# Patient Record
Sex: Female | Born: 1955 | Race: White | Hispanic: No | Smoking: Former smoker
Health system: Southern US, Community
[De-identification: ages and names within clinical notes are randomized; demographics above are authoritative.]

## PROBLEM LIST (undated history)

## (undated) DIAGNOSIS — E785 Hyperlipidemia, unspecified: Secondary | ICD-10-CM

## (undated) DIAGNOSIS — Z8049 Family history of malignant neoplasm of other genital organs: Secondary | ICD-10-CM

## (undated) DIAGNOSIS — J449 Chronic obstructive pulmonary disease, unspecified: Secondary | ICD-10-CM

## (undated) DIAGNOSIS — E669 Obesity, unspecified: Secondary | ICD-10-CM

## (undated) DIAGNOSIS — C50919 Malignant neoplasm of unspecified site of unspecified female breast: Secondary | ICD-10-CM

## (undated) DIAGNOSIS — R51 Headache: Secondary | ICD-10-CM

## (undated) DIAGNOSIS — Z923 Personal history of irradiation: Secondary | ICD-10-CM

## (undated) DIAGNOSIS — E032 Hypothyroidism due to medicaments and other exogenous substances: Secondary | ICD-10-CM

## (undated) DIAGNOSIS — I5022 Chronic systolic (congestive) heart failure: Secondary | ICD-10-CM

## (undated) DIAGNOSIS — F329 Major depressive disorder, single episode, unspecified: Secondary | ICD-10-CM

## (undated) DIAGNOSIS — Z95810 Presence of automatic (implantable) cardiac defibrillator: Secondary | ICD-10-CM

## (undated) DIAGNOSIS — Z800 Family history of malignant neoplasm of digestive organs: Secondary | ICD-10-CM

## (undated) DIAGNOSIS — C50312 Malignant neoplasm of lower-inner quadrant of left female breast: Secondary | ICD-10-CM

## (undated) DIAGNOSIS — E119 Type 2 diabetes mellitus without complications: Secondary | ICD-10-CM

## (undated) DIAGNOSIS — K3184 Gastroparesis: Secondary | ICD-10-CM

## (undated) DIAGNOSIS — K219 Gastro-esophageal reflux disease without esophagitis: Secondary | ICD-10-CM

## (undated) DIAGNOSIS — I499 Cardiac arrhythmia, unspecified: Secondary | ICD-10-CM

## (undated) DIAGNOSIS — K589 Irritable bowel syndrome without diarrhea: Secondary | ICD-10-CM

## (undated) DIAGNOSIS — I219 Acute myocardial infarction, unspecified: Secondary | ICD-10-CM

## (undated) DIAGNOSIS — F32A Depression, unspecified: Secondary | ICD-10-CM

## (undated) DIAGNOSIS — I4819 Other persistent atrial fibrillation: Secondary | ICD-10-CM

## (undated) DIAGNOSIS — Z853 Personal history of malignant neoplasm of breast: Secondary | ICD-10-CM

## (undated) DIAGNOSIS — M1990 Unspecified osteoarthritis, unspecified site: Secondary | ICD-10-CM

## (undated) DIAGNOSIS — R519 Headache, unspecified: Secondary | ICD-10-CM

## (undated) DIAGNOSIS — L0292 Furuncle, unspecified: Secondary | ICD-10-CM

## (undated) DIAGNOSIS — I10 Essential (primary) hypertension: Secondary | ICD-10-CM

## (undated) DIAGNOSIS — R0600 Dyspnea, unspecified: Secondary | ICD-10-CM

## (undated) DIAGNOSIS — G2581 Restless legs syndrome: Secondary | ICD-10-CM

## (undated) DIAGNOSIS — Z950 Presence of cardiac pacemaker: Secondary | ICD-10-CM

## (undated) DIAGNOSIS — F419 Anxiety disorder, unspecified: Secondary | ICD-10-CM

## (undated) DIAGNOSIS — I509 Heart failure, unspecified: Secondary | ICD-10-CM

## (undated) DIAGNOSIS — G4733 Obstructive sleep apnea (adult) (pediatric): Secondary | ICD-10-CM

## (undated) DIAGNOSIS — R2232 Localized swelling, mass and lump, left upper limb: Principal | ICD-10-CM

## (undated) DIAGNOSIS — Z803 Family history of malignant neoplasm of breast: Secondary | ICD-10-CM

## (undated) HISTORY — DX: Hypothyroidism due to medicaments and other exogenous substances: E03.2

## (undated) HISTORY — PX: WRIST SURGERY: SHX841

## (undated) HISTORY — DX: Major depressive disorder, single episode, unspecified: F32.9

## (undated) HISTORY — DX: Furuncle, unspecified: L02.92

## (undated) HISTORY — DX: Anxiety disorder, unspecified: F41.9

## (undated) HISTORY — DX: Unspecified osteoarthritis, unspecified site: M19.90

## (undated) HISTORY — DX: Chronic systolic (congestive) heart failure: I50.22

## (undated) HISTORY — PX: CHOLECYSTECTOMY: SHX55

## (undated) HISTORY — DX: Hyperlipidemia, unspecified: E78.5

## (undated) HISTORY — DX: Depression, unspecified: F32.A

## (undated) HISTORY — DX: Obesity, unspecified: E66.9

## (undated) HISTORY — DX: Irritable bowel syndrome, unspecified: K58.9

## (undated) HISTORY — DX: Family history of malignant neoplasm of breast: Z80.3

## (undated) HISTORY — PX: EYE SURGERY: SHX253

## (undated) HISTORY — PX: MASTECTOMY: SHX3

## (undated) HISTORY — DX: Personal history of irradiation: Z92.3

## (undated) HISTORY — DX: Gastroparesis: K31.84

## (undated) HISTORY — DX: Essential (primary) hypertension: I10

## (undated) HISTORY — DX: Personal history of malignant neoplasm of breast: Z85.3

## (undated) HISTORY — DX: Malignant neoplasm of lower-inner quadrant of left female breast: C50.312

## (undated) HISTORY — DX: Family history of malignant neoplasm of other genital organs: Z80.49

## (undated) HISTORY — DX: Localized swelling, mass and lump, left upper limb: R22.32

## (undated) HISTORY — DX: Heart failure, unspecified: I50.9

## (undated) HISTORY — DX: Family history of malignant neoplasm of digestive organs: Z80.0

## (undated) HISTORY — DX: Type 2 diabetes mellitus without complications: E11.9

## (undated) HISTORY — DX: Obstructive sleep apnea (adult) (pediatric): G47.33

## (undated) HISTORY — PX: KNEE CARTILAGE SURGERY: SHX688

## (undated) HISTORY — DX: Restless legs syndrome: G25.81

## (undated) HISTORY — DX: Other persistent atrial fibrillation: I48.19

## (undated) HISTORY — DX: Gastro-esophageal reflux disease without esophagitis: K21.9

---

## 1989-08-05 HISTORY — PX: ABDOMINAL HYSTERECTOMY: SHX81

## 1996-08-05 LAB — HM COLONOSCOPY: HM Colonoscopy: NORMAL

## 1999-11-20 ENCOUNTER — Emergency Department (HOSPITAL_COMMUNITY): Admission: EM | Admit: 1999-11-20 | Discharge: 1999-11-20 | Payer: Self-pay | Admitting: Emergency Medicine

## 1999-11-20 ENCOUNTER — Encounter: Payer: Self-pay | Admitting: Emergency Medicine

## 2001-04-26 ENCOUNTER — Emergency Department (HOSPITAL_COMMUNITY): Admission: EM | Admit: 2001-04-26 | Discharge: 2001-04-26 | Payer: Self-pay | Admitting: Emergency Medicine

## 2001-05-23 ENCOUNTER — Emergency Department (HOSPITAL_COMMUNITY): Admission: EM | Admit: 2001-05-23 | Discharge: 2001-05-23 | Payer: Self-pay | Admitting: Emergency Medicine

## 2001-05-29 ENCOUNTER — Ambulatory Visit (HOSPITAL_COMMUNITY): Admission: RE | Admit: 2001-05-29 | Discharge: 2001-05-30 | Payer: Self-pay | Admitting: *Deleted

## 2001-05-29 ENCOUNTER — Encounter (INDEPENDENT_AMBULATORY_CARE_PROVIDER_SITE_OTHER): Payer: Self-pay | Admitting: *Deleted

## 2002-07-11 ENCOUNTER — Emergency Department (HOSPITAL_COMMUNITY): Admission: EM | Admit: 2002-07-11 | Discharge: 2002-07-11 | Payer: Self-pay | Admitting: Emergency Medicine

## 2002-07-11 ENCOUNTER — Encounter: Payer: Self-pay | Admitting: Emergency Medicine

## 2002-08-11 ENCOUNTER — Other Ambulatory Visit: Admission: RE | Admit: 2002-08-11 | Discharge: 2002-08-11 | Payer: Self-pay | Admitting: Obstetrics and Gynecology

## 2004-05-28 ENCOUNTER — Other Ambulatory Visit: Admission: RE | Admit: 2004-05-28 | Discharge: 2004-05-28 | Payer: Self-pay

## 2004-06-13 ENCOUNTER — Ambulatory Visit (HOSPITAL_COMMUNITY): Admission: RE | Admit: 2004-06-13 | Discharge: 2004-06-13 | Payer: Self-pay

## 2004-06-13 ENCOUNTER — Encounter (INDEPENDENT_AMBULATORY_CARE_PROVIDER_SITE_OTHER): Payer: Self-pay | Admitting: *Deleted

## 2004-06-13 ENCOUNTER — Encounter (INDEPENDENT_AMBULATORY_CARE_PROVIDER_SITE_OTHER): Payer: Self-pay

## 2004-06-15 ENCOUNTER — Ambulatory Visit: Payer: Self-pay | Admitting: Oncology

## 2004-07-02 ENCOUNTER — Encounter (HOSPITAL_COMMUNITY): Admission: RE | Admit: 2004-07-02 | Discharge: 2004-09-30 | Payer: Self-pay | Admitting: Oncology

## 2004-07-04 ENCOUNTER — Ambulatory Visit (HOSPITAL_COMMUNITY): Admission: RE | Admit: 2004-07-04 | Discharge: 2004-07-04 | Payer: Self-pay | Admitting: Oncology

## 2004-07-04 ENCOUNTER — Ambulatory Visit: Payer: Self-pay

## 2004-07-05 ENCOUNTER — Encounter: Admission: RE | Admit: 2004-07-05 | Discharge: 2004-07-05 | Payer: Self-pay | Admitting: Oncology

## 2004-07-10 ENCOUNTER — Ambulatory Visit: Admission: RE | Admit: 2004-07-10 | Discharge: 2004-08-10 | Payer: Self-pay | Admitting: Radiation Oncology

## 2004-07-20 ENCOUNTER — Ambulatory Visit (HOSPITAL_COMMUNITY): Admission: RE | Admit: 2004-07-20 | Discharge: 2004-07-20 | Payer: Self-pay

## 2004-08-02 ENCOUNTER — Ambulatory Visit: Payer: Self-pay | Admitting: Oncology

## 2004-08-05 DIAGNOSIS — Z853 Personal history of malignant neoplasm of breast: Secondary | ICD-10-CM

## 2004-08-05 HISTORY — DX: Personal history of malignant neoplasm of breast: Z85.3

## 2004-09-19 ENCOUNTER — Ambulatory Visit: Payer: Self-pay | Admitting: Oncology

## 2004-11-07 ENCOUNTER — Ambulatory Visit: Payer: Self-pay | Admitting: Oncology

## 2004-11-27 ENCOUNTER — Ambulatory Visit: Admission: RE | Admit: 2004-11-27 | Discharge: 2005-02-21 | Payer: Self-pay | Admitting: Radiation Oncology

## 2004-12-27 ENCOUNTER — Ambulatory Visit: Payer: Self-pay | Admitting: Oncology

## 2005-02-12 ENCOUNTER — Ambulatory Visit: Payer: Self-pay | Admitting: Oncology

## 2005-02-17 ENCOUNTER — Ambulatory Visit: Payer: Self-pay | Admitting: Cardiology

## 2005-02-17 ENCOUNTER — Inpatient Hospital Stay (HOSPITAL_COMMUNITY): Admission: EM | Admit: 2005-02-17 | Discharge: 2005-02-21 | Payer: Self-pay | Admitting: Emergency Medicine

## 2005-02-18 ENCOUNTER — Ambulatory Visit: Payer: Self-pay | Admitting: Cardiology

## 2005-02-18 ENCOUNTER — Encounter: Payer: Self-pay | Admitting: Cardiology

## 2005-02-28 ENCOUNTER — Ambulatory Visit: Payer: Self-pay | Admitting: Internal Medicine

## 2005-03-07 ENCOUNTER — Ambulatory Visit: Payer: Self-pay | Admitting: Internal Medicine

## 2005-03-14 ENCOUNTER — Ambulatory Visit: Payer: Self-pay | Admitting: Internal Medicine

## 2005-03-20 ENCOUNTER — Ambulatory Visit: Payer: Self-pay

## 2005-03-26 ENCOUNTER — Ambulatory Visit: Payer: Self-pay | Admitting: Internal Medicine

## 2005-04-16 ENCOUNTER — Ambulatory Visit: Payer: Self-pay | Admitting: Cardiology

## 2005-05-02 ENCOUNTER — Ambulatory Visit: Payer: Self-pay | Admitting: Cardiology

## 2005-05-15 ENCOUNTER — Ambulatory Visit: Payer: Self-pay | Admitting: Internal Medicine

## 2005-05-22 ENCOUNTER — Ambulatory Visit: Payer: Self-pay | Admitting: Cardiology

## 2005-06-17 ENCOUNTER — Ambulatory Visit: Payer: Self-pay | Admitting: Pulmonary Disease

## 2005-07-01 ENCOUNTER — Ambulatory Visit: Payer: Self-pay | Admitting: Internal Medicine

## 2005-07-10 ENCOUNTER — Ambulatory Visit (HOSPITAL_BASED_OUTPATIENT_CLINIC_OR_DEPARTMENT_OTHER): Admission: RE | Admit: 2005-07-10 | Discharge: 2005-07-10 | Payer: Self-pay | Admitting: Pulmonary Disease

## 2005-07-12 ENCOUNTER — Ambulatory Visit: Payer: Self-pay | Admitting: Oncology

## 2005-07-22 ENCOUNTER — Ambulatory Visit: Payer: Self-pay | Admitting: Pulmonary Disease

## 2005-07-26 ENCOUNTER — Ambulatory Visit: Payer: Self-pay | Admitting: Internal Medicine

## 2005-08-07 ENCOUNTER — Ambulatory Visit: Payer: Self-pay | Admitting: Internal Medicine

## 2005-08-30 ENCOUNTER — Ambulatory Visit: Payer: Self-pay | Admitting: Pulmonary Disease

## 2005-09-03 ENCOUNTER — Ambulatory Visit: Payer: Self-pay | Admitting: Cardiology

## 2005-09-17 ENCOUNTER — Ambulatory Visit: Payer: Self-pay | Admitting: Cardiology

## 2005-09-26 ENCOUNTER — Inpatient Hospital Stay (HOSPITAL_COMMUNITY): Admission: EM | Admit: 2005-09-26 | Discharge: 2005-09-27 | Payer: Self-pay | Admitting: Emergency Medicine

## 2005-10-11 ENCOUNTER — Ambulatory Visit: Payer: Self-pay | Admitting: Cardiology

## 2005-10-14 ENCOUNTER — Ambulatory Visit: Payer: Self-pay | Admitting: Pulmonary Disease

## 2005-11-01 ENCOUNTER — Ambulatory Visit: Payer: Self-pay | Admitting: Oncology

## 2005-11-04 ENCOUNTER — Ambulatory Visit: Payer: Self-pay | Admitting: Cardiology

## 2005-11-04 ENCOUNTER — Ambulatory Visit: Payer: Self-pay

## 2005-11-20 LAB — COMPREHENSIVE METABOLIC PANEL
ALT: 21 U/L (ref 0–40)
AST: 23 U/L (ref 0–37)
Albumin: 4.2 g/dL (ref 3.5–5.2)
Alkaline Phosphatase: 72 U/L (ref 39–117)
Potassium: 4.1 mEq/L (ref 3.5–5.3)
Sodium: 139 mEq/L (ref 135–145)
Total Protein: 7.1 g/dL (ref 6.0–8.3)

## 2005-11-20 LAB — CBC WITH DIFFERENTIAL/PLATELET
BASO%: 0.5 % (ref 0.0–2.0)
EOS%: 2.1 % (ref 0.0–7.0)
Eosinophils Absolute: 0.2 10*3/uL (ref 0.0–0.5)
MCH: 29.1 pg (ref 26.0–34.0)
MCHC: 33.1 g/dL (ref 32.0–36.0)
MCV: 87.8 fL (ref 81.0–101.0)
MONO%: 5.2 % (ref 0.0–13.0)
NEUT#: 5.2 10*3/uL (ref 1.5–6.5)
RBC: 4.43 10*6/uL (ref 3.70–5.32)
RDW: 15.5 % — ABNORMAL HIGH (ref 11.3–14.5)

## 2005-11-29 ENCOUNTER — Ambulatory Visit: Payer: Self-pay

## 2005-12-09 ENCOUNTER — Ambulatory Visit: Payer: Self-pay | Admitting: Internal Medicine

## 2005-12-16 ENCOUNTER — Ambulatory Visit: Payer: Self-pay | Admitting: Internal Medicine

## 2006-01-03 ENCOUNTER — Ambulatory Visit: Payer: Self-pay | Admitting: Cardiology

## 2006-03-10 ENCOUNTER — Ambulatory Visit: Payer: Self-pay | Admitting: Oncology

## 2006-03-12 LAB — CBC WITH DIFFERENTIAL/PLATELET
BASO%: 0.5 % (ref 0.0–2.0)
Basophils Absolute: 0 10*3/uL (ref 0.0–0.1)
EOS%: 2.1 % (ref 0.0–7.0)
MCH: 29.5 pg (ref 26.0–34.0)
MCHC: 34.4 g/dL (ref 32.0–36.0)
MCV: 85.7 fL (ref 81.0–101.0)
MONO%: 5.9 % (ref 0.0–13.0)
RBC: 4.44 10*6/uL (ref 3.70–5.32)
RDW: 15.6 % — ABNORMAL HIGH (ref 11.3–14.5)
lymph#: 1.8 10*3/uL (ref 0.9–3.3)

## 2006-03-12 LAB — COMPREHENSIVE METABOLIC PANEL
ALT: 20 U/L (ref 0–40)
AST: 21 U/L (ref 0–37)
Albumin: 3.8 g/dL (ref 3.5–5.2)
Alkaline Phosphatase: 76 U/L (ref 39–117)
BUN: 17 mg/dL (ref 6–23)
Chloride: 99 mEq/L (ref 96–112)
Potassium: 4.1 mEq/L (ref 3.5–5.3)
Sodium: 138 mEq/L (ref 135–145)

## 2006-06-03 ENCOUNTER — Ambulatory Visit: Payer: Self-pay | Admitting: Pulmonary Disease

## 2006-07-02 ENCOUNTER — Ambulatory Visit: Payer: Self-pay | Admitting: Oncology

## 2006-07-06 ENCOUNTER — Observation Stay (HOSPITAL_COMMUNITY): Admission: EM | Admit: 2006-07-06 | Discharge: 2006-07-06 | Payer: Self-pay | Admitting: Emergency Medicine

## 2006-07-06 ENCOUNTER — Ambulatory Visit: Payer: Self-pay | Admitting: *Deleted

## 2006-07-07 LAB — COMPREHENSIVE METABOLIC PANEL
ALT: 28 U/L (ref 0–35)
AST: 27 U/L (ref 0–37)
Albumin: 4 g/dL (ref 3.5–5.2)
CO2: 23 mEq/L (ref 19–32)
Calcium: 9 mg/dL (ref 8.4–10.5)
Chloride: 104 mEq/L (ref 96–112)
Creatinine, Ser: 0.69 mg/dL (ref 0.40–1.20)
Potassium: 3.8 mEq/L (ref 3.5–5.3)
Total Protein: 6.8 g/dL (ref 6.0–8.3)

## 2006-07-07 LAB — CBC WITH DIFFERENTIAL/PLATELET
BASO%: 0.8 % (ref 0.0–2.0)
Basophils Absolute: 0.1 10*3/uL (ref 0.0–0.1)
EOS%: 2.3 % (ref 0.0–7.0)
HGB: 13.2 g/dL (ref 11.6–15.9)
MCH: 29.6 pg (ref 26.0–34.0)
MCHC: 34.4 g/dL (ref 32.0–36.0)
MCV: 86.2 fL (ref 81.0–101.0)
MONO%: 6.3 % (ref 0.0–13.0)
NEUT%: 58.5 % (ref 39.6–76.8)
RDW: 13.1 % (ref 11.3–14.5)
lymph#: 2.2 10*3/uL (ref 0.9–3.3)

## 2006-07-07 LAB — LACTATE DEHYDROGENASE: LDH: 138 U/L (ref 94–250)

## 2006-07-10 ENCOUNTER — Ambulatory Visit: Payer: Self-pay | Admitting: Cardiology

## 2006-07-15 ENCOUNTER — Encounter: Admission: RE | Admit: 2006-07-15 | Discharge: 2006-07-15 | Payer: Self-pay | Admitting: Oncology

## 2006-07-15 ENCOUNTER — Ambulatory Visit (HOSPITAL_COMMUNITY): Admission: RE | Admit: 2006-07-15 | Discharge: 2006-07-15 | Payer: Self-pay | Admitting: Oncology

## 2006-07-23 ENCOUNTER — Ambulatory Visit: Payer: Self-pay

## 2006-07-31 ENCOUNTER — Encounter: Admission: RE | Admit: 2006-07-31 | Discharge: 2006-07-31 | Payer: Self-pay | Admitting: Oncology

## 2006-08-11 ENCOUNTER — Encounter (INDEPENDENT_AMBULATORY_CARE_PROVIDER_SITE_OTHER): Payer: Self-pay | Admitting: Diagnostic Radiology

## 2006-08-11 ENCOUNTER — Encounter: Admission: RE | Admit: 2006-08-11 | Discharge: 2006-08-11 | Payer: Self-pay | Admitting: Oncology

## 2006-08-11 ENCOUNTER — Encounter (INDEPENDENT_AMBULATORY_CARE_PROVIDER_SITE_OTHER): Payer: Self-pay | Admitting: Specialist

## 2006-08-27 ENCOUNTER — Ambulatory Visit (HOSPITAL_COMMUNITY): Admission: RE | Admit: 2006-08-27 | Discharge: 2006-08-28 | Payer: Self-pay | Admitting: General Surgery

## 2006-08-27 ENCOUNTER — Encounter (INDEPENDENT_AMBULATORY_CARE_PROVIDER_SITE_OTHER): Payer: Self-pay | Admitting: Specialist

## 2006-09-08 ENCOUNTER — Ambulatory Visit: Payer: Self-pay | Admitting: Cardiology

## 2006-09-15 ENCOUNTER — Ambulatory Visit: Payer: Self-pay | Admitting: Oncology

## 2006-09-17 LAB — CBC WITH DIFFERENTIAL/PLATELET
BASO%: 0.8 % (ref 0.0–2.0)
Basophils Absolute: 0.1 10*3/uL (ref 0.0–0.1)
EOS%: 4.7 % (ref 0.0–7.0)
HCT: 34 % — ABNORMAL LOW (ref 34.8–46.6)
HGB: 11.9 g/dL (ref 11.6–15.9)
LYMPH%: 26.9 % (ref 14.0–48.0)
MCH: 29.8 pg (ref 26.0–34.0)
MCHC: 35 g/dL (ref 32.0–36.0)
NEUT%: 61.1 % (ref 39.6–76.8)
Platelets: 326 10*3/uL (ref 145–400)

## 2006-09-17 LAB — COMPREHENSIVE METABOLIC PANEL
AST: 14 U/L (ref 0–37)
Albumin: 3.8 g/dL (ref 3.5–5.2)
Alkaline Phosphatase: 85 U/L (ref 39–117)
BUN: 18 mg/dL (ref 6–23)
Calcium: 9.2 mg/dL (ref 8.4–10.5)
Chloride: 101 mEq/L (ref 96–112)
Glucose, Bld: 154 mg/dL — ABNORMAL HIGH (ref 70–99)
Potassium: 4 mEq/L (ref 3.5–5.3)
Sodium: 138 mEq/L (ref 135–145)
Total Protein: 6.8 g/dL (ref 6.0–8.3)

## 2006-09-17 LAB — LACTATE DEHYDROGENASE: LDH: 106 U/L (ref 94–250)

## 2006-09-29 ENCOUNTER — Ambulatory Visit (HOSPITAL_COMMUNITY): Admission: RE | Admit: 2006-09-29 | Discharge: 2006-09-29 | Payer: Self-pay | Admitting: Oncology

## 2006-09-30 ENCOUNTER — Ambulatory Visit (HOSPITAL_COMMUNITY): Admission: RE | Admit: 2006-09-30 | Discharge: 2006-09-30 | Payer: Self-pay | Admitting: Oncology

## 2006-10-02 LAB — CBC WITH DIFFERENTIAL/PLATELET
BASO%: 0.7 % (ref 0.0–2.0)
EOS%: 4.3 % (ref 0.0–7.0)
HCT: 35.1 % (ref 34.8–46.6)
LYMPH%: 33.9 % (ref 14.0–48.0)
MCH: 29.2 pg (ref 26.0–34.0)
MCHC: 34.5 g/dL (ref 32.0–36.0)
MONO#: 0.5 10*3/uL (ref 0.1–0.9)
NEUT%: 53.6 % (ref 39.6–76.8)
Platelets: 301 10*3/uL (ref 145–400)
RBC: 4.14 10*6/uL (ref 3.70–5.32)
WBC: 6.8 10*3/uL (ref 3.9–10.0)

## 2006-10-15 LAB — CBC WITH DIFFERENTIAL/PLATELET
BASO%: 0.5 % (ref 0.0–2.0)
EOS%: 2.6 % (ref 0.0–7.0)
MCH: 28.7 pg (ref 26.0–34.0)
MCHC: 34.1 g/dL (ref 32.0–36.0)
MONO#: 0.5 10*3/uL (ref 0.1–0.9)
NEUT%: 63.5 % (ref 39.6–76.8)
RBC: 4.07 10*6/uL (ref 3.70–5.32)
RDW: 14.9 % — ABNORMAL HIGH (ref 11.3–14.5)
WBC: 7.8 10*3/uL (ref 3.9–10.0)
lymph#: 2.1 10*3/uL (ref 0.9–3.3)

## 2006-10-23 ENCOUNTER — Ambulatory Visit: Payer: Self-pay | Admitting: Cardiology

## 2006-10-23 LAB — CONVERTED CEMR LAB
BUN: 11 mg/dL (ref 6–23)
CO2: 31 meq/L (ref 19–32)
Chloride: 99 meq/L (ref 96–112)
Creatinine, Ser: 0.6 mg/dL (ref 0.4–1.2)
GFR calc Af Amer: 136 mL/min
GFR calc non Af Amer: 112 mL/min
Glucose, Bld: 114 mg/dL — ABNORMAL HIGH (ref 70–99)
Potassium: 3.7 meq/L (ref 3.5–5.1)
Pro B Natriuretic peptide (BNP): 78 pg/mL (ref 0.0–100.0)
Sodium: 136 meq/L (ref 135–145)

## 2006-10-24 LAB — WOUND CULTURE

## 2006-10-29 ENCOUNTER — Ambulatory Visit: Payer: Self-pay | Admitting: Oncology

## 2006-11-04 LAB — CBC WITH DIFFERENTIAL/PLATELET
Basophils Absolute: 0 10*3/uL (ref 0.0–0.1)
Eosinophils Absolute: 0.2 10*3/uL (ref 0.0–0.5)
HCT: 35.1 % (ref 34.8–46.6)
HGB: 12.1 g/dL (ref 11.6–15.9)
LYMPH%: 23.1 % (ref 14.0–48.0)
MCV: 83.5 fL (ref 81.0–101.0)
MONO#: 0.5 10*3/uL (ref 0.1–0.9)
MONO%: 6.6 % (ref 0.0–13.0)
NEUT#: 5 10*3/uL (ref 1.5–6.5)
Platelets: 248 10*3/uL (ref 145–400)
RBC: 4.2 10*6/uL (ref 3.70–5.32)
WBC: 7.3 10*3/uL (ref 3.9–10.0)

## 2006-12-03 LAB — COMPREHENSIVE METABOLIC PANEL
Alkaline Phosphatase: 86 U/L (ref 39–117)
CO2: 24 mEq/L (ref 19–32)
Creatinine, Ser: 0.55 mg/dL (ref 0.40–1.20)
Glucose, Bld: 128 mg/dL — ABNORMAL HIGH (ref 70–99)
Total Bilirubin: 0.4 mg/dL (ref 0.3–1.2)

## 2006-12-03 LAB — CBC WITH DIFFERENTIAL/PLATELET
BASO%: 0.7 % (ref 0.0–2.0)
Eosinophils Absolute: 0.2 10*3/uL (ref 0.0–0.5)
HCT: 34.8 % (ref 34.8–46.6)
LYMPH%: 31 % (ref 14.0–48.0)
MCHC: 34.4 g/dL (ref 32.0–36.0)
MCV: 82.8 fL (ref 81.0–101.0)
MONO#: 0.7 10*3/uL (ref 0.1–0.9)
MONO%: 8.4 % (ref 0.0–13.0)
NEUT%: 57.7 % (ref 39.6–76.8)
Platelets: 270 10*3/uL (ref 145–400)
WBC: 8.1 10*3/uL (ref 3.9–10.0)

## 2006-12-03 LAB — CANCER ANTIGEN 27.29: CA 27.29: 5 U/mL (ref 0–39)

## 2006-12-03 LAB — LACTATE DEHYDROGENASE: LDH: 108 U/L (ref 94–250)

## 2006-12-22 ENCOUNTER — Ambulatory Visit: Payer: Self-pay | Admitting: Internal Medicine

## 2006-12-23 LAB — CONVERTED CEMR LAB
BUN: 14 mg/dL (ref 6–23)
CO2: 31 meq/L (ref 19–32)
Calcium: 9.4 mg/dL (ref 8.4–10.5)
Eosinophils Absolute: 0.2 10*3/uL (ref 0.0–0.6)
Free T4: 0.6 ng/dL (ref 0.6–1.6)
GFR calc Af Amer: 113 mL/min
GFR calc non Af Amer: 94 mL/min
HCT: 37.7 % (ref 36.0–46.0)
Hemoglobin: 12.7 g/dL (ref 12.0–15.0)
Lymphocytes Relative: 29.6 % (ref 12.0–46.0)
MCHC: 33.7 g/dL (ref 30.0–36.0)
Monocytes Relative: 3.5 % (ref 3.0–11.0)
Neutro Abs: 5.2 10*3/uL (ref 1.4–7.7)
Neutrophils Relative %: 63.7 % (ref 43.0–77.0)
Pro B Natriuretic peptide (BNP): 40 pg/mL (ref 0.0–100.0)
RBC: 4.45 M/uL (ref 3.87–5.11)
RDW: 16.4 % — ABNORMAL HIGH (ref 11.5–14.6)
Sodium: 139 meq/L (ref 135–145)
WBC: 8.1 10*3/uL (ref 4.5–10.5)

## 2007-01-09 ENCOUNTER — Encounter: Payer: Self-pay | Admitting: Internal Medicine

## 2007-01-09 ENCOUNTER — Ambulatory Visit: Payer: Self-pay

## 2007-01-23 ENCOUNTER — Ambulatory Visit: Payer: Self-pay | Admitting: Internal Medicine

## 2007-02-26 ENCOUNTER — Ambulatory Visit: Payer: Self-pay | Admitting: Oncology

## 2007-03-02 LAB — COMPREHENSIVE METABOLIC PANEL
Albumin: 3.8 g/dL (ref 3.5–5.2)
BUN: 13 mg/dL (ref 6–23)
CO2: 23 mEq/L (ref 19–32)
Calcium: 9.1 mg/dL (ref 8.4–10.5)
Chloride: 99 mEq/L (ref 96–112)
Glucose, Bld: 330 mg/dL — ABNORMAL HIGH (ref 70–99)
Potassium: 4.1 mEq/L (ref 3.5–5.3)
Sodium: 134 mEq/L — ABNORMAL LOW (ref 135–145)
Total Protein: 6.8 g/dL (ref 6.0–8.3)

## 2007-03-02 LAB — CBC WITH DIFFERENTIAL/PLATELET
Basophils Absolute: 0 10*3/uL (ref 0.0–0.1)
Eosinophils Absolute: 0.1 10*3/uL (ref 0.0–0.5)
HGB: 11.7 g/dL (ref 11.6–15.9)
MCV: 83.9 fL (ref 81.0–101.0)
MONO#: 0.3 10*3/uL (ref 0.1–0.9)
MONO%: 5.4 % (ref 0.0–13.0)
NEUT#: 3.5 10*3/uL (ref 1.5–6.5)
RBC: 4.09 10*6/uL (ref 3.70–5.32)
RDW: 16.3 % — ABNORMAL HIGH (ref 11.3–14.5)
WBC: 5.4 10*3/uL (ref 3.9–10.0)
lymph#: 1.5 10*3/uL (ref 0.9–3.3)

## 2007-03-02 LAB — CANCER ANTIGEN 27.29: CA 27.29: 6 U/mL (ref 0–39)

## 2007-03-02 LAB — LACTATE DEHYDROGENASE: LDH: 116 U/L (ref 94–250)

## 2007-03-05 ENCOUNTER — Ambulatory Visit: Payer: Self-pay | Admitting: Internal Medicine

## 2007-03-05 LAB — CONVERTED CEMR LAB
GFR calc Af Amer: 136 mL/min
GFR calc non Af Amer: 112 mL/min
Sodium: 135 meq/L (ref 135–145)

## 2007-03-11 ENCOUNTER — Ambulatory Visit: Payer: Self-pay

## 2007-03-11 ENCOUNTER — Ambulatory Visit: Payer: Self-pay | Admitting: Internal Medicine

## 2007-03-11 LAB — CONVERTED CEMR LAB
BUN: 10 mg/dL (ref 6–23)
BUN: 11 mg/dL (ref 6–23)
CO2: 26 meq/L (ref 19–32)
CO2: 27 meq/L (ref 19–32)
Calcium: 9.2 mg/dL (ref 8.4–10.5)
Calcium: 10 mg/dL (ref 8.4–10.5)
Creatinine, Ser: 0.6 mg/dL (ref 0.4–1.2)
Creatinine, Ser: 0.6 mg/dL (ref 0.4–1.2)
GFR calc Af Amer: 136 mL/min
Glucose, Bld: 248 mg/dL — ABNORMAL HIGH (ref 70–99)
Pro B Natriuretic peptide (BNP): 104 pg/mL — ABNORMAL HIGH (ref 0.0–100.0)
Sodium: 135 meq/L (ref 135–145)
Sodium: 143 meq/L (ref 135–145)
TSH: 4.08 microintl units/mL (ref 0.35–5.50)

## 2007-03-17 ENCOUNTER — Ambulatory Visit: Payer: Self-pay | Admitting: Oncology

## 2007-04-21 ENCOUNTER — Ambulatory Visit: Payer: Self-pay | Admitting: Internal Medicine

## 2007-04-29 LAB — COMPREHENSIVE METABOLIC PANEL
AST: 39 U/L — ABNORMAL HIGH (ref 0–37)
BUN: 13 mg/dL (ref 6–23)
Calcium: 9.3 mg/dL (ref 8.4–10.5)
Chloride: 101 mEq/L (ref 96–112)
Creatinine, Ser: 0.75 mg/dL (ref 0.40–1.20)
Total Bilirubin: 0.4 mg/dL (ref 0.3–1.2)

## 2007-04-29 LAB — CBC WITH DIFFERENTIAL/PLATELET
BASO%: 0.4 % (ref 0.0–2.0)
EOS%: 2.5 % (ref 0.0–7.0)
HCT: 36.1 % (ref 34.8–46.6)
MCH: 28.9 pg (ref 26.0–34.0)
MCHC: 34.5 g/dL (ref 32.0–36.0)
MONO#: 0.4 10*3/uL (ref 0.1–0.9)
NEUT%: 63.8 % (ref 39.6–76.8)
RBC: 4.3 10*6/uL (ref 3.70–5.32)
WBC: 6.9 10*3/uL (ref 3.9–10.0)
lymph#: 1.9 10*3/uL (ref 0.9–3.3)

## 2007-06-22 ENCOUNTER — Ambulatory Visit: Payer: Self-pay | Admitting: Cardiology

## 2007-06-25 ENCOUNTER — Ambulatory Visit (HOSPITAL_COMMUNITY): Admission: RE | Admit: 2007-06-25 | Discharge: 2007-06-25 | Payer: Self-pay | Admitting: Oncology

## 2007-06-25 ENCOUNTER — Ambulatory Visit: Payer: Self-pay | Admitting: Oncology

## 2007-06-29 LAB — CBC WITH DIFFERENTIAL/PLATELET
Eosinophils Absolute: 0.2 10*3/uL (ref 0.0–0.5)
HGB: 12.5 g/dL (ref 11.6–15.9)
LYMPH%: 24.6 % (ref 14.0–48.0)
MONO#: 0.4 10*3/uL (ref 0.1–0.9)
NEUT#: 5.2 10*3/uL (ref 1.5–6.5)
Platelets: 290 10*3/uL (ref 145–400)
RBC: 4.37 10*6/uL (ref 3.70–5.32)
RDW: 15.9 % — ABNORMAL HIGH (ref 11.3–14.5)
WBC: 7.7 10*3/uL (ref 3.9–10.0)

## 2007-06-29 LAB — COMPREHENSIVE METABOLIC PANEL
Albumin: 4.1 g/dL (ref 3.5–5.2)
CO2: 24 mEq/L (ref 19–32)
Glucose, Bld: 204 mg/dL — ABNORMAL HIGH (ref 70–99)
Potassium: 4 mEq/L (ref 3.5–5.3)
Sodium: 136 mEq/L (ref 135–145)
Total Protein: 7.2 g/dL (ref 6.0–8.3)

## 2007-06-29 LAB — LACTATE DEHYDROGENASE: LDH: 125 U/L (ref 94–250)

## 2007-07-24 ENCOUNTER — Encounter: Admission: RE | Admit: 2007-07-24 | Discharge: 2007-07-24 | Payer: Self-pay | Admitting: Oncology

## 2007-07-24 DIAGNOSIS — E669 Obesity, unspecified: Secondary | ICD-10-CM

## 2007-07-24 DIAGNOSIS — G4733 Obstructive sleep apnea (adult) (pediatric): Secondary | ICD-10-CM

## 2007-07-24 DIAGNOSIS — G2581 Restless legs syndrome: Secondary | ICD-10-CM | POA: Insufficient documentation

## 2007-07-24 HISTORY — DX: Obstructive sleep apnea (adult) (pediatric): G47.33

## 2007-07-24 HISTORY — DX: Obesity, unspecified: E66.9

## 2007-07-24 HISTORY — DX: Restless legs syndrome: G25.81

## 2007-08-10 ENCOUNTER — Ambulatory Visit: Payer: Self-pay | Admitting: Cardiology

## 2007-08-10 LAB — CONVERTED CEMR LAB
BUN: 13 mg/dL (ref 6–23)
CO2: 30 meq/L (ref 19–32)
Calcium: 9.2 mg/dL (ref 8.4–10.5)
Chloride: 101 meq/L (ref 96–112)
Creatinine, Ser: 0.5 mg/dL (ref 0.4–1.2)
GFR calc Af Amer: 167 mL/min
GFR calc non Af Amer: 138 mL/min
Glucose, Bld: 221 mg/dL — ABNORMAL HIGH (ref 70–99)
Pro B Natriuretic peptide (BNP): 134 pg/mL — ABNORMAL HIGH (ref 0.0–100.0)

## 2007-08-13 ENCOUNTER — Ambulatory Visit: Payer: Self-pay | Admitting: Endocrinology

## 2007-08-13 DIAGNOSIS — E119 Type 2 diabetes mellitus without complications: Secondary | ICD-10-CM

## 2007-08-13 HISTORY — DX: Type 2 diabetes mellitus without complications: E11.9

## 2007-09-07 ENCOUNTER — Ambulatory Visit: Payer: Self-pay | Admitting: Internal Medicine

## 2007-09-16 ENCOUNTER — Ambulatory Visit: Payer: Self-pay | Admitting: Endocrinology

## 2007-09-18 ENCOUNTER — Ambulatory Visit: Payer: Self-pay | Admitting: Pulmonary Disease

## 2007-10-08 ENCOUNTER — Ambulatory Visit: Payer: Self-pay | Admitting: Internal Medicine

## 2007-10-08 LAB — CONVERTED CEMR LAB
Basophils Relative: 0.7 % (ref 0.0–1.0)
CO2: 32 meq/L (ref 19–32)
Calcium: 9.6 mg/dL (ref 8.4–10.5)
Creatinine, Ser: 0.7 mg/dL (ref 0.4–1.2)
Eosinophils Absolute: 0.2 10*3/uL (ref 0.0–0.6)
GFR calc non Af Amer: 93 mL/min
Glucose, Bld: 172 mg/dL — ABNORMAL HIGH (ref 70–99)
Hemoglobin: 12.2 g/dL (ref 12.0–15.0)
MCHC: 33.1 g/dL (ref 30.0–36.0)
Neutrophils Relative %: 65.6 % (ref 43.0–77.0)
Potassium: 3.7 meq/L (ref 3.5–5.1)
Prothrombin Time: 11.8 s (ref 10.9–13.3)
RBC: 4.41 M/uL (ref 3.87–5.11)
RDW: 15.6 % — ABNORMAL HIGH (ref 11.5–14.6)
Sodium: 138 meq/L (ref 135–145)

## 2007-10-09 ENCOUNTER — Inpatient Hospital Stay (HOSPITAL_BASED_OUTPATIENT_CLINIC_OR_DEPARTMENT_OTHER): Admission: RE | Admit: 2007-10-09 | Discharge: 2007-10-09 | Payer: Self-pay | Admitting: Internal Medicine

## 2007-10-09 ENCOUNTER — Ambulatory Visit: Payer: Self-pay | Admitting: Internal Medicine

## 2007-10-13 ENCOUNTER — Ambulatory Visit: Payer: Self-pay | Admitting: Oncology

## 2007-10-13 ENCOUNTER — Ambulatory Visit: Payer: Self-pay | Admitting: Internal Medicine

## 2007-10-13 LAB — CONVERTED CEMR LAB
BUN: 9 mg/dL (ref 6–23)
CO2: 31 meq/L (ref 19–32)
GFR calc Af Amer: 113 mL/min
Glucose, Bld: 154 mg/dL — ABNORMAL HIGH (ref 70–99)

## 2007-10-16 ENCOUNTER — Ambulatory Visit: Payer: Self-pay | Admitting: Endocrinology

## 2007-10-20 ENCOUNTER — Ambulatory Visit: Payer: Self-pay | Admitting: Cardiology

## 2007-10-27 ENCOUNTER — Encounter: Payer: Self-pay | Admitting: Pulmonary Disease

## 2007-11-03 LAB — CBC WITH DIFFERENTIAL/PLATELET
Basophils Absolute: 0 10*3/uL (ref 0.0–0.1)
EOS%: 2.6 % (ref 0.0–7.0)
Eosinophils Absolute: 0.2 10*3/uL (ref 0.0–0.5)
HCT: 35.3 % (ref 34.8–46.6)
HGB: 12.1 g/dL (ref 11.6–15.9)
MCH: 28.3 pg (ref 26.0–34.0)
MONO#: 0.5 10*3/uL (ref 0.1–0.9)
NEUT#: 5.5 10*3/uL (ref 1.5–6.5)
NEUT%: 66.5 % (ref 39.6–76.8)
RDW: 16.6 % — ABNORMAL HIGH (ref 11.3–14.5)
WBC: 8.3 10*3/uL (ref 3.9–10.0)
lymph#: 2 10*3/uL (ref 0.9–3.3)

## 2007-11-04 LAB — COMPREHENSIVE METABOLIC PANEL
AST: 21 U/L (ref 0–37)
Albumin: 4.2 g/dL (ref 3.5–5.2)
BUN: 16 mg/dL (ref 6–23)
CO2: 23 mEq/L (ref 19–32)
Calcium: 9.7 mg/dL (ref 8.4–10.5)
Chloride: 100 mEq/L (ref 96–112)
Creatinine, Ser: 0.87 mg/dL (ref 0.40–1.20)
Glucose, Bld: 306 mg/dL — ABNORMAL HIGH (ref 70–99)
Potassium: 4.3 mEq/L (ref 3.5–5.3)

## 2007-11-04 LAB — LACTATE DEHYDROGENASE: LDH: 132 U/L (ref 94–250)

## 2007-11-04 LAB — VITAMIN D 25 HYDROXY (VIT D DEFICIENCY, FRACTURES): Vit D, 25-Hydroxy: 20 ng/mL — ABNORMAL LOW (ref 30–89)

## 2007-11-09 LAB — VITAMIN D 1,25 DIHYDROXY: Vit D, 1,25-Dihydroxy: 47 pg/mL (ref 15–75)

## 2007-11-25 ENCOUNTER — Ambulatory Visit: Payer: Self-pay | Admitting: Endocrinology

## 2007-11-25 LAB — CONVERTED CEMR LAB: Hgb A1c MFr Bld: 7.8 % — ABNORMAL HIGH (ref 4.6–6.0)

## 2007-11-26 ENCOUNTER — Ambulatory Visit: Payer: Self-pay | Admitting: Internal Medicine

## 2008-01-11 ENCOUNTER — Ambulatory Visit: Payer: Self-pay | Admitting: Cardiology

## 2008-01-13 ENCOUNTER — Encounter: Payer: Self-pay | Admitting: Pulmonary Disease

## 2008-01-28 ENCOUNTER — Ambulatory Visit: Payer: Self-pay | Admitting: Endocrinology

## 2008-02-11 ENCOUNTER — Telehealth (INDEPENDENT_AMBULATORY_CARE_PROVIDER_SITE_OTHER): Payer: Self-pay | Admitting: *Deleted

## 2008-02-11 ENCOUNTER — Ambulatory Visit: Payer: Self-pay | Admitting: Endocrinology

## 2008-02-23 ENCOUNTER — Encounter: Payer: Self-pay | Admitting: Endocrinology

## 2008-02-25 ENCOUNTER — Telehealth (INDEPENDENT_AMBULATORY_CARE_PROVIDER_SITE_OTHER): Payer: Self-pay | Admitting: *Deleted

## 2008-03-28 ENCOUNTER — Ambulatory Visit: Payer: Self-pay | Admitting: Internal Medicine

## 2008-03-28 ENCOUNTER — Ambulatory Visit: Payer: Self-pay | Admitting: Endocrinology

## 2008-04-01 ENCOUNTER — Telehealth (INDEPENDENT_AMBULATORY_CARE_PROVIDER_SITE_OTHER): Payer: Self-pay | Admitting: *Deleted

## 2008-04-04 ENCOUNTER — Telehealth: Payer: Self-pay | Admitting: Endocrinology

## 2008-04-06 ENCOUNTER — Telehealth (INDEPENDENT_AMBULATORY_CARE_PROVIDER_SITE_OTHER): Payer: Self-pay | Admitting: *Deleted

## 2008-04-22 ENCOUNTER — Ambulatory Visit: Payer: Self-pay | Admitting: Oncology

## 2008-04-26 LAB — CBC WITH DIFFERENTIAL/PLATELET
Basophils Absolute: 0 10*3/uL (ref 0.0–0.1)
EOS%: 2.7 % (ref 0.0–7.0)
LYMPH%: 27.5 % (ref 14.0–48.0)
MCH: 26.3 pg (ref 26.0–34.0)
MCV: 79.4 fL — ABNORMAL LOW (ref 81.0–101.0)
MONO%: 6.6 % (ref 0.0–13.0)
Platelets: 319 10*3/uL (ref 145–400)
RBC: 4.62 10*6/uL (ref 3.70–5.32)
RDW: 18.3 % — ABNORMAL HIGH (ref 11.3–14.5)

## 2008-04-27 LAB — VITAMIN D 25 HYDROXY (VIT D DEFICIENCY, FRACTURES): Vit D, 25-Hydroxy: 28 ng/mL — ABNORMAL LOW (ref 30–89)

## 2008-04-27 LAB — COMPREHENSIVE METABOLIC PANEL
AST: 18 U/L (ref 0–37)
Albumin: 4.1 g/dL (ref 3.5–5.2)
Alkaline Phosphatase: 77 U/L (ref 39–117)
BUN: 13 mg/dL (ref 6–23)
Glucose, Bld: 134 mg/dL — ABNORMAL HIGH (ref 70–99)
Potassium: 4.1 mEq/L (ref 3.5–5.3)
Sodium: 140 mEq/L (ref 135–145)
Total Bilirubin: 0.4 mg/dL (ref 0.3–1.2)

## 2008-04-27 LAB — CANCER ANTIGEN 27.29: CA 27.29: 13 U/mL (ref 0–39)

## 2008-05-02 ENCOUNTER — Ambulatory Visit: Payer: Self-pay | Admitting: Endocrinology

## 2008-05-03 ENCOUNTER — Encounter: Payer: Self-pay | Admitting: Endocrinology

## 2008-05-10 ENCOUNTER — Encounter (HOSPITAL_COMMUNITY): Admission: RE | Admit: 2008-05-10 | Discharge: 2008-08-04 | Payer: Self-pay | Admitting: Oncology

## 2008-07-26 ENCOUNTER — Ambulatory Visit: Payer: Self-pay | Admitting: Internal Medicine

## 2008-07-26 LAB — CONVERTED CEMR LAB
BUN: 13 mg/dL (ref 6–23)
CO2: 32 meq/L (ref 19–32)
Creatinine, Ser: 0.7 mg/dL (ref 0.4–1.2)
GFR calc Af Amer: 113 mL/min
GFR calc non Af Amer: 93 mL/min
Glucose, Bld: 152 mg/dL — ABNORMAL HIGH (ref 70–99)
Potassium: 3.6 meq/L (ref 3.5–5.1)
Pro B Natriuretic peptide (BNP): 91 pg/mL (ref 0.0–100.0)
Sodium: 137 meq/L (ref 135–145)

## 2008-08-08 DIAGNOSIS — E032 Hypothyroidism due to medicaments and other exogenous substances: Secondary | ICD-10-CM

## 2008-08-08 HISTORY — DX: Hypothyroidism due to medicaments and other exogenous substances: E03.2

## 2008-08-30 ENCOUNTER — Ambulatory Visit: Payer: Self-pay | Admitting: Endocrinology

## 2008-08-31 ENCOUNTER — Telehealth (INDEPENDENT_AMBULATORY_CARE_PROVIDER_SITE_OTHER): Payer: Self-pay | Admitting: *Deleted

## 2008-09-22 ENCOUNTER — Ambulatory Visit: Payer: Self-pay | Admitting: Internal Medicine

## 2008-09-22 ENCOUNTER — Encounter (INDEPENDENT_AMBULATORY_CARE_PROVIDER_SITE_OTHER): Payer: Self-pay | Admitting: Nurse Practitioner

## 2008-10-25 ENCOUNTER — Encounter: Payer: Self-pay | Admitting: Internal Medicine

## 2008-10-25 ENCOUNTER — Ambulatory Visit: Payer: Self-pay | Admitting: Internal Medicine

## 2008-10-25 DIAGNOSIS — R000 Tachycardia, unspecified: Secondary | ICD-10-CM

## 2008-10-28 LAB — CONVERTED CEMR LAB
BUN: 14 mg/dL (ref 6–23)
Calcium: 9.4 mg/dL (ref 8.4–10.5)
Eosinophils Absolute: 0.2 10*3/uL (ref 0.0–0.7)
Glucose, Bld: 158 mg/dL — ABNORMAL HIGH (ref 70–99)
Hemoglobin: 13.4 g/dL (ref 12.0–15.0)
Monocytes Relative: 8.2 % (ref 3.0–12.0)
Neutrophils Relative %: 64.6 % (ref 43.0–77.0)
Potassium: 3.5 meq/L (ref 3.5–5.1)
Sodium: 140 meq/L (ref 135–145)
WBC: 8.3 10*3/uL (ref 4.5–10.5)

## 2008-11-09 ENCOUNTER — Ambulatory Visit: Payer: Self-pay | Admitting: Oncology

## 2008-11-14 LAB — CBC WITH DIFFERENTIAL/PLATELET
Eosinophils Absolute: 0.1 10*3/uL (ref 0.0–0.5)
MONO#: 0.4 10*3/uL (ref 0.1–0.9)
NEUT#: 4.7 10*3/uL (ref 1.5–6.5)
Platelets: 272 10*3/uL (ref 145–400)
RBC: 4.72 10*6/uL (ref 3.70–5.45)
RDW: 15.7 % — ABNORMAL HIGH (ref 11.2–14.5)
WBC: 7.3 10*3/uL (ref 3.9–10.3)
lymph#: 2 10*3/uL (ref 0.9–3.3)

## 2008-11-15 LAB — COMPREHENSIVE METABOLIC PANEL
ALT: 14 U/L (ref 0–35)
Albumin: 4.3 g/dL (ref 3.5–5.2)
CO2: 27 mEq/L (ref 19–32)
Chloride: 98 mEq/L (ref 96–112)
Glucose, Bld: 210 mg/dL — ABNORMAL HIGH (ref 70–99)
Potassium: 3.8 mEq/L (ref 3.5–5.3)
Sodium: 137 mEq/L (ref 135–145)
Total Protein: 7.4 g/dL (ref 6.0–8.3)

## 2008-11-15 LAB — LACTATE DEHYDROGENASE: LDH: 119 U/L (ref 94–250)

## 2008-11-15 LAB — CANCER ANTIGEN 27.29: CA 27.29: 9 U/mL (ref 0–39)

## 2008-11-28 ENCOUNTER — Encounter: Payer: Self-pay | Admitting: Endocrinology

## 2008-11-28 ENCOUNTER — Encounter: Payer: Self-pay | Admitting: Internal Medicine

## 2008-11-29 ENCOUNTER — Ambulatory Visit: Payer: Self-pay | Admitting: Endocrinology

## 2008-11-29 DIAGNOSIS — M79609 Pain in unspecified limb: Secondary | ICD-10-CM

## 2008-12-07 ENCOUNTER — Ambulatory Visit: Payer: Self-pay | Admitting: Cardiovascular Disease

## 2008-12-13 ENCOUNTER — Telehealth: Payer: Self-pay | Admitting: Internal Medicine

## 2008-12-16 ENCOUNTER — Encounter: Payer: Self-pay | Admitting: Endocrinology

## 2008-12-16 ENCOUNTER — Ambulatory Visit: Payer: Self-pay

## 2008-12-26 ENCOUNTER — Encounter: Payer: Self-pay | Admitting: Internal Medicine

## 2008-12-26 ENCOUNTER — Ambulatory Visit: Payer: Self-pay

## 2009-01-05 ENCOUNTER — Ambulatory Visit: Payer: Self-pay | Admitting: Internal Medicine

## 2009-01-05 DIAGNOSIS — I5022 Chronic systolic (congestive) heart failure: Secondary | ICD-10-CM

## 2009-01-05 DIAGNOSIS — I10 Essential (primary) hypertension: Secondary | ICD-10-CM | POA: Insufficient documentation

## 2009-01-05 HISTORY — DX: Chronic systolic (congestive) heart failure: I50.22

## 2009-01-06 ENCOUNTER — Encounter: Payer: Self-pay | Admitting: Internal Medicine

## 2009-01-15 ENCOUNTER — Encounter: Payer: Self-pay | Admitting: Internal Medicine

## 2009-01-19 ENCOUNTER — Encounter: Payer: Self-pay | Admitting: Internal Medicine

## 2009-01-23 ENCOUNTER — Encounter: Payer: Self-pay | Admitting: Internal Medicine

## 2009-01-25 ENCOUNTER — Encounter: Payer: Self-pay | Admitting: Internal Medicine

## 2009-01-27 ENCOUNTER — Encounter: Payer: Self-pay | Admitting: Internal Medicine

## 2009-02-01 ENCOUNTER — Ambulatory Visit: Payer: Self-pay | Admitting: Internal Medicine

## 2009-02-01 ENCOUNTER — Encounter: Payer: Self-pay | Admitting: Internal Medicine

## 2009-02-01 ENCOUNTER — Encounter (INDEPENDENT_AMBULATORY_CARE_PROVIDER_SITE_OTHER): Payer: Self-pay | Admitting: *Deleted

## 2009-02-08 ENCOUNTER — Ambulatory Visit: Payer: Self-pay | Admitting: Cardiology

## 2009-02-08 ENCOUNTER — Ambulatory Visit (HOSPITAL_COMMUNITY): Admission: RE | Admit: 2009-02-08 | Discharge: 2009-02-08 | Payer: Self-pay | Admitting: Internal Medicine

## 2009-02-09 ENCOUNTER — Encounter: Payer: Self-pay | Admitting: Internal Medicine

## 2009-02-12 ENCOUNTER — Encounter: Payer: Self-pay | Admitting: Internal Medicine

## 2009-02-17 ENCOUNTER — Encounter: Payer: Self-pay | Admitting: Internal Medicine

## 2009-03-04 ENCOUNTER — Encounter: Payer: Self-pay | Admitting: Internal Medicine

## 2009-03-07 ENCOUNTER — Ambulatory Visit: Payer: Self-pay | Admitting: Endocrinology

## 2009-03-07 LAB — CONVERTED CEMR LAB: Hgb A1c MFr Bld: 8.4 % — ABNORMAL HIGH (ref 4.6–6.5)

## 2009-04-05 ENCOUNTER — Encounter: Payer: Self-pay | Admitting: Endocrinology

## 2009-04-11 ENCOUNTER — Encounter: Payer: Self-pay | Admitting: Endocrinology

## 2009-04-12 ENCOUNTER — Telehealth: Payer: Self-pay | Admitting: Internal Medicine

## 2009-05-01 ENCOUNTER — Ambulatory Visit: Payer: Self-pay | Admitting: Endocrinology

## 2009-05-01 DIAGNOSIS — E782 Mixed hyperlipidemia: Secondary | ICD-10-CM | POA: Insufficient documentation

## 2009-05-01 DIAGNOSIS — E785 Hyperlipidemia, unspecified: Secondary | ICD-10-CM

## 2009-05-01 HISTORY — DX: Hyperlipidemia, unspecified: E78.5

## 2009-05-15 ENCOUNTER — Telehealth: Payer: Self-pay | Admitting: Internal Medicine

## 2009-05-16 ENCOUNTER — Telehealth: Payer: Self-pay | Admitting: Endocrinology

## 2009-05-22 ENCOUNTER — Ambulatory Visit: Payer: Self-pay | Admitting: Oncology

## 2009-05-24 LAB — CBC WITH DIFFERENTIAL/PLATELET
Basophils Absolute: 0 10*3/uL (ref 0.0–0.1)
EOS%: 2.1 % (ref 0.0–7.0)
HCT: 38 % (ref 34.8–46.6)
HGB: 13.3 g/dL (ref 11.6–15.9)
MCH: 30 pg (ref 25.1–34.0)
MCV: 86.1 fL (ref 79.5–101.0)
NEUT%: 59.6 % (ref 38.4–76.8)
lymph#: 2.4 10*3/uL (ref 0.9–3.3)

## 2009-05-24 LAB — COMPREHENSIVE METABOLIC PANEL
AST: 19 U/L (ref 0–37)
BUN: 16 mg/dL (ref 6–23)
Calcium: 9.1 mg/dL (ref 8.4–10.5)
Chloride: 97 mEq/L (ref 96–112)
Creatinine, Ser: 0.75 mg/dL (ref 0.40–1.20)

## 2009-05-24 LAB — CANCER ANTIGEN 27.29: CA 27.29: <4 U/mL (ref 0–39)

## 2009-05-24 LAB — LACTATE DEHYDROGENASE: LDH: 133 U/L (ref 94–250)

## 2009-06-05 ENCOUNTER — Ambulatory Visit: Payer: Self-pay | Admitting: Endocrinology

## 2009-06-05 LAB — CONVERTED CEMR LAB
Hgb A1c MFr Bld: 8 % — ABNORMAL HIGH (ref 4.6–6.5)
Microalb Creat Ratio: 4 mg/g (ref 0.0–30.0)
Microalb, Ur: 0.1 mg/dL (ref 0.0–1.9)

## 2009-06-06 ENCOUNTER — Telehealth: Payer: Self-pay | Admitting: Internal Medicine

## 2009-06-16 ENCOUNTER — Telehealth: Payer: Self-pay | Admitting: Internal Medicine

## 2009-07-05 ENCOUNTER — Telehealth: Payer: Self-pay | Admitting: Internal Medicine

## 2009-07-07 ENCOUNTER — Telehealth: Payer: Self-pay | Admitting: Endocrinology

## 2009-07-11 ENCOUNTER — Encounter (INDEPENDENT_AMBULATORY_CARE_PROVIDER_SITE_OTHER): Payer: Self-pay | Admitting: *Deleted

## 2009-07-17 ENCOUNTER — Ambulatory Visit: Payer: Self-pay | Admitting: Endocrinology

## 2009-08-08 ENCOUNTER — Telehealth: Payer: Self-pay | Admitting: Endocrinology

## 2009-09-01 ENCOUNTER — Telehealth: Payer: Self-pay | Admitting: Internal Medicine

## 2009-09-01 ENCOUNTER — Telehealth: Payer: Self-pay | Admitting: Endocrinology

## 2009-09-05 ENCOUNTER — Encounter: Payer: Self-pay | Admitting: Internal Medicine

## 2009-09-12 ENCOUNTER — Telehealth: Payer: Self-pay | Admitting: Internal Medicine

## 2009-09-18 ENCOUNTER — Ambulatory Visit: Payer: Self-pay | Admitting: Internal Medicine

## 2009-10-06 ENCOUNTER — Encounter: Payer: Self-pay | Admitting: Internal Medicine

## 2009-10-19 ENCOUNTER — Telehealth: Payer: Self-pay | Admitting: Internal Medicine

## 2009-10-20 ENCOUNTER — Ambulatory Visit: Payer: Self-pay | Admitting: Internal Medicine

## 2009-10-23 ENCOUNTER — Ambulatory Visit: Payer: Self-pay | Admitting: Endocrinology

## 2009-10-24 LAB — CONVERTED CEMR LAB
CO2: 30 meq/L (ref 19–32)
Calcium: 9.6 mg/dL (ref 8.4–10.5)
Free T4: 0.8 ng/dL (ref 0.6–1.6)
Potassium: 4.1 meq/L (ref 3.5–5.1)
Pro B Natriuretic peptide (BNP): 39 pg/mL (ref 0.0–100.0)
Sodium: 136 meq/L (ref 135–145)

## 2009-11-07 ENCOUNTER — Ambulatory Visit: Payer: Self-pay | Admitting: Internal Medicine

## 2009-11-14 ENCOUNTER — Telehealth (INDEPENDENT_AMBULATORY_CARE_PROVIDER_SITE_OTHER): Payer: Self-pay | Admitting: *Deleted

## 2009-11-20 ENCOUNTER — Ambulatory Visit: Payer: Self-pay | Admitting: Internal Medicine

## 2009-11-22 LAB — CONVERTED CEMR LAB
BUN: 24 mg/dL — ABNORMAL HIGH (ref 6–23)
Calcium: 9.7 mg/dL (ref 8.4–10.5)
Creatinine, Ser: 0.9 mg/dL (ref 0.4–1.2)
Glucose, Bld: 377 mg/dL — ABNORMAL HIGH (ref 70–99)
Potassium: 4.1 meq/L (ref 3.5–5.1)
Sodium: 136 meq/L (ref 135–145)

## 2009-11-27 ENCOUNTER — Telehealth: Payer: Self-pay | Admitting: Endocrinology

## 2009-11-27 ENCOUNTER — Ambulatory Visit: Payer: Self-pay | Admitting: Endocrinology

## 2009-11-28 ENCOUNTER — Ambulatory Visit: Payer: Self-pay | Admitting: Oncology

## 2009-11-29 LAB — CBC WITH DIFFERENTIAL/PLATELET
BASO%: 0.2 % (ref 0.0–2.0)
Basophils Absolute: 0 10*3/uL (ref 0.0–0.1)
HCT: 40.6 % (ref 34.8–46.6)
HGB: 13.8 g/dL (ref 11.6–15.9)
MCH: 29.4 pg (ref 25.1–34.0)
MCHC: 34.1 g/dL (ref 31.5–36.0)
NEUT#: 4.1 10*3/uL (ref 1.5–6.5)
NEUT%: 62.7 % (ref 38.4–76.8)
WBC: 6.6 10*3/uL (ref 3.9–10.3)
lymph#: 1.9 10*3/uL (ref 0.9–3.3)

## 2009-11-29 LAB — VITAMIN D 25 HYDROXY (VIT D DEFICIENCY, FRACTURES): Vit D, 25-Hydroxy: 25 ng/mL — ABNORMAL LOW (ref 30–89)

## 2009-11-29 LAB — LACTATE DEHYDROGENASE: LDH: 114 U/L (ref 94–250)

## 2009-11-29 LAB — COMPREHENSIVE METABOLIC PANEL
ALT: 25 U/L (ref 0–35)
BUN: 24 mg/dL — ABNORMAL HIGH (ref 6–23)
Creatinine, Ser: 0.72 mg/dL (ref 0.40–1.20)
Glucose, Bld: 266 mg/dL — ABNORMAL HIGH (ref 70–99)

## 2009-11-29 LAB — CANCER ANTIGEN 27.29: CA 27.29: 6 U/mL (ref 0–39)

## 2009-11-30 ENCOUNTER — Telehealth: Payer: Self-pay | Admitting: Endocrinology

## 2009-12-06 ENCOUNTER — Encounter: Payer: Self-pay | Admitting: Endocrinology

## 2009-12-21 ENCOUNTER — Ambulatory Visit: Payer: Self-pay | Admitting: Endocrinology

## 2010-01-07 ENCOUNTER — Encounter: Payer: Self-pay | Admitting: Internal Medicine

## 2010-01-22 ENCOUNTER — Ambulatory Visit: Payer: Self-pay | Admitting: Endocrinology

## 2010-01-22 LAB — CONVERTED CEMR LAB: Hgb A1c MFr Bld: 8.7 % — ABNORMAL HIGH (ref 4.6–6.5)

## 2010-01-26 ENCOUNTER — Telehealth (INDEPENDENT_AMBULATORY_CARE_PROVIDER_SITE_OTHER): Payer: Self-pay | Admitting: *Deleted

## 2010-02-07 ENCOUNTER — Encounter: Payer: Self-pay | Admitting: Internal Medicine

## 2010-02-19 ENCOUNTER — Encounter: Payer: Self-pay | Admitting: Internal Medicine

## 2010-03-05 ENCOUNTER — Ambulatory Visit: Payer: Self-pay | Admitting: Endocrinology

## 2010-03-22 ENCOUNTER — Telehealth: Payer: Self-pay | Admitting: Endocrinology

## 2010-04-28 ENCOUNTER — Encounter: Payer: Self-pay | Admitting: Endocrinology

## 2010-05-04 ENCOUNTER — Observation Stay (HOSPITAL_COMMUNITY): Admission: EM | Admit: 2010-05-04 | Discharge: 2010-05-06 | Payer: Self-pay | Admitting: Emergency Medicine

## 2010-05-04 ENCOUNTER — Telehealth: Payer: Self-pay | Admitting: Endocrinology

## 2010-05-04 ENCOUNTER — Ambulatory Visit: Payer: Self-pay | Admitting: Cardiovascular Disease

## 2010-05-04 ENCOUNTER — Telehealth: Payer: Self-pay | Admitting: Internal Medicine

## 2010-05-04 ENCOUNTER — Encounter: Payer: Self-pay | Admitting: Internal Medicine

## 2010-05-14 ENCOUNTER — Telehealth (INDEPENDENT_AMBULATORY_CARE_PROVIDER_SITE_OTHER): Payer: Self-pay | Admitting: Radiology

## 2010-05-15 ENCOUNTER — Encounter (HOSPITAL_COMMUNITY)
Admission: RE | Admit: 2010-05-15 | Discharge: 2010-06-01 | Payer: Self-pay | Source: Home / Self Care | Admitting: Cardiovascular Disease

## 2010-05-15 ENCOUNTER — Ambulatory Visit: Payer: Self-pay | Admitting: Internal Medicine

## 2010-05-15 ENCOUNTER — Encounter: Payer: Self-pay | Admitting: Internal Medicine

## 2010-05-15 ENCOUNTER — Ambulatory Visit: Payer: Self-pay

## 2010-05-21 ENCOUNTER — Encounter: Payer: Self-pay | Admitting: Endocrinology

## 2010-05-30 ENCOUNTER — Ambulatory Visit: Payer: Self-pay | Admitting: Internal Medicine

## 2010-05-31 ENCOUNTER — Encounter: Payer: Self-pay | Admitting: Endocrinology

## 2010-05-31 ENCOUNTER — Ambulatory Visit: Payer: Self-pay | Admitting: Endocrinology

## 2010-05-31 LAB — CONVERTED CEMR LAB: Hgb A1c MFr Bld: 8.9 % — ABNORMAL HIGH (ref 4.6–6.5)

## 2010-07-12 ENCOUNTER — Emergency Department (HOSPITAL_COMMUNITY): Admission: EM | Admit: 2010-07-12 | Discharge: 2009-09-11 | Payer: Self-pay | Admitting: Emergency Medicine

## 2010-07-12 ENCOUNTER — Ambulatory Visit: Payer: Self-pay | Admitting: Endocrinology

## 2010-07-16 ENCOUNTER — Telehealth: Payer: Self-pay | Admitting: Endocrinology

## 2010-07-26 ENCOUNTER — Telehealth: Payer: Self-pay | Admitting: Endocrinology

## 2010-08-14 ENCOUNTER — Telehealth: Payer: Self-pay | Admitting: Internal Medicine

## 2010-08-23 ENCOUNTER — Other Ambulatory Visit: Payer: Self-pay | Admitting: Endocrinology

## 2010-08-23 ENCOUNTER — Telehealth: Payer: Self-pay | Admitting: Endocrinology

## 2010-08-23 ENCOUNTER — Ambulatory Visit
Admission: RE | Admit: 2010-08-23 | Discharge: 2010-08-23 | Payer: Self-pay | Source: Home / Self Care | Attending: Endocrinology | Admitting: Endocrinology

## 2010-08-23 LAB — HEMOGLOBIN A1C: Hgb A1c MFr Bld: 8.5 % — ABNORMAL HIGH (ref 4.6–6.5)

## 2010-08-26 ENCOUNTER — Encounter: Payer: Self-pay | Admitting: Oncology

## 2010-09-04 NOTE — Assessment & Plan Note (Signed)
Summary: 3 WK FU  STC   Vital Signs:  Patient profile:   55 year old female Height:      65 inches (165.10 cm) Weight:      237.38 pounds (107.90 kg) O2 Sat:      95 % on Room air Temp:     98.1 degrees F (36.72 degrees C) oral Pulse rate:   117 / minute BP sitting:   110 / 72  (left arm) Cuff size:   large  Vitals Entered By: Josph Macho RMA (Dec 21, 2009 2:14 PM)  O2 Flow:  Room air CC: 3 week follow up/ CF Is Patient Diabetic? Yes   Referring Provider:  c. smith Primary Provider:  Caffie Damme  CC:  3 week follow up/ CF.  History of Present Illness: pt says she is feeling better since a recent gi illness.  she brings a record of her cbg's which i have reviewed today.  it varies from 180-300.  it is lowest in the afternoon, and higher at all other times of day.    Current Medications (verified): 1)  Digoxin 0.125 Mg Tabs (Digoxin) .Marland Kitchen.. 1 Tab Once Daily 2)  Requip 1 Mg Tabs (Ropinirole Hcl) .... Take 1 By Mouth At Bedtime 3)  Altace 5 Mg Cap (Ramipril) .... Take 1 By Mouth Two Times A Day 4)  Lipitor 40 Mg Tabs (Atorvastatin Calcium) .... One By Mouth Daily 5)  Celexa 20 Mg  Tabs (Citalopram Hydrobromide) .... 3 By Mouth Once Daily 6)  Buspirone Hcl 30 Mg  Tabs (Buspirone Hcl) .... Take 1 Tablet By Mouth Two Times A Day 7)  Imodium A-D 2 Mg  Tabs (Loperamide Hcl) .... As Needed 8)  Flexeril 10 Mg  Tabs (Cyclobenzaprine Hcl) .... One By Mouth Two Times A Day 9)  Nitrostat 0.4 Mg  Subl (Nitroglycerin) .... As Needed 10)  Levothyroxine Sodium 75 Mcg  Tabs (Levothyroxine Sodium) .... Take 1 Tablet By Mouth Once A Day 11)  Klor-Con M20 20 Meq  Tbcr (Potassium Chloride Crys Cr) .... 3 Tabs Two Times A Day 12)  Furosemide 20 Mg  Tabs (Furosemide) .Marland Kitchen.. 1 1/2 Two Times A Day, May Take Extra Tab If Needed For Edema 13)  Humalog Kwikpen 100 Unit/ml  Soln (Insulin Lispro (Human)) .... Three Times A Day (Just Before Each Meal) 50-40-140 Units 14)  Levemir Flexpen 100 Unit/ml  Soln  (Insulin Detemir) .Marland Kitchen.. 115 Units At Night 15)  Pen Needles 31g X 8 Mm  Misc (Insulin Pen Needle) .... 9/day 16)  Coreg 25 Mg Tabs (Carvedilol) .... One By Mouth Bid 17)  Proventil Hfa 108 (90 Base) Mcg/act Aers (Albuterol Sulfate) .... 2 Puffs 4 Times Daily 18)  Nasonex 50 Mcg/act Susp (Mometasone Furoate) .... 2 Sprays Once Daily 19)  Trazodone Hcl 50 Mg Tabs (Trazodone Hcl) .... 2-3  Tablets By Mouth Daily 20)  Lovaza 1 Gm Caps (Omega-3-Acid Ethyl Esters) .... 4 Tabs Qd 21)  Protonix 40 Mg Tbec (Pantoprazole Sodium) .... Two Times A Day 22)  Spironolactone 25 Mg Tabs (Spironolactone) .Marland Kitchen.. 1 Tablet Daily 23)  Tylenol Pm .Marland KitchenMarland Kitchen. 1 At Bedtime 24)  Tylenol Arthritis .... 2 Q 6 Hrs  Allergies (verified): 1)  ! Asa 2)  ! Cipro 3)  ! Sulfa 4)  ! Codeine 5)  ! Naprosyn 6)  ! * Tape 7)  ! * Metals  Past History:  Past Medical History: Last updated: 09/18/2009  1. 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 cath 3/09. EF 45% minimal nonobstructive CAD      b. Previous EF 25%      c. ECHO 6/08 EF 55%      d. ECHO 5/10 EF 30-35%      e. MRI 7/10 EF 45%  2. History of breast cancer 2006 with return in 2008, s/p      right mastectomy.   3. Obesity  4. Hypertension.   5. History of tobacco use.   6. Osteoarthritis.   7. DM2   8. Anxiety and depression followed by a psychiatrist.   9. Hypothyroidism.   10.Asthma.   11.Obstructive sleep apnea with CPAP compliance.   12.GERD.   13.Gastroparesis with irritable bowel syndrome.   14.Restless leg syndrome  Physical Exam  General:  morbidly obese.  no distress  Extremities:  no edema   Impression & Recommendations:  Problem # 1:  AODM (ICD-250.00) she needs some adjustment in her therapy  Medications Added to Medication List This Visit: 1)  Humalog Kwikpen 100 Unit/ml Soln (Insulin lispro (human)) .... Three times a day (just before each meal) 60-40-150 units 2)  Levemir Flexpen 100 Unit/ml Soln (Insulin detemir) .Marland Kitchen.. 125  units at night  Other Orders: Est. Patient Level III (11914)  Patient Instructions: 1)  increase levemir to 125 units at night 2)  reduce humalog to (just before each meal) 60-40-150 units 3)  Please schedule a follow-up appointment in 1 month. 4)  check your blood sugar 3 times a day.  vary the time of day when you check, between before the 3 meals, and at bedtime.  also check if you have symptoms of your blood sugar being too high or too low.  please keep a record of the readings and bring it to your next appointment here.  please call us sooner if you are having low blood sugar episodes.   Prescriptions: LEVEMIR FLEXPEN 100 UNIT/ML  SOLN (INSULIN DETEMIR) 125 units at night  #3 boxes x 11   Entered and Authorized by:   Minus Breeding MD   Signed by:   Minus Breeding MD on 12/21/2009   Method used:   Electronically to        [facility] (retail)       [name]       [place], [place]  [address]       Ph: [phone]       Fax: [phone]   RxID:   [id number] HUMALOG KWIKPEN 100 UNIT/ML  SOLN (INSULIN LISPRO (HUMAN)) three times a day (just before each meal) 60-40-150 units  #6 boxes x 11   Entered and Authorized by:   Minus Breeding MD   Signed by:   Minus Breeding MD on 12/21/2009   Method used:   Electronically to        [facility] (retail)       [address]       [place], [place]  [address]       Ph: [phone]       Fax: [phone]   RxID:   [id number]

## 2010-09-04 NOTE — Assessment & Plan Note (Signed)
Summary: 1 mo rov /nws   Vital Signs:  Patient profile:   55 year old female Weight:      237 pounds BMI:     39.58 Temp:     97.2 degrees F Pulse rate:   100 / minute BP sitting:   90 / 58  (left arm) Cuff size:   large  Vitals Entered By: Lamar Sprinkles, CMA (January 22, 2010 11:45 AM) CC: F/u   Referring Provider:  c. smith Primary Provider:  Caffie Damme  CC:  F/u.  History of Present Illness: she brings a record of her cbg's which i have reviewed today.  it varies from 160-280, with no trend throughout the day.  pt states she feels well in general, except for an ingrown left great toenail.    Current Medications (verified): 1)  Digoxin 0.125 Mg Tabs (Digoxin) .Marland Kitchen.. 1 Tab Once Daily 2)  Requip 1 Mg Tabs (Ropinirole Hcl) .... Take 1 By Mouth At Bedtime 3)  Altace 5 Mg Cap (Ramipril) .... Take 1 By Mouth Two Times A Day 4)  Lipitor 40 Mg Tabs (Atorvastatin Calcium) .... One By Mouth Daily 5)  Celexa 20 Mg  Tabs (Citalopram Hydrobromide) .... 3 By Mouth Once Daily 6)  Buspirone Hcl 30 Mg  Tabs (Buspirone Hcl) .... Take 1 Tablet By Mouth Two Times A Day 7)  Imodium A-D 2 Mg  Tabs (Loperamide Hcl) .... As Needed 8)  Flexeril 10 Mg  Tabs (Cyclobenzaprine Hcl) .... One By Mouth Two Times A Day 9)  Nitrostat 0.4 Mg  Subl (Nitroglycerin) .... As Needed 10)  Levothyroxine Sodium 75 Mcg  Tabs (Levothyroxine Sodium) .... Take 1 Tablet By Mouth Once A Day 11)  Klor-Con M20 20 Meq  Tbcr (Potassium Chloride Crys Cr) .... 3 Tabs Two Times A Day 12)  Furosemide 20 Mg  Tabs (Furosemide) .Marland Kitchen.. 1 1/2 Two Times A Day, May Take Extra Tab If Needed For Edema 13)  Humalog Kwikpen 100 Unit/ml  Soln (Insulin Lispro (Human)) .... Three Times A Day (Just Before Each Meal) 60-40-150 Units 14)  Levemir Flexpen 100 Unit/ml  Soln (Insulin Detemir) .Marland Kitchen.. 125 Units At Night 15)  Pen Needles 31g X 8 Mm  Misc (Insulin Pen Needle) .... 9/day 16)  Coreg 25 Mg Tabs (Carvedilol) .... One By Mouth Bid 17)  Proventil  Hfa 108 (90 Base) Mcg/act Aers (Albuterol Sulfate) .... 2 Puffs 4 Times Daily 18)  Nasonex 50 Mcg/act Susp (Mometasone Furoate) .... 2 Sprays Once Daily 19)  Trazodone Hcl 50 Mg Tabs (Trazodone Hcl) .... 2-3  Tablets By Mouth Daily 20)  Lovaza 1 Gm Caps (Omega-3-Acid Ethyl Esters) .... 4 Tabs Qd 21)  Protonix 40 Mg Tbec (Pantoprazole Sodium) .... Two Times A Day 22)  Spironolactone 25 Mg Tabs (Spironolactone) .Marland Kitchen.. 1 Tablet Daily 23)  Tylenol Pm .Marland KitchenMarland Kitchen. 1 At Bedtime 24)  Tylenol Arthritis .... 2 Q 6 Hrs  Allergies (verified): 1)  ! Asa 2)  ! Cipro 3)  ! Sulfa 4)  ! Codeine 5)  ! Naprosyn 6)  ! * Tape 7)  ! * Metals  Past History:  Past Medical History: Last updated: 09/18/2009  1. 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 cath 3/09. EF 45% minimal nonobstructive CAD      b. Previous EF 25%      c. ECHO 6/08 EF 55%      d. ECHO 5/10 EF 30-35%      e.  MRI 7/10 EF 45%  2. History of breast cancer 2006 with return in 2008, s/p      right mastectomy.   3. Obesity  4. Hypertension.   5. History of tobacco use.   6. Osteoarthritis.   7. DM2   8. Anxiety and depression followed by a psychiatrist.   9. Hypothyroidism.   10.Asthma.   11.Obstructive sleep apnea with CPAP compliance.   12.GERD.   13.Gastroparesis with irritable bowel syndrome.   14.Restless leg syndrome  Review of Systems  The patient denies hypoglycemia.    Physical Exam  General:  normal appearance.   Skin:  insulin injection sites at anterior abdomen are normal  Additional Exam:  Hemoglobin A1C       [H]  8.7 %    Impression & Recommendations:  Problem # 1:  AODM (ICD-250.00) needs increased rx  Medications Added to Medication List This Visit: 1)  Humalog Kwikpen 100 Unit/ml Soln (Insulin lispro (human)) .... Three times a day (just before each meal) 70-50-160 units 2)  Levemir Flexpen 100 Unit/ml Soln (Insulin detemir) .Marland KitchenMarland KitchenMarland Kitchen 140 units at night 3)  Pen Needles 31g X 8 Mm Misc (Insulin pen  needle) .Marland Kitchen.. 14/day  Other Orders: TLB-A1C / Hgb A1C (Glycohemoglobin) (83036-A1C) Est. Patient Level III (88416) dm  Patient Instructions: 1)  blood tests are being ordered for you today.  please call [phone] to hear your test results. 2)  pending the test results, please increase levemir to 140 units at night, and increase humalog to (just before each meal) 70-50-160 units. 3)  Please schedule a follow-up appointment in 6 weeks. 4)  check your blood sugar 3 times a day.  vary the time of day when you check, between before the 3 meals, and at bedtime.  also check if you have symptoms of your blood sugar being too high or too low.  please keep a record of the readings and bring it to your next appointment here.  please call us sooner if you are having low blood sugar episodes.  5)  (update: i left message on phone-tree:  rx as we discussed) Prescriptions: PEN NEEDLES 31G X 8 MM  MISC (INSULIN PEN NEEDLE) 14/day  #420 x 11   Entered and Authorized by:   Minus Breeding MD   Signed by:   Minus Breeding MD on 01/22/2010   Method used:   Electronically to        [facility] (retail)       [address]       [place], [place]  [address]       Ph: [phone]       Fax: [phone]   RxID:   [id number]

## 2010-09-04 NOTE — Progress Notes (Signed)
Summary: Nuxc Pre-Procedure  Phone Note Outgoing Call Call back at Home Phone [phone]   Call placed by: Cleon Gustin Call placed to: Patient Reason for Call: Confirm/change Appt Summary of Call: Reviewed information on My[employer] Information Sheet (see scanned document for further details).  Spoke with patient.     Nuclear Med Background Indications for Stress Test: Evaluation for Ischemia, Post Hospital  Indications Comments: D/C'd from [facility] 05/06/10- Chest pain;negative enzymes  History: Asthma, COPD, Echo, Heart Catheterization, Myocardial Perfusion Study  History Comments: 2008- MPS- No ischemia. EF= 46% 2009- Cath- N/O CAD. EF= 45% 2010- Echo- EF= 30-35% Nonischemic cardiomyopathy  Symptoms: Chest Pain, DOE, Fatigue, Nausea, SOB  Symptoms Comments: Chest pain with left arm radiation   Nuclear Pre-Procedure Cardiac Risk Factors: Family History - CAD, History of Smoking, Hypertension, IDDM Type 2, Lipids, Obesity Height (in): 65

## 2010-09-04 NOTE — Cardiovascular Report (Signed)
Summary: Heart Failure Program Summary Report  Heart Failure Program Summary Report   Imported By: Roderic Ovens 11/02/2009 12:38:37  _____________________________________________________________________  External Attachment:    Type:   Image     Comment:   External Document

## 2010-09-04 NOTE — Assessment & Plan Note (Signed)
Summary: rov   Referring Provider:  c. smith Primary Provider:  Caffie Damme  CC:  SOB.  History of Present Illness: [name] is 55 y/o women with history of recurrent breast cancer, obesity, HTN, diabetes and OSA. 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 Her EF was initially in the 25% range but came up to abut 45-55%. On most recent echo EF was 30-35%.  Referred to EP for consideration of ICD. Cardiac MRI in July. EF 45% so not candidate.  Last weekend seen in ER for volume overload. Weight was up about 10 pounds. BNP only 118. Got IV lasix and got 2L out. During this past week started to re-accumulate and had to take extra lasix 2x. Feels better. Continues to be SOB with mild exertion. Edema resolved. No orthopnea or PND. Has chronic CP but none since last week.    Current Medications (verified): 1)  Digoxin 0.125 Mg Tabs (Digoxin) .Marland Kitchen.. 1 Tab Once Daily 2)  Requip 1 Mg Tabs (Ropinirole Hcl) .... Take 1 By Mouth At Bedtime 3)  Altace 5 Mg Cap (Ramipril) .... Take 1 By Mouth Two Times A Day 4)  Lipitor 40 Mg Tabs (Atorvastatin Calcium) .... One By Mouth Daily 5)  Celexa 20 Mg  Tabs (Citalopram Hydrobromide) .... 3 By Mouth Once Daily 6)  Buspirone Hcl 30 Mg  Tabs (Buspirone Hcl) .... Take 1 Tablet By Mouth Two Times A Day 7)  Imodium A-D 2 Mg  Tabs (Loperamide Hcl) .... As Needed 8)  Flexeril 10 Mg  Tabs (Cyclobenzaprine Hcl) .... One By Mouth Two Times A Day 9)  Nitrostat 0.4 Mg  Subl (Nitroglycerin) .... As Needed 10)  Levothyroxine Sodium 75 Mcg  Tabs (Levothyroxine Sodium) .... Take 1 Tablet By Mouth Once A Day 11)  Klor-Con M20 20 Meq  Tbcr (Potassium Chloride Crys Cr) .... Two Tablets By Mouth Three Times A Day 12)  Furosemide 20 Mg  Tabs (Furosemide) .Marland Kitchen.. 1 1/2 Two Times A Day, May Take Extra Tab If Needed For Edema 13)  Humalog Kwikpen 100 Unit/ml  Soln (Insulin Lispro (Human)) .... Tid (Qac) 50-10-130 Units 14)   Levemir Flexpen 100 Unit/ml  Soln (Insulin Detemir) .... 80 Units At Night 15)  Pen Needles 31g X 8 Mm  Misc (Insulin Pen Needle) .... 9/day 16)  Coreg 25 Mg Tabs (Carvedilol) .... Take 1 1/2 By Mouth Two Times A Day 17)  Proventil Hfa 108 (90 Base) Mcg/act Aers (Albuterol Sulfate) .... 2 Puffs 4 Times Daily 18)  Nasonex 50 Mcg/act Susp (Mometasone Furoate) .... 2 Sprays Once Daily 19)  Trazodone Hcl 50 Mg Tabs (Trazodone Hcl) .... 2 Tablets By Mouth Daily 20)  Lovaza 1 Gm Caps (Omega-3-Acid Ethyl Esters) .... 4 Tabs Qd 21)  Protonix 40 Mg Tbec (Pantoprazole Sodium) .... Two Times A Day  Allergies: 1)  ! Asa 2)  ! Cipro 3)  ! Sulfa 4)  ! Codeine 5)  ! Naprosyn 6)  ! * Tape 7)  ! * Metals  Past History:  Past Medical History:  1. 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 cath 3/09. EF 45% minimal nonobstructive CAD      b. Previous EF 25%      c. ECHO 6/08 EF 55%      d. ECHO 5/10 EF 30-35%      e. MRI 7/10 EF 45%  2. History of breast cancer 2006  with return in 2008, s/p      right mastectomy.   3. Obesity  4. Hypertension.   5. History of tobacco use.   6. Osteoarthritis.   7. DM2   8. Anxiety and depression followed by a psychiatrist.   9. Hypothyroidism.   10.Asthma.   11.Obstructive sleep apnea with CPAP compliance.   12.GERD.   13.Gastroparesis with irritable bowel syndrome.   14.Restless leg syndrome  Review of Systems       As per HPI and past medical history; otherwise all systems negative.   Vital Signs:  Patient profile:   55 year old female Height:      65 inches Weight:      229 pounds BMI:     38.25 Pulse rate:   101 / minute BP sitting:   100 / 58  (right arm) Cuff size:   regular  Vitals Entered By: Hardin Negus, RMA (September 18, 2009 8:47 AM)  Physical Exam  General:  Gen: well appearing. no resp difficulty HEENT: normal Neck: supple. no obvious JVD. Carotids 2+ bilat; no bruits. No lymphadenopathy or thryomegaly  appreciated. Cor: PMI nondisplaced. Regular rate & rhythm. No rubs, gallops, murmur. Lungs: clear Abdomen: obese. soft, nontender, nondistended. Good bowel sounds. Extremities: no cyanosis, clubbing, rash, edema Neuro: alert & orientedx3, cranial nerves grossly intact. moves all 4 extremities w/o difficulty. affect pleasant     Impression & Recommendations:  Problem # 1:  SYSTOLIC HEART FAILURE, CHRONIC (ICD-428.22) Had recent exacerbation now close to being back to baseline although she did have to take extra lasix 2x this week. Remains NYHA class II-III. Start spironolactone 12.5 once daily. Recheck BMET next week. Will see her back in 3 weeks for further eval.  Problem # 2:  TACHYCARDIA (ICD-785) This has been fairly persistent. has been 1 year since thyroid panel checked here. Will recheck.   Other Orders: EKG w/ Interpretation (93000)  Patient Instructions: 1)  Your physician recommends that you schedule a follow-up appointment in: 3 weeks with Dr Gala Romney 2)  Your physician recommends that you return for lab work in:  today (BNP and BMP) 428.22  TSH and T4 785.0 and in 3 weeks  BMP 428.22 3)  Your physician has recommended you make the following change in your medication:  4)  Start Spironlatone 12.5 mg once a day Prescriptions: SPIRONOLACTONE 25 MG TABS (SPIRONOLACTONE) one by mouth daily  #30 x 11   Entered by:   Charolotte Capuchin, RN   Authorized by:   Dolores Patty, MD, [facility]   Signed by:   Charolotte Capuchin, RN on 09/18/2009   Method used:   Electronically to        [facility] (retail)       [address]       [place], [place]  [address]       Ph: [phone]       Fax: [phone]   RxID:   [id number]

## 2010-09-04 NOTE — Progress Notes (Signed)
Summary: flexeril  Phone Note Refill Request Message from:  Fax from Pharmacy on May 04, 2010 9:10 AM  Refills Requested: Medication #1:  FLEXERIL 10 MG  TABS one by mouth two times a day   Dosage confirmed as above?Dosage Confirmed   Last Refilled: 04/06/2010  Method Requested: Electronic Initial call taken by: Brenton Grills MA,  May 04, 2010 9:10 AM    Prescriptions: FLEXERIL 10 MG  TABS (CYCLOBENZAPRINE HCL) one by mouth two times a day  #60 x 3   Entered by:   Brenton Grills MA   Authorized by:   Minus Breeding MD   Signed by:   Brenton Grills MA on 05/04/2010   Method used:   Electronically to        [facility] (retail)       [address]       [place], [place]  [address]       Ph: [phone]       Fax: [phone]   RxID:   [id number]

## 2010-09-04 NOTE — Assessment & Plan Note (Signed)
Summary: Cardiology Nuclear Testing  Nuclear Med Background Indications for Stress Test: Evaluation for Ischemia, Post Hospital  Indications Comments: 05/06/10 Chest pain;negative enzymes  History: Asthma, COPD, Echo, Heart Catheterization, Myocardial Perfusion Study  History Comments: '08 YNW:[id number], EF=46%; '09 Cath:n/o CAD  Symptoms: Chest Pain, Dizziness, DOE, Fatigue, Light-Headedness, Nausea, Palpitations, Rapid HR, SOB, Vomiting  Symptoms Comments: Chest pain>(L)arm. Last episode of HY:QMVH night   Nuclear Pre-Procedure Cardiac Risk Factors: Family History - CAD, History of Smoking, Hypertension, IDDM Type 2, Lipids, Obesity Caffeine/Decaff Intake: NONE NPO After: 8:00 PM Lungs: Clear.  O2 Sat 93% on  RA. IV 0.9% NS with Angio Cath: 22g     IV Site: L Hand IV Started by: Doyne Keel, CNMT Chest Size (in) 44     Cup Size D     Height (in): 65 Weight (lb): 237 BMI: 39.58 Tech Comments: HELD COREG THIS AM  Nuclear Med Study 1 or 2 day study:  1 day     Stress Test Type:  Eugenie Birks Reading MD:  Arvilla Meres, MD     Referring MD:  Verne Carrow, MD Resting Radionuclide:  Technetium 24m Tetrofosmin     Resting Radionuclide Dose:  11.0 mCi  Stress Radionuclide:  Technetium 18m Tetrofosmin     Stress Radionuclide Dose:  32.0 mCi   Stress Protocol   Lexiscan: 0.4 mg   Stress Test Technologist:  Cathlyn Parsons, RN     Nuclear Technologist:  Doyne Keel, CNMT  Rest Procedure  Myocardial perfusion imaging was performed at rest 45 minutes following the intravenous administration of Technetium 20m Tetrofosmin.  Stress Procedure  The patient received IV Lexiscan 0.4 mg over 15-seconds.  Technetium 31m Tetrofosmin injected at 30-seconds.  There were no significant ST-T wave changes with infusion.  She did have a hypotensive response and did c/o chest pressure with infusion.  Quantitative spect images were obtained after a 45 minute delay.  QPS Raw Data Images:   Normal; no motion artifact; normal heart/lung ratio. Stress Images:  Minimally decreased uptake in the distal anterior wall Rest Images:  Normal homogeneous uptake in all areas of the myocardium. Subtraction (SDS):  Minimal reversible defect in the distal anterior wall. Likely due to shifting breast attenuation. Transient Ischemic Dilatation:  .95  (Normal <1.22)  Lung/Heart Ratio:  .34  (Normal <0.45)  Quantitative Gated Spect Images QGS EDV:  10443 ml QGS ESV:  59 ml QGS cine images:  Normal  Findings Normal nuclear study      Overall Impression  Exercise Capacity: Lexiscan with no exercise. ECG Impression: No significant ST segment change suggestive of ischemia. Overall Impression: Normal stress nuclear study. Overall Impression Comments: Minimal reversible defect in the distal anterior wall. Likely due to shifting breast attenuation.

## 2010-09-04 NOTE — Assessment & Plan Note (Signed)
Summary: F/U on DM   Vital Signs:  Patient profile:   55 year old female Height:      65 inches (165.10 cm) Weight:      235.50 pounds (107.05 kg) BMI:     39.33 O2 Sat:      94 % on Room air Temp:     98.0 degrees F (36.67 degrees C) oral Pulse rate:   109 / minute BP sitting:   128 / 72  (left arm) Cuff size:   large  Vitals Entered By: Brenton Grills MA (May 31, 2010 8:15 AM)  O2 Flow:  Room air CC: Follow-up visit/aj Is Patient Diabetic? Yes   Referring Provider:  c. smith Primary Provider:  Caffie Damme  CC:  Follow-up visit/aj.  History of Present Illness: no cbg record, but states cbg's are still over 200 at hs, and in am.  it is lower at other times of day.  pt states she feels well in general.   Current Medications (verified): 1)  Digoxin 0.125 Mg Tabs (Digoxin) .Marland Kitchen.. 1 Tab Once Daily 2)  Requip 1 Mg Tabs (Ropinirole Hcl) .... Take 1 By Mouth At Bedtime 3)  Altace 5 Mg Cap (Ramipril) .... Take 1 By Mouth Two Times A Day 4)  Lipitor 40 Mg Tabs (Atorvastatin Calcium) .... One By Mouth Daily 5)  Celexa 20 Mg  Tabs (Citalopram Hydrobromide) .... 2 By Mouth Once Daily 6)  Buspirone Hcl 30 Mg  Tabs (Buspirone Hcl) .... Take 1 Tablet By Mouth Two Times A Day 7)  Imodium A-D 2 Mg  Tabs (Loperamide Hcl) .... As Needed 8)  Flexeril 10 Mg  Tabs (Cyclobenzaprine Hcl) .... One By Mouth Two Times A Day 9)  Nitrostat 0.4 Mg  Subl (Nitroglycerin) .... As Needed 10)  Levothyroxine Sodium 75 Mcg  Tabs (Levothyroxine Sodium) .... Take 1 Tablet By Mouth Once A Day 11)  Klor-Con M20 20 Meq  Tbcr (Potassium Chloride Crys Cr) .... 3 Tabs Two Times A Day 12)  Furosemide 20 Mg  Tabs (Furosemide) .... 2 Tabs Two Times A Day, May Take Extra Tab If Needed For Edema 13)  Humalog Kwikpen 100 Unit/ml  Soln (Insulin Lispro (Human)) .... Three Times A Day (Just Before Each Meal) 70-40-200 Units 14)  Levemir Flexpen 100 Unit/ml  Soln (Insulin Detemir) .Marland KitchenMarland KitchenMarland Kitchen 140 Units At Night 15)  Pen Needles  31g X 8 Mm  Misc (Insulin Pen Needle) .Marland Kitchen.. 14/day 16)  Coreg 25 Mg Tabs (Carvedilol) .... One By Mouth Bid 17)  Proventil Hfa 108 (90 Base) Mcg/act Aers (Albuterol Sulfate) .... 2 Puffs 4 Times Daily 18)  Nasonex 50 Mcg/act Susp (Mometasone Furoate) .... 2 Sprays Once Daily 19)  Trazodone Hcl 50 Mg Tabs (Trazodone Hcl) .... 2-3  Tablets By Mouth Daily 20)  Lovaza 1 Gm Caps (Omega-3-Acid Ethyl Esters) .... 4 Tabs Qd 21)  Protonix 40 Mg Tbec (Pantoprazole Sodium) .... Two Times A Day 22)  Spironolactone 25 Mg Tabs (Spironolactone) .Marland Kitchen.. 1 Tablet Daily 23)  Tylenol Pm .Marland KitchenMarland Kitchen. 1 At Bedtime 24)  Tylenol Arthritis .... 2 Q 6 Hrs  Allergies (verified): 1)  ! Asa 2)  ! Cipro 3)  ! Sulfa 4)  ! Codeine 5)  ! Naprosyn 6)  ! * Tape 7)  ! * Metals  Past History:  Past Medical History: Last updated: 05/30/2010  1. 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 cath 3/09. EF 45% minimal nonobstructive CAD  b. Previous EF 25%      c. ECHO 6/08 EF 55%      d. ECHO 5/10 EF 30-35%      e. MRI 7/10 EF 45%      f. Myoview 10/11: 59% no ischemia  2. History of breast cancer 2006 with return in 2008, s/p      right mastectomy.   3. Obesity  4. Hypertension.   5. History of tobacco use.   6. Osteoarthritis.   7. DM2   8. Anxiety and depression followed by a psychiatrist.   9. Hypothyroidism.   10.Asthma.   11.Obstructive sleep apnea with CPAP compliance.   12.GERD.   13.Gastroparesis with irritable bowel syndrome.   14.Restless leg syndrome  Review of Systems  The patient denies hypoglycemia.    Physical Exam  General:  morbidly obese.  no distress  Pulses:  dorsalis pedis intact bilat.   Extremities:  no deformity.  no ulcer on the feet.  feet are of normal color and temp.   trace right pedal edema, trace left pedal edema, and mycotic toenails.   Neurologic:  sensation is intact to touch on the feet  Additional Exam:  Hemoglobin A1C       [H]  8.9 %    Impression &  Recommendations:  Problem # 1:  AODM (ICD-250.00)  Medications Added to Medication List This Visit: 1)  Humalog Kwikpen 100 Unit/ml Soln (Insulin lispro (human)) .... Three times a day (just before each meal) 70-40-225 units  Other Orders: TLB-A1C / Hgb A1C (Glycohemoglobin) (83036-A1C) Est. Patient Level III (29562)  Patient Instructions: 1)  blood tests are being ordered for you today.  please call [phone] to hear your test results. 2)  pending the test results, please continue levemir 140 units at night, and increase humalog to (just before each meal) 70-40-225 units. 3)  Please schedule a follow-up appointment in 6 weeks. 4)  check your blood sugar 3 times a day.  vary the time of day when you check, between before the 3 meals, and at bedtime.  also check if you have symptoms of your blood sugar being too high or too low.  please keep a record of the readings and bring it to your next appointment here.  please call us sooner if you are having low blood sugar episodes.    5)  (update: i left message on phone-tree:  rx as we discussed.  call sooner if cbg's are still high, and tell us what time of day it is high). Prescriptions: HUMALOG KWIKPEN 100 UNIT/ML  SOLN (INSULIN LISPRO (HUMAN)) three times a day (just before each meal) 70-40-225 units  #8 boxes x 11   Entered and Authorized by:   Minus Breeding MD   Signed by:   Minus Breeding MD on 05/31/2010   Method used:   Electronically to        [facility]* (retail)       [address]       [place], [place]  [address]       Ph: [phone]       Fax: [phone]   RxID:   [id number]    Orders Added: 1)  TLB-A1C / Hgb A1C (Glycohemoglobin) [83036-A1C] 2)  Est. Patient Level III [56433]

## 2010-09-04 NOTE — Progress Notes (Signed)
  Phone Note Refill Request Message from:  Fax from Pharmacy on November 27, 2009 10:16 AM  Refills Requested: Medication #1:  FLEXERIL 10 MG  TABS one by mouth two times a day   Dosage confirmed as above?Dosage Confirmed Initial call taken by: Josph Macho RMA,  November 27, 2009 10:16 AM    Prescriptions: FLEXERIL 10 MG  TABS (CYCLOBENZAPRINE HCL) one by mouth two times a day  #60 x 4   Entered by:   Josph Macho RMA   Authorized by:   Minus Breeding MD   Signed by:   Josph Macho RMA on 11/27/2009   Method used:   Electronically to        [facility] (retail)       [address]       [place], [place]  [address]       Ph: [phone]       Fax: [phone]   RxID:   [id number]

## 2010-09-04 NOTE — Letter (Signed)
Summary: [facility]   Imported By: Sherian Rein 05/31/2010 12:01:47  _____________________________________________________________________  External Attachment:    Type:   Image     Comment:   External Document

## 2010-09-04 NOTE — Assessment & Plan Note (Signed)
Summary: 6 WK FU  STC   Vital Signs:  Patient profile:   55 year old female Height:      65 inches (165.10 cm) Weight:      236.13 pounds (107.33 kg) BMI:     39.44 O2 Sat:      95 % on Room air Temp:     97.2 degrees F (36.22 degrees C) oral Pulse rate:   100 / minute Pulse rhythm:   regular BP sitting:   98 / 62  (left arm) Cuff size:   large  Vitals Entered By: Brenton Grills MA (March 05, 2010 2:00 PM)  O2 Flow:  Room air CC: 6 wk f/u/discuss pain management/aj Is Patient Diabetic? Yes Comments pt is due for pap/pt had colonscopy in 2009 (polyps)   Referring Provider:  c. Katrinka Blazing Primary Provider:  Caffie Damme  CC:  6 wk f/u/discuss pain management/aj.  History of Present Illness: no cbg record, but states cbg's are "high due to pain."  it varies from 120-400.  it is lowest in the afternoon, and highest at hs.  main symptom is low-back pain.    Current Medications (verified): 1)  Digoxin 0.125 Mg Tabs (Digoxin) .Marland Kitchen.. 1 Tab Once Daily 2)  Requip 1 Mg Tabs (Ropinirole Hcl) .... Take 1 By Mouth At Bedtime 3)  Altace 5 Mg Cap (Ramipril) .... Take 1 By Mouth Two Times A Day 4)  Lipitor 40 Mg Tabs (Atorvastatin Calcium) .... One By Mouth Daily 5)  Celexa 20 Mg  Tabs (Citalopram Hydrobromide) .... 3 By Mouth Once Daily 6)  Buspirone Hcl 30 Mg  Tabs (Buspirone Hcl) .... Take 1 Tablet By Mouth Two Times A Day 7)  Imodium A-D 2 Mg  Tabs (Loperamide Hcl) .... As Needed 8)  Flexeril 10 Mg  Tabs (Cyclobenzaprine Hcl) .... One By Mouth Two Times A Day 9)  Nitrostat 0.4 Mg  Subl (Nitroglycerin) .... As Needed 10)  Levothyroxine Sodium 75 Mcg  Tabs (Levothyroxine Sodium) .... Take 1 Tablet By Mouth Once A Day 11)  Klor-Con M20 20 Meq  Tbcr (Potassium Chloride Crys Cr) .... 3 Tabs Two Times A Day 12)  Furosemide 20 Mg  Tabs (Furosemide) .Marland Kitchen.. 1 1/2 Two Times A Day, May Take Extra Tab If Needed For Edema 13)  Humalog Kwikpen 100 Unit/ml  Soln (Insulin Lispro (Human)) .... Three Times A  Day (Just Before Each Meal) 70-50-160 Units 14)  Levemir Flexpen 100 Unit/ml  Soln (Insulin Detemir) .Marland KitchenMarland KitchenMarland Kitchen 140 Units At Night 15)  Pen Needles 31g X 8 Mm  Misc (Insulin Pen Needle) .Marland Kitchen.. 14/day 16)  Coreg 25 Mg Tabs (Carvedilol) .... One By Mouth Bid 17)  Proventil Hfa 108 (90 Base) Mcg/act Aers (Albuterol Sulfate) .... 2 Puffs 4 Times Daily 18)  Nasonex 50 Mcg/act Susp (Mometasone Furoate) .... 2 Sprays Once Daily 19)  Trazodone Hcl 50 Mg Tabs (Trazodone Hcl) .... 2-3  Tablets By Mouth Daily 20)  Lovaza 1 Gm Caps (Omega-3-Acid Ethyl Esters) .... 4 Tabs Qd 21)  Protonix 40 Mg Tbec (Pantoprazole Sodium) .... Two Times A Day 22)  Spironolactone 25 Mg Tabs (Spironolactone) .Marland Kitchen.. 1 Tablet Daily 23)  Tylenol Pm .Marland KitchenMarland Kitchen. 1 At Bedtime 24)  Tylenol Arthritis .... 2 Q 6 Hrs  Allergies (verified): 1)  ! Asa 2)  ! Cipro 3)  ! Sulfa 4)  ! Codeine 5)  ! Naprosyn 6)  ! * Tape 7)  ! * Metals  Past History:  Past Medical History: Last updated:  09/18/2009  1. 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 cath 3/09. EF 45% minimal nonobstructive CAD      b. Previous EF 25%      c. ECHO 6/08 EF 55%      d. ECHO 5/10 EF 30-35%      e. MRI 7/10 EF 45%  2. History of breast cancer 2006 with return in 2008, s/p      right mastectomy.   3. Obesity  4. Hypertension.   5. History of tobacco use.   6. Osteoarthritis.   7. DM2   8. Anxiety and depression followed by a psychiatrist.   9. Hypothyroidism.   10.Asthma.   11.Obstructive sleep apnea with CPAP compliance.   12.GERD.   13.Gastroparesis with irritable bowel syndrome.   14.Restless leg syndrome  Review of Systems  The patient denies hypoglycemia.    Physical Exam  General:  morbidly obese.  no distress  Neck:  no thyroid enlargement    Impression & Recommendations:  Problem # 1:  AODM (ICD-250.00) needs increased rx  Medications Added to Medication List This Visit: 1)  Humalog Kwikpen 100 Unit/ml Soln (Insulin lispro  (human)) .... Three times a day (just before each meal) 70-40-200 units  Other Orders: Est. Patient Level III (40981)  Patient Instructions: 1)  pending the test results, please continue levemir 140 units at night, and increase humalog to (just before each meal) 70-40-200 units. 2)  Please schedule a follow-up appointment in 6 weeks. 3)  check your blood sugar 3 times a day.  vary the time of day when you check, between before the 3 meals, and at bedtime.  also check if you have symptoms of your blood sugar being too high or too low.  please keep a record of the readings and bring it to your next appointment here.  please call us sooner if you are having low blood sugar episodes.      Preventive Care Screening  Mammogram:    Date:  06/05/2009    Results:  normal

## 2010-09-04 NOTE — Progress Notes (Signed)
Summary: Rx refill req  Phone Note Refill Request Message from:  Patient on March 22, 2010 9:32 AM  Refills Requested: Medication #1:  LEVEMIR FLEXPEN 100 UNIT/ML  SOLN 140 units at night   Dosage confirmed as above?Dosage Confirmed   Supply Requested: 6 months  Medication #2:  HUMALOG KWIKPEN 100 UNIT/ML  SOLN three times a day (just before each meal) 70-40-200 units   Dosage confirmed as above?Dosage Confirmed   Supply Requested: 6 months  Method Requested: Electronic Initial call taken by: Margaret Pyle, CMA,  March 22, 2010 9:36 AM    Prescriptions: LEVEMIR FLEXPEN 100 UNIT/ML  SOLN (INSULIN DETEMIR) 140 units at night  #67mth x 5   Entered by:   Margaret Pyle, CMA   Authorized by:   Minus Breeding MD   Signed by:   Margaret Pyle, CMA on 03/22/2010   Method used:   Electronically to        [facility] (retail)       [address]       [place], [place]  [address]       Ph: [phone]       Fax: [phone]   RxID:   [id number] HUMALOG KWIKPEN 100 UNIT/ML  SOLN (INSULIN LISPRO (HUMAN)) three times a day (just before each meal) 70-40-200 units  #69mth x 5   Entered by:   Margaret Pyle, CMA   Authorized by:   Minus Breeding MD   Signed by:   Margaret Pyle, CMA on 03/22/2010   Method used:   Electronically to        [facility] (retail)       [address]       [place], [place]  [address]       Ph: [phone]       Fax: [phone]   RxID:   [id number]

## 2010-09-04 NOTE — Progress Notes (Signed)
  Phone Note Refill Request Message from:  Fax from Pharmacy on November 30, 2009 12:45 PM  Refills Requested: Medication #1:  PEN NEEDLES 31G X 8 MM  MISC 9/day   Dosage confirmed as above?Dosage Confirmed Initial call taken by: Josph Macho RMA,  November 30, 2009 12:46 PM    Prescriptions: PEN NEEDLES 31G X 8 MM  MISC (INSULIN PEN NEEDLE) 9/day  #270 x 5   Entered by:   Josph Macho RMA   Authorized by:   Minus Breeding MD   Signed by:   Josph Macho RMA on 11/30/2009   Method used:   Electronically to        [facility] (retail)       [address]       [place], [place]  [address]       Ph: [phone]       Fax: [phone]   RxID:   [id number]

## 2010-09-04 NOTE — Progress Notes (Signed)
Summary: HAVING LEGS CRAMPS  Phone Note Call from Patient Call back at [phone]   Caller: Patient Summary of Call: PT HAVING BAD LEG CRAMPS. Initial call taken by: Judie Grieve,  October 19, 2009 1:41 PM  Follow-up for Phone Call        pt states she often wakes up w/leg cramps, she is past due for labwork, sch pt for bmet and bnp tom, will call pt w/results Meredith Staggers, RN  October 19, 2009 2:00 PM

## 2010-09-04 NOTE — Progress Notes (Signed)
Summary: refill-lost last one  Phone Note Refill Request   Refills Requested: Medication #1:  ALTACE 5 MG CAP Take 1 by mouth two times a day qd   Supply Requested: 1 month [facility], lost last refill, has no meds   Method Requested: Fax to Local Pharmacy Initial call taken by: Migdalia Dk,  September 01, 2009 8:06 AM  Follow-up for Phone Call        left message that her message was taken care of Follow-up by: Hardin Negus, RMA,  September 01, 2009 10:15 AM    Prescriptions: ALTACE 5 MG CAP (RAMIPRIL) Take 1 by mouth two times a day qd  #60 x 5   Entered by:   Hardin Negus, RMA   Authorized by:   Dolores Patty, MD, [facility]   Signed by:   Hardin Negus, RMA on 09/01/2009   Method used:   Electronically to        [facility]* (retail)       [address]       [place], [place]  [address]       Ph: [phone]       Fax: [phone]   RxID:   [id number]

## 2010-09-04 NOTE — Progress Notes (Signed)
  Phone Note Refill Request Message from:  Fax from Pharmacy on August 08, 2009 8:42 AM  Refills Requested: Medication #1:  LEVEMIR FLEXPEN 100 UNIT/ML  SOLN 80 units at night   Dosage confirmed as above?Dosage Confirmed Initial call taken by: Josph Macho CMA,  August 08, 2009 8:43 AM    Prescriptions: LEVEMIR FLEXPEN 100 UNIT/ML  SOLN (INSULIN DETEMIR) 80 units at night  #1 month x 5   Entered by:   Josph Macho CMA   Authorized by:   Minus Breeding MD   Signed by:   Josph Macho CMA on 08/08/2009   Method used:   Electronically to        [facility] (retail)       [address]       [place], [place]  [address]       Ph: [phone]       Fax: [phone]   RxID:   [id number]

## 2010-09-04 NOTE — Letter (Signed)
[facility]   Imported By: Lennie Odor 01/09/2010 14:30:57  _____________________________________________________________________  External Attachment:    Type:   Image     Comment:   External Document

## 2010-09-04 NOTE — Progress Notes (Signed)
Summary: carvedilol refill  Phone Note Outgoing Call   Summary of Call: called pt about a request I rcvd for her carvedilol, question on dose the fax says 1 1/2 tabs two times a day and our system says 1 tab two times a day. Need to know what the patient is actually taking. Hardin Negus, RMA  November 14, 2009 2:02 PM   Follow-up for Phone Call        pt calling back, pt is taking carvedilol 25mg  1 tab two times a day, pt is out of meds Follow-up by: Migdalia Dk,  November 15, 2009 9:55 AM    Prescriptions: COREG 25 MG TABS (CARVEDILOL) one by mouth bid  #60 x 6   Entered by:   Hardin Negus, RMA   Authorized by:   Dolores Patty, MD, [facility]   Signed by:   Hardin Negus, RMA on 11/15/2009   Method used:   Electronically to        [facility]* (retail)       [address]       [place], [place]  [address]       Ph: [phone]       Fax: [phone]   RxID:   [id number]

## 2010-09-04 NOTE — Cardiovascular Report (Signed)
Summary: Heart Failure Program Summary Report   Heart Failure Program Summary Report   Imported By: Roderic Ovens 03/26/2010 10:04:39  _____________________________________________________________________  External Attachment:    Type:   Image     Comment:   External Document

## 2010-09-04 NOTE — Cardiovascular Report (Signed)
Summary: Heart Failure Program Patient Disenrollment Notice   Heart Failure Program Patient Disenrollment Notice   Imported By: Roderic Ovens 03/02/2010 10:58:51  _____________________________________________________________________  External Attachment:    Type:   Image     Comment:   External Document

## 2010-09-04 NOTE — Medication Information (Signed)
[facility]: [facility]   Imported By: Lester  05/01/2010 13:32:50  _____________________________________________________________________  External Attachment:    Type:   Image     Comment:   External Document

## 2010-09-04 NOTE — Assessment & Plan Note (Signed)
Summary: 6 WK FU  STC   Vital Signs:  Patient profile:   55 year old female Height:      65 inches (165.10 cm) Weight:      236 pounds (107.27 kg) BMI:     39.41 O2 Sat:      95 % on Room air Temp:     97.6 degrees F (36.44 degrees C) oral Pulse rate:   106 / minute Pulse rhythm:   regular BP sitting:   112 / 72  (left arm) Cuff size:   large  Vitals Entered By: Brenton Grills CMA Duncan Dull) (July 12, 2010 7:59 AM)  O2 Flow:  Room air CC: 6 week F/U/aj Is Patient Diabetic? Yes   Referring Provider:  c. smith Primary Provider:  Caffie Damme  CC:  6 week F/U/aj.  History of Present Illness: she brings a record of her cbg's which i have reviewed today.  it is still over 200 at hs, and in am.  it is lower at other times of day, but never lower than 160.  pt states she feels well in general.   Current Medications (verified): 1)  Digoxin 0.125 Mg Tabs (Digoxin) .Marland Kitchen.. 1 Tab Once Daily 2)  Requip 1 Mg Tabs (Ropinirole Hcl) .... Take 1 By Mouth At Bedtime 3)  Altace 5 Mg Cap (Ramipril) .... Take 1 By Mouth Two Times A Day 4)  Lipitor 40 Mg Tabs (Atorvastatin Calcium) .... One By Mouth Daily 5)  Celexa 20 Mg  Tabs (Citalopram Hydrobromide) .... 2 By Mouth Once Daily 6)  Buspirone Hcl 30 Mg  Tabs (Buspirone Hcl) .... Take 1 Tablet By Mouth Two Times A Day 7)  Imodium A-D 2 Mg  Tabs (Loperamide Hcl) .... As Needed 8)  Flexeril 10 Mg  Tabs (Cyclobenzaprine Hcl) .... One By Mouth Two Times A Day 9)  Nitrostat 0.4 Mg  Subl (Nitroglycerin) .... As Needed 10)  Levothyroxine Sodium 75 Mcg  Tabs (Levothyroxine Sodium) .... Take 1 Tablet By Mouth Once A Day 11)  Klor-Con M20 20 Meq  Tbcr (Potassium Chloride Crys Cr) .... 3 Tabs Two Times A Day 12)  Furosemide 20 Mg  Tabs (Furosemide) .... 2 Tabs Two Times A Day, May Take Extra Tab If Needed For Edema 13)  Humalog Kwikpen 100 Unit/ml  Soln (Insulin Lispro (Human)) .... Three Times A Day (Just Before Each Meal) 70-40-225 Units 14)  Levemir  Flexpen 100 Unit/ml  Soln (Insulin Detemir) .Marland KitchenMarland KitchenMarland Kitchen 140 Units At Night 15)  Pen Needles 31g X 8 Mm  Misc (Insulin Pen Needle) .Marland Kitchen.. 14/day 16)  Coreg 25 Mg Tabs (Carvedilol) .... One By Mouth Bid 17)  Proventil Hfa 108 (90 Base) Mcg/act Aers (Albuterol Sulfate) .... 2 Puffs 4 Times Daily 18)  Nasonex 50 Mcg/act Susp (Mometasone Furoate) .... 2 Sprays Once Daily 19)  Trazodone Hcl 50 Mg Tabs (Trazodone Hcl) .... 2-3  Tablets By Mouth Daily 20)  Lovaza 1 Gm Caps (Omega-3-Acid Ethyl Esters) .... 4 Tabs Qd 21)  Protonix 40 Mg Tbec (Pantoprazole Sodium) .... Two Times A Day 22)  Spironolactone 25 Mg Tabs (Spironolactone) .Marland Kitchen.. 1 Tablet Daily 23)  Tylenol Pm .Marland KitchenMarland Kitchen. 1 At Bedtime 24)  Tylenol Arthritis .... 2 Q 6 Hrs 25)  Glucosamine 500 Mg Caps (Glucosamine Sulfate) .Marland Kitchen.. 1 Capsule By Mouth Once Daily  Allergies (verified): 1)  ! Asa 2)  ! Cipro 3)  ! Sulfa 4)  ! Codeine 5)  ! Naprosyn 6)  ! * Tape 7)  ! *  Metals  Past History:  Past Medical History: Last updated: 05/30/2010  1. 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 cath 3/09. EF 45% minimal nonobstructive CAD      b. Previous EF 25%      c. ECHO 6/08 EF 55%      d. ECHO 5/10 EF 30-35%      e. MRI 7/10 EF 45%      f. Myoview 10/11: 59% no ischemia  2. History of breast cancer 2006 with return in 2008, s/p      right mastectomy.   3. Obesity  4. Hypertension.   5. History of tobacco use.   6. Osteoarthritis.   7. DM2   8. Anxiety and depression followed by a psychiatrist.   9. Hypothyroidism.   10.Asthma.   11.Obstructive sleep apnea with CPAP compliance.   12.GERD.   13.Gastroparesis with irritable bowel syndrome.   14.Restless leg syndrome  Review of Systems  The patient denies hypoglycemia.    Physical Exam  General:  morbidly obese.  no distress  Psych:  Alert and cooperative; normal mood and affect; normal attention span and concentration.     Impression & Recommendations:  Problem # 1:  AODM  (ICD-250.00) cbg pattern indicates she needs more humalog at evening meal  Medications Added to Medication List This Visit: 1)  Humalog Kwikpen 100 Unit/ml Soln (Insulin lispro (human)) .... Three times a day (just before each meal) 70-40-265 units 2)  Glucosamine 500 Mg Caps (Glucosamine sulfate) .Marland Kitchen.. 1 capsule by mouth once daily 3)  Bd Pen Needle Short U/f 31g X 8 Mm Misc (Insulin pen needle) .Marland Kitchen.. 18/day  Other Orders: Est. Patient Level III (45409)  Patient Instructions: 1)  blood tests are being ordered for you today.  please call [phone] to hear your test results. 2)  pending the test results, please continue levemir 140 units at night, and increase humalog to (just before each meal) 70-40-265 units. 3)  Please schedule a follow-up appointment in 6 weeks. 4)  check your blood sugar 3 times a day.  vary the time of day when you check, between before the 3 meals, and at bedtime.  also check if you have symptoms of your blood sugar being too high or too low.  please keep a record of the readings and bring it to your next appointment here.  please call us sooner if you are having low blood sugar episodes.    Prescriptions: BD PEN NEEDLE SHORT U/F 31G X 8 MM MISC (INSULIN PEN NEEDLE) 18/day  #540 x 11   Entered and Authorized by:   Minus Breeding MD   Signed by:   Minus Breeding MD on 07/12/2010   Method used:   Electronically to        [facility]* (retail)       [address]       [place], [place]  [address]       Ph: [phone]       Fax: [phone]   RxID:   [id number] HUMALOG KWIKPEN 100 UNIT/ML  SOLN (INSULIN LISPRO (HUMAN)) three times a day (just before each meal) 70-40-265 units  #9 boxes x 11   Entered and Authorized by:   Minus Breeding MD   Signed by:   Minus Breeding MD on 07/12/2010   Method used:   Electronically to  [facility]* (retail)       [address]       [place], [place]  [address]        Ph: [phone]       Fax: [phone]   RxID:   [id number]    Orders Added: 1)  Est. Patient Level III [00938]   Immunization History:  Influenza Immunization History:    Influenza:  historical (04/05/2010)   Immunization History:  Influenza Immunization History:    Influenza:  Historical (04/05/2010)   Preventive Care Screening  Last Flu Shot:    Date:  04/05/2010    Results:  Historical   Colonoscopy:    Date:  08/05/1996    Results:  normal     Preventive Care Screening  Last Flu Shot:    Date:  04/05/2010    Results:  Historical   Colonoscopy:    Date:  08/05/1996    Results:  normal

## 2010-09-04 NOTE — Progress Notes (Signed)
Summary: Humalog Kwikpen  Phone Note Refill Request Message from:  Fax from Pharmacy on September 01, 2009 1:30 PM  Refills Requested: Medication #1:  HUMALOG KWIKPEN 100 UNIT/ML  SOLN tid (qac) 50-10-130 units Written on fax as three times a day ---40-20-100. Please advise.  Initial call taken by: Lucious Groves,  September 01, 2009 1:30 PM  Follow-up for Phone Call        i resent with most current dosage. Follow-up by: Minus Breeding MD,  September 01, 2009 1:54 PM  Additional Follow-up for Phone Call Additional follow up Details #1::        thanks. Additional Follow-up by: Lucious Groves,  September 01, 2009 2:15 PM    Prescriptions: HUMALOG KWIKPEN 100 UNIT/ML  SOLN (INSULIN LISPRO (HUMAN)) tid (qac) 50-10-130 units  #4 boxes x 11   Entered and Authorized by:   Minus Breeding MD   Signed by:   Minus Breeding MD on 09/01/2009   Method used:   Electronically to        [facility] (retail)       [address]       [place], [place]  [address]       Ph: [phone]       Fax: [phone]   RxID:   [id number]

## 2010-09-04 NOTE — Progress Notes (Signed)
Summary: took two nitro's feels better but fatigued  Phone Note Call from Patient   Caller: Patient  [phone] Reason for Call: Talk to Nurse Summary of Call: pt took two nitro's, one at 9a and one at 12p feels better but feels very fatigued -pls advise Initial call taken by: Glynda Jaeger,  May 04, 2010 1:41 PM  Follow-up for Phone Call        Pt. reports she  had chest and left arm pain at 9 AM today relieved with one NTG. Nauseated also at that time.  Pain happened again at noon. Relieved also with one NTG. Instructed pt she should go to ED at [facility] to be evaluated. She will have someone take her. No pain at present time but feeling very tired and has SOB on exertion.  Will notify ED. Follow-up by: Dossie Arbour, RN, BSN,  May 04, 2010 2:00 PM

## 2010-09-04 NOTE — Progress Notes (Signed)
Summary: REQUIP RX  Phone Note Refill Request Message from:  Pharmacy on [facility] fax [phone]  Refills Requested: Medication #1:  REQUIP 1 MG TABS take 1 by mouth at bedtime last seen 2009, #15 x o given 08/18/09 with note to schedule office visit --NO APPT SCHEDULED NEED OFFICE VISIT  Initial call taken by: Kandice Hams CMA,  January 26, 2010 3:21 PM

## 2010-09-04 NOTE — Assessment & Plan Note (Signed)
Summary: 3wk f/u need bmp sameday   Visit Type:  Follow-up Referring Provider:  c. Katrinka Blazing Primary Provider:  Caffie Damme  CC:  SOB.  History of Present Illness: [name] is 55 y/o women with history of recurrent breast cancer, obesity, HTN, diabetes and OSA. 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 Her EF was initially in the 25% range but came up to abut 45-55%. On most recent echo EF was 30-35%.  Referred to EP for consideration of ICD. Cardiac MRI in July 2010. EF 45% so not candidate.  We saw her last in February 2011. At that time had mild fluid overload. Took some lasix and started spiro 12.5. Feels she is doing much better from a fluid perspective. Less swelling, no orthopnea or PND. Occasional DOE. No CP. Recent K+ 4.1. Gaining weigh which she attributes to nighttime eating.   Current Medications (verified): 1)  Digoxin 0.125 Mg Tabs (Digoxin) .Marland Kitchen.. 1 Tab Once Daily 2)  Requip 1 Mg Tabs (Ropinirole Hcl) .... Take 1 By Mouth At Bedtime 3)  Altace 5 Mg Cap (Ramipril) .... Take 1 By Mouth Two Times A Day 4)  Lipitor 40 Mg Tabs (Atorvastatin Calcium) .... One By Mouth Daily 5)  Celexa 20 Mg  Tabs (Citalopram Hydrobromide) .... 3 By Mouth Once Daily 6)  Buspirone Hcl 30 Mg  Tabs (Buspirone Hcl) .... Take 1 Tablet By Mouth Two Times A Day 7)  Imodium A-D 2 Mg  Tabs (Loperamide Hcl) .... As Needed 8)  Flexeril 10 Mg  Tabs (Cyclobenzaprine Hcl) .... One By Mouth Two Times A Day 9)  Nitrostat 0.4 Mg  Subl (Nitroglycerin) .... As Needed 10)  Levothyroxine Sodium 75 Mcg  Tabs (Levothyroxine Sodium) .... Take 1 Tablet By Mouth Once A Day 11)  Klor-Con M20 20 Meq  Tbcr (Potassium Chloride Crys Cr) .... 3 Tabs Two Times A Day 12)  Furosemide 20 Mg  Tabs (Furosemide) .Marland Kitchen.. 1 1/2 Two Times A Day, May Take Extra Tab If Needed For Edema 13)  Humalog Kwikpen 100 Unit/ml  Soln (Insulin Lispro (Human)) .... Tid As Directed 14)  Levemir  Flexpen 100 Unit/ml  Soln (Insulin Detemir) .Marland Kitchen.. 100 Units At Night 15)  Pen Needles 31g X 8 Mm  Misc (Insulin Pen Needle) .... 9/day 16)  Coreg 25 Mg Tabs (Carvedilol) .... One By Mouth Bid 17)  Proventil Hfa 108 (90 Base) Mcg/act Aers (Albuterol Sulfate) .... 2 Puffs 4 Times Daily 18)  Nasonex 50 Mcg/act Susp (Mometasone Furoate) .... 2 Sprays Once Daily 19)  Trazodone Hcl 50 Mg Tabs (Trazodone Hcl) .... 2-3  Tablets By Mouth Daily 20)  Lovaza 1 Gm Caps (Omega-3-Acid Ethyl Esters) .... 4 Tabs Qd 21)  Protonix 40 Mg Tbec (Pantoprazole Sodium) .... Two Times A Day 22)  Spironolactone 25 Mg Tabs (Spironolactone) .... 1/2 Tablet Daily  Allergies (verified): 1)  ! Asa 2)  ! Cipro 3)  ! Sulfa 4)  ! Codeine 5)  ! Naprosyn 6)  ! * Tape 7)  ! * Metals  Past History:  Past Medical History: Last updated: 09/18/2009  1. 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 cath 3/09. EF 45% minimal nonobstructive CAD      b. Previous EF 25%      c. ECHO 6/08 EF 55%      d. ECHO 5/10 EF 30-35%      e. MRI 7/10 EF 45%  2. History of breast cancer 2006 with return in 2008, s/p      right mastectomy.   3. Obesity  4. Hypertension.   5. History of tobacco use.   6. Osteoarthritis.   7. DM2   8. Anxiety and depression followed by a psychiatrist.   9. Hypothyroidism.   10.Asthma.   11.Obstructive sleep apnea with CPAP compliance.   12.GERD.   13.Gastroparesis with irritable bowel syndrome.   14.Restless leg syndrome  Review of Systems       As per HPI and past medical history; otherwise all systems negative.   Vital Signs:  Patient profile:   55 year old female Height:      65 inches Weight:      235 pounds BMI:     39.25 Pulse rate:   85 / minute BP sitting:   106 / 58  (left arm) Cuff size:   large  Vitals Entered By: Hardin Negus, RMA (November 07, 2009 12:39 PM)  O2 Sat at Rest %:  94 O2 Sat on room air ambulating %:  92 CC: SOB   Physical Exam  General:  Gen:  well appearing. no resp difficulty HEENT: normal Neck: supple. JVP appears 6-7. Carotids 2+ bilat; no bruits. No lymphadenopathy or thryomegaly appreciated. Cor: PMI nondisplaced. Regular rate & rhythm. No rubs, gallops, murmur. Lungs: clear Abdomen: obese. soft, nontender, nondistended. Good bowel sounds. Extremities: no cyanosis, clubbing, rash, tr edema Neuro: alert & orientedx3, cranial nerves grossly intact. moves all 4 extremities w/o difficulty. affect pleasant      Impression & Recommendations:  Problem # 1:  SYSTOLIC HEART FAILURE, CHRONIC (ICD-428.22) Doing fairly well. Still with mild volume overload. Will increase spiro to 25 once daily. Recheck K in 2 weeks. If becomes dizzy can cut back to 12.5 once daily.   Problem # 2:  OBESITY (ICD-278.00) Suggested she do he best to limit nighttime snacking.   Patient Instructions: 1)  Increase Spironolactone to 25mg  daily 2)  Labs in 2 weeks--BMET 428.22 3)  Follow up in 4 months Prescriptions: SPIRONOLACTONE 25 MG TABS (SPIRONOLACTONE) 1 tablet daily  #30 x 6   Entered by:   Meredith Staggers, RN   Authorized by:   Dolores Patty, MD, [facility]   Signed by:   Meredith Staggers, RN on 11/07/2009   Method used:   Electronically to        [facility]* (retail)       [address]       [place], [place]  [address]       Ph: [phone]       Fax: [phone]   RxID:   [id number]

## 2010-09-04 NOTE — Letter (Signed)
Summary: ER Notification  [facility], Main Office  [address]   [place], [place] [address]   Phone: [phone]  Fax: [phone]    May 04, 2010 2:04 PM  [facility]  The above referenced patient has been advised to report directly to the Emergency Room. Please see below for more information:  Dx:  chest pain    Private Vehicle  _________X______ or EMS:  ________________   Orders:  Yes ______ or No  ___x____   Notify upon arrival:     Trish [phone]) [phone]       Or _________________   Thank you,    [facility]

## 2010-09-04 NOTE — Assessment & Plan Note (Signed)
Summary: 3 MTH FU---STC   Vital Signs:  Patient profile:   55 year old female Height:      65 inches (165.10 cm) Weight:      232.13 pounds (105.51 kg) O2 Sat:      88 % on Room air Temp:     97.8 degrees F (36.56 degrees C) oral Pulse rate:   112 / minute BP sitting:   110 / 72  (left arm) Cuff size:   large  Vitals Entered By: Josph Macho RMA (October 23, 2009 10:07 AM)  O2 Flow:  Room air CC: 3 month follow up/ CF Is Patient Diabetic? Yes   Referring Provider:  c. smith Primary Provider:  Caffie Damme  CC:  3 month follow up/ CF.  History of Present Illness: no cbg record, but states cbg's are elevated (200 in the afternoon, and in am).  pt states she feels well in general.  Current Medications (verified): 1)  Digoxin 0.125 Mg Tabs (Digoxin) .Marland Kitchen.. 1 Tab Once Daily 2)  Requip 1 Mg Tabs (Ropinirole Hcl) .... Take 1 By Mouth At Bedtime 3)  Altace 5 Mg Cap (Ramipril) .... Take 1 By Mouth Two Times A Day 4)  Lipitor 40 Mg Tabs (Atorvastatin Calcium) .... One By Mouth Daily 5)  Celexa 20 Mg  Tabs (Citalopram Hydrobromide) .... 3 By Mouth Once Daily 6)  Buspirone Hcl 30 Mg  Tabs (Buspirone Hcl) .... Take 1 Tablet By Mouth Two Times A Day 7)  Imodium A-D 2 Mg  Tabs (Loperamide Hcl) .... As Needed 8)  Flexeril 10 Mg  Tabs (Cyclobenzaprine Hcl) .... One By Mouth Two Times A Day 9)  Nitrostat 0.4 Mg  Subl (Nitroglycerin) .... As Needed 10)  Levothyroxine Sodium 75 Mcg  Tabs (Levothyroxine Sodium) .... Take 1 Tablet By Mouth Once A Day 11)  Klor-Con M20 20 Meq  Tbcr (Potassium Chloride Crys Cr) .... Two Tablets By Mouth Three Times A Day 12)  Furosemide 20 Mg  Tabs (Furosemide) .Marland Kitchen.. 1 1/2 Two Times A Day, May Take Extra Tab If Needed For Edema 13)  Humalog Kwikpen 100 Unit/ml  Soln (Insulin Lispro (Human)) .... Tid (Qac) 50-10-130 Units 14)  Levemir Flexpen 100 Unit/ml  Soln (Insulin Detemir) .... 80 Units At Night 15)  Pen Needles 31g X 8 Mm  Misc (Insulin Pen Needle) ....  9/day 16)  Coreg 25 Mg Tabs (Carvedilol) .... One By Mouth Bid 17)  Proventil Hfa 108 (90 Base) Mcg/act Aers (Albuterol Sulfate) .... 2 Puffs 4 Times Daily 18)  Nasonex 50 Mcg/act Susp (Mometasone Furoate) .... 2 Sprays Once Daily 19)  Trazodone Hcl 50 Mg Tabs (Trazodone Hcl) .... 2 Tablets By Mouth Daily 20)  Lovaza 1 Gm Caps (Omega-3-Acid Ethyl Esters) .... 4 Tabs Qd 21)  Protonix 40 Mg Tbec (Pantoprazole Sodium) .... Two Times A Day 22)  Spironolactone 25 Mg Tabs (Spironolactone) .... 1/2 Tablet Daily  Allergies (verified): 1)  ! Asa 2)  ! Cipro 3)  ! Sulfa 4)  ! Codeine 5)  ! Naprosyn 6)  ! * Tape 7)  ! * Metals  Past History:  Past Medical History: Last updated: 09/18/2009  1. 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 cath 3/09. EF 45% minimal nonobstructive CAD      b. Previous EF 25%      c. ECHO 6/08 EF 55%      d. ECHO 5/10 EF 30-35%      e.  MRI 7/10 EF 45%  2. History of breast cancer 2006 with return in 2008, s/p      right mastectomy.   3. Obesity  4. Hypertension.   5. History of tobacco use.   6. Osteoarthritis.   7. DM2   8. Anxiety and depression followed by a psychiatrist.   9. Hypothyroidism.   10.Asthma.   11.Obstructive sleep apnea with CPAP compliance.   12.GERD.   13.Gastroparesis with irritable bowel syndrome.   14.Restless leg syndrome  Review of Systems  The patient denies hypoglycemia.    Physical Exam  General:  morbidly obese.  no distress  Neck:  Supple without thyroid enlargement or tenderness.  Additional Exam:   Hemoglobin A1C       [H]  8.9 %     Impression & Recommendations:  Problem # 1:  AODM (ICD-250.00) needs increased rx  Medications Added to Medication List This Visit: 1)  Humalog Kwikpen 100 Unit/ml Soln (Insulin lispro (human)) .... Tid (qac) 50-30-130 units 2)  Levemir Flexpen 100 Unit/ml Soln (Insulin detemir) .Marland Kitchen.. 100 units at night  Other Orders: TLB-A1C / Hgb A1C (Glycohemoglobin)  (83036-A1C)  Patient Instructions: 1)  increase levemir to 100 units at night 2)  reduce humalog to (just before each meal) 50-30-130 units 3)  Please schedule a follow-up appointment in 1 month. 4)  check your blood sugar 3 times a day.  vary the time of day when you check, between before the 3 meals, and at bedtime.  also check if you have symptoms of your blood sugar being too high or too low.  please keep a record of the readings and bring it to your next appointment here.  please call us sooner if you are having low blood sugar episodes.

## 2010-09-04 NOTE — Cardiovascular Report (Signed)
Summary: Heart Failure Program Summary Report  Heart Failure Program Summary Report   Imported By: Roderic Ovens 11/02/2009 12:39:40  _____________________________________________________________________  External Attachment:    Type:   Image     Comment:   External Document

## 2010-09-04 NOTE — Assessment & Plan Note (Signed)
Summary: 4 month rov/sl   Visit Type:  Follow-up Referring Provider:  c. Katrinka Blazing Primary Provider:  Caffie Damme  CC:  SOB (a little bit).  History of Present Illness: [name] is 55 y/o women with history of recurrent breast cancer, obesity, HTN, diabetes and OSA. 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 Her EF was initially in the 25% range but came up to abut 45-55%. On most recent echo EF was 30-35%.  Referred to EP for consideration of ICD. Cardiac MRI in July 2010 EF 45% so not candidate  Admitted 10/11to [facility] with atypical CP. Ruled out for MI with serial enzymes. Outpatient Myoview without any ischemia. EF 59%   While iin hospital magnesium noted to be 1.4. HgBa1c 9.6 TGs > 600  Here for f/u. Just got back from beach.  Feeling much better. No signifcant CP. Dyspnea with good and bad days - probably Class III. Occasional edeam and uses lasix sliding scal.  Blood sugars averaging 200-250 with values up to 500. Going to see Dr. Everardo All tomorrow. Says she just started back exercising at [employer]. Doing weights and exercise bike about 10 mins/day.   Current Medications (verified): 1)  Digoxin 0.125 Mg Tabs (Digoxin) .Marland Kitchen.. 1 Tab Once Daily 2)  Requip 1 Mg Tabs (Ropinirole Hcl) .... Take 1 By Mouth At Bedtime 3)  Altace 5 Mg Cap (Ramipril) .... Take 1 By Mouth Two Times A Day 4)  Lipitor 40 Mg Tabs (Atorvastatin Calcium) .... One By Mouth Daily 5)  Celexa 20 Mg  Tabs (Citalopram Hydrobromide) .... 2 By Mouth Once Daily 6)  Buspirone Hcl 30 Mg  Tabs (Buspirone Hcl) .... Take 1 Tablet By Mouth Two Times A Day 7)  Imodium A-D 2 Mg  Tabs (Loperamide Hcl) .... As Needed 8)  Flexeril 10 Mg  Tabs (Cyclobenzaprine Hcl) .... One By Mouth Two Times A Day 9)  Nitrostat 0.4 Mg  Subl (Nitroglycerin) .... As Needed 10)  Levothyroxine Sodium 75 Mcg  Tabs (Levothyroxine Sodium) .... Take 1 Tablet By Mouth Once A Day 11)  Klor-Con M20 20 Meq   Tbcr (Potassium Chloride Crys Cr) .... 3 Tabs Two Times A Day 12)  Furosemide 20 Mg  Tabs (Furosemide) .... 2 Tabs Two Times A Day, May Take Extra Tab If Needed For Edema 13)  Humalog Kwikpen 100 Unit/ml  Soln (Insulin Lispro (Human)) .... Three Times A Day (Just Before Each Meal) 70-40-200 Units 14)  Levemir Flexpen 100 Unit/ml  Soln (Insulin Detemir) .Marland KitchenMarland KitchenMarland Kitchen 140 Units At Night 15)  Pen Needles 31g X 8 Mm  Misc (Insulin Pen Needle) .Marland Kitchen.. 14/day 16)  Coreg 25 Mg Tabs (Carvedilol) .... One By Mouth Bid 17)  Proventil Hfa 108 (90 Base) Mcg/act Aers (Albuterol Sulfate) .... 2 Puffs 4 Times Daily 18)  Nasonex 50 Mcg/act Susp (Mometasone Furoate) .... 2 Sprays Once Daily 19)  Trazodone Hcl 50 Mg Tabs (Trazodone Hcl) .... 2-3  Tablets By Mouth Daily 20)  Lovaza 1 Gm Caps (Omega-3-Acid Ethyl Esters) .... 4 Tabs Qd 21)  Protonix 40 Mg Tbec (Pantoprazole Sodium) .... Two Times A Day 22)  Spironolactone 25 Mg Tabs (Spironolactone) .Marland Kitchen.. 1 Tablet Daily 23)  Tylenol Pm .Marland KitchenMarland Kitchen. 1 At Bedtime 24)  Tylenol Arthritis .... 2 Q 6 Hrs  Allergies (verified): 1)  ! Asa 2)  ! Cipro 3)  ! Sulfa 4)  ! Codeine 5)  ! Naprosyn 6)  ! * Tape 7)  ! *  Metals  Past History:  Past Medical History:  1. 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 cath 3/09. EF 45% minimal nonobstructive CAD      b. Previous EF 25%      c. ECHO 6/08 EF 55%      d. ECHO 5/10 EF 30-35%      e. MRI 7/10 EF 45%      f. Myoview 10/11: 59% no ischemia  2. History of breast cancer 2006 with return in 2008, s/p      right mastectomy.   3. Obesity  4. Hypertension.   5. History of tobacco use.   6. Osteoarthritis.   7. DM2   8. Anxiety and depression followed by a psychiatrist.   9. Hypothyroidism.   10.Asthma.   11.Obstructive sleep apnea with CPAP compliance.   12.GERD.   13.Gastroparesis with irritable bowel syndrome.   14.Restless leg syndrome  Review of Systems       As per HPI and past medical history; otherwise all  systems negative.   Vital Signs:  Patient profile:   55 year old female Height:      65 inches Weight:      238 pounds BMI:     39.75 Pulse rate:   95 / minute BP sitting:   106 / 68  (left arm) Cuff size:   large  Vitals Entered By: Hardin Negus, RMA (May 30, 2010 10:26 AM)   Impression & Recommendations:  Problem # 1:  CARDIOMYOPATHY, SECONDARY CHEMOTHERAPY (ICD-425.9) EF normalized. Volume status looks good. on good meds. suspect majority of limitation is due to her obesity. Spent a long time counseling her on need for weight loss and routine exercise with goal of 40 mins aerobic exercise 4-5 days per week and loss of 1/2 per week. She will f/u with Dr. Everardo All tomorrow.  Problem # 2:  Chest pain Reviewed her recent stress test with her. Myoview normal. No further work-up at this time.  Problem # 3:  OBESITY (ICD-278.00) As above. Focus on exercise and weight loss.  Patient Instructions: 1)  Your physician wants you to follow-up in:  6 months.  You will receive a reminder letter in the mail two months in advance. If you don't receive a letter, please call our office to schedule the follow-up appointment.

## 2010-09-04 NOTE — Assessment & Plan Note (Signed)
Summary: 1 MTH FU  STC   Vital Signs:  Patient profile:   55 year old female Height:      65 inches (165.10 cm) Weight:      232.38 pounds (105.63 kg) O2 Sat:      97 % on Room air Temp:     97.1 degrees F (36.17 degrees C) oral Pulse rate:   111 / minute BP sitting:   118 / 72  (left arm) Cuff size:   large  Vitals Entered By: Josph Macho RMA (November 27, 2009 11:15 AM)  O2 Flow:  Room air CC: 1 month follow up/ CF   Referring Provider:  c. Katrinka Blazing Primary Provider:  Caffie Damme  CC:  1 month follow up/ CF.  History of Present Illness: pt states she feels well in general, except for chronic low-back pain.  she feels the pain is increasing her cbg's.  she brings a record of her cbg's which i have reviewed today.  it is 200-300, at all times of day, except for mid-100's before lunch.    Current Medications (verified): 1)  Digoxin 0.125 Mg Tabs (Digoxin) .Marland Kitchen.. 1 Tab Once Daily 2)  Requip 1 Mg Tabs (Ropinirole Hcl) .... Take 1 By Mouth At Bedtime 3)  Altace 5 Mg Cap (Ramipril) .... Take 1 By Mouth Two Times A Day 4)  Lipitor 40 Mg Tabs (Atorvastatin Calcium) .... One By Mouth Daily 5)  Celexa 20 Mg  Tabs (Citalopram Hydrobromide) .... 3 By Mouth Once Daily 6)  Buspirone Hcl 30 Mg  Tabs (Buspirone Hcl) .... Take 1 Tablet By Mouth Two Times A Day 7)  Imodium A-D 2 Mg  Tabs (Loperamide Hcl) .... As Needed 8)  Flexeril 10 Mg  Tabs (Cyclobenzaprine Hcl) .... One By Mouth Two Times A Day 9)  Nitrostat 0.4 Mg  Subl (Nitroglycerin) .... As Needed 10)  Levothyroxine Sodium 75 Mcg  Tabs (Levothyroxine Sodium) .... Take 1 Tablet By Mouth Once A Day 11)  Klor-Con M20 20 Meq  Tbcr (Potassium Chloride Crys Cr) .... 3 Tabs Two Times A Day 12)  Furosemide 20 Mg  Tabs (Furosemide) .Marland Kitchen.. 1 1/2 Two Times A Day, May Take Extra Tab If Needed For Edema 13)  Humalog Kwikpen 100 Unit/ml  Soln (Insulin Lispro (Human)) .... Tid As Directed 14)  Levemir Flexpen 100 Unit/ml  Soln (Insulin Detemir) .Marland Kitchen.. 100  Units At Night 15)  Pen Needles 31g X 8 Mm  Misc (Insulin Pen Needle) .... 9/day 16)  Coreg 25 Mg Tabs (Carvedilol) .... One By Mouth Bid 17)  Proventil Hfa 108 (90 Base) Mcg/act Aers (Albuterol Sulfate) .... 2 Puffs 4 Times Daily 18)  Nasonex 50 Mcg/act Susp (Mometasone Furoate) .... 2 Sprays Once Daily 19)  Trazodone Hcl 50 Mg Tabs (Trazodone Hcl) .... 2-3  Tablets By Mouth Daily 20)  Lovaza 1 Gm Caps (Omega-3-Acid Ethyl Esters) .... 4 Tabs Qd 21)  Protonix 40 Mg Tbec (Pantoprazole Sodium) .... Two Times A Day 22)  Spironolactone 25 Mg Tabs (Spironolactone) .Marland Kitchen.. 1 Tablet Daily 23)  Tylenol Pm .Marland KitchenMarland Kitchen. 1 At Bedtime 24)  Tylenol Arthritis .... 2 Q 6 Hrs  Allergies (verified): 1)  ! Asa 2)  ! Cipro 3)  ! Sulfa 4)  ! Codeine 5)  ! Naprosyn 6)  ! * Tape 7)  ! * Metals  Past History:  Past Medical History: Last updated: 09/18/2009  1. 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 cath 3/09.  EF 45% minimal nonobstructive CAD      b. Previous EF 25%      c. ECHO 6/08 EF 55%      d. ECHO 5/10 EF 30-35%      e. MRI 7/10 EF 45%  2. History of breast cancer 2006 with return in 2008, s/p      right mastectomy.   3. Obesity  4. Hypertension.   5. History of tobacco use.   6. Osteoarthritis.   7. DM2   8. Anxiety and depression followed by a psychiatrist.   9. Hypothyroidism.   10.Asthma.   11.Obstructive sleep apnea with CPAP compliance.   12.GERD.   13.Gastroparesis with irritable bowel syndrome.   14.Restless leg syndrome  Review of Systems  The patient denies hypoglycemia.    Physical Exam  General:  normal appearance.   Pulses:  dorsalis pedis intact bilat.   Extremities:  trace right pedal edema and trace left pedal edema.  no deformity.  no ulcer on the feet.  feet are of normal color and temp.   mycotic toenails.   Neurologic:  sensation is intact to touch on the feet.     Impression & Recommendations:  Problem # 1:  AODM (ICD-250.00) needs increased  rx  Medications Added to Medication List This Visit: 1)  Humalog Kwikpen 100 Unit/ml Soln (Insulin lispro (human)) .... Three times a day (just before each meal) 50-40-140 units 2)  Levemir Flexpen 100 Unit/ml Soln (Insulin detemir) .Marland Kitchen.. 115 units at night 3)  Tylenol Pm  .Marland Kitchen.. 1 at bedtime 4)  Tylenol Arthritis  .... 2 q 6 hrs  Other Orders: Est. Patient Level III (48185)  Patient Instructions: 1)  increase levemir to 115 units at night 2)  reduce humalog to (just before each meal) 50-40-140 units 3)  Please schedule a follow-up appointment in 3 weeks. 4)  check your blood sugar 3 times a day.  vary the time of day when you check, between before the 3 meals, and at bedtime.  also check if you have symptoms of your blood sugar being too high or too low.  please keep a record of the readings and bring it to your next appointment here.  please call us sooner if you are having low blood sugar episodes. Prescriptions: LEVEMIR FLEXPEN 100 UNIT/ML  SOLN (INSULIN DETEMIR) 115 units at night  #3 boxes x 11   Entered and Authorized by:   Minus Breeding MD   Signed by:   Minus Breeding MD on 11/27/2009   Method used:   Electronically to        [facility] (retail)       [address]       [place], [place]  [address]       Ph: [phone]       Fax: [phone]   RxID:   [id number]

## 2010-09-04 NOTE — Cardiovascular Report (Signed)
Summary: Heart Failure Program Summary Report   Heart Failure Program Summary Report   Imported By: Roderic Ovens 03/01/2010 09:04:20  _____________________________________________________________________  External Attachment:    Type:   Image     Comment:   External Document

## 2010-09-04 NOTE — Progress Notes (Signed)
Summary: pt had 2lb wt gain over night/nurse from [facility] called   Phone Note Call from Patient Call back at [facility] Phone [phone]   Caller: Patient Reason for Call: Talk to Nurse, Talk to Doctor, Insurance Question Summary of Call: pt was just discharged from ED yesterday morning and over the night pt has gained 2lbs and they had taken off 2liters at hospital dhe needs to know what to do if not at home please leave message pt dose not answer cell phone when driving. pt is at home right now. Initial call taken by: Omer Jack,  September 12, 2009 12:16 PM  Follow-up for Phone Call        pts labs from 2/7 were normal, bnp was only 118, called pt and left mess that labs and fluid level were ok to call back tom w/update on weight, if symptomatic call back today and ask for mess nurse Meredith Staggers, RN  September 12, 2009 2:28 PM   Hinton Lovely RN with [facility] and she spoke with the patient this morning and she has some weight gain and b/p 148/119 blood sugar is 207 nurse will be faxing over information you can reach the nurse at [phone]  Additional Follow-up for Phone Call Additional follow up Details #1::        spoke w/pt she feels bad, she states she has been very carefully watching her salt and fluid but is cont. to gain wt over past week, this AM before meds BP was 148/119, after meds it came down to 138/80, usually her BP is ok, sch appt w/[facility] for [facility] 2/14 Meredith Staggers, RN  September 14, 2009 5:13 PM

## 2010-09-06 NOTE — Progress Notes (Signed)
Summary: refill request  Phone Note Refill Request Message from:  Patient on August 14, 2010 12:00 PM  Refills Requested: Medication #1:  FUROSEMIDE 20 MG  TABS 2 tabs two times a day [facility]   Method Requested: Telephone to Pharmacy Initial call taken by: Glynda Jaeger,  August 14, 2010 12:00 PM  Follow-up for Phone Call        done as they called Follow-up by: Hardin Negus, RMA,  August 14, 2010 12:01 PM

## 2010-09-06 NOTE — Progress Notes (Signed)
Summary: Verify insulin dosage  Phone Note From Pharmacy   Caller: [facility] Request: Speak with Provider Summary of Call: Recieved call from pharmacy just recieved e-script rx on humalog. Want to verify dosage 80-40-240. Per Dr. Everardo All amount is correct. Initial call taken by: Orlan Leavens RMA,  August 23, 2010 9:32 AM

## 2010-09-06 NOTE — Progress Notes (Signed)
Summary: Labs?  Phone Note Call from Patient Call back at Home Phone [phone]   Caller: Mom Summary of Call: Pt called stating that Number 1 on her Patient Instruction sheets states that MD wanted her to have labs but she was not given any orders and I do not see any specific rest in OV. Please advise, does pt needs labs? Initial call taken by: Margaret Pyle, CMA,  July 16, 2010 10:43 AM  Follow-up for Phone Call        sorry, she does not need labs now Follow-up by: Minus Breeding MD,  July 16, 2010 12:37 PM  Additional Follow-up for Phone Call Additional follow up Details #1::        Pt advised Additional Follow-up by: Margaret Pyle, CMA,  July 16, 2010 1:20 PM

## 2010-09-06 NOTE — Progress Notes (Signed)
Summary: elevated CBG/SAE pt  Phone Note Call from Patient   Caller: Patient [phone] Summary of Call: Pt called stating she is still experiencing high CBGs in the am - 240. Pt was advised by SAE to call for med adjustment if continued, please advise. Initial call taken by: Margaret Pyle, CMA,  July 26, 2010 1:53 PM  Follow-up for Phone Call        please verify that the pt PM humalog is 265 units (and not an error) Follow-up by: Corwin Levins MD,  July 26, 2010 2:24 PM  Additional Follow-up for Phone Call Additional follow up Details #1::        Pt did verify 265u of Humolog with evening meal and 140u of Levemir at Bedtime. Additional Follow-up by: Margaret Pyle, CMA,  July 26, 2010 2:43 PM    Additional Follow-up for Phone Call Additional follow up Details #2::    ok to increase the humalog to 280 units in the PM, cont all other dosing the same (humalog and levemir),  cont to monitor daily sugars, and call jan 3 with further resullts  ( or call sooner if low sugars) Follow-up by: Corwin Levins MD,  July 26, 2010 2:45 PM  Additional Follow-up for Phone Call Additional follow up Details #3:: Details for Additional Follow-up Action Taken: Pt advised and will call with CBG reading 08/07/2009 Additional Follow-up by: Margaret Pyle, CMA,  July 26, 2010 2:51 PM  New/Updated Medications: HUMALOG KWIKPEN 100 UNIT/ML  SOLN (INSULIN LISPRO (HUMAN)) three times a day (just before each meal) 70-40-280 units

## 2010-09-06 NOTE — Assessment & Plan Note (Signed)
Summary: 6 WK FU---STC   Vital Signs:  Patient profile:   55 year old female Height:      65 inches (165.10 cm) Weight:      236.25 pounds (107.39 kg) BMI:     39.46 O2 Sat:      95 % on Room air Temp:     97.6 degrees F (36.44 degrees C) oral Pulse rate:   95 / minute Pulse rhythm:   regular BP sitting:   98 / 52  (left arm) Cuff size:   large  Vitals Entered By: Brenton Grills CMA Duncan Dull) (August 23, 2010 8:03 AM)  O2 Flow:  Room air CC: 6 week F/U/aj Is Patient Diabetic? Yes   Referring Provider:  c. smith Primary Provider:  Caffie Damme  CC:  6 week F/U/aj.  History of Present Illness: pt states she feels well in general.  she brings a record of her cbg's which i have reviewed today.  it varies from 160-250.  it is in general llowest in the afternoon, and higher at other times of day.    Current Medications (verified): 1)  Digoxin 0.125 Mg Tabs (Digoxin) .Marland Kitchen.. 1 Tab Once Daily 2)  Requip 1 Mg Tabs (Ropinirole Hcl) .... Take 1 By Mouth At Bedtime 3)  Altace 5 Mg Cap (Ramipril) .... Take 1 By Mouth Two Times A Day 4)  Lipitor 40 Mg Tabs (Atorvastatin Calcium) .... One By Mouth Daily 5)  Celexa 20 Mg  Tabs (Citalopram Hydrobromide) .... 2 By Mouth Once Daily 6)  Buspirone Hcl 30 Mg  Tabs (Buspirone Hcl) .... Take 1 Tablet By Mouth Two Times A Day 7)  Imodium A-D 2 Mg  Tabs (Loperamide Hcl) .... As Needed 8)  Flexeril 10 Mg  Tabs (Cyclobenzaprine Hcl) .... One By Mouth Two Times A Day 9)  Nitrostat 0.4 Mg  Subl (Nitroglycerin) .... As Needed 10)  Levothyroxine Sodium 75 Mcg  Tabs (Levothyroxine Sodium) .... Take 1 Tablet By Mouth Once A Day 11)  Klor-Con M20 20 Meq  Tbcr (Potassium Chloride Crys Cr) .... 3 Tabs Two Times A Day 12)  Furosemide 20 Mg  Tabs (Furosemide) .... 2 Tabs Two Times A Day, May Take Extra Tab If Needed For Edema 13)  Humalog Kwikpen 100 Unit/ml  Soln (Insulin Lispro (Human)) .... Three Times A Day (Just Before Each Meal) 70-40-280 Units 14)  Levemir  Flexpen 100 Unit/ml  Soln (Insulin Detemir) .Marland KitchenMarland KitchenMarland Kitchen 140 Units At Night 15)  Coreg 25 Mg Tabs (Carvedilol) .... One By Mouth Bid 16)  Proventil Hfa 108 (90 Base) Mcg/act Aers (Albuterol Sulfate) .... 2 Puffs 4 Times Daily 17)  Nasonex 50 Mcg/act Susp (Mometasone Furoate) .... 2 Sprays Once Daily 18)  Trazodone Hcl 50 Mg Tabs (Trazodone Hcl) .... 2-3  Tablets By Mouth Daily 19)  Lovaza 1 Gm Caps (Omega-3-Acid Ethyl Esters) .... 4 Tabs Qd 20)  Protonix 40 Mg Tbec (Pantoprazole Sodium) .... Two Times A Day 21)  Spironolactone 25 Mg Tabs (Spironolactone) .Marland Kitchen.. 1 Tablet Daily 22)  Tylenol Pm .Marland KitchenMarland Kitchen. 1 At Bedtime 23)  Tylenol Arthritis .... 2 Q 6 Hrs 24)  Glucosamine 500 Mg Caps (Glucosamine Sulfate) .Marland Kitchen.. 1 Capsule By Mouth Once Daily 25)  Bd Pen Needle Short U/f 31g X 8 Mm Misc (Insulin Pen Needle) .Marland Kitchen.. 18/day  Allergies (verified): 1)  ! Asa 2)  ! Cipro 3)  ! Sulfa 4)  ! Codeine 5)  ! Naprosyn 6)  ! * Tape 7)  ! * Metals  Past History:  Past Medical History: Last updated: 05/30/2010  1. 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 cath 3/09. EF 45% minimal nonobstructive CAD      b. Previous EF 25%      c. ECHO 6/08 EF 55%      d. ECHO 5/10 EF 30-35%      e. MRI 7/10 EF 45%      f. Myoview 10/11: 59% no ischemia  2. History of breast cancer 2006 with return in 2008, s/p      right mastectomy.   3. Obesity  4. Hypertension.   5. History of tobacco use.   6. Osteoarthritis.   7. DM2   8. Anxiety and depression followed by a psychiatrist.   9. Hypothyroidism.   10.Asthma.   11.Obstructive sleep apnea with CPAP compliance.   12.GERD.   13.Gastroparesis with irritable bowel syndrome.   14.Restless leg syndrome  Review of Systems  The patient denies hypoglycemia.    Physical Exam  General:  morbidly obese.  no distress  Skin:  insulin injection sites at anterior abdomen are normal  Additional Exam:   Hemoglobin A1C       [H]  8.5 %     Impression &  Recommendations:  Problem # 1:  AODM (ICD-250.00)  Medications Added to Medication List This Visit: 1)  Humalog Kwikpen 100 Unit/ml Soln (Insulin lispro (human)) .... Three times a day (just before each meal) 80-40-290 units 2)  Levemir Flexpen 100 Unit/ml Soln (Insulin detemir) .Marland KitchenMarland KitchenMarland Kitchen 150 units at night  Other Orders: TLB-A1C / Hgb A1C (Glycohemoglobin) (83036-A1C) Est. Patient Level III (04540)  Patient Instructions: 1)  blood tests are being ordered for you today.  please call [phone] to hear your test results. 2)  pending the test results, please increase levemir to 150 units at night, and increase humalog to (just before each meal) 80-40-290 units. 3)  Please schedule a follow-up appointment in 6 weeks. 4)  check your blood sugar 3 times a day.  vary the time of day when you check, between before the 3 meals, and at bedtime.  also check if you have symptoms of your blood sugar being too high or too low.  please keep a record of the readings and bring it to your next appointment here.  please call us sooner if you are having low blood sugar episodes.    5)  (update: i left message on phone-tree:  rx as we discussed) Prescriptions: LEVEMIR FLEXPEN 100 UNIT/ML  SOLN (INSULIN DETEMIR) 150 units at night  #4 boxes x 11   Entered and Authorized by:   Minus Breeding MD   Signed by:   Minus Breeding MD on 08/23/2010   Method used:   Electronically to        [facility] (retail)       [address]       [place], [place]  [address]       Ph: [phone]       Fax: [phone]   RxID:   [id number] HUMALOG KWIKPEN 100 UNIT/ML  SOLN (INSULIN LISPRO (HUMAN)) three times a day (just before each meal) 80-40-290 units  #10 boxes x 11   Entered and Authorized by:   Minus Breeding MD   Signed by:   Minus Breeding MD on 08/23/2010   Method used:   Electronically to  [facility] (retail)       [address]       [place],  [place]  [address]       Ph: [phone]       Fax: [phone]   RxID:   [id number]    Orders Added: 1)  TLB-A1C / Hgb A1C (Glycohemoglobin) [83036-A1C] 2)  Est. Patient Level III [51884]

## 2010-09-20 ENCOUNTER — Telehealth: Payer: Self-pay | Admitting: Internal Medicine

## 2010-09-20 DIAGNOSIS — R002 Palpitations: Secondary | ICD-10-CM | POA: Insufficient documentation

## 2010-09-26 NOTE — Progress Notes (Signed)
Summary: dizzness  Phone Note Call from Patient Call back at [facility] Phone [phone]   Caller: Patient Reason for Call: Talk to Nurse Summary of Call: pt heart fluttering and dizziness. pt would like to talk to a nurse.  Initial call taken by: Roe Coombs,  September 20, 2010 3:23 PM  Follow-up for Phone Call        yest pt was driving home and heart started fluttering, became very dizzy had to pull over and let someone else drive, happened twice today, only last a few min. and she just feels tired afterwards, no CP, states her blood sugar has been high doesn't feel like it was r/ to that, doesn't know what BP is, will discuss w/Dr Bensimhon and call pt back Meredith Staggers, RN  September 20, 2010 4:05 PM   Additional Follow-up for Phone Call Additional follow up Details #1::        per Dr Gala Romney place 2 week event monitor, pt is aware, order placed Meredith Staggers, RN  September 20, 2010 4:20 PM   New Problems: PALPITATIONS (ICD-785.1)   New Problems: PALPITATIONS (ICD-785.1)

## 2010-09-28 ENCOUNTER — Encounter: Payer: Self-pay | Admitting: Internal Medicine

## 2010-10-02 ENCOUNTER — Encounter: Payer: Self-pay | Admitting: Internal Medicine

## 2010-10-03 ENCOUNTER — Encounter: Payer: Self-pay | Admitting: Internal Medicine

## 2010-10-04 ENCOUNTER — Encounter: Payer: Self-pay | Admitting: Endocrinology

## 2010-10-04 ENCOUNTER — Ambulatory Visit (INDEPENDENT_AMBULATORY_CARE_PROVIDER_SITE_OTHER): Payer: Medicare Other | Admitting: Endocrinology

## 2010-10-04 DIAGNOSIS — E119 Type 2 diabetes mellitus without complications: Secondary | ICD-10-CM

## 2010-10-08 ENCOUNTER — Telehealth: Payer: Self-pay | Admitting: Endocrinology

## 2010-10-11 NOTE — Assessment & Plan Note (Signed)
Summary: 6 wk f/u cd   Vital Signs:  Patient profile:   55 year old female Height:      65 inches (165.10 cm) Weight:      231.50 pounds (105.23 kg) BMI:     38.66 O2 Sat:      91 % on Room air Temp:     97.3 degrees F (36.28 degrees C) oral Pulse rate:   106 / minute BP sitting:   108 / 70  (left arm) Cuff size:   large  Vitals Entered By: Brenton Grills CMA (AAMA) (October 04, 2010 8:05 AM)  O2 Flow:  Room air CC: 6 week F/U/aj Is Patient Diabetic? Yes   Referring Provider:  c. smith Primary Provider:  Caffie Damme  CC:  6 week F/U/aj.  History of Present Illness: pt states she feels well in general, except for low-back pain.  she brings a record of her cbg's which i have reviewed today.  it varies from 100-low-200's.  it is highest at hs and in am, and lowest in the afternoon.    Current Medications (verified): 1)  Digoxin 0.125 Mg Tabs (Digoxin) .Marland Kitchen.. 1 Tab Once Daily 2)  Requip 1 Mg Tabs (Ropinirole Hcl) .... Take 1 By Mouth At Bedtime 3)  Altace 5 Mg Cap (Ramipril) .... Take 1 By Mouth Two Times A Day 4)  Lipitor 40 Mg Tabs (Atorvastatin Calcium) .... One By Mouth Daily 5)  Celexa 20 Mg  Tabs (Citalopram Hydrobromide) .... 2 By Mouth Once Daily 6)  Buspirone Hcl 30 Mg  Tabs (Buspirone Hcl) .... Take 1 Tablet By Mouth Two Times A Day 7)  Imodium A-D 2 Mg  Tabs (Loperamide Hcl) .... As Needed 8)  Flexeril 10 Mg  Tabs (Cyclobenzaprine Hcl) .... One By Mouth Two Times A Day 9)  Nitrostat 0.4 Mg  Subl (Nitroglycerin) .... As Needed 10)  Levothyroxine Sodium 75 Mcg  Tabs (Levothyroxine Sodium) .... Take 1 Tablet By Mouth Once A Day 11)  Klor-Con M20 20 Meq  Tbcr (Potassium Chloride Crys Cr) .... 3 Tabs Two Times A Day 12)  Furosemide 20 Mg  Tabs (Furosemide) .... 2 Tabs Two Times A Day, May Take Extra Tab If Needed For Edema 13)  Humalog Kwikpen 100 Unit/ml  Soln (Insulin Lispro (Human)) .... Three Times A Day (Just Before Each Meal) 80-40-290 Units 14)  Levemir Flexpen 100  Unit/ml  Soln (Insulin Detemir) .Marland KitchenMarland KitchenMarland Kitchen 150 Units At Night 15)  Coreg 25 Mg Tabs (Carvedilol) .... One By Mouth Bid 16)  Proventil Hfa 108 (90 Base) Mcg/act Aers (Albuterol Sulfate) .... 2 Puffs 4 Times Daily 17)  Nasonex 50 Mcg/act Susp (Mometasone Furoate) .... 2 Sprays Once Daily 18)  Trazodone Hcl 50 Mg Tabs (Trazodone Hcl) .... 2-3  Tablets By Mouth Daily 19)  Lovaza 1 Gm Caps (Omega-3-Acid Ethyl Esters) .... 4 Tabs Qd 20)  Protonix 40 Mg Tbec (Pantoprazole Sodium) .... Two Times A Day 21)  Spironolactone 25 Mg Tabs (Spironolactone) .Marland Kitchen.. 1 Tablet Daily 22)  Tylenol Pm .Marland KitchenMarland Kitchen. 1 At Bedtime 23)  Tylenol Arthritis .... 2 Q 6 Hrs 24)  Glucosamine 500 Mg Caps (Glucosamine Sulfate) .Marland Kitchen.. 1 Capsule By Mouth Once Daily 25)  Bd Pen Needle Short U/f 31g X 8 Mm Misc (Insulin Pen Needle) .Marland Kitchen.. 18/day  Allergies (verified): 1)  ! Asa 2)  ! Cipro 3)  ! Sulfa 4)  ! Codeine 5)  ! Naprosyn 6)  ! * Tape 7)  ! * Metals  Past  History:  Past Medical History: Last updated: 05/30/2010  1. 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 cath 3/09. EF 45% minimal nonobstructive CAD      b. Previous EF 25%      c. ECHO 6/08 EF 55%      d. ECHO 5/10 EF 30-35%      e. MRI 7/10 EF 45%      f. Myoview 10/11: 59% no ischemia  2. History of breast cancer 2006 with return in 2008, s/p      right mastectomy.   3. Obesity  4. Hypertension.   5. History of tobacco use.   6. Osteoarthritis.   7. DM2   8. Anxiety and depression followed by a psychiatrist.   9. Hypothyroidism.   10.Asthma.   11.Obstructive sleep apnea with CPAP compliance.   12.GERD.   13.Gastroparesis with irritable bowel syndrome.   14.Restless leg syndrome  Review of Systems  The patient denies hypoglycemia.         she has had several episodes of palpitations in the afternoon.  she has an event monitor  Physical Exam  General:  morbidly obese.  no distress.  Pulses:  dorsalis pedis intact bilat.   Extremities:  no  deformity.  no ulcer on the feet.  feet are of normal color and temp.   trace right pedal edema, trace left pedal edema, and mycotic toenails.   Neurologic:  sensation is intact to touch on the feet, but decreased from normal    Impression & Recommendations:  Problem # 1:  AODM (ICD-250.00) characterized by severe insulin resistance she needs some adjustment in her therapy although she hasn't found any hypoglycemia in the afternoon, we need to minimize the risk of that.    Medications Added to Medication List This Visit: 1)  Humalog Kwikpen 100 Unit/ml Soln (Insulin lispro (human)) .... Three times a day (just before each meal) 80-30-310 units  Other Orders: Est. Patient Level III (40981)  Patient Instructions: 1)  blood tests are being ordered for you today.  please call [phone] to hear your test results. 2)  pending the test results, please continue levemir 150 units at night, and change humalog to (just before each meal) 80-30-310 units. 3)  Please schedule a follow-up appointment in 6 weeks. 4)  check your blood sugar 3 times a day.  vary the time of day when you check, between before the 3 meals, and at bedtime.  also check if you have symptoms of your blood sugar being too high or too low.  please keep a record of the readings and bring it to your next appointment here.  please call us sooner if you are having low blood sugar episodes.      Orders Added: 1)  Est. Patient Level III [95621]

## 2010-10-11 NOTE — Letter (Signed)
Summary: CHF Program  CHF Program   Imported By: Harlon Flor 10/04/2010 16:19:33  _____________________________________________________________________  External Attachment:    Type:   Image     Comment:   External Document

## 2010-10-16 NOTE — Progress Notes (Signed)
Summary: rx refill req  Phone Note Refill Request Message from:  Pharmacy on October 08, 2010 12:01 PM  Refills Requested: Medication #1:  FLEXERIL 10 MG  TABS one by mouth two times a day   Dosage confirmed as above?Dosage Confirmed   Last Refilled: 07/26/2010  Method Requested: Electronic Next Appointment Scheduled: 11/22/2010 Initial call taken by: Brenton Grills CMA (AAMA),  October 08, 2010 12:01 PM    Prescriptions: FLEXERIL 10 MG  TABS (CYCLOBENZAPRINE HCL) one by mouth two times a day  #60 x 5   Entered by:   Brenton Grills CMA (AAMA)   Authorized by:   Minus Breeding MD   Signed by:   Brenton Grills CMA (AAMA) on 10/08/2010   Method used:   Electronically to        [facility] (retail)       [address]       [place], [place]  [address]       Ph: [phone]       Fax: [phone]   RxID:   [id number]

## 2010-10-17 LAB — LIPID PANEL
HDL: 38 mg/dL — ABNORMAL LOW (ref >39)
LDL Cholesterol: UNDETERMINED mg/dL (ref 0–99)
Total CHOL/HDL Ratio: 3.8 RATIO
Triglycerides: 685 mg/dL — ABNORMAL HIGH (ref <150)
VLDL: UNDETERMINED mg/dL (ref 0–40)

## 2010-10-17 LAB — BASIC METABOLIC PANEL
BUN: 19 mg/dL (ref 6–23)
CO2: 22 mEq/L (ref 19–32)
Calcium: 9.2 mg/dL (ref 8.4–10.5)
Creatinine, Ser: 1.08 mg/dL (ref 0.4–1.2)
GFR calc non Af Amer: 53 mL/min — ABNORMAL LOW (ref >60)
Glucose, Bld: 313 mg/dL — ABNORMAL HIGH (ref 70–99)

## 2010-10-17 LAB — GLUCOSE, CAPILLARY
Glucose-Capillary: 218 mg/dL — ABNORMAL HIGH (ref 70–99)
Glucose-Capillary: 277 mg/dL — ABNORMAL HIGH (ref 70–99)

## 2010-10-18 LAB — T4, FREE: Free T4: 1.17 ng/dL (ref 0.80–1.80)

## 2010-10-18 LAB — CARDIAC PANEL(CRET KIN+CKTOT+MB+TROPI)
Relative Index: INVALID (ref 0.0–2.5)
Total CK: 52 U/L (ref 7–177)

## 2010-10-18 LAB — BRAIN NATRIURETIC PEPTIDE: Pro B Natriuretic peptide (BNP): 39 pg/mL (ref 0.0–100.0)

## 2010-10-18 LAB — HEMOGLOBIN A1C: Hgb A1c MFr Bld: 9.6 % — ABNORMAL HIGH (ref <5.7)

## 2010-10-18 LAB — COMPREHENSIVE METABOLIC PANEL
ALT: 26 U/L (ref 0–35)
Alkaline Phosphatase: 52 U/L (ref 39–117)
Chloride: 97 mEq/L (ref 96–112)
Glucose, Bld: 195 mg/dL — ABNORMAL HIGH (ref 70–99)
Potassium: 4.2 mEq/L (ref 3.5–5.1)
Sodium: 135 mEq/L (ref 135–145)
Total Bilirubin: 0.8 mg/dL (ref 0.3–1.2)
Total Protein: 7.1 g/dL (ref 6.0–8.3)

## 2010-10-18 LAB — CBC
HCT: 36.3 % (ref 36.0–46.0)
Hemoglobin: 11.8 g/dL — ABNORMAL LOW (ref 12.0–15.0)
MCH: 28.4 pg (ref 26.0–34.0)
MCHC: 32.5 g/dL (ref 30.0–36.0)
MCV: 87.3 fL (ref 78.0–100.0)

## 2010-10-18 LAB — CK TOTAL AND CKMB (NOT AT ARMC): Total CK: 54 U/L (ref 7–177)

## 2010-10-18 LAB — DIGOXIN LEVEL: Digoxin Level: 0.5 ng/mL — ABNORMAL LOW (ref 0.8–2.0)

## 2010-10-18 LAB — PROTIME-INR: Prothrombin Time: 14.1 seconds (ref 11.6–15.2)

## 2010-10-23 NOTE — Letter (Signed)
Summary: CHF Program  CHF Program   Imported By: Harlon Flor 10/11/2010 08:24:36  _____________________________________________________________________  External Attachment:    Type:   Image     Comment:   External Document

## 2010-10-23 NOTE — Letter (Signed)
Summary: CHF Program  CHF Program   Imported By: Harlon Flor 10/11/2010 08:20:54  _____________________________________________________________________  External Attachment:    Type:   Image     Comment:   External Document

## 2010-10-24 LAB — BASIC METABOLIC PANEL
CO2: 25 mEq/L (ref 19–32)
Chloride: 100 mEq/L (ref 96–112)
Creatinine, Ser: 0.71 mg/dL (ref 0.4–1.2)
GFR calc Af Amer: >60 mL/min (ref >60)
Potassium: 4 mEq/L (ref 3.5–5.1)
Sodium: 135 mEq/L (ref 135–145)

## 2010-10-24 LAB — DIFFERENTIAL
Basophils Relative: 1 % (ref 0–1)
Eosinophils Absolute: 0.2 10*3/uL (ref 0.0–0.7)
Lymphs Abs: 2.9 10*3/uL (ref 0.7–4.0)
Monocytes Absolute: 0.6 10*3/uL (ref 0.1–1.0)
Monocytes Relative: 6 % (ref 3–12)
Neutrophils Relative %: 63 % (ref 43–77)

## 2010-10-24 LAB — BLOOD GAS, VENOUS
FIO2: 0.21 %
O2 Saturation: 96.3 %
Patient temperature: 98.6

## 2010-10-24 LAB — URINALYSIS, ROUTINE W REFLEX MICROSCOPIC
Bilirubin Urine: NEGATIVE
Glucose, UA: 500 mg/dL — AB
Hgb urine dipstick: NEGATIVE
Specific Gravity, Urine: 1.015 (ref 1.005–1.030)

## 2010-10-24 LAB — GLUCOSE, CAPILLARY: Glucose-Capillary: 184 mg/dL — ABNORMAL HIGH (ref 70–99)

## 2010-10-24 LAB — CBC
Hemoglobin: 12.6 g/dL (ref 12.0–15.0)
MCHC: 33.4 g/dL (ref 30.0–36.0)
MCV: 86.4 fL (ref 78.0–100.0)
RBC: 4.38 MIL/uL (ref 3.87–5.11)
WBC: 10.2 10*3/uL (ref 4.0–10.5)

## 2010-11-05 ENCOUNTER — Telehealth: Payer: Self-pay | Admitting: Internal Medicine

## 2010-11-05 NOTE — Telephone Encounter (Signed)
Pt given Holter monitor results: NSR/ST no arrhythmias, faxed to Gaye Alken, NP at [facility] per pt request

## 2010-11-07 ENCOUNTER — Other Ambulatory Visit: Payer: Self-pay | Admitting: *Deleted

## 2010-11-07 MED ORDER — POTASSIUM CHLORIDE CRYS ER 20 MEQ PO TBCR: 60.0000 meq | EXTENDED_RELEASE_TABLET | Freq: Two times a day (BID) | ORAL | Status: DC

## 2010-11-11 LAB — BASIC METABOLIC PANEL
Calcium: 9.7 mg/dL (ref 8.4–10.5)
GFR calc Af Amer: >60 mL/min (ref >60)
GFR calc non Af Amer: >60 mL/min (ref >60)
Potassium: 3.9 mEq/L (ref 3.5–5.1)
Sodium: 136 mEq/L (ref 135–145)

## 2010-11-22 ENCOUNTER — Ambulatory Visit: Payer: Medicare Other | Admitting: Endocrinology

## 2010-11-29 ENCOUNTER — Other Ambulatory Visit: Payer: Self-pay | Admitting: *Deleted

## 2010-11-29 MED ORDER — SPIRONOLACTONE 25 MG PO TABS: 25.0000 mg | ORAL_TABLET | Freq: Every day | ORAL | Status: DC

## 2010-12-18 NOTE — Assessment & Plan Note (Signed)
[facility]                          CHRONIC HEART FAILURE NOTE   [name], [name]                    MRN:          [id number]  DATE:03/05/2007                            DOB:          [birth year]    [name] returns today for further followup of her congestive heart  failure which is secondary to nonischemic cardiomyopathy associated with  chemotherapeutic agents. [name] states that she has been doing  okay. She is experiencing some discomfort in her right breast area. She  describes it as not a pain, but a tingling sensation. She has a followup  appointment with Dr.  Donnie Coffin next week. She also complains of some  discomfort in her left arm. She describes it as a numbness and a  tingling down her left arm and to her hand. It is not associated with  any shortness of breath or chest discomfort. She also describes  increased neuropathy in her lower extremities up to her knees. She  states that her CBGs have been running between 250 and 275. She denies  any orthopnea or PND. However, on further discussion, she generally  sleeps on two pillows and states that occasionally over the last few  months or so she has been sleeping on three pillows at times, but not  consistently. She states that compliance with her medications.   PAST MEDICAL HISTORY:  1. Congestive heart failure secondary to nonischemic cardiomyopathy      with an ejection fraction of 20% by cardiac catheterization in      2006.      a.     Improvement in ejection fraction by tissue Doppler in 2006       as part of the Compare study, ejection fraction was calculated at       45-50%.      b.     Status post 2D echocardiogram in June 2008, shows an       ejection fraction of 55%.  2. Non-obstructive coronary artery disease by catheterization in 2006.      a.     Status post stress Myoview in December 2007 showed an       ejection fraction of 40% with no evidence of ischemia.  3.  Previous diagnosis of breast cancer in 2006, with return of breast      cancer in March of 2008, status post right mastectomy complicated      by postoperative surgical infection.  4. Hypertension.  5. History of tobacco use. The patient started smoking at the age of      61. She smoked up to 2-1/2 packs per day until she quit in 2003.  6. Osteoarthritis.  7. History of chemotherapy treatment in 2006 (chemotherapy agent FEC).      The patient initially had a lumpectomy followed by chemotherapy      consisting of seven treatments, the last one being in April 2006,      followed by radiation for which the patient underwent 35 treatments      with the last treatment being February 21, 2005.  8. Irritable bowel syndrome.  9.  Arthritis.  10.History of back pains/L4 herniated disc.  11.Depression.  12.Hypothyroidism.  13.History of sleep apnea followed by Dr.  Shelle Iron.  14.History of osteoarthritis.  15.MULTIPLE ALLERGIES INCLUDING ASPIRIN, CIPRO, SULFA, NSAIDS AND      DECADRON.  16.Chronic obstructive pulmonary disease/asthma.   REVIEW OF SYSTEMS:  As stated above.   CURRENT MEDICATIONS:  1. Levothyroxine 50 mcg daily.  2. Januvia 100 mg daily.  3. Altace 7.5 mg daily. The patient also takes 5 mg in the evening.  4. Levemir 18 cc subcutaneous daily.  5. Proventil inhaler two puffs.  6. Darvocet 650 mg b.i.d. p.r.n.  7. Lipitor 20 mg daily.  8. Requip 1 mg at bedtime.  9. Celexa 60 mg daily.  10.Trazodone 50 mg q nightly.  11.Coreg 12.5 mg b.i.d.  12.Buspirone 15 mg b.i.d.  13.P.r.n. medications include Proventil inhaler, Atrovent inhaler,      Darvocet, Imodium and Flexeril.   PHYSICAL EXAMINATION:  Weight 224 pounds, blood pressure 93/63 with a  pulse of 98. [name] is in no acute distress.  She has jugular venous distention 8-10 cm at a 45 degree angle.  LUNGS:  Clear to auscultation.  CARDIOVASCULAR: Reveals an S1, S2, regular rate and rhythm.  ABDOMEN: Soft and nontender.  Mild distention.  LOWER EXTREMITIES: Without clubbing, cyanosis. She has a trace of  nonpitting edema bilaterally.  SKIN: Warm and dry.   IMPRESSION:  Congestive heart failure secondary to nonischemic  cardiomyopathy with most recent echocardiogram showing an ejection  fraction within normal limits. Will continue current medications. [name]  has some mild fluid retention at this time. Will increase furosemide to  60 mg in the morning and 40 in the evening. Check BMET and a BNP today.  See her back in one week. Also, concerned about the chest discomfort  [name] is having. Last stress test was in December of 2007. I am going  to reschedule her for a repeat stress Myoview and have her followup with  Dr.  Gala Romney, her primary cardiologist.   ADDENDUM:  A 12-lead EKG done here in the office today shows normal  sinus rhythm with a mildly prolonged Q-T of 398 and a heart rate of 98.  No acute ST-T wave changes.      [name], [name]  Electronically Signed      Joanne Buckles. Bensimhon, MD  Electronically Signed   MB/MedQ  DD: 03/05/2007  DT: 03/05/2007  Job #: [id number]   cc:   Pierce Crane, M.D.  Vira Browns, MD

## 2010-12-18 NOTE — Assessment & Plan Note (Signed)
[facility]                            CARDIOLOGY OFFICE NOTE   Joanne, [name]                    MRN:          [id number]  DATE:03/28/2008                            DOB:          1956-01-29    PRIMARY CARE PHYSICIAN:  Dr. Caffie Damme.   ONCOLOGIST:  Pierce Crane, MD.   INTERVAL HISTORY:  [name] is a 55 year old woman with a history of  congestive heart failure secondary to nonischemic cardiomyopathy.  EF  previously 25%, now 45-50%.  She has nonobstructive coronary artery  disease by catheterization.  She just had one recently as well.  She  also has a history of breast cancer, hypertension, obesity, diabetes,  and obstructive sleep apnea.  She returns today for routine followup.   Overall, she says, she is doing very well.  She says it is much less  stress after getting on disability.  Her breathing is much better.  At  her last visit, though we tried to increase her Coreg, but she could not  tolerate this due to extreme fatigue.  We now bump that back down.  She  denies any orthopnea and no PND.  No significant lower extremity edema.  She is down 5 pounds.   CURRENT MEDICATIONS:  1. Ramipril 5 b.i.d.  2. Lantus insulin.  3. Synthroid 75 a day.  4. Albuterol.  5. Rhinocort.  6. Imodium.  7. Prevacid.  8. Reglan.  9. Buspirone.  10.Citalopram 60 a day.  11.Trazodone.  12.Lipitor 20 a day.  13.Coreg 25 b.i.d.  14.Lasix 60 b.i.d.  15.ReQuip.  16.Digoxin 0.125 a day.  17.CPAP.   PHYSICAL EXAMINATION:  GENERAL:  She had no acute distress, ambulates in  the clinic without respiratory difficulty.  Affect is pleasant.  VITAL SIGNS:  Blood pressure is 88/64, heart rate is 91, weight is 217;  she is down 5 pounds.  HEENT:  Normal.  NECK:  Supple.  No obvious JVD.  Carotids are 2+ bilaterally.  No  bruits.  There is no lymphadenopathy or thyromegaly.  CARDIAC:  Regular rate and rhythm.  No murmurs, rubs, or gallops.  LUNGS:   Clear.  ABDOMEN:  Obese, nontender, nondistended.  No hepatosplenomegaly, no  bruits, and no masses.  Good bowel sounds.  EXTREMITIES:  Warm with no cyanosis, clubbing, or edema.  No rash.  NEURO:  Alert and oriented x3.  Cranial nerves II through XII are  intact.  Moves all 4 extremities without difficulty.   EKG shows sinus rhythm, rate at 91, and no ST-T wave abnormalities.   ASSESSMENT AND PLAN:  1. Congestive heart failure secondary to nonischemic cardiomyopathy.      Ejection fraction is much improved.  Volume status looks good.  She      is [place] Class II.  Continue current therapy.  She will follow up in      the Heart Failure Clinic in 3 months and see me in 6 months.  2. Blood pressure.  Blood pressure is a little bit on the low side but      she is asymptomatic with this.  We will  keep her right where she is      at.  3. History of chest pain.  Coronaries looked quite good on recent      catheterization.   DISPOSITION:  I will see her back in several months for routine followup  as above.     Joanne Buckles. Bensimhon, MD  Electronically Signed    DRB/MedQ  DD: 03/28/2008  DT: 03/29/2008  Job #: [id number]   cc:   Caffie Damme, MD  Pierce Crane, MD

## 2010-12-18 NOTE — Assessment & Plan Note (Signed)
[facility]                          CHRONIC HEART FAILURE NOTE   [name], [name]                    MRN:          [id number]  DATE:01/23/2007                            DOB:          [birth year]    CHRONIC HEART FAILURE CLINIC NOTE   [name] returns today for followup of her congestive heart failure,  which is secondary to nonischemic cardiomyopathy associated with  chemotherapeutic agents, which were used to treat her breast cancer back  in 2006.  [name] states it has been a rather stressful week.  Her  mother is in the hospital status post surgical repair and intervention  of her back.  [name] states she has been doing well, but it has been  rather stressful going back and forth from the hospital for her.  She  complains of ongoing fatigue, some mild peripheral edema.  No episodes  of chest discomfort.  She has experienced some increased GERD type  symptoms, but states her diet has not been very consistent with her  mother in the hospital again, and ongoing shortness of breath without  any worsening.  She states she underwent an upper GI workup last week by  Dr. Charm Barges and her Prilosec was evidently discontinued.  She was started  on Prevacid for her GERD, per the patient's report.  Otherwise, she has  been doing okay.  In talking with [name], apparently, she had some  episodes of volume overload approximately 2 weeks ago.  She states she  did not take her Lasix at all for 2 days, for whatever reason, and  experienced some peripheral edema and orthopnea.  Slept on 2 pillows for  several nights.  Resumed her Lasix and symptoms resolved, and she feels  back to baseline at this time.  She does think her weight is up today  because her appetite has been higher when she is stressed out, and she  has a tendency to eat things that are not appropriate.   PAST MEDICAL HISTORY:  1. Includes congestive heart failure secondary to  nonischemic      cardiomyopathy with an EF of 20% by cardiac catheterization in 2006      with an improvement in EF to 45-50% by tissue Doppler in 2006 as a      compare study.  2. Nonobstructive coronary artery disease by catheterization in July      of 2006.      a.     Status post stress Myoview in December of 2007 showing an EF       improved to 40% with no evidence of ischemia.  3. Previous diagnosis of breast cancer in 2006 with return of breast      cancer in March of 2008 status post right mastectomy, complicated      by postoperative surgical infection.  4. Hypertension.  5. History of tobacco use.  6. Osteoarthritis.  7. History of chemotherapy treatment in 2006 for initial breast      cancer.  8. Anxiety and depression.  9. Hypothyroidism.  10.Asthma.  11.Obstructive sleep apnea with CPAP use.  12.Diabetes.  13.GERD status post recent GI workup.   ALLERGIES:  ASPIRIN, CIPRO, SULFA, CODEINE, NAPROSYN.   REVIEW OF SYSTEMS:  As stated above.   CURRENT MEDICATIONS:  1. Levothyroxine 50 mcg daily.  2. Januvia 100.  3. KCl the patient should be taking 40 mEq t.i.d.  She states she is      taking 40 in the morning 40 at noon and 20 in the evening.  4. Prevacid 30 mg daily.  5. Altace 7.5 mg daily.  The patient takes the 7.5 mg q. a.m. and 5 mg      q. p.m. daily.  6. Levemir 18 cc subcutaneous daily.  7. Proventil inhaler 2 puffs daily.  8. Perphenazine 650 mg b.i.d.  9. Lipitor 20 mg daily.  10.Requip 1 mg nightly.  11.Celexa 6 mg daily.  12.Trazodone 50 mg nightly  13.Coreg 12.5 mg b.i.d.  14.Buspirone 15 mg b.i.d.   P.r.n. medications include Proventil inhaler.  Darvocet.  Atrovent.  Imodium.  Flexeril.   PHYSICAL EXAM:  Weight 223 pounds, blood pressure 113/78, heart rate  initially recorded at 98, rechecked apical pulse 82.  [name] is in no acute distress.  She looks tired today.  She  states she did not get much sleep last night.  She ambulates with  the  assistance of her cane still.  No jugular venous distension noted at 45 degree angle.  CARDIOVASCULAR:  S1, S2 regular rate and rhythm.  Slightly tachycardic  with exertion.  No murmurs, rubs, or gallops noted.  LUNGS:  Clear.  ABDOMEN:  Obese, soft, and nontender.  Positive bowel sounds.  LOWER EXTREMITIES:  Without cyanosis, clubbing, or edema.  NEURO:  She is alert and oriented x3.  Ambulating with the assistance of  a cane.   IMPRESSION:  Congestive heart failure secondary to nonischemic  cardiomyopathy.  The patient saw Dr. Gala Romney recently for routine  cardiology visit.  Noted the patient mildly tachycardic at that time.  Lab work was obtained, H&H 12.7 and 37.7, potassium 3.8.  BUN and  creatinine 14 and 0.7.  BNP was 40.  TSH 2.86, free T4 0.6.  Also, I  have just been given a copy of a 2D echocardiogram that we did June 8  showing ejection fraction to 55% and otherwise unremarkable.  Will  continue to monitor [name].  I will consider adding digoxin to her  medical regimen.  I am going to have her check her pulse at home and  record it when she is able to see exactly what her heart rate is running  outside of here.  She does state she notices her heart beats faster when  she is stressed, has not had enough sleep, or does not eat  appropriately.  I am going to have her continue current medications and  see her back in a few weeks.      Dorian Pod, ACNP  Electronically Signed      Bevelyn Buckles. Bensimhon, MD  Electronically Signed   MB/MedQ  DD: 01/23/2007  DT: 01/23/2007  Job #: [id number]   cc:   Pierce Crane, M.D.  Clinton D. Maple Hudson, MD, FCCP, FACP

## 2010-12-18 NOTE — Assessment & Plan Note (Signed)
[facility]                          CHRONIC HEART FAILURE NOTE   NAME:[name], Name                    MRN:          [id number]  DATE:03/05/2007                            DOB:          May 08, 1956    ADDENDUM:  A 12-lead EKG done here in the office today shows normal  sinus rhythm with a mildly prolonged Q-T of 398 and a heart rate of 98.  No acute ST-T wave changes.      Dorian Pod, ACNP  Electronically Signed      Bevelyn Buckles. Bensimhon, MD  Electronically Signed   MB/MedQ  DD: 03/05/2007  DT: 03/05/2007  Job #: [id number]   cc:   Pierce Crane, M.D.  Dr. Vira Browns

## 2010-12-18 NOTE — Assessment & Plan Note (Signed)
[facility]                          CHRONIC HEART FAILURE NOTE   [name], [name]                    MRN:          [id number]  DATE:08/10/2007                            DOB:          [birth year]    PRIMARY CARE:  Dr. Vira Browns.   PULMONOLOGY:  Barbaraann Share, MD.   PRIMARY CARDIOLOGIST:  Bevelyn Buckles. Bensimhon, MD.   ONCOLOGY:  Pierce Crane, MD.   Lashayla returns today for follow-up of her congestive heart failure,  which is secondary to nonischemic cardiomyopathy with an EF currently 45-  50%, improved from previous 25%, confirmed by echocardiogram.  [name]  states she has been doing okay over the last few months.  She complains  of ongoing fatigue and diabetes which has been difficult to manage.  She  states she has a follow-up appointment with Dr. Everardo All on January 8 for  reevaluation of her diabetes.  She had good results, recently underwent  a PET scan and mammogram that she states was okay, previously had been  concerned about a possible mass in her left lung.  She states her CBGs  have been running between 200 and 320.  She has been compliant with her  diabetic medications pending further evaluation by Dr. Everardo All.  She  does complain of some mild abdominal bloating.  Otherwise denies any  orthopnea, PND, peripheral edema, presyncope or syncopal episodes or  chest discomfort.   PAST MEDICAL HISTORY:  1. Congestive heart failure secondary to nonischemic cardiomyopathy      with EF currently 50% by echocardiogram.  Previous participant in      the COMPARE study.  2. Nonobstructive CAD, status post catheterization in 2006.      a.     Status post stress Myoview in December 2007 showing EF 40%       with no evidence of ischemia.  3. Previous diagnosis of breast cancer in 2006 with return of breast      cancer in March 2008.      a.     This status post right mastectomy complicated by       postoperative surgical infection at that  time.  4. Hypertension.  5. History of tobacco use.  6. Osteoarthritis.  7. History of chemotherapy treatment in 2006 at initial diagnosis of      breast cancer.  8. Anxiety and depression.  9. Hypothyroidism.  10.Asthma.  11.Obstructive sleep apnea with CPAP compliance.  12.Diabetes, managed by Dr. Everardo All.  13.GERD, gastroparesis, irritable bowel syndrome.  14.Ongoing atypical chest discomfort.  15.Recent workup for questionable left lung nodule.  The patient      states results were okay..   ALLERGIES:  ASPIRIN, CIPRO, SULFA, CODEINE, NAPROSYN.   CURRENT MEDICATIONS:  1. Levothyroxine 75 mcg daily.  2. Januvia 100 mg daily off.  3. Propoxyphene 650 mg b.i.d.  4. Lipitor 20 mg.  5. Requip 1 mg q.h.s.  6. Celexa 60 mg daily.  7. Coreg 18.375 mg b.i.d.  8. Altace 7.5 mg in the a.m. and 5 mg in the p.m.  9. Buspirone 30 mg b.i.d.  PHYSICAL EXAM:  Weight 225 pounds, weight is up 5 pounds, blood pressure  99/63 and 90/58 manual with a heart rate of 104.  [name] is in no acute distress.  She has jugular vein distention 8 cm at a 45-degrees angle.  LUNGS:  Clear to auscultation with poor inspiratory effort.  CARDIOVASCULAR:  S1-S2 tachycardic.  ABDOMEN:  Soft, nontender, positive bowel sounds, obese.  LOWER EXTREMITIES:  Without clubbing or cyanosis.  She has a trace of  edema bilaterally.  NEUROLOGIC:  Alert and oriented x3, ambulating with assistance of a  cane.  SKIN:  Warm and dry.   A 12-lead EKG showing sinus tachycardia with a leftward axis deviation.   IMPRESSION:  Congestive heart failure secondary to nonischemic  cardiomyopathy with signs of mild volume overload.  The patient is still  mildly tachycardic.  I initiated digoxin at her last office visit.  We  will check a TSH today and a BNP and BMET.  Have her take an additional  20  mg of Lasix when she returns home today, increase her Coreg to 25 mg  b.i.d. and decrease her Altace back to 5 mg b.i.d. as her  blood pressure  is mildly hypotensive.      Dorian Pod, ACNP  Electronically Signed      Rollene Rotunda, MD, [facility]  Electronically Signed   MB/MedQ  DD: 08/10/2007  DT: 08/10/2007  Job #: [id number]   cc:   Vira Browns, MD  Barbaraann Share, MD,FCCP  Bevelyn Buckles. Bensimhon, MD  Pierce Crane, MD

## 2010-12-18 NOTE — Assessment & Plan Note (Signed)
[facility]                          CHRONIC HEART FAILURE NOTE   AIDEL, DAVISSON                    MRN:          [id number]  DATE:11/26/2007                            DOB:          18-Mar-1956    PRIMARY CARE PHYSICIAN:  Dr. Vira Browns at the [facility].   ONCOLOGIST:  Dr. Pierce Crane.   PRIMARY CARDIOLOGIST:  Dr. Nicholes Mango.   HISTORY OF PRESENT ILLNESS:  [name] returns today for follow-up of her  congestive heart failure secondary to nonischemic cardiomyopathy with EF  currently 45% status post cardiac catheterization.  EF previously as low  as 25%.  [name] has been doing quite well.  She denies any new  complaints today.  She is very relieved that she was recently accepted  for disability in regards to her medical expenses.  She states this has  been a big weight taken off her shoulders.  She denies any symptoms  suggestive of volume overload.  She complains of some seasonal  allergies, otherwise, no new problems.   PAST MEDICAL HISTORY:  1. Congestive heart failure secondary to nonischemic cardiomyopathy      with EF currently 45%  2. Nonobstructive CAD by cath.  3. Breast cancer in 2006 with return of breast cancer March 2008      status post right mastectomy.  4. Hypertension.  5. History of tobacco use.  6. Osteoarthritis.  7. History of chemotherapy in 2006.  8. Anxiety depression.  9. Hypothyroidism.  10.Asthma.  11.Obstructive sleep apnea with CPAP compliance.  12.Diabetes.  13.GERD.  14.Gastroparesis irritable bowel syndrome.   REVIEW OF SYSTEMS:  As stated above, otherwise negative.   CURRENT MEDICATIONS:  1. Coreg 25 mg b.i.d.  2. Ramipril 5 mg b.i.d.  3. Lantus insulin as directed.  4. Levothyroxine 75 mcg daily.  5. Albuterol 2 puffs q.i.d.  6. Rhinocort nasal spray.  7. Imodium.  8. Prevacid 30 mg b.i.d.  9. Reglan 10 mg t.i.d.  10.BuSpar 30 mg b.i.d.  11.Celexa 20 mg 3 tablets daily.  12.Trazodone 50 mg 2 tablets q.h.s.  13.Lipitor 20 mg daily.   PHYSICAL EXAMINATION:  VITAL SIGNS:  Weight 222 pounds, blood pressure  127/76, heart rate usually recorded at 114, apical pulse 102.  GENERAL:  [name] is in no acute distress.  HEENT:  Normal.  NECK:  Supple without JVD.  LUNGS:  Clear to auscultation bilaterally.  CARDIOVASCULAR:  Reveals S1-S2 tachycardic.  ABDOMEN:  Soft, nontender, obese, positive bowel sounds.  LOWER EXTREMITIES:  Without clubbing, cyanosis or edema.  NEUROLOGICAL:  Alert and oriented x3.   IMPRESSION:  Congestive heart failure without signs of volume overload.  The patient is tachycardia.  Today, she routinely remains tachycardiac.  Heart rate is slightly higher today and regular.  She is already on 25  of Coreg b.i.d. and digoxin 0.25 mg.  Would consider increasing her  digoxin to 0.375 mg.  I will touch base with Dr. Antoine Poche before I make  this change.  She has had a TSH within normal limits back in February.  Anjali does have some problems with anxiety.  I suspect this is more  related to that than her cardiac status.      Dorian Pod, ACNP  Electronically Signed      Rollene Rotunda, MD, [facility]  Electronically Signed   MB/MedQ  DD: 11/26/2007  DT: 11/26/2007  Job #: [id number]   cc:   Dr. Vira Browns

## 2010-12-18 NOTE — Assessment & Plan Note (Signed)
[facility]                          CHRONIC HEART FAILURE NOTE   [name], [name]                    MRN:          [id number]  DATE:04/21/2007                            DOB:          1955/12/22    PRIMARY CARE PHYSICIAN:  Dr. Vira Browns.   PULMONOLOGIST:  Barbaraann Share, M.D., [facility].   PRIMARY CARDIOLOGIST:  Bevelyn Buckles. Bensimhon, M.D.   ONCOLOGY:  Pierce Crane, M.D.   [name] returns today for followup of her congestive heart failure,  which is secondary to nonischemic cardiomyopathy with an EF of  previously 20% and most recently improved to 45-50% by echocardiogram.  [name] is a very unfortunate young lady in that she has multiple  medical problems.  Previous diagnosis of breast cancer in 2006 with  return of breast cancer in March of 2008.  Sidney states that she has  been having a difficult time managing her diabetes.  Was just recently  started on a new medication for this because her sugars have been  extremely elevated.  She also complains of ongoing GERD and  gastroparesis discomfort.  Chronic back pain secondary to her arthritis.  She also has irritable bowel syndrome.  She states she has had 3 days of  intense diarrhea that is not responding to her Imodium that she usually  takes.  Needless to say, she has felt very washed out, weak, and  fatigued.  She has continued to take her usual medications.  She states  that she had been put on Reglan in the past for her gastroparesis, but  had not taken that in quite some time.  Just recently started taking  that again within the last couple of weeks.  Question whether or not  this might be contributing to her diarrhea.  She denies any orthopnea or  PND.  She has chronic dyspnea on exertion.  Continues to ambulate with  the assistance of the cane.  She also continues to complain of chest  discomfort with regard to her GERD.   PAST MEDICAL HISTORY:  1. Congestive heart failure  secondary to nonischemic cardiomyopathy,      EF currently of 50% by echocardiogram.  Previous participant in a      COMPARE study.  2. Nonobstructive CAD status post cardiac catheterization in July of      2006.      a.     Status post stress Myoview December of 2007 showing an EF of       40% with no evidence of ischemia.  3. Previous diagnosis of breast cancer in 2006 with return of breast      cancer in March of 2008, status post right mastectomy complicated      by postoperative surgical infection.  4. Hypertension.  5. Tobacco use history.  6. Osteoarthritis.  7. History of chemotherapy in 2006.  8. Anxiety and depression.  9. Hypothyroidism.  10.Asthma.  11.Obstructive sleep apnea with CPAP use nightly.  12.Diabetes.  13.GERD/gastroparesis.  14.Irritable bowel syndrome (diarrhea).  15.Ongoing atypical chest discomfort, most likely secondary to GI      issues.  ALLERGIES:  ASPIRIN, CIPRO, SULFA, CODEINE, NAPROSYN.   REVIEW OF SYSTEMS:  As stated above.   CURRENT MEDICATIONS:  1. Levothyroxine 50 mcg daily.  2. Januvia 100 mg daily.  3. Altace 7.5 mg in the a.m. and 5 mg in the p.m.  4. Darvocet b.i.d.  5. Lipitor 20 mg daily.  6. Requip 1 mg daily.  7. Celexa 60 mg daily.  8. Coreg 12.5 mg b.i.d.  9. Buspirone 15 mg b.i.d.  10.Furosemide 60 mg in the a.m. and 40 in the evening.  11.Prevacid 40 mg b.i.d.  12.Lantus insulin daily.  13.KCl 40 mEq b.i.d.  14.Trazodone 50 mg 2 tablets nightly.  15.Proventil inhaler 2 puffs q. a.m.  16.Reglan 10 mg t.i.d.   P.R.N MEDICATIONS:  ProAir inhaler.  Darvocet.  Imodium.  Flexeril.   PHYSICAL EXAM:  Weight 227 pounds, weight is up 5 pounds from August.  Blood pressure initially recorded at 78/50.  Manual blood pressure 84/62  right arm, heart rate 96.  [name] is in no acute distress.  She is ambulating in the clinic  with a cane for assistance.  No signs of jugular venous distension at 45 degree angle.  LUNGS:   Clear to auscultation.  CARDIOVASCULAR:  Reveals an S1, S2.  Regular rate and rhythm.  ABDOMEN:  Soft and nontender.  Positive bowel sounds.  LOWER EXTREMITIES:  Without cyanosis, clubbing, or edema.  NEUROLOGIC:  Alert and oriented x3.  SKIN:  Warm and dry.   IMPRESSION:  Congestive heart failure secondary to nonischemic  cardiomyopathy.  Currently, the patient is hypotensive, but denies any  light-headedness or dizziness in the setting of multiple loose stools x3  days.  I have instructed her to hold her p.m. dose of Lasix today and  her  Altace.  To drink some extra fluids, and on the days that she has more  than 3 bowel movements, she is to hold her p.m. dose of Lasix and  followup with Dr. Katrinka Blazing as needed.      Dorian Pod, ACNP  Electronically Signed      Bevelyn Buckles. Bensimhon, MD  Electronically Signed   MB/MedQ  DD: 04/21/2007  DT: 04/21/2007  Job #: [id number]   cc:   Dr. Vira Browns

## 2010-12-18 NOTE — Assessment & Plan Note (Signed)
[facility]                          CHRONIC HEART FAILURE NOTE   [name], [name]                    MRN:          [id number]  DATE:10/20/2007                            DOB:          01/19/1956    PRIMARY CARE PHYSICIAN:  Vira Browns, M.D.   PULMONOLOGIST:  Barbaraann Share, MD,FCCP   ONCOLOGIST:  Pierce Crane, M.D.   ENDOCRINOLOGIST:  Cleophas Dunker. Everardo All, M.D.   PRIMARY CARDIOLOGIST:  Bevelyn Buckles. Bensimhon, M.D.   [name] returns today for follow up of her congestive heart failure which  is secondary to nonischemic cardiomyopathy with the EF currently 45%  status post recent cardiac catheterization, previously as low as 25%.  [name] was just in the hospital for an outpatient cardiac  catheterization, and she complained of persistent chest pain, as well as  dyspnea.  She was admitted through short stay on October 09, 2007.  Underwent cardiac catheterization by Dr. Gala Romney.  Found to have  minimal nonobstructive CAD, mild LV dysfunction with elevated filling  pressures.  Dr. Gala Romney increased her Lasix to 60 mg b.i.d. and had  her return today for follow up.  [name] states she has been feeling good  since that time until last week when she had an episode of bronchitis,  did a lot of coughing and was very sore in her chest for several days.  She states this has resolved now.  She feels back to baseline.   PAST MEDICAL HISTORY:  1. Congestive heart failure secondary to nonischemic cardiomyopathy      with EF currently 45% by cath.  Previous participant in the COMPARE      study.  2. Nonobstructive coronary artery disease status post catheterization      this month.  3. Previous diagnosis of breast cancer in 2006 with return of breast      cancer in March of 2008, status post right mastectomy complicated      by postoperative surgical infection.  4. Hypertension.  5. History of tobacco use.  6. Osteoarthritis.  7. History of chemotherapy  treatment in 2006.  8. Anxiety depression.  9. Hypothyroidism.  10.Asthma.  11.Obstructive sleep apnea with CPAP compliance.  12.Diabetes.  13.GERD.  14.Gastroparesis/irritable bowel syndrome.   REVIEW OF SYSTEMS:  As stated above; otherwise negative.   CURRENT MEDICATIONS:  1. Coreg 25 mg b.i.d.  2. Ramipril 5 mg b.i.d.  3. KCl 40 mEq b.i.d.  4. Lantus insulin as directed.  5. Levothyroxine 75 mcg daily.  6. Albuterol inhaler q.i.d.  7. Rhinocort nasal spray.  8. Imodium 4 tablets six times a day.  9. Prevacid 30 mg b.i.d.  10.Reglan 10 mg t.i.d.  11.BuSpar 30 mg b.i.d.  12.Celexa 20 mg 3 tablets daily.  13.Trazodone 50 mg 2 tablets q.h.s.  14.Lipitor 20 mg daily.  15.Darvocet b.i.d.  16.Flexeril 10 mg b.i.d.  17.CPAP q.h.s.  18.Digoxin 0.25 mg daily.  19.Requip 1 mg daily.  20.Furosemide 60 mg b.i.d.  21.Humalog insulin as directed.   CLINICAL DATA:  Most recent lab work on October 13, 2007 revealed sodium  139, potassium 3.7, glucose 154, BUN  9, creatinine 0.7.  Hemoglobin and  hematocrit of 12.2 and 36.9.   PHYSICAL EXAMINATION:  VITAL SIGNS:  Weight 219 (weight is up 1 pound).  Blood pressure 121/65 with a pulse of 94.  GENERAL:  [name] is in no acute distress.  No JVD noted.  CARDIOVASCULAR:  S1 and S2.  Regular rate and rhythm.  LUNGS:  Clear to auscultation bilaterally.  ABDOMEN:  Soft, nontender.  Positive bowel sounds.  Obese.  LOWER EXTREMITIES:  Without clubbing or cyanosis.  She has a trace of  nonpitting edema.  NEUROLOGIC:  Alert and oriented x3.   IMPRESSION:  Congestive heart failure secondary to nonischemic  cardiomyopathy with a history of chemotherapy agents in 2006.  Ejection  fraction  maintaining around 45-50%.  Status  post recent cardiac catheterization for persistent chest pain showing  nonobstructive disease.  Continue Lasix at 60 mg b.i.d.  See the patient  back in 6 weeks.      Dorian Pod, ACNP  Electronically Signed       Rollene Rotunda, MD, [facility]  Electronically Signed   MB/MedQ  DD: 10/20/2007  DT: 10/20/2007  Job #: [id number]   cc:   Vira Browns, M.D.  Pierce Crane, MD  Sean A. Everardo All, MD

## 2010-12-18 NOTE — Assessment & Plan Note (Signed)
[facility]                            CARDIOLOGY OFFICE NOTE   [name], [name]                    MRN:          [id number]  DATE:09/07/2007                            DOB:          01-Aug-1956    PRIMARY CARE PHYSICIAN:  Dr. Vira Browns.   PULMONOLOGIST:  Dr. Marcelyn Bruins.   ONCOLOGIST:  Dr. Donnie Coffin.   ENDOCRINOLOGIST:  Dr. Everardo All.   INTERVAL HISTORY:  [name] is a very pleasant 55 year old woman  with history of recurrent breast cancer now 1 year out from her full  mastectomy, obesity, hypertension, sleep apnea and congestive heart  failure secondary to nonischemic cardiomyopathy diagnosed in 2006.  Previous EF of 25% most recently has been running 45-50% region.   She is been following closely with Dorian Pod ACNP in the Heart  Failure Clinic.  She is doing relatively well although she has had  problems a bit by tachycardia.  Marcelino Duster adjusted her medications at her  last visit, increasing her beta-blocker and cutting back on her ACE  inhibitor.  She returns today for routine follow-up.  She says she doing  pretty well.  Volume status has been well controlled.  No orthopnea or  PND.  She does have chronic dyspnea and fatigue.  This is unchanged.  She notes that over the past couple months she has had two episodes of  chest pressure while at rest.  She takes a nitroglycerin and gets  better.  There is no exertional component.  This has not been getting  worse. She feels is related to stress.   CURRENT MEDICATIONS:  Coreg 25 b.i.d., ramipril 5 b.i.d., Lasix 60 in  the morning and 40 at night, potassium 40 mEq b.i.d., insulin, Synthroid  75 mcg a day, albuterol, buspirone 30 mg b.i.d., citalopram 60 a day,  Lipitor 20 a day, trazodone 100 a day, and ropinirole 1 mg a day.   PHYSICAL EXAM:  She is no acute distress.  Ambulates around clinic  without respiratory difficulty.  Blood pressure is 84/50, heart rate is 90, weight 218  which is down 7  pounds.  HEENT is normal.  Neck is supple.  There is no JVD.  Carotid 2+ bilateral bruits.  There  is no lymphadenopathy or thyromegaly.  CARDIAC:  PMI is not palpable.  She is regular rate and rhythm.  No  murmurs, rubs or gallops.  LUNGS:  Clear.  ABDOMEN:  Obese, nontender, nondistended.  There is no  hepatosplenomegaly no bruits, no masses.  EXTREMITIES:  Warm with no cyanosis, clubbing or edema.  No rash.  NEURO:  Alert and oriented x3.  Cranial nerves II-XII are intact.  Moves  all fours extremities without difficulty.   Blood work shows creatinine 0.5.  BMP of 134.  TSH is normal.   ASSESSMENT/PLAN:  1. Congestive heart failure secondary to nonischemic cardiomyopathy.      Her EF is improved markedly.  She is euvolemic.  Blood pressure is      a bit low but she is asymptomatic with this and kidney function is      stable.  Thus, I would continue.  If he does start developing      symptoms of hypotension would considered cutting back on her      nighttime Lasix first.  She will follow up in the heart failure      clinic on a routine basis.  2. Chest pain.  This has both typical and atypical features.  I went      back in with her catheterization from July 2006 and there was      minimal luminal irregularities.  She had a Myoview test in August      of last year which did not show any ischemia.  Had a discussion      about possibility of cardiac catheterization but I do not feel      strongly about this.  She currently thinks her chest pain is mostly      due to stress.  I told her she had her chest pain get more frequent      or worse in severity, then we would need to revisit the issue.  She      will keep in close touch with me.  3. Hyperlipidemia.  As per her primary care physician.   DISPOSITION:  Follow-up in the heart failure clinic.  She will come back  and see me in 6 months.     Bevelyn Buckles. Bensimhon, MD  Electronically Signed    DRB/MedQ  DD:  09/07/2007  DT: 09/07/2007  Job #: [id number]   cc:   Dr. York Ram, MD,FCCP  Pierce Crane, MD  Cleophas Dunker. Everardo All, MD

## 2010-12-18 NOTE — Assessment & Plan Note (Signed)
[facility]                          CHRONIC HEART FAILURE NOTE   Joanne, [name]                    MRN:          [id number]  DATE:07/26/2008                            DOB:          [birth year]    PRIMARY CARE PHYSICIAN:  Caffie Damme, MD   ONCOLOGIST:  Pierce Crane, MD   CARDIOLOGIST:  Bevelyn Buckles. Bensimhon, MD   [name] returns today for further followup of her congestive heart  failure, which is secondary to nonischemic cardiomyopathy with EF  currently 45% previously as low as 25%.  I last saw [name] back in June  2009.  She has followed up with Dr. Gala Romney since that time.  Continues to do quite well, maintaining [place] Heart Association class  II symptoms.  States she has a scheduled mammogram later today, but has  not had any problems in regards to her history of breast cancer.  Her  primary complaint is of ongoing elevated glucose levels.  She states she  has an appointment with Dr. Everardo All August 30, 2008, for reevaluation.  She is complaining of symptoms suggestive of neuropathy in her hands and  feet.   PAST MEDICAL HISTORY:  1. Congestive heart failure secondary to nonischemic cardiomyopathy      with EF currently 45%.  2. Nonobstructive CAD confirmed by cath.  3. History of breast cancer in 2006 with return in 2008, status post      right mastectomy.  4. Hypertension.  5. History of tobacco use.  6. Osteoarthritis.  7. Chemotherapy treatment in 2006.  8. Anxiety and depression followed by a psychiatrist.  9. Hypothyroidism.  10.Asthma.  11.Obstructive sleep apnea with CPAP compliance.  12.Diabetes followed by Dr. Romero Belling.  13.GERD.  14.Gastroparesis with irritable bowel syndrome.   REVIEW OF SYSTEMS:  As stated above.   CURRENT MEDICATIONS:  1. Flexeril 10 mg b.i.d.  2. CPAP nightly.  3. Digoxin 0.125 daily.  4. ReQuip 1 mg daily.  5. KCl 20 mEq 2 tablets b.i.d.  6. Carvedilol 37.5 mg b.i.d.  7.  Furosemide 60 mg b.i.d.  8. Lantus insulin 16 mg subcu b.i.d.  9. Humalog 30 units in the a.m. and 90 units in the p.m. per patient.  10.Ramipril 5 mg b.i.d.  11.Levothyroxine 75 mcg daily.  12.Albuterol 2 puffs q.i.d.  13.Rhinocort nasal spray 2 sprays daily.  14.Imodium A-D 2 mg 4 tablets 6 times a day.  15.Prevacid 30 mg b.i.d.  16.Reglan 10 mg t.i.d.  17.BuSpar 30 mg b.i.d.  18.Citalopram 20 mg 3 tablets daily.  19.Trazodone 50 mg 2 tablets daily.  20.Lipitor 20 mg daily.   P.r.n. includes nitroglycerin and Darvocet.   PHYSICAL EXAMINATION:  VITAL SIGNS:  Weight 224 pounds, weight is up 6  pounds; blood pressure 101/62 with a heart rate of 92.  GENERAL:  [name] is in no acute distress.  NECK:  No signs of jugular vein distention at 45 degrees angle.  LUNGS:  Clear to auscultation bilaterally.  CARDIOVASCULAR:  S1 and S2.  Regular rate and rhythm.  ABDOMEN:  Obese, soft, nontender, positive bowel sounds.  EXTREMITIES:  Lower  extremities without clubbing, cyanosis, or edema.  NEUROLOGIC:  Alert and oriented x3.   EKG showing sinus rhythm with a incomplete right bundle-branch block.  No change from previous EKGs.  Her heart rate 93.   IMPRESSION:  Congestive heart failure secondary to nonischemic  cardiomyopathy without signs of volume overload at this time, [place]  Heart Association class II.  Continue current therapy and check lab work  today.      Dorian Pod, ACNP  Electronically Signed      Bevelyn Buckles. Bensimhon, MD  Electronically Signed   MB/MedQ  DD: 07/26/2008  DT: 07/26/2008  Job #: [id number]   cc:   Caffie Damme, MD

## 2010-12-18 NOTE — Assessment & Plan Note (Signed)
[facility]                          CHRONIC HEART FAILURE NOTE   [name], [name]                    MRN:          [id number]  DATE:01/11/2008                            DOB:          [birth year]    PRIMARY CARE PHYSICIAN:  Bobby Rumpf, MD.   ONCOLOGIST:  Pierce Crane, MD.   PRIMARY CARDIOLOGIST:  Bevelyn Buckles. Bensimhon, MD.   HISTORY:  [name] returns today for followup of her congestive heart  failure which is secondary to nonischemic cardiomyopathy with an EF  currently 45%, EF previously as low as 25%.  [name] has been doing quite  well since I last saw her.  She denies any symptoms suggestive of volume  overload as long as she is compliant with her diuretic regimen.  She is  concerned today her CBGs are running higher than normal for her with a  CBG of 279 this morning.  She is going to call Dr. Everardo All and schedule  a followup appointment.  She has also had some migraine headaches  recently relieved with her prescription medication.  She complains of  decreased energy level at times, but no more so than usual for her.   PAST MEDICAL HISTORY:  1. Congestive heart failure secondary to nonischemic cardiomyopathy      with an EF currently 45%, previously 25%.  2. Nonobstructive CAD by cath.  3. Breast cancer in 2006, with return of breast cancer in 2008, status      post right mastectomy.  4. Hypertension.  5. History of tobacco use.  6. Osteoarthritis.  7. History of chemotherapy in 2006.  8. Anxiety/depression.  9. Hypothyroidism.  10.Asthma.  11.Obstructive sleep apnea with CPAP compliance.  12.Diabetes.  13.GERD.  14.Gastroparesis with irritable bowel syndrome.   REVIEW OF SYSTEMS:  As stated above, otherwise negative.   CURRENT MEDICATIONS:  1. Coreg 25 mg b.i.d.  2. Ramipril 5 mg b.i.d.  3. Lantus 120 units nightly.  4. Levothyroxine 75 mcg daily.  5. Albuterol daily.  6. Rhinocort daily.  7. Imodium p.r.n.  8. Prevacid 30  mg b.i.d.  9. Reglan 10 mg t.i.d.  10.BuSpar 30 mg b.i.d.  11.Celexa 20 mg 3 tablets daily.  12.Trazodone 50 mg 2 tablets daily.  13.Lipitor 20 mg daily.  14.Darvocet p.r.n.  15.Flexeril p.r.n.  16.CPAP nightly.  17.Digoxin 0.25 mg daily.  18.ReQuip 1 mg daily.  19.Furosemide 60 mg b.i.d.  20.Humalog 50 units with evening meal.  21.KCl 20 mEq 2 tablets t.i.d.   PHYSICAL EXAMINATION:  Weight 221 pounds; blood pressure 132/63 with a  heart rate of 121, repeat heart rate 112 at rest; 98% on room air.  Scherrie is in no acute distress.  No signs of jugular vein distention at  45-degree angle.  LUNGS:  Clear to auscultation bilaterally.  CARDIOVASCULAR:  Reveals S1 and S2, tachycardic.  ABDOMEN:  Soft and nontender.  Positive bowel sounds.  LOWER EXTREMITIES:  Without clubbing, cyanosis, or edema.  NEUROLOGICAL:  Alert and oriented x3.   IMPRESSION:  Congestive heart failure without signs of volume overload.  Darian continues to remain tachycardic.  I am  going to titrate her Coreg  up to 37.5 mg at bedtime and have her follow up with Dr. Gala Romney for  routine cardiology visit.  [name] will make an appointment with Dr.  Everardo All regarding her elevated CBGs.  She is also due for blood work  today, but will have blood work drawn at Dr. George Hugh office per her  report, so we will obtain a copy of the blood work done there.     Dorian Pod, ACNP  Electronically Signed      Bevelyn Buckles. Bensimhon, MD  Electronically Signed   MB/MedQ  DD: 01/11/2008  DT: 01/12/2008  Job #: [id number]   cc:   Pierce Crane, MD  Cleophas Dunker. Everardo All, MD  Bobby Rumpf, MD

## 2010-12-18 NOTE — Assessment & Plan Note (Signed)
[facility]                            CARDIOLOGY OFFICE NOTE   JUSTIS, DUPAS                    MRN:          [id number]  DATE:12/22/2006                            DOB:          08/16/1955    INTERVAL HISTORY:  [name] is a delightful 55 year old woman with  multiple medical problems including recurrent breast cancer,  hypertension, obesity, diabetes and sleep apnea.  She also has a history  of congestive heart failure with nonischemic cardiomyopathy with an  ejection fraction of 20% in 2006 but most recently an EF of 47% by  Myoview in December 2007.   Since I last saw her she was found to have recurrent breast cancer in  her right breast and underwent total mastectomy with lymph node  dissection.  This was complicated by a slow-healing wound site which  took over 3 months to heal.  She thus was not treated with any further  chemotherapy.  She says for several months she was not taking her  medications and she developed worsening heart failure with a significant  volume overload with dyspnea.  Over the past week or two she has  restarted her medications and now feels much better with resolution of  her symptoms.   CURRENT MEDICATIONS:  1. Levothyroxine 50 mcg a day.  2. Januvia 100 mg a day.  3. Prilosec 40 mg a day.  4. Potassium 40 mEq t.i.d.  5. BuSpar 15 mg b.i.d.  6. Lasix 40 mg b.i.d.  7. Altace 5 mg a day.  8. Proventil inhaler.  9. Lipitor 20 mg a day.  10.Requip.  11.Celexa 60 mg a day.  12.Trazodone 50 mg q.h.s.  13.Coreg 12.5 mg b.i.d.   PHYSICAL EXAMINATION:  GENERAL:  She is mildly flushed-appearing but in  no acute distress.  Ambulates around the clinic without any respiratory  difficulty.  VITAL SIGNS:  Blood pressure is 110/70, heart rate is 111, weight is  220.  HEENT:  Normal except for facial flushing.  NECK:  Supple.  No evidence of JVD.  Carotids are 2+ bilaterally without  bruits.  There is no  lymphadenopathy or thyromegaly.  CARDIAC:  She is tachycardic and regular.  PMI is nondisplaced.  There  is no murmur, rub, or gallop.  LUNGS:  Clear.  ABDOMEN:  Obese, nontender.  No evidence of hepatosplenomegaly.  No  bruits, no masses appreciated.  Good bowel sounds.  She is nontender.  EXTREMITIES:  Warm with no cyanosis, clubbing or edema, no rash.  Distal  pulses are good.  NEUROLOGIC:  Alert and oriented x3.  Cranial nerves II-XII are intact.  Moves all four extremities without difficulty.  Affect is pleasant.   EKG shows sinus tachycardia at a rate of 111 with no significant ST-T  wave abnormality.  There is mild QT prolongation.   ASSESSMENT AND PLAN:  Congestive heart failure secondary to nonischemic  cardiomyopathy.  Currently I would put her NYHA Class II-III.  She does  not have any evidence of volume overload at this point; however, her  tachycardia concerns me for a possible worsening of her  stroke volume  and left ventricular function, especially in light of her being  noncompliant with some of her heart failure medications.  At this point  we will get a set of baseline labs, check a CBC, a BMP as well as a TSH,  and we will order a repeat echocardiogram.  I told her that it is  critical that she be compliant with all her heart failure medications.  We will see her back in the near future for close follow-up.     Bevelyn Buckles. Bensimhon, MD  Electronically Signed    DRB/MedQ  DD: 12/22/2006  DT: 12/23/2006  Job #: [id number]   cc:   Joni Fears D. Maple Hudson, MD, FCCP, FACP  Pierce Crane, M.D.

## 2010-12-18 NOTE — Assessment & Plan Note (Signed)
[facility]                          CHRONIC HEART FAILURE NOTE   Joanne, [name]                    MRN:          [id number]  DATE:03/11/2007                            DOB:          [birth year]    [name] returns today for follow-up.  I saw [name] on July 31 for  her routine heart failure follow-up, at which time she was complaining  of some chest discomfort.  Also found her to be mildly volume overloaded  and increased her Lasix to 60 in the morning and 40 in the evening.  Blood work was obtained.  Blood work results from that visit:  3.8  potassium, BUN and creatinine 10 and 0.6, BNP 104.  I scheduled [name]  for the stress Cardiolite which she underwent today.  [name] continues  to complain of some chest discomfort, states she has been having more  GERD-type symptoms.  Chest discomfort seems to be relieved with  belching.  Otherwise she states she feels good.  She states compliance  with her medications, has not missed any doses of her Lasix.   PAST MEDICAL HISTORY:  1. Congestive heart failure secondary to nonischemic cardiomyopathy      with EF previously 20% and currently 45-50% by echocardiogram, 2006      as part of the compare study.  2. Nonobstructive CAD, status post cardiac catheterization in July      2006.      a.     Status post stress Myoview, December 2007, showed an EF of       40% with no evidence of ischemia.  3. Previous diagnosis of breast cancer in 2006 with return of breast      cancer in March 2008, status post right mastectomy complicated by      postoperative surgical infection.  4. Hypertension.  5. History of tobacco use.  6. Osteoarthritis.  7. History of chemotherapy treatment in 2006 for breast cancer.  8. History of anxiety and depression.  9. Hypothyroidism which is managed by Dr. Vira Browns.  10.History of asthma.  11.Obstructive sleep apnea with CPAP use q.h.s.  12.Diabetes.  13.GERD, status  post recent GI workup.  14.Ongoing symptoms of chest discomfort.   ALLERGIES:  1. ASPIRIN.  2. CIPRO.  3. SULFA.  4. CODEINE.  5. NAPROSYN.   REVIEW OF SYSTEMS:  As stated above.   CURRENT MEDICATIONS:  1. Levothyroxine 50 mcg daily.  2. Januvia 100 mg daily.  3. Altace 7.5 mg daily.  4. Levemir 18 ml subcu daily.  5. Lipitor 20 mg daily.  6. Requip 1 mg q.h.s.  7. Celexa 60 mg daily.  8. Trazodone 50 mg q.h.s.  9. Coreg 12.5 mg b.i.d.  10.Altace 5 mg in the evening in addition to 7.5 mg in the morning.  11.BuSpar 15 mg b.i.d.  12.Furosemide 60 mg in the a.m., 40 in the evening.  13.Prevacid 30 mg daily.  14.KCl 40 mEq in the a.m., 20 in the evening.   PRN MEDICATIONS:  Proventil, Darvocet, Atrovent, Imodium, and Flexeril.   PHYSICAL EXAMINATION:  Weight 222.  Weight is down  two pounds.  Blood  pressure 115/73 with a heart rate of 117.  Heart rate confirmed at 117  with a pulse ox of 96% on room air.  [name] is in no acute distress.  NECK:  She has no jugular vein distention at 45 degree angle.  LUNGS:  Clear to auscultation.  CARDIOVASCULAR:  Exam reveals S1/S2, tachycardic.  ABDOMEN:  Soft, nontender.  Obese.  Positive bowel sounds.  EXTREMITIES:  Lower extremities with clubbing, cyanosis, or edema at  this time.  SKIN:  Warm and dry.  NEUROLOGIC:  Alert and oriented.   IMPRESSION:  1. Congestive heart failure secondary to nonischemic cardiomyopathy,      status post stress Myoview today, showing an EF of 46-50%.  Patient      with atypical chest discomfort, status post stress Myoview showing      no ischemia per preliminary report interpreted by Dr. Nicholes Mango.  Will continue diuretic at current dose.  2. Tachycardia.  Not sure of etiology, possibly secondary to extreme      deconditioning.  I am going to increase [name]'s Coreg to 18.375 mg      b.i.d. and check lab work on her today, including a TSH.  Will fax      copy of blood work to Dr. Vira Browns.     Dorian Pod, ACNP  Electronically Signed      Bevelyn Buckles. Bensimhon, MD  Electronically Signed   MB/MedQ  DD: 03/11/2007  DT: 03/11/2007  Job #: [id number]

## 2010-12-18 NOTE — Cardiovascular Report (Signed)
NAMEARACELLY, [name]             ACCOUNT NO.:  [id number]   MEDICAL RECORD NO.:  [id number]          PATIENT TYPE:  OIB   LOCATION:  1962                         FACILITY:  [facility]   PHYSICIAN:  Bevelyn Buckles. Bensimhon, MDDATE OF BIRTH:  1956-08-04   DATE OF PROCEDURE:  10/09/2007  DATE OF DISCHARGE:  10/09/2007                            CARDIAC CATHETERIZATION   PRIMARY CARE PHYSICIAN:  Dr. Crista Luria.   PULMONOLOGIST:  Dr. Marcelyn Bruins.   ONCOLOGIST:  Dr. Pierce Crane.   ENDOCRINOLOGIST:  Dr. Romero Belling   IDENTIFICATION:  [name] is a very pleasant 55 year old woman with  a history of recurrent breast cancer as well as obesity, hypertension  and sleep apnea.  She has been followed in the heart failure clinic for  congestive heart failure secondary nonischemic cardiomyopathy.  Previous  EF of 25%.  More recently, it has been running in the 45-50% range.  She  has been complaining of persistent chest pain as well as dyspnea.  She  is brought for right and left heart catheterization.   PROCEDURES PERFORMED:  1. Right heart cath.  2. Left heart cath.  3. Selective coronary angiography.  4. Left ventriculogram.   DESCRIPTION OF PROCEDURE:  The risks and indication of catheterization  were explained.  Consent was signed and placed on the chart.  A 4-French  arterial sheath was placed in the right femoral artery.  Standard  catheters for the JL-4, 3-DRC and angled pigtail were used for the  catheterization.  All catheter exchanges made over a wire.  No apparent  complications.  The 7-French venous sheath was placed in the right  femoral vein.  A standard right heart catheterization was done with Theone Murdoch catheter.  No apparent complications.  Right atrial pressure mean  of 16, RV pressure 51/12 with RVEDP of 19.  PA pressure 49/26 with a  mean of 39.  Pulmonary capillary wedge pressure was a mean of 28 with V-  waves to 36.  Central aortic pressure was 107/66 with mean of  83.  LV  pressure was 115/15 with an EDP of 31.  Pulmonary vascular resistance  was 2.1 Woods units.  Fick cardiac output was 5.3 liters per minute.  Cardiac index was 2.6 liters per minute per meter squared.   Left main was normal.   LAD was a long vessel coursing to the apex, gave off two diagonals.  There was a mild irregularity in the proximal portion of the LAD  otherwise angiographically normal.   Left circumflex was a moderate-sized system.  It gave off a tiny OM-1, a  moderate-sized OM-2, small OM-III and moderate-sized posterolateral.  A  30% lesion in the proximal left circumflex and mild luminal  irregularities distally.  No high-grade stenosis.   Right coronary artery is a moderate-sized dominant vessel gave off an RV  branch and a PDA.  There was just diffuse mild luminal irregularities.  This was minimal.  No high-grade lesions.   Left ventriculogram done in the RAO position showed an EF of 45% with  global hypokinesis.   ASSESSMENT:  1. Minimal nonobstructive coronary  artery disease.  2. Mild LV dysfunction.  3. Elevated filling pressures.   PLAN/DISCUSSION:  Arteries look quite good.  I suspect her chest  pressure may be due to elevated EDP or perhaps noncardiac.  Will go  ahead and increase her Lasix to 60 mg b.i.d. and we will check B-met  next week.  She will follow back up at the heart failure clinic within 2  weeks.      Bevelyn Buckles. Bensimhon, MD  Electronically Signed     DRB/MEDQ  D:  10/09/2007  T:  10/10/2007  Job:  [id number]   cc:   Dr. Crista Luria  Barbaraann Share, MD,FCCP  Pierce Crane, MD  Cleophas Dunker. Everardo All, MD

## 2010-12-18 NOTE — Assessment & Plan Note (Signed)
[facility]                          CHRONIC HEART FAILURE NOTE   [name], [name]                    MRN:          [id number]  DATE:06/22/2007                            DOB:          1956/07/30    PRIMARY CARE PHYSICIAN:  Dr. Vira Browns.   PHYSICIANS:  Pulmonology, Dr. Marcelyn Bruins; primary cardiologist Dr. Nicholes Mango; oncology Dr. Pierce Crane.   [name] returns today for followup of her congestive heart failure which  is secondary to nonischemic cardiomyopathy with EF previously 20% most  recently improved to 45 to 50% by echocardiogram.  [name] has had a  difficult time.  I last saw her in September at which time she had been  stable.  Her weight was up 5 pounds in the setting of diarrhea, and I  had not adjusted her diuretic as she did not show signs of volume  overload at that time.  However, she states a few days after that she  presented to [facility] for increased fatigue and shortness of  breath.  She states she was told at [facility] that she had some  fluid around her heart.  This is per patient's report.  I do not have  any records from [facility] to confirm this.  She states she was given  some extra Lasix and was discharged home a couple of days later.  In  further discussion with her, however, she apparently had been missing  some doses of her Lasix though she states when she knows she has an  appointment she does not take her Lasix as prescribed, but states she  has learned her lesson since then.  She also states she had an MRA done  at [facility] that showed a questionable spot on her left lung.  She is pending a PET scan for further evaluation.  She also complains of  a cold last week.  Had some low grade temperature and some chills.  Her  blood sugar she reports has been running 220's her average CBG's.   PAST MEDICAL HISTORY:  1. Congestive heart failure secondary to nonischemic cardiomyopathy  with EF currently 50% by echocardiogram.  Previous participant in      the COMPARE study.  2. Nonobstructive CAD status post catheterization in July 2006.      a.     Status post stress Myoview December 2007 showing EF 40% with       no evidence of ischemia.  3. Previous diagnosis of breast cancer in 2006 with return of breast      cancer in March 2008.      a.     Status post right mastectomy complicated by postoperative       surgical infection at that time.  4. Hypertension.  5. Tobacco use history.  6. Osteoarthritis.  7. History of chemotherapy treatment in 2006.  8. Anxiety/depression.  9. Hypothyroidism.  10.Asthma.  11.Obstructive sleep apnea with CPAP compliance.  12.Diabetes.  13.GERD, gastroparesis, irritable bowel syndrome.  14.Ongoing atypical chest discomfort.  15.Recent MRI of questionable left lung nodule pending further  evaluation.   ALLERGIES:  ASPIRIN, CIPRO, SULFA, CODEINE, NAPROSYN.   CURRENT MEDICATIONS:  1. Levothyroxine 75 mcg daily.  2. Januvia 100.  3. Altace 7.5.  4. Darvocet b.i.d. p.r.n.  5. Lipitor 20.  6. Requip 1 mg q.h.s.  7. Celexa 60 mg daily.  8. Coreg 18.375 mg b.i.d.  9. Altace 2.5 mg in the morning, 5 mg in the evening.  10.Buspirone 30 mg b.i.d.   PHYSICAL EXAMINATION:  VITAL SIGNS:  Weight 220 pounds, weight is down 7  pounds from September, blood pressure 112/71 with pulse 102.  GENERAL:  In no acute distress.  NECK:  No jugular vein distention at 45 degree angles.  LUNGS:  Some scattered rhonchi in bilateral upper lobes; otherwise,  clear to auscultation.  CARDIOVASCULAR:  Reveals S1, S2, tachycardic.  ABDOMEN:  Soft, nontender, obese, positive bowel sounds.  EXTREMITIES:  Lower extremities without clubbing, cyanosis or edema.  NEUROLOGICAL:  Alert and oriented x3, ambulating with assistance of a  cane.  SKIN:  Warm and dry.   IMPRESSION:  Congestive heart failure secondary to nonischemic  cardiomyopathy without signs  of volume overload.  The patient is mildly  tachycardic today.  In reviewing past note she has been on several  occasions.  I am going to start her on digoxin 0.125 mg daily.  See her  back at beginning of the year.  In the meantime she will have further  workup of questionable mass in the left lung.      Dorian Pod, ACNP  Electronically Signed      Rollene Rotunda, MD, [facility]  Electronically Signed   MB/MedQ  DD: 06/22/2007  DT: 06/23/2007  Job #: [id number]   cc:   Vira Browns, M.D.  Barbaraann Share, MD,FCCP  Bevelyn Buckles. Bensimhon, MD  Pierce Crane, MD

## 2010-12-21 NOTE — Procedures (Signed)
[name], [name] NO.:  [id number]   MEDICAL RECORD NO.:  [id number]          PATIENT TYPE:  OUT   LOCATION:  SLEEP CENTER                 FACILITY[facility]  [facility]   PHYSICIAN:  Marcelyn Bruins, M.D. [facility] DATE OF BIRTH:  1956/03/13   DATE OF STUDY:  07/10/2005                              NOCTURNAL POLYSOMNOGRAM   REFERRING PHYSICIAN:  Marcelyn Bruins, M.D. [facility].   DATE OF STUDY:  July 10, 2005.   INDICATION FOR STUDY:  Hypersomnia with sleep apnea.   EPWORTH SLEEPINESS SCORE:  13.   SLEEP ARCHITECTURE:  The patient had a total sleep time of 320 minutes with  very little slow wave sleep and no REM. Sleep onset latency was normal.  Sleep efficiency was decreased at 84%.   RESPIRATORY DATA:  The patient was found to have 220 hypopneas and 124  apneas for a respiratory disturbance index of 65 events per hour. The events  were not positional, but loud snoring was noted.   OXYGEN DATA:  The patient had O2 desaturation as low as 78% with her  obstructive events.   CARDIAC DATA:  No clinically significant cardiac arrhythmias.   MOVEMENT/PARASOMNIAS:  The patient was found to have 152 leg jerks with  almost 3 per hour resulting in arousal or awakening.   IMPRESSION/RECOMMENDATIONS:  1.  Severe obstructive sleep apnea with a respiratory disturbance index of      65 events per hour and O2 desaturation as low as 78%. Treatment for this      degree of sleep apnea should focus primarily on weight loss if      applicable as well as C-PAP. Consideration can be given to other      therapies if the patient fails this.  2.  Large numbers of leg jerks with significant sleep disruption. It is      unclear how much of this is due to the patient's sleep apnea and how      much may be due to a movement disorder. Clinical correlation is      suggested.           ______________________________  Marcelyn Bruins, M.D. [facility]  Diplomate, American Board of Sleep  Medicine    KC/MEDQ  D:   07/24/2005 11:09:46  T:  07/24/2005 22:24:46  Job:  [id number]

## 2010-12-21 NOTE — Letter (Signed)
July 30, 2006    [employer]  [address]  [place], [place] [address]   RE:  [name], [name]  MRN:  [id number]  /  DOB:  04/21/1956   To whom it may concern:   I have recently been informed that [name] is in jeopardy of losing  her essential service line coverage.  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  If  you should have any questions regarding this, please do not hesitate to  contact me.    Sincerely,      Bevelyn Buckles. Bensimhon, MD  Electronically Signed    DRB/MedQ  DD: 07/30/2006  DT: 07/30/2006  Job #: [id number]

## 2010-12-21 NOTE — H&P (Signed)
[name], [name] NO.:  [id number]   MEDICAL RECORD NO.:  [id number]          PATIENT TYPE:  EMS   LOCATION:  MAJO                         FACILITY:  [facility]   PHYSICIAN:  Michaelyn Barter, M.D. DATE OF BIRTH:  October 17, 1955   DATE OF ADMISSION:  09/25/2005  DATE OF DISCHARGE:                                HISTORY & PHYSICAL   PRIMARY CARE PHYSICIAN:  Vira Browns, M.D., [facility] in [place].   CARDIOLOGIST:  Dr. Gala Romney of [facility].   PULMONOLOGIST:  Dr. Shelle Iron of [facility].   ONCOLOGIST:  Dr. Donnie Coffin.   RADIATION ONCOLOGIST:  Dr. Darleene Cleaver.   CHIEF COMPLAINT:  Shortness of breath and chest pain.   HISTORY OF PRESENT ILLNESS:  [name] is a 55 year old female with a  past medical history of breast cancer now in remission, sleep apnea,  congestive heart failure, who states that she developed shortness of breath  approximately two weeks ago.  She initially attributed the shortness of  breath to her CHF.  She states that over the past two weeks, her shortness  of breath has progressed to the point that talking, walking, and  occasionally whenever she lies down her shortness of breath becomes worse.  She states that occasionally she experiences centrally located chest pain.  It is not constant.  She complains of some nausea but no emesis, no fevers  or chills.  Positive headache.  No recent travels, no long flights or car  rides.   PAST MEDICAL HISTORY:  1.  Right-sided  breast cancer currently in remission, initially diagnosed      September 2005.  The patient had a lumpectomy followed by chemotherapy      consisting of seven treatments, the last one being in April 2006.  This      was followed by radiation of which the patient underwent 35 treatments,      the last treatment given February 21, 2005.  2.  Irritable bowel syndrome.  3.  Arthritis.  4.  L4 herniated disk.  5.  Depression.  6.  Hypothyroidism.  7.  Bulging disk in the  upper back.  8.  Sleep apnea.  The patient follows with Dr. Shelle Iron.  9.  Hypertension.  10. CHF/dilated cardiomyopathy.   PAST SURGICAL HISTORY:  1.  Laparoscopic cholecystectomy which was done May 29, 2001 by Dr.      Lovie Chol.  2.  Bilateral needle localization breast biopsy completed June 01, 2001      by Dr. Lovie Chol.  3.  Lumpectomy of the right breast.   ALLERGIES:  1.  Aspirin.  2.  Ciprofloxacin.  3.  Sulfa.  4.  NSAIDS.  5.  Decadron causes skin to turn red.   HOME MEDICATIONS:  1.  Nexium 40 mg p.o. daily.  2.  Celexa 40 mg daily.  3.  Digoxin 250 mcg daily.  4.  Furosemide 80 mg b.i.d.  5.  Potassium chloride 40 mEq b.i.d.  6.  Altace 2.5 mg b.i.d.  7.  Tylenol PM one tablet daily.  8.  Flexeril 10 mg p.r.n.  9.  Zofran 8 mg p.r.n.  10. Prochlorperazine 10 mg p.r.n.  11. Imodium p.r.n.  12. Levothyroxine 50 mcg daily.  13. Propoxyphene-APAP 65/650 p.r.n.   SOCIAL HISTORY:  1.  Cigarettes:  The patient stopped smoking cigarettes four years ago.  She      started smoking at the age of [age].  She smoked up to 2.5 packs per day.  2.  Alcohol:  The patient denies.   FAMILY HISTORY:  Father had a history of heart problems and diabetes  mellitus.  Mother had a history of lung cancer, blood clots, diabetes mellitus which  was diet controlled.   REVIEW OF SYSTEMS:  As per HPI.  Otherwise, all other systems are negative.   PHYSICAL EXAMINATION:  The patient is awake, she is cooperative.  She  appears to be slightly short of breath especially when talking.  However,  she does talk in full sentences.  There is no obvious use of accessory  muscles to aid her breathing.  VITALS:  Temperature 97.0, blood pressure 130/69, heart rate 113,  respirations 24, O2 97% on room air.  HEENT:  Normocephalic, atraumatic.  Anicteric.  Pupils are equally reactive  to light.  Extraocular movements are intact.  Oral mucosa is pink.  No  thrush or  exudates.  NECK:  No JVD.  No lymphadenopathy.  No thyromegaly appreciated.  CARDIAC:  S1 and S2 is present.  Regular rate and rhythm.  No S3 or S4.  No  murmurs, no gallops, no rubs.  RESPIRATORY:  Clear.  ABDOMEN:  Soft.  Some slight left upper quadrant tenderness.  No rebound.  No guarding.  Positive bowel sounds.  EXTREMITIES:  No distal leg edema.  NEUROLOGIC:  The patient is alert and oriented x3.  MUSCULOSKELETAL:  5/5 upper and lower extremity strength.   White blood cell count 7.9, hemoglobin 12.5, hematocrit 36.6, platelets 284.  Sodium 135, potassium 3.8, chloride 104, CO2 25, BUN 10, creatinine 0.8,  glucose 190.  Bilirubin total 0.5, alkaline phosphatase 62.  Myoglobin, POC  56.6.  CK MB POC 1.5. Troponin I POC less than 0.05.  Calcium 9.0.  Total  protein 6.3, albumin 3.2, SGOT 31, SGPT 29.  Chest CT:  Evidence of  pulmonary emboli is present.  Cardiomegaly, probable mild interstitial  edema. Mild COPD.  Stable 9 mm left lower lobe nodule.   ASSESSMENT/PLAN:  [name] is a 55 year old female here for evaluation  of progressive shortness of breath over the past two weeks, diagnosed with  pulmonary emboli by CT scan.   Problem 1. Pulmonary embolism.  Will start heparin IV followed by Coumadin.  Will provide oxygen and p.r.n. medication for pain.  Will also check the  patient's coags in the morning.  Problem 2. History of hypertension.  The patient's blood pressure currently  appears to be stable.  Will resume her previously prescribed medications.  Problem 3. History of depression.  This currently appears to be stable. We  will resume the patient's previously prescribed home medications.  Problem 4. History of hypothyroidism.  This also currently appears to be  stable.  Will resume the patient's previously prescribed levothyroxine.  Problem 5. History of CHF.  Will monitor the patient's Is and Os very closely.  Will perform daily weights and will resume home dose of  Lasix.  Problem 6. History of breast cancer, now in remission.  Will monitor.  Problem 7.  History of sleep apnea.  Will provide CPAP.  Problem 8. GI prophylaxis.  Will provide Protonix.      Michaelyn Barter, M.D.  Electronically Signed     OR/MEDQ  D:  09/26/2005  T:  09/26/2005  Job:  [id number]   cc:   Vira Browns, M.D.  [facility]  [place], [facility]   Arvilla Meres, M.D. [facility]  [facility]  [address]  [place]  [place] [address]   Marcelyn Bruins, M.D. [facility]  [address]  [place]  [place] [address]   Pierce Crane, M.D.  Fax: [phone]

## 2010-12-21 NOTE — H&P (Signed)
[name], [place] NO.:  [id number]   MEDICAL RECORD NO.:  [id number]          PATIENT TYPE:  EMS   LOCATION:  MAJO                         FACILITY:  [facility]   PHYSICIAN:  Audery Amel, MD    DATE OF BIRTH:  October 29, 1955   DATE OF ADMISSION:  07/05/2006  DATE OF DISCHARGE:                              HISTORY & PHYSICAL   PRIMARY CARDIOLOGIST:  Bevelyn Buckles. Bensimhon, M.D.   CHIEF COMPLAINT:  Chest pain.   HISTORY OF PRESENT ILLNESS:  The patient is a 55 year old white female  with a past medical history notable to non-ischemic cardiomyopathy,  presumed secondary to chemotherapy, who presents for further evaluation  of chest pain.  The patient states that she was having chest pain  earlier in the week on Tuesday, which was paroxysmal in nature.  This  has persisted throughout the week, and today, at approximately 5 p.m.,  the pain became significantly more severe, rated at 7/10.  She  characterizes it as sharp chest pain which radiates to the base of her  left neck.  There was no associated nausea, vomiting, diaphoresis,  shortness of breath or palpitations.  With regards to her heart failure,  she denies orthopnea or PND.  She has severe obstructive sleep apnea,  for which she uses CPAP therapy, but is unclear of her settings.  She  states that this episode is very similar to that which she experienced  earlier this year, at which time she was determined to have non-cardiac  chest pain.  In the emergency department, her cardiac markers were  negative x1.  Her EKG revealed normal sinus rhythm with light transition  in the precordial leads.  Otherwise, there is no evidence of acute  injury or ischemia.  Since arriving, her chest pain has now subsided,  and she is currently asymptomatic and otherwise without complaints.   PAST MEDICAL HISTORY:  1. Non-ischemic cardiomyopathy, status post heart catheterization in      2006 which revealed non-obstructive coronary  disease and an EF of      20%.  2. Hypertension.  3. Obstructive sleep apnea requiring CPAP therapy.  4. Breast CA, status post mastectomy, chemotherapy and XRT.  5. Diabetes.  6. GERD.  7. Hypothyroidism.  8. Anxiety disorder.   ALLERGIES:  1. ASPIRIN.  2. CIPRO.  3. NSAIDS.  4. SULFA.   MEDICATIONS:  1. Altace 10 mg q.a.m. and 5 mg q.p.m.  2. Coreg 12.5 mg b.i.d.  3. Celexa 60 mg q.a.m.  4. Lasix 80 mg q.a.m., 40 mg q.p.m.  5. Flexeril 10 mg q.h.s.  6. Potassium 40 mEq q.a.m., 20 mEq q.p.m.  7. Januvia 100 mg q.a.m.  8. Albuterol M.D.I. two puffs q.4h. as needed.  9. Lipitor 20 mg daily.  10.Nexium 40 mg daily.  11.Trazodone 50 mg daily.  12.Synthroid, dose unknown.   SOCIAL HISTORY:  The patient lives in [place] with her mother.  She was in school to become an R.N., however, was diagnosed with breast  cancer.  She did complete C.N.A. school.  She has a remote history of  heavy smoking,  2-1/2 packs per day for approximately 30 years.  She quit  approximately 6 years ago.  She denies any alcohol or illicit  substances.   FAMILY HISTORY:  Mother is alive and well with hypertension and  diabetes.  Her father died from a myocardial infarction at the age of  66.  She has 1 brother who was diagnosed with hypertension and status  post percutaneous coronary intervention.  One brother with hypertension.   REVIEW OF SYSTEMS:  CONSTITUTIONAL:  No fever, chills, sweats,  adenopathy.  HEENT:  No headache, sore throat, nasal discharge, change  in visual or auditory acuity.  SKIN:  No rashes or lesions.  CARDIOPULMONARY:  As per HPI.  GU:  No frequency, urgency or dysuria.  NEUROPSYCHIATRIC:  No weakness, numbness or mood disturbance.  MUSCULOSKELETAL:  No myalgias, arthralgias, joint swelling or deformity.  GI:  No nausea, vomiting, diarrhea, bright red blood per rectum, melena  or dysphagia.  The patient does have occasional symptoms consistent with  GERD.  ENDOCRINE:  No  polyuria, no polydipsia, no head or cold  intolerance.  All other review of systems are negative.   PHYSICAL EXAMINATION:  VITAL SIGNS:  Blood pressure is 122/76, heart  rate is 99.  O2 saturations are 95% on room air.  Temperature is 97.7.  GENERAL:  The patient is overweight, alert and oriented x3, in no acute  distress, pleasantly conversant.  HEENT:  Normocephalic and atraumatic.  Extraocular movements intact.  Pupils equal, round and reactive to light.  Nares are patent.  Oropharynx is clear without erythema or exudate.  NECK:  Supple.  Full range of motion.  No palpable thyromegaly.  Carotid  upstrokes are equal and symmetric bilaterally without audible bruit.  There is minimal JVD.  Lymphadenopathy - none.  CARDIOVASCULAR:  Normal S1 and S2, with a 2/6 systolic murmur at the  left sternal border that does not radiate.  LUNGS:  Bibasilar crackles, but was otherwise clear to auscultation  bilaterally.  SKIN:  No rashes or lesions.  ABDOMEN:  Obese, soft, nontender, nondistended.  Positive bowel sounds.  No hepatosplenomegaly.  GU:  Normal female genitalia.  EXTREMITIES:  Trace bilateral lower extremity edema with no rashes or  lesions.  MUSCULOSKELETAL:  No joint deformity or effusions noted.  NEUROLOGIC:  Cranial nerves II-XII grossly intact.  Strength and  sensation are grossly intact throughout.   CHEST X-RAY:  Mild pulmonary edema.  Questionable right lower lobe  infiltrate.   ELECTROCARDIOGRAM:  Normal sinus rhythm with late transition in the  precordial leads.  Otherwise, there is no evidence for acute injury or  ischemia.   DATA:  Sodium 136, potassium 3.5, chloride 103, BUN 14, glucose 114,  creatinine 0.8, hematocrit 0.3, troponin less than 0.05.  CK-MB 1.3.   IMPRESSIONS:  1. Atypical chest pain.  2. Nonischemic cardiomyopathy.  3. Hypertension.  4. Diabetes.  5. History of breast cancer status post mastectomy, chemotherapy and      XRT. 6. Obstructive sleep  apnea.  7. Gastroesophageal reflux disease.  8. Hypothyroidism.  9. Anxiety disorder.   PLAN:  From a cardiovascular standpoint, Ms. Chalk's symptoms would  seem to be atypical in nature.  She had a cardiac catheterization one  year prior to this presentation which revealed nonobstructive coronary  disease with an EF of 20%.  Her cardiomyopathy has been attributed to  chemotherapy that she received for her breast cancer diagnosis.  By my  examination, the patient is euvolemic and is currently  in no acute  distress.  He O2 saturations on room air are 95%.  Her chest x-ray does  reveal mild edema and questionable right lower lobe infiltrate, but she  displays no signs of systemic toxicity and is afebrile.  We will plan to  admit the patient to a telemetry bed to rule out myocardial infarction.  We will cycle cardiac enzymes x2 with serial EKGs.  We will continue  with her home medical regimen as mentioned previously.  Of note, the  patient does have hypothyroidism.  It is unclear of her Synthroid dose,  and this will need to be clarified.  Will consider a transthoracic  echocardiogram to reevaluate her left ventricular function.      Audery Amel, MD  Electronically Signed     SHG/MEDQ  D:  07/05/2006  T:  07/06/2006  Job:  [id number]

## 2010-12-21 NOTE — H&P (Signed)
[place]  Patient:    [name], [name] Visit Number: [id number] MRN: [id number]          Service Type: EMS Location: [place] Attending Physician:  Doug Sou Dictated by:   Sandria Bales. Ezzard Standing, M.D. Admit Date:  04/26/2001 Discharge Date: 04/26/2001   CC:         Vikki Ports, M.D.  Dr. Alric Seton, [facility].   History and Physical  DATE OF BIRTH: 1956-05-25  HISTORY OF PRESENT ILLNESS: The patient is a 55 year old, white female, who apparently presented to the [facility] Emergency Room approximately 3 weeks ago, and was seen by Dr. Carolynne Edouard. I do not have those records at the time of this dictation. Apparently she was found to have gallstones. Because her daughter had been taken care of by Dr. Luan Pulling, she saw Dr. Luan Pulling on the 17th of October, which was this past Thursday. Apparently she was scheduled for elective laparoscopic cholecystectomy this coming Thursday, which will be the 24th of October.  Also she has bilateral breast cysts that they were considering needle lobe excision.  The patient has had a prior history of peptic ulcer disease diagnosed many years ago, takes Pepcid AC, but has had no recent examinations of her upper GI tract, like within the last 5 years. She has had some vague nausea, vomiting, and diarrhea on and off since April of this year, which may be attributed to her gallbladder.  She denies any jaundice. No liver disease, pancreatitis, or colon disease.  ALLERGIES: She has allergies to CIPRO which causes her to have reddish skin. She has allergies to NSAIDS, which give her reddish skin.  MEDICATIONS: Her only medicines are a blood pressure medicine, which she was recently placed on, she cannot remember the name of and Pepcid AC.  REVIEW OF SYSTEMS: PULMONARY: She used to smoke cigarettes but quit. Apparently when she was seen by Dr. Carolynne Edouard 3 weeks ago, a chest x-ray revealed some  scarring in her lungs. Followup x-rays of this revealed this ought to be scarring with nothing more significant. CARDIAC: She was apparently seen at [facility] about 2 years ago, had what just sounds like a Cardiolite scan, which sounds like was negative. She has had no further evaluation from then. She does have hypertension she has had for a number of years. GASTROINTESTINAL: See history of present illness.  UROLOGIC: She has had recurrent bladder infections, but not seeing a urologist. This was treated by Dr. Loreta Ave. She works at [employer].  PHYSICAL EXAMINATION:  VITAL SIGNS: On physical examination, her temperature is 97.0, blood pressure 188/73, pulse 101, respirations 22.  GENERAL APPEARANCE: She is a well-nourished, comfortable-looking white female.  HEENT: Unremarkable.  NECK: Supple without mass or thyromegaly.  LUNGS: Clear to auscultation.  HEART: Regular rate and rhythm without murmur or rub.  BREASTS: I did not examine her breast.  ABDOMEN: She has some vague tenderness but certainly does not localize. She thinks her bladder is full. I really cannot say she is having an acute peritoneal sign. She is moderately obese, so I cannot feel her liver nor does she has gallbladder tenderness that is discrete.  RECTAL: I did not do a rectal examination on her.  EXTREMITIES: She good strength of four extremities.  LABORATORY DATA: Her labs show a sodium of 138, potassium 3.5, chloride of 99, CO2 of 27, glucose of 128, creatinine 0.7. Her calcium is 10.0, alk. phos. 48, bilirubin 0.6.  Her white blood count is 11,900 with high normal being 10,500, hemoglobin is 14, hematocrit 40, and her urinalysis was negative. Her amylase was 74, her lipase is 28.  IMPRESSION: 1. Questionable biliary colic: It looks like her symptoms are resolved at this    time. Dr. Luan Pulling is on call today. We will discuss with her about    whether she needs to proceed  with gallbladder surgery today or just be kept    on her regularly scheduled appointment in about 5 days. 2. Hypertension. 3. Recurrent urinary tract infections. Dictated by:   Sandria Bales. Ezzard Standing, M.D. Attending Physician:  Doug Sou DD:  05/23/01 TD:  05/23/01 Job: [id number]

## 2010-12-21 NOTE — Assessment & Plan Note (Signed)
[facility]                          CHRONIC HEART FAILURE NOTE   Joanne, [name]                    MRN:          [id number]  DATE:09/08/2006                            DOB:          1956/07/06    [name] returns today for followup concerning her congestive heart  failure in the setting of nonischemic cardiomyopathy associated with  chemotherapeutic agents secondary to breast cancer in 2006.  Ms.  [name] is an unfortunate young lady in that her breast cancer has  returned.  She is 2 weeks status post right mastectomy.  She states that  she did well with the surgery and had no complications.  Other than her  emotional stress and surgical procedures, she states she has been doing  well.  She continues to reside with her mom, who also has some health  problems.  Her diabetes is managed by Dr. Vira Browns and Dr. Shelle Iron  continues to monitor her obstructive sleep apnea with previous  intolerance to CPAP and her asthma.  [name] states she has had to  use her inhalers more frequently recently, but denies any orthopnea or  PND, peripheral edema, pre-syncope, or syncopal episodes.  She complains  of some incisional pain around her right breast surgical site.   PAST MEDICAL HISTORY:  1. Includes congestive heart failure secondary to nonischemic      cardiomyopathy with an EF of 20% per cardiac catheterization in      2006.  2. Improvement of ejection fraction to 45-50% by tissue Doppler      electrocardiogram in 2006 as a participant in the COMPARE study.      Status post recent stress Myoview done December 19 showing an EF of      47%.  No definite ischemia.  3. Nonobstructive coronary artery disease by cardiac catheterization      in July of 2006.  4. History of tobacco use.  5. Hypertension.  6. History of breast cancer status post chemotherapy.  7. Osteoarthritis.  8. Anxiety and depression.  9. Hypothyroidism.  10.GERD.  11.Diabetes.  12.Asthma.  13.Obstructive sleep apnea with CPAP nightly.   ALLERGIES:  Include ASPIRIN, CIPRO, SULFA, CODEINE, NAPROSYN.   REVIEW OF SYSTEMS:  As stated above.   CURRENT MEDICATIONS:  1. KCl 40 mEq in the a.m. and 20 in the evening.  2. Altace 10 in the morning and 5 in the evening.  3. Levothyroxine 50 mcg daily.  4. Januvia 100 mg daily.  5. Proventil 2 puffs daily.  6. Buspirone 15 mg daily.  7. Prilosec 40 mg daily.  8. Levemir insulin as directed by Dr. Katrinka Blazing.   PHYSICAL EXAM:  Weight 211, blood pressure 117/68 with a pulse of 100.  A 12-lead EKG done shows normal sinus rhythm with incomplete right  bundle branch block read by Dr. Antoine Poche at a rate of 95.  [name] is in no acute distress.  No jugular venous distension at a 45 degree angle.  LUNGS:  She has fine expiratory wheeze in the right upper lobe.  Otherwise, clear to auscultation.  CARDIOVASCULAR:  Reveals an S1,  S2 with regular rate and rhythm.  ABDOMEN:  Soft and nontender.  Positive bowel sounds.  LOWER EXTREMITIES:  Without cyanosis, clubbing, or edema.  NEUROLOGIC:  The patient is alert and oriented x3.  Cranial nerves 2-12  grossly intact.   IMPRESSION:  Stable heart failure at this time.  Ejection fraction  currently 47% by stress Myoview.  The patient recently diagnosed with  breast cancer again status post mastectomy.  Possibly will need to be  treated with chemotherapy per the patient's report.  However,  chemotherapy has not been initiated at this time.  Will continue current  medications.  Last lab work, the patient's potassium was 3.4 on January  22.  I am going to have her increase her KCl to 40 b.i.d. and have her  follow up with Dr. Gala Romney, her primary cardiologist at the next  visit.      Dorian Pod, ACNP  Electronically Signed      Rollene Rotunda, MD, [facility]  Electronically Signed   MB/MedQ  DD: 09/08/2006  DT: 09/08/2006  Job #: [id number]   cc:   Dr. Vira Browns  Omer Jack., M.D.

## 2010-12-21 NOTE — Assessment & Plan Note (Signed)
[facility]                          CHRONIC HEART FAILURE NOTE   Joanne, [name]                    MRN:          [id number]  DATE:07/10/2006                            DOB:          06/11/1956    [name] is here for further evaluation and medication titration of  her congestive heart failure secondary to nonischemic cardiomyopathy  with an ejection fraction improved to 50%. [name] has not been  seen here in the Heart Failure Clinic since June. Since that time, she  has apparently had some changes in her life. She has had to move in with  her mother who had some health problems. She has been diagnosed with  diabetes and followed up with Dr. Shelle [name] for obstructive sleep apnea  with previous intolerance to CPAP. [name] is very concerned about  her new diagnosis of diabetes and her elevated glucose levels. She is  being followed by Dr. Vira [name] for her Diabetes. She also recently  was admitted to [facility] with an episode of chest discomfort  on December 1. She was observed overnight and discharged home the  following day with negative cardiac markers. [name] continues to  complain of some chest discomfort and light-headedness and some  dizziness and shortness of breath. She is contributing the increased  shortness of breath to her asthma. She states she has been around a lot  of second-hand smoke recently since she moved back in with her mom and  this has been aggravating her asthma and she has had to use her inhaler  more. [name] also has a history of breast cancer status post  mastectomy. She has a routine followup for a MRI and mammogram next week  with her oncologist. [name] states she just saw Dr. Katrinka [name] recently  who increased her Altace to 10 mg in the morning and 5 mg in the  evening. Since this time, the patient states she has become more dizzy  and light-headed. She denies any syncopal  episodes of orthopnea, PND, or  peripheral edema.   PAST MEDICAL HISTORY:  1. Congestive heart failure secondary to nonischemic cardiomyopathy      with an EF of 20% per cardiac catheterization July 2006.  2. Hypertension.  3. Asthma.  4. History of breast cancer status post mastectomy.  5. History of diabetes.  6. Gastroesophageal reflux disease.  7. Hypothyroidism.  8. Anxiety/depression.  9. Hypercholesterolemia.  10.New diagnosis of diabetes.  11.Improvement of ejection fraction to 45-50% by tissue Doppler      electrocardiogram  in October 2006 as a participant in the COMPARE      study.  12.History of tobacco use.  13.Osteoarthritis.  14.Obstructive sleep apnea with CPAP q.h.s.   REVIEW OF SYSTEMS:  As stated above otherwise negative.   ALLERGIES:  ASPIRIN, CIPRO, SULFA, CODEINE, and NAPROSYN.   MEDICATIONS:  1. Lipitor 20 mg daily.  2. Requip 1 tablet daily.  3. Celexa 60 mg daily.  4. Trazodone 25-50 q.h.s.  5. Coreg 12.5 mg b.i.d.  6. Prilosec 20 mg b.i.d.  7. Lasix 80 in the  morning, 40 in the evening.  8. Kay Ciel 40 in the a.m., 20 in the evening.  9. Altace 10 in the morning, 5 in the evening.  10.Levothyroxine 50 mcg daily.  11.Januvia 100 mg daily.  12.Proventil inhaler 2 puffs daily.  13.BuSpar 15 mg daily.   DIAGNOSTICS:  A 12-lead EKG today showed a normal sinus rhythm at a rate  of 90.   Recent lab work during hospitalization for chest pain December 2, H&H  12.6 and 30.7. Sodium 135, potassium 3.0 which was supplemented the p.o.  potassium prior to discharge. Glucose 190, BUN and creatinine 13 and  0.7, magnesium 1.7. Cardiac markers were negative x3 sets. Fasting  lipids showing a total cholesterol of 131, triglycerides 248, HDL 33,  LDL 48.   PHYSICAL EXAMINATION:  VITAL SIGNS:  Weight today is 211 pounds. Blood  pressure left arm 96/63, in the right arm she was 82/58.  GENERAL:  The patient is in no acute distress.  NECK:  No jugular vein  distention at 45 degree angle.  LUNGS:  Clear to auscultation without wheezing.  CARDIOVASCULAR:  Reveals an S1 and S2, regular rate and rhythm.  ABDOMEN:  Soft, nontender, positive bowel sounds.  LOWER EXTREMITIES:  Without clubbing or cyanosis. The patient has a  trace of edema in the bilateral lower ankle area.   IMPRESSION:  The patient's heart failure appears to be stable at this  time. However, she is:  1. Hypotensive and symptomatic. I am going to have her decrease her      Altace back to 5 mg b.i.d. She has a followup appointment with Dr.      Katrinka [name] tomorrow. I have recommended maybe increasing it to 7.5 in      the morning and 5 in the evening to see if she tolerates this dose.      Will not be adjusting her Coreg dose as she has had improvement in      her ejection fraction at the current dose and blood pressure will      not for increased titration.  2. The patient has had some chest discomfort recent admission to the      hospital, ruled out for myocardial infarction at that time;      however, I feel that [name] probably needs to be evaluated in      the form of a stress test. She did have a cardiac catheterization      back in 2006 that showed severe global left ventricular dysfunction      in the setting of nonobstructive coronary artery disease. Will      discuss with Dr. Gala [name] but plan on going ahead and      repeating the stress test and will check a BMET today to reevaluate      the patient's hypokalemia from recent hospitalization.      Dorian Pod, ACNP  Electronically Signed      Joanne Buckles. Bensimhon, MD  Electronically Signed   MB/MedQ  DD: 07/10/2006  DT: 07/10/2006  Job #: [id number]   cc:   Joanne Browns, MD

## 2010-12-21 NOTE — Cardiovascular Report (Signed)
[name], [name]             ACCOUNT NO.:  [id number]   MEDICAL RECORD NO.:  [id number]          PATIENT TYPE:  INP   LOCATION:  4743                         FACILITY:  [facility]   PHYSICIAN:  Charlies Constable, M.D. [facility] DATE OF BIRTH:  09-24-1955   DATE OF PROCEDURE:  02/18/2005  DATE OF DISCHARGE:                              CARDIAC CATHETERIZATION   PROCEDURE:  Right and left heart catheterization and coronary angiography.   CLINICAL HISTORY:  [name] is 55 years old and was evaluated about two  weeks ago at [facility] for shortness of breath where she had an  echocardiogram where she says her ejection fraction was about 20%.  She  developed increased symptoms of shortness of breath and was admitted through  our emergency room here.  She was thought to be in congestive heart failure  and was treated with IV diuretics.  She had previously had breast cancer and  had been treated with chemotherapy by Dr. Pierce Crane over the last few  months and has an indwelling Port-A-Cath.   DESCRIPTION OF PROCEDURE:  Right heart catheterization was performed  percutaneously via the right femoral vein using the venous sheath and Swan-  Ganz ____________ catheter.  Left heart catheterization was performed  percutaneously via the right femoral artery using an arterial sheath and 6  French preformed coronary catheters.  When we advanced the left coronary  catheter into the ostium, there was damping until we removed it.  We then  took some ____________ shots, and the left main appeared to be patent  although small.  She then developed hypotension, with systolic blood  pressures down in the 60s, and we started her on dopamine.  She also had  some mild chest discomfort.  After her blood pressure stabilized on  dopamine, we went in with 4 French catheters and performed selective  coronary angiography of the left coronary and the right coronary, both with  4 French coronary catheters.  We  performed a distal aortogram at the very  beginning, thinking we might need to put a balloon pump in, but this was not  necessary.  Near the end of the procedure, she developed marked tachycardia  up to 160, even after we turned off the dopamine, and her blood pressures  were in the high/normal range.  Her wedge pressure went from an initial  value of about 20 up to about 35, and we gave her 40 mg of IV Lasix.  She  finally stabilized, and her heart rate came down to 105 which was baseline,  and her pulmonary artery pressure came down to about 30 which was below  baseline.  Her blood pressure was about 105.  She left the laboratory in  serious but stable condition.   RESULTS:  Left main coronary artery:  The left main coronary was free of  significant disease.   Left anterior descending artery:  The left anterior descending artery gave  rise to three diagonal branches and two separate perforators.  The LAD was  irregular, but there was no significant obstruction.   Circumflex artery:  The circumflex artery gave  rise to a marginal branch and  two posterolateral branches.  These vessels were free of significant  disease, although there were irregularities.   Right coronary artery:  The right coronary artery gave rise to a conus  branch, a right ventricular branch, a posterior descending branch, and a  posterolateral branch.  The right coronary artery was irregular with no  major obstruction.   LEFT VENTRICULOGRAM:  The left ventriculogram was performed in the RAO  projection and showed global hypokinesis with an estimated ejection fraction  of 20-25%.  There appeared to be significant narrowing in the right distal  iliac of greater than 70%.  The renal arteries were patent and free of major  obstruction.   HEMODYNAMIC DATA:  The right atrial pressure was 15 mean.  The pulmonary  artery pressure was 42/25, with a mean of 34.  The pulmonary wedge pressure  was 24.  Left ventricular  pressure was 91/27, and the artery pressure was  91/65, with a mean of 77.  Cardiac output/cardiac index by Fick was 6.3/3.5  L/minute/sq m.  This cardiac cath was performed when the patient was  tachycardic, with a heart rate of about 130, although she was off of the  dopamine at the time.   CONCLUSION:  1.  Nonobstructive coronary artery disease with luminal irregularities in      the left anterior descending artery, circumflex, and right coronary      arteries.  2.  Severe global left ventricular dysfunction, with an ejection fraction of      20%, consistent with a nonischemic cardiomyopathy.   RECOMMENDATIONS:  The patient's hemodynamics were quite labile during the  procedure, although she appears to be more stable now.  She has severe left  ventricular dysfunction and has a nonischemic cardiomyopathy which may be  related to her recent chemotherapy.  I discussed the situation with Dr.  Gala Romney.  Her cardiac output now is quite high which means her systemic  resistance is fairly low.  We will plan to add digoxin to her regimen, and  she was given 40 mg of Lasix during the procedure.       BB/MEDQ  D:  02/18/2005  T:  02/19/2005  Job:  [id number]   cc:   Katrinka Blazing, M.D.  [address] [place], [place], [place]   Arvilla Meres, M.D. [facility]

## 2010-12-21 NOTE — Consult Note (Signed)
[place]  Patient:    [name], [name] Visit Number: [id number] MRN: [id number]          Service Type: EMS Location: [place] Attending Physician:  Doug Sou Dictated by:   Ollen Gross. Vernell Morgans, M.D. Proc. Date: 04/26/01 Admit Date:  04/26/2001                            Consultation Report  HISTORY OF PRESENT ILLNESS:  [name] is a 55 year old white female who states that she has had some chronic back pain since May of this year.  She yesterday had some potato chips and some fatty foods and last night developed pain in her epigastric region.  The pain worsened during the night and radiated to her back and she sought medical attention in the emergency department this morning.  Shortly after arriving in the emergency department, her pain resolved and she is fairly comfortable now.  She has not had much nausea or vomiting.  She denies any fevers, chills.  She has in the last year had chest pain, chest tightness and shortness of breath that was worked up by the [facility] cardiac group with a stress test that by her report is normal.  She denies any fevers, chills, diarrhea, dysuria.  She does have chronic back pain.  The rest of her review of systems is unremarkable.  PAST MEDICAL HISTORY:  Significant for a recently found lung mass that has not been diagnosed yet.  She is set for a chest CT in the next 1-1/2 weeks to two weeks.  She has peptic ulcer disease, hypertension, chronic back pain.  PAST SURGICAL HISTORY:  Bilateral wrist surgeries, two sinus surgeries and a left knee surgery, transabdominal hysterectomy for fibroids and a tubal ligation.  MEDICATIONS:  Hydrochlorothiazide, Pepcid AC, Tylenol, and nicotine gum.  ALLERGIES:  NSAIDS, NAPROSYN, CIPRO AND SULFA DRUGS.  SOCIAL HISTORY:  She quit smoking about two weeks ago and quit drinking about 10 years ago.  She works at [employer].  FAMILY HISTORY:  Significant for  gallstones in her children.  PHYSICAL EXAMINATION:  GENERAL:  She is a well-developed, well-nourished white female in no acute distress.  SKIN:  Warm and dry, no jaundice.  HEENT:  Extraocular muscles intact.  Pupils are equal, round and reactive to light.  NECK:  No bruits.  LUNGS:  Clear to auscultation bilaterally.  HEART:  Regular rate and rhythm.  ABDOMEN:  Soft with very mild tenderness in her right upper quadrant with no guarding or peritoneal signs.  EXTREMITIES:  No cyanosis, clubbing or edema.  NEUROLOGIC:  Alert and oriented x 4.  HEMATOLOGIC:  Could palpate no lymphadenopathy.  LABORATORY DATA:  Her urine is negative.  White is 10,500 with 54 segs, 37 lymphs, 6 monos.  Hemoglobin 14.3, hematocrit 41.6, platelet count 365,000. Sodium 138, potassium 3.3, chloride 100, CO2 30, BUN 10, creatinine 0.7, glucose 119, total bilirubin 0.6, SGOT 23, SGPT 23 and alk phos 48.  Amylase 61.  ASSESSMENT AND PLAN:  This is a 55 year old white female with some gallstones and some early cholecystitis who is reasonably pain-free now.  She also has a recently found undiagnosed lung mass.  In the face of her current medical problems, I think it would be wise at this point to treat her with antibiotic therapy to temporize the early cholecystitis and give [place] time to work up her lung mass appropriately prior to embarking on  surgery.  Certainly we will have her follow up with [place] in the clinic in the next couple weeks.  Certainly if she worsens, she has out office number and can call us at any time day or night. Otherwise, we will put her on antibiotics and give her some pain medicine and let her be discharged from the emergency department with follow-up to the clinic in the near future. Dictated by:   Ollen Gross. Vernell Morgans, M.D. Attending Physician:  Doug Sou DD:  04/26/01 TD:  04/26/01 Job: [id number]

## 2010-12-21 NOTE — Op Note (Signed)
[name], SENEGAL             ACCOUNT NO.:  [id number]   MEDICAL RECORD NO.:  [id number]          PATIENT TYPE:  AMB   LOCATION:  SDS                          FACILITY:  [facility]   PHYSICIAN:  Rose Phi. Maple Hudson, M.D.   DATE OF BIRTH:  08-May-1956   DATE OF PROCEDURE:  08/27/2006  DATE OF DISCHARGE:                               OPERATIVE REPORT   PREOPERATIVE DIAGNOSIS:  Recurrent carcinoma of the right breast.   POSTOPERATIVE DIAGNOSIS:  Recurrent carcinoma of the right breast.   OPERATION:  Right total mastectomy.   SURGEON:  Rose Phi. Maple Hudson, M.D.   ANESTHESIA:  General.   OPERATIVE PROCEDURE:  After suitable general anesthesia was induced, the  patient was placed in the supine position with arms extended on the arm  board and the right breast prepped and draped in usual fashion.  Transverse elliptical incision incorporating the nipple-areolar complex  and the lumpectomy site was then outlined with a marking pencil.  Incisions were made and the flaps dissected in the standard fashion  going superiorly to near the clavicle using the cautery and medially to  the medial border of the sternum and inferiorly to the inframammary fold  at the rectus fascia and laterally to latissimus dorsi muscle.   We then removed the breast by dissecting from medial to lateral  incorporating the pectoralis fascia.  At the lateral margin of the  breast we could carefully palpated into the axilla.  She had had a  previous sentinel node biopsy so it is fairly scarred.  There were no  palpable abnormalities.   We terminated the mastectomy at this point.  Hemostasis obtained with  cautery.  We thoroughly irrigated the field with saline.   A 19-French Blake drain was inserted and brought out through separate  stab wound.   The skin was then stapled and dressings applied and the patient  transferred to the recovery room in satisfactory condition having  tolerated procedure well.      Rose Phi.  Maple Hudson, M.D.  Electronically Signed     PRY/MEDQ  D:  08/27/2006  T:  08/27/2006  Job:  [id number]

## 2010-12-21 NOTE — Discharge Summary (Signed)
[name], [name]             ACCOUNT NO.:  [id number]   MEDICAL RECORD NO.:  [id number]          PATIENT TYPE:  INP   LOCATION:  4706                         FACILITY:  [facility]   PHYSICIAN:  Learta Codding, M.D. LHCDATE OF BIRTH:  1956/04/15   DATE OF ADMISSION:  02/17/2005  DATE OF DISCHARGE:  02/21/2005                           DISCHARGE SUMMARY - REFERRING   SUMMARY OF HISTORY:  [name] is a 55 year old white female who presents  to [facility] Emergency Room complaining of shortness of breath associated  with chest discomfort.  She stated that this started approximately nine  weeks prior to admission and progressively worsened.  Prior to her shortness  of breath, she could walk around a football field without difficulty and at  the time of admission, she could not walk 20 yards.  She feels that her  symptoms have started after her chemotherapy for her breast cancer.  However, she could not name the specific regimen at the time of admission.  She described two pillow orthopnea, PND without lower extremity edema or  weight fluctuations.  She recently had a stress test and echo at [facility] and we are told that were abnormal and referred to [facility] for an outpatient appointment.  However, she came to the  emergency room because symptoms worsened.  Her history is notable for breast  cancer.  She underwent a lumpectomy after discovering a lump on a self-  breast exam.  Chemotherapy was finished in April and radiation in the week  proceeding her admission.  Her chemotherapy agents include Fluorouracil,  Epirubicin and cyclophosphamide.  Her history is also noted for gallstones,  cholecystectomy, hypertension, depression and asthma for which she takes  inhaler.  She has also had a recent abnormal Pap smear and is due for  another Pap smear in approximately four months.   LABORATORY DATA:  Admission chest x-ray showed interstitial and asymmetry  air space  disease, right greater than left, with asymmetry pulmonary edema.  Infection could not be totally excluded.   Admission weight was 188.7.  Discharge weight was 186.3.  Admission H&H was  12.4 and 36.6, normal indices, platelets 308, wbc 6.9.  These remained  unchanged throughout her admission.  Prior to discharge on February 20, 2005,  H&H was 13.1 and 38.0, normal indices, platelets 154, wbc 6.4.  Admission PT  was 13.6, INR 1.0, PTT 24.  D-dimer 0.57.  Subsequent PT/INRs have not been  performed.  Admission sodium was 137, potassium 3.4, BUN 10, creatinine 0.7.  Normal LFTs.  Albumin was slightly low at 3.3.  Magnesium 2.0.  Phosphorus  5.5.  Subsequent chemistry on February 18, 2005, showed a potassium of 3.3,  however, it was 3.5 on February 19, 2005.  Chemistry prior to discharge showed a  sodium of 134, potassium 3.6, BUN 13, creatinine 0.8.  CK-MBs x3 were  negative for myocardial infarction.  Admission BNP was 571.9.  BNP on February 19, 2005, was 342.  Fasting lipids showed a total cholesterol of 195,  triglycerides 209, HDL 39, LDL 114. TSH was 2.289.   EKGs  showed normal sinus rhythm, sinus tachycardia, left axis deviation,  left anterior hemiblock, delayed R-wave, nonspecific STT wave changes.   HOSPITAL COURSE:  [name] was admitted to the coronary care unit by Dr.  Horton Marshall for diuresis.  Echocardiogram  from [facility] showed an  EF of 40% per report.  It was noted that this had been performed  approximately two weeks ago.  Dr. Gala Romney elaborated that she had  completed six rounds of chemotherapy on April 2006, with the previously  mentioned regimen.  Post diuresis, her breathing had improved.  It was felt  that her chest discomfort was atypical but given her decreased LV function  and CHF, cardiac catheterization was arranged.  This was performed on February 18, 2005, by Dr. Juanda Chance.  EF was 20 to 25% with global hypokinesis.  She had  three vessel irregularities but no  significant obstruction.  Dr. Juanda Chance also  agreed that her nonischemic cardiomyopathy was probably secondary to  chemotherapy agents. She was placed on Dopamine and over the next several  days, she continued to diurese and medications were adjusted depending on  her blood pressures.  Case management also became involved to assist with  discharge needs.  By February 21, 2005, she was ambulating on the floor without  difficulty.  Her CHF had resolved and she was now euvolemic.  However, at  the time of discharge, she did remain slightly tachycardic with a heart rate  in the 90s to 100s.  Her hypotension had improved and blood pressure was  being maintained in the 110s systolically.  Echocardiogram  had been  performed on February 18, 2005, that showed mild mitral annular calcification  and it was felt that it was very limited due to poor sound wave  transmission.  Felt that the LV function was significantly reduced but  difficult to quantitate.   DISCHARGE DIAGNOSES:  1.  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  2.  Hypertension.  3.  Hypokalemia.  4.  Patient does not have a diagnosis of asthma.  5.  Anticoagulation with dilated cardiomyopathy.  6.  History of breast cancer, status post mastectomy and chemotherapy      treatment as well as radiation.  7.  History as previously.   Patient is discharged home.  She was advised to maintain a low sodium, less  than 2 g per day, and a low fat and cholesterol diet.  Her activity is not  necessarily restricted.  She was asked to observe her catheterization site  for any difficulties per our discharge catheterization instructions.  She  was asked to obtain daily weights as well as create a blood pressure diary.  She was asked to bring all weights, blood pressure and medications to all appointments.  She will follow up with Dr. Gala Romney in the CHF clinic on  February 28, 2005.  She will have a PT/INR  on Monday with Dr. Donnie Coffin.  She will  call to arrange this.  Her target INR is between 2 and 3.   MEDICATIONS:  1.  Nexium 40 mg daily.  2.  Celexa 20 mg daily.  3.  Warfarin 1 mg daily.  4.  She was given permission to continue her Tylenol, Flexeril, Tramadol,      diazepam, Imodium, Zofran and Ketac as previously.  5.  Her new medications include:      1.  Digoxin 0.25 mg daily.  2.  Altace 1.25 mg daily.  6.  His furosemide was increased to 40 mg b.i.d.  7.  Her Toprol was increased to 25 mg daily.  8.  She was advised not to take her Spiriva, Celebrex, Accupril or      albuterol.   At the time of follow-up with Dr. Gala Romney, a BMET and BNP will also be  performed.  Prior to her discharge, she did receive education in regard to  her Coumadin therapy as well as congestive heart failure.       EW/MEDQ  D:  02/21/2005  T:  02/21/2005  Job:  [id number]   cc:   Arvilla Meres, M.D. [facility]   Vira Browns, M.D.  [facility] [place], [place]   Pierce Crane, M.D.  [address] [facility]  [place]  [place] [address]  Fax: [phone]

## 2010-12-21 NOTE — Op Note (Signed)
[name], [name]             ACCOUNT NO.:  [id number]   MEDICAL RECORD NO.:  [id number]          PATIENT TYPE:  AMB   LOCATION:  DAY                          FACILITY:  [facility]   PHYSICIAN:  Lorre Munroe., M.D.DATE OF BIRTH:  06-24-1956   DATE OF PROCEDURE:  07/20/2004  DATE OF DISCHARGE:                                 OPERATIVE REPORT   PREOPERATIVE DIAGNOSIS:  Breast cancer with anticipated chemotherapy.   OPERATION:  Placement of central venous access port.   SURGEON:  Zigmund Daniel, M.D.   ANESTHESIA:  Local with monitored anesthesia care and sedation.   DESCRIPTION OF PROCEDURE:  After the patient was monitored and sedated and  had routine preparation and draping of the anterior neck and chest, I first  infiltrated local anesthetic in the area of the left deltopectoral groove.  Using a long needle, I was able to access the subclavian vein on the first  attempt, but blood flow was not very good and despite attempts at  repositioning to get better access, I could not get the J-wire to go down.  When I again tried to access the vein, I was unable to achieve secure  access.  I decided at that point that I would place the port on the right  side.  I then infiltrated local anesthetic in the right anterior-superior  chest wall and then the right lower part of the neck and then using a 22  gauge needle, I accessed the internal jugular vein.  I then followed that  same path and accessed the internal jugular vein with the large needle and  passed the J-wire down, and it went down nicely into the superior vena cava.  I then created the pocket for the port on the upper anterior chest wall,  removing a small button of subcutaneous fat to make the port lie comfortably  in the subcutaneous tissues.  I then passed the venous tubing through the  venous access incision, and I secured the reservoir with two sutures of 2-0  Prolene placed into the deep subcutaneous tissues.  The  port lay in a very  comfortable position.  I then estimated the necessary length of tubing to  reach to the caval atrial junction and cut the tubing there.  I then put the  patient back in Trendelenburg position, accessed the subclavian vein with a  dilator and introducer, removed the dilator, and passed the venous tubing  through, and removed the dilator sheath.  Fluoroscopy confirmed good  position.  I dissected the area until I was sure there were no kinks or  twists.  When accessed with a Huber needle, the device allowed good  withdrawal of blood and good infusion of fluid.  After flushing it  thoroughly with dilute heparin, I flushed it with heparin concentrate and  removed the Huber needle.  I closed the incisions with intracuticular 4-0  Vicryl and Steri-Strips.  She tolerated the operation well.     Will  WB/MEDQ  D:  07/20/2004  T:  07/20/2004  Job:  [id number]   cc:   Pierce Crane, M.D.  [address] - [facility]  [place]  [place] [address]  Fax: [phone]

## 2010-12-21 NOTE — Discharge Summary (Signed)
[name], [name]             ACCOUNT NO.:  [id number]   MEDICAL RECORD NO.:  [id number]          PATIENT TYPE:  INP   LOCATION:  4705                         FACILITY:  [facility]   PHYSICIAN:  Mobolaji B. Bakare, M.D.DATE OF BIRTH:  18-Aug-1955   DATE OF ADMISSION:  09/25/2005  DATE OF DISCHARGE:  09/27/2005                                 DISCHARGE SUMMARY   FINAL DIAGNOSES:  1.  Anxiety.  2.  Depression.  3.  Noncardiac chest pain.   SECONDARY DIAGNOSES:  1.  Nonischemic cardiomyopathy.  2.  Severe sleep apnea.  3.  Hyperlipidemia.   PROCEDURES:  1.  Chest x-ray showed cardiomegaly, probably early interstitial edema.  2.  CT angiogram of the chest negative for pulmonary emboli, although as      recorded in the E-chart report, it stated evidence of pulmonary emboli,      in the body of the report, there are no filling defects in the pulmonary      arteries to suggest pulmonary emboli.  I did discuss this discrepancy      with the radiologist and he will be making amendment to this error of      transcription.   BRIEF HISTORY:  [name] is a 55 year old Caucasian female with multiple  medical problems including ischemic cardiomyopathy, congestive heart  failure, and history of breast cancer in remission.  She stated that she has  been having increased shortness of breath in the last two weeks prior to  presentation.  This became worse on the day of admission when she was with  her therapist.  She has had some anxiety issues and she is seeing a  therapist.  On this particular day in the therapist office, the patient's  shortness of breath became acutely worse and she had chest pain.  She denied  palpitations and diaphoresis.  She was advised to come to the emergency room  by her therapist.  The initial evaluation reveals a normal EKG and cardiac  enzymes at the point of care was normal.  Of note, is that the patient has  had coronary angiogram in July 2006 which showed  nonobstructive coronary  artery disease with luminal irregularities in the left anterior descending,  circumflex, and right coronary arteries, there is severe global left  ventricular dysfunction with ejection fraction of 20%, consistent with  nonischemic cardiomyopathy.   Chest x-ray on arrival did not show CHF.  BNP was normal at 67.  Ms.  [name] was admitted to rule out MI.   Shortness of breath/chest pain.  The initial report of CT angiogram of the  chest was interpreted to be positive for pulmonary embolus but within the  body of the report, it stated no filling defects.  I discussed with the  radiologist and review of CT again was accomplished and is that the patient  does not have pulmonary embolism.  She did rule out for myocardial  infarction with three sets of cardiac enzymes.  She denied heartburn or  symptoms of reflux.  Chest pain and shortness of breath resolved completely  within 24 hours of hospitalization.  This was felt to be non-cardiac chest  pain probably secondary to anxiety disorder.  The patient was given Xanax  p.r.n.   All other medical problems were stable during this hospitalization.  The  patient was continued on all her home dose medications.   Hyperlipidemia.  The patient's fasting lipid profile showed elevated LDL.  She did indicate that she has been struggling with control of her  cholesterol by diet.  I discussed with the patient and she is in agreement  with starting Statin.  She was given a prescription for Lipitor.   CONDITION ON DISCHARGE:  Stable.   PERTINENT LABORATORY DATA:  Total cholesterol 203, triglycerides 254, HDL  41, LDL 111.  Hemoglobin A1C 6.3.  White cells 8.4, hemoglobin 12.2,  hematocrit 35.6, MCV 89.9, platelets 280.  Sodium 126, potassium 3.8,  chloride 100, CO2 27, glucose 112, BUN 15, creatinine 0.7, calcium 9.   DISCHARGE MEDICATIONS:  Lipitor 20 mg once daily, trazodone 25 mg 1-2  tablets p.r.n. q.h.s., Xanax 0.5 mg  q.4-6h. p.r.n., Nexium 40 mg daily,  Celexa 40 mg daily, digoxin 250 mcg daily, furosemide 80 mg b.i.d.,  potassium chloride 40 mEq b.i.d., Altace 2.5 mg b.i.d., Tylenol PM, Flexeril  10 mg p.r.n., Zofran 8 mg p.r.n., prochlorperazine 10 mg p.r.n., Imodium  p.r.n., levofloxacin 50 mcg daily, propoxyphene 65 mg p.r.n.   FOLLOW UP:  The patient will be following up with psychologist in one week  and also psychiatrist within the next 2-3 weeks.      Mobolaji B. Corky Downs, M.D.  Electronically Signed     MBB/MEDQ  D:  10/09/2005  T:  10/09/2005  Job:  [id number]   cc:   Vira Browns, M.D.   Marcelyn Bruins, M.D. [facility]  [address]  [place]  [place] [address]   Arvilla Meres, M.D. [facility]  [facility]  [address]  [place]  [place] [address]

## 2010-12-21 NOTE — Discharge Summary (Signed)
NAMEIZABELA, OW             ACCOUNT NO.:  [id number]   MEDICAL RECORD NO.:  [id number]          PATIENT TYPE:  INP   LOCATION:  3733                         FACILITY:  [facility]   PHYSICIAN:  Doylene Canning. Ladona Ridgel, MD    DATE OF BIRTH:  [birth year]   DATE OF ADMISSION:  07/05/2006  DATE OF DISCHARGE:  07/06/2006                               DISCHARGE SUMMARY   PRIMARY CARDIOLOGIST:  Dr. Arvilla Meres.   PRIMARY DIAGNOSIS:  Noncardiac chest pain.   SECONDARY DIAGNOSES:  1. Nonischemic, nonobstructive coronary artery disease per cardiac      catheterization in 2006.  2. Hypertension.  3. Asthma.  4. History of breast cancer status post mastectomy.  5. History of diabetes.  6. Gastroesophageal reflux disease.  7. Hypothyroidism.  8. Anxiety disorder.  9. Depression.  10.Hypercholesterolemia.  11.Severe global left ventricular dysfunction with an ejection      fraction of 20% consistent with nonischemic cardiomyopathy per      cardiac catheterization February 18, 2005.   PROCEDURES PERFORMED DURING THIS HOSPITALIZATION:  None.   HISTORY OF PRESENT ILLNESS:  A 55 year old Caucasian female with history  as stated above presented to the emergency room with chest pain and  anxiety.  The patient has been having this pain paroxysmally x5 days.  The patient states that on day of admission prior to ER the patient had  7/10 sharp chest pain radiating to the left neck.  The patient had  experienced similar episodes in the past and it has been attributed to  anxiety.  The patient also complained of some right arm numbness during  the episode.  She denied any shortness of breath or orthopnea.   HOSPITAL COURSE:  On arrival to the emergency room the patient's blood  pressure was 132/74, heart rate 99, respirations 24, temperature 97.7,  with an O2 sat of 95% on room air.  The patient was treated with  observation only and placed on prior home medications.  Cardiac enzymes  were followed  along with EKGs throughout hospitalization.  Troponins  were found to be negative along with other cardiac biomarkers.  The  patient was found to be chest pain-free the following morning, without  any recurrence of chest discomfort.  The patient's heart rate and blood  pressure were normalized and the patient was stable for discharge.   LABS ON DISCHARGE:  Sodium 135, potassium 3, chloride 98, CO2 27,  glucose 190, BUN 13, creatinine 0.5, calcium 8.8, magnesium 1.7.  Troponins were negative x2.  EKG was found to be normal sinus rhythm  without evidence of ischemia.   FOLLOWUP LABS AND APPOINTMENTS:  The patient is scheduled to see Dr.  Gala Romney next week.  She is to keep that appointment.  The patient  should be planned for Myoview stress test after seeing Dr. Gala Romney at  his discretion at the request of Dr. Ladona Ridgel.  The patient has been told  to continue her current medication regimen.   DISCHARGE MEDICATIONS:  1. Altace 10 mg q.a.m., 5 mg q.p.m.  2. Celexa 60 mg q.a.m.  3. Coreg 12.5 mg b.i.d.  4. Flexeril 10 mg at bedtime.  5. Lasix 80 mg q.a.m., 40 mg every p.m.  6. Potassium 40 mEq q.a.m., 20 mEq q.p.m.  7. Proventil nebulizer treatments p.r.n.  8. Januvia 1000 mg q.a.m.  9. Lipitor 20 mg at bedtime.  10.Nexium 40 mg daily.  11.Trazodone 50 mg at bedtime.  12.Synthroid per home dosing.   ALLERGIES:  ASPIRIN, CIPRO, NSAIDS, AND SULFA.   The patient will be given dose of 40 mEq of potassium prior to discharge  secondary to hypokalemia noticed on this a.m. lab.  The patient will be  followed as stated above.   Duration of discharge including M.D. time with patient greater than 35  minutes.      Bettey Mare. Lyman Bishop, NP      Doylene Canning. Ladona Ridgel, MD  Electronically Signed    KML/MEDQ  D:  07/06/2006  T:  07/07/2006  Job:  [id number]

## 2010-12-21 NOTE — Consult Note (Signed)
[name], [name]             ACCOUNT NO.:  [id number]   MEDICAL RECORD NO.:  [id number]          PATIENT TYPE:  INP   LOCATION:  1824                         FACILITY:  [facility]   PHYSICIAN:  Greggory Stallion L. Pernell Dupre, M.D.  DATE OF BIRTH:  [birth year]   DATE OF CONSULTATION:  02/17/2005  DATE OF DISCHARGE:                                   CONSULTATION   PRIMARY CARE PHYSICIAN:  Dr. Vira Browns at [facility] [place], [place].   CHIEF COMPLAINT:  Shortness of breath and chest pain.   HISTORY OF PRESENT ILLNESS:  This is a 55 year old Caucasian female with a  history of breast cancer, hypertension, and asthma who presents with  worsening shortness of breath and chest pain. The patient states the  shortness of breath started nine weeks ago and has progressively worse. He  states before nine weeks ago she could walk around the football and now she  can walk hardly 20 yards. The patient states there has been a progressive  decline and states that symptoms started after chemotherapy. She cannot name  a regimen and also this was before her radiation. Associated symptoms were  chest pain which started approximately two weeks ago preceded by the  shortness of breath. The chest pain was described as needles over her left  breast relieved with breathing treatments, associated with nausea or  vomiting. No diaphoresis. No syncope. Two-pillow orthopnea. Positive PND. No  lower extremity edema. No weight fluctuations. No fever, chills, or night  sweats. She has been having cough productive of yellow sputum. No lumps,  bumps, or rashes. The patient was recently evaluated at [facility] and had a  stress test and transthoracic echo performed and she was told that these  were abnormal. She was referred to [facility] where she had an appointment with  Dr. Corinda Gubler on August 2nd. The patient came to the emergency room tonight  because of worsening shortness of breath and received Lasix in the emergency  room.  After 1800 cc out she feels significantly better.   PAST MEDICAL HISTORY:  1.  Breast cancer; had a lumpectomy in her right breast. She finished her      chemotherapy in April and radiation on Thursday. She states she is in      remission. Her oncologist is Dr. Pierce Crane, [facility].  2.  Gallstones, cholecystectomy.  3.  Hypertension.  4.  Stress test two years ago which was normal by report, and then had      another stress test that is described as a nuclear stress test,      pharmacologic, which she was states was abnormal as well as an      echocardiogram was abnormal.  5.  Depression.  6.  Asthma in which she takes inhalers.   ALLERGIES:  CIPROFLOXACIN causes erythema; NSAIDs cause erythema; ASPIRIN  causes hives; SULFA; CODEINE causes her to be nauseous; NAPROSYN.   MEDICATIONS:  1.  Tylenol.  2.  Accupril 20 daily.  3.  Flexeril 10 p.r.n.  4.  Nexium 40 daily.  5.  Celebrex 200 p.r.n.  6.  Tramadol 50 p.r.n.  7.  Celexa 20 daily.  8.  Warfarin 0.5 daily in which she states she takes it because of her blood      being too thick and having multiple blood draws.  9.  Toprol-XL 12.5 mg daily.  10. Lasix 20 mg daily.  11. Albuterol inhaler p.r.n.  12. Spiriva 18 mcg daily.  13. Ketek (telithromycin) 800 mg daily in which she is in a two of five day      course.   SOCIAL HISTORY:  Lives in [place] alone. She is disabled. She has one  child and recently separated. An 80-pack per year history of tobacco, quit  two years ago. Occasional alcohol. No drugs.   FAMILY HISTORY:  Mother is alive, age 43, with diabetes, renal artery  stenosis, hypercholesterolemia, hypertension,  peptic ulcer disease. Father  passed away at 25 from CAD,  peptic ulcer disease, hypertension,  hypercholesterolemia, and diabetes. Siblings--two brothers, one with  hypertension.   REVIEW OF SYSTEMS:  Remarkable for chest pain, shortness of breath, dyspnea  on exertion, orthopnea, or  cough.   PHYSICAL EXAMINATION:  VITAL SIGNS: She has a pulse of 109, saturation 94%  on room air.  GENERAL: Alert and oriented times three with oxygen on and in no apparent  distress.  HEENT: Pupils equal, round, and reactive to light. Extraocular movements are  intact. No lymphadenopathy.  NECK: JVP approximately 3/4's way up the jaw. No carotid bruits. No  thyromegaly.  CARDIOVASCULAR: Tachycardic. Normal S1 and S2 with a 1/6 systolic ejection  murmur in the left lower sternal border.  LUNGS: Crackles at the bilateral lower lung bases.  SKIN: No rashes or lesions.  ABDOMEN: Soft, obese, nontender, nondistended, positive bowel sounds.  Positive hepatojugular reflex. No hepatosplenomegaly.  EXTREMITIES: No clubbing, cyanosis, or edema. Strong radial and pedal  pulses.   Chest x-ray shows cardiomegaly with bilateral pulmonary vascular congestion  with Port-A-Cath in place. EKG reveals rhythm of 118, sinus tachycardia,  axis 32, PR with an QRS of 84, QTC of 476, Q's in V1 and V2, flat T's in V1  and V2, and I in aVL.   LABORATORY DATA:  H&H 13.2/39. Sodium 137, potassium 3.4, BUN 10, creatinine  0.7, glucose 106. Total bilirubin 0.9, AST 22, ALT 20, alkaline phosphatase  78.  Two sets of cardiac enzymes which are negative.  A total protein of 6.8  and albumin of 3.3. D-dimer of 0.57, calcium 9.1. PTT 24, PTT 13.6, and INR  of 1.0.  ABG on 7.5, 6, 27, and 24.   ASSESSMENT:  This is a 55 year old Caucasian female with a history of breast  cancer recently treated with chemotherapy who presents with a heart failure  exacerbation.   PLAN:  1.  Follow up shortness of breath most likely secondary to heart failure      exacerbation considering subjective/objective history as well as 1800 cc      response to Lasix. The patient cannot tell the chemotherapy regimen she     received which could be the culprit considering the timing. Will check a      TSH, rule out three sets of cardiac  enzymes, and will order a      transesophageal echocardiogram. Will continue Toprol XL, Accupril, and      IV Lasix. The patient is allergic to ASPIRIN, and will hold antiplatelet      treatment for now. Check a BMP, strict I&Os, and daily weights with the  Lasix as well as replace her electrolytes, specifically potassium and      magnesium if needed.  2.  Chest pain, atypical in nature. Will have her obtain an EKG considering      a prior septal infarct. Rule out three sets of cardiac enzymes. If EF is      depressed and no other culprit is found we will consider heart      catheterization. Will also check a lipid panel and a hemoglobin A1C.  3.  Asthma. Continue Spiriva daily.  4.  Infectious disease. The patient is currently being treated for an upper      respiratory tract infection. Will continue treatment times three days to      finish the course.  5.  Depression. Continue Celexa.  6.  GI. Continue Nexium 40 daily.  7.  Hypokalemia. Replace with 40 mEq of potassium chloride and per day as      needed.       GLA/MEDQ  D:  02/17/2005  T:  02/17/2005  Job:  [id number]   cc:   Dr. Vira Browns  [facility], [place], [place]

## 2010-12-21 NOTE — Op Note (Signed)
[place]  Patient:    [name], [name] Visit Number: [id number] MRN: [id number]          Service Type: DSU Location: [id number] Attending Physician:  Vikki Ports. Dictated by:   Earna Coder, M.D. Proc. Date: 06/01/01 Admit Date:  05/29/2001 Discharge Date: 05/30/2001                             Operative Report  PREOPERATIVE DIAGNOSIS: 1. Abnormal bilateral mammogram. 2. Symptomatic cholelithiasis.  POSTOPERATIVE DIAGNOSIS: 1. Abnormal bilateral mammogram. 2. Symptomatic cholelithiasis.  OPERATION PERFORMED: 1. Bilateral needle localization breast biopsies. 2. Laparoscopic cholecystectomy.  SURGEON:  Stephenie Acres, M.D.  ASSISTANT:  Donnie Coffin. Samuella Cota, M.D.  ANESTHESIA:  General.  DESCRIPTION OF PROCEDURE:  The patient was taken to the operating room after having bilateral ultrasound guided placement of wires.  After general anesthesia was induced, the abdomen and bilateral breasts were prepped and draped in normal sterile fashion.  Beginning on the left breast, a curvilinear incision was made over the point marked by the radiologist, dissecting down to an area 1 cm below and in all directions surrounding the reinforced segment of the wire.  The specimen could be palpated and was sent for pathologic evaluation.  Adequate hemostasis was ensured and the skin was closed with subcuticular 4-0 Monocryl.  I then turned my attention to the right breast.  The area previously marked by the radiologist was incised, dissecting down to an area in all directions around the wire.  This came out in two separate segments.  Ultrasound verified the presence of the specimen.  Adequate hemostasis was ensured and the skin was closed with subcuticular 4-0 Monocryl.  Steri-Strips and sterile dressing was applied.  I then turned my attention to the abdomen.  A transverse incision was made in the infraumbilical area,  dissecting down to the fascia which was opened vertically.  A 0 Vicryl pursestring suture was placed around the fascial defect.  The abdomen was then insufflated to 15 mmHg with continuous flow carbon dioxide.  Under direct visualization, a 10 mm port was placed in the subxiphoid region and two 5 mm ports were placed in the right abdomen.  The gallbladder was identified and retracted cephalad.  The dissection began in the infundibulum of the gallbladder identifying the cystic duct which measured about 1 to 2 mm.  It was dissected free of surrounding structures.  Its junction with the common duct and the gallbladder were identified.  The cystic duct was then triply clipped and divided.  The cystic artery was dissected free, isolated, triply clipped and divided as well.  The gallbladder was then placed in an endocatch bag and removed through the umbilical port.  Adequate hemostasis was ensured.  All incisions were injected using Marcaine and the abdomen was allowed to deflate.  Skin incisions were closed with staples.  The patient tolerated the procedure well and went to PACU in good condition.  The patient tolerated the procedure well and went to PACU in good condition. Dictated by:   Earna Coder, M.D. Attending Physician:  Danna Hefty R. DD:  06/01/01 TD:  06/02/01 Job: [id number]

## 2010-12-21 NOTE — Op Note (Signed)
[name], [name]             ACCOUNT NO.:  [id number]   MEDICAL RECORD NO.:  [id number]          PATIENT TYPE:  OIB   LOCATION:  2899                         FACILITY:  [facility]   PHYSICIAN:  Lorre Munroe., M.D.DATE OF BIRTH:  13-Feb-1956   DATE OF PROCEDURE:  06/13/2004  DATE OF DISCHARGE:                                 OPERATIVE REPORT   PREOPERATIVE DIAGNOSIS:  Carcinoma of the right breast.   PROCEDURE:  1.  Sentinel lymph node biopsy, right axilla, following injection of blue      dye.  2.  Right partial mastectomy.   SURGEON:  Lebron Conners, M.D.   ANESTHESIA:  General.   DESCRIPTION OF PROCEDURE:  After the patient was monitored and anesthetized,  I injected about 4 mL of Lymphazurin blue dye into the area just over the  right breast tumor and an area under the right axilla.  We massaged that for  about five minutes.  After routine preparation and draping of the right  breast and axilla, I used the Neoprobe to locate a very radioactive area in  the lower part of the right axilla.  I dissected down toward that area  through a small incision and identified a very large lymph node which I  excised and found it to have a great deal of radioactivity associated with  it and that it also contained the blue dye.  I found another smaller lymph  node adjacent to it which also was radioactive and blue.  I sent them both  for touch prep examination and Berneta Levins, M.D. returned the diagnosis that  no malignant cells were identified.  Utilizing the Neoprobe, I found one  other slightly radioactive node more cephalad to that and I reserved that  for permanent section examination.  I closed the wound of the axilla with a  deep suture layer of 3-0 Vicryl and skin layer of intracuticular 4-0 Vicryl  and Dermabond adhesive.   I made a circumareolar incision just at the lateral aspect of the areola and  deepened that through the skin to the subcutaneous tissues of the breast.  The tumor was quite superficial and as I approached, I noted that I was  probably getting a little too close to it and so I went back and took more  superficial tissue just under the incision on each side, both medial and  lateral, and then developed skin and subcutaneous flaps medially, laterally,  cephalad, and caudad for several cm in each direction.  I then deepened the  dissection at all of those points and excised the tumor entirely along with  a large margin of normal feeling mammary tissue in all directions.  I marked  that for margins, orienting it for the pathologist and then sent it for  examination.  I noted that the skin edges looked somewhat beat up and I felt  that with that being the case and the fact that the tumor had been quite  superficial, I excised about 1 cm rim of skin and subcutaneous tissue on  each side of the central part of the incision tapering  it toward the ends.  Skin edges had  good bleeding and I felt then that I had good margins superficially.  I then  got good hemostasis in the depths of the wound.  I closed the skin with  intracuticular 4-0 Vicryl and Dermabond.  Needle, sponge, and instrument  counts correct.  We applied light bandages.  She tolerated the procedure  well.       WB/MEDQ  D:  06/13/2004  T:  06/13/2004  Job:  [id number]   cc:   Avelino Leeds  [address]  [place]  [place] [address]  Fax: [phone]

## 2010-12-21 NOTE — Assessment & Plan Note (Signed)
[name]                          CHRONIC HEART FAILURE NOTE   [name], [name]                    MRN:          [id number]  DATE:10/23/2006                            DOB:          07-01-1956    [name] returns today for followup of her congestive heart failure  which is secondary to nonischemic cardiomyopathy associated with  chemotherapeutic agents with which she was treated for breast cancer in  2006.  Unfortunately, [name]' breast cancer returned.  She is  status post recent right mastectomy.  She initially did well with her  surgery and had no complications.  However, over the last two weeks has  experienced increased redness and tenderness around the right mastectomy  site, apparently was diagnosed with an infection by Dr. Maple Hudson and is  being treated with antibiotics.  Otherwise she states she is doing okay,  fatigues very easily.  Denies any shortness of breath or dyspnea on  exertion.  She is trying to prepare herself for restarting chemotherapy  for the returning breast cancer.  She also states she has been having to  use the inhaler more for asthma, has experienced some increased  wheezing.  However, denies any orthopnea or PND, no complaints to  suggest any volume overload.  [name] also states her CBGs have been  running higher than normal for her with average greater than 200 in the  mornings.  Note: This is in the setting of increased inhaler use and  increased infection.   PAST MEDICAL HISTORY:  1. Congestive heart failure secondary to nonischemic cardiomyopathy      with EF of 20% by cardiac catheterization in 2006 with improvement      of ejection fraction to 45-50% by tissue Doppler in 2006 as a      participant in the COMPARE study, status post stress Myoview in      December 2007 showing EF of 40% with no evidence of ischemia.  2. Nonobstructive coronary artery disease by catheterization in July      2006.  3. Recent diagnosis of breast cancer status post right mastectomy, now      with surgical infection.  4. Hypertension.  5. History of tobacco use.  6. History of breast cancer status post chemotherapy in 2006.  7. Osteoarthritis.  8. Anxiety and depression.  9. Hypothyroidism.  10.Asthma with increased use of inhaler recently.  11.Obstructive sleep apnea with CPAP use.  12.Diabetes.  13.GERD.   ALLERGIES:  ASPIRIN, CIPRO, SULFA, CODEINE, and NAPROSYN.   REVIEW OF SYSTEMS:  As stated above.   CURRENT MEDICATIONS:  1. Levothyroxine 50 mcg daily.  2. Januvia 100 mg daily.  3. Proventil inhaler p.r.n.  4. Prilosec 40 mg daily.  5. Darvocet p.r.n.  6. Insulin as directed.  7. KCl 40 mEq 3 times a day.  8. BuSpar 30 mg b.i.d.  9. Furosemide 40 mg b.i.d.  10.Altace 7.5 mg daily.  11.Antibiotic of which the patient is not sure of the name and which      she just started yesterday.   PHYSICAL EXAMINATION:  VITAL SIGNS: Weight 215  pounds.  Weight is up 4  pounds.  Blood pressure 115/70 with a pulse of 91.  GENERAL:  [name] is in no acute distress.  NECK:  No jugular venous distention noted at 45 degree angle.  LUNGS:  Clear to auscultation.  I do not hear any wheezing at this time.  CARDIOVASCULAR:  Exam reveals an S1 and S2, regular rate and rhythm.  ABDOMEN: Soft, nontender.  Positive bowel sounds.  CHEST:  The patient has a ABD pad to the right mastectomy site.  EXTREMITIES:  Lower extremities without clubbing, cyanosis, or edema.   IMPRESSION:  Stable Class II heart failure at this time with ejection  fraction currently 47% via Stress Myoview.  Patient pending further  chemotherapy treatment for returning breast cancer.  I am going to  continue [name]'s medications today as she is taking them.  We will  check a BMET and BNP level, the results of which I have requested be  faxed to Dr. Maple Hudson and also to Dr. Erasmo Downer.  Will monitor patient  closely throughout her  chemotherapy in regards to her cardiomyopathy.      Dorian Pod, ACNP  Electronically Signed      Rollene Rotunda, MD, [name]  Electronically Signed   MB/MedQ  DD: 10/23/2006  DT: 10/23/2006  Job #: [id number]   cc:   Dr. Manley Mason, M.D.

## 2011-01-15 ENCOUNTER — Other Ambulatory Visit: Payer: Self-pay | Admitting: Oncology

## 2011-01-15 ENCOUNTER — Encounter (HOSPITAL_BASED_OUTPATIENT_CLINIC_OR_DEPARTMENT_OTHER): Payer: Medicare Other | Admitting: Oncology

## 2011-01-15 DIAGNOSIS — Z171 Estrogen receptor negative status [ER-]: Secondary | ICD-10-CM

## 2011-01-15 DIAGNOSIS — C50419 Malignant neoplasm of upper-outer quadrant of unspecified female breast: Secondary | ICD-10-CM

## 2011-01-15 LAB — COMPREHENSIVE METABOLIC PANEL
AST: 16 U/L (ref 0–37)
Albumin: 4.2 g/dL (ref 3.5–5.2)
BUN: 16 mg/dL (ref 6–23)
CO2: 28 mEq/L (ref 19–32)
Calcium: 9.9 mg/dL (ref 8.4–10.5)
Chloride: 95 mEq/L — ABNORMAL LOW (ref 96–112)
Glucose, Bld: 197 mg/dL — ABNORMAL HIGH (ref 70–99)
Potassium: 4.2 mEq/L (ref 3.5–5.3)

## 2011-01-15 LAB — VITAMIN D 25 HYDROXY (VIT D DEFICIENCY, FRACTURES): Vit D, 25-Hydroxy: 26 ng/mL — ABNORMAL LOW (ref 30–89)

## 2011-01-15 LAB — CBC WITH DIFFERENTIAL/PLATELET
Basophils Absolute: 0.1 10*3/uL (ref 0.0–0.1)
EOS%: 2.2 % (ref 0.0–7.0)
Eosinophils Absolute: 0.2 10*3/uL (ref 0.0–0.5)
HCT: 36.7 % (ref 34.8–46.6)
HGB: 12.4 g/dL (ref 11.6–15.9)
MONO#: 0.7 10*3/uL (ref 0.1–0.9)
NEUT#: 6 10*3/uL (ref 1.5–6.5)
RDW: 16.1 % — ABNORMAL HIGH (ref 11.2–14.5)
WBC: 9.2 10*3/uL (ref 3.9–10.3)
lymph#: 2.3 10*3/uL (ref 0.9–3.3)

## 2011-01-15 LAB — LACTATE DEHYDROGENASE: LDH: 129 U/L (ref 94–250)

## 2011-01-22 ENCOUNTER — Encounter (HOSPITAL_BASED_OUTPATIENT_CLINIC_OR_DEPARTMENT_OTHER): Payer: Medicare Other | Admitting: Oncology

## 2011-01-22 DIAGNOSIS — Z853 Personal history of malignant neoplasm of breast: Secondary | ICD-10-CM

## 2011-03-04 ENCOUNTER — Telehealth: Payer: Self-pay | Admitting: *Deleted

## 2011-03-04 NOTE — Telephone Encounter (Signed)
Per MD, pt is due for OV. Appointment scheduled 03/26/2011 10:45am.

## 2011-03-25 ENCOUNTER — Emergency Department (HOSPITAL_COMMUNITY)
Admission: EM | Admit: 2011-03-25 | Discharge: 2011-03-25 | Disposition: A | Discharge status: Home / Self Care | Payer: Medicare Other | Attending: Emergency Medicine | Admitting: Emergency Medicine

## 2011-03-25 DIAGNOSIS — I10 Essential (primary) hypertension: Secondary | ICD-10-CM | POA: Insufficient documentation

## 2011-03-25 DIAGNOSIS — I509 Heart failure, unspecified: Secondary | ICD-10-CM | POA: Insufficient documentation

## 2011-03-25 DIAGNOSIS — E039 Hypothyroidism, unspecified: Secondary | ICD-10-CM | POA: Insufficient documentation

## 2011-03-25 DIAGNOSIS — E119 Type 2 diabetes mellitus without complications: Secondary | ICD-10-CM | POA: Insufficient documentation

## 2011-03-25 DIAGNOSIS — Z853 Personal history of malignant neoplasm of breast: Secondary | ICD-10-CM | POA: Insufficient documentation

## 2011-03-25 DIAGNOSIS — E860 Dehydration: Secondary | ICD-10-CM | POA: Insufficient documentation

## 2011-03-25 DIAGNOSIS — Z794 Long term (current) use of insulin: Secondary | ICD-10-CM | POA: Insufficient documentation

## 2011-03-25 DIAGNOSIS — Z79899 Other long term (current) drug therapy: Secondary | ICD-10-CM | POA: Insufficient documentation

## 2011-03-25 DIAGNOSIS — I959 Hypotension, unspecified: Secondary | ICD-10-CM | POA: Insufficient documentation

## 2011-03-25 DIAGNOSIS — R197 Diarrhea, unspecified: Secondary | ICD-10-CM | POA: Insufficient documentation

## 2011-03-25 LAB — CBC
MCHC: 33.8 g/dL (ref 30.0–36.0)
RDW: 16.1 % — ABNORMAL HIGH (ref 11.5–15.5)

## 2011-03-25 LAB — COMPREHENSIVE METABOLIC PANEL
ALT: 30 U/L (ref 0–35)
AST: 44 U/L — ABNORMAL HIGH (ref 0–37)
Albumin: 3.7 g/dL (ref 3.5–5.2)
Calcium: 10.4 mg/dL (ref 8.4–10.5)
Sodium: 129 mEq/L — ABNORMAL LOW (ref 135–145)
Total Protein: 7.9 g/dL (ref 6.0–8.3)

## 2011-03-25 LAB — DIFFERENTIAL
Eosinophils Absolute: 0.2 10*3/uL (ref 0.0–0.7)
Eosinophils Relative: 3 % (ref 0–5)
Lymphocytes Relative: 21 % (ref 12–46)
Lymphs Abs: 1.9 10*3/uL (ref 0.7–4.0)
Monocytes Absolute: 1.2 10*3/uL — ABNORMAL HIGH (ref 0.1–1.0)
Monocytes Relative: 13 % — ABNORMAL HIGH (ref 3–12)

## 2011-03-25 LAB — DIGOXIN LEVEL: Digoxin Level: 0.8 ng/mL (ref 0.8–2.0)

## 2011-03-26 ENCOUNTER — Ambulatory Visit: Payer: Medicare Other | Admitting: Endocrinology

## 2011-04-03 ENCOUNTER — Other Ambulatory Visit: Payer: Self-pay | Admitting: *Deleted

## 2011-04-03 MED ORDER — POTASSIUM CHLORIDE CRYS ER 20 MEQ PO TBCR: 60.0000 meq | EXTENDED_RELEASE_TABLET | Freq: Two times a day (BID) | ORAL | Status: DC

## 2011-04-11 ENCOUNTER — Ambulatory Visit (INDEPENDENT_AMBULATORY_CARE_PROVIDER_SITE_OTHER): Payer: Medicare Other | Admitting: Endocrinology

## 2011-04-11 ENCOUNTER — Encounter: Payer: Self-pay | Admitting: Endocrinology

## 2011-04-11 ENCOUNTER — Other Ambulatory Visit (INDEPENDENT_AMBULATORY_CARE_PROVIDER_SITE_OTHER): Payer: Medicare Other

## 2011-04-11 VITALS — BP 82/60 | HR 94 | Temp 98.7°F | Ht 65.0 in | Wt 224.0 lb

## 2011-04-11 DIAGNOSIS — E119 Type 2 diabetes mellitus without complications: Secondary | ICD-10-CM

## 2011-04-11 MED ORDER — INSULIN LISPRO PROT & LISPRO (75-25 MIX) 100 UNIT/ML ~~LOC~~ SUSP: 250.0000 [IU] | Freq: Two times a day (BID) | SUBCUTANEOUS | Status: DC

## 2011-04-11 NOTE — Progress Notes (Signed)
Subjective:    Patient ID: [name], female    DOB: [birth year], 56 y.o.   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  pt states she feels well in general, except for a recent gi illness.  no cbg record, but states cbg's are "high," sometimes due to forgetting insulin injection.  She says "i cringe due to pain of the injections."  She has 1 week of slight crampy pain in the abdomen, and assoc n/v/d.  She feels better now.   Past Medical History  Diagnosis Date  . 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 DYSLIPIDEMIA 05/01/2009  . OBESITY 07/24/2007  . OBSTRUCTIVE SLEEP APNEA 07/24/2007    with CPAP compliance  . RESTLESS LEG SYNDROME 07/24/2007  . SYSTOLIC HEART FAILURE, CHRONIC 01/05/2009  . TACHYCARDIA 10/25/2008  . Palpitations 09/20/2010  . 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 Hypertension   . 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 Previous EF 25%. ECHO 6/08 EF 55% ECHO 5/10 EF 30-35% MRI 7/10 EF 45% Myoview 10/11 59% no ischemia  . History of breast cancer 2006    with return in 2008  . Osteoarthritis   . Anxiety   . Depression     followed by a psychiatrist  . Asthma   . GERD (gastroesophageal reflux disease)   . Gastroparesis   . IBS (irritable bowel syndrome)     Past Surgical History  Procedure Date  . Mastectomy     right    History   Social History  . Marital Status: Divorced    Spouse Name: N/A    Number of Children: N/A  . Years of Education: N/A   Occupational History  . Disabled    Social History Main Topics  . Smoking status: Former Games developer  . Smokeless tobacco: Former Neurosurgeon    Quit date: 03/05/2001  . Alcohol Use: No  . Drug Use: No  . Sexually Active: Not on file   Other Topics Concern  . Not on file   Social History Narrative   Lives with motherRegular exercise-no    Current Outpatient Prescriptions on File Prior to  Visit  Medication Sig Dispense Refill  . acetaminophen (TYLENOL ARTHRITIS PAIN) 650 MG CR tablet 2 tablets every 6 hours as needed       . albuterol (PROVENTIL HFA) 108 (90 BASE) MCG/ACT inhaler Inhale 2 puffs into the lungs 4 (four) times daily.        Marland Kitchen atorvastatin (LIPITOR) 40 MG tablet Take 40 mg by mouth daily.        . busPIRone (BUSPAR) 30 MG tablet Take 30 mg by mouth 2 (two) times daily.        . carvedilol (COREG) 25 MG tablet Take 25 mg by mouth 2 (two) times daily with a meal.        . citalopram (CELEXA) 20 MG tablet 2 tablets by mouth once daily       . cyclobenzaprine (FLEXERIL) 10 MG tablet Take 10 mg by mouth 2 (two) times daily as needed.        . digoxin (LANOXIN) 0.125 MG tablet Take 125 mcg by mouth daily.        . diphenhydramine-acetaminophen (TYLENOL PM) 25-500 MG TABS Take 1 tablet by mouth at bedtime as needed.        . furosemide (LASIX) 20 MG tablet 2 tablets by  mouth two times a day, make take extra tab if needed for edema       . Glucosamine 500 MG CAPS Take 1 capsule by mouth daily.        . Insulin Pen Needle (B-D ULTRAFINE III SHORT PEN) 31G X 8 MM MISC 18/day       . levothyroxine (SYNTHROID, LEVOTHROID) 75 MCG tablet Take 75 mcg by mouth daily.        Marland Kitchen loperamide (IMODIUM A-D) 2 MG tablet Take 2 mg by mouth as needed.        . mometasone (NASONEX) 50 MCG/ACT nasal spray Place 2 sprays into the nose daily.        . nitroGLYCERIN (NITROSTAT) 0.4 MG SL tablet Place 0.4 mg under the tongue as needed.        Marland Kitchen omega-3 acid ethyl esters (LOVAZA) 1 G capsule Take 4 capsules by mouth daily.        . pantoprazole (PROTONIX) 40 MG tablet Take 40 mg by mouth 2 (two) times daily.        . potassium chloride SA (K-DUR,KLOR-CON) 20 MEQ tablet Take 3 tablets (60 mEq total) by mouth 2 (two) times daily.  180 tablet  2  . ramipril (ALTACE) 5 MG tablet Take 5 mg by mouth 2 (two) times daily.        Marland Kitchen rOPINIRole (REQUIP) 1 MG tablet Take 1 mg by mouth at bedtime.        Marland Kitchen  spironolactone (ALDACTONE) 25 MG tablet Take 1 tablet (25 mg total) by mouth daily.  30 tablet  11  . traZODone (DESYREL) 50 MG tablet 2-3 tablets by mouth daily         Allergies  Allergen Reactions  . Aspirin   . Ciprofloxacin   . Codeine   . Naproxen   . Sulfonamide Derivatives     Family History  Problem Relation Age of Onset  . Diabetes Mother   . Hypertension Other   . Coronary artery disease Other     BP 82/60  Pulse 94  Temp(Src) 98.7 F (37.1 C) (Oral)  Ht 5\' 5"  (1.651 m)  Wt 224 lb (101.606 kg)  BMI 37.28 kg/m2  SpO2 94%    Review of Systems She has hypoglycemia in the afternoon, approx 1/ month.  No dizziness    Objective:   Physical Exam VITAL SIGNS:  See vs page GENERAL: no distress.  obese Pulses: dorsalis pedis intact bilat.   Feet: no deformity.  no ulcer on the feet.  feet are of normal color and temp.  no edema Neuro: sensation is intact to touch on the feet.       Assessment & Plan:  Dm.  She may do better with a simpler regimen viral ge, better htn is overcontrolled due to the illness.

## 2011-04-11 NOTE — Patient Instructions (Addendum)
For now, stop the furosemide, ramipril, and spironolactone. Please see amy moon, np next week to recheck your blood prssure. Change both insulins to: humalog mix 75/25, 250 units 2x a day (with first and last meals of the day). Take it easy for the net few days, and drink plenty of fluids for now.   Please make a follow-up appointment in 1 month. blood tests are being requested for you today.  please call [phone] to hear your test results.  You will be prompted to enter the 9-digit "MRN" number that appears at the top left of this page, followed by #.  Then you will hear the message. check your blood sugar 2 times a day.  vary the time of day when you check, between before the 3 meals, and at bedtime.  also check if you have symptoms of your blood sugar being too high or too low.  please keep a record of the readings and bring it to your next appointment here.  please call us sooner if you are having low blood sugar episodes, or if it stays over 200.

## 2011-04-29 LAB — POCT I-STAT 3, VENOUS BLOOD GAS (G3P V)
Bicarbonate: 23.7
Bicarbonate: 27.4 — ABNORMAL HIGH
O2 Saturation: 66
TCO2: 25
TCO2: 29
pCO2, Ven: 41 — ABNORMAL LOW
pCO2, Ven: 46
pH, Ven: 7.37 — ABNORMAL HIGH
pH, Ven: 7.383 — ABNORMAL HIGH
pO2, Ven: 35
pO2, Ven: 38

## 2011-04-29 LAB — POCT I-STAT 3, ART BLOOD GAS (G3+)
Acid-Base Excess: 2
Bicarbonate: 26.9 — ABNORMAL HIGH
TCO2: 28

## 2011-04-30 ENCOUNTER — Other Ambulatory Visit: Payer: Self-pay | Admitting: *Deleted

## 2011-04-30 MED ORDER — CYCLOBENZAPRINE HCL 10 MG PO TABS: 10.0000 mg | ORAL_TABLET | Freq: Two times a day (BID) | ORAL | Status: DC | PRN

## 2011-04-30 NOTE — Telephone Encounter (Signed)
R'cd fax from [facility] for refill of Flexeril  Last OV-04/11/2011  Last filled-04/03/2011

## 2011-05-10 ENCOUNTER — Ambulatory Visit (HOSPITAL_COMMUNITY)
Admission: RE | Admit: 2011-05-10 | Discharge: 2011-05-10 | Disposition: A | Discharge status: Home / Self Care | Payer: Medicare Other | Source: Ambulatory Visit | Attending: Internal Medicine | Admitting: Internal Medicine

## 2011-05-10 ENCOUNTER — Encounter (HOSPITAL_COMMUNITY): Payer: Self-pay

## 2011-05-10 DIAGNOSIS — I5022 Chronic systolic (congestive) heart failure: Secondary | ICD-10-CM | POA: Insufficient documentation

## 2011-05-10 MED ORDER — RAMIPRIL 1.25 MG PO CAPS: 2.5000 mg | ORAL_CAPSULE | Freq: Two times a day (BID) | ORAL | Status: DC

## 2011-05-10 NOTE — Patient Instructions (Signed)
Please take Ramipril 2.5 mg po twice daily  Please follow up in two weeks at [facility] or your primary care doctor for a BMET  Stop taking Ramipril if you are dizzy and call the heart failure clinic [phone]  Please follow up in two months at the Heart Failure Clinic

## 2011-05-10 NOTE — Assessment & Plan Note (Addendum)
NYHA II.  EF back to normal on previous imaging. Volume status stable. She has not been taking ACE inhibitor for last couple of months. Will start low dose Ramipril 2.5 mg BID. She is instructed to stop taking Ramipril she is becomes dizzy. She will need BMET in two weeks. She plans to ask her PCP to complete BMET.  She encouraged to start walking or try water aerobics daily. Due for f/u echo.   Patient seen and examined with Tonye Becket, NP. We discussed all aspects of the encounter. I agree with the assessment and plan as stated above.

## 2011-05-10 NOTE — Progress Notes (Signed)
HPI: 55 year old female with history of breast cancer, obesity, HTN, diabetes, IBS, and OSA. She had chemo induced cardiomyopathy. Cardiac MRI in July 2010 EF 45%. Myoview in 10/11 EF 59%. No ischemia.   She stopped ace inhibitor while hospitalized at [facility]  In August 2012. She was admitted for dehydration. She followed up with her  primary care physician and ACE remained stopped.  Weight at home 233-234. She weighs twice a week. BP at home 100-110. SOB on exertion. She does complain of occasional dizziness She uses a cane to ambulate. She has taken one  extra lasix  August.    ROS: All systems negative except as listed in HPI, PMH and Problem List.  Past Medical History  Diagnosis Date  . 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 DYSLIPIDEMIA 05/01/2009  . OBESITY 07/24/2007  . OBSTRUCTIVE SLEEP APNEA 07/24/2007    with CPAP compliance  . RESTLESS LEG SYNDROME 07/24/2007  . SYSTOLIC HEART FAILURE, CHRONIC 01/05/2009  . TACHYCARDIA 10/25/2008  . Palpitations 09/20/2010  . 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 Hypertension   . 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 Previous EF 25%. ECHO 6/08 EF 55% ECHO 5/10 EF 30-35% MRI 7/10 EF 45% Myoview 10/11 59% no ischemia  . History of breast cancer 2006    with return in 2008  . Osteoarthritis   . Anxiety   . Depression     followed by a psychiatrist  . Asthma   . GERD (gastroesophageal reflux disease)   . Gastroparesis   . IBS (irritable bowel syndrome)     Current Outpatient Prescriptions  Medication Sig Dispense Refill  . acetaminophen (TYLENOL ARTHRITIS PAIN) 650 MG CR tablet 2 tablets every 6 hours as needed       . albuterol (PROVENTIL HFA) 108 (90 BASE) MCG/ACT inhaler Inhale 2 puffs into the lungs 4 (four) times daily.        Marland Kitchen atorvastatin (LIPITOR) 40 MG tablet Take 40 mg by mouth daily.        . busPIRone (BUSPAR) 30 MG tablet Take 30 mg by mouth 2 (two) times daily.          . carvedilol (COREG) 25 MG tablet Take 25 mg by mouth 2 (two) times daily with a meal.        . citalopram (CELEXA) 20 MG tablet 2 tablets by mouth once daily       . cyclobenzaprine (FLEXERIL) 10 MG tablet Take 1 tablet (10 mg total) by mouth 2 (two) times daily as needed.  60 tablet  5  . digoxin (LANOXIN) 0.125 MG tablet Take 125 mcg by mouth daily.        . diphenhydramine-acetaminophen (TYLENOL PM) 25-500 MG TABS Take 1 tablet by mouth at bedtime as needed.        . furosemide (LASIX) 20 MG tablet 2 tablets by mouth two times a day, make take extra tab if needed for edema       . Glucosamine 500 MG CAPS Take 1 capsule by mouth daily.        . insulin lispro protamine-insulin lispro (HUMALOG MIX 75/25 KWIKPEN) (75-25) 100 UNIT/ML SUSP Inject 250 Units into the skin 2 (two) times daily with a meal. (with first and last meals of the day).  180 mL  12  . Insulin Pen Needle (B-D ULTRAFINE III SHORT PEN) 31G X 8 MM MISC 18/day       .  levothyroxine (SYNTHROID, LEVOTHROID) 75 MCG tablet Take 75 mcg by mouth daily.        Marland Kitchen loperamide (IMODIUM A-D) 2 MG tablet Take 2 mg by mouth as needed.        . mometasone (NASONEX) 50 MCG/ACT nasal spray Place 2 sprays into the nose daily.        . nitroGLYCERIN (NITROSTAT) 0.4 MG SL tablet Place 0.4 mg under the tongue as needed.        Marland Kitchen omega-3 acid ethyl esters (LOVAZA) 1 G capsule Take 4 capsules by mouth daily.        . pantoprazole (PROTONIX) 40 MG tablet Take 40 mg by mouth 2 (two) times daily.        . potassium chloride SA (K-DUR,KLOR-CON) 20 MEQ tablet Take 3 tablets (60 mEq total) by mouth 2 (two) times daily.  180 tablet  2  . ramipril (ALTACE) 5 MG tablet Take 5 mg by mouth 2 (two) times daily.        Marland Kitchen rOPINIRole (REQUIP) 1 MG tablet Take 1 mg by mouth at bedtime.        Marland Kitchen spironolactone (ALDACTONE) 25 MG tablet Take 1 tablet (25 mg total) by mouth daily.  30 tablet  11  . traZODone (DESYREL) 50 MG tablet 2-3 tablets by mouth daily           PHYSICAL EXAM: Filed Vitals:   05/10/11 1213  BP: 111/68  Pulse: 84   General:  Well appearing. No resp difficulty HEENT: normal Neck: supple. JVP flat. Carotids 2+ bilaterally; no bruits. No lymphadenopathy or thryomegaly appreciated. Cor: PMI normal. Regular rate & rhythm. No rubs, gallops or murmurs. Lungs: clear Abdomen: soft, obese, nontender,distended No hepatosplenomegaly. No bruits or masses. Good bowel sounds. Extremities: no cyanosis, clubbing, rash, edema Neuro: alert & orientedx3, cranial nerves grossly intact. Moves all 4 extremities w/o difficulty. Affect pleasant.    ECG:   ASSESSMENT & PLAN:

## 2011-05-15 ENCOUNTER — Other Ambulatory Visit: Payer: Self-pay | Admitting: *Deleted

## 2011-05-15 MED ORDER — DIGOXIN 125 MCG PO TABS: 125.0000 ug | ORAL_TABLET | Freq: Every day | ORAL | Status: DC

## 2011-05-16 ENCOUNTER — Ambulatory Visit (INDEPENDENT_AMBULATORY_CARE_PROVIDER_SITE_OTHER): Payer: Medicare Other | Admitting: Endocrinology

## 2011-05-16 ENCOUNTER — Encounter: Payer: Self-pay | Admitting: Endocrinology

## 2011-05-16 DIAGNOSIS — E119 Type 2 diabetes mellitus without complications: Secondary | ICD-10-CM

## 2011-05-16 NOTE — Progress Notes (Signed)
Subjective:    Patient ID: [name], female    DOB: 1955-08-23, 55 y.o.   MRN: [id number]  HPI Pt says she forgot 3 insulin doses since last ov.  no cbg record, but states cbg's are sometimes low after breakfast insulin.  pt states she feels well in general. Past Medical History  Diagnosis Date  . 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 DYSLIPIDEMIA 05/01/2009  . OBESITY 07/24/2007  . OBSTRUCTIVE SLEEP APNEA 07/24/2007    with CPAP compliance  . RESTLESS LEG SYNDROME 07/24/2007  . SYSTOLIC HEART FAILURE, CHRONIC 01/05/2009  . TACHYCARDIA 10/25/2008  . Palpitations 09/20/2010  . 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 Hypertension   . 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 Previous EF 25%. ECHO 6/08 EF 55% ECHO 5/10 EF 30-35% MRI 7/10 EF 45% Myoview 10/11 59% no ischemia  . History of breast cancer 2006    with return in 2008  . Osteoarthritis   . Anxiety   . Depression     followed by a psychiatrist  . Asthma   . GERD (gastroesophageal reflux disease)   . Gastroparesis   . IBS (irritable bowel syndrome)     Past Surgical History  Procedure Date  . Mastectomy     right  . Abdominal hysterectomy 1991    History   Social History  . Marital Status: Divorced    Spouse Name: N/A    Number of Children: N/A  . Years of Education: N/A   Occupational History  . Disabled    Social History Main Topics  . Smoking status: Former Games developer  . Smokeless tobacco: Former Neurosurgeon    Quit date: 03/05/2001  . Alcohol Use: No  . Drug Use: No  . Sexually Active: Not on file   Other Topics Concern  . Not on file   Social History Narrative   Lives with motherRegular exercise-no    Current Outpatient Prescriptions on File Prior to Visit  Medication Sig Dispense Refill  . acetaminophen (TYLENOL ARTHRITIS PAIN) 650 MG CR tablet 2 tablets every 6 hours as needed       . albuterol (PROVENTIL HFA) 108 (90 BASE) MCG/ACT  inhaler Inhale 2 puffs into the lungs 4 (four) times daily.        Marland Kitchen atorvastatin (LIPITOR) 40 MG tablet Take 40 mg by mouth daily.        . busPIRone (BUSPAR) 30 MG tablet Take 30 mg by mouth 2 (two) times daily.        . carvedilol (COREG) 25 MG tablet Take 25 mg by mouth 2 (two) times daily with a meal.        . citalopram (CELEXA) 20 MG tablet 2 tablets by mouth once daily       . cyclobenzaprine (FLEXERIL) 10 MG tablet Take 1 tablet (10 mg total) by mouth 2 (two) times daily as needed.  60 tablet  5  . digoxin (LANOXIN) 0.125 MG tablet Take 1 tablet (125 mcg total) by mouth daily.  30 tablet  2  . diphenhydramine-acetaminophen (TYLENOL PM) 25-500 MG TABS Take 1 tablet by mouth at bedtime as needed.        . furosemide (LASIX) 20 MG tablet 2 tablets by mouth two times a day, make take extra tab if needed for edema       . Glucosamine 500 MG CAPS Take 1 capsule by mouth daily.        Marland Kitchen  Insulin Pen Needle (B-D ULTRAFINE III SHORT PEN) 31G X 8 MM MISC 18/day       . levothyroxine (SYNTHROID, LEVOTHROID) 75 MCG tablet Take 75 mcg by mouth daily.        Marland Kitchen loperamide (IMODIUM A-D) 2 MG tablet Take 2 mg by mouth as needed.        . mometasone (NASONEX) 50 MCG/ACT nasal spray Place 2 sprays into the nose daily.        . nitroGLYCERIN (NITROSTAT) 0.4 MG SL tablet Place 0.4 mg under the tongue as needed.        Marland Kitchen omega-3 acid ethyl esters (LOVAZA) 1 G capsule Take 4 capsules by mouth daily.        . pantoprazole (PROTONIX) 40 MG tablet Take 40 mg by mouth 2 (two) times daily.        . potassium chloride SA (K-DUR,KLOR-CON) 20 MEQ tablet Take 3 tablets (60 mEq total) by mouth 2 (two) times daily.  180 tablet  2  . rOPINIRole (REQUIP) 1 MG tablet Take 1 mg by mouth at bedtime.        Marland Kitchen spironolactone (ALDACTONE) 25 MG tablet Take 1 tablet (25 mg total) by mouth daily.  30 tablet  11  . traMADol (ULTRAM) 50 MG tablet Take 50 mg by mouth every 6 (six) hours as needed.        . traZODone (DESYREL) 50 MG  tablet 2-3 tablets by mouth daily        Allergies  Allergen Reactions  . Aspirin   . Ciprofloxacin   . Codeine   . Naproxen   . Sulfonamide Derivatives    Family History  Problem Relation Age of Onset  . Diabetes Mother   . Hypertension Other   . Coronary artery disease Other     BP 112/62  Pulse 110  Temp(Src) 97.3 F (36.3 C) (Oral)  Ht 5\' 5"  (1.651 m)  Wt 223 lb 9 oz (101.407 kg)  BMI 37.20 kg/m2  SpO2 98%  Review of Systems Denies loc    Objective:   Physical Exam VITAL SIGNS:  See vs page GENERAL: no distress.  Obese SKIN:  Insulin injection sites at the anterior abdomen are normal      Assessment & Plan:  Dm, apparently overcontrolled

## 2011-05-16 NOTE — Patient Instructions (Addendum)
Decrease humalog mix to 75/25, 225 units 2x a day (with first and last meals of the day). Please make a follow-up appointment in 1 month. check your blood sugar 2 times a day.  vary the time of day when you check, between before the 3 meals, and at bedtime.  also check if you have symptoms of your blood sugar being too high or too low.  please keep a record of the readings and bring it to your next appointment here.  please call us sooner if you are having low blood sugar episodes, or if it stays over 200. It is critically important to remember your insulin shots.

## 2011-05-21 ENCOUNTER — Emergency Department (HOSPITAL_COMMUNITY)
Admission: EM | Admit: 2011-05-21 | Discharge: 2011-05-21 | Disposition: A | Discharge status: Home / Self Care | Payer: Medicare Other | Attending: Emergency Medicine | Admitting: Emergency Medicine

## 2011-05-21 ENCOUNTER — Emergency Department (HOSPITAL_COMMUNITY): Payer: Medicare Other

## 2011-05-21 DIAGNOSIS — I10 Essential (primary) hypertension: Secondary | ICD-10-CM | POA: Insufficient documentation

## 2011-05-21 DIAGNOSIS — E119 Type 2 diabetes mellitus without complications: Secondary | ICD-10-CM | POA: Insufficient documentation

## 2011-05-21 DIAGNOSIS — K589 Irritable bowel syndrome without diarrhea: Secondary | ICD-10-CM | POA: Insufficient documentation

## 2011-05-21 DIAGNOSIS — Z794 Long term (current) use of insulin: Secondary | ICD-10-CM | POA: Insufficient documentation

## 2011-05-21 DIAGNOSIS — R112 Nausea with vomiting, unspecified: Secondary | ICD-10-CM | POA: Insufficient documentation

## 2011-05-21 DIAGNOSIS — R42 Dizziness and giddiness: Secondary | ICD-10-CM | POA: Insufficient documentation

## 2011-05-21 DIAGNOSIS — E039 Hypothyroidism, unspecified: Secondary | ICD-10-CM | POA: Insufficient documentation

## 2011-05-21 DIAGNOSIS — Z79899 Other long term (current) drug therapy: Secondary | ICD-10-CM | POA: Insufficient documentation

## 2011-05-21 DIAGNOSIS — Z853 Personal history of malignant neoplasm of breast: Secondary | ICD-10-CM | POA: Insufficient documentation

## 2011-05-21 DIAGNOSIS — M129 Arthropathy, unspecified: Secondary | ICD-10-CM | POA: Insufficient documentation

## 2011-05-21 DIAGNOSIS — K219 Gastro-esophageal reflux disease without esophagitis: Secondary | ICD-10-CM | POA: Insufficient documentation

## 2011-05-21 DIAGNOSIS — F341 Dysthymic disorder: Secondary | ICD-10-CM | POA: Insufficient documentation

## 2011-05-21 DIAGNOSIS — I509 Heart failure, unspecified: Secondary | ICD-10-CM | POA: Insufficient documentation

## 2011-05-21 LAB — COMPREHENSIVE METABOLIC PANEL
Albumin: 3.8 g/dL (ref 3.5–5.2)
Alkaline Phosphatase: 77 U/L (ref 39–117)
BUN: 13 mg/dL (ref 6–23)
CO2: 29 mEq/L (ref 19–32)
Chloride: 96 mEq/L (ref 96–112)
Creatinine, Ser: 0.64 mg/dL (ref 0.50–1.10)
GFR calc non Af Amer: >90 mL/min (ref >90)
Potassium: 3.9 mEq/L (ref 3.5–5.1)
Total Bilirubin: 0.5 mg/dL (ref 0.3–1.2)

## 2011-05-21 LAB — CBC
HCT: 42.3 % (ref 36.0–46.0)
Hemoglobin: 14.1 g/dL (ref 12.0–15.0)
MCH: 28.6 pg (ref 26.0–34.0)
MCHC: 33.3 g/dL (ref 30.0–36.0)
MCV: 85.8 fL (ref 78.0–100.0)
RBC: 4.93 MIL/uL (ref 3.87–5.11)

## 2011-05-21 LAB — LIPASE, BLOOD: Lipase: 22 U/L (ref 11–59)

## 2011-05-23 ENCOUNTER — Other Ambulatory Visit: Payer: Self-pay | Admitting: Internal Medicine

## 2011-05-23 MED ORDER — CARVEDILOL 25 MG PO TABS: 25.0000 mg | ORAL_TABLET | Freq: Two times a day (BID) | ORAL | Status: DC

## 2011-06-17 ENCOUNTER — Other Ambulatory Visit: Payer: Self-pay | Admitting: Internal Medicine

## 2011-06-17 ENCOUNTER — Ambulatory Visit (INDEPENDENT_AMBULATORY_CARE_PROVIDER_SITE_OTHER): Payer: Medicare Other | Admitting: Endocrinology

## 2011-06-17 ENCOUNTER — Encounter: Payer: Self-pay | Admitting: Endocrinology

## 2011-06-17 DIAGNOSIS — E119 Type 2 diabetes mellitus without complications: Secondary | ICD-10-CM

## 2011-06-17 MED ORDER — NITROGLYCERIN 0.4 MG SL SUBL: 0.4000 mg | SUBLINGUAL_TABLET | SUBLINGUAL | Status: DC | PRN

## 2011-06-17 NOTE — Progress Notes (Signed)
Subjective:    Patient ID: [name], female    DOB: 1956/07/29, 55 y.o.   MRN: [id number]  HPI she brings a record of her cbg's which i have reviewed today.  It varies from 86-300.  There is no trend throughout the day, except that it is lower with exertion.   Past Medical History  Diagnosis Date  . 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 DYSLIPIDEMIA 05/01/2009  . OBESITY 07/24/2007  . OBSTRUCTIVE SLEEP APNEA 07/24/2007    with CPAP compliance  . RESTLESS LEG SYNDROME 07/24/2007  . SYSTOLIC HEART FAILURE, CHRONIC 01/05/2009  . TACHYCARDIA 10/25/2008  . Palpitations 09/20/2010  . 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 Hypertension   . 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 Previous EF 25%. ECHO 6/08 EF 55% ECHO 5/10 EF 30-35% MRI 7/10 EF 45% Myoview 10/11 59% no ischemia  . History of breast cancer 2006    with return in 2008  . Osteoarthritis   . Anxiety   . Depression     followed by a psychiatrist  . Asthma   . GERD (gastroesophageal reflux disease)   . Gastroparesis   . IBS (irritable bowel syndrome)     Past Surgical History  Procedure Date  . Mastectomy     right  . Abdominal hysterectomy 1991    History   Social History  . Marital Status: Divorced    Spouse Name: N/A    Number of Children: N/A  . Years of Education: N/A   Occupational History  . Disabled    Social History Main Topics  . Smoking status: Former Games developer  . Smokeless tobacco: Former Neurosurgeon    Quit date: 03/05/2001  . Alcohol Use: No  . Drug Use: No  . Sexually Active: Not on file   Other Topics Concern  . Not on file   Social History Narrative   Lives with motherRegular exercise-no    Current Outpatient Prescriptions on File Prior to Visit  Medication Sig Dispense Refill  . acetaminophen (TYLENOL ARTHRITIS PAIN) 650 MG CR tablet 2 tablets every 6 hours as needed       . albuterol (PROVENTIL HFA) 108 (90 BASE) MCG/ACT  inhaler Inhale 2 puffs into the lungs 4 (four) times daily.        Marland Kitchen atorvastatin (LIPITOR) 40 MG tablet Take 40 mg by mouth daily.        . busPIRone (BUSPAR) 30 MG tablet Take 30 mg by mouth 2 (two) times daily.        . carvedilol (COREG) 25 MG tablet Take 1 tablet (25 mg total) by mouth 2 (two) times daily with a meal.  60 tablet  2  . citalopram (CELEXA) 20 MG tablet 2 tablets by mouth once daily       . cyclobenzaprine (FLEXERIL) 10 MG tablet Take 1 tablet (10 mg total) by mouth 2 (two) times daily as needed.  60 tablet  5  . digoxin (LANOXIN) 0.125 MG tablet Take 1 tablet (125 mcg total) by mouth daily.  30 tablet  2  . diphenhydramine-acetaminophen (TYLENOL PM) 25-500 MG TABS Take 1 tablet by mouth at bedtime as needed.        . furosemide (LASIX) 20 MG tablet 2 tablets by mouth two times a day, make take extra tab if needed for edema       . Glucosamine 500 MG CAPS Take 1 capsule by mouth daily.        Marland Kitchen  insulin lispro protamine-insulin lispro (HUMALOG 75/25) (75-25) 100 UNIT/ML SUSP 225 units with breakfast, and 250 units with the evening meal.      . Insulin Pen Needle (B-D ULTRAFINE III SHORT PEN) 31G X 8 MM MISC 18/day       . levothyroxine (SYNTHROID, LEVOTHROID) 75 MCG tablet Take 75 mcg by mouth daily.        Marland Kitchen loperamide (IMODIUM A-D) 2 MG tablet Take 2 mg by mouth as needed.        . mometasone (NASONEX) 50 MCG/ACT nasal spray Place 2 sprays into the nose daily.        . nitroGLYCERIN (NITROSTAT) 0.4 MG SL tablet Place 1 tablet (0.4 mg total) under the tongue as needed.  25 tablet  6  . omega-3 acid ethyl esters (LOVAZA) 1 G capsule Take 4 capsules by mouth daily.        . pantoprazole (PROTONIX) 40 MG tablet Take 40 mg by mouth 2 (two) times daily.        . potassium chloride SA (K-DUR,KLOR-CON) 20 MEQ tablet Take 3 tablets (60 mEq total) by mouth 2 (two) times daily.  180 tablet  2  . ramipril (ALTACE) 2.5 MG tablet Take 2.5 mg by mouth 2 (two) times daily.        Marland Kitchen rOPINIRole  (REQUIP) 1 MG tablet Take 1 mg by mouth at bedtime.        Marland Kitchen spironolactone (ALDACTONE) 25 MG tablet Take 1 tablet (25 mg total) by mouth daily.  30 tablet  11  . traMADol (ULTRAM) 50 MG tablet Take 50 mg by mouth every 6 (six) hours as needed.        . traZODone (DESYREL) 50 MG tablet 2-3 tablets by mouth daily         Allergies  Allergen Reactions  . Aspirin   . Ciprofloxacin   . Codeine   . Naproxen   . Sulfonamide Derivatives     Family History  Problem Relation Age of Onset  . Diabetes Mother   . Hypertension Other   . Coronary artery disease Other     BP 126/70  Pulse 101  Temp(Src) 98.1 F (36.7 C) (Oral)  Ht 5\' 5"  (1.651 m)  Wt 232 lb (105.235 kg)  BMI 38.61 kg/m2  SpO2 96%   Review of Systems denies hypoglycemia    Objective:   Physical Exam VITAL SIGNS:  See vs page GENERAL: no distress.  Obese Pulses: dorsalis pedis intact bilat.   Feet: no deformity.  no ulcer on the feet.  feet are of normal color and temp.  no edema Neuro: sensation is intact to touch on the feet .    Assessment & Plan:  Dm, needs increased rx

## 2011-06-17 NOTE — Patient Instructions (Addendum)
change humalog 75/25, to 225 units with breakfast (just take 200 if exertion is anticipated), and 250 unit with the evening meal   Please make a follow-up appointment in 2 months. check your blood sugar 2 times a day.  vary the time of day when you check, between before the 3 meals, and at bedtime.  also check if you have symptoms of your blood sugar being too high or too low.  please keep a record of the readings and bring it to your next appointment here.  please call us sooner if you are having low blood sugar episodes, or if it stays over 200.

## 2011-06-20 ENCOUNTER — Encounter: Payer: Self-pay | Admitting: Internal Medicine

## 2011-07-09 ENCOUNTER — Ambulatory Visit (HOSPITAL_COMMUNITY)
Admission: RE | Admit: 2011-07-09 | Discharge: 2011-07-09 | Disposition: A | Discharge status: Home / Self Care | Payer: Medicare Other | Source: Ambulatory Visit | Attending: Internal Medicine | Admitting: Internal Medicine

## 2011-07-09 VITALS — BP 84/52 | HR 110 | Wt 229.8 lb

## 2011-07-09 DIAGNOSIS — I5022 Chronic systolic (congestive) heart failure: Secondary | ICD-10-CM | POA: Insufficient documentation

## 2011-07-09 NOTE — Progress Notes (Cosign Needed)
Patient ID: [name], female   DOB: 1956/07/10, 55 y.o.   MRN: [id number]  HPI: 55 year old female with history of breast cancer, obesity, HTN, diabetes, IBS, and OSA. She had chemo induced cardiomyopathy. Cardiac MRI in July 2010 EF 45%. Myoview in 10/11 EF 59%. No ischemia.   She is here for follow up. Complains of anxiety due to personal issues with her family. Last visit Ramipril restarted and she has tolerated this well. Denies dizziness/SOB/PND/Orthpnea. SOB on exertion.  Continues to use CPAP at night. She has not weighing or taking BP at home . She has not had any extra Lasix.    ROS: All systems negative except as listed in HPI, PMH and Problem List.  Past Medical History  Diagnosis Date  . 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 DYSLIPIDEMIA 05/01/2009  . OBESITY 07/24/2007  . OBSTRUCTIVE SLEEP APNEA 07/24/2007    with CPAP compliance  . RESTLESS LEG SYNDROME 07/24/2007  . SYSTOLIC HEART FAILURE, CHRONIC 01/05/2009  . TACHYCARDIA 10/25/2008  . Palpitations 09/20/2010  . 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 Hypertension   . 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 Previous EF 25%. ECHO 6/08 EF 55% ECHO 5/10 EF 30-35% MRI 7/10 EF 45% Myoview 10/11 59% no ischemia  . History of breast cancer 2006    with return in 2008  . Osteoarthritis   . Anxiety   . Depression     followed by a psychiatrist  . Asthma   . GERD (gastroesophageal reflux disease)   . Gastroparesis   . IBS (irritable bowel syndrome)     Current Outpatient Prescriptions  Medication Sig Dispense Refill  . acetaminophen (TYLENOL ARTHRITIS PAIN) 650 MG CR tablet 2 tablets every 6 hours as needed       . albuterol (PROVENTIL HFA) 108 (90 BASE) MCG/ACT inhaler Inhale 2 puffs into the lungs 4 (four) times daily.        Marland Kitchen atorvastatin (LIPITOR) 40 MG tablet Take 40 mg by mouth daily.        . busPIRone (BUSPAR) 30 MG tablet Take 30 mg by mouth 2 (two)  times daily.        . carvedilol (COREG) 25 MG tablet Take 1 tablet (25 mg total) by mouth 2 (two) times daily with a meal.  60 tablet  2  . citalopram (CELEXA) 20 MG tablet 2 tablets by mouth once daily       . cyclobenzaprine (FLEXERIL) 10 MG tablet Take 1 tablet (10 mg total) by mouth 2 (two) times daily as needed.  60 tablet  5  . digoxin (LANOXIN) 0.125 MG tablet Take 1 tablet (125 mcg total) by mouth daily.  30 tablet  2  . diphenhydramine-acetaminophen (TYLENOL PM) 25-500 MG TABS Take 1 tablet by mouth at bedtime as needed.        . furosemide (LASIX) 20 MG tablet 2 tablets by mouth two times a day, make take extra tab if needed for edema       . Glucosamine 500 MG CAPS Take 1 capsule by mouth daily.        . insulin lispro protamine-insulin lispro (HUMALOG 75/25) (75-25) 100 UNIT/ML SUSP 225 units with breakfast, and 250 units with the evening meal.      . Insulin Pen Needle (B-D ULTRAFINE III SHORT PEN) 31G X 8 MM MISC 18/day       . levothyroxine (SYNTHROID, LEVOTHROID) 75  MCG tablet Take 75 mcg by mouth daily.        Marland Kitchen loperamide (IMODIUM A-D) 2 MG tablet Take 2 mg by mouth as needed.        . mometasone (NASONEX) 50 MCG/ACT nasal spray Place 2 sprays into the nose daily.        . nitroGLYCERIN (NITROSTAT) 0.4 MG SL tablet Place 1 tablet (0.4 mg total) under the tongue as needed.  25 tablet  6  . omega-3 acid ethyl esters (LOVAZA) 1 G capsule Take 4 capsules by mouth daily.        . pantoprazole (PROTONIX) 40 MG tablet Take 40 mg by mouth 2 (two) times daily.        . potassium chloride SA (K-DUR,KLOR-CON) 20 MEQ tablet Take 3 tablets (60 mEq total) by mouth 2 (two) times daily.  180 tablet  2  . ramipril (ALTACE) 2.5 MG tablet Take 2.5 mg by mouth 2 (two) times daily.        Marland Kitchen rOPINIRole (REQUIP) 1 MG tablet Take 1 mg by mouth at bedtime.        Marland Kitchen spironolactone (ALDACTONE) 25 MG tablet Take 1 tablet (25 mg total) by mouth daily.  30 tablet  11  . traMADol (ULTRAM) 50 MG tablet Take  50 mg by mouth every 6 (six) hours as needed.        . traZODone (DESYREL) 50 MG tablet 2-3 tablets by mouth daily          PHYSICAL EXAM: Filed Vitals:   07/09/11 1130  BP: 84/52  Pulse: 110  Weight 229 (231) General:  Well appearing. No resp difficulty HEENT: normal Neck: supple. JVP 7-8. Carotids 2+ bilaterally; no bruits. No lymphadenopathy or thryomegaly appreciated. Cor: PMI normal. Regular rate & rhythm. No rubs, gallops or murmurs. Lungs: clear Abdomen: soft, obese, nontender,distended No hepatosplenomegaly. No bruits or masses. Good bowel sounds. Extremities: no cyanosis, clubbing, rash, 1+ edema Neuro: alert & orientedx3, cranial nerves grossly intact. Moves all 4 extremities w/o difficulty. Affect pleasant.       ASSESSMENT & PLAN:

## 2011-07-09 NOTE — Assessment & Plan Note (Addendum)
NYHA II-III. Volume status stable. Blood pressure soft will not adjust medications. Re-educated to weigh daily and take an extra Lasix if her weight increases 3 pounds in 24 hours.She will follow up in 3 months.   Patient seen and examined with Tonye Becket, NP. We discussed all aspects of the encounter. I agree with the assessment and plan as stated above. Volume status looks good. I suspect er weight and deconditioning also adding to her symptoms. Counseled on need to continue weight loss efforts. No change in meds for now.

## 2011-07-09 NOTE — Patient Instructions (Signed)
Do the following things EVERYDAY: 1) Weigh yourself in the morning before breakfast. Write it down and keep it in a log. 2) Take your medicines as prescribed 3) Eat low salt foods-Limit salt (sodium) to 2000mg per day.  4) Stay as active as you can everyday  Follow up in 3 months 

## 2011-07-11 ENCOUNTER — Encounter (HOSPITAL_COMMUNITY): Payer: Medicare Other

## 2011-08-01 ENCOUNTER — Other Ambulatory Visit: Payer: Self-pay | Admitting: *Deleted

## 2011-08-01 MED ORDER — INSULIN PEN NEEDLE 31G X 8 MM MISC: Status: DC

## 2011-08-01 NOTE — Telephone Encounter (Signed)
R'cd fax from [facility] for refill of pt's Pen needles.   Last OV-06/17/2011  Last filled-04/23/2011

## 2011-08-09 ENCOUNTER — Other Ambulatory Visit: Payer: Self-pay

## 2011-08-09 MED ORDER — FUROSEMIDE 20 MG PO TABS: 20.0000 mg | ORAL_TABLET | Freq: Every day | ORAL | Status: DC

## 2011-08-11 ENCOUNTER — Encounter (HOSPITAL_COMMUNITY): Payer: Self-pay | Admitting: *Deleted

## 2011-08-11 ENCOUNTER — Other Ambulatory Visit: Payer: Self-pay

## 2011-08-11 ENCOUNTER — Emergency Department (HOSPITAL_COMMUNITY): Payer: Medicare Other

## 2011-08-11 ENCOUNTER — Inpatient Hospital Stay (HOSPITAL_COMMUNITY)
Admission: EM | Admit: 2011-08-11 | Discharge: 2011-08-14 | DRG: 292 | Disposition: A | Discharge status: Home / Self Care | Payer: Medicare Other | Attending: Internal Medicine | Admitting: Internal Medicine

## 2011-08-11 DIAGNOSIS — F3289 Other specified depressive episodes: Secondary | ICD-10-CM | POA: Diagnosis present

## 2011-08-11 DIAGNOSIS — Z79899 Other long term (current) drug therapy: Secondary | ICD-10-CM

## 2011-08-11 DIAGNOSIS — Z794 Long term (current) use of insulin: Secondary | ICD-10-CM

## 2011-08-11 DIAGNOSIS — E119 Type 2 diabetes mellitus without complications: Secondary | ICD-10-CM | POA: Diagnosis present

## 2011-08-11 DIAGNOSIS — R0609 Other forms of dyspnea: Secondary | ICD-10-CM | POA: Diagnosis present

## 2011-08-11 DIAGNOSIS — E669 Obesity, unspecified: Secondary | ICD-10-CM | POA: Diagnosis present

## 2011-08-11 DIAGNOSIS — I10 Essential (primary) hypertension: Secondary | ICD-10-CM | POA: Diagnosis present

## 2011-08-11 DIAGNOSIS — K589 Irritable bowel syndrome without diarrhea: Secondary | ICD-10-CM | POA: Diagnosis present

## 2011-08-11 DIAGNOSIS — E039 Hypothyroidism, unspecified: Secondary | ICD-10-CM | POA: Diagnosis present

## 2011-08-11 DIAGNOSIS — F329 Major depressive disorder, single episode, unspecified: Secondary | ICD-10-CM | POA: Diagnosis present

## 2011-08-11 DIAGNOSIS — I5023 Acute on chronic systolic (congestive) heart failure: Principal | ICD-10-CM | POA: Diagnosis present

## 2011-08-11 DIAGNOSIS — R0989 Other specified symptoms and signs involving the circulatory and respiratory systems: Secondary | ICD-10-CM | POA: Diagnosis present

## 2011-08-11 DIAGNOSIS — K3184 Gastroparesis: Secondary | ICD-10-CM | POA: Diagnosis present

## 2011-08-11 DIAGNOSIS — M1990 Unspecified osteoarthritis, unspecified site: Secondary | ICD-10-CM | POA: Diagnosis present

## 2011-08-11 DIAGNOSIS — T451X5A Adverse effect of antineoplastic and immunosuppressive drugs, initial encounter: Secondary | ICD-10-CM | POA: Diagnosis present

## 2011-08-11 DIAGNOSIS — Z853 Personal history of malignant neoplasm of breast: Secondary | ICD-10-CM

## 2011-08-11 DIAGNOSIS — Z882 Allergy status to sulfonamides status: Secondary | ICD-10-CM

## 2011-08-11 DIAGNOSIS — F411 Generalized anxiety disorder: Secondary | ICD-10-CM | POA: Diagnosis present

## 2011-08-11 DIAGNOSIS — Z87891 Personal history of nicotine dependence: Secondary | ICD-10-CM

## 2011-08-11 DIAGNOSIS — Z9119 Patient's noncompliance with other medical treatment and regimen: Secondary | ICD-10-CM

## 2011-08-11 DIAGNOSIS — I509 Heart failure, unspecified: Secondary | ICD-10-CM | POA: Diagnosis present

## 2011-08-11 DIAGNOSIS — I5022 Chronic systolic (congestive) heart failure: Secondary | ICD-10-CM

## 2011-08-11 DIAGNOSIS — E876 Hypokalemia: Secondary | ICD-10-CM | POA: Diagnosis present

## 2011-08-11 DIAGNOSIS — Z91199 Patient's noncompliance with other medical treatment and regimen due to unspecified reason: Secondary | ICD-10-CM

## 2011-08-11 DIAGNOSIS — K219 Gastro-esophageal reflux disease without esophagitis: Secondary | ICD-10-CM | POA: Diagnosis present

## 2011-08-11 DIAGNOSIS — G4733 Obstructive sleep apnea (adult) (pediatric): Secondary | ICD-10-CM | POA: Diagnosis present

## 2011-08-11 DIAGNOSIS — I959 Hypotension, unspecified: Secondary | ICD-10-CM | POA: Diagnosis present

## 2011-08-11 DIAGNOSIS — G2581 Restless legs syndrome: Secondary | ICD-10-CM | POA: Diagnosis present

## 2011-08-11 DIAGNOSIS — Z888 Allergy status to other drugs, medicaments and biological substances status: Secondary | ICD-10-CM

## 2011-08-11 DIAGNOSIS — I428 Other cardiomyopathies: Secondary | ICD-10-CM | POA: Diagnosis present

## 2011-08-11 DIAGNOSIS — Z886 Allergy status to analgesic agent status: Secondary | ICD-10-CM

## 2011-08-11 LAB — PRO B NATRIURETIC PEPTIDE: Pro B Natriuretic peptide (BNP): 868.7 pg/mL — ABNORMAL HIGH (ref 0–125)

## 2011-08-11 LAB — CBC
HCT: 38.5 % (ref 36.0–46.0)
Hemoglobin: 12.8 g/dL (ref 12.0–15.0)
MCH: 28.5 pg (ref 26.0–34.0)
MCHC: 33.2 g/dL (ref 30.0–36.0)
RDW: 15.9 % — ABNORMAL HIGH (ref 11.5–15.5)

## 2011-08-11 LAB — DIFFERENTIAL
Basophils Absolute: 0.1 10*3/uL (ref 0.0–0.1)
Basophils Relative: 0 % (ref 0–1)
Eosinophils Absolute: 0.2 10*3/uL (ref 0.0–0.7)
Monocytes Absolute: 0.6 10*3/uL (ref 0.1–1.0)
Monocytes Relative: 6 % (ref 3–12)
Neutro Abs: 8.3 10*3/uL — ABNORMAL HIGH (ref 1.7–7.7)
Neutrophils Relative %: 74 % (ref 43–77)

## 2011-08-11 LAB — GLUCOSE, CAPILLARY: Glucose-Capillary: 183 mg/dL — ABNORMAL HIGH (ref 70–99)

## 2011-08-11 LAB — CREATININE, SERUM
GFR calc Af Amer: >90 mL/min (ref >90)
GFR calc non Af Amer: >90 mL/min (ref >90)

## 2011-08-11 LAB — COMPREHENSIVE METABOLIC PANEL
AST: 24 U/L (ref 0–37)
Albumin: 3.2 g/dL — ABNORMAL LOW (ref 3.5–5.2)
BUN: 12 mg/dL (ref 6–23)
Creatinine, Ser: 0.57 mg/dL (ref 0.50–1.10)
Total Protein: 7.3 g/dL (ref 6.0–8.3)

## 2011-08-11 MED ORDER — RAMIPRIL 2.5 MG PO TABS: 2.5000 mg | ORAL_TABLET | Freq: Two times a day (BID) | ORAL | Status: DC

## 2011-08-11 MED ORDER — TRAZODONE HCL 100 MG PO TABS
100.0000 mg | ORAL_TABLET | Freq: Two times a day (BID) | ORAL | Status: DC
  Administered 2011-08-11 – 2011-08-14 (×6): 100 mg via ORAL
  Filled 2011-08-11 (×8): qty 1

## 2011-08-11 MED ORDER — FUROSEMIDE 10 MG/ML IJ SOLN
80.0000 mg | Freq: Once | INTRAMUSCULAR | Status: AC
  Administered 2011-08-11: 80 mg via INTRAVENOUS
  Filled 2011-08-11: qty 8

## 2011-08-11 MED ORDER — SODIUM CHLORIDE 0.9 % IV SOLN: 250.0000 mL | INTRAVENOUS | Status: DC | PRN

## 2011-08-11 MED ORDER — LEVOTHYROXINE SODIUM 75 MCG PO TABS
75.0000 ug | ORAL_TABLET | Freq: Every day | ORAL | Status: DC
  Administered 2011-08-12 – 2011-08-14 (×3): 75 ug via ORAL
  Filled 2011-08-11 (×4): qty 1

## 2011-08-11 MED ORDER — HEPARIN SODIUM (PORCINE) 5000 UNIT/ML IJ SOLN
5000.0000 [IU] | Freq: Three times a day (TID) | INTRAMUSCULAR | Status: DC
  Administered 2011-08-11 – 2011-08-14 (×8): 5000 [IU] via SUBCUTANEOUS
  Filled 2011-08-11 (×11): qty 1

## 2011-08-11 MED ORDER — BUSPIRONE HCL 15 MG PO TABS
30.0000 mg | ORAL_TABLET | Freq: Two times a day (BID) | ORAL | Status: DC
  Administered 2011-08-11 – 2011-08-13 (×4): 30 mg via ORAL
  Filled 2011-08-11 (×6): qty 2

## 2011-08-11 MED ORDER — CYCLOBENZAPRINE HCL 10 MG PO TABS: 10.0000 mg | ORAL_TABLET | Freq: Two times a day (BID) | ORAL | Status: DC | PRN

## 2011-08-11 MED ORDER — ONDANSETRON HCL 4 MG/2ML IJ SOLN: 4.0000 mg | Freq: Four times a day (QID) | INTRAMUSCULAR | Status: DC | PRN

## 2011-08-11 MED ORDER — DIGOXIN 125 MCG PO TABS
125.0000 ug | ORAL_TABLET | Freq: Every day | ORAL | Status: DC
  Administered 2011-08-11 – 2011-08-14 (×4): 125 ug via ORAL
  Filled 2011-08-11 (×4): qty 1

## 2011-08-11 MED ORDER — SPIRONOLACTONE 25 MG PO TABS
25.0000 mg | ORAL_TABLET | Freq: Every day | ORAL | Status: DC
  Administered 2011-08-12 – 2011-08-14 (×3): 25 mg via ORAL
  Filled 2011-08-11 (×3): qty 1

## 2011-08-11 MED ORDER — TIOTROPIUM BROMIDE MONOHYDRATE 18 MCG IN CAPS
18.0000 ug | ORAL_CAPSULE | Freq: Every day | RESPIRATORY_TRACT | Status: DC
  Administered 2011-08-12 – 2011-08-14 (×3): 18 ug via RESPIRATORY_TRACT
  Filled 2011-08-11: qty 5

## 2011-08-11 MED ORDER — ALBUTEROL SULFATE HFA 108 (90 BASE) MCG/ACT IN AERS
2.0000 | INHALATION_SPRAY | RESPIRATORY_TRACT | Status: DC
  Filled 2011-08-11: qty 6.7

## 2011-08-11 MED ORDER — RAMIPRIL 2.5 MG PO CAPS
2.5000 mg | ORAL_CAPSULE | Freq: Two times a day (BID) | ORAL | Status: DC
  Administered 2011-08-11 – 2011-08-14 (×6): 2.5 mg via ORAL
  Filled 2011-08-11 (×7): qty 1

## 2011-08-11 MED ORDER — OMEGA-3-ACID ETHYL ESTERS 1 G PO CAPS
4.0000 | ORAL_CAPSULE | Freq: Every day | ORAL | Status: DC
  Administered 2011-08-11 – 2011-08-14 (×4): 4 g via ORAL
  Filled 2011-08-11 (×4): qty 4

## 2011-08-11 MED ORDER — POTASSIUM CHLORIDE CRYS ER 20 MEQ PO TBCR: 60.0000 meq | EXTENDED_RELEASE_TABLET | Freq: Two times a day (BID) | ORAL | Status: DC

## 2011-08-11 MED ORDER — SODIUM CHLORIDE 0.9 % IV SOLN
Freq: Once | INTRAVENOUS | Status: AC
  Administered 2011-08-11: 1000 mL via INTRAVENOUS

## 2011-08-11 MED ORDER — ACETAMINOPHEN 325 MG PO TABS: 650.0000 mg | ORAL_TABLET | ORAL | Status: DC | PRN

## 2011-08-11 MED ORDER — PANTOPRAZOLE SODIUM 40 MG PO TBEC
40.0000 mg | DELAYED_RELEASE_TABLET | Freq: Two times a day (BID) | ORAL | Status: DC
  Administered 2011-08-11 – 2011-08-14 (×6): 40 mg via ORAL
  Filled 2011-08-11 (×6): qty 1

## 2011-08-11 MED ORDER — CARVEDILOL 25 MG PO TABS
25.0000 mg | ORAL_TABLET | Freq: Two times a day (BID) | ORAL | Status: DC
  Administered 2011-08-12 (×2): 25 mg via ORAL
  Filled 2011-08-11 (×3): qty 1

## 2011-08-11 MED ORDER — NITROGLYCERIN 0.4 MG SL SUBL: 0.4000 mg | SUBLINGUAL_TABLET | SUBLINGUAL | Status: DC | PRN

## 2011-08-11 MED ORDER — ACETAMINOPHEN ER 650 MG PO TBCR: 1300.0000 mg | EXTENDED_RELEASE_TABLET | Freq: Three times a day (TID) | ORAL | Status: DC | PRN

## 2011-08-11 MED ORDER — ROSUVASTATIN CALCIUM 20 MG PO TABS
20.0000 mg | ORAL_TABLET | Freq: Every day | ORAL | Status: DC
  Administered 2011-08-11 – 2011-08-14 (×4): 20 mg via ORAL
  Filled 2011-08-11 (×4): qty 1

## 2011-08-11 MED ORDER — ROPINIROLE HCL 1 MG PO TABS
1.0000 mg | ORAL_TABLET | Freq: Every day | ORAL | Status: DC
  Administered 2011-08-11 – 2011-08-13 (×3): 1 mg via ORAL
  Filled 2011-08-11 (×4): qty 1

## 2011-08-11 MED ORDER — SODIUM CHLORIDE 0.9 % IJ SOLN: 3.0000 mL | INTRAMUSCULAR | Status: DC | PRN

## 2011-08-11 MED ORDER — CITALOPRAM HYDROBROMIDE 20 MG PO TABS
20.0000 mg | ORAL_TABLET | Freq: Every day | ORAL | Status: DC
  Administered 2011-08-11 – 2011-08-14 (×4): 20 mg via ORAL
  Filled 2011-08-11 (×4): qty 1

## 2011-08-11 MED ORDER — NITROGLYCERIN 2 % TD OINT
1.0000 [in_us] | TOPICAL_OINTMENT | Freq: Once | TRANSDERMAL | Status: AC
  Administered 2011-08-11: 1 [in_us] via TOPICAL
  Filled 2011-08-11: qty 1

## 2011-08-11 MED ORDER — TRAMADOL HCL 50 MG PO TABS
50.0000 mg | ORAL_TABLET | Freq: Four times a day (QID) | ORAL | Status: DC | PRN
  Administered 2011-08-11: 50 mg via ORAL
  Filled 2011-08-11: qty 1

## 2011-08-11 MED ORDER — SODIUM CHLORIDE 0.9 % IJ SOLN
3.0000 mL | Freq: Two times a day (BID) | INTRAMUSCULAR | Status: DC
  Administered 2011-08-11 – 2011-08-13 (×5): 3 mL via INTRAVENOUS

## 2011-08-11 MED ORDER — POTASSIUM CHLORIDE CRYS ER 20 MEQ PO TBCR
60.0000 meq | EXTENDED_RELEASE_TABLET | Freq: Two times a day (BID) | ORAL | Status: DC
  Administered 2011-08-11 – 2011-08-14 (×6): 60 meq via ORAL
  Filled 2011-08-11 (×7): qty 3

## 2011-08-11 MED ORDER — ACETAMINOPHEN 325 MG PO TABS
650.0000 mg | ORAL_TABLET | Freq: Four times a day (QID) | ORAL | Status: DC | PRN
  Administered 2011-08-12: 650 mg via ORAL
  Filled 2011-08-11: qty 2

## 2011-08-11 MED ORDER — FUROSEMIDE 10 MG/ML IJ SOLN
60.0000 mg | Freq: Two times a day (BID) | INTRAMUSCULAR | Status: DC
  Administered 2011-08-12 (×2): 60 mg via INTRAVENOUS
  Filled 2011-08-11 (×5): qty 6

## 2011-08-11 NOTE — ED Notes (Signed)
Pt reports sob x 1 week, became more severe today and left chest tightness that started last night. Appears moderate sob at triage, spo2 96% on room air, ekg done. Reports being out of digoxin, coreg, and ramipril since 12/18.

## 2011-08-11 NOTE — ED Notes (Signed)
I gave the patient a warm blanket. 

## 2011-08-11 NOTE — ED Notes (Signed)
Received report assumed patient care. 

## 2011-08-11 NOTE — ED Notes (Signed)
I placed a 16 foley catheter in the patient. Nurse Kriste Basque helped.

## 2011-08-11 NOTE — ED Notes (Signed)
Report called to [id number]  Nurse Wyman Songster

## 2011-08-11 NOTE — ED Notes (Signed)
Patient to floor at this time with tele tech and monitor  vss.

## 2011-08-11 NOTE — ED Notes (Signed)
MD at bedside  Cardiologist in to see patient .

## 2011-08-11 NOTE — ED Provider Notes (Signed)
5:21 PM Patient care resumed from Dr. Fonnie Jarvis.  Patient likely having an exacerbation of her chronic heart failure and is fluid overloaded.   [facility] has been consulted and will come see patient in the emergency department.  Patient has been started on Lasix and the catheter has been placed.  Patient reevaluated and is currently stable other than tachycardia.  Patient lying resting comfortably and in no acute distress.  Heart rate is currently 122.  Patient denies chest pain, mild shortness of breath persists, however it does not appear the patient is currently in respiratory distress. Lungs: Bilateral basilar rales heard on auscultation.  Patient able to speak full sentences.  Heart: Tachycardic regular rhythm.  Peripheral: Distal pulses palpable, pitting edema noted bilaterally.  7:16 PM  > 1 L removed from pt,  I will not re dose lasix at this point. Patient reevaluated and states she is feeling much better. No current complaints.    8:30 PM  Dr. Kirtland Bouchard, [facility] to admit patient to hospital.  [place], [place] 08/11/11 2044

## 2011-08-11 NOTE — ED Notes (Signed)
Pt states had right mastectomy, however, advises she can have sticks/BP checks in right arm and states prefers to have IV in right arm.

## 2011-08-11 NOTE — ED Provider Notes (Signed)
History     CSN: [id number]  Arrival date & time 08/11/11  1440   First MD Initiated Contact with Patient 08/11/11 1514      Chief Complaint  Patient presents with  . Shortness of Breath  . Chest Pain    (Consider location/radiation/quality/duration/timing/severity/associated sxs/prior treatment) HPI Per [facility] clinic [id number] note: 56 year old female with history of breast cancer, obesity, HTN, diabetes, IBS, and OSA. She had chemo induced cardiomyopathy. Cardiac MRI in July 2010 EF 45%. Myoview in 10/11 EF 59%. No ischemia.  This 56 year old female with known chronic heart failure now presents with one to 2 weeks with gradual onset shortness of breath worse with activity and also now at rest for 2 days, she has constant chest tightness feeling in 24 hours a day for the last 2 days, her regular pulse oximetry is normal at 96% and she has no fever cough, she ran out of her digoxin, Coreg, and ramipril on December 18,  She also has at least 6 pounds weight gain and some increased edema and orthopnea over the last one to 2 weeks. She has no abdominal pain vomiting or localized weakness or numbness. Her treatment prior to arrival is taking her daily Lasix 80 mg however for the last few days she has taken 100 mg Lasix daily.  Past Medical History  Diagnosis Date  . 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 DYSLIPIDEMIA 05/01/2009  . OBESITY 07/24/2007  . OBSTRUCTIVE SLEEP APNEA 07/24/2007    with CPAP compliance  . RESTLESS LEG SYNDROME 07/24/2007  . SYSTOLIC HEART FAILURE, CHRONIC 01/05/2009  . TACHYCARDIA 10/25/2008  . Palpitations 09/20/2010  . 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 Hypertension   . 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 Previous EF 25%. ECHO 6/08 EF 55% ECHO 5/10 EF 30-35% MRI 7/10 EF 45% Myoview 10/11 59% no ischemia  . History of breast cancer 2006    with return in 2008  . Osteoarthritis   . Anxiety   .  Depression     followed by a psychiatrist  . Asthma   . GERD (gastroesophageal reflux disease)   . Gastroparesis   . IBS (irritable bowel syndrome)     Past Surgical History  Procedure Date  . Mastectomy     right  . Abdominal hysterectomy 1991    Family History  Problem Relation Age of Onset  . Diabetes Mother   . Hypertension Other   . Coronary artery disease Father 87    History  Substance Use Topics  . Smoking status: Former Games developer  . Smokeless tobacco: Former Neurosurgeon    Quit date: 03/05/2001  . Alcohol Use: No    OB History    Grav Para Term Preterm Abortions TAB SAB Ect Mult Living                  Review of Systems  Constitutional: Negative for fever.       10 Systems reviewed and are negative for acute change except as noted in the HPI.  HENT: Negative for congestion.   Eyes: Negative for discharge and redness.  Respiratory: Positive for chest tightness and shortness of breath. Negative for cough.   Cardiovascular: Positive for leg swelling. Negative for chest pain and palpitations.  Gastrointestinal: Negative for vomiting and abdominal pain.  Musculoskeletal: Negative for back pain.  Skin: Negative for rash.  Neurological: Negative for syncope, numbness and headaches.  Psychiatric/Behavioral:  No behavior change.    Allergies  Aspirin; Ciprofloxacin; Codeine; Naproxen; Sulfonamide derivatives; and Tape  Home Medications   No current outpatient prescriptions on file.  BP 106/56  Pulse 120  Temp(Src) 98 F (36.7 C) (Oral)  Resp 24  Ht 5\' 5"  (1.651 m)  Wt 232 lb (105.235 kg)  BMI 38.61 kg/m2  SpO2 100%  Physical Exam  Nursing note and vitals reviewed. Constitutional:       Awake, alert, nontoxic appearance.  HENT:  Head: Atraumatic.  Eyes: Right eye exhibits no discharge. Left eye exhibits no discharge.  Neck: Neck supple.  Cardiovascular: Regular rhythm.   No murmur heard.      Tachycardic  Pulmonary/Chest: She is in respiratory  distress. She has no wheezes. She has rales. She exhibits no tenderness.       Bi basilar crackles; mild posterior distress at rest is able to speak sentences and room air pulse oximetry normal at 96%  Abdominal: Soft. There is no tenderness. There is no rebound.  Musculoskeletal: She exhibits edema. She exhibits no tenderness.       Baseline ROM, no obvious new focal weakness.  Neurological: She is alert.       Mental status and motor strength appears baseline for patient and situation.  Skin: No rash noted.  Psychiatric: She has a normal mood and affect.    ED Course  Procedures (including critical care time)  ECG: Sinus tachycardia, ventricular rate 127, left axis deviation, incomplete right bundle branch block, septal Q waves, no significant change compared with October 2012  Medical screening examination/treatment/procedure(s) were conducted as a shared visit with non-physician practitioner(s) and myself.  I personally evaluated the patient during the encounter Labs Reviewed  PRO B NATRIURETIC PEPTIDE - Abnormal; Notable for the following:    Pro B Natriuretic peptide (BNP) 868.7 (*)    All other components within normal limits  CBC - Abnormal; Notable for the following:    WBC 11.2 (*)    RDW 15.9 (*)    All other components within normal limits  DIFFERENTIAL - Abnormal; Notable for the following:    Neutro Abs 8.3 (*)    All other components within normal limits  COMPREHENSIVE METABOLIC PANEL - Abnormal; Notable for the following:    Glucose, Bld 199 (*)    Albumin 3.2 (*)    All other components within normal limits  GLUCOSE, CAPILLARY - Abnormal; Notable for the following:    Glucose-Capillary 183 (*)    All other components within normal limits  POCT I-STAT TROPONIN I  I-STAT TROPONIN I  BASIC METABOLIC PANEL  CBC  CREATININE, SERUM   Dg Chest 2 View  08/11/2011  *RADIOLOGY REPORT*  Clinical Data: Shortness of breath.  Chest pain.  Hypertension. Diabetes.  Breast  cancer.  Asthma.  COPD.  Ex-smoker.  CHEST - 2 VIEW  Comparison: 05/21/2011.  Findings: Poor inspiration.  Enlarged cardiac silhouette without gross change.  Stable prominence of the pulmonary vasculature and interstitial markings.  Cholecystectomy clips.  Unremarkable bones.  IMPRESSION: No gross change in cardiomegaly, pulmonary vascular congestion and chronic interstitial lung disease.  No acute abnormality.  Original Report Authenticated By: Darrol Angel, M.D.     1. Heart failure   2. Chronic systolic heart failure       MDM          Hurman Horn, MD 08/11/11 2202

## 2011-08-11 NOTE — ED Provider Notes (Signed)
Medical screening examination/treatment/procedure(s) were conducted as a shared visit with non-physician practitioner(s) and myself.  I personally evaluated the patient during the encounter  Hurman Horn, MD 08/11/11 2122

## 2011-08-11 NOTE — H&P (Signed)
CARDIOLOGY ADMISSION NOTE  Patient ID: [name] MRN: [id number] DOB/AGE: 12/03/1955 56 y.o.  Admit date: 08/11/2011 Primary Physician   Amy Waynetta Sandy NP Primary Cardiologist   Dr. Gala Romney Chief Complaint    SOB  HPI:      The patient has a history of a nonischemic cardiomyopathy. She says she ran out of her medications on the 18th and there was some confusion at the pharmacy. She couldn't get them filled. She also says she's probably had more salt in her diet over the holidays. She's gained about 8 pounds since Christmas. She's had increasing dyspnea. She typically sleeps on one pillow but is now sleeping on 3 pillows. She did take an extra 20 mg of Lasix over the last 3 days but still has had increasing dyspnea. She has baseline Class II - III but is SOB at rest now.  She does feel better following IV Lasix in the emergency room. She's had some chest fullness which she sometimes gets with either anxiety or when she is in heart failure. She's not had any neck or arm discomfort. She's had no fevers chills or cough. She's had no nausea vomiting diarrhea or constipation.  Past Medical History  Diagnosis Date  . 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 DYSLIPIDEMIA 05/01/2009  . OBESITY 07/24/2007  . OBSTRUCTIVE SLEEP APNEA 07/24/2007    with CPAP compliance  . RESTLESS LEG SYNDROME 07/24/2007  . SYSTOLIC HEART FAILURE, CHRONIC 01/05/2009  . TACHYCARDIA 10/25/2008  . Palpitations 09/20/2010  . 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 Hypertension   . 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 Previous EF 25%. ECHO 6/08 EF 55% ECHO 5/10 EF 30-35% MRI 7/10 EF 45% Myoview 10/11 59% no ischemia  . History of breast cancer 2006    with return in 2008  . Osteoarthritis   . Anxiety   . Depression     followed by a psychiatrist  . Asthma   . GERD (gastroesophageal reflux disease)   . Gastroparesis   . IBS (irritable bowel syndrome)       Past Surgical History  Procedure Date  . Mastectomy     right  . Abdominal hysterectomy 1991    Allergies  Allergen Reactions  . Aspirin   . Ciprofloxacin   . Codeine   . Naproxen   . Sulfonamide Derivatives   . Tape Rash    Also allergic to metal   No current facility-administered medications on file prior to encounter.   Current Outpatient Prescriptions on File Prior to Encounter  Medication Sig Dispense Refill  . acetaminophen (TYLENOL ARTHRITIS PAIN) 650 MG CR tablet Take 1,300 mg by mouth every 8 (eight) hours as needed. pain      . albuterol (PROVENTIL HFA) 108 (90 BASE) MCG/ACT inhaler Inhale 2 puffs into the lungs 2 (two) times a week.       Marland Kitchen atorvastatin (LIPITOR) 40 MG tablet Take 40 mg by mouth daily.        . busPIRone (BUSPAR) 30 MG tablet Take 30 mg by mouth 2 (two) times daily.        . carvedilol (COREG) 25 MG tablet Take 1 tablet (25 mg total) by mouth 2 (two) times daily with a meal.  60 tablet  2  . citalopram (CELEXA) 20 MG tablet 2 tablets by mouth once daily       . cyclobenzaprine (FLEXERIL) 10 MG tablet Take 1 tablet (  10 mg total) by mouth 2 (two) times daily as needed.  60 tablet  5  . digoxin (LANOXIN) 0.125 MG tablet Take 1 tablet (125 mcg total) by mouth daily.  30 tablet  2  . diphenhydramine-acetaminophen (TYLENOL PM) 25-500 MG TABS Take 1 tablet by mouth at bedtime as needed.        . Glucosamine 500 MG CAPS Take 1 capsule by mouth daily.        . insulin lispro protamine-insulin lispro (HUMALOG 75/25) (75-25) 100 UNIT/ML SUSP 225 units with breakfast, and 250 units with the evening meal.      . levothyroxine (SYNTHROID, LEVOTHROID) 75 MCG tablet Take 75 mcg by mouth daily.        Marland Kitchen loperamide (IMODIUM A-D) 2 MG tablet Take 2 mg by mouth as needed. diarrhea      . mometasone (NASONEX) 50 MCG/ACT nasal spray Place 2 sprays into the nose daily as needed. allergies      . nitroGLYCERIN (NITROSTAT) 0.4 MG SL tablet Place 1 tablet (0.4 mg total) under  the tongue as needed.  25 tablet  6  . omega-3 acid ethyl esters (LOVAZA) 1 G capsule Take 4 capsules by mouth daily.        . pantoprazole (PROTONIX) 40 MG tablet Take 40 mg by mouth 2 (two) times daily.        . ramipril (ALTACE) 2.5 MG tablet Take 2.5 mg by mouth 2 (two) times daily.        Marland Kitchen rOPINIRole (REQUIP) 1 MG tablet Take 1 mg by mouth at bedtime.        Marland Kitchen spironolactone (ALDACTONE) 25 MG tablet Take 1 tablet (25 mg total) by mouth daily.  30 tablet  11  . tiotropium (SPIRIVA) 18 MCG inhalation capsule Place 18 mcg into inhaler and inhale daily.        . traMADol (ULTRAM) 50 MG tablet Take 50 mg by mouth every 6 (six) hours as needed. pain      . traZODone (DESYREL) 50 MG tablet Take 100 mg by mouth 2 (two) times daily. Can take extra tablet if needed.      . Insulin Pen Needle (B-D ULTRAFINE III SHORT PEN) 31G X 8 MM MISC 18/day as directed dx 250.00  500 each  11   History   Social History  . Marital Status: Divorced    Spouse Name: N/A    Number of Children: N/A  . Years of Education: N/A   Occupational History  . Disabled    Social History Main Topics  . Smoking status: Former Games developer  . Smokeless tobacco: Former Neurosurgeon    Quit date: 03/05/2001  . Alcohol Use: No  . Drug Use: No  . Sexually Active: Not on file   Other Topics Concern  . Not on file   Social History Narrative   Lives with motherRegular exercise-no    Family History  Problem Relation Age of Onset  . Diabetes Mother   . Hypertension Other   . Coronary artery disease Other     ROS:  As stated in the HPI and negative for all other systems.  Physical Exam: Blood pressure 111/72, pulse 121, temperature 97.8 F (36.6 C), temperature source Oral, resp. rate 24, height 5\' 5"  (1.651 m), weight 105.235 kg (232 lb), SpO2 97.00%.  GENERAL:  No acute distressappearing HEENT:  Pupils equal round and reactive, fundi not visualized, oral mucosa unremarkable, upper denture NECK:  No jugular venous distention,  waveform  within normal limits, carotid upstroke brisk and symmetric, no bruits, no thyromegaly LYMPHATICS:  No cervical, inguinal adenopathy LUNGS:  Clear to auscultation bilaterally BACK:  No CVA tenderness CHEST:  Unremarkable HEART:  PMI not displaced or sustained,S1 and S2 within normal limits, positive S3, no S4, no clicks, no rubs, no murmurs ABD:  Obese, positive bowel sounds normal in frequency in pitch, no bruits, no rebound, no guarding, no midline pulsatile mass, no hepatomegaly, no splenomegaly EXT:  2 plus pulses throughout, trace edema, no cyanosis no clubbing, unable to appreciate femorals with large pannus.   SKIN:  No rashes no nodules NEURO:  Cranial nerves II through XII grossly intact, motor grossly intact throughout PSYCH:  Cognitively intact, oriented to person place and time  Labs: Lab Results  Component Value Date   BUN 12 08/11/2011   Lab Results  Component Value Date   CREATININE 0.57 08/11/2011   Lab Results  Component Value Date   NA 136 08/11/2011   K 4.2 08/11/2011   CL 98 08/11/2011   CO2 25 08/11/2011   Lab Results  Component Value Date   CKTOTAL 70 05/05/2010   CKMB 1.4 05/05/2010   TROPONINI  Value: 0.02        NO INDICATION OF MYOCARDIAL INJURY. 05/05/2010   Lab Results  Component Value Date   WBC 11.2* 08/11/2011   HGB 12.8 08/11/2011   HCT 38.5 08/11/2011   MCV 85.7 08/11/2011   PLT 311 08/11/2011    Lab Results  Component Value Date   ALT 15 08/11/2011   AST 24 08/11/2011   ALKPHOS 76 08/11/2011   BILITOT 0.5 08/11/2011      Radiology:  CXR:  No gross change in cardiomegaly, pulmonary vascular congestion and chronic interstitial lung disease. No acute abnormality.  EKG:    ASSESSMENT AND PLAN:   1)  CHF:  This is related to running out of her medications and probable noncompliance with salt and fluid restriction over the holidays. We will continue to educate. I will reinitiate her previous medications with IV Lasix for now. She is somewhat hypotensive so  we will have to watch her blood pressure carefully.  2) Diabetes:  She takes extraordinary doses of insulin but is followed closely by Dr. Everardo All.  I will continue her home regimine  3)  Obesity:  Educate  4)  Sleep apnea:  CPAP with home settings  5)  Hyopothyroid:  Continue previous meds and check TSH  5)  Depression:  Continue previous medications.     SignedRollene Rotunda 08/11/2011, 6:26 PM

## 2011-08-11 NOTE — ED Notes (Signed)
Pt requesting foley catheter d/t Lasix to be given.  Per Evelena Peat, PA, order received for f/c.

## 2011-08-12 DIAGNOSIS — I5023 Acute on chronic systolic (congestive) heart failure: Secondary | ICD-10-CM

## 2011-08-12 DIAGNOSIS — I519 Heart disease, unspecified: Secondary | ICD-10-CM

## 2011-08-12 LAB — BASIC METABOLIC PANEL
CO2: 29 mEq/L (ref 19–32)
Chloride: 99 mEq/L (ref 96–112)
GFR calc Af Amer: >90 mL/min (ref >90)
Potassium: 3.3 mEq/L — ABNORMAL LOW (ref 3.5–5.1)

## 2011-08-12 LAB — CBC
HCT: 40.2 % (ref 36.0–46.0)
Hemoglobin: 13.3 g/dL (ref 12.0–15.0)
RBC: 4.7 MIL/uL (ref 3.87–5.11)
RDW: 15.8 % — ABNORMAL HIGH (ref 11.5–15.5)
WBC: 9.6 10*3/uL (ref 4.0–10.5)

## 2011-08-12 LAB — T4, FREE: Free T4: 1.17 ng/dL (ref 0.80–1.80)

## 2011-08-12 LAB — IRON AND TIBC: Saturation Ratios: 16 % — ABNORMAL LOW (ref 20–55)

## 2011-08-12 LAB — FOLATE: Folate: 14.8 ng/mL

## 2011-08-12 LAB — RETICULOCYTES
RBC.: 4.46 MIL/uL (ref 3.87–5.11)
Retic Count, Absolute: 120.4 10*3/uL (ref 19.0–186.0)

## 2011-08-12 LAB — GLUCOSE, CAPILLARY
Glucose-Capillary: 112 mg/dL — ABNORMAL HIGH (ref 70–99)
Glucose-Capillary: 220 mg/dL — ABNORMAL HIGH (ref 70–99)

## 2011-08-12 LAB — VITAMIN B12: Vitamin B-12: 413 pg/mL (ref 211–911)

## 2011-08-12 MED ORDER — INSULIN ASPART PROT & ASPART (70-30 MIX) 100 UNIT/ML ~~LOC~~ SUSP
250.0000 [IU] | Freq: Every day | SUBCUTANEOUS | Status: DC
  Filled 2011-08-12: qty 3

## 2011-08-12 MED ORDER — INSULIN ASPART PROT & ASPART (70-30 MIX) 100 UNIT/ML ~~LOC~~ SUSP
125.0000 [IU] | Freq: Every day | SUBCUTANEOUS | Status: DC
  Administered 2011-08-12 – 2011-08-13 (×2): 125 [IU] via SUBCUTANEOUS
  Filled 2011-08-12: qty 3

## 2011-08-12 MED ORDER — INSULIN ASPART PROT & ASPART (70-30 MIX) 100 UNIT/ML ~~LOC~~ SUSP: 100.0000 [IU] | Freq: Every day | SUBCUTANEOUS | Status: DC

## 2011-08-12 MED ORDER — CARVEDILOL 12.5 MG PO TABS
12.5000 mg | ORAL_TABLET | Freq: Two times a day (BID) | ORAL | Status: DC
  Administered 2011-08-13 – 2011-08-14 (×3): 12.5 mg via ORAL
  Filled 2011-08-12 (×5): qty 1

## 2011-08-12 MED ORDER — INSULIN ASPART PROT & ASPART (70-30 MIX) 100 UNIT/ML ~~LOC~~ SUSP
100.0000 [IU] | Freq: Every day | SUBCUTANEOUS | Status: DC
  Administered 2011-08-12 – 2011-08-14 (×3): 100 [IU] via SUBCUTANEOUS

## 2011-08-12 MED ORDER — ALBUTEROL SULFATE HFA 108 (90 BASE) MCG/ACT IN AERS
2.0000 | INHALATION_SPRAY | RESPIRATORY_TRACT | Status: DC | PRN
  Filled 2011-08-12: qty 6.7

## 2011-08-12 MED ORDER — INSULIN ASPART PROT & ASPART (70-30 MIX) 100 UNIT/ML ~~LOC~~ SUSP
225.0000 [IU] | Freq: Every day | SUBCUTANEOUS | Status: DC
  Filled 2011-08-12: qty 3

## 2011-08-12 NOTE — Progress Notes (Signed)
Inpatient Diabetes Program Recommendations  AACE/ADA: New Consensus Statement on Inpatient Glycemic Control (2009)  Target Ranges:  Prepandial:   less than 140 mg/dL      Peak postprandial:   less than 180 mg/dL (1-2 hours)      Critically ill patients:  140 - 180 mg/dL    Inpatient Diabetes Program Recommendations HgbA1C: Request updated Hbg A1c.  Last available value dated 04/11/2011. Diet: If patient agreeable, recommend CHO modified medium restriction be added to current diet order.  Note: Needs order for regularly scheduled CBG's per recommendations of the American Diabetes Association for hospitalized patients with diabetes.  Request order ac & hs.  Thank you.

## 2011-08-12 NOTE — Progress Notes (Signed)
Clinical Social worker completed the psychosocial assessment which can be found in the shadow chart. CSW will continue to follow as needed.

## 2011-08-12 NOTE — Progress Notes (Signed)
Advanced Heart Failure Rounding Note   Subjective:    [name] is a 56 year old female with history of breast cancer, obesity, HTN, diabetes, IBS, and OSA. She had chemo induced cardiomyopathy. Echo 2010 EF 30-35%. Cardiac MRI in July 2010 EF 45%. However she recovered her LV function with therapy.Myoview in 10/11 EF 59%. No ischemia.   She was admitted last night for progressive dyspnea and volume overload.  She has been out of her digoxin, carvedilol, and ramipril for several weeks.  She has taken multiple extra doses of lasix as her weight progressively increased over the last couple of weeks.    Dyspnea improved but not at baseline.  Continues with dyspnea with minimal exertion.  Diuresed ~2L.  BP remains soft. No CP.   Echo today EF 20-25% with global LF dysfunction and restrictive physiology. RV normal.   Objective:    Vital Signs:   Temp:  [97.2 F (36.2 C)-98 F (36.7 C)] 97.2 F (36.2 C) (01/07 0600) Pulse Rate:  [91-126] 91  (01/07 0825) Resp:  [16-24] 18  (01/07 0600) BP: (94-123)/(56-81) 100/67 mmHg (01/07 0825) SpO2:  [93 %-100 %] 93 % (01/07 0600) Weight:  [100.6 kg (221 lb 12.5 oz)-105.235 kg (232 lb)] 227 lb 11.8 oz (103.3 kg) (01/07 0600) Last BM Date: 08/08/11  24-hour weight change: Weight change:   Intake/Output:   Intake/Output Summary (Last 24 hours) at 08/12/11 0902 Last data filed at 08/12/11 0850  Gross per 24 hour  Intake    923 ml  Output   2450 ml  Net  -1527 ml     Physical Exam: General:  Obese, NAD, O2 per N/C with no resp difficulty HEENT: normal Neck: supple. JVP 8. Carotids 2+ bilat; no bruits. No lymphadenopathy or thryomegaly appreciated. Cor: PMI nondisplaced. Tachy regular rate & rhythm. No rubs, gallops or murmurs. Lungs: clear Abdomen: soft, nontender, nondistended. No hepatosplenomegaly. No bruits or masses. Good bowel sounds. Extremities: no cyanosis, clubbing, rash, trace edema Neuro: alert & orientedx3, cranial nerves grossly  intact. moves all 4 extremities w/o difficulty. Affect pleasant  Telemetry: Sinus tach 11-120s  Labs: Basic Metabolic Panel:  Lab 08/12/11 0454 08/11/11 2156 08/11/11 1542  NA 140 -- 136  K 3.3* -- 4.2  CL 99 -- 98  CO2 29 -- 25  GLUCOSE 101* -- 199*  BUN 12 -- 12  CREATININE 0.66 0.69 0.57  CALCIUM 9.8 -- 9.9  MG -- -- --  PHOS -- -- --    Liver Function Tests:  Lab 08/11/11 1542  AST 24  ALT 15  ALKPHOS 76  BILITOT 0.5  PROT 7.3  ALBUMIN 3.2*   No results found for this basename: LIPASE:5,AMYLASE:5 in the last 168 hours No results found for this basename: AMMONIA:3 in the last 168 hours  CBC:  Lab 08/11/11 2156 08/11/11 1542  WBC 9.6 11.2*  NEUTROABS -- 8.3*  HGB 13.3 12.8  HCT 40.2 38.5  MCV 85.5 85.7  PLT 350 311    Cardiac Enzymes: No results found for this basename: CKTOTAL:5,CKMB:5,CKMBINDEX:5,TROPONINI:5 in the last 168 hours  BNP: No components found with this basename: POCBNP:5  CBG:  Lab 08/12/11 0632 08/11/11 2131  GLUCAP 112* 183*    Coagulation Studies: No results found for this basename: LABPROT:5,INR:5 in the last 72 hours  Other results:   Imaging: Dg Chest 2 View  08/11/2011  *RADIOLOGY REPORT*  Clinical Data: Shortness of breath.  Chest pain.  Hypertension. Diabetes.  Breast cancer.  Asthma.  COPD.  Ex-smoker.  CHEST - 2 VIEW  Comparison: 05/21/2011.  Findings: Poor inspiration.  Enlarged cardiac silhouette without gross change.  Stable prominence of the pulmonary vasculature and interstitial markings.  Cholecystectomy clips.  Unremarkable bones.  IMPRESSION: No gross change in cardiomegaly, pulmonary vascular congestion and chronic interstitial lung disease.  No acute abnormality.  Original Report Authenticated By: Darrol Angel, M.D.      Medications:     Scheduled Medications:    . sodium chloride   Intravenous Once  . busPIRone  30 mg Oral BID  . carvedilol  25 mg Oral BID WC  . citalopram  20 mg Oral Daily  .  digoxin  125 mcg Oral Daily  . furosemide  60 mg Intravenous BID  . furosemide  80 mg Intravenous Once  . heparin  5,000 Units Subcutaneous Q8H  . insulin aspart protamine-insulin aspart  225 Units Subcutaneous Q breakfast  . insulin aspart protamine-insulin aspart  250 Units Subcutaneous Q supper  . levothyroxine  75 mcg Oral Q0600  . nitroGLYCERIN  1 inch Topical Once  . omega-3 acid ethyl esters  4 capsule Oral Daily  . pantoprazole  40 mg Oral BID  . potassium chloride SA  60 mEq Oral BID  . ramipril  2.5 mg Oral BID  . rOPINIRole  1 mg Oral QHS  . rosuvastatin  20 mg Oral Daily  . sodium chloride  3 mL Intravenous Q12H  . spironolactone  25 mg Oral Daily  . tiotropium  18 mcg Inhalation Daily  . traZODone  100 mg Oral BID  . DISCONTD: albuterol  2 puff Inhalation 2 times weekly  . DISCONTD: potassium chloride SA  60 mEq Oral BID  . DISCONTD: ramipril  2.5 mg Oral BID     Infusions:     PRN Medications:  sodium chloride, acetaminophen, albuterol, cyclobenzaprine, nitroGLYCERIN, ondansetron (ZOFRAN) IV, sodium chloride, traMADol, DISCONTD: acetaminophen, DISCONTD: acetaminophen   Assessment:   1) Acute on Chronic Systolic Heart Failure 2) Recurrent LV dysfunction      --NICM, EF 20-25%     - chemotherapy induced  3) DM 4) OSA     - on CPAP 5) Hypotension 6) Hypokalemia 7) Depression 8) Medical noncompliance   Plan/Discussion:    Symptomatically much improved but not at baseline.  Will continue with IV lasix today with hopes of transitioning to oral lasix tomorrow.  Replete K.  BP soft will not titrate HF meds at this time.  Have discussed the importance of not running out of medications and to call the clinic if problems arise, she voices understanding.  Will cut insulin in half as her diet is much different here in the hospital and glucose running 100's.   Will discuss with SW.     Length of Stay: 1   Ulyess Blossom, [place]  08/12/2011, 9:02 AM   Patient seen  and examined with Ulyess Blossom PA-C. We discussed all aspects of the encounter. I agree with the assessment and plan as stated above.   I discussed [name] echo results with her and reinforced need to remain complaint with her meds. Hopefully LV will recover again. Still appears to have mild volume overload but diuresis is slowing down. Continue IV diuresis. Add digoxin. Agree with restarting b-block and ACE-I.   Reuel Boom [place] Jag[place],MD 9:21 PM

## 2011-08-12 NOTE — Progress Notes (Signed)
CSW met with patient to provide emotional support and complete a comprehensive psychosocial. CSW also addressed medication concerns, home health needs, and any other barriers to care. Pt currently lives with her mother and brother and has a daughterAlly [name] [phone])  who helps with her care. Pt. Reported to CSW that she ran out of her medication last week because the pharmacy, [facility], told her that she had already filled her medications and could not get her meds refilled until 08/14/11.  Pt was not aware that she could call the clinic. CSW provided patient with contact info for the clinic and for the CSW and encouraged pt to call if her medications ever became a problem again. Pt does report having some depressive symptoms; However pt. Is followed by, Leodis Liverpool, MD, at [facility] in [place].  Pt, at this time, does not want HH because of her 4 dogs who are "not friendly to people". CSW tried to explain the benefits of HH but pt is not agreeable to these services. Pt is also not a medlink referral candidate.  Clinical Social Work will continue to follow as needed

## 2011-08-12 NOTE — Plan of Care (Signed)
Problem: Phase I Progression Outcomes Goal: EF % per last Echo/documented,Core Reminder form on chart Outcome: Completed/Met Date Met:  08/12/11 20-25%

## 2011-08-12 NOTE — Progress Notes (Signed)
UR Completed.  [name] 08/12/2011 [phone]  

## 2011-08-12 NOTE — Progress Notes (Signed)
  Echocardiogram 2D Echocardiogram has been performed.  Juanita Laster Kolby Schara 08/12/2011, 2:18 PM

## 2011-08-12 NOTE — Progress Notes (Signed)
PT did not want to go on cpap at this time, says she took a nap earlier and she will let RT know when shes ready to go on it later. RT will continue to monitor.

## 2011-08-13 DIAGNOSIS — I5023 Acute on chronic systolic (congestive) heart failure: Principal | ICD-10-CM

## 2011-08-13 LAB — BASIC METABOLIC PANEL
BUN: 24 mg/dL — ABNORMAL HIGH (ref 6–23)
Creatinine, Ser: 1.12 mg/dL — ABNORMAL HIGH (ref 0.50–1.10)
GFR calc Af Amer: 62 mL/min — ABNORMAL LOW (ref >90)
GFR calc non Af Amer: 54 mL/min — ABNORMAL LOW (ref >90)

## 2011-08-13 LAB — GLUCOSE, CAPILLARY: Glucose-Capillary: 150 mg/dL — ABNORMAL HIGH (ref 70–99)

## 2011-08-13 MED ORDER — BUSPIRONE HCL 10 MG PO TABS
30.0000 mg | ORAL_TABLET | Freq: Two times a day (BID) | ORAL | Status: DC
  Administered 2011-08-13 – 2011-08-14 (×2): 30 mg via ORAL
  Filled 2011-08-13 (×3): qty 3

## 2011-08-13 MED ORDER — FUROSEMIDE 40 MG PO TABS
40.0000 mg | ORAL_TABLET | Freq: Two times a day (BID) | ORAL | Status: DC
  Administered 2011-08-13 – 2011-08-14 (×3): 40 mg via ORAL
  Filled 2011-08-13 (×6): qty 1

## 2011-08-13 MED ORDER — GI COCKTAIL ~~LOC~~
30.0000 mL | Freq: Two times a day (BID) | ORAL | Status: DC | PRN
  Filled 2011-08-13: qty 30

## 2011-08-13 NOTE — Progress Notes (Signed)
Inpatient Diabetes Program Recommendations  AACE/ADA: New Consensus Statement on Inpatient Glycemic Control (2009)  Target Ranges:  Prepandial:   less than 140 mg/dL      Peak postprandial:   less than 180 mg/dL (1-2 hours)      Critically ill patients:  140 - 180 mg/dL   Reason for Visit: Hyperglycemia  Inpatient Diabetes Program Recommendations Correction (SSI): Request MD consider adding Moderate scale Novolog correction HgbA1C: Request updated Hbg A1c.  Last available value dated 04/11/2011. Diet: If patient agreeable, recommend CHO modified medium restriction be added to current diet order.  Note: CBG's yesterday 112, 220, 191, 205.  Today 161, 205 so far.

## 2011-08-13 NOTE — Progress Notes (Addendum)
Advanced Heart Failure Rounding Note   Subjective:    [name] is a 56 year old female with history of breast cancer, obesity, HTN, diabetes, IBS, and OSA. She had chemo induced cardiomyopathy. Echo 2010 EF 30-35%. Cardiac MRI in July 2010 EF 45%. However she recovered her LV function with therapy.Myoview in 10/11 EF 59%. No ischemia.   She was admitted 1/6 for progressive dyspnea and volume overload.  She has been out of her digoxin, carvedilol, and ramipril for several weeks.  She has taken multiple extra doses of lasix as her weight progressively increased over the last couple of weeks.    Echo today EF 20-25% with global LF dysfunction and restrictive physiology. RV normal.  Sleeping with CPAP.  Resting SOB resolved. Has not ambulated this morning. No CP.  Mildly anxious this morning.  All medications have been restarted and tolerating well.  Cr/BUN elevated.   Down 1 lb.  Dry weight around 228.   Objective:    Vital Signs:   Temp:  [97.1 F (36.2 C)-97.6 F (36.4 C)] 97.1 F (36.2 C) (01/08 0514) Pulse Rate:  [74-118] 100  (01/08 0514) Resp:  [18] 18  (01/08 0514) BP: (89-110)/(54-69) 96/59 mmHg (01/08 0514) SpO2:  [94 %-99 %] 98 % (01/08 0514) Weight:  [102.7 kg (226 lb 6.6 oz)] 226 lb 6.6 oz (102.7 kg) (01/08 0514) Last BM Date: 08/08/11  24-hour weight change: Weight change: -2.534 kg (-5 lb 9.4 oz)  Intake/Output:   Intake/Output Summary (Last 24 hours) at 08/13/11 0742 Last data filed at 08/13/11 0517  Gross per 24 hour  Intake   1203 ml  Output   1552 ml  Net   -349 ml     Physical Exam: General:  Obese, NAD, O2 per N/C with no resp difficulty HEENT: normal Neck: supple. JVP 7-8. Carotids 2+ bilat; no bruits. No lymphadenopathy or thryomegaly appreciated. Cor: PMI nondisplaced. Tachy regular rate & rhythm. No rubs, gallops or murmurs. Lungs: clear Abdomen: soft, nontender, nondistended. No hepatosplenomegaly. No bruits or masses. Good bowel  sounds. Extremities: no cyanosis, clubbing, rash, edema Neuro: alert & orientedx3, cranial nerves grossly intact. moves all 4 extremities w/o difficulty. Affect pleasant  Telemetry: Sinus tach 110-120s  Labs: Basic Metabolic Panel:  Lab 08/13/11 1610 08/12/11 0550 08/11/11 2156 08/11/11 1542  NA 135 140 -- 136  K 4.8 3.3* -- 4.2  CL 97 99 -- 98  CO2 30 29 -- 25  GLUCOSE 165* 101* -- 199*  BUN 24* 12 -- 12  CREATININE 1.12* 0.66 0.69 0.57  CALCIUM 9.9 9.8 -- 9.9  MG -- -- -- --  PHOS -- -- -- --    Liver Function Tests:  Lab 08/11/11 1542  AST 24  ALT 15  ALKPHOS 76  BILITOT 0.5  PROT 7.3  ALBUMIN 3.2*   No results found for this basename: LIPASE:5,AMYLASE:5 in the last 168 hours No results found for this basename: AMMONIA:3 in the last 168 hours  CBC:  Lab 08/11/11 2156 08/11/11 1542  WBC 9.6 11.2*  NEUTROABS -- 8.3*  HGB 13.3 12.8  HCT 40.2 38.5  MCV 85.5 85.7  PLT 350 311    Cardiac Enzymes: No results found for this basename: CKTOTAL:5,CKMB:5,CKMBINDEX:5,TROPONINI:5 in the last 168 hours  BNP: No components found with this basename: POCBNP:5  CBG:  Lab 08/13/11 0559 08/12/11 2200 08/12/11 1625 08/12/11 1120 08/12/11 0632  GLUCAP 161* 205* 191* 220* 112*    Coagulation Studies: No results found for this basename: LABPROT:5,INR:5  in the last 72 hours  Other results:   Imaging: Dg Chest 2 View  08/11/2011  *RADIOLOGY REPORT*  Clinical Data: Shortness of breath.  Chest pain.  Hypertension. Diabetes.  Breast cancer.  Asthma.  COPD.  Ex-smoker.  CHEST - 2 VIEW  Comparison: 05/21/2011.  Findings: Poor inspiration.  Enlarged cardiac silhouette without gross change.  Stable prominence of the pulmonary vasculature and interstitial markings.  Cholecystectomy clips.  Unremarkable bones.  IMPRESSION: No gross change in cardiomegaly, pulmonary vascular congestion and chronic interstitial lung disease.  No acute abnormality.  Original Report Authenticated By:  Darrol Angel, M.D.     Medications:     Scheduled Medications:    . busPIRone  30 mg Oral BID  . carvedilol  12.5 mg Oral BID WC  . citalopram  20 mg Oral Daily  . digoxin  125 mcg Oral Daily  . furosemide  60 mg Intravenous BID  . heparin  5,000 Units Subcutaneous Q8H  . insulin aspart protamine-insulin aspart  100 Units Subcutaneous Q breakfast  . insulin aspart protamine-insulin aspart  125 Units Subcutaneous Q supper  . levothyroxine  75 mcg Oral Q0600  . omega-3 acid ethyl esters  4 capsule Oral Daily  . pantoprazole  40 mg Oral BID  . potassium chloride SA  60 mEq Oral BID  . ramipril  2.5 mg Oral BID  . rOPINIRole  1 mg Oral QHS  . rosuvastatin  20 mg Oral Daily  . sodium chloride  3 mL Intravenous Q12H  . spironolactone  25 mg Oral Daily  . tiotropium  18 mcg Inhalation Daily  . traZODone  100 mg Oral BID  . DISCONTD: albuterol  2 puff Inhalation 2 times weekly  . DISCONTD: carvedilol  25 mg Oral BID WC  . DISCONTD: insulin aspart protamine-insulin aspart  100 Units Subcutaneous Q breakfast  . DISCONTD: insulin aspart protamine-insulin aspart  225 Units Subcutaneous Q breakfast  . DISCONTD: insulin aspart protamine-insulin aspart  250 Units Subcutaneous Q supper    Infusions:    PRN Medications: sodium chloride, acetaminophen, albuterol, cyclobenzaprine, nitroGLYCERIN, ondansetron (ZOFRAN) IV, sodium chloride, traMADol   Assessment:   1) Acute on Chronic Systolic Heart Failure 2) Recurrent LV dysfunction      --NICM, EF 20-25%     - chemotherapy induced  3) DM 4) OSA     - on CPAP 5) Hypotension 6) Hypokalemia 7) Depression 8) Medical noncompliance   Plan/Discussion:    Diuresis slowed yesterday and Cr/BUN mildly elevated therefore will change IV lasix to po.  Symptomatically she has improved but feels she is still dyspneic with ambulation.  Has not walked today but will ambulate after breakfast and will reassess if she will be discharged today.    CSW discussed possible home health RN with patient and she declines at this time.  Again discussed importance of medication compliance.   Length of Stay: 2   Ulyess Blossom, PA-C  08/13/2011, 7:42 AM   Patient seen and examined. Reviewed with Ulyess Blossom PA-C. We discussed all aspects of the encounter. I agree with the assessment and plan as stated above. Much improved. Looks euvolemic and creatinine climbing a bit. Agree with switch to po lasix. Hopefully d/c in am.   Notes significant dyspnea with ambulating room unless she has O2 on. Will have PT assess need for home O2 in am prior to d/c  Truman Hayward 8:18 PM

## 2011-08-13 NOTE — Progress Notes (Signed)
PT says she will call RT later on when she is ready to go on her cpap machine like last night. RT will continue to monitor PT.

## 2011-08-14 LAB — BASIC METABOLIC PANEL
Calcium: 10.4 mg/dL (ref 8.4–10.5)
Creatinine, Ser: 0.82 mg/dL (ref 0.50–1.10)
GFR calc Af Amer: >90 mL/min (ref >90)

## 2011-08-14 NOTE — Discharge Summary (Signed)
Advanced Heart Failure Team Discharge Summary  Patient ID: [name] MRN: [id number] DOB/AGE: [birth year] 56 y.o.  Admit date: 08/11/2011 Discharge date: 08/14/2011  Primary Discharge Diagnosis 1) Acute on Chronic Systolic Heart Failure  2) Recurrent LV dysfunction      - NICM, EF 20-25%      - chemotherapy induced  3) DM  4) OSA       - on CPAP  5) Hypotension  6) Hypokalemia  7) Depression  8) Medical noncompliance   Secondary Discharge Diagnosis 1) Obesity 2) GERD 3) Asthma 4) OA 5) Anxiety 6) IBS  Significant Diagnostic Studies: (please see full reports) 1) 2D echo 08/12/11: Left ventricle: mildly dilated, EF 20% to 25%. Diffuse hypokinesis.  Doppler parameters are consistent with restrictive physiology, indicative of decreased left ventricular diastolic compliance and/or increased left atrial pressure. Left atrium was moderately dilated.  Right atrium mildly dilated. 2) CXR 08/11/11: No gross change in cardiomegaly, pulmonary vascular congestion and chronic interstitial lung disease. No acute abnormality.  Hospital Course:   Ms. Keng is a 56 y.o. female with history of breast cancer, obesity, HTN, diabetes, IBS, and OSA. She had chemo induced cardiomyopathy. Cardiac MRI in July 2010 EF 45%. Myoview in 10/11 EF 59%. No ischemia.  EF 20-25% per echo this admission.    She was admitted for progressive dyspnea and volume overload.  Upon further discussion she had a mishap at the pharmacy and has been out of her digoxin, carvedilol, and ramipril since December 18th.  She has been compliant with her diet.  She had taken multiple extra doses of lasix over this time period without much relief.  She was c/o dyspnea at rest and with minimal exertion and orthopnea.  She presented to the [facility] ER for further evaluation and was admitted for diuresis.   On admission weight was 232 lbs and she has diuresed 6 lbs, discharge weight 226.  IV lasix was needed for 24 hours and then  transitioned back to po lasix.  She has had improvement in dyspnea at rest and orthopnea.  She is still with complaints of dyspnea with exertion.  Patient walked 300 feet and O2 sat stayed above 91%.  Will not require home O2 at this time.   Social work was asked to evaluate the patient for possible home health and the patient declined at this time due to 4 dogs who are not friendly to people.     Discharge Info: Blood pressure 101/65, pulse 94, temperature 97.1 F (36.2 C), temperature source Oral, resp. rate 18, height 5\' 5"  (1.651 m), weight 102.9 kg (226 lb 13.7 oz), SpO2 98.00%.   Weight change: 0.2 kg (7.1 oz) (down 6 lbs since admit)  Physical Exam:  General: Obese, NAD, O2 per N/C with no resp difficulty  HEENT: normal  Neck: supple. JVP 7-8. Carotids 2+ bilat; no bruits. No lymphadenopathy or thryomegaly appreciated.  Cor: PMI nondisplaced. Tachy regular rate & rhythm. No rubs, gallops or murmurs.  Lungs: clear  Abdomen: soft, nontender, nondistended. No hepatosplenomegaly. No bruits or masses. Good bowel sounds.  Extremities: no cyanosis, clubbing, rash, edema  Neuro: alert & orientedx3, cranial nerves grossly intact. moves all 4 extremities w/o difficulty. Affect pleasant  Tele: Sinus tach 100s, occasional PVCs  Results for orders placed during the hospital encounter of 08/11/11 (from the past 24 hour(s))  GLUCOSE, CAPILLARY     Status: Abnormal   Collection Time   08/13/11  4:14 PM      Component  Value Range   Glucose-Capillary 174 (*) 70 - 99 (mg/dL)   Comment 1 Documented in Chart     Comment 2 Notify RN    GLUCOSE, CAPILLARY     Status: Abnormal   Collection Time   08/13/11  9:33 PM      Component Value Range   Glucose-Capillary 150 (*) 70 - 99 (mg/dL)   Comment 1 Notify RN    BASIC METABOLIC PANEL     Status: Abnormal   Collection Time   08/14/11  5:37 AM      Component Value Range   Sodium 137  135 - 145 (mEq/L)   Potassium 4.4  3.5 - 5.1 (mEq/L)   Chloride 97   96 - 112 (mEq/L)   CO2 31  19 - 32 (mEq/L)   Glucose, Bld 132 (*) 70 - 99 (mg/dL)   BUN 20  6 - 23 (mg/dL)   Creatinine, Ser 1.61  0.50 - 1.10 (mg/dL)   Calcium 09.6  8.4 - 10.5 (mg/dL)   GFR calc non Af Amer 79 (*) >90 (mL/min)   GFR calc Af Amer >90  >90 (mL/min)  GLUCOSE, CAPILLARY     Status: Abnormal   Collection Time   08/14/11  7:15 AM      Component Value Range   Glucose-Capillary 150 (*) 70 - 99 (mg/dL)  GLUCOSE, CAPILLARY     Status: Abnormal   Collection Time   08/14/11 11:53 AM      Component Value Range   Glucose-Capillary 168 (*) 70 - 99 (mg/dL)     Discharge Medications: Current Discharge Medication List    CONTINUE these medications which have NOT CHANGED   Details  acetaminophen (TYLENOL ARTHRITIS PAIN) 650 MG CR tablet Take 1,300 mg by mouth every 8 (eight) hours as needed. pain    albuterol (PROVENTIL HFA) 108 (90 BASE) MCG/ACT inhaler Inhale 2 puffs into the lungs 2 (two) times a week.     atorvastatin (LIPITOR) 40 MG tablet Take 40 mg by mouth daily.      busPIRone (BUSPAR) 30 MG tablet Take 30 mg by mouth 2 (two) times daily.      carvedilol (COREG) 25 MG tablet Take 1 tablet (25 mg total) by mouth 2 (two) times daily with a meal. Qty: 60 tablet, Refills: 2    citalopram (CELEXA) 20 MG tablet 2 tablets by mouth once daily     cyclobenzaprine (FLEXERIL) 10 MG tablet Take 1 tablet (10 mg total) by mouth 2 (two) times daily as needed. Qty: 60 tablet, Refills: 5    digoxin (LANOXIN) 0.125 MG tablet Take 1 tablet (125 mcg total) by mouth daily. Qty: 30 tablet, Refills: 2    diphenhydramine-acetaminophen (TYLENOL PM) 25-500 MG TABS Take 1 tablet by mouth at bedtime as needed.      furosemide (LASIX) 20 MG tablet Take 40 mg by mouth 2 (two) times daily. Can take up to 60 mg if needed.     Glucosamine 500 MG CAPS Take 1 capsule by mouth daily.      insulin lispro protamine-insulin lispro (HUMALOG 75/25) (75-25) 100 UNIT/ML SUSP 225 units with breakfast,  and 250 units with the evening meal.    levothyroxine (SYNTHROID, LEVOTHROID) 75 MCG tablet Take 75 mcg by mouth daily.      loperamide (IMODIUM A-D) 2 MG tablet Take 2 mg by mouth as needed. diarrhea    mometasone (NASONEX) 50 MCG/ACT nasal spray Place 2 sprays into the nose daily as needed. allergies  nitroGLYCERIN (NITROSTAT) 0.4 MG SL tablet Place 1 tablet (0.4 mg total) under the tongue as needed. Qty: 25 tablet, Refills: 6    omega-3 acid ethyl esters (LOVAZA) 1 G capsule Take 4 capsules by mouth daily.      pantoprazole (PROTONIX) 40 MG tablet Take 40 mg by mouth 2 (two) times daily.      potassium chloride SA (K-DUR,KLOR-CON) 20 MEQ tablet Take 60 mEq by mouth 2 (two) times daily.     ramipril (ALTACE) 2.5 MG tablet Take 2.5 mg by mouth 2 (two) times daily.      rOPINIRole (REQUIP) 1 MG tablet Take 1 mg by mouth at bedtime.      spironolactone (ALDACTONE) 25 MG tablet Take 1 tablet (25 mg total) by mouth daily. Qty: 30 tablet, Refills: 11    tiotropium (SPIRIVA) 18 MCG inhalation capsule Place 18 mcg into inhaler and inhale daily.      traMADol (ULTRAM) 50 MG tablet Take 50 mg by mouth every 6 (six) hours as needed. pain    traZODone (DESYREL) 50 MG tablet Take 100 mg by mouth 2 (two) times daily. Can take extra tablet if needed.    Insulin Pen Needle (B-D ULTRAFINE III SHORT PEN) 31G X 8 MM MISC 18/day as directed dx 250.00 Qty: 500 each, Refills: 11        Follow-up Plans & Instructions: Discharge Orders    Future Appointments: Provider: Department: Dept Phone: Center:   08/19/2011 7:45 AM Romero Belling, MD [facility] [phone] [facility]   08/22/2011 10:45 AM [facility] [phone] None     Future Orders Please Complete By Expires   Diet - low sodium heart healthy      Increase activity slowly      Heart Failure patients record your daily weight using the same scale at the same time of day      (HEART FAILURE PATIENTS) Call MD:  Anytime  you have any of the following symptoms: 1) 3 pound weight gain in 24 hours or 5 pounds in 1 week 2) shortness of breath, with or without a dry hacking cough 3) swelling in the hands, feet or stomach 4) if you have to sleep on extra pillows at night in order to breathe.      Beta Blocker already ordered      ACE Inhibitor / ARB already ordered        Follow-up Information    Follow up with Arvilla Meres, MD on 08/22/2011. (at 10:45 a    [employer] code [phone]))    Contact information:   [facility] [phone]           BRING ALL MEDICATIONS WITH YOU TO FOLLOW UP APPOINTMENTS  Time spent with patient to include physician time: Greater than 40 minutes  Signed:  Robbi Garter, PA 08/14/2011, 1:08 PM  Patient seen and examined with Ulyess Blossom PA-C. We discussed all aspects of the encounter. I agree with the assessment and plan as stated above. We walked her down the hall. Mild to moderate dyspnea. No lightheadedness. O2 sat 97% -> 91% on RA. Agree with d/c today on current meds. F/u in HF clinic next week.   Jettie Lazare,MD 1:13 PM

## 2011-08-14 NOTE — Plan of Care (Signed)
Problem: Food- and Nutrition-Related Knowledge Deficit (NB-1.1) Goal: Nutrition education Formal process to instruct or train a patient/client in a skill or to impart knowledge to help patients/clients voluntarily manage or modify food choices and eating behavior to maintain or improve health.  Outcome: Completed/Met Date Met:  08/14/11 RD pulled to patient from HF rounds. Patient follows a low sodium diet at home. Patient is aware of most high sodium foods to avoid and controls carbohydrate intake. RD answered questions about specified foods. Patient states that this episode was related to non-compliance with medications and not from eating high sodium foods. Patient had no more questions at this time. RD left patient with [employer] hand out of low/high sodium foods. Chart reviewed, no other nutrition interventions at this time.   Clarene Duke MARIE [phone]

## 2011-08-14 NOTE — Progress Notes (Signed)
Evaluation canceled today due to MD/PA have ambulated pt with O2 and have the answers they need prior to D/C.  No f/u needs.  08/14/2011  Melvin Bing, PT [phone] (pager)

## 2011-08-16 ENCOUNTER — Telehealth (HOSPITAL_COMMUNITY): Payer: Self-pay | Admitting: *Deleted

## 2011-08-16 NOTE — Telephone Encounter (Signed)
error 

## 2011-08-19 ENCOUNTER — Telehealth (HOSPITAL_COMMUNITY): Payer: Self-pay | Admitting: *Deleted

## 2011-08-19 ENCOUNTER — Ambulatory Visit: Payer: Medicare Other | Admitting: Endocrinology

## 2011-08-19 ENCOUNTER — Telehealth: Payer: Self-pay

## 2011-08-19 NOTE — Telephone Encounter (Signed)
Pt states that in the hospital she was advised to take 100 units in the morning and 125 units in the evening. She has since been taking 65 units bid and sugar has been running in the low 100's since d/c from hospital.

## 2011-08-19 NOTE — Telephone Encounter (Signed)
Please verify current insulin dosage is 225 am and 250 pm If so, reduce to 200-bid

## 2011-08-19 NOTE — Telephone Encounter (Signed)
Pt called stating she has experiencing a few episodes of low blood sugars since D/C from hospital. Most recent to ;CBG was 55, pt was sweating excessively, weak and shaking. Pt is requesting to adjust her insulin based on her CBG reading, please advise.  Last 167 at 2 pm with no insulin since episode this morning.

## 2011-08-19 NOTE — Telephone Encounter (Signed)
Reduce to 50-bid

## 2011-08-19 NOTE — Telephone Encounter (Signed)
Received alert via fax from [employer] that pt's wt was up 4 lbs on 1/11, called to check on pt she states that Fri wt was up 4 lbs and Sat it was the same so she did take extra lasix as directed when she left the hospital, Sun her wt was down 1 lb, today she states she has not gotten up yet because she awoke feeling bad and her blood sugar was 50, her brother has brought her something to raise it and she is just still resting, she states that she is planning to take extra lasix today and will call me tomorrow if wt not down, she has an appt with [place] on Thur.

## 2011-08-20 NOTE — Telephone Encounter (Signed)
Pt informed of MD's advisement. 

## 2011-08-22 ENCOUNTER — Ambulatory Visit (HOSPITAL_COMMUNITY)
Admission: RE | Admit: 2011-08-22 | Discharge: 2011-08-22 | Disposition: A | Discharge status: Home / Self Care | Payer: Medicare Other | Source: Ambulatory Visit | Attending: Internal Medicine | Admitting: Internal Medicine

## 2011-08-22 VITALS — BP 100/60 | HR 102 | Wt 226.8 lb

## 2011-08-22 DIAGNOSIS — I5022 Chronic systolic (congestive) heart failure: Secondary | ICD-10-CM | POA: Insufficient documentation

## 2011-08-22 LAB — BASIC METABOLIC PANEL
BUN: 15 mg/dL (ref 6–23)
CO2: 32 mEq/L (ref 19–32)
Chloride: 100 mEq/L (ref 96–112)
GFR calc non Af Amer: >90 mL/min (ref >90)
Glucose, Bld: 152 mg/dL — ABNORMAL HIGH (ref 70–99)
Potassium: 4.7 mEq/L (ref 3.5–5.1)
Sodium: 140 mEq/L (ref 135–145)

## 2011-08-22 NOTE — Patient Instructions (Addendum)
Take Lasix 60 mg in am 40 mg in pm.   Do the following things EVERYDAY: 1) Weigh yourself in the morning before breakfast. Write it down and keep it in a log. 2) Take your medicines as prescribed 3) Eat low salt foods-Limit salt (sodium) to 2000mg  per day.  4) Stay as active as you can everyday   Please obtain BMET in 2 weeks and fax results [phone]  Follow in 4 weeks.

## 2011-08-22 NOTE — Progress Notes (Signed)
Patient ID: [name], female   DOB: 1956/01/20, 56 y.o.   MRN: [id number]  HPI: 56 year old female with history of breast cancer, obesity, HTN, diabetes, IBS, and OSA. She had chemo induced cardiomyopathy. Cardiac MRI in July 2010 EF 45%. Myoview in 10/11 EF 59%. No ischemia.   Admitted earlier this month with ADHF after she had run out medications for 2  Weeks. She responded will to IV diuretics. Discharge weiight 226 . Discharged on Lasix 40 mg bid.    2D echo 08/12/11: Left ventricle: mildly dilated, EF 20% to 25%. Diffuse hypokinesis. Doppler parameters are consistent with restrictive physiology, indicative ofdecreased left ventricular diastolic compliance and/or increased left atrial pressure. Left atrium was moderately dilated. Right atrium mildly dilated.   She is here for follow up post hopsitalization. Weight at home 222 -226. She has been taking Lasix 60 mg in am and 40 mg in pm. She increased am Lasix because she felt like she had fluid. Occasional dizziness. Denies SOB/PND/Orthopnea. Continues to uses CPAP. Started walking daily unless it is raining.  Taking all medications. Following low salt diet. Chronic lower extremity edema.    ROS: All systems negative except as listed in HPI, PMH and Problem List.  Past Medical History  Diagnosis Date  . 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 DYSLIPIDEMIA 05/01/2009  . OBESITY 07/24/2007  . OBSTRUCTIVE SLEEP APNEA 07/24/2007    with CPAP compliance  . RESTLESS LEG SYNDROME 07/24/2007  . SYSTOLIC HEART FAILURE, CHRONIC 01/05/2009  . TACHYCARDIA 10/25/2008  . Palpitations 09/20/2010  . 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 Hypertension   . 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 Previous EF 25%. ECHO 6/08 EF 55% ECHO 5/10 EF 30-35% MRI 7/10 EF 45% Myoview 10/11 59% no ischemia  . History of breast cancer 2006    with return in 2008  . Osteoarthritis   . Anxiety   .  Depression     followed by a psychiatrist  . Asthma   . GERD (gastroesophageal reflux disease)   . Gastroparesis   . IBS (irritable bowel syndrome)     Current Outpatient Prescriptions  Medication Sig Dispense Refill  . acetaminophen (TYLENOL ARTHRITIS PAIN) 650 MG CR tablet Take 1,300 mg by mouth every 8 (eight) hours as needed. pain      . albuterol (PROVENTIL HFA) 108 (90 BASE) MCG/ACT inhaler Inhale 2 puffs into the lungs 2 (two) times a week.       Marland Kitchen atorvastatin (LIPITOR) 40 MG tablet Take 40 mg by mouth daily.        . busPIRone (BUSPAR) 30 MG tablet Take 30 mg by mouth 2 (two) times daily.        . carvedilol (COREG) 25 MG tablet Take 1 tablet (25 mg total) by mouth 2 (two) times daily with a meal.  60 tablet  2  . citalopram (CELEXA) 20 MG tablet 2 tablets by mouth once daily       . cyclobenzaprine (FLEXERIL) 10 MG tablet Take 1 tablet (10 mg total) by mouth 2 (two) times daily as needed.  60 tablet  5  . digoxin (LANOXIN) 0.125 MG tablet Take 1 tablet (125 mcg total) by mouth daily.  30 tablet  2  . diphenhydramine-acetaminophen (TYLENOL PM) 25-500 MG TABS Take 1 tablet by mouth at bedtime as needed.        . furosemide (LASIX) 20 MG tablet Take 40  mg by mouth 2 (two) times daily. Can take up to 60 mg if needed.       . Glucosamine 500 MG CAPS Take 1 capsule by mouth daily.        . insulin lispro protamine-insulin lispro (HUMALOG 75/25) (75-25) 100 UNIT/ML SUSP 225 units with breakfast, and 250 units with the evening meal.      . Insulin Pen Needle (B-D ULTRAFINE III SHORT PEN) 31G X 8 MM MISC 18/day as directed dx 250.00  500 each  11  . levothyroxine (SYNTHROID, LEVOTHROID) 75 MCG tablet Take 75 mcg by mouth daily.        Marland Kitchen loperamide (IMODIUM A-D) 2 MG tablet Take 2 mg by mouth as needed. diarrhea      . mometasone (NASONEX) 50 MCG/ACT nasal spray Place 2 sprays into the nose daily as needed. allergies      . nitroGLYCERIN (NITROSTAT) 0.4 MG SL tablet Place 1 tablet (0.4 mg  total) under the tongue as needed.  25 tablet  6  . omega-3 acid ethyl esters (LOVAZA) 1 G capsule Take 4 capsules by mouth daily.        . pantoprazole (PROTONIX) 40 MG tablet Take 40 mg by mouth 2 (two) times daily.        . potassium chloride SA (K-DUR,KLOR-CON) 20 MEQ tablet Take 60 mEq by mouth 2 (two) times daily.       . ramipril (ALTACE) 2.5 MG tablet Take 2.5 mg by mouth 2 (two) times daily.        Marland Kitchen rOPINIRole (REQUIP) 1 MG tablet Take 1 mg by mouth at bedtime.        Marland Kitchen spironolactone (ALDACTONE) 25 MG tablet Take 1 tablet (25 mg total) by mouth daily.  30 tablet  11  . tiotropium (SPIRIVA) 18 MCG inhalation capsule Place 18 mcg into inhaler and inhale daily.        . traMADol (ULTRAM) 50 MG tablet Take 50 mg by mouth every 6 (six) hours as needed. pain      . traZODone (DESYREL) 50 MG tablet Take 100 mg by mouth 2 (two) times daily. Can take extra tablet if needed.         PHYSICAL EXAM: Filed Vitals:   08/22/11 1135  BP: 100/60  Pulse: 102  Weight  226 (229) Mom present  General:  Well appearing. No resp difficulty HEENT: normal Neck: supple. JVP 6 Carotids 2+ bilaterally; no bruits. No lymphadenopathy or thryomegaly appreciated. Cor: PMI normal. Regular rate & rhythm. No rubs, soft  S3  or murmurs. Lungs: clear Abdomen: soft, obese, nontender,distended No hepatosplenomegaly. No bruits or masses. Good bowel sounds. Extremities: no cyanosis, clubbing, rash, tr edema Neuro: alert & orientedx3, cranial nerves grossly intact. Moves all 4 extremities w/o difficulty. Affect pleasant.    ASSESSMENT & PLAN:

## 2011-08-22 NOTE — Assessment & Plan Note (Addendum)
Doing pretty well. NYHA III. Volume status minimally elevated. Tolerating medications however I am reluctant to increase Ace Inhibitior. Continue Lasix 60 mg in am and 40 mg in pm. Check BMET today. Re-educated to follow low salt diet, take all medications as ordered, and to call [facility] if she has any difficulty obtaining medications. Follow up in 3 weeks  Patient seen and examined with Tonye Becket, NP. We discussed all aspects of the encounter. I agree with the assessment and plan as stated above. Stressed need for dietary compliance.

## 2011-09-04 ENCOUNTER — Encounter: Payer: Self-pay | Admitting: Endocrinology

## 2011-09-04 ENCOUNTER — Ambulatory Visit (INDEPENDENT_AMBULATORY_CARE_PROVIDER_SITE_OTHER): Payer: Medicare Other | Admitting: Endocrinology

## 2011-09-04 VITALS — BP 100/64 | HR 106 | Temp 97.7°F | Ht 68.0 in | Wt 228.4 lb

## 2011-09-04 DIAGNOSIS — E119 Type 2 diabetes mellitus without complications: Secondary | ICD-10-CM

## 2011-09-04 NOTE — Progress Notes (Signed)
Subjective:    Patient ID: [name], female    DOB: Jan 25, 1956, 56 y.o.   MRN: [id number]  HPI Pt returns for f/u of insulin-requiring DM (2007), characterized by severe insulin resistance.  pt states she feels much better in general.  she brings a record of her cbg's which i have reviewed today.  She had mild hypoglycemia only for a few days after her recent hospitalization for chf.  She called here, and insulin was decreased to 50-bid.  Since then, cbg's have increased to approx 200 .  There is no trend throughout the day.  Past Medical History  Diagnosis Date  . 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 DYSLIPIDEMIA 05/01/2009  . OBESITY 07/24/2007  . OBSTRUCTIVE SLEEP APNEA 07/24/2007    with CPAP compliance  . RESTLESS LEG SYNDROME 07/24/2007  . SYSTOLIC HEART FAILURE, CHRONIC 01/05/2009  . TACHYCARDIA 10/25/2008  . Palpitations 09/20/2010  . 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 Hypertension   . 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 Previous EF 25%. ECHO 6/08 EF 55% ECHO 5/10 EF 30-35% MRI 7/10 EF 45% Myoview 10/11 59% no ischemia  . History of breast cancer 2006    with return in 2008  . Osteoarthritis   . Anxiety   . Depression     followed by a psychiatrist  . Asthma   . GERD (gastroesophageal reflux disease)   . Gastroparesis   . IBS (irritable bowel syndrome)     Past Surgical History  Procedure Date  . Mastectomy     right  . Abdominal hysterectomy 1991    History   Social History  . Marital Status: Divorced    Spouse Name: N/A    Number of Children: N/A  . Years of Education: N/A   Occupational History  . Disabled    Social History Main Topics  . Smoking status: Former Games developer  . Smokeless tobacco: Former Neurosurgeon    Quit date: 03/05/2001  . Alcohol Use: No  . Drug Use: No  . Sexually Active: Not on file   Other Topics Concern  . Not on file   Social History Narrative   Lives with  mother and brotherRegular exercise-no    Current Outpatient Prescriptions on File Prior to Visit  Medication Sig Dispense Refill  . acetaminophen (TYLENOL ARTHRITIS PAIN) 650 MG CR tablet Take 1,300 mg by mouth every 8 (eight) hours as needed. pain      . albuterol (PROVENTIL HFA) 108 (90 BASE) MCG/ACT inhaler Inhale 2 puffs into the lungs 2 (two) times a week.       Marland Kitchen atorvastatin (LIPITOR) 40 MG tablet Take 40 mg by mouth daily.        . busPIRone (BUSPAR) 30 MG tablet Take 30 mg by mouth 2 (two) times daily.        . carvedilol (COREG) 25 MG tablet Take 1 tablet (25 mg total) by mouth 2 (two) times daily with a meal.  60 tablet  2  . citalopram (CELEXA) 20 MG tablet 2 tablets by mouth once daily       . cyclobenzaprine (FLEXERIL) 10 MG tablet Take 1 tablet (10 mg total) by mouth 2 (two) times daily as needed.  60 tablet  5  . digoxin (LANOXIN) 0.125 MG tablet Take 1 tablet (125 mcg total) by mouth daily.  30 tablet  2  . diphenhydramine-acetaminophen (TYLENOL PM) 25-500 MG TABS Take 1 tablet  by mouth at bedtime as needed.        . furosemide (LASIX) 20 MG tablet Take 40 mg by mouth 2 (two) times daily. Can take up to 60 mg if needed.       . Glucosamine 500 MG CAPS Take 1 capsule by mouth daily.        . insulin lispro protamine-insulin lispro (HUMALOG 75/25) (75-25) 100 UNIT/ML SUSP 80 units with breakfast, and 80 units with the evening meal.      . Insulin Pen Needle (B-D ULTRAFINE III SHORT PEN) 31G X 8 MM MISC 18/day as directed dx 250.00  500 each  11  . levothyroxine (SYNTHROID, LEVOTHROID) 75 MCG tablet Take 75 mcg by mouth daily.        Marland Kitchen loperamide (IMODIUM A-D) 2 MG tablet Take 2 mg by mouth as needed. diarrhea      . mometasone (NASONEX) 50 MCG/ACT nasal spray Place 2 sprays into the nose daily as needed. allergies      . nitroGLYCERIN (NITROSTAT) 0.4 MG SL tablet Place 1 tablet (0.4 mg total) under the tongue as needed.  25 tablet  6  . omega-3 acid ethyl esters (LOVAZA) 1 G  capsule Take 4 capsules by mouth daily.        . pantoprazole (PROTONIX) 40 MG tablet Take 40 mg by mouth 2 (two) times daily.        . potassium chloride SA (K-DUR,KLOR-CON) 20 MEQ tablet Take 60 mEq by mouth 2 (two) times daily.       . ramipril (ALTACE) 2.5 MG tablet Take 2.5 mg by mouth 2 (two) times daily.        Marland Kitchen rOPINIRole (REQUIP) 1 MG tablet Take 1 mg by mouth at bedtime.        Marland Kitchen spironolactone (ALDACTONE) 25 MG tablet Take 1 tablet (25 mg total) by mouth daily.  30 tablet  11  . SUMAtriptan (IMITREX) 25 MG tablet Take 25 mg by mouth every 2 (two) hours as needed.      . tiotropium (SPIRIVA) 18 MCG inhalation capsule Place 18 mcg into inhaler and inhale daily.        . traMADol (ULTRAM) 50 MG tablet Take 50 mg by mouth every 6 (six) hours as needed. pain      . traZODone (DESYREL) 50 MG tablet Take 100 mg by mouth 2 (two) times daily. Can take extra tablet if needed.        Allergies  Allergen Reactions  . Aspirin Hives  . Ciprofloxacin Hives  . Codeine Nausea Only  . Naproxen Hives  . Sulfonamide Derivatives Hives  . Tape Rash    Also allergic to metal    Family History  Problem Relation Age of Onset  . Diabetes Mother   . Hypertension Other   . 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  she needs increased rx, now that she is recovering from her recent illness.

## 2011-09-04 NOTE — Patient Instructions (Addendum)
increase humalog 75/25, back up to 90 units 2x a day (with 1st and last meals of the day).   Please make a follow-up appointment in 1 month. check your blood sugar 2 times a day.  vary the time of day when you check, between before the 3 meals, and at bedtime.  also check if you have symptoms of your blood sugar being too high or too low.  please keep a record of the readings and bring it to your next appointment here.  please call us sooner if you are having low blood sugar episodes, or if it stays over 200.

## 2011-09-19 ENCOUNTER — Ambulatory Visit (HOSPITAL_COMMUNITY): Payer: Medicare Other

## 2011-10-04 ENCOUNTER — Ambulatory Visit: Payer: Medicare Other | Admitting: Endocrinology

## 2011-10-11 ENCOUNTER — Other Ambulatory Visit: Payer: Self-pay

## 2011-10-11 MED ORDER — DIGOXIN 125 MCG PO TABS: 125.0000 ug | ORAL_TABLET | Freq: Every day | ORAL | Status: DC

## 2011-10-11 MED ORDER — CARVEDILOL 25 MG PO TABS: 25.0000 mg | ORAL_TABLET | Freq: Two times a day (BID) | ORAL | Status: DC

## 2011-10-16 ENCOUNTER — Encounter: Payer: Self-pay | Admitting: Endocrinology

## 2011-10-16 ENCOUNTER — Ambulatory Visit (INDEPENDENT_AMBULATORY_CARE_PROVIDER_SITE_OTHER): Payer: Medicare Other | Admitting: Endocrinology

## 2011-10-16 VITALS — BP 82/56 | HR 99 | Temp 97.3°F | Ht 68.0 in | Wt 229.0 lb

## 2011-10-16 DIAGNOSIS — E119 Type 2 diabetes mellitus without complications: Secondary | ICD-10-CM

## 2011-10-16 MED ORDER — CARVEDILOL 12.5 MG PO TABS: 12.5000 mg | ORAL_TABLET | Freq: Two times a day (BID) | ORAL | Status: DC

## 2011-10-16 NOTE — Patient Instructions (Addendum)
continue humalog 75/25 at 90 units 2x a day (with 1st and last meals of the day).   Please make a follow-up appointment in 3 months.   check your blood sugar 2 times a day.  vary the time of day when you check, between before the 3 meals, and at bedtime.  also check if you have symptoms of your blood sugar being too high or too low.  please keep a record of the readings and bring it to your next appointment here.  please call us sooner if you are having low blood sugar episodes, or if it stays over 200.   Skip 1 dose of carvedilol, then resume at 12.5 mg 2x a day.

## 2011-10-16 NOTE — Progress Notes (Signed)
Subjective:    Patient ID: [name], female    DOB: 01-20-1956, 56 y.o.   MRN: [id number]  HPI Pt returns for f/u of insulin-requiring DM (2007).  no cbg record, but states cbg was mildly low in the afternoon, once (78).   Pt states few days of slight dizziness, but no assoc loc.   Past Medical History  Diagnosis Date  . 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 DYSLIPIDEMIA 05/01/2009  . OBESITY 07/24/2007  . OBSTRUCTIVE SLEEP APNEA 07/24/2007    with CPAP compliance  . RESTLESS LEG SYNDROME 07/24/2007  . SYSTOLIC HEART FAILURE, CHRONIC 01/05/2009  . TACHYCARDIA 10/25/2008  . Palpitations 09/20/2010  . 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 Hypertension   . 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 Previous EF 25%. ECHO 6/08 EF 55% ECHO 5/10 EF 30-35% MRI 7/10 EF 45% Myoview 10/11 59% no ischemia  . History of breast cancer 2006    with return in 2008  . Osteoarthritis   . Anxiety   . Depression     followed by a psychiatrist  . Asthma   . GERD (gastroesophageal reflux disease)   . Gastroparesis   . IBS (irritable bowel syndrome)     Past Surgical History  Procedure Date  . Mastectomy     right  . Abdominal hysterectomy 1991    History   Social History  . Marital Status: Divorced    Spouse Name: N/A    Number of Children: N/A  . Years of Education: N/A   Occupational History  . Disabled    Social History Main Topics  . Smoking status: Former Games developer  . Smokeless tobacco: Former Neurosurgeon    Quit date: 03/05/2001  . Alcohol Use: No  . Drug Use: No  . Sexually Active: Not on file   Other Topics Concern  . Not on file   Social History Narrative   Lives with mother and brotherRegular exercise-no    Current Outpatient Prescriptions on File Prior to Visit  Medication Sig Dispense Refill  . acetaminophen (TYLENOL ARTHRITIS PAIN) 650 MG CR tablet Take 1,300 mg by mouth every 8 (eight) hours as needed.  pain      . albuterol (PROVENTIL HFA) 108 (90 BASE) MCG/ACT inhaler Inhale 2 puffs into the lungs 2 (two) times a week.       Marland Kitchen atorvastatin (LIPITOR) 40 MG tablet Take 40 mg by mouth daily.        . busPIRone (BUSPAR) 30 MG tablet Take 30 mg by mouth 2 (two) times daily.        . citalopram (CELEXA) 20 MG tablet 2 tablets by mouth once daily       . cyclobenzaprine (FLEXERIL) 10 MG tablet Take 1 tablet (10 mg total) by mouth 2 (two) times daily as needed.  60 tablet  5  . digoxin (LANOXIN) 0.125 MG tablet Take 1 tablet (125 mcg total) by mouth daily.  30 tablet  1  . diphenhydramine-acetaminophen (TYLENOL PM) 25-500 MG TABS Take 1 tablet by mouth at bedtime as needed.        . furosemide (LASIX) 20 MG tablet Take 40 mg by mouth 2 (two) times daily. Can take up to 60 mg if needed.       . Glucosamine 500 MG CAPS Take 1 capsule by mouth daily.        . insulin lispro protamine-insulin lispro (HUMALOG 75/25) (75-25) 100  UNIT/ML SUSP 80 units with breakfast, and 80 units with the evening meal.      . Insulin Pen Needle (B-D ULTRAFINE III SHORT PEN) 31G X 8 MM MISC 18/day as directed dx 250.00  500 each  11  . levothyroxine (SYNTHROID, LEVOTHROID) 75 MCG tablet Take 75 mcg by mouth daily.        Marland Kitchen loperamide (IMODIUM A-D) 2 MG tablet Take 2 mg by mouth as needed. diarrhea      . mometasone (NASONEX) 50 MCG/ACT nasal spray Place 2 sprays into the nose daily as needed. allergies      . nitroGLYCERIN (NITROSTAT) 0.4 MG SL tablet Place 1 tablet (0.4 mg total) under the tongue as needed.  25 tablet  6  . omega-3 acid ethyl esters (LOVAZA) 1 G capsule Take 4 capsules by mouth daily.        . pantoprazole (PROTONIX) 40 MG tablet Take 40 mg by mouth 2 (two) times daily.        . potassium chloride SA (K-DUR,KLOR-CON) 20 MEQ tablet Take 60 mEq by mouth 2 (two) times daily.       . ramipril (ALTACE) 2.5 MG tablet Take 2.5 mg by mouth 2 (two) times daily.        Marland Kitchen rOPINIRole (REQUIP) 1 MG tablet Take 1 mg by  mouth at bedtime.        Marland Kitchen spironolactone (ALDACTONE) 25 MG tablet Take 1 tablet (25 mg total) by mouth daily.  30 tablet  11  . SUMAtriptan (IMITREX) 25 MG tablet Take 25 mg by mouth every 2 (two) hours as needed.      . tiotropium (SPIRIVA) 18 MCG inhalation capsule Place 18 mcg into inhaler and inhale daily.        . traMADol (ULTRAM) 50 MG tablet Take 50 mg by mouth every 6 (six) hours as needed. pain      . traZODone (DESYREL) 50 MG tablet Take 100 mg by mouth 2 (two) times daily. Can take extra tablet if needed.        Allergies  Allergen Reactions  . Aspirin Hives  . Ciprofloxacin Hives  . Codeine Nausea Only  . Naproxen Hives  . Sulfonamide Derivatives Hives  . Tape Rash    Also allergic to metal    Family History  Problem Relation Age of Onset  . Diabetes Mother   . Hypertension Other   . Coronary artery disease Father 61    BP 82/56  Pulse 99  Temp(Src) 97.3 F (36.3 C) (Oral)  Ht 5\' 8"  (1.727 m)  Wt 229 lb (103.874 kg)  BMI 34.82 kg/m2  SpO2 95%    Review of Systems Denies loc and weight change.    Objective:   Physical Exam VITAL SIGNS:  See vs page GENERAL: no distress Pulses: dorsalis pedis intact bilat.   Feet: no deformity.  no ulcer on the feet.  feet are of normal color and temp.  no edema.  There is bilateral onychomycosis Neuro: sensation is intact to touch on the feet.     outside test results are reviewed: A1c=8.7, January, 2013.        Assessment & Plan:  DM, improved.  This a1c dos not reflect recent improvement of cbg's.   HTN, overcontrolled

## 2011-10-28 ENCOUNTER — Other Ambulatory Visit: Payer: Self-pay | Admitting: *Deleted

## 2011-10-28 MED ORDER — CYCLOBENZAPRINE HCL 10 MG PO TABS: 10.0000 mg | ORAL_TABLET | Freq: Two times a day (BID) | ORAL | Status: DC | PRN

## 2011-10-28 NOTE — Telephone Encounter (Signed)
R'cd fax from [facility] for refill of Flexeril

## 2011-11-04 ENCOUNTER — Ambulatory Visit (HOSPITAL_COMMUNITY)
Admission: RE | Admit: 2011-11-04 | Discharge: 2011-11-04 | Disposition: A | Discharge status: Home / Self Care | Payer: Medicare Other | Source: Ambulatory Visit | Attending: Internal Medicine | Admitting: Internal Medicine

## 2011-11-04 VITALS — BP 72/40 | HR 96 | Wt 234.0 lb

## 2011-11-04 DIAGNOSIS — I959 Hypotension, unspecified: Secondary | ICD-10-CM | POA: Insufficient documentation

## 2011-11-04 DIAGNOSIS — I5022 Chronic systolic (congestive) heart failure: Secondary | ICD-10-CM | POA: Insufficient documentation

## 2011-11-04 LAB — BASIC METABOLIC PANEL
BUN: 11 mg/dL (ref 6–23)
CO2: 25 mEq/L (ref 19–32)
Chloride: 98 mEq/L (ref 96–112)
Creatinine, Ser: 0.63 mg/dL (ref 0.50–1.10)
GFR calc Af Amer: >90 mL/min (ref >90)
Potassium: 4.2 mEq/L (ref 3.5–5.1)

## 2011-11-04 MED ORDER — CARVEDILOL 12.5 MG PO TABS: 6.2500 mg | ORAL_TABLET | Freq: Two times a day (BID) | ORAL | Status: DC

## 2011-11-04 MED ORDER — RAMIPRIL 2.5 MG PO TABS: 2.5000 mg | ORAL_TABLET | Freq: Every evening | ORAL | Status: DC

## 2011-11-04 NOTE — Assessment & Plan Note (Addendum)
Volume status looks ok today but hypotensive.  NYHA III.  Will cut back on altace to 2.5 mg daily and carvedilol 6.25 mg BID.  Will continue diuretics.  Explained importance of daily weights, she has been given daily weight charts.  Will check BMET today.    Patient seen and examined with Ulyess Blossom PA-C. We discussed all aspects of the encounter. I agree with the assessment and plan as stated above.  Remains a bit tenuous. Volume status looks ok but she has symptomatic hypotension. Will cut back on HF meds a bit. Check labs. Reinforced need for daily weights and reviewed use of sliding scale diuretics.

## 2011-11-04 NOTE — Progress Notes (Signed)
HPI:  56 year old female with history of breast cancer, obesity, HTN, diabetes, IBS, and OSA. She had chemo induced cardiomyopathy. Cardiac MRI in July 2010 EF 45%. Myoview in 10/11 EF 59%. No ischemia.   Admitted 08/2011 for ADHF after she had run out medications for 2 weeks. She responded well to IV diuretics. Discharge weight 226 . Discharged on Lasix 40 mg bid.   Echo 08/12/11: Left ventricle: mildly dilated, EF 20% to 25%. Diffuse hypokinesis. Doppler parameters are consistent with restrictive physiology, indicative ofdecreased left ventricular diastolic compliance and/or increased left atrial pressure. Left atrium was moderately dilated. Right atrium mildly dilated.  She returns for follow up today, with her mother Reita Cliche.  She feels wiped out right now.  Sugar low this am, was 68.  SBP108 last night, here it 76.  When she sits up the "top of her head is going to come off".  Took extra 2 lasix 2 days ago due to increased dyspnea.  Chest pain on right side over the last 4 days, worse when she lays down.  Wears CPAP at night.  Can walk 50 feet prior to getting dyspniec.  Feels she has edema.  Compliant with medications.  No weighing.       ROS: All systems negative except as listed in HPI, PMH and Problem List.  Past Medical History  Diagnosis Date  . 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 DYSLIPIDEMIA 05/01/2009  . OBESITY 07/24/2007  . OBSTRUCTIVE SLEEP APNEA 07/24/2007    with CPAP compliance  . RESTLESS LEG SYNDROME 07/24/2007  . SYSTOLIC HEART FAILURE, CHRONIC 01/05/2009  . TACHYCARDIA 10/25/2008  . Palpitations 09/20/2010  . 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 Hypertension   . 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 Previous EF 25%. ECHO 6/08 EF 55% ECHO 5/10 EF 30-35% MRI 7/10 EF 45% Myoview 10/11 59% no ischemia  . History of breast cancer 2006    with return in 2008  . Osteoarthritis   . Anxiety   . Depression    followed by a psychiatrist  . Asthma   . GERD (gastroesophageal reflux disease)   . Gastroparesis   . IBS (irritable bowel syndrome)     Current Outpatient Prescriptions  Medication Sig Dispense Refill  . acetaminophen (TYLENOL ARTHRITIS PAIN) 650 MG CR tablet Take 1,300 mg by mouth every 8 (eight) hours as needed. pain      . albuterol (PROVENTIL HFA) 108 (90 BASE) MCG/ACT inhaler Inhale 2 puffs into the lungs 2 (two) times a week.       Marland Kitchen atorvastatin (LIPITOR) 40 MG tablet Take 40 mg by mouth daily.        . busPIRone (BUSPAR) 30 MG tablet Take 30 mg by mouth 2 (two) times daily.        . carvedilol (COREG) 12.5 MG tablet Take 1 tablet (12.5 mg total) by mouth 2 (two) times daily with a meal.  60 tablet  11  . citalopram (CELEXA) 20 MG tablet 2 tablets by mouth once daily       . cyclobenzaprine (FLEXERIL) 10 MG tablet Take 1 tablet (10 mg total) by mouth 2 (two) times daily as needed.  60 tablet  5  . digoxin (LANOXIN) 0.125 MG tablet Take 1 tablet (125 mcg total) by mouth daily.  30 tablet  1  . diphenhydramine-acetaminophen (TYLENOL PM) 25-500 MG TABS Take 1 tablet by mouth at bedtime as needed.        Marland Kitchen  furosemide (LASIX) 20 MG tablet Take 3 tabs in AM (60 mg) and 2 tabs in PM (40 mg)      . Glucosamine 500 MG CAPS Take 1 capsule by mouth daily.        . insulin lispro protamine-insulin lispro (HUMALOG 75/25) (75-25) 100 UNIT/ML SUSP 80 units with breakfast, and 80 units with the evening meal.      . Insulin Pen Needle (B-D ULTRAFINE III SHORT PEN) 31G X 8 MM MISC 18/day as directed dx 250.00  500 each  11  . levothyroxine (SYNTHROID, LEVOTHROID) 75 MCG tablet Take 75 mcg by mouth daily.        Marland Kitchen loperamide (IMODIUM A-D) 2 MG tablet Take 2 mg by mouth as needed. diarrhea      . mometasone (NASONEX) 50 MCG/ACT nasal spray Place 2 sprays into the nose daily as needed. allergies      . nitroGLYCERIN (NITROSTAT) 0.4 MG SL tablet Place 1 tablet (0.4 mg total) under the tongue as needed.   25 tablet  6  . omega-3 acid ethyl esters (LOVAZA) 1 G capsule Take 4 capsules by mouth daily.        . pantoprazole (PROTONIX) 40 MG tablet Take 40 mg by mouth 2 (two) times daily.        . potassium chloride SA (K-DUR,KLOR-CON) 20 MEQ tablet Take 60 mEq by mouth 2 (two) times daily.       . ramipril (ALTACE) 2.5 MG tablet Take 2.5 mg by mouth 2 (two) times daily.        Marland Kitchen rOPINIRole (REQUIP) 1 MG tablet Take 1 mg by mouth at bedtime.        Marland Kitchen spironolactone (ALDACTONE) 25 MG tablet Take 1 tablet (25 mg total) by mouth daily.  30 tablet  11  . SUMAtriptan (IMITREX) 25 MG tablet Take 25 mg by mouth every 2 (two) hours as needed.      . tiotropium (SPIRIVA) 18 MCG inhalation capsule Place 18 mcg into inhaler and inhale daily.        . traMADol (ULTRAM) 50 MG tablet Take 50 mg by mouth every 6 (six) hours as needed. pain      . traZODone (DESYREL) 50 MG tablet Take 100 mg by mouth 2 (two) times daily. Can take extra tablet if needed.      Marland Kitchen DISCONTD: insulin lispro protamine-insulin lispro (HUMALOG MIX 75/25 KWIKPEN) (75-25) 100 UNIT/ML SUSP Inject 250 Units into the skin 2 (two) times daily with a meal. (with first and last meals of the day).  180 mL  12     PHYSICAL EXAM: Filed Vitals:   11/04/11 0940  BP: 72/40  Pulse: 96  Weight: 234 lb (106.142 kg)  SpO2: 94%  Doppler: 88  General:  Obese, Well appearing. No resp difficulty HEENT: normal Neck: supple. JVP 7-8. Carotids 2+ bilaterally; no bruits. No lymphadenopathy or thryomegaly appreciated. Cor: PMI normal. Regular rate & rhythm. No rubs, soft  S3  or murmurs. Lungs: clear Abdomen: soft, obese, nontender,distended No hepatosplenomegaly. No bruits or masses. Good bowel sounds. Extremities: no cyanosis, clubbing, rash, tr edema Neuro: alert & orientedx3, cranial nerves grossly intact. Moves all 4 extremities w/o difficulty. Affect pleasant.    ASSESSMENT & PLAN:

## 2011-11-04 NOTE — Patient Instructions (Addendum)
Cut back on carvedilol and altace.    - take carvedilol 6.25 mg (0.5 tabs) twice daily    - take altace 2.5 mg at night only  Follow up 2 weeks.  Labs today.  Do the following things EVERYDAY: 1) Weigh yourself in the morning before breakfast. Write it down and keep it in a log. 2) Take your medicines as prescribed 3) Eat low salt foods--Limit salt (sodium) to 2000mg  per day.  4) Stay as active as you can everyday

## 2011-11-04 NOTE — Assessment & Plan Note (Signed)
Appears mildly symptomatic.  As above, will cut back on altace and carvedilol.  Follow up 2 weeks.

## 2011-11-14 ENCOUNTER — Encounter (HOSPITAL_COMMUNITY): Payer: Self-pay

## 2011-11-18 ENCOUNTER — Ambulatory Visit (HOSPITAL_COMMUNITY)
Admission: RE | Admit: 2011-11-18 | Discharge: 2011-11-18 | Disposition: A | Discharge status: Home / Self Care | Payer: Medicare Other | Source: Ambulatory Visit | Attending: Internal Medicine | Admitting: Internal Medicine

## 2011-11-18 VITALS — BP 104/64 | HR 115 | Wt 223.5 lb

## 2011-11-18 DIAGNOSIS — R002 Palpitations: Secondary | ICD-10-CM | POA: Insufficient documentation

## 2011-11-18 DIAGNOSIS — I5022 Chronic systolic (congestive) heart failure: Secondary | ICD-10-CM | POA: Insufficient documentation

## 2011-11-18 LAB — BASIC METABOLIC PANEL
CO2: 28 mEq/L (ref 19–32)
Chloride: 98 mEq/L (ref 96–112)
Creatinine, Ser: 0.71 mg/dL (ref 0.50–1.10)
Glucose, Bld: 93 mg/dL (ref 70–99)

## 2011-11-18 NOTE — Progress Notes (Signed)
HPI:  56 year old female with history of breast cancer, obesity, HTN, diabetes, IBS, and OSA. She had chemo induced cardiomyopathy. Cardiac MRI in July 2010 EF 45%. Myoview in 10/11 EF 59%. No ischemia.   Admitted 08/2011 for ADHF after she had run out medications for 2 weeks. She responded well to IV diuretics. Discharge weight 226 . Discharged on Lasix 40 mg bid.   Echo 08/12/11: Left ventricle: mildly dilated, EF 20% to 25%. Diffuse hypokinesis. Doppler parameters are consistent with restrictive physiology, indicative ofdecreased left ventricular diastolic compliance and/or increased left atrial pressure. Left atrium was moderately dilated. Right atrium mildly dilated.  She returns for follow up today, with her mother Reita Cliche.  HF meds decreased at last visit due to symptomatic hypotension. She feels very well and reports having increased energy. Sugars stable now and reports no low sugars. BP at home 120s/60s. No extra doses of lasix needed and reports taking medications as prescribed along with weighing daily.  Denies any SOB, CP, or edema.    ROS: All systems negative except as listed in HPI, PMH and Problem List.  Past Medical History  Diagnosis Date  . 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 DYSLIPIDEMIA 05/01/2009  . OBESITY 07/24/2007  . OBSTRUCTIVE SLEEP APNEA 07/24/2007    with CPAP compliance  . RESTLESS LEG SYNDROME 07/24/2007  . SYSTOLIC HEART FAILURE, CHRONIC 01/05/2009  . TACHYCARDIA 10/25/2008  . Palpitations 09/20/2010  . 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 Hypertension   . 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 Previous EF 25%. ECHO 6/08 EF 55% ECHO 5/10 EF 30-35% MRI 7/10 EF 45% Myoview 10/11 59% no ischemia  . History of breast cancer 2006    with return in 2008  . Osteoarthritis   . Anxiety   . Depression     followed by a psychiatrist  . Asthma   . GERD (gastroesophageal reflux disease)   . Gastroparesis     . IBS (irritable bowel syndrome)     Current Outpatient Prescriptions  Medication Sig Dispense Refill  . acetaminophen (TYLENOL ARTHRITIS PAIN) 650 MG CR tablet Take 1,300 mg by mouth every 8 (eight) hours as needed. pain      . albuterol (PROVENTIL HFA) 108 (90 BASE) MCG/ACT inhaler Inhale 2 puffs into the lungs 2 (two) times a week.       Marland Kitchen atorvastatin (LIPITOR) 40 MG tablet Take 40 mg by mouth daily.        . busPIRone (BUSPAR) 30 MG tablet Take 30 mg by mouth 2 (two) times daily.       . carvedilol (COREG) 12.5 MG tablet Take 0.5 tablets (6.25 mg total) by mouth 2 (two) times daily with a meal.  60 tablet  11  . citalopram (CELEXA) 20 MG tablet 2 tablets by mouth once daily       . cyclobenzaprine (FLEXERIL) 10 MG tablet Take 1 tablet (10 mg total) by mouth 2 (two) times daily as needed.  60 tablet  5  . digoxin (LANOXIN) 0.125 MG tablet Take 1 tablet (125 mcg total) by mouth daily.  30 tablet  1  . diphenhydramine-acetaminophen (TYLENOL PM) 25-500 MG TABS Take 1 tablet by mouth at bedtime as needed.        . furosemide (LASIX) 20 MG tablet Take 3 tabs in AM (60 mg) and 2 tabs in PM (40 mg)      . Glucosamine 500 MG CAPS  Take 1 capsule by mouth daily.        . insulin lispro protamine-insulin lispro (HUMALOG 75/25) (75-25) 100 UNIT/ML SUSP 95 units with breakfast, and 95 units with the evening meal.      . Insulin Pen Needle (B-D ULTRAFINE III SHORT PEN) 31G X 8 MM MISC 18/day as directed dx 250.00  500 each  11  . levothyroxine (SYNTHROID, LEVOTHROID) 75 MCG tablet Take 75 mcg by mouth daily.        Marland Kitchen loperamide (IMODIUM A-D) 2 MG tablet Take 2 mg by mouth as needed. diarrhea      . mometasone (NASONEX) 50 MCG/ACT nasal spray Place 2 sprays into the nose daily as needed. allergies      . nitroGLYCERIN (NITROSTAT) 0.4 MG SL tablet Place 1 tablet (0.4 mg total) under the tongue as needed.  25 tablet  6  . omega-3 acid ethyl esters (LOVAZA) 1 G capsule Take 4 capsules by mouth daily.         . pantoprazole (PROTONIX) 40 MG tablet Take 40 mg by mouth 2 (two) times daily.        . potassium chloride SA (K-DUR,KLOR-CON) 20 MEQ tablet Take 60 mEq by mouth 2 (two) times daily.       . ramipril (ALTACE) 2.5 MG tablet Take 1 tablet (2.5 mg total) by mouth Nightly.      Marland Kitchen rOPINIRole (REQUIP) 1 MG tablet Take 1 mg by mouth at bedtime.        Marland Kitchen spironolactone (ALDACTONE) 25 MG tablet Take 1 tablet (25 mg total) by mouth daily.  30 tablet  11  . SUMAtriptan (IMITREX) 25 MG tablet Take 25 mg by mouth every 2 (two) hours as needed.      . tiotropium (SPIRIVA) 18 MCG inhalation capsule Place 18 mcg into inhaler and inhale daily.        . traMADol (ULTRAM) 50 MG tablet Take 50 mg by mouth every 6 (six) hours as needed. pain      . traZODone (DESYREL) 50 MG tablet Take 100 mg by mouth 2 (two) times daily. Can take extra tablet if needed.      Marland Kitchen DISCONTD: insulin lispro protamine-insulin lispro (HUMALOG MIX 75/25 KWIKPEN) (75-25) 100 UNIT/ML SUSP Inject 250 Units into the skin 2 (two) times daily with a meal. (with first and last meals of the day).  180 mL  12     PHYSICAL EXAM: Filed Vitals:   11/18/11 1218  BP: 104/64  Pulse: 115  Weight: 223 lb 8 oz (101.379 kg)  SpO2: 96%  Weight down 11 lbs from 11/04/11  General:  Obese, Well appearing. No resp difficulty HEENT: normal Neck: supple. no JVP. Carotids 2+ bilaterally; no bruits. No lymphadenopathy or thryomegaly appreciated. Cor: PMI normal. Regular rate & rhythm. No rubs, soft  S3  or murmurs. Lungs: clear Abdomen: soft, obese, nontender,distended No hepatosplenomegaly. No bruits or masses. Good bowel sounds. Extremities: no cyanosis, clubbing, rash, tr edema Neuro: alert & orientedx3, cranial nerves grossly intact. Moves all 4 extremities w/o difficulty. Affect pleasant.  EKG: Sinus tachycardia     ASSESSMENT & PLAN:

## 2011-11-18 NOTE — Assessment & Plan Note (Addendum)
NYHA II and volume status good. Taking medications as prescribed and weighing daily. BP stable, will not make any changes today. Will try to increase pm coreg next time. HR increased so will obtain EKG and labs today.  Patient seen and examined with Ulla Potash, NP. We discussed all aspects of the encounter. I agree with the assessment and plan as stated above. She is feeling much better after cutting back her HF meds. BP improved. No volume overload on exam. Remains tachycardic however. Will continue current regimen for now. Reinforced need for daily weights and reviewed use of sliding scale diuretics. ECG shows sinus tach. Follow closely.

## 2011-11-18 NOTE — Patient Instructions (Signed)
No changes to medications. Will follow up in 3-4 weeks.   Do the following things EVERYDAY: 1) Weigh yourself in the morning before breakfast. Write it down and keep it in a log. 2) Take your medicines as prescribed 3) Eat low salt foods--Limit salt (sodium) to 2000mg  per day.  4) Stay as active as you can everyday

## 2011-11-26 ENCOUNTER — Telehealth (HOSPITAL_COMMUNITY): Payer: Self-pay | Admitting: *Deleted

## 2011-11-26 MED ORDER — FUROSEMIDE 20 MG PO TABS: ORAL_TABLET | ORAL | Status: DC

## 2011-11-26 NOTE — Telephone Encounter (Signed)
[name] needs lasix to be called into her pharmacy.  She is taking 3 in the am and 2 in the afternoon.  Please call her back.

## 2011-11-26 NOTE — Telephone Encounter (Signed)
Pt aware rx sent in. 

## 2011-12-03 ENCOUNTER — Other Ambulatory Visit: Payer: Self-pay | Admitting: Internal Medicine

## 2011-12-03 MED ORDER — SPIRONOLACTONE 25 MG PO TABS: 25.0000 mg | ORAL_TABLET | Freq: Every day | ORAL | Status: DC

## 2011-12-09 ENCOUNTER — Encounter (HOSPITAL_COMMUNITY): Payer: Self-pay

## 2011-12-09 ENCOUNTER — Ambulatory Visit (HOSPITAL_COMMUNITY)
Admission: RE | Admit: 2011-12-09 | Discharge: 2011-12-09 | Disposition: A | Discharge status: Home / Self Care | Payer: Medicare Other | Source: Ambulatory Visit | Attending: Internal Medicine | Admitting: Internal Medicine

## 2011-12-09 VITALS — BP 110/60 | HR 109 | Ht 65.0 in | Wt 232.0 lb

## 2011-12-09 DIAGNOSIS — F329 Major depressive disorder, single episode, unspecified: Secondary | ICD-10-CM | POA: Insufficient documentation

## 2011-12-09 DIAGNOSIS — M1990 Unspecified osteoarthritis, unspecified site: Secondary | ICD-10-CM | POA: Insufficient documentation

## 2011-12-09 DIAGNOSIS — Z794 Long term (current) use of insulin: Secondary | ICD-10-CM | POA: Insufficient documentation

## 2011-12-09 DIAGNOSIS — E119 Type 2 diabetes mellitus without complications: Secondary | ICD-10-CM | POA: Insufficient documentation

## 2011-12-09 DIAGNOSIS — F3289 Other specified depressive episodes: Secondary | ICD-10-CM | POA: Insufficient documentation

## 2011-12-09 DIAGNOSIS — G4733 Obstructive sleep apnea (adult) (pediatric): Secondary | ICD-10-CM | POA: Insufficient documentation

## 2011-12-09 DIAGNOSIS — F411 Generalized anxiety disorder: Secondary | ICD-10-CM | POA: Insufficient documentation

## 2011-12-09 DIAGNOSIS — Z888 Allergy status to other drugs, medicaments and biological substances status: Secondary | ICD-10-CM | POA: Insufficient documentation

## 2011-12-09 DIAGNOSIS — E669 Obesity, unspecified: Secondary | ICD-10-CM | POA: Insufficient documentation

## 2011-12-09 DIAGNOSIS — K589 Irritable bowel syndrome without diarrhea: Secondary | ICD-10-CM | POA: Insufficient documentation

## 2011-12-09 DIAGNOSIS — Z853 Personal history of malignant neoplasm of breast: Secondary | ICD-10-CM | POA: Insufficient documentation

## 2011-12-09 DIAGNOSIS — I5022 Chronic systolic (congestive) heart failure: Secondary | ICD-10-CM | POA: Insufficient documentation

## 2011-12-09 DIAGNOSIS — K219 Gastro-esophageal reflux disease without esophagitis: Secondary | ICD-10-CM | POA: Insufficient documentation

## 2011-12-09 DIAGNOSIS — I509 Heart failure, unspecified: Secondary | ICD-10-CM | POA: Insufficient documentation

## 2011-12-09 DIAGNOSIS — I10 Essential (primary) hypertension: Secondary | ICD-10-CM | POA: Insufficient documentation

## 2011-12-09 NOTE — Patient Instructions (Signed)
Take extra lasix 40 mg (2 tabs) today.  If weight hits 226 pounds or greater then take extra lasix 20 mg.  CUT BACK on fluids, try hard candies for thirst  Follow up 1 month.  Do the following things EVERYDAY: 1) Weigh yourself in the morning before breakfast. Write it down and keep it in a log. 2) Take your medicines as prescribed 3) Eat low salt foods--Limit salt (sodium) to 2000mg  per day.  4) Stay as active as you can everyday 5) Keep fluids less than 2 liters per day

## 2011-12-09 NOTE — Progress Notes (Signed)
HPI:  56 year old female with history of breast cancer, obesity, HTN, diabetes, IBS, and OSA. She had chemo induced cardiomyopathy. Cardiac MRI in July 2010 EF 45%. Myoview in 10/11 EF 59%. No ischemia.   Admitted 08/2011 for ADHF after she had run out medications for 2 weeks. She responded well to IV diuretics. Discharge weight 226 . Discharged on Lasix 40 mg bid.   Echo 08/12/11: Left ventricle: mildly dilated, EF 20% to 25%. Diffuse hypokinesis. Doppler parameters are consistent with restrictive physiology, indicative ofdecreased left ventricular diastolic compliance and/or increased left atrial pressure. Left atrium was moderately dilated. Right atrium mildly dilated.  She returns for 2 week follow up today.  Her altace and coreg were cut back in April due to symptomatic hypotension.  She feels ok today.  She is a little more short of breath today.  Mild abdominal distention.  Weight has gone up 7 pounds.  Weight ranging from 223-231 pounds.  Trying to follow low sodium diet but did have chili beans for dinner last night.  She is also drinking a lot of water throughout the day.  SBPs improving at home.  Has not taken extra lasix because she had just enough pills to last her until her refills last week.     ROS: All systems negative except as listed in HPI, PMH and Problem List.  Past Medical History  Diagnosis Date  . 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 DYSLIPIDEMIA 05/01/2009  . OBESITY 07/24/2007  . OBSTRUCTIVE SLEEP APNEA 07/24/2007    with CPAP compliance  . RESTLESS LEG SYNDROME 07/24/2007  . SYSTOLIC HEART FAILURE, CHRONIC 01/05/2009  . TACHYCARDIA 10/25/2008  . Palpitations 09/20/2010  . 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 Hypertension   . 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 Previous EF 25%. ECHO 6/08 EF 55% ECHO 5/10 EF 30-35% MRI 7/10 EF 45% Myoview 10/11 59% no ischemia  . History of breast cancer 2006    with  return in 2008  . Osteoarthritis   . Anxiety   . Depression     followed by a psychiatrist  . Asthma   . GERD (gastroesophageal reflux disease)   . Gastroparesis   . IBS (irritable bowel syndrome)     Current Outpatient Prescriptions  Medication Sig Dispense Refill  . acetaminophen (TYLENOL ARTHRITIS PAIN) 650 MG CR tablet Take 1,300 mg by mouth every 8 (eight) hours as needed. pain      . albuterol (PROVENTIL HFA) 108 (90 BASE) MCG/ACT inhaler Inhale 2 puffs into the lungs 2 (two) times a week.       Marland Kitchen atorvastatin (LIPITOR) 40 MG tablet Take 40 mg by mouth daily.        . busPIRone (BUSPAR) 30 MG tablet Take 30 mg by mouth 2 (two) times daily.       . carvedilol (COREG) 12.5 MG tablet Take 0.5 tablets (6.25 mg total) by mouth 2 (two) times daily with a meal.  60 tablet  11  . citalopram (CELEXA) 20 MG tablet 2 tablets by mouth once daily       . cyclobenzaprine (FLEXERIL) 10 MG tablet Take 1 tablet (10 mg total) by mouth 2 (two) times daily as needed.  60 tablet  5  . digoxin (LANOXIN) 0.125 MG tablet Take 1 tablet (125 mcg total) by mouth daily.  30 tablet  1  . diphenhydramine-acetaminophen (TYLENOL PM) 25-500 MG TABS Take 1 tablet by  mouth at bedtime as needed.        . furosemide (LASIX) 20 MG tablet Take 3 tabs in AM (60 mg) and 2 tabs in PM (40 mg)  150 tablet  6  . Glucosamine 500 MG CAPS Take 1 capsule by mouth daily.        . insulin lispro protamine-insulin lispro (HUMALOG 75/25) (75-25) 100 UNIT/ML SUSP 95 units with breakfast, and 95 units with the evening meal.      . Insulin Pen Needle (B-D ULTRAFINE III SHORT PEN) 31G X 8 MM MISC 18/day as directed dx 250.00  500 each  11  . levothyroxine (SYNTHROID, LEVOTHROID) 75 MCG tablet Take 75 mcg by mouth daily.        Marland Kitchen loperamide (IMODIUM A-D) 2 MG tablet Take 2 mg by mouth as needed. diarrhea      . mometasone (NASONEX) 50 MCG/ACT nasal spray Place 2 sprays into the nose daily as needed. allergies      . nitroGLYCERIN  (NITROSTAT) 0.4 MG SL tablet Place 1 tablet (0.4 mg total) under the tongue as needed.  25 tablet  6  . omega-3 acid ethyl esters (LOVAZA) 1 G capsule Take 4 capsules by mouth daily.        . pantoprazole (PROTONIX) 40 MG tablet Take 40 mg by mouth 2 (two) times daily.        . potassium chloride SA (K-DUR,KLOR-CON) 20 MEQ tablet Take 60 mEq by mouth 2 (two) times daily.       . ramipril (ALTACE) 2.5 MG tablet Take 1 tablet (2.5 mg total) by mouth Nightly.      Marland Kitchen rOPINIRole (REQUIP) 1 MG tablet Take 1 mg by mouth at bedtime.        . SUMAtriptan (IMITREX) 25 MG tablet Take 25 mg by mouth every 2 (two) hours as needed.      . tiotropium (SPIRIVA) 18 MCG inhalation capsule Place 18 mcg into inhaler and inhale daily.        . traMADol (ULTRAM) 50 MG tablet Take 50 mg by mouth every 6 (six) hours as needed. pain      . traZODone (DESYREL) 50 MG tablet Take 100 mg by mouth 2 (two) times daily. Can take extra tablet if needed.      Marland Kitchen spironolactone (ALDACTONE) 25 MG tablet Take 1 tablet (25 mg total) by mouth daily.  30 tablet  3  . DISCONTD: insulin lispro protamine-insulin lispro (HUMALOG MIX 75/25 KWIKPEN) (75-25) 100 UNIT/ML SUSP Inject 250 Units into the skin 2 (two) times daily with a meal. (with first and last meals of the day).  180 mL  12     PHYSICAL EXAM: Filed Vitals:   12/09/11 1341  BP: 110/60  Pulse: 109  Height: 5\' 5"  (1.651 m)  Weight: 232 lb (105.235 kg)  SpO2: 96%   General:  Obese, Well appearing. No resp difficulty HEENT: normal Neck: supple. JVP 8-9. Carotids 2+ bilaterally; no bruits. No lymphadenopathy or thryomegaly appreciated. Cor: PMI normal. Regular rate & rhythm. soft S3. Lungs: clear Abdomen: soft, obese, nontender,mildly distended. No hepatosplenomegaly. No bruits or masses. Good bowel sounds. Extremities: no cyanosis, clubbing, rash, tr edema Neuro: alert & orientedx3, cranial nerves grossly intact. Moves all 4 extremities w/o difficulty. Affect  pleasant.    ASSESSMENT & PLAN:

## 2011-12-09 NOTE — Assessment & Plan Note (Addendum)
Volume status elevated today due to increased fluid intake and dietary indiscetions.  Will have her take an extra lasix 40 mg today to keep weight 222-225 pounds.  If her weight is 226 or above she is to take an extra lasix 20 mg that day.  Have instructed her to cut back on fluid intake.  Again reinforced low sodium diet.  She had labs drawn at her [facility] today, will have them fax over.  Will have her follow up in 1 month and hopefully begin to titrate coreg at that time.     Patient seen and examined with Ulyess Blossom, PA-C. We discussed all aspects of the encounter. I agree with the assessment and plan as stated above. She is non-compliant with fluid restriction. Long talk about need to be more compliant. Reinforced need for daily weights and reviewed use of sliding scale diuretics. Unable to titrate meds further at this time due to severe fatigue at higher doses recently.

## 2011-12-11 ENCOUNTER — Other Ambulatory Visit: Payer: Self-pay | Admitting: Internal Medicine

## 2011-12-11 MED ORDER — DIGOXIN 125 MCG PO TABS: 125.0000 ug | ORAL_TABLET | Freq: Every day | ORAL | Status: DC

## 2011-12-24 ENCOUNTER — Encounter (HOSPITAL_COMMUNITY): Payer: Self-pay

## 2012-01-02 ENCOUNTER — Telehealth: Payer: Self-pay | Admitting: Oncology

## 2012-01-02 NOTE — Telephone Encounter (Signed)
S/w pt re appts for July. Pt happened to be @ [facility] today w/her mom when I called and was given schedule for July and appt for 6/14 mammo @ 3 pm @ [facility]. S/w delores @ [facility] to schedule annual mammo.

## 2012-01-13 ENCOUNTER — Encounter (HOSPITAL_COMMUNITY): Payer: Medicare Other

## 2012-01-16 ENCOUNTER — Ambulatory Visit: Payer: Medicare Other | Admitting: Endocrinology

## 2012-01-17 ENCOUNTER — Ambulatory Visit (HOSPITAL_COMMUNITY)
Admission: RE | Admit: 2012-01-17 | Discharge: 2012-01-17 | Disposition: A | Discharge status: Home / Self Care | Payer: Medicare Other | Source: Ambulatory Visit | Attending: Internal Medicine | Admitting: Internal Medicine

## 2012-01-17 ENCOUNTER — Encounter (HOSPITAL_COMMUNITY): Payer: Self-pay

## 2012-01-17 VITALS — BP 100/69 | HR 116 | Wt 228.1 lb

## 2012-01-17 DIAGNOSIS — I5022 Chronic systolic (congestive) heart failure: Secondary | ICD-10-CM | POA: Insufficient documentation

## 2012-01-17 DIAGNOSIS — R000 Tachycardia, unspecified: Secondary | ICD-10-CM

## 2012-01-17 MED ORDER — CARVEDILOL 12.5 MG PO TABS: ORAL_TABLET | ORAL | Status: DC

## 2012-01-17 NOTE — Assessment & Plan Note (Signed)
Sinus tachycardia on EKG 114 BPM. Increase carvedilol 12.5 mg at night.

## 2012-01-17 NOTE — Assessment & Plan Note (Addendum)
Volume status stable. Weight down 3 pounds. Increase night time Carvedilol to 12.5 mg. Reinforced daily weights and limiting fluid intake to less than 2 liters per day. Will need repeat ECHO if EF remains less than 35% will need defibrillator. Follow in 2 weeks for medication titration.

## 2012-01-17 NOTE — Patient Instructions (Addendum)
Take Carvedilol 6.25 mg in am 1/2 tab and 12.5 mg in pm 1 tab  Follow up in 2 weeks  Do the following things EVERYDAY: 1) Weigh yourself in the morning before breakfast. Write it down and keep it in a log. 2) Take your medicines as prescribed 3) Eat low salt foods--Limit salt (sodium) to 2000 mg per day.  4) Stay as active as you can everyday 5) Limit all fluids for the day to less than 2 liters

## 2012-01-17 NOTE — Progress Notes (Signed)
Patient ID: [name], female   DOB: [birth year], 56 y.o.   MRN: [id number] HPI:  56 year old female with history of breast cancer, obesity, HTN, diabetes, IBS, and OSA. She had chemo induced cardiomyopathy. Cardiac MRI in July 2010 EF 45%. Myoview in 10/11 EF 59%. No ischemia.   Admitted 08/2011 for ADHF after she had run out medications for 2 weeks. She responded well to IV diuretics. Discharge weight 226 . Discharged on Lasix 40 mg bid.   Echo 08/12/11: Left ventricle: mildly dilated, EF 20% to 25%. Diffuse hypokinesis. Doppler parameters are consistent with restrictive physiology, indicative ofdecreased left ventricular diastolic compliance and/or increased left atrial pressure. Left atrium was moderately dilated. Right atrium mildly dilated.  She returns for follow up. Complains of dyspnea with exertion and heart rate racing. Denies PND/Orthopnea/CP. Weight at home 223-229 pounds. She has not required extra lasix. No lower extremity edema. Uses CPAP.   ROS: All systems negative except as listed in HPI, PMH and Problem List.  Past Medical History  Diagnosis Date  . 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 DYSLIPIDEMIA 05/01/2009  . OBESITY 07/24/2007  . OBSTRUCTIVE SLEEP APNEA 07/24/2007    with CPAP compliance  . RESTLESS LEG SYNDROME 07/24/2007  . SYSTOLIC HEART FAILURE, CHRONIC 01/05/2009  . TACHYCARDIA 10/25/2008  . Palpitations 09/20/2010  . 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 Hypertension   . 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 Previous EF 25%. ECHO 6/08 EF 55% ECHO 5/10 EF 30-35% MRI 7/10 EF 45% Myoview 10/11 59% no ischemia  . History of breast cancer 2006    with return in 2008  . Osteoarthritis   . Anxiety   . Depression     followed by a psychiatrist  . Asthma   . GERD (gastroesophageal reflux disease)   . Gastroparesis   . IBS (irritable bowel syndrome)     Current Outpatient Prescriptions  Medication  Sig Dispense Refill  . acetaminophen (TYLENOL ARTHRITIS PAIN) 650 MG CR tablet Take 1,300 mg by mouth every 8 (eight) hours as needed. pain      . albuterol (PROVENTIL HFA) 108 (90 BASE) MCG/ACT inhaler Inhale 2 puffs into the lungs 2 (two) times a week.       Marland Kitchen atorvastatin (LIPITOR) 40 MG tablet Take 40 mg by mouth daily.        . busPIRone (BUSPAR) 30 MG tablet Take 30 mg by mouth 2 (two) times daily.       . carvedilol (COREG) 12.5 MG tablet Take 0.5 tablets (6.25 mg total) by mouth 2 (two) times daily with a meal.  60 tablet  11  . citalopram (CELEXA) 20 MG tablet 2 tablets by mouth once daily       . cyclobenzaprine (FLEXERIL) 10 MG tablet Take 1 tablet (10 mg total) by mouth 2 (two) times daily as needed.  60 tablet  5  . digoxin (LANOXIN) 0.125 MG tablet Take 1 tablet (125 mcg total) by mouth daily.  30 tablet  5  . diphenhydramine-acetaminophen (TYLENOL PM) 25-500 MG TABS Take 1 tablet by mouth at bedtime as needed.        . furosemide (LASIX) 20 MG tablet Take 3 tabs in AM (60 mg) and 2 tabs in PM (40 mg)  150 tablet  6  . Glucosamine 500 MG CAPS Take 1 capsule by mouth daily.        . insulin lispro protamine-insulin  lispro (HUMALOG 75/25) (75-25) 100 UNIT/ML SUSP 95 units with breakfast, and 95 units with the evening meal.      . Insulin Pen Needle (B-D ULTRAFINE III SHORT PEN) 31G X 8 MM MISC 18/day as directed dx 250.00  500 each  11  . levothyroxine (SYNTHROID, LEVOTHROID) 75 MCG tablet Take 75 mcg by mouth daily.        Marland Kitchen loperamide (IMODIUM A-D) 2 MG tablet Take 2 mg by mouth as needed. diarrhea      . mometasone (NASONEX) 50 MCG/ACT nasal spray Place 2 sprays into the nose daily as needed. allergies      . nitroGLYCERIN (NITROSTAT) 0.4 MG SL tablet Place 1 tablet (0.4 mg total) under the tongue as needed.  25 tablet  6  . omega-3 acid ethyl esters (LOVAZA) 1 G capsule Take 4 capsules by mouth daily.        . pantoprazole (PROTONIX) 40 MG tablet Take 40 mg by mouth 2 (two) times  daily.        . potassium chloride SA (K-DUR,KLOR-CON) 20 MEQ tablet Take 60 mEq by mouth 2 (two) times daily.       . ramipril (ALTACE) 2.5 MG tablet Take 1 tablet (2.5 mg total) by mouth Nightly.      Marland Kitchen rOPINIRole (REQUIP) 1 MG tablet Take 1 mg by mouth at bedtime.        Marland Kitchen spironolactone (ALDACTONE) 25 MG tablet Take 1 tablet (25 mg total) by mouth daily.  30 tablet  3  . SUMAtriptan (IMITREX) 25 MG tablet Take 25 mg by mouth every 2 (two) hours as needed.      . tiotropium (SPIRIVA) 18 MCG inhalation capsule Place 18 mcg into inhaler and inhale daily.        . traMADol (ULTRAM) 50 MG tablet Take 50 mg by mouth every 6 (six) hours as needed. pain      . traZODone (DESYREL) 50 MG tablet Take 100 mg by mouth 2 (two) times daily. Can take extra tablet if needed.      Marland Kitchen DISCONTD: insulin lispro protamine-insulin lispro (HUMALOG MIX 75/25 KWIKPEN) (75-25) 100 UNIT/ML SUSP Inject 250 Units into the skin 2 (two) times daily with a meal. (with first and last meals of the day).  180 mL  12     PHYSICAL EXAM: Filed Vitals:   01/17/12 1014  BP: 100/69  Pulse: 116  Weight: 228 lb 1.6 oz (103.465 kg)  SpO2: 96%   General:  Obese, Well appearing. No resp difficulty HEENT: normal Neck: supple. JVP 5-6. Carotids 2+ bilaterally; no bruits. No lymphadenopathy or thryomegaly appreciated. Cor: PMI normal. Regular rate & rhythm. soft S3. Lungs: clear Abdomen: soft, obese, nontender,mildly distended. No hepatosplenomegaly. No bruits or masses. Good bowel sounds. Extremities: no cyanosis, clubbing, rash, tr edema Neuro: alert & orientedx3, cranial nerves grossly intact. Moves all 4 extremities w/o difficulty. Affect pleasant.  EKG : Sinus Tachycardia  ASSESSMENT & PLAN:

## 2012-01-21 ENCOUNTER — Telehealth (INDEPENDENT_AMBULATORY_CARE_PROVIDER_SITE_OTHER): Payer: Self-pay | Admitting: General Surgery

## 2012-01-21 NOTE — Telephone Encounter (Signed)
Called patient to let her know that her mammogram has not changed at all since the last one so it looks good.  She was pleased with this and I told her if she has any questions than she should free to call.

## 2012-01-23 ENCOUNTER — Telehealth (HOSPITAL_COMMUNITY): Payer: Self-pay | Admitting: Internal Medicine

## 2012-01-23 MED ORDER — METOLAZONE 2.5 MG PO TABS: 2.5000 mg | ORAL_TABLET | ORAL | Status: DC

## 2012-01-23 NOTE — Telephone Encounter (Signed)
Please call [name], she is having SOB/Chest Heaviness and also left arm pain//pt stated she took Nitro glycerin and blood pressure was 112/64.  Pt wants call ASAP. Thanks.

## 2012-01-23 NOTE — Addendum Note (Signed)
Addended by: Noralee Space on: 01/23/2012 01:47 PM   Modules accepted: Orders

## 2012-01-23 NOTE — Telephone Encounter (Signed)
Discussed w/Dr Bensimhon will give pt metolazone 2.5 mg for 2 days, pt is aware and rx sent to pharmacy

## 2012-01-23 NOTE — Telephone Encounter (Signed)
Spoke w/pt she states she was at the grocery store earlier and started having "chest pressure" her wt was up 3 lbs today from yesterday, more edema than usual, can't lay flat due to breathing, increased SOB

## 2012-01-29 ENCOUNTER — Encounter (HOSPITAL_COMMUNITY): Payer: Self-pay | Admitting: *Deleted

## 2012-01-29 ENCOUNTER — Other Ambulatory Visit (HOSPITAL_COMMUNITY): Payer: Self-pay | Admitting: Adult Health

## 2012-01-29 ENCOUNTER — Ambulatory Visit (HOSPITAL_BASED_OUTPATIENT_CLINIC_OR_DEPARTMENT_OTHER)
Admission: RE | Admit: 2012-01-29 | Discharge: 2012-01-29 | Disposition: A | Discharge status: Home / Self Care | Payer: Medicare Other | Source: Ambulatory Visit | Attending: Internal Medicine | Admitting: Internal Medicine

## 2012-01-29 VITALS — BP 94/58 | HR 110 | Wt 229.5 lb

## 2012-01-29 DIAGNOSIS — I10 Essential (primary) hypertension: Secondary | ICD-10-CM | POA: Insufficient documentation

## 2012-01-29 DIAGNOSIS — I5022 Chronic systolic (congestive) heart failure: Secondary | ICD-10-CM

## 2012-01-29 DIAGNOSIS — E119 Type 2 diabetes mellitus without complications: Secondary | ICD-10-CM | POA: Insufficient documentation

## 2012-01-29 DIAGNOSIS — J45909 Unspecified asthma, uncomplicated: Secondary | ICD-10-CM | POA: Insufficient documentation

## 2012-01-29 DIAGNOSIS — F3289 Other specified depressive episodes: Secondary | ICD-10-CM | POA: Insufficient documentation

## 2012-01-29 DIAGNOSIS — G4733 Obstructive sleep apnea (adult) (pediatric): Secondary | ICD-10-CM | POA: Insufficient documentation

## 2012-01-29 DIAGNOSIS — G2581 Restless legs syndrome: Secondary | ICD-10-CM | POA: Insufficient documentation

## 2012-01-29 DIAGNOSIS — F329 Major depressive disorder, single episode, unspecified: Secondary | ICD-10-CM | POA: Insufficient documentation

## 2012-01-29 DIAGNOSIS — I5023 Acute on chronic systolic (congestive) heart failure: Secondary | ICD-10-CM

## 2012-01-29 DIAGNOSIS — K589 Irritable bowel syndrome without diarrhea: Secondary | ICD-10-CM | POA: Insufficient documentation

## 2012-01-29 DIAGNOSIS — Z794 Long term (current) use of insulin: Secondary | ICD-10-CM | POA: Insufficient documentation

## 2012-01-29 DIAGNOSIS — Z853 Personal history of malignant neoplasm of breast: Secondary | ICD-10-CM | POA: Insufficient documentation

## 2012-01-29 DIAGNOSIS — R000 Tachycardia, unspecified: Secondary | ICD-10-CM | POA: Insufficient documentation

## 2012-01-29 DIAGNOSIS — F411 Generalized anxiety disorder: Secondary | ICD-10-CM | POA: Insufficient documentation

## 2012-01-29 DIAGNOSIS — R002 Palpitations: Secondary | ICD-10-CM | POA: Insufficient documentation

## 2012-01-29 DIAGNOSIS — I509 Heart failure, unspecified: Secondary | ICD-10-CM | POA: Insufficient documentation

## 2012-01-29 DIAGNOSIS — Z888 Allergy status to other drugs, medicaments and biological substances status: Secondary | ICD-10-CM | POA: Insufficient documentation

## 2012-01-29 DIAGNOSIS — E669 Obesity, unspecified: Secondary | ICD-10-CM | POA: Insufficient documentation

## 2012-01-29 DIAGNOSIS — K219 Gastro-esophageal reflux disease without esophagitis: Secondary | ICD-10-CM | POA: Insufficient documentation

## 2012-01-29 LAB — BASIC METABOLIC PANEL
BUN: 19 mg/dL (ref 6–23)
CO2: 31 mEq/L (ref 19–32)
Calcium: 9.9 mg/dL (ref 8.4–10.5)
Chloride: 93 mEq/L — ABNORMAL LOW (ref 96–112)
Creatinine, Ser: 0.83 mg/dL (ref 0.50–1.10)

## 2012-01-29 LAB — CBC
HCT: 40.5 % (ref 36.0–46.0)
MCH: 28.4 pg (ref 26.0–34.0)
MCV: 87.1 fL (ref 78.0–100.0)
RBC: 4.65 MIL/uL (ref 3.87–5.11)
WBC: 9.9 10*3/uL (ref 4.0–10.5)

## 2012-01-29 NOTE — Patient Instructions (Addendum)
Labs today  Cath tomorrow

## 2012-01-29 NOTE — Progress Notes (Signed)
Patient ID: [name], female   DOB: 08/14/1955, 55 y.o.   MRN: [id number] HPI: 55 year old female with history of breast cancer, obesity, HTN, diabetes, IBS, and OSA. She had chemo induced cardiomyopathy. Cardiac MRI in July 2010 EF 45%. Myoview in 10/11 EF 59%. No ischemia.   Admitted 08/2011 for ADHF after she had run out medications for 2 weeks. She responded well to IV diuretics. Discharge weight 226 . Discharged on Lasix 40 mg bid.   Echo 08/12/11: Left ventricle: mildly dilated, EF 20% to 25%. Diffuse hypokinesis. Doppler parameters are consistent with restrictive physiology, indicative ofdecreased left ventricular diastolic compliance and/or increased left atrial pressure. Left atrium was moderately dilated. Right atrium mildly dilated.  She returns for follow up. Last visit Carvedilol increased at night 12.5 mg. She required Metolazone 2.5 mg for two days. SOB with exertion. + Orthopnea. Denies PND/CP. Denies dizziness. SOB when outside in the heat. Weight at home 225-230 pounds. Taking lasix 60 mg in am 40 mg in pm.  Wears CPAP at night. Compliant with medication. She did not take medications this am.   ROS: All systems negative except as listed in HPI, PMH and Problem List.  Past Medical History  Diagnosis Date  . 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 DYSLIPIDEMIA 05/01/2009  . OBESITY 07/24/2007  . OBSTRUCTIVE SLEEP APNEA 07/24/2007    with CPAP compliance  . RESTLESS LEG SYNDROME 07/24/2007  . SYSTOLIC HEART FAILURE, CHRONIC 01/05/2009  . TACHYCARDIA 10/25/2008  . Palpitations 09/20/2010  . 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 Hypertension   . 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 Previous EF 25%. ECHO 6/08 EF 55% ECHO 5/10 EF 30-35% MRI 7/10 EF 45% Myoview 10/11 59% no ischemia  . History of breast cancer 2006    with return in 2008  . Osteoarthritis   . Anxiety   . Depression     followed by a psychiatrist  .  Asthma   . GERD (gastroesophageal reflux disease)   . Gastroparesis   . IBS (irritable bowel syndrome)     Current Outpatient Prescriptions  Medication Sig Dispense Refill  . acetaminophen (TYLENOL ARTHRITIS PAIN) 650 MG CR tablet Take 1,300 mg by mouth every 8 (eight) hours as needed. pain      . albuterol (PROVENTIL HFA) 108 (90 BASE) MCG/ACT inhaler Inhale 2 puffs into the lungs 2 (two) times a week.       . atorvastatin (LIPITOR) 40 MG tablet Take 40 mg by mouth daily.        . busPIRone (BUSPAR) 30 MG tablet Take 30 mg by mouth 2 (two) times daily.       . carvedilol (COREG) 12.5 MG tablet Take 1/2 tab in am and 1 tab in pm  60 tablet  11  . citalopram (CELEXA) 20 MG tablet 2 tablets by mouth once daily       . cyclobenzaprine (FLEXERIL) 10 MG tablet Take 1 tablet (10 mg total) by mouth 2 (two) times daily as needed.  60 tablet  5  . digoxin (LANOXIN) 0.125 MG tablet Take 1 tablet (125 mcg total) by mouth daily.  30 tablet  5  . diphenhydramine-acetaminophen (TYLENOL PM) 25-500 MG TABS Take 1 tablet by mouth at bedtime as needed.        . furosemide (LASIX) 20 MG tablet Take 3 tabs in AM (60 mg) and 2 tabs in PM (40 mg)    150 tablet  6  . Glucosamine 500 MG CAPS Take 1 capsule by mouth daily.        . insulin lispro protamine-insulin lispro (HUMALOG 75/25) (75-25) 100 UNIT/ML SUSP 95 units with breakfast, and 95 units with the evening meal.      . Insulin Pen Needle (B-D ULTRAFINE III SHORT PEN) 31G X 8 MM MISC 18/day as directed dx 250.00  500 each  11  . levothyroxine (SYNTHROID, LEVOTHROID) 75 MCG tablet Take 75 mcg by mouth daily.        . loperamide (IMODIUM A-D) 2 MG tablet Take 2 mg by mouth as needed. diarrhea      . metolazone (ZAROXOLYN) 2.5 MG tablet Take 1 tablet (2.5 mg total) by mouth as directed.  10 tablet  1  . mometasone (NASONEX) 50 MCG/ACT nasal spray Place 2 sprays into the nose daily as needed. allergies      . nitroGLYCERIN (NITROSTAT) 0.4 MG SL tablet Place 1  tablet (0.4 mg total) under the tongue as needed.  25 tablet  6  . omega-3 acid ethyl esters (LOVAZA) 1 G capsule Take 4 capsules by mouth daily.        . pantoprazole (PROTONIX) 40 MG tablet Take 40 mg by mouth 2 (two) times daily.        . potassium chloride SA (K-DUR,KLOR-CON) 20 MEQ tablet Take 60 mEq by mouth 2 (two) times daily.       . ramipril (ALTACE) 2.5 MG tablet Take 1 tablet (2.5 mg total) by mouth Nightly.      . rOPINIRole (REQUIP) 1 MG tablet Take 1 mg by mouth at bedtime.        . spironolactone (ALDACTONE) 25 MG tablet Take 1 tablet (25 mg total) by mouth daily.  30 tablet  3  . SUMAtriptan (IMITREX) 25 MG tablet Take 25 mg by mouth every 2 (two) hours as needed.      . tiotropium (SPIRIVA) 18 MCG inhalation capsule Place 18 mcg into inhaler and inhale daily.        . traMADol (ULTRAM) 50 MG tablet Take 50 mg by mouth every 6 (six) hours as needed. pain      . traZODone (DESYREL) 50 MG tablet Take 100 mg by mouth 2 (two) times daily. Can take extra tablet if needed.      . DISCONTD: insulin lispro protamine-insulin lispro (HUMALOG MIX 75/25 KWIKPEN) (75-25) 100 UNIT/ML SUSP Inject 250 Units into the skin 2 (two) times daily with a meal. (with first and last meals of the day).  180 mL  12     PHYSICAL EXAM: Filed Vitals:   01/29/12 1123  BP: 94/58  Pulse: 110  Weight: 229 lb 8 oz (104.101 kg)  SpO2: 95%   General:  Obese, Well appearing. No resp difficulty HEENT: normal Neck: supple. JVP difficult to assess. Carotids 2+ bilaterally; no bruits. No lymphadenopathy or thryomegaly appreciated. Cor: PMI normal. Tachycardic Regular rate & rhythm. soft S3. Lungs: clear Abdomen: soft, obese, nontender,mildly distended. No hepatosplenomegaly. No bruits or masses. Good bowel sounds. Extremities: no cyanosis, clubbing, rash, tr edema Neuro: alert & orientedx3, cranial nerves grossly intact. Moves all 4 extremities w/o difficulty. Affect pleasant.    ASSESSMENT & PLAN:  

## 2012-01-29 NOTE — Assessment & Plan Note (Addendum)
Remains dyspneic on exertion. Volume status does not appear elevated. Schedule RHC to further assess hemodynamics. RHC 01/30/12.   Attending: Patient seen and examined with Tonye Becket, NP. We discussed all aspects of the encounter. I agree with the assessment and plan as stated above. She has significant exertional dyspnea but volume status looks ok on exam. Remains quite tenuous. I am concerned about low output HF. We have discussed RHC. She is agreeable to proceed. Will schedule for tomorrow.

## 2012-01-30 ENCOUNTER — Inpatient Hospital Stay (HOSPITAL_BASED_OUTPATIENT_CLINIC_OR_DEPARTMENT_OTHER)
Admission: AD | Admit: 2012-01-30 | Discharge: 2012-01-30 | Disposition: A | Discharge status: Hospice | Payer: Medicare Other | Source: Ambulatory Visit | Attending: Internal Medicine | Admitting: Internal Medicine

## 2012-01-30 ENCOUNTER — Encounter (HOSPITAL_BASED_OUTPATIENT_CLINIC_OR_DEPARTMENT_OTHER): Payer: Self-pay | Admitting: Internal Medicine

## 2012-01-30 ENCOUNTER — Encounter (HOSPITAL_BASED_OUTPATIENT_CLINIC_OR_DEPARTMENT_OTHER)
Admission: AD | Disposition: A | Discharge status: Hospice | Payer: Self-pay | Source: Ambulatory Visit | Attending: Internal Medicine

## 2012-01-30 ENCOUNTER — Inpatient Hospital Stay (HOSPITAL_COMMUNITY)
Admission: AD | Admit: 2012-01-30 | Discharge: 2012-02-07 | DRG: 286 | Disposition: A | Discharge status: Home Health | Payer: Medicare Other | Source: Ambulatory Visit | Attending: Internal Medicine | Admitting: Internal Medicine

## 2012-01-30 DIAGNOSIS — Z87891 Personal history of nicotine dependence: Secondary | ICD-10-CM

## 2012-01-30 DIAGNOSIS — Z8249 Family history of ischemic heart disease and other diseases of the circulatory system: Secondary | ICD-10-CM

## 2012-01-30 DIAGNOSIS — Z794 Long term (current) use of insulin: Secondary | ICD-10-CM

## 2012-01-30 DIAGNOSIS — E876 Hypokalemia: Secondary | ICD-10-CM | POA: Diagnosis present

## 2012-01-30 DIAGNOSIS — T451X5A Adverse effect of antineoplastic and immunosuppressive drugs, initial encounter: Secondary | ICD-10-CM | POA: Insufficient documentation

## 2012-01-30 DIAGNOSIS — J9620 Acute and chronic respiratory failure, unspecified whether with hypoxia or hypercapnia: Secondary | ICD-10-CM | POA: Diagnosis present

## 2012-01-30 DIAGNOSIS — I428 Other cardiomyopathies: Secondary | ICD-10-CM | POA: Insufficient documentation

## 2012-01-30 DIAGNOSIS — F3289 Other specified depressive episodes: Secondary | ICD-10-CM | POA: Diagnosis present

## 2012-01-30 DIAGNOSIS — R570 Cardiogenic shock: Secondary | ICD-10-CM

## 2012-01-30 DIAGNOSIS — G2581 Restless legs syndrome: Secondary | ICD-10-CM | POA: Diagnosis present

## 2012-01-30 DIAGNOSIS — E785 Hyperlipidemia, unspecified: Secondary | ICD-10-CM | POA: Diagnosis present

## 2012-01-30 DIAGNOSIS — T50995A Adverse effect of other drugs, medicaments and biological substances, initial encounter: Secondary | ICD-10-CM | POA: Diagnosis present

## 2012-01-30 DIAGNOSIS — K219 Gastro-esophageal reflux disease without esophagitis: Secondary | ICD-10-CM | POA: Diagnosis present

## 2012-01-30 DIAGNOSIS — G4733 Obstructive sleep apnea (adult) (pediatric): Secondary | ICD-10-CM | POA: Insufficient documentation

## 2012-01-30 DIAGNOSIS — Z886 Allergy status to analgesic agent status: Secondary | ICD-10-CM

## 2012-01-30 DIAGNOSIS — J9601 Acute respiratory failure with hypoxia: Secondary | ICD-10-CM

## 2012-01-30 DIAGNOSIS — Z9221 Personal history of antineoplastic chemotherapy: Secondary | ICD-10-CM

## 2012-01-30 DIAGNOSIS — Z882 Allergy status to sulfonamides status: Secondary | ICD-10-CM

## 2012-01-30 DIAGNOSIS — R5381 Other malaise: Secondary | ICD-10-CM | POA: Diagnosis present

## 2012-01-30 DIAGNOSIS — K589 Irritable bowel syndrome without diarrhea: Secondary | ICD-10-CM | POA: Insufficient documentation

## 2012-01-30 DIAGNOSIS — Z853 Personal history of malignant neoplasm of breast: Secondary | ICD-10-CM

## 2012-01-30 DIAGNOSIS — E669 Obesity, unspecified: Secondary | ICD-10-CM | POA: Insufficient documentation

## 2012-01-30 DIAGNOSIS — Z888 Allergy status to other drugs, medicaments and biological substances status: Secondary | ICD-10-CM

## 2012-01-30 DIAGNOSIS — Z6835 Body mass index (BMI) 35.0-35.9, adult: Secondary | ICD-10-CM

## 2012-01-30 DIAGNOSIS — E119 Type 2 diabetes mellitus without complications: Secondary | ICD-10-CM | POA: Diagnosis present

## 2012-01-30 DIAGNOSIS — F411 Generalized anxiety disorder: Secondary | ICD-10-CM | POA: Diagnosis present

## 2012-01-30 DIAGNOSIS — I509 Heart failure, unspecified: Secondary | ICD-10-CM | POA: Insufficient documentation

## 2012-01-30 DIAGNOSIS — F329 Major depressive disorder, single episode, unspecified: Secondary | ICD-10-CM | POA: Diagnosis present

## 2012-01-30 DIAGNOSIS — R000 Tachycardia, unspecified: Secondary | ICD-10-CM | POA: Diagnosis present

## 2012-01-30 DIAGNOSIS — I5023 Acute on chronic systolic (congestive) heart failure: Principal | ICD-10-CM

## 2012-01-30 DIAGNOSIS — I5022 Chronic systolic (congestive) heart failure: Secondary | ICD-10-CM

## 2012-01-30 DIAGNOSIS — Z833 Family history of diabetes mellitus: Secondary | ICD-10-CM

## 2012-01-30 DIAGNOSIS — Z9010 Acquired absence of unspecified breast and nipple: Secondary | ICD-10-CM

## 2012-01-30 DIAGNOSIS — Z79899 Other long term (current) drug therapy: Secondary | ICD-10-CM

## 2012-01-30 DIAGNOSIS — J45909 Unspecified asthma, uncomplicated: Secondary | ICD-10-CM | POA: Diagnosis present

## 2012-01-30 DIAGNOSIS — I10 Essential (primary) hypertension: Secondary | ICD-10-CM | POA: Insufficient documentation

## 2012-01-30 DIAGNOSIS — Z881 Allergy status to other antibiotic agents status: Secondary | ICD-10-CM

## 2012-01-30 DIAGNOSIS — E032 Hypothyroidism due to medicaments and other exogenous substances: Secondary | ICD-10-CM | POA: Diagnosis present

## 2012-01-30 LAB — POCT I-STAT 3, ART BLOOD GAS (G3+)
Acid-Base Excess: 4 mmol/L — ABNORMAL HIGH (ref 0.0–2.0)
Bicarbonate: 28.6 mEq/L — ABNORMAL HIGH (ref 20.0–24.0)
pH, Arterial: 7.46 — ABNORMAL HIGH (ref 7.350–7.400)

## 2012-01-30 LAB — POCT I-STAT GLUCOSE
Glucose, Bld: 163 mg/dL — ABNORMAL HIGH (ref 70–99)
Operator id: 141321

## 2012-01-30 LAB — CBC
HCT: 39.5 % (ref 36.0–46.0)
MCH: 27.8 pg (ref 26.0–34.0)
MCHC: 31.6 g/dL (ref 30.0–36.0)
MCV: 87.8 fL (ref 78.0–100.0)
RDW: 16.8 % — ABNORMAL HIGH (ref 11.5–15.5)
WBC: 8.8 10*3/uL (ref 4.0–10.5)

## 2012-01-30 LAB — POCT I-STAT 3, VENOUS BLOOD GAS (G3P V)
Acid-Base Excess: 1 mmol/L (ref 0.0–2.0)
Acid-Base Excess: 3 mmol/L — ABNORMAL HIGH (ref 0.0–2.0)
Bicarbonate: 27 mEq/L — ABNORMAL HIGH (ref 20.0–24.0)
Bicarbonate: 29.1 mEq/L — ABNORMAL HIGH (ref 20.0–24.0)
O2 Saturation: 42 %
O2 Saturation: 48 %
pCO2, Ven: 45.3 mmHg (ref 45.0–50.0)
pCO2, Ven: 47.7 mmHg (ref 45.0–50.0)
pH, Ven: 7.4 — ABNORMAL HIGH (ref 7.250–7.300)
pO2, Ven: 26 mmHg — CL (ref 30.0–45.0)

## 2012-01-30 LAB — CREATININE, SERUM: GFR calc Af Amer: >90 mL/min (ref >90)

## 2012-01-30 LAB — MRSA PCR SCREENING: MRSA by PCR: POSITIVE — AB

## 2012-01-30 SURGERY — JV RIGHT HEART CATHETERIZATION
Anesthesia: Moderate Sedation

## 2012-01-30 MED ORDER — LEVOTHYROXINE SODIUM 25 MCG PO TABS
75.0000 ug | ORAL_TABLET | Freq: Every day | ORAL | Status: DC
  Filled 2012-01-30: qty 3

## 2012-01-30 MED ORDER — ENOXAPARIN SODIUM 40 MG/0.4ML ~~LOC~~ SOLN: 40.0000 mg | SUBCUTANEOUS | Status: DC

## 2012-01-30 MED ORDER — SODIUM CHLORIDE 0.9 % IV SOLN
INTRAVENOUS | Status: DC
  Administered 2012-01-30: 12:00:00 via INTRAVENOUS

## 2012-01-30 MED ORDER — ENOXAPARIN SODIUM 30 MG/0.3ML ~~LOC~~ SOLN
40.0000 mg | SUBCUTANEOUS | Status: DC
  Administered 2012-01-30 – 2012-02-06 (×8): 40 mg via SUBCUTANEOUS
  Filled 2012-01-30 (×10): qty 0.4

## 2012-01-30 MED ORDER — ACETAMINOPHEN 325 MG PO TABS
650.0000 mg | ORAL_TABLET | ORAL | Status: DC | PRN
  Administered 2012-01-31 – 2012-02-06 (×2): 650 mg via ORAL
  Filled 2012-01-30 (×2): qty 2
  Filled 2012-01-30 (×2): qty 1

## 2012-01-30 MED ORDER — SODIUM CHLORIDE 0.9 % IJ SOLN: 3.0000 mL | INTRAMUSCULAR | Status: DC | PRN

## 2012-01-30 MED ORDER — MUPIROCIN 2 % EX OINT
1.0000 "application " | TOPICAL_OINTMENT | Freq: Two times a day (BID) | CUTANEOUS | Status: AC
  Administered 2012-01-30 – 2012-02-04 (×10): 1 via NASAL
  Filled 2012-01-30: qty 22

## 2012-01-30 MED ORDER — TIOTROPIUM BROMIDE MONOHYDRATE 18 MCG IN CAPS
18.0000 ug | ORAL_CAPSULE | Freq: Every day | RESPIRATORY_TRACT | Status: DC
  Administered 2012-01-31 – 2012-02-07 (×8): 18 ug via RESPIRATORY_TRACT
  Filled 2012-01-30 (×2): qty 5

## 2012-01-30 MED ORDER — SODIUM CHLORIDE 0.9 % IV SOLN
250.0000 mL | INTRAVENOUS | Status: DC | PRN
  Administered 2012-02-06: 07:00:00 via INTRAVENOUS

## 2012-01-30 MED ORDER — LEVOTHYROXINE SODIUM 75 MCG PO TABS
75.0000 ug | ORAL_TABLET | Freq: Every day | ORAL | Status: DC
  Administered 2012-01-31 – 2012-02-07 (×8): 75 ug via ORAL
  Filled 2012-01-30 (×9): qty 1

## 2012-01-30 MED ORDER — SODIUM CHLORIDE 0.9 % IJ SOLN
3.0000 mL | Freq: Two times a day (BID) | INTRAMUSCULAR | Status: DC
  Administered 2012-01-31 – 2012-02-01 (×2): 3 mL via INTRAVENOUS
  Administered 2012-02-01: 13:00:00 via INTRAVENOUS
  Administered 2012-02-03 – 2012-02-06 (×5): 3 mL via INTRAVENOUS

## 2012-01-30 MED ORDER — INSULIN ASPART 100 UNIT/ML ~~LOC~~ SOLN
0.0000 [IU] | Freq: Three times a day (TID) | SUBCUTANEOUS | Status: DC
  Administered 2012-01-31: 3 [IU] via SUBCUTANEOUS
  Administered 2012-01-31 (×2): 11 [IU] via SUBCUTANEOUS
  Administered 2012-02-01: 8 [IU] via SUBCUTANEOUS
  Administered 2012-02-01: 15 [IU] via SUBCUTANEOUS
  Administered 2012-02-01 – 2012-02-02 (×2): 8 [IU] via SUBCUTANEOUS
  Administered 2012-02-02: 11 [IU] via SUBCUTANEOUS
  Administered 2012-02-02 – 2012-02-03 (×2): 5 [IU] via SUBCUTANEOUS
  Administered 2012-02-03: 8 [IU] via SUBCUTANEOUS
  Administered 2012-02-03 – 2012-02-04 (×2): 11 [IU] via SUBCUTANEOUS
  Administered 2012-02-04 (×2): 8 [IU] via SUBCUTANEOUS
  Administered 2012-02-05: 3 [IU] via SUBCUTANEOUS
  Administered 2012-02-05: 5 [IU] via SUBCUTANEOUS
  Administered 2012-02-05: 11 [IU] via SUBCUTANEOUS
  Administered 2012-02-06 – 2012-02-07 (×4): 3 [IU] via SUBCUTANEOUS

## 2012-01-30 MED ORDER — BUSPIRONE HCL 5 MG PO TABS
30.0000 mg | ORAL_TABLET | Freq: Two times a day (BID) | ORAL | Status: DC
  Administered 2012-01-30 – 2012-02-06 (×15): 30 mg via ORAL
  Filled 2012-01-30 (×17): qty 6

## 2012-01-30 MED ORDER — POTASSIUM CHLORIDE CRYS ER 10 MEQ PO TBCR
60.0000 meq | EXTENDED_RELEASE_TABLET | Freq: Two times a day (BID) | ORAL | Status: DC
  Filled 2012-01-30: qty 6

## 2012-01-30 MED ORDER — RAMIPRIL 1.25 MG PO CAPS
2.5000 mg | ORAL_CAPSULE | Freq: Every day | ORAL | Status: DC
  Administered 2012-01-31: 2.5 mg via ORAL
  Filled 2012-01-30: qty 2

## 2012-01-30 MED ORDER — SPIRONOLACTONE 12.5 MG HALF TABLET
25.0000 mg | ORAL_TABLET | Freq: Every day | ORAL | Status: DC
  Administered 2012-01-31 – 2012-02-06 (×7): 25 mg via ORAL
  Filled 2012-01-30 (×8): qty 2

## 2012-01-30 MED ORDER — POTASSIUM CHLORIDE CRYS ER 20 MEQ PO TBCR
60.0000 meq | EXTENDED_RELEASE_TABLET | Freq: Two times a day (BID) | ORAL | Status: DC
  Administered 2012-01-30 – 2012-02-06 (×15): 60 meq via ORAL
  Filled 2012-01-30 (×18): qty 3

## 2012-01-30 MED ORDER — DIAZEPAM 5 MG PO TABS
5.0000 mg | ORAL_TABLET | ORAL | Status: AC
  Administered 2012-01-30: 5 mg via ORAL

## 2012-01-30 MED ORDER — PANTOPRAZOLE SODIUM 20 MG PO TBEC
40.0000 mg | DELAYED_RELEASE_TABLET | Freq: Two times a day (BID) | ORAL | Status: DC
  Filled 2012-01-30 (×2): qty 2

## 2012-01-30 MED ORDER — OMEGA-3-ACID ETHYL ESTERS 1 G PO CAPS
2.0000 g | ORAL_CAPSULE | Freq: Two times a day (BID) | ORAL | Status: DC
  Administered 2012-01-31 – 2012-02-06 (×14): 2 g via ORAL
  Filled 2012-01-30 (×18): qty 2

## 2012-01-30 MED ORDER — PANTOPRAZOLE SODIUM 40 MG PO TBEC
40.0000 mg | DELAYED_RELEASE_TABLET | Freq: Two times a day (BID) | ORAL | Status: DC
  Administered 2012-01-31 – 2012-02-07 (×15): 40 mg via ORAL
  Filled 2012-01-30 (×14): qty 1

## 2012-01-30 MED ORDER — ONDANSETRON HCL 4 MG/2ML IJ SOLN: 4.0000 mg | Freq: Four times a day (QID) | INTRAMUSCULAR | Status: DC | PRN

## 2012-01-30 MED ORDER — ROPINIROLE HCL 1 MG PO TABS
1.0000 mg | ORAL_TABLET | ORAL | Status: AC
  Administered 2012-01-30: 1 mg via ORAL
  Filled 2012-01-30: qty 1

## 2012-01-30 MED ORDER — SODIUM CHLORIDE 0.9 % IJ SOLN: 3.0000 mL | Freq: Two times a day (BID) | INTRAMUSCULAR | Status: DC

## 2012-01-30 MED ORDER — CITALOPRAM HYDROBROMIDE 10 MG PO TABS
40.0000 mg | ORAL_TABLET | Freq: Every day | ORAL | Status: DC
  Administered 2012-01-31 – 2012-02-06 (×7): 40 mg via ORAL
  Filled 2012-01-30 (×9): qty 4

## 2012-01-30 MED ORDER — DIGOXIN 0.0625 MG HALF TABLET
0.1250 mg | ORAL_TABLET | Freq: Every day | ORAL | Status: DC
  Administered 2012-01-31 – 2012-02-06 (×7): 0.125 mg via ORAL
  Filled 2012-01-30 (×12): qty 2

## 2012-01-30 MED ORDER — ACETAMINOPHEN 325 MG PO TABS: 650.0000 mg | ORAL_TABLET | ORAL | Status: DC | PRN

## 2012-01-30 MED ORDER — FUROSEMIDE 10 MG/ML IJ SOLN
80.0000 mg | Freq: Two times a day (BID) | INTRAMUSCULAR | Status: DC
  Administered 2012-01-30 – 2012-02-03 (×9): 80 mg via INTRAVENOUS
  Filled 2012-01-30 (×13): qty 8

## 2012-01-30 MED ORDER — MILRINONE IN DEXTROSE 200-5 MCG/ML-% IV SOLN
0.2500 ug/kg/min | INTRAVENOUS | Status: DC
  Administered 2012-01-30: 0.25 ug/kg/min via INTRAVENOUS

## 2012-01-30 MED ORDER — ATORVASTATIN CALCIUM 10 MG PO TABS
40.0000 mg | ORAL_TABLET | Freq: Every day | ORAL | Status: DC
  Administered 2012-01-30 – 2012-02-06 (×8): 40 mg via ORAL
  Filled 2012-01-30 (×9): qty 4

## 2012-01-30 MED ORDER — ROPINIROLE HCL 0.25 MG PO TABS
1.0000 mg | ORAL_TABLET | Freq: Every day | ORAL | Status: DC
  Filled 2012-01-30: qty 4

## 2012-01-30 MED ORDER — SODIUM CHLORIDE 0.9 % IV SOLN: 250.0000 mL | INTRAVENOUS | Status: DC | PRN

## 2012-01-30 MED ORDER — CHLORHEXIDINE GLUCONATE CLOTH 2 % EX PADS
6.0000 | MEDICATED_PAD | Freq: Every day | CUTANEOUS | Status: AC
  Administered 2012-01-30 – 2012-02-02 (×3): 6 via TOPICAL

## 2012-01-30 MED ORDER — SODIUM CHLORIDE 0.9 % IV SOLN
4.0000 mg | Freq: Four times a day (QID) | INTRAVENOUS | Status: DC | PRN
  Filled 2012-01-30: qty 2

## 2012-01-30 MED ORDER — ROPINIROLE HCL 1 MG PO TABS
1.0000 mg | ORAL_TABLET | Freq: Every day | ORAL | Status: DC
  Administered 2012-01-31 – 2012-02-06 (×7): 1 mg via ORAL
  Filled 2012-01-30 (×8): qty 1

## 2012-01-30 NOTE — H&P (View-Only) (Signed)
Patient ID: [name], female   DOB: [birth year], 56 y.o.   MRN: [id number] HPI: 56 year old female with history of breast cancer, obesity, HTN, diabetes, IBS, and OSA. She had chemo induced cardiomyopathy. Cardiac MRI in July 2010 EF 45%. Myoview in 10/11 EF 59%. No ischemia.   Admitted 08/2011 for ADHF after she had run out medications for 2 weeks. She responded well to IV diuretics. Discharge weight 226 . Discharged on Lasix 40 mg bid.   Echo 08/12/11: Left ventricle: mildly dilated, EF 20% to 25%. Diffuse hypokinesis. Doppler parameters are consistent with restrictive physiology, indicative ofdecreased left ventricular diastolic compliance and/or increased left atrial pressure. Left atrium was moderately dilated. Right atrium mildly dilated.  She returns for follow up. Last visit Carvedilol increased at night 12.5 mg. She required Metolazone 2.5 mg for two days. SOB with exertion. + Orthopnea. Denies PND/CP. Denies dizziness. SOB when outside in the heat. Weight at home 225-230 pounds. Taking lasix 60 mg in am 40 mg in pm.  Wears CPAP at night. Compliant with medication. She did not take medications this am.   ROS: All systems negative except as listed in HPI, PMH and Problem List.  Past Medical History  Diagnosis Date  . 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 DYSLIPIDEMIA 05/01/2009  . OBESITY 07/24/2007  . OBSTRUCTIVE SLEEP APNEA 07/24/2007    with CPAP compliance  . RESTLESS LEG SYNDROME 07/24/2007  . SYSTOLIC HEART FAILURE, CHRONIC 01/05/2009  . TACHYCARDIA 10/25/2008  . Palpitations 09/20/2010  . 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 Hypertension   . 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 Previous EF 25%. ECHO 6/08 EF 55% ECHO 5/10 EF 30-35% MRI 7/10 EF 45% Myoview 10/11 59% no ischemia  . History of breast cancer 2006    with return in 2008  . Osteoarthritis   . Anxiety   . Depression     followed by a psychiatrist  .  Asthma   . GERD (gastroesophageal reflux disease)   . Gastroparesis   . IBS (irritable bowel syndrome)     Current Outpatient Prescriptions  Medication Sig Dispense Refill  . acetaminophen (TYLENOL ARTHRITIS PAIN) 650 MG CR tablet Take 1,300 mg by mouth every 8 (eight) hours as needed. pain      . albuterol (PROVENTIL HFA) 108 (90 BASE) MCG/ACT inhaler Inhale 2 puffs into the lungs 2 (two) times a week.       Marland Kitchen atorvastatin (LIPITOR) 40 MG tablet Take 40 mg by mouth daily.        . busPIRone (BUSPAR) 30 MG tablet Take 30 mg by mouth 2 (two) times daily.       . carvedilol (COREG) 12.5 MG tablet Take 1/2 tab in am and 1 tab in pm  60 tablet  11  . citalopram (CELEXA) 20 MG tablet 2 tablets by mouth once daily       . cyclobenzaprine (FLEXERIL) 10 MG tablet Take 1 tablet (10 mg total) by mouth 2 (two) times daily as needed.  60 tablet  5  . digoxin (LANOXIN) 0.125 MG tablet Take 1 tablet (125 mcg total) by mouth daily.  30 tablet  5  . diphenhydramine-acetaminophen (TYLENOL PM) 25-500 MG TABS Take 1 tablet by mouth at bedtime as needed.        . furosemide (LASIX) 20 MG tablet Take 3 tabs in AM (60 mg) and 2 tabs in PM (40 mg)  150 tablet  6  . Glucosamine 500 MG CAPS Take 1 capsule by mouth daily.        . insulin lispro protamine-insulin lispro (HUMALOG 75/25) (75-25) 100 UNIT/ML SUSP 95 units with breakfast, and 95 units with the evening meal.      . Insulin Pen Needle (B-D ULTRAFINE III SHORT PEN) 31G X 8 MM MISC 18/day as directed dx 250.00  500 each  11  . levothyroxine (SYNTHROID, LEVOTHROID) 75 MCG tablet Take 75 mcg by mouth daily.        Marland Kitchen loperamide (IMODIUM A-D) 2 MG tablet Take 2 mg by mouth as needed. diarrhea      . metolazone (ZAROXOLYN) 2.5 MG tablet Take 1 tablet (2.5 mg total) by mouth as directed.  10 tablet  1  . mometasone (NASONEX) 50 MCG/ACT nasal spray Place 2 sprays into the nose daily as needed. allergies      . nitroGLYCERIN (NITROSTAT) 0.4 MG SL tablet Place 1  tablet (0.4 mg total) under the tongue as needed.  25 tablet  6  . omega-3 acid ethyl esters (LOVAZA) 1 G capsule Take 4 capsules by mouth daily.        . pantoprazole (PROTONIX) 40 MG tablet Take 40 mg by mouth 2 (two) times daily.        . potassium chloride SA (K-DUR,KLOR-CON) 20 MEQ tablet Take 60 mEq by mouth 2 (two) times daily.       . ramipril (ALTACE) 2.5 MG tablet Take 1 tablet (2.5 mg total) by mouth Nightly.      Marland Kitchen rOPINIRole (REQUIP) 1 MG tablet Take 1 mg by mouth at bedtime.        Marland Kitchen spironolactone (ALDACTONE) 25 MG tablet Take 1 tablet (25 mg total) by mouth daily.  30 tablet  3  . SUMAtriptan (IMITREX) 25 MG tablet Take 25 mg by mouth every 2 (two) hours as needed.      . tiotropium (SPIRIVA) 18 MCG inhalation capsule Place 18 mcg into inhaler and inhale daily.        . traMADol (ULTRAM) 50 MG tablet Take 50 mg by mouth every 6 (six) hours as needed. pain      . traZODone (DESYREL) 50 MG tablet Take 100 mg by mouth 2 (two) times daily. Can take extra tablet if needed.      Marland Kitchen DISCONTD: insulin lispro protamine-insulin lispro (HUMALOG MIX 75/25 KWIKPEN) (75-25) 100 UNIT/ML SUSP Inject 250 Units into the skin 2 (two) times daily with a meal. (with first and last meals of the day).  180 mL  12     PHYSICAL EXAM: Filed Vitals:   01/29/12 1123  BP: 94/58  Pulse: 110  Weight: 229 lb 8 oz (104.101 kg)  SpO2: 95%   General:  Obese, Well appearing. No resp difficulty HEENT: normal Neck: supple. JVP difficult to assess. Carotids 2+ bilaterally; no bruits. No lymphadenopathy or thryomegaly appreciated. Cor: PMI normal. Tachycardic Regular rate & rhythm. soft S3. Lungs: clear Abdomen: soft, obese, nontender,mildly distended. No hepatosplenomegaly. No bruits or masses. Good bowel sounds. Extremities: no cyanosis, clubbing, rash, tr edema Neuro: alert & orientedx3, cranial nerves grossly intact. Moves all 4 extremities w/o difficulty. Affect pleasant.    ASSESSMENT & PLAN:

## 2012-01-30 NOTE — CV Procedure (Signed)
Cardiac Cath Procedure Note:  Indication:  Heart failure  Procedures performed:  1) Right heart catheterization  Description of procedure:   The risks and indication of the procedure were explained. Consent was signed and placed on the chart. An appropriate timeout was taken prior to the procedure. The right groin was prepped and draped in the routine sterile fashion and anesthetized with 1% local lidocaine.   A 7 FR venous sheath was placed in the right femoral vein using a modified Seldinger technique. A standard Swan-Ganz catheter was used for the procedure.   Complications: None apparent.  Findings:  RA = 20 RV = 55/13/22 PA =56/34 (44) PCW = 28 Fick cardiac output/index = 3.1/1.5 PVR = 5.3 Woods FA sat = 91%  PA sat = 42%, 48% SVC sat = 46%  Assessment:  1. Decompensated HF with low cardiac output c/w cardiogenic shock  Plan/Discussion:  Admit for IV diuresis and inotropic support.  Willem Klingensmith 1:15 PM

## 2012-01-30 NOTE — Progress Notes (Signed)
Transported to 2923, report called to [place].

## 2012-01-30 NOTE — Progress Notes (Signed)
Bedrest begins @ 1320.  Tegaderm dressing applied post cath.

## 2012-01-30 NOTE — H&P (Signed)
Advanced Heart Failure Team History and Physical Note   Primary Physician:  Primary Cardiologist: Dr. Gala Romney  Reason for Admission: Cardiogenic shock and volume overload.    HPI:    [name] is a 56 year old female with history of breast cancer, obesity, HTN, diabetes, IBS, and OSA with CPAP use. She had chemo induced cardiomyopathy.  Previous evaluation included cardiac MRI in July 2010 with EF 45% with myoview in 2011 showing EF 59% but no ischemia.   Most recent echo in Jan 2013 where EF has fallen to 20-25% with diffuse hypokinesis.  Doppler parameters were also consistent with restrictive physiology.    She has been followed closely in the Heart Failure clinic.  She was recently evaluated for orthopnea, weight gain and progressive dyspnea on exertion.  She denies chest pain, PND or syncope.  Her dyspnea was felt out of proportion to her physical exam and there were questions of low output HF therefore RHC was scheduled.  She was brought to the outpatient cath lab today for RHC.  Cath demonstrated decompensated HF with low cardiac output consistent with cardiogenic shock.  She will be admitted for IV diuresis with inotrope support.    RHC 6/27 RA = 20  RV = 55/13/22  PA =56/34 (44)  PCW = 28  Fick cardiac output/index = 3.1/1.5  PVR = 5.3 Woods  FA sat = 91%  PA sat = 42%, 48%  SVC sat = 46%   Review of Systems: [y] = yes, [ ]  = no   General: Weight gain Cove.Etienne ]; Weight loss [ ] ; Anorexia [ ] ; Fatigue Cove.Etienne ]; Fever [ ] ; Chills [ ] ; Weakness Cove.Etienne ]  Cardiac: Chest pain/pressure [ ] ; Resting SOB [ ] ; Exertional SOB Cove.Etienne ]; Orthopnea Cove.Etienne ]; Pedal Edema [ ] ; Palpitations [ ] ; Syncope [ ] ; Presyncope [ ] ; Paroxysmal nocturnal dyspnea[ ]   Pulmonary: Cough [ ] ; Wheezing[ ] ; Hemoptysis[ ] ; Sputum [ ] ; Snoring [ ]   GI: Vomiting[ ] ; Dysphagia[ ] ; Melena[ ] ; Hematochezia [ ] ; Heartburn[ ] ; Abdominal pain [ ] ; Constipation [ ] ; Diarrhea [ ] ; BRBPR [ ]   GU: Hematuria[ ] ; Dysuria [ ] ; Nocturia[ ]     Vascular: Pain in legs with walking [ ] ; Pain in feet with lying flat [ ] ; Non-healing sores [ ] ; Stroke [ ] ; TIA [ ] ; Slurred speech [ ] ;  Neuro: Headaches[ ] ; Vertigo[ ] ; Seizures[ ] ; Paresthesias[ ] ;Blurred vision [ ] ; Diplopia [ ] ; Vision changes [ ]   Ortho/Skin: Arthritis [ ] ; Joint pain [ ] ; Muscle pain [ ] ; Joint swelling [ ] ; Back Pain [ ] ; Rash [ ]   Psych: Depression[ ] ; Anxiety[ ]   Heme: Bleeding problems [ ] ; Clotting disorders [ ] ; Anemia [ ]   Endocrine: Diabetes [ ] ; Thyroid dysfunction[ ]   Home Medications Prior to Admission medications   Medication Sig Start Date End Date Taking? Authorizing Provider  acetaminophen (TYLENOL ARTHRITIS PAIN) 650 MG CR tablet Take 1,300 mg by mouth every 8 (eight) hours as needed. pain    Historical Provider, MD  albuterol (PROVENTIL HFA) 108 (90 BASE) MCG/ACT inhaler Inhale 2 puffs into the lungs 2 (two) times a week.     Historical Provider, MD  atorvastatin (LIPITOR) 40 MG tablet Take 40 mg by mouth daily.      Historical Provider, MD  busPIRone (BUSPAR) 30 MG tablet Take 30 mg by mouth 2 (two) times daily.     Historical Provider, MD  carvedilol (COREG) 12.5 MG tablet Take 1/2 tab in  am and 1 tab in pm 01/17/12   Amy D Clegg, NP  citalopram (CELEXA) 20 MG tablet 2 tablets by mouth once daily     Historical Provider, MD  cyclobenzaprine (FLEXERIL) 10 MG tablet Take 1 tablet (10 mg total) by mouth 2 (two) times daily as needed. 10/28/11   Romero Belling, MD  digoxin (LANOXIN) 0.125 MG tablet Take 1 tablet (125 mcg total) by mouth daily. 12/11/11   Bevelyn Buckles Tahmir Kleckner, MD  diphenhydramine-acetaminophen (TYLENOL PM) 25-500 MG TABS Take 1 tablet by mouth at bedtime as needed.      Historical Provider, MD  furosemide (LASIX) 20 MG tablet Take 3 tabs in AM (60 mg) and 2 tabs in PM (40 mg) 11/26/11   Dolores Patty, MD  Glucosamine 500 MG CAPS Take 1 capsule by mouth daily.      Historical Provider, MD  insulin lispro protamine-insulin lispro (HUMALOG  75/25) (75-25) 100 UNIT/ML SUSP 95 units with breakfast, and 95 units with the evening meal. 04/11/11 04/10/12  Romero Belling, MD  Insulin Pen Needle (B-D ULTRAFINE III SHORT PEN) 31G X 8 MM MISC 18/day as directed dx 250.00 08/01/11   Romero Belling, MD  levothyroxine (SYNTHROID, LEVOTHROID) 75 MCG tablet Take 75 mcg by mouth daily.      Historical Provider, MD  loperamide (IMODIUM A-D) 2 MG tablet Take 2 mg by mouth as needed. diarrhea    Historical Provider, MD  metolazone (ZAROXOLYN) 2.5 MG tablet Take 1 tablet (2.5 mg total) by mouth as directed. 01/23/12 01/22/13  Bevelyn Buckles Yancy Knoble, MD  mometasone (NASONEX) 50 MCG/ACT nasal spray Place 2 sprays into the nose daily as needed. allergies    Historical Provider, MD  nitroGLYCERIN (NITROSTAT) 0.4 MG SL tablet Place 1 tablet (0.4 mg total) under the tongue as needed. 06/17/11   Dolores Patty, MD  omega-3 acid ethyl esters (LOVAZA) 1 G capsule Take 4 capsules by mouth daily.      Historical Provider, MD  pantoprazole (PROTONIX) 40 MG tablet Take 40 mg by mouth 2 (two) times daily.      Historical Provider, MD  potassium chloride SA (K-DUR,KLOR-CON) 20 MEQ tablet Take 60 mEq by mouth 2 (two) times daily.  04/03/11   Dolores Patty, MD  ramipril (ALTACE) 2.5 MG tablet Take 1 tablet (2.5 mg total) by mouth Nightly. 11/04/11   Hadassah Pais, PA  rOPINIRole (REQUIP) 1 MG tablet Take 1 mg by mouth at bedtime.      Historical Provider, MD  spironolactone (ALDACTONE) 25 MG tablet Take 1 tablet (25 mg total) by mouth daily. 12/03/11   Dolores Patty, MD  SUMAtriptan (IMITREX) 25 MG tablet Take 25 mg by mouth every 2 (two) hours as needed.    Historical Provider, MD  tiotropium (SPIRIVA) 18 MCG inhalation capsule Place 18 mcg into inhaler and inhale daily.      Historical Provider, MD  traMADol (ULTRAM) 50 MG tablet Take 50 mg by mouth every 6 (six) hours as needed. pain    Historical Provider, MD  traZODone (DESYREL) 50 MG tablet Take 100 mg by mouth 2  (two) times daily. Can take extra tablet if needed.    Historical Provider, MD    Past Medical History: Past Medical History  Diagnosis Date  . 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 DYSLIPIDEMIA 05/01/2009  . OBESITY 07/24/2007  . OBSTRUCTIVE SLEEP APNEA 07/24/2007    with CPAP compliance  . RESTLESS LEG SYNDROME 07/24/2007  . SYSTOLIC HEART FAILURE, CHRONIC 01/05/2009  .  TACHYCARDIA 10/25/2008  . Palpitations 09/20/2010  . 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 Hypertension   . 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 Previous EF 25%. ECHO 6/08 EF 55% ECHO 5/10 EF 30-35% MRI 7/10 EF 45% Myoview 10/11 59% no ischemia  . History of breast cancer 2006    with return in 2008  . Osteoarthritis   . Anxiety   . Depression     followed by a psychiatrist  . Asthma   . GERD (gastroesophageal reflux disease)   . Gastroparesis   . IBS (irritable bowel syndrome)     Past Surgical History: Past Surgical History  Procedure Date  . Mastectomy     right  . Abdominal hysterectomy 1991    Family History: Family History  Problem Relation Age of Onset  . Diabetes Mother   . Hypertension Other   . Coronary artery disease Father 42    Social History: History   Social History  . Marital Status: Divorced    Spouse Name: N/A    Number of Children: N/A  . Years of Education: N/A   Occupational History  . Disabled    Social History Main Topics  . Smoking status: Former Games developer  . Smokeless tobacco: Former Neurosurgeon    Quit date: 03/05/2001  . Alcohol Use: No  . Drug Use: No  . Sexually Active: None   Other Topics Concern  . None   Social History Narrative   Lives with mother and brotherRegular exercise-no    Allergies:  Allergies  Allergen Reactions  . Aspirin Hives  . Ciprofloxacin Hives  . Codeine Nausea Only  . Naproxen Hives  . Sulfonamide Derivatives Hives  . Tape Rash    Also allergic to metal    Objective:     Vital Signs:   Pulse Rate:  [110-112] 110  (06/27 1350) Resp:  [16-20] 20  (06/27 1350) BP: (115-124)/(48-79) 116/79 mmHg (06/27 1350) SpO2:  [90 %-93 %] 90 % (06/27 1350) Weight:  [103.874 kg (229 lb)] 103.874 kg (229 lb) (06/27 1243)   Filed Weights   01/30/12 1243  Weight: 103.874 kg (229 lb)    Physical Exam: General: Obese, chronically ill.  No resp difficulty HEENT: normal Neck: supple. JVP difficult to assess. Carotids 2+ bilat; no bruits. No lymphadenopathy or thryomegaly appreciated. Cor: PMI nondisplaced. Tachycardic regular rhythm. Soft S3 Lungs: clear Abdomen: soft, nontender, nondistended. No hepatosplenomegaly. No bruits or masses. Good bowel sounds. Extremities: no cyanosis, clubbing, rash, tr edema Neuro: alert & orientedx3, cranial nerves grossly intact. moves all 4 extremities w/o difficulty. Affect pleasant  Labs: Basic Metabolic Panel:  Lab 01/30/12 7829 01/29/12 1230  NA -- 134*  K -- 4.1  CL -- 93*  CO2 -- 31  GLUCOSE 163* 269*  BUN -- 19  CREATININE -- 0.83  CALCIUM -- 9.9  MG -- --  PHOS -- --    Liver Function Tests: No results found for this basename: AST:5,ALT:5,ALKPHOS:5,BILITOT:5,PROT:5,ALBUMIN:5 in the last 168 hours No results found for this basename: LIPASE:5,AMYLASE:5 in the last 168 hours No results found for this basename: AMMONIA:3 in the last 168 hours  CBC:  Lab 01/29/12 1230  WBC 9.9  NEUTROABS --  HGB 13.2  HCT 40.5  MCV 87.1  PLT 281    Cardiac Enzymes: No results found for this basename: CKTOTAL:5,CKMB:5,CKMBINDEX:5,TROPONINI:5 in the last 168 hours  BNP: BNP (last 3 results)  Basename 08/13/11  0509 08/11/11 1541  PROBNP 1025.0* 868.7*    CBG: No results found for this basename: GLUCAP:5 in the last 168 hours  Coagulation Studies:  Basename 01/29/12 1230  LABPROT 14.5  INR 1.11    Imaging: No results found.      Assessment:   1) Acute on chronic systolic heart failure/low output 2) NICM,  EF 20-25%      - chemo induced cardiomyopathy 3) Cardiogenic shock  4) Diabetes mellitus  5) OSA with CPAP at night 6) Obesity 7) h/o breast cancer   Plan/Discussion:    Ms. Digman will be admitted to the stepdown unit due to decompensated heart failure with low output consistent with cardiogenic shock.  Place PICC line to follow CVPs and co-ox.  Will start milrinone 0.25 mcg/kg/min and IV diuretics.  Prognosis is guarded.  Will continue CPAP use at night for OSA.  Will continue insulin for DM and use sliding scale.     Arvilla Meres, MD 01/30/2012, 2:40 PM

## 2012-01-30 NOTE — Interval H&P Note (Signed)
History and Physical Interval Note:  01/30/2012 12:38 PM  [name]  has presented today for surgery, with the diagnosis of Heart Failure  The various methods of treatment have been discussed with the patient and family. After consideration of risks, benefits and other options for treatment, the patient has consented to  Procedure(s) (LRB): JV RIGHT HEART CATHETERIZATION (N/A) as a surgical intervention .  The patient's history has been reviewed, patient examined, no change in status, stable for surgery.  I have reviewed the patients' chart and labs.  Questions were answered to the patient's satisfaction.     [name]

## 2012-01-31 ENCOUNTER — Encounter (HOSPITAL_COMMUNITY): Payer: Self-pay | Admitting: Family

## 2012-01-31 DIAGNOSIS — R570 Cardiogenic shock: Secondary | ICD-10-CM

## 2012-01-31 DIAGNOSIS — J9601 Acute respiratory failure with hypoxia: Secondary | ICD-10-CM

## 2012-01-31 DIAGNOSIS — J9620 Acute and chronic respiratory failure, unspecified whether with hypoxia or hypercapnia: Secondary | ICD-10-CM

## 2012-01-31 LAB — BASIC METABOLIC PANEL
CO2: 28 mEq/L (ref 19–32)
Chloride: 99 mEq/L (ref 96–112)
Creatinine, Ser: 0.6 mg/dL (ref 0.50–1.10)
Glucose, Bld: 180 mg/dL — ABNORMAL HIGH (ref 70–99)

## 2012-01-31 LAB — GLUCOSE, CAPILLARY: Glucose-Capillary: 311 mg/dL — ABNORMAL HIGH (ref 70–99)

## 2012-01-31 LAB — CARBOXYHEMOGLOBIN: Methemoglobin: 1 % (ref 0.0–1.5)

## 2012-01-31 MED ORDER — RAMIPRIL 5 MG PO CAPS
5.0000 mg | ORAL_CAPSULE | Freq: Every day | ORAL | Status: DC
  Administered 2012-02-01 – 2012-02-02 (×2): 5 mg via ORAL
  Filled 2012-01-31 (×2): qty 1

## 2012-01-31 MED ORDER — SODIUM CHLORIDE 0.9 % IJ SOLN
10.0000 mL | Freq: Two times a day (BID) | INTRAMUSCULAR | Status: DC
  Administered 2012-01-31: 10 mL
  Administered 2012-02-01: 20 mL
  Administered 2012-02-01 – 2012-02-06 (×6): 10 mL

## 2012-01-31 MED ORDER — SODIUM CHLORIDE 0.9 % IJ SOLN
10.0000 mL | INTRAMUSCULAR | Status: DC | PRN
  Administered 2012-02-07 (×2): 10 mL

## 2012-01-31 MED ORDER — MILRINONE IN DEXTROSE 200-5 MCG/ML-% IV SOLN
0.1250 ug/kg/min | INTRAVENOUS | Status: DC
  Administered 2012-01-31 – 2012-02-05 (×10): 0.25 ug/kg/min via INTRAVENOUS
  Administered 2012-02-06: 0.125 ug/kg/min via INTRAVENOUS
  Filled 2012-01-31 (×12): qty 100

## 2012-01-31 NOTE — Progress Notes (Signed)
Peripherally Inserted Central Catheter/Midline Placement  The IV Nurse has discussed with the patient and/or persons authorized to consent for the patient, the purpose of this procedure and the potential benefits and risks involved with this procedure.  The benefits include less needle sticks, lab draws from the catheter and patient may be discharged home with the catheter.  Risks include, but not limited to, infection, bleeding, blood clot (thrombus formation), and puncture of an artery; nerve damage and irregular heat beat.  Alternatives to this procedure were also discussed.  PICC/Midline Placement Documentation        [name] 01/31/2012, 10:21 AM

## 2012-01-31 NOTE — Care Management Note (Addendum)
    Page 1 of 1   02/07/2012     9:41:37 AM   CARE MANAGEMENT NOTE 02/07/2012  Patient:  [name], [name]   Account Number:  [id number]  Date Initiated:  01/31/2012  Documentation initiated by:  Junius Creamer  Subjective/Objective Assessment:   adm w heart failure     Action/Plan:   lives mother and brother, pcp dr Foye Deer   Anticipated DC Date:  02/07/2012   Anticipated DC Plan:  HOME W HOME HEALTH SERVICES  In-house referral  Hospice / Palliative Care      DC Planning Services  CM consult      [facility] Choice  HOME HEALTH   Choice offered to / List presented to:  C-1 Patient        HH arranged  HH-1 RN      [facility] agency  [facility]   Status of service:   Medicare Important Message given?   (If response is "NO", the following Medicare IM given date fields will be blank) Date Medicare IM given:   Date Additional Medicare IM given:    Discharge Disposition:  HOME W HOME HEALTH SERVICES  Per UR Regulation:  Reviewed for med. necessity/level of care/duration of stay  If discussed at Long Length of Stay Meetings, dates discussed:   02/05/2012    Comments:  7/5 9:40a debbie Vanessia Bokhari rn, bsn pt dc home w picc line. have aloerted marie w [facility] dc w picc line for picc line care.going to da's home.  7/2 12:15p debbie Sammye Staff rn,bsn pt to get paliative care meeting and this is sched for thurs 7-4. pt lives in [place]. she gets cpap from [facility] and needs new mask and tubing. have alerted marie w [facility] of this need. md note also say if unable to wean milrinone may need at home. spoke w pt and went over hhc list. she uses [facility] for eq and will use them for hhc. have marie w [facility] on standby if milrinone needed at home.  7/1 13:47p debbie Even Budlong rn,bsn on iv milronone gtt.  01-31-12 10:41a debbie Nixxon Faria rn,bsn [phone] she still drives. inc shortness of breath and harder getting around.

## 2012-01-31 NOTE — Progress Notes (Signed)
Advanced Heart Failure Rounding Note   Subjective:    [name] is a 56 year old female with history of breast cancer, obesity, HTN, diabetes, IBS, and OSA with CPAP use. She had chemo induced cardiomyopathy. Previous evaluation included cardiac MRI in July 2010 with EF 45% with myoview in 2011 showing EF 59% but no ischemia. Most recent echo in Jan 2013 where EF has fallen back to 20-25% with diffuse hypokinesis. Doppler parameters were also consistent with restrictive physiology.   She has been followed closely in the Heart Failure clinic. She was recently evaluated for orthopnea, weight gain and progressive dyspnea on exertion. She was brought to the outpatient cath lab 6/27 for RHC. Cath demonstrated decompensated HF with low cardiac output consistent with cardiogenic shock. She was admitted 01/30/12  for IV diuresis with inotrope support. Admit weight 229 pounds.   RHC 6/27  RA = 20  RV = 55/13/22  PA =56/34 (44)  PCW = 28  Fick cardiac output/index = 3.1/1.5  PVR = 5.3 Woods  FA sat = 91%  PA sat = 42%, 48%  SVC sat = 46%  Weight down 6 pounds . So far she has diuresed 2.1 liters with IV lasix. Milrinone continues 0.25 mcg/kg/min via peripheral IV. Using CPAP at night.   Breathing improved. Denies SOB/PND/CP. + orthopnea.    Objective:   Weight Range:  Vital Signs:   Temp:  [97.2 F (36.2 C)-98.1 F (36.7 C)] 97.2 F (36.2 C) (06/28 0435) Pulse Rate:  [105-117] 109  (06/28 0430) Resp:  [15-28] 22  (06/28 0430) BP: (100-130)/(48-81) 106/63 mmHg (06/28 0430) SpO2:  [90 %-95 %] 94 % (06/28 0430) Weight:  [101.3 kg (223 lb 5.2 oz)-103.9 kg (229 lb 0.9 oz)] 101.3 kg (223 lb 5.2 oz) (06/28 0435) Last BM Date: 01/29/12  Weight change: Filed Weights   01/30/12 1342 01/30/12 1700 01/31/12 0435  Weight: 103.9 kg (229 lb 0.9 oz) 103.9 kg (229 lb 0.9 oz) 101.3 kg (223 lb 5.2 oz)    Intake/Output:   Intake/Output Summary (Last 24 hours) at 01/31/12 0707 Last data filed at  01/31/12 0600  Gross per 24 hour  Intake  361.4 ml  Output   2500 ml  Net -2138.6 ml     Physical Exam: General: Chronically ill appearing. No resp difficulty HEENT: normal Neck: supple. JVP difficult to assess Carotids 2+ bilat; no bruits. No lymphadenopathy or thryomegaly appreciated. Cor: PMI nondisplaced. Regular rate & rhythm. No rubs,  or murmurs. + S3 Lungs: clear Abdomen: soft, nontender, distended. No hepatosplenomegaly. No bruits or masses. Good bowel sounds. Extremities: no cyanosis, clubbing, rash, edema Neuro: alert & orientedx3, cranial nerves grossly intact. moves all 4 extremities w/o difficulty. Affect pleasant  Telemetry: Sinus Tach 112  Labs: Basic Metabolic Panel:  Lab 01/31/12 9811 01/30/12 1816 01/30/12 1313 01/29/12 1230  NA 137 -- -- 134*  K 3.7 -- -- 4.1  CL 99 -- -- 93*  CO2 28 -- -- 31  GLUCOSE 180* -- 163* 269*  BUN 15 -- -- 19  CREATININE 0.60 0.68 -- 0.83  CALCIUM 9.2 -- -- 9.9  MG -- -- -- --  PHOS -- -- -- --    Liver Function Tests: No results found for this basename: AST:5,ALT:5,ALKPHOS:5,BILITOT:5,PROT:5,ALBUMIN:5 in the last 168 hours No results found for this basename: LIPASE:5,AMYLASE:5 in the last 168 hours No results found for this basename: AMMONIA:3 in the last 168 hours  CBC:  Lab 01/30/12 1816 01/29/12 1230  WBC 8.8  9.9  NEUTROABS -- --  HGB 12.5 13.2  HCT 39.5 40.5  MCV 87.8 87.1  PLT 263 281    Cardiac Enzymes: No results found for this basename: CKTOTAL:5,CKMB:5,CKMBINDEX:5,TROPONINI:5 in the last 168 hours  BNP: BNP (last 3 results)  Basename 08/13/11 0509 08/11/11 1541  PROBNP 1025.0* 868.7*     Other results:  EKG:   Imaging:  No results found.   Medications:     Scheduled Medications:    . atorvastatin  40 mg Oral q1800  . busPIRone  30 mg Oral BID  . Chlorhexidine Gluconate Cloth  6 each Topical Q0600  . citalopram  40 mg Oral Daily  . digoxin  0.125 mg Oral Daily  . enoxaparin  40 mg  Subcutaneous Q24H  . furosemide  80 mg Intravenous BID  . insulin aspart  0-15 Units Subcutaneous TID WC  . levothyroxine  75 mcg Oral QAC breakfast  . mupirocin ointment  1 application Nasal BID  . omega-3 acid ethyl esters  2 g Oral BID  . pantoprazole  40 mg Oral BID AC  . potassium chloride  60 mEq Oral BID  . ramipril  2.5 mg Oral Daily  . rOPINIRole  1 mg Oral QHS  . rOPINIRole  1 mg Oral NOW  . sodium chloride  3 mL Intravenous Q12H  . spironolactone  25 mg Oral Daily  . tiotropium  18 mcg Inhalation Daily  . DISCONTD: levothyroxine  75 mcg Oral QAC breakfast  . DISCONTD: pantoprazole  40 mg Oral BID AC  . DISCONTD: potassium chloride  60 mEq Oral BID  . DISCONTD: rOPINIRole  1 mg Oral QHS     Infusions:    . milrinone 0.25 mcg/kg/min (01/31/12 1610)  . DISCONTD: milrinone 0.25 mcg/kg/min (01/30/12 1843)     PRN Medications:  sodium chloride, acetaminophen, ondansetron (ZOFRAN) IV, sodium chloride   Assessment:  1) Acute on chronic systolic heart failure/low output  2) Acute on chronic respiratory failure with sats in 70-80s during cath - improved 3) NICM, EF 20-25%  - chemo induced cardiomyopathy  4) Cardiogenic shock  5) Diabetes mellitus  6) OSA with CPAP at night  7) Obesity  8) h/o breast cancer  9) Deconditioning 10) hypokalemia  Plan/Discussion:    Continue Milrinone 0.25 mcg /kg/min  for low output. Continue to diurese. Hold beta blocker due acute decompensation. PICC to be placed today. Will obtain CO-OX and CVP after PICC placed. Renal function stable.  Continue CPAP  PT consult for deconditioning.  Length of Stay: 1 [name],AMY NP-C 01/31/2012, 7:07 AM   Patient seen and examined with Tonye Becket, NP. We discussed all aspects of the encounter. I agree with the assessment and plan as stated above.   Feels better on IV lasix and milrinone but remains tachycardic and tenuous. Will continue diuresis. Keep milrinone on over weekend (keep in  stepdown). Will focus on aggressive titration of ramipril over the weekend 2.5 -> 5 -> 7.5 -> 10mg  daily. Would hold off on b-blocker for now. PICC being placed this am to follow CVPs and co-ox.   I remained very concerned about her overall prognosis. She is not candidate for advanced therapies due to lack of social support. Check thyroid panel.  Joanne Fountaine,MD 9:16 AM

## 2012-01-31 NOTE — Progress Notes (Signed)
CARDIAC REHAB PHASE I   PRE:  Rate/Rhythm: 117 ST  BP:  Supine:   Sitting: 103/71  Standing:    SaO2: 91 RA  MODE:  Ambulation: 390 ft   POST:  Rate/Rhythem: 127 ST  BP:     Sitting: 116/53   SaO2: 96 RA  1318-1425 Assisted X 1 to ambulate. Gait steady. VS stable. Pt able to walk 390 feet without c/o. States her SOB is much better than when she came in. She is dyspneic on exertion but RA sat after walk 96%. Pt to recliner after walk with call light in reach.

## 2012-01-31 NOTE — Progress Notes (Signed)
Inpatient Diabetes Program Recommendations  AACE/ADA: New Consensus Statement on Inpatient Glycemic Control (2009)  Target Ranges:  Prepandial:   less than 140 mg/dL      Peak postprandial:   less than 180 mg/dL (1-2 hours)      Critically ill patients:  140 - 180 mg/dL   Reason for Visit: CBGs 01/31/12  171-310 mg/dl  Inpatient Diabetes Program Recommendations Insulin - Basal: Start Lantus 20 units daily and titrate if CBGs continue greater than 180 mg/dl.  Note: Patient takes 75/25 insulin 95 units twice a day at home.  If patient not eating well, Lantus would be the choice of insulin at this time.  Start with Lantus 20 units and titrate as needed.  Could restart premixed insulin before discharge.

## 2012-01-31 NOTE — Progress Notes (Signed)
Pt placed on CPAP with a 4L bleed in at 2110. Tolerating well. Instructed to let nurse know to contact me if she needs anything adjusted.

## 2012-02-01 DIAGNOSIS — R570 Cardiogenic shock: Secondary | ICD-10-CM

## 2012-02-01 DIAGNOSIS — I5022 Chronic systolic (congestive) heart failure: Secondary | ICD-10-CM

## 2012-02-01 LAB — BASIC METABOLIC PANEL
Chloride: 97 mEq/L (ref 96–112)
Creatinine, Ser: 0.56 mg/dL (ref 0.50–1.10)
GFR calc Af Amer: >90 mL/min (ref >90)
Potassium: 4.5 mEq/L (ref 3.5–5.1)
Sodium: 137 mEq/L (ref 135–145)

## 2012-02-01 LAB — CARBOXYHEMOGLOBIN
Carboxyhemoglobin: 1.6 % — ABNORMAL HIGH (ref 0.5–1.5)
Methemoglobin: 1.2 % (ref 0.0–1.5)
O2 Saturation: 56.6 %
Total hemoglobin: 13.9 g/dL (ref 12.5–16.0)

## 2012-02-01 MED ORDER — INSULIN GLARGINE 100 UNIT/ML ~~LOC~~ SOLN
20.0000 [IU] | Freq: Every day | SUBCUTANEOUS | Status: DC
  Administered 2012-02-01 – 2012-02-02 (×2): 20 [IU] via SUBCUTANEOUS

## 2012-02-01 NOTE — Progress Notes (Signed)
CARDIAC REHAB PHASE I   PRE:  Rate/Rhythm: 114 ST  BP:  Supine: 87/36  Sitting:   Standing:    SaO2: 97 RA  MODE:  Ambulation: 740 ft   POST:  Rate/Rhythem: 130 ST  BP:  Supine:   Sitting: 118/61  Standing:    SaO2: 92 RA 1350-1430 Assisted X 1 to ambulate. Gait steady. VS stable Pt able to walk 740 feet, states that it has months since she has been able to do that.Pt to recliner after walk with call light in reach. Discussed CHF with pt, she has good understanding of it. Pt keeps weight, BP and blood sugar charts.Staes that she has CHF education materials at home.   Beatrix Fetters

## 2012-02-01 NOTE — Progress Notes (Signed)
Advanced Heart Failure Rounding Note   Subjective:    [name] is a 56 year old female with history of breast cancer, obesity, HTN, diabetes, IBS, and OSA with CPAP use. She had chemo induced cardiomyopathy. Previous evaluation included cardiac MRI in July 2010 with EF 45% with myoview in 2011 showing EF 59% but no ischemia. Most recent echo in Jan 2013 where EF has fallen back to 20-25% with diffuse hypokinesis. Doppler parameters were also consistent with restrictive physiology.   She has been followed closely in the Heart Failure clinic. She was recently evaluated for orthopnea, weight gain and progressive dyspnea on exertion. She was brought to the outpatient cath lab 6/27 for RHC. Cath demonstrated decompensated HF with low cardiac output consistent with cardiogenic shock. She was admitted 01/30/12  for IV diuresis with inotrope support. Admit weight 229 pounds.   RHC 6/27  RA = 20  RV = 55/13/22  PA =56/34 (44)  PCW = 28  Fick cardiac output/index = 3.1/1.5  PVR = 5.3 Woods  FA sat = 91%  PA sat = 42%, 48%  SVC sat = 46%  Weight down 6 pounds . So far she has diuresed 2.1 liters with IV lasix. Milrinone continues 0.25 mcg/kg/min via peripheral IV. Using CPAP at night.   Breathing improved. Denies SOB/PND/CP. + orthopnea.   She has diuresed briskly over the past 2 days. Co-ox  =56.   Objective:   Weight Range:  Vital Signs:   Temp:  [97.4 F (36.3 C)-98.6 F (37 C)] 98.4 F (36.9 C) (06/29 1129) Pulse Rate:  [119] 119  (06/28 1645) Resp:  [14-24] 14  (06/29 0903) BP: (101-120)/(60-69) 111/69 mmHg (06/29 0315) SpO2:  [92 %-95 %] 95 % (06/29 0903) Weight:  [220 lb 14.4 oz (100.2 kg)] 220 lb 14.4 oz (100.2 kg) (06/29 0500) Last BM Date: 01/30/12  Weight change: Filed Weights   01/30/12 1700 01/31/12 0435 02/01/12 0500  Weight: 229 lb 0.9 oz (103.9 kg) 223 lb 5.2 oz (101.3 kg) 220 lb 14.4 oz (100.2 kg)    Intake/Output:   Intake/Output Summary (Last 24 hours) at  02/01/12 1142 Last data filed at 02/01/12 0900  Gross per 24 hour  Intake 1567.2 ml  Output   3650 ml  Net -2082.8 ml     Physical Exam: General: Chronically ill appearing. No resp difficulty HEENT: normal Neck: supple. JVP difficult to assess Carotids 2+ bilat; no bruits. No lymphadenopathy or thryomegaly appreciated. Cor: PMI nondisplaced. Regular rate & rhythm. No rubs,  or murmurs. + S3 Lungs: clear Abdomen: soft, nontender, distended. No hepatosplenomegaly. No bruits or masses. Good bowel sounds. Extremities: no cyanosis, clubbing, rash, edema Neuro: alert & orientedx3, cranial nerves grossly intact. moves all 4 extremities w/o difficulty. Affect pleasant  Telemetry: Sinus Tach 112  Labs: Basic Metabolic Panel:  Lab 02/01/12 6962 01/31/12 0550 01/30/12 1816 01/30/12 1313 01/29/12 1230  NA 137 137 -- -- 134*  K 4.5 3.7 -- -- 4.1  CL 97 99 -- -- 93*  CO2 29 28 -- -- 31  GLUCOSE 261* 180* -- 163* 269*  BUN 15 15 -- -- 19  CREATININE 0.56 0.60 0.68 -- 0.83  CALCIUM 9.1 9.2 -- -- 9.9  MG -- -- -- -- --  PHOS -- -- -- -- --    Liver Function Tests: No results found for this basename: AST:5,ALT:5,ALKPHOS:5,BILITOT:5,PROT:5,ALBUMIN:5 in the last 168 hours No results found for this basename: LIPASE:5,AMYLASE:5 in the last 168 hours No results found for  this basename: AMMONIA:3 in the last 168 hours  CBC:  Lab 01/30/12 1816 01/29/12 1230  WBC 8.8 9.9  NEUTROABS -- --  HGB 12.5 13.2  HCT 39.5 40.5  MCV 87.8 87.1  PLT 263 281    Cardiac Enzymes: No results found for this basename: CKTOTAL:5,CKMB:5,CKMBINDEX:5,TROPONINI:5 in the last 168 hours  BNP: BNP (last 3 results)  Basename 08/13/11 0509 08/11/11 1541  PROBNP 1025.0* 868.7*     Other results:  EKG:   Imaging: No results found.   Medications:     Scheduled Medications:    . atorvastatin  40 mg Oral q1800  . busPIRone  30 mg Oral BID  . Chlorhexidine Gluconate Cloth  6 each Topical Q0600  .  citalopram  40 mg Oral Daily  . digoxin  0.125 mg Oral Daily  . enoxaparin  40 mg Subcutaneous Q24H  . furosemide  80 mg Intravenous BID  . insulin aspart  0-15 Units Subcutaneous TID WC  . levothyroxine  75 mcg Oral QAC breakfast  . mupirocin ointment  1 application Nasal BID  . omega-3 acid ethyl esters  2 g Oral BID  . pantoprazole  40 mg Oral BID AC  . potassium chloride  60 mEq Oral BID  . ramipril  5 mg Oral Daily  . rOPINIRole  1 mg Oral QHS  . sodium chloride  10-40 mL Intracatheter Q12H  . sodium chloride  3 mL Intravenous Q12H  . spironolactone  25 mg Oral Daily  . tiotropium  18 mcg Inhalation Daily  . DISCONTD: ramipril  2.5 mg Oral Daily    Infusions:    . 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 She is diuresing well.. Hold beta blocker due acute decompensation. PICC line placed yesterday.  Will obtain CO-OX and CVP after PICC placed. Renal function stable.  Continue CPAP  PT consult for deconditioning.  Vesta Mixer, Montez Hageman., MD, [facility] 02/01/2012, 11:50 AM Office - [phone] Pager [phone]

## 2012-02-02 DIAGNOSIS — I5023 Acute on chronic systolic (congestive) heart failure: Principal | ICD-10-CM

## 2012-02-02 DIAGNOSIS — I10 Essential (primary) hypertension: Secondary | ICD-10-CM

## 2012-02-02 LAB — BASIC METABOLIC PANEL
CO2: 29 mEq/L (ref 19–32)
Calcium: 9.5 mg/dL (ref 8.4–10.5)
Glucose, Bld: 263 mg/dL — ABNORMAL HIGH (ref 70–99)
Sodium: 135 mEq/L (ref 135–145)

## 2012-02-02 LAB — GLUCOSE, CAPILLARY
Glucose-Capillary: 265 mg/dL — ABNORMAL HIGH (ref 70–99)
Glucose-Capillary: 286 mg/dL — ABNORMAL HIGH (ref 70–99)

## 2012-02-02 LAB — CARBOXYHEMOGLOBIN: O2 Saturation: 73.9 %

## 2012-02-02 MED ORDER — RAMIPRIL 5 MG PO CAPS
7.5000 mg | ORAL_CAPSULE | Freq: Every day | ORAL | Status: DC
  Administered 2012-02-03 – 2012-02-05 (×3): 7.5 mg via ORAL
  Filled 2012-02-02 (×3): qty 1

## 2012-02-02 MED ORDER — TRAZODONE HCL 100 MG PO TABS
100.0000 mg | ORAL_TABLET | Freq: Two times a day (BID) | ORAL | Status: DC | PRN
  Filled 2012-02-02: qty 1

## 2012-02-02 MED ORDER — CARVEDILOL 3.125 MG PO TABS
3.1250 mg | ORAL_TABLET | Freq: Two times a day (BID) | ORAL | Status: DC
  Administered 2012-02-02: 3.125 mg via ORAL
  Filled 2012-02-02 (×4): qty 1

## 2012-02-02 NOTE — Progress Notes (Signed)
Advanced Heart Failure Rounding Note   Subjective:    [name] is a 56 year old female with history of breast cancer, obesity, HTN, diabetes, IBS, and OSA with CPAP use. She had chemo induced cardiomyopathy. Previous evaluation included cardiac MRI in July 2010 with EF 45% with myoview in 2011 showing EF 59% but no ischemia. Most recent echo in Jan 2013 where EF has fallen back to 20-25% with diffuse hypokinesis. Doppler parameters were also consistent with restrictive physiology.   She has been followed closely in the Heart Failure clinic. She was recently evaluated for orthopnea, weight gain and progressive dyspnea on exertion. She was brought to the outpatient cath lab 6/27 for RHC. Cath demonstrated decompensated HF with low cardiac output consistent with cardiogenic shock. She was admitted 01/30/12  for IV diuresis with inotrope support. Admit weight 229 pounds. Wt. As of 6/30 = 220 lbs.  RHC 6/27  RA = 20  RV = 55/13/22  PA =56/34 (44)  PCW = 28  Fick cardiac output/index = 3.1/1.5  PVR = 5.3 Woods  FA sat = 91%  PA sat = 42%, 48%  SVC sat = 46%  Weight down 6 pounds . So far she has diuresed 2.1 liters with IV lasix. Milrinone continues 0.25 mcg/kg/min via peripheral IV. Using CPAP at night.   Breathing improved. Denies SOB/PND/CP. + orthopnea.   She has diuresed briskly over the past 2 days.  Co-ox  =56 on 6/29 Co-ox = 74 6/30    Objective:   Weight Range:  Vital Signs:   Temp:  [97.7 F (36.5 C)-98.7 F (37.1 C)] 98 F (36.7 C) (06/30 0721) Pulse Rate:  [116-119] 119  (06/30 0436) Resp:  [18] 18  (06/30 0436) BP: (119-123)/(67-83) 119/68 mmHg (06/30 0721) SpO2:  [93 %-98 %] 94 % (06/30 0900) Weight:  [220 lb 10.9 oz (100.1 kg)] 220 lb 10.9 oz (100.1 kg) (06/30 0600) Last BM Date: 02/01/12  Weight change: Filed Weights   01/31/12 0435 02/01/12 0500 02/02/12 0600  Weight: 223 lb 5.2 oz (101.3 kg) 220 lb 14.4 oz (100.2 kg) 220 lb 10.9 oz (100.1 kg)     Intake/Output:   Intake/Output Summary (Last 24 hours) at 02/02/12 1004 Last data filed at 02/02/12 1000  Gross per 24 hour  Intake 1382.4 ml  Output   2750 ml  Net -1367.6 ml     Physical Exam: General: Chronically ill appearing. No resp difficulty HEENT: normal Neck: supple. JVP difficult to assess Carotids 2+ bilat; no bruits. No lymphadenopathy or thryomegaly appreciated. Cor: PMI nondisplaced. Regular rate & rhythm. No rubs,  or murmurs. + S3, tachy  Lungs: clear Abdomen: soft, nontender, distended. No hepatosplenomegaly. No bruits or masses. Good bowel sounds. Extremities: no cyanosis, clubbing, rash, edema Neuro: alert & orientedx3, cranial nerves grossly intact. moves all 4 extremities w/o difficulty. Affect pleasant  Telemetry: 02/02/2012  Sinus Tach 112  Labs: Basic Metabolic Panel:  Lab 02/02/12 1610 02/01/12 0500 01/31/12 0550 01/30/12 1816 01/30/12 1313 01/29/12 1230  NA 135 137 137 -- -- 134*  K 4.3 4.5 3.7 -- -- 4.1  CL 97 97 99 -- -- 93*  CO2 29 29 28  -- -- 31  GLUCOSE 263* 261* 180* -- 163* 269*  BUN 14 15 15  -- -- 19  CREATININE 0.58 0.56 0.60 0.68 -- 0.83  CALCIUM 9.5 9.1 9.2 -- -- --  MG -- -- -- -- -- --  PHOS -- -- -- -- -- --    Liver  Function Tests: No results found for this basename: AST:5,ALT:5,ALKPHOS:5,BILITOT:5,PROT:5,ALBUMIN:5 in the last 168 hours No results found for this basename: LIPASE:5,AMYLASE:5 in the last 168 hours No results found for this basename: AMMONIA:3 in the last 168 hours  CBC:  Lab 01/30/12 1816 01/29/12 1230  WBC 8.8 9.9  NEUTROABS -- --  HGB 12.5 13.2  HCT 39.5 40.5  MCV 87.8 87.1  PLT 263 281    Cardiac Enzymes: No results found for this basename: CKTOTAL:5,CKMB:5,CKMBINDEX:5,TROPONINI:5 in the last 168 hours  BNP: BNP (last 3 results)  Basename 08/13/11 0509 08/11/11 1541  PROBNP 1025.0* 868.7*     Other results:  EKG:   Imaging: No results found.   Medications:     Scheduled  Medications:    . atorvastatin  40 mg Oral q1800  . busPIRone  30 mg Oral BID  . Chlorhexidine Gluconate Cloth  6 each Topical Q0600  . citalopram  40 mg Oral Daily  . digoxin  0.125 mg Oral Daily  . enoxaparin  40 mg Subcutaneous Q24H  . furosemide  80 mg Intravenous BID  . insulin aspart  0-15 Units Subcutaneous TID WC  . insulin glargine  20 Units Subcutaneous QHS  . levothyroxine  75 mcg Oral QAC breakfast  . mupirocin ointment  1 application Nasal BID  . omega-3 acid ethyl esters  2 g Oral BID  . pantoprazole  40 mg Oral BID AC  . potassium chloride  60 mEq Oral BID  . ramipril  5 mg Oral Daily  . rOPINIRole  1 mg Oral QHS  . sodium chloride  10-40 mL Intracatheter Q12H  . sodium chloride  3 mL Intravenous Q12H  . spironolactone  25 mg Oral Daily  . tiotropium  18 mcg Inhalation Daily    Infusions:    . milrinone 0.25 mcg/kg/min (02/01/12 2145)    PRN Medications: sodium chloride, acetaminophen, ondansetron (ZOFRAN) IV, sodium chloride, sodium chloride   Assessment:  1) Acute on chronic systolic heart failure/low output  2) Acute on chronic respiratory failure with sats in 70-80s during cath - improved 3) NICM, EF 20-25%  - chemo induced cardiomyopathy  4) Cardiogenic shock  5) Diabetes mellitus  6) OSA with CPAP at night  7) Obesity  8) h/o breast cancer  9) Deconditioning 10) hypokalemia  Plan/Discussion:    Continue Milrinone 0.25 mcg /kg/min  for low output. She is diuresing well.. Hold beta blocker due acute decompensation. PICC line placed.  Her CO-OX seems to be improving.  She still has an S3.  She is tachycardic - I think we can start low dose Coreg today.  Continue CPAP  Her glucose levels are elevated.  Will change to carb modified diet.  Restarted Trazodone 100 BID ( home medication)   PT consult for deconditioning.  Vesta Mixer, Montez Hageman., MD, [facility] 02/02/2012, 10:04 AM Office - [phone] Pager [phone]

## 2012-02-03 LAB — GLUCOSE, CAPILLARY: Glucose-Capillary: 260 mg/dL — ABNORMAL HIGH (ref 70–99)

## 2012-02-03 LAB — CARBOXYHEMOGLOBIN
Carboxyhemoglobin: 2.2 % — ABNORMAL HIGH (ref 0.5–1.5)
O2 Saturation: 83.3 %

## 2012-02-03 LAB — BASIC METABOLIC PANEL
Chloride: 93 mEq/L — ABNORMAL LOW (ref 96–112)
GFR calc Af Amer: >90 mL/min (ref >90)
GFR calc non Af Amer: >90 mL/min (ref >90)
Potassium: 4.3 mEq/L (ref 3.5–5.1)
Sodium: 133 mEq/L — ABNORMAL LOW (ref 135–145)

## 2012-02-03 MED ORDER — INSULIN GLARGINE 100 UNIT/ML ~~LOC~~ SOLN
23.0000 [IU] | Freq: Every day | SUBCUTANEOUS | Status: DC
  Administered 2012-02-03: 23 [IU] via SUBCUTANEOUS

## 2012-02-03 NOTE — Progress Notes (Signed)
Inpatient Diabetes Program Recommendations  AACE/ADA: New Consensus Statement on Inpatient Glycemic Control (2009)  Target Ranges:  Prepandial:   less than 140 mg/dL      Peak postprandial:   less than 180 mg/dL (1-2 hours)      Critically ill patients:  140 - 180 mg/dL   Results for DIVINA, NEALE (MRN [id number]) as of 02/03/2012 13:34  Ref. Range 02/02/2012 11:40 02/02/2012 17:02 02/02/2012 21:28 02/03/2012 08:17 02/03/2012 12:28  Glucose-Capillary Latest Range: 70-99 mg/dL 409 (H) 811 (H) 914 (H) 260 (H) 309 (H)    Inpatient Diabetes Program Recommendations Insulin - Basal: Increase lantus to 30 units  Patient takes significantly more insulin at home.  Will follow.  Thank you  Piedad Climes [facility] Inpatient Diabetes Coordinator [phone]

## 2012-02-03 NOTE — Progress Notes (Signed)
CARDIAC REHAB PHASE I   PRE:  Rate/Rhythm: 121 ST  BP:  Supine:   Sitting: 113/68  Standing:    SaO2: 93 RA  MODE:  Ambulation: 1050 ft   POST:  Rate/Rhythem: 134 ST  BP:  Supine:   Sitting: 122/69  Standing:    SaO2: 97 RA 1110-1145 Tolerated ambulation well without c/o of cp or SOB. VS stable other than HR 134 after walk. Pt to side of bed after walk with call light in reach.  Beatrix Fetters

## 2012-02-03 NOTE — Progress Notes (Signed)
Advanced Heart Failure Rounding Note   Subjective:    [name] is a 56 year old female with history of breast cancer, obesity, HTN, diabetes, IBS, and OSA with CPAP use. She had chemo induced cardiomyopathy. Previous evaluation included cardiac MRI in July 2010 with EF 45% with myoview in 2011 showing EF 59% but no ischemia. Most recent echo in Jan 2013 where EF has fallen back to 20-25% with diffuse hypokinesis. Doppler parameters were also consistent with restrictive physiology.   She has been followed closely in the Heart Failure clinic. She was recently evaluated for orthopnea, weight gain and progressive dyspnea on exertion. She was brought to the outpatient cath lab 6/27 for RHC. Cath demonstrated decompensated HF with low cardiac output consistent with cardiogenic shock. She was admitted 01/30/12  for IV diuresis with inotrope support. Admit weight 229 pounds. Wt.  RHC 6/27  RA = 20  RV = 55/13/22  PA =56/34 (44)  PCW = 28  Fick cardiac output/index = 3.1/1.5  PVR = 5.3 Woods  FA sat = 91%  PA sat = 42%, 48%  SVC sat = 46%  Last night while sleeping she was hypoxic with sats noted in the 30s. Respiratory adjusted C-PAP and saturations increased >92%. Ambulated with cardiac rehab.  Weight down 12 pound .  Milrinone continues 0.25 mcg/kg/min via PICC. Yesterday ace titrated up and beta blocker added.  SBP soft. HR> 120s  Feels better. Remains SOB with exertion. Denies orthopnea/PND/CP.    Co-ox  56>74>83. Calculated SVR using co-ox is about 600.      Objective:   Weight Range:  Vital Signs:   Temp:  [97.7 F (36.5 C)-98.1 F (36.7 C)] 98.1 F (36.7 C) (07/01 0421) Pulse Rate:  [115-119] 119  (07/01 0700) Resp:  [18-19] 19  (07/01 0421) BP: (86-115)/(53-78) 111/66 mmHg (07/01 0700) SpO2:  [92 %-96 %] 92 % (07/01 0700) Weight:  [216 lb 7.9 oz (98.2 kg)] 216 lb 7.9 oz (98.2 kg) (07/01 0700) Last BM Date: 02/01/12  Weight change: Filed Weights   01/31/12 0435  02/01/12 0500 02/02/12 0600  Weight: 223 lb 5.2 oz (101.3 kg) 220 lb 14.4 oz (100.2 kg) 220 lb 10.9 oz (100.1 kg)    Intake/Output:   Intake/Output Summary (Last 24 hours) at 02/03/12 0727 Last data filed at 02/03/12 0500  Gross per 24 hour  Intake 1331.2 ml  Output   3500 ml  Net -2168.8 ml    CVP 14 Physical Exam: General: Chronically ill appearing. No resp difficulty HEENT: normal Neck: supple. JVP difficult to assess Carotids 2+ bilat; no bruits. No lymphadenopathy or thryomegaly appreciated. Cor: PMI nondisplaced. Regular rate & rhythm. No rubs,  or murmurs. + S3, tachy  Lungs: clear Abdomen: soft, nontender, distended. No hepatosplenomegaly. No bruits or masses. Good bowel sounds. Extremities: no cyanosis, clubbing, rash, edema Neuro: alert & orientedx3, cranial nerves grossly intact. moves all 4 extremities w/o difficulty. Affect pleasant  Telemetry: 02/03/2012  Sinus Tach 120s  Labs: Basic Metabolic Panel:  Lab 02/03/12 1610 02/02/12 0350 02/01/12 0500 01/31/12 0550 01/30/12 1816 01/30/12 1313 01/29/12 1230  NA 133* 135 137 137 -- -- 134*  K 4.3 4.3 4.5 3.7 -- -- 4.1  CL 93* 97 97 99 -- -- 93*  CO2 28 29 29 28  -- -- 31  GLUCOSE 238* 263* 261* 180* -- 163* --  BUN 16 14 15 15  -- -- 19  CREATININE 0.62 0.58 0.56 0.60 0.68 -- --  CALCIUM 9.8 9.5 9.1 -- -- -- --  MG -- -- -- -- -- -- --  PHOS -- -- -- -- -- -- --    Liver Function Tests: No results found for this basename: AST:5,ALT:5,ALKPHOS:5,BILITOT:5,PROT:5,ALBUMIN:5 in the last 168 hours No results found for this basename: LIPASE:5,AMYLASE:5 in the last 168 hours No results found for this basename: AMMONIA:3 in the last 168 hours  CBC:  Lab 01/30/12 1816 01/29/12 1230  WBC 8.8 9.9  NEUTROABS -- --  HGB 12.5 13.2  HCT 39.5 40.5  MCV 87.8 87.1  PLT 263 281    Cardiac Enzymes: No results found for this basename: CKTOTAL:5,CKMB:5,CKMBINDEX:5,TROPONINI:5 in the last 168 hours  BNP: BNP (last 3  results)  Basename 08/13/11 0509 08/11/11 1541  PROBNP 1025.0* 868.7*     Other results:   Imaging: No results found.   Medications:     Scheduled Medications:    . atorvastatin  40 mg Oral q1800  . busPIRone  30 mg Oral BID  . carvedilol  3.125 mg Oral BID WC  . Chlorhexidine Gluconate Cloth  6 each Topical Q0600  . citalopram  40 mg Oral Daily  . digoxin  0.125 mg Oral Daily  . enoxaparin  40 mg Subcutaneous Q24H  . furosemide  80 mg Intravenous BID  . insulin aspart  0-15 Units Subcutaneous TID WC  . insulin glargine  20 Units Subcutaneous QHS  . levothyroxine  75 mcg Oral QAC breakfast  . mupirocin ointment  1 application Nasal BID  . omega-3 acid ethyl esters  2 g Oral BID  . pantoprazole  40 mg Oral BID AC  . potassium chloride  60 mEq Oral BID  . ramipril  7.5 mg Oral Daily  . rOPINIRole  1 mg Oral QHS  . sodium chloride  10-40 mL Intracatheter Q12H  . sodium chloride  3 mL Intravenous Q12H  . spironolactone  25 mg Oral Daily  . tiotropium  18 mcg Inhalation Daily  . DISCONTD: ramipril  5 mg Oral Daily    Infusions:    . milrinone 0.25 mcg/kg/min (02/03/12 0500)    PRN Medications: sodium chloride, acetaminophen, ondansetron (ZOFRAN) IV, sodium chloride, sodium chloride, traZODone   Assessment:  1) Acute on chronic systolic heart failure/low output  2) Acute on chronic respiratory failure with sats in 70-80s during cath - improved 3) NICM, EF 20-25%  - chemo induced cardiomyopathy  4) Cardiogenic shock  5) Diabetes mellitus  6) OSA with CPAP at night  7) Obesity  8) h/o breast cancer  9) Deconditioning 10) hypokalemia  Plan/Discussion:    Continues to diurese but CVP remains 14. Continue IV lasix. Continue Milrinone 0.25 mcg /kg/min  for low output. Remains tachycardic.  Will not titrate ace inhibitor due soft blood pressure. SBP <90. Stop  beta blocker due acute decompensation.    Continue CPAP. Watch saturations.   Her glucose levels  are elevated.  Adjust Lantus. Will change to carb modified diet.  PT consult for deconditioning.  CLEGG,AMY NP-C  Patient seen and examined with Tonye Becket, NP. We discussed all aspects of the encounter. I agree with the assessment and plan as stated above. She is markedly improved symptomatically but overall I feel she remains very tenuous. BP was soft this am but now better. SVR low. Will continue current regimen and continue to push diuresis. If co-ox remains up will try to add low-dose b-blocker in am. Will attempt to start milrinone wean midweek..  I had long talk with her that I am concerned that she  may be inotrope dependent but we will have to wait and see how she does with wean. We also discussed code status and she is leaning toward DNR/DNI but wants to discuss with her mother first. Remains FULL CODE for now.   Joanne Goetzinger,MD 5:09 PM

## 2012-02-03 NOTE — Progress Notes (Signed)
Patients 02 saturation <40 while asleep on C- Pap. RT notified, 02 and C-Pap adjusted saturation >94. We will continue to monitor.

## 2012-02-04 DIAGNOSIS — E119 Type 2 diabetes mellitus without complications: Secondary | ICD-10-CM

## 2012-02-04 LAB — GLUCOSE, CAPILLARY
Glucose-Capillary: 327 mg/dL — ABNORMAL HIGH (ref 70–99)
Glucose-Capillary: 368 mg/dL — ABNORMAL HIGH (ref 70–99)

## 2012-02-04 LAB — BASIC METABOLIC PANEL
BUN: 19 mg/dL (ref 6–23)
CO2: 27 mEq/L (ref 19–32)
Chloride: 96 mEq/L (ref 96–112)
GFR calc Af Amer: >90 mL/min (ref >90)
Glucose, Bld: 288 mg/dL — ABNORMAL HIGH (ref 70–99)
Potassium: 4 mEq/L (ref 3.5–5.1)

## 2012-02-04 LAB — CARBOXYHEMOGLOBIN
Carboxyhemoglobin: 1.9 % — ABNORMAL HIGH (ref 0.5–1.5)
O2 Saturation: 59.8 %

## 2012-02-04 MED ORDER — CARVEDILOL 3.125 MG PO TABS
3.1250 mg | ORAL_TABLET | Freq: Two times a day (BID) | ORAL | Status: DC
  Administered 2012-02-04 – 2012-02-05 (×3): 3.125 mg via ORAL
  Filled 2012-02-04 (×5): qty 1

## 2012-02-04 MED ORDER — FUROSEMIDE 40 MG PO TABS
40.0000 mg | ORAL_TABLET | Freq: Two times a day (BID) | ORAL | Status: DC
  Administered 2012-02-04 – 2012-02-06 (×5): 40 mg via ORAL
  Filled 2012-02-04 (×8): qty 1

## 2012-02-04 MED ORDER — INSULIN GLARGINE 100 UNIT/ML ~~LOC~~ SOLN
30.0000 [IU] | Freq: Every day | SUBCUTANEOUS | Status: DC
  Administered 2012-02-04: 30 [IU] via SUBCUTANEOUS

## 2012-02-04 NOTE — Progress Notes (Signed)
Palliative Medicine Team received consult for goals of care requested by Geraldo Pitter PA/Dr Bensimhon.  Chart reviewed, pt admitted 6/27 with cardiogenic shock, volume overload; PMH: breast cancer, obesity, HTN, diabetes, IBS, and OSA with CPAP use; chemo induced cardiomyopathy followed by HF team-  EF=20-25%;  tachycardiac; currently on Milrinone.   Spoke with patient at bedside who requests her daughter and mother be involved in discussion; contacted daughter, [name] at wk# [phone] -due to work schedule [name] requests meeting for Thursday 02/06/12 @ 1:00 pm she will inform and bring her grandmother-[name], RN  01/23/2012, 5:28 PM  Palliative Medicine Team RN Liaison  [phone]

## 2012-02-04 NOTE — Progress Notes (Signed)
Advanced Heart Failure Rounding Note   Subjective:    [name] is a 56 year old female with history of breast cancer, obesity, HTN, diabetes, IBS, and OSA with CPAP use. She had chemo induced cardiomyopathy. Previous evaluation included cardiac MRI in July 2010 with EF 45% with myoview in 2011 showing EF 59% but no ischemia. Most recent echo in Jan 2013 where EF has fallen back to 20-25% with diffuse hypokinesis. Doppler parameters were also consistent with restrictive physiology.   She has been followed closely in the Heart Failure clinic. She was recently evaluated for orthopnea, weight gain and progressive dyspnea on exertion. She was brought to the outpatient cath lab 6/27 for RHC. Cath demonstrated decompensated HF with low cardiac output consistent with cardiogenic shock. She was admitted 01/30/12  for IV diuresis with inotrope support. Admit weight 229 pounds.   RHC 6/27  RA = 20  RV = 55/13/22  PA =56/34 (44)  PCW = 28  Fick cardiac output/index = 3.1/1.5  PVR = 5.3 Woods  FA sat = 91%  PA sat = 42%, 48%  SVC sat = 46%   Weight down 12 pounds since admit.  Overall, -9.8 liters since admit.  Milrinone continues 0.25 mcg/kg/min via PICC.  SBP improved  > 100.  HR remains > 110-120s.  Feels much better.  Remains SOB with exertion. Denies  orthopnea/PND/CP.    Co-ox  [id number].       Objective:   Weight Range:  Vital Signs:   Temp:  [97.2 F (36.2 C)-97.8 F (36.6 C)] 97.5 F (36.4 C) (07/02 0350) Pulse Rate:  [115-130] 125  (07/02 0555) BP: (115-137)/(61-73) 137/73 mmHg (07/02 0555) SpO2:  [91 %-96 %] 95 % (07/02 0555) Weight:  [98 kg (216 lb 0.8 oz)] 98 kg (216 lb 0.8 oz) (07/02 0400) Last BM Date: 02/01/12  Weight change: Filed Weights   02/02/12 0600 02/03/12 0700 02/04/12 0400  Weight: 100.1 kg (220 lb 10.9 oz) 98.2 kg (216 lb 7.9 oz) 98 kg (216 lb 0.8 oz)    Intake/Output:   Intake/Output Summary (Last 24 hours) at 02/04/12 0711 Last data filed at  02/04/12 0700  Gross per 24 hour  Intake 2047.4 ml  Output   3900 ml  Net -1852.6 ml    CVP  Physical Exam: General: Chronically ill appearing. No resp difficulty HEENT: normal Neck: supple. JVP difficult to assess Carotids 2+ bilat; no bruits. No lymphadenopathy or thryomegaly appreciated. Cor: PMI nondisplaced. Regular rate & rhythm. No rubs,  or murmurs. + S3, tachy  Lungs: clear Abdomen: soft, nontender, distended. No hepatosplenomegaly. No bruits or masses. Good bowel sounds. Extremities: no cyanosis, clubbing, rash, edema Neuro: alert & orientedx3, cranial nerves grossly intact. moves all 4 extremities w/o difficulty. Affect pleasant  Telemetry: 02/04/2012  Sinus Tach 120s  Labs: Basic Metabolic Panel:  Lab 02/03/12 4540 02/02/12 0350 02/01/12 0500 01/31/12 0550 01/30/12 1816 01/30/12 1313 01/29/12 1230  NA 133* 135 137 137 -- -- 134*  K 4.3 4.3 4.5 3.7 -- -- 4.1  CL 93* 97 97 99 -- -- 93*  CO2 28 29 29 28  -- -- 31  GLUCOSE 238* 263* 261* 180* -- 163* --  BUN 16 14 15 15  -- -- 19  CREATININE 0.62 0.58 0.56 0.60 0.68 -- --  CALCIUM 9.8 9.5 9.1 -- -- -- --  MG -- -- -- -- -- -- --  PHOS -- -- -- -- -- -- --    Liver Function Tests: No  results found for this basename: AST:5,ALT:5,ALKPHOS:5,BILITOT:5,PROT:5,ALBUMIN:5 in the last 168 hours No results found for this basename: LIPASE:5,AMYLASE:5 in the last 168 hours No results found for this basename: AMMONIA:3 in the last 168 hours  CBC:  Lab 01/30/12 1816 01/29/12 1230  WBC 8.8 9.9  NEUTROABS -- --  HGB 12.5 13.2  HCT 39.5 40.5  MCV 87.8 87.1  PLT 263 281    Cardiac Enzymes: No results found for this basename: CKTOTAL:5,CKMB:5,CKMBINDEX:5,TROPONINI:5 in the last 168 hours  BNP: BNP (last 3 results)  Basename 08/13/11 0509 08/11/11 1541  PROBNP 1025.0* 868.7*     Other results:   Imaging: No results found.   Medications:     Scheduled Medications:    . atorvastatin  40 mg Oral q1800  .  busPIRone  30 mg Oral BID  . Chlorhexidine Gluconate Cloth  6 each Topical Q0600  . citalopram  40 mg Oral Daily  . digoxin  0.125 mg Oral Daily  . enoxaparin  40 mg Subcutaneous Q24H  . furosemide  80 mg Intravenous BID  . insulin aspart  0-15 Units Subcutaneous TID WC  . insulin glargine  23 Units Subcutaneous QHS  . levothyroxine  75 mcg Oral QAC breakfast  . mupirocin ointment  1 application Nasal BID  . omega-3 acid ethyl esters  2 g Oral BID  . pantoprazole  40 mg Oral BID AC  . potassium chloride  60 mEq Oral BID  . ramipril  7.5 mg Oral Daily  . rOPINIRole  1 mg Oral QHS  . sodium chloride  10-40 mL Intracatheter Q12H  . sodium chloride  3 mL Intravenous Q12H  . spironolactone  25 mg Oral Daily  . tiotropium  18 mcg Inhalation Daily  . DISCONTD: carvedilol  3.125 mg Oral BID WC  . DISCONTD: insulin glargine  20 Units Subcutaneous QHS    Infusions:    . milrinone 0.25 mcg/kg/min (02/04/12 0354)    PRN Medications: sodium chloride, acetaminophen, ondansetron (ZOFRAN) IV, sodium chloride, sodium chloride, traZODone   Assessment:  1) Acute on chronic systolic heart failure/low output  2) Acute on chronic respiratory failure with sats in 70-80s during cath - improved 3) NICM, EF 20-25%  - chemo induced cardiomyopathy  4) Cardiogenic shock  5) Diabetes mellitus  6) OSA with CPAP at night  7) Obesity  8) h/o breast cancer  9) Deconditioning 10) hypokalemia  Plan/Discussion:    Overall she is functionally improving but she remains tachycardic. Weight down 9 pounds. CVP now 4. Stop IV lasix and start Lasix 40 mg po BID.  Add Carvedilol 3.125 mg twice a day. Repeat CO-OX.  Continue Milrinone 0.25 mcg /kg/min  for low output. Attempt to wean Milrinone tomorrow if CO-OX stable. May required Milrinone at home if she does not tolerate wean.  She discussed code status with her family and she would like to  remain a full code. She has requested pastoral care consult. Will  also ask palliative care to consult for goals of care.   Continue CPAP. Saturation stable.    Glucose remains >200.  Adjust Lantus increase to 30 units per Diabetes Coordinator recommendations.   PT consult for deconditioning.  [name],[name]  7:11 AM  Patient seen and examined with Joanne Becket, [name]. We discussed all aspects of the encounter. I agree with the assessment and plan as stated above.  Continues to feel better. CVP checked personally and i got . Agree with switching lasix to po. OK to try low  dose carvedilol and follow co-ox. She remain tachycardic however. Will start milrinone wean either later today or tomorrow if co-ox ok. Discussed goals of care again and she wants to be full code however would like to talk to palliative care team to understand options.   Joanne Manansala,MD 9:05 AM

## 2012-02-04 NOTE — Progress Notes (Signed)
CARDIAC REHAB PHASE I   PRE:  Rate/Rhythm: 118 ST    BP: sitting 102/65    SaO2: 97 RA  MODE:  Ambulation: 1440 ft   POST:  Rate/Rhythm: 130 ST    BP: sitting 109/57     SaO2: 96 RA  Tolerated well. No c/o. To recliner.  1220-1300  Elissa Lovett Paul CES, ACSM

## 2012-02-05 LAB — CARBOXYHEMOGLOBIN
Methemoglobin: 1 % (ref 0.0–1.5)
O2 Saturation: 63.8 %
Total hemoglobin: 14.8 g/dL (ref 12.5–16.0)

## 2012-02-05 LAB — BASIC METABOLIC PANEL
CO2: 27 mEq/L (ref 19–32)
Chloride: 95 mEq/L — ABNORMAL LOW (ref 96–112)
Potassium: 4.4 mEq/L (ref 3.5–5.1)
Sodium: 134 mEq/L — ABNORMAL LOW (ref 135–145)

## 2012-02-05 LAB — GLUCOSE, CAPILLARY: Glucose-Capillary: 186 mg/dL — ABNORMAL HIGH (ref 70–99)

## 2012-02-05 MED ORDER — CARVEDILOL 3.125 MG PO TABS
3.1250 mg | ORAL_TABLET | Freq: Once | ORAL | Status: AC
  Administered 2012-02-05: 3.125 mg via ORAL
  Filled 2012-02-05: qty 1

## 2012-02-05 MED ORDER — CARVEDILOL 3.125 MG PO TABS
3.1250 mg | ORAL_TABLET | Freq: Two times a day (BID) | ORAL | Status: DC
  Administered 2012-02-05: 3.125 mg via ORAL
  Filled 2012-02-05 (×4): qty 1

## 2012-02-05 MED ORDER — INSULIN ASPART PROT & ASPART (70-30 MIX) 100 UNIT/ML ~~LOC~~ SUSP
50.0000 [IU] | Freq: Two times a day (BID) | SUBCUTANEOUS | Status: DC
  Administered 2012-02-05 – 2012-02-07 (×5): 50 [IU] via SUBCUTANEOUS
  Filled 2012-02-05: qty 3

## 2012-02-05 MED ORDER — RAMIPRIL 10 MG PO CAPS
10.0000 mg | ORAL_CAPSULE | Freq: Every day | ORAL | Status: DC
  Administered 2012-02-06: 10 mg via ORAL
  Filled 2012-02-05 (×2): qty 1

## 2012-02-05 MED ORDER — CARVEDILOL 6.25 MG PO TABS
6.2500 mg | ORAL_TABLET | Freq: Two times a day (BID) | ORAL | Status: DC
  Filled 2012-02-05 (×2): qty 1

## 2012-02-05 NOTE — Progress Notes (Signed)
Pt sleeping soundly. Woke her but she declined walking at this time. Encouraged to walk on her own later and tomorrow as it is the holiday. Will f/u fri with ex gl.  Ethelda Chick CES, ACSM

## 2012-02-05 NOTE — Progress Notes (Signed)
Advanced Heart Failure Rounding Note   Subjective:    [name] is a 56 year old female with history of breast cancer, obesity, HTN, diabetes, IBS, and OSA with CPAP use. She had chemo induced cardiomyopathy. Previous evaluation included cardiac MRI in July 2010 with EF 45% with myoview in 2011 showing EF 59% but no ischemia. Most recent echo in Jan 2013 where EF has fallen back to 20-25% with diffuse hypokinesis. Doppler parameters were also consistent with restrictive physiology.   She has been followed closely in the Heart Failure clinic. She was recently evaluated for orthopnea, weight gain and progressive dyspnea on exertion. She was brought to the outpatient cath lab 6/27 for RHC. Cath demonstrated decompensated HF with low cardiac output consistent with cardiogenic shock. She was admitted 01/30/12  for IV diuresis with inotrope support. Admit weight 229 pounds.   RHC 6/27  RA = 20  RV = 55/13/22  PA =56/34 (44)  PCW = 28  Fick cardiac output/index = 3.1/1.5  PVR = 5.3 Woods  FA sat = 91%  PA sat = 42%, 48%  SVC sat = 46%   Weight down 12 pounds since admit. Weight unchanged.  Overall, -10.8 liters since admit.  Yesterday carvedilol started and switched to po lasix. Milrinone continues 0.25 mcg/kg/min via PICC.  SBP improved  > 100.  HR remains > 110-120s. Able to ambulate 4 times around nursing unit without dyspnea.   Feels great.   Denies  SOB/Orthopnea/PND/CP.     Co-ox  56>74>83>62>59.8>85.9       Objective:   Weight Range:  Vital Signs:   Temp:  [97.4 F (36.3 C)-98 F (36.7 C)] 97.4 F (36.3 C) (07/03 0427) Pulse Rate:  [117-126] 118  (07/03 0326) Resp:  [16-19] 19  (07/03 0326) BP: (102-120)/(58-67) 120/67 mmHg (07/03 0400) SpO2:  [93 %-98 %] 95 % (07/03 0400) Weight:  [98.3 kg (216 lb 11.4 oz)] 98.3 kg (216 lb 11.4 oz) (07/03 0500) Last BM Date: 02/03/12  Weight change: Filed Weights   02/03/12 0700 02/04/12 0400 02/05/12 0500  Weight: 98.2 kg (216 lb  7.9 oz) 98 kg (216 lb 0.8 oz) 98.3 kg (216 lb 11.4 oz)    Intake/Output:   Intake/Output Summary (Last 24 hours) at 02/05/12 0802 Last data filed at 02/05/12 0600  Gross per 24 hour  Intake 1424.4 ml  Output   2450 ml  Net -1025.6 ml    CVP  4-5  Physical Exam: General: Chronically ill appearing. No resp difficulty HEENT: normal Neck: supple. JVP difficult to assess Carotids 2+ bilat; no bruits. No lymphadenopathy or thryomegaly appreciated. Cor: PMI nondisplaced. Regular rate & rhythm. No rubs,  or murmurs. + S3, tachy  Lungs: clear Abdomen: soft, nontender, distended. No hepatosplenomegaly. No bruits or masses. Good bowel sounds. Extremities: no cyanosis, clubbing, rash, edema Neuro: alert & orientedx3, cranial nerves grossly intact. moves all 4 extremities w/o difficulty. Affect pleasant  Telemetry: 02/05/2012  Sinus Tach 120s  Labs: Basic Metabolic Panel:  Lab 02/05/12 9604 02/04/12 0736 02/03/12 0427 02/02/12 0350 02/01/12 0500  NA 134* 131* 133* 135 137  K 4.4 4.0 4.3 4.3 4.5  CL 95* 96 93* 97 97  CO2 27 27 28 29 29   GLUCOSE 233* 288* 238* 263* 261*  BUN 17 19 16 14 15   CREATININE 0.55 0.58 0.62 0.58 0.56  CALCIUM 10.1 9.6 9.8 -- --  MG -- -- -- -- --  PHOS -- -- -- -- --    Liver  Function Tests: No results found for this basename: AST:5,ALT:5,ALKPHOS:5,BILITOT:5,PROT:5,ALBUMIN:5 in the last 168 hours No results found for this basename: LIPASE:5,AMYLASE:5 in the last 168 hours No results found for this basename: AMMONIA:3 in the last 168 hours  CBC:  Lab 01/30/12 1816 01/29/12 1230  WBC 8.8 9.9  NEUTROABS -- --  HGB 12.5 13.2  HCT 39.5 40.5  MCV 87.8 87.1  PLT 263 281    Cardiac Enzymes: No results found for this basename: CKTOTAL:5,CKMB:5,CKMBINDEX:5,TROPONINI:5 in the last 168 hours  BNP: BNP (last 3 results)  Basename 08/13/11 0509 08/11/11 1541  PROBNP 1025.0* 868.7*     Other results:   Imaging: No results found.   Medications:      Scheduled Medications:    . atorvastatin  40 mg Oral q1800  . busPIRone  30 mg Oral BID  . carvedilol  3.125 mg Oral BID WC  . Chlorhexidine Gluconate Cloth  6 each Topical Q0600  . citalopram  40 mg Oral Daily  . digoxin  0.125 mg Oral Daily  . enoxaparin  40 mg Subcutaneous Q24H  . furosemide  40 mg Oral BID  . insulin aspart  0-15 Units Subcutaneous TID WC  . insulin glargine  30 Units Subcutaneous QHS  . levothyroxine  75 mcg Oral QAC breakfast  . mupirocin ointment  1 application Nasal BID  . omega-3 acid ethyl esters  2 g Oral BID  . pantoprazole  40 mg Oral BID AC  . potassium chloride  60 mEq Oral BID  . ramipril  7.5 mg Oral Daily  . rOPINIRole  1 mg Oral QHS  . sodium chloride  10-40 mL Intracatheter Q12H  . sodium chloride  3 mL Intravenous Q12H  . spironolactone  25 mg Oral Daily  . tiotropium  18 mcg Inhalation Daily    Infusions:    . milrinone 0.25 mcg/kg/min (02/05/12 0645)    PRN Medications: sodium chloride, acetaminophen, ondansetron (ZOFRAN) IV, sodium chloride, sodium chloride, traZODone   Assessment:  1) Acute on chronic systolic heart failure/low output  2) Acute on chronic respiratory failure with sats in 70-80s during cath - improved 3) NICM, EF 20-25%  - chemo induced cardiomyopathy  4) Cardiogenic shock  5) Diabetes mellitus  6) OSA with CPAP at night  7) Obesity  8) h/o breast cancer  9) Deconditioning 10) hypokalemia  Plan/Discussion:   Symptomatically she continues to improve. Increased mobility noted. Remains tachycardic. CVP remains 4 on po lasix. CO-OX stable with addition of low dose beta blocker.Will start wean Milrinone 0.125 mcg. Repeat CO-OX at 1200. Anticipated stopping Milrionone tomorrow am if CO-OX is stable.   She discussed code status with her family and she would like to  remain a full code. Palliative Care input appreciated. Family meeting scheduled for tomorrow at 1:00.   Continue CPAP. Saturation stable.     Glucose remains >200 despite increased lantus.  Add Novolog 70/30 50 units BID. Stop lantus.   Will need HHRN upon discharge.    CLEGG,AMY NP-C  8:02 AM   Patient seen and examined with Tonye Becket, NP. We discussed all aspects of the encounter. I agree with the assessment and plan as stated above. Continues to improve though remains tachycardic. Co-ox seems a bit high. Will repeat. Begin milrinone wean today. Titrate ramipril to 10. Adjust Lantus.  Possibly home Friday with close f/u.  Daniel Bensimhon,MD 9:57 AM

## 2012-02-05 NOTE — Progress Notes (Signed)
Pt was very welcoming of visit.  [name] is a very spiritual person who feels at peace with what is going on.  She explained that she is trying to get her affairs in order for her family's sake, but she is not ready to give up fighting yet.  She says that God is holding the timetable for her life but she is going to do everything possible to be there for her family.  Pt shared her life story as she processed grief over the her current situation.  We ended the visit with prayer.  I will continue to follow. Please page me if I am needed or requested. Dellie Catholic  [phone] personal pager   [phone] oncall pager  02/05/12 1108  Clinical Encounter Type  Visited With Patient  Visit Type Spiritual support  Referral From Palliative care team  Spiritual Encounters  Spiritual Needs Emotional;Prayer  Stress Factors  Patient Stress Factors Major life changes;Health changes  Family Stress Factors Loss of control;Lack of knowledge;Family relationships

## 2012-02-06 LAB — BASIC METABOLIC PANEL
CO2: 28 mEq/L (ref 19–32)
Calcium: 10.2 mg/dL (ref 8.4–10.5)
Sodium: 135 mEq/L (ref 135–145)

## 2012-02-06 LAB — CARBOXYHEMOGLOBIN
Methemoglobin: 0.9 % (ref 0.0–1.5)
Total hemoglobin: 14.7 g/dL (ref 12.0–16.0)

## 2012-02-06 LAB — GLUCOSE, CAPILLARY: Glucose-Capillary: 176 mg/dL — ABNORMAL HIGH (ref 70–99)

## 2012-02-06 MED ORDER — CARVEDILOL 6.25 MG PO TABS
6.2500 mg | ORAL_TABLET | Freq: Two times a day (BID) | ORAL | Status: DC
  Administered 2012-02-06 – 2012-02-07 (×3): 6.25 mg via ORAL
  Filled 2012-02-06 (×5): qty 1

## 2012-02-06 MED ORDER — FUROSEMIDE 10 MG/ML IJ SOLN: 80.0000 mg | Freq: Two times a day (BID) | INTRAMUSCULAR | Status: DC

## 2012-02-06 MED ORDER — FUROSEMIDE 40 MG PO TABS
60.0000 mg | ORAL_TABLET | Freq: Two times a day (BID) | ORAL | Status: DC
  Administered 2012-02-06: 60 mg via ORAL
  Filled 2012-02-06 (×4): qty 1

## 2012-02-06 MED ORDER — METOLAZONE 2.5 MG PO TABS
2.5000 mg | ORAL_TABLET | Freq: Once | ORAL | Status: AC
  Administered 2012-02-06: 2.5 mg via ORAL
  Filled 2012-02-06: qty 1

## 2012-02-06 MED ORDER — POTASSIUM CHLORIDE CRYS ER 20 MEQ PO TBCR: 40.0000 meq | EXTENDED_RELEASE_TABLET | Freq: Once | ORAL | Status: DC

## 2012-02-06 NOTE — Progress Notes (Signed)
Pt. Stated that she would put herself on CPAP when ready for bed. Pt. Was made aware to call RT if she needed assistance.

## 2012-02-06 NOTE — Progress Notes (Signed)
Pt. Stated that she would put CPAP on herself when ready for bed. Pt. Was made aware to call RT if she needed assistance.

## 2012-02-06 NOTE — Consult Note (Signed)
Consult Note from the Palliative Medicine Team at [facility] Patient ZO:XWRUEA D [name]      DOB: [birth year]      VWU:[id number]   Consult Requested by: Dr Gala Romney     PCP: [name], MD Reason for Consultation: Goals of care     Phone Number:[phone]  Assessment and Plan: CHF Dyspnea  Discussed in detail with patient regarding current medical condition and high  probability of similar episodes in future, patient understands the situation and wants to work on taking care of finances and funeral arrangements before working on her advance directives. Patient was given hard copy information  advance directives. She will follow with Dr Gala Romney as outpatient.  1. Code Status: Full Code 2. Symptom Control: Dyspnea- controlled at this time with lasix, milrinone 3. Psycho/Social: patient's daughter and mother could not come for the meeting, patient lives with her mother and brother and is supposed to be discharged home with home health. 4. Disposition: Home with home health  Patient Documents Completed or Given: Document Given Completed  Advanced Directives Pkt           X   MOST    DNR    Gone from My Sight    Hard Choices      Brief HPI: 56 year old female with history of breast cancer, obesity, HTN, diabetes, IBS, and OSA with CPAP use, chemo induced cardiomyopathy who came with progressive dyspnea on exertion, orthopnea and weight gain. Patient underwent cardiac catheterization on 6/27 which showed decompensated heart failure with low cardiac output consistent with cardiogenic shock.Patient improved with iv diuresis and inotrope support. At this time patient says she is at her baseline, discussed with patient regarding code status.   ROS: Anxiety- denies Dyspnea- controlled Constipation- denies Pain- denies Insomnia- takes tylenol pm Nausea- denies Depression- denies     PMH:  Past Medical History  Diagnosis Date  . 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 DYSLIPIDEMIA 05/01/2009    . OBESITY 07/24/2007  . OBSTRUCTIVE SLEEP APNEA 07/24/2007    with CPAP compliance  . RESTLESS LEG SYNDROME 07/24/2007  . SYSTOLIC HEART FAILURE, CHRONIC 01/05/2009  . TACHYCARDIA 10/25/2008  . Palpitations 09/20/2010  . 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 Hypertension   . 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 Previous EF 25%. ECHO 6/08 EF 55% ECHO 5/10 EF 30-35% MRI 7/10 EF 45% Myoview 10/11 59% no ischemia  . History of breast cancer 2006    with return in 2008  . Osteoarthritis   . Anxiety   . Depression     followed by a psychiatrist  . Asthma   . GERD (gastroesophageal reflux disease)   . Gastroparesis   . IBS (irritable bowel syndrome)      PSH: Past Surgical History  Procedure Date  . Mastectomy     right  . Abdominal hysterectomy 1991   I have reviewed the FH and SH and  If appropriate update it with new information. Allergies  Allergen Reactions  . Aspirin Hives  . Ciprofloxacin Hives  . Codeine Nausea Only  . Naproxen Hives  . Sulfonamide Derivatives Hives  . Tape Rash    Also allergic to metal   Scheduled Meds:   . atorvastatin  40 mg Oral q1800  . busPIRone  30 mg Oral BID  . carvedilol  6.25 mg Oral BID WC  . citalopram  40 mg Oral Daily  . digoxin  0.125 mg Oral Daily  . enoxaparin  40 mg Subcutaneous Q24H  . furosemide  60 mg Oral BID  . insulin aspart  0-15 Units Subcutaneous TID WC  . insulin aspart protamine-insulin aspart  50 Units Subcutaneous BID WC  . levothyroxine  75 mcg Oral QAC breakfast  . metolazone  2.5 mg Oral Once  . omega-3 acid ethyl esters  2 g Oral BID  . pantoprazole  40 mg Oral BID AC  . potassium chloride  60 mEq Oral BID  . ramipril  10 mg Oral Daily  . rOPINIRole  1 mg Oral QHS  . sodium chloride  10-40 mL Intracatheter Q12H  . sodium chloride  3 mL Intravenous Q12H  . spironolactone  25 mg Oral Daily  . tiotropium  18 mcg Inhalation  Daily  . DISCONTD: carvedilol  3.125 mg Oral BID WC  . DISCONTD: furosemide  80 mg Intravenous BID  . DISCONTD: furosemide  40 mg Oral BID  . DISCONTD: potassium chloride  40 mEq Oral Once   Continuous Infusions:   . milrinone 0.125 mcg/kg/min (02/06/12 1200)   PRN Meds:.sodium chloride, acetaminophen, ondansetron (ZOFRAN) IV, sodium chloride, sodium chloride, traZODone    BP 87/54  Pulse 102  Temp 97.3 F (36.3 C) (Oral)  Resp 21  Ht 5\' 5"  (1.651 m)  Wt 97.3 kg (214 lb 8.1 oz)  BMI 35.70 kg/m2  SpO2 95%   PPS: 70%   Intake/Output Summary (Last 24 hours) at 02/06/12 1344 Last data filed at 02/06/12 1200  Gross per 24 hour  Intake  742.8 ml  Output   2100 ml  Net -1357.2 ml     Physical Exam:  General: appear in no acute distress HEENT:  Oral mucosa is pink and moist Chest: Clear bilaterally CVS: S1S2 RRR Abdomen: Soft, nontender Ext: No edema Neuro: AO x 3, has decision making capacity  Labs: CBC    Component Value Date/Time   WBC 8.8 01/30/2012 1816   WBC 9.2 01/15/2011 1342   RBC 4.50 01/30/2012 1816   RBC 4.25 01/15/2011 1342   HGB 12.5 01/30/2012 1816   HGB 12.4 01/15/2011 1342   HCT 39.5 01/30/2012 1816   HCT 36.7 01/15/2011 1342   PLT 263 01/30/2012 1816   PLT 289 01/15/2011 1342   MCV 87.8 01/30/2012 1816   MCV 86.4 01/15/2011 1342   MCH 27.8 01/30/2012 1816   MCH 29.1 01/15/2011 1342   MCHC 31.6 01/30/2012 1816   MCHC 33.8 01/15/2011 1342   RDW 16.8* 01/30/2012 1816   RDW 16.1* 01/15/2011 1342   LYMPHSABS 2.0 08/11/2011 1542   LYMPHSABS 2.3 01/15/2011 1342   MONOABS 0.6 08/11/2011 1542   MONOABS 0.7 01/15/2011 1342   EOSABS 0.2 08/11/2011 1542   EOSABS 0.2 01/15/2011 1342   BASOSABS 0.1 08/11/2011 1542   BASOSABS 0.1 01/15/2011 1342    BMET    Component Value Date/Time   NA 135 02/06/2012 0600   K 4.1 02/06/2012 0600   CL 95* 02/06/2012 0600   CO2 28 02/06/2012 0600   GLUCOSE 187* 02/06/2012 0600   BUN 17 02/06/2012 0600   CREATININE 0.59 02/06/2012 0600   CALCIUM  10.2 02/06/2012 0600   GFRNONAA >90 02/06/2012 0600   GFRAA >90 02/06/2012 0600    CMP     Component Value Date/Time   NA 135 02/06/2012 0600   K 4.1 02/06/2012 0600   CL 95* 02/06/2012 0600   CO2 28 02/06/2012 0600   GLUCOSE 187* 02/06/2012 0600  BUN 17 02/06/2012 0600   CREATININE 0.59 02/06/2012 0600   CALCIUM 10.2 02/06/2012 0600   PROT 7.3 08/11/2011 1542   ALBUMIN 3.2* 08/11/2011 1542   AST 24 08/11/2011 1542   ALT 15 08/11/2011 1542   ALKPHOS 76 08/11/2011 1542   BILITOT 0.5 08/11/2011 1542   GFRNONAA >90 02/06/2012 0600   GFRAA >90 02/06/2012 0600       Time In Time Out Total Time Spent with Patient Total Overall Time  1 pm 2 pm 45 min 60 min    Greater than 50%  of this time was spent counseling and coordinating care related to the above assessment and plan.

## 2012-02-06 NOTE — Progress Notes (Addendum)
Advanced Heart Failure Rounding Note   Subjective:    [name] is a 56 year old female with history of breast cancer, obesity, HTN, diabetes, IBS, and OSA with CPAP use. She had chemo induced cardiomyopathy. Previous evaluation included cardiac MRI in July 2010 with EF 45% with myoview in 2011 showing EF 59% but no ischemia. Most recent echo in Jan 2013 where EF has fallen back to 20-25% with diffuse hypokinesis. Doppler parameters were also consistent with restrictive physiology.   She has been followed closely in the Heart Failure clinic. She was recently evaluated for orthopnea, weight gain and progressive dyspnea on exertion. She was brought to the outpatient cath lab 6/27 for RHC. Cath demonstrated decompensated HF with low cardiac output consistent with cardiogenic shock. She was admitted 01/30/12  for IV diuresis with inotrope support. Admit weight 229 pounds.   RHC 6/27  RA = 20  RV = 55/13/22  PA =56/34 (44)  PCW = 28  Fick cardiac output/index = 3.1/1.5  PVR = 5.3 Woods  FA sat = 91%  PA sat = 42%, 48%  SVC sat = 46%   Weight down another 2 pounds. Now 15 pounds since admit. Milrinone stopped yesterday. Feels great. Walked 3 laps around entire unit.  Denies  SOB/Orthopnea/PND/CP. Remains tachycardic.  Co-ox at 56%   Objective:   Weight Range:  Vital Signs:   Temp:  [97.2 F (36.2 C)-98.4 F (36.9 C)] 97.2 F (36.2 C) (07/04 0745) Pulse Rate:  [115-120] 118  (07/04 0745) Resp:  [16-20] 17  (07/04 0745) BP: (88-113)/(47-69) 113/54 mmHg (07/04 0745) SpO2:  [93 %-97 %] 96 % (07/04 0745) Weight:  [97.3 kg (214 lb 8.1 oz)] 97.3 kg (214 lb 8.1 oz) (07/04 0454) Last BM Date: 02/04/12  Weight change: Filed Weights   02/04/12 0400 02/05/12 0500 02/06/12 0454  Weight: 98 kg (216 lb 0.8 oz) 98.3 kg (216 lb 11.4 oz) 97.3 kg (214 lb 8.1 oz)    Intake/Output:   Intake/Output Summary (Last 24 hours) at 02/06/12 0855 Last data filed at 02/06/12 0400  Gross per 24 hour    Intake 770.28 ml  Output   2625 ml  Net -1854.72 ml    CVP  (checked personally) 11-12  Physical Exam: General: No distress. Looks much better.  HEENT: normal Neck: supple. JVP difficult to assess Carotids 2+ bilat; no bruits. No lymphadenopathy or thryomegaly appreciated. Cor: PMI nondisplaced. Regular rate & rhythm. No rubs,  or murmurs. + S3, tachy  Lungs: clear Abdomen: soft, nontender, distended. No hepatosplenomegaly. No bruits or masses. Good bowel sounds. Extremities: no cyanosis, clubbing, rash, edema Neuro: alert & orientedx3, cranial nerves grossly intact. moves all 4 extremities w/o difficulty. Affect pleasant  Telemetry: 02/06/2012  Sinus Tach 115-120s  Labs: Basic Metabolic Panel:  Lab 02/06/12 1610 02/06/12 0443 02/05/12 0700 02/04/12 0736 02/03/12 0427  NA 135 146* 134* 131* 133*  K 4.1 1.6* 4.4 4.0 4.3  CL 95* 124* 95* 96 93*  CO2 28 13* 27 27 28   GLUCOSE 187* 100* 233* 288* 238*  BUN 17 9 17 19 16   CREATININE 0.59 0.25* 0.55 0.58 0.62  CALCIUM 10.2 3.8* 10.1 -- --  MG -- -- -- -- --  PHOS -- -- -- -- --    Liver Function Tests: No results found for this basename: AST:5,ALT:5,ALKPHOS:5,BILITOT:5,PROT:5,ALBUMIN:5 in the last 168 hours No results found for this basename: LIPASE:5,AMYLASE:5 in the last 168 hours No results found for this basename: AMMONIA:3 in the last  168 hours  CBC:  Lab 01/30/12 1816  WBC 8.8  NEUTROABS --  HGB 12.5  HCT 39.5  MCV 87.8  PLT 263    Cardiac Enzymes: No results found for this basename: CKTOTAL:5,CKMB:5,CKMBINDEX:5,TROPONINI:5 in the last 168 hours  BNP: BNP (last 3 results)  Basename 08/13/11 0509 08/11/11 1541  PROBNP 1025.0* 868.7*     Other results:   Imaging: No results found.   Medications:     Scheduled Medications:    . atorvastatin  40 mg Oral q1800  . busPIRone  30 mg Oral BID  . carvedilol  3.125 mg Oral Once  . carvedilol  3.125 mg Oral BID WC  . citalopram  40 mg Oral Daily  .  digoxin  0.125 mg Oral Daily  . enoxaparin  40 mg Subcutaneous Q24H  . furosemide  40 mg Oral BID  . insulin aspart  0-15 Units Subcutaneous TID WC  . insulin aspart protamine-insulin aspart  50 Units Subcutaneous BID WC  . levothyroxine  75 mcg Oral QAC breakfast  . omega-3 acid ethyl esters  2 g Oral BID  . pantoprazole  40 mg Oral BID AC  . potassium chloride  60 mEq Oral BID  . ramipril  10 mg Oral Daily  . rOPINIRole  1 mg Oral QHS  . sodium chloride  10-40 mL Intracatheter Q12H  . sodium chloride  3 mL Intravenous Q12H  . spironolactone  25 mg Oral Daily  . tiotropium  18 mcg Inhalation Daily  . DISCONTD: carvedilol  6.25 mg Oral BID WC  . DISCONTD: ramipril  7.5 mg Oral Daily    Infusions:    . milrinone 0.125 mcg/kg/min (02/06/12 0645)    PRN Medications: sodium chloride, acetaminophen, ondansetron (ZOFRAN) IV, sodium chloride, sodium chloride, traZODone   Assessment:  1) Acute on chronic systolic heart failure/low output  2) Acute on chronic respiratory failure with sats in 70-80s during cath - improved 3) NICM, EF 20-25%  - chemo induced cardiomyopathy  4) Cardiogenic shock  5) Diabetes mellitus  6) OSA with CPAP at night  7) Obesity  8) h/o breast cancer  9) Deconditioning 10) Hypokalemia  Plan/Discussion:   Symptomatically much better. Ambulating without difficulty. Co-ox stable off milrinone yet she remains tachycardic. Will try to push carvedilol gently. CVP remains high. Will push diuresis.  CBGs much improved.   Will watch overnight and probable d/c in am. (would use lasix 60 bid on d/c). Will need HHRN upon discharge.  I would favor leaving PICC in for 1 week post d/c to allow [place] to check a co-ox and also I worry there is a chance she may need home inotropes.   Daniel Bensimhon,MD 8:55 AM

## 2012-02-06 NOTE — Progress Notes (Signed)
Received a critical lab. Result of K=1.6 & calcium=3.8. In doubt of the result I did not call attending physician right away instead redraw BMET & ordered stat. Satisfied with 2nd result.

## 2012-02-07 ENCOUNTER — Ambulatory Visit: Payer: Medicare Other | Admitting: Endocrinology

## 2012-02-07 DIAGNOSIS — Z0289 Encounter for other administrative examinations: Secondary | ICD-10-CM

## 2012-02-07 LAB — BASIC METABOLIC PANEL
BUN: 20 mg/dL (ref 6–23)
GFR calc Af Amer: >90 mL/min (ref >90)
GFR calc non Af Amer: >90 mL/min (ref >90)
Potassium: 3.8 mEq/L (ref 3.5–5.1)
Sodium: 135 mEq/L (ref 135–145)

## 2012-02-07 LAB — CARBOXYHEMOGLOBIN
Carboxyhemoglobin: 1.5 % (ref 0.5–1.5)
O2 Saturation: 62.2 %

## 2012-02-07 MED ORDER — FUROSEMIDE 20 MG PO TABS: 60.0000 mg | ORAL_TABLET | Freq: Two times a day (BID) | ORAL | Status: DC

## 2012-02-07 MED ORDER — CARVEDILOL 12.5 MG PO TABS: 6.2500 mg | ORAL_TABLET | Freq: Two times a day (BID) | ORAL | Status: DC

## 2012-02-07 MED ORDER — HEPARIN SOD (PORK) LOCK FLUSH 100 UNIT/ML IV SOLN: 250.0000 [IU] | INTRAVENOUS | Status: DC | PRN

## 2012-02-07 MED ORDER — HEPARIN SOD (PORK) LOCK FLUSH 100 UNIT/ML IV SOLN
500.0000 [IU] | Freq: Once | INTRAVENOUS | Status: AC
  Administered 2012-02-07: 500 [IU] via INTRAVENOUS
  Filled 2012-02-07: qty 5

## 2012-02-07 MED ORDER — FUROSEMIDE 40 MG PO TABS
60.0000 mg | ORAL_TABLET | Freq: Two times a day (BID) | ORAL | Status: DC
  Filled 2012-02-07 (×2): qty 1

## 2012-02-07 MED ORDER — RAMIPRIL 2.5 MG PO TABS: 10.0000 mg | ORAL_TABLET | Freq: Every evening | ORAL | Status: DC

## 2012-02-07 NOTE — Progress Notes (Signed)
Advanced Heart Failure Rounding Note   Subjective:    [name] is a 56 year old female with history of breast cancer, obesity, HTN, diabetes, IBS, and OSA with CPAP use. She had chemo induced cardiomyopathy. Previous evaluation included cardiac MRI in July 2010 with EF 45% with myoview in 2011 showing EF 59% but no ischemia. Most recent echo in Jan 2013 where EF has fallen back to 20-25% with diffuse hypokinesis. Doppler parameters were also consistent with restrictive physiology.   She has been followed closely in the Heart Failure clinic. She was recently evaluated for orthopnea, weight gain and progressive dyspnea on exertion. She was brought to the outpatient cath lab 6/27 for RHC. Cath demonstrated decompensated HF with low cardiac output consistent with cardiogenic shock. She was admitted 01/30/12  for IV diuresis with inotrope support. Admit weight 229 pounds.   RHC 6/27  RA = 20  RV = 55/13/22  PA =56/34 (44)  PCW = 28  Fick cardiac output/index = 3.1/1.5  PVR = 5.3 Woods  FA sat = 91%  PA sat = 42%, 48%  SVC sat = 46%  Milrinone stopped yesterday. Diuresed well again last night. Feels minimally dizzy. Breathing great! Still tachy but improved HR 105-115.   Co-ox at 61%   Objective:   Weight Range:  Vital Signs:   Temp:  [97.2 F (36.2 C)-98.5 F (36.9 C)] 98.5 F (36.9 C) (07/05 0400) Pulse Rate:  [102-118] 118  (07/04 1800) Resp:  [17-21] 18  (07/05 0339) BP: (77-121)/(43-69) 110/68 mmHg (07/05 0339) SpO2:  [92 %-98 %] 93 % (07/05 0400) Weight:  [96.8 kg (213 lb 6.5 oz)] 96.8 kg (213 lb 6.5 oz) (07/05 0500) Last BM Date: 02/06/12  Weight change: Filed Weights   02/05/12 0500 02/06/12 0454 02/07/12 0500  Weight: 98.3 kg (216 lb 11.4 oz) 97.3 kg (214 lb 8.1 oz) 96.8 kg (213 lb 6.5 oz)    Intake/Output:   Intake/Output Summary (Last 24 hours) at 02/07/12 0711 Last data filed at 02/07/12 0500  Gross per 24 hour  Intake  536.2 ml  Output   2350 ml  Net  -1813.8 ml    CVP  (checked personally) 11-12  Physical Exam: General: No distress. Looks much better.  HEENT: normal Neck: supple. JVP difficult to assess Carotids 2+ bilat; no bruits. No lymphadenopathy or thryomegaly appreciated. Cor: PMI nondisplaced. Regular rate & rhythm. No rubs,  or murmurs. + S3, tachy  Lungs: clear Abdomen: soft, nontender, distended.  No bruits or masses. Good bowel sounds. Extremities: no cyanosis, clubbing, rash, edema Neuro: alert & orientedx3, cranial nerves grossly intact. moves all 4 extremities w/o difficulty. Affect pleasant  Telemetry: 02/07/2012  Sinus Tach 105-115s  Labs: Basic Metabolic Panel:  Lab 02/07/12 4782 02/06/12 0600 02/06/12 0443 02/05/12 0700 02/04/12 0736  NA 135 135 146* 134* 131*  K 3.8 4.1 1.6* 4.4 4.0  CL 92* 95* 124* 95* 96  CO2 29 28 13* 27 27  GLUCOSE 171* 187* 100* 233* 288*  BUN 20 17 9 17 19   CREATININE 0.70 0.59 0.25* 0.55 0.58  CALCIUM 10.5 10.2 3.8* -- --  MG -- -- -- -- --  PHOS -- -- -- -- --    Liver Function Tests: No results found for this basename: AST:5,ALT:5,ALKPHOS:5,BILITOT:5,PROT:5,ALBUMIN:5 in the last 168 hours No results found for this basename: LIPASE:5,AMYLASE:5 in the last 168 hours No results found for this basename: AMMONIA:3 in the last 168 hours  CBC: No results found for this  basename: WBC:5,NEUTROABS:5,HGB:5,HCT:5,MCV:5,PLT:5 in the last 168 hours  Cardiac Enzymes: No results found for this basename: CKTOTAL:5,CKMB:5,CKMBINDEX:5,TROPONINI:5 in the last 168 hours  BNP: BNP (last 3 results)  Basename 08/13/11 0509 08/11/11 1541  PROBNP 1025.0* 868.7*     Other results:   Imaging: No results found.   Medications:     Scheduled Medications:    . atorvastatin  40 mg Oral q1800  . busPIRone  30 mg Oral BID  . carvedilol  6.25 mg Oral BID WC  . citalopram  40 mg Oral Daily  . digoxin  0.125 mg Oral Daily  . enoxaparin  40 mg Subcutaneous Q24H  . furosemide  60 mg Oral  BID  . insulin aspart  0-15 Units Subcutaneous TID WC  . insulin aspart protamine-insulin aspart  50 Units Subcutaneous BID WC  . levothyroxine  75 mcg Oral QAC breakfast  . metolazone  2.5 mg Oral Once  . omega-3 acid ethyl esters  2 g Oral BID  . pantoprazole  40 mg Oral BID AC  . potassium chloride  60 mEq Oral BID  . ramipril  10 mg Oral Daily  . rOPINIRole  1 mg Oral QHS  . sodium chloride  10-40 mL Intracatheter Q12H  . sodium chloride  3 mL Intravenous Q12H  . spironolactone  25 mg Oral Daily  . tiotropium  18 mcg Inhalation Daily  . DISCONTD: carvedilol  3.125 mg Oral BID WC  . DISCONTD: furosemide  80 mg Intravenous BID  . DISCONTD: furosemide  40 mg Oral BID  . DISCONTD: potassium chloride  40 mEq Oral Once    Infusions:    . DISCONTD: milrinone 0.125 mcg/kg/min (02/06/12 1200)    PRN Medications: sodium chloride, acetaminophen, ondansetron (ZOFRAN) IV, sodium chloride, sodium chloride, traZODone   Assessment:  1) Acute on chronic systolic heart failure/low output  2) Acute on chronic respiratory failure with sats in 70-80s during cath - improved 3) NICM, EF 20-25%  - chemo induced cardiomyopathy  4) Cardiogenic shock  5) Diabetes mellitus  6) OSA with CPAP at night  7) Obesity  8) h/o breast cancer  9) Deconditioning 10) Hypokalemia  Plan/Discussion:   Symptomatically much better. Weight down 16 pounds. Co-ox stable off milrinone yet she remains tachycardic.   Will d/c today. (would use lasix 60 bid on d/c). Will need HHRN upon discharge.  I would favor leaving PICC in for 1 week post d/c to allow [place] to check a co-ox and also I worry there is a chance she may need home inotropes.   Daniel Bensimhon,MD 7:11 AM

## 2012-02-07 NOTE — Progress Notes (Signed)
Ex gl. Given and discussed [facility]. Not sure if insurance will cover but pt requests her name be sent to [facility] to give it a try. Pt knows re: diet and daily wts. [phone] Ethelda Chick CES, ACSM

## 2012-02-07 NOTE — Discharge Summary (Signed)
Advanced Heart Failure Team  Discharge Summary   Patient ID: [name] MRN: [id number], DOB/AGE: 10/30/1955 56 y.o. 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 female with history of breast cancer s/p chemotherapy, obesity, HTN, diabetes, IBS, and OSA with CPAP use. She had chemo induced cardiomyopathy with previous EF of 45% that has most recently fallen to 20-25% in Jan of 2013.  Echo at that time also showed parameters consistent with restrictive physiology.    She was evaluated in the HF clinic for progressive dyspnea, orthopnea, and weight gain.  Her dyspnea was felt to be out of proportion to her physical exam and therefore they proceeded with RHC to rule out low output symptoms.  RHC on 6/27 showed decompensated HF with low cardiac output consistent with cardiogenic shock.    RHC 6/27  RA = 20  RV = 55/13/22  PA =56/34 (44)  PCW = 28  Fick cardiac output/index = 3.1/1.5  PVR = 5.3 Woods  FA sat = 91%  PA sat = 42%, 48%  SVC sat = 46%  She was admitted to the stepdown unit for inotrope support and IV diuresis.  Milrinone was started at 0.25 mcg/kg/min with IV lasix, her carvedilol was held due to acute decompensation.  She received a PICC line to follow CVPs and Co-ox's on 6/28.   Initial co-ox on milrinone was noted to be 56%.  ACE-I titration limited by hypotension.  She remained tachycardic throughout admission, carvedilol added back on 7/2 and she tolerated  this well.  After diuresis of 16 pounds (admit 229 pounds).  Milrinone wean began on 7/3 with discontinuation on 7/4.  Co-ox on 7/5 remained 61% off of milrinone.  She remained symptomatically better after diuresis.    Ambulated throughout the unit with Cardiac Rehab without difficulty.     Dr. Gala Romney was concerned about her overall prognosis. She is not candidate for advanced therapies due to lack of social support.  She may require inotrope support at home therefore will leave her PICC in for the next week in case they need to be restarted at follow up visit.     Palliative Care consult was obtained this stay.  After discussion with the patient and her daughter she will remain Full Code and be discharged with home health.  We appreciated the consult.     Discharge Weight Range: 213-215 pounds.  Discharge Vitals: Blood pressure 111/69, pulse 118, temperature 98.1 F (36.7 C), temperature source Oral, resp. rate 19, height 5\' 5"  (1.651 m), weight 96.8 kg (213 lb 6.5 oz), SpO2 94.00%.  Labs: Lab Results  Component Value Date   WBC 8.8 01/30/2012   HGB 12.5 01/30/2012   HCT 39.5 01/30/2012   MCV 87.8 01/30/2012   PLT 263 01/30/2012     Lab 02/07/12 0500  NA 135  K 3.8  CL 92*  CO2 29  BUN 20  CREATININE 0.70  CALCIUM 10.5  PROT --  BILITOT --  ALKPHOS --  ALT --  AST --  GLUCOSE 171*   Lab Results  Component Value Date   CHOL  Value: 146        ATP III CLASSIFICATION:  <200     mg/dL   Desirable  409-811  mg/dL   Borderline High  >=914    mg/dL   High        78/09/9560   HDL 38* 05/05/2010   LDLCALC  Value: UNABLE TO CALCULATE IF TRIGLYCERIDE OVER 400 mg/dL        Total Cholesterol/HDL:CHD Risk Coronary Heart Disease Risk Table                     Men   Women  1/2 Average Risk   3.4   3.3  Average Risk       5.0   4.4  2 X Average Risk   9.6   7.1  3 X Average Risk  23.4   11.0        Use the calculated Patient Ratio above and the CHD Risk Table to determine the patient's CHD Risk.         ATP III CLASSIFICATION (LDL):  <100     mg/dL   Optimal  130-865  mg/dL   Near or Above                    Optimal  130-159  mg/dL   Borderline  784-696  mg/dL   High  >295     mg/dL   Very High 28/11/1322   TRIG 685* 05/05/2010   BNP (last 3 results)  Basename 08/13/11 0509 08/11/11 1541  PROBNP 1025.0* 868.7*    Diagnostic Studies/Procedures   No results found.  Discharge Medications   Medication List  As of 02/07/2012  7:48 AM   STOP taking these medications         traZODone 50 MG tablet         TAKE these medications         atorvastatin 40 MG tablet   Commonly known as: LIPITOR   Take 40 mg by mouth daily.      busPIRone 30 MG tablet   Commonly known as: BUSPAR   Take 30 mg by mouth 2 (two) times daily.      carvedilol 12.5 MG tablet   Commonly known as: COREG   Take 0.5 tablets (6.25 mg total) by mouth 2 (two) times daily with a meal.      citalopram 20 MG tablet   Commonly known as: CELEXA   2 tablets by mouth once daily      cyclobenzaprine 10 MG tablet   Commonly known as: FLEXERIL   Take 10 mg by mouth 3 (three) times daily as needed. For pain      digoxin 0.125 MG tablet   Commonly known as: LANOXIN   Take 1 tablet (125 mcg total) by mouth daily.      diphenhydramine-acetaminophen 25-500 MG Tabs   Commonly known as: TYLENOL PM   Take 1 tablet by mouth at bedtime as needed. For insomnia      furosemide 20 MG tablet   Commonly known as: LASIX   Take 3 tablets (60 mg total) by mouth 2 (two) times daily.      Glucosamine 500 MG Caps   Take 1 capsule by mouth daily.      insulin lispro  protamine-insulin lispro (75-25) 100 UNIT/ML Susp   Commonly known as: HUMALOG 75/25   95 units with breakfast, and 95 units with the evening meal.      Insulin Pen Needle 31G X 8 MM Misc   18/day as directed dx 250.00      levothyroxine 75 MCG tablet   Commonly known as: SYNTHROID, LEVOTHROID   Take 75 mcg by mouth daily.      loperamide 2 MG tablet    Commonly known as: IMODIUM A-D   Take 2 mg by mouth as needed. diarrhea      metolazone 2.5 MG tablet   Commonly known as: ZAROXOLYN   Take 2.5 mg by mouth daily as needed. For fluid retention      mometasone 50 MCG/ACT nasal spray   Commonly known as: NASONEX   Place 2 sprays into the nose daily as needed. allergies      nitroGLYCERIN 0.4 MG SL tablet   Commonly known as: NITROSTAT   Place 0.4 mg under the tongue every 5 (five) minutes as needed. For chest pain      omega-3 acid ethyl esters 1 G capsule   Commonly known as: LOVAZA   Take 4 capsules by mouth daily.      pantoprazole 40 MG tablet   Commonly known as: PROTONIX   Take 40 mg by mouth 2 (two) times daily.      potassium chloride SA 20 MEQ tablet   Commonly known as: K-DUR,KLOR-CON   Take 60 mEq by mouth 2 (two) times daily.      PROVENTIL HFA 108 (90 BASE) MCG/ACT inhaler   Generic drug: albuterol   Inhale 2 puffs into the lungs every 6 (six) hours as needed. For shortness of breath      ramipril 2.5 MG tablet   Commonly known as: ALTACE   Take 4 tablets (10 mg total) by mouth Nightly.      rOPINIRole 1 MG tablet   Commonly known as: REQUIP   Take 1 mg by mouth at bedtime.      spironolactone 25 MG tablet   Commonly known as: ALDACTONE   Take 1 tablet (25 mg total) by mouth daily.      SUMAtriptan 25 MG tablet   Commonly known as: IMITREX   Take 25 mg by mouth every 2 (two) hours as needed. For migraine      tiotropium 18 MCG inhalation capsule   Commonly known as: SPIRIVA   Place 18 mcg into inhaler and inhale daily.      traMADol 50 MG tablet   Commonly known as: ULTRAM   Take 50 mg by mouth every 6 (six) hours as needed. pain      TYLENOL ARTHRITIS PAIN 650 MG CR tablet   Generic drug: acetaminophen   Take 1,300 mg by mouth every 8 (eight) hours as needed. pain            Disposition   The patient will be discharged in stable condition to home. Discharge Orders    Future Appointments:  Provider: Department: Dept Phone: Center:   02/07/2012 1:45 PM Romero Belling, MD [facility] [phone] [facility]   02/18/2012 4:15 PM Sherrie Mustache [facility] [phone] None   02/25/2012 3:30 PM Pierce Crane, MD [facility] [phone] None     Future Orders Please Complete By Expires   Diet - low sodium heart healthy      Increase activity slowly      Heart Failure patients record  your daily weight using the same scale at the same time of day      Beta Blocker already ordered      ACE Inhibitor / ARB already ordered        Follow-up Information    Follow up with Arvilla Meres, MD on 02/12/2012. (at 2p   [employer] Code 7000))    Contact information:   [address] [place] [address] [phone]            Duration of Discharge Encounter: Greater than 35 minutes   Signed, Robbi Garter  02/07/2012, 7:48 AM   Patient seen and examined with Ulyess Blossom, PA-C. We discussed all aspects of the encounter. I agree with the assessment and plan as stated above. She is much improved after large diuresis but still a bit tenuous. Extensive HF education provided. Meds titrated. We will follow her closely in clinic. Has been seen by Palliative Care team and goals of care discussed. She remains Full Code.   Daniel Bensimhon,MD 11:34 PM

## 2012-02-08 DIAGNOSIS — I509 Heart failure, unspecified: Secondary | ICD-10-CM

## 2012-02-10 LAB — BASIC METABOLIC PANEL
CO2: 13 mEq/L — ABNORMAL LOW (ref 19–32)
Calcium: <4 mg/dL — CL (ref 8.4–10.5)
Chloride: 124 mEq/L — ABNORMAL HIGH (ref 96–112)
Sodium: 146 mEq/L — ABNORMAL HIGH (ref 135–145)

## 2012-02-12 ENCOUNTER — Ambulatory Visit (HOSPITAL_COMMUNITY)
Admission: RE | Admit: 2012-02-12 | Discharge: 2012-02-12 | Disposition: A | Discharge status: Home / Self Care | Payer: Medicare Other | Source: Ambulatory Visit | Attending: Internal Medicine | Admitting: Internal Medicine

## 2012-02-12 ENCOUNTER — Telehealth (HOSPITAL_COMMUNITY): Payer: Self-pay | Admitting: Adult Health

## 2012-02-12 VITALS — BP 92/60 | HR 105 | Wt 212.5 lb

## 2012-02-12 DIAGNOSIS — F3289 Other specified depressive episodes: Secondary | ICD-10-CM | POA: Insufficient documentation

## 2012-02-12 DIAGNOSIS — R000 Tachycardia, unspecified: Secondary | ICD-10-CM | POA: Insufficient documentation

## 2012-02-12 DIAGNOSIS — F411 Generalized anxiety disorder: Secondary | ICD-10-CM | POA: Insufficient documentation

## 2012-02-12 DIAGNOSIS — K219 Gastro-esophageal reflux disease without esophagitis: Secondary | ICD-10-CM | POA: Insufficient documentation

## 2012-02-12 DIAGNOSIS — E039 Hypothyroidism, unspecified: Secondary | ICD-10-CM | POA: Insufficient documentation

## 2012-02-12 DIAGNOSIS — Z794 Long term (current) use of insulin: Secondary | ICD-10-CM | POA: Insufficient documentation

## 2012-02-12 DIAGNOSIS — I509 Heart failure, unspecified: Secondary | ICD-10-CM | POA: Insufficient documentation

## 2012-02-12 DIAGNOSIS — K3184 Gastroparesis: Secondary | ICD-10-CM | POA: Insufficient documentation

## 2012-02-12 DIAGNOSIS — R002 Palpitations: Secondary | ICD-10-CM | POA: Insufficient documentation

## 2012-02-12 DIAGNOSIS — E119 Type 2 diabetes mellitus without complications: Secondary | ICD-10-CM | POA: Insufficient documentation

## 2012-02-12 DIAGNOSIS — J45909 Unspecified asthma, uncomplicated: Secondary | ICD-10-CM | POA: Insufficient documentation

## 2012-02-12 DIAGNOSIS — I5022 Chronic systolic (congestive) heart failure: Secondary | ICD-10-CM | POA: Insufficient documentation

## 2012-02-12 DIAGNOSIS — E785 Hyperlipidemia, unspecified: Secondary | ICD-10-CM | POA: Insufficient documentation

## 2012-02-12 DIAGNOSIS — I10 Essential (primary) hypertension: Secondary | ICD-10-CM | POA: Insufficient documentation

## 2012-02-12 DIAGNOSIS — Z853 Personal history of malignant neoplasm of breast: Secondary | ICD-10-CM | POA: Insufficient documentation

## 2012-02-12 DIAGNOSIS — F329 Major depressive disorder, single episode, unspecified: Secondary | ICD-10-CM | POA: Insufficient documentation

## 2012-02-12 DIAGNOSIS — M1990 Unspecified osteoarthritis, unspecified site: Secondary | ICD-10-CM | POA: Insufficient documentation

## 2012-02-12 DIAGNOSIS — E669 Obesity, unspecified: Secondary | ICD-10-CM | POA: Insufficient documentation

## 2012-02-12 DIAGNOSIS — G2581 Restless legs syndrome: Secondary | ICD-10-CM | POA: Insufficient documentation

## 2012-02-12 DIAGNOSIS — K589 Irritable bowel syndrome without diarrhea: Secondary | ICD-10-CM | POA: Insufficient documentation

## 2012-02-12 DIAGNOSIS — G4733 Obstructive sleep apnea (adult) (pediatric): Secondary | ICD-10-CM | POA: Insufficient documentation

## 2012-02-12 LAB — BASIC METABOLIC PANEL
Calcium: 11.3 mg/dL — ABNORMAL HIGH (ref 8.4–10.5)
Creatinine, Ser: 1.07 mg/dL (ref 0.50–1.10)
GFR calc non Af Amer: 57 mL/min — ABNORMAL LOW (ref >90)
Glucose, Bld: 109 mg/dL — ABNORMAL HIGH (ref 70–99)
Sodium: 134 mEq/L — ABNORMAL LOW (ref 135–145)

## 2012-02-12 NOTE — Telephone Encounter (Signed)
Provided lab results. Instructed to take one additional KDUR today. She verbalized understanding.

## 2012-02-12 NOTE — Progress Notes (Signed)
Patient ID: [name], female   DOB: 07/22/1956, 56 y.o.   MRN: [id number] 56 year old female with history of breast cancer, obesity, HTN, diabetes, IBS, and OSA. She had chemo induced cardiomyopathy. Cardiac MRI in July 2010 EF 45%. Myoview in 10/11 EF 59%. No ischemia.   Admitted 08/2011 for ADHF after she had run out medications for 2 weeks. She responded well to IV diuretics. Discharge weight 226 . Discharged on Lasix 40 mg bid.   Echo 08/12/11: Left ventricle: mildly dilated, EF 20% to 25%. Diffuse hypokinesis. Doppler parameters are consistent with restrictive physiology, indicative ofdecreased left ventricular diastolic compliance and/or increased left atrial pressure. Left atrium was moderately dilated. Right atrium mildly dilated.  D/C from [facility] 7/5 due  RHC on 6/27 showed decompensated HF with low cardiac output consistent with cardiogenic shock.  RHC 6/27  RA = 20  RV = 55/13/22  PA =56/34 (44)  PCW = 28  Fick cardiac output/index = 3.1/1.5  PVR = 5.3 Woods  FA sat = 91%  PA sat = 42%, 48%  SVC sat = 46%  She returns for post hospitalization follow up. Feels great! Denies SOB/PND/Orthopnea/CP.Occasionally dizzy.  She is able to walk around [facility] multiple times without difficulty. Weight at home 209-210. She has take Metolazone 2.5 mg for the last 2 nights. Compliant with medication. Following fluid restriction and low salt.   ROS: All systems negative except as listed in HPI, PMH and Problem List.  Past Medical History  Diagnosis Date  . 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 DYSLIPIDEMIA 05/01/2009  . OBESITY 07/24/2007  . OBSTRUCTIVE SLEEP APNEA 07/24/2007    with CPAP compliance  . RESTLESS LEG SYNDROME 07/24/2007  . SYSTOLIC HEART FAILURE, CHRONIC 01/05/2009  . TACHYCARDIA 10/25/2008  . Palpitations 09/20/2010  . 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 Hypertension   . 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  Previous EF 25%. ECHO 6/08 EF 55% ECHO 5/10 EF 30-35% MRI 7/10 EF 45% Myoview 10/11 59% no ischemia  . History of breast cancer 2006    with return in 2008  . Osteoarthritis   . Anxiety   . Depression     followed by a psychiatrist  . Asthma   . GERD (gastroesophageal reflux disease)   . Gastroparesis   . IBS (irritable bowel syndrome)     Current Outpatient Prescriptions  Medication Sig Dispense Refill  . acetaminophen (TYLENOL ARTHRITIS PAIN) 650 MG CR tablet Take 1,300 mg by mouth every 8 (eight) hours as needed. pain      . albuterol (PROVENTIL HFA) 108 (90 BASE) MCG/ACT inhaler Inhale 2 puffs into the lungs every 6 (six) hours as needed. For shortness of breath      . atorvastatin (LIPITOR) 40 MG tablet Take 40 mg by mouth daily.        . busPIRone (BUSPAR) 30 MG tablet Take 30 mg by mouth 2 (two) times daily.       . carvedilol (COREG) 12.5 MG tablet Take 0.5 tablets (6.25 mg total) by mouth 2 (two) times daily with a meal.  60 tablet  11  . citalopram (CELEXA) 20 MG tablet 2 tablets by mouth once daily      . cyclobenzaprine (FLEXERIL) 10 MG tablet Take 10 mg by mouth 3 (three) times daily as needed. For pain      . digoxin (LANOXIN) 0.125 MG tablet Take 1 tablet (125 mcg  total) by mouth daily.  30 tablet  5  . diphenhydramine-acetaminophen (TYLENOL PM) 25-500 MG TABS Take 1 tablet by mouth at bedtime as needed. For insomnia      . furosemide (LASIX) 20 MG tablet Take 3 tablets (60 mg total) by mouth 2 (two) times daily.  150 tablet  6  . Glucosamine 500 MG CAPS Take 1 capsule by mouth daily.        . insulin lispro protamine-insulin lispro (HUMALOG 75/25) (75-25) 100 UNIT/ML SUSP 95 units with breakfast, and 95 units with the evening meal.      . Insulin Pen Needle (B-D ULTRAFINE III SHORT PEN) 31G X 8 MM MISC 18/day as directed dx 250.00  500 each  11  . levothyroxine (SYNTHROID, LEVOTHROID) 75 MCG tablet Take 75 mcg by mouth daily.        Marland Kitchen loperamide (IMODIUM A-D) 2 MG tablet  Take 2 mg by mouth as needed. diarrhea      . metolazone (ZAROXOLYN) 2.5 MG tablet Take 2.5 mg by mouth daily as needed. For fluid retention      . mometasone (NASONEX) 50 MCG/ACT nasal spray Place 2 sprays into the nose daily as needed. allergies      . nitroGLYCERIN (NITROSTAT) 0.4 MG SL tablet Place 0.4 mg under the tongue every 5 (five) minutes as needed. For chest pain      . omega-3 acid ethyl esters (LOVAZA) 1 G capsule Take 4 capsules by mouth daily.        . pantoprazole (PROTONIX) 40 MG tablet Take 40 mg by mouth 2 (two) times daily.        . potassium chloride SA (K-DUR,KLOR-CON) 20 MEQ tablet Take 60 mEq by mouth 2 (two) times daily.       . ramipril (ALTACE) 2.5 MG tablet Take 4 tablets (10 mg total) by mouth Nightly.      Marland Kitchen rOPINIRole (REQUIP) 1 MG tablet Take 1 mg by mouth at bedtime.        Marland Kitchen spironolactone (ALDACTONE) 25 MG tablet Take 1 tablet (25 mg total) by mouth daily.  30 tablet  3  . SUMAtriptan (IMITREX) 25 MG tablet Take 25 mg by mouth every 2 (two) hours as needed. For migraine      . tiotropium (SPIRIVA) 18 MCG inhalation capsule Place 18 mcg into inhaler and inhale daily.        . traMADol (ULTRAM) 50 MG tablet Take 50 mg by mouth every 6 (six) hours as needed. pain      . DISCONTD: insulin lispro protamine-insulin lispro (HUMALOG MIX 75/25 KWIKPEN) (75-25) 100 UNIT/ML SUSP Inject 250 Units into the skin 2 (two) times daily with a meal. (with first and last meals of the day).  180 mL  12     PHYSICAL EXAM: Filed Vitals:   02/12/12 1405  BP: 92/60  Pulse: 105  Weight: 212 lb 8 oz (96.389 kg)  SpO2: 94%   General:  Obese, Well appearing. No resp difficulty Mom present HEENT: normal Neck: supple. JVP difficult to assess. Carotids 2+ bilaterally; no bruits. No lymphadenopathy or thryomegaly appreciated. Cor: PMI normal. Tachycardic Regular rate & rhythm. soft S3. Lungs: clear Abdomen: soft, obese, nontender, non distended. No hepatosplenomegaly. No bruits or  masses. Good bowel sounds. Extremities: no cyanosis, clubbing, rash, no edema. LUE PICC  Neuro: alert & orientedx3, cranial nerves grossly intact. Moves all 4 extremities w/o difficulty. Affect pleasant.    ASSESSMENT & PLAN:

## 2012-02-12 NOTE — Patient Instructions (Addendum)
Take extra Lasix 20 mg if your weight is 215 pounds or greater.   Do not take Metolazone  Follow up in 2 weeks.    Do the following things EVERYDAY: 1) Weigh yourself in the morning before breakfast. Write it down and keep it in a log. 2) Take your medicines as prescribed 3) Eat low salt foods-Limit salt (sodium) to 2000 mg per day.  4) Stay as active as you can everyday 5) Limit all fluids for the day to less than 2 liters

## 2012-02-12 NOTE — Assessment & Plan Note (Addendum)
She returns for follow and she is doing much better. Functionally she has improved and she is able to ambulate without dyspnea. Volume status stable. Weight stable. SBP soft. I have instructed her to stop taking Metolazone. She is instructed to take an additional Lasix if her weight increases to 215 pounds. Will instruct home health to remove PICC as she does not need home inotropes.  Repeat BMET. Follow up in 2 weeks.

## 2012-02-14 ENCOUNTER — Telehealth: Payer: Self-pay | Admitting: Internal Medicine

## 2012-02-14 ENCOUNTER — Telehealth (HOSPITAL_COMMUNITY): Payer: Self-pay | Admitting: *Deleted

## 2012-02-14 MED ORDER — RAMIPRIL 10 MG PO TABS: 10.0000 mg | ORAL_TABLET | Freq: Every day | ORAL | Status: DC

## 2012-02-14 NOTE — Telephone Encounter (Signed)
Veda Canning verbal order to d/c PICC line

## 2012-02-14 NOTE — Telephone Encounter (Signed)
Kathie Rhodes called in regards to Ms Eilers. She would like an order to d/c Ms Proctor's Picc line. Please call her back asap.  Thanks.

## 2012-02-14 NOTE — Telephone Encounter (Signed)
Left mess for pt ramipril 10 mg 1 tab nightly sent to pharmacy

## 2012-02-14 NOTE — Telephone Encounter (Signed)
[name],  Ms. Kue would like to know when you all are going to call in the script for her Ramipril to her drug store. Please call Ms. Neita Garnet after it has been called in. Thanks

## 2012-02-17 ENCOUNTER — Telehealth (HOSPITAL_COMMUNITY): Payer: Self-pay | Admitting: *Deleted

## 2012-02-17 NOTE — Telephone Encounter (Signed)
Pt's home health nurse Kathie Rhodes called to report pt's BP was 78/50, pt has been getting low readings at home for several days as well, pt is asymptomatic and her wt is down to 207, discussed w/Nicki Elige Radon, [place] pt will hold Carvedilol until SBP 90 or higher and Lasix until wt is up to 209 lb, she verbalizes understanding, also called and left Kathie Rhodes a VM about changes

## 2012-02-18 ENCOUNTER — Other Ambulatory Visit (HOSPITAL_BASED_OUTPATIENT_CLINIC_OR_DEPARTMENT_OTHER): Payer: Medicare Other

## 2012-02-18 DIAGNOSIS — Z171 Estrogen receptor negative status [ER-]: Secondary | ICD-10-CM

## 2012-02-18 DIAGNOSIS — C50919 Malignant neoplasm of unspecified site of unspecified female breast: Secondary | ICD-10-CM

## 2012-02-18 LAB — COMPREHENSIVE METABOLIC PANEL
ALT: 29 U/L (ref 0–35)
BUN: 19 mg/dL (ref 6–23)
Calcium: 10 mg/dL (ref 8.4–10.5)
Creatinine, Ser: 0.78 mg/dL (ref 0.50–1.10)
Glucose, Bld: 162 mg/dL — ABNORMAL HIGH (ref 70–99)
Potassium: 4.4 mEq/L (ref 3.5–5.3)
Sodium: 133 mEq/L — ABNORMAL LOW (ref 135–145)
Total Bilirubin: 0.9 mg/dL (ref 0.3–1.2)

## 2012-02-18 LAB — CBC WITH DIFFERENTIAL/PLATELET
BASO%: 0.6 % (ref 0.0–2.0)
EOS%: 1.8 % (ref 0.0–7.0)
Eosinophils Absolute: 0.2 10*3/uL (ref 0.0–0.5)
MCH: 28.1 pg (ref 25.1–34.0)
MCHC: 33 g/dL (ref 31.5–36.0)
MCV: 85.3 fL (ref 79.5–101.0)
MONO%: 7.1 % (ref 0.0–14.0)
NEUT#: 7.1 10*3/uL — ABNORMAL HIGH (ref 1.5–6.5)
RBC: 5.16 10*6/uL (ref 3.70–5.45)
RDW: 16.9 % — ABNORMAL HIGH (ref 11.2–14.5)

## 2012-02-19 LAB — VITAMIN D 25 HYDROXY (VIT D DEFICIENCY, FRACTURES): Vit D, 25-Hydroxy: 42 ng/mL (ref 30–89)

## 2012-02-19 LAB — CANCER ANTIGEN 27.29: CA 27.29: 12 U/mL (ref 0–39)

## 2012-02-25 ENCOUNTER — Ambulatory Visit (HOSPITAL_BASED_OUTPATIENT_CLINIC_OR_DEPARTMENT_OTHER): Payer: Medicare Other | Admitting: Oncology

## 2012-02-25 ENCOUNTER — Ambulatory Visit (HOSPITAL_COMMUNITY)
Admission: RE | Admit: 2012-02-25 | Discharge: 2012-02-25 | Disposition: A | Discharge status: Home / Self Care | Payer: Medicare Other | Source: Ambulatory Visit | Attending: Internal Medicine | Admitting: Internal Medicine

## 2012-02-25 VITALS — BP 90/40 | HR 120 | Wt 214.8 lb

## 2012-02-25 VITALS — BP 101/67 | HR 125 | Temp 98.1°F | Ht 65.0 in | Wt 214.5 lb

## 2012-02-25 DIAGNOSIS — Z853 Personal history of malignant neoplasm of breast: Secondary | ICD-10-CM

## 2012-02-25 DIAGNOSIS — I5022 Chronic systolic (congestive) heart failure: Secondary | ICD-10-CM | POA: Insufficient documentation

## 2012-02-25 DIAGNOSIS — C50919 Malignant neoplasm of unspecified site of unspecified female breast: Secondary | ICD-10-CM

## 2012-02-25 NOTE — Progress Notes (Signed)
Hematology and Oncology Follow Up Visit  [name] [id number] 1956-07-26 56 y.o. 02/25/2012 4:32 PM   DIAGNOSIS:   No diagnosis found.   PAST THERAPY:  PROBLEM:   1. 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   2. History of congestive heart failure.  3. History of diabetes.   4. History of hypertension. 5.   Interim History:  She was hospitalized a few weeks ago with heart failure, she was aggressively diuresed and is somewhat better but still has a fairly precarious cardiac situation. She is a large number medications. Her blood sugar control has not been especially good. Her last mammogram was in June of this year when was within normal limits.  Medications: I have reviewed the patient's current medications.  Allergies:  Allergies  Allergen Reactions  . Aspirin Hives  . Ciprofloxacin Hives  . Codeine Nausea Only  . Naproxen Hives  . Sulfonamide Derivatives Hives  . Tape Rash    Also allergic to metal    Past Medical History, Surgical history, Social history, and Family History were reviewed and updated.  Review of Systems: Constitutional:  Negative for fever, chills, night sweats, anorexia, weight loss, pain. Cardiovascular: no chest pain or dyspnea on exertion Respiratory: negative Neurological: negative Dermatological: negative ENT: negative Skin Gastrointestinal: negative Genito-Urinary: negative Hematological and Lymphatic: negative Breast: negative Musculoskeletal: negative Remaining ROS negative.  Physical Exam:  Blood pressure 101/67, pulse 125, temperature 98.1 F (36.7 C), height 5\' 5"  (1.651 m), weight 214 lb 8 oz (97.297 kg).  ECOG: 1-2  HEENT:  Sclerae anicteric, conjunctivae pink.  Oropharynx clear.  No mucositis or candidiasis.  Nodes:  No cervical, supraclavicular, or axillary lymphadenopathy  palpated.  Breast Exam:  Right breast is surgically absent  Left breast is benign.  No masses, discharge, skin change, or nipple inversion..  Lungs:  Clear to auscultation bilaterally.  No crackles, rhonchi, or wheezes.  Heart: Gallop rhythm area Abdomen:  Soft, nontender.  Positive bowel sounds.  No organomegaly or masses palpated.  Musculoskeletal:  No focal spinal tenderness to palpation.  Extremities:  Benign.  No peripheral edema or cyanosis.  Skin:  Benign.  Neuro:  Nonfocal.     Lab Results: Lab Results  Component Value Date   WBC 10.0 02/18/2012   HGB 14.5 02/18/2012   HCT 44.0 02/18/2012   MCV 85.3 02/18/2012   PLT 272 02/18/2012     Chemistry      Component Value Date/Time   NA 133* 02/18/2012 1558   K 4.4 02/18/2012 1558   CL 96 02/18/2012 1558   CO2 27 02/18/2012 1558   BUN 19 02/18/2012 1558   CREATININE 0.78 02/18/2012 1558      Component Value Date/Time   CALCIUM 10.0 02/18/2012 1558   ALKPHOS 83 02/18/2012 1558   AST 26 02/18/2012 1558   ALT 29 02/18/2012 1558   BILITOT 0.9 02/18/2012 1558       Radiological Studies:  No results found.   IMPRESSIONS AND PLAN: A 56 y.o. female with   History of triple-negative breast cancer status post mastectomy right breast status post TC chemotherapy last completed in 2008. History of significant congestive heart failure and cardiomyopathy with history of type 2 diabetes. From a breast cancer point of view I think she is doing well. We'll repeat a mammogram in the yearSpent more than half the time coordinating care, as  well as discussion of BMI and its implications.      [name] 7/23/20134:32 PM Cell [phone]

## 2012-02-25 NOTE — Assessment & Plan Note (Addendum)
Volume status stable. Continue current diuretic regimen. Reviewed home blood pressures. SBP has been >90. Restart carvedilol 6.25 mg at night. She is  instructeed to hold carvedilol if SBP<85. I have asked her to call HF clinic next week to report SBP pressure if SBP > 90 will add day time carvedilol 6.25 mg. Reinforced daily weights and low salt food choices. Follow up in 3 weeks.

## 2012-02-25 NOTE — Patient Instructions (Addendum)
Take Carvedilol 6. 25 mg at night. Hold if blood pressure is less than 85.   Follow up in 3 weeks  Do the following things EVERYDAY: 1) Weigh yourself in the morning before breakfast. Write it down and keep it in a log. 2) Take your medicines as prescribed 3) Eat low salt foods-Limit salt (sodium) to 2000 mg per day.  4) Stay as active as you can everyday 5) Limit all fluids for the day to less than 2 liters

## 2012-02-25 NOTE — Progress Notes (Signed)
Patient ID: [name], female   DOB: December 03, 1955, 56 y.o.   MRN: [id number] 56 year old female with history of breast cancer, obesity, HTN, diabetes, IBS, and OSA. She had chemo induced cardiomyopathy. Cardiac MRI in July 2010 EF 45%. Myoview in 10/11 EF 59%. No ischemia.   Admitted 08/2011 for ADHF after she had run out medications for 2 weeks. She responded well to IV diuretics. Discharge weight 226 . Discharged on Lasix 40 mg bid.   Echo 08/12/11: Left ventricle: mildly dilated, EF 20% to 25%. Diffuse hypokinesis. Doppler parameters are consistent with restrictive physiology, indicative ofdecreased left ventricular diastolic compliance and/or increased left atrial pressure. Left atrium was moderately dilated. Right atrium mildly dilated.  D/C from [facility] 7/5 Weight 213  RHC on 6/27 showed decompensated HF with low cardiac output consistent with cardiogenic shock.  RHC 6/27  RA = 20  RV = 55/13/22  PA =56/34 (44)  PCW = 28  Fick cardiac output/index = 3.1/1.5  PVR = 5.3 Woods  FA sat = 91%  PA sat = 42%, 48%  SVC sat = 46%  02/18/12 Potassium 4.4 Creatinine 0.78  She returns for follow up. Feels good. She has held carvedilol over the past 2 weeks due to SBP < 80. BP >90 over the last 2 weeks.  She also held lasix for 2 days.  Denies SOB/PND/Orthopnea/dizziness.  Weight at home 207-211 pounds.  Walking 40 minutes per day. Complaint with medications. Limiting fluid intake.     ROS: All systems negative except as listed in HPI, PMH and Problem List.  Past Medical History  Diagnosis Date  . 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 DYSLIPIDEMIA 05/01/2009  . OBESITY 07/24/2007  . OBSTRUCTIVE SLEEP APNEA 07/24/2007    with CPAP compliance  . RESTLESS LEG SYNDROME 07/24/2007  . SYSTOLIC HEART FAILURE, CHRONIC 01/05/2009  . TACHYCARDIA 10/25/2008  . Palpitations 09/20/2010  . 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 Hypertension   . CHF (congestive heart failure)     due to NICM (though secondary to chemo) cath  3/09 EF 45% minimal nonobstructive CAD. Previous EF 25%. ECHO 6/08 EF 55% ECHO 5/10 EF 30-35% MRI 7/10 EF 45% Myoview 10/11 59% no ischemia  . History of breast cancer 2006    with return in 2008  . Osteoarthritis   . Anxiety   . Depression     followed by a psychiatrist  . Asthma   . GERD (gastroesophageal reflux disease)   . Gastroparesis   . IBS (irritable bowel syndrome)     Current Outpatient Prescriptions  Medication Sig Dispense Refill  . acetaminophen (TYLENOL ARTHRITIS PAIN) 650 MG CR tablet Take 1,300 mg by mouth every 8 (eight) hours as needed. pain      . albuterol (PROVENTIL HFA) 108 (90 BASE) MCG/ACT inhaler Inhale 2 puffs into the lungs every 6 (six) hours as needed. For shortness of breath      . atorvastatin (LIPITOR) 40 MG tablet Take 40 mg by mouth daily.        . busPIRone (BUSPAR) 30 MG tablet Take 30 mg by mouth 2 (two) times daily.       . carvedilol (COREG) 12.5 MG tablet Take 0.5 tablets (6.25 mg total) by mouth 2 (two) times daily with a meal.  60 tablet  11  . citalopram (CELEXA) 20 MG tablet 2 tablets by mouth once daily      . cyclobenzaprine (FLEXERIL) 10 MG tablet Take 10 mg by mouth 3 (three) times daily as needed.  For pain      . digoxin (LANOXIN) 0.125 MG tablet Take 1 tablet (125 mcg total) by mouth daily.  30 tablet  5  . diphenhydramine-acetaminophen (TYLENOL PM) 25-500 MG TABS Take 1 tablet by mouth at bedtime as needed. For insomnia      . furosemide (LASIX) 20 MG tablet Take 3 tablets (60 mg total) by mouth 2 (two) times daily.  150 tablet  6  . Glucosamine 500 MG CAPS Take 1 capsule by mouth daily.        . insulin lispro protamine-insulin lispro (HUMALOG 75/25) (75-25) 100 UNIT/ML SUSP 95 units with breakfast, and 95 units with the evening meal.      . Insulin Pen Needle (B-D ULTRAFINE III SHORT PEN) 31G X 8 MM MISC 18/day as directed dx 250.00  500 each  11  . levothyroxine (SYNTHROID, LEVOTHROID) 75 MCG tablet Take 75 mcg by mouth daily.          Marland Kitchen loperamide (IMODIUM A-D) 2 MG tablet Take 2 mg by mouth as needed. diarrhea      . metolazone (ZAROXOLYN) 2.5 MG tablet Take 2.5 mg by mouth daily as needed. For fluid retention      . mometasone (NASONEX) 50 MCG/ACT nasal spray Place 2 sprays into the nose daily as needed. allergies      . nitroGLYCERIN (NITROSTAT) 0.4 MG SL tablet Place 0.4 mg under the tongue every 5 (five) minutes as needed. For chest pain      . omega-3 acid ethyl esters (LOVAZA) 1 G capsule Take 4 capsules by mouth daily.        . pantoprazole (PROTONIX) 40 MG tablet Take 40 mg by mouth 2 (two) times daily.        . potassium chloride SA (K-DUR,KLOR-CON) 20 MEQ tablet Take 60 mEq by mouth 2 (two) times daily.       . ramipril (ALTACE) 10 MG tablet Take 1 tablet (10 mg total) by mouth at bedtime.  30 tablet  6  . rOPINIRole (REQUIP) 1 MG tablet Take 1 mg by mouth at bedtime.        Marland Kitchen spironolactone (ALDACTONE) 25 MG tablet Take 1 tablet (25 mg total) by mouth daily.  30 tablet  3  . SUMAtriptan (IMITREX) 25 MG tablet Take 25 mg by mouth every 2 (two) hours as needed. For migraine      . tiotropium (SPIRIVA) 18 MCG inhalation capsule Place 18 mcg into inhaler and inhale daily.        . traMADol (ULTRAM) 50 MG tablet Take 50 mg by mouth every 6 (six) hours as needed. pain      . DISCONTD: insulin lispro protamine-insulin lispro (HUMALOG MIX 75/25 KWIKPEN) (75-25) 100 UNIT/ML SUSP Inject 250 Units into the skin 2 (two) times daily with a meal. (with first and last meals of the day).  180 mL  12     PHYSICAL EXAM: Filed Vitals:   02/25/12 1152  BP: 90/40  Pulse: 120  Weight: 214 lb 12 oz (97.41 kg)  SpO2: 94%   214 (212 pounds) General:  Obese, Well appearing. No resp difficulty HEENT: normal Neck: supple. JVP difficult to assess. Carotids 2+ bilaterally; no bruits. No lymphadenopathy or thryomegaly appreciated. Cor: PMI normal. Tachycardic Regular rate & rhythm. soft S3. Lungs: clear Abdomen: soft, obese,  nontender, non distended. No hepatosplenomegaly. No bruits or masses. Good bowel sounds. Extremities: no cyanosis, clubbing, rash, no edema.  Neuro: alert & orientedx3, cranial nerves grossly  intact. Moves all 4 extremities w/o difficulty. Affect pleasant.    ASSESSMENT & PLAN:

## 2012-02-28 ENCOUNTER — Telehealth (HOSPITAL_COMMUNITY): Payer: Self-pay | Admitting: *Deleted

## 2012-02-28 NOTE — Telephone Encounter (Signed)
Patient called, Blood Pressure pretty good ranges from  97/64 to 104/64, will start taking Coreg tonight and in the morning and will call on Monday to give update.

## 2012-03-10 ENCOUNTER — Ambulatory Visit (HOSPITAL_COMMUNITY)
Admission: RE | Admit: 2012-03-10 | Discharge: 2012-03-10 | Disposition: A | Discharge status: Home / Self Care | Payer: Medicare Other | Source: Ambulatory Visit | Attending: Internal Medicine | Admitting: Internal Medicine

## 2012-03-10 VITALS — BP 80/48 | HR 106 | Wt 213.2 lb

## 2012-03-10 DIAGNOSIS — I5022 Chronic systolic (congestive) heart failure: Secondary | ICD-10-CM | POA: Insufficient documentation

## 2012-03-10 NOTE — Progress Notes (Signed)
Patient ID: [name], female   DOB: Jan 08, 1956, 56 y.o.   MRN: [id number] HPI 56 year old female with history of breast cancer, obesity, HTN, diabetes, IBS, and OSA. She had chemo induced cardiomyopathy. Cardiac MRI in July 2010 EF 45%. Myoview in 10/11 EF 59%. No ischemia.   Admitted 08/2011 for ADHF after she had run out medications for 2 weeks. She responded well to IV diuretics. Discharge weight 226 . Discharged on Lasix 40 mg bid.   Echo 08/12/11: Left ventricle: mildly dilated, EF 20% to 25%. Diffuse hypokinesis. Doppler parameters are consistent with restrictive physiology, indicative ofdecreased left ventricular diastolic compliance and/or increased left atrial pressure. Left atrium was moderately dilated. Right atrium mildly dilated.  D/C from [facility] 7/5 Weight 213  RHC on 6/27 showed decompensated HF with low cardiac output consistent with cardiogenic shock.  RHC 6/27  RA = 20  RV = 55/13/22  PA =56/34 (44)  PCW = 28  Fick cardiac output/index = 3.1/1.5  PVR = 5.3 Woods  FA sat = 91%  PA sat = 42%, 48%  SVC sat = 46%  02/18/12 Potassium 4.4 Creatinine 0.78  She returns for follow up. Last visit carvedilol increased 6.25 mg twice a day. Feels great. Denies SOB/PND/Orthopnea/dizziness. Weight at home 209-212 pounds. SBP at home 90-110. Limiting fluid intake. Complaint with medications. Able to walk around [facility] when shopping. Able to 1/8 of a mile. Plans to follow up with Dr Everardo All for elevated glucose.      ROS: All systems negative except as listed in HPI, PMH and Problem List.  Past Medical History  Diagnosis Date  . 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 DYSLIPIDEMIA 05/01/2009  . OBESITY 07/24/2007  . OBSTRUCTIVE SLEEP APNEA 07/24/2007    with CPAP compliance  . RESTLESS LEG SYNDROME 07/24/2007  . SYSTOLIC HEART FAILURE, CHRONIC 01/05/2009  . TACHYCARDIA 10/25/2008  . Palpitations 09/20/2010  . 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 Hypertension   . CHF (congestive heart failure)    due to NICM (though secondary to chemo) cath 3/09 EF 45% minimal nonobstructive CAD. Previous EF 25%. ECHO 6/08 EF 55% ECHO 5/10 EF 30-35% MRI 7/10 EF 45% Myoview 10/11 59% no ischemia  . History of breast cancer 2006    with return in 2008  . Osteoarthritis   . Anxiety   . Depression     followed by a psychiatrist  . Asthma   . GERD (gastroesophageal reflux disease)   . Gastroparesis   . IBS (irritable bowel syndrome)     Current Outpatient Prescriptions  Medication Sig Dispense Refill  . acetaminophen (TYLENOL ARTHRITIS PAIN) 650 MG CR tablet Take 1,300 mg by mouth every 8 (eight) hours as needed. pain      . albuterol (PROVENTIL HFA) 108 (90 BASE) MCG/ACT inhaler Inhale 2 puffs into the lungs every 6 (six) hours as needed. For shortness of breath      . atorvastatin (LIPITOR) 40 MG tablet Take 40 mg by mouth daily.        . busPIRone (BUSPAR) 30 MG tablet Take 30 mg by mouth 2 (two) times daily.       . carvedilol (COREG) 12.5 MG tablet Take 0.5 tablets (6.25 mg total) by mouth 2 (two) times daily with a meal.  60 tablet  11  . citalopram (CELEXA) 20 MG tablet 2 tablets by mouth once daily      . cyclobenzaprine (FLEXERIL) 10 MG tablet Take 10 mg by mouth 3 (three) times daily as needed. For  pain      . digoxin (LANOXIN) 0.125 MG tablet Take 1 tablet (125 mcg total) by mouth daily.  30 tablet  5  . diphenhydramine-acetaminophen (TYLENOL PM) 25-500 MG TABS Take 1 tablet by mouth at bedtime as needed. For insomnia      . furosemide (LASIX) 20 MG tablet Take 3 tablets (60 mg total) by mouth 2 (two) times daily.  150 tablet  6  . Glucosamine 500 MG CAPS Take 1 capsule by mouth daily.        . insulin lispro protamine-insulin lispro (HUMALOG 75/25) (75-25) 100 UNIT/ML SUSP 95 units with breakfast, and 95 units with the evening meal.      . Insulin Pen Needle (B-D ULTRAFINE III SHORT PEN) 31G X 8 MM MISC 18/day as directed dx 250.00  500 each  11  . levothyroxine (SYNTHROID, LEVOTHROID) 75  MCG tablet Take 75 mcg by mouth daily.        Marland Kitchen loperamide (IMODIUM A-D) 2 MG tablet Take 2 mg by mouth as needed. diarrhea      . metolazone (ZAROXOLYN) 2.5 MG tablet Take 2.5 mg by mouth daily as needed. For fluid retention      . mometasone (NASONEX) 50 MCG/ACT nasal spray Place 2 sprays into the nose daily as needed. allergies      . nitroGLYCERIN (NITROSTAT) 0.4 MG SL tablet Place 0.4 mg under the tongue every 5 (five) minutes as needed. For chest pain      . omega-3 acid ethyl esters (LOVAZA) 1 G capsule Take 4 capsules by mouth daily.        . pantoprazole (PROTONIX) 40 MG tablet Take 40 mg by mouth 2 (two) times daily.        . potassium chloride SA (K-DUR,KLOR-CON) 20 MEQ tablet Take 60 mEq by mouth 2 (two) times daily.       . ramipril (ALTACE) 10 MG tablet Take 1 tablet (10 mg total) by mouth at bedtime.  30 tablet  6  . rOPINIRole (REQUIP) 1 MG tablet Take 1 mg by mouth at bedtime.        Marland Kitchen spironolactone (ALDACTONE) 25 MG tablet Take 1 tablet (25 mg total) by mouth daily.  30 tablet  3  . SUMAtriptan (IMITREX) 25 MG tablet Take 25 mg by mouth every 2 (two) hours as needed. For migraine      . tiotropium (SPIRIVA) 18 MCG inhalation capsule Place 18 mcg into inhaler and inhale daily.        . traMADol (ULTRAM) 50 MG tablet Take 50 mg by mouth every 6 (six) hours as needed. pain      . DISCONTD: insulin lispro protamine-insulin lispro (HUMALOG MIX 75/25 KWIKPEN) (75-25) 100 UNIT/ML SUSP Inject 250 Units into the skin 2 (two) times daily with a meal. (with first and last meals of the day).  180 mL  12     PHYSICAL EXAM: Filed Vitals:   03/10/12 1135  BP: 80/48  Pulse: 106  Weight: 213 lb 4 oz (96.73 kg)  SpO2: 97%   213 (214 pounds) General:  Obese, Well appearing. No resp difficulty HEENT: normal Neck: supple. JVP 6-7. Carotids 2+ bilaterally; no bruits. No lymphadenopathy or thryomegaly appreciated. Cor: PMI normal. Tachycardic Regular rate & rhythm. soft S3. Lungs:  clear Abdomen: soft, obese, nontender, non distended. No hepatosplenomegaly. No bruits or masses. Good bowel sounds. Extremities: no cyanosis, clubbing, rash, no edema.  Neuro: alert & orientedx3, cranial nerves grossly intact. Moves all 4  extremities w/o difficulty. Affect pleasant.    ASSESSMENT & PLAN:

## 2012-03-10 NOTE — Patient Instructions (Addendum)
Follow up in 1 month with an ECHO  Do the following things EVERYDAY: 1) Weigh yourself in the morning before breakfast. Write it down and keep it in a log. 2) Take your medicines as prescribed 3) Eat low salt foods-Limit salt (sodium) to 2000 mg per day.  4) Stay as active as you can everyday 5) Limit all fluids for the day to less than 2 liters 

## 2012-03-10 NOTE — Assessment & Plan Note (Signed)
Functionally she is improving and she is able to ambulate 1/8 of mile without stopping. Volume status stable. Weight stable. Unable to titrate medications due to soft SBP. Follow up in 1 month with an ECHO.

## 2012-03-12 ENCOUNTER — Telehealth: Payer: Self-pay | Admitting: Physician Assistant

## 2012-03-12 NOTE — Telephone Encounter (Signed)
Pt called this evening reporting cold symptoms and sore throat which she feels is related to allergies/sinus drainage, wanted to know what she could take for this that wouldn't hurt her heart. I advised she visit the pharmacy and try the Coricidin brand of medications (may use the formulation with chlorpheniramine for her allergies, dextromethorphan for her cough). I also advised lozenges for her throat as well as PRN tylenol as directed. I told her to avoid Sudafed (pseudoephedrine and phenylephrine) products. I also told her that the pharmacist at her drug store may be able to additionally help select some OTC meds. She verbalized understanding and gratitude.

## 2012-03-13 ENCOUNTER — Ambulatory Visit (INDEPENDENT_AMBULATORY_CARE_PROVIDER_SITE_OTHER): Payer: Medicare Other | Admitting: Endocrinology

## 2012-03-13 ENCOUNTER — Encounter: Payer: Self-pay | Admitting: Endocrinology

## 2012-03-13 VITALS — BP 92/58 | HR 125 | Temp 97.1°F | Ht 65.0 in | Wt 212.0 lb

## 2012-03-13 DIAGNOSIS — E119 Type 2 diabetes mellitus without complications: Secondary | ICD-10-CM

## 2012-03-13 NOTE — Progress Notes (Signed)
Subjective:    Patient ID: Joanne RODELL, female    DOB: 09/01/1955, 56 y.o.   MRN: [id number]  HPI Pt returns for f/u of insulin-requiring DM (dx'ed 2007; complicated by peripheral sensory neuropathy).  no cbg record, but states cbg's vary from 165-240.  There is no trend throughout the day.  She has lost weight, due to a recent hospitalization for CHF.  She has occasional mild hypoglycemia after the evening meal.   Past Medical History  Diagnosis Date  . 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 DYSLIPIDEMIA 05/01/2009  . OBESITY 07/24/2007  . OBSTRUCTIVE SLEEP APNEA 07/24/2007    with CPAP compliance  . RESTLESS LEG SYNDROME 07/24/2007  . SYSTOLIC HEART FAILURE, CHRONIC 01/05/2009  . TACHYCARDIA 10/25/2008  . Palpitations 09/20/2010  . 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 Hypertension   . 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 Previous EF 25%. ECHO 6/08 EF 55% ECHO 5/10 EF 30-35% MRI 7/10 EF 45% Myoview 10/11 59% no ischemia  . History of breast cancer 2006    with return in 2008  . Osteoarthritis   . Anxiety   . Depression     followed by a psychiatrist  . Asthma   . GERD (gastroesophageal reflux disease)   . Gastroparesis   . IBS (irritable bowel syndrome)     Past Surgical History  Procedure Date  . Mastectomy     right  . Abdominal hysterectomy 1991    History   Social History  . Marital Status: Divorced    Spouse Name: N/A    Number of Children: N/A  . Years of Education: N/A   Occupational History  . Disabled    Social History Main Topics  . Smoking status: Former Games developer  . Smokeless tobacco: Former Neurosurgeon    Quit date: 03/05/2001  . Alcohol Use: No  . Drug Use: No  . Sexually Active: Not on file   Other Topics Concern  . Not on file   Social History Narrative   Lives with mother and brotherRegular exercise-no    Current Outpatient Prescriptions on File Prior to Visit  Medication Sig  Dispense Refill  . acetaminophen (TYLENOL ARTHRITIS PAIN) 650 MG CR tablet Take 1,300 mg by mouth every 8 (eight) hours as needed. pain      . albuterol (PROVENTIL HFA) 108 (90 BASE) MCG/ACT inhaler Inhale 2 puffs into the lungs every 6 (six) hours as needed. For shortness of breath      . atorvastatin (LIPITOR) 40 MG tablet Take 40 mg by mouth daily.        . busPIRone (BUSPAR) 30 MG tablet Take 30 mg by mouth 2 (two) times daily.       . carvedilol (COREG) 12.5 MG tablet Take 0.5 tablets (6.25 mg total) by mouth 2 (two) times daily with a meal.  60 tablet  11  . citalopram (CELEXA) 20 MG tablet 2 tablets by mouth once daily      . cyclobenzaprine (FLEXERIL) 10 MG tablet Take 10 mg by mouth 3 (three) times daily as needed. For pain      . digoxin (LANOXIN) 0.125 MG tablet Take 1 tablet (125 mcg total) by mouth daily.  30 tablet  5  . diphenhydramine-acetaminophen (TYLENOL PM) 25-500 MG TABS Take 1 tablet by mouth at bedtime as needed. For insomnia      . furosemide (LASIX) 20 MG tablet Take 3 tablets (60 mg  total) by mouth 2 (two) times daily.  150 tablet  6  . Glucosamine 500 MG CAPS Take 1 capsule by mouth daily.        . insulin lispro protamine-insulin lispro (HUMALOG 75/25) (75-25) 100 UNIT/ML SUSP 95 units with breakfast, and 95 units with the evening meal.      . Insulin Pen Needle (B-D ULTRAFINE III SHORT PEN) 31G X 8 MM MISC 18/day as directed dx 250.00  500 each  11  . levothyroxine (SYNTHROID, LEVOTHROID) 75 MCG tablet Take 75 mcg by mouth daily.        Marland Kitchen loperamide (IMODIUM A-D) 2 MG tablet Take 2 mg by mouth as needed. diarrhea      . metolazone (ZAROXOLYN) 2.5 MG tablet Take 2.5 mg by mouth daily as needed. For fluid retention      . mometasone (NASONEX) 50 MCG/ACT nasal spray Place 2 sprays into the nose daily as needed. allergies      . nitroGLYCERIN (NITROSTAT) 0.4 MG SL tablet Place 0.4 mg under the tongue every 5 (five) minutes as needed. For chest pain      . omega-3 acid  ethyl esters (LOVAZA) 1 G capsule Take 4 capsules by mouth daily.        . pantoprazole (PROTONIX) 40 MG tablet Take 40 mg by mouth 2 (two) times daily.        . potassium chloride SA (K-DUR,KLOR-CON) 20 MEQ tablet Take 60 mEq by mouth 2 (two) times daily.       . ramipril (ALTACE) 10 MG tablet Take 1 tablet (10 mg total) by mouth at bedtime.  30 tablet  6  . rOPINIRole (REQUIP) 1 MG tablet Take 1 mg by mouth at bedtime.        Marland Kitchen spironolactone (ALDACTONE) 25 MG tablet Take 1 tablet (25 mg total) by mouth daily.  30 tablet  3  . SUMAtriptan (IMITREX) 25 MG tablet Take 25 mg by mouth every 2 (two) hours as needed. For migraine      . tiotropium (SPIRIVA) 18 MCG inhalation capsule Place 18 mcg into inhaler and inhale daily.        . traMADol (ULTRAM) 50 MG tablet Take 50 mg by mouth every 6 (six) hours as needed. pain      . DISCONTD: insulin lispro protamine-insulin lispro (HUMALOG MIX 75/25 KWIKPEN) (75-25) 100 UNIT/ML SUSP Inject 250 Units into the skin 2 (two) times daily with a meal. (with first and last meals of the day).  180 mL  12    Allergies  Allergen Reactions  . Aspirin Hives  . Ciprofloxacin Hives  . Codeine Nausea Only  . Naproxen Hives  . Sulfonamide Derivatives Hives  . Tape Rash    Also allergic to metal    Family History  Problem Relation Age of Onset  . Diabetes Mother   . Hypertension Other   . Coronary artery disease Father 61    BP 92/58  Pulse 125  Temp 97.1 F (36.2 C) (Oral)  Ht 5\' 5"  (1.651 m)  Wt 212 lb (96.163 kg)  BMI 35.28 kg/m2  SpO2 93%  Review of Systems Denies LOC    Objective:   Physical Exam VITAL SIGNS:  See vs page GENERAL: no distress Pulses: dorsalis pedis intact bilat.   Feet: no deformity.  no ulcer on the feet.  feet are of normal color and temp.  no edema.  There is bilateral onychomycosis.   Neuro: sensation is intact to touch on  the feet, but decreased from normal.     Assessment & Plan:  DM.  She apparently needs  increased rx

## 2012-03-13 NOTE — Patient Instructions (Addendum)
continue humalog 75/25 at 90 units 2x a day (with 1st and last meals of the day).   Please make a follow-up appointment in 3 months.   check your blood sugar 2 times a day.  vary the time of day when you check, between before the 3 meals, and at bedtime.  also check if you have symptoms of your blood sugar being too high or too low.  please keep a record of the readings and bring it to your next appointment here.  please call us sooner if you are having low blood sugar episodes, or if it stays over 200.   i'll let you know when i receive the a1c result from dr Tomasa Blase.

## 2012-03-24 ENCOUNTER — Telehealth: Payer: Self-pay | Admitting: *Deleted

## 2012-03-24 MED ORDER — INSULIN LISPRO PROT & LISPRO (75-25 MIX) 100 UNIT/ML ~~LOC~~ SUSP: SUBCUTANEOUS | Status: DC

## 2012-03-24 NOTE — Telephone Encounter (Signed)
R'cd results of Hemoglobin A1c from PCP's office (8.9)-per SAE, pt is to increase insulin to 100 units BID. Pt informed of MD's advisement.

## 2012-03-25 ENCOUNTER — Telehealth (HOSPITAL_COMMUNITY): Payer: Self-pay | Admitting: Cardiology

## 2012-03-25 NOTE — Telephone Encounter (Signed)
Left message to call back  

## 2012-03-25 NOTE — Telephone Encounter (Signed)
Till unable to reach pt Left message to call back

## 2012-03-25 NOTE — Telephone Encounter (Signed)
pts weight has increased x 3lbs overnight. Pt would like to know if she should take an extra dose? She has only taken her routine doses of medication at this time Please advise.  Thank

## 2012-03-25 NOTE — Telephone Encounter (Signed)
Left message to take extra Lasix tonight and call us back tomorrow with update

## 2012-03-26 NOTE — Telephone Encounter (Signed)
Left message to call back  

## 2012-03-27 DIAGNOSIS — I509 Heart failure, unspecified: Secondary | ICD-10-CM

## 2012-03-30 ENCOUNTER — Telehealth (HOSPITAL_COMMUNITY): Payer: Self-pay | Admitting: Cardiology

## 2012-03-30 NOTE — Telephone Encounter (Signed)
Opened in error

## 2012-03-31 NOTE — Telephone Encounter (Signed)
Still have been unable to reach pt, left mess to call back if wt not down or if not feeling well

## 2012-04-09 ENCOUNTER — Ambulatory Visit (HOSPITAL_BASED_OUTPATIENT_CLINIC_OR_DEPARTMENT_OTHER)
Admission: RE | Admit: 2012-04-09 | Discharge: 2012-04-09 | Disposition: A | Discharge status: Home / Self Care | Payer: Medicare Other | Source: Ambulatory Visit | Attending: Internal Medicine | Admitting: Internal Medicine

## 2012-04-09 ENCOUNTER — Ambulatory Visit (HOSPITAL_COMMUNITY)
Admission: RE | Admit: 2012-04-09 | Discharge: 2012-04-09 | Disposition: A | Discharge status: Home / Self Care | Payer: Medicare Other | Source: Ambulatory Visit | Attending: Internal Medicine | Admitting: Internal Medicine

## 2012-04-09 ENCOUNTER — Encounter (HOSPITAL_COMMUNITY): Payer: Self-pay

## 2012-04-09 VITALS — BP 110/58 | HR 103 | Resp 20 | Ht 65.0 in | Wt 215.8 lb

## 2012-04-09 DIAGNOSIS — I509 Heart failure, unspecified: Secondary | ICD-10-CM | POA: Insufficient documentation

## 2012-04-09 DIAGNOSIS — I5022 Chronic systolic (congestive) heart failure: Secondary | ICD-10-CM

## 2012-04-09 DIAGNOSIS — I059 Rheumatic mitral valve disease, unspecified: Secondary | ICD-10-CM | POA: Insufficient documentation

## 2012-04-09 DIAGNOSIS — I369 Nonrheumatic tricuspid valve disorder, unspecified: Secondary | ICD-10-CM | POA: Insufficient documentation

## 2012-04-09 NOTE — Progress Notes (Signed)
  Echocardiogram 2D Echocardiogram has been performed.  [name] 04/09/2012, 12:08 PM

## 2012-04-09 NOTE — Assessment & Plan Note (Addendum)
NYHA II. Functionally she has improved as she is able ambulate without dyspnea. Volume status stable. Continue current diuretic regimen. Ace inhibitor at goal. Unable to titrate beta blocker due to reduced heart at home (50s). ECHO results reviewed. Mild improvement noted. EF now 30-35% from previous 20-25%. Will try to titrate beta blocker at next visit. Follow up in 2 months

## 2012-04-09 NOTE — Progress Notes (Signed)
Patient ID: [name], female   DOB: Sep 15, 1955, 57 y.o.   MRN: [id number] PCP: Dr Everardo All  HPI 57 year old female with history of breast cancer, obesity, HTN, diabetes, IBS, and OSA. She had chemo induced cardiomyopathy. Cardiac MRI in July 2010 EF 45%. Myoview in 10/11 EF 59%. No ischemia.   Admitted 08/2011 for ADHF after she had run out medications for 2 weeks. She responded well to IV diuretics. Discharge weight 226 . Discharged on Lasix 40 mg bid.   Echo 08/12/11: Left ventricle: mildly dilated, EF 20% to 25%. Diffuse hypokinesis. Doppler parameters are consistent with restrictive physiology, indicative of decreased left ventricular diastolic compliance and/or increased left atrial pressure. Left atrium was moderately dilated. Right atrium mildly dilated.  D/C from [facility] 02/07/12 D/C Weight 213 pounds RHC on 6/27 showed decompensated HF with low cardiac output consistent with cardiogenic shock.  RHC 6/27  RA = 20  RV = 55/13/22  PA =56/34 (44)  PCW = 28  Fick cardiac output/index = 3.1/1.5  PVR = 5.3 Woods  FA sat = 91%  PA sat = 42%, 48%  SVC sat = 46%   08/2011 ECHO EF 20-25% LV moderately dilated 04/09/12 ECHO EF 30-35% LV mildly dilated  Hyopkinesis  She returns for follow up with her Mom.  Feels great. Denies SOB/PND/Orthopnea. Able to walk without dyspnea. Weight at home 210-213 pounds. SBP at home 90-110. HR 50-60s. She has required one extra lasix.  Compliant with medications. She is limiting her fluid intake to < 2 liters per day. Denies lower extremity edema.    ROS: All systems negative except as listed in HPI, PMH and Problem List.  Past Medical History  Diagnosis Date  . 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 DYSLIPIDEMIA 05/01/2009  . OBESITY 07/24/2007  . OBSTRUCTIVE SLEEP APNEA 07/24/2007    with CPAP compliance  . RESTLESS LEG SYNDROME 07/24/2007  . SYSTOLIC HEART FAILURE, CHRONIC 01/05/2009  . TACHYCARDIA 10/25/2008  . Palpitations 09/20/2010  . HYPOTHYROIDISM-IATROGENIC 08/08/2008    . Hypertension   . 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 Previous EF 25%. ECHO 6/08 EF 55% ECHO 5/10 EF 30-35% MRI 7/10 EF 45% Myoview 10/11 59% no ischemia  . History of breast cancer 2006    with return in 2008  . Osteoarthritis   . Anxiety   . Depression     followed by a psychiatrist  . Asthma   . GERD (gastroesophageal reflux disease)   . Gastroparesis   . IBS (irritable bowel syndrome)     Current Outpatient Prescriptions  Medication Sig Dispense Refill  . acetaminophen (TYLENOL ARTHRITIS PAIN) 650 MG CR tablet Take 1,300 mg by mouth every 8 (eight) hours as needed. pain      . albuterol (PROVENTIL HFA) 108 (90 BASE) MCG/ACT inhaler Inhale 2 puffs into the lungs every 6 (six) hours as needed. For shortness of breath      . atorvastatin (LIPITOR) 40 MG tablet Take 40 mg by mouth daily.        . busPIRone (BUSPAR) 30 MG tablet Take 30 mg by mouth 2 (two) times daily.       . carvedilol (COREG) 12.5 MG tablet Take 0.5 tablets (6.25 mg total) by mouth 2 (two) times daily with a meal.  60 tablet  11  . cefUROXime (CEFTIN) 500 MG tablet Take 500 mg by mouth 2 (two) times daily.      Marland Kitchen  Chlorpheniramine-DM (CORICIDIN HBP COUGH/COLD PO) 2 capsules by mouth every 4 hours as needed      . citalopram (CELEXA) 20 MG tablet 2 tablets by mouth once daily      . cyclobenzaprine (FLEXERIL) 10 MG tablet Take 10 mg by mouth 3 (three) times daily as needed. For pain      . digoxin (LANOXIN) 0.125 MG tablet Take 1 tablet (125 mcg total) by mouth daily.  30 tablet  5  . diphenhydramine-acetaminophen (TYLENOL PM) 25-500 MG TABS Take 1 tablet by mouth at bedtime as needed. For insomnia      . furosemide (LASIX) 20 MG tablet Take 3 tablets (60 mg total) by mouth 2 (two) times daily.  150 tablet  6  . Glucosamine 500 MG CAPS Take 1 capsule by mouth daily.        . insulin lispro protamine-insulin lispro (HUMALOG 75/25)  (75-25) 100 UNIT/ML SUSP 100 units with breakfast, and 100 units with the evening meal.  10 mL  0  . Insulin Pen Needle (B-D ULTRAFINE III SHORT PEN) 31G X 8 MM MISC 18/day as directed dx 250.00  500 each  11  . levothyroxine (SYNTHROID, LEVOTHROID) 75 MCG tablet Take 75 mcg by mouth daily.        Marland Kitchen loperamide (IMODIUM A-D) 2 MG tablet Take 2 mg by mouth as needed. diarrhea      . metolazone (ZAROXOLYN) 2.5 MG tablet Take 2.5 mg by mouth daily as needed. For fluid retention      . mometasone (NASONEX) 50 MCG/ACT nasal spray Place 2 sprays into the nose daily as needed. allergies      . nitroGLYCERIN (NITROSTAT) 0.4 MG SL tablet Place 0.4 mg under the tongue every 5 (five) minutes as needed. For chest pain      . omega-3 acid ethyl esters (LOVAZA) 1 G capsule Take 4 capsules by mouth daily.        . pantoprazole (PROTONIX) 40 MG tablet Take 40 mg by mouth 2 (two) times daily.        . potassium chloride SA (K-DUR,KLOR-CON) 20 MEQ tablet Take 60 mEq by mouth 2 (two) times daily.       . ramipril (ALTACE) 10 MG tablet Take 1 tablet (10 mg total) by mouth at bedtime.  30 tablet  6  . rOPINIRole (REQUIP) 1 MG tablet Take 1 mg by mouth at bedtime.        Marland Kitchen spironolactone (ALDACTONE) 25 MG tablet Take 1 tablet (25 mg total) by mouth daily.  30 tablet  3  . SUMAtriptan (IMITREX) 25 MG tablet Take 25 mg by mouth every 2 (two) hours as needed. For migraine      . tiotropium (SPIRIVA) 18 MCG inhalation capsule Place 18 mcg into inhaler and inhale daily.        . traMADol (ULTRAM) 50 MG tablet Take 50 mg by mouth every 6 (six) hours as needed. pain      . DISCONTD: insulin lispro protamine-insulin lispro (HUMALOG MIX 75/25 KWIKPEN) (75-25) 100 UNIT/ML SUSP Inject 250 Units into the skin 2 (two) times daily with a meal. (with first and last meals of the day).  180 mL  12     PHYSICAL EXAM: Filed Vitals:   04/09/12 1218  BP: 110/58  Pulse: 103  Resp: 20  Height: 5\' 5"  (1.651 m)  Weight: 215 lb 12.8 oz  (97.886 kg)  SpO2: 95%   215 (213) General:  Obese, Well appearing. No resp  difficulty Mom present HEENT: normal Neck: supple. JVP 6-7. Carotids 2+ bilaterally; no bruits. No lymphadenopathy or thryomegaly appreciated. Cor: PMI normal. Tachycardic Regular rate & rhythm. soft S3. Lungs: clear Abdomen: soft, obese, nontender, non distended. No hepatosplenomegaly. No bruits or masses. Good bowel sounds. Extremities: no cyanosis, clubbing, rash, no edema.  Neuro: alert & orientedx3, cranial nerves grossly intact. Moves all 4 extremities w/o difficulty. Affect pleasant.    ASSESSMENT & PLAN:

## 2012-04-09 NOTE — Patient Instructions (Addendum)
Follow up in 2 months  Do the following things EVERYDAY: 1) Weigh yourself in the morning before breakfast. Write it down and keep it in a log. 2) Take your medicines as prescribed 3) Eat low salt foods-Limit salt (sodium) to 2000 mg per day.  4) Stay as active as you can everyday 5) Limit all fluids for the day to less than 2 liters 

## 2012-04-14 ENCOUNTER — Other Ambulatory Visit: Payer: Self-pay | Admitting: Cardiology

## 2012-04-14 MED ORDER — SPIRONOLACTONE 25 MG PO TABS: 25.0000 mg | ORAL_TABLET | Freq: Every day | ORAL | Status: DC

## 2012-04-21 ENCOUNTER — Telehealth (HOSPITAL_COMMUNITY): Payer: Self-pay | Admitting: Cardiology

## 2012-04-21 MED ORDER — FUROSEMIDE 20 MG PO TABS: 60.0000 mg | ORAL_TABLET | Freq: Three times a day (TID) | ORAL | Status: DC

## 2012-04-21 NOTE — Telephone Encounter (Signed)
Pt called to request a new Rx of Lasix to reflect increase, pt is now 3 tabs TID.  [facility] [phone]

## 2012-04-22 ENCOUNTER — Other Ambulatory Visit (HOSPITAL_COMMUNITY): Payer: Self-pay | Admitting: *Deleted

## 2012-04-22 MED ORDER — FUROSEMIDE 20 MG PO TABS: 60.0000 mg | ORAL_TABLET | Freq: Three times a day (TID) | ORAL | Status: DC

## 2012-05-18 ENCOUNTER — Other Ambulatory Visit: Payer: Self-pay | Admitting: General Practice

## 2012-05-18 MED ORDER — INSULIN LISPRO PROT & LISPRO (75-25 MIX) 100 UNIT/ML ~~LOC~~ SUSP: SUBCUTANEOUS | Status: DC

## 2012-05-22 ENCOUNTER — Other Ambulatory Visit: Payer: Self-pay

## 2012-05-22 MED ORDER — SPIRONOLACTONE 25 MG PO TABS: 25.0000 mg | ORAL_TABLET | Freq: Every day | ORAL | Status: DC

## 2012-06-15 ENCOUNTER — Ambulatory Visit: Payer: Medicare Other | Admitting: Endocrinology

## 2012-06-15 ENCOUNTER — Ambulatory Visit (HOSPITAL_COMMUNITY)
Admission: RE | Admit: 2012-06-15 | Discharge: 2012-06-15 | Disposition: A | Discharge status: Home / Self Care | Payer: Medicare Other | Source: Ambulatory Visit | Attending: Internal Medicine | Admitting: Internal Medicine

## 2012-06-15 VITALS — BP 92/52 | HR 115 | Wt 217.5 lb

## 2012-06-15 DIAGNOSIS — I509 Heart failure, unspecified: Secondary | ICD-10-CM | POA: Insufficient documentation

## 2012-06-15 DIAGNOSIS — I10 Essential (primary) hypertension: Secondary | ICD-10-CM | POA: Insufficient documentation

## 2012-06-15 DIAGNOSIS — E119 Type 2 diabetes mellitus without complications: Secondary | ICD-10-CM | POA: Insufficient documentation

## 2012-06-15 DIAGNOSIS — E039 Hypothyroidism, unspecified: Secondary | ICD-10-CM | POA: Insufficient documentation

## 2012-06-15 DIAGNOSIS — E669 Obesity, unspecified: Secondary | ICD-10-CM | POA: Insufficient documentation

## 2012-06-15 DIAGNOSIS — G4733 Obstructive sleep apnea (adult) (pediatric): Secondary | ICD-10-CM | POA: Insufficient documentation

## 2012-06-15 DIAGNOSIS — I5022 Chronic systolic (congestive) heart failure: Secondary | ICD-10-CM

## 2012-06-15 DIAGNOSIS — Z853 Personal history of malignant neoplasm of breast: Secondary | ICD-10-CM | POA: Insufficient documentation

## 2012-06-15 DIAGNOSIS — I5030 Unspecified diastolic (congestive) heart failure: Secondary | ICD-10-CM | POA: Insufficient documentation

## 2012-06-15 DIAGNOSIS — R002 Palpitations: Secondary | ICD-10-CM

## 2012-06-15 DIAGNOSIS — K589 Irritable bowel syndrome without diarrhea: Secondary | ICD-10-CM | POA: Insufficient documentation

## 2012-06-15 LAB — BASIC METABOLIC PANEL
BUN: 16 mg/dL (ref 6–23)
CO2: 27 mEq/L (ref 19–32)
Calcium: 9.4 mg/dL (ref 8.4–10.5)
Creatinine, Ser: 0.71 mg/dL (ref 0.50–1.10)
Glucose, Bld: 294 mg/dL — ABNORMAL HIGH (ref 70–99)

## 2012-06-15 LAB — CBC
MCH: 29.5 pg (ref 26.0–34.0)
MCHC: 33.5 g/dL (ref 30.0–36.0)
MCV: 88.1 fL (ref 78.0–100.0)
Platelets: 281 10*3/uL (ref 150–400)
RDW: 15.6 % — ABNORMAL HIGH (ref 11.5–15.5)
WBC: 10.1 10*3/uL (ref 4.0–10.5)

## 2012-06-15 MED ORDER — CARVEDILOL 12.5 MG PO TABS: ORAL_TABLET | ORAL | Status: DC

## 2012-06-15 NOTE — Assessment & Plan Note (Signed)
From functional standpoint doing much better but remains quite tachycardic. Volume status seems ok. She is on good meds including full-dose ramipril. Will attempt to titrate carvedilol carefully to 6.25am/12.5pm. Check labs. See back 1 month.

## 2012-06-15 NOTE — Progress Notes (Signed)
Patient ID: [name], female   DOB: May 06, 1956, 56 y.o.   MRN: [id number] PCP: Dr Everardo All  HPI 56 year old female with history of breast cancer, obesity, HTN, diabetes, IBS, and OSA. She had chemo induced cardiomyopathy. Cardiac MRI in July 2010 EF 45%. Myoview in 10/11 EF 59%. No ischemia.   Admitted 08/2011 for ADHF after she had run out medications for 2 weeks. She responded well to IV diuretics. Discharge weight 226 . Discharged on Lasix 40 mg bid.   Echo 08/12/11: Left ventricle: mildly dilated, EF 20% to 25%. Diffuse hypokinesis. Doppler parameters are consistent with restrictive physiology, indicative of decreased left ventricular diastolic compliance and/or increased left atrial pressure. Left atrium was moderately dilated. Right atrium mildly dilated.  Admitted in June 2013 with cardiogenic shock. Required inotropes. Weight 213 pounds RHC on 6/27 showed decompensated HF with low cardiac output consistent with cardiogenic shock.  RHC 6/27  RA = 20  RV = 55/13/22  PA =56/34 (44)  PCW = 28  Fick cardiac output/index = 3.1/1.5  PVR = 5.3 Woods  FA sat = 91%  PA sat = 42%, 48%  SVC sat = 46%  04/09/12 ECHO EF 30-35% LV mildly dilated  Hyopkinesis  She returns for follow up. Watching her sodium and fluid intake very closely. Weight stable. For the most part feels she is doing very well. Denies SOB/PND/Orthopnea/edema. Able to walk without dyspnea. Weight at home ~210-215 pounds. Two times per week she parks at the end of the parking lot at [employer]. Walks into store and does 4-5 laps around entire store without stopping before she does her shopping. Compliant with medications. BP coming up 120/60s. HR usually 115.  ROS: All systems negative except as listed in HPI, PMH and Problem List.  Past Medical History  Diagnosis Date  . 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 DYSLIPIDEMIA 05/01/2009  . OBESITY 07/24/2007  . OBSTRUCTIVE SLEEP APNEA 07/24/2007    with CPAP compliance  . RESTLESS LEG SYNDROME  07/24/2007  . SYSTOLIC HEART FAILURE, CHRONIC 01/05/2009  . TACHYCARDIA 10/25/2008  . Palpitations 09/20/2010  . 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 Hypertension   . 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 Previous EF 25%. ECHO 6/08 EF 55% ECHO 5/10 EF 30-35% MRI 7/10 EF 45% Myoview 10/11 59% no ischemia  . History of breast cancer 2006    with return in 2008  . Osteoarthritis   . Anxiety   . Depression     followed by a psychiatrist  . Asthma   . GERD (gastroesophageal reflux disease)   . Gastroparesis   . IBS (irritable bowel syndrome)     Current Outpatient Prescriptions  Medication Sig Dispense Refill  . acetaminophen (TYLENOL ARTHRITIS PAIN) 650 MG CR tablet Take 1,300 mg by mouth every 8 (eight) hours as needed. pain      . albuterol (PROVENTIL HFA) 108 (90 BASE) MCG/ACT inhaler Inhale 2 puffs into the lungs every 6 (six) hours as needed. For shortness of breath      . atorvastatin (LIPITOR) 40 MG tablet Take 40 mg by mouth daily.        . busPIRone (BUSPAR) 30 MG tablet Take 30 mg by mouth 2 (two) times daily.       . carvedilol (COREG) 12.5 MG tablet Take 0.5 tablets (6.25 mg total) by mouth 2 (two) times daily with a meal.  60 tablet  11  .  Chlorpheniramine-DM (CORICIDIN HBP COUGH/COLD PO) 2 capsules by mouth every 4 hours as needed      . citalopram (CELEXA) 20 MG tablet Take 40 mg by mouth daily.       . cyclobenzaprine (FLEXERIL) 10 MG tablet Take 10 mg by mouth 3 (three) times daily as needed. For pain      . digoxin (LANOXIN) 0.125 MG tablet Take 1 tablet (125 mcg total) by mouth daily.  30 tablet  5  . diphenhydramine-acetaminophen (TYLENOL PM) 25-500 MG TABS Take 1 tablet by mouth at bedtime as needed. For insomnia      . furosemide (LASIX) 20 MG tablet Take 3 tablets (60 mg total) by mouth 3 (three) times daily.  180 tablet  6  . Glucosamine 500 MG CAPS Take 1 capsule by mouth daily.          . insulin lispro protamine-insulin lispro (HUMALOG 75/25) (75-25) 100 UNIT/ML SUSP 95 units with breakfast, and 95 units with the evening meal.      . levothyroxine (SYNTHROID, LEVOTHROID) 75 MCG tablet Take 75 mcg by mouth daily.        Marland Kitchen loperamide (IMODIUM A-D) 2 MG tablet Take 2 mg by mouth as needed. diarrhea      . mometasone (NASONEX) 50 MCG/ACT nasal spray Place 2 sprays into the nose daily as needed. allergies      . nitroGLYCERIN (NITROSTAT) 0.4 MG SL tablet Place 0.4 mg under the tongue every 5 (five) minutes as needed. For chest pain      . omega-3 acid ethyl esters (LOVAZA) 1 G capsule Take 4 capsules by mouth daily.        . pantoprazole (PROTONIX) 40 MG tablet Take 40 mg by mouth 2 (two) times daily.        . potassium chloride SA (K-DUR,KLOR-CON) 20 MEQ tablet Take 60 mEq by mouth 2 (two) times daily.       . ramipril (ALTACE) 10 MG tablet Take 1 tablet (10 mg total) by mouth at bedtime.  30 tablet  6  . rOPINIRole (REQUIP) 1 MG tablet Take 1 mg by mouth at bedtime.        Marland Kitchen spironolactone (ALDACTONE) 25 MG tablet Take 1 tablet (25 mg total) by mouth daily.  30 tablet  2  . SUMAtriptan (IMITREX) 25 MG tablet Take 25 mg by mouth every 2 (two) hours as needed. For migraine      . tiotropium (SPIRIVA) 18 MCG inhalation capsule Place 18 mcg into inhaler and inhale daily.        . traMADol (ULTRAM) 50 MG tablet Take 50 mg by mouth every 6 (six) hours as needed. pain      . [DISCONTINUED] insulin lispro protamine-insulin lispro (HUMALOG 75/25) (75-25) 100 UNIT/ML SUSP 100 units with breakfast, and 100 units with the evening meal.  10 mL  1  . Insulin Pen Needle (B-D ULTRAFINE III SHORT PEN) 31G X 8 MM MISC 18/day as directed dx 250.00  500 each  11  . metolazone (ZAROXOLYN) 2.5 MG tablet Take 2.5 mg by mouth daily as needed. For fluid retention         PHYSICAL EXAM: Filed Vitals:   06/15/12 1041  BP: 92/52  Pulse: 115  Weight: 217 lb 8 oz (98.657 kg)  SpO2: 94%   217  (215) General:  Obese, Well appearing. No resp difficulty HEENT: normal Neck: supple. JVP hard to see. Does not look overly elevated Carotids 2+ bilaterally; no bruits. No  lymphadenopathy or thryomegaly appreciated. Cor: PMI normal. Tachycardic Regular rate & rhythm. ? soft S3. Lungs: clear Abdomen: soft, obese, nontender, non distended. No hepatosplenomegaly. No bruits or masses. Good bowel sounds. Extremities: no cyanosis, clubbing, rash, tr edema.  Neuro: alert & orientedx3, cranial nerves grossly intact. Moves all 4 extremities w/o difficulty. Affect pleasant.  ECG: Sinus tach 107 iRBBB. Antero-septal Qs  ASSESSMENT & PLAN:

## 2012-06-15 NOTE — Assessment & Plan Note (Signed)
See below under CHF section.

## 2012-06-15 NOTE — Patient Instructions (Addendum)
Your physician recommends that you schedule a follow-up appointment in: 6 weeks  

## 2012-06-22 ENCOUNTER — Other Ambulatory Visit (HOSPITAL_COMMUNITY): Payer: Self-pay | Admitting: *Deleted

## 2012-06-22 MED ORDER — DIGOXIN 125 MCG PO TABS: 125.0000 ug | ORAL_TABLET | Freq: Every day | ORAL | Status: DC

## 2012-07-27 ENCOUNTER — Encounter (HOSPITAL_COMMUNITY): Payer: Self-pay

## 2012-07-27 ENCOUNTER — Ambulatory Visit (HOSPITAL_COMMUNITY)
Admission: RE | Admit: 2012-07-27 | Discharge: 2012-07-27 | Disposition: A | Discharge status: Home / Self Care | Payer: Medicare Other | Source: Ambulatory Visit | Attending: Internal Medicine | Admitting: Internal Medicine

## 2012-07-27 VITALS — BP 148/78 | HR 119 | Wt 218.8 lb

## 2012-07-27 DIAGNOSIS — I5022 Chronic systolic (congestive) heart failure: Secondary | ICD-10-CM | POA: Insufficient documentation

## 2012-07-27 LAB — BASIC METABOLIC PANEL
BUN: 16 mg/dL (ref 6–23)
CO2: 28 mEq/L (ref 19–32)
Calcium: 10.3 mg/dL (ref 8.4–10.5)
Chloride: 96 mEq/L (ref 96–112)
Creatinine, Ser: 0.69 mg/dL (ref 0.50–1.10)
Glucose, Bld: 340 mg/dL — ABNORMAL HIGH (ref 70–99)

## 2012-07-27 MED ORDER — CARVEDILOL 12.5 MG PO TABS: 12.5000 mg | ORAL_TABLET | Freq: Two times a day (BID) | ORAL | Status: DC

## 2012-07-27 NOTE — Addendum Note (Signed)
Encounter addended by: Theresia Bough, CMA on: 07/27/2012  9:53 AM<BR>     Documentation filed: Visit Diagnoses, Orders

## 2012-07-27 NOTE — Assessment & Plan Note (Addendum)
Doing fairly well. NYHA II-III. Volume status looks pretty good - is doing well with daily weights and sliding scale. However remains tachycardic for unclear reasons. TSH normal at last visit. Will increase carvedilol to 12.5 bid. F/u 4 weeks. Will check labs today to make sure renal function and potassium are stable on high-dose lasix. If tachycardia persists may need to repeat RHC to reassess for low output.

## 2012-07-27 NOTE — Patient Instructions (Addendum)
Labs today.  Return to clinic in 4 weeks.  Increase Coreg to 12.5 mg twice daily

## 2012-07-27 NOTE — Progress Notes (Signed)
PCP: Dr Everardo All   HPI 56 year old female with history of breast cancer, obesity, HTN, diabetes, IBS, and OSA. She had chemo induced cardiomyopathy. Cardiac MRI in July 2010 EF 45%. Myoview in 10/11 EF 59%. No ischemia.   Admitted 08/2011 for ADHF after she had run out medications for 2 weeks. She responded well to IV diuretics. Discharge weight 226 . Discharged on Lasix 40 mg bid.   Echo 08/12/11: Left ventricle: mildly dilated, EF 20% to 25%. Diffuse hypokinesis. Doppler parameters are consistent with restrictive physiology, indicative of decreased left ventricular diastolic compliance and/or increased left atrial pressure. Left atrium was moderately dilated. Right atrium mildly dilated.  Admitted in June 2013 with cardiogenic shock. Required inotropes. Weight 213 pounds RHC on 6/27 showed decompensated HF with low cardiac output consistent with cardiogenic shock.  RHC 6/27  RA = 20  RV = 55/13/22  PA =56/34 (44)  PCW = 28  Fick cardiac output/index = 3.1/1.5  PVR = 5.3 Woods  FA sat = 91%  PA sat = 42%, 48%  SVC sat = 46%  04/09/12 ECHO EF 30-35% LV mildly dilated  Hyopkinesis  Last month carvedilol titrated to 6.25/12.5. Tolerated well. Is persistently tachycardiac with HRs > 110. Weighing every day. Typically 210-214. Takes extra lasix when it goes up. Breathing "ok". Under a lot of stress. Mother has recurrent cancer and is struggling with chemo. Denies edema, orthopnea, PND.   ROS: All systems negative except as listed in HPI, PMH and Problem List.  Past Medical History  Diagnosis Date  . 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 DYSLIPIDEMIA 05/01/2009  . OBESITY 07/24/2007  . OBSTRUCTIVE SLEEP APNEA 07/24/2007    with CPAP compliance  . RESTLESS LEG SYNDROME 07/24/2007  . SYSTOLIC HEART FAILURE, CHRONIC 01/05/2009  . TACHYCARDIA 10/25/2008  . Palpitations 09/20/2010  . 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 Hypertension   . CHF (congestive heart failure)     due to NICM (though secondary to chemo) cath  3/09 EF 45% minimal nonobstructive CAD. Previous EF 25%. ECHO 6/08 EF 55% ECHO 5/10 EF 30-35% MRI 7/10 EF 45% Myoview 10/11 59% no ischemia  . History of breast cancer 2006    with return in 2008  . Osteoarthritis   . Anxiety   . Depression     followed by a psychiatrist  . Asthma   . GERD (gastroesophageal reflux disease)   . Gastroparesis   . IBS (irritable bowel syndrome)     Current Outpatient Prescriptions  Medication Sig Dispense Refill  . acetaminophen (TYLENOL ARTHRITIS PAIN) 650 MG CR tablet Take 1,300 mg by mouth every 8 (eight) hours as needed. pain      . atorvastatin (LIPITOR) 40 MG tablet Take 40 mg by mouth daily.        . busPIRone (BUSPAR) 30 MG tablet Take 30 mg by mouth 2 (two) times daily.       . carvedilol (COREG) 12.5 MG tablet Take 1/2 tab in the am. Take 1 tab in the pm  60 tablet  11  . citalopram (CELEXA) 20 MG tablet Take 40 mg by mouth daily.       . cyclobenzaprine (FLEXERIL) 10 MG tablet Take 10 mg by mouth 3 (three) times daily as needed. For pain      . digoxin (LANOXIN) 0.125 MG tablet Take 1 tablet (125 mcg total) by mouth daily.  30 tablet  6  . diphenhydramine-acetaminophen (TYLENOL PM) 25-500 MG TABS Take 1 tablet by mouth at bedtime as needed.  For insomnia      . furosemide (LASIX) 20 MG tablet Take 3 tablets (60 mg total) by mouth 3 (three) times daily.  180 tablet  6  . Glucosamine 500 MG CAPS Take 1 capsule by mouth daily.        . insulin lispro protamine-insulin lispro (HUMALOG 75/25) (75-25) 100 UNIT/ML SUSP 95 units with breakfast, and 95 units with the evening meal.      . Insulin Pen Needle (B-D ULTRAFINE III SHORT PEN) 31G X 8 MM MISC 18/day as directed dx 250.00  500 each  11  . levothyroxine (SYNTHROID, LEVOTHROID) 75 MCG tablet Take 75 mcg by mouth daily.        Marland Kitchen loperamide (IMODIUM A-D) 2 MG tablet Take 2 mg by mouth as needed. diarrhea      . metolazone (ZAROXOLYN) 2.5 MG tablet Take 2.5 mg by mouth daily as needed. For fluid  retention      . mometasone (NASONEX) 50 MCG/ACT nasal spray Place 2 sprays into the nose daily as needed. allergies      . nitroGLYCERIN (NITROSTAT) 0.4 MG SL tablet Place 0.4 mg under the tongue every 5 (five) minutes as needed. For chest pain      . omega-3 acid ethyl esters (LOVAZA) 1 G capsule Take 4 capsules by mouth daily.        . pantoprazole (PROTONIX) 40 MG tablet Take 40 mg by mouth 2 (two) times daily.        . potassium chloride SA (K-DUR,KLOR-CON) 20 MEQ tablet Take 60 mEq by mouth 2 (two) times daily.       . ramipril (ALTACE) 10 MG tablet Take 1 tablet (10 mg total) by mouth at bedtime.  30 tablet  6  . rOPINIRole (REQUIP) 1 MG tablet Take 1 mg by mouth at bedtime.        Marland Kitchen spironolactone (ALDACTONE) 25 MG tablet Take 1 tablet (25 mg total) by mouth daily.  30 tablet  2  . SUMAtriptan (IMITREX) 25 MG tablet Take 25 mg by mouth every 2 (two) hours as needed. For migraine      . tiotropium (SPIRIVA) 18 MCG inhalation capsule Place 18 mcg into inhaler and inhale daily.        . traMADol (ULTRAM) 50 MG tablet Take 50 mg by mouth every 6 (six) hours as needed. pain      . albuterol (PROVENTIL HFA) 108 (90 BASE) MCG/ACT inhaler Inhale 2 puffs into the lungs every 6 (six) hours as needed. For shortness of breath      . Chlorpheniramine-DM (CORICIDIN HBP COUGH/COLD PO) 2 capsules by mouth every 4 hours as needed         PHYSICAL EXAM: Filed Vitals:   07/27/12 0921  BP: 148/78  Pulse: 119  Weight: 218 lb 12.8 oz (99.247 kg)  SpO2: 92%   General:  Obese, Well appearing. No resp difficulty HEENT: normal Neck: supple. JVP hard to see. Does not look overly elevated Carotids 2+ bilaterally; no bruits. No lymphadenopathy or thryomegaly appreciated. Cor: PMI normal. Tachycardic Regular rate & rhythm. ? soft S3. Lungs: clear Abdomen: soft, obese, nontender, non distended. No hepatosplenomegaly. No bruits or masses. Good bowel sounds. Extremities: no cyanosis, clubbing, rash, tr edema.   Neuro: alert & orientedx3, cranial nerves grossly intact. Moves all 4 extremities w/o difficulty. Affect pleasant.    ASSESSMENT & PLAN:

## 2012-07-27 NOTE — Addendum Note (Signed)
Encounter addended by: Theresia Bough, CMA on: 07/27/2012 10:12 AM<BR>     Documentation filed: Orders, Patient Instructions Section

## 2012-07-28 NOTE — Addendum Note (Signed)
Encounter addended by: Cresenciano Genre on: 07/28/2012  7:41 AM<BR>     Documentation filed: Charges VN

## 2012-08-26 ENCOUNTER — Encounter (HOSPITAL_COMMUNITY): Payer: Medicare Other

## 2012-08-31 ENCOUNTER — Other Ambulatory Visit (HOSPITAL_COMMUNITY): Payer: Self-pay | Admitting: *Deleted

## 2012-08-31 MED ORDER — SPIRONOLACTONE 25 MG PO TABS: 25.0000 mg | ORAL_TABLET | Freq: Every day | ORAL | Status: DC

## 2012-09-10 ENCOUNTER — Ambulatory Visit (HOSPITAL_COMMUNITY)
Admission: RE | Admit: 2012-09-10 | Discharge: 2012-09-10 | Disposition: A | Discharge status: Home / Self Care | Payer: Medicare Other | Source: Ambulatory Visit | Attending: Internal Medicine | Admitting: Internal Medicine

## 2012-09-10 VITALS — BP 84/52 | HR 118 | Wt 221.0 lb

## 2012-09-10 DIAGNOSIS — I5022 Chronic systolic (congestive) heart failure: Secondary | ICD-10-CM

## 2012-09-10 NOTE — Patient Instructions (Addendum)
Follow up in 1 month   Do the following things EVERYDAY: 1) Weigh yourself in the morning before breakfast. Write it down and keep it in a log. 2) Take your medicines as prescribed 3) Eat low salt foods-Limit salt (sodium) to 2000 mg per day.  4) Stay as active as you can everyday 5) Limit all fluids for the day to less than 2 liters 

## 2012-09-10 NOTE — Progress Notes (Signed)
Patient ID: [name], female   DOB: [birth year], 57 y.o.   MRN: [id number] PCP: Dr Everardo All   HPI 57 year old female with history of breast cancer, obesity, HTN, diabetes, IBS, and OSA. She had chemo induced cardiomyopathy. Cardiac MRI in July 2010 EF 45%. Myoview in 10/11 EF 59%. No ischemia.   Admitted 08/2011 for ADHF after she had run out medications for 2 weeks. She responded well to IV diuretics. Discharge weight 226 . Discharged on Lasix 40 mg bid.   Echo 08/12/11: Left ventricle: mildly dilated, EF 20% to 25%. Diffuse hypokinesis. Doppler parameters are consistent with restrictive physiology, indicative of decreased left ventricular diastolic compliance and/or increased left atrial pressure. Left atrium was moderately dilated. Right atrium mildly dilated.  Admitted in June 2013 with cardiogenic shock. Required inotropes. Discharge weight 213 pounds  RHC on 6/27 showed decompensated HF with low cardiac output consistent with cardiogenic shock.  RHC 6/27  RA = 20  RV = 55/13/22  PA =56/34 (44)  PCW = 28  Fick cardiac output/index = 3.1/1.5  PVR = 5.3 Woods  FA sat = 91%  PA sat = 42%, 48%  SVC sat = 46%  04/09/12 ECHO EF 30-35% LV mildly dilated  Hyopkinesis  She returns for follow up. Last visit carvedilol increased to 12.5 mg twice a day. Occasional dyspnea when she walks quickly. Denies Orthopnea/PND/CP. Sleeps with CPAP every night.  Weight at home 210-218 pounds. SBP 99-137 DBP 66-87. HR 106-120s. She has required an extra lasix every other week. Currently not exercising. Compliant with medications. Wants clearance for teeth extraction.   ROS: All systems negative except as listed in HPI, PMH and Problem List.  Past Medical History  Diagnosis Date  . 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 DYSLIPIDEMIA 05/01/2009  . OBESITY 07/24/2007  . OBSTRUCTIVE SLEEP APNEA 07/24/2007    with CPAP compliance  . RESTLESS LEG SYNDROME 07/24/2007  . SYSTOLIC HEART FAILURE, CHRONIC 01/05/2009  . TACHYCARDIA  10/25/2008  . Palpitations 09/20/2010  . 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 Hypertension   . 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 Previous EF 25%. ECHO 6/08 EF 55% ECHO 5/10 EF 30-35% MRI 7/10 EF 45% Myoview 10/11 59% no ischemia  . History of breast cancer 2006    with return in 2008  . Osteoarthritis   . Anxiety   . Depression     followed by a psychiatrist  . Asthma   . GERD (gastroesophageal reflux disease)   . Gastroparesis   . IBS (irritable bowel syndrome)     Current Outpatient Prescriptions  Medication Sig Dispense Refill  . acetaminophen (TYLENOL ARTHRITIS PAIN) 650 MG CR tablet Take 1,300 mg by mouth every 8 (eight) hours as needed. pain      . albuterol (PROVENTIL HFA) 108 (90 BASE) MCG/ACT inhaler Inhale 2 puffs into the lungs every 6 (six) hours as needed. For shortness of breath      . atorvastatin (LIPITOR) 40 MG tablet Take 40 mg by mouth daily.        . busPIRone (BUSPAR) 30 MG tablet Take 30 mg by mouth 2 (two) times daily.       . carvedilol (COREG) 12.5 MG tablet Take 1 tablet (12.5 mg total) by mouth 2 (two) times daily with a meal.  60 tablet  11  . Chlorpheniramine-DM (CORICIDIN HBP COUGH/COLD PO) 2 capsules by mouth every 4 hours as needed      .  citalopram (CELEXA) 20 MG tablet Take 40 mg by mouth daily.       . cyclobenzaprine (FLEXERIL) 10 MG tablet Take 10 mg by mouth 3 (three) times daily as needed. For pain      . digoxin (LANOXIN) 0.125 MG tablet Take 1 tablet (125 mcg total) by mouth daily.  30 tablet  6  . diphenhydramine-acetaminophen (TYLENOL PM) 25-500 MG TABS Take 1 tablet by mouth at bedtime as needed. For insomnia      . furosemide (LASIX) 20 MG tablet Take 60 mg by mouth 2 (two) times daily.      . Glucosamine 500 MG CAPS Take 1 capsule by mouth daily.        . insulin lispro protamine-insulin lispro (HUMALOG 75/25) (75-25) 100 UNIT/ML SUSP 95 units with  breakfast, and 95 units with the evening meal.      . Insulin Pen Needle (B-D ULTRAFINE III SHORT PEN) 31G X 8 MM MISC 18/day as directed dx 250.00  500 each  11  . levothyroxine (SYNTHROID, LEVOTHROID) 75 MCG tablet Take 75 mcg by mouth daily.        Marland Kitchen loperamide (IMODIUM A-D) 2 MG tablet Take 2 mg by mouth as needed. diarrhea      . metolazone (ZAROXOLYN) 2.5 MG tablet Take 2.5 mg by mouth daily as needed. For fluid retention      . mometasone (NASONEX) 50 MCG/ACT nasal spray Place 2 sprays into the nose daily as needed. allergies      . omega-3 acid ethyl esters (LOVAZA) 1 G capsule Take 4 capsules by mouth daily.        . pantoprazole (PROTONIX) 40 MG tablet Take 40 mg by mouth 2 (two) times daily.        . potassium chloride SA (K-DUR,KLOR-CON) 20 MEQ tablet Take 60 mEq by mouth 2 (two) times daily.       . ramipril (ALTACE) 10 MG tablet Take 1 tablet (10 mg total) by mouth at bedtime.  30 tablet  6  . rOPINIRole (REQUIP) 1 MG tablet Take 1 mg by mouth at bedtime.        Marland Kitchen spironolactone (ALDACTONE) 25 MG tablet Take 1 tablet (25 mg total) by mouth daily.  30 tablet  6  . SUMAtriptan (IMITREX) 25 MG tablet Take 25 mg by mouth every 2 (two) hours as needed. For migraine      . tiotropium (SPIRIVA) 18 MCG inhalation capsule Place 18 mcg into inhaler and inhale daily.        . traMADol (ULTRAM) 50 MG tablet Take 50 mg by mouth every 6 (six) hours as needed. pain      . nitroGLYCERIN (NITROSTAT) 0.4 MG SL tablet Place 0.4 mg under the tongue every 5 (five) minutes as needed. For chest pain         PHYSICAL EXAM: Filed Vitals:   09/10/12 1431  BP: 84/52  Pulse: 118  Weight: 221 lb (100.245 kg)  SpO2: 96%   General:  Obese, Well appearing. No resp difficulty HEENT: normal Neck: supple. JVP hard to see but does not appear elevated.  Does not look overly elevated Carotids 2+ bilaterally; no bruits. No lymphadenopathy or thryomegaly appreciated. Cor: PMI normal. Tachycardic Regular rate &  rhythm. ? soft S3. Lungs: clear Abdomen: soft, obese, nontender, non distended. No hepatosplenomegaly. No bruits or masses. Good bowel sounds. Extremities: no cyanosis, clubbing, rash, tr edema.  Neuro: alert & orientedx3, cranial nerves grossly intact. Moves all 4 extremities  w/o difficulty. Affect pleasant.    ASSESSMENT & PLAN:

## 2012-09-10 NOTE — Assessment & Plan Note (Addendum)
NYHA II. Volume status stable. Continue current diuretic regimen. BP soft. Unable to titrate HF medications. Remains tachycardic despite increase beta blocker dose. Follow up in 1 month with an ECHO.   Patient seen and examined with Tonye Becket, NP. We discussed all aspects of the encounter. I agree with the assessment and plan as stated above.  Clinically improved, NYHA II-III, but remains quite tachycardic with low BP. Will repeat ECHO and see back soon. If EF down will likely need repeat RHC to further evaluate. I have cleared her from a cardiac standpoint to have her dental procedure.

## 2012-10-01 ENCOUNTER — Telehealth (HOSPITAL_COMMUNITY): Payer: Self-pay | Admitting: Cardiology

## 2012-10-01 MED ORDER — RAMIPRIL 10 MG PO TABS: 10.0000 mg | ORAL_TABLET | Freq: Every day | ORAL | Status: DC

## 2012-10-01 NOTE — Telephone Encounter (Signed)
Pt called to request refill of meds

## 2012-10-08 ENCOUNTER — Ambulatory Visit (HOSPITAL_COMMUNITY)
Admission: RE | Admit: 2012-10-08 | Discharge: 2012-10-08 | Disposition: A | Discharge status: Home / Self Care | Payer: Medicare Other | Source: Ambulatory Visit | Attending: Internal Medicine | Admitting: Internal Medicine

## 2012-10-08 ENCOUNTER — Ambulatory Visit (HOSPITAL_COMMUNITY)
Admission: RE | Admit: 2012-10-08 | Discharge: 2012-10-08 | Disposition: A | Discharge status: Home / Self Care | Payer: Medicare Other | Source: Ambulatory Visit | Attending: Pediatrics | Admitting: Pediatrics

## 2012-10-08 VITALS — BP 98/1 | HR 109 | Wt 218.5 lb

## 2012-10-08 DIAGNOSIS — R002 Palpitations: Secondary | ICD-10-CM | POA: Insufficient documentation

## 2012-10-08 DIAGNOSIS — I509 Heart failure, unspecified: Secondary | ICD-10-CM | POA: Insufficient documentation

## 2012-10-08 DIAGNOSIS — I5022 Chronic systolic (congestive) heart failure: Secondary | ICD-10-CM | POA: Insufficient documentation

## 2012-10-08 MED ORDER — NITROGLYCERIN 0.4 MG SL SUBL: 0.4000 mg | SUBLINGUAL_TABLET | SUBLINGUAL | Status: DC | PRN

## 2012-10-08 NOTE — Progress Notes (Signed)
Patient ID: [name], female   DOB: 1956/02/18, 57 y.o.   MRN: [id number] PCP: Dr Everardo All   HPI 57 year old female with history of breast cancer, obesity, HTN, diabetes, IBS, and OSA. She had chemo induced cardiomyopathy. Cardiac MRI in July 2010 EF 45%. Myoview in 10/11 EF 59%. No ischemia.   Admitted 08/2011 for ADHF after she had run out medications for 2 weeks. She responded well to IV diuretics. Discharge weight 226 . Discharged on Lasix 40 mg bid.   Echo 08/12/11: Left ventricle: mildly dilated, EF 20% to 25%. Diffuse hypokinesis. Doppler parameters are consistent with restrictive physiology, indicative of decreased left ventricular diastolic compliance and/or increased left atrial pressure. Left atrium was moderately dilated. Right atrium mildly dilated.  Admitted in June 2013 with cardiogenic shock. Required inotropes. Discharge weight 213 pounds  RHC on 6/27 showed decompensated HF with low cardiac output consistent with cardiogenic shock.  RHC 6/27  RA = 20  RV = 55/13/22  PA =56/34 (44)  PCW = 28  Fick cardiac output/index = 3.1/1.5  PVR = 5.3 [place]  FA sat = 91%  PA sat = 42%, 48%  SVC sat = 46%  04/09/12 ECHO EF 30-35% LV mildly dilated  Hyopkinesis 10/07/12 ECHO EF 20-25% (viewed personally in clinic) RV ok.  She returns for follow up. Complains of fatigue.  Says she is doing OK. Does get dyspneic with moderate exertion. Denies PND/Orthopnea. Weight at home 214- 219 pounds. She has taken one extra lasix due to elevated weight. Complaint with medications. SBP 99-130 DBP 60-90. HR 103-125.  Complains of abdominal bloating. Mother just finished chemo and she has been taking care of her and she feels she has been tired due to this. Gets dizzy if she gets up too fast.     ROS: All systems negative except as listed in HPI, PMH and Problem List.  Past Medical History  Diagnosis Date  . 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 DYSLIPIDEMIA 05/01/2009  . OBESITY 07/24/2007  . OBSTRUCTIVE SLEEP  APNEA 07/24/2007    with CPAP compliance  . RESTLESS LEG SYNDROME 07/24/2007  . SYSTOLIC HEART FAILURE, CHRONIC 01/05/2009  . TACHYCARDIA 10/25/2008  . Palpitations 09/20/2010  . 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 Hypertension   . 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 Previous EF 25%. ECHO 6/08 EF 55% ECHO 5/10 EF 30-35% MRI 7/10 EF 45% Myoview 10/11 59% no ischemia  . History of breast cancer 2006    with return in 2008  . Osteoarthritis   . Anxiety   . Depression     followed by a psychiatrist  . Asthma   . GERD (gastroesophageal reflux disease)   . Gastroparesis   . IBS (irritable bowel syndrome)     Current Outpatient Prescriptions  Medication Sig Dispense Refill  . acetaminophen (TYLENOL ARTHRITIS PAIN) 650 MG CR tablet Take 1,300 mg by mouth every 8 (eight) hours as needed. pain      . albuterol (PROVENTIL HFA) 108 (90 BASE) MCG/ACT inhaler Inhale 2 puffs into the lungs every 6 (six) hours as needed. For shortness of breath      . atorvastatin (LIPITOR) 40 MG tablet Take 40 mg by mouth daily.        . busPIRone (BUSPAR) 30 MG tablet Take 30 mg by mouth 2 (two) times daily.       . carvedilol (COREG) 12.5 MG tablet Take 1  tablet (12.5 mg total) by mouth 2 (two) times daily with a meal.  60 tablet  11  . Chlorpheniramine-DM (CORICIDIN HBP COUGH/COLD PO) 2 capsules by mouth every 4 hours as needed      . citalopram (CELEXA) 20 MG tablet Take 40 mg by mouth daily.       . cyclobenzaprine (FLEXERIL) 10 MG tablet Take 10 mg by mouth 3 (three) times daily as needed. For pain      . digoxin (LANOXIN) 0.125 MG tablet Take 1 tablet (125 mcg total) by mouth daily.  30 tablet  6  . diphenhydramine-acetaminophen (TYLENOL PM) 25-500 MG TABS Take 1 tablet by mouth at bedtime as needed. For insomnia      . furosemide (LASIX) 20 MG tablet Take 60 mg by mouth 2 (two) times daily.      . Glucosamine 500 MG CAPS  Take 1 capsule by mouth daily.        . insulin lispro protamine-insulin lispro (HUMALOG 75/25) (75-25) 100 UNIT/ML SUSP 95 units with breakfast, and 95 units with the evening meal.      . Insulin Pen Needle (B-D ULTRAFINE III SHORT PEN) 31G X 8 MM MISC 18/day as directed dx 250.00  500 each  11  . levothyroxine (SYNTHROID, LEVOTHROID) 75 MCG tablet Take 75 mcg by mouth daily.        Marland Kitchen loperamide (IMODIUM A-D) 2 MG tablet Take 2 mg by mouth as needed. diarrhea      . metolazone (ZAROXOLYN) 2.5 MG tablet Take 2.5 mg by mouth daily as needed. For fluid retention      . mometasone (NASONEX) 50 MCG/ACT nasal spray Place 2 sprays into the nose daily as needed. allergies      . nitroGLYCERIN (NITROSTAT) 0.4 MG SL tablet Place 0.4 mg under the tongue every 5 (five) minutes as needed. For chest pain      . omega-3 acid ethyl esters (LOVAZA) 1 G capsule Take 4 capsules by mouth daily.        . pantoprazole (PROTONIX) 40 MG tablet Take 40 mg by mouth 2 (two) times daily.        . potassium chloride SA (K-DUR,KLOR-CON) 20 MEQ tablet Take 60 mEq by mouth 2 (two) times daily.       . ramipril (ALTACE) 10 MG tablet Take 1 tablet (10 mg total) by mouth at bedtime.  30 tablet  6  . rOPINIRole (REQUIP) 1 MG tablet Take 1 mg by mouth at bedtime.        Marland Kitchen spironolactone (ALDACTONE) 25 MG tablet Take 1 tablet (25 mg total) by mouth daily.  30 tablet  6  . SUMAtriptan (IMITREX) 25 MG tablet Take 25 mg by mouth every 2 (two) hours as needed. For migraine      . tiotropium (SPIRIVA) 18 MCG inhalation capsule Place 18 mcg into inhaler and inhale daily.        . traMADol (ULTRAM) 50 MG tablet Take 50 mg by mouth every 6 (six) hours as needed. pain       No current facility-administered medications for this encounter.     PHYSICAL EXAM: Filed Vitals:   10/08/12 1403  BP: 98/1  Pulse: 109  Weight: 218 lb 8 oz (99.111 kg)  SpO2: 97%   General:  Obese, Well appearing. No resp difficulty HEENT: normal x for poor  dentition Neck: supple. JVP hard to see but appears ok Carotids 2+ bilaterally; no bruits. No lymphadenopathy or thryomegaly appreciated. Cor:  PMI normal. Tachycardic Regular rate & rhythm. no S3. Lungs: clear Abdomen: soft, obese, nontender, non distended. No hepatosplenomegaly. No bruits or masses. Good bowel sounds. Extremities: no cyanosis, clubbing, rash, tr edema.  Neuro: alert & orientedx3, cranial nerves grossly intact. Moves all 4 extremities w/o difficulty. Affect pleasant.    ASSESSMENT & PLAN:

## 2012-10-08 NOTE — Assessment & Plan Note (Addendum)
Patient seen and examined with Tonye Becket, NP. We discussed all aspects of the encounter. I agree with the assessment as stated above. My thoughts below.   Volume status looks good. LV function is still quite poor. NYHA III. BP too low to titrate meds. Will refer for ICD. (QRS 95 ms so not candidate CRT)  I suspect she will need advanced therapies in the near future but I am not convinced she will be able to manage more than home inotropes.

## 2012-10-08 NOTE — Patient Instructions (Addendum)
Follow up in 2 months.  Follow up at [facility] for ICD consultation with   Dr.Allred 11/04/12 @ 1:45 pm [address]     [phone]   Do the following things EVERYDAY: 1) Weigh yourself in the morning before breakfast. Write it down and keep it in a log. 2) Take your medicines as prescribed 3) Eat low salt foods-Limit salt (sodium) to 2000 mg per day.  4) Stay as active as you can everyday 5) Limit all fluids for the day to less than 2 liters

## 2012-11-04 ENCOUNTER — Ambulatory Visit (INDEPENDENT_AMBULATORY_CARE_PROVIDER_SITE_OTHER): Payer: Medicare Other | Admitting: Endocrinology

## 2012-11-04 ENCOUNTER — Encounter: Payer: Self-pay | Admitting: *Deleted

## 2012-11-04 ENCOUNTER — Encounter: Payer: Self-pay | Admitting: Endocrinology

## 2012-11-04 ENCOUNTER — Encounter: Payer: Self-pay | Admitting: Internal Medicine

## 2012-11-04 ENCOUNTER — Ambulatory Visit (INDEPENDENT_AMBULATORY_CARE_PROVIDER_SITE_OTHER): Payer: Medicare Other | Admitting: Internal Medicine

## 2012-11-04 VITALS — BP 92/50 | HR 107 | Ht 65.0 in | Wt 221.2 lb

## 2012-11-04 VITALS — BP 110/70 | HR 108 | Wt 220.0 lb

## 2012-11-04 DIAGNOSIS — E119 Type 2 diabetes mellitus without complications: Secondary | ICD-10-CM

## 2012-11-04 DIAGNOSIS — I5023 Acute on chronic systolic (congestive) heart failure: Secondary | ICD-10-CM

## 2012-11-04 DIAGNOSIS — I5022 Chronic systolic (congestive) heart failure: Secondary | ICD-10-CM

## 2012-11-04 DIAGNOSIS — I10 Essential (primary) hypertension: Secondary | ICD-10-CM

## 2012-11-04 LAB — BASIC METABOLIC PANEL
BUN: 15 mg/dL (ref 6–23)
CO2: 31 mEq/L (ref 19–32)
Chloride: 96 mEq/L (ref 96–112)
Creatinine, Ser: 0.8 mg/dL (ref 0.4–1.2)
Potassium: 3.6 mEq/L (ref 3.5–5.1)

## 2012-11-04 MED ORDER — INSULIN LISPRO PROT & LISPRO (75-25 MIX) 100 UNIT/ML ~~LOC~~ SUSP: SUBCUTANEOUS | Status: DC

## 2012-11-04 NOTE — Patient Instructions (Addendum)
Please increase humalog 75/25 to 105 units 2x a day (with 1st and last meals of the day).   Please make a follow-up appointment in 2 months.   check your blood sugar 2 times a day.  vary the time of day when you check, between before the 3 meals, and at bedtime.  also check if you have symptoms of your blood sugar being too high or too low.  please keep a record of the readings and bring it to your next appointment here.  please call us sooner if you are having low blood sugar episodes, or if it stays over 200.   good diet and exercise habits significanly improve the control of your diabetes.  please let me know if you wish to be referred to a dietician.  high blood sugar is very risky to your health.  you should see an eye doctor every year.  You are at higher than average risk for pneumonia and hepatitis-B.  You should be vaccinated against both.   controlling your blood pressure and cholesterol drastically reduces the damage diabetes does to your body.  please discuss these with your doctor.

## 2012-11-04 NOTE — Progress Notes (Signed)
Primary Care Physician: [name], MD Referring Physician:  Dr Karoline Caldwell is a 57 y.o. female with a h/o nonischemic cardiomyopathy, NYHA Class III CHF, and prior breast cancer who presents for EP consultation regarding risks of sudden death.  She reports that she that she was diagnosed with breast cancer in 2006.  She underwent lumpectomy, XRT, and radiation at that time.  She reports that she began having CHF at that time.  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   He has been managed medically since that time.  Cardiac MRI in July 2010 EF 45%. Myoview in 10/11 EF 59%. No ischemia.  She was admitted 08/2011 for ADHF after she had run out medications for 2 weeks. She responded well to IV diuretics.  Echo 08/12/11: Left ventricle: mildly dilated, EF 20% to 25%. Diffuse hypokinesis. Doppler parameters are consistent with restrictive physiology, indicative of decreased left ventricular diastolic compliance and/or increased left atrial pressure. Left atrium was moderately dilated. Right atrium mildly dilated. Echo 10/08/12 revealed that her EF remains 20-25% despite treatment with an optimal medical regimen.   Presently, she reports SOB with moderate activity.  She reports DOE with greater than 100 ft.   Today, she denies symptoms of palpitations, shortness of breath, orthopnea, PND, lower extremity edema, dizziness, presyncope, syncope, or neurologic sequela. The patient is tolerating medications without difficulties and is otherwise without complaint today.   Past Medical History  Diagnosis Date  . 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 DYSLIPIDEMIA 05/01/2009  . OBESITY 07/24/2007  . OBSTRUCTIVE SLEEP APNEA 07/24/2007    with CPAP compliance  . RESTLESS LEG SYNDROME 07/24/2007  . SYSTOLIC HEART FAILURE, CHRONIC 01/05/2009  . TACHYCARDIA 10/25/2008  . Palpitations 09/20/2010  . 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 Hypertension   . CHF (congestive heart failure)      due to NICM (though secondary to chemo) cath 3/09 EF 45% minimal nonobstructive CAD. Previous EF 25%. ECHO 6/08 EF 55% ECHO 5/10 EF 30-35% MRI 7/10 EF 45% Myoview 10/11 59% no ischemia  . History of breast cancer 2006    with return in 2008, s/p mastectomy, XRT and chemotherapy  . Osteoarthritis   . Anxiety   . Depression     followed by a psychiatrist  . Asthma   . GERD (gastroesophageal reflux disease)   . Gastroparesis   . IBS (irritable bowel syndrome)    Past Surgical History  Procedure Laterality Date  . Mastectomy      right  . Abdominal hysterectomy  1991    Current Outpatient Prescriptions  Medication Sig Dispense Refill  . acetaminophen (TYLENOL ARTHRITIS PAIN) 650 MG CR tablet Take 1,300 mg by mouth every 8 (eight) hours as needed. pain      . albuterol (PROVENTIL HFA) 108 (90 BASE) MCG/ACT inhaler Inhale 2 puffs into the lungs every 6 (six) hours as needed. For shortness of breath      . atorvastatin (LIPITOR) 40 MG tablet Take 40 mg by mouth daily.        . busPIRone (BUSPAR) 30 MG tablet Take 30 mg by mouth 2 (two) times daily.       . carvedilol (COREG) 12.5 MG tablet Take 1 tablet (12.5 mg total) by mouth 2 (two) times daily with a meal.  60 tablet  11  . citalopram (CELEXA) 20 MG tablet Take 40 mg by mouth daily.       . cyclobenzaprine (FLEXERIL) 10 MG tablet Take 10 mg  by mouth 2 (two) times daily as needed for muscle spasms. For pain      . digoxin (LANOXIN) 0.125 MG tablet Take 1 tablet (125 mcg total) by mouth daily.  30 tablet  6  . diphenhydramine-acetaminophen (TYLENOL PM) 25-500 MG TABS Take 1 tablet by mouth at bedtime as needed. For insomnia      . furosemide (LASIX) 20 MG tablet Take 60 mg by mouth 2 (two) times daily.      . Glucosamine 500 MG CAPS Take 1 capsule by mouth daily.        . insulin lispro protamine-insulin lispro (HUMALOG 75/25) (75-25) 100 UNIT/ML SUSP 95 units with breakfast, and 95 units with the evening meal.      . Insulin Pen  Needle (B-D ULTRAFINE III SHORT PEN) 31G X 8 MM MISC 18/day as directed dx 250.00  500 each  11  . levothyroxine (SYNTHROID, LEVOTHROID) 75 MCG tablet Take 75 mcg by mouth daily.        Marland Kitchen loperamide (IMODIUM A-D) 2 MG tablet Take 2 mg by mouth as needed. diarrhea      . mometasone (NASONEX) 50 MCG/ACT nasal spray Place 2 sprays into the nose daily as needed. allergies      . nitroGLYCERIN (NITROSTAT) 0.4 MG SL tablet Place 1 tablet (0.4 mg total) under the tongue every 5 (five) minutes as needed. For chest pain  25 tablet  3  . omega-3 acid ethyl esters (LOVAZA) 1 G capsule Take 4 capsules by mouth daily.        . pantoprazole (PROTONIX) 40 MG tablet Take 40 mg by mouth 2 (two) times daily.        . potassium chloride SA (K-DUR,KLOR-CON) 20 MEQ tablet Take 60 mEq by mouth 2 (two) times daily.       . ramipril (ALTACE) 10 MG tablet Take 1 tablet (10 mg total) by mouth at bedtime.  30 tablet  6  . rOPINIRole (REQUIP) 1 MG tablet Take 1 mg by mouth at bedtime.        Marland Kitchen spironolactone (ALDACTONE) 25 MG tablet Take 1 tablet (25 mg total) by mouth daily.  30 tablet  6  . SUMAtriptan (IMITREX) 25 MG tablet Take 25 mg by mouth every 2 (two) hours as needed. For migraine      . tiotropium (SPIRIVA) 18 MCG inhalation capsule Place 18 mcg into inhaler and inhale daily.        . traMADol (ULTRAM) 50 MG tablet Take 50 mg by mouth every 6 (six) hours as needed. pain       No current facility-administered medications for this visit.    Allergies  Allergen Reactions  . Aspirin Hives  . Ciprofloxacin Hives  . Codeine Nausea Only  . Naproxen Hives  . Sulfonamide Derivatives Hives  . Tape Rash    Also allergic to metal    History   Social History  . Marital Status: Divorced    Spouse Name: N/A    Number of Children: N/A  . Years of Education: N/A   Occupational History  . Disabled    Social History Main Topics  . Smoking status: Former Games developer  . Smokeless tobacco: Former Neurosurgeon    Quit date:  03/05/2001  . Alcohol Use: No  . Drug Use: No  . Sexually Active: Not on file   Other Topics Concern  . Not on file   Social History Narrative   Lives with mother and brother Lalla Brothers.  Regular exercise-no    Family History  Problem Relation Age of Onset  . Diabetes Mother   . Hypertension Other   . Coronary artery disease Father 95    ROS- All systems are reviewed and negative except as per the HPI above  Physical Exam: Filed Vitals:   11/04/12 1356  BP: 92/50  Pulse: 107  Height: 5\' 5"  (1.651 m)  Weight: 221 lb 4 oz (100.358 kg)    GEN- The patient is well appearing, alert and oriented x 3 today.   Head- normocephalic, atraumatic Eyes-  Sclera clear, conjunctiva pink Ears- hearing intact Oropharynx- clear Neck- supple, no JVP Lymph- no cervical lymphadenopathy Lungs- Clear to ausculation bilaterally, normal work of breathing Heart- Regular rate and rhythm, no murmurs, rubs or gallops, PMI not laterally displaced GI- soft, NT, ND, + BS Extremities- no clubbing, cyanosis, or edema MS- no significant deformity or atrophy Skin- no rash or lesion Psych- euthymic mood, full affect Neuro- strength and sensation are intact  EKG today reveals sinus tachycardia 107 bpm, QRS 100 msec, LAD, poor R wave progression Echo 10/08/12- EF 20-25%  Assessment and Plan:

## 2012-11-04 NOTE — Progress Notes (Signed)
Subjective:    Patient ID: [name], female    DOB: 1955-10-26, 57 y.o.   MRN: [id number]  HPI Pt returns for f/u of insulin-requiring DM (dx'ed 2007; complicated by peripheral sensory neuropathy; she has never had severe hypoglycemia or DKA).  she brings a record of her cbg's which i have reviewed today.  It varies from 100-200's, but most are in the high-100's.  There is no trend throughout the day.   Past Medical History  Diagnosis Date  . 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 DYSLIPIDEMIA 05/01/2009  . OBESITY 07/24/2007  . OBSTRUCTIVE SLEEP APNEA 07/24/2007    with CPAP compliance  . RESTLESS LEG SYNDROME 07/24/2007  . SYSTOLIC HEART FAILURE, CHRONIC 01/05/2009  . TACHYCARDIA 10/25/2008  . Palpitations 09/20/2010  . 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 Hypertension   . 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 Previous EF 25%. ECHO 6/08 EF 55% ECHO 5/10 EF 30-35% MRI 7/10 EF 45% Myoview 10/11 59% no ischemia  . History of breast cancer 2006    with return in 2008, s/p mastectomy, XRT and chemotherapy  . Osteoarthritis   . Anxiety   . Depression     followed by a psychiatrist  . Asthma   . GERD (gastroesophageal reflux disease)   . Gastroparesis   . IBS (irritable bowel syndrome)     Past Surgical History  Procedure Laterality Date  . Mastectomy      right  . Abdominal hysterectomy  1991    History   Social History  . Marital Status: Divorced    Spouse Name: N/A    Number of Children: N/A  . Years of Education: N/A   Occupational History  . Disabled    Social History Main Topics  . Smoking status: Former Games developer  . Smokeless tobacco: Former Neurosurgeon    Quit date: 03/05/2001  . Alcohol Use: No  . Drug Use: No  . Sexually Active: Not on file   Other Topics Concern  . Not on file   Social History Narrative   Lives with mother and brother Lalla Brothers.   Regular exercise-no    Current  Outpatient Prescriptions on File Prior to Visit  Medication Sig Dispense Refill  . acetaminophen (TYLENOL ARTHRITIS PAIN) 650 MG CR tablet Take 1,300 mg by mouth every 8 (eight) hours as needed. pain      . albuterol (PROVENTIL HFA) 108 (90 BASE) MCG/ACT inhaler Inhale 2 puffs into the lungs every 6 (six) hours as needed. For shortness of breath      . atorvastatin (LIPITOR) 40 MG tablet Take 40 mg by mouth daily.        . busPIRone (BUSPAR) 30 MG tablet Take 30 mg by mouth 2 (two) times daily.       . carvedilol (COREG) 12.5 MG tablet Take 1 tablet (12.5 mg total) by mouth 2 (two) times daily with a meal.  60 tablet  11  . citalopram (CELEXA) 20 MG tablet Take 40 mg by mouth daily.       . cyclobenzaprine (FLEXERIL) 10 MG tablet Take 10 mg by mouth 2 (two) times daily as needed for muscle spasms. For pain      . digoxin (LANOXIN) 0.125 MG tablet Take 1 tablet (125 mcg total) by mouth daily.  30 tablet  6  . diphenhydramine-acetaminophen (TYLENOL PM) 25-500 MG TABS Take 1 tablet by mouth at bedtime as needed. For insomnia      .  furosemide (LASIX) 20 MG tablet Take 60 mg by mouth 2 (two) times daily.      . Glucosamine 500 MG CAPS Take 1 capsule by mouth daily.        . Insulin Pen Needle (B-D ULTRAFINE III SHORT PEN) 31G X 8 MM MISC 18/day as directed dx 250.00  500 each  11  . levothyroxine (SYNTHROID, LEVOTHROID) 75 MCG tablet Take 75 mcg by mouth daily.        Marland Kitchen loperamide (IMODIUM A-D) 2 MG tablet Take 2 mg by mouth as needed. diarrhea      . mometasone (NASONEX) 50 MCG/ACT nasal spray Place 2 sprays into the nose daily as needed. allergies      . nitroGLYCERIN (NITROSTAT) 0.4 MG SL tablet Place 1 tablet (0.4 mg total) under the tongue every 5 (five) minutes as needed. For chest pain  25 tablet  3  . omega-3 acid ethyl esters (LOVAZA) 1 G capsule Take 4 capsules by mouth daily.        . pantoprazole (PROTONIX) 40 MG tablet Take 40 mg by mouth 2 (two) times daily.        . potassium chloride  SA (K-DUR,KLOR-CON) 20 MEQ tablet Take 60 mEq by mouth 2 (two) times daily.       . ramipril (ALTACE) 10 MG tablet Take 1 tablet (10 mg total) by mouth at bedtime.  30 tablet  6  . rOPINIRole (REQUIP) 1 MG tablet Take 1 mg by mouth at bedtime.        Marland Kitchen spironolactone (ALDACTONE) 25 MG tablet Take 1 tablet (25 mg total) by mouth daily.  30 tablet  6  . SUMAtriptan (IMITREX) 25 MG tablet Take 25 mg by mouth every 2 (two) hours as needed. For migraine      . tiotropium (SPIRIVA) 18 MCG inhalation capsule Place 18 mcg into inhaler and inhale daily.        . traMADol (ULTRAM) 50 MG tablet Take 50 mg by mouth every 6 (six) hours as needed. pain       No current facility-administered medications on file prior to visit.    Allergies  Allergen Reactions  . Aspirin Hives  . Ciprofloxacin Hives  . Codeine Nausea Only  . Naproxen Hives  . Sulfonamide Derivatives Hives  . Tape Rash    Also allergic to metal    Family History  Problem Relation Age of Onset  . Diabetes Mother   . Hypertension Other   . Coronary artery disease Father 61    BP 110/70  Pulse 108  Wt 220 lb (99.791 kg)  BMI 36.61 kg/m2  SpO2 95%  Review of Systems denies hypoglycemia.      Objective:   Physical Exam VITAL SIGNS:  See vs page GENERAL: no distress Pulses: dorsalis pedis intact bilat.   Feet: no deformity.  no ulcer on the feet.  feet are of normal color and temp.  no edema.  There is bilateral onychomycosis.   Neuro: sensation is intact to touch on the feet, but decreased from normal.       Assessment & Plan:  DM: needs increased rx

## 2012-11-04 NOTE — Patient Instructions (Addendum)
Your physician has recommended that you have a defibrillator inserted. An implantable cardioverter defibrillator (ICD) is a small device that is placed in your chest or, in rare cases, your abdomen. This device uses electrical pulses or shocks to help control life-threatening, irregular heartbeats that could lead the heart to suddenly stop beating (sudden cardiac arrest). Leads are attached to the ICD that goes into your heart. This is done in the hospital and usually requires an overnight stay. Please see the instruction sheet given to you today for more information.   

## 2012-11-05 ENCOUNTER — Encounter (HOSPITAL_COMMUNITY): Payer: Self-pay | Admitting: Pharmacy Technician

## 2012-11-05 ENCOUNTER — Other Ambulatory Visit: Payer: Self-pay | Admitting: *Deleted

## 2012-11-05 DIAGNOSIS — I519 Heart disease, unspecified: Secondary | ICD-10-CM

## 2012-11-05 DIAGNOSIS — I509 Heart failure, unspecified: Secondary | ICD-10-CM

## 2012-11-06 ENCOUNTER — Other Ambulatory Visit (HOSPITAL_COMMUNITY): Payer: Self-pay | Admitting: Cardiology

## 2012-11-06 DIAGNOSIS — I5022 Chronic systolic (congestive) heart failure: Secondary | ICD-10-CM

## 2012-11-06 MED ORDER — FUROSEMIDE 20 MG PO TABS: 60.0000 mg | ORAL_TABLET | Freq: Two times a day (BID) | ORAL | Status: DC

## 2012-11-06 NOTE — Assessment & Plan Note (Signed)
The patient has a nonischemic CM (EF 20%)and NYHA Class III CHF. At this time, she meets SCD-HeFT criteria for ICD implantation for primary prevention of sudden death.  Risks, benefits, alternatives to ICD implantation were discussed in detail with the patient today. The patient  understands that the risks include but are not limited to bleeding, infection, pneumothorax, perforation, tamponade, vascular damage, renal failure, MI, stroke, death, inappropriate shocks, and lead dislodgement and wishes to proceed.  We will therefore schedule device implantation at the next available time.

## 2012-11-06 NOTE — Assessment & Plan Note (Signed)
Stable No change required today  

## 2012-11-06 NOTE — Telephone Encounter (Signed)
Pt called to request a refill on lasix

## 2012-11-09 MED ORDER — SODIUM CHLORIDE 0.9 % IR SOLN
80.0000 mg | Status: DC
  Filled 2012-11-09: qty 2

## 2012-11-09 MED ORDER — CEFAZOLIN SODIUM-DEXTROSE 2-3 GM-% IV SOLR
2.0000 g | INTRAVENOUS | Status: DC
  Filled 2012-11-09 (×2): qty 50

## 2012-11-10 ENCOUNTER — Ambulatory Visit (HOSPITAL_COMMUNITY)
Admission: RE | Admit: 2012-11-10 | Discharge: 2012-11-11 | Disposition: A | Discharge status: Home / Self Care | Payer: Medicare Other | Source: Ambulatory Visit | Attending: Internal Medicine | Admitting: Internal Medicine

## 2012-11-10 ENCOUNTER — Encounter (HOSPITAL_COMMUNITY): Payer: Self-pay | Admitting: General Practice

## 2012-11-10 ENCOUNTER — Encounter (HOSPITAL_COMMUNITY)
Admission: RE | Disposition: A | Discharge status: Home / Self Care | Payer: Self-pay | Source: Ambulatory Visit | Attending: Internal Medicine

## 2012-11-10 DIAGNOSIS — E669 Obesity, unspecified: Secondary | ICD-10-CM

## 2012-11-10 DIAGNOSIS — M79609 Pain in unspecified limb: Secondary | ICD-10-CM

## 2012-11-10 DIAGNOSIS — Z95810 Presence of automatic (implantable) cardiac defibrillator: Secondary | ICD-10-CM

## 2012-11-10 DIAGNOSIS — I5023 Acute on chronic systolic (congestive) heart failure: Secondary | ICD-10-CM

## 2012-11-10 DIAGNOSIS — E032 Hypothyroidism due to medicaments and other exogenous substances: Secondary | ICD-10-CM

## 2012-11-10 DIAGNOSIS — Z853 Personal history of malignant neoplasm of breast: Secondary | ICD-10-CM | POA: Insufficient documentation

## 2012-11-10 DIAGNOSIS — I509 Heart failure, unspecified: Secondary | ICD-10-CM

## 2012-11-10 DIAGNOSIS — I10 Essential (primary) hypertension: Secondary | ICD-10-CM

## 2012-11-10 DIAGNOSIS — G2581 Restless legs syndrome: Secondary | ICD-10-CM

## 2012-11-10 DIAGNOSIS — G4733 Obstructive sleep apnea (adult) (pediatric): Secondary | ICD-10-CM

## 2012-11-10 DIAGNOSIS — J9620 Acute and chronic respiratory failure, unspecified whether with hypoxia or hypercapnia: Secondary | ICD-10-CM

## 2012-11-10 DIAGNOSIS — R000 Tachycardia, unspecified: Secondary | ICD-10-CM

## 2012-11-10 DIAGNOSIS — I428 Other cardiomyopathies: Secondary | ICD-10-CM

## 2012-11-10 DIAGNOSIS — R002 Palpitations: Secondary | ICD-10-CM

## 2012-11-10 DIAGNOSIS — I5022 Chronic systolic (congestive) heart failure: Secondary | ICD-10-CM

## 2012-11-10 DIAGNOSIS — I519 Heart disease, unspecified: Secondary | ICD-10-CM

## 2012-11-10 DIAGNOSIS — E119 Type 2 diabetes mellitus without complications: Secondary | ICD-10-CM

## 2012-11-10 DIAGNOSIS — E785 Hyperlipidemia, unspecified: Secondary | ICD-10-CM

## 2012-11-10 DIAGNOSIS — R570 Cardiogenic shock: Secondary | ICD-10-CM

## 2012-11-10 HISTORY — DX: Presence of automatic (implantable) cardiac defibrillator: Z95.810

## 2012-11-10 HISTORY — PX: CARDIAC DEFIBRILLATOR PLACEMENT: SHX171

## 2012-11-10 HISTORY — DX: Cardiac arrhythmia, unspecified: I49.9

## 2012-11-10 HISTORY — PX: BI-VENTRICULAR IMPLANTABLE CARDIOVERTER DEFIBRILLATOR: SHX5459

## 2012-11-10 HISTORY — DX: Acute myocardial infarction, unspecified: I21.9

## 2012-11-10 HISTORY — DX: Chronic obstructive pulmonary disease, unspecified: J44.9

## 2012-11-10 LAB — CBC
MCH: 28.9 pg (ref 26.0–34.0)
MCHC: 34.4 g/dL (ref 30.0–36.0)
MCV: 84 fL (ref 78.0–100.0)
Platelets: 298 10*3/uL (ref 150–400)
RDW: 16.2 % — ABNORMAL HIGH (ref 11.5–15.5)

## 2012-11-10 LAB — GLUCOSE, CAPILLARY: Glucose-Capillary: 154 mg/dL — ABNORMAL HIGH (ref 70–99)

## 2012-11-10 LAB — SURGICAL PCR SCREEN: MRSA, PCR: POSITIVE — AB

## 2012-11-10 SURGERY — BI-VENTRICULAR IMPLANTABLE CARDIOVERTER DEFIBRILLATOR  (CRT-D)
Anesthesia: LOCAL

## 2012-11-10 MED ORDER — BUSPIRONE HCL 15 MG PO TABS: 30.0000 mg | ORAL_TABLET | Freq: Two times a day (BID) | ORAL | Status: DC

## 2012-11-10 MED ORDER — SODIUM CHLORIDE 0.9 % IV SOLN
INTRAVENOUS | Status: DC
  Administered 2012-11-10: 07:00:00 via INTRAVENOUS

## 2012-11-10 MED ORDER — ACETAMINOPHEN 325 MG PO TABS
325.0000 mg | ORAL_TABLET | ORAL | Status: DC | PRN
  Administered 2012-11-10: 650 mg via ORAL
  Filled 2012-11-10: qty 2

## 2012-11-10 MED ORDER — MIDAZOLAM HCL 5 MG/5ML IJ SOLN
INTRAMUSCULAR | Status: AC
  Filled 2012-11-10: qty 5

## 2012-11-10 MED ORDER — SODIUM CHLORIDE 0.9 % IV SOLN: 250.0000 mL | INTRAVENOUS | Status: DC | PRN

## 2012-11-10 MED ORDER — INSULIN ASPART 100 UNIT/ML ~~LOC~~ SOLN
0.0000 [IU] | Freq: Three times a day (TID) | SUBCUTANEOUS | Status: DC
  Administered 2012-11-10 (×2): 3 [IU] via SUBCUTANEOUS

## 2012-11-10 MED ORDER — CARVEDILOL 12.5 MG PO TABS: 12.5000 mg | ORAL_TABLET | Freq: Two times a day (BID) | ORAL | Status: DC

## 2012-11-10 MED ORDER — RAMIPRIL 10 MG PO TABS
10.0000 mg | ORAL_TABLET | Freq: Every day | ORAL | Status: DC
Start: 2012-11-10 — End: 2012-11-10

## 2012-11-10 MED ORDER — SODIUM CHLORIDE 0.9 % IJ SOLN: 3.0000 mL | Freq: Two times a day (BID) | INTRAMUSCULAR | Status: DC

## 2012-11-10 MED ORDER — ATORVASTATIN CALCIUM 40 MG PO TABS
40.0000 mg | ORAL_TABLET | Freq: Every day | ORAL | Status: DC
  Filled 2012-11-10: qty 1

## 2012-11-10 MED ORDER — PANTOPRAZOLE SODIUM 40 MG PO TBEC
40.0000 mg | DELAYED_RELEASE_TABLET | Freq: Two times a day (BID) | ORAL | Status: DC
  Administered 2012-11-10 – 2012-11-11 (×3): 40 mg via ORAL
  Filled 2012-11-10 (×3): qty 1

## 2012-11-10 MED ORDER — CEFAZOLIN SODIUM 1-5 GM-% IV SOLN
INTRAVENOUS | Status: AC
  Filled 2012-11-10: qty 100

## 2012-11-10 MED ORDER — DIPHENHYDRAMINE HCL 25 MG PO CAPS
25.0000 mg | ORAL_CAPSULE | Freq: Every day | ORAL | Status: DC
  Administered 2012-11-10: 25 mg via ORAL
  Filled 2012-11-10 (×2): qty 1

## 2012-11-10 MED ORDER — SPIRONOLACTONE 25 MG PO TABS
25.0000 mg | ORAL_TABLET | Freq: Every day | ORAL | Status: DC
  Administered 2012-11-10 – 2012-11-11 (×2): 25 mg via ORAL
  Filled 2012-11-10 (×2): qty 1

## 2012-11-10 MED ORDER — TIOTROPIUM BROMIDE MONOHYDRATE 18 MCG IN CAPS
18.0000 ug | ORAL_CAPSULE | Freq: Every day | RESPIRATORY_TRACT | Status: DC
  Administered 2012-11-11: 18 ug via RESPIRATORY_TRACT
  Filled 2012-11-10: qty 5

## 2012-11-10 MED ORDER — BUSPIRONE HCL 15 MG PO TABS
30.0000 mg | ORAL_TABLET | Freq: Two times a day (BID) | ORAL | Status: DC
  Administered 2012-11-10 – 2012-11-11 (×3): 30 mg via ORAL
  Filled 2012-11-10 (×4): qty 2

## 2012-11-10 MED ORDER — TRAMADOL HCL 50 MG PO TABS
50.0000 mg | ORAL_TABLET | Freq: Four times a day (QID) | ORAL | Status: DC | PRN
  Administered 2012-11-11: 50 mg via ORAL
  Filled 2012-11-10: qty 1

## 2012-11-10 MED ORDER — CYCLOBENZAPRINE HCL 10 MG PO TABS
10.0000 mg | ORAL_TABLET | Freq: Every day | ORAL | Status: DC
  Administered 2012-11-10: 10 mg via ORAL
  Filled 2012-11-10 (×2): qty 1

## 2012-11-10 MED ORDER — FUROSEMIDE 40 MG PO TABS
60.0000 mg | ORAL_TABLET | Freq: Two times a day (BID) | ORAL | Status: DC
  Administered 2012-11-10 – 2012-11-11 (×3): 60 mg via ORAL
  Filled 2012-11-10 (×5): qty 1

## 2012-11-10 MED ORDER — INSULIN ASPART 100 UNIT/ML ~~LOC~~ SOLN
0.0000 [IU] | Freq: Three times a day (TID) | SUBCUTANEOUS | Status: DC
Start: 2012-11-10 — End: 2012-11-11
  Administered 2012-11-10: 5 [IU] via SUBCUTANEOUS
  Administered 2012-11-11: 08:00:00 3 [IU] via SUBCUTANEOUS

## 2012-11-10 MED ORDER — ROPINIROLE HCL 1 MG PO TABS
1.0000 mg | ORAL_TABLET | Freq: Every day | ORAL | Status: DC
  Administered 2012-11-10: 21:00:00 1 mg via ORAL
  Filled 2012-11-10 (×2): qty 1

## 2012-11-10 MED ORDER — LEVOTHYROXINE SODIUM 75 MCG PO TABS
75.0000 ug | ORAL_TABLET | Freq: Every day | ORAL | Status: DC
  Administered 2012-11-10 – 2012-11-11 (×2): 75 ug via ORAL
  Filled 2012-11-10 (×3): qty 1

## 2012-11-10 MED ORDER — CYCLOBENZAPRINE HCL 10 MG PO TABS: 10.0000 mg | ORAL_TABLET | Freq: Two times a day (BID) | ORAL | Status: DC

## 2012-11-10 MED ORDER — ONDANSETRON HCL 4 MG/2ML IJ SOLN: 4.0000 mg | Freq: Four times a day (QID) | INTRAMUSCULAR | Status: DC | PRN

## 2012-11-10 MED ORDER — RAMIPRIL 10 MG PO CAPS
10.0000 mg | ORAL_CAPSULE | Freq: Every day | ORAL | Status: DC
  Administered 2012-11-10: 10 mg via ORAL
  Filled 2012-11-10 (×2): qty 1

## 2012-11-10 MED ORDER — CEFAZOLIN SODIUM 1-5 GM-% IV SOLN
1.0000 g | Freq: Four times a day (QID) | INTRAVENOUS | Status: AC
  Administered 2012-11-10 – 2012-11-11 (×3): 1 g via INTRAVENOUS
  Filled 2012-11-10 (×3): qty 50

## 2012-11-10 MED ORDER — LIDOCAINE HCL (PF) 1 % IJ SOLN
INTRAMUSCULAR | Status: AC
  Filled 2012-11-10: qty 30

## 2012-11-10 MED ORDER — MUPIROCIN 2 % EX OINT
TOPICAL_OINTMENT | Freq: Two times a day (BID) | CUTANEOUS | Status: DC
  Administered 2012-11-10: 1 via NASAL
  Filled 2012-11-10: qty 22

## 2012-11-10 MED ORDER — MUPIROCIN 2 % EX OINT
TOPICAL_OINTMENT | CUTANEOUS | Status: AC
  Filled 2012-11-10: qty 22

## 2012-11-10 MED ORDER — CHLORHEXIDINE GLUCONATE 4 % EX LIQD: 60.0000 mL | Freq: Once | CUTANEOUS | Status: DC

## 2012-11-10 MED ORDER — ALBUTEROL SULFATE HFA 108 (90 BASE) MCG/ACT IN AERS: 2.0000 | INHALATION_SPRAY | Freq: Four times a day (QID) | RESPIRATORY_TRACT | Status: DC | PRN

## 2012-11-10 MED ORDER — SODIUM CHLORIDE 0.9 % IJ SOLN: 3.0000 mL | INTRAMUSCULAR | Status: DC | PRN

## 2012-11-10 MED ORDER — FENTANYL CITRATE 0.05 MG/ML IJ SOLN
INTRAMUSCULAR | Status: AC
  Filled 2012-11-10: qty 2

## 2012-11-10 MED ORDER — ACETAMINOPHEN 500 MG PO TABS
500.0000 mg | ORAL_TABLET | Freq: Every day | ORAL | Status: DC
  Administered 2012-11-10: 500 mg via ORAL
  Filled 2012-11-10 (×2): qty 1

## 2012-11-10 MED ORDER — CITALOPRAM HYDROBROMIDE 40 MG PO TABS
40.0000 mg | ORAL_TABLET | Freq: Every day | ORAL | Status: DC
  Administered 2012-11-10: 21:00:00 40 mg via ORAL
  Filled 2012-11-10 (×2): qty 1

## 2012-11-10 MED ORDER — CITALOPRAM HYDROBROMIDE 40 MG PO TABS
40.0000 mg | ORAL_TABLET | Freq: Every day | ORAL | Status: DC
  Filled 2012-11-10: qty 1

## 2012-11-10 MED ORDER — POTASSIUM CHLORIDE CRYS ER 20 MEQ PO TBCR
60.0000 meq | EXTENDED_RELEASE_TABLET | Freq: Two times a day (BID) | ORAL | Status: DC
  Administered 2012-11-10 – 2012-11-11 (×3): 60 meq via ORAL
  Filled 2012-11-10 (×4): qty 3

## 2012-11-10 MED ORDER — DIPHENHYDRAMINE-APAP (SLEEP) 25-500 MG PO TABS: 1.0000 | ORAL_TABLET | Freq: Every day | ORAL | Status: DC

## 2012-11-10 MED ORDER — ATORVASTATIN CALCIUM 40 MG PO TABS
40.0000 mg | ORAL_TABLET | Freq: Every day | ORAL | Status: DC
  Administered 2012-11-10: 21:00:00 40 mg via ORAL
  Filled 2012-11-10 (×2): qty 1

## 2012-11-10 MED ORDER — DIGOXIN 125 MCG PO TABS
125.0000 ug | ORAL_TABLET | Freq: Every day | ORAL | Status: DC
  Administered 2012-11-10 – 2012-11-11 (×2): 125 ug via ORAL
  Filled 2012-11-10 (×2): qty 1

## 2012-11-10 MED ORDER — CARVEDILOL 25 MG PO TABS
25.0000 mg | ORAL_TABLET | Freq: Two times a day (BID) | ORAL | Status: DC
  Administered 2012-11-10 – 2012-11-11 (×2): 25 mg via ORAL
  Filled 2012-11-10 (×4): qty 1

## 2012-11-10 NOTE — Progress Notes (Signed)
RT set patient up on CPAP machine.  Patient stated that she uses a full face mask at home and her CPAP pressure is 10.  RT put machine on these settings and used a medium full face mask for patient.  Patient tolerating well at this time.  RT will continue to monitor.

## 2012-11-10 NOTE — Op Note (Signed)
SURGEON:  Hillis Range, MD      PREPROCEDURE DIAGNOSES:   1. Nonschemic cardiomyopathy.   2. New York Heart Association class III, heart failure chronically.      POSTPROCEDURE DIAGNOSES:   1. nonschemic cardiomyopathy.   2. New York Heart Association class III heart failure chronically.      PROCEDURES:    1. ICD implantation.  2. Left upper extremity venography  3. Defibrillation threshold testing     INTRODUCTION: [name] is a 57 y.o. female with a nonischemic CM (EF 20%) and NYHA Class III CHF. At this time, she meets SCD-HeFT criteria for ICD implantation for primary prevention of sudden death.  The patient has a narrow QRS and does not meet criteria for revascularization.  The patient has been treated with an optimal medical regimen but continues to have a depressed ejection fraction and NYHA Class III CHF symptoms.  The patient therefore  presents today for ICD implantation.      DESCRIPTION OF PROCEDURE:  Informed written consent was obtained and the patient was brought to the electrophysiology lab in the fasting state. The patient was adequately sedated with intravenous Versed, and fentanyl as outlined in the nursing report.  The patient's left chest was prepped and draped in the usual sterile fashion by the EP lab staff.  The skin overlying the left deltopectoral region was infiltrated with lidocaine for local analgesia.  A 5-cm incision was made over the left deltopectoral region.  A left subcutaneous defibrillator pocket was fashioned using a combination of sharp and blunt dissection.  Electrocautery was used to assure hemostasis.   Left Upper extremity Venography:  A venogram of the left upper extremity was performed which revealed a large sized left axillary vein which emptied into a moderate sized left subclavian vein.    RV Lead Placement: The left axillary vein was cannulated with fluoroscopic visualization.   Through the left axillary vein, a [employer]  [place], model 9147W-29 (serial number [id number]) right ventricular defibrillator lead was advanced with fluoroscopic visualization into the right ventricular apex position.  Initial right ventricular lead R-wave measured 10 mV with impedance of 590 ohms and a threshold of 0.7 volts at 0.5 milliseconds.   The leads were secured to the pectoralis  fascia using #2 silk suture over the suture sleeves.  The pocket then  irrigated with copious gentamicin solution.  The leads were then  connected to a [facility] Assura model 319 [id number] 3424 (serial  Number [id number]) ICD.  The defibrillator was placed into the  pocket.  The pocket was then closed in 2 layers with 2.0 Vicryl suture  for the subcutaneous and subcuticular layers.  Steri-Strips and a  sterile dressing were then applied.   DFT Testing: Defibrillation Threshold testing was then performed. Ventricular fibrillation was induced with 50Hz  fibber.  Adequate sensing of ventricular  fibrillation was observed with minimal dropout with a programmed sensitivity of 1.60mV.  The patient was successfully defibrillated to sinus rhythm with a single 15 joules shock delivered from the device with an impedance of 72 ohms in a duration of 4.0 seconds.  The patient remained in sinus rhythm thereafter.  There were no early apparent complications.     CONCLUSIONS:   1. Nonischemic cardiomyopathy with chronic New York Heart Association class III heart failure.   2. Successful ICD implantation.   3. DFT less than or equal to 15 joules.   4. No early apparent complications.

## 2012-11-10 NOTE — Discharge Instructions (Signed)
° °  Supplemental Discharge Instructions for  Pacemaker/Defibrillator Patients  Activity No heavy lifting or vigorous activity with your left/right arm for 6 to 8 weeks.  Do not raise your left/right arm above your head for one week.  Gradually raise your affected arm as drawn below.           04/10                       04/11                       04/12                      04/13       NO DRIVING for 1 week; you may begin driving on 16/05/9603. WOUND CARE   Keep the wound area clean and dry.  Do not get this area wet for one week. No showers for one week; you may shower on 11/19/2012.   The tape/steri-strips on your wound will fall off; do not pull them off.  No bandage is needed on the site.  DO  NOT apply any creams, oils, or ointments to the wound area.   If you notice any drainage or discharge from the wound, any swelling or bruising at the site, or you develop a fever > 101? F after you are discharged home, call the office at once.  Special Instructions   You are still able to use cellular telephones; use the ear opposite the side where you have your pacemaker/defibrillator.  Avoid carrying your cellular phone near your device.   When traveling through airports, show security personnel your identification card to avoid being screened in the metal detectors.  Ask the security personnel to use the hand wand.   Avoid arc welding equipment, MRI testing (magnetic resonance imaging), TENS units (transcutaneous nerve stimulators).  Call the office for questions about other devices.   Avoid electrical appliances that are in poor condition or are not properly grounded.   Microwave ovens are safe to be near or to operate.  Additional information for defibrillator patients should your device go off:   If your device goes off ONCE and you feel fine afterward, notify the device clinic nurses.   If your device goes off ONCE and you do not feel well afterward, call 911.   If your device goes off  TWICE, call 911.   If your device goes off THREE times in one day, call 911.  DO NOT DRIVE YOURSELF OR A FAMILY MEMBER WITH A DEFIBRILLATOR TO THE HOSPITAL--CALL 911.

## 2012-11-10 NOTE — H&P (View-Only) (Signed)
 Primary Care Physician: SCHULTZ,DOUGLAS E, MD Referring Physician:  Dr Bensimhon   [name] is a 56 y.o. female with a h/o nonischemic cardiomyopathy, NYHA Class III CHF, and prior breast cancer who presents for EP consultation regarding risks of sudden death.  She reports that she that she was diagnosed with breast cancer in 2006.  She underwent lumpectomy, XRT, and radiation at that time.  She reports that she began having CHF at that time.  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   He has been managed medically since that time.  Cardiac MRI in July 2010 EF 45%. Myoview in 10/11 EF 59%. No ischemia.  She was admitted 08/2011 for ADHF after she had run out medications for 2 weeks. She responded well to IV diuretics.  Echo 08/12/11: Left ventricle: mildly dilated, EF 20% to 25%. Diffuse hypokinesis. Doppler parameters are consistent with restrictive physiology, indicative of decreased left ventricular diastolic compliance and/or increased left atrial pressure. Left atrium was moderately dilated. Right atrium mildly dilated. Echo 10/08/12 revealed that her EF remains 20-25% despite treatment with an optimal medical regimen.   Presently, she reports SOB with moderate activity.  She reports DOE with greater than 100 ft.   Today, she denies symptoms of palpitations, shortness of breath, orthopnea, PND, lower extremity edema, dizziness, presyncope, syncope, or neurologic sequela. The patient is tolerating medications without difficulties and is otherwise without complaint today.   Past Medical History  Diagnosis Date  . 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 DYSLIPIDEMIA 05/01/2009  . OBESITY 07/24/2007  . OBSTRUCTIVE SLEEP APNEA 07/24/2007    with CPAP compliance  . RESTLESS LEG SYNDROME 07/24/2007  . SYSTOLIC HEART FAILURE, CHRONIC 01/05/2009  . TACHYCARDIA 10/25/2008  . Palpitations 09/20/2010  . 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 Hypertension   . CHF (congestive heart failure)      due to NICM (though secondary to chemo) cath 3/09 EF 45% minimal nonobstructive CAD. Previous EF 25%. ECHO 6/08 EF 55% ECHO 5/10 EF 30-35% MRI 7/10 EF 45% Myoview 10/11 59% no ischemia  . History of breast cancer 2006    with return in 2008, s/p mastectomy, XRT and chemotherapy  . Osteoarthritis   . Anxiety   . Depression     followed by a psychiatrist  . Asthma   . GERD (gastroesophageal reflux disease)   . Gastroparesis   . IBS (irritable bowel syndrome)    Past Surgical History  Procedure Laterality Date  . Mastectomy      right  . Abdominal hysterectomy  1991    Current Outpatient Prescriptions  Medication Sig Dispense Refill  . acetaminophen (TYLENOL ARTHRITIS PAIN) 650 MG CR tablet Take 1,300 mg by mouth every 8 (eight) hours as needed. pain      . albuterol (PROVENTIL HFA) 108 (90 BASE) MCG/ACT inhaler Inhale 2 puffs into the lungs every 6 (six) hours as needed. For shortness of breath      . atorvastatin (LIPITOR) 40 MG tablet Take 40 mg by mouth daily.        . busPIRone (BUSPAR) 30 MG tablet Take 30 mg by mouth 2 (two) times daily.       . carvedilol (COREG) 12.5 MG tablet Take 1 tablet (12.5 mg total) by mouth 2 (two) times daily with a meal.  60 tablet  11  . citalopram (CELEXA) 20 MG tablet Take 40 mg by mouth daily.       . cyclobenzaprine (FLEXERIL) 10 MG tablet Take 10 mg   by mouth 2 (two) times daily as needed for muscle spasms. For pain      . digoxin (LANOXIN) 0.125 MG tablet Take 1 tablet (125 mcg total) by mouth daily.  30 tablet  6  . diphenhydramine-acetaminophen (TYLENOL PM) 25-500 MG TABS Take 1 tablet by mouth at bedtime as needed. For insomnia      . furosemide (LASIX) 20 MG tablet Take 60 mg by mouth 2 (two) times daily.      . Glucosamine 500 MG CAPS Take 1 capsule by mouth daily.        . insulin lispro protamine-insulin lispro (HUMALOG 75/25) (75-25) 100 UNIT/ML SUSP 95 units with breakfast, and 95 units with the evening meal.      . Insulin Pen  Needle (B-D ULTRAFINE III SHORT PEN) 31G X 8 MM MISC 18/day as directed dx 250.00  500 each  11  . levothyroxine (SYNTHROID, LEVOTHROID) 75 MCG tablet Take 75 mcg by mouth daily.        . loperamide (IMODIUM A-D) 2 MG tablet Take 2 mg by mouth as needed. diarrhea      . mometasone (NASONEX) 50 MCG/ACT nasal spray Place 2 sprays into the nose daily as needed. allergies      . nitroGLYCERIN (NITROSTAT) 0.4 MG SL tablet Place 1 tablet (0.4 mg total) under the tongue every 5 (five) minutes as needed. For chest pain  25 tablet  3  . omega-3 acid ethyl esters (LOVAZA) 1 G capsule Take 4 capsules by mouth daily.        . pantoprazole (PROTONIX) 40 MG tablet Take 40 mg by mouth 2 (two) times daily.        . potassium chloride SA (K-DUR,KLOR-CON) 20 MEQ tablet Take 60 mEq by mouth 2 (two) times daily.       . ramipril (ALTACE) 10 MG tablet Take 1 tablet (10 mg total) by mouth at bedtime.  30 tablet  6  . rOPINIRole (REQUIP) 1 MG tablet Take 1 mg by mouth at bedtime.        . spironolactone (ALDACTONE) 25 MG tablet Take 1 tablet (25 mg total) by mouth daily.  30 tablet  6  . SUMAtriptan (IMITREX) 25 MG tablet Take 25 mg by mouth every 2 (two) hours as needed. For migraine      . tiotropium (SPIRIVA) 18 MCG inhalation capsule Place 18 mcg into inhaler and inhale daily.        . traMADol (ULTRAM) 50 MG tablet Take 50 mg by mouth every 6 (six) hours as needed. pain       No current facility-administered medications for this visit.    Allergies  Allergen Reactions  . Aspirin Hives  . Ciprofloxacin Hives  . Codeine Nausea Only  . Naproxen Hives  . Sulfonamide Derivatives Hives  . Tape Rash    Also allergic to metal    History   Social History  . Marital Status: Divorced    Spouse Name: N/A    Number of Children: N/A  . Years of Education: N/A   Occupational History  . Disabled    Social History Main Topics  . Smoking status: Former Smoker  . Smokeless tobacco: Former User    Quit date:  03/05/2001  . Alcohol Use: No  . Drug Use: No  . Sexually Active: Not on file   Other Topics Concern  . Not on file   Social History Narrative   Lives with mother and brother [place].     Regular exercise-no    Family History  Problem Relation Age of Onset  . Diabetes Mother   . Hypertension Other   . Coronary artery disease Father 61    ROS- All systems are reviewed and negative except as per the HPI above  Physical Exam: Filed Vitals:   11/04/12 1356  BP: 92/50  Pulse: 107  Height: 5' 5" (1.651 m)  Weight: 221 lb 4 oz (100.358 kg)    GEN- The patient is well appearing, alert and oriented x 3 today.   Head- normocephalic, atraumatic Eyes-  Sclera clear, conjunctiva pink Ears- hearing intact Oropharynx- clear Neck- supple, no JVP Lymph- no cervical lymphadenopathy Lungs- Clear to ausculation bilaterally, normal work of breathing Heart- Regular rate and rhythm, no murmurs, rubs or gallops, PMI not laterally displaced GI- soft, NT, ND, + BS Extremities- no clubbing, cyanosis, or edema MS- no significant deformity or atrophy Skin- no rash or lesion Psych- euthymic mood, full affect Neuro- strength and sensation are intact  EKG today reveals sinus tachycardia 107 bpm, QRS 100 msec, LAD, poor R wave progression Echo 10/08/12- EF 20-25%  Assessment and Plan:  

## 2012-11-10 NOTE — Progress Notes (Signed)
ICD Criteria  Current LVEF (within 6 months):20%  NYHA Functional Classification: Class III  Heart Failure History:  Yes, Duration of heart failure since onset is > 9 months  Non-Ischemic Dilated Cardiomyopathy History:  Yes, timeframe is > 9 months  Atrial Fibrillation/Atrial Flutter:  No.  Ventricular Tachycardia History:  No.  Cardiac Arrest History:  No  History of Syndromes with Risk of Sudden Death:  No.  Previous ICD:  No.  Electrophysiology Study: No.  Anticoagulation Therapy:  Patient is NOT on anticoagulation therapy.   Beta Blocker Therapy:  Yes.   Ace Inhibitor/ARB Therapy:  Yes. 

## 2012-11-10 NOTE — Interval H&P Note (Signed)
History and Physical Interval Note:  11/10/2012 7:46 AM  [name]  has presented today for surgery, with the diagnosis of CHF  The various methods of treatment have been discussed with the patient and family. After consideration of risks, benefits and other options for treatment, the patient has consented to  ICD implantation as a surgical intervention .  The patient's history has been reviewed, patient examined, no change in status, stable for surgery.  I have reviewed the patient's chart and labs.  Questions were answered to the patient's satisfaction.     Hillis Range

## 2012-11-11 ENCOUNTER — Ambulatory Visit (HOSPITAL_COMMUNITY): Payer: Medicare Other

## 2012-11-11 DIAGNOSIS — I5022 Chronic systolic (congestive) heart failure: Secondary | ICD-10-CM

## 2012-11-11 LAB — BASIC METABOLIC PANEL
BUN: 18 mg/dL (ref 6–23)
CO2: 29 mEq/L (ref 19–32)
Calcium: 9.9 mg/dL (ref 8.4–10.5)
Creatinine, Ser: 0.69 mg/dL (ref 0.50–1.10)
Glucose, Bld: 305 mg/dL — ABNORMAL HIGH (ref 70–99)

## 2012-11-11 LAB — GLUCOSE, CAPILLARY: Glucose-Capillary: 227 mg/dL — ABNORMAL HIGH (ref 70–99)

## 2012-11-11 MED ORDER — YOU HAVE A PACEMAKER BOOK
Freq: Once | Status: AC
  Administered 2012-11-11: 04:00:00
  Filled 2012-11-11: qty 1

## 2012-11-11 MED ORDER — CARVEDILOL 12.5 MG PO TABS: 25.0000 mg | ORAL_TABLET | Freq: Two times a day (BID) | ORAL | Status: DC

## 2012-11-11 NOTE — Discharge Summary (Signed)
ELECTROPHYSIOLOGY DISCHARGE SUMMARY    Patient ID: [name],  MRN: [id number], DOB/AGE: [birth year] 57 y.o.  Admit date: 11/10/2012 Discharge date: 11/11/2012  Primary Care Physician: Foye Deer, MD Primary Cardiologist: Gala Romney, MD  Primary Discharge Diagnosis:  1. NICM, EF 20%, s/p ICD implantation for primary prevention SCD  Secondary Discharge Diagnoses:  1. Chronic systolic HF  Procedures This Admission:  1. Single chamber ICD implantation 11/10/2012  RV lead - [employer] [place], model 4098J-19 (serial number [id number]) right ventricular defibrillator lead  Device - [employer] Fort[employer]ura model ZH0865-78I (serial Number [id number]) ICD  History and Hospital Course:  [name] is a 57 year old woman with a nonischemic CM (EF 20%) and NYHA Class III CHF. At this time, she meets SCD-HeFT criteria for ICD implantation for primary prevention of sudden death. The patient has a narrow QRS and does not meet criteria for revascularization. The patient has been treated with an optimal medical regimen but continues to have a depressed ejection fraction and NYHA Class III CHF symptoms. She therefore presented yesterday for ICD implantation. [name] tolerated this procedure well without any immediate complication. She remains hemodynamically stable and afebrile. Her chest xray shows stable lead placement without pneumothorax. Her device interrogation shows normal ICD function with stable lead parameters/measurements. Her implant site is intact without significant bleeding or hematoma. She has been given discharge instructions including wound care and activity restrictions. She will follow-up in 10 days for wound check. There were no changes made to her medications. She has been seen, examined and deemed stable for discharge today by Dr. Hillis Range.  Physical Exam:  Vitals: Blood pressure 103/49, pulse 101, temperature 97.7 F (36.5 C), temperature source  Oral, resp. rate 20, height 5\' 5"  (1.651 m), weight 220 lb 7.4 oz (100 kg), SpO2 93.00%.  General: WD, well appearing 57 year old female in no acute distress. Heart: RRR. S1, S2 without murmur, rub or S3.  Lungs: CTA bilaterally. No wheezes, rales or rhonchi. Extremities: No cyanosis, clubbing or edema.  Skin: Left upper chest/implant site intact without bleeding or hematoma.  Labs: Lab Results  Component Value Date   WBC 9.8 11/10/2012   HGB 12.5 11/10/2012   HCT 36.3 11/10/2012   MCV 84.0 11/10/2012   PLT 298 11/10/2012     Recent Labs Lab 11/04/12 1453  NA 137  K 3.6  CL 96  CO2 31  BUN 15  CREATININE 0.8  CALCIUM 9.4  GLUCOSE 132*    Disposition:  The patient is being discharged in stable condition.  Follow-up:     Follow-up Information   Follow up with [facility] On 11/19/2012. (At 9:30 AM for wound check)    Contact information:   [address] [place] [address] [phone]      Follow up with [facility] On 12/09/2012. (At 10:15 AM for HF follow-up)    Contact information:   [address] [place] [address] [phone]      Follow up with Hillis Range, MD In 3 months. (Our office will schedule)    Contact information:   [address] [place] [address] [phone]      Discharge Medications:    Medication List    TAKE these medications       atorvastatin 40 MG tablet  Commonly known as:  LIPITOR  Take 40 mg by mouth daily.  busPIRone 30 MG tablet  Commonly known as:  BUSPAR  Take 30 mg by mouth 2 (two) times daily.     carvedilol 12.5 MG tablet  Commonly known as:  COREG  Take 2 tablets (25 mg total) by mouth 2 (two) times daily with a meal.     citalopram 40 MG tablet  Commonly known as:  CELEXA  Take 40 mg by mouth daily.     cyclobenzaprine 10 MG tablet  Commonly known as:  FLEXERIL  Take 10 mg by mouth 2 (two) times daily. For pain      digoxin 0.125 MG tablet  Commonly known as:  LANOXIN  Take 1 tablet (125 mcg total) by mouth daily.     diphenhydramine-acetaminophen 25-500 MG Tabs  Commonly known as:  TYLENOL PM  Take 1 tablet by mouth at bedtime. For insomnia     furosemide 20 MG tablet  Commonly known as:  LASIX  Take 3 tablets (60 mg total) by mouth 2 (two) times daily.     Glucosamine 500 MG Caps  Take 1 capsule by mouth daily.     insulin lispro protamine-lispro (75-25) 100 UNIT/ML Susp  Commonly known as:  HUMALOG MIX 75/25 KWIKPEN  105 units with breakfast, and 105 units with the evening meal.     Insulin Pen Needle 31G X 8 MM Misc  Commonly known as:  B-D ULTRAFINE III SHORT PEN  18/day as directed dx 250.00     levothyroxine 75 MCG tablet  Commonly known as:  SYNTHROID, LEVOTHROID  Take 75 mcg by mouth daily.     loperamide 2 MG tablet  Commonly known as:  IMODIUM A-D  Take 4 mg by mouth as needed for diarrhea or loose stools. diarrhea     mometasone 50 MCG/ACT nasal spray  Commonly known as:  NASONEX  Place 2 sprays into the nose daily. allergies     nitroGLYCERIN 0.4 MG SL tablet  Commonly known as:  NITROSTAT  Place 1 tablet (0.4 mg total) under the tongue every 5 (five) minutes as needed. For chest pain     omega-3 acid ethyl esters 1 G capsule  Commonly known as:  LOVAZA  Take 2 g by mouth 2 (two) times daily.     pantoprazole 40 MG tablet  Commonly known as:  PROTONIX  Take 40 mg by mouth 2 (two) times daily.     potassium chloride SA 20 MEQ tablet  Commonly known as:  K-DUR,KLOR-CON  Take 60 mEq by mouth 2 (two) times daily.     PROVENTIL HFA 108 (90 BASE) MCG/ACT inhaler  Generic drug:  albuterol  Inhale 2 puffs into the lungs every 6 (six) hours as needed. For shortness of breath     ramipril 10 MG tablet  Commonly known as:  ALTACE  Take 1 tablet (10 mg total) by mouth at bedtime.     rOPINIRole 1 MG tablet  Commonly known as:  REQUIP  Take 1 mg by mouth at  bedtime.     spironolactone 25 MG tablet  Commonly known as:  ALDACTONE  Take 1 tablet (25 mg total) by mouth daily.     SUMAtriptan 25 MG tablet  Commonly known as:  IMITREX  Take 25 mg by mouth every 2 (two) hours as needed. For migraine     tiotropium 18 MCG inhalation capsule  Commonly known as:  SPIRIVA  Place 18 mcg into inhaler and inhale daily.     traMADol 50 MG tablet  Commonly known as:  ULTRAM  Take 50 mg by mouth every 6 (six) hours as needed. pain     TYLENOL ARTHRITIS PAIN 650 MG CR tablet  Generic drug:  acetaminophen  Take 1,300 mg by mouth every 8 (eight) hours as needed. pain       Duration of Discharge Encounter: Greater than 30 minutes including physician time.  Limmie Patricia, PA-C 11/11/2012, 9:15 AM    Hillis Range, MD

## 2012-11-11 NOTE — Progress Notes (Signed)
Doing well s/p ICD Short afib yesterday afternoon postoperatively without recurrence cxr reveals stable lead, no ptx Interrogation reviewed and normal (see paper chart)   Increase coreg to 25mg  BID Routine wound care and follow-up Discharge to home

## 2012-11-12 ENCOUNTER — Telehealth: Payer: Self-pay | Admitting: Internal Medicine

## 2012-11-12 NOTE — Telephone Encounter (Signed)
**  TCM** spoke with patient who states she is doing well except that she did not sleep well last night due to placement of ICD.  Patient states the way she had to sleep caused her to have knee pain.  Patient states she has a history of arthritis so she took Tramadol one last pm and one this am for her pain since this is what she normally takes.  Patient denies questions or concerns; states she is taking all medications as prescribed and is aware of upcoming appointment on 4/17.

## 2012-11-19 ENCOUNTER — Ambulatory Visit (INDEPENDENT_AMBULATORY_CARE_PROVIDER_SITE_OTHER): Payer: Medicare Other | Admitting: *Deleted

## 2012-11-19 ENCOUNTER — Other Ambulatory Visit: Payer: Self-pay | Admitting: Internal Medicine

## 2012-11-19 DIAGNOSIS — R000 Tachycardia, unspecified: Secondary | ICD-10-CM

## 2012-11-19 DIAGNOSIS — I5023 Acute on chronic systolic (congestive) heart failure: Secondary | ICD-10-CM

## 2012-11-19 DIAGNOSIS — R570 Cardiogenic shock: Secondary | ICD-10-CM

## 2012-11-19 DIAGNOSIS — I498 Other specified cardiac arrhythmias: Secondary | ICD-10-CM

## 2012-11-19 DIAGNOSIS — I5022 Chronic systolic (congestive) heart failure: Secondary | ICD-10-CM

## 2012-11-19 LAB — ICD DEVICE OBSERVATION
BRDY-0002RV: 40 {beats}/min
DEV-0020ICD: NEGATIVE
DEVICE MODEL ICD: 7066421
TOT-0007: 2
TOT-0008: 0
TOT-0009: 1
TOT-0010: 2
TZON-0003SLOWVT: 330 ms
TZON-0004SLOWVT: 16
VENTRICULAR PACING ICD: 0 pct
VF: 0

## 2012-11-19 NOTE — Progress Notes (Signed)
Wound check-ICD 

## 2012-12-09 ENCOUNTER — Ambulatory Visit (HOSPITAL_COMMUNITY)
Admission: RE | Admit: 2012-12-09 | Discharge: 2012-12-09 | Disposition: A | Discharge status: Home / Self Care | Payer: Medicare Other | Source: Ambulatory Visit | Attending: Internal Medicine | Admitting: Internal Medicine

## 2012-12-09 VITALS — BP 112/56 | HR 105 | Wt 226.5 lb

## 2012-12-09 DIAGNOSIS — I5022 Chronic systolic (congestive) heart failure: Secondary | ICD-10-CM | POA: Insufficient documentation

## 2012-12-09 DIAGNOSIS — E669 Obesity, unspecified: Secondary | ICD-10-CM | POA: Insufficient documentation

## 2012-12-09 MED ORDER — METOLAZONE 2.5 MG PO TABS: ORAL_TABLET | ORAL | Status: DC

## 2012-12-09 NOTE — Assessment & Plan Note (Signed)
Have discussed better eating habits and portion control.  She says she is going to start working on this.

## 2012-12-09 NOTE — Progress Notes (Signed)
PCP: Dr Everardo All   HPI [name] is a 57 year old female with history of breast cancer, obesity, HTN, diabetes, IBS, and OSA. She had chemo induced cardiomyopathy.  Her EF has fallen from 45% per Cardiac MRI in July 2010 to 20-25% in 10/2012.  Myoview in 05/2010 EF 59%. No ischemia.   Echo 08/12/11: Left ventricle: mildly dilated, EF 20% to 25%. Diffuse hypokinesis. Doppler parameters are consistent with restrictive physiology, indicative of decreased left ventricular diastolic compliance and/or increased left atrial pressure. Left atrium was moderately dilated. Right atrium mildly dilated.  Admitted in June 2013 with cardiogenic shock. Required inotropes. Discharge weight 213 pounds  RHC on 01/30/12 showed decompensated HF with low cardiac output consistent with cardiogenic shock.  RHC 6/27  RA = 20  RV = 55/13/22  PA =56/34 (44)  PCW = 28  Fick cardiac output/index = 3.1/1.5  PVR = 5.3 Woods  FA sat = 91%  PA sat = 42%, 48%  SVC sat = 46%  04/09/12 ECHO EF 30-35% LV mildly dilated  Hyopkinesis 10/07/12 ECHO EF 20-25% (viewed personally in clinic) RV ok.  She returns for follow up.  She feels well today.  Has a sore throat but feels it is from nasal drainage.  Has good and bad days.  She has been eating out a lot and when sugars spike she is drinking a lot of fluid.  Taking furosemide as prescribed but has not taken extra.  Weight at home is 217-224.  Occ dizziness with standing.  Says she almost fell out of tub this morning due to dizziness.  S/p single chamber ICD implantation on 11/10/12 by Dr. Johney Frame.       ROS: All systems negative except as listed in HPI, PMH and Problem List.  Past Medical History  Diagnosis Date  . 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 DYSLIPIDEMIA 05/01/2009  . OBESITY 07/24/2007  . OBSTRUCTIVE SLEEP APNEA 07/24/2007    with CPAP compliance  . RESTLESS LEG SYNDROME 07/24/2007  . SYSTOLIC HEART FAILURE, CHRONIC 01/05/2009  . TACHYCARDIA 10/25/2008  . Palpitations 09/20/2010  .  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 Hypertension   . 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 Previous EF 25%. ECHO 6/08 EF 55% ECHO 5/10 EF 30-35% MRI 7/10 EF 45% Myoview 10/11 59% no ischemia  . History of breast cancer 2006    with return in 2008, s/p mastectomy, XRT and chemotherapy  . Osteoarthritis   . Anxiety   . Depression     followed by a psychiatrist  . Asthma   . GERD (gastroesophageal reflux disease)   . Gastroparesis   . IBS (irritable bowel syndrome)   . Myocardial infarction   . Dysrhythmia     tachycardia  . ICD (implantable cardiac defibrillator) in place 11/10/2012    Dr Johney Frame  . COPD (chronic obstructive pulmonary disease)   . Headache   . Cancer     hx of in right breast    Current Outpatient Prescriptions  Medication Sig Dispense Refill  . acetaminophen (TYLENOL ARTHRITIS PAIN) 650 MG CR tablet Take 1,300 mg by mouth every 8 (eight) hours as needed. pain      . albuterol (PROVENTIL HFA) 108 (90 BASE) MCG/ACT inhaler Inhale 2 puffs into the lungs every 6 (six) hours as needed. For shortness of breath      . atorvastatin (LIPITOR) 40 MG tablet Take 40 mg by mouth daily.        Marland Kitchen  busPIRone (BUSPAR) 30 MG tablet Take 30 mg by mouth 2 (two) times daily.       . carvedilol (COREG) 12.5 MG tablet Take 2 tablets (25 mg total) by mouth 2 (two) times daily with a meal.  120 tablet  3  . citalopram (CELEXA) 40 MG tablet Take 40 mg by mouth daily.      . cyclobenzaprine (FLEXERIL) 10 MG tablet Take 10 mg by mouth 2 (two) times daily. For pain      . digoxin (LANOXIN) 0.125 MG tablet Take 1 tablet (125 mcg total) by mouth daily.  30 tablet  6  . diphenhydramine-acetaminophen (TYLENOL PM) 25-500 MG TABS Take 1 tablet by mouth at bedtime. For insomnia      . furosemide (LASIX) 20 MG tablet Take 3 tablets (60 mg total) by mouth 2 (two) times daily.  180 tablet  6  . Glucosamine 500 MG CAPS Take  1 capsule by mouth daily.        . insulin lispro protamine-lispro (HUMALOG MIX 75/25 KWIKPEN) (75-25) 100 UNIT/ML SUSP 105 units with breakfast, and 105 units with the evening meal.  60 mL  12  . Insulin Pen Needle (B-D ULTRAFINE III SHORT PEN) 31G X 8 MM MISC 18/day as directed dx 250.00  500 each  11  . levothyroxine (SYNTHROID, LEVOTHROID) 75 MCG tablet Take 75 mcg by mouth daily.        Marland Kitchen loperamide (IMODIUM A-D) 2 MG tablet Take 4 mg by mouth as needed for diarrhea or loose stools. diarrhea      . mometasone (NASONEX) 50 MCG/ACT nasal spray Place 2 sprays into the nose daily. allergies      . nitroGLYCERIN (NITROSTAT) 0.4 MG SL tablet Place 1 tablet (0.4 mg total) under the tongue every 5 (five) minutes as needed. For chest pain  25 tablet  3  . omega-3 acid ethyl esters (LOVAZA) 1 G capsule Take 2 g by mouth 2 (two) times daily.       . pantoprazole (PROTONIX) 40 MG tablet Take 40 mg by mouth 2 (two) times daily.        . potassium chloride SA (K-DUR,KLOR-CON) 20 MEQ tablet Take 60 mEq by mouth 2 (two) times daily.       . ramipril (ALTACE) 10 MG tablet Take 1 tablet (10 mg total) by mouth at bedtime.  30 tablet  6  . rOPINIRole (REQUIP) 1 MG tablet Take 1 mg by mouth at bedtime.        Marland Kitchen spironolactone (ALDACTONE) 25 MG tablet Take 1 tablet (25 mg total) by mouth daily.  30 tablet  6  . SUMAtriptan (IMITREX) 25 MG tablet Take 25 mg by mouth every 2 (two) hours as needed. For migraine      . tiotropium (SPIRIVA) 18 MCG inhalation capsule Place 18 mcg into inhaler and inhale daily.        . traMADol (ULTRAM) 50 MG tablet Take 50 mg by mouth every 6 (six) hours as needed. pain       No current facility-administered medications for this encounter.     PHYSICAL EXAM: Filed Vitals:   12/09/12 1028  BP: 112/56  Pulse: 105  Weight: 226 lb 8 oz (102.74 kg)  SpO2: 96%   General:  Obese, Well appearing. No resp difficulty HEENT: normal x for poor dentition Neck: supple. JVP hard to see  but appears elevated Carotids 2+ bilaterally; no bruits. No lymphadenopathy or thryomegaly appreciated. Cor: PMI normal. Tachycardic Regular  rate & rhythm. no S3. Lungs: clear Abdomen: soft, obese, nontender, mild distention. No hepatosplenomegaly. No bruits or masses. Good bowel sounds. Extremities: no cyanosis, clubbing, rash, tr edema.  Neuro: alert & orientedx3, cranial nerves grossly intact. Moves all 4 extremities w/o difficulty. Affect pleasant.    ASSESSMENT & PLAN:

## 2012-12-09 NOTE — Patient Instructions (Addendum)
Take metolazone 2.5 mg today and tomorrow.  Call if weight is not returning to baseline.   Follow up 1 month.  Do the following things EVERYDAY: 1) Weigh yourself in the morning before breakfast. Write it down and keep it in a log. 2) Take your medicines as prescribed 3) Eat low salt foods-Limit salt (sodium) to 2000 mg per day.  4) Stay as active as you can everyday 5) Limit all fluids for the day to less than 2 liters

## 2012-12-09 NOTE — Assessment & Plan Note (Addendum)
Volume status elevated due to noncompliance with fluid restrictions.  Will give 2 days of metolazone 2.5 mg and continue lasix 60 mg BID.  Have also discussed options for dry mouth that do not include drinking a lot of water, she states she will try the hard candies.  She is at good doses for HF medications.  No further titration due to dizziness.  Follow up in 3 weeks with BMET.

## 2012-12-11 ENCOUNTER — Other Ambulatory Visit: Payer: Self-pay | Admitting: Endocrinology

## 2012-12-21 ENCOUNTER — Other Ambulatory Visit: Payer: Self-pay

## 2012-12-21 MED ORDER — INSULIN LISPRO PROT & LISPRO (75-25 MIX) 100 UNIT/ML ~~LOC~~ SUSP: SUBCUTANEOUS | Status: DC

## 2013-01-06 ENCOUNTER — Ambulatory Visit (INDEPENDENT_AMBULATORY_CARE_PROVIDER_SITE_OTHER): Payer: Medicare Other | Admitting: Endocrinology

## 2013-01-06 ENCOUNTER — Encounter: Payer: Self-pay | Admitting: Internal Medicine

## 2013-01-06 ENCOUNTER — Encounter: Payer: Self-pay | Admitting: Endocrinology

## 2013-01-06 VITALS — BP 118/70 | HR 100 | Ht 66.0 in | Wt 229.0 lb

## 2013-01-06 DIAGNOSIS — E1049 Type 1 diabetes mellitus with other diabetic neurological complication: Secondary | ICD-10-CM

## 2013-01-06 DIAGNOSIS — E119 Type 2 diabetes mellitus without complications: Secondary | ICD-10-CM

## 2013-01-06 LAB — MICROALBUMIN / CREATININE URINE RATIO: Creatinine,U: 46.7 mg/dL

## 2013-01-06 MED ORDER — INSULIN LISPRO PROT & LISPRO (75-25 MIX) 100 UNIT/ML KWIKPEN: 125.0000 [IU] | PEN_INJECTOR | Freq: Two times a day (BID) | SUBCUTANEOUS | Status: DC

## 2013-01-06 NOTE — Patient Instructions (Addendum)
blood tests are being requested for you today.  We'll contact you with results. Please increase humalog 75/25 to 125 units 2x a day (with 1st and last meals of the day).  i have sent a prescription to your pharmacy.   Please make a follow-up appointment in 6 weeks.   check your blood sugar 2 times a day.  vary the time of day when you check, between before the 3 meals, and at bedtime.  also check if you have symptoms of your blood sugar being too high or too low.  please keep a record of the readings and bring it to your next appointment here.  please call us sooner if you are having low blood sugar episodes, or if it stays over 200.

## 2013-01-06 NOTE — Progress Notes (Signed)
Subjective:    Patient ID: [name], female    DOB: [birth year], 57 y.o.   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  she brings a record of her cbg's which i have reviewed today.  It varies from 200-400.  There is no trend throughout the day.   Past Medical History  Diagnosis Date  . 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 DYSLIPIDEMIA 05/01/2009  . OBESITY 07/24/2007  . OBSTRUCTIVE SLEEP APNEA 07/24/2007    with CPAP compliance  . RESTLESS LEG SYNDROME 07/24/2007  . SYSTOLIC HEART FAILURE, CHRONIC 01/05/2009  . TACHYCARDIA 10/25/2008  . Palpitations 09/20/2010  . 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 Hypertension   . 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 Previous EF 25%. ECHO 6/08 EF 55% ECHO 5/10 EF 30-35% MRI 7/10 EF 45% Myoview 10/11 59% no ischemia  . History of breast cancer 2006    with return in 2008, s/p mastectomy, XRT and chemotherapy  . Osteoarthritis   . Anxiety   . Depression     followed by a psychiatrist  . Asthma   . GERD (gastroesophageal reflux disease)   . Gastroparesis   . IBS (irritable bowel syndrome)   . Myocardial infarction   . Dysrhythmia     tachycardia  . ICD (implantable cardiac defibrillator) in place 11/10/2012    Dr Johney Frame  . COPD (chronic obstructive pulmonary disease)   . Headache(784.0)   . Cancer     hx of in right breast    Past Surgical History  Procedure Laterality Date  . Mastectomy      right  . Abdominal hysterectomy  1991  . Defibrilator  11/10/2012    Dr Johney Frame  . Cholecystectomy    . Wrist surgery Bilateral   . Knee cartilage surgery      History   Social History  . Marital Status: Divorced    Spouse Name: N/A    Number of Children: N/A  . Years of Education: N/A   Occupational  History  . Disabled    Social History Main Topics  . Smoking status: Former Smoker    Quit date: 04/12/2001  . Smokeless tobacco: Never Used  . Alcohol Use: No  . Drug Use: No  . Sexually Active: Not on file   Other Topics Concern  . Not on file   Social History Narrative   Lives with mother and brother Lalla Brothers.   Regular exercise-no    Current Outpatient Prescriptions on File Prior to Visit  Medication Sig Dispense Refill  . acetaminophen (TYLENOL ARTHRITIS PAIN) 650 MG CR tablet Take 1,300 mg by mouth every 8 (eight) hours as needed. pain      . albuterol (PROVENTIL HFA) 108 (90 BASE) MCG/ACT inhaler Inhale 2 puffs into the lungs every 6 (six) hours as needed. For shortness of breath      . atorvastatin (LIPITOR) 40 MG tablet Take 40 mg by mouth daily.        . busPIRone (BUSPAR) 30 MG tablet Take 30 mg by mouth 2 (two) times daily.       . carvedilol (COREG) 12.5 MG tablet Take 2 tablets (25 mg total) by mouth 2 (two) times daily with a meal.  120 tablet  3  . citalopram (CELEXA) 40  MG tablet Take 40 mg by mouth daily.      . cyclobenzaprine (FLEXERIL) 10 MG tablet Take 10 mg by mouth 2 (two) times daily. For pain      . digoxin (LANOXIN) 0.125 MG tablet Take 1 tablet (125 mcg total) by mouth daily.  30 tablet  6  . diphenhydramine-acetaminophen (TYLENOL PM) 25-500 MG TABS Take 1 tablet by mouth at bedtime. For insomnia      . furosemide (LASIX) 20 MG tablet Take 3 tablets (60 mg total) by mouth 2 (two) times daily.  180 tablet  6  . Glucosamine 500 MG CAPS Take 1 capsule by mouth daily.        . Insulin Pen Needle (B-D ULTRAFINE III SHORT PEN) 31G X 8 MM MISC 18/day as directed dx 250.00  500 each  11  . levothyroxine (SYNTHROID, LEVOTHROID) 75 MCG tablet Take 75 mcg by mouth daily.        Marland Kitchen loperamide (IMODIUM A-D) 2 MG tablet Take 4 mg by mouth as needed for diarrhea or loose stools. diarrhea      . metolazone (ZAROXOLYN) 2.5 MG tablet Take 2.5 mg as needed for weight  gain.  10 tablet  1  . mometasone (NASONEX) 50 MCG/ACT nasal spray Place 2 sprays into the nose daily. allergies      . nitroGLYCERIN (NITROSTAT) 0.4 MG SL tablet Place 1 tablet (0.4 mg total) under the tongue every 5 (five) minutes as needed. For chest pain  25 tablet  3  . NOVOFINE 30G X 8 MM MISC USE 18 TIMES DAILY AS DIRECTED.  500 each  6  . omega-3 acid ethyl esters (LOVAZA) 1 G capsule Take 2 g by mouth 2 (two) times daily.       . pantoprazole (PROTONIX) 40 MG tablet Take 40 mg by mouth 2 (two) times daily.        . potassium chloride SA (K-DUR,KLOR-CON) 20 MEQ tablet Take 60 mEq by mouth 2 (two) times daily.       . ramipril (ALTACE) 10 MG tablet Take 1 tablet (10 mg total) by mouth at bedtime.  30 tablet  6  . rOPINIRole (REQUIP) 1 MG tablet Take 1 mg by mouth at bedtime.        Marland Kitchen spironolactone (ALDACTONE) 25 MG tablet Take 1 tablet (25 mg total) by mouth daily.  30 tablet  6  . SUMAtriptan (IMITREX) 25 MG tablet Take 25 mg by mouth every 2 (two) hours as needed. For migraine      . tiotropium (SPIRIVA) 18 MCG inhalation capsule Place 18 mcg into inhaler and inhale daily.        . traMADol (ULTRAM) 50 MG tablet Take 50 mg by mouth every 6 (six) hours as needed. pain       No current facility-administered medications on file prior to visit.    Allergies  Allergen Reactions  . Aspirin Hives  . Ciprofloxacin Hives  . Codeine Nausea Only    "can take ONLY if she eats with med"  . Naproxen Hives  . Sulfonamide Derivatives Hives  . Tape Rash    Also allergic to metal    Family History  Problem Relation Age of Onset  . Diabetes Mother   . Hypertension Other   . Coronary artery disease Father 61   BP 118/70  Pulse 100  Ht 5\' 6"  (1.676 m)  Wt 229 lb (103.874 kg)  BMI 36.98 kg/m2  SpO2 95%  Review of Systems  denies hypoglycemia, but she has weight gain.    Objective:   Physical Exam VITAL SIGNS:  See vs page GENERAL: no distress  Lab Results  Component Value Date    HGBA1C 9.7* 01/06/2013      Assessment & Plan:  DM: she needs increased rx.  This insulin regimen was chosen from multiple options, for its simplicity.  The benefits of glycemic control must be weighed against the risks of hypoglycemia.

## 2013-01-18 ENCOUNTER — Ambulatory Visit (HOSPITAL_COMMUNITY)
Admission: RE | Admit: 2013-01-18 | Discharge: 2013-01-18 | Disposition: A | Discharge status: Home / Self Care | Payer: Medicare Other | Source: Ambulatory Visit | Attending: Internal Medicine | Admitting: Internal Medicine

## 2013-01-18 ENCOUNTER — Encounter (HOSPITAL_COMMUNITY): Payer: Self-pay

## 2013-01-18 ENCOUNTER — Telehealth: Payer: Self-pay | Admitting: Oncology

## 2013-01-18 VITALS — BP 89/47 | HR 99 | Wt 227.8 lb

## 2013-01-18 DIAGNOSIS — F329 Major depressive disorder, single episode, unspecified: Secondary | ICD-10-CM | POA: Insufficient documentation

## 2013-01-18 DIAGNOSIS — M1990 Unspecified osteoarthritis, unspecified site: Secondary | ICD-10-CM | POA: Insufficient documentation

## 2013-01-18 DIAGNOSIS — I10 Essential (primary) hypertension: Secondary | ICD-10-CM | POA: Insufficient documentation

## 2013-01-18 DIAGNOSIS — T451X5A Adverse effect of antineoplastic and immunosuppressive drugs, initial encounter: Secondary | ICD-10-CM | POA: Insufficient documentation

## 2013-01-18 DIAGNOSIS — E032 Hypothyroidism due to medicaments and other exogenous substances: Secondary | ICD-10-CM | POA: Insufficient documentation

## 2013-01-18 DIAGNOSIS — J45909 Unspecified asthma, uncomplicated: Secondary | ICD-10-CM | POA: Insufficient documentation

## 2013-01-18 DIAGNOSIS — E119 Type 2 diabetes mellitus without complications: Secondary | ICD-10-CM | POA: Insufficient documentation

## 2013-01-18 DIAGNOSIS — I5022 Chronic systolic (congestive) heart failure: Secondary | ICD-10-CM | POA: Insufficient documentation

## 2013-01-18 DIAGNOSIS — I428 Other cardiomyopathies: Secondary | ICD-10-CM | POA: Insufficient documentation

## 2013-01-18 DIAGNOSIS — J4489 Other specified chronic obstructive pulmonary disease: Secondary | ICD-10-CM | POA: Insufficient documentation

## 2013-01-18 DIAGNOSIS — K589 Irritable bowel syndrome without diarrhea: Secondary | ICD-10-CM | POA: Insufficient documentation

## 2013-01-18 DIAGNOSIS — E669 Obesity, unspecified: Secondary | ICD-10-CM | POA: Insufficient documentation

## 2013-01-18 DIAGNOSIS — F3289 Other specified depressive episodes: Secondary | ICD-10-CM | POA: Insufficient documentation

## 2013-01-18 DIAGNOSIS — F411 Generalized anxiety disorder: Secondary | ICD-10-CM | POA: Insufficient documentation

## 2013-01-18 DIAGNOSIS — G2581 Restless legs syndrome: Secondary | ICD-10-CM | POA: Insufficient documentation

## 2013-01-18 DIAGNOSIS — I509 Heart failure, unspecified: Secondary | ICD-10-CM | POA: Insufficient documentation

## 2013-01-18 DIAGNOSIS — Z95810 Presence of automatic (implantable) cardiac defibrillator: Secondary | ICD-10-CM | POA: Insufficient documentation

## 2013-01-18 DIAGNOSIS — Z79899 Other long term (current) drug therapy: Secondary | ICD-10-CM | POA: Insufficient documentation

## 2013-01-18 DIAGNOSIS — I252 Old myocardial infarction: Secondary | ICD-10-CM | POA: Insufficient documentation

## 2013-01-18 DIAGNOSIS — E785 Hyperlipidemia, unspecified: Secondary | ICD-10-CM | POA: Insufficient documentation

## 2013-01-18 DIAGNOSIS — G4733 Obstructive sleep apnea (adult) (pediatric): Secondary | ICD-10-CM | POA: Insufficient documentation

## 2013-01-18 DIAGNOSIS — J449 Chronic obstructive pulmonary disease, unspecified: Secondary | ICD-10-CM | POA: Insufficient documentation

## 2013-01-18 NOTE — Patient Instructions (Addendum)
Follow up 3 months.  Do the following things EVERYDAY: 1) Weigh yourself in the morning before breakfast. Write it down and keep it in a log. 2) Take your medicines as prescribed 3) Eat low salt foods-Limit salt (sodium) to 2000 mg per day.  4) Stay as active as you can everyday 5) Limit all fluids for the day to less than 2 liters  

## 2013-01-18 NOTE — Assessment & Plan Note (Addendum)
NYHA II. Volume status stable. Continue current diuretic regimen. Reinforced daily weights, low salt food choices, and limiting fluid intake to < 2 liters per day. Follow up in 3 months.

## 2013-01-18 NOTE — Progress Notes (Signed)
Patient ID: [name], female   DOB: 11/03/1955, 57 y.o.   MRN: [id number] PCP: Dr Everardo All   HPI [name] is a 57 year old female with history of breast cancer, obesity, HTN, diabetes, IBS, and OSA. She had chemo induced cardiomyopathy.  Her EF has fallen from 45% per Cardiac MRI in July 2010 to 20-25% in 10/2012. S/P single chamber ICD ([employer]) implantation on 11/10/12 by Dr. Johney Frame.     Myoview in 05/2010 EF 59%. No ischemia.   Echo 08/12/11: Left ventricle: mildly dilated, EF 20% to 25%. Diffuse hypokinesis. Doppler parameters are consistent with restrictive physiology, indicative of decreased left ventricular diastolic compliance and/or increased left atrial pressure. Left atrium was moderately dilated. Right atrium mildly dilated.  Admitted in June 2013 with cardiogenic shock. Required inotropes. Discharge weight 213 pounds  RHC on 01/30/12 showed decompensated HF with low cardiac output consistent with cardiogenic shock.  RHC 6/27  RA = 20  RV = 55/13/22  PA =56/34 (44)  PCW = 28  Fick cardiac output/index = 3.1/1.5  PVR = 5.3 Woods  FA sat = 91%  PA sat = 42%, 48%  SVC sat = 46%  04/09/12 ECHO EF 30-35% LV mildly dilated  Hyopkinesis 10/07/12 ECHO EF 20-25% (viewed personally in clinic) RV ok.  She returns for follow up.  Overall she feels ok. Denies SOB/PND/Orthopnea/Dizziness. Weight at home 218-219 pounds. She has had one Metolazone last week.  Compliant with medications. Says she has a big appetite. Last night she had a frozen dinner. Eating high salt foods. Drinking > 2 liters.     ROS: All systems negative except as listed in HPI, PMH and Problem List.  Past Medical History  Diagnosis Date  . 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 DYSLIPIDEMIA 05/01/2009  . OBESITY 07/24/2007  . OBSTRUCTIVE SLEEP APNEA 07/24/2007    with CPAP compliance  . RESTLESS LEG SYNDROME 07/24/2007  . SYSTOLIC HEART FAILURE, CHRONIC 01/05/2009  . TACHYCARDIA 10/25/2008  . Palpitations 09/20/2010  .  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 Hypertension   . 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 Previous EF 25%. ECHO 6/08 EF 55% ECHO 5/10 EF 30-35% MRI 7/10 EF 45% Myoview 10/11 59% no ischemia  . History of breast cancer 2006    with return in 2008, s/p mastectomy, XRT and chemotherapy  . Osteoarthritis   . Anxiety   . Depression     followed by a psychiatrist  . Asthma   . GERD (gastroesophageal reflux disease)   . Gastroparesis   . IBS (irritable bowel syndrome)   . Myocardial infarction   . Dysrhythmia     tachycardia  . ICD (implantable cardiac defibrillator) in place 11/10/2012    Dr Johney Frame  . COPD (chronic obstructive pulmonary disease)   . Headache(784.0)   . Cancer     hx of in right breast    Current Outpatient Prescriptions  Medication Sig Dispense Refill  . acetaminophen (TYLENOL ARTHRITIS PAIN) 650 MG CR tablet Take 1,300 mg by mouth every 8 (eight) hours as needed. pain      . albuterol (PROVENTIL HFA) 108 (90 BASE) MCG/ACT inhaler Inhale 2 puffs into the lungs every 6 (six) hours as needed. For shortness of breath      . atorvastatin (LIPITOR) 40 MG tablet Take 40 mg by mouth daily.        . busPIRone (BUSPAR) 30 MG tablet Take  30 mg by mouth 2 (two) times daily.       . carvedilol (COREG) 12.5 MG tablet Take 2 tablets (25 mg total) by mouth 2 (two) times daily with a meal.  120 tablet  3  . citalopram (CELEXA) 40 MG tablet Take 40 mg by mouth daily.      . cyclobenzaprine (FLEXERIL) 10 MG tablet Take 10 mg by mouth 2 (two) times daily. For pain      . digoxin (LANOXIN) 0.125 MG tablet Take 1 tablet (125 mcg total) by mouth daily.  30 tablet  6  . diphenhydramine-acetaminophen (TYLENOL PM) 25-500 MG TABS Take 1 tablet by mouth at bedtime. For insomnia      . furosemide (LASIX) 20 MG tablet Take 3 tablets (60 mg total) by mouth 2 (two) times daily.  180 tablet  6  . Glucosamine 500 MG  CAPS Take 1 capsule by mouth daily.        . Insulin Lispro Prot & Lispro (HUMALOG MIX 75/25 KWIKPEN) (75-25) 100 UNIT/ML SUPN Inject 125 Units into the skin 2 (two) times daily with a meal.  30 pen  11  . Insulin Pen Needle (B-D ULTRAFINE III SHORT PEN) 31G X 8 MM MISC 18/day as directed dx 250.00  500 each  11  . levothyroxine (SYNTHROID, LEVOTHROID) 75 MCG tablet Take 75 mcg by mouth daily.        Marland Kitchen loperamide (IMODIUM A-D) 2 MG tablet Take 4 mg by mouth as needed for diarrhea or loose stools. diarrhea      . mometasone (NASONEX) 50 MCG/ACT nasal spray Place 2 sprays into the nose daily. allergies      . nitroGLYCERIN (NITROSTAT) 0.4 MG SL tablet Place 1 tablet (0.4 mg total) under the tongue every 5 (five) minutes as needed. For chest pain  25 tablet  3  . NOVOFINE 30G X 8 MM MISC USE 18 TIMES DAILY AS DIRECTED.  500 each  6  . omega-3 acid ethyl esters (LOVAZA) 1 G capsule Take 2 g by mouth 2 (two) times daily.       . pantoprazole (PROTONIX) 40 MG tablet Take 40 mg by mouth 2 (two) times daily.        . potassium chloride SA (K-DUR,KLOR-CON) 20 MEQ tablet Take 60 mEq by mouth 2 (two) times daily.       . ramipril (ALTACE) 10 MG tablet Take 1 tablet (10 mg total) by mouth at bedtime.  30 tablet  6  . rOPINIRole (REQUIP) 1 MG tablet Take 1 mg by mouth at bedtime.        Marland Kitchen spironolactone (ALDACTONE) 25 MG tablet Take 1 tablet (25 mg total) by mouth daily.  30 tablet  6  . SUMAtriptan (IMITREX) 25 MG tablet Take 25 mg by mouth every 2 (two) hours as needed. For migraine      . tiotropium (SPIRIVA) 18 MCG inhalation capsule Place 18 mcg into inhaler and inhale daily.        . traMADol (ULTRAM) 50 MG tablet Take 50 mg by mouth every 6 (six) hours as needed. pain      . metolazone (ZAROXOLYN) 2.5 MG tablet Take 2.5 mg as needed for weight gain.  10 tablet  1   No current facility-administered medications for this encounter.     PHYSICAL EXAM: Filed Vitals:   01/18/13 0911  BP: 89/47  Pulse:  99  Weight: 227 lb 12.8 oz (103.329 kg)  SpO2: 96%   General:  Obese, Well appearing. No resp difficulty HEENT: normal except for poor dentition Neck: supple. JVP hard to see but does not appear elevated. Carotids 2+ bilaterally; no bruits. No lymphadenopathy or thryomegaly appreciated. Cor: PMI normal. Regular rate & rhythm. no S3. Lungs: clear Abdomen: soft, obese, nontender, mild distention. No hepatosplenomegaly. No bruits or masses. Good bowel sounds. Extremities: no cyanosis, clubbing, rash, tr edema.  Neuro: alert & orientedx3, cranial nerves grossly intact. Moves all 4 extremities w/o difficulty. Affect pleasant.    ASSESSMENT & PLAN:

## 2013-01-18 NOTE — Telephone Encounter (Signed)
pt camed by to r/s 1 year f/u with new Dr...Marland KitchenMarland KitchenDone

## 2013-02-02 ENCOUNTER — Other Ambulatory Visit: Payer: Self-pay

## 2013-02-02 MED ORDER — DIGOXIN 125 MCG PO TABS: 125.0000 ug | ORAL_TABLET | Freq: Every day | ORAL | Status: DC

## 2013-02-04 ENCOUNTER — Other Ambulatory Visit: Payer: Self-pay | Admitting: Endocrinology

## 2013-02-10 ENCOUNTER — Ambulatory Visit (INDEPENDENT_AMBULATORY_CARE_PROVIDER_SITE_OTHER): Payer: Medicare Other | Admitting: Internal Medicine

## 2013-02-10 ENCOUNTER — Encounter: Payer: Self-pay | Admitting: Internal Medicine

## 2013-02-10 VITALS — BP 110/68 | HR 100 | Ht 65.0 in | Wt 221.8 lb

## 2013-02-10 DIAGNOSIS — I498 Other specified cardiac arrhythmias: Secondary | ICD-10-CM

## 2013-02-10 DIAGNOSIS — R000 Tachycardia, unspecified: Secondary | ICD-10-CM

## 2013-02-10 DIAGNOSIS — I5022 Chronic systolic (congestive) heart failure: Secondary | ICD-10-CM

## 2013-02-10 LAB — ICD DEVICE OBSERVATION
BRDY-0002RV: 40 {beats}/min
DEVICE MODEL ICD: 7066421
HV IMPEDENCE: 87 Ohm
PACEART VT: 0
TOT-0007: 2
TOT-0010: 2
TZON-0003SLOWVT: 330 ms
VENTRICULAR PACING ICD: 0.1 pct
VF: 0

## 2013-02-10 NOTE — Patient Instructions (Signed)
Your physician wants you to follow-up in:  9 MONTHS WITH DR [facility]  AND [facility]  IN October   You will receive a reminder letter in the mail two months in advance. If you don't receive a letter, please call our office to schedule the follow-up appointment. Your physician recommends that you continue on your current medications as directed. Please refer to the Current Medication list given to you today.

## 2013-02-10 NOTE — Progress Notes (Signed)
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 female who presents today for routine electrophysiology followup.  Since her ICD implantation, the patient reports doing very well.  Today, she denies symptoms of palpitations, chest pain, shortness of breath,  lower extremity edema, dizziness, presyncope, syncope, or ICD shocks.  The patient is otherwise without complaint today.   Past Medical History  Diagnosis Date  . 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 DYSLIPIDEMIA 05/01/2009  . OBESITY 07/24/2007  . OBSTRUCTIVE SLEEP APNEA 07/24/2007    with CPAP compliance  . RESTLESS LEG SYNDROME 07/24/2007  . SYSTOLIC HEART FAILURE, CHRONIC 01/05/2009  . TACHYCARDIA 10/25/2008  . Palpitations 09/20/2010  . 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 Hypertension   . 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 Previous EF 25%. ECHO 6/08 EF 55% ECHO 5/10 EF 30-35% MRI 7/10 EF 45% Myoview 10/11 59% no ischemia  . History of breast cancer 2006    with return in 2008, s/p mastectomy, XRT and chemotherapy  . Osteoarthritis   . Anxiety   . Depression     followed by a psychiatrist  . Asthma   . GERD (gastroesophageal reflux disease)   . Gastroparesis   . IBS (irritable bowel syndrome)   . Myocardial infarction   . Dysrhythmia     tachycardia  . ICD (implantable cardiac defibrillator) in place 11/10/2012    Dr Johney Frame  . COPD (chronic obstructive pulmonary disease)   . Headache(784.0)   . Cancer     hx of in right breast   Past Surgical History  Procedure Laterality Date  . Mastectomy      right  . Abdominal hysterectomy  1991  . Cardiac defibrillator placement  11/10/2012    SJM Fortify Assura VR implanted by Dr Johney Frame for primary prevention  . Cholecystectomy    . Wrist surgery Bilateral   . Knee cartilage surgery      Current Outpatient Prescriptions  Medication Sig Dispense Refill  .  ACCU-CHEK FASTCLIX LANCETS MISC       . ACCU-CHEK SMARTVIEW test strip       . acetaminophen (TYLENOL ARTHRITIS PAIN) 650 MG CR tablet Take 1,300 mg by mouth every 8 (eight) hours as needed. pain      . albuterol (PROVENTIL HFA) 108 (90 BASE) MCG/ACT inhaler Inhale 2 puffs into the lungs every 6 (six) hours as needed. For shortness of breath      . atorvastatin (LIPITOR) 40 MG tablet Take 40 mg by mouth daily.        . busPIRone (BUSPAR) 30 MG tablet Take 30 mg by mouth 2 (two) times daily.       . carvedilol (COREG) 12.5 MG tablet Take 2 tablets (25 mg total) by mouth 2 (two) times daily with a meal.  120 tablet  3  . citalopram (CELEXA) 40 MG tablet Take 40 mg by mouth daily.      . cyclobenzaprine (FLEXERIL) 10 MG tablet Take 10 mg by mouth 2 (two) times daily. For pain      . digoxin (LANOXIN) 0.125 MG tablet Take 1 tablet (125 mcg total) by mouth daily.  30 tablet  6  . diphenhydramine-acetaminophen (TYLENOL PM) 25-500 MG TABS Take 1 tablet by mouth at bedtime. For insomnia      . furosemide (LASIX) 20 MG tablet Take 3 tablets (60 mg total) by mouth 2 (  two) times daily.  180 tablet  6  . Glucosamine 500 MG CAPS Take 1 capsule by mouth daily.        . Insulin Lispro Prot & Lispro (HUMALOG MIX 75/25 KWIKPEN) (75-25) 100 UNIT/ML SUPN Inject 125 Units into the skin 2 (two) times daily with a meal.  30 pen  11  . Insulin Pen Needle (B-D ULTRAFINE III SHORT PEN) 31G X 8 MM MISC 18/day as directed dx 250.00  500 each  11  . levothyroxine (SYNTHROID, LEVOTHROID) 75 MCG tablet Take 75 mcg by mouth daily.        Marland Kitchen loperamide (IMODIUM A-D) 2 MG tablet Take 4 mg by mouth as needed for diarrhea or loose stools. diarrhea      . metolazone (ZAROXOLYN) 2.5 MG tablet Take 2.5 mg as needed for weight gain.  10 tablet  1  . mometasone (NASONEX) 50 MCG/ACT nasal spray Place 2 sprays into the nose daily. allergies      . nitroGLYCERIN (NITROSTAT) 0.4 MG SL tablet Place 1 tablet (0.4 mg total) under the tongue  every 5 (five) minutes as needed. For chest pain  25 tablet  3  . NOVOFINE 30G X 8 MM MISC USE 18 TIMES DAILY AS DIRECTED.  200 each  2  . omega-3 acid ethyl esters (LOVAZA) 1 G capsule Take 2 g by mouth 2 (two) times daily.       . pantoprazole (PROTONIX) 40 MG tablet Take 40 mg by mouth 2 (two) times daily.        . potassium chloride SA (K-DUR,KLOR-CON) 20 MEQ tablet Take 60 mEq by mouth 2 (two) times daily.       . ramipril (ALTACE) 10 MG tablet Take 1 tablet (10 mg total) by mouth at bedtime.  30 tablet  6  . rOPINIRole (REQUIP) 1 MG tablet Take 1 mg by mouth at bedtime.        Marland Kitchen spironolactone (ALDACTONE) 25 MG tablet Take 1 tablet (25 mg total) by mouth daily.  30 tablet  6  . SUMAtriptan (IMITREX) 25 MG tablet Take 25 mg by mouth every 2 (two) hours as needed. For migraine      . tiotropium (SPIRIVA) 18 MCG inhalation capsule Place 18 mcg into inhaler and inhale daily.        . traMADol (ULTRAM) 50 MG tablet Take 50 mg by mouth every 6 (six) hours as needed. pain       No current facility-administered medications for this visit.    Physical Exam: Filed Vitals:   02/10/13 0926  BP: 110/68  Pulse: 100  Height: 5\' 5"  (1.651 m)  Weight: 221 lb 12.8 oz (100.608 kg)    GEN- The patient is well appearing, alert and oriented x 3 today.   Head- normocephalic, atraumatic Eyes-  Sclera clear, conjunctiva pink Ears- hearing intact Oropharynx- clear Lungs- Clear to ausculation bilaterally, normal work of breathing Chest- ICD pocket is well healed Heart- Regular rate and rhythm, no murmurs, rubs or gallops, PMI not laterally displaced GI- soft, NT, ND, + BS Extremities- no clubbing, cyanosis, or edema  ICD interrogation- reviewed in detail today,  See PACEART report  Assessment and Plan:  1.  Chronic systolic dysfunction euvolemic today Stable on an appropriate medical regimen Normal ICD function See Pace Art report No changes today  Merlin Return in 9 months

## 2013-02-17 ENCOUNTER — Ambulatory Visit: Payer: Medicare Other | Admitting: Endocrinology

## 2013-02-23 ENCOUNTER — Ambulatory Visit (INDEPENDENT_AMBULATORY_CARE_PROVIDER_SITE_OTHER): Payer: Medicare Other | Admitting: Endocrinology

## 2013-02-23 ENCOUNTER — Encounter: Payer: Self-pay | Admitting: Endocrinology

## 2013-02-23 VITALS — BP 100/64 | HR 71 | Temp 97.9°F | Ht 66.0 in | Wt 220.8 lb

## 2013-02-23 DIAGNOSIS — E1049 Type 1 diabetes mellitus with other diabetic neurological complication: Secondary | ICD-10-CM

## 2013-02-23 MED ORDER — INSULIN LISPRO PROT & LISPRO (75-25 MIX) 100 UNIT/ML KWIKPEN: 150.0000 [IU] | PEN_INJECTOR | Freq: Two times a day (BID) | SUBCUTANEOUS | Status: DC

## 2013-02-23 NOTE — Patient Instructions (Addendum)
blood tests are being requested for you today.  We'll contact you with results. Please increase humalog 75/25 to 150 units 2x a day (with 1st and last meals of the day).  i have sent a prescription to your pharmacy.   Please make a follow-up appointment in 1 month.   check your blood sugar 2 times a day.  vary the time of day when you check, between before the 3 meals, and at bedtime.  also check if you have symptoms of your blood sugar being too high or too low.  please keep a record of the readings and bring it to your next appointment here.  please call us sooner if you are having low blood sugar episodes, or if it stays over 200.

## 2013-02-23 NOTE — Progress Notes (Signed)
Subjective:    Patient ID: [name], female    DOB: 02-15-1956, 57 y.o.   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  she brings a record of her cbg's which i have reviewed today.  It varies from 180-300's.  There is no trend throughout the day.  pt states she feels well in general.   Past Medical History  Diagnosis Date  . 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 DYSLIPIDEMIA 05/01/2009  . OBESITY 07/24/2007  . OBSTRUCTIVE SLEEP APNEA 07/24/2007    with CPAP compliance  . RESTLESS LEG SYNDROME 07/24/2007  . SYSTOLIC HEART FAILURE, CHRONIC 01/05/2009  . TACHYCARDIA 10/25/2008  . Palpitations 09/20/2010  . 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 Hypertension   . 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 Previous EF 25%. ECHO 6/08 EF 55% ECHO 5/10 EF 30-35% MRI 7/10 EF 45% Myoview 10/11 59% no ischemia  . History of breast cancer 2006    with return in 2008, s/p mastectomy, XRT and chemotherapy  . Osteoarthritis   . Anxiety   . Depression     followed by a psychiatrist  . Asthma   . GERD (gastroesophageal reflux disease)   . Gastroparesis   . IBS (irritable bowel syndrome)   . Myocardial infarction   . Dysrhythmia     tachycardia  . ICD (implantable cardiac defibrillator) in place 11/10/2012    Dr Johney Frame  . COPD (chronic obstructive pulmonary disease)   . Headache(784.0)   . Cancer     hx of in right breast    Past Surgical History  Procedure Laterality Date  . Mastectomy      right  . Abdominal hysterectomy  1991  . Cardiac defibrillator placement  11/10/2012    SJM Fortify Assura VR implanted by Dr Johney Frame for primary prevention  . Cholecystectomy    . Wrist surgery Bilateral   . Knee cartilage surgery      History   Social History  .  Marital Status: Divorced    Spouse Name: N/A    Number of Children: N/A  . Years of Education: N/A   Occupational History  . Disabled    Social History Main Topics  . Smoking status: Former Smoker    Quit date: 04/12/2001  . Smokeless tobacco: Never Used  . Alcohol Use: No  . Drug Use: No  . Sexually Active: Not on file   Other Topics Concern  . Not on file   Social History Narrative   Lives with mother and brother Lalla Brothers.   Regular exercise-no    Current Outpatient Prescriptions on File Prior to Visit  Medication Sig Dispense Refill  . ACCU-CHEK FASTCLIX LANCETS MISC       . ACCU-CHEK SMARTVIEW test strip       . acetaminophen (TYLENOL ARTHRITIS PAIN) 650 MG CR tablet Take 1,300 mg by mouth every 8 (eight) hours as needed. pain      . albuterol (PROVENTIL HFA) 108 (90 BASE) MCG/ACT inhaler Inhale 2 puffs into the lungs every 6 (six) hours as needed. For shortness of breath      . atorvastatin (LIPITOR) 40 MG tablet Take 40 mg by mouth daily.        . busPIRone (BUSPAR) 30 MG tablet Take 30 mg by mouth  2 (two) times daily.       . carvedilol (COREG) 12.5 MG tablet Take 2 tablets (25 mg total) by mouth 2 (two) times daily with a meal.  120 tablet  3  . citalopram (CELEXA) 40 MG tablet Take 40 mg by mouth daily.      . digoxin (LANOXIN) 0.125 MG tablet Take 1 tablet (125 mcg total) by mouth daily.  30 tablet  6  . diphenhydramine-acetaminophen (TYLENOL PM) 25-500 MG TABS Take 1 tablet by mouth at bedtime. For insomnia      . furosemide (LASIX) 20 MG tablet Take 3 tablets (60 mg total) by mouth 2 (two) times daily.  180 tablet  6  . Glucosamine 500 MG CAPS Take 1 capsule by mouth daily.        . Insulin Pen Needle (B-D ULTRAFINE III SHORT PEN) 31G X 8 MM MISC 18/day as directed dx 250.00  500 each  11  . levothyroxine (SYNTHROID, LEVOTHROID) 75 MCG tablet Take 75 mcg by mouth daily.        Marland Kitchen loperamide (IMODIUM A-D) 2 MG tablet Take 4 mg by mouth as needed for diarrhea or  loose stools. diarrhea      . metolazone (ZAROXOLYN) 2.5 MG tablet Take 2.5 mg as needed for weight gain.  10 tablet  1  . mometasone (NASONEX) 50 MCG/ACT nasal spray Place 2 sprays into the nose daily. allergies      . nitroGLYCERIN (NITROSTAT) 0.4 MG SL tablet Place 1 tablet (0.4 mg total) under the tongue every 5 (five) minutes as needed. For chest pain  25 tablet  3  . NOVOFINE 30G X 8 MM MISC USE 18 TIMES DAILY AS DIRECTED.  200 each  2  . omega-3 acid ethyl esters (LOVAZA) 1 G capsule Take 2 g by mouth 2 (two) times daily.       . pantoprazole (PROTONIX) 40 MG tablet Take 40 mg by mouth 2 (two) times daily.        . potassium chloride SA (K-DUR,KLOR-CON) 20 MEQ tablet Take 60 mEq by mouth 2 (two) times daily.       . ramipril (ALTACE) 10 MG tablet Take 1 tablet (10 mg total) by mouth at bedtime.  30 tablet  6  . rOPINIRole (REQUIP) 1 MG tablet Take 1 mg by mouth at bedtime.        Marland Kitchen spironolactone (ALDACTONE) 25 MG tablet Take 1 tablet (25 mg total) by mouth daily.  30 tablet  6  . SUMAtriptan (IMITREX) 25 MG tablet Take 25 mg by mouth every 2 (two) hours as needed. For migraine      . tiotropium (SPIRIVA) 18 MCG inhalation capsule Place 18 mcg into inhaler and inhale daily.        . traMADol (ULTRAM) 50 MG tablet Take 50 mg by mouth every 6 (six) hours as needed. pain      . cyclobenzaprine (FLEXERIL) 10 MG tablet Take 10 mg by mouth 2 (two) times daily. For pain       No current facility-administered medications on file prior to visit.    Allergies  Allergen Reactions  . Aspirin Hives  . Ciprofloxacin Hives  . Codeine Nausea Only    "can take ONLY if she eats with med"  . Naproxen Hives  . Sulfonamide Derivatives Hives  . Tape Rash    Also allergic to metal   Family History  Problem Relation Age of Onset  . Diabetes Mother   .  Hypertension Other   . Coronary artery disease Father 61   BP 100/64  Pulse 71  Temp(Src) 97.9 F (36.6 C) (Oral)  Ht 5\' 6"  (1.676 m)  Wt 220  lb 12.8 oz (100.154 kg)  BMI 35.65 kg/m2  SpO2 93%  Review of Systems denies hypoglycemia and weight change    Objective:   Physical Exam VITAL SIGNS:  See vs page GENERAL: no distress SKIN:  Insulin injection sites at the anterior abdomen are normal.       Assessment & Plan:  DM: she needs increased rx.  This insulin regimen was chosen from multiple options, for is relative simplicity.  The benefits of glycemic control must be weighed against the risks of hypoglycemia.

## 2013-02-26 ENCOUNTER — Ambulatory Visit (HOSPITAL_BASED_OUTPATIENT_CLINIC_OR_DEPARTMENT_OTHER): Payer: Medicare Other | Admitting: Oncology

## 2013-02-26 ENCOUNTER — Telehealth: Payer: Self-pay | Admitting: *Deleted

## 2013-02-26 ENCOUNTER — Encounter: Payer: Self-pay | Admitting: Oncology

## 2013-02-26 VITALS — BP 97/59 | HR 102 | Temp 97.9°F | Resp 20 | Ht 66.0 in | Wt 224.2 lb

## 2013-02-26 DIAGNOSIS — C50912 Malignant neoplasm of unspecified site of left female breast: Secondary | ICD-10-CM

## 2013-02-26 DIAGNOSIS — C50919 Malignant neoplasm of unspecified site of unspecified female breast: Secondary | ICD-10-CM

## 2013-02-26 NOTE — Telephone Encounter (Signed)
appts made and printed...td 

## 2013-03-01 ENCOUNTER — Other Ambulatory Visit: Payer: Self-pay

## 2013-03-01 MED ORDER — INSULIN LISPRO PROT & LISPRO (75-25 MIX) 100 UNIT/ML KWIKPEN: 150.0000 [IU] | PEN_INJECTOR | Freq: Two times a day (BID) | SUBCUTANEOUS | Status: DC

## 2013-03-12 ENCOUNTER — Other Ambulatory Visit (HOSPITAL_COMMUNITY): Payer: Self-pay | Admitting: *Deleted

## 2013-03-12 MED ORDER — METOLAZONE 2.5 MG PO TABS: ORAL_TABLET | ORAL | Status: DC

## 2013-03-19 ENCOUNTER — Other Ambulatory Visit: Payer: Self-pay

## 2013-03-19 DIAGNOSIS — I5022 Chronic systolic (congestive) heart failure: Secondary | ICD-10-CM

## 2013-03-19 MED ORDER — CARVEDILOL 12.5 MG PO TABS: 25.0000 mg | ORAL_TABLET | Freq: Two times a day (BID) | ORAL | Status: DC

## 2013-03-26 ENCOUNTER — Encounter: Payer: Self-pay | Admitting: Endocrinology

## 2013-03-26 ENCOUNTER — Ambulatory Visit (INDEPENDENT_AMBULATORY_CARE_PROVIDER_SITE_OTHER): Payer: Medicare Other | Admitting: Endocrinology

## 2013-03-26 VITALS — BP 134/78 | HR 76 | Ht 65.0 in | Wt 224.0 lb

## 2013-03-26 DIAGNOSIS — E119 Type 2 diabetes mellitus without complications: Secondary | ICD-10-CM

## 2013-03-26 MED ORDER — INSULIN LISPRO PROT & LISPRO (75-25 MIX) 100 UNIT/ML KWIKPEN: 160.0000 [IU] | PEN_INJECTOR | Freq: Two times a day (BID) | SUBCUTANEOUS | Status: DC

## 2013-03-26 NOTE — Patient Instructions (Addendum)
Please increase humalog 75/25 to 160 units 2x a day (with 1st and last meals of the day).  i have sent a prescription to your pharmacy.   Please make a follow-up appointment in 1 month.   check your blood sugar 2 times a day.  vary the time of day when you check, between before the 3 meals, and at bedtime.  also check if you have symptoms of your blood sugar being too high or too low.  please keep a record of the readings and bring it to your next appointment here.  please call us sooner if you are having low blood sugar episodes, or if it stays over 200.

## 2013-03-26 NOTE — Progress Notes (Signed)
Subjective:    Patient ID: [name], female    DOB: [birth year], 56 y.o.   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  she brings a record of her cbg's which i have reviewed today.  It varies from 111-300's, but most are in the high-100's.  There is no trend throughout the day.  pt states she feels well in general, except for fatigue. Past Medical History  Diagnosis Date  . 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 DYSLIPIDEMIA 05/01/2009  . OBESITY 07/24/2007  . OBSTRUCTIVE SLEEP APNEA 07/24/2007    with CPAP compliance  . RESTLESS LEG SYNDROME 07/24/2007  . SYSTOLIC HEART FAILURE, CHRONIC 01/05/2009  . TACHYCARDIA 10/25/2008  . Palpitations 09/20/2010  . 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 Hypertension   . 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 Previous EF 25%. ECHO 6/08 EF 55% ECHO 5/10 EF 30-35% MRI 7/10 EF 45% Myoview 10/11 59% no ischemia  . History of breast cancer 2006    with return in 2008, s/p mastectomy, XRT and chemotherapy  . Osteoarthritis   . Anxiety   . Depression     followed by a psychiatrist  . Asthma   . GERD (gastroesophageal reflux disease)   . Gastroparesis   . IBS (irritable bowel syndrome)   . Myocardial infarction   . Dysrhythmia     tachycardia  . ICD (implantable cardiac defibrillator) in place 11/10/2012    Dr Johney Frame  . COPD (chronic obstructive pulmonary disease)   . Headache(784.0)   . Cancer     hx of in right breast    Past Surgical History  Procedure Laterality Date  . Mastectomy      right  . Abdominal hysterectomy  1991  . Cardiac defibrillator placement  11/10/2012    SJM Fortify Assura VR implanted by Dr Johney Frame for primary prevention  . Cholecystectomy    . Wrist surgery Bilateral   . Knee cartilage  surgery      History   Social History  . Marital Status: Divorced    Spouse Name: N/A    Number of Children: N/A  . Years of Education: N/A   Occupational History  . Disabled    Social History Main Topics  . Smoking status: Former Smoker    Quit date: 04/12/2001  . Smokeless tobacco: Never Used  . Alcohol Use: No  . Drug Use: No  . Sexual Activity: Not Currently   Other Topics Concern  . Not on file   Social History Narrative   Lives with mother and brother Lalla Brothers.   Regular exercise-no    Current Outpatient Prescriptions on File Prior to Visit  Medication Sig Dispense Refill  . ACCU-CHEK FASTCLIX LANCETS MISC       . ACCU-CHEK SMARTVIEW test strip       . acetaminophen (TYLENOL ARTHRITIS PAIN) 650 MG CR tablet Take 1,300 mg by mouth every 8 (eight) hours as needed. pain      . albuterol (PROVENTIL HFA) 108 (90 BASE) MCG/ACT inhaler Inhale 2 puffs into the lungs every 6 (six) hours as needed. For shortness of breath      . atorvastatin (LIPITOR) 40 MG tablet Take 40 mg by mouth daily.        . baclofen (LIORESAL) 10 MG  tablet Take 10 mg by mouth 2 (two) times daily as needed.      . busPIRone (BUSPAR) 30 MG tablet Take 30 mg by mouth 2 (two) times daily.       . carvedilol (COREG) 12.5 MG tablet Take 2 tablets (25 mg total) by mouth 2 (two) times daily with a meal.  120 tablet  5  . citalopram (CELEXA) 40 MG tablet Take 40 mg by mouth daily.      . digoxin (LANOXIN) 0.125 MG tablet Take 1 tablet (125 mcg total) by mouth daily.  30 tablet  6  . diphenhydramine-acetaminophen (TYLENOL PM) 25-500 MG TABS Take 1 tablet by mouth at bedtime. For insomnia      . furosemide (LASIX) 20 MG tablet Take 3 tablets (60 mg total) by mouth 2 (two) times daily.  180 tablet  6  . Glucosamine 500 MG CAPS Take 1 capsule by mouth daily.        . Insulin Pen Needle (B-D ULTRAFINE III SHORT PEN) 31G X 8 MM MISC 18/day as directed dx 250.00  500 each  11  . levothyroxine (SYNTHROID,  LEVOTHROID) 75 MCG tablet Take 75 mcg by mouth daily.        Marland Kitchen loperamide (IMODIUM A-D) 2 MG tablet Take 4 mg by mouth as needed for diarrhea or loose stools. diarrhea      . metolazone (ZAROXOLYN) 2.5 MG tablet Take 2.5 mg as needed for weight gain.  10 tablet  1  . mometasone (NASONEX) 50 MCG/ACT nasal spray Place 2 sprays into the nose daily. allergies      . nitroGLYCERIN (NITROSTAT) 0.4 MG SL tablet Place 1 tablet (0.4 mg total) under the tongue every 5 (five) minutes as needed. For chest pain  25 tablet  3  . NOVOFINE 30G X 8 MM MISC USE 18 TIMES DAILY AS DIRECTED.  200 each  2  . omega-3 acid ethyl esters (LOVAZA) 1 G capsule Take 2 g by mouth 2 (two) times daily.       . pantoprazole (PROTONIX) 40 MG tablet Take 40 mg by mouth 2 (two) times daily.        . potassium chloride SA (K-DUR,KLOR-CON) 20 MEQ tablet Take 60 mEq by mouth 2 (two) times daily.       . ramipril (ALTACE) 10 MG tablet Take 1 tablet (10 mg total) by mouth at bedtime.  30 tablet  6  . rOPINIRole (REQUIP) 1 MG tablet Take 1 mg by mouth at bedtime.        Marland Kitchen spironolactone (ALDACTONE) 25 MG tablet Take 1 tablet (25 mg total) by mouth daily.  30 tablet  6  . SUMAtriptan (IMITREX) 25 MG tablet Take 25 mg by mouth every 2 (two) hours as needed. For migraine      . tiotropium (SPIRIVA) 18 MCG inhalation capsule Place 18 mcg into inhaler and inhale daily.        . traMADol (ULTRAM) 50 MG tablet Take 50 mg by mouth every 6 (six) hours as needed. pain       No current facility-administered medications on file prior to visit.    Allergies  Allergen Reactions  . Aspirin Hives  . Ciprofloxacin Hives  . Codeine Nausea Only    "can take ONLY if she eats with med"  . Naproxen Hives  . Sulfonamide Derivatives Hives  . Tape Rash    Also allergic to metal    Family History  Problem Relation Age of Onset  .  Diabetes Mother   . Hypertension Other   . Coronary artery disease Father 61    BP 134/78  Pulse 76  Ht 5\' 5"   (1.651 m)  Wt 224 lb (101.606 kg)  BMI 37.28 kg/m2  SpO2 98%  Review of Systems denies hypoglycemia and weight change    Objective:   Physical Exam VITAL SIGNS:  See vs page GENERAL: no distress.      Assessment & Plan:  DM: she needs increased rx.  This insulin regimen was chosen from multiple options, for its simplicity.  The benefits of glycemic control must be weighed against the risks of hypoglycemia.   Neuropathy: this limits exercise rx of DM.

## 2013-03-28 NOTE — Progress Notes (Signed)
OFFICE PROGRESS NOTE  CC**  Joanne Fusi, MD [address] [place] [address]  DIAGNOSIS: 57 year old female with  History of breast cancer originally being seen by Dr. Pierce [name]  PRIOR THERAPY:  #1 T1 C. Breast cancer with treated with FEC radiation completed July 2006. 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  CURRENT THERAPY:observation  INTERVAL HISTORY: [name] 57 y.o. female returns for followup visit at one year. Her last visit was with Dr. Pierce [name]. Clinically she seems to be doing well without any significant problems. She denies any fevers chills night sweats headaches shortness of breath chest pains palpitations no myalgias and arthralgias. Remainder of the 10 point review of systems is negative  MEDICAL HISTORY: Past Medical History  Diagnosis Date  . 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 DYSLIPIDEMIA 05/01/2009  . OBESITY 07/24/2007  . OBSTRUCTIVE SLEEP APNEA 07/24/2007    with CPAP compliance  . RESTLESS LEG SYNDROME 07/24/2007  . SYSTOLIC HEART FAILURE, CHRONIC 01/05/2009  . TACHYCARDIA 10/25/2008  . Palpitations 09/20/2010  . 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 Hypertension   . 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 Previous EF 25%. ECHO 6/08 EF 55% ECHO 5/10 EF 30-35% MRI 7/10 EF 45% Myoview 10/11 59% no ischemia  . History of breast cancer 2006    with return in 2008, s/p mastectomy, XRT and chemotherapy  . Osteoarthritis   . Anxiety   . Depression     followed by a psychiatrist  . Asthma   . GERD (gastroesophageal reflux disease)   . Gastroparesis   . IBS (irritable bowel syndrome)   . Myocardial infarction   . Dysrhythmia     tachycardia  . ICD (implantable cardiac defibrillator) in place 11/10/2012    Dr [name]  . COPD  (chronic obstructive pulmonary disease)   . Headache(784.0)   . Cancer     hx of in right breast    ALLERGIES:  is allergic to aspirin; ciprofloxacin; codeine; naproxen; sulfonamide derivatives; and tape.  MEDICATIONS:  Current Outpatient Prescriptions  Medication Sig Dispense Refill  . ACCU-CHEK FASTCLIX LANCETS MISC       . ACCU-CHEK SMARTVIEW test strip       . acetaminophen (TYLENOL ARTHRITIS PAIN) 650 MG CR tablet Take 1,300 mg by mouth every 8 (eight) hours as needed. pain      . albuterol (PROVENTIL HFA) 108 (90 BASE) MCG/ACT inhaler Inhale 2 puffs into the lungs every 6 (six) hours as needed. For shortness of breath      . atorvastatin (LIPITOR) 40 MG tablet Take 40 mg by mouth daily.        . baclofen (LIORESAL) 10 MG tablet Take 10 mg by mouth 2 (two) times daily as needed.      . busPIRone (BUSPAR) 30 MG tablet Take 30 mg by mouth 2 (two) times daily.       . citalopram (CELEXA) 40 MG tablet Take 40 mg by mouth daily.      . digoxin (LANOXIN) 0.125 MG tablet Take 1 tablet (125 mcg total) by mouth daily.  30 tablet  6  . diphenhydramine-acetaminophen (TYLENOL PM) 25-500 MG TABS Take 1 tablet by mouth at bedtime. For insomnia      . furosemide (LASIX) 20 MG tablet Take 3 tablets (60 mg  total) by mouth 2 (two) times daily.  180 tablet  6  . Glucosamine 500 MG CAPS Take 1 capsule by mouth daily.        . Insulin Pen Needle (B-D ULTRAFINE III SHORT PEN) 31G X 8 MM MISC 18/day as directed dx 250.00  500 each  11  . levothyroxine (SYNTHROID, LEVOTHROID) 75 MCG tablet Take 75 mcg by mouth daily.        Marland Kitchen loperamide (IMODIUM A-D) 2 MG tablet Take 4 mg by mouth as needed for diarrhea or loose stools. diarrhea      . mometasone (NASONEX) 50 MCG/ACT nasal spray Place 2 sprays into the nose daily. allergies      . NOVOFINE 30G X 8 MM MISC USE 18 TIMES DAILY AS DIRECTED.  200 each  2  . omega-3 acid ethyl esters (LOVAZA) 1 G capsule Take 2 g by mouth 2 (two) times daily.       .  pantoprazole (PROTONIX) 40 MG tablet Take 40 mg by mouth 2 (two) times daily.        . potassium chloride SA (K-DUR,KLOR-CON) 20 MEQ tablet Take 60 mEq by mouth 2 (two) times daily.       . ramipril (ALTACE) 10 MG tablet Take 1 tablet (10 mg total) by mouth at bedtime.  30 tablet  6  . rOPINIRole (REQUIP) 1 MG tablet Take 1 mg by mouth at bedtime.        Marland Kitchen spironolactone (ALDACTONE) 25 MG tablet Take 1 tablet (25 mg total) by mouth daily.  30 tablet  6  . SUMAtriptan (IMITREX) 25 MG tablet Take 25 mg by mouth every 2 (two) hours as needed. For migraine      . tiotropium (SPIRIVA) 18 MCG inhalation capsule Place 18 mcg into inhaler and inhale daily.        . traMADol (ULTRAM) 50 MG tablet Take 50 mg by mouth every 6 (six) hours as needed. pain      . carvedilol (COREG) 12.5 MG tablet Take 2 tablets (25 mg total) by mouth 2 (two) times daily with a meal.  120 tablet  5  . Insulin Lispro Prot & Lispro (HUMALOG MIX 75/25 KWIKPEN) (75-25) 100 UNIT/ML SUPN Inject 160 Units into the skin 2 (two) times daily before a meal.  35 pen  11  . metolazone (ZAROXOLYN) 2.5 MG tablet Take 2.5 mg as needed for weight gain.  10 tablet  1  . nitroGLYCERIN (NITROSTAT) 0.4 MG SL tablet Place 1 tablet (0.4 mg total) under the tongue every 5 (five) minutes as needed. For chest pain  25 tablet  3   No current facility-administered medications for this visit.    SURGICAL HISTORY:  Past Surgical History  Procedure Laterality Date  . Mastectomy      right  . Abdominal hysterectomy  1991  . Cardiac defibrillator placement  11/10/2012    SJM Fortify Assura VR implanted by Dr [name] for primary prevention  . Cholecystectomy    . Wrist surgery Bilateral   . Knee cartilage surgery      REVIEW OF SYSTEMS:  Pertinent items are noted in HPI.   HEALTH MAINTENANCE:  PHYSICAL EXAMINATION: Blood pressure 97/59, pulse 102, temperature 97.9 F (36.6 C), temperature source Oral, resp. rate 20, height 5\' 6"  (1.676 m), weight  224 lb 3.2 oz (101.696 kg). Body mass index is 36.2 kg/(m^2). ECOG PERFORMANCE STATUS: 0 - Asymptomatic   General appearance: alert and cooperative Resp: clear to auscultation  bilaterally Cardio: regular rate and rhythm GI: soft, non-tender; bowel sounds normal; no masses,  no organomegaly Extremities: extremities normal, atraumatic, no cyanosis or edema Neurologic: Grossly normal   LABORATORY DATA: Lab Results  Component Value Date   WBC 9.8 11/10/2012   HGB 12.5 11/10/2012   HCT 36.3 11/10/2012   MCV 84.0 11/10/2012   PLT 298 11/10/2012      Chemistry      Component Value Date/Time   NA 133* 11/11/2012 0830   K 4.8 11/11/2012 0830   CL 93* 11/11/2012 0830   CO2 29 11/11/2012 0830   BUN 18 11/11/2012 0830   CREATININE 0.69 11/11/2012 0830      Component Value Date/Time   CALCIUM 9.9 11/11/2012 0830   ALKPHOS 83 02/18/2012 1558   AST 26 02/18/2012 1558   ALT 29 02/18/2012 1558   BILITOT 0.9 02/18/2012 1558       RADIOGRAPHIC STUDIES:  No results found.  ASSESSMENT: 57 year old female with  #1 history of breast cancer that was triple negative. Original breast cancer was in July 2006. Recurrence in 2008 status post treatment. She has no evidence of recurrent disease.   PLAN:   #1 we will continue to observe the patient every year.   All questions were answered. The patient knows to call the clinic with any problems, questions or concerns. We can certainly see the patient much sooner if necessary.  I spent 15 minutes counseling the patient face to face. The total time spent in the appointment was 30 minutes.    Drue Second, MD Medical/Oncology [facility] [phone] (beeper) [phone] (Office)

## 2013-04-21 ENCOUNTER — Encounter (HOSPITAL_COMMUNITY): Payer: Medicare Other

## 2013-04-22 ENCOUNTER — Other Ambulatory Visit: Payer: Self-pay

## 2013-04-22 MED ORDER — SPIRONOLACTONE 25 MG PO TABS: 25.0000 mg | ORAL_TABLET | Freq: Every day | ORAL | Status: DC

## 2013-04-30 ENCOUNTER — Ambulatory Visit: Payer: Medicare Other | Admitting: Endocrinology

## 2013-05-04 ENCOUNTER — Encounter (HOSPITAL_COMMUNITY): Payer: Medicare Other

## 2013-05-07 ENCOUNTER — Encounter: Payer: Self-pay | Admitting: Endocrinology

## 2013-05-07 ENCOUNTER — Ambulatory Visit (INDEPENDENT_AMBULATORY_CARE_PROVIDER_SITE_OTHER): Payer: Medicare Other | Admitting: Endocrinology

## 2013-05-07 VITALS — BP 126/74 | HR 90 | Wt 230.0 lb

## 2013-05-07 DIAGNOSIS — E1049 Type 1 diabetes mellitus with other diabetic neurological complication: Secondary | ICD-10-CM

## 2013-05-07 DIAGNOSIS — E119 Type 2 diabetes mellitus without complications: Secondary | ICD-10-CM

## 2013-05-07 LAB — HEMOGLOBIN A1C
Hgb A1c MFr Bld: 8.6 % — ABNORMAL HIGH
Mean Plasma Glucose: 200 mg/dL — ABNORMAL HIGH

## 2013-05-07 NOTE — Patient Instructions (Addendum)
Please increase humalog 75/25 to 170 units 2x a day (with 1st and last meals of the day).   blood tests are being requested for you today.  We'll contact you with results. Please make a follow-up appointment in in January. check your blood sugar 2 times a day.  vary the time of day when you check, between before the 3 meals, and at bedtime.  also check if you have symptoms of your blood sugar being too high or too low.  please keep a record of the readings and bring it to your next appointment here.  please call us sooner if you are having low blood sugar episodes, or if it stays over 200.

## 2013-05-07 NOTE — Progress Notes (Signed)
Subjective:    Patient ID: [name], female    DOB: 02-01-1956, 57 y.o.   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  she brings a record of her cbg's which i have reviewed today.  It varies from 109-227.  There is no trend throughout the day, but it is often over 200.  Past Medical History  Diagnosis Date  . 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 DYSLIPIDEMIA 05/01/2009  . OBESITY 07/24/2007  . OBSTRUCTIVE SLEEP APNEA 07/24/2007    with CPAP compliance  . RESTLESS LEG SYNDROME 07/24/2007  . SYSTOLIC HEART FAILURE, CHRONIC 01/05/2009  . TACHYCARDIA 10/25/2008  . Palpitations 09/20/2010  . 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 Hypertension   . 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 Previous EF 25%. ECHO 6/08 EF 55% ECHO 5/10 EF 30-35% MRI 7/10 EF 45% Myoview 10/11 59% no ischemia  . History of breast cancer 2006    with return in 2008, s/p mastectomy, XRT and chemotherapy  . Osteoarthritis   . Anxiety   . Depression     followed by a psychiatrist  . Asthma   . GERD (gastroesophageal reflux disease)   . Gastroparesis   . IBS (irritable bowel syndrome)   . Myocardial infarction   . Dysrhythmia     tachycardia  . ICD (implantable cardiac defibrillator) in place 11/10/2012    Dr Johney Frame  . COPD (chronic obstructive pulmonary disease)   . Headache(784.0)   . Cancer     hx of in right breast    Past Surgical History  Procedure Laterality Date  . Mastectomy      right  . Abdominal hysterectomy  1991  . Cardiac defibrillator placement  11/10/2012    SJM Fortify Assura VR implanted by Dr Johney Frame for primary prevention  . Cholecystectomy    . Wrist surgery Bilateral   . Knee cartilage surgery      History   Social History  . Marital Status:  Divorced    Spouse Name: N/A    Number of Children: N/A  . Years of Education: N/A   Occupational History  . Disabled    Social History Main Topics  . Smoking status: Former Smoker    Quit date: 04/12/2001  . Smokeless tobacco: Never Used  . Alcohol Use: No  . Drug Use: No  . Sexual Activity: Not Currently   Other Topics Concern  . Not on file   Social History Narrative   Lives with mother and brother Lalla Brothers.   Regular exercise-no    Current Outpatient Prescriptions on File Prior to Visit  Medication Sig Dispense Refill  . ACCU-CHEK FASTCLIX LANCETS MISC       . ACCU-CHEK SMARTVIEW test strip       . acetaminophen (TYLENOL ARTHRITIS PAIN) 650 MG CR tablet Take 1,300 mg by mouth every 8 (eight) hours as needed. pain      . albuterol (PROVENTIL HFA) 108 (90 BASE) MCG/ACT inhaler Inhale 2 puffs into the lungs every 6 (six) hours as needed. For shortness of breath      . atorvastatin (LIPITOR) 40 MG tablet Take 40 mg by mouth daily.        . baclofen (LIORESAL) 10 MG tablet Take 10 mg by mouth 2 (two) times daily  as needed.      . busPIRone (BUSPAR) 30 MG tablet Take 30 mg by mouth 2 (two) times daily.       . carvedilol (COREG) 12.5 MG tablet Take 2 tablets (25 mg total) by mouth 2 (two) times daily with a meal.  120 tablet  5  . citalopram (CELEXA) 40 MG tablet Take 40 mg by mouth daily.      . digoxin (LANOXIN) 0.125 MG tablet Take 1 tablet (125 mcg total) by mouth daily.  30 tablet  6  . diphenhydramine-acetaminophen (TYLENOL PM) 25-500 MG TABS Take 1 tablet by mouth at bedtime. For insomnia      . furosemide (LASIX) 20 MG tablet Take 3 tablets (60 mg total) by mouth 2 (two) times daily.  180 tablet  6  . Glucosamine 500 MG CAPS Take 1 capsule by mouth daily.        . Insulin Pen Needle (B-D ULTRAFINE III SHORT PEN) 31G X 8 MM MISC 18/day as directed dx 250.00  500 each  11  . levothyroxine (SYNTHROID, LEVOTHROID) 75 MCG tablet Take 75 mcg by mouth daily.        Marland Kitchen  loperamide (IMODIUM A-D) 2 MG tablet Take 4 mg by mouth as needed for diarrhea or loose stools. diarrhea      . metolazone (ZAROXOLYN) 2.5 MG tablet Take 2.5 mg as needed for weight gain.  10 tablet  1  . mometasone (NASONEX) 50 MCG/ACT nasal spray Place 2 sprays into the nose daily. allergies      . nitroGLYCERIN (NITROSTAT) 0.4 MG SL tablet Place 1 tablet (0.4 mg total) under the tongue every 5 (five) minutes as needed. For chest pain  25 tablet  3  . NOVOFINE 30G X 8 MM MISC USE 18 TIMES DAILY AS DIRECTED.  200 each  2  . omega-3 acid ethyl esters (LOVAZA) 1 G capsule Take 2 g by mouth 2 (two) times daily.       . pantoprazole (PROTONIX) 40 MG tablet Take 40 mg by mouth 2 (two) times daily.        . potassium chloride SA (K-DUR,KLOR-CON) 20 MEQ tablet Take 60 mEq by mouth 2 (two) times daily.       . ramipril (ALTACE) 10 MG tablet Take 1 tablet (10 mg total) by mouth at bedtime.  30 tablet  6  . rOPINIRole (REQUIP) 1 MG tablet Take 1 mg by mouth at bedtime.        Marland Kitchen spironolactone (ALDACTONE) 25 MG tablet Take 1 tablet (25 mg total) by mouth daily.  30 tablet  10  . SUMAtriptan (IMITREX) 25 MG tablet Take 25 mg by mouth every 2 (two) hours as needed. For migraine      . tiotropium (SPIRIVA) 18 MCG inhalation capsule Place 18 mcg into inhaler and inhale daily.        . traMADol (ULTRAM) 50 MG tablet Take 50 mg by mouth every 6 (six) hours as needed. pain       No current facility-administered medications on file prior to visit.   Allergies  Allergen Reactions  . Aspirin Hives  . Ciprofloxacin Hives  . Codeine Nausea Only    "can take ONLY if she eats with med"  . Naproxen Hives  . Sulfonamide Derivatives Hives  . Tape Rash    Also allergic to metal   Family History  Problem Relation Age of Onset  . Diabetes Mother   . Hypertension Other   .  Coronary artery disease Father 61   BP 126/74  Pulse 90  Wt 230 lb (104.327 kg)  BMI 38.27 kg/m2  SpO2 94%  Review of Systems denies  hypoglycemia.  She has gained weight.       Objective:   Physical Exam VITAL SIGNS:  See vs page GENERAL: no distress SKIN:  Insulin injection sites at the anterior abdomen are normal.    Lab Results  Component Value Date   HGBA1C 8.6* 05/07/2013      Assessment & Plan:  DM: she needs increased rx.  This insulin regimen was chosen from multiple options, for its simplicity. The benefits of glycemic control must be weighed against the risks of hypoglycemia.   Neuropathy and CHF: these limit exercise rx of DM.

## 2013-05-11 ENCOUNTER — Ambulatory Visit (HOSPITAL_COMMUNITY)
Admission: RE | Admit: 2013-05-11 | Discharge: 2013-05-11 | Disposition: A | Discharge status: Home / Self Care | Payer: Medicare Other | Source: Ambulatory Visit | Attending: Cardiology | Admitting: Cardiology

## 2013-05-11 VITALS — BP 84/42 | HR 95 | Wt 227.0 lb

## 2013-05-11 DIAGNOSIS — G4733 Obstructive sleep apnea (adult) (pediatric): Secondary | ICD-10-CM | POA: Insufficient documentation

## 2013-05-11 DIAGNOSIS — I10 Essential (primary) hypertension: Secondary | ICD-10-CM | POA: Insufficient documentation

## 2013-05-11 DIAGNOSIS — E785 Hyperlipidemia, unspecified: Secondary | ICD-10-CM | POA: Insufficient documentation

## 2013-05-11 DIAGNOSIS — I5022 Chronic systolic (congestive) heart failure: Secondary | ICD-10-CM | POA: Insufficient documentation

## 2013-05-11 LAB — BASIC METABOLIC PANEL
BUN: 21 mg/dL (ref 6–23)
CO2: 28 mEq/L (ref 19–32)
Calcium: 10.2 mg/dL (ref 8.4–10.5)
Chloride: 101 mEq/L (ref 96–112)
Creatinine, Ser: 0.73 mg/dL (ref 0.50–1.10)
GFR calc Af Amer: >90 mL/min (ref >90)

## 2013-05-11 NOTE — Patient Instructions (Addendum)
Doing great.  Try to increase exercise.  Continue all medications as prescribed.   Will call with lab results.  F/U 3 months  Do the following things EVERYDAY: 1) Weigh yourself in the morning before breakfast. Write it down and keep it in a log. 2) Take your medicines as prescribed 3) Eat low salt foods-Limit salt (sodium) to 2000 mg per day.  4) Stay as active as you can everyday 5) Limit all fluids for the day to less than 2 liters 6)

## 2013-05-11 NOTE — Progress Notes (Addendum)
Patient ID: [name], female   DOB: November 05, 1955, 57 y.o.   MRN: [id number] Endocrinologist: Dr Everardo All  Oncologist: Dr. Welton Flakes PCP: Gaye Alken, NP  HPI [name] is a 57 year old female with history of breast cancer, obesity, HTN, diabetes, IBS, HLD and OSA. She had chemo induced cardiomyopathy.  Her EF has fallen from 45% per Cardiac MRI in July 2010 to 20-25% in 10/2012. S/P single chamber ICD (St Jude) implantation on 11/10/12 by Dr. Johney Frame.     Myoview in 05/2010 EF 59%. No ischemia.   Echo 08/12/11: Left ventricle: mildly dilated, EF 20% to 25%. Diffuse hypokinesis. Doppler parameters are consistent with restrictive physiology, indicative of decreased left ventricular diastolic compliance and/or increased left atrial pressure. Left atrium was moderately dilated. Right atrium mildly dilated.  Admitted in June 2013 with cardiogenic shock. Required inotropes. Discharge weight 213 pounds  RHC on 01/30/12 showed decompensated HF with low cardiac output consistent with cardiogenic shock.  RHC 6/27  RA = 20  RV = 55/13/22  PA =56/34 (44)  PCW = 28  Fick cardiac output/index = 3.1/1.5  PVR = 5.3 [place]  FA sat = 91%  PA sat = 42%, 48%  SVC sat = 46%  04/09/12 ECHO EF 30-35% LV mildly dilated  Hyopkinesis 10/07/12 ECHO EF 20-25% RV ok.  Follow up: Doing pretty good other than stressful past week because her mom had a stroke. Needs 4 teeth pulled (2 in sinus cavity) and she needs clearance. She is going to have teeth pulled in [place], [place] d/t money issues. Denies SOB, CP or orthopnea. DOE with moderate exertion and going up stairs.  Weight at home 224-227 lbs. She has not needed any metolazone in 3 weeks. Restricting fluids to less than 2L a day and trying to follow low salt diet. Taking medications as prescribed and wears CPAP nightly. SBP at home 120-140/60s.  ROS: All systems negative except as listed in HPI, PMH and Problem List.  Past Medical History  Diagnosis Date  . 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  DYSLIPIDEMIA 05/01/2009  . OBESITY 07/24/2007  . OBSTRUCTIVE SLEEP APNEA 07/24/2007    with CPAP compliance  . RESTLESS LEG SYNDROME 07/24/2007  . SYSTOLIC HEART FAILURE, CHRONIC 01/05/2009  . TACHYCARDIA 10/25/2008  . Palpitations 09/20/2010  . 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 Hypertension   . 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 Previous EF 25%. ECHO 6/08 EF 55% ECHO 5/10 EF 30-35% MRI 7/10 EF 45% Myoview 10/11 59% no ischemia  . History of breast cancer 2006    with return in 2008, s/p mastectomy, XRT and chemotherapy  . Osteoarthritis   . Anxiety   . Depression     followed by a psychiatrist  . Asthma   . GERD (gastroesophageal reflux disease)   . Gastroparesis   . IBS (irritable bowel syndrome)   . Myocardial infarction   . Dysrhythmia     tachycardia  . ICD (implantable cardiac defibrillator) in place 11/10/2012    Dr Johney Frame  . COPD (chronic obstructive pulmonary disease)   . Headache(784.0)   . Cancer     hx of in right breast    Current Outpatient Prescriptions  Medication Sig Dispense Refill  . ACCU-CHEK FASTCLIX LANCETS MISC       . ACCU-CHEK SMARTVIEW test strip       . acetaminophen (TYLENOL ARTHRITIS PAIN) 650 MG CR tablet Take 1,300 mg  by mouth every 8 (eight) hours as needed. pain      . albuterol (PROVENTIL HFA) 108 (90 BASE) MCG/ACT inhaler Inhale 2 puffs into the lungs every 6 (six) hours as needed. For shortness of breath      . atorvastatin (LIPITOR) 40 MG tablet Take 40 mg by mouth daily.        . baclofen (LIORESAL) 10 MG tablet Take 10 mg by mouth 2 (two) times daily as needed.      . busPIRone (BUSPAR) 30 MG tablet Take 30 mg by mouth 2 (two) times daily.       . carvedilol (COREG) 12.5 MG tablet Take 2 tablets (25 mg total) by mouth 2 (two) times daily with a meal.  120 tablet  5  . citalopram (CELEXA) 40 MG tablet Take 40 mg by mouth daily.      . digoxin (LANOXIN)  0.125 MG tablet Take 1 tablet (125 mcg total) by mouth daily.  30 tablet  6  . diphenhydramine-acetaminophen (TYLENOL PM) 25-500 MG TABS Take 1 tablet by mouth at bedtime. For insomnia      . furosemide (LASIX) 20 MG tablet Take 3 tablets (60 mg total) by mouth 2 (two) times daily.  180 tablet  6  . Glucosamine 500 MG CAPS Take 1 capsule by mouth daily.        . Insulin Lispro Prot & Lispro (75-25) 100 UNIT/ML SUPN Inject 170 Units into the skin 2 (two) times daily before a meal.      . Insulin Pen Needle (B-D ULTRAFINE III SHORT PEN) 31G X 8 MM MISC 18/day as directed dx 250.00  500 each  11  . levothyroxine (SYNTHROID, LEVOTHROID) 75 MCG tablet Take 75 mcg by mouth daily.        Marland Kitchen loperamide (IMODIUM A-D) 2 MG tablet Take 4 mg by mouth as needed for diarrhea or loose stools. diarrhea      . metolazone (ZAROXOLYN) 2.5 MG tablet Take 2.5 mg as needed for weight gain.  10 tablet  1  . mometasone (NASONEX) 50 MCG/ACT nasal spray Place 2 sprays into the nose daily. allergies      . nitroGLYCERIN (NITROSTAT) 0.4 MG SL tablet Place 1 tablet (0.4 mg total) under the tongue every 5 (five) minutes as needed. For chest pain  25 tablet  3  . NOVOFINE 30G X 8 MM MISC USE 18 TIMES DAILY AS DIRECTED.  200 each  2  . omega-3 acid ethyl esters (LOVAZA) 1 G capsule Take 2 g by mouth 2 (two) times daily.       . pantoprazole (PROTONIX) 40 MG tablet Take 40 mg by mouth 2 (two) times daily.        . potassium chloride SA (K-DUR,KLOR-CON) 20 MEQ tablet Take 60 mEq by mouth 2 (two) times daily.       . ramipril (ALTACE) 10 MG tablet Take 1 tablet (10 mg total) by mouth at bedtime.  30 tablet  6  . rOPINIRole (REQUIP) 1 MG tablet Take 1 mg by mouth at bedtime.        Marland Kitchen spironolactone (ALDACTONE) 25 MG tablet Take 1 tablet (25 mg total) by mouth daily.  30 tablet  10  . SUMAtriptan (IMITREX) 25 MG tablet Take 25 mg by mouth every 2 (two) hours as needed. For migraine      . tiotropium (SPIRIVA) 18 MCG inhalation capsule  Place 18 mcg into inhaler and inhale daily.        Marland Kitchen  traMADol (ULTRAM) 50 MG tablet Take 50 mg by mouth every 6 (six) hours as needed. pain       No current facility-administered medications for this encounter.   Filed Vitals:   05/11/13 1332  BP: 84/42  Pulse: 95  Weight: 227 lb (102.967 kg)  SpO2: 92%    PHYSICAL EXAM: General:  Obese, Well appearing. No resp difficulty HEENT: normal except for poor dentition Neck: supple. JVP hard to see but does not appear elevated. Carotids 2+ bilaterally; no bruits. No lymphadenopathy or thryomegaly appreciated. Cor: PMI normal. Regular rate & rhythm. no S3. Lungs: clear Abdomen: soft, obese, nontender, mild distention. No hepatosplenomegaly. No bruits or masses. Good bowel sounds. Extremities: no cyanosis, clubbing, rash, tr edema.  Neuro: alert & orientedx3, cranial nerves grossly intact. Moves all 4 extremities w/o difficulty. Affect pleasant.  ASSESSMENT & PLAN:  1) Chronic systolic HF, NICM thought to be chemo induced, EF 20-25% RV ok (10/2012) s/p ICD (2014) - NYHA II-III symptoms. Volume status stable and she does use sliding scale diuretics. Weight is up slightly since last visit, but do not feel as if fluid. - On goal dose coreg 25 mg BID and ramipril 10 mg daily. Continue digoxin 0.125 mcg daily and spiro 25 mg daily. - Will check BMET today. - Reinforced the need and importance of daily weights, a low sodium diet, and fluid restriction (less than 2 L a day). Instructed to call the HF clinic if weight increases more than 3 lbs overnight or 5 lbs in a week.   2) HTN - SBP 84 today. No complaints of dizziness and reports SBP at home 120-140s/60s - Will continue spiro, ramipril, and coreg.  3) OSA - Wears CPAP nightly, continue to wear  4) HLD - Followed by Gaye Alken, NP. Have asked to have lab results faxed to [place]  She needs a medical letter for clearance for dental surgery. She needs 4 teeth removed. Will discuss with Dr. Shirlee Latch.    F/U 3 months Ulla Potash B NP-C 3:47 PM

## 2013-05-12 ENCOUNTER — Other Ambulatory Visit: Payer: Self-pay | Admitting: Endocrinology

## 2013-05-13 ENCOUNTER — Encounter (HOSPITAL_COMMUNITY): Payer: Self-pay | Admitting: Anesthesiology

## 2013-05-17 ENCOUNTER — Ambulatory Visit (INDEPENDENT_AMBULATORY_CARE_PROVIDER_SITE_OTHER): Payer: Medicare Other | Admitting: *Deleted

## 2013-05-17 DIAGNOSIS — R000 Tachycardia, unspecified: Secondary | ICD-10-CM

## 2013-05-17 DIAGNOSIS — I5022 Chronic systolic (congestive) heart failure: Secondary | ICD-10-CM

## 2013-05-17 DIAGNOSIS — I498 Other specified cardiac arrhythmias: Secondary | ICD-10-CM

## 2013-05-19 ENCOUNTER — Other Ambulatory Visit: Payer: Self-pay

## 2013-05-19 DIAGNOSIS — I5022 Chronic systolic (congestive) heart failure: Secondary | ICD-10-CM

## 2013-05-19 LAB — REMOTE ICD DEVICE
BRDY-0002RV: 40 {beats}/min
DEV-0020ICD: NEGATIVE
DEVICE MODEL ICD: 7066421
HV IMPEDENCE: 81 Ohm
RV LEAD IMPEDENCE ICD: 490 Ohm
TZON-0005SLOWVT: 6
TZON-0010SLOWVT: 40 ms
VENTRICULAR PACING ICD: 1 pct

## 2013-05-19 MED ORDER — RAMIPRIL 10 MG PO CAPS: 10.0000 mg | ORAL_CAPSULE | Freq: Every day | ORAL | Status: DC

## 2013-05-26 ENCOUNTER — Encounter: Payer: Self-pay | Admitting: Internal Medicine

## 2013-05-27 ENCOUNTER — Other Ambulatory Visit: Payer: Self-pay

## 2013-05-27 DIAGNOSIS — I5022 Chronic systolic (congestive) heart failure: Secondary | ICD-10-CM

## 2013-05-27 MED ORDER — FUROSEMIDE 20 MG PO TABS: 60.0000 mg | ORAL_TABLET | Freq: Two times a day (BID) | ORAL | Status: DC

## 2013-06-14 ENCOUNTER — Telehealth: Payer: Self-pay | Admitting: Endocrinology

## 2013-06-14 ENCOUNTER — Encounter: Payer: Self-pay | Admitting: Internal Medicine

## 2013-06-14 NOTE — Telephone Encounter (Signed)
Please increase humalog 75/25 to 180 units 2x a day (with 1st and last meals of the day).  Please call if this does not help

## 2013-06-14 NOTE — Telephone Encounter (Signed)
Patient stated that ever since 05/25/13 her nerves have been bothering her and her BS has been spiking. Patient stated that BS was 255 this am, and yesterday it was 235 am & 240 pm. Patient also stated that she is not eating carbs and that she has lost 6 pounds in 1 week due to no appetite. Patient feels that all these symptoms are caused by stress and anxiety. Please advise.

## 2013-06-14 NOTE — Telephone Encounter (Signed)
Left detailed message on patient's vm. Per patient's request.

## 2013-06-17 ENCOUNTER — Other Ambulatory Visit: Payer: Self-pay

## 2013-06-17 MED ORDER — METOLAZONE 2.5 MG PO TABS: ORAL_TABLET | ORAL | Status: DC

## 2013-07-19 ENCOUNTER — Other Ambulatory Visit: Payer: Self-pay

## 2013-08-02 ENCOUNTER — Emergency Department (HOSPITAL_COMMUNITY)
Admission: EM | Admit: 2013-08-02 | Discharge: 2013-08-03 | Disposition: A | Discharge status: Home / Self Care | Payer: Medicare Other | Attending: Emergency Medicine | Admitting: Emergency Medicine

## 2013-08-02 ENCOUNTER — Encounter (HOSPITAL_COMMUNITY): Payer: Self-pay | Admitting: Emergency Medicine

## 2013-08-02 DIAGNOSIS — R51 Headache: Secondary | ICD-10-CM | POA: Insufficient documentation

## 2013-08-02 DIAGNOSIS — F329 Major depressive disorder, single episode, unspecified: Secondary | ICD-10-CM | POA: Insufficient documentation

## 2013-08-02 DIAGNOSIS — I5022 Chronic systolic (congestive) heart failure: Secondary | ICD-10-CM | POA: Insufficient documentation

## 2013-08-02 DIAGNOSIS — Y939 Activity, unspecified: Secondary | ICD-10-CM | POA: Insufficient documentation

## 2013-08-02 DIAGNOSIS — F3289 Other specified depressive episodes: Secondary | ICD-10-CM | POA: Insufficient documentation

## 2013-08-02 DIAGNOSIS — I252 Old myocardial infarction: Secondary | ICD-10-CM | POA: Insufficient documentation

## 2013-08-02 DIAGNOSIS — T171XXA Foreign body in nostril, initial encounter: Secondary | ICD-10-CM | POA: Insufficient documentation

## 2013-08-02 DIAGNOSIS — F411 Generalized anxiety disorder: Secondary | ICD-10-CM | POA: Insufficient documentation

## 2013-08-02 DIAGNOSIS — K219 Gastro-esophageal reflux disease without esophagitis: Secondary | ICD-10-CM | POA: Insufficient documentation

## 2013-08-02 DIAGNOSIS — G2581 Restless legs syndrome: Secondary | ICD-10-CM | POA: Insufficient documentation

## 2013-08-02 DIAGNOSIS — M1990 Unspecified osteoarthritis, unspecified site: Secondary | ICD-10-CM | POA: Insufficient documentation

## 2013-08-02 DIAGNOSIS — I10 Essential (primary) hypertension: Secondary | ICD-10-CM | POA: Insufficient documentation

## 2013-08-02 DIAGNOSIS — E785 Hyperlipidemia, unspecified: Secondary | ICD-10-CM | POA: Insufficient documentation

## 2013-08-02 DIAGNOSIS — Z95810 Presence of automatic (implantable) cardiac defibrillator: Secondary | ICD-10-CM | POA: Insufficient documentation

## 2013-08-02 DIAGNOSIS — Z87891 Personal history of nicotine dependence: Secondary | ICD-10-CM | POA: Insufficient documentation

## 2013-08-02 DIAGNOSIS — J3489 Other specified disorders of nose and nasal sinuses: Secondary | ICD-10-CM | POA: Insufficient documentation

## 2013-08-02 DIAGNOSIS — Y929 Unspecified place or not applicable: Secondary | ICD-10-CM | POA: Insufficient documentation

## 2013-08-02 DIAGNOSIS — G4733 Obstructive sleep apnea (adult) (pediatric): Secondary | ICD-10-CM | POA: Insufficient documentation

## 2013-08-02 DIAGNOSIS — E119 Type 2 diabetes mellitus without complications: Secondary | ICD-10-CM | POA: Insufficient documentation

## 2013-08-02 DIAGNOSIS — E669 Obesity, unspecified: Secondary | ICD-10-CM | POA: Insufficient documentation

## 2013-08-02 DIAGNOSIS — Z79899 Other long term (current) drug therapy: Secondary | ICD-10-CM | POA: Insufficient documentation

## 2013-08-02 DIAGNOSIS — E032 Hypothyroidism due to medicaments and other exogenous substances: Secondary | ICD-10-CM | POA: Insufficient documentation

## 2013-08-02 DIAGNOSIS — Z794 Long term (current) use of insulin: Secondary | ICD-10-CM | POA: Insufficient documentation

## 2013-08-02 DIAGNOSIS — J449 Chronic obstructive pulmonary disease, unspecified: Secondary | ICD-10-CM | POA: Insufficient documentation

## 2013-08-02 DIAGNOSIS — Z853 Personal history of malignant neoplasm of breast: Secondary | ICD-10-CM | POA: Insufficient documentation

## 2013-08-02 DIAGNOSIS — J4489 Other specified chronic obstructive pulmonary disease: Secondary | ICD-10-CM | POA: Insufficient documentation

## 2013-08-02 DIAGNOSIS — IMO0002 Reserved for concepts with insufficient information to code with codable children: Secondary | ICD-10-CM | POA: Insufficient documentation

## 2013-08-02 DIAGNOSIS — R040 Epistaxis: Secondary | ICD-10-CM | POA: Insufficient documentation

## 2013-08-02 NOTE — ED Notes (Signed)
Pt. stated that she stuck a cotton ball at left nare this evening due to nose bleed, no bleeding and no visible cotton ball noted . Airway intact / respirations unlabored .

## 2013-08-02 NOTE — ED Provider Notes (Signed)
CSN: [id number]     Arrival date & time 08/02/13  1929 History   This chart was scribed for non-physician practitioner Raymon Mutton, PA-C, working with Doug Sou, MD, by Yevette Edwards, ED Scribe. This patient was seen in room TR05C/TR05C and the patient's care was started at 11:49 PM.  First MD Initiated Contact with Patient 08/02/13 2232     Chief Complaint  Patient presents with  . Foreign Body in Nose    The history is provided by the patient. No language interpreter was used.  HPI Comments: [name] is a 57 y.o. Female, with a h/o CHF and COPD, who presents to the Emergency Department complaining of half a piece of a cotton ball which is imbedded in her left nare. She placed the cotton in her nare to control an episode of epistaxis this morning, and she is concerned that the cotton has moved further up into her nare after inhaling deeply through her nose. She has also experienced pressure to the maxillary region and a mild headache. The epistaxis occurred approximately 12 hours ago. She denies nausea, fever, confusion, numbness/paresthesia, or SOB. The pt is a former smoker.   Dr. Veatrice Kells is her PCP.   Past Medical History  Diagnosis Date  . 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 DYSLIPIDEMIA 05/01/2009  . OBESITY 07/24/2007  . OBSTRUCTIVE SLEEP APNEA 07/24/2007    with CPAP compliance  . RESTLESS LEG SYNDROME 07/24/2007  . SYSTOLIC HEART FAILURE, CHRONIC 01/05/2009  . TACHYCARDIA 10/25/2008  . Palpitations 09/20/2010  . 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 Hypertension   . 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 Previous EF 25%. ECHO 6/08 EF 55% ECHO 5/10 EF 30-35% MRI 7/10 EF 45% Myoview 10/11 59% no ischemia  . History of breast cancer 2006    with return in 2008, s/p mastectomy, XRT and chemotherapy  . Osteoarthritis   . Anxiety   . Depression     followed by a psychiatrist  . Asthma   . GERD  (gastroesophageal reflux disease)   . Gastroparesis   . IBS (irritable bowel syndrome)   . Myocardial infarction   . Dysrhythmia     tachycardia  . ICD (implantable cardiac defibrillator) in place 11/10/2012    Dr Johney Frame  . COPD (chronic obstructive pulmonary disease)   . Headache(784.0)   . Cancer     hx of in right breast   Past Surgical History  Procedure Laterality Date  . Mastectomy      right  . Abdominal hysterectomy  1991  . Cardiac defibrillator placement  11/10/2012    SJM Fortify Assura VR implanted by Dr Johney Frame for primary prevention  . Cholecystectomy    . Wrist surgery Bilateral   . Knee cartilage surgery     Family History  Problem Relation Age of Onset  . Diabetes Mother   . Hypertension Other   . Coronary artery disease Father 82   History  Substance Use Topics  . Smoking status: Former Smoker    Quit date: 04/12/2001  . Smokeless tobacco: Never Used  . Alcohol Use: No   No OB history provided.  Review of Systems  Constitutional: Negative for fever.  HENT: Positive for congestion, nosebleeds and sinus pressure.   Gastrointestinal: Negative for nausea.  Neurological: Positive for headaches. Negative for numbness.  Psychiatric/Behavioral: Negative for confusion.  All other systems reviewed and are negative.   Allergies  Aspirin;  Ciprofloxacin; Codeine; Naproxen; Sulfonamide derivatives; and Tape  Home Medications   Current Outpatient Rx  Name  Route  Sig  Dispense  Refill  . acetaminophen (TYLENOL ARTHRITIS PAIN) 650 MG CR tablet   Oral   Take 1,300 mg by mouth every 8 (eight) hours as needed. pain         . albuterol (PROVENTIL HFA) 108 (90 BASE) MCG/ACT inhaler   Inhalation   Inhale 2 puffs into the lungs every 6 (six) hours as needed. For shortness of breath         . atorvastatin (LIPITOR) 40 MG tablet   Oral   Take 40 mg by mouth daily.           . baclofen (LIORESAL) 10 MG tablet   Oral   Take 10 mg by mouth 2 (two) times  daily as needed.         . busPIRone (BUSPAR) 30 MG tablet   Oral   Take 30 mg by mouth 2 (two) times daily.          . carvedilol (COREG) 12.5 MG tablet   Oral   Take 2 tablets (25 mg total) by mouth 2 (two) times daily with a meal.   120 tablet   5   . citalopram (CELEXA) 40 MG tablet   Oral   Take 40 mg by mouth daily.         . digoxin (LANOXIN) 0.125 MG tablet   Oral   Take 1 tablet (125 mcg total) by mouth daily.   30 tablet   6   . diphenhydramine-acetaminophen (TYLENOL PM) 25-500 MG TABS   Oral   Take 1 tablet by mouth at bedtime. For insomnia         . furosemide (LASIX) 20 MG tablet   Oral   Take 3 tablets (60 mg total) by mouth 2 (two) times daily.   180 tablet   6   . Glucosamine 500 MG CAPS   Oral   Take 1 capsule by mouth daily.           . Insulin Lispro Prot & Lispro (75-25) 100 UNIT/ML SUPN   Subcutaneous   Inject 170 Units into the skin 2 (two) times daily before a meal.         . Insulin Pen Needle (B-D ULTRAFINE III SHORT PEN) 31G X 8 MM MISC      18/day as directed dx 250.00   500 each   11   . levothyroxine (SYNTHROID, LEVOTHROID) 75 MCG tablet   Oral   Take 75 mcg by mouth daily.           Marland Kitchen loperamide (IMODIUM A-D) 2 MG tablet   Oral   Take 4 mg by mouth as needed for diarrhea or loose stools. diarrhea         . metolazone (ZAROXOLYN) 2.5 MG tablet      Take 2.5 mg as needed for weight gain.   30 tablet   3   . mometasone (NASONEX) 50 MCG/ACT nasal spray   Nasal   Place 2 sprays into the nose daily. allergies         . NOVOFINE 30G X 8 MM MISC      USE UP TO 18 TIMES DAILY AS DIRECTED.   200 each   5   . omega-3 acid ethyl esters (LOVAZA) 1 G capsule   Oral   Take 2 g by mouth 2 (two) times daily.          Marland Kitchen  pantoprazole (PROTONIX) 40 MG tablet   Oral   Take 40 mg by mouth 2 (two) times daily.           . potassium chloride SA (K-DUR,KLOR-CON) 20 MEQ tablet   Oral   Take 60 mEq by mouth 2  (two) times daily.          . ramipril (ALTACE) 10 MG capsule   Oral   Take 1 capsule (10 mg total) by mouth daily.   90 capsule   3   . rOPINIRole (REQUIP) 1 MG tablet   Oral   Take 1 mg by mouth at bedtime.           Marland Kitchen spironolactone (ALDACTONE) 25 MG tablet   Oral   Take 1 tablet (25 mg total) by mouth daily.   30 tablet   10   . SUMAtriptan (IMITREX) 25 MG tablet   Oral   Take 25 mg by mouth every 2 (two) hours as needed. For migraine         . tiotropium (SPIRIVA) 18 MCG inhalation capsule   Inhalation   Place 18 mcg into inhaler and inhale daily.           . traMADol (ULTRAM) 50 MG tablet   Oral   Take 50 mg by mouth every 6 (six) hours as needed. pain         . ACCU-CHEK FASTCLIX LANCETS MISC               . ACCU-CHEK SMARTVIEW test strip               . nitroGLYCERIN (NITROSTAT) 0.4 MG SL tablet   Sublingual   Place 1 tablet (0.4 mg total) under the tongue every 5 (five) minutes as needed. For chest pain   25 tablet   3    Triage Vitals: BP 105/45  Pulse 99  Temp(Src) 97.9 F (36.6 C) (Oral)  Resp 18  SpO2 98%  Physical Exam  Nursing note and vitals reviewed. Constitutional: She is oriented to person, place, and time. She appears well-developed and well-nourished. No distress.  HENT:  Head: Normocephalic and atraumatic.  Mouth/Throat: Oropharynx is clear and moist. No oropharyngeal exudate.  Negative pain upon palpation to maxillary and frontal sinuses  Negative septal hematoma. Cottonball identified in the left nostril. Patency normal bilaterally. Negative active bleeding.  Eyes: Conjunctivae and EOM are normal. Pupils are equal, round, and reactive to light. Right eye exhibits no discharge. Left eye exhibits no discharge.  Neck: Normal range of motion. Neck supple.  Cardiovascular: Normal rate, regular rhythm and normal heart sounds.  Exam reveals no friction rub.   No murmur heard. Pulses:      Radial pulses are 2+ on the  right side, and 2+ on the left side.  Pulmonary/Chest: Effort normal and breath sounds normal. No respiratory distress. She has no wheezes. She has no rales.  Negative respiratory distress Negative use of accessory muscles Patient is able to speak in full sentence without difficulty Airway intact  Musculoskeletal: Normal range of motion.  Full ROM to upper and lower extremities without difficulty noted, negative ataxia noted  Neurological: She is alert and oriented to person, place, and time. She exhibits normal muscle tone. Coordination normal.  Skin: Skin is warm and dry.  Psychiatric: She has a normal mood and affect. Her behavior is normal.    ED Course  Procedures (including critical care time)  DIAGNOSTIC STUDIES: Oxygen Saturation is 98% on room  air, normal by my interpretation.    COORDINATION OF CARE:  11:56 PM- Discussed treatment plan with patient, and the patient agreed to the plan.   11:59 PM- Removed cotton from pt's left nare.   Labs Review Labs Reviewed - No data to display Imaging Review No results found.  EKG Interpretation   None       MDM   1. Foreign body in nose, initial encounter    Filed Vitals:   08/02/13 1949  BP: 105/45  Pulse: 99  Temp: 97.9 F (36.6 C)  TempSrc: Oral  Resp: 18  SpO2: 98%   I personally performed the services described in this documentation, which was scribed in my presence. The recorded information has been reviewed and is accurate.  Patient presenting to emergency department with alleged cottonball stuck in her left nostril. Patient reported that she had a nosebleed at approximately 11 AM this morning which she placed cobble and the left nostril. Patient reports that when she try to slow her nose later on this afternoon there is no cottonball in size. Patient concerned of cottonball stuck in her nostril. Alert and oriented. GCS 15. Heart rate and rhythm normal. Lungs good auscultation. Negative pain upon palpation to  maxillary sinus region. Airway intact. Positive airway patency to bilateral nostrils. Cottonball identified left nostril. Cottonball removed with tweezers. Patient tolerated procedure well. Negative septal hematoma. Negative active bleeding. Patient stable, afebrile. Discharged patient. Referred patient to primary care provider. Discussed with patient not to use cotton balls, not to place, balls in nostrils for this can lead to foreign body in the nose. Discussed with patient to closely monitor symptoms and if symptoms are to worsen or change to report back to the ED - strict return instructions given.  Patient agreed to plan of care, understood, all questions answered.     Raymon Mutton, PA-C 08/03/13 1454

## 2013-08-04 NOTE — ED Provider Notes (Signed)
Medical screening examination/treatment/procedure(s) were performed by non-physician practitioner and as supervising physician I was immediately available for consultation/collaboration.  EKG Interpretation   None        Doug Sou, MD 08/04/13 1055

## 2013-08-11 ENCOUNTER — Encounter (INDEPENDENT_AMBULATORY_CARE_PROVIDER_SITE_OTHER): Payer: Self-pay

## 2013-08-13 ENCOUNTER — Ambulatory Visit: Payer: Medicare Other | Admitting: Endocrinology

## 2013-08-17 ENCOUNTER — Encounter: Payer: Medicare Other | Admitting: *Deleted

## 2013-08-20 ENCOUNTER — Ambulatory Visit: Payer: Medicare Other | Admitting: Endocrinology

## 2013-08-20 DIAGNOSIS — Z0289 Encounter for other administrative examinations: Secondary | ICD-10-CM

## 2013-08-23 ENCOUNTER — Encounter: Payer: Self-pay | Admitting: *Deleted

## 2013-09-06 ENCOUNTER — Telehealth: Payer: Self-pay | Admitting: Pulmonary Disease

## 2013-09-06 NOTE — Telephone Encounter (Signed)
Spoke with pt. She has not been seen since 2006. She is scheduled for new consult appt with [facility]. Nothing further needed

## 2013-09-13 ENCOUNTER — Other Ambulatory Visit: Payer: Self-pay

## 2013-09-13 MED ORDER — DIGOXIN 125 MCG PO TABS: 125.0000 ug | ORAL_TABLET | Freq: Every day | ORAL | Status: DC

## 2013-09-16 ENCOUNTER — Other Ambulatory Visit (HOSPITAL_COMMUNITY): Payer: Self-pay | Admitting: Cardiology

## 2013-09-16 DIAGNOSIS — I5022 Chronic systolic (congestive) heart failure: Secondary | ICD-10-CM

## 2013-09-16 MED ORDER — CARVEDILOL 12.5 MG PO TABS: 25.0000 mg | ORAL_TABLET | Freq: Two times a day (BID) | ORAL | Status: DC

## 2013-09-20 ENCOUNTER — Other Ambulatory Visit: Payer: Self-pay

## 2013-09-20 DIAGNOSIS — I5022 Chronic systolic (congestive) heart failure: Secondary | ICD-10-CM

## 2013-09-20 MED ORDER — CARVEDILOL 12.5 MG PO TABS: 25.0000 mg | ORAL_TABLET | Freq: Two times a day (BID) | ORAL | Status: DC

## 2013-09-21 ENCOUNTER — Encounter (HOSPITAL_COMMUNITY): Payer: Medicare Other

## 2013-09-29 ENCOUNTER — Encounter (HOSPITAL_COMMUNITY): Payer: Self-pay

## 2013-09-29 ENCOUNTER — Encounter (HOSPITAL_COMMUNITY): Payer: Medicare Other

## 2013-10-04 ENCOUNTER — Institutional Professional Consult (permissible substitution): Payer: Medicare Other | Admitting: Pulmonary Disease

## 2013-11-08 ENCOUNTER — Encounter: Payer: Self-pay | Admitting: *Deleted

## 2013-12-02 ENCOUNTER — Other Ambulatory Visit: Payer: Self-pay

## 2013-12-02 DIAGNOSIS — I5022 Chronic systolic (congestive) heart failure: Secondary | ICD-10-CM

## 2013-12-02 MED ORDER — FUROSEMIDE 20 MG PO TABS: 60.0000 mg | ORAL_TABLET | Freq: Two times a day (BID) | ORAL | Status: DC

## 2013-12-07 ENCOUNTER — Ambulatory Visit (INDEPENDENT_AMBULATORY_CARE_PROVIDER_SITE_OTHER): Payer: Medicare Other | Admitting: *Deleted

## 2013-12-07 ENCOUNTER — Encounter: Payer: Self-pay | Admitting: Internal Medicine

## 2013-12-07 ENCOUNTER — Telehealth (HOSPITAL_COMMUNITY): Payer: Self-pay | Admitting: Cardiology

## 2013-12-07 DIAGNOSIS — I5022 Chronic systolic (congestive) heart failure: Secondary | ICD-10-CM

## 2013-12-07 DIAGNOSIS — R000 Tachycardia, unspecified: Secondary | ICD-10-CM

## 2013-12-07 DIAGNOSIS — I498 Other specified cardiac arrhythmias: Secondary | ICD-10-CM

## 2013-12-07 LAB — MDC_IDC_ENUM_SESS_TYPE_INCLINIC
Battery Remaining Longevity: 92.4 mo
Date Time Interrogation Session: 20150505161806
HIGH POWER IMPEDANCE MEASURED VALUE: 78 Ohm
Implantable Pulse Generator Serial Number: 7066421
Lead Channel Impedance Value: 475 Ohm
Lead Channel Pacing Threshold Amplitude: 0.5 V
Lead Channel Pacing Threshold Pulse Width: 0.5 ms
Lead Channel Pacing Threshold Pulse Width: 0.5 ms
Lead Channel Sensing Intrinsic Amplitude: 12 mV
Lead Channel Setting Sensing Sensitivity: 0.5 mV
MDC IDC MSMT LEADCHNL RV PACING THRESHOLD AMPLITUDE: 0.5 V
MDC IDC SET LEADCHNL RV PACING AMPLITUDE: 2.5 V
MDC IDC SET LEADCHNL RV PACING PULSEWIDTH: 0.5 ms
MDC IDC STAT BRADY RV PERCENT PACED: 0 %
Zone Setting Detection Interval: 270 ms
Zone Setting Detection Interval: 330 ms

## 2013-12-07 NOTE — Telephone Encounter (Signed)
Pt called with concerns after defibrillator went off last night. Pt has increased fatigue. Pt states she was told to call if this ever happened

## 2013-12-07 NOTE — Progress Notes (Signed)
Pt thought she was shocked. Normal device function. Thresholds and sensing consistent with previous device measurements. Impedance trends stable over time. No evidence of any ventricular arrhythmias. Histogram distribution appropriate for patient and level of activity. No changes made this session. Device programmed at appropriate safety margins. Device programmed to optimize intrinsic conduction. Estimated longevity 7.68yrs. Pt will call Dr. Clayborne Dana office due to swollen ankles, weight gain, & DOE---pt currently taking extra furosemide & metolazone. ROV w/ JA 03/16/14.

## 2013-12-07 NOTE — Telephone Encounter (Signed)
Pt thinks she was shocked. She described the sensations as static accumulating when walking across carpet prior to discharge. Pt no longer has a landline to send a transmission.   I made device clinic appt for this afternoon.

## 2013-12-08 ENCOUNTER — Encounter (HOSPITAL_COMMUNITY): Payer: Self-pay

## 2013-12-08 ENCOUNTER — Ambulatory Visit (HOSPITAL_COMMUNITY)
Admission: RE | Admit: 2013-12-08 | Discharge: 2013-12-08 | Disposition: A | Discharge status: Home / Self Care | Payer: Medicare Other | Source: Ambulatory Visit | Attending: Internal Medicine | Admitting: Internal Medicine

## 2013-12-08 VITALS — BP 109/54 | HR 72 | Resp 22 | Wt 223.2 lb

## 2013-12-08 DIAGNOSIS — G2581 Restless legs syndrome: Secondary | ICD-10-CM | POA: Insufficient documentation

## 2013-12-08 DIAGNOSIS — M1990 Unspecified osteoarthritis, unspecified site: Secondary | ICD-10-CM | POA: Insufficient documentation

## 2013-12-08 DIAGNOSIS — I10 Essential (primary) hypertension: Secondary | ICD-10-CM | POA: Insufficient documentation

## 2013-12-08 DIAGNOSIS — R000 Tachycardia, unspecified: Secondary | ICD-10-CM | POA: Insufficient documentation

## 2013-12-08 DIAGNOSIS — Z853 Personal history of malignant neoplasm of breast: Secondary | ICD-10-CM | POA: Insufficient documentation

## 2013-12-08 DIAGNOSIS — I5022 Chronic systolic (congestive) heart failure: Secondary | ICD-10-CM | POA: Insufficient documentation

## 2013-12-08 DIAGNOSIS — I509 Heart failure, unspecified: Secondary | ICD-10-CM | POA: Insufficient documentation

## 2013-12-08 DIAGNOSIS — K3184 Gastroparesis: Secondary | ICD-10-CM | POA: Insufficient documentation

## 2013-12-08 DIAGNOSIS — G4733 Obstructive sleep apnea (adult) (pediatric): Secondary | ICD-10-CM

## 2013-12-08 DIAGNOSIS — E119 Type 2 diabetes mellitus without complications: Secondary | ICD-10-CM | POA: Insufficient documentation

## 2013-12-08 DIAGNOSIS — E669 Obesity, unspecified: Secondary | ICD-10-CM | POA: Insufficient documentation

## 2013-12-08 DIAGNOSIS — Z9221 Personal history of antineoplastic chemotherapy: Secondary | ICD-10-CM | POA: Insufficient documentation

## 2013-12-08 DIAGNOSIS — K219 Gastro-esophageal reflux disease without esophagitis: Secondary | ICD-10-CM | POA: Insufficient documentation

## 2013-12-08 DIAGNOSIS — E785 Hyperlipidemia, unspecified: Secondary | ICD-10-CM | POA: Insufficient documentation

## 2013-12-08 DIAGNOSIS — K589 Irritable bowel syndrome without diarrhea: Secondary | ICD-10-CM | POA: Insufficient documentation

## 2013-12-08 DIAGNOSIS — J449 Chronic obstructive pulmonary disease, unspecified: Secondary | ICD-10-CM | POA: Insufficient documentation

## 2013-12-08 DIAGNOSIS — Z95810 Presence of automatic (implantable) cardiac defibrillator: Secondary | ICD-10-CM | POA: Insufficient documentation

## 2013-12-08 DIAGNOSIS — R0609 Other forms of dyspnea: Secondary | ICD-10-CM

## 2013-12-08 DIAGNOSIS — R0600 Dyspnea, unspecified: Secondary | ICD-10-CM

## 2013-12-08 DIAGNOSIS — Z794 Long term (current) use of insulin: Secondary | ICD-10-CM | POA: Insufficient documentation

## 2013-12-08 DIAGNOSIS — J4489 Other specified chronic obstructive pulmonary disease: Secondary | ICD-10-CM | POA: Insufficient documentation

## 2013-12-08 DIAGNOSIS — R0989 Other specified symptoms and signs involving the circulatory and respiratory systems: Secondary | ICD-10-CM

## 2013-12-08 DIAGNOSIS — I252 Old myocardial infarction: Secondary | ICD-10-CM | POA: Insufficient documentation

## 2013-12-08 DIAGNOSIS — Z79899 Other long term (current) drug therapy: Secondary | ICD-10-CM | POA: Insufficient documentation

## 2013-12-08 LAB — BASIC METABOLIC PANEL
BUN: 16 mg/dL (ref 6–23)
CHLORIDE: 98 meq/L (ref 96–112)
CO2: 25 mEq/L (ref 19–32)
Calcium: 9.7 mg/dL (ref 8.4–10.5)
Creatinine, Ser: 0.74 mg/dL (ref 0.50–1.10)
GFR calc non Af Amer: >90 mL/min (ref >90)
Glucose, Bld: 118 mg/dL — ABNORMAL HIGH (ref 70–99)
Potassium: 4 mEq/L (ref 3.7–5.3)
Sodium: 138 mEq/L (ref 137–147)

## 2013-12-08 LAB — PRO B NATRIURETIC PEPTIDE: Pro B Natriuretic peptide (BNP): 719.5 pg/mL — ABNORMAL HIGH (ref 0–125)

## 2013-12-08 LAB — DIGOXIN LEVEL: Digoxin Level: 0.9 ng/mL (ref 0.8–2.0)

## 2013-12-08 MED ORDER — CARVEDILOL 12.5 MG PO TABS: 12.5000 mg | ORAL_TABLET | Freq: Two times a day (BID) | ORAL | Status: DC

## 2013-12-08 NOTE — Patient Instructions (Signed)
Will call with your lab results if they are abnormal.  Cut your coreg back to 12.5 mg (1 tablet) twice a day.  Call if you are not feeling better and we will discuss scheduling a right heart catheterization.  F/U 2 weeks  Do the following things EVERYDAY: 1) Weigh yourself in the morning before breakfast. Write it down and keep it in a log. 2) Take your medicines as prescribed 3) Eat low salt foods-Limit salt (sodium) to 2000 mg per day.  4) Stay as active as you can everyday 5) Limit all fluids for the day to less than 2 liters 6)

## 2013-12-08 NOTE — Progress Notes (Addendum)
Patient ID: [name], female   DOB: [birth year], 58 y.o.   MRN: [id number]  Endocrinologist: Dr Loanne Drilling  Oncologist: Dr. Humphrey Rolls PCP: [name], NP  HPI [name] is a 58 year old female with history of breast cancer, obesity, HTN, diabetes, IBS, HLD and OSA. She had chemo induced cardiomyopathy.  Her EF has fallen from 45% per Cardiac MRI in July 2010 to 20-25% in 10/2012. S/P single chamber ICD ([employer]) implantation on 11/10/12 by Dr. Rayann Heman.     Myoview in 05/2010 EF 59%. No ischemia.   Echo 08/12/11: Left ventricle: mildly dilated, EF 20% to 25%. Diffuse hypokinesis. Doppler parameters are consistent with restrictive physiology, indicative of decreased left ventricular diastolic compliance and/or increased left atrial pressure. Left atrium was moderately dilated. Right atrium mildly dilated.  Admitted in June 2013 with cardiogenic shock. Required inotropes. Discharge weight 213 pounds  RHC on 01/30/12 showed decompensated HF with low cardiac output consistent with cardiogenic shock.  [place] 6/27  RA = 20  RV = 55/13/22  PA =56/34 (44)  PCW = 28  Fick cardiac output/index = 3.1/1.5  PVR = 5.3 [place]  FA sat = 91%  PA sat = 42%, 48%  SVC sat = 46%  04/09/12 ECHO EF 30-35% LV mildly dilated  Hyopkinesis 10/07/12 ECHO EF 20-25% RV ok.  Follow up: Yesterday called device clinic concerned that she was shocked. She was brought to clinic and normal device function and no evidence of ventricular arrhythmias. Reports she has not being well for about a month, but reports she is very stressed. Increased SOB since Monday. SBP at home 100-140/50-60s. Pulse 70-90s Weight 214-219 lbs. Reports small twinges of pain in chest. Took metolazone on May 3rd and May 4th and extra lasix on 5/5. DOE with minimal movement. +heart palpitations. +nausea. Trying to follow low sodium diet but with mother being sick has been doing as well. Drinking less than 2L a day. Wears CPAP nightly and takes medications as  prescribed.  .  ROS: All systems negative except as listed in HPI, PMH and Problem List.  Past Medical History  Diagnosis Date  . 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 DYSLIPIDEMIA 05/01/2009  . OBESITY 07/24/2007  . OBSTRUCTIVE SLEEP APNEA 07/24/2007    with CPAP compliance  . RESTLESS LEG SYNDROME 07/24/2007  . SYSTOLIC HEART FAILURE, CHRONIC 01/05/2009  . TACHYCARDIA 10/25/2008  . Palpitations 09/20/2010  . 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 Hypertension   . 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 Previous EF 25%. ECHO 6/08 EF 55% ECHO 5/10 EF 30-35% MRI 7/10 EF 45% Myoview 10/11 59% no ischemia  . History of breast cancer 2006    with return in 2008, s/p mastectomy, XRT and chemotherapy  . Osteoarthritis   . Anxiety   . Depression     followed by a psychiatrist  . Asthma   . GERD (gastroesophageal reflux disease)   . Gastroparesis   . IBS (irritable bowel syndrome)   . Myocardial infarction   . Dysrhythmia     tachycardia  . ICD (implantable cardiac defibrillator) in place 11/10/2012    Dr Rayann Heman  . COPD (chronic obstructive pulmonary disease)   . Headache(784.0)   . Cancer     hx of in right breast    Current Outpatient Prescriptions  Medication Sig Dispense Refill  . ACCU-CHEK FASTCLIX LANCETS MISC       . ACCU-CHEK SMARTVIEW test  strip       . acetaminophen (TYLENOL ARTHRITIS PAIN) 650 MG CR tablet Take 1,300 mg by mouth every 8 (eight) hours as needed. pain      . albuterol (PROVENTIL HFA) 108 (90 BASE) MCG/ACT inhaler Inhale 2 puffs into the lungs every 6 (six) hours as needed. For shortness of breath      . atorvastatin (LIPITOR) 40 MG tablet Take 40 mg by mouth daily.        . baclofen (LIORESAL) 10 MG tablet Take 10 mg by mouth 2 (two) times daily as needed.      . busPIRone (BUSPAR) 30 MG tablet Take 30 mg by mouth 2 (two) times daily.       . carvedilol (COREG) 12.5 MG tablet Take 2 tablets (25 mg  total) by mouth 2 (two) times daily with a meal.  120 tablet  5  . citalopram (CELEXA) 40 MG tablet Take 40 mg by mouth daily.      . digoxin (LANOXIN) 0.125 MG tablet Take 1 tablet (125 mcg total) by mouth daily.  30 tablet  6  . diphenhydramine-acetaminophen (TYLENOL PM) 25-500 MG TABS Take 1 tablet by mouth at bedtime. For insomnia      . furosemide (LASIX) 20 MG tablet Take 3 tablets (60 mg total) by mouth 2 (two) times daily.  180 tablet  1  . Glucosamine 500 MG CAPS Take 1 capsule by mouth daily.        . Insulin Lispro Prot & Lispro (75-25) 100 UNIT/ML SUPN Inject 170 Units into the skin 2 (two) times daily before a meal.      . Insulin Pen Needle (B-D ULTRAFINE III SHORT PEN) 31G X 8 MM MISC 18/day as directed dx 250.00  500 each  11  . levothyroxine (SYNTHROID, LEVOTHROID) 75 MCG tablet Take 75 mcg by mouth daily.        Marland Kitchen loperamide (IMODIUM A-D) 2 MG tablet Take 4 mg by mouth as needed for diarrhea or loose stools. diarrhea      . metolazone (ZAROXOLYN) 2.5 MG tablet Take 2.5 mg as needed for weight gain.  30 tablet  3  . mometasone (NASONEX) 50 MCG/ACT nasal spray Place 2 sprays into the nose daily. allergies      . nitroGLYCERIN (NITROSTAT) 0.4 MG SL tablet Place 1 tablet (0.4 mg total) under the tongue every 5 (five) minutes as needed. For chest pain  25 tablet  3  . NOVOFINE 30G X 8 MM MISC USE UP TO 18 TIMES DAILY AS DIRECTED.  200 each  5  . omega-3 acid ethyl esters (LOVAZA) 1 G capsule Take 2 g by mouth 2 (two) times daily.       . pantoprazole (PROTONIX) 40 MG tablet Take 40 mg by mouth 2 (two) times daily.        . potassium chloride SA (K-DUR,KLOR-CON) 20 MEQ tablet Take 60 mEq by mouth 2 (two) times daily.       . ramipril (ALTACE) 10 MG capsule Take 1 capsule (10 mg total) by mouth daily.  90 capsule  3  . rOPINIRole (REQUIP) 1 MG tablet Take 1 mg by mouth at bedtime.        Marland Kitchen spironolactone (ALDACTONE) 25 MG tablet Take 1 tablet (25 mg total) by mouth daily.  30 tablet  10   . SUMAtriptan (IMITREX) 25 MG tablet Take 25 mg by mouth every 2 (two) hours as needed. For migraine      .  tiotropium (SPIRIVA) 18 MCG inhalation capsule Place 18 mcg into inhaler and inhale daily.        . traMADol (ULTRAM) 50 MG tablet Take 50 mg by mouth every 6 (six) hours as needed. pain       No current facility-administered medications for this encounter.   Filed Vitals:   12/08/13 1401  BP: 109/54  Pulse: 72  Resp: 22  Weight: 223 lb 4 oz (101.266 kg)  SpO2: 97%    PHYSICAL EXAM: General:  Obese, Well appearing. DOE after walking into clinic HEENT: normal except for poor dentition Neck: supple. JVP hard to see but does not appear elevated. Carotids 2+ bilaterally; no bruits. No lymphadenopathy or thryomegaly appreciated. Cor: PMI normal. Regular rate & rhythm. no S3. Lungs: clear Abdomen: soft, obese, nontender, mild distention. No hepatosplenomegaly. No bruits or masses. Good bowel sounds. Extremities: no cyanosis, clubbing, rash, 1+ edema, bilateral erythema,  Neuro: alert & orientedx3, cranial nerves grossly intact. Moves all 4 extremities w/o difficulty. Affect pleasant.  ASSESSMENT & PLAN:  1) Chronic systolic HF, NICM thought to be chemo induced, EF 20-25% RV ok (10/2012) s/p ICD - NYHA III symptoms. Volume status difficult to assess, but does not appear elevated. Will continue lasix 60 mg BID and she understands the use of sliding scale diuretics.  Volume status stable and she does use sliding scale diuretics. Check BMET and pro-BNP today.  - ICD interrogated yesterday in EP and thoracic impedence stable. - With history of low output and markedly different change in symptoms with increased fatigue and DOE with minimal exertion will cut coreg back to 12.5 mg BID. Discussed with patient to call the office if she continues to feel bad and we can schedule RHC to assess hemodynamics.  - Continue ramipril 10 mg daily, digoxin 0.125 mg daily and spiro 25 mg daily. Check dig  level today.  - Reinforced the need and importance of daily weights, a low sodium diet, and fluid restriction (less than 2 L a day). Instructed to call the HF clinic if weight increases more than 3 lbs overnight or 5 lbs in a week.  2) HTN - Stable. As above will cut BB back to see if this will help with fatigue and concerned about possible low output.  3) OSA - Wears CPAP nightly, continue to wear 4) HLD - Followed by PCP.  5) Obesity - Discussed with her trying to get back on her diet and to watch her portion sizes.   F/U 2 weeks Rande Brunt NP-C 2:19 PM

## 2013-12-22 ENCOUNTER — Encounter (HOSPITAL_COMMUNITY): Payer: Self-pay

## 2013-12-22 ENCOUNTER — Inpatient Hospital Stay (HOSPITAL_COMMUNITY): Admission: RE | Admit: 2013-12-22 | Payer: Medicare Other | Source: Ambulatory Visit

## 2014-01-05 ENCOUNTER — Other Ambulatory Visit: Payer: Self-pay | Admitting: Endocrinology

## 2014-02-03 ENCOUNTER — Other Ambulatory Visit: Payer: Self-pay

## 2014-02-03 DIAGNOSIS — I5022 Chronic systolic (congestive) heart failure: Secondary | ICD-10-CM

## 2014-02-03 MED ORDER — FUROSEMIDE 20 MG PO TABS: 60.0000 mg | ORAL_TABLET | Freq: Two times a day (BID) | ORAL | Status: DC

## 2014-02-10 ENCOUNTER — Telehealth: Payer: Self-pay | Admitting: Hematology and Oncology

## 2014-02-10 NOTE — Telephone Encounter (Signed)
, °

## 2014-02-17 ENCOUNTER — Encounter (HOSPITAL_COMMUNITY): Payer: Self-pay

## 2014-02-17 ENCOUNTER — Ambulatory Visit (HOSPITAL_COMMUNITY)
Admission: RE | Admit: 2014-02-17 | Discharge: 2014-02-17 | Disposition: A | Discharge status: Home / Self Care | Payer: Medicare Other | Source: Ambulatory Visit | Attending: Anesthesiology | Admitting: Anesthesiology

## 2014-02-17 VITALS — BP 131/64 | HR 98 | Resp 20 | Wt 224.1 lb

## 2014-02-17 DIAGNOSIS — I252 Old myocardial infarction: Secondary | ICD-10-CM | POA: Diagnosis not present

## 2014-02-17 DIAGNOSIS — E785 Hyperlipidemia, unspecified: Secondary | ICD-10-CM | POA: Insufficient documentation

## 2014-02-17 DIAGNOSIS — I10 Essential (primary) hypertension: Secondary | ICD-10-CM | POA: Insufficient documentation

## 2014-02-17 DIAGNOSIS — I2510 Atherosclerotic heart disease of native coronary artery without angina pectoris: Secondary | ICD-10-CM | POA: Diagnosis not present

## 2014-02-17 DIAGNOSIS — K219 Gastro-esophageal reflux disease without esophagitis: Secondary | ICD-10-CM | POA: Insufficient documentation

## 2014-02-17 DIAGNOSIS — E039 Hypothyroidism, unspecified: Secondary | ICD-10-CM | POA: Diagnosis not present

## 2014-02-17 DIAGNOSIS — Z9221 Personal history of antineoplastic chemotherapy: Secondary | ICD-10-CM | POA: Diagnosis not present

## 2014-02-17 DIAGNOSIS — I5022 Chronic systolic (congestive) heart failure: Secondary | ICD-10-CM | POA: Diagnosis not present

## 2014-02-17 DIAGNOSIS — J449 Chronic obstructive pulmonary disease, unspecified: Secondary | ICD-10-CM | POA: Diagnosis not present

## 2014-02-17 DIAGNOSIS — E119 Type 2 diabetes mellitus without complications: Secondary | ICD-10-CM | POA: Diagnosis not present

## 2014-02-17 DIAGNOSIS — G4733 Obstructive sleep apnea (adult) (pediatric): Secondary | ICD-10-CM | POA: Diagnosis not present

## 2014-02-17 DIAGNOSIS — E669 Obesity, unspecified: Secondary | ICD-10-CM | POA: Diagnosis not present

## 2014-02-17 DIAGNOSIS — Z853 Personal history of malignant neoplasm of breast: Secondary | ICD-10-CM | POA: Insufficient documentation

## 2014-02-17 DIAGNOSIS — E1049 Type 1 diabetes mellitus with other diabetic neurological complication: Secondary | ICD-10-CM

## 2014-02-17 DIAGNOSIS — J4489 Other specified chronic obstructive pulmonary disease: Secondary | ICD-10-CM | POA: Insufficient documentation

## 2014-02-17 DIAGNOSIS — I2584 Coronary atherosclerosis due to calcified coronary lesion: Secondary | ICD-10-CM

## 2014-02-17 DIAGNOSIS — Z79899 Other long term (current) drug therapy: Secondary | ICD-10-CM | POA: Diagnosis not present

## 2014-02-17 MED ORDER — CARVEDILOL 12.5 MG PO TABS: ORAL_TABLET | ORAL | Status: DC

## 2014-02-17 NOTE — Patient Instructions (Signed)
Doing great.  We are thinking about you and praying for you during this time.  Increase your coreg to 12.5 mg (1 tablet) in the morning and 18.75 (1 1/2 tablets) in the evening.  Call any dizziness or increase in SOB.  F/U 6 weeks with ECHO  Do the following things EVERYDAY: 1) Weigh yourself in the morning before breakfast. Write it down and keep it in a log. 2) Take your medicines as prescribed 3) Eat low salt foods-Limit salt (sodium) to 2000 mg per day.  4) Stay as active as you can everyday 5) Limit all fluids for the day to less than 2 liters 6)

## 2014-02-17 NOTE — Progress Notes (Addendum)
Patient ID: [name], female   DOB: 1955-10-29, 58 y.o.   MRN: [id number]  Endocrinologist: Dr Loanne Drilling  Oncologist: Dr. Humphrey Rolls PCP: [name], NP  HPI Ms. Chipman is a 58 year old female with history of breast cancer, obesity, HTN, DM, IBS, HLD, OSA, chemo induced cardiomyopathy s/p ICD and chronic systolic HF.    She had chemo induced cardiomyopathy.  Her EF has fallen from 45% per Cardiac MRI in July 2010 to 20-25% in 10/2012. S/P single chamber ICD (St Jude) implantation on 11/10/12 by Dr. Rayann Heman.     Myoview in 05/2010 EF 59%. No ischemia.   Echo 08/12/11: Left ventricle: mildly dilated, EF 20% to 25%. Diffuse hypokinesis. Doppler parameters are consistent with restrictive physiology, indicative of decreased left ventricular diastolic compliance and/or increased left atrial pressure. Left atrium was moderately dilated. Right atrium mildly dilated.  Admitted in June 2013 with cardiogenic shock. Required inotropes. Discharge weight 213 pounds  RHC on 01/30/12 showed decompensated HF with low cardiac output consistent with cardiogenic shock.  [place] 6/27  RA = 20  RV = 55/13/22  PA =56/34 (44)  PCW = 28  Fick cardiac output/index = 3.1/1.5  PVR = 5.3 [place]  FA sat = 91%  PA sat = 42%, 48%  SVC sat = 46%  04/09/12 ECHO EF 30-35% LV mildly dilated  Hyopkinesis 10/07/12 ECHO EF 20-25% RV ok.  Follow up for Heart Failure: Last visit coreg cut back to 12.5 mg BID. Denies dizziness, CP, PND or orthopnea. Wearing CPAP at night. +minimal edema. Has not weighed in 2 months. Mother is dying and has had some difficulties with adjusting to this. +SOB with minimal exertion. No longer having nausea. Trying to follow a low salt diet and drink less than 2L a day.    ROS: All systems negative except as listed in HPI, PMH and Problem List.  Past Medical History  Diagnosis Date  . 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 DYSLIPIDEMIA 05/01/2009  . OBESITY 07/24/2007  . OBSTRUCTIVE SLEEP APNEA 07/24/2007    with CPAP  compliance  . RESTLESS LEG SYNDROME 07/24/2007  . SYSTOLIC HEART FAILURE, CHRONIC 01/05/2009    a. chemo induced cardiomyopathy b. cMRI (02/2009) EF 45% c. Myoview 05/2010: EF 59%, no ischemia d. RHC (01/2012) RA 20, RV 55/13/22, PA 56/34 (44), PCWP 28, Fick CO/CI: 3.1 / 1.5, PVR 5.3 WU, PA sat 42% & 48% e. ECHO (10/2012) EF 20-25%, diff HK, MV calcified annulus  . Palpitations 09/20/2010  . 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 Hypertension   . History of breast cancer 2006    with return in 2008, s/p mastectomy, XRT and chemotherapy  . Osteoarthritis   . Anxiety   . Depression     followed by a psychiatrist  . Asthma   . GERD (gastroesophageal reflux disease)   . Gastroparesis   . IBS (irritable bowel syndrome)   . Myocardial infarction   . ICD (implantable cardiac defibrillator) in place 11/10/2012    ICD (11/2012)  . COPD (chronic obstructive pulmonary disease)     Current Outpatient Prescriptions  Medication Sig Dispense Refill  . ACCU-CHEK FASTCLIX LANCETS MISC       . ACCU-CHEK SMARTVIEW test strip       . acetaminophen (TYLENOL ARTHRITIS PAIN) 650 MG CR tablet Take 1,300 mg by mouth every 8 (eight) hours as needed. pain      . albuterol (PROVENTIL HFA) 108 (90 BASE) MCG/ACT inhaler Inhale 2 puffs into the lungs every 6 (six) hours as  needed. For shortness of breath      . atorvastatin (LIPITOR) 40 MG tablet Take 40 mg by mouth daily.        . baclofen (LIORESAL) 10 MG tablet Take 10 mg by mouth 2 (two) times daily as needed.      . busPIRone (BUSPAR) 30 MG tablet Take 30 mg by mouth 2 (two) times daily.       . carvedilol (COREG) 12.5 MG tablet Take 1 tablet (12.5 mg total) by mouth 2 (two) times daily with a meal.  60 tablet  3  . citalopram (CELEXA) 40 MG tablet Take 40 mg by mouth daily.      . digoxin (LANOXIN) 0.125 MG tablet Take 1 tablet (125 mcg total) by mouth daily.  30 tablet  6  . diphenhydramine-acetaminophen (TYLENOL PM) 25-500 MG TABS Take 1 tablet by mouth at  bedtime. For insomnia      . furosemide (LASIX) 20 MG tablet Take 3 tablets (60 mg total) by mouth 2 (two) times daily.  180 tablet  1  . Glucosamine 500 MG CAPS Take 1 capsule by mouth daily.        . Insulin Lispro Prot & Lispro (75-25) 100 UNIT/ML SUPN Inject 170 Units into the skin 2 (two) times daily before a meal.      . Insulin Pen Needle (B-D ULTRAFINE III SHORT PEN) 31G X 8 MM MISC 18/day as directed dx 250.00  500 each  11  . levothyroxine (SYNTHROID, LEVOTHROID) 75 MCG tablet Take 75 mcg by mouth daily.        Marland Kitchen loperamide (IMODIUM A-D) 2 MG tablet Take 4 mg by mouth as needed for diarrhea or loose stools. diarrhea      . metolazone (ZAROXOLYN) 2.5 MG tablet Take 2.5 mg as needed for weight gain.  30 tablet  3  . mometasone (NASONEX) 50 MCG/ACT nasal spray Place 2 sprays into the nose daily. allergies      . nitroGLYCERIN (NITROSTAT) 0.4 MG SL tablet Place 1 tablet (0.4 mg total) under the tongue every 5 (five) minutes as needed. For chest pain  25 tablet  3  . NOVOFINE 30G X 8 MM MISC USE UP TO 18 TIMES DAILY AS DIRECTED.  200 each  0  . omega-3 acid ethyl esters (LOVAZA) 1 G capsule Take 2 g by mouth 2 (two) times daily.       . pantoprazole (PROTONIX) 40 MG tablet Take 40 mg by mouth 2 (two) times daily.        . potassium chloride SA (K-DUR,KLOR-CON) 20 MEQ tablet Take 60 mEq by mouth 2 (two) times daily.       . ramipril (ALTACE) 10 MG capsule Take 1 capsule (10 mg total) by mouth daily.  90 capsule  3  . rOPINIRole (REQUIP) 1 MG tablet Take 1 mg by mouth at bedtime.        Marland Kitchen spironolactone (ALDACTONE) 25 MG tablet Take 1 tablet (25 mg total) by mouth daily.  30 tablet  10  . SUMAtriptan (IMITREX) 25 MG tablet Take 25 mg by mouth every 2 (two) hours as needed. For migraine      . tiotropium (SPIRIVA) 18 MCG inhalation capsule Place 18 mcg into inhaler and inhale daily.        . traMADol (ULTRAM) 50 MG tablet Take 50 mg by mouth every 6 (six) hours as needed. pain       No current  facility-administered medications for this encounter.  Filed Vitals:   02/17/14 1443  BP: 131/64  Pulse: 98  Resp: 20  Weight: 224 lb 2 oz (101.662 kg)  SpO2: 97%    PHYSICAL EXAM: General:  Obese, Well appearing. DOE after walking into clinic HEENT: normal except for poor dentition Neck: supple. JVP hard to see but appears mildly elevated. Carotids 2+ bilaterally; no bruits. No lymphadenopathy or thryomegaly appreciated. Cor: PMI normal. Regular rate & rhythm. no S3. Lungs: clear Abdomen: soft, obese, nontender, mild distention. No hepatosplenomegaly. No bruits or masses. Good bowel sounds. Extremities: no cyanosis, clubbing, rash, trace bilateral edema, bilateral erythema,  Neuro: alert & orientedx3, cranial nerves grossly intact. Moves all 4 extremities w/o difficulty. Affect pleasant.  ASSESSMENT & PLAN:  1) Chronic systolic HF, NICM thought to be chemo induced, EF 20-25% RV ok (10/2012) s/p ICD - NYHA III symptoms and volume status mildly elevated. Will have patient take extra 20 mg of lasix today and tomorrow and then go back to 60 mg BID. - Will increase coreg to 12.5 mg q am and 18.75 mg q pm. Instructed to call if any dizziness.  - Continue ramipril 10 mg daily and spiro 25 mg daily. - Last dig level 0.9 (12/2013), will cut digoxin back to 0.125 mg QOD. - Reinforced the need and importance of daily weights, a low sodium diet, and fluid restriction (less than 2 L a day). Instructed to call the HF clinic if weight increases more than 3 lbs overnight or 5 lbs in a week.  2) HTN - Stable. As above will increase BB for LV dysfunction.  3) OSA - Wears CPAP nightly, continue to wear 4) HLD - Followed by PCP.  5) Obesity - Discussed with her trying to get back on her diet and to watch her portion sizes. She is having a hard time right now with mother actively dying and said she will try to do her best.  6) DM2 - Reports she has not been checking her sugars for 2 months because  she did not have strips. Went to PCP yesterday and now will restart. Discussed the importance of blood sugar control.  7) CAD - mild nonobstructive CAD on 2009 cath. Will continue statin, BB and ACE-I. No s/s of ischemia.  F/U 6 weeks with ECHO Junie Bame B NP-C 3:12 PM

## 2014-02-22 ENCOUNTER — Ambulatory Visit (INDEPENDENT_AMBULATORY_CARE_PROVIDER_SITE_OTHER): Payer: Medicare Other | Admitting: Endocrinology

## 2014-02-22 ENCOUNTER — Encounter: Payer: Self-pay | Admitting: Endocrinology

## 2014-02-22 VITALS — BP 120/60 | HR 98 | Temp 98.2°F | Ht 65.0 in | Wt 220.0 lb

## 2014-02-22 DIAGNOSIS — E1049 Type 1 diabetes mellitus with other diabetic neurological complication: Secondary | ICD-10-CM

## 2014-02-22 LAB — MICROALBUMIN / CREATININE URINE RATIO
Creatinine,U: 45.4 mg/dL
MICROALB UR: 0.3 mg/dL (ref 0.0–1.9)
Microalb Creat Ratio: 0.7 mg/g (ref 0.0–30.0)

## 2014-02-22 NOTE — Progress Notes (Signed)
Subjective:    Patient ID: [name], female    DOB: 22-Aug-1955, 58 y.o.   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  She does not check cbg's.  pt states she feels well in general, except for depression related to her mother's poor health.   Past Medical History  Diagnosis Date  . 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 DYSLIPIDEMIA 05/01/2009  . OBESITY 07/24/2007  . OBSTRUCTIVE SLEEP APNEA 07/24/2007    with CPAP compliance  . RESTLESS LEG SYNDROME 07/24/2007  . SYSTOLIC HEART FAILURE, CHRONIC 01/05/2009    a. chemo induced cardiomyopathy b. cMRI (02/2009) EF 45% c. Myoview 05/2010: EF 59%, no ischemia d. RHC (01/2012) RA 20, RV 55/13/22, PA 56/34 (44), PCWP 28, Fick CO/CI: 3.1 / 1.5, PVR 5.3 WU, PA sat 42% & 48% e. ECHO (10/2012) EF 20-25%, diff HK, MV calcified annulus  . Palpitations 09/20/2010  . 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 Hypertension   . History of breast cancer 2006    with return in 2008, s/p mastectomy, XRT and chemotherapy  . Osteoarthritis   . Anxiety   . Depression     followed by a psychiatrist  . Asthma   . GERD (gastroesophageal reflux disease)   . Gastroparesis   . IBS (irritable bowel syndrome)   . Myocardial infarction   . ICD (implantable cardiac defibrillator) in place 11/10/2012    ICD (11/2012)  . COPD (chronic obstructive pulmonary disease)     Past Surgical History  Procedure Laterality Date  . Mastectomy      right  . Abdominal hysterectomy  1991  . Cardiac defibrillator placement  11/10/2012    SJM Fortify Assura VR implanted by Dr Rayann Heman for primary prevention  . Cholecystectomy    . Wrist surgery Bilateral   . Knee cartilage  surgery      History   Social History  . Marital Status: Divorced    Spouse Name: N/A    Number of Children: N/A  . Years of Education: N/A   Occupational History  . Disabled    Social History Main Topics  . Smoking status: Former Smoker    Quit date: 04/12/2001  . Smokeless tobacco: Never Used  . Alcohol Use: No  . Drug Use: No  . Sexual Activity: Not Currently   Other Topics Concern  . Not on file   Social History Narrative   Lives with mother and brother Warden Fillers.   Regular exercise-no    Current Outpatient Prescriptions on File Prior to Visit  Medication Sig Dispense Refill  . ACCU-CHEK FASTCLIX LANCETS MISC       . ACCU-CHEK SMARTVIEW test strip       . acetaminophen (TYLENOL ARTHRITIS PAIN) 650 MG CR tablet Take 1,300 mg by mouth every 8 (eight) hours as needed. pain      . albuterol (PROVENTIL HFA) 108 (90 BASE) MCG/ACT inhaler Inhale 2 puffs into the lungs every 6 (six) hours as needed. For shortness of breath      . atorvastatin (LIPITOR) 40 MG tablet Take 40 mg by mouth daily.        . baclofen (LIORESAL) 10 MG tablet Take 10 mg by mouth 2 (two) times daily  as needed.      . busPIRone (BUSPAR) 30 MG tablet Take 30 mg by mouth 2 (two) times daily.       . carvedilol (COREG) 12.5 MG tablet Take 12.5 mg (1 tablet) in the morning and 18.75 mg (1 1/2 tablets) in the evening.  75 tablet  3  . citalopram (CELEXA) 40 MG tablet Take 40 mg by mouth daily.      . digoxin (LANOXIN) 0.125 MG tablet Take 1 tablet (125 mcg total) by mouth daily.  30 tablet  6  . diphenhydramine-acetaminophen (TYLENOL PM) 25-500 MG TABS Take 1 tablet by mouth at bedtime. For insomnia      . furosemide (LASIX) 20 MG tablet Take 3 tablets (60 mg total) by mouth 2 (two) times daily.  180 tablet  1  . Glucosamine 500 MG CAPS Take 1 capsule by mouth daily.        . Insulin Lispro Prot & Lispro (75-25) 100 UNIT/ML SUPN Inject 180 Units into the skin 2 (two) times daily before a meal.       .  Insulin Pen Needle (B-D ULTRAFINE III SHORT PEN) 31G X 8 MM MISC 18/day as directed dx 250.00  500 each  11  . levothyroxine (SYNTHROID, LEVOTHROID) 75 MCG tablet Take 75 mcg by mouth daily.        Marland Kitchen loperamide (IMODIUM A-D) 2 MG tablet Take 4 mg by mouth as needed for diarrhea or loose stools. diarrhea      . metolazone (ZAROXOLYN) 2.5 MG tablet Take 2.5 mg as needed for weight gain.  30 tablet  3  . mometasone (NASONEX) 50 MCG/ACT nasal spray Place 2 sprays into the nose daily. allergies      . nitroGLYCERIN (NITROSTAT) 0.4 MG SL tablet Place 1 tablet (0.4 mg total) under the tongue every 5 (five) minutes as needed. For chest pain  25 tablet  3  . NOVOFINE 30G X 8 MM MISC USE UP TO 18 TIMES DAILY AS DIRECTED.  200 each  0  . omega-3 acid ethyl esters (LOVAZA) 1 G capsule Take 2 g by mouth 2 (two) times daily.       . pantoprazole (PROTONIX) 40 MG tablet Take 40 mg by mouth 2 (two) times daily.        . potassium chloride SA (K-DUR,KLOR-CON) 20 MEQ tablet Take 60 mEq by mouth 2 (two) times daily.       . ramipril (ALTACE) 10 MG capsule Take 1 capsule (10 mg total) by mouth daily.  90 capsule  3  . rOPINIRole (REQUIP) 1 MG tablet Take 1 mg by mouth at bedtime.        Marland Kitchen spironolactone (ALDACTONE) 25 MG tablet Take 1 tablet (25 mg total) by mouth daily.  30 tablet  10  . SUMAtriptan (IMITREX) 25 MG tablet Take 25 mg by mouth every 2 (two) hours as needed. For migraine      . tiotropium (SPIRIVA) 18 MCG inhalation capsule Place 18 mcg into inhaler and inhale daily.        . traMADol (ULTRAM) 50 MG tablet Take 50 mg by mouth every 6 (six) hours as needed. pain       No current facility-administered medications on file prior to visit.    Allergies  Allergen Reactions  . Aspirin Hives  . Ciprofloxacin Hives  . Codeine Nausea Only    "can take ONLY if she eats with med"  . Naproxen Hives  . Sulfonamide Derivatives Hives  .  Tape Rash    Also allergic to metal    Family History  Problem  Relation Age of Onset  . Diabetes Mother   . Hypertension Other   . Coronary artery disease Father 61    BP 120/60  Pulse 98  Temp(Src) 98.2 F (36.8 C) (Oral)  Ht 5\' 5"  (1.651 m)  Wt 220 lb (99.791 kg)  BMI 36.61 kg/m2  SpO2 97%  Review of Systems She denies hypoglycemia and weight change.      Objective:   Physical Exam Pulses: dorsalis pedis intact bilat.   Feet: no deformity. normal color and temp.  no edema Skin:  no ulcer on the feet.   Neuro: sensation is intact to touch on the feet, but decreased from normal.     outside test results are reviewed: A1c=8.7%      Assessment & Plan:  DM: moderate exacerbation Difficult social situation.  This impairs the ability to achieve glycemic control.  I'll work around this as best I can.   Patient is advised the following: Patient Instructions  Please increase humalog 75/25 to 180 units 2x a day (with 1st and last meals of the day).   A urine test is requested for you today.  We'll contact you with results. Please make a follow-up appointment in 3 months.  check your blood sugar 2 times a day.  vary the time of day when you check, between before the 3 meals, and at bedtime.  also check if you have symptoms of your blood sugar being too high or too low.  please keep a record of the readings and bring it to your next appointment here.  please call us sooner if you are having low blood sugar episodes, or if it stays over 200.

## 2014-02-22 NOTE — Patient Instructions (Addendum)
Please increase humalog 75/25 to 180 units 2x a day (with 1st and last meals of the day).   A urine test is requested for you today.  We'll contact you with results. Please make a follow-up appointment in 3 months.  check your blood sugar 2 times a day.  vary the time of day when you check, between before the 3 meals, and at bedtime.  also check if you have symptoms of your blood sugar being too high or too low.  please keep a record of the readings and bring it to your next appointment here.  please call us sooner if you are having low blood sugar episodes, or if it stays over 200.

## 2014-02-28 ENCOUNTER — Ambulatory Visit: Payer: Medicare Other | Admitting: Oncology

## 2014-02-28 ENCOUNTER — Other Ambulatory Visit: Payer: Medicare Other

## 2014-03-03 ENCOUNTER — Telehealth: Payer: Self-pay

## 2014-03-03 NOTE — Telephone Encounter (Signed)
Diabetic Bundle. Lvom for pt to schedule 3 moth follow up with Dr. Loanne Drilling.

## 2014-03-08 ENCOUNTER — Other Ambulatory Visit: Payer: Self-pay

## 2014-03-08 DIAGNOSIS — R002 Palpitations: Secondary | ICD-10-CM

## 2014-03-08 MED ORDER — NITROGLYCERIN 0.4 MG SL SUBL: 0.4000 mg | SUBLINGUAL_TABLET | SUBLINGUAL | Status: DC | PRN

## 2014-03-16 ENCOUNTER — Ambulatory Visit (INDEPENDENT_AMBULATORY_CARE_PROVIDER_SITE_OTHER): Payer: Medicare Other | Admitting: Internal Medicine

## 2014-03-16 ENCOUNTER — Encounter: Payer: Self-pay | Admitting: Internal Medicine

## 2014-03-16 VITALS — BP 96/62 | HR 96 | Ht 60.0 in | Wt 225.0 lb

## 2014-03-16 DIAGNOSIS — R570 Cardiogenic shock: Secondary | ICD-10-CM

## 2014-03-16 DIAGNOSIS — I10 Essential (primary) hypertension: Secondary | ICD-10-CM

## 2014-03-16 DIAGNOSIS — I5023 Acute on chronic systolic (congestive) heart failure: Secondary | ICD-10-CM

## 2014-03-16 DIAGNOSIS — I5022 Chronic systolic (congestive) heart failure: Secondary | ICD-10-CM

## 2014-03-16 LAB — MDC_IDC_ENUM_SESS_TYPE_INCLINIC
HighPow Impedance: 84.375
Implantable Pulse Generator Serial Number: 7066421
Lead Channel Pacing Threshold Amplitude: 0.5 V
Lead Channel Pacing Threshold Amplitude: 0.5 V
Lead Channel Pacing Threshold Pulse Width: 0.5 ms
Lead Channel Pacing Threshold Pulse Width: 0.5 ms
Lead Channel Setting Pacing Amplitude: 2.5 V
Lead Channel Setting Pacing Pulse Width: 0.5 ms
Lead Channel Setting Sensing Sensitivity: 0.5 mV
MDC IDC MSMT BATTERY REMAINING LONGEVITY: 88.8 mo
MDC IDC MSMT LEADCHNL RV IMPEDANCE VALUE: 462.5 Ohm
MDC IDC MSMT LEADCHNL RV SENSING INTR AMPL: 12 mV
MDC IDC SESS DTM: 20150812153914
MDC IDC SET ZONE DETECTION INTERVAL: 270 ms
MDC IDC SET ZONE DETECTION INTERVAL: 330 ms
MDC IDC STAT BRADY RV PERCENT PACED: 0 %

## 2014-03-16 NOTE — Progress Notes (Signed)
PCP: Joanne Dress, MD Primary Cardiologist:  [name] is a 58 y.o. female who presents today for routine electrophysiology followup.  Since her last visit to our practice, the patient reports doing very well.  She is primarily concerned with her mothers terminal illness.  She spends much of her time dedicated to her mothers care.  Because of this, she says that she is neglecting her own health.  She reports unhealthy diet and lifestyle.  Today, she denies symptoms of palpitations, chest pain, shortness of breath,  lower extremity edema, dizziness, presyncope, syncope, or ICD shocks.  The patient is otherwise without complaint today.   Past Medical History  Diagnosis Date  . 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 DYSLIPIDEMIA 05/01/2009  . OBESITY 07/24/2007  . OBSTRUCTIVE SLEEP APNEA 07/24/2007    with CPAP compliance  . RESTLESS LEG SYNDROME 07/24/2007  . SYSTOLIC HEART FAILURE, CHRONIC 01/05/2009    a. chemo induced cardiomyopathy b. cMRI (02/2009) EF 45% c. Myoview 05/2010: EF 59%, no ischemia d. RHC (01/2012) RA 20, RV 55/13/22, PA 56/34 (44), PCWP 28, Fick CO/CI: 3.1 / 1.5, PVR 5.3 WU, PA sat 42% & 48% e. ECHO (10/2012) EF 20-25%, diff HK, MV calcified annulus  . Palpitations 09/20/2010  . 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 Hypertension   . History of breast cancer 2006    with return in 2008, s/p mastectomy, XRT and chemotherapy  . Osteoarthritis   . Anxiety   . Depression     followed by a psychiatrist  . Asthma   . GERD (gastroesophageal reflux disease)   . Gastroparesis   . IBS (irritable bowel syndrome)   . Myocardial infarction   . ICD (implantable cardiac defibrillator) in place 11/10/2012    ICD (11/2012)  . COPD (chronic obstructive pulmonary disease)    Past Surgical History  Procedure Laterality Date  . Mastectomy      right  . Abdominal hysterectomy  1991  . Cardiac defibrillator placement  11/10/2012    SJM Fortify Assura VR implanted by Joanne Rayann Heman for  primary prevention  . Cholecystectomy    . Wrist surgery Bilateral   . Knee cartilage surgery      Current Outpatient Prescriptions  Medication Sig Dispense Refill  . ACCU-CHEK FASTCLIX LANCETS MISC       . ACCU-CHEK SMARTVIEW test strip       . acetaminophen (TYLENOL ARTHRITIS PAIN) 650 MG CR tablet Take 1,300 mg by mouth every 8 (eight) hours as needed. pain      . albuterol (PROVENTIL HFA) 108 (90 BASE) MCG/ACT inhaler Inhale 2 puffs into the lungs every 6 (six) hours as needed. For shortness of breath      . atorvastatin (LIPITOR) 40 MG tablet Take 40 mg by mouth daily.        . baclofen (LIORESAL) 10 MG tablet Take 10 mg by mouth 2 (two) times daily as needed.      . busPIRone (BUSPAR) 30 MG tablet Take 30 mg by mouth 2 (two) times daily.       . carvedilol (COREG) 12.5 MG tablet Take 12.5 mg (1 tablet) in the morning and 18.75 mg (1 1/2 tablets) in the evening.  75 tablet  3  . citalopram (CELEXA) 40 MG tablet Take 40 mg by mouth daily.      . digoxin (LANOXIN) 0.125 MG tablet Take 1 tablet (125 mcg total) by mouth daily.  30 tablet  6  . furosemide (LASIX) 20 MG tablet Take  3 tablets (60 mg total) by mouth 2 (two) times daily.  180 tablet  1  . Glucosamine 500 MG CAPS Take 1 capsule by mouth daily.        . Insulin Lispro Prot & Lispro (75-25) 100 UNIT/ML SUPN Inject 180 Units into the skin 2 (two) times daily before a meal.       . Insulin Pen Needle (B-D ULTRAFINE III SHORT PEN) 31G X 8 MM MISC 18/day as directed dx 250.00  500 each  11  . levothyroxine (SYNTHROID, LEVOTHROID) 75 MCG tablet Take 75 mcg by mouth daily.        Marland Kitchen loperamide (IMODIUM A-D) 2 MG tablet Take 4 mg by mouth as needed for diarrhea or loose stools. diarrhea      . Melatonin 3 MG CAPS Take 0.5 capsules by mouth at bedtime.      . metolazone (ZAROXOLYN) 2.5 MG tablet Take 2.5 mg as needed for weight gain.  30 tablet  3  . mometasone (NASONEX) 50 MCG/ACT nasal spray Place 2 sprays into the nose daily. allergies       . nitroGLYCERIN (NITROSTAT) 0.4 MG SL tablet Place 1 tablet (0.4 mg total) under the tongue every 5 (five) minutes as needed. For chest pain  25 tablet  3  . NOVOFINE 30G X 8 MM MISC USE UP TO 18 TIMES DAILY AS DIRECTED.  200 each  0  . omega-3 acid ethyl esters (LOVAZA) 1 G capsule Take 2 g by mouth 2 (two) times daily.       . pantoprazole (PROTONIX) 40 MG tablet Take 40 mg by mouth 2 (two) times daily.        . potassium chloride SA (K-DUR,KLOR-CON) 20 MEQ tablet Take 60 mEq by mouth 2 (two) times daily.       . ramipril (ALTACE) 10 MG capsule Take 1 capsule (10 mg total) by mouth daily.  90 capsule  3  . rOPINIRole (REQUIP) 1 MG tablet Take 1 mg by mouth at bedtime.        Marland Kitchen spironolactone (ALDACTONE) 25 MG tablet Take 1 tablet (25 mg total) by mouth daily.  30 tablet  10  . SUMAtriptan (IMITREX) 25 MG tablet Take 25 mg by mouth every 2 (two) hours as needed. For migraine      . tiotropium (SPIRIVA) 18 MCG inhalation capsule Place 18 mcg into inhaler and inhale daily.        . traMADol (ULTRAM) 50 MG tablet Take 50 mg by mouth every 6 (six) hours as needed. pain       No current facility-administered medications for this visit.    Physical Exam: Filed Vitals:   03/16/14 1509  BP: 96/62  Pulse: 96  Height: 5' (1.524 m)  Weight: 225 lb (102.059 kg)    GEN- The patient is overweight appearing, alert and oriented x 3 today.   Head- normocephalic, atraumatic Eyes-  Sclera clear, conjunctiva pink Ears- hearing intact Oropharynx- clear Lungs- Clear to ausculation bilaterally, normal work of breathing Chest- ICD pocket is well healed Heart- Regular rate and rhythm, no murmurs, rubs or gallops, PMI not laterally displaced GI- soft, NT, ND, + BS Extremities- no clubbing, cyanosis, or edema  ICD interrogation- reviewed in detail today,  See PACEART report  Assessment and Plan:  1.  Chronic systolic dysfunction euvolemic today Stable on an appropriate medical regimen Normal ICD  function See Pace Art report No changes today Dietary compliance is encouraged  2. HTN Stable  No change required today   Merlin Return in 12 months

## 2014-03-16 NOTE — Patient Instructions (Signed)
Remote monitoring is used to monitor your ICD from home. This monitoring reduces the number of office visits required to check your device to one time per year. It allows [place] to keep an eye on the functioning of your device to ensure it is working properly. You are scheduled for a device check from home on 06-20-2014. You may send your transmission at any time that day. If you have a wireless device, the transmission will be sent automatically. After your physician reviews your transmission, you will receive a postcard with your next transmission date.  Your physician recommends that you schedule a follow-up appointment in: 12 months with Dr.Allred

## 2014-03-18 ENCOUNTER — Other Ambulatory Visit: Payer: Self-pay

## 2014-03-18 MED ORDER — SPIRONOLACTONE 25 MG PO TABS: 25.0000 mg | ORAL_TABLET | Freq: Every day | ORAL | Status: DC

## 2014-03-30 ENCOUNTER — Telehealth: Payer: Self-pay | Admitting: *Deleted

## 2014-03-30 NOTE — Progress Notes (Addendum)
Patient ID: [name], female   DOB: 1955-08-20, 58 y.o.   MRN: [id number]  Endocrinologist: Dr Loanne Drilling  Oncologist: Dr. Humphrey Rolls PCP: [name], NP [facility])  HPI Ms. Toya is a 58 year old female with history of breast cancer, obesity, HTN, DM, IBS, HLD, OSA, chemo induced cardiomyopathy s/p ICD and chronic systolic HF.    She had chemo induced cardiomyopathy.  Her EF has fallen from 45% per Cardiac MRI in July 2010 to 20-25% in 10/2012. S/P single chamber ICD (St Jude) implantation on 11/10/12 by Dr. Rayann Heman.     Myoview in 05/2010 EF 59%. No ischemia.   Echo 08/12/11: Left ventricle: mildly dilated, EF 20% to 25%. Diffuse hypokinesis. Doppler parameters are consistent with restrictive physiology, indicative of decreased left ventricular diastolic compliance and/or increased left atrial pressure. Left atrium was moderately dilated. Right atrium mildly dilated.  Admitted in June 2013 with cardiogenic shock. Required inotropes. Discharge weight 213 pounds  RHC on 01/30/12 showed decompensated HF with low cardiac output consistent with cardiogenic shock.  [place] 6/27  RA = 20  RV = 55/13/22  PA =56/34 (44)  PCW = 28  Fick cardiac output/index = 3.1/1.5  PVR = 5.3 Woods  FA sat = 91%  PA sat = 42%, 48%  SVC sat = 46%  04/09/12 ECHO EF 30-35% LV mildly dilated  Hyopkinesis 10/07/12 ECHO EF 20-25% RV ok. 03/31/14: ECHO EF 30-35%, severe HK inferior/inferoseptal walls, mild MR, LA mod dilated, RA mildly dilated  Follow up for Heart Failure: Last visit coreg increased to 12.5 mg q am and 18.75 mg qpm and digoxin cut back to 0.125 mg QOD, however patient has been taking 18.75 mg BID which she tolerated. Denies dizziness, CP, SOB, orthopnea or PND. Wearing CPAP at night. Minimal LE edema. Weight at home 222-225 lbs. In lots of pain in back and shoulders from lifting mother who now has bone cancer. Able to walk around the whole grocery store without stopping. Able to go upstairs.  Following a low salt diet and drinking less than 2L a day.   ROS: All systems negative except as listed in HPI, PMH and Problem List.  History   Social History  . Marital Status: Divorced    Spouse Name: N/A    Number of Children: N/A  . Years of Education: N/A   Occupational History  . Disabled    Social History Main Topics  . Smoking status: Former Smoker    Quit date: 04/12/2001  . Smokeless tobacco: Never Used  . Alcohol Use: No  . Drug Use: No  . Sexual Activity: Not Currently   Other Topics Concern  . Not on file   Social History Narrative   Lives with mother and brother Warden Fillers.   Regular exercise-no   Family History  Problem Relation Age of Onset  . Diabetes Mother   . Hypertension Other   . Coronary artery disease Father 13    Past Medical History  Diagnosis Date  . 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 DYSLIPIDEMIA 05/01/2009  . OBESITY 07/24/2007  . OBSTRUCTIVE SLEEP APNEA 07/24/2007    with CPAP compliance  . RESTLESS LEG SYNDROME 07/24/2007  . SYSTOLIC HEART FAILURE, CHRONIC 01/05/2009    a. chemo induced cardiomyopathy b. cMRI (02/2009) EF 45% c. Myoview 05/2010: EF 59%, no ischemia d. RHC (01/2012) RA 20, RV 55/13/22, PA 56/34 (44), PCWP 28, Fick CO/CI: 3.1 / 1.5, PVR 5.3 WU, PA sat 42% & 48% e. ECHO (10/2012) EF 20-25%,  diff HK, MV calcified annulus f. ECHO (03/2014) EF 30-35%, sev HK, inferior/inferoseptal walls, mild MR  . Palpitations 09/20/2010  . 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 Hypertension   . History of breast cancer 2006    with return in 2008, s/p mastectomy, XRT and chemotherapy  . Osteoarthritis   . Anxiety   . Depression     followed by a psychiatrist  . Asthma   . GERD (gastroesophageal reflux disease)   . Gastroparesis   . IBS (irritable bowel syndrome)   . Myocardial infarction   . ICD (implantable cardiac defibrillator) in place 11/10/2012    ICD (11/2012)  . COPD (chronic obstructive pulmonary disease)     Current Outpatient  Prescriptions  Medication Sig Dispense Refill  . ACCU-CHEK FASTCLIX LANCETS MISC       . ACCU-CHEK SMARTVIEW test strip       . acetaminophen (TYLENOL ARTHRITIS PAIN) 650 MG CR tablet Take 1,300 mg by mouth every 8 (eight) hours as needed. pain      . albuterol (PROVENTIL HFA) 108 (90 BASE) MCG/ACT inhaler Inhale 2 puffs into the lungs every 6 (six) hours as needed. For shortness of breath      . atorvastatin (LIPITOR) 40 MG tablet Take 40 mg by mouth daily.        . baclofen (LIORESAL) 10 MG tablet Take 10 mg by mouth 2 (two) times daily as needed.      . busPIRone (BUSPAR) 30 MG tablet Take 30 mg by mouth 2 (two) times daily.       . carvedilol (COREG) 12.5 MG tablet 18.75 mg 2 (two) times daily with a meal.      . citalopram (CELEXA) 40 MG tablet Take 40 mg by mouth daily.      . digoxin (LANOXIN) 0.125 MG tablet Take 1 tablet (125 mcg total) by mouth daily.  30 tablet  6  . furosemide (LASIX) 20 MG tablet Take 3 tablets (60 mg total) by mouth 2 (two) times daily.  180 tablet  1  . Glucosamine 500 MG CAPS Take 1 capsule by mouth daily.        . Insulin Lispro Prot & Lispro (75-25) 100 UNIT/ML SUPN Inject 180 Units into the skin 2 (two) times daily before a meal.       . Insulin Pen Needle (B-D ULTRAFINE III SHORT PEN) 31G X 8 MM MISC 18/day as directed dx 250.00  500 each  11  . levothyroxine (SYNTHROID, LEVOTHROID) 75 MCG tablet Take 75 mcg by mouth daily.        Marland Kitchen loperamide (IMODIUM A-D) 2 MG tablet Take 4 mg by mouth as needed for diarrhea or loose stools. diarrhea      . Melatonin 3 MG CAPS Take 0.5 capsules by mouth at bedtime.      . metolazone (ZAROXOLYN) 2.5 MG tablet Take 2.5 mg as needed for weight gain.  30 tablet  3  . mometasone (NASONEX) 50 MCG/ACT nasal spray Place 2 sprays into the nose daily. allergies      . nitroGLYCERIN (NITROSTAT) 0.4 MG SL tablet Place 1 tablet (0.4 mg total) under the tongue every 5 (five) minutes as needed. For chest pain  25 tablet  3  . NOVOFINE  30G X 8 MM MISC USE UP TO 18 TIMES DAILY AS DIRECTED.  200 each  0  . omega-3 acid ethyl esters (LOVAZA) 1 G capsule Take 2 g by mouth 2 (two) times daily.       Marland Kitchen  pantoprazole (PROTONIX) 40 MG tablet Take 40 mg by mouth 2 (two) times daily.        . potassium chloride SA (K-DUR,KLOR-CON) 20 MEQ tablet Take 60 mEq by mouth 2 (two) times daily.       . ramipril (ALTACE) 10 MG capsule Take 1 capsule (10 mg total) by mouth daily.  90 capsule  3  . rOPINIRole (REQUIP) 1 MG tablet Take 1 mg by mouth at bedtime.        Marland Kitchen spironolactone (ALDACTONE) 25 MG tablet Take 1 tablet (25 mg total) by mouth daily.  30 tablet  6  . SUMAtriptan (IMITREX) 25 MG tablet Take 25 mg by mouth every 2 (two) hours as needed. For migraine      . tiotropium (SPIRIVA) 18 MCG inhalation capsule Place 18 mcg into inhaler and inhale daily.        . traMADol (ULTRAM) 50 MG tablet Take 50 mg by mouth every 6 (six) hours as needed. pain       No current facility-administered medications for this encounter.   Filed Vitals:   03/31/14 0959  BP: 102/63  Pulse: 73  Resp: 18  Weight: 224 lb 6 oz (101.776 kg)  SpO2: 97%    PHYSICAL EXAM: General:  Obese, Well appearing. In wheelchair HEENT: normal except for poor dentition Neck: supple. JVP hard to see but dose not appear elevated, carotids 2+ bilaterally; no bruits. No lymphadenopathy or thryomegaly appreciated. Cor: PMI normal. Regular rate & rhythm. no S3. Lungs: clear Abdomen: soft, obese, nontender, no distention. No hepatosplenomegaly. No bruits or masses. Good bowel sounds. Extremities: no cyanosis, clubbing, rash, no edema Neuro: alert & orientedx3, cranial nerves grossly intact. Moves all 4 extremities w/o difficulty. Affect pleasant.  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 Will continue lasix 60 mg BID and discussed the use of sliding scale diuretics.    - Continue coreg 18.75 mg BID.  - Continue ramipril 10 mg daily, spiro 25 mg daily and digoxin 0.125 mg QOD.  - Reinforced the need and importance of daily weights, a low sodium diet, and fluid restriction (less than 2 L a day). Instructed to call the HF clinic if weight increases more than 3 lbs overnight or 5 lbs in a week.  2) HTN - Stable. Continue current medications.Marland Kitchen  3) OSA - Compliant with CPAP nightly. 4) HLD - Followed by PCP.  5) Obesity - Discussed with her trying to get back on her diet and to watch her portion sizes. Having difficulty with trying to be active d/t her mom actively dying.  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 Will continue statin, BB and ACE-I. No s/s of ischemia.  F/U 4 months  Junie Bame B NP-C 10:03 AM

## 2014-03-30 NOTE — Telephone Encounter (Signed)
Diabetic bundle, left message for patient to schedule follow up appt.

## 2014-03-31 ENCOUNTER — Ambulatory Visit (HOSPITAL_BASED_OUTPATIENT_CLINIC_OR_DEPARTMENT_OTHER)
Admission: RE | Admit: 2014-03-31 | Discharge: 2014-03-31 | Disposition: A | Discharge status: Home / Self Care | Payer: Medicare Other | Source: Ambulatory Visit | Attending: Cardiology | Admitting: Cardiology

## 2014-03-31 ENCOUNTER — Encounter (HOSPITAL_COMMUNITY): Payer: Self-pay

## 2014-03-31 ENCOUNTER — Ambulatory Visit (HOSPITAL_COMMUNITY)
Admission: RE | Admit: 2014-03-31 | Discharge: 2014-03-31 | Disposition: A | Discharge status: Home / Self Care | Payer: Medicare Other | Source: Ambulatory Visit | Attending: Internal Medicine | Admitting: Internal Medicine

## 2014-03-31 VITALS — BP 102/63 | HR 73 | Resp 18 | Wt 224.4 lb

## 2014-03-31 DIAGNOSIS — I379 Nonrheumatic pulmonary valve disorder, unspecified: Secondary | ICD-10-CM | POA: Diagnosis not present

## 2014-03-31 DIAGNOSIS — I517 Cardiomegaly: Secondary | ICD-10-CM | POA: Diagnosis not present

## 2014-03-31 DIAGNOSIS — E119 Type 2 diabetes mellitus without complications: Secondary | ICD-10-CM | POA: Diagnosis not present

## 2014-03-31 DIAGNOSIS — J449 Chronic obstructive pulmonary disease, unspecified: Secondary | ICD-10-CM | POA: Diagnosis not present

## 2014-03-31 DIAGNOSIS — I10 Essential (primary) hypertension: Secondary | ICD-10-CM

## 2014-03-31 DIAGNOSIS — E785 Hyperlipidemia, unspecified: Secondary | ICD-10-CM | POA: Diagnosis not present

## 2014-03-31 DIAGNOSIS — I2510 Atherosclerotic heart disease of native coronary artery without angina pectoris: Secondary | ICD-10-CM | POA: Insufficient documentation

## 2014-03-31 DIAGNOSIS — I079 Rheumatic tricuspid valve disease, unspecified: Secondary | ICD-10-CM | POA: Diagnosis not present

## 2014-03-31 DIAGNOSIS — I509 Heart failure, unspecified: Secondary | ICD-10-CM | POA: Diagnosis not present

## 2014-03-31 DIAGNOSIS — I5022 Chronic systolic (congestive) heart failure: Secondary | ICD-10-CM | POA: Diagnosis present

## 2014-03-31 DIAGNOSIS — I959 Hypotension, unspecified: Secondary | ICD-10-CM | POA: Insufficient documentation

## 2014-03-31 DIAGNOSIS — J4489 Other specified chronic obstructive pulmonary disease: Secondary | ICD-10-CM | POA: Insufficient documentation

## 2014-03-31 DIAGNOSIS — E669 Obesity, unspecified: Secondary | ICD-10-CM

## 2014-03-31 DIAGNOSIS — I059 Rheumatic mitral valve disease, unspecified: Secondary | ICD-10-CM | POA: Insufficient documentation

## 2014-03-31 DIAGNOSIS — G4733 Obstructive sleep apnea (adult) (pediatric): Secondary | ICD-10-CM | POA: Insufficient documentation

## 2014-03-31 LAB — BASIC METABOLIC PANEL
Anion gap: 14 (ref 5–15)
BUN: 17 mg/dL (ref 6–23)
CALCIUM: 10 mg/dL (ref 8.4–10.5)
CO2: 26 mEq/L (ref 19–32)
Chloride: 97 mEq/L (ref 96–112)
Creatinine, Ser: 0.8 mg/dL (ref 0.50–1.10)
GFR calc Af Amer: >90 mL/min (ref >90)
GFR, EST NON AFRICAN AMERICAN: 80 mL/min — AB (ref >90)
Glucose, Bld: 174 mg/dL — ABNORMAL HIGH (ref 70–99)
Potassium: 4.9 mEq/L (ref 3.7–5.3)
Sodium: 137 mEq/L (ref 137–147)

## 2014-03-31 MED ORDER — SPIRONOLACTONE 25 MG PO TABS: 25.0000 mg | ORAL_TABLET | Freq: Every day | ORAL | Status: DC

## 2014-03-31 MED ORDER — POTASSIUM CHLORIDE CRYS ER 20 MEQ PO TBCR: 60.0000 meq | EXTENDED_RELEASE_TABLET | Freq: Two times a day (BID) | ORAL | Status: DC

## 2014-03-31 MED ORDER — FUROSEMIDE 20 MG PO TABS: 60.0000 mg | ORAL_TABLET | Freq: Two times a day (BID) | ORAL | Status: DC

## 2014-03-31 MED ORDER — DIGOXIN 125 MCG PO TABS: 125.0000 ug | ORAL_TABLET | ORAL | Status: DC

## 2014-03-31 NOTE — Progress Notes (Signed)
  Echocardiogram 2D Echocardiogram has been performed.  [name] 03/31/2014, 9:59 AM

## 2014-03-31 NOTE — Patient Instructions (Signed)
Doing great.  Change your digoxin 0.125 mg every other day.  Call any issues.  We are praying for you and your mom.  Will call about your ECHO.  Follow up in 4 months  Do the following things EVERYDAY: 1) Weigh yourself in the morning before breakfast. Write it down and keep it in a log. 2) Take your medicines as prescribed 3) Eat low salt foods-Limit salt (sodium) to 2000 mg per day.  4) Stay as active as you can everyday 5) Limit all fluids for the day to less than 2 liters 6)

## 2014-04-01 ENCOUNTER — Telehealth (HOSPITAL_COMMUNITY): Payer: Self-pay | Admitting: Vascular Surgery

## 2014-04-01 ENCOUNTER — Telehealth (HOSPITAL_COMMUNITY): Payer: Self-pay | Admitting: Anesthesiology

## 2014-04-01 NOTE — Telephone Encounter (Signed)
Pt returning a call from yesterday evening... She believes this is a call a about her Echo.. Please advise

## 2014-04-01 NOTE — Telephone Encounter (Signed)
See phone note from Junie Bame, NP

## 2014-04-28 NOTE — Telephone Encounter (Signed)
Entered in error

## 2014-05-10 ENCOUNTER — Telehealth: Payer: Self-pay | Admitting: Hematology and Oncology

## 2014-05-10 NOTE — Telephone Encounter (Signed)
per VG to move appt from [facility] day-r/s appt-cld pt to adv of new time & date-left message

## 2014-05-10 NOTE — Telephone Encounter (Signed)
per VG to move appt from [facility] day-r/s appt-cld pt to adv of new time & date-left message

## 2014-05-12 ENCOUNTER — Ambulatory Visit (HOSPITAL_COMMUNITY)
Admission: RE | Admit: 2014-05-12 | Discharge: 2014-05-12 | Disposition: A | Discharge status: Home / Self Care | Payer: Medicare Other | Source: Ambulatory Visit | Attending: Internal Medicine | Admitting: Internal Medicine

## 2014-05-12 ENCOUNTER — Encounter (HOSPITAL_COMMUNITY): Payer: Self-pay

## 2014-05-12 ENCOUNTER — Telehealth (HOSPITAL_COMMUNITY): Payer: Self-pay | Admitting: Vascular Surgery

## 2014-05-12 VITALS — BP 111/80 | HR 97 | Resp 18 | Wt 219.1 lb

## 2014-05-12 DIAGNOSIS — I2510 Atherosclerotic heart disease of native coronary artery without angina pectoris: Secondary | ICD-10-CM | POA: Insufficient documentation

## 2014-05-12 DIAGNOSIS — K219 Gastro-esophageal reflux disease without esophagitis: Secondary | ICD-10-CM | POA: Insufficient documentation

## 2014-05-12 DIAGNOSIS — E669 Obesity, unspecified: Secondary | ICD-10-CM

## 2014-05-12 DIAGNOSIS — T451X5S Adverse effect of antineoplastic and immunosuppressive drugs, sequela: Secondary | ICD-10-CM | POA: Diagnosis not present

## 2014-05-12 DIAGNOSIS — E785 Hyperlipidemia, unspecified: Secondary | ICD-10-CM | POA: Insufficient documentation

## 2014-05-12 DIAGNOSIS — I10 Essential (primary) hypertension: Secondary | ICD-10-CM

## 2014-05-12 DIAGNOSIS — E039 Hypothyroidism, unspecified: Secondary | ICD-10-CM | POA: Diagnosis not present

## 2014-05-12 DIAGNOSIS — Z95810 Presence of automatic (implantable) cardiac defibrillator: Secondary | ICD-10-CM | POA: Insufficient documentation

## 2014-05-12 DIAGNOSIS — Z8249 Family history of ischemic heart disease and other diseases of the circulatory system: Secondary | ICD-10-CM | POA: Diagnosis not present

## 2014-05-12 DIAGNOSIS — G4733 Obstructive sleep apnea (adult) (pediatric): Secondary | ICD-10-CM | POA: Diagnosis not present

## 2014-05-12 DIAGNOSIS — I427 Cardiomyopathy due to drug and external agent: Secondary | ICD-10-CM | POA: Diagnosis not present

## 2014-05-12 DIAGNOSIS — I252 Old myocardial infarction: Secondary | ICD-10-CM | POA: Insufficient documentation

## 2014-05-12 DIAGNOSIS — Z79899 Other long term (current) drug therapy: Secondary | ICD-10-CM | POA: Insufficient documentation

## 2014-05-12 DIAGNOSIS — Z853 Personal history of malignant neoplasm of breast: Secondary | ICD-10-CM | POA: Diagnosis not present

## 2014-05-12 DIAGNOSIS — R002 Palpitations: Secondary | ICD-10-CM

## 2014-05-12 DIAGNOSIS — Z87891 Personal history of nicotine dependence: Secondary | ICD-10-CM | POA: Insufficient documentation

## 2014-05-12 DIAGNOSIS — J449 Chronic obstructive pulmonary disease, unspecified: Secondary | ICD-10-CM | POA: Insufficient documentation

## 2014-05-12 DIAGNOSIS — I5022 Chronic systolic (congestive) heart failure: Secondary | ICD-10-CM

## 2014-05-12 LAB — DIGOXIN LEVEL: Digoxin Level: 0.4 ng/mL — ABNORMAL LOW (ref 0.8–2.0)

## 2014-05-12 NOTE — Patient Instructions (Signed)
Continue current medications.  Follow up in 3 months  Do the following things EVERYDAY: 1) Weigh yourself in the morning before breakfast. Write it down and keep it in a log. 2) Take your medicines as prescribed 3) Eat low salt foods-Limit salt (sodium) to 2000 mg per day.  4) Stay as active as you can everyday 5) Limit all fluids for the day to less than 2 liters 6)

## 2014-05-12 NOTE — Telephone Encounter (Signed)
Pt called she feels like her heart is beating really fast, a little SOB, She also feels unbalanced she keeps walking in to walls.. She not sick but she not feeling 100%... Per pt.. Please advise

## 2014-05-12 NOTE — Progress Notes (Signed)
Patient ID: [name], female   DOB: 01/05/1956, 58 y.o.   MRN: [id number]  Endocrinologist: Dr Loanne Drilling  Oncologist: Dr. Humphrey Rolls PCP: Joanne Peace, NP [facility])  HPI [name] is a 58 year old female with history of breast cancer, obesity, HTN, DM, IBS, HLD, OSA, chemo induced cardiomyopathy s/p ICD and chronic systolic HF.    She had chemo induced cardiomyopathy.  Her EF has fallen from 45% per Cardiac MRI in July 2010 to 20-25% in 10/2012. S/P single chamber ICD (St Jude) implantation on 11/10/12 by Dr. Rayann Heman.     Myoview in 05/2010 EF 59%. No ischemia.   Echo 08/12/11: Left ventricle: mildly dilated, EF 20% to 25%. Diffuse hypokinesis. Doppler parameters are consistent with restrictive physiology, indicative of decreased left ventricular diastolic compliance and/or increased left atrial pressure. Left atrium was moderately dilated. Right atrium mildly dilated.  Admitted in June 2013 with cardiogenic shock. Required inotropes. Discharge weight 213 pounds  RHC on 01/30/12 showed decompensated HF with low cardiac output consistent with cardiogenic shock.  RHC 6/27  RA = 20  RV = 55/13/22  PA =56/34 (44)  PCW = 28  Fick cardiac output/index = 3.1/1.5  PVR = 5.3 Woods  FA sat = 91%  PA sat = 42%, 48%  SVC sat = 46%  04/09/12 ECHO EF 30-35% LV mildly dilated  Hyopkinesis 10/07/12 ECHO EF 20-25% RV ok. 03/31/14: ECHO EF 30-35%, severe HK inferior/inferoseptal walls, mild MR, LA mod dilated, RA mildly dilated  Acute visit: Called today reporting that she is dizzy, her heart is racing and feels unbalanced which started last night. Mother recently died. Minimal SOB with walking. Denies CP, orthopnea or PND. Wearing CPAP at night. Weight at home 218- 220 lbs. Not able to go around the whole grocery store without stopping. Following a low salt diet and drinking less than 2L a day.   ROS: All systems negative except as listed in HPI, PMH and Problem List.  History   Social  History  . Marital Status: Divorced    Spouse Name: N/A    Number of Children: N/A  . Years of Education: N/A   Occupational History  . Disabled    Social History Main Topics  . Smoking status: Former Smoker    Quit date: 04/12/2001  . Smokeless tobacco: Never Used  . Alcohol Use: No  . Drug Use: No  . Sexual Activity: Not Currently   Other Topics Concern  . Not on file   Social History Narrative   Lives with mother and brother [name]         Family History  Problem Relation Age of Onset  . Diabetes Mother     lung cancer, CHF, DM2  . Coronary artery disease Father 15    HTN, CABG x 3, DM2, deceased    Past Medical History  Diagnosis Date  . 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 DYSLIPIDEMIA 05/01/2009  . OBESITY 07/24/2007  . OBSTRUCTIVE SLEEP APNEA 07/24/2007    with CPAP compliance  . RESTLESS LEG SYNDROME 07/24/2007  . SYSTOLIC HEART FAILURE, CHRONIC 01/05/2009    a. chemo induced cardiomyopathy b. cMRI (02/2009) EF 45% c. Myoview 05/2010: EF 59%, no ischemia d. RHC (01/2012) RA 20, RV 55/13/22, PA 56/34 (44), PCWP 28, Fick CO/CI: 3.1 / 1.5, PVR 5.3 WU, PA sat 42% & 48% e. ECHO (10/2012) EF 20-25%, diff HK, MV calcified annulus f. ECHO (03/2014) EF 30-35%, sev HK, inferior/inferoseptal walls, mild MR  . Palpitations 09/20/2010  .  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 Hypertension   . History of breast cancer 2006    with return in 2008, s/p mastectomy, XRT and chemotherapy  . Osteoarthritis   . Anxiety   . Depression     followed by a psychiatrist  . Asthma   . GERD (gastroesophageal reflux disease)   . Gastroparesis   . IBS (irritable bowel syndrome)   . Myocardial infarction   . ICD (implantable cardiac defibrillator) in place 11/10/2012    ICD (11/2012)  . COPD (chronic obstructive pulmonary disease)     Current Outpatient Prescriptions  Medication Sig Dispense Refill  . ACCU-CHEK FASTCLIX LANCETS MISC       . ACCU-CHEK SMARTVIEW test strip       . acetaminophen  (TYLENOL ARTHRITIS PAIN) 650 MG CR tablet Take 1,300 mg by mouth every 8 (eight) hours as needed. pain      . albuterol (PROVENTIL HFA) 108 (90 BASE) MCG/ACT inhaler Inhale 2 puffs into the lungs every 6 (six) hours as needed. For shortness of breath      . atorvastatin (LIPITOR) 40 MG tablet Take 40 mg by mouth daily.        . baclofen (LIORESAL) 10 MG tablet Take 10 mg by mouth 2 (two) times daily as needed.      . busPIRone (BUSPAR) 30 MG tablet Take 30 mg by mouth 2 (two) times daily.       . carvedilol (COREG) 12.5 MG tablet 18.75 mg 2 (two) times daily with a meal.      . citalopram (CELEXA) 40 MG tablet Take 40 mg by mouth daily.      . digoxin (LANOXIN) 0.125 MG tablet Take 1 tablet (125 mcg total) by mouth every other day.  45 tablet  6  . furosemide (LASIX) 20 MG tablet Take 3 tablets (60 mg total) by mouth 2 (two) times daily.  540 tablet  6  . Glucosamine 500 MG CAPS Take 1 capsule by mouth daily.        . Insulin Lispro Prot & Lispro (75-25) 100 UNIT/ML SUPN Inject 180 Units into the skin 2 (two) times daily before a meal.       . Insulin Pen Needle (B-D ULTRAFINE III SHORT PEN) 31G X 8 MM MISC 18/day as directed dx 250.00  500 each  11  . levothyroxine (SYNTHROID, LEVOTHROID) 75 MCG tablet Take 75 mcg by mouth daily.        Marland Kitchen loperamide (IMODIUM A-D) 2 MG tablet Take 4 mg by mouth as needed for diarrhea or loose stools. diarrhea      . Melatonin 3 MG CAPS Take 0.5 capsules by mouth at bedtime.      . metolazone (ZAROXOLYN) 2.5 MG tablet Take 2.5 mg as needed for weight gain.  30 tablet  3  . mometasone (NASONEX) 50 MCG/ACT nasal spray Place 2 sprays into the nose daily. allergies      . nitroGLYCERIN (NITROSTAT) 0.4 MG SL tablet Place 1 tablet (0.4 mg total) under the tongue every 5 (five) minutes as needed. For chest pain  25 tablet  3  . NOVOFINE 30G X 8 MM MISC USE UP TO 18 TIMES DAILY AS DIRECTED.  200 each  0  . omega-3 acid ethyl esters (LOVAZA) 1 G capsule Take 2 g by mouth 2  (two) times daily.       . pantoprazole (PROTONIX) 40 MG tablet Take 40 mg by mouth 2 (two) times daily.        Marland Kitchen  potassium chloride SA (K-DUR,KLOR-CON) 20 MEQ tablet Take 3 tablets (60 mEq total) by mouth 2 (two) times daily.  540 tablet  3  . ramipril (ALTACE) 10 MG capsule Take 1 capsule (10 mg total) by mouth daily.  90 capsule  3  . rOPINIRole (REQUIP) 1 MG tablet Take 1 mg by mouth at bedtime.        Marland Kitchen spironolactone (ALDACTONE) 25 MG tablet Take 1 tablet (25 mg total) by mouth daily.  90 tablet  6  . SUMAtriptan (IMITREX) 25 MG tablet Take 25 mg by mouth every 2 (two) hours as needed. For migraine      . tiotropium (SPIRIVA) 18 MCG inhalation capsule Place 18 mcg into inhaler and inhale daily.        . traMADol (ULTRAM) 50 MG tablet Take 50 mg by mouth every 6 (six) hours as needed. pain       No current facility-administered medications for this encounter.   Filed Vitals:   05/12/14 1514  BP: 111/80  Pulse: 97  Resp: 18  Weight: 219 lb 2 oz (99.394 kg)  SpO2: 98%     PHYSICAL EXAM: General:  Obese, Well appearing. HEENT: normal except for poor dentition Neck: supple. JVP hard to see but dose not appear elevated, carotids 2+ bilaterally; no bruits. No lymphadenopathy or thryomegaly appreciated. Cor: PMI normal. Regular rate & rhythm. no S3. Lungs: clear Abdomen: soft, obese, nontender, no distention. No hepatosplenomegaly. No bruits or masses. Good bowel sounds. Extremities: no cyanosis, clubbing, rash, no edema Neuro: alert & orientedx3, cranial nerves grossly intact. Moves all 4 extremities w/o difficulty. Affect pleasant.  EKG: SR with PACs 84 bpm  ASSESSMENT & PLAN:  1) Chronic systolic HF, NICM thought to be chemo induced, EF 30-35% (03/2014) s/p ICD - NYHA III symptoms and volume status stable. Will continue lasix 60 mg BID and discussed the use of sliding scale diuretics. Check BMET and pro_BNP today.  - Continue coreg 18.75 mg BID.  - Continue ramipril 10 mg  daily, spiro 25 mg daily and digoxin 0.125 mg QOD.  - She is not orthostatic.  - Consider starting Ivabradine next visit.  - Reinforced the need and importance of daily weights, a low sodium diet, and fluid restriction (less than 2 L a day). Instructed to call the HF clinic if weight increases more than 3 lbs overnight or 5 lbs in a week.  2) HTN - Stable. Continue current medications.Marland Kitchen  3) OSA - Compliant with CPAP nightly. 4) HLD - Followed by PCP.  5) Obesity - Discussed with her trying to get back on her diet and to watch her portion sizes.  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 Will continue statin, BB and ACE-I. No s/s of ischemia.  F/U 3 months Junie Bame B NP-C 3:45 PM

## 2014-05-12 NOTE — Telephone Encounter (Signed)
Patient dizzy, fatigued, heart racing, sounds possibly dehydrated.  Also recently lost her mother.  Will have patient added on to afternoon schedule to further evaluate. [name]

## 2014-05-13 ENCOUNTER — Telehealth: Payer: Self-pay | Admitting: Hematology and Oncology

## 2014-05-13 LAB — BASIC METABOLIC PANEL
Anion gap: 13 (ref 5–15)
BUN: 24 mg/dL — AB (ref 6–23)
CALCIUM: 10.2 mg/dL (ref 8.4–10.5)
CO2: 27 mEq/L (ref 19–32)
CREATININE: 0.82 mg/dL (ref 0.50–1.10)
Chloride: 96 mEq/L (ref 96–112)
GFR calc Af Amer: 90 mL/min — ABNORMAL LOW (ref >90)
GFR calc non Af Amer: 77 mL/min — ABNORMAL LOW (ref >90)
Glucose, Bld: 254 mg/dL — ABNORMAL HIGH (ref 70–99)
Potassium: 4.8 mEq/L (ref 3.7–5.3)
Sodium: 136 mEq/L — ABNORMAL LOW (ref 137–147)

## 2014-05-13 LAB — PRO B NATRIURETIC PEPTIDE: Pro B Natriuretic peptide (BNP): 1019 pg/mL — ABNORMAL HIGH (ref 0–125)

## 2014-05-13 NOTE — Telephone Encounter (Signed)
, °

## 2014-05-17 ENCOUNTER — Ambulatory Visit: Payer: Medicare Other | Admitting: Hematology and Oncology

## 2014-05-18 ENCOUNTER — Ambulatory Visit: Payer: Medicare Other | Admitting: Hematology and Oncology

## 2014-05-18 ENCOUNTER — Other Ambulatory Visit: Payer: Medicare Other

## 2014-05-19 ENCOUNTER — Other Ambulatory Visit: Payer: Self-pay

## 2014-05-19 MED ORDER — RAMIPRIL 10 MG PO CAPS: 10.0000 mg | ORAL_CAPSULE | Freq: Every day | ORAL | Status: DC

## 2014-06-15 ENCOUNTER — Other Ambulatory Visit (HOSPITAL_COMMUNITY): Payer: Self-pay | Admitting: Cardiology

## 2014-06-15 DIAGNOSIS — I5022 Chronic systolic (congestive) heart failure: Secondary | ICD-10-CM

## 2014-06-15 MED ORDER — CARVEDILOL 12.5 MG PO TABS: 18.7500 mg | ORAL_TABLET | Freq: Two times a day (BID) | ORAL | Status: DC

## 2014-06-20 ENCOUNTER — Telehealth: Payer: Self-pay | Admitting: Cardiology

## 2014-06-20 ENCOUNTER — Ambulatory Visit (INDEPENDENT_AMBULATORY_CARE_PROVIDER_SITE_OTHER): Payer: Medicare Other | Admitting: *Deleted

## 2014-06-20 DIAGNOSIS — I471 Supraventricular tachycardia: Secondary | ICD-10-CM

## 2014-06-20 DIAGNOSIS — I5022 Chronic systolic (congestive) heart failure: Secondary | ICD-10-CM

## 2014-06-20 LAB — MDC_IDC_ENUM_SESS_TYPE_REMOTE
Battery Remaining Longevity: 86 mo
Brady Statistic RV Percent Paced: 1 %
Date Time Interrogation Session: 20151116072224
HighPow Impedance: 78 Ohm
HighPow Impedance: 78 Ohm
Lead Channel Pacing Threshold Amplitude: 0.5 V
Lead Channel Pacing Threshold Pulse Width: 0.5 ms
Lead Channel Sensing Intrinsic Amplitude: 12 mV
Lead Channel Setting Pacing Amplitude: 2.5 V
Lead Channel Setting Pacing Pulse Width: 0.5 ms
Lead Channel Setting Sensing Sensitivity: 0.5 mV
MDC IDC MSMT BATTERY REMAINING PERCENTAGE: 83 %
MDC IDC MSMT BATTERY VOLTAGE: 3.02 V
MDC IDC MSMT LEADCHNL RV IMPEDANCE VALUE: 430 Ohm
MDC IDC PG SERIAL: 7066421
Zone Setting Detection Interval: 270 ms
Zone Setting Detection Interval: 330 ms

## 2014-06-20 NOTE — Telephone Encounter (Signed)
I informed pt brother that I would call pt tomorrow with message.

## 2014-06-23 NOTE — Progress Notes (Signed)
Remote ICD transmission.   

## 2014-07-06 ENCOUNTER — Encounter: Payer: Self-pay | Admitting: Cardiology

## 2014-07-11 ENCOUNTER — Encounter: Payer: Self-pay | Admitting: Internal Medicine

## 2014-07-14 ENCOUNTER — Encounter (HOSPITAL_COMMUNITY): Payer: Self-pay | Admitting: Internal Medicine

## 2014-07-15 NOTE — Progress Notes (Signed)
Spoke with patient re: appointment.  Pt is ok with moving appointment from Thursday to Friday at same time.  POF sent

## 2014-07-20 ENCOUNTER — Telehealth (HOSPITAL_COMMUNITY): Payer: Self-pay | Admitting: Vascular Surgery

## 2014-07-20 NOTE — Telephone Encounter (Signed)
Pt called she went to the eye doctor today her doctor instructed her to call... Her bp 74/46... She states she feels fine.. Please advise

## 2014-07-21 ENCOUNTER — Ambulatory Visit: Payer: Medicare Other | Admitting: Hematology and Oncology

## 2014-07-22 ENCOUNTER — Ambulatory Visit (HOSPITAL_BASED_OUTPATIENT_CLINIC_OR_DEPARTMENT_OTHER): Payer: Medicare Other | Admitting: Hematology and Oncology

## 2014-07-22 VITALS — BP 96/44 | HR 92 | Temp 97.9°F | Resp 18 | Ht 60.0 in | Wt 227.6 lb

## 2014-07-22 DIAGNOSIS — C50511 Malignant neoplasm of lower-outer quadrant of right female breast: Secondary | ICD-10-CM | POA: Insufficient documentation

## 2014-07-22 DIAGNOSIS — Z853 Personal history of malignant neoplasm of breast: Secondary | ICD-10-CM

## 2014-07-22 NOTE — Telephone Encounter (Signed)
LATE ENTRY:  Left mess for pt on home # 12/16 pm if asymptomatic continue to monitor and c/b if continues to be low, BP usually runs in the 90s

## 2014-07-22 NOTE — Addendum Note (Signed)
Addended by: Prentiss Bells on: 07/22/2014 08:26 PM   Modules accepted: Medications

## 2014-07-22 NOTE — Assessment & Plan Note (Addendum)
Right breast cancer: Originally diagnosed November 2005 treated with lumpectomy, adjuvant chemotherapy and radiation. Initial staging T1 cN0 M0 stage IA: Subsequently she relapsed in the same breast 08/12/2006 and underwent right breast mastectomy on 08/27/2006 triple negative breast cancer did not get adjuvant therapy because of her diabetes and heart issues. Currently on observation  Surveillance: 1. Today's breast exam is normal 2. Left breast mammogram December 2015  Boils under the left breast: Patient just started amoxicillin for a tooth problem. If it does not get better, we will call in for Augmentin. Patient will call us in a week and let us know.  Survivorship: Discussed the importance of physical exercise in decreasing the likelihood of breast cancer recurrence. Recommended 30 mins daily 6 days a week of either brisk walking or cycling or swimming. Encouraged patient to eat more fruits and vegetables and decrease red meat.   Return to clinic in 1 year for follow-up in the survivorship clinic

## 2014-07-22 NOTE — Progress Notes (Signed)
Patient Care Team: Nicoletta Dress, MD as PCP - General (Internal Medicine)  DIAGNOSIS: No matching staging information was found for the patient.  SUMMARY OF ONCOLOGIC HISTORY:   Breast cancer of lower-outer quadrant of right female breast   06/13/2004 Surgery Right breast lumpectomy: Invasive ductal carcinoma 1.7 cm grade 3 with high-grade DCIS, 3 lymph nodes negative   07/16/2004 - 10/15/2004 Chemotherapy Adjuvant chemotherapy with FEC   08/12/2006 Relapse/Recurrence Relapsed disease in the right breast   08/27/2006 Surgery Right breast mastectomy: Tumor size 0.4 cm margins negative, ER 0%, PR 0%, HER-2 negative, Ki-67 13%    CHIEF COMPLIANT: Follow-up of relapsed breast cancer  INTERVAL HISTORY: [name] is a 58 year old lady with above-mentioned history of right-sided breast cancer initially treated with lumpectomy followed by radiation and adjuvant chemotherapy. She relapsed in 2008 and was treated with mastectomy. She could not get adjuvant chemotherapy because of diabetes and her heart issues. She's been followed routinely annually for follow-ups. She had a mammogram in December 2014 which showed a palpable abnormality that was biopsy-proven to be benign. She currently has boils under the left breast mainly because she sweats a lot and does not keep that area dry.  REVIEW OF SYSTEMS:   Constitutional: Denies fevers, chills or abnormal weight loss Eyes: Denies blurriness of vision Ears, nose, mouth, throat, and face: Denies mucositis or sore throat Respiratory: Denies cough, dyspnea or wheezes Cardiovascular: Denies palpitation, chest discomfort or lower extremity swelling Gastrointestinal:  Denies nausea, heartburn or change in bowel habits Skin: Denies abnormal skin rashes Lymphatics: Denies new lymphadenopathy or easy bruising Neurological:Denies numbness, tingling or new weaknesses Behavioral/Psych: Mood is stable, no new changes  Breast: Boils under the left breast All  other systems were reviewed with the patient and are negative.  I have reviewed the past medical history, past surgical history, social history and family history with the patient and they are unchanged from previous note.  ALLERGIES:  is allergic to bee venom; aspirin; ciprofloxacin; codeine; naproxen; sulfonamide derivatives; and tape.  MEDICATIONS:  Current Outpatient Prescriptions  Medication Sig Dispense Refill  . ACCU-CHEK FASTCLIX LANCETS MISC     . ACCU-CHEK SMARTVIEW test strip     . acetaminophen (TYLENOL ARTHRITIS PAIN) 650 MG CR tablet Take 1,300 mg by mouth every 8 (eight) hours as needed. pain    . albuterol (PROVENTIL HFA) 108 (90 BASE) MCG/ACT inhaler Inhale 2 puffs into the lungs every 6 (six) hours as needed. For shortness of breath    . atorvastatin (LIPITOR) 40 MG tablet Take 40 mg by mouth daily.      . baclofen (LIORESAL) 10 MG tablet Take 10 mg by mouth 2 (two) times daily as needed.    . busPIRone (BUSPAR) 30 MG tablet Take 30 mg by mouth 2 (two) times daily.     . carvedilol (COREG) 12.5 MG tablet Take 1.5 tablets (18.75 mg total) by mouth 2 (two) times daily with a meal. 90 tablet 3  . citalopram (CELEXA) 40 MG tablet Take 40 mg by mouth daily.    . digoxin (LANOXIN) 0.125 MG tablet Take 1 tablet (125 mcg total) by mouth every other day. 45 tablet 6  . furosemide (LASIX) 20 MG tablet Take 3 tablets (60 mg total) by mouth 2 (two) times daily. 540 tablet 6  . Glucosamine 500 MG CAPS Take 1 capsule by mouth daily.      . Insulin Lispro Prot & Lispro (75-25) 100 UNIT/ML SUPN Inject 180 Units into the  skin 2 (two) times daily before a meal.     . Insulin Pen Needle (B-D ULTRAFINE III SHORT PEN) 31G X 8 MM MISC 18/day as directed dx 250.00 500 each 11  . levothyroxine (SYNTHROID, LEVOTHROID) 75 MCG tablet Take 75 mcg by mouth daily.      Marland Kitchen loperamide (IMODIUM A-D) 2 MG tablet Take 4 mg by mouth as needed for diarrhea or loose stools. diarrhea    . Melatonin 3 MG CAPS Take  0.5 capsules by mouth at bedtime.    . metolazone (ZAROXOLYN) 2.5 MG tablet Take 2.5 mg as needed for weight gain. 30 tablet 3  . mometasone (NASONEX) 50 MCG/ACT nasal spray Place 2 sprays into the nose daily. allergies    . nitroGLYCERIN (NITROSTAT) 0.4 MG SL tablet Place 1 tablet (0.4 mg total) under the tongue every 5 (five) minutes as needed. For chest pain 25 tablet 3  . NOVOFINE 30G X 8 MM MISC USE UP TO 18 TIMES DAILY AS DIRECTED. 200 each 0  . omega-3 acid ethyl esters (LOVAZA) 1 G capsule Take 2 g by mouth 2 (two) times daily.     . pantoprazole (PROTONIX) 40 MG tablet Take 40 mg by mouth 2 (two) times daily.      . potassium chloride SA (K-DUR,KLOR-CON) 20 MEQ tablet Take 3 tablets (60 mEq total) by mouth 2 (two) times daily. 540 tablet 3  . ramipril (ALTACE) 10 MG capsule Take 1 capsule (10 mg total) by mouth daily. 90 capsule 3  . rOPINIRole (REQUIP) 1 MG tablet Take 1 mg by mouth at bedtime.      Marland Kitchen spironolactone (ALDACTONE) 25 MG tablet Take 1 tablet (25 mg total) by mouth daily. 90 tablet 6  . SUMAtriptan (IMITREX) 25 MG tablet Take 25 mg by mouth every 2 (two) hours as needed. For migraine    . tiotropium (SPIRIVA) 18 MCG inhalation capsule Place 18 mcg into inhaler and inhale daily.      . traMADol (ULTRAM) 50 MG tablet Take 50 mg by mouth every 6 (six) hours as needed. pain     No current facility-administered medications for this visit.    PHYSICAL EXAMINATION: ECOG PERFORMANCE STATUS: 1 - Symptomatic but completely ambulatory  Filed Vitals:   07/22/14 1402  BP: 96/44  Pulse: 92  Temp: 97.9 F (36.6 C)  Resp: 18   Filed Weights   07/22/14 1402  Weight: 227 lb 9.6 oz (103.239 kg)    GENERAL:alert, no distress and comfortable SKIN: skin color, texture, turgor are normal, no rashes or significant lesions EYES: normal, Conjunctiva are pink and non-injected, sclera clear OROPHARYNX:no exudate, no erythema and lips, buccal mucosa, and tongue normal  NECK: supple,  thyroid normal size, non-tender, without nodularity LYMPH:  no palpable lymphadenopathy in the cervical, axillary or inguinal LUNGS: clear to auscultation and percussion with normal breathing effort HEART: regular rate & rhythm and no murmurs and no lower extremity edema ABDOMEN:abdomen soft, non-tender and normal bowel sounds Musculoskeletal:no cyanosis of digits and no clubbing  NEURO: alert & oriented x 3 with fluent speech, no focal motor/sensory deficits BREAST: Boils under the left breast. No palpable axillary supraclavicular or infraclavicular adenopathy no breast tenderness or nipple discharge.   LABORATORY DATA:  I have reviewed the data as listed   Chemistry      Component Value Date/Time   NA 136* 05/12/2014 1554   K 4.8 05/12/2014 1554   CL 96 05/12/2014 1554   CO2 27 05/12/2014 1554  BUN 24* 05/12/2014 1554   CREATININE 0.82 05/12/2014 1554      Component Value Date/Time   CALCIUM 10.2 05/12/2014 1554   ALKPHOS 83 02/18/2012 1558   AST 26 02/18/2012 1558   ALT 29 02/18/2012 1558   BILITOT 0.9 02/18/2012 1558       Lab Results  Component Value Date   WBC 9.8 11/10/2012   HGB 12.5 11/10/2012   HCT 36.3 11/10/2012   MCV 84.0 11/10/2012   PLT 298 11/10/2012   NEUTROABS 7.1* 02/18/2012     RADIOGRAPHIC STUDIES: I have personally reviewed the radiology reports and agreed with their findings. Mammogram December 2015: The report is being obtained.  ASSESSMENT & PLAN:  Breast cancer of lower-outer quadrant of right female breast Right breast cancer: Originally diagnosed November 2005 treated with lumpectomy, adjuvant chemotherapy and radiation. Initial staging T1 cN0 M0 stage IA: Subsequently she relapsed in the same breast 08/12/2006 and underwent right breast mastectomy on 08/27/2006 triple negative breast cancer did not get adjuvant therapy because of her diabetes and heart issues. Currently on observation  Surveillance: 1. Today's breast exam is normal 2.  Left breast mammogram December 2015  Boils under the left breast: Patient just started amoxicillin for a tooth problem. If it does not get better, we will call in for Augmentin. Patient will call us in a week and let us know.  Survivorship: Discussed the importance of physical exercise in decreasing the likelihood of breast cancer recurrence. Recommended 30 mins daily 6 days a week of either brisk walking or cycling or swimming. Encouraged patient to eat more fruits and vegetables and decrease red meat.   Return to clinic in 1 year for follow-up in the survivorship clinic    No orders of the defined types were placed in this encounter.   The patient has a good understanding of the overall plan. she agrees with it. She will call with any problems that may develop before her next visit here.   Rulon Eisenmenger, MD 07/22/2014 2:22 PM

## 2014-07-23 ENCOUNTER — Other Ambulatory Visit (HOSPITAL_COMMUNITY): Payer: Self-pay | Admitting: Cardiology

## 2014-07-23 DIAGNOSIS — I5022 Chronic systolic (congestive) heart failure: Secondary | ICD-10-CM

## 2014-07-23 MED ORDER — METOLAZONE 2.5 MG PO TABS: ORAL_TABLET | ORAL | Status: DC

## 2014-07-28 ENCOUNTER — Encounter: Payer: Self-pay | Admitting: *Deleted

## 2014-07-28 NOTE — Progress Notes (Signed)
Received mammogram report from [facility]. Sent to scan.

## 2014-08-01 ENCOUNTER — Telehealth: Payer: Self-pay

## 2014-08-01 ENCOUNTER — Encounter (HOSPITAL_COMMUNITY): Payer: Medicare Other

## 2014-08-01 NOTE — Telephone Encounter (Signed)
Pt out of town until [place].  Will call back then.

## 2014-08-03 ENCOUNTER — Telehealth: Payer: Self-pay

## 2014-08-03 NOTE — Telephone Encounter (Signed)
[facility] requested addnl views for 12/15 mammo -could not get hold of pt.  [facility] sent 12/21.  Pt reports addnl views done on 12/22 - [facility] reports everything fine.  Will contact [facility] for report from 12/22.

## 2014-08-12 ENCOUNTER — Encounter: Payer: Self-pay | Admitting: *Deleted

## 2014-08-12 NOTE — Progress Notes (Signed)
Received mammogram report from [facility]. Sent to scan.

## 2014-08-15 DIAGNOSIS — H40033 Anatomical narrow angle, bilateral: Secondary | ICD-10-CM | POA: Diagnosis not present

## 2014-08-15 DIAGNOSIS — H25043 Posterior subcapsular polar age-related cataract, bilateral: Secondary | ICD-10-CM | POA: Diagnosis not present

## 2014-08-29 ENCOUNTER — Encounter: Payer: Self-pay | Admitting: Hematology and Oncology

## 2014-08-29 DIAGNOSIS — Z86010 Personal history of colonic polyps: Secondary | ICD-10-CM | POA: Diagnosis not present

## 2014-08-29 DIAGNOSIS — K6389 Other specified diseases of intestine: Secondary | ICD-10-CM | POA: Diagnosis not present

## 2014-08-29 DIAGNOSIS — Z1211 Encounter for screening for malignant neoplasm of colon: Secondary | ICD-10-CM | POA: Diagnosis not present

## 2014-08-29 DIAGNOSIS — K5730 Diverticulosis of large intestine without perforation or abscess without bleeding: Secondary | ICD-10-CM | POA: Diagnosis not present

## 2014-08-29 DIAGNOSIS — K635 Polyp of colon: Secondary | ICD-10-CM | POA: Diagnosis not present

## 2014-08-29 DIAGNOSIS — D127 Benign neoplasm of rectosigmoid junction: Secondary | ICD-10-CM | POA: Diagnosis not present

## 2014-08-30 DIAGNOSIS — Z23 Encounter for immunization: Secondary | ICD-10-CM | POA: Diagnosis not present

## 2014-08-30 DIAGNOSIS — J449 Chronic obstructive pulmonary disease, unspecified: Secondary | ICD-10-CM | POA: Diagnosis not present

## 2014-08-30 DIAGNOSIS — E039 Hypothyroidism, unspecified: Secondary | ICD-10-CM | POA: Diagnosis not present

## 2014-08-30 DIAGNOSIS — E785 Hyperlipidemia, unspecified: Secondary | ICD-10-CM | POA: Diagnosis not present

## 2014-08-30 DIAGNOSIS — I10 Essential (primary) hypertension: Secondary | ICD-10-CM | POA: Diagnosis not present

## 2014-09-05 DIAGNOSIS — I4819 Other persistent atrial fibrillation: Secondary | ICD-10-CM

## 2014-09-05 HISTORY — DX: Other persistent atrial fibrillation: I48.19

## 2014-09-08 DIAGNOSIS — H268 Other specified cataract: Secondary | ICD-10-CM | POA: Diagnosis not present

## 2014-09-08 DIAGNOSIS — H2511 Age-related nuclear cataract, right eye: Secondary | ICD-10-CM | POA: Diagnosis not present

## 2014-09-10 ENCOUNTER — Emergency Department: Payer: Self-pay | Admitting: Emergency Medicine

## 2014-09-10 DIAGNOSIS — Z79899 Other long term (current) drug therapy: Secondary | ICD-10-CM | POA: Diagnosis not present

## 2014-09-10 DIAGNOSIS — Z7951 Long term (current) use of inhaled steroids: Secondary | ICD-10-CM | POA: Diagnosis not present

## 2014-09-10 DIAGNOSIS — E1165 Type 2 diabetes mellitus with hyperglycemia: Secondary | ICD-10-CM | POA: Diagnosis not present

## 2014-09-10 DIAGNOSIS — E11649 Type 2 diabetes mellitus with hypoglycemia without coma: Secondary | ICD-10-CM | POA: Diagnosis not present

## 2014-09-10 DIAGNOSIS — M5032 Other cervical disc degeneration, mid-cervical region: Secondary | ICD-10-CM | POA: Diagnosis not present

## 2014-09-10 DIAGNOSIS — S0101XA Laceration without foreign body of scalp, initial encounter: Secondary | ICD-10-CM | POA: Diagnosis not present

## 2014-09-10 DIAGNOSIS — S066X9A Traumatic subarachnoid hemorrhage with loss of consciousness of unspecified duration, initial encounter: Secondary | ICD-10-CM | POA: Diagnosis not present

## 2014-09-10 DIAGNOSIS — S199XXA Unspecified injury of neck, initial encounter: Secondary | ICD-10-CM | POA: Diagnosis not present

## 2014-09-10 DIAGNOSIS — Z79891 Long term (current) use of opiate analgesic: Secondary | ICD-10-CM | POA: Diagnosis not present

## 2014-09-10 DIAGNOSIS — I609 Nontraumatic subarachnoid hemorrhage, unspecified: Secondary | ICD-10-CM | POA: Diagnosis not present

## 2014-09-10 DIAGNOSIS — Z794 Long term (current) use of insulin: Secondary | ICD-10-CM | POA: Diagnosis not present

## 2014-09-10 LAB — COMPREHENSIVE METABOLIC PANEL
ALBUMIN: 3 g/dL — AB (ref 3.4–5.0)
Alkaline Phosphatase: 61 U/L (ref 46–116)
Anion Gap: 10 (ref 7–16)
BUN: 25 mg/dL — AB (ref 7–18)
Bilirubin,Total: 0.5 mg/dL (ref 0.2–1.0)
Calcium, Total: 8.2 mg/dL — ABNORMAL LOW (ref 8.5–10.1)
Chloride: 104 mmol/L (ref 98–107)
Co2: 23 mmol/L (ref 21–32)
Creatinine: 1.15 mg/dL (ref 0.60–1.30)
EGFR (African American): >60
EGFR (Non-African Amer.): 51 — ABNORMAL LOW
GLUCOSE: 117 mg/dL — AB (ref 65–99)
OSMOLALITY: 279 (ref 275–301)
Potassium: 3.6 mmol/L (ref 3.5–5.1)
SGOT(AST): 18 U/L (ref 15–37)
SGPT (ALT): 21 U/L (ref 14–63)
SODIUM: 137 mmol/L (ref 136–145)
TOTAL PROTEIN: 7.3 g/dL (ref 6.4–8.2)

## 2014-09-10 LAB — CBC
HCT: 35.8 % (ref 35.0–47.0)
HGB: 11.6 g/dL — AB (ref 12.0–16.0)
MCH: 28.1 pg (ref 26.0–34.0)
MCHC: 32.3 g/dL (ref 32.0–36.0)
MCV: 87 fL (ref 80–100)
Platelet: 303 10*3/uL (ref 150–440)
RBC: 4.12 10*6/uL (ref 3.80–5.20)
RDW: 16.9 % — ABNORMAL HIGH (ref 11.5–14.5)
WBC: 11.2 10*3/uL — ABNORMAL HIGH (ref 3.6–11.0)

## 2014-09-10 LAB — URINALYSIS, COMPLETE
BACTERIA: NONE SEEN
BILIRUBIN, UR: NEGATIVE
Blood: NEGATIVE
Glucose,UR: NEGATIVE mg/dL (ref 0–75)
Hyaline Cast: 7
Ketone: NEGATIVE
Leukocyte Esterase: NEGATIVE
NITRITE: NEGATIVE
Ph: 5 (ref 4.5–8.0)
Protein: NEGATIVE
RBC,UR: <1 /HPF (ref 0–5)
Specific Gravity: 1.011 (ref 1.003–1.030)
Squamous Epithelial: 1

## 2014-09-10 LAB — TROPONIN I: Troponin-I: <0.02 ng/mL

## 2014-09-12 DIAGNOSIS — E109 Type 1 diabetes mellitus without complications: Secondary | ICD-10-CM | POA: Diagnosis not present

## 2014-09-16 DIAGNOSIS — S0101XA Laceration without foreign body of scalp, initial encounter: Secondary | ICD-10-CM | POA: Diagnosis not present

## 2014-09-16 DIAGNOSIS — Z6836 Body mass index (BMI) 36.0-36.9, adult: Secondary | ICD-10-CM | POA: Diagnosis not present

## 2014-09-16 DIAGNOSIS — E15 Nondiabetic hypoglycemic coma: Secondary | ICD-10-CM | POA: Diagnosis not present

## 2014-09-21 ENCOUNTER — Telehealth: Payer: Self-pay | Admitting: Cardiology

## 2014-09-21 ENCOUNTER — Encounter: Payer: Medicare Other | Admitting: *Deleted

## 2014-09-21 NOTE — Telephone Encounter (Signed)
Attempted to confirm remote transmission with pt. No answer and was unable to leave a message.   

## 2014-09-22 ENCOUNTER — Encounter: Payer: Self-pay | Admitting: Cardiology

## 2014-10-07 DIAGNOSIS — Z6837 Body mass index (BMI) 37.0-37.9, adult: Secondary | ICD-10-CM | POA: Diagnosis not present

## 2014-10-07 DIAGNOSIS — E1165 Type 2 diabetes mellitus with hyperglycemia: Secondary | ICD-10-CM | POA: Diagnosis not present

## 2014-10-07 DIAGNOSIS — I10 Essential (primary) hypertension: Secondary | ICD-10-CM | POA: Diagnosis not present

## 2014-10-26 ENCOUNTER — Telehealth: Payer: Self-pay | Admitting: Internal Medicine

## 2014-10-26 NOTE — Telephone Encounter (Signed)
New Msg         Pt calling, states she can't complete any more transmissions until she gets a land line.   Please return call.

## 2014-10-26 NOTE — Telephone Encounter (Signed)
Attempted to return pt call. No answer and unable to leave a message.  

## 2014-10-27 NOTE — Telephone Encounter (Signed)
Pt will send remote transmission from a friends home. I will have [employer] rep send her a cellular adapter.

## 2014-11-02 DIAGNOSIS — L02213 Cutaneous abscess of chest wall: Secondary | ICD-10-CM | POA: Diagnosis not present

## 2014-11-02 DIAGNOSIS — E1165 Type 2 diabetes mellitus with hyperglycemia: Secondary | ICD-10-CM | POA: Diagnosis not present

## 2014-11-02 DIAGNOSIS — Z6837 Body mass index (BMI) 37.0-37.9, adult: Secondary | ICD-10-CM | POA: Diagnosis not present

## 2014-11-02 DIAGNOSIS — I10 Essential (primary) hypertension: Secondary | ICD-10-CM | POA: Diagnosis not present

## 2014-12-01 DIAGNOSIS — J449 Chronic obstructive pulmonary disease, unspecified: Secondary | ICD-10-CM | POA: Diagnosis not present

## 2014-12-01 DIAGNOSIS — E1165 Type 2 diabetes mellitus with hyperglycemia: Secondary | ICD-10-CM | POA: Diagnosis not present

## 2014-12-01 DIAGNOSIS — E559 Vitamin D deficiency, unspecified: Secondary | ICD-10-CM | POA: Diagnosis not present

## 2014-12-01 DIAGNOSIS — L02213 Cutaneous abscess of chest wall: Secondary | ICD-10-CM | POA: Diagnosis not present

## 2014-12-01 DIAGNOSIS — G43909 Migraine, unspecified, not intractable, without status migrainosus: Secondary | ICD-10-CM | POA: Diagnosis not present

## 2014-12-01 DIAGNOSIS — E785 Hyperlipidemia, unspecified: Secondary | ICD-10-CM | POA: Diagnosis not present

## 2014-12-01 DIAGNOSIS — I10 Essential (primary) hypertension: Secondary | ICD-10-CM | POA: Diagnosis not present

## 2014-12-25 ENCOUNTER — Emergency Department (HOSPITAL_COMMUNITY): Payer: Medicare Other

## 2014-12-25 ENCOUNTER — Emergency Department (HOSPITAL_COMMUNITY)
Admission: EM | Admit: 2014-12-25 | Discharge: 2014-12-25 | Disposition: A | Discharge status: Home / Self Care | Payer: Medicare Other | Attending: Emergency Medicine | Admitting: Emergency Medicine

## 2014-12-25 ENCOUNTER — Encounter (HOSPITAL_COMMUNITY): Payer: Self-pay | Admitting: Emergency Medicine

## 2014-12-25 DIAGNOSIS — E669 Obesity, unspecified: Secondary | ICD-10-CM | POA: Insufficient documentation

## 2014-12-25 DIAGNOSIS — G2581 Restless legs syndrome: Secondary | ICD-10-CM | POA: Diagnosis not present

## 2014-12-25 DIAGNOSIS — Z95810 Presence of automatic (implantable) cardiac defibrillator: Secondary | ICD-10-CM | POA: Diagnosis not present

## 2014-12-25 DIAGNOSIS — Z79899 Other long term (current) drug therapy: Secondary | ICD-10-CM | POA: Diagnosis not present

## 2014-12-25 DIAGNOSIS — I5022 Chronic systolic (congestive) heart failure: Secondary | ICD-10-CM | POA: Diagnosis not present

## 2014-12-25 DIAGNOSIS — R079 Chest pain, unspecified: Secondary | ICD-10-CM | POA: Insufficient documentation

## 2014-12-25 DIAGNOSIS — M1990 Unspecified osteoarthritis, unspecified site: Secondary | ICD-10-CM | POA: Diagnosis not present

## 2014-12-25 DIAGNOSIS — E119 Type 2 diabetes mellitus without complications: Secondary | ICD-10-CM | POA: Diagnosis not present

## 2014-12-25 DIAGNOSIS — R110 Nausea: Secondary | ICD-10-CM | POA: Diagnosis not present

## 2014-12-25 DIAGNOSIS — G4730 Sleep apnea, unspecified: Secondary | ICD-10-CM | POA: Diagnosis not present

## 2014-12-25 DIAGNOSIS — K219 Gastro-esophageal reflux disease without esophagitis: Secondary | ICD-10-CM | POA: Diagnosis not present

## 2014-12-25 DIAGNOSIS — I519 Heart disease, unspecified: Secondary | ICD-10-CM | POA: Diagnosis not present

## 2014-12-25 DIAGNOSIS — R0602 Shortness of breath: Secondary | ICD-10-CM | POA: Diagnosis not present

## 2014-12-25 DIAGNOSIS — Z7951 Long term (current) use of inhaled steroids: Secondary | ICD-10-CM | POA: Insufficient documentation

## 2014-12-25 DIAGNOSIS — I10 Essential (primary) hypertension: Secondary | ICD-10-CM

## 2014-12-25 DIAGNOSIS — E785 Hyperlipidemia, unspecified: Secondary | ICD-10-CM | POA: Insufficient documentation

## 2014-12-25 DIAGNOSIS — I252 Old myocardial infarction: Secondary | ICD-10-CM | POA: Diagnosis not present

## 2014-12-25 DIAGNOSIS — Z794 Long term (current) use of insulin: Secondary | ICD-10-CM | POA: Diagnosis not present

## 2014-12-25 DIAGNOSIS — R072 Precordial pain: Secondary | ICD-10-CM

## 2014-12-25 DIAGNOSIS — J441 Chronic obstructive pulmonary disease with (acute) exacerbation: Secondary | ICD-10-CM | POA: Diagnosis not present

## 2014-12-25 DIAGNOSIS — E032 Hypothyroidism due to medicaments and other exogenous substances: Secondary | ICD-10-CM | POA: Insufficient documentation

## 2014-12-25 DIAGNOSIS — Z853 Personal history of malignant neoplasm of breast: Secondary | ICD-10-CM | POA: Insufficient documentation

## 2014-12-25 DIAGNOSIS — Z87891 Personal history of nicotine dependence: Secondary | ICD-10-CM | POA: Diagnosis not present

## 2014-12-25 LAB — BASIC METABOLIC PANEL
Anion gap: 8 (ref 5–15)
BUN: 17 mg/dL (ref 6–20)
CO2: 28 mmol/L (ref 22–32)
CREATININE: 0.82 mg/dL (ref 0.44–1.00)
Calcium: 9.9 mg/dL (ref 8.9–10.3)
Chloride: 100 mmol/L — ABNORMAL LOW (ref 101–111)
GFR calc Af Amer: >60 mL/min (ref >60)
GFR calc non Af Amer: >60 mL/min (ref >60)
Glucose, Bld: 179 mg/dL — ABNORMAL HIGH (ref 65–99)
Potassium: 3.7 mmol/L (ref 3.5–5.1)
Sodium: 136 mmol/L (ref 135–145)

## 2014-12-25 LAB — CBC
HCT: 38.7 % (ref 36.0–46.0)
HEMOGLOBIN: 12.7 g/dL (ref 12.0–15.0)
MCH: 28.2 pg (ref 26.0–34.0)
MCHC: 32.8 g/dL (ref 30.0–36.0)
MCV: 86 fL (ref 78.0–100.0)
Platelets: 249 10*3/uL (ref 150–400)
RBC: 4.5 MIL/uL (ref 3.87–5.11)
RDW: 16.1 % — AB (ref 11.5–15.5)
WBC: 7.6 10*3/uL (ref 4.0–10.5)

## 2014-12-25 LAB — I-STAT TROPONIN, ED: Troponin i, poc: 0 ng/mL (ref 0.00–0.08)

## 2014-12-25 LAB — DIGOXIN LEVEL

## 2014-12-25 LAB — BRAIN NATRIURETIC PEPTIDE: B NATRIURETIC PEPTIDE 5: 180.5 pg/mL — AB (ref 0.0–100.0)

## 2014-12-25 NOTE — ED Provider Notes (Addendum)
CSN: [id number]     Arrival date & time 12/25/14  [id number] History   First MD Initiated Contact with Patient 12/25/14 [phone]     Chief Complaint  Patient presents with  . Chest Pain     (Consider location/radiation/quality/duration/timing/severity/associated sxs/prior Treatment) HPI Comments: Patient with a history of systolic heart failure with ICD placement, hyperlipidemia, hypertension and COPD and diabetes mellitus presents with chest pain. She states she's been having intermittent pain to her chest for the last 3 days. It's been a pressure feeling to the left side of her chest. It's otherwise nonradiating. It has been lasting about 30-45 minutes but today it lasted longer about 2 hours. It's nonexertional. She has some associated shortness of breath and nausea. She has no diaphoresis. No cough or chest congestion. No fevers. No leg swelling or weight gain although she hasn't been able to weigh herself at home due to the fact that her scale broke. She has no significant orthopnea.  Patient is a 59 y.o. female presenting with chest pain.  Chest Pain Associated symptoms: nausea and shortness of breath   Associated symptoms: no abdominal pain, no back pain, no diaphoresis, no dizziness, no fatigue, no fever, no headache, no numbness, not vomiting and no weakness     Past Medical History  Diagnosis Date  . 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 DYSLIPIDEMIA 05/01/2009  . OBESITY 07/24/2007  . OBSTRUCTIVE SLEEP APNEA 07/24/2007    with CPAP compliance  . RESTLESS LEG SYNDROME 07/24/2007  . SYSTOLIC HEART FAILURE, CHRONIC 01/05/2009    a. chemo induced cardiomyopathy b. cMRI (02/2009) EF 45% c. Myoview 05/2010: EF 59%, no ischemia d. RHC (01/2012) RA 20, RV 55/13/22, PA 56/34 (44), PCWP 28, Fick CO/CI: 3.1 / 1.5, PVR 5.3 WU, PA sat 42% & 48% e. ECHO (10/2012) EF 20-25%, diff HK, MV calcified annulus f. ECHO (03/2014) EF 30-35%, sev HK, inferior/inferoseptal walls, mild MR  . Palpitations 09/20/2010  .  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 Hypertension   . History of breast cancer 2006    with return in 2008, s/p mastectomy, XRT and chemotherapy  . Osteoarthritis   . Anxiety   . Depression     followed by a psychiatrist  . Asthma   . GERD (gastroesophageal reflux disease)   . Gastroparesis   . IBS (irritable bowel syndrome)   . Myocardial infarction   . ICD (implantable cardiac defibrillator) in place 11/10/2012    ICD (11/2012)  . COPD (chronic obstructive pulmonary disease)    Past Surgical History  Procedure Laterality Date  . Mastectomy      right  . Abdominal hysterectomy  1991  . Cardiac defibrillator placement  11/10/2012    SJM Fortify Assura VR implanted by Dr Rayann Heman for primary prevention  . Cholecystectomy    . Wrist surgery Bilateral   . Knee cartilage surgery    . Bi-ventricular implantable cardioverter defibrillator N/A 11/10/2012    Procedure: BI-VENTRICULAR IMPLANTABLE CARDIOVERTER DEFIBRILLATOR  (CRT-D);  Surgeon: Thompson Grayer, MD;  Location: [facility] CATH LAB;  Service: Cardiovascular;  Laterality: N/A;   Family History  Problem Relation Age of Onset  . Diabetes Mother     lung cancer, CHF, DM2  . Coronary artery disease Father 7    HTN, CABG x 3, DM2, deceased   History  Substance Use Topics  . Smoking status: Former Smoker    Quit date: 04/12/2001  . Smokeless tobacco: Never Used  . Alcohol Use: No   OB History    No data  available     Review of Systems  Constitutional: Negative for fever, chills, diaphoresis and fatigue.  HENT: Negative for congestion, rhinorrhea and sneezing.   Eyes: Negative.   Respiratory: Positive for shortness of breath. Negative for chest tightness.   Cardiovascular: Positive for chest pain. Negative for leg swelling.  Gastrointestinal: Positive for nausea. Negative for vomiting, abdominal pain, diarrhea and blood in stool.  Genitourinary: Negative for frequency, hematuria, flank pain and difficulty urinating.   Musculoskeletal: Negative for back pain and arthralgias.  Skin: Negative for rash.  Neurological: Negative for dizziness, speech difficulty, weakness, numbness and headaches.      Allergies  Bee venom; Aspirin; Ciprofloxacin; Codeine; Naproxen; Sulfonamide derivatives; Nsaids; and Tape  Home Medications   Prior to Admission medications   Medication Sig Start Date End Date Taking? Authorizing Provider  ACCU-CHEK FASTCLIX LANCETS [place]  12/13/12  Yes Historical Provider, MD  ACCU-CHEK SMARTVIEW test strip  12/13/12  Yes Historical Provider, MD  acetaminophen (TYLENOL ARTHRITIS PAIN) 650 MG CR tablet Take 1,300 mg by mouth every 8 (eight) hours as needed. pain   Yes Historical Provider, MD  albuterol (PROVENTIL HFA) 108 (90 BASE) MCG/ACT inhaler Inhale 2 puffs into the lungs every 6 (six) hours as needed. For shortness of breath   Yes Historical Provider, MD  atorvastatin (LIPITOR) 40 MG tablet Take 40 mg by mouth daily.     Yes Historical Provider, MD  baclofen (LIORESAL) 10 MG tablet Take 10 mg by mouth 2 (two) times daily as needed.   Yes Historical Provider, MD  busPIRone (BUSPAR) 30 MG tablet Take 30 mg by mouth 2 (two) times daily.    Yes Historical Provider, MD  carvedilol (COREG) 12.5 MG tablet Take 1.5 tablets (18.75 mg total) by mouth 2 (two) times daily with a meal. 06/15/14  Yes Jolaine Artist, MD  citalopram (CELEXA) 40 MG tablet Take 40 mg by mouth daily.   Yes Historical Provider, MD  digoxin (LANOXIN) 0.125 MG tablet Take 1 tablet (125 mcg total) by mouth every other day. 03/31/14  Yes Rande Brunt, NP  furosemide (LASIX) 20 MG tablet Take 3 tablets (60 mg total) by mouth 2 (two) times daily. 03/31/14  Yes Rande Brunt, NP  Glucosamine 500 MG CAPS Take 1 capsule by mouth daily.     Yes Historical Provider, MD  Insulin Pen Needle (B-D ULTRAFINE III SHORT PEN) 31G X 8 MM MISC 18/day as directed dx 250.00 08/01/11  Yes Renato Shin, MD  levothyroxine (SYNTHROID, LEVOTHROID)  75 MCG tablet Take 75 mcg by mouth daily.     Yes Historical Provider, MD  loperamide (IMODIUM A-D) 2 MG tablet Take 4 mg by mouth as needed for diarrhea or loose stools. diarrhea   Yes Historical Provider, MD  metolazone (ZAROXOLYN) 2.5 MG tablet Take 2.5 mg as needed for weight gain. 07/23/14  Yes Shaune Pascal Bensimhon, MD  mometasone (NASONEX) 50 MCG/ACT nasal spray Place 1 spray into both nostrils daily. allergies   Yes Historical Provider, MD  nitroGLYCERIN (NITROSTAT) 0.4 MG SL tablet Place 1 tablet (0.4 mg total) under the tongue every 5 (five) minutes as needed. For chest pain 03/08/14  Yes Jolaine Artist, MD  omega-3 acid ethyl esters (LOVAZA) 1 G capsule Take 2 g by mouth 2 (two) times daily.    Yes Historical Provider, MD  pantoprazole (PROTONIX) 40 MG tablet Take 40 mg by mouth 2 (two) times daily.     Yes Historical Provider, MD  potassium chloride  SA (K-DUR,KLOR-CON) 20 MEQ tablet Take 3 tablets (60 mEq total) by mouth 2 (two) times daily. 03/31/14  Yes Rande Brunt, NP  ramipril (ALTACE) 10 MG capsule Take 1 capsule (10 mg total) by mouth daily. 05/19/14  Yes Shaune Pascal Bensimhon, MD  rOPINIRole (REQUIP) 1 MG tablet Take 1 mg by mouth at bedtime.     Yes Historical Provider, MD  spironolactone (ALDACTONE) 25 MG tablet Take 1 tablet (25 mg total) by mouth daily. 03/31/14  Yes Rande Brunt, NP  SUMAtriptan (IMITREX) 25 MG tablet Take 25 mg by mouth every 2 (two) hours as needed. For migraine   Yes Historical Provider, MD  tiotropium (SPIRIVA) 18 MCG inhalation capsule Place 18 mcg into inhaler and inhale daily.     Yes Historical Provider, MD  TRESIBA FLEXTOUCH 200 UNIT/ML SOPN Inject 150 Units into the skin every morning. 12/24/14  Yes Historical Provider, MD   BP 107/43 mmHg  Pulse 78  Temp(Src) 97.7 F (36.5 C) (Oral)  Resp 13  Ht 5\' 5"  (1.651 m)  Wt 221 lb (100.245 kg)  BMI 36.78 kg/m2  SpO2 98% Physical Exam  Constitutional: She is oriented to person, place, and time. She  appears well-developed and well-nourished.  HENT:  Head: Normocephalic and atraumatic.  Eyes: Pupils are equal, round, and reactive to light.  Neck: Normal range of motion. Neck supple.  Cardiovascular: Normal rate, regular rhythm and normal heart sounds.   Pulmonary/Chest: Effort normal and breath sounds normal. No respiratory distress. She has no wheezes. She has no rales. She exhibits no tenderness.  Abdominal: Soft. Bowel sounds are normal. There is no tenderness. There is no rebound and no guarding.  Musculoskeletal: Normal range of motion. She exhibits no edema.  No edema or calf tenderness  Lymphadenopathy:    She has no cervical adenopathy.  Neurological: She is alert and oriented to person, place, and time.  Skin: Skin is warm and dry. No rash noted.  Psychiatric: She has a normal mood and affect.    ED Course  Procedures (including critical care time) Labs Review Labs Reviewed  BASIC METABOLIC PANEL - Abnormal; Notable for the following:    Chloride 100 (*)    Glucose, Bld 179 (*)    All other components within normal limits  CBC - Abnormal; Notable for the following:    RDW 16.1 (*)    All other components within normal limits  BRAIN NATRIURETIC PEPTIDE - Abnormal; Notable for the following:    B Natriuretic Peptide 180.5 (*)    All other components within normal limits  DIGOXIN LEVEL - Abnormal; Notable for the following:    Digoxin Level <0.2 (*)    All other components within normal limits  I-STAT TROPOININ, ED    Imaging Review Dg Chest 2 View  12/25/2014   CLINICAL DATA:  Chest pain x3 days, shortness of breath  EXAM: CHEST  2 VIEW  COMPARISON:  Chest radiograph dated 11/11/2012 and 05/21/2011  FINDINGS: Stable 12 mm nodule overlying the left mid lung, unchanged since 2012, likely a benign granuloma.  Increased interstitial markings. No focal consolidation. No pleural effusion or pneumothorax.  Mild cardiomegaly.  Left subclavian ICD.  IMPRESSION: No evidence of  acute cardiopulmonary disease.  Stable probable benign granuloma in the left mid lung, unchanged since 2012.   Electronically Signed   By: Julian Hy M.D.   On: 12/25/2014 10:34     EKG Interpretation   Date/Time:  Sunday Dec 25 2014 09:54:16  EDT Ventricular Rate:  88 PR Interval:  238 QRS Duration: 137 QT Interval:  396 QTC Calculation: 479 R Axis:   -71 Text Interpretation:  Sinus rhythm Prolonged PR interval Nonspecific IVCD  with LAD Left ventricular hypertrophy Anterior infarct, old ST elevation,  consider inferior injury since last tracing no significant change  Confirmed by Lorita Forinash  MD, Shruthi Northrup ([id number]) on 12/25/2014 10:13:50 AM      MDM   Final diagnoses:  Chest pain, unspecified chest pain type    Patient presents with chest pain worsening over last 3 days. She has several risk factors for coronary artery disease she has not had any recent cardiac imaging. She is currently pain-free. Her troponin is negative and she has no acute changes on her EKG. I consulted with cardiology who will see the patient.  14:28 Dr. Rayann Heman has seen pt and feels that pain is more likely musculoskeletal in origin.  Feels that pt can be discharged.  He will arrange for an outpt Myoview.  Pt does not want to wait for a second troponin.  Malvin Johns, MD 12/25/14 [place], MD 12/25/14 1430

## 2014-12-25 NOTE — ED Notes (Signed)
Intermittent CP x 3 days; this morning had two hour episode of chest pain. Did not take ASA because allergic. Pain free during EMS transport and on arrival.

## 2014-12-25 NOTE — ED Notes (Signed)
Patient transported to X-ray 

## 2014-12-25 NOTE — ED Notes (Signed)
Patient returned from X-ray 

## 2014-12-25 NOTE — Discharge Instructions (Signed)

## 2014-12-25 NOTE — Consult Note (Signed)
CARDIOLOGY CONSULT NOTE     Primary Care Physician: Nicoletta Dress, MD Referring Physician:  ED  Admit Date: 12/25/2014  Reason for consultation:  CP  [name] is a 59 y.o. female with a h/o nonischemic CM, obesity, OSA, who presents with chest pain.  She reports doing reasonably well recently.  She has been cleaning out her mothers room (mother died a year ago) and has been lifting a lot over the past few days.  Since that time, she has had some soreness in her L chest. This is worse with chest wall movement.  She had similar pain this am for which she presented to [facility].  She has been chest pain free since she got her. Her SOB is chronic.  She is otherwise without complaint.  She wants to go home.  Today, she denies symptoms of palpitations,   dizziness, presyncope, syncope, or neurologic sequela. The patient is tolerating medications without difficulties and is otherwise without complaint today.   Past Medical History  Diagnosis Date  . 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 DYSLIPIDEMIA 05/01/2009  . OBESITY 07/24/2007  . OBSTRUCTIVE SLEEP APNEA 07/24/2007    with CPAP compliance  . RESTLESS LEG SYNDROME 07/24/2007  . SYSTOLIC HEART FAILURE, CHRONIC 01/05/2009    a. chemo induced cardiomyopathy b. cMRI (02/2009) EF 45% c. Myoview 05/2010: EF 59%, no ischemia d. RHC (01/2012) RA 20, RV 55/13/22, PA 56/34 (44), PCWP 28, Fick CO/CI: 3.1 / 1.5, PVR 5.3 WU, PA sat 42% & 48% e. ECHO (10/2012) EF 20-25%, diff HK, MV calcified annulus f. ECHO (03/2014) EF 30-35%, sev HK, inferior/inferoseptal walls, mild MR  . Palpitations 09/20/2010  . 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 Hypertension   . History of breast cancer 2006    with return in 2008, s/p mastectomy, XRT and chemotherapy  . Osteoarthritis   . Anxiety   . Depression     followed by a psychiatrist  . Asthma   . GERD (gastroesophageal reflux disease)   . Gastroparesis   . IBS (irritable bowel syndrome)   . Myocardial infarction   . ICD  (implantable cardiac defibrillator) in place 11/10/2012    ICD (11/2012)  . COPD (chronic obstructive pulmonary disease)    Past Surgical History  Procedure Laterality Date  . Mastectomy      right  . Abdominal hysterectomy  1991  . Cardiac defibrillator placement  11/10/2012    SJM Fortify Assura VR implanted by Dr Rayann Heman for primary prevention  . Cholecystectomy    . Wrist surgery Bilateral   . Knee cartilage surgery    . Bi-ventricular implantable cardioverter defibrillator N/A 11/10/2012    Procedure: BI-VENTRICULAR IMPLANTABLE CARDIOVERTER DEFIBRILLATOR  (CRT-D);  Surgeon: Thompson Grayer, MD;  Location: [facility] CATH LAB;  Service: Cardiovascular;  Laterality: N/A;       Allergies  Allergen Reactions  . Bee Venom Anaphylaxis  . Aspirin Hives  . Ciprofloxacin Hives  . Codeine Nausea Only    "can take ONLY if she eats with med"  . Naproxen Hives  . Sulfonamide Derivatives Hives  . Nsaids Itching and Rash  . Tape Rash    Also allergic to metal    History   Social History  . Marital Status: Divorced    Spouse Name: N/A  . Number of Children: N/A  . Years of Education: N/A   Occupational History  . Disabled    Social History Main Topics  . Smoking status: Former Smoker    Quit date: 04/12/2001  .  Smokeless tobacco: Never Used  . Alcohol Use: No  . Drug Use: No  . Sexual Activity: Not Currently   Other Topics Concern  . Not on file   Social History Narrative   Lives with mother and brother Philis Nettle          Family History  Problem Relation Age of Onset  . Diabetes Mother     lung cancer, CHF, DM2  . Coronary artery disease Father 30    HTN, CABG x 3, DM2, deceased    ROS- All systems are reviewed and negative except as per the HPI above  Physical Exam: Telemetry: Filed Vitals:   12/25/14 1100 12/25/14 1200 12/25/14 1300 12/25/14 1400  BP: 105/53 107/43 104/41 108/51  Pulse: 71 78 73 76  Temp:      TempSrc:      Resp: 15 13 20 24   Height:        Weight:      SpO2: 97% 98% 98% 98%    GEN- The patient is elderly appearing, alert and oriented x 3 today.   Head- normocephalic, atraumatic Eyes-  Sclera clear, conjunctiva pink Ears- hearing intact Oropharynx- clear Neck- supple,   Lungs- Clear to ausculation bilaterally, normal work of breathing Heart- Regular rate and rhythm  GI- soft, NT, ND, + BS Extremities- no clubbing, cyanosis, + chronic edema MS- no significant deformity or atrophy Skin- no rash or lesion Psych- euthymic mood, full affect Neuro- strength and sensation are intact  EKG: reveals sinus rhythm with LBBB  Labs:   Lab Results  Component Value Date   WBC 7.6 12/25/2014   HGB 12.7 12/25/2014   HCT 38.7 12/25/2014   MCV 86.0 12/25/2014   PLT 249 12/25/2014    Recent Labs Lab 12/25/14 1020  NA 136  K 3.7  CL 100*  CO2 28  BUN 17  CREATININE 0.82  CALCIUM 9.9  GLUCOSE 179*   Lab Results  Component Value Date   CKTOTAL 70 05/05/2010   CKMB 1.4 05/05/2010   TROPONINI 0.02        NO INDICATION OF MYOCARDIAL INJURY. 05/05/2010    Lab Results  Component Value Date   CHOL  05/05/2010    146        ATP III CLASSIFICATION:  <200     mg/dL   Desirable  200-239  mg/dL   Borderline High  >=240    mg/dL   High          Lab Results  Component Value Date   HDL 38* 05/05/2010   Lab Results  Component Value Date   LDLCALC  05/05/2010    UNABLE TO CALCULATE IF TRIGLYCERIDE OVER 400 mg/dL        Total Cholesterol/HDL:CHD Risk Coronary Heart Disease Risk Table                     Men   Women  1/2 Average Risk   3.4   3.3  Average Risk       5.0   4.4  2 X Average Risk   9.6   7.1  3 X Average Risk  23.4   11.0        Use the calculated Patient Ratio above and the CHD Risk Table to determine the patient's CHD Risk.        ATP III CLASSIFICATION (LDL):  <100     mg/dL   Optimal  100-129  mg/dL   Near or Above  Optimal  130-159  mg/dL   Borderline  160-189  mg/dL    High  >190     mg/dL   Very High   Lab Results  Component Value Date   TRIG 685* 05/05/2010   Lab Results  Component Value Date   CHOLHDL 3.8 05/05/2010   No results found for: LDLDIRECT    Radiology: CXR is reviewed  Echo: last echo is reviewed  ASSESSMENT AND PLAN:   1. Atypical chest pain Appears to be musculoskeletal in etiology No ischemic ekg changes and negative troponin She is clear that she does not wish to be admitted at this time. I think that an outpatient stress test would be a reasonable alternative to better risk stratify this patient  2. Chronic systolic dysfunction Stable No change required today  3. HTN Stable No change required today  Ok to discharge from Cardiology standpoint I will arrange lexiscan myoview as an outpatient   Thompson Grayer, MD 12/25/2014  2:36 PM

## 2014-12-28 ENCOUNTER — Other Ambulatory Visit: Payer: Self-pay | Admitting: Internal Medicine

## 2014-12-28 ENCOUNTER — Other Ambulatory Visit (HOSPITAL_COMMUNITY): Payer: Self-pay | Admitting: *Deleted

## 2014-12-28 DIAGNOSIS — I5022 Chronic systolic (congestive) heart failure: Secondary | ICD-10-CM

## 2014-12-28 DIAGNOSIS — R079 Chest pain, unspecified: Secondary | ICD-10-CM

## 2014-12-28 MED ORDER — CARVEDILOL 12.5 MG PO TABS: 18.7500 mg | ORAL_TABLET | Freq: Two times a day (BID) | ORAL | Status: DC

## 2014-12-29 ENCOUNTER — Telehealth (HOSPITAL_COMMUNITY): Payer: Self-pay | Admitting: *Deleted

## 2014-12-29 NOTE — Telephone Encounter (Signed)
Patient given detailed instructions per Myocardial Perfusion Study Information Sheet for test on 01/03/15 at 8:00 am Patient verbalized understanding. Hubbard Robinson, RN

## 2015-01-03 ENCOUNTER — Ambulatory Visit (HOSPITAL_COMMUNITY): Payer: Medicare Other | Attending: Cardiovascular Disease

## 2015-01-03 DIAGNOSIS — R079 Chest pain, unspecified: Secondary | ICD-10-CM

## 2015-01-03 LAB — MYOCARDIAL PERFUSION IMAGING
CHL CUP NUCLEAR SDS: 10
CHL CUP STRESS STAGE 1 GRADE: 0 %
CHL CUP STRESS STAGE 2 GRADE: 0 %
CHL CUP STRESS STAGE 2 SPEED: 0 mph
CHL CUP STRESS STAGE 3 HR: 80 {beats}/min
CHL CUP STRESS STAGE 4 GRADE: 0 %
CHL CUP STRESS STAGE 5 DBP: 53 mmHg
CHL CUP STRESS STAGE 5 GRADE: 0 %
CHL CUP STRESS STAGE 5 SBP: 93 mmHg
CHL CUP STRESS STAGE 5 SPEED: 0 mph
CSEPPMHR: 55 %
LHR: 0.37
LV dias vol: 136 mL
LVSYSVOL: 84 mL
Nuc Stress EF: 38 %
Peak HR: 89 {beats}/min
Rest HR: 74 {beats}/min
SRS: 7
SSS: 17
Stage 1 HR: 72 {beats}/min
Stage 1 Speed: 0 mph
Stage 2 HR: 72 {beats}/min
Stage 3 DBP: 56 mmHg
Stage 3 Grade: 0 %
Stage 3 SBP: 98 mmHg
Stage 3 Speed: 0 mph
Stage 4 HR: 89 {beats}/min
Stage 4 Speed: 0 mph
Stage 5 HR: 82 {beats}/min
Stage 6 Grade: 0 %
Stage 6 HR: 83 {beats}/min
Stage 6 Speed: 0 mph
TID: 0.96

## 2015-01-03 MED ORDER — TECHNETIUM TC 99M SESTAMIBI GENERIC - CARDIOLITE
10.8000 | Freq: Once | INTRAVENOUS | Status: AC | PRN
  Administered 2015-01-03: 11 via INTRAVENOUS

## 2015-01-03 MED ORDER — TECHNETIUM TC 99M SESTAMIBI GENERIC - CARDIOLITE
33.0000 | Freq: Once | INTRAVENOUS | Status: AC | PRN
  Administered 2015-01-03: 33 via INTRAVENOUS

## 2015-01-03 MED ORDER — REGADENOSON 0.4 MG/5ML IV SOLN
0.4000 mg | Freq: Once | INTRAVENOUS | Status: AC
  Administered 2015-01-03: 0.4 mg via INTRAVENOUS

## 2015-01-04 ENCOUNTER — Encounter: Payer: Self-pay | Admitting: Internal Medicine

## 2015-01-12 DIAGNOSIS — R0789 Other chest pain: Secondary | ICD-10-CM | POA: Diagnosis not present

## 2015-01-12 DIAGNOSIS — Z6836 Body mass index (BMI) 36.0-36.9, adult: Secondary | ICD-10-CM | POA: Diagnosis not present

## 2015-01-12 DIAGNOSIS — E1165 Type 2 diabetes mellitus with hyperglycemia: Secondary | ICD-10-CM | POA: Diagnosis not present

## 2015-01-16 ENCOUNTER — Telehealth: Payer: Self-pay | Admitting: *Deleted

## 2015-01-16 DIAGNOSIS — C50511 Malignant neoplasm of lower-outer quadrant of right female breast: Secondary | ICD-10-CM

## 2015-01-16 NOTE — Telephone Encounter (Signed)
Order placed for mammogram to [facility] - will fax and scan after MD returns for signature.  Last mammo  07/19/14.  This order placed for approx 12/16.   Attempted to call patient to confirm due date - patient voice mail not set up.

## 2015-01-16 NOTE — Telephone Encounter (Signed)
VM message from pt stating she needs referral for her mammogram scheduled for 02/01/15 @ [place].  "Needs MD ok"

## 2015-01-16 NOTE — Addendum Note (Signed)
Addended by: Prentiss Bells on: 01/16/2015 01:18 PM   Modules accepted: Orders

## 2015-01-17 ENCOUNTER — Telehealth: Payer: Self-pay

## 2015-01-17 NOTE — Telephone Encounter (Signed)
Pt called stating she needs order to [facility] for her mammogram. She has an appt for late in June.  Called her back and stated that Coralyn Mark has the order ready for Dr Geralyn Flash signature when he returns to office after vacation.

## 2015-01-19 NOTE — Telephone Encounter (Signed)
Called pt back that mammogram is in one year. Pt stated that [facility] stated she will need another mammogram in 6 months. [facility] sent her an appt letter last week. Pt also stated this was first time in several years they were saying to get a mammogram in 6 months rather than a year. She had to get additional views done last Dec.  Pt will call her insurance company. She understands Dr Lindi Adie is on vacation this week.

## 2015-01-19 NOTE — Telephone Encounter (Signed)
Contacted [facility] - pt had a follow up to 12/15 mammo on 12/22.  We do not have those results.  Per [facility] they recommend a 6 month follow up.  Requested copy of results faxed to [facility].  Attempted to contact pt 4 times today - VM not set up.

## 2015-01-24 ENCOUNTER — Telehealth: Payer: Self-pay

## 2015-01-24 NOTE — Telephone Encounter (Signed)
Order for mammogram faxed to [facility].  Sent to scan.

## 2015-02-01 DIAGNOSIS — R920 Mammographic microcalcification found on diagnostic imaging of breast: Secondary | ICD-10-CM | POA: Diagnosis not present

## 2015-02-15 DIAGNOSIS — H40033 Anatomical narrow angle, bilateral: Secondary | ICD-10-CM | POA: Diagnosis not present

## 2015-02-15 DIAGNOSIS — H25043 Posterior subcapsular polar age-related cataract, bilateral: Secondary | ICD-10-CM | POA: Diagnosis not present

## 2015-02-17 ENCOUNTER — Telehealth: Payer: Self-pay

## 2015-02-17 NOTE — Telephone Encounter (Signed)
Mammogram results dtd 02/01/15 rcvd from [place].  Reviewed by Dr. Lavonda Jumbo to scan.

## 2015-02-24 NOTE — Telephone Encounter (Signed)
Charting error.

## 2015-02-27 DIAGNOSIS — H2512 Age-related nuclear cataract, left eye: Secondary | ICD-10-CM | POA: Diagnosis not present

## 2015-03-02 DIAGNOSIS — H2512 Age-related nuclear cataract, left eye: Secondary | ICD-10-CM | POA: Diagnosis not present

## 2015-03-14 DIAGNOSIS — I10 Essential (primary) hypertension: Secondary | ICD-10-CM | POA: Diagnosis not present

## 2015-03-14 DIAGNOSIS — E785 Hyperlipidemia, unspecified: Secondary | ICD-10-CM | POA: Diagnosis not present

## 2015-03-14 DIAGNOSIS — I509 Heart failure, unspecified: Secondary | ICD-10-CM | POA: Diagnosis not present

## 2015-03-14 DIAGNOSIS — E1140 Type 2 diabetes mellitus with diabetic neuropathy, unspecified: Secondary | ICD-10-CM | POA: Diagnosis not present

## 2015-03-14 DIAGNOSIS — E039 Hypothyroidism, unspecified: Secondary | ICD-10-CM | POA: Diagnosis not present

## 2015-03-30 DIAGNOSIS — F329 Major depressive disorder, single episode, unspecified: Secondary | ICD-10-CM | POA: Diagnosis not present

## 2015-03-30 DIAGNOSIS — I10 Essential (primary) hypertension: Secondary | ICD-10-CM | POA: Diagnosis not present

## 2015-03-30 DIAGNOSIS — Z6835 Body mass index (BMI) 35.0-35.9, adult: Secondary | ICD-10-CM | POA: Diagnosis not present

## 2015-03-30 DIAGNOSIS — R531 Weakness: Secondary | ICD-10-CM | POA: Diagnosis not present

## 2015-03-30 DIAGNOSIS — R112 Nausea with vomiting, unspecified: Secondary | ICD-10-CM | POA: Diagnosis not present

## 2015-04-05 ENCOUNTER — Encounter: Payer: Self-pay | Admitting: *Deleted

## 2015-04-06 DIAGNOSIS — M542 Cervicalgia: Secondary | ICD-10-CM | POA: Diagnosis not present

## 2015-04-06 DIAGNOSIS — H6090 Unspecified otitis externa, unspecified ear: Secondary | ICD-10-CM | POA: Diagnosis not present

## 2015-04-06 DIAGNOSIS — F329 Major depressive disorder, single episode, unspecified: Secondary | ICD-10-CM | POA: Diagnosis not present

## 2015-04-06 DIAGNOSIS — R531 Weakness: Secondary | ICD-10-CM | POA: Diagnosis not present

## 2015-04-11 DIAGNOSIS — L089 Local infection of the skin and subcutaneous tissue, unspecified: Secondary | ICD-10-CM | POA: Diagnosis not present

## 2015-04-11 DIAGNOSIS — Z6836 Body mass index (BMI) 36.0-36.9, adult: Secondary | ICD-10-CM | POA: Diagnosis not present

## 2015-04-21 ENCOUNTER — Other Ambulatory Visit (HOSPITAL_COMMUNITY): Payer: Self-pay | Admitting: Anesthesiology

## 2015-04-26 ENCOUNTER — Other Ambulatory Visit (HOSPITAL_COMMUNITY): Payer: Self-pay | Admitting: Anesthesiology

## 2015-04-26 DIAGNOSIS — I5022 Chronic systolic (congestive) heart failure: Secondary | ICD-10-CM

## 2015-04-26 MED ORDER — POTASSIUM CHLORIDE CRYS ER 20 MEQ PO TBCR: 60.0000 meq | EXTENDED_RELEASE_TABLET | Freq: Two times a day (BID) | ORAL | Status: DC

## 2015-04-26 MED ORDER — CARVEDILOL 12.5 MG PO TABS: 18.7500 mg | ORAL_TABLET | Freq: Two times a day (BID) | ORAL | Status: DC

## 2015-05-01 ENCOUNTER — Ambulatory Visit (INDEPENDENT_AMBULATORY_CARE_PROVIDER_SITE_OTHER): Payer: Medicare Other | Admitting: *Deleted

## 2015-05-01 ENCOUNTER — Encounter: Payer: Self-pay | Admitting: Internal Medicine

## 2015-05-01 DIAGNOSIS — I471 Supraventricular tachycardia: Secondary | ICD-10-CM | POA: Diagnosis not present

## 2015-05-01 DIAGNOSIS — R000 Tachycardia, unspecified: Secondary | ICD-10-CM

## 2015-05-01 DIAGNOSIS — I5022 Chronic systolic (congestive) heart failure: Secondary | ICD-10-CM

## 2015-05-02 ENCOUNTER — Encounter: Payer: Self-pay | Admitting: Internal Medicine

## 2015-05-03 ENCOUNTER — Encounter: Payer: Self-pay | Admitting: *Deleted

## 2015-05-10 NOTE — Progress Notes (Signed)
Remote ICD transmission.   

## 2015-05-12 LAB — CUP PACEART REMOTE DEVICE CHECK
HighPow Impedance: 89 Ohm
Lead Channel Impedance Value: 490 Ohm
Lead Channel Sensing Intrinsic Amplitude: 12 mV
Lead Channel Setting Sensing Sensitivity: 0.5 mV
MDC IDC MSMT BATTERY REMAINING PERCENTAGE: 76 %
MDC IDC SESS DTM: 20161007113345
MDC IDC SET LEADCHNL RV PACING AMPLITUDE: 2.5 V
MDC IDC SET LEADCHNL RV PACING PULSEWIDTH: 0.5 ms
MDC IDC SET ZONE DETECTION INTERVAL: 270 ms
MDC IDC SET ZONE DETECTION INTERVAL: 330 ms
MDC IDC STAT BRADY RV PERCENT PACED: 1 % — AB
Pulse Gen Serial Number: 7066421

## 2015-05-16 ENCOUNTER — Encounter: Payer: Self-pay | Admitting: Cardiology

## 2015-05-17 ENCOUNTER — Ambulatory Visit (HOSPITAL_COMMUNITY)
Admission: RE | Admit: 2015-05-17 | Discharge: 2015-05-17 | Disposition: A | Discharge status: Home / Self Care | Payer: Medicare Other | Source: Ambulatory Visit | Attending: Cardiology | Admitting: Cardiology

## 2015-05-17 VITALS — BP 94/64 | HR 106 | Wt 220.0 lb

## 2015-05-17 DIAGNOSIS — Z95810 Presence of automatic (implantable) cardiac defibrillator: Secondary | ICD-10-CM | POA: Diagnosis not present

## 2015-05-17 DIAGNOSIS — F419 Anxiety disorder, unspecified: Secondary | ICD-10-CM | POA: Insufficient documentation

## 2015-05-17 DIAGNOSIS — I10 Essential (primary) hypertension: Secondary | ICD-10-CM | POA: Diagnosis not present

## 2015-05-17 DIAGNOSIS — Z853 Personal history of malignant neoplasm of breast: Secondary | ICD-10-CM | POA: Diagnosis not present

## 2015-05-17 DIAGNOSIS — E785 Hyperlipidemia, unspecified: Secondary | ICD-10-CM | POA: Insufficient documentation

## 2015-05-17 DIAGNOSIS — I5023 Acute on chronic systolic (congestive) heart failure: Secondary | ICD-10-CM

## 2015-05-17 DIAGNOSIS — Z8249 Family history of ischemic heart disease and other diseases of the circulatory system: Secondary | ICD-10-CM | POA: Insufficient documentation

## 2015-05-17 DIAGNOSIS — Z833 Family history of diabetes mellitus: Secondary | ICD-10-CM | POA: Insufficient documentation

## 2015-05-17 DIAGNOSIS — I252 Old myocardial infarction: Secondary | ICD-10-CM | POA: Insufficient documentation

## 2015-05-17 DIAGNOSIS — K219 Gastro-esophageal reflux disease without esophagitis: Secondary | ICD-10-CM | POA: Diagnosis not present

## 2015-05-17 DIAGNOSIS — F329 Major depressive disorder, single episode, unspecified: Secondary | ICD-10-CM | POA: Diagnosis not present

## 2015-05-17 DIAGNOSIS — E669 Obesity, unspecified: Secondary | ICD-10-CM | POA: Diagnosis not present

## 2015-05-17 DIAGNOSIS — Z79899 Other long term (current) drug therapy: Secondary | ICD-10-CM | POA: Insufficient documentation

## 2015-05-17 DIAGNOSIS — Z87891 Personal history of nicotine dependence: Secondary | ICD-10-CM | POA: Insufficient documentation

## 2015-05-17 DIAGNOSIS — Z794 Long term (current) use of insulin: Secondary | ICD-10-CM | POA: Insufficient documentation

## 2015-05-17 DIAGNOSIS — J449 Chronic obstructive pulmonary disease, unspecified: Secondary | ICD-10-CM | POA: Diagnosis not present

## 2015-05-17 DIAGNOSIS — I959 Hypotension, unspecified: Secondary | ICD-10-CM

## 2015-05-17 DIAGNOSIS — I5022 Chronic systolic (congestive) heart failure: Secondary | ICD-10-CM

## 2015-05-17 DIAGNOSIS — E119 Type 2 diabetes mellitus without complications: Secondary | ICD-10-CM | POA: Diagnosis not present

## 2015-05-17 DIAGNOSIS — I2510 Atherosclerotic heart disease of native coronary artery without angina pectoris: Secondary | ICD-10-CM | POA: Insufficient documentation

## 2015-05-17 DIAGNOSIS — I428 Other cardiomyopathies: Secondary | ICD-10-CM | POA: Diagnosis not present

## 2015-05-17 DIAGNOSIS — G4733 Obstructive sleep apnea (adult) (pediatric): Secondary | ICD-10-CM | POA: Diagnosis not present

## 2015-05-17 LAB — BASIC METABOLIC PANEL
Anion gap: 13 (ref 5–15)
BUN: 27 mg/dL — ABNORMAL HIGH (ref 6–20)
CALCIUM: 10 mg/dL (ref 8.9–10.3)
CHLORIDE: 88 mmol/L — AB (ref 101–111)
CO2: 27 mmol/L (ref 22–32)
CREATININE: 1.16 mg/dL — AB (ref 0.44–1.00)
GFR calc non Af Amer: 50 mL/min — ABNORMAL LOW (ref >60)
GFR, EST AFRICAN AMERICAN: 59 mL/min — AB (ref >60)
GLUCOSE: 482 mg/dL — AB (ref 65–99)
Potassium: 4.5 mmol/L (ref 3.5–5.1)
Sodium: 128 mmol/L — ABNORMAL LOW (ref 135–145)

## 2015-05-17 MED ORDER — IVABRADINE HCL 5 MG PO TABS: 2.5000 mg | ORAL_TABLET | Freq: Two times a day (BID) | ORAL | Status: DC

## 2015-05-17 NOTE — Progress Notes (Signed)
Patient ID: [name], female   DOB: February 11, 1956, 59 y.o.   MRN: [id number] Endocrinologist: Dr Loanne Drilling  Oncologist: Dr. Humphrey Rolls PCP: [name], NP [facility])  HPI Ms. Alcaraz is a 59 year old female with history of breast cancer, obesity, HTN, DM, IBS, HLD, OSA, chemo induced cardiomyopathy s/p ICD and chronic systolic HF.    She had chemo induced cardiomyopathy.  Her EF has fallen from 45% per Cardiac MRI in July 2010 to 20-25% in 10/2012. S/P single chamber ICD (St Jude) implantation on 11/10/12 by Dr. Rayann Heman.     Heart Failure Follow Up: Has not been seen in almost 1 year. Overall feeling ok. Denies SOB/PND/Orthopnea. Takes an extra fluid pill as needed. Taking all medications. Intermittent dizziness after taking meds. Weight at home 215-220 pounds.  Wearing CPAP at night.  Following a low salt diet and drinking less than 2L a day.   [place] on 01/30/12 showed decompensated HF with low cardiac output consistent with cardiogenic shock.  [place] 6/27  RA = 20  RV = 55/13/22  PA =56/34 (44)  PCW = 28  Fick cardiac output/index = 3.1/1.5  PVR = 5.3 [place]  FA sat = 91%  PA sat = 42%, 48%  SVC sat = 46%  Echo 08/12/11: Left ventricle: mildly dilated, EF 20% to 25%. Diffuse hypokinesis. Doppler parameters are consistent with restrictive physiology, indicative of decreased left ventricular diastolic compliance and/or increased left atrial pressure. Left atrium was moderately dilated. Right atrium mildly dilated. 04/09/12 ECHO EF 30-35% LV mildly dilated  Hyopkinesis 10/07/12 ECHO EF 20-25% RV ok. 03/31/14: ECHO EF 30-35%, severe HK inferior/inferoseptal walls, mild MR, LA mod dilated, RA mildly dilated  Myoview in 05/2010 EF 59%. No ischemia.     ROS: All systems negative except as listed in HPI, PMH and Problem List.  Social History   Social History  . Marital Status: Divorced    Spouse Name: N/A  . Number of Children: N/A  . Years of Education: N/A   Occupational History   . Disabled    Social History Main Topics  . Smoking status: Former Smoker    Quit date: 04/12/2001  . Smokeless tobacco: Never Used  . Alcohol Use: No  . Drug Use: No  . Sexual Activity: Not Currently   Other Topics Concern  . Not on file   Social History Narrative   Lives with mother and brother Philis Nettle         Family History  Problem Relation Age of Onset  . Diabetes Mother     lung cancer, CHF, DM2  . Coronary artery disease Father 47    HTN, CABG x 3, DM2, deceased    Past Medical History  Diagnosis Date  . 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 DYSLIPIDEMIA 05/01/2009  . OBESITY 07/24/2007  . OBSTRUCTIVE SLEEP APNEA 07/24/2007    with CPAP compliance  . RESTLESS LEG SYNDROME 07/24/2007  . SYSTOLIC HEART FAILURE, CHRONIC 01/05/2009    a. chemo induced cardiomyopathy b. cMRI (02/2009) EF 45% c. Myoview 05/2010: EF 59%, no ischemia d. RHC (01/2012) RA 20, RV 55/13/22, PA 56/34 (44), PCWP 28, Fick CO/CI: 3.1 / 1.5, PVR 5.3 WU, PA sat 42% & 48% e. ECHO (10/2012) EF 20-25%, diff HK, MV calcified annulus f. ECHO (03/2014) EF 30-35%, sev HK, inferior/inferoseptal walls, mild MR  . Palpitations 09/20/2010  . 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 Hypertension   . History of breast cancer 2006    with return in 2008, s/p mastectomy,  XRT and chemotherapy  . Osteoarthritis   . Anxiety   . Depression     followed by a psychiatrist  . Asthma   . GERD (gastroesophageal reflux disease)   . Gastroparesis   . IBS (irritable bowel syndrome)   . Myocardial infarction   . ICD (implantable cardiac defibrillator) in place 11/10/2012    ICD (11/2012)  . COPD (chronic obstructive pulmonary disease)     Current Outpatient Prescriptions  Medication Sig Dispense Refill  . ACCU-CHEK FASTCLIX LANCETS MISC     . ACCU-CHEK SMARTVIEW test strip     . acetaminophen (TYLENOL ARTHRITIS PAIN) 650 MG CR tablet Take 1,300 mg by mouth every 8 (eight) hours as needed. pain    . albuterol (PROVENTIL HFA) 108 (90 BASE)  MCG/ACT inhaler Inhale 2 puffs into the lungs every 6 (six) hours as needed. For shortness of breath    . ALPRAZolam (XANAX) 0.25 MG tablet Take 0.25 mg by mouth as needed for anxiety.    Marland Kitchen atorvastatin (LIPITOR) 40 MG tablet Take 40 mg by mouth daily.      . baclofen (LIORESAL) 10 MG tablet Take 10 mg by mouth 2 (two) times daily as needed.    . busPIRone (BUSPAR) 30 MG tablet Take 30 mg by mouth 2 (two) times daily.     . carvedilol (COREG) 12.5 MG tablet Take 1.5 tablets (18.75 mg total) by mouth 2 (two) times daily with a meal. 180 tablet 3  . DIGOX 125 MCG tablet TAKE 1 TABLET BY MOUTH EVERY OTHER DAY 45 tablet 2  . DULoxetine (CYMBALTA) 30 MG capsule Take 30 mg by mouth daily.    . furosemide (LASIX) 20 MG tablet TAKE 3 TABLETS BY MOUTH 2 TIMES DAILY 540 tablet 3  . Glucosamine 500 MG CAPS Take 1 capsule by mouth daily.      . Insulin Pen Needle (B-D ULTRAFINE III SHORT PEN) 31G X 8 MM MISC 18/day as directed dx 250.00 500 each 11  . levothyroxine (SYNTHROID, LEVOTHROID) 75 MCG tablet Take 75 mcg by mouth daily.      Marland Kitchen loperamide (IMODIUM A-D) 2 MG tablet Take 4 mg by mouth as needed for diarrhea or loose stools. diarrhea    . metolazone (ZAROXOLYN) 2.5 MG tablet Take 2.5 mg as needed for weight gain. 30 tablet 3  . mometasone (NASONEX) 50 MCG/ACT nasal spray Place 1 spray into both nostrils daily. allergies    . nitroGLYCERIN (NITROSTAT) 0.4 MG SL tablet Place 1 tablet (0.4 mg total) under the tongue every 5 (five) minutes as needed. For chest pain 25 tablet 3  . omega-3 acid ethyl esters (LOVAZA) 1 G capsule Take 2 g by mouth 2 (two) times daily.     . pantoprazole (PROTONIX) 40 MG tablet Take 40 mg by mouth 2 (two) times daily.      . potassium chloride SA (K-DUR,KLOR-CON) 20 MEQ tablet Take 3 tablets (60 mEq total) by mouth 2 (two) times daily. 360 tablet 3  . ramipril (ALTACE) 10 MG capsule Take 1 capsule (10 mg total) by mouth daily. 90 capsule 3  . rOPINIRole (REQUIP) 1 MG tablet  Take 1 mg by mouth at bedtime.      Marland Kitchen spironolactone (ALDACTONE) 25 MG tablet TAKE 1 TABLET BY MOUTH DAILY 90 tablet 3  . SUMAtriptan (IMITREX) 25 MG tablet Take 25 mg by mouth every 2 (two) hours as needed. For migraine    . tiotropium (SPIRIVA) 18 MCG inhalation capsule Place 18 mcg  into inhaler and inhale daily.      . TRESIBA FLEXTOUCH 200 UNIT/ML SOPN Inject 150 Units into the skin every morning.     No current facility-administered medications for this encounter.   Filed Vitals:   05/17/15 1358  BP: 94/64  Pulse: 106  Weight: 220 lb (99.791 kg)  SpO2: 96%     PHYSICAL EXAM: General:  Obese, Well appearing. HEENT: normal except for poor dentition Neck: supple. JVP hard to see but dose not appear elevated, carotids 2+ bilaterally; no bruits. No lymphadenopathy or thryomegaly appreciated. Cor: PMI normal. Regular rate & rhythm. no S3. Lungs: clear Abdomen: soft, obese, nontender, no distention. No hepatosplenomegaly. No bruits or masses. Good bowel sounds. Extremities: no cyanosis, clubbing, rash, no edema Neuro: alert & orientedx3, cranial nerves grossly intact. Moves all 4 extremities w/o difficulty. Affect pleasant.   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 Will continue lasix 60 mg BID and discussed the use of sliding scale diuretics. Continue spiro 25 mg daily.  - Continue coreg 18.75 mg BID.  - Continue ramipril 10 mg daily  -Continue digoxin 0.125 mg QOD.  - Add 2.5 mg corlanor twice daily.  -Check BMET today.   - Reinforced the need and importance of daily weights, a low sodium diet, and fluid restriction (less than 2 L a day). Instructed to call the HF clinic if weight increases more than 3 lbs overnight or 5 lbs in a week.  2) HTN - a little low today but asymptomatic.  Continue current medications.Marland Kitchen  3) OSA - Compliant with CPAP nightly. 4) HLD -  Followed by PCP.  5) Obesity - Discussed with her trying to get back on her diet and to watch her portion sizes.  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 Will continue statin, BB and ACE-I. No s/s of ischemia.  Follow up in 6 weeks with an an ECHO.   Terril Amaro NP-C 2:10 PM

## 2015-05-17 NOTE — Progress Notes (Signed)
Medication Samples have been provided to the patient.  Drug name: Corlanor  Qty: 14  LOT: [id number]  Exp.Date: 09/17  The patient has been instructed regarding the correct time, dose, and frequency of taking this medication, including desired effects and most common side effects.   Philemon Kingdom D 2:23 PM 05/17/2015

## 2015-05-17 NOTE — Patient Instructions (Signed)
Labs today will call if abnormal   Stop Dig  Start Corlanor 2.5 mg twice a day  Follow up in 6 weeks   Echocardiogram in 6 weeks with appointment  Ivabradine tablets What is this medicine? IVABRADINE (eye VAB ra deen) is used to reduce the risk of going to the hospital due to worsening heart failure. This medicine may be used for other purposes; ask your health care provider or pharmacist if you have questions. What should I tell my health care provider before I take this medicine? They need to know if you have any of these conditions: -certain heart conditions like sick sinus syndrome, sinoatrial block, or third degree atrioventricular block -heart failure that has recently worsened -liver disease -low blood pressure (less than 90/50 mmHg) -low resting heart rate (less than 60 beats per minute) -pacemaker -an unusual or allergic reaction to ivabradine, other medicines, foods, dyes, or preservatives -pregnant or trying to get pregnant -breast-feeding How should I use this medicine? Take this medicine by mouth with a glass of water. Follow the directions on the prescription label. Avoid grapefruit juice. Take your medicine at regular intervals. Do not take it more often than directed. Do not stop taking except on your doctor's advice. Talk to your pediatrician regarding the use of this medicine in children. Special care may be needed. Overdosage: If you think you have taken too much of this medicine contact a poison control center or emergency room at once. NOTE: This medicine is only for you. Do not share this medicine with others. What if I miss a dose? If you miss a dose, take it as soon as you can. If it is almost time for your next dose, take only that dose. Do not take double or extra doses. What may interact with this medicine? Do not take this medicine with any of the following medications: -certain medicines for fungal infections like ketoconazole and itraconazole -certain  antibiotics called macrolide antibiotics like clarithromycin and telithromycin -certain medicines for HIV disease called protease inhibitors like nelfinavir -nefazodone This medicine may also interact with the following medications: -amiodarone -beta-blockers like metoprolol and propranolol -calcium channel blockers like diltiazem and verapamil -certain medicines for seizures like barbiturates and phenytoin -digoxin -grapefruit juice -rifampicin -St. John's wort This list may not describe all possible interactions. Give your health care provider a list of all the medicines, herbs, non-prescription drugs, or dietary supplements you use. Also tell them if you smoke, drink alcohol, or use illegal drugs. Some items may interact with your medicine. What should I watch for while using this medicine? Tell your doctor or health care professional if your symptoms do not start to get better or if they get worse. Tell your doctor or health care professional right away if you have any changes in your eyesight. Do not become pregnant while taking this medicine. Women who are able to get pregnant must use birth control while taking this medicine. Women should inform their doctor if they wish to become pregnant or think they might be pregnant. There is a potential for serious side effects to an unborn child. Talk to your health care professional or pharmacist for more information. What side effects may I notice from receiving this medicine? Side effects that you should report to your doctor or health care professional as soon as possible: -allergic reactions like skin rash, itching or hives, swelling of the face, lips, or tongue -high blood pressure -signs and symptoms of a dangerous change in heartbeat or heart rhythm  like chest pain; dizziness; fast or irregular heartbeat; palpitations; feeling faint or lightheaded; breathing problems -unusually slow heartbeat Side effects that usually do not require  medical attention (Report these to your doctor or health care professional if they continue or are bothersome.): -changes in vision This list may not describe all possible side effects. Call your doctor for medical advice about side effects. You may report side effects to FDA at 1-800-FDA-1088. Where should I keep my medicine? Keep out of the reach of children. Store at room temperature between 20 and 25 degrees C (68 and 77 degrees F). Throw away any unused medicine after the expiration date. NOTE: This sheet is a summary. It may not cover all possible information. If you have questions about this medicine, talk to your doctor, pharmacist, or health care provider.    2016, Elsevier/Gold Standard. (2013-11-24 15:21:41) Echocardiogram An echocardiogram, or echocardiography, uses sound waves (ultrasound) to produce an image of your heart. The echocardiogram is simple, painless, obtained within a short period of time, and offers valuable information to your health care provider. The images from an echocardiogram can provide information such as:  Evidence of coronary artery disease (CAD).  Heart size.  Heart muscle function.  Heart valve function.  Aneurysm detection.  Evidence of a past heart attack.  Fluid buildup around the heart.  Heart muscle thickening.  Assess heart valve function. LET [facility] CARE PROVIDER KNOW ABOUT:  Any allergies you have.  All medicines you are taking, including vitamins, herbs, eye drops, creams, and over-the-counter medicines.  Previous problems you or members of your family have had with the use of anesthetics.  Any blood disorders you have.  Previous surgeries you have had.  Medical conditions you have.  Possibility of pregnancy, if this applies. BEFORE THE PROCEDURE  No special preparation is needed. Eat and drink normally.  PROCEDURE   In order to produce an image of your heart, gel will be applied to your chest and a wand-like tool  (transducer) will be moved over your chest. The gel will help transmit the sound waves from the transducer. The sound waves will harmlessly bounce off your heart to allow the heart images to be captured in real-time motion. These images will then be recorded.  You may need an IV to receive a medicine that improves the quality of the pictures. AFTER THE PROCEDURE You may return to your normal schedule including diet, activities, and medicines, unless your health care provider tells you otherwise.   This information is not intended to replace advice given to you by your health care provider. Make sure you discuss any questions you have with your health care provider.   Document Released: 07/19/2000 Document Revised: 08/12/2014 Document Reviewed: 03/29/2013 Elsevier Interactive Patient Education [employer].

## 2015-05-25 ENCOUNTER — Telehealth (HOSPITAL_COMMUNITY): Payer: Self-pay | Admitting: Pharmacist

## 2015-05-25 NOTE — Telephone Encounter (Signed)
Corlanor was approved by [facility] from 05/18/2015 through 08/04/2016 for $0 copay.   Ruta Hinds. Velva Harman, PharmD, BCPS, CPP Clinical Pharmacist Pager: [phone] Phone: [phone] 05/25/2015 3:08 PM

## 2015-06-01 ENCOUNTER — Telehealth (HOSPITAL_COMMUNITY): Payer: Self-pay

## 2015-06-01 MED ORDER — RAMIPRIL 10 MG PO CAPS: 10.0000 mg | ORAL_CAPSULE | Freq: Every day | ORAL | Status: DC

## 2015-06-01 NOTE — Telephone Encounter (Signed)
Patient calling for refill of Rampril,  Sent into [employer], #90 with 3 refills

## 2015-06-05 DIAGNOSIS — R531 Weakness: Secondary | ICD-10-CM | POA: Diagnosis not present

## 2015-06-05 DIAGNOSIS — E1140 Type 2 diabetes mellitus with diabetic neuropathy, unspecified: Secondary | ICD-10-CM | POA: Diagnosis not present

## 2015-06-05 DIAGNOSIS — Z6836 Body mass index (BMI) 36.0-36.9, adult: Secondary | ICD-10-CM | POA: Diagnosis not present

## 2015-06-06 ENCOUNTER — Encounter: Payer: Self-pay | Admitting: *Deleted

## 2015-06-15 DIAGNOSIS — I10 Essential (primary) hypertension: Secondary | ICD-10-CM | POA: Diagnosis not present

## 2015-06-15 DIAGNOSIS — E1140 Type 2 diabetes mellitus with diabetic neuropathy, unspecified: Secondary | ICD-10-CM | POA: Diagnosis not present

## 2015-06-15 DIAGNOSIS — Z1389 Encounter for screening for other disorder: Secondary | ICD-10-CM | POA: Diagnosis not present

## 2015-06-15 DIAGNOSIS — Z23 Encounter for immunization: Secondary | ICD-10-CM | POA: Diagnosis not present

## 2015-06-15 DIAGNOSIS — Z0000 Encounter for general adult medical examination without abnormal findings: Secondary | ICD-10-CM | POA: Diagnosis not present

## 2015-06-15 DIAGNOSIS — E785 Hyperlipidemia, unspecified: Secondary | ICD-10-CM | POA: Diagnosis not present

## 2015-06-19 ENCOUNTER — Telehealth: Payer: Self-pay | Admitting: Internal Medicine

## 2015-06-19 NOTE — Telephone Encounter (Signed)
°  1. Has your device fired? no ° °2. Is you device beeping? no ° °3. Are you experiencing draining or swelling at device site? no ° °4. Are you calling to see if we received your device transmission? yes ° °5. Have you passed out? no ° °

## 2015-06-20 NOTE — Telephone Encounter (Signed)
Spoke w/ pt and informed her that she received the letter because she was suppose to follow up w/ MD in August 2016. Pt agreed to an appt w/ MD on 08-16-15 at 3:45 PM.

## 2015-06-24 ENCOUNTER — Telehealth: Payer: Self-pay | Admitting: Physician Assistant

## 2015-06-24 NOTE — Telephone Encounter (Signed)
Patient called because she was having problems with being lightheaded. Her systolic blood pressure was approximately 70 this morning and she has taken all of her morning medications including carvedilol 18.75 mg, colon or 5 mg, spironolactone 25 mg, and Lasix 60 mg. She feels weak and dizzy. She does not know what her blood pressure is right at this time. She is with a friend.  A couple days ago, she was having heart fluttering most of the day but she is not having heart fluttering now. Her heart rate was elevated previously at a little over 100, but her heart rate is in the 80s today and she feels this is normal for her.  She also stated that her weight is down and she has been losing weight deliberately by eating better. This was partly to get her blood sugars under control, it is helping.  She has been having orthostatic dizziness for a few days.  Advised her to hold the Lasix this evening, and decrease the dose to 40 mg twice a day. She is to hold the Altase 10 mg which she takes at night and hold tomorrow night if her systolic blood pressures less than 80.  For now, we will continue her other medications at current doses. She is to continue to track her weight and let us know if her symptoms and blood pressure do not improve. She will stay with her friend tonight. She does not feel in danger of losing consciousness or falling.  She has an appointment this coming Wednesday with Dr. Haroldine Laws and she is encouraged to keep this.  Rosaria Ferries, PA-C 06/24/2015 11:41 AM Beeper [phone]

## 2015-06-28 ENCOUNTER — Ambulatory Visit (HOSPITAL_BASED_OUTPATIENT_CLINIC_OR_DEPARTMENT_OTHER)
Admission: RE | Admit: 2015-06-28 | Discharge: 2015-06-28 | Disposition: A | Discharge status: Home / Self Care | Payer: Medicare Other | Source: Ambulatory Visit | Attending: Internal Medicine | Admitting: Internal Medicine

## 2015-06-28 ENCOUNTER — Ambulatory Visit (HOSPITAL_COMMUNITY)
Admission: RE | Admit: 2015-06-28 | Discharge: 2015-06-28 | Disposition: A | Discharge status: Home / Self Care | Payer: Medicare Other | Source: Ambulatory Visit | Attending: Internal Medicine | Admitting: Internal Medicine

## 2015-06-28 ENCOUNTER — Encounter (HOSPITAL_COMMUNITY): Payer: Self-pay

## 2015-06-28 VITALS — BP 104/58 | HR 93 | Ht 65.0 in | Wt 226.8 lb

## 2015-06-28 VITALS — BP 123/69 | HR 88

## 2015-06-28 DIAGNOSIS — R002 Palpitations: Secondary | ICD-10-CM

## 2015-06-28 DIAGNOSIS — I340 Nonrheumatic mitral (valve) insufficiency: Secondary | ICD-10-CM | POA: Diagnosis not present

## 2015-06-28 DIAGNOSIS — I5189 Other ill-defined heart diseases: Secondary | ICD-10-CM | POA: Diagnosis not present

## 2015-06-28 DIAGNOSIS — I472 Ventricular tachycardia: Secondary | ICD-10-CM | POA: Diagnosis not present

## 2015-06-28 DIAGNOSIS — I517 Cardiomegaly: Secondary | ICD-10-CM | POA: Diagnosis not present

## 2015-06-28 DIAGNOSIS — I071 Rheumatic tricuspid insufficiency: Secondary | ICD-10-CM | POA: Diagnosis not present

## 2015-06-28 DIAGNOSIS — I5022 Chronic systolic (congestive) heart failure: Secondary | ICD-10-CM | POA: Diagnosis not present

## 2015-06-28 DIAGNOSIS — I5023 Acute on chronic systolic (congestive) heart failure: Secondary | ICD-10-CM

## 2015-06-28 DIAGNOSIS — I4729 Other ventricular tachycardia: Secondary | ICD-10-CM

## 2015-06-28 DIAGNOSIS — I272 Other secondary pulmonary hypertension: Secondary | ICD-10-CM | POA: Diagnosis not present

## 2015-06-28 MED ORDER — AMIODARONE HCL 200 MG PO TABS: 200.0000 mg | ORAL_TABLET | Freq: Two times a day (BID) | ORAL | Status: DC

## 2015-06-28 MED ORDER — PERFLUTREN LIPID MICROSPHERE
1.0000 mL | INTRAVENOUS | Status: AC | PRN
  Administered 2015-06-28: 4 mL via INTRAVENOUS

## 2015-06-28 MED ORDER — FUROSEMIDE 20 MG PO TABS: ORAL_TABLET | ORAL | Status: DC

## 2015-06-28 MED ORDER — PERFLUTREN LIPID MICROSPHERE
INTRAVENOUS | Status: AC
  Filled 2015-06-28: qty 10

## 2015-06-28 MED ORDER — DIGOXIN 125 MCG PO TABS: 0.1250 mg | ORAL_TABLET | ORAL | Status: DC

## 2015-06-28 NOTE — Patient Instructions (Addendum)
Change Digoxin to 0.125 mg every other day   Take Furosemide (Lasix) 60 mg (1 & 1/2 tabs) Twice daily FOR 2 DAYS ONLY, then 40 mg Twice daily   Start Amiodarone 200 mg Twice daily    Your physician recommends that you schedule a follow-up appointment in: 3 weeks

## 2015-06-28 NOTE — Progress Notes (Signed)
Patient ID: ABBAGALE ZEIDMAN, female   DOB: 09-25-1955, 59 y.o.   MRN: [id number]  Endocrinologist: Dr Loanne Drilling  Oncologist: Dr. Humphrey Rolls PCP: [name], NP ([place]) Primary HF MD: Dr Haroldine Laws  HPI Ms. Troeger is a 59  year old female with history of breast cancer, obesity, HTN, DM, IBS, HLD, OSA, chemo induced cardiomyopathy s/p ICD and chronic systolic HF.    She had chemo induced cardiomyopathy.  Her EF has fallen from 45% per Cardiac MRI in July 2010 to 20-25% in 10/2012. S/P single chamber ICD ([employer]) implantation on 11/10/12 by Dr. Rayann Heman.     Heart Failure Follow Up: Last visit she was started on corlanor. Overall feeling poorly. Increased fatigue. Mild dizziness standing.  Denies SOB/PND/Orthopnea.   Wearing CPAP at night.  Following a low salt diet and drinking less than 2L a day. Taking all medicaitons.   [place] on 01/30/12 showed decompensated HF with low cardiac output consistent with cardiogenic shock.  [place] 6/27  RA = 20  RV = 55/13/22  PA =56/34 (44)  PCW = 28  Fick cardiac output/index = 3.1/1.5  PVR = 5.3 [place]  FA sat = 91%  PA sat = 42%, 48%  SVC sat = 46%  Echo 08/12/11: Left ventricle: mildly dilated, EF 20% to 25%. Diffuse hypokinesis. Doppler parameters are consistent with restrictive physiology, indicative of decreased left ventricular diastolic compliance and/or increased left atrial pressure. Left atrium was moderately dilated. Right atrium mildly dilated. 04/09/12 ECHO EF 30-35% LV mildly dilated  Hyopkinesis 10/07/12 ECHO EF 20-25% RV ok. 03/31/14: ECHO EF 30-35%, severe HK inferior/inferoseptal walls, mild MR, LA mod dilated, RA mildly dilated 06/28/2015: ECHO EF 25% RV mildly dilated. Severe HK inferior wall.   Myoview in 05/2010 EF 59%. No ischemia.    ROS: All systems negative except as listed in HPI, PMH and Problem List.  Social History   Social History  . Marital Status: Divorced    Spouse Name: N/A  . Number of Children: N/A  . Years  of Education: N/A   Occupational History  . Disabled    Social History Main Topics  . Smoking status: Former Smoker    Quit date: 04/12/2001  . Smokeless tobacco: Never Used  . Alcohol Use: No  . Drug Use: No  . Sexual Activity: Not Currently   Other Topics Concern  . Not on file   Social History Narrative   Lives with mother and brother Philis Nettle         Family History  Problem Relation Age of Onset  . Diabetes Mother     lung cancer, CHF, DM2  . Coronary artery disease Father 4    HTN, CABG x 3, DM2, deceased    Past Medical History  Diagnosis Date  . 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 DYSLIPIDEMIA 05/01/2009  . OBESITY 07/24/2007  . OBSTRUCTIVE SLEEP APNEA 07/24/2007    with CPAP compliance  . RESTLESS LEG SYNDROME 07/24/2007  . SYSTOLIC HEART FAILURE, CHRONIC 01/05/2009    a. chemo induced cardiomyopathy b. cMRI (02/2009) EF 45% c. Myoview 05/2010: EF 59%, no ischemia d. RHC (01/2012) RA 20, RV 55/13/22, PA 56/34 (44), PCWP 28, Fick CO/CI: 3.1 / 1.5, PVR 5.3 WU, PA sat 42% & 48% e. ECHO (10/2012) EF 20-25%, diff HK, MV calcified annulus f. ECHO (03/2014) EF 30-35%, sev HK, inferior/inferoseptal walls, mild MR  . Palpitations 09/20/2010  . 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 Hypertension   . History of breast cancer 2006  with return in 2008, s/p mastectomy, XRT and chemotherapy  . Osteoarthritis   . Anxiety   . Depression     followed by a psychiatrist  . Asthma   . GERD (gastroesophageal reflux disease)   . Gastroparesis   . IBS (irritable bowel syndrome)   . Myocardial infarction ([place])   . ICD (implantable cardiac defibrillator) in place 11/10/2012    ICD (11/2012)  . COPD (chronic obstructive pulmonary disease) ([place])     Current Outpatient Prescriptions  Medication Sig Dispense Refill  . ACCU-CHEK FASTCLIX LANCETS MISC     . ACCU-CHEK SMARTVIEW test strip     . acetaminophen (TYLENOL ARTHRITIS PAIN) 650 MG CR tablet Take 1,300 mg by mouth every 8 (eight) hours as  needed. pain    . albuterol (PROVENTIL HFA) 108 (90 BASE) MCG/ACT inhaler Inhale 2 puffs into the lungs every 6 (six) hours as needed. For shortness of breath    . ALPRAZolam (XANAX) 0.25 MG tablet Take 0.25 mg by mouth as needed for anxiety.    Marland Kitchen atorvastatin (LIPITOR) 40 MG tablet Take 40 mg by mouth daily.      . baclofen (LIORESAL) 10 MG tablet Take 10 mg by mouth 2 (two) times daily as needed.    . busPIRone (BUSPAR) 30 MG tablet Take 30 mg by mouth 2 (two) times daily.     . carvedilol (COREG) 12.5 MG tablet Take 1.5 tablets (18.75 mg total) by mouth 2 (two) times daily with a meal. 180 tablet 3  . DULoxetine (CYMBALTA) 30 MG capsule Take 30 mg by mouth daily.    . furosemide (LASIX) 20 MG tablet TAKE 3 TABLETS BY MOUTH 2 TIMES DAILY (Patient taking differently: 40 mg twice a day) 540 tablet 3  . Glucosamine 500 MG CAPS Take 1 capsule by mouth daily.      . Insulin Pen Needle (B-D ULTRAFINE III SHORT PEN) 31G X 8 MM MISC 18/day as directed dx 250.00 500 each 11  . ivabradine (CORLANOR) 5 MG TABS tablet Take 0.5 tablets (2.5 mg total) by mouth 2 (two) times daily with a meal. 60 tablet 2  . levothyroxine (SYNTHROID, LEVOTHROID) 75 MCG tablet Take 75 mcg by mouth daily.      Marland Kitchen loperamide (IMODIUM A-D) 2 MG tablet Take 4 mg by mouth as needed for diarrhea or loose stools. diarrhea    . metolazone (ZAROXOLYN) 2.5 MG tablet Take 2.5 mg as needed for weight gain. 30 tablet 3  . mometasone (NASONEX) 50 MCG/ACT nasal spray Place 1 spray into both nostrils daily. allergies    . nitroGLYCERIN (NITROSTAT) 0.4 MG SL tablet Place 1 tablet (0.4 mg total) under the tongue every 5 (five) minutes as needed. For chest pain 25 tablet 3  . omega-3 acid ethyl esters (LOVAZA) 1 G capsule Take 2 g by mouth 2 (two) times daily.     . pantoprazole (PROTONIX) 40 MG tablet Take 40 mg by mouth 2 (two) times daily.      . potassium chloride SA (K-DUR,KLOR-CON) 20 MEQ tablet Take 3 tablets (60 mEq total) by mouth 2 (two)  times daily. 360 tablet 3  . ramipril (ALTACE) 10 MG capsule Take 1 capsule (10 mg total) by mouth daily. 90 capsule 3  . rOPINIRole (REQUIP) 1 MG tablet Take 1 mg by mouth at bedtime.      Marland Kitchen spironolactone (ALDACTONE) 25 MG tablet TAKE 1 TABLET BY MOUTH DAILY 90 tablet 3  . SUMAtriptan (IMITREX) 25 MG tablet Take  25 mg by mouth every 2 (two) hours as needed. For migraine    . tiotropium (SPIRIVA) 18 MCG inhalation capsule Place 18 mcg into inhaler and inhale daily.      . TRESIBA FLEXTOUCH 200 UNIT/ML SOPN Inject 150 Units into the skin every morning.     No current facility-administered medications for this encounter.   Facility-Administered Medications Ordered in Other Encounters  Medication Dose Route Frequency Provider Last Rate Last Dose  . perflutren lipid microspheres (DEFINITY) IV suspension  1-10 mL Intravenous PRN Larey Dresser, MD   4 mL at 06/28/15 1451   Filed Vitals:   06/28/15 1508  BP: 104/58  Pulse: 93  Height: 5\' 5"  (1.651 m)  Weight: 226 lb 12.8 oz (102.876 kg)  SpO2: 97%     PHYSICAL EXAM: General:  Obese, Well appearing. HEENT: normal except for poor dentition Neck: supple. JVP hard to see but dose not appear elevated, carotids 2+ bilaterally; no bruits. No lymphadenopathy or thryomegaly appreciated. Cor: PMI normal. Regular rate & rhythm. no S3. Lungs: clear Abdomen: soft, obese, nontender, no distention. No hepatosplenomegaly. No bruits or masses. Good bowel sounds. Extremities: no cyanosis, clubbing, rash, no edema Neuro: alert & orientedx3, cranial nerves grossly intact. Moves all 4 extremities w/o difficulty. Affect pleasant.   ASSESSMENT & PLAN:  1) Chronic systolic HF, NICM thought to be chemo induced, EF 30-35% (03/2014) s/p ICD - Todays ECHO EF ~25%. RV mildly dilated.  - Has NYHA III symptoms. Volume status stable but has orthostatic type symptoms. Not orthostatic here in clinic. Will continue lasix 40 mg  BID and discussed the use of sliding  scale diuretics. Continue spiro 25 mg daily.  - Cut back coreg 12.5 mg BID.  - Continue ramipril 10 mg qhs - may need to cut back  -Restart digoxin 0.125 mg QOD.  - Continue 2.5 mg corlanor twice daily. Cn titrate as tolerated at next visit.   - Reinforced the need and importance of daily weights, a low sodium diet, and fluid restriction (less than 2 L a day). Instructed to call the HF clinic if weight increases more than 3 lbs overnight or 5 lbs in a week.  2) HTN - Stable.  Continue current medications.Marland Kitchen  3) OSA - Compliant with CPAP nightly. 4) HLD - Followed by PCP.  5) Obesity - Discussed with her trying to get back on her diet and to watch her portion sizes.  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 Will continue statin, BB and ACE-I. No s/s of ischemia. 7) Palpitations- ST jude interrogation with episodes on NSVT. Will start amio 200 mg twice a day. Place 30-day event monitor to look for AF as well.  Follow up in 3 weeks. Plan to interrogate ICD at that time.   Amy Clegg NP-C 3:20 PM  Patient seen and examined with Darrick Grinder, NP. We discussed all aspects of the encounter. I agree with the assessment and plan as stated above.    Echo images reviewed personally. EF ~25%. NYHA III symptoms. Volume status ok but continues with orthostatic like symptoms. Not orthostatic here though. Will cut back carvedilol and may also need to adjust ramipril. Having episodes of NSVT of ICD. Will start amio. Will need CPX testing soon. Knows to contact us if getting worse.   Total time spent 45 minutes. Over half that time spent discussing above.   Ryheem Jay,MD 7:01 PM

## 2015-06-28 NOTE — Progress Notes (Signed)
  Echocardiogram 2D Echocardiogram has been performed.  Johny Chess 06/28/2015, 3:13 PM

## 2015-06-30 DIAGNOSIS — I472 Ventricular tachycardia: Secondary | ICD-10-CM | POA: Insufficient documentation

## 2015-06-30 DIAGNOSIS — I4729 Other ventricular tachycardia: Secondary | ICD-10-CM | POA: Insufficient documentation

## 2015-07-07 ENCOUNTER — Telehealth: Payer: Self-pay | Admitting: Cardiology

## 2015-07-07 NOTE — Telephone Encounter (Signed)
Spoke w/ pt brother and requested that he let pt know to send a remote transmission from home monitor b/c it has not updated in at least 8 days. Pt brother verbalized understanding.

## 2015-07-19 ENCOUNTER — Encounter (HOSPITAL_COMMUNITY): Payer: Self-pay

## 2015-07-19 ENCOUNTER — Telehealth: Payer: Self-pay | Admitting: Cardiology

## 2015-07-19 NOTE — Telephone Encounter (Signed)
Spoke w/ pt and requested that she send a manual transmission b/c her home monitor has not updated in at least 8 days.   

## 2015-07-20 DIAGNOSIS — Z6836 Body mass index (BMI) 36.0-36.9, adult: Secondary | ICD-10-CM | POA: Diagnosis not present

## 2015-07-20 DIAGNOSIS — J069 Acute upper respiratory infection, unspecified: Secondary | ICD-10-CM | POA: Diagnosis not present

## 2015-07-20 DIAGNOSIS — H60399 Other infective otitis externa, unspecified ear: Secondary | ICD-10-CM | POA: Diagnosis not present

## 2015-07-24 ENCOUNTER — Telehealth: Payer: Self-pay | Admitting: Nurse Practitioner

## 2015-07-24 NOTE — Telephone Encounter (Signed)
Patient called in and made her one year survivorship appt

## 2015-07-27 ENCOUNTER — Other Ambulatory Visit (HOSPITAL_COMMUNITY): Payer: Self-pay | Admitting: Cardiology

## 2015-07-27 ENCOUNTER — Encounter: Payer: Self-pay | Admitting: Internal Medicine

## 2015-07-27 DIAGNOSIS — I5022 Chronic systolic (congestive) heart failure: Secondary | ICD-10-CM

## 2015-07-27 MED ORDER — IVABRADINE HCL 5 MG PO TABS: 2.5000 mg | ORAL_TABLET | Freq: Two times a day (BID) | ORAL | Status: DC

## 2015-07-27 MED ORDER — AMIODARONE HCL 200 MG PO TABS
200.0000 mg | ORAL_TABLET | Freq: Two times a day (BID) | ORAL | Status: DC
Start: 2015-07-27 — End: 2015-08-08

## 2015-07-27 MED ORDER — METOLAZONE 2.5 MG PO TABS: ORAL_TABLET | ORAL | Status: DC

## 2015-08-04 DIAGNOSIS — H9212 Otorrhea, left ear: Secondary | ICD-10-CM | POA: Diagnosis not present

## 2015-08-04 DIAGNOSIS — J019 Acute sinusitis, unspecified: Secondary | ICD-10-CM | POA: Diagnosis not present

## 2015-08-06 HISTORY — PX: BREAST BIOPSY: SHX20

## 2015-08-08 ENCOUNTER — Telehealth: Payer: Self-pay | Admitting: Nurse Practitioner

## 2015-08-08 ENCOUNTER — Ambulatory Visit (HOSPITAL_COMMUNITY)
Admission: RE | Admit: 2015-08-08 | Discharge: 2015-08-08 | Disposition: A | Discharge status: Home / Self Care | Payer: Medicare Other | Source: Ambulatory Visit | Attending: Cardiology | Admitting: Cardiology

## 2015-08-08 ENCOUNTER — Ambulatory Visit (HOSPITAL_BASED_OUTPATIENT_CLINIC_OR_DEPARTMENT_OTHER): Payer: Medicare Other | Admitting: Nurse Practitioner

## 2015-08-08 ENCOUNTER — Encounter: Payer: Self-pay | Admitting: Nurse Practitioner

## 2015-08-08 VITALS — BP 114/58 | HR 99 | Wt 222.4 lb

## 2015-08-08 VITALS — BP 120/99 | HR 94 | Temp 97.5°F | Resp 18 | Ht 65.0 in | Wt 223.3 lb

## 2015-08-08 DIAGNOSIS — I2510 Atherosclerotic heart disease of native coronary artery without angina pectoris: Secondary | ICD-10-CM | POA: Insufficient documentation

## 2015-08-08 DIAGNOSIS — I10 Essential (primary) hypertension: Secondary | ICD-10-CM | POA: Diagnosis not present

## 2015-08-08 DIAGNOSIS — C50511 Malignant neoplasm of lower-outer quadrant of right female breast: Secondary | ICD-10-CM

## 2015-08-08 DIAGNOSIS — E785 Hyperlipidemia, unspecified: Secondary | ICD-10-CM | POA: Diagnosis not present

## 2015-08-08 DIAGNOSIS — I5023 Acute on chronic systolic (congestive) heart failure: Secondary | ICD-10-CM

## 2015-08-08 DIAGNOSIS — E669 Obesity, unspecified: Secondary | ICD-10-CM

## 2015-08-08 DIAGNOSIS — Z853 Personal history of malignant neoplasm of breast: Secondary | ICD-10-CM | POA: Diagnosis not present

## 2015-08-08 DIAGNOSIS — I110 Hypertensive heart disease with heart failure: Secondary | ICD-10-CM | POA: Diagnosis not present

## 2015-08-08 DIAGNOSIS — I472 Ventricular tachycardia: Secondary | ICD-10-CM | POA: Insufficient documentation

## 2015-08-08 DIAGNOSIS — G4733 Obstructive sleep apnea (adult) (pediatric): Secondary | ICD-10-CM | POA: Diagnosis not present

## 2015-08-08 DIAGNOSIS — I5022 Chronic systolic (congestive) heart failure: Secondary | ICD-10-CM | POA: Diagnosis not present

## 2015-08-08 DIAGNOSIS — I4729 Other ventricular tachycardia: Secondary | ICD-10-CM

## 2015-08-08 LAB — BASIC METABOLIC PANEL
Anion gap: 12 (ref 5–15)
BUN: 27 mg/dL — AB (ref 6–20)
CALCIUM: 9.6 mg/dL (ref 8.9–10.3)
CO2: 28 mmol/L (ref 22–32)
CREATININE: 0.98 mg/dL (ref 0.44–1.00)
Chloride: 100 mmol/L — ABNORMAL LOW (ref 101–111)
GFR calc Af Amer: >60 mL/min (ref >60)
GLUCOSE: 215 mg/dL — AB (ref 65–99)
Potassium: 4.6 mmol/L (ref 3.5–5.1)
Sodium: 140 mmol/L (ref 135–145)

## 2015-08-08 LAB — DIGOXIN LEVEL: Digoxin Level: 0.7 ng/mL — ABNORMAL LOW (ref 0.8–2.0)

## 2015-08-08 LAB — TSH: TSH: 2.493 u[IU]/mL (ref 0.350–4.500)

## 2015-08-08 MED ORDER — IVABRADINE HCL 5 MG PO TABS: 5.0000 mg | ORAL_TABLET | Freq: Two times a day (BID) | ORAL | Status: DC

## 2015-08-08 MED ORDER — AMIODARONE HCL 200 MG PO TABS: 200.0000 mg | ORAL_TABLET | Freq: Every day | ORAL | Status: DC

## 2015-08-08 NOTE — Progress Notes (Signed)
Advanced Heart Failure Medication Review by a Pharmacist  Does the patient  feel that his/her medications are working for him/her?  yes  Has the patient been experiencing any side effects to the medications prescribed?  no  Does the patient measure his/her own blood pressure or blood glucose at home?  yes   Does the patient have any problems obtaining medications due to transportation or finances?   no  Understanding of regimen: good Understanding of indications: good Potential of compliance: good Patient understands to avoid NSAIDs. Patient understands to avoid decongestants.  Issues to address at subsequent visits: None   Pharmacist comments:  [name] is a pleasant 60 yo F presenting with a current medication list. She reports good compliance with her medications and did not have any specific medication-related questions or concerns for me at this time.   [name]. Velva Harman, PharmD, BCPS, CPP Clinical Pharmacist Pager: [phone] Phone: [phone] 08/08/2015 10:38 AM      Time with patient: 8 minutes Preparation and documentation time: 2 minutes Total time: 10 minutes

## 2015-08-08 NOTE — Patient Instructions (Signed)
DECREASE Amiodarone to 200 mg ,one tab daily INCREASE Corlanor to 5 mg, one tab twice a day  Labs today  Your physician has recommended that you have a cardiopulmonary stress test (CPX). CPX testing is a non-invasive measurement of heart and lung function. It replaces a traditional treadmill stress test. This type of test provides a tremendous amount of information that relates not only to your present condition but also for future outcomes. This test combines measurements of you ventilation, respiratory gas exchange in the lungs, electrocardiogram (EKG), blood pressure and physical response before, during, and following an exercise protocol.  Your physician recommends that you schedule a follow-up appointment in: 6 weeks in NP clinic  Do the following things EVERYDAY: 1) Weigh yourself in the morning before breakfast. Write it down and keep it in a log. 2) Take your medicines as prescribed 3) Eat low salt foods-Limit salt (sodium) to 2000 mg per day.  4) Stay as active as you can everyday 5) Limit all fluids for the day to less than 2 liters 6)

## 2015-08-08 NOTE — Progress Notes (Signed)
CLINIC:  Cancer Survivorship   REASON FOR VISIT:  Routine follow-up post-treatment for history of breast cancer.  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   10/2004 - 11/2004 Radiation Therapy Adjuvant RT ([place])   08/12/2006 Relapse/Recurrence Relapsed disease in the right breast   08/27/2006 Surgery Right breast mastectomy: Tumor size 0.4 cm margins negative, ER 0%, PR 0%, HER-2 negative, Ki-67 13%   08/27/2006 Pathologic Stage Stage IA: pT1a pNx    INTERVAL HISTORY:  [name] presents to the [facility] today for ongoing follow up regarding her history of breast cancer. Overall, [name] reports feeling doing well since her last visit from a breast cancer standpoint.  She has not yet obtained her six month follow up mammogram that was recommended on the study from June 2016. She has not noticed any change within her left breast or along her mastectomy incision on the right. She denies any headache, cough, shortness of breath, or uncontrolled pain.  She does have pain in her left knee and lower back which is unchanged. She reports a good appetite and denies any weight loss.  She saw Joanne Peace NP for her chemo induced CHF prior to this afternoon's appointment who felt that she was doing well overall.  They have adjusted her heart meds slightly, but [name] reports that she is feeling much better on the amiodarone and ivabradine than she has in the last several months. Her most recent echo was in 06/2015 showing an EF of about 20%. She will be undergoing stress test in the next few weeks and follow back up in the CHF clinic in 6 weeks. She states that she has an appointment early next week with  Dr. Rayann Heman to check her defibrillator.    REVIEW OF SYSTEMS:  General: Improving fatigue, as above. Occasional chills.  Denies fever, unintentional weight loss, or night sweats.   HEENT: Wears glasses and hearing aid. Recurrent ear infection for which she sees ENT next month. Ongoing sinus problems.  Otherwise, denies visual changes, hearing loss, mouth sores, or difficulty swallowing. Cardiac: Lower extremity swelling on occasion.  Denies palpitations or chest pain.  Respiratory: Denies wheeze or dyspnea on exertion.  Breast: Denies any new nodularity, masses, tenderness, nipple changes, or nipple discharge.  GI: Bilateral abdominal hernias.  Heartburn. Otherwise, denies abdominal pain, constipation, diarrhea, nausea, or vomiting.  GU: Denies dysuria, hematuria, vaginal bleeding, vaginal discharge, or vaginal dryness.  Musculoskeletal: Knee and back pain as above.  Neuro: Neuropathy in her bilateral feet. Skin: Denies rash, pruritis, or open wounds.  Psych: Occasional depression and anxiety, although reports it is well controlled. Occasional forgetfulness.  A 14-point review of systems was completed and was negative, except as noted above.     PAST MEDICAL/SURGICAL HISTORY:  Past Medical History  Diagnosis Date  . 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 DYSLIPIDEMIA 05/01/2009  . OBESITY 07/24/2007  . OBSTRUCTIVE SLEEP APNEA 07/24/2007    with CPAP compliance  . RESTLESS LEG SYNDROME 07/24/2007  . SYSTOLIC HEART FAILURE, CHRONIC 01/05/2009    a. chemo induced cardiomyopathy b. cMRI (02/2009) EF 45% c. Myoview 05/2010: EF 59%, no ischemia d. RHC (01/2012) RA 20, RV 55/13/22, PA 56/34 (44), PCWP 28, Fick CO/CI: 3.1 / 1.5,  PVR 5.3 WU, PA sat 42% & 48% e. ECHO (10/2012) EF 20-25%, diff HK, MV calcified annulus f. ECHO (03/2014) EF 30-35%, sev HK, inferior/inferoseptal walls, mild MR  . Palpitations 09/20/2010  . 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 Hypertension   . History of breast cancer 2006    with return in  2008, s/p mastectomy, XRT and chemotherapy  . Osteoarthritis   . Anxiety   . Depression     followed by a psychiatrist  . Asthma   . GERD (gastroesophageal reflux disease)   . Gastroparesis   . IBS (irritable bowel syndrome)   . Myocardial infarction ([place])   . ICD (implantable cardiac defibrillator) in place 11/10/2012    ICD (11/2012)  . COPD (chronic obstructive pulmonary disease) [facility])    Past Surgical History  Procedure Laterality Date  . Mastectomy      right  . Abdominal hysterectomy  1991  . Cardiac defibrillator placement  11/10/2012    SJM Fortify Assura VR implanted by Dr Rayann Heman for primary prevention  . Cholecystectomy    . Wrist surgery Bilateral   . Knee cartilage surgery    . Bi-ventricular implantable cardioverter defibrillator N/A 11/10/2012    Procedure: BI-VENTRICULAR IMPLANTABLE CARDIOVERTER DEFIBRILLATOR  (CRT-D);  Surgeon: Thompson Grayer, MD;  Location: [facility] CATH LAB;  Service: Cardiovascular;  Laterality: N/A;     ALLERGIES:  Allergies  Allergen Reactions  . Bee Venom Anaphylaxis  . Aspirin Hives  . Ciprofloxacin Hives  . Codeine Nausea Only    "can take ONLY if she eats with med"  . Naproxen Hives  . Sulfonamide Derivatives Hives  . Nsaids Itching and Rash  . Tape Rash    Also allergic to metal     CURRENT MEDICATIONS:  Current Outpatient Prescriptions on File Prior to Visit  Medication Sig Dispense Refill  . ACCU-CHEK FASTCLIX LANCETS MISC     . ACCU-CHEK SMARTVIEW test strip     . acetaminophen (TYLENOL ARTHRITIS PAIN) 650 MG CR tablet Take 1,300 mg by mouth every 8 (eight) hours as needed. pain    . albuterol (PROVENTIL HFA) 108 (90 BASE) MCG/ACT inhaler Inhale 2 puffs into the lungs every 6 (six) hours as needed. For shortness of breath    . ALPRAZolam (XANAX) 0.25 MG tablet Take 0.25 mg by mouth as needed for anxiety.    Marland Kitchen amiodarone (PACERONE) 200 MG tablet Take 1 tablet (200 mg total) by mouth daily. 30 tablet 3  . atorvastatin (LIPITOR) 40  MG tablet Take 40 mg by mouth daily.      . baclofen (LIORESAL) 10 MG tablet Take 10 mg by mouth 2 (two) times daily as needed for muscle spasms.     . busPIRone (BUSPAR) 30 MG tablet Take 30 mg by mouth 2 (two) times daily.     . carvedilol (COREG) 12.5 MG tablet Take 12.5 mg by mouth 2 (two) times daily with a meal.    . clindamycin (CLEOCIN) 300 MG capsule Take 300 mg by mouth 3 (three) times daily.    . digoxin (LANOXIN) 0.125 MG tablet Take 1 tablet (0.125 mg total) by mouth every other day. 90 tablet 3  . DULoxetine (CYMBALTA) 30 MG capsule Take 30 mg by mouth daily.    . furosemide (LASIX) 20 MG tablet 40 mg twice a day 540 tablet 3  . Glucosamine 500 MG CAPS Take 1 capsule by mouth daily.      . insulin lispro (HUMALOG) 100 UNIT/ML injection Inject 20 Units into the  skin daily with supper.    . Insulin Pen Needle (B-D ULTRAFINE III SHORT PEN) 31G X 8 MM MISC 18/day as directed dx 250.00 500 each 11  . ivabradine (CORLANOR) 5 MG TABS tablet Take 1 tablet (5 mg total) by mouth 2 (two) times daily with a meal. 60 tablet 3  . levothyroxine (SYNTHROID, LEVOTHROID) 75 MCG tablet Take 75 mcg by mouth daily.      Marland Kitchen loperamide (IMODIUM A-D) 2 MG tablet Take 4 mg by mouth as needed for diarrhea or loose stools. diarrhea    . metolazone (ZAROXOLYN) 2.5 MG tablet Take 2.5 mg as needed for weight gain. 30 tablet 3  . mometasone (NASONEX) 50 MCG/ACT nasal spray Place 1 spray into both nostrils daily. allergies    . nitroGLYCERIN (NITROSTAT) 0.4 MG SL tablet Place 1 tablet (0.4 mg total) under the tongue every 5 (five) minutes as needed. For chest pain (Patient not taking: Reported on 08/08/2015) 25 tablet 3  . omega-3 acid ethyl esters (LOVAZA) 1 G capsule Take 2 g by mouth 2 (two) times daily.     . pantoprazole (PROTONIX) 40 MG tablet Take 40 mg by mouth 2 (two) times daily.      . potassium chloride SA (K-DUR,KLOR-CON) 20 MEQ tablet Take 3 tablets (60 mEq total) by mouth 2 (two) times daily. 360 tablet  3  . ramipril (ALTACE) 10 MG capsule Take 1 capsule (10 mg total) by mouth daily. 90 capsule 3  . rOPINIRole (REQUIP) 1 MG tablet Take 1 mg by mouth at bedtime.      Marland Kitchen spironolactone (ALDACTONE) 25 MG tablet TAKE 1 TABLET BY MOUTH DAILY 90 tablet 3  . SUMAtriptan (IMITREX) 25 MG tablet Take 25 mg by mouth every 2 (two) hours as needed. For migraine    . tiotropium (SPIRIVA) 18 MCG inhalation capsule Place 18 mcg into inhaler and inhale daily.      . TRESIBA FLEXTOUCH 200 UNIT/ML SOPN Inject 160 Units into the skin every morning.      No current facility-administered medications on file prior to visit.     ONCOLOGIC FAMILY HISTORY:  Family History  Problem Relation Age of Onset  . Diabetes Mother     lung cancer, CHF, DM2  . Coronary artery disease Father 67    HTN, CABG x 3, DM2, deceased  . Cancer Cousin     Breast cancer   . Cancer Cousin     Breast cancer  . Cancer Cousin     Uterine cancer     SOCIAL HISTORY:  NAREH MATZKE is divorced and lives with her brother [place]  She has no children. [name] is currently disabled.  She is a former smoker, but denies any recent use.  She denies any current or history of alcohol, or illicit drug use.     PHYSICAL EXAMINATION:  Vital Signs: Filed Vitals:   08/08/15 1431  BP: 120/99  Pulse: 94  Temp: 97.5 F (36.4 C)  Resp: 18   ECOG performance status: 1 General: Well-nourished, well-appearing female in no acute distress.  She is unaccompanied in clinic today.   HEENT: Head is atraumatic and normocephalic.  Pupils equal and reactive to light and accomodation. Conjunctivae clear without exudate.  Sclerae anicteric. Oral mucosa is pink, moist, and intact without lesions.  Oropharynx is pink without lesions or erythema.  Lymph: No cervical, supraclavicular, infraclavicular, or axillary lymphadenopathy noted on palpation.  Breast: Bilateral breast exam performed. Right mastectomy  incision intact  without nodularity.  Left breast with lumpectomy scar.  No nodularity about scar or in remainder of breast.  GI: Abdomen obese, soft and round. No tenderness to palpation. Bilateral abdominal hernias. No hepatosplenomegaly.   GU: Deferred.  Musculoskeletal: Muscle strength 5/5 in all extremities.   Neuro: No focal deficits. Steady gait.  Psych: Mood and affect normal and appropriate for situation.  Extremities: No edema, cyanosis, or clubbing.  Skin: Warm and dry. No open lesions noted.     ASSESSMENT AND PLAN:   1. 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  [name] is now 9 years out from her recurrent diagnosis of breast cancer and is doing well with no clinical symptoms worrisome for cancer recurrence at this time. Her last mammogram performed in 01/2015 revealed coarse calcifications felt to be likely benign with recommendations made for repeat imaging in 6 months (December 2016).  As she is overdue for this study, we will schedule that for her following today's appointment.  Provided it is stable, she will return in 14 months time for evaluation in the Survivorship clinic (attempting to stagger visits with her PCP who performs her complete physical examination with breast exam in December each year). I have reviewed the recommendations for ongoing surveillance with her. She was instructed to notify me if she notes any change within her breast, any new symptoms such as pain, shortness of breath, weight loss, or fatigue.  She will continue to follow up with her PCP and cardiologists for care  of her other health problems, including her CHF which appears to be better controlled on her current regimen.  2. Cancer screening:  Due to [name]'s history and her age, she should receive screening for skin cancers, colon cancer, and gynecologic cancers. The information and recommendations were shared with the patient and in her written after visit summary.  3 Health maintenance and wellness promotion:[name] was encouraged to consume 5-7 servings of fruits and vegetables per day. We discussed recommendations to maximize nutrition and minimize recurrence, such as increased intake of fruits, vegetables, lean proteins, and minimizing the intake of red meats and processed foods.  She was also encouraged to engage in moderate to vigorous exercise for 30 minutes per day most days of the week, recognizing that she will need to build up slowly to this level based on her heart disease. She was instructed to limit her alcohol consumption and continue to abstain from tobacco use.      A total of 35 minutes of face-to-face time was spent with this patient with greater than 50% of that time in counseling and care-coordination.   Sylvan Cheese, NP  Survivorship Program [facility] [phone]   Note: PRIMARY CARE PROVIDER Nicoletta Dress, [place] [phone]

## 2015-08-08 NOTE — Patient Instructions (Addendum)
Thank you for coming in today!  Looking forward to working with you!  As we discussed, we need to get your six month follow up left sided mammogram and provided it is okay, we will see you back in March 2018.  Please stop by scheduling so that they can make those appointments for you. Please let me know if you develop any new symptoms or change in your health.  Symptoms to monitor for include the following:  Symptoms to Watch for and Report to Your Provider  . Return of the cancer symptoms you had before - such as a lump or change in the area of your incision (or changes in reconstructed breast, if reconstruction completed) . New or unusual pain that seems unrelated to an injury and does not go away, including back pain or bone pain . Weight loss without trying/intending . Unexplained bleeding . A rash or allergic reaction, such as swelling, severe itching or wheezing . Chills or fevers . Persistent headaches . Shortness of breath or difficulty breathing . Bloody stools or blood in your urine . Nausea, vomiting, diarrhea, loss of appetite, or trouble swallowing . A cough that does not go away . Abdominal pain . Swelling in your arms or legs . Fractures . Hot flashes or other menopausal symptoms . Any other signs mentioned by your doctor or nurse or any unusual symptoms                 that you just can't explain   NOTE: Just because you have certain symptoms, it doesn't mean the cancer has come back or you have a new cancer. Symptoms can be due to other problems that need to be addressed.  It is important to watch for these symptoms and report them to your provider so you can be medically evaluated for any of these concerns!    Living a Life of Wellness After Cancer:  *Note: Please consult your health care provider before using any medications, supplements, over-the-counter products, or other interventions.  Also, please consult your primary care provider before you begin any lifestyle  program (diet, exercise, etc.).  Your safety is our top priority and we want to make sure you continue to live a long and healthy life!    Healthy Lifestyle Recommendations  As a cancer survivor, it is important develop a lifelong commitment to a healthy lifestyle. A healthy lifestyle can prevent cancer from returning as well as prevent other diseases like heart disease, diabetes and high blood pressure.  These are some things that you can do to have a healthy lifestyle:  Marland Kitchen Maintain a healthy weight.  . Exercise daily per your doctor's orders. . Eat a balanced diet high in fruits, vegetables, bran, and fiber. Limit intake of red meat      and processed foods.  . Limit how much alcohol you consume, if at all. Ali Lowe regular bone mineral density testing for osteoporosis.  . Talk to your doctor about cardiovascular disease or "heart disease" screening. . Stop smoking (if you smoke). . Know your family history. . Be mindful of your emotional, social, and spiritual needs. . Meet regularly with a Primary Care Provider (PCP). Find a PCP if you do not             already have one. . Talk to your doctor about regular cancer screening including screening for colon           cancer, GYN cancers, and skin cancer.

## 2015-08-08 NOTE — Telephone Encounter (Signed)
Gave patient avs report and appointment for March 2018. Patient also given mammo appointment for 08/15/15 @ 7:45 am at [facility].

## 2015-08-08 NOTE — Progress Notes (Signed)
Patient ID: [name], female   DOB: May 19, 1956, 60 y.o.   MRN: [id number]  Endocrinologist: Dr Loanne Drilling  Oncologist: Dr. Humphrey Rolls PCP: [name], NP ([place]) Primary HF MD: Dr Haroldine Laws  HPI Ms. Mormando is a 60  year old female with history of breast cancer, obesity, HTN, DM, IBS, HLD, OSA, chemo induced cardiomyopathy s/p ICD and chronic systolic HF.    She had chemo induced cardiomyopathy.  Her EF has fallen from 45% per Cardiac MRI in July 2010 to 20-25% in 10/2012. S/P single chamber ICD (St Jude) implantation on 11/10/12 by Dr. Rayann Heman.     Heart Failure Follow Up: Last visit she complained of palpitations and increased fatigue. Note to have NSVT so amio was added and carvedilol was cut back to 12.5 mg twice a day. Now she says  palpitations are rare. Overall feeling ok. Mild dyspnea with exertion. Denies PND/orthopnea. Uses CPAP nightly. Weight at home 220-225 pounds. Since Christmas she has had extra lasix 4 days and one metolazone. Says her diet was high salt over the holiday. Says she uses an Web designer in the grocery store. Taking all medications.    [place] on 01/30/12 showed decompensated HF with low cardiac output consistent with cardiogenic shock.  [place] 6/27  RA = 20  RV = 55/13/22  PA =56/34 (44)  PCW = 28  Fick cardiac output/index = 3.1/1.5  PVR = 5.3 [place]  FA sat = 91%  PA sat = 42%, 48%  SVC sat = 46%  Echo 08/12/11: Left ventricle: mildly dilated, EF 20% to 25%. Diffuse hypokinesis. Doppler parameters are consistent with restrictive physiology, indicative of decreased left ventricular diastolic compliance and/or increased left atrial pressure. Left atrium was moderately dilated. Right atrium mildly dilated. 04/09/12 ECHO EF 30-35% LV mildly dilated  Hyopkinesis 10/07/12 ECHO EF 20-25% RV ok. 03/31/14: ECHO EF 30-35%, severe HK inferior/inferoseptal walls, mild MR, LA mod dilated, RA mildly dilated 06/28/2015: ECHO EF 25% RV mildly dilated. Severe HK  inferior wall.   Myoview in 05/2010 EF 59%. No ischemia.    ROS: All systems negative except as listed in HPI, PMH and Problem List.  Social History   Social History  . Marital Status: Divorced    Spouse Name: N/A  . Number of Children: N/A  . Years of Education: N/A   Occupational History  . Disabled    Social History Main Topics  . Smoking status: Former Smoker    Quit date: 04/12/2001  . Smokeless tobacco: Never Used  . Alcohol Use: No  . Drug Use: No  . Sexual Activity: Not Currently   Other Topics Concern  . Not on file   Social History Narrative   Lives with mother and brother Philis Nettle         Family History  Problem Relation Age of Onset  . Diabetes Mother     lung cancer, CHF, DM2  . Coronary artery disease Father 72    HTN, CABG x 3, DM2, deceased    Past Medical History  Diagnosis Date  . 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 DYSLIPIDEMIA 05/01/2009  . OBESITY 07/24/2007  . OBSTRUCTIVE SLEEP APNEA 07/24/2007    with CPAP compliance  . RESTLESS LEG SYNDROME 07/24/2007  . SYSTOLIC HEART FAILURE, CHRONIC 01/05/2009    a. chemo induced cardiomyopathy b. cMRI (02/2009) EF 45% c. Myoview 05/2010: EF 59%, no ischemia d. RHC (01/2012) RA 20, RV 55/13/22, PA 56/34 (44), PCWP 28, Fick CO/CI: 3.1 / 1.5, PVR 5.3 WU,  PA sat 42% & 48% e. ECHO (10/2012) EF 20-25%, diff HK, MV calcified annulus f. ECHO (03/2014) EF 30-35%, sev HK, inferior/inferoseptal walls, mild MR  . Palpitations 09/20/2010  . 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 Hypertension   . History of breast cancer 2006    with return in 2008, s/p mastectomy, XRT and chemotherapy  . Osteoarthritis   . Anxiety   . Depression     followed by a psychiatrist  . Asthma   . GERD (gastroesophageal reflux disease)   . Gastroparesis   . IBS (irritable bowel syndrome)   . Myocardial infarction ([place])   . ICD (implantable cardiac defibrillator) in place 11/10/2012    ICD (11/2012)  . COPD (chronic obstructive pulmonary disease)  ([place])     Current Outpatient Prescriptions  Medication Sig Dispense Refill  . ACCU-CHEK FASTCLIX LANCETS MISC     . ACCU-CHEK SMARTVIEW test strip     . acetaminophen (TYLENOL ARTHRITIS PAIN) 650 MG CR tablet Take 1,300 mg by mouth every 8 (eight) hours as needed. pain    . albuterol (PROVENTIL HFA) 108 (90 BASE) MCG/ACT inhaler Inhale 2 puffs into the lungs every 6 (six) hours as needed. For shortness of breath    . ALPRAZolam (XANAX) 0.25 MG tablet Take 0.25 mg by mouth as needed for anxiety.    Marland Kitchen amiodarone (PACERONE) 200 MG tablet Take 1 tablet (200 mg total) by mouth 2 (two) times daily. 180 tablet 3  . atorvastatin (LIPITOR) 40 MG tablet Take 40 mg by mouth daily.      . baclofen (LIORESAL) 10 MG tablet Take 10 mg by mouth 2 (two) times daily as needed.    . busPIRone (BUSPAR) 30 MG tablet Take 30 mg by mouth 2 (two) times daily.     . carvedilol (COREG) 12.5 MG tablet Take 1.5 tablets (18.75 mg total) by mouth 2 (two) times daily with a meal. 180 tablet 3  . digoxin (LANOXIN) 0.125 MG tablet Take 1 tablet (0.125 mg total) by mouth every other day. 90 tablet 3  . DULoxetine (CYMBALTA) 30 MG capsule Take 30 mg by mouth daily.    . furosemide (LASIX) 20 MG tablet 40 mg twice a day 540 tablet 3  . Glucosamine 500 MG CAPS Take 1 capsule by mouth daily.      . Insulin Pen Needle (B-D ULTRAFINE III SHORT PEN) 31G X 8 MM MISC 18/day as directed dx 250.00 500 each 11  . ivabradine (CORLANOR) 5 MG TABS tablet Take 0.5 tablets (2.5 mg total) by mouth 2 (two) times daily with a meal. 180 tablet 2  . levothyroxine (SYNTHROID, LEVOTHROID) 75 MCG tablet Take 75 mcg by mouth daily.      Marland Kitchen loperamide (IMODIUM A-D) 2 MG tablet Take 4 mg by mouth as needed for diarrhea or loose stools. diarrhea    . metolazone (ZAROXOLYN) 2.5 MG tablet Take 2.5 mg as needed for weight gain. 30 tablet 3  . mometasone (NASONEX) 50 MCG/ACT nasal spray Place 1 spray into both nostrils daily. allergies    . nitroGLYCERIN  (NITROSTAT) 0.4 MG SL tablet Place 1 tablet (0.4 mg total) under the tongue every 5 (five) minutes as needed. For chest pain 25 tablet 3  . omega-3 acid ethyl esters (LOVAZA) 1 G capsule Take 2 g by mouth 2 (two) times daily.     . pantoprazole (PROTONIX) 40 MG tablet Take 40 mg by mouth 2 (two) times daily.      . potassium chloride SA (  K-DUR,KLOR-CON) 20 MEQ tablet Take 3 tablets (60 mEq total) by mouth 2 (two) times daily. 360 tablet 3  . ramipril (ALTACE) 10 MG capsule Take 1 capsule (10 mg total) by mouth daily. 90 capsule 3  . rOPINIRole (REQUIP) 1 MG tablet Take 1 mg by mouth at bedtime.      Marland Kitchen spironolactone (ALDACTONE) 25 MG tablet TAKE 1 TABLET BY MOUTH DAILY 90 tablet 3  . SUMAtriptan (IMITREX) 25 MG tablet Take 25 mg by mouth every 2 (two) hours as needed. For migraine    . tiotropium (SPIRIVA) 18 MCG inhalation capsule Place 18 mcg into inhaler and inhale daily.      . TRESIBA FLEXTOUCH 200 UNIT/ML SOPN Inject 150 Units into the skin every morning.     No current facility-administered medications for this encounter.   Filed Vitals:   08/08/15 1027  BP: 114/58  Pulse: 99  Weight: 222 lb 6.4 oz (100.88 kg)  SpO2: 92%     PHYSICAL EXAM: General:  Obese, Well appearing. HEENT: normal  Neck: supple. JVP hard to see but dose not appear elevated, carotids 2+ bilaterally; no bruits. No lymphadenopathy or thryomegaly appreciated. Cor: PMI normal. Regular rate & rhythm. no S3. Lungs: clear Abdomen: soft, obese, nontender, no distention. No hepatosplenomegaly. No bruits or masses. Good bowel sounds. Extremities: no cyanosis, clubbing, rash, no edema Neuro: alert & orientedx3, cranial nerves grossly intact. Moves all 4 extremities w/o difficulty. Affect pleasant.   ASSESSMENT & PLAN:  1) Chronic systolic HF, NICM thought to be chemo induced, s/p ST Jude  ICD. Most Recent ECHO 06/28/2015  EF ~20%. RV mildly dilated.  - Has NYHA III symptoms.  Will continue lasix 40 mg  BID and  discussed the use of sliding scale diuretics. Continue spiro 25 mg daily.  - Continue coreg 12.5 mg BID.  - Continue ramipril 10 mg qhs - may need to cut back  -Continue digoxin 0.125 mg QOD.  - Increase corlanor to 5 mg twice a day.   -Check CPX to determine if limitations are related to obesity versus HF versus pulmonary.   - Reinforced the need and importance of daily weights, a low sodium diet, and fluid restriction (less than 2 L a day). Instructed to call the HF clinic if weight increases more than 3 lbs overnight or 5 lbs in a week.  2) HTN - Stable.  Continue current medications.Marland Kitchen  3) OSA - Compliant with CPAP nightly. 4) HLD - Followed by PCP.  5) Obesity - Discussed with her trying to get back on her diet and to watch her portion sizes.  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 Will continue statin, BB and ACE-I. No s/s of ischemia. 7) NSVT- Improved after amio added. Cut back amio to 200 mg daily.  Check TSH today. Needs yearly eye exam. She has follow up next month.    Check dig level, TSH, BMET today.  Follow up in 6 weeks.   Belvia Gotschall NP-C 10:34 AM

## 2015-08-09 ENCOUNTER — Telehealth: Payer: Self-pay | Admitting: Cardiology

## 2015-08-09 DIAGNOSIS — H60509 Unspecified acute noninfective otitis externa, unspecified ear: Secondary | ICD-10-CM | POA: Diagnosis not present

## 2015-08-09 DIAGNOSIS — J329 Chronic sinusitis, unspecified: Secondary | ICD-10-CM | POA: Diagnosis not present

## 2015-08-09 NOTE — Telephone Encounter (Signed)
LMOVM for pt to send remote transmission b/c the monitor has not updated in at least 8 days.

## 2015-08-15 ENCOUNTER — Encounter (HOSPITAL_COMMUNITY): Payer: Self-pay

## 2015-08-16 ENCOUNTER — Ambulatory Visit: Payer: Medicare Other | Admitting: Internal Medicine

## 2015-08-17 DIAGNOSIS — R921 Mammographic calcification found on diagnostic imaging of breast: Secondary | ICD-10-CM | POA: Diagnosis not present

## 2015-08-19 ENCOUNTER — Encounter (HOSPITAL_COMMUNITY): Payer: Self-pay | Admitting: Emergency Medicine

## 2015-08-19 ENCOUNTER — Emergency Department (HOSPITAL_COMMUNITY): Payer: Medicare Other

## 2015-08-19 ENCOUNTER — Inpatient Hospital Stay (HOSPITAL_COMMUNITY)
Admission: EM | Admit: 2015-08-19 | Discharge: 2015-08-22 | DRG: 287 | Disposition: A | Discharge status: Home / Self Care | Payer: Medicare Other | Attending: Internal Medicine | Admitting: Internal Medicine

## 2015-08-19 DIAGNOSIS — J45909 Unspecified asthma, uncomplicated: Secondary | ICD-10-CM | POA: Diagnosis present

## 2015-08-19 DIAGNOSIS — Z8049 Family history of malignant neoplasm of other genital organs: Secondary | ICD-10-CM

## 2015-08-19 DIAGNOSIS — Z8249 Family history of ischemic heart disease and other diseases of the circulatory system: Secondary | ICD-10-CM

## 2015-08-19 DIAGNOSIS — F329 Major depressive disorder, single episode, unspecified: Secondary | ICD-10-CM | POA: Diagnosis present

## 2015-08-19 DIAGNOSIS — Z87891 Personal history of nicotine dependence: Secondary | ICD-10-CM

## 2015-08-19 DIAGNOSIS — Z833 Family history of diabetes mellitus: Secondary | ICD-10-CM

## 2015-08-19 DIAGNOSIS — I5022 Chronic systolic (congestive) heart failure: Secondary | ICD-10-CM | POA: Diagnosis present

## 2015-08-19 DIAGNOSIS — Z803 Family history of malignant neoplasm of breast: Secondary | ICD-10-CM | POA: Diagnosis not present

## 2015-08-19 DIAGNOSIS — Z886 Allergy status to analgesic agent status: Secondary | ICD-10-CM | POA: Diagnosis not present

## 2015-08-19 DIAGNOSIS — Z853 Personal history of malignant neoplasm of breast: Secondary | ICD-10-CM | POA: Diagnosis not present

## 2015-08-19 DIAGNOSIS — E1143 Type 2 diabetes mellitus with diabetic autonomic (poly)neuropathy: Secondary | ICD-10-CM | POA: Diagnosis not present

## 2015-08-19 DIAGNOSIS — Z95810 Presence of automatic (implantable) cardiac defibrillator: Secondary | ICD-10-CM | POA: Diagnosis not present

## 2015-08-19 DIAGNOSIS — Z882 Allergy status to sulfonamides status: Secondary | ICD-10-CM

## 2015-08-19 DIAGNOSIS — Z888 Allergy status to other drugs, medicaments and biological substances status: Secondary | ICD-10-CM

## 2015-08-19 DIAGNOSIS — I427 Cardiomyopathy due to drug and external agent: Secondary | ICD-10-CM | POA: Diagnosis not present

## 2015-08-19 DIAGNOSIS — J449 Chronic obstructive pulmonary disease, unspecified: Secondary | ICD-10-CM | POA: Diagnosis present

## 2015-08-19 DIAGNOSIS — Z91030 Bee allergy status: Secondary | ICD-10-CM

## 2015-08-19 DIAGNOSIS — N63 Unspecified lump in breast: Secondary | ICD-10-CM | POA: Diagnosis present

## 2015-08-19 DIAGNOSIS — Y9289 Other specified places as the place of occurrence of the external cause: Secondary | ICD-10-CM | POA: Diagnosis not present

## 2015-08-19 DIAGNOSIS — K3184 Gastroparesis: Secondary | ICD-10-CM | POA: Diagnosis not present

## 2015-08-19 DIAGNOSIS — Z9011 Acquired absence of right breast and nipple: Secondary | ICD-10-CM | POA: Diagnosis not present

## 2015-08-19 DIAGNOSIS — Z9221 Personal history of antineoplastic chemotherapy: Secondary | ICD-10-CM

## 2015-08-19 DIAGNOSIS — Z885 Allergy status to narcotic agent status: Secondary | ICD-10-CM | POA: Diagnosis not present

## 2015-08-19 DIAGNOSIS — R0789 Other chest pain: Secondary | ICD-10-CM | POA: Diagnosis not present

## 2015-08-19 DIAGNOSIS — E876 Hypokalemia: Secondary | ICD-10-CM | POA: Diagnosis present

## 2015-08-19 DIAGNOSIS — K219 Gastro-esophageal reflux disease without esophagitis: Secondary | ICD-10-CM | POA: Diagnosis present

## 2015-08-19 DIAGNOSIS — Z792 Long term (current) use of antibiotics: Secondary | ICD-10-CM | POA: Diagnosis not present

## 2015-08-19 DIAGNOSIS — Z87892 Personal history of anaphylaxis: Secondary | ICD-10-CM

## 2015-08-19 DIAGNOSIS — Z794 Long term (current) use of insulin: Secondary | ICD-10-CM | POA: Diagnosis not present

## 2015-08-19 DIAGNOSIS — Z6836 Body mass index (BMI) 36.0-36.9, adult: Secondary | ICD-10-CM

## 2015-08-19 DIAGNOSIS — I25110 Atherosclerotic heart disease of native coronary artery with unstable angina pectoris: Principal | ICD-10-CM | POA: Diagnosis present

## 2015-08-19 DIAGNOSIS — I454 Nonspecific intraventricular block: Secondary | ICD-10-CM | POA: Diagnosis present

## 2015-08-19 DIAGNOSIS — I509 Heart failure, unspecified: Secondary | ICD-10-CM | POA: Diagnosis not present

## 2015-08-19 DIAGNOSIS — I110 Hypertensive heart disease with heart failure: Secondary | ICD-10-CM | POA: Diagnosis not present

## 2015-08-19 DIAGNOSIS — G2581 Restless legs syndrome: Secondary | ICD-10-CM | POA: Diagnosis present

## 2015-08-19 DIAGNOSIS — Z881 Allergy status to other antibiotic agents status: Secondary | ICD-10-CM | POA: Diagnosis not present

## 2015-08-19 DIAGNOSIS — I272 Other secondary pulmonary hypertension: Secondary | ICD-10-CM | POA: Diagnosis not present

## 2015-08-19 DIAGNOSIS — Z923 Personal history of irradiation: Secondary | ICD-10-CM | POA: Diagnosis not present

## 2015-08-19 DIAGNOSIS — I10 Essential (primary) hypertension: Secondary | ICD-10-CM

## 2015-08-19 DIAGNOSIS — I252 Old myocardial infarction: Secondary | ICD-10-CM

## 2015-08-19 DIAGNOSIS — G4733 Obstructive sleep apnea (adult) (pediatric): Secondary | ICD-10-CM | POA: Diagnosis present

## 2015-08-19 DIAGNOSIS — T451X5A Adverse effect of antineoplastic and immunosuppressive drugs, initial encounter: Secondary | ICD-10-CM | POA: Diagnosis present

## 2015-08-19 DIAGNOSIS — R079 Chest pain, unspecified: Secondary | ICD-10-CM | POA: Diagnosis not present

## 2015-08-19 DIAGNOSIS — E782 Mixed hyperlipidemia: Secondary | ICD-10-CM | POA: Diagnosis present

## 2015-08-19 DIAGNOSIS — E669 Obesity, unspecified: Secondary | ICD-10-CM | POA: Diagnosis present

## 2015-08-19 DIAGNOSIS — Z801 Family history of malignant neoplasm of trachea, bronchus and lung: Secondary | ICD-10-CM | POA: Diagnosis not present

## 2015-08-19 DIAGNOSIS — Z91048 Other nonmedicinal substance allergy status: Secondary | ICD-10-CM

## 2015-08-19 LAB — CBC
HCT: 38.3 % (ref 36.0–46.0)
HEMOGLOBIN: 12.4 g/dL (ref 12.0–15.0)
MCH: 27.8 pg (ref 26.0–34.0)
MCHC: 32.4 g/dL (ref 30.0–36.0)
MCV: 85.9 fL (ref 78.0–100.0)
Platelets: 252 10*3/uL (ref 150–400)
RBC: 4.46 MIL/uL (ref 3.87–5.11)
RDW: 16.3 % — ABNORMAL HIGH (ref 11.5–15.5)
WBC: 9.5 10*3/uL (ref 4.0–10.5)

## 2015-08-19 LAB — BASIC METABOLIC PANEL
Anion gap: 11 (ref 5–15)
BUN: 33 mg/dL — ABNORMAL HIGH (ref 6–20)
CALCIUM: 9.5 mg/dL (ref 8.9–10.3)
CO2: 28 mmol/L (ref 22–32)
CREATININE: 1.41 mg/dL — AB (ref 0.44–1.00)
Chloride: 95 mmol/L — ABNORMAL LOW (ref 101–111)
GFR calc Af Amer: 46 mL/min — ABNORMAL LOW (ref >60)
GFR calc non Af Amer: 40 mL/min — ABNORMAL LOW (ref >60)
GLUCOSE: 276 mg/dL — AB (ref 65–99)
Potassium: 3.7 mmol/L (ref 3.5–5.1)
Sodium: 134 mmol/L — ABNORMAL LOW (ref 135–145)

## 2015-08-19 LAB — COMPREHENSIVE METABOLIC PANEL
ALK PHOS: 52 U/L (ref 38–126)
ALT: 19 U/L (ref 14–54)
ANION GAP: 11 (ref 5–15)
AST: 20 U/L (ref 15–41)
Albumin: 3.2 g/dL — ABNORMAL LOW (ref 3.5–5.0)
BUN: 27 mg/dL — ABNORMAL HIGH (ref 6–20)
CALCIUM: 9.4 mg/dL (ref 8.9–10.3)
CO2: 27 mmol/L (ref 22–32)
Chloride: 99 mmol/L — ABNORMAL LOW (ref 101–111)
Creatinine, Ser: 1.2 mg/dL — ABNORMAL HIGH (ref 0.44–1.00)
GFR calc non Af Amer: 48 mL/min — ABNORMAL LOW (ref >60)
GFR, EST AFRICAN AMERICAN: 56 mL/min — AB (ref >60)
Glucose, Bld: 159 mg/dL — ABNORMAL HIGH (ref 65–99)
Potassium: 3.6 mmol/L (ref 3.5–5.1)
SODIUM: 137 mmol/L (ref 135–145)
TOTAL PROTEIN: 7 g/dL (ref 6.5–8.1)
Total Bilirubin: 0.6 mg/dL (ref 0.3–1.2)

## 2015-08-19 LAB — CBC WITH DIFFERENTIAL/PLATELET
BASOS ABS: 0 10*3/uL (ref 0.0–0.1)
BASOS PCT: 0 %
Eosinophils Absolute: 0.2 10*3/uL (ref 0.0–0.7)
Eosinophils Relative: 3 %
HEMATOCRIT: 38.3 % (ref 36.0–46.0)
HEMOGLOBIN: 12.7 g/dL (ref 12.0–15.0)
Lymphocytes Relative: 30 %
Lymphs Abs: 2.3 10*3/uL (ref 0.7–4.0)
MCH: 28.7 pg (ref 26.0–34.0)
MCHC: 33.2 g/dL (ref 30.0–36.0)
MCV: 86.7 fL (ref 78.0–100.0)
MONOS PCT: 7 %
Monocytes Absolute: 0.5 10*3/uL (ref 0.1–1.0)
NEUTROS ABS: 4.7 10*3/uL (ref 1.7–7.7)
NEUTROS PCT: 60 %
Platelets: 231 10*3/uL (ref 150–400)
RBC: 4.42 MIL/uL (ref 3.87–5.11)
RDW: 16.3 % — ABNORMAL HIGH (ref 11.5–15.5)
WBC: 7.8 10*3/uL (ref 4.0–10.5)

## 2015-08-19 LAB — LIPID PANEL
CHOLESTEROL: 147 mg/dL (ref 0–200)
HDL: 37 mg/dL — AB (ref >40)
LDL CALC: 51 mg/dL (ref 0–99)
TRIGLYCERIDES: 297 mg/dL — AB (ref <150)
Total CHOL/HDL Ratio: 4 RATIO
VLDL: 59 mg/dL — ABNORMAL HIGH (ref 0–40)

## 2015-08-19 LAB — APTT: aPTT: 69 seconds — ABNORMAL HIGH (ref 24–37)

## 2015-08-19 LAB — CBG MONITORING, ED: GLUCOSE-CAPILLARY: 161 mg/dL — AB (ref 65–99)

## 2015-08-19 LAB — CREATININE, SERUM
CREATININE: 1.17 mg/dL — AB (ref 0.44–1.00)
GFR, EST AFRICAN AMERICAN: 57 mL/min — AB (ref >60)
GFR, EST NON AFRICAN AMERICAN: 50 mL/min — AB (ref >60)

## 2015-08-19 LAB — PROTIME-INR
INR: 1.16 (ref 0.00–1.49)
PROTHROMBIN TIME: 15 s (ref 11.6–15.2)

## 2015-08-19 LAB — I-STAT TROPONIN, ED
TROPONIN I, POC: 0.01 ng/mL (ref 0.00–0.08)
Troponin i, poc: 0 ng/mL (ref 0.00–0.08)
Troponin i, poc: 0.01 ng/mL (ref 0.00–0.08)

## 2015-08-19 LAB — TSH: TSH: 3.13 u[IU]/mL (ref 0.350–4.500)

## 2015-08-19 LAB — BRAIN NATRIURETIC PEPTIDE: B NATRIURETIC PEPTIDE 5: 142.7 pg/mL — AB (ref 0.0–100.0)

## 2015-08-19 MED ORDER — INSULIN ASPART 100 UNIT/ML ~~LOC~~ SOLN
0.0000 [IU] | Freq: Every day | SUBCUTANEOUS | Status: DC
  Administered 2015-08-20 – 2015-08-21 (×2): 3 [IU] via SUBCUTANEOUS

## 2015-08-19 MED ORDER — AMIODARONE HCL 200 MG PO TABS
200.0000 mg | ORAL_TABLET | Freq: Every day | ORAL | Status: DC
  Administered 2015-08-19 – 2015-08-22 (×4): 200 mg via ORAL
  Filled 2015-08-19 (×5): qty 1

## 2015-08-19 MED ORDER — CARVEDILOL 12.5 MG PO TABS
12.5000 mg | ORAL_TABLET | Freq: Two times a day (BID) | ORAL | Status: DC
  Administered 2015-08-19 – 2015-08-22 (×4): 12.5 mg via ORAL
  Filled 2015-08-19 (×5): qty 1

## 2015-08-19 MED ORDER — TIOTROPIUM BROMIDE MONOHYDRATE 18 MCG IN CAPS
18.0000 ug | ORAL_CAPSULE | Freq: Every day | RESPIRATORY_TRACT | Status: DC
  Administered 2015-08-20 – 2015-08-22 (×3): 18 ug via RESPIRATORY_TRACT
  Filled 2015-08-19 (×2): qty 5

## 2015-08-19 MED ORDER — HEPARIN SODIUM (PORCINE) 5000 UNIT/ML IJ SOLN
60.0000 [IU]/kg | INTRAMUSCULAR | Status: AC
  Administered 2015-08-19: 6150 [IU] via INTRAVENOUS
  Filled 2015-08-19: qty 2

## 2015-08-19 MED ORDER — BUSPIRONE HCL 15 MG PO TABS
30.0000 mg | ORAL_TABLET | Freq: Two times a day (BID) | ORAL | Status: DC
  Administered 2015-08-19 – 2015-08-22 (×6): 30 mg via ORAL
  Filled 2015-08-19: qty 2
  Filled 2015-08-19 (×2): qty 3
  Filled 2015-08-19 (×5): qty 2

## 2015-08-19 MED ORDER — FUROSEMIDE 20 MG PO TABS
20.0000 mg | ORAL_TABLET | Freq: Every day | ORAL | Status: DC
  Administered 2015-08-20 – 2015-08-21 (×2): 20 mg via ORAL
  Filled 2015-08-19 (×2): qty 1

## 2015-08-19 MED ORDER — INSULIN ASPART 100 UNIT/ML ~~LOC~~ SOLN
0.0000 [IU] | Freq: Three times a day (TID) | SUBCUTANEOUS | Status: DC
  Administered 2015-08-20: 7 [IU] via SUBCUTANEOUS
  Administered 2015-08-20: 5 [IU] via SUBCUTANEOUS
  Administered 2015-08-22: 2 [IU] via SUBCUTANEOUS
  Administered 2015-08-22: 3 [IU] via SUBCUTANEOUS

## 2015-08-19 MED ORDER — LEVOTHYROXINE SODIUM 75 MCG PO TABS
75.0000 ug | ORAL_TABLET | Freq: Every day | ORAL | Status: DC
  Administered 2015-08-20 – 2015-08-22 (×3): 75 ug via ORAL
  Filled 2015-08-19 (×5): qty 1

## 2015-08-19 MED ORDER — DULOXETINE HCL 30 MG PO CPEP
30.0000 mg | ORAL_CAPSULE | Freq: Every day | ORAL | Status: DC
Start: 2015-08-19 — End: 2015-08-22
  Administered 2015-08-20 – 2015-08-22 (×3): 30 mg via ORAL
  Filled 2015-08-19 (×5): qty 1

## 2015-08-19 MED ORDER — HEPARIN SODIUM (PORCINE) 5000 UNIT/ML IJ SOLN
5000.0000 [IU] | Freq: Three times a day (TID) | INTRAMUSCULAR | Status: DC
  Administered 2015-08-19 – 2015-08-22 (×8): 5000 [IU] via SUBCUTANEOUS
  Filled 2015-08-19 (×8): qty 1

## 2015-08-19 MED ORDER — SODIUM CHLORIDE 0.9 % IV SOLN
INTRAVENOUS | Status: DC
  Administered 2015-08-19: 1000 mL via INTRAVENOUS
  Administered 2015-08-21: 1 mL via INTRAVENOUS

## 2015-08-19 MED ORDER — ATORVASTATIN CALCIUM 40 MG PO TABS
40.0000 mg | ORAL_TABLET | Freq: Every day | ORAL | Status: DC
  Administered 2015-08-19: 40 mg via ORAL
  Filled 2015-08-19: qty 4

## 2015-08-19 MED ORDER — ROPINIROLE HCL 1 MG PO TABS
1.0000 mg | ORAL_TABLET | Freq: Every day | ORAL | Status: DC
  Administered 2015-08-19 – 2015-08-21 (×3): 1 mg via ORAL
  Filled 2015-08-19 (×4): qty 1

## 2015-08-19 MED ORDER — CLINDAMYCIN HCL 300 MG PO CAPS
300.0000 mg | ORAL_CAPSULE | Freq: Every day | ORAL | Status: DC
  Administered 2015-08-20 – 2015-08-21 (×2): 300 mg via ORAL
  Filled 2015-08-19: qty 2
  Filled 2015-08-19 (×2): qty 1

## 2015-08-19 MED ORDER — SODIUM CHLORIDE 0.9 % IJ SOLN
3.0000 mL | Freq: Two times a day (BID) | INTRAMUSCULAR | Status: DC
  Administered 2015-08-19 – 2015-08-21 (×2): 3 mL via INTRAVENOUS

## 2015-08-19 MED ORDER — CLOPIDOGREL BISULFATE 300 MG PO TABS
300.0000 mg | ORAL_TABLET | Freq: Once | ORAL | Status: AC
  Administered 2015-08-19: 300 mg via ORAL
  Filled 2015-08-19: qty 1

## 2015-08-19 MED ORDER — NITROGLYCERIN IN D5W 200-5 MCG/ML-% IV SOLN
5.0000 ug/min | INTRAVENOUS | Status: DC
  Administered 2015-08-19: 5 ug/min via INTRAVENOUS
  Filled 2015-08-19: qty 250

## 2015-08-19 MED ORDER — IVABRADINE HCL 5 MG PO TABS
5.0000 mg | ORAL_TABLET | Freq: Two times a day (BID) | ORAL | Status: DC
  Administered 2015-08-20 – 2015-08-22 (×4): 5 mg via ORAL
  Filled 2015-08-19 (×10): qty 1

## 2015-08-19 MED ORDER — DIGOXIN 125 MCG PO TABS
0.1250 mg | ORAL_TABLET | ORAL | Status: DC
  Administered 2015-08-21: 0.125 mg via ORAL
  Filled 2015-08-19 (×2): qty 1

## 2015-08-19 MED ORDER — PANTOPRAZOLE SODIUM 40 MG PO TBEC
40.0000 mg | DELAYED_RELEASE_TABLET | Freq: Two times a day (BID) | ORAL | Status: DC
  Administered 2015-08-19 – 2015-08-22 (×6): 40 mg via ORAL
  Filled 2015-08-19 (×6): qty 1

## 2015-08-19 NOTE — ED Provider Notes (Signed)
CSN: [id number]     Arrival date & time 08/19/15  1347 History   First MD Initiated Contact with Patient 08/19/15 1509     Chief Complaint  Patient presents with  . Chest Pain     (Consider location/radiation/quality/duration/timing/severity/associated sxs/prior Treatment) Patient is a 60 y.o. female presenting with chest pain. The history is provided by the patient.  Chest Pain Pain location:  Substernal area Pain quality: shooting and tightness   Pain radiates to:  Upper back, L jaw, L shoulder and L arm Pain severity:  Mild Onset quality:  Sudden Duration: 3. Timing:  Constant Progression:  Partially resolved Chronicity:  Recurrent Context: movement   Context: no trauma   Relieved by:  Rest Worsened by:  Nothing tried Ineffective treatments:  Nitroglycerin Associated symptoms: no abdominal pain, no altered mental status, no cough, no diaphoresis, no fever, no nausea, no shortness of breath and no syncope   Risk factors: coronary artery disease     Past Medical History  Diagnosis Date  . 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 DYSLIPIDEMIA 05/01/2009  . OBESITY 07/24/2007  . OBSTRUCTIVE SLEEP APNEA 07/24/2007    with CPAP compliance  . RESTLESS LEG SYNDROME 07/24/2007  . SYSTOLIC HEART FAILURE, CHRONIC 01/05/2009    a. chemo induced cardiomyopathy b. cMRI (02/2009) EF 45% c. Myoview 05/2010: EF 59%, no ischemia d. RHC (01/2012) RA 20, RV 55/13/22, PA 56/34 (44), PCWP 28, Fick CO/CI: 3.1 / 1.5, PVR 5.3 WU, PA sat 42% & 48% e. ECHO (10/2012) EF 20-25%, diff HK, MV calcified annulus f. ECHO (03/2014) EF 30-35%, sev HK, inferior/inferoseptal walls, mild MR  . Palpitations 09/20/2010  . 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 Hypertension   . History of breast cancer 2006    with return in 2008, s/p mastectomy, XRT and chemotherapy  . Osteoarthritis   . Anxiety   . Depression     followed by a psychiatrist  . Asthma   . GERD (gastroesophageal reflux disease)   . Gastroparesis   . IBS (irritable bowel  syndrome)   . Myocardial infarction ([place])   . ICD (implantable cardiac defibrillator) in place 11/10/2012    ICD (11/2012)  . COPD (chronic obstructive pulmonary disease) [facility])    Past Surgical History  Procedure Laterality Date  . Mastectomy      right  . Abdominal hysterectomy  1991  . Cardiac defibrillator placement  11/10/2012    SJM Fortify Assura VR implanted by Dr Rayann Heman for primary prevention  . Cholecystectomy    . Wrist surgery Bilateral   . Knee cartilage surgery    . Bi-ventricular implantable cardioverter defibrillator N/A 11/10/2012    Procedure: BI-VENTRICULAR IMPLANTABLE CARDIOVERTER DEFIBRILLATOR  (CRT-D);  Surgeon: Thompson Grayer, MD;  Location[facility] C[facility] LAB;  Service: Cardiovascular;  Laterality: N/A;   Family History  Problem Relation Age of Onset  . Diabetes Mother     lung cancer, CHF, DM2  . Coronary artery disease Father 80    HTN, CABG x 3, DM2, deceased  . Cancer Cousin     Breast cancer   . Cancer Cousin     Breast cancer  . Cancer Cousin     Uterine cancer   Social History  Substance Use Topics  . Smoking status: Former Smoker    Quit date: 04/12/2001  . Smokeless tobacco: Never Used  . Alcohol Use: No   OB History    No data available     Review of Systems  Constitutional: Negative for fever and diaphoresis.  HENT: Negative.   Eyes: Negative.   Respiratory: Negative for cough and shortness of breath.   Cardiovascular: Positive for chest pain. Negative for syncope.  Gastrointestinal: Negative for nausea and abdominal pain.  Genitourinary: Negative.   Musculoskeletal: Negative.   Skin: Negative.   Neurological: Negative.  Negative for syncope.      Allergies  Bee venom; Other; Aspirin; Ciprofloxacin; Codeine; Naproxen; Sulfonamide derivatives; Nsaids; and Tape  Home Medications   Prior to Admission medications   Medication Sig Start Date End Date Taking? Authorizing Provider  acetaminophen (TYLENOL ARTHRITIS PAIN) 650 MG CR tablet  Take 1,300 mg by mouth every 8 (eight) hours as needed. pain   Yes Historical Provider, MD  albuterol (PROVENTIL HFA) 108 (90 BASE) MCG/ACT inhaler Inhale 2 puffs into the lungs every 6 (six) hours as needed. For shortness of breath   Yes Historical Provider, MD  amiodarone (PACERONE) 200 MG tablet Take 1 tablet (200 mg total) by mouth daily. 08/08/15  Yes Amy D Clegg, NP  atorvastatin (LIPITOR) 40 MG tablet Take 40 mg by mouth daily.     Yes Historical Provider, MD  busPIRone (BUSPAR) 30 MG tablet Take 30 mg by mouth 2 (two) times daily.    Yes Historical Provider, MD  carvedilol (COREG) 12.5 MG tablet Take 12.5 mg by mouth 2 (two) times daily with a meal.   Yes Historical Provider, MD  clindamycin (CLEOCIN) 300 MG capsule Take 300 mg by mouth daily.    Yes Historical Provider, MD  digoxin (LANOXIN) 0.125 MG tablet Take 1 tablet (0.125 mg total) by mouth every other day. 06/28/15  Yes Jolaine Artist, MD  DULoxetine (CYMBALTA) 30 MG capsule Take 30 mg by mouth daily.   Yes Amy Moon, NP  furosemide (LASIX) 20 MG tablet 40 mg twice a day Patient taking differently: Take 40 mg by mouth See admin instructions. TAKES 40MG  TWICE DAILY, CAN TAKE AN ADDT'L TABLET AS NEEDED, USUALLY TWICE WEEKLY 06/28/15  Yes Jolaine Artist, MD  Glucosamine 500 MG CAPS Take 1 capsule by mouth daily.     Yes Historical Provider, MD  insulin lispro (HUMALOG) 100 UNIT/ML injection Inject 20 Units into the skin daily with supper.   Yes Historical Provider, MD  ivabradine (CORLANOR) 5 MG TABS tablet Take 1 tablet (5 mg total) by mouth 2 (two) times daily with a meal. 08/08/15  Yes Amy D Clegg, NP  levothyroxine (SYNTHROID, LEVOTHROID) 75 MCG tablet Take 75 mcg by mouth daily before breakfast.    Yes Historical Provider, MD  metolazone (ZAROXOLYN) 2.5 MG tablet Take 2.5 mg as needed for weight gain. Patient taking differently: Take 2.5 mg by mouth daily as needed (FOR WEIGHT GAIN >3 LBS.).  07/27/15  Yes Shaune Pascal Bensimhon, MD   mometasone (NASONEX) 50 MCG/ACT nasal spray Place 1 spray into both nostrils daily. allergies   Yes Historical Provider, MD  nitroGLYCERIN (NITROSTAT) 0.4 MG SL tablet Place 1 tablet (0.4 mg total) under the tongue every 5 (five) minutes as needed. For chest pain 03/08/14  Yes Jolaine Artist, MD  omega-3 acid ethyl esters (LOVAZA) 1 G capsule Take 2 g by mouth 2 (two) times daily.    Yes Historical Provider, MD  pantoprazole (PROTONIX) 40 MG tablet Take 40 mg by mouth 2 (two) times daily.     Yes Historical Provider, MD  potassium chloride SA (K-DUR,KLOR-CON) 20 MEQ tablet Take 3 tablets (60 mEq total) by mouth 2 (two) times daily. 04/26/15  Yes Shaune Pascal  Bensimhon, MD  ramipril (ALTACE) 10 MG capsule Take 1 capsule (10 mg total) by mouth daily. Patient taking differently: Take 10 mg by mouth every evening.  06/01/15  Yes Shaune Pascal Bensimhon, MD  rOPINIRole (REQUIP) 1 MG tablet Take 1 mg by mouth at bedtime.     Yes Historical Provider, MD  spironolactone (ALDACTONE) 25 MG tablet TAKE 1 TABLET BY MOUTH DAILY Patient taking differently: TAKE 1 TABLET BY MOUTH DAILY AROUND LUNCH 04/26/15  Yes Jolaine Artist, MD  SUMAtriptan (IMITREX) 25 MG tablet Take 25 mg by mouth every 2 (two) hours as needed. For migraine   Yes Historical Provider, MD  tiotropium (SPIRIVA) 18 MCG inhalation capsule Place 18 mcg into inhaler and inhale daily.     Yes Historical Provider, MD  TRESIBA FLEXTOUCH 200 UNIT/ML SOPN Inject 160 Units into the skin every morning.  12/24/14  Yes Historical Provider, MD  vitamin B-12 (CYANOCOBALAMIN) 1000 MCG tablet Take 1,000 mcg by mouth daily.   Yes Historical Provider, MD  vitamin C (ASCORBIC ACID) 500 MG tablet Take 500 mg by mouth daily.   Yes Historical Provider, MD  vitamin E 400 UNIT capsule Take 400 Units by mouth daily.   Yes Historical Provider, MD  Garlic 123XX123 MG CAPS Take 1 capsule by mouth daily. Reported on 08/19/2015    Historical Provider, MD  Insulin Pen Needle (B-D  ULTRAFINE III SHORT PEN) 31G X 8 MM MISC 18/day as directed dx 250.00 08/01/11   Renato Shin, MD   BP 107/57 mmHg  Pulse 89  Temp(Src) 97.8 F (36.6 C) (Oral)  Resp 21  Wt 102.195 kg  SpO2 99% Physical Exam  Constitutional: She is oriented to person, place, and time. She appears well-developed and well-nourished. No distress.  HENT:  Head: Normocephalic and atraumatic.  Mouth/Throat: No oropharyngeal exudate.  Eyes: Conjunctivae are normal. Pupils are equal, round, and reactive to light. No scleral icterus.  Neck: Normal range of motion.  Cardiovascular: Normal rate, normal heart sounds and intact distal pulses.  Exam reveals no friction rub.   No murmur heard. Pulmonary/Chest: Effort normal and breath sounds normal. No respiratory distress. She has no wheezes. She has no rales. She exhibits no tenderness.  Abdominal: Soft. She exhibits no distension. There is no tenderness.  Musculoskeletal: Normal range of motion. She exhibits no edema or tenderness.  Neurological: She is alert and oriented to person, place, and time. No cranial nerve deficit. She exhibits normal muscle tone.  Skin: Skin is warm. No rash noted. She is not diaphoretic. No erythema. No pallor.  Psychiatric: She has a normal mood and affect.  Nursing note and vitals reviewed.   ED Course  Procedures (including critical care time) Labs Review Labs Reviewed  BASIC METABOLIC PANEL - Abnormal; Notable for the following:    Sodium 134 (*)    Chloride 95 (*)    Glucose, Bld 276 (*)    BUN 33 (*)    Creatinine, Ser 1.41 (*)    GFR calc non Af Amer 40 (*)    GFR calc Af Amer 46 (*)    All other components within normal limits  CBC - Abnormal; Notable for the following:    RDW 16.3 (*)    All other components within normal limits  APTT - Abnormal; Notable for the following:    aPTT 69 (*)    All other components within normal limits  PROTIME-INR  I-STAT TROPOININ, ED  Randolm Idol, ED    Imaging  Review Dg Chest 2 View  08/19/2015  CLINICAL DATA:  Chest pain and nausea.  Status post right mastectomy EXAM: CHEST  2 VIEW COMPARISON:  Dec 25, 2014 FINDINGS: Mild interstitial prominence diffusely is stable. 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 There is no edema or consolidation. The heart size and pulmonary vascularity are normal. No adenopathy. Pacemaker lead is attached to the right ventricle. Patient is status post right mastectomy. There is degenerative change in each shoulder. IMPRESSION: Stable mild diffuse interstitial prominence without edema or consolidation. 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 No new opacity. No change in cardiac silhouette. Electronically Signed   By: Lowella Grip III M.D.   On: 08/19/2015 15:09   I have personally reviewed and evaluated these images and lab results as part of my medical decision-making.   EKG Interpretation   Date/Time:  Saturday August 19 2015 14:04:36 EST Ventricular Rate:  84 PR Interval:  244 QRS Duration: 138 QT Interval:  420 QTC Calculation: 496 R Axis:   -79 Text Interpretation:  Sinus rhythm with 1st degree A-V block Left axis  deviation Right bundle branch block Septal infarct , age undetermined  Possible Lateral infarct , age undetermined Abnormal ECG ST elevation  consider inferior injury or acute infarct st elevation inferior and  lateral leads Reconfirmed by RAY MD, Andee Poles [phone]) on 08/19/2015 3:37:05  PM      MDM   Final diagnoses:  None   Patient is a 60 year old female with a past medical history of CHF, COPD, and ICD in place who presents with acute onset chest pain that started around 1:15 PM today while she was walking around more than usual. She states that the pain radiates her left arm and back and it is consistent with her past experience with MIs. She took 3 Nitro prior to arrival without acute relief of pain. She did  not take aspirin secondary to allergy. Denies any shortness of breath or lower extremity swelling. Further history and exam as above notable for stable vital signs and currently with pain that's 2 out of 10. EKG with bundle branch block that's known but does appear to have elevation of her lateral and inferior leads compared to prior EKG. Initial troponin negative. Given changes on EKG changes patient was made a code STEMI and started on nitroglycerin and heparin. Cardiology reviewed the patient's EKGs and recommended medical management at this time.  Patient to be admitted to cardiology for further evaluation and management of ACS.    Heriberto Antigua, MD 08/20/15 [id number]  Pattricia Boss, MD 08/22/15 [phone]

## 2015-08-19 NOTE — ED Notes (Addendum)
Pt reports left sided CP starting 30 minutes ago. Pt reports the pain goes down her left arm. Pt alert x4. NAD at this time. Pt is scheduled for a stress test on Thursday. Pt reports taking 3 nitro tabs prior to arrival with mild relief.

## 2015-08-19 NOTE — ED Notes (Signed)
Placed on hospital bed at this time 

## 2015-08-19 NOTE — Progress Notes (Signed)
    Code STEMI called on the patient.  I personally reviewed the ECGs and her history.  SHe has a nonischemic cardiomyopathy.  EF was 20% in 11/16.  Nonobstuctive disease noted by prior cath.  ECG shows persistent inferior ST elevation with persistent widened QRS.  No clear change from prior.  Would medically manage the patient at this time.  IV NTG and evaluation for fluid overload as a cause of her sx.  Cycle enzyme.  If enzymes increase, could reconsider urgent cath.    Jettie Booze, MD

## 2015-08-19 NOTE — ED Provider Notes (Signed)
EKG reviewed and st elevation with coving noted with changes from prior.  Patient with symptoms c.w. Prior MI.  Code stemi initiated.  Discussed with Dr. Scarlette Calico and based on patient's improving symptoms, initial negative troponin, and widened qrs with some previous changes STEMI canceled.  Heparin, antiplatelet and iv nitor intitiated and patient to be admitted.    I performed a history and physical examination of [name] and discussed her management with Dr. Lanetta Inch.  I agree with the history, physical, assessment, and plan of care, with the following exceptions: None  I was present for the following procedures: None Time Spent in Critical Care of the patient: None Time spent in discussions with the patient and family: 36  Alissah Redmon Shaune Pollack, MD 08/19/15 1739

## 2015-08-19 NOTE — H&P (Signed)
CARDIOLOGY INPATIENT HISTORY AND PHYSICAL EXAMINATION NOTE  Patient ID: [name] MRN: [id number], DOB/AGE: 1955-12-13   Admit date: 08/19/2015   Primary Physician: Nicoletta Dress, MD Primary Cardiologist: Darrick Grinder NP/Dan Bensimohn MD Primary EP: Dr. 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   Patient presented today with complaints of chest pain to the ED. Patient has been having these chest pains for the last 3 weeks. These are pins and needles present in the left precordium and goes to the back and goes to the left arm. It also sometimes feels like indigestion. it usually occurs at rest and happens when she is trying to sleep or it wakes her up from the sleep. These have been ongoing off and on since 05/2015. She has been having feeling of chest pulsation and fatigue since then. On arrival to the ED, initial workup with EKG suggested inferior ST elevaitons. Patient was evaluated by Dr. Okey Regal and recommended to be medically managed as the EKG did not change from previous known EKG. Patient had nuclear cardiac scan performed in 12/2014 which showed multiple scattered perfusion defects suggestive of non ischemic cardiomyopathy or possible multivessel disease. No LHC performed in the past.  She was seen by Nurse Ninfa Meeker on 1/3 and was found to have NSVT. Amiodarone was added at that time and coreg was reduced to 12.5 mg BID. Her weight has been stable and was 220 this morning. She remains between 220 - 225. She can walk for 50 feet and gets SOB. Exercise tolerance has mildly increased.  Patient is being evaluated for a lump in the left breast and is planned to have a biopsy on 08/21/2015. She also has  a metabolic bike stress test scheduled on 08/24/2015 (scheduled by Dr. Lucky Rathke).  Regarding her CHF, patient had LVEF drop noted from 45% in 2010, to 20% in 2014 and has remained similar. She did not have any HF hospitalizations in the last one year. She is very compliant with her medicaitons and follows dietary and fluid restrictions very well. Last RHC in 01/2012 showed low cardiac output, w/ RA = 20, RV = 55/13 PA 44, PCWP = 28 FickCO 3.1 FCI 1.5, PVR 5.3 woods . Other work up includes:  Myoview in 05/2010 EF 59%. No ischemia.  Echo 08/12/11: Left ventricle: mildly dilated, EF 20% to 25%. Diffuse hypokinesis. Doppler parameters are consistent with restrictive physiology, indicative of decreased left ventricular diastolic compliance and/or increased left atrial pressure. Left atrium was moderately dilated. Right atrium mildly dilated. 04/09/12 ECHO EF 30-35% LV mildly dilated Hyopkinesis 10/07/12 ECHO EF 20-25% RV ok. 03/31/14: ECHO EF 30-35%, severe HK inferior/inferoseptal walls, mild MR, LA mod dilated, RA mildly dilated 01/03/15: NM perfusion scan: Myocardial perfusion is abnormal. Patchy tracer uptake with multifocal filling defects. This could represent multivessel ischemia or possibily infiltrative cardiomyopathy. Findings consistent with prior myocardial infarction with peri-infarct ischemia. This is an intermediate risk study. Overall left ventricular systolic function was abnormal. LV cavity size is moderately enlarged. The left ventricular ejection fraction is moderately decreased (38%). There is no prior study for comparison. 06/28/2015 ECHO: Mildly dilated LV with EF 20%, diffuse hypokinesis. Moderatediastolic dysfunction with evidence for elevated LV filling pressure. Mild-moderately dilated RV with  mildly decreased systolic function. Mild to moderate pulmonary hypertension. Dilated IVC suggestive of elevated RV filling pressure.  Problem List: Past Medical History  Diagnosis Date    . 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 DYSLIPIDEMIA 05/01/2009  . OBESITY 07/24/2007  . OBSTRUCTIVE SLEEP APNEA 07/24/2007    with CPAP compliance  . RESTLESS LEG SYNDROME 07/24/2007  . SYSTOLIC HEART FAILURE, CHRONIC 01/05/2009    a. chemo induced cardiomyopathy b. cMRI (02/2009) EF 45% c. Myoview 05/2010: EF 59%, no ischemia d. RHC (01/2012) RA 20, RV 55/13/22, PA 56/34 (44), PCWP 28, Fick CO/CI: 3.1 / 1.5, PVR 5.3 WU, PA sat 42% & 48% e. ECHO (10/2012) EF 20-25%, diff HK, MV calcified annulus f. ECHO (03/2014) EF 30-35%, sev HK, inferior/inferoseptal walls, mild MR  . Palpitations 09/20/2010  . 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 Hypertension   . History of breast cancer 2006    with return in 2008, s/p mastectomy, XRT and chemotherapy  . Osteoarthritis   . Anxiety   . Depression     followed by a psychiatrist  . Asthma   . GERD (gastroesophageal reflux disease)   . Gastroparesis   . IBS (irritable bowel syndrome)   . Myocardial infarction ([employer])   . ICD (implantable cardiac defibrillator) in place 11/10/2012    ICD (11/2012)  . COPD (chronic obstructive pulmonary disease) [facility])     Past Surgical History  Procedure Laterality Date  . Mastectomy      right  . Abdominal hysterectomy  1991  . Cardiac defibrillator placement  11/10/2012    SJM Fortify Assura VR implanted by Dr Rayann Heman for primary prevention  . Cholecystectomy    . Wrist surgery Bilateral   . Knee cartilage surgery    . Bi-ventricular implantable cardioverter defibrillator N/A 11/10/2012    Procedure: BI-VENTRICULAR IMPLANTABLE CARDIOVERTER DEFIBRILLATOR  (CRT-D);  Surgeon: Thompson Grayer, MD;  Location: [facility] CATH LAB;  Service: Cardiovascular;  Laterality: N/A;     Allergies:  Allergies  Allergen Reactions  . Bee Venom Anaphylaxis  . Other Other (See Comments)    ANY TYPES OF METAL-CAUSES BLISTERS SKIN AND ITCHING   . Aspirin Hives  . Ciprofloxacin Hives  . Codeine Nausea Only    "can take ONLY if she eats with med"  . Naproxen Hives   . Sulfonamide Derivatives Hives  . Nsaids Itching and Rash  . Tape Rash    Also allergic to metal     Home Medications Current Facility-Administered Medications  Medication Dose Route Frequency Provider Last Rate Last Dose  . 0.9 %  sodium chloride infusion   Intravenous Continuous Pattricia Boss, MD 20 mL/hr at 08/19/15 1355 1,000 mL at 08/19/15 1355  . nitroGLYCERIN 50 mg in dextrose 5 % 250 mL (0.2 mg/mL) infusion  5 mcg/min Intravenous Titrated Pattricia Boss, MD 1.5 mL/hr at 08/19/15 1559 5 mcg/min at 08/19/15 1559   Current Outpatient Prescriptions  Medication Sig Dispense Refill  . acetaminophen (TYLENOL ARTHRITIS PAIN) 650 MG CR tablet Take 1,300 mg by mouth every 8 (eight) hours as needed. pain    . albuterol (PROVENTIL HFA) 108 (90 BASE) MCG/ACT inhaler Inhale 2 puffs into the lungs every 6 (six) hours as needed. For shortness of breath    . amiodarone (PACERONE) 200 MG tablet Take 1 tablet (200 mg total) by mouth daily. 30 tablet 3  . atorvastatin (LIPITOR) 40 MG tablet Take 40 mg by mouth daily.      . busPIRone (BUSPAR) 30 MG tablet Take 30 mg by mouth 2 (  two) times daily.     . carvedilol (COREG) 12.5 MG tablet Take 12.5 mg by mouth 2 (two) times daily with a meal.    . clindamycin (CLEOCIN) 300 MG capsule Take 300 mg by mouth daily.     . digoxin (LANOXIN) 0.125 MG tablet Take 1 tablet (0.125 mg total) by mouth every other day. 90 tablet 3  . DULoxetine (CYMBALTA) 30 MG capsule Take 30 mg by mouth daily.    . furosemide (LASIX) 20 MG tablet 40 mg twice a day (Patient taking differently: Take 40 mg by mouth See admin instructions. TAKES 40MG  TWICE DAILY, CAN TAKE AN ADDT'L TABLET AS NEEDED, USUALLY TWICE WEEKLY) 540 tablet 3  . Glucosamine 500 MG CAPS Take 1 capsule by mouth daily.      . insulin lispro (HUMALOG) 100 UNIT/ML injection Inject 20 Units into the skin daily with supper.    . ivabradine (CORLANOR) 5 MG TABS tablet Take 1 tablet (5 mg total) by mouth 2 (two) times  daily with a meal. 60 tablet 3  . levothyroxine (SYNTHROID, LEVOTHROID) 75 MCG tablet Take 75 mcg by mouth daily before breakfast.     . metolazone (ZAROXOLYN) 2.5 MG tablet Take 2.5 mg as needed for weight gain. (Patient taking differently: Take 2.5 mg by mouth daily as needed (FOR WEIGHT GAIN >3 LBS.). ) 30 tablet 3  . mometasone (NASONEX) 50 MCG/ACT nasal spray Place 1 spray into both nostrils daily. allergies    . nitroGLYCERIN (NITROSTAT) 0.4 MG SL tablet Place 1 tablet (0.4 mg total) under the tongue every 5 (five) minutes as needed. For chest pain 25 tablet 3  . omega-3 acid ethyl esters (LOVAZA) 1 G capsule Take 2 g by mouth 2 (two) times daily.     . pantoprazole (PROTONIX) 40 MG tablet Take 40 mg by mouth 2 (two) times daily.      . potassium chloride SA (K-DUR,KLOR-CON) 20 MEQ tablet Take 3 tablets (60 mEq total) by mouth 2 (two) times daily. 360 tablet 3  . ramipril (ALTACE) 10 MG capsule Take 1 capsule (10 mg total) by mouth daily. (Patient taking differently: Take 10 mg by mouth every evening. ) 90 capsule 3  . rOPINIRole (REQUIP) 1 MG tablet Take 1 mg by mouth at bedtime.      Marland Kitchen spironolactone (ALDACTONE) 25 MG tablet TAKE 1 TABLET BY MOUTH DAILY (Patient taking differently: TAKE 1 TABLET BY MOUTH DAILY AROUND LUNCH) 90 tablet 3  . SUMAtriptan (IMITREX) 25 MG tablet Take 25 mg by mouth every 2 (two) hours as needed. For migraine    . tiotropium (SPIRIVA) 18 MCG inhalation capsule Place 18 mcg into inhaler and inhale daily.      . TRESIBA FLEXTOUCH 200 UNIT/ML SOPN Inject 160 Units into the skin every morning.     . vitamin B-12 (CYANOCOBALAMIN) 1000 MCG tablet Take 1,000 mcg by mouth daily.    . vitamin C (ASCORBIC ACID) 500 MG tablet Take 500 mg by mouth daily.    . vitamin E 400 UNIT capsule Take 400 Units by mouth daily.    . Garlic 123XX123 MG CAPS Take 1 capsule by mouth daily. Reported on 08/19/2015    . Insulin Pen Needle (B-D ULTRAFINE III SHORT PEN) 31G X 8 MM MISC 18/day as  directed dx 250.00 500 each 11     Family History  Problem Relation Age of Onset  . Diabetes Mother     lung cancer, CHF, DM2  . Coronary artery  disease Father 24    HTN, CABG x 3, DM2, deceased  . Cancer Cousin     Breast cancer   . Cancer Cousin     Breast cancer  . Cancer Cousin     Uterine cancer     Social History   Social History  . Marital Status: Divorced    Spouse Name: N/A  . Number of Children: N/A  . Years of Education: N/A   Occupational History  . Disabled    Social History Main Topics  . Smoking status: Former Smoker    Quit date: 04/12/2001  . Smokeless tobacco: Never Used  . Alcohol Use: No  . Drug Use: No  . Sexual Activity: Not Currently   Other Topics Concern  . Not on file   Social History Narrative   Lives with mother and brother Philis Nettle           Review of Systems: General: fatigue negative for chills, fever, night sweats or weight changes.  Cardiovascular: chest pain, dyspnea, DOE, palpitations negative for edema Dermatological: negative for rash Respiratory: negative for cough or wheezing Urologic: negative for hematuria Abdominal: negative for nausea, vomiting, diarrhea, bright red blood per rectum, melena, or hematemesis Neurologic: negative for visual changes, syncope, or dizziness Endocrine: diabetes Immunological: no lymph adenopathy Psych: non homicidal/suicidal  Physical Exam: Vitals: BP 114/52 mmHg  Pulse 89  Temp(Src) 97.8 F (36.6 C) (Oral)  Resp 23  Wt 102.195 kg (225 lb 4.8 oz)  SpO2 97% General: not in acute distress Neck: JVP flat, neck supple Heart: regular rate and rhythm, S1, S2, soft grade II/VI systolic flow murmur at RUSB   Lungs: CTAB  GI: bilateral upper abdominal hernia present, no sign of strangulation, non tender, non distended, bowel sounds present Extremities: no edema Neuro: AAO x 3 Psych: normal affect, no anxiety  Labs:   Results for orders placed or performed during the hospital  encounter of 08/19/15 (from the past 24 hour(s))  Basic metabolic panel     Status: Abnormal   Collection Time: 08/19/15  2:18 PM  Result Value Ref Range   Sodium 134 (L) 135 - 145 mmol/L   Potassium 3.7 3.5 - 5.1 mmol/L   Chloride 95 (L) 101 - 111 mmol/L   CO2 28 22 - 32 mmol/L   Glucose, Bld 276 (H) 65 - 99 mg/dL   BUN 33 (H) 6 - 20 mg/dL   Creatinine, Ser 1.41 (H) 0.44 - 1.00 mg/dL   Calcium 9.5 8.9 - 10.3 mg/dL   GFR calc non Af Amer 40 (L) >60 mL/min   GFR calc Af Amer 46 (L) >60 mL/min   Anion gap 11 5 - 15  CBC     Status: Abnormal   Collection Time: 08/19/15  2:18 PM  Result Value Ref Range   WBC 9.5 4.0 - 10.5 K/uL   RBC 4.46 3.87 - 5.11 MIL/uL   Hemoglobin 12.4 12.0 - 15.0 g/dL   HCT 38.3 36.0 - 46.0 %   MCV 85.9 78.0 - 100.0 fL   MCH 27.8 26.0 - 34.0 pg   MCHC 32.4 30.0 - 36.0 g/dL   RDW 16.3 (H) 11.5 - 15.5 %   Platelets 252 150 - 400 K/uL  I-stat troponin, ED (not at [facility], [facility])     Status: None   Collection Time: 08/19/15  2:25 PM  Result Value Ref Range   Troponin i, poc 0.01 0.00 - 0.08 ng/mL   Comment 3  I-Stat Troponin, ED (not at [facility], [facility])     Status: None   Collection Time: 08/19/15  3:57 PM  Result Value Ref Range   Troponin i, poc 0.00 0.00 - 0.08 ng/mL   Comment 3          APTT     Status: Abnormal   Collection Time: 08/19/15  3:59 PM  Result Value Ref Range   aPTT 69 (H) 24 - 37 seconds  Protime-INR     Status: None   Collection Time: 08/19/15  3:59 PM  Result Value Ref Range   Prothrombin Time 15.0 11.6 - 15.2 seconds   INR 1.16 0.00 - 1.49  I-Stat Troponin, ED (not at [facility])     Status: None   Collection Time: 08/19/15  6:09 PM  Result Value Ref Range   Troponin i, poc 0.01 0.00 - 0.08 ng/mL   Comment 3             Radiology/Studies: Dg Chest 2 View  08/19/2015  CLINICAL DATA:  Chest pain and nausea.  Status post right mastectomy EXAM: CHEST  2 VIEW COMPARISON:  Dec 25, 2014 FINDINGS: Mild interstitial prominence diffusely is  stable. 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 There is no edema or consolidation. The heart size and pulmonary vascularity are normal. No adenopathy. Pacemaker lead is attached to the right ventricle. Patient is status post right mastectomy. There is degenerative change in each shoulder. IMPRESSION: Stable mild diffuse interstitial prominence without edema or consolidation. 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 No new opacity. No change in cardiac silhouette. Electronically Signed   By: Lowella Grip III M.D.   On: 08/19/2015 15:09    EKG: today with atypical RBBB, sinus rhythm, QRS 140 ms.   Cardiac cath: none  Medical decision making:  Discussed care with the patient and sister on the bedside Discussed care with the ED physician on the phone Reviewed labs and imaging personally Reviewed prior records  ASSESSMENT AND PLAN:  This is a 60 y.o. 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    Active Problems:   Mixed hypercholesterolemia and hypertriglyceridemia   Obstructive sleep apnea   RESTLESS LEG SYNDROME   HYPERTENSION, BENIGN   SYSTOLIC HEART FAILURE, CHRONIC   Chest pain at rest  Atypical cardiac chest pain, possible ACS Admit for observation, cycle troponin, Serial EKGs, Unclear if prior stress test was positive for multivessel disease. Consider cardiac cath, NPO post midnight  Lipid panel, HbA1c, TSH, aspirin and lipitor 40 mg daily, echocardiogram reviewed Chronic systolic heart failure, non ischemic Currently euvolemic, last LVEF 25%, secondary pulmonary hypertension Ordered BNP, CXR reviewed (no edema), EKG showed atypical RBBB, has St. Jude's ICD in  place Continue coreg 12.5 mg BID, amiodarone 200 mg daily, digoxin 0.125 mg, lasix 20 mg daily, ivabridine 5 mg daily Hypertension essential Well controlled, Continue home meds OSA - continue CPAP at night HLD - lipid panel ordered, continue lipitor  Diabetes mellitus with circulatory disease SSI, finger sticks, No oral hypoglycemics, HbA1c GERD - PPI daily  Signed, Flossie Dibble, MD MS 08/19/2015, 7:55 PM

## 2015-08-20 LAB — CBG MONITORING, ED
GLUCOSE-CAPILLARY: 173 mg/dL — AB (ref 65–99)
Glucose-Capillary: 177 mg/dL — ABNORMAL HIGH (ref 65–99)

## 2015-08-20 LAB — GLUCOSE, CAPILLARY
GLUCOSE-CAPILLARY: 287 mg/dL — AB (ref 65–99)
Glucose-Capillary: 302 mg/dL — ABNORMAL HIGH (ref 65–99)
Glucose-Capillary: 299 mg/dL — ABNORMAL HIGH (ref 65–99)

## 2015-08-20 MED ORDER — SODIUM CHLORIDE 0.9 % IJ SOLN: 3.0000 mL | INTRAMUSCULAR | Status: DC | PRN

## 2015-08-20 MED ORDER — ASPIRIN 81 MG PO CHEW: 81.0000 mg | CHEWABLE_TABLET | ORAL | Status: DC

## 2015-08-20 MED ORDER — SODIUM CHLORIDE 0.9 % IJ SOLN
3.0000 mL | Freq: Two times a day (BID) | INTRAMUSCULAR | Status: DC
  Administered 2015-08-20: 3 mL via INTRAVENOUS

## 2015-08-20 MED ORDER — SODIUM CHLORIDE 0.9 % IV SOLN: 250.0000 mL | INTRAVENOUS | Status: DC | PRN

## 2015-08-20 MED ORDER — SODIUM CHLORIDE 0.9 % IV SOLN
INTRAVENOUS | Status: DC
  Administered 2015-08-21: 03:00:00 via INTRAVENOUS

## 2015-08-20 MED ORDER — ATORVASTATIN CALCIUM 40 MG PO TABS
40.0000 mg | ORAL_TABLET | Freq: Every day | ORAL | Status: DC
  Administered 2015-08-20 – 2015-08-21 (×2): 40 mg via ORAL
  Filled 2015-08-20 (×2): qty 1

## 2015-08-20 NOTE — Progress Notes (Signed)
Patient is on a CPAP with a pressure of 20. This is patients home settings. RT will continue to monitor.

## 2015-08-20 NOTE — ED Notes (Signed)
CBG 177. 

## 2015-08-20 NOTE — Progress Notes (Signed)
CPAP machine setup for pt per MD order.  Pt instructed on how to turn on machine once she is ready for sleep or nap.  I told pt to call with any questions or concerns.  Will continue to monitor.

## 2015-08-20 NOTE — Progress Notes (Signed)
Advanced Heart Failure Rounding Note   Subjective:     Admitted with CP. Troponins negative. Still with some chest pressure. No orthopnea or PND.    Objective:   Weight Range:  Vital Signs:   Temp:  [97.7 F (36.5 C)-98 F (36.7 C)] 97.7 F (36.5 C) (01/15 1147) Pulse Rate:  [82-99] 86 (01/15 1147) Resp:  [11-29] 17 (01/15 1147) BP: (90-125)/(48-71) 111/51 mmHg (01/15 1147) SpO2:  [91 %-99 %] 98 % (01/15 1147) Weight:  [100.109 kg (220 lb 11.2 oz)-102.195 kg (225 lb 4.8 oz)] 100.109 kg (220 lb 11.2 oz) (01/15 1147)    Weight change: Filed Weights   08/19/15 1405 08/20/15 1147  Weight: 102.195 kg (225 lb 4.8 oz) 100.109 kg (220 lb 11.2 oz)    Intake/Output:   Intake/Output Summary (Last 24 hours) at 08/20/15 1239 Last data filed at 08/19/15 2202  Gross per 24 hour  Intake      3 ml  Output      0 ml  Net      3 ml     Physical Exam: General: Obese, Well appearing. HEENT: normal  Neck: supple. JVP hard to see but dose not appear elevated, carotids 2+ bilaterally; no bruits. No lymphadenopathy or thryomegaly appreciated. Cor: PMI normal. Regular rate & rhythm. no S3. Lungs: clear Abdomen: soft, obese, nontender, no distention. No hepatosplenomegaly. No bruits or masses. Good bowel sounds. Extremities: no cyanosis, clubbing, rash, no edema Neuro: alert & orientedx3, cranial nerves grossly intact. Moves all 4 extremities w/o difficulty. Affect pleasant.  Telemetry: SR 90s  Labs: Basic Metabolic Panel:  Recent Labs Lab 08/19/15 1418 08/19/15 2035  NA 134* 137  K 3.7 3.6  CL 95* 99*  CO2 28 27  GLUCOSE 276* 159*  BUN 33* 27*  CREATININE 1.41* 1.20*  1.17*  CALCIUM 9.5 9.4    Liver Function Tests:  Recent Labs Lab 08/19/15 2035  AST 20  ALT 19  ALKPHOS 52  BILITOT 0.6  PROT 7.0  ALBUMIN 3.2*   No results for input(s): LIPASE, AMYLASE in the last 168 hours. No results for input(s): AMMONIA in the last 168 hours.  CBC:  Recent  Labs Lab 08/19/15 1418 08/19/15 2035  WBC 9.5 7.8  NEUTROABS  --  4.7  HGB 12.4 12.7  HCT 38.3 38.3  MCV 85.9 86.7  PLT 252 231    Cardiac Enzymes: No results for input(s): CKTOTAL, CKMB, CKMBINDEX, TROPONINI in the last 168 hours.  BNP: BNP (last 3 results)  Recent Labs  12/25/14 1020 08/19/15 2035  BNP 180.5* 142.7*    ProBNP (last 3 results) No results for input(s): PROBNP in the last 8760 hours.    Other results:  Imaging: Dg Chest 2 View  08/19/2015  CLINICAL DATA:  Chest pain and nausea.  Status post right mastectomy EXAM: CHEST  2 VIEW COMPARISON:  Dec 25, 2014 FINDINGS: Mild interstitial prominence diffusely is stable. 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 There is no edema or consolidation. The heart size and pulmonary vascularity are normal. No adenopathy. Pacemaker lead is attached to the right ventricle. Patient is status post right mastectomy. There is degenerative change in each shoulder. IMPRESSION: Stable mild diffuse interstitial prominence without edema or consolidation. 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 No new opacity. No change in cardiac silhouette. Electronically Signed   By: Lowella Grip III M.D.   On: 08/19/2015 15:09  Medications:     Scheduled Medications: . amiodarone  200 mg Oral Daily  . atorvastatin  40 mg Oral Daily  . busPIRone  30 mg Oral BID  . carvedilol  12.5 mg Oral BID WC  . clindamycin  300 mg Oral Daily  . digoxin  0.125 mg Oral QODAY  . DULoxetine  30 mg Oral Daily  . furosemide  20 mg Oral Daily  . heparin  5,000 Units Subcutaneous 3 times per day  . insulin aspart  0-5 Units Subcutaneous QHS  . insulin aspart  0-9 Units Subcutaneous TID WC  . ivabradine  5 mg Oral BID WC  . levothyroxine  75 mcg Oral QAC breakfast  . pantoprazole  40 mg Oral BID  . rOPINIRole  1 mg Oral QHS  . sodium chloride  3 mL Intravenous Q12H  .  tiotropium  18 mcg Inhalation Daily     Infusions: . sodium chloride 1,000 mL (08/19/15 1355)  . nitroGLYCERIN 5 mcg/min (08/19/15 1559)     PRN Medications:     Assessment:   1. Chest pain/pressure   --cath 2009 minimal CAD 2. 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 H/o breast CA s/p mastectomy and chemo 4. Left breast lump pending biopsy 5. HTN 6. OSA 7. DM2  Plan/Discussion:    Recently struggling with significant HF symptoms pending CPX this week. However now admitted with chest pain/pressure with typical and atypical features.   Will plan R/L cath tomorrow through radial/brachial approach. Can stop IV NTG.  HF volume status looks ok. BNP 142  DM2 labile. Will check HGBa1c.   I have reviewed the risks, indications, and alternatives to angioplasty and stenting with the patient. Risks include but are not limited to bleeding, infection, vascular injury, stroke, myocardial infection, arrhythmia, kidney injury, radiation-related injury in the case of prolonged fluoroscopy use, emergency cardiac surgery, and death. The patient understands the risks of serious complication is low (123456) and he agrees to proceed.    Length of Stay: 1   Ilma Achee MD 08/20/2015, 12:39 PM  Advanced Heart Failure Team Pager [phone] (M-F; [place])  Please contact [facility] for night-coverage after hours (4p -7a ) and weekends on [email or web address]

## 2015-08-20 NOTE — Progress Notes (Signed)
Patient was wearing cpap when RT entered the room. RT inspected the cpap and instructed patient to call in any assistance is needed tonight.

## 2015-08-21 ENCOUNTER — Encounter (HOSPITAL_COMMUNITY)
Admission: EM | Disposition: A | Discharge status: Home / Self Care | Payer: Medicare Other | Source: Home / Self Care | Attending: Internal Medicine

## 2015-08-21 ENCOUNTER — Inpatient Hospital Stay (HOSPITAL_COMMUNITY): Payer: Medicare Other

## 2015-08-21 DIAGNOSIS — I25110 Atherosclerotic heart disease of native coronary artery with unstable angina pectoris: Principal | ICD-10-CM

## 2015-08-21 DIAGNOSIS — I509 Heart failure, unspecified: Secondary | ICD-10-CM

## 2015-08-21 HISTORY — PX: CARDIAC CATHETERIZATION: SHX172

## 2015-08-21 LAB — CBC WITH DIFFERENTIAL/PLATELET
BASOS ABS: 0 10*3/uL (ref 0.0–0.1)
BASOS PCT: 0 %
EOS ABS: 0.3 10*3/uL (ref 0.0–0.7)
EOS PCT: 3 %
HEMATOCRIT: 36.8 % (ref 36.0–46.0)
Hemoglobin: 12.2 g/dL (ref 12.0–15.0)
Lymphocytes Relative: 27 %
Lymphs Abs: 2 10*3/uL (ref 0.7–4.0)
MCH: 28.5 pg (ref 26.0–34.0)
MCHC: 33.2 g/dL (ref 30.0–36.0)
MCV: 86 fL (ref 78.0–100.0)
MONO ABS: 0.5 10*3/uL (ref 0.1–1.0)
MONOS PCT: 7 %
NEUTROS ABS: 4.6 10*3/uL (ref 1.7–7.7)
Neutrophils Relative %: 63 %
PLATELETS: 226 10*3/uL (ref 150–400)
RBC: 4.28 MIL/uL (ref 3.87–5.11)
RDW: 16.3 % — AB (ref 11.5–15.5)
WBC: 7.4 10*3/uL (ref 4.0–10.5)

## 2015-08-21 LAB — POCT I-STAT 3, VENOUS BLOOD GAS (G3P V)
ACID-BASE EXCESS: 3 mmol/L — AB (ref 0.0–2.0)
ACID-BASE EXCESS: 4 mmol/L — AB (ref 0.0–2.0)
BICARBONATE: 27.8 meq/L — AB (ref 20.0–24.0)
BICARBONATE: 28.8 meq/L — AB (ref 20.0–24.0)
O2 SAT: 54 %
O2 Saturation: 53 %
PH VEN: 7.41 — AB (ref 7.250–7.300)
PH VEN: 7.423 — AB (ref 7.250–7.300)
PO2 VEN: 28 mmHg — AB (ref 30.0–45.0)
TCO2: 30 mmol/L (ref 0–100)
TCO2: 29 mmol/L (ref 0–100)
pCO2, Ven: 43.9 mmHg — ABNORMAL LOW (ref 45.0–50.0)
pCO2, Ven: 44.2 mmHg — ABNORMAL LOW (ref 45.0–50.0)
pO2, Ven: 28 mmHg — CL (ref 30.0–45.0)

## 2015-08-21 LAB — COMPREHENSIVE METABOLIC PANEL
ALBUMIN: 3.2 g/dL — AB (ref 3.5–5.0)
ALK PHOS: 53 U/L (ref 38–126)
ALT: 18 U/L (ref 14–54)
AST: 19 U/L (ref 15–41)
Anion gap: 9 (ref 5–15)
BILIRUBIN TOTAL: 1.1 mg/dL (ref 0.3–1.2)
BUN: 15 mg/dL (ref 6–20)
CALCIUM: 9.4 mg/dL (ref 8.9–10.3)
CO2: 28 mmol/L (ref 22–32)
CREATININE: 0.89 mg/dL (ref 0.44–1.00)
Chloride: 99 mmol/L — ABNORMAL LOW (ref 101–111)
GFR calc Af Amer: >60 mL/min (ref >60)
GFR calc non Af Amer: >60 mL/min (ref >60)
GLUCOSE: 209 mg/dL — AB (ref 65–99)
Potassium: 3.5 mmol/L (ref 3.5–5.1)
SODIUM: 136 mmol/L (ref 135–145)
TOTAL PROTEIN: 6.5 g/dL (ref 6.5–8.1)

## 2015-08-21 LAB — HEMOGLOBIN A1C
HEMOGLOBIN A1C: 8.4 % — AB (ref 4.8–5.6)
Mean Plasma Glucose: 194 mg/dL

## 2015-08-21 LAB — GLUCOSE, CAPILLARY
GLUCOSE-CAPILLARY: 286 mg/dL — AB (ref 65–99)
Glucose-Capillary: 192 mg/dL — ABNORMAL HIGH (ref 65–99)
Glucose-Capillary: 190 mg/dL — ABNORMAL HIGH (ref 65–99)
Glucose-Capillary: 158 mg/dL — ABNORMAL HIGH (ref 65–99)

## 2015-08-21 LAB — MRSA PCR SCREENING: MRSA BY PCR: NEGATIVE

## 2015-08-21 SURGERY — RIGHT/LEFT HEART CATH AND CORONARY ANGIOGRAPHY
Anesthesia: LOCAL

## 2015-08-21 MED ORDER — LIDOCAINE HCL (PF) 1 % IJ SOLN
INTRAMUSCULAR | Status: AC
Start: 2015-08-21 — End: 2015-08-21
  Filled 2015-08-21: qty 30

## 2015-08-21 MED ORDER — LIDOCAINE HCL (PF) 1 % IJ SOLN
INTRAMUSCULAR | Status: AC
  Filled 2015-08-21: qty 30

## 2015-08-21 MED ORDER — MIDAZOLAM HCL 2 MG/2ML IJ SOLN
INTRAMUSCULAR | Status: DC | PRN
  Administered 2015-08-21: 1 mg via INTRAVENOUS

## 2015-08-21 MED ORDER — SODIUM CHLORIDE 0.9 % IV SOLN: 250.0000 mL | INTRAVENOUS | Status: DC | PRN

## 2015-08-21 MED ORDER — SODIUM CHLORIDE 0.9 % IJ SOLN: 3.0000 mL | INTRAMUSCULAR | Status: DC | PRN

## 2015-08-21 MED ORDER — LIDOCAINE HCL (PF) 1 % IJ SOLN
INTRAMUSCULAR | Status: DC | PRN
  Administered 2015-08-21: 17:00:00

## 2015-08-21 MED ORDER — PERFLUTREN LIPID MICROSPHERE
1.0000 mL | INTRAVENOUS | Status: AC | PRN
  Filled 2015-08-21: qty 10

## 2015-08-21 MED ORDER — HEPARIN SODIUM (PORCINE) 1000 UNIT/ML IJ SOLN
INTRAMUSCULAR | Status: AC
  Filled 2015-08-21: qty 1

## 2015-08-21 MED ORDER — FUROSEMIDE 10 MG/ML IJ SOLN
40.0000 mg | Freq: Once | INTRAMUSCULAR | Status: AC
  Administered 2015-08-21: 40 mg via INTRAVENOUS
  Filled 2015-08-21: qty 4

## 2015-08-21 MED ORDER — HEPARIN (PORCINE) IN NACL 2-0.9 UNIT/ML-% IJ SOLN
INTRAMUSCULAR | Status: AC
  Filled 2015-08-21: qty 1000

## 2015-08-21 MED ORDER — ACETAMINOPHEN 325 MG PO TABS
650.0000 mg | ORAL_TABLET | ORAL | Status: DC | PRN
  Administered 2015-08-21: 650 mg via ORAL
  Filled 2015-08-21: qty 2

## 2015-08-21 MED ORDER — FENTANYL CITRATE (PF) 100 MCG/2ML IJ SOLN
INTRAMUSCULAR | Status: AC
  Filled 2015-08-21: qty 2

## 2015-08-21 MED ORDER — ONDANSETRON HCL 4 MG/2ML IJ SOLN: 4.0000 mg | Freq: Four times a day (QID) | INTRAMUSCULAR | Status: DC | PRN

## 2015-08-21 MED ORDER — SODIUM CHLORIDE 0.9 % IV SOLN: INTRAVENOUS | Status: AC

## 2015-08-21 MED ORDER — IOHEXOL 350 MG/ML SOLN
INTRAVENOUS | Status: DC | PRN
  Administered 2015-08-21: 70 mL via INTRA_ARTERIAL

## 2015-08-21 MED ORDER — SODIUM CHLORIDE 0.9 % IJ SOLN
3.0000 mL | Freq: Two times a day (BID) | INTRAMUSCULAR | Status: DC
  Administered 2015-08-21: 3 mL via INTRAVENOUS

## 2015-08-21 MED ORDER — HEPARIN SODIUM (PORCINE) 5000 UNIT/ML IJ SOLN: 5000.0000 [IU] | Freq: Three times a day (TID) | INTRAMUSCULAR | Status: DC

## 2015-08-21 MED ORDER — VERAPAMIL HCL 2.5 MG/ML IV SOLN
INTRAVENOUS | Status: AC
  Filled 2015-08-21: qty 2

## 2015-08-21 MED ORDER — FENTANYL CITRATE (PF) 100 MCG/2ML IJ SOLN
INTRAMUSCULAR | Status: DC | PRN
  Administered 2015-08-21: 25 ug via INTRAVENOUS

## 2015-08-21 MED ORDER — POTASSIUM CHLORIDE CRYS ER 20 MEQ PO TBCR: 40.0000 meq | EXTENDED_RELEASE_TABLET | Freq: Once | ORAL | Status: DC

## 2015-08-21 MED ORDER — MIDAZOLAM HCL 2 MG/2ML IJ SOLN
INTRAMUSCULAR | Status: AC
  Filled 2015-08-21: qty 2

## 2015-08-21 SURGICAL SUPPLY — 16 items
CATH BALLN WEDGE 5F 110CM (CATHETERS) ×2 IMPLANT
CATH INFINITI 5 FR 3DRC (CATHETERS) ×1 IMPLANT
CATH INFINITI 5FR ANG PIGTAIL (CATHETERS) ×2 IMPLANT
CATH INFINITI JR4 5F (CATHETERS) ×2 IMPLANT
DEVICE RAD COMP TR BAND LRG (VASCULAR PRODUCTS) ×2 IMPLANT
GLIDESHEATH SLEND SS 6F .021 (SHEATH) ×2 IMPLANT
GUIDEWIRE .025 260CM (WIRE) ×1 IMPLANT
KIT HEART LEFT (KITS) ×2 IMPLANT
PACK CARDIAC CATHETERIZATION (CUSTOM PROCEDURE TRAY) ×2 IMPLANT
SHEATH FAST CATH BRACH 5F 5CM (SHEATH) ×2 IMPLANT
SHEATH PINNACLE 5F 10CM (SHEATH) ×1 IMPLANT
SYR MEDRAD MARK V 150ML (SYRINGE) ×2 IMPLANT
TRANSDUCER W/STOPCOCK (MISCELLANEOUS) ×3 IMPLANT
TUBING CIL FLEX 10 FLL-RA (TUBING) ×2 IMPLANT
WIRE EMERALD 3MM-J .035X150CM (WIRE) ×4 IMPLANT
WIRE SAFE-T 1.5MM-J .035X260CM (WIRE) ×2 IMPLANT

## 2015-08-21 NOTE — H&P (View-Only) (Signed)
Advanced Heart Failure Rounding Note   Subjective:    Still with occasional chest pressure.   Objective:   Weight Range:  Vital Signs:   Temp:  [97.7 F (36.5 C)-98 F (36.7 C)] 97.7 F (36.5 C) (01/16 0323) Pulse Rate:  [81-99] 89 (01/16 0323) Resp:  [16-23] 18 (01/16 0323) BP: (101-130)/(46-68) 112/60 mmHg (01/16 0323) SpO2:  [92 %-100 %] 96 % (01/16 0800) Weight:  [100.109 kg (220 lb 11.2 oz)-100.3 kg (221 lb 1.9 oz)] 100.3 kg (221 lb 1.9 oz) (01/16 0323) Last BM Date: 08/16/15  Weight change: Filed Weights   08/19/15 1405 08/20/15 1147 08/21/15 0323  Weight: 102.195 kg (225 lb 4.8 oz) 100.109 kg (220 lb 11.2 oz) 100.3 kg (221 lb 1.9 oz)    Intake/Output:  No intake or output data in the 24 hours ending 08/21/15 0841   Physical Exam: General: Obese, Well appearing. HEENT: normal  Neck: supple. JVP hard to see but dose not appear elevated, carotids 2+ bilaterally; no bruits. No lymphadenopathy or thryomegaly appreciated. Cor: PMI normal. Regular rate & rhythm. no S3. Lungs: clear Abdomen: soft, obese, nontender, no distention. No hepatosplenomegaly. No bruits or masses. Good bowel sounds. Extremities: no cyanosis, clubbing, rash, no edema Neuro: alert & orientedx3, cranial nerves grossly intact. Moves all 4 extremities w/o difficulty. Affect pleasant.  Telemetry: SR 90s  Labs: Basic Metabolic Panel:  Recent Labs Lab 08/19/15 1418 08/19/15 2035 08/21/15 0530  NA 134* 137 136  K 3.7 3.6 3.5  CL 95* 99* 99*  CO2 28 27 28   GLUCOSE 276* 159* 209*  BUN 33* 27* 15  CREATININE 1.41* 1.20*  1.17* 0.89  CALCIUM 9.5 9.4 9.4    Liver Function Tests:  Recent Labs Lab 08/19/15 2035 08/21/15 0530  AST 20 19  ALT 19 18  ALKPHOS 52 53  BILITOT 0.6 1.1  PROT 7.0 6.5  ALBUMIN 3.2* 3.2*   No results for input(s): LIPASE, AMYLASE in the last 168 hours. No results for input(s): AMMONIA in the last 168 hours.  CBC:  Recent Labs Lab 08/19/15 1418  08/19/15 2035 08/21/15 0530  WBC 9.5 7.8 7.4  NEUTROABS  --  4.7 4.6  HGB 12.4 12.7 12.2  HCT 38.3 38.3 36.8  MCV 85.9 86.7 86.0  PLT 252 231 226    Cardiac Enzymes: No results for input(s): CKTOTAL, CKMB, CKMBINDEX, TROPONINI in the last 168 hours.  BNP: BNP (last 3 results)  Recent Labs  12/25/14 1020 08/19/15 2035  BNP 180.5* 142.7*    ProBNP (last 3 results) No results for input(s): PROBNP in the last 8760 hours.    Other results:  Imaging: Dg Chest 2 View  08/19/2015  CLINICAL DATA:  Chest pain and nausea.  Status post right mastectomy EXAM: CHEST  2 VIEW COMPARISON:  Dec 25, 2014 FINDINGS: Mild interstitial prominence diffusely is stable. 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 There is no edema or consolidation. The heart size and pulmonary vascularity are normal. No adenopathy. Pacemaker lead is attached to the right ventricle. Patient is status post right mastectomy. There is degenerative change in each shoulder. IMPRESSION: Stable mild diffuse interstitial prominence without edema or consolidation. 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 No new opacity. No change in cardiac silhouette. Electronically Signed   By: Lowella Grip III M.D.   On: 08/19/2015 15:09     Medications:     Scheduled Medications: . amiodarone  200 mg Oral Daily  . atorvastatin  40 mg Oral QHS  . busPIRone  30 mg Oral BID  . carvedilol  12.5 mg Oral BID WC  . clindamycin  300 mg Oral Daily  . digoxin  0.125 mg Oral QODAY  . DULoxetine  30 mg Oral Daily  . furosemide  20 mg Oral Daily  . heparin  5,000 Units Subcutaneous 3 times per day  . insulin aspart  0-5 Units Subcutaneous QHS  . insulin aspart  0-9 Units Subcutaneous TID WC  . ivabradine  5 mg Oral BID WC  . levothyroxine  75 mcg Oral QAC breakfast  . pantoprazole  40 mg Oral BID  . rOPINIRole  1 mg Oral QHS  . sodium chloride  3 mL  Intravenous Q12H  . sodium chloride  3 mL Intravenous Q12H  . tiotropium  18 mcg Inhalation Daily    Infusions: . sodium chloride 1,000 mL (08/19/15 1355)  . sodium chloride 50 mL/hr at 08/21/15 0319    PRN Medications:     Assessment:   1. Chest pain/pressure   --cath 2009 minimal CAD 2. 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 H/o breast CA s/p mastectomy and chemo 4. Left breast lump pending biopsy 5. HTN 6. OSA 7. DM2 8. Hypokalemia  Plan/Discussion:    Has ruled out. Volume status looks ok. For R/L heart cath today. Supp K+   Length of Stay: 2   Dusty Raczkowski MD 08/21/2015, 8:41 AM  Advanced Heart Failure Team Pager [phone] (M-F; 7a - 4p)  Please contact [facility] for night-coverage after hours (4p -7a ) and weekends on [email or web address]

## 2015-08-21 NOTE — Progress Notes (Signed)
Inpatient Diabetes Program Recommendations  AACE/ADA: New Consensus Statement on Inpatient Glycemic Control (2015)  Target Ranges:  Prepandial:   less than 140 mg/dL      Peak postprandial:   less than 180 mg/dL (1-2 hours)      Critically ill patients:  140 - 180 mg/dL   Results for JANITZA, OVERBY (MRN [id number]) as of 08/21/2015 12:00  Ref. Range 08/20/2015 07:17 08/20/2015 09:23 08/20/2015 11:45 08/20/2015 16:14 08/20/2015 21:13 08/21/2015 06:11 08/21/2015 11:27  Glucose-Capillary Latest Ref Range: 65-99 mg/dL 173 (H) 177 (H) 302 (H) 299 (H) 287 (H) 192 (H) 190 (H)   Review of Glycemic Control  Diabetes history: DM 2 Outpatient Diabetes medications: Tresiba 160 units QAM, Humalog 20 units Daily at supper Current orders for Inpatient glycemic control: Novolog Sensitive TID  Inpatient Diabetes Program Recommendations: Insulin - Basal: Patient takes [place] basal insulin at home. While inpatient, please consider placing patient on 0.2 units/kg, Lantus 20 units Q24hrs.  Note patient NPO today for cath, patient 192/190 today still needs the Novolog Correction. No insulin given today. Will watch trends while inpatient.  Thanks,  Tama Headings RN, MSN, [facility] Inpatient Diabetes Coordinator Team Pager [phone] (8a-5p)

## 2015-08-21 NOTE — Progress Notes (Signed)
  Echocardiogram 2D Echocardiogram with Definity  has been performed.  [name] 08/21/2015, 10:01 AM

## 2015-08-21 NOTE — Interval H&P Note (Signed)
History and Physical Interval Note:  08/21/2015 3:17 PM  [name]  has presented today for surgery, with the diagnosis of unstable angina  The various methods of treatment have been discussed with the patient and family. After consideration of risks, benefits and other options for treatment, the patient has consented to  Procedure(s): Right/Left Heart Cath and Coronary Angiography (N/A) and possible angioplasty as a surgical intervention .  The patient's history has been reviewed, patient examined, no change in status, stable for surgery.  I have reviewed the patient's chart and labs.  Questions were answered to the patient's satisfaction.     Bensimhon, Quillian Quince

## 2015-08-21 NOTE — Progress Notes (Signed)
RT filled patient CPAP up with water. Patient placed her CPAP mask on with setting of 20. This is her home setting. Patient tolerating well. RT will continue to monitor as needed.

## 2015-08-21 NOTE — Progress Notes (Addendum)
Advanced Heart Failure Rounding Note   Subjective:    Still with occasional chest pressure.   Objective:   Weight Range:  Vital Signs:   Temp:  [97.7 F (36.5 C)-98 F (36.7 C)] 97.7 F (36.5 C) (01/16 0323) Pulse Rate:  [81-99] 89 (01/16 0323) Resp:  [16-23] 18 (01/16 0323) BP: (101-130)/(46-68) 112/60 mmHg (01/16 0323) SpO2:  [92 %-100 %] 96 % (01/16 0800) Weight:  [100.109 kg (220 lb 11.2 oz)-100.3 kg (221 lb 1.9 oz)] 100.3 kg (221 lb 1.9 oz) (01/16 0323) Last BM Date: 08/16/15  Weight change: Filed Weights   08/19/15 1405 08/20/15 1147 08/21/15 0323  Weight: 102.195 kg (225 lb 4.8 oz) 100.109 kg (220 lb 11.2 oz) 100.3 kg (221 lb 1.9 oz)    Intake/Output:  No intake or output data in the 24 hours ending 08/21/15 0841   Physical Exam: General: Obese, Well appearing. HEENT: normal  Neck: supple. JVP hard to see but dose not appear elevated, carotids 2+ bilaterally; no bruits. No lymphadenopathy or thryomegaly appreciated. Cor: PMI normal. Regular rate & rhythm. no S3. Lungs: clear Abdomen: soft, obese, nontender, no distention. No hepatosplenomegaly. No bruits or masses. Good bowel sounds. Extremities: no cyanosis, clubbing, rash, no edema Neuro: alert & orientedx3, cranial nerves grossly intact. Moves all 4 extremities w/o difficulty. Affect pleasant.  Telemetry: SR 90s  Labs: Basic Metabolic Panel:  Recent Labs Lab 08/19/15 1418 08/19/15 2035 08/21/15 0530  NA 134* 137 136  K 3.7 3.6 3.5  CL 95* 99* 99*  CO2 28 27 28   GLUCOSE 276* 159* 209*  BUN 33* 27* 15  CREATININE 1.41* 1.20*  1.17* 0.89  CALCIUM 9.5 9.4 9.4    Liver Function Tests:  Recent Labs Lab 08/19/15 2035 08/21/15 0530  AST 20 19  ALT 19 18  ALKPHOS 52 53  BILITOT 0.6 1.1  PROT 7.0 6.5  ALBUMIN 3.2* 3.2*   No results for input(s): LIPASE, AMYLASE in the last 168 hours. No results for input(s): AMMONIA in the last 168 hours.  CBC:  Recent Labs Lab 08/19/15 1418  08/19/15 2035 08/21/15 0530  WBC 9.5 7.8 7.4  NEUTROABS  --  4.7 4.6  HGB 12.4 12.7 12.2  HCT 38.3 38.3 36.8  MCV 85.9 86.7 86.0  PLT 252 231 226    Cardiac Enzymes: No results for input(s): CKTOTAL, CKMB, CKMBINDEX, TROPONINI in the last 168 hours.  BNP: BNP (last 3 results)  Recent Labs  12/25/14 1020 08/19/15 2035  BNP 180.5* 142.7*    ProBNP (last 3 results) No results for input(s): PROBNP in the last 8760 hours.    Other results:  Imaging: Dg Chest 2 View  08/19/2015  CLINICAL DATA:  Chest pain and nausea.  Status post right mastectomy EXAM: CHEST  2 VIEW COMPARISON:  Dec 25, 2014 FINDINGS: Mild interstitial prominence diffusely is stable. 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 There is no edema or consolidation. The heart size and pulmonary vascularity are normal. No adenopathy. Pacemaker lead is attached to the right ventricle. Patient is status post right mastectomy. There is degenerative change in each shoulder. IMPRESSION: Stable mild diffuse interstitial prominence without edema or consolidation. 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 No new opacity. No change in cardiac silhouette. Electronically Signed   By: Lowella Grip III M.D.   On: 08/19/2015 15:09     Medications:     Scheduled Medications: . amiodarone  200 mg Oral Daily  . atorvastatin  40 mg Oral QHS  . busPIRone  30 mg Oral BID  . carvedilol  12.5 mg Oral BID WC  . clindamycin  300 mg Oral Daily  . digoxin  0.125 mg Oral QODAY  . DULoxetine  30 mg Oral Daily  . furosemide  20 mg Oral Daily  . heparin  5,000 Units Subcutaneous 3 times per day  . insulin aspart  0-5 Units Subcutaneous QHS  . insulin aspart  0-9 Units Subcutaneous TID WC  . ivabradine  5 mg Oral BID WC  . levothyroxine  75 mcg Oral QAC breakfast  . pantoprazole  40 mg Oral BID  . rOPINIRole  1 mg Oral QHS  . sodium chloride  3 mL  Intravenous Q12H  . sodium chloride  3 mL Intravenous Q12H  . tiotropium  18 mcg Inhalation Daily    Infusions: . sodium chloride 1,000 mL (08/19/15 1355)  . sodium chloride 50 mL/hr at 08/21/15 0319    PRN Medications:     Assessment:   1. Chest pain/pressure   --cath 2009 minimal CAD 2. 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 H/o breast CA s/p mastectomy and chemo 4. Left breast lump pending biopsy 5. HTN 6. OSA 7. DM2 8. Hypokalemia  Plan/Discussion:    Has ruled out. Volume status looks ok. For R/L heart cath today. Supp K+   Length of Stay: 2   Parish Augustine MD 08/21/2015, 8:41 AM  Advanced Heart Failure Team Pager [phone] (M-F; 7a - 4p)  Please contact [facility] for night-coverage after hours (4p -7a ) and weekends on [email or web address]

## 2015-08-21 NOTE — Progress Notes (Signed)
Site area: rt brachial venous sheath pulled and pressure held by [facility] Site Prior to Removal:  Level  0 Pressure Applied For:  10 minutes Manual:   yes Patient Status During Pull:  stable Post Pull Site:  Level  0 Post Pull Instructions Given:  yes Post Pull Pulses Present: radial palpable Dressing Applied:  Paper tape Bedrest begins @  Comments:

## 2015-08-21 NOTE — Progress Notes (Signed)
Site area: rt groin fa sheath Site Prior to Removal:  Level  0 Pressure Applied For:  20 minutes Manual:    yes Patient Status During Pull:  stable Post Pull Site:  Level  0 Post Pull Instructions Given:  yes Post Pull Pulses Present: yes Dressing Applied:  Paper tape Bedrest begins @ [id number] Comments:

## 2015-08-21 NOTE — Progress Notes (Signed)
Utilization review completed.  

## 2015-08-22 ENCOUNTER — Encounter (HOSPITAL_COMMUNITY): Payer: Self-pay | Admitting: Internal Medicine

## 2015-08-22 LAB — CBC WITH DIFFERENTIAL/PLATELET
BASOS ABS: 0 10*3/uL (ref 0.0–0.1)
Basophils Relative: 0 %
Eosinophils Absolute: 0.2 10*3/uL (ref 0.0–0.7)
Eosinophils Relative: 2 %
HEMATOCRIT: 34.9 % — AB (ref 36.0–46.0)
Hemoglobin: 11.6 g/dL — ABNORMAL LOW (ref 12.0–15.0)
LYMPHS ABS: 1.8 10*3/uL (ref 0.7–4.0)
LYMPHS PCT: 20 %
MCH: 28.7 pg (ref 26.0–34.0)
MCHC: 33.2 g/dL (ref 30.0–36.0)
MCV: 86.4 fL (ref 78.0–100.0)
MONO ABS: 0.7 10*3/uL (ref 0.1–1.0)
Monocytes Relative: 7 %
NEUTROS ABS: 6.4 10*3/uL (ref 1.7–7.7)
Neutrophils Relative %: 71 %
Platelets: 211 10*3/uL (ref 150–400)
RBC: 4.04 MIL/uL (ref 3.87–5.11)
RDW: 16.3 % — ABNORMAL HIGH (ref 11.5–15.5)
WBC: 9.1 10*3/uL (ref 4.0–10.5)

## 2015-08-22 LAB — GLUCOSE, CAPILLARY
GLUCOSE-CAPILLARY: 223 mg/dL — AB (ref 65–99)
Glucose-Capillary: 174 mg/dL — ABNORMAL HIGH (ref 65–99)

## 2015-08-22 LAB — COMPREHENSIVE METABOLIC PANEL
ALK PHOS: 51 U/L (ref 38–126)
ALT: 17 U/L (ref 14–54)
ANION GAP: 9 (ref 5–15)
AST: 18 U/L (ref 15–41)
Albumin: 2.9 g/dL — ABNORMAL LOW (ref 3.5–5.0)
BILIRUBIN TOTAL: 1.2 mg/dL (ref 0.3–1.2)
BUN: 13 mg/dL (ref 6–20)
CHLORIDE: 97 mmol/L — AB (ref 101–111)
CO2: 30 mmol/L (ref 22–32)
Calcium: 9.3 mg/dL (ref 8.9–10.3)
Creatinine, Ser: 0.78 mg/dL (ref 0.44–1.00)
GFR calc Af Amer: >60 mL/min (ref >60)
GFR calc non Af Amer: >60 mL/min (ref >60)
GLUCOSE: 203 mg/dL — AB (ref 65–99)
POTASSIUM: 3.5 mmol/L (ref 3.5–5.1)
Sodium: 136 mmol/L (ref 135–145)
TOTAL PROTEIN: 6.2 g/dL — AB (ref 6.5–8.1)

## 2015-08-22 MED ORDER — CARVEDILOL 6.25 MG PO TABS
6.2500 mg | ORAL_TABLET | Freq: Two times a day (BID) | ORAL | Status: DC
Start: 2015-08-22 — End: 2015-08-22

## 2015-08-22 MED ORDER — CARVEDILOL 12.5 MG PO TABS: 6.2500 mg | ORAL_TABLET | Freq: Two times a day (BID) | ORAL | Status: DC

## 2015-08-22 MED ORDER — RAMIPRIL 5 MG PO CAPS: 5.0000 mg | ORAL_CAPSULE | Freq: Every day | ORAL | Status: DC

## 2015-08-22 MED ORDER — FUROSEMIDE 40 MG PO TABS: 40.0000 mg | ORAL_TABLET | Freq: Two times a day (BID) | ORAL | Status: DC

## 2015-08-22 MED ORDER — FUROSEMIDE 20 MG PO TABS: ORAL_TABLET | ORAL | Status: DC

## 2015-08-22 MED ORDER — POTASSIUM CHLORIDE CRYS ER 20 MEQ PO TBCR: 40.0000 meq | EXTENDED_RELEASE_TABLET | Freq: Two times a day (BID) | ORAL | Status: DC

## 2015-08-22 MED FILL — Lidocaine HCl Local Preservative Free (PF) Inj 1%: INTRAMUSCULAR | Qty: 30 | Status: AC

## 2015-08-22 MED FILL — Verapamil HCl IV Soln 2.5 MG/ML: INTRAVENOUS | Qty: 2 | Status: AC

## 2015-08-22 MED FILL — Perflutren Lipid Microsphere IV Susp 1.1 MG/ML: INTRAVENOUS | Qty: 10 | Status: AC

## 2015-08-22 NOTE — Care Management Important Message (Signed)
Important Message  Patient Details  Name: [name] MRN: [id number] Date of Birth: Nov 27, 1955   Medicare Important Message Given:  Yes    Nathen May 08/22/2015, 11:49 AM

## 2015-08-22 NOTE — Discharge Summary (Signed)
Advanced Heart Failure Team  Discharge Summary   Patient ID: [name] MRN: [id number], DOB/AGE: September 18, 1955 60 y.o. Admit date: 08/19/2015 D/C date:     08/22/2015   Primary Discharge Diagnoses:  1. Chest pain/pressure - resolved. LHC with mild, nonobstructive CAD 2. 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 H/o breast CA s/p mastectomy and chemo . S/P R mastectomy 2008  4. Left breast lump - pending biopsy. Told to reschedule ASAP. 5. HTN - Stable on current meds 6. OSA - Uses CPAP nightly 7. DM2 - Labile. Recommended she follow up with her PCP. Hgb A1C 8.4 08/19/15 8. Hypokalemia - Resolved.   Hospital Course:  [name] is a 60 y.o. female with h/o Breast CA s/p mastectomy, chemo, and R mastectomy 2008, obesity, HTN, DM, IBS, HLD, OSA, chemo induced CM x/p ICD placement, and chronic systolic HF echo [id number]  EF 20% presented 08/19/15 with CP radiating into her L arm. EKG suggestive of inferior ST elevation, though similar to previous EKGs. Code STEMI was called on patient but nonobstructive disease with prior cath and persistent ECG changes with no change from previous led to medical management. Treated with IV NTG. Cyclic troponins came back negative.   She had recently been having trouble managing her HF sxs as an outpatient and had a CPX scheduled for this week. With admission, HF team consulted, and decided to pursue [facility] 08/21/15, full results as below. They showed very mild CAD with mild/mod increased filling pressures, severe LV dysfunction due to NICM EF 20%. Echo 08/21/15, however, showed LVEF 30-35%. She was diuresed gently overnight. She continued to have intermittent, non-specific CP that improved, and she denied any SOB or CP on day of discharge.  Her I/Os were recorded very poorly, but she was down 5 lbs from admission. She will be discharged to home in stable condition with close follow up as below. Coreg and ramipril chronic doses  decreased for home with hypotension.  We have told her to reschedule her breast biopsy ASAP. We will reschedule CPX for 1-2 weeks as her schedule allows.    R/LHC 08/21/15 Left: Mid Cx lesion, 30% stenosed. 3rd Mrg lesion, 30% stenosed. Right: RA = 12 RV = 55/6/12 PA = 54/21 (34) PCW = 22 Ao 93/58 (71) LV 106/8/26 Fick cardiac output/index = 4.2/2.1 PVR = 2.8 SVR = 1115 FA sat = 93% PA sat = 54%, 53%  Echo 08/21/15 LVEF 30-35%, restrictive doppler parameters, diffuse hypokinesis, mild MR. Normal RV.  Discharge Weight Range: 220 lb Discharge Vitals: Blood pressure 94/53, pulse 85, temperature 98.1 F (36.7 C), temperature source Oral, resp. rate 18, height 5\' 5"  (1.651 m), weight 220 lb 9.6 oz (100.064 kg), SpO2 97 %.  Labs: Lab Results  Component Value Date   WBC 9.1 08/22/2015   HGB 11.6* 08/22/2015   HCT 34.9* 08/22/2015   MCV 86.4 08/22/2015   PLT 211 08/22/2015     Recent Labs Lab 08/22/15 0225  NA 136  K 3.5  CL 97*  CO2 30  BUN 13  CREATININE 0.78  CALCIUM 9.3  PROT 6.2*  BILITOT 1.2  ALKPHOS 51  ALT 17  AST 18  GLUCOSE 203*   Lab Results  Component Value Date   CHOL 147 08/19/2015   HDL 37* 08/19/2015   LDLCALC 51 08/19/2015   TRIG 297* 08/19/2015   BNP (last 3 results)  Recent Labs  12/25/14 1020 08/19/15 2035  BNP 180.5*  142.7*    ProBNP (last 3 results) No results for input(s): PROBNP in the last 8760 hours.   Diagnostic Studies/Procedures   No results found.  Discharge Medications     Medication List    STOP taking these medications        clindamycin 300 MG capsule  Commonly known as:  CLEOCIN      TAKE these medications        amiodarone 200 MG tablet  Commonly known as:  PACERONE  Take 1 tablet (200 mg total) by mouth daily.     atorvastatin 40 MG tablet  Commonly known as:  LIPITOR  Take 40 mg by mouth daily.     busPIRone 30 MG tablet  Commonly known as:  BUSPAR  Take 30 mg by mouth 2 (two) times daily.      carvedilol 12.5 MG tablet  Commonly known as:  COREG  Take 0.5 tablets (6.25 mg total) by mouth 2 (two) times daily with a meal.     digoxin 0.125 MG tablet  Commonly known as:  LANOXIN  Take 1 tablet (0.125 mg total) by mouth every other day.     DULoxetine 30 MG capsule  Commonly known as:  CYMBALTA  Take 30 mg by mouth daily.     furosemide 20 MG tablet  Commonly known as:  LASIX  40 mg twice a day     Garlic 123XX123 MG Caps  Take 1 capsule by mouth daily. Reported on 08/19/2015     Glucosamine 500 MG Caps  Take 1 capsule by mouth daily.     insulin lispro 100 UNIT/ML injection  Commonly known as:  HUMALOG  Inject 20 Units into the skin daily with supper.     Insulin Pen Needle 31G X 8 MM Misc  Commonly known as:  B-D ULTRAFINE III SHORT PEN  18/day as directed dx 250.00     ivabradine 5 MG Tabs tablet  Commonly known as:  CORLANOR  Take 1 tablet (5 mg total) by mouth 2 (two) times daily with a meal.     levothyroxine 75 MCG tablet  Commonly known as:  SYNTHROID, LEVOTHROID  Take 75 mcg by mouth daily before breakfast.     metolazone 2.5 MG tablet  Commonly known as:  ZAROXOLYN  Take 2.5 mg as needed for weight gain.     mometasone 50 MCG/ACT nasal spray  Commonly known as:  NASONEX  Place 1 spray into both nostrils daily. allergies     nitroGLYCERIN 0.4 MG SL tablet  Commonly known as:  NITROSTAT  Place 1 tablet (0.4 mg total) under the tongue every 5 (five) minutes as needed. For chest pain     omega-3 acid ethyl esters 1 g capsule  Commonly known as:  LOVAZA  Take 2 g by mouth 2 (two) times daily.     pantoprazole 40 MG tablet  Commonly known as:  PROTONIX  Take 40 mg by mouth 2 (two) times daily.     potassium chloride SA 20 MEQ tablet  Commonly known as:  K-DUR,KLOR-CON  Take 2 tablets (40 mEq total) by mouth 2 (two) times daily.     PROVENTIL HFA 108 (90 Base) MCG/ACT inhaler  Generic drug:  albuterol  Inhale 2 puffs into the lungs every 6 (six)  hours as needed. For shortness of breath     ramipril 5 MG capsule  Commonly known as:  ALTACE  Take 1 capsule (5 mg total) by mouth at bedtime.  rOPINIRole 1 MG tablet  Commonly known as:  REQUIP  Take 1 mg by mouth at bedtime.     spironolactone 25 MG tablet  Commonly known as:  ALDACTONE  TAKE 1 TABLET BY MOUTH DAILY     SUMAtriptan 25 MG tablet  Commonly known as:  IMITREX  Take 25 mg by mouth every 2 (two) hours as needed. For migraine     tiotropium 18 MCG inhalation capsule  Commonly known as:  SPIRIVA  Place 18 mcg into inhaler and inhale daily.     TRESIBA FLEXTOUCH 200 UNIT/ML Sopn  Generic drug:  Insulin Degludec  Inject 160 Units into the skin every morning.     TYLENOL ARTHRITIS PAIN 650 MG CR tablet  Generic drug:  acetaminophen  Take 1,300 mg by mouth every 8 (eight) hours as needed. pain     vitamin B-12 1000 MCG tablet  Commonly known as:  CYANOCOBALAMIN  Take 1,000 mcg by mouth daily.     vitamin C 500 MG tablet  Commonly known as:  ASCORBIC ACID  Take 500 mg by mouth daily.     vitamin E 400 UNIT capsule  Take 400 Units by mouth daily.        Disposition   The patient will be discharged in stable condition to home. Discharge Instructions    Diet - low sodium heart healthy    Complete by:  As directed      Heart Failure patients record your daily weight using the same scale at the same time of day    Complete by:  As directed      Increase activity slowly    Complete by:  As directed           Follow-up Information    Follow up with Darrick Grinder, NP On 09/05/2015.   Specialty:  Cardiology   Why:  at [place] [address]   Contact information:   [address] [place] [address] [phone]         Duration of Discharge Encounter: Greater than 35 minutes   Signed, Shirley Friar PA-C 08/22/2015, 2:36 PM   Patient seen and examined with Oda Kilts, PA-C. We discussed all aspects of the encounter. I agree with  the assessment and plan as stated above.   She is stable. Results of cath reviewed. Volume status up due to volume loading pre cath. BP low. Agree with cutting back ramipril and carvedilol for now. Can go home today with close f/u. Will need breast biopsy. Plan CPX in several weeks.   Arlene Brickel,MD 4:59 PM

## 2015-08-22 NOTE — Progress Notes (Signed)
Advanced Heart Failure Rounding Note   Subjective:    Yesterday she continued to diurese with IV lasix. Weight down 1 pound. I/O not accurate.   Denies SOB/CP. Concerned about the timing of breast biopsy.   RHC/LCH 08/20/2014   Mid Cx lesion, 30% stenosed.  3rd Mrg lesion, 30% stenosed.  Findings:  RA = 12 RV = 55/6/12 PA = 54/21 (34) PCW = 22 Ao 93/58 (71) LV 106/8/26 Fick cardiac output/index = 4.2/2.1 PVR = 2.8 SVR = 1115 FA sat = 93% PA sat = 54%, 53%  Assessment: 1. Very mild CAD 2. Mild to moderately increased filling pressures 3. Severe LV dysfunction due to NICM EF 20%   Objective:   Weight Range:  Vital Signs:   Temp:  [97.9 F (36.6 C)-98.1 F (36.7 C)] 98.1 F (36.7 C) (01/17 0511) Pulse Rate:  [0-96] 85 (01/17 0511) Resp:  [0-23] 18 (01/17 0511) BP: (94-120)/(47-73) 94/53 mmHg (01/17 0511) SpO2:  [0 %-98 %] 97 % (01/17 0511) Weight:  [220 lb 9.6 oz (100.064 kg)] 220 lb 9.6 oz (100.064 kg) (01/17 0511) Last BM Date: 08/21/15  Weight change: Filed Weights   08/20/15 1147 08/21/15 0323 08/22/15 0511  Weight: 220 lb 11.2 oz (100.109 kg) 221 lb 1.9 oz (100.3 kg) 220 lb 9.6 oz (100.064 kg)    Intake/Output:   Intake/Output Summary (Last 24 hours) at 08/22/15 0837 Last data filed at 08/21/15 1351  Gross per 24 hour  Intake      0 ml  Output      0 ml  Net      0 ml     Physical Exam: General: Obese, Well appearing. Sitting in the chair.  HEENT: normal  Neck: supple. JVP hard to see but dose not appear elevated, carotids 2+ bilaterally; no bruits. No lymphadenopathy or thryomegaly appreciated. Cor: PMI normal. Regular rate & rhythm. no S3. Lungs: clear Abdomen: soft, obese, nontender, no distention. No hepatosplenomegaly. No bruits or masses. Good bowel sounds. Extremities: no cyanosis, clubbing, rash, no edema Neuro: alert & orientedx3, cranial nerves grossly intact. Moves all 4 extremities w/o difficulty. Affect  pleasant.  Telemetry: SR 90s  Labs: Basic Metabolic Panel:  Recent Labs Lab 08/19/15 1418 08/19/15 2035 08/21/15 0530 08/22/15 0225  NA 134* 137 136 136  K 3.7 3.6 3.5 3.5  CL 95* 99* 99* 97*  CO2 28 27 28 30   GLUCOSE 276* 159* 209* 203*  BUN 33* 27* 15 13  CREATININE 1.41* 1.20*  1.17* 0.89 0.78  CALCIUM 9.5 9.4 9.4 9.3    Liver Function Tests:  Recent Labs Lab 08/19/15 2035 08/21/15 0530 08/22/15 0225  AST 20 19 18   ALT 19 18 17   ALKPHOS 52 53 51  BILITOT 0.6 1.1 1.2  PROT 7.0 6.5 6.2*  ALBUMIN 3.2* 3.2* 2.9*   No results for input(s): LIPASE, AMYLASE in the last 168 hours. No results for input(s): AMMONIA in the last 168 hours.  CBC:  Recent Labs Lab 08/19/15 1418 08/19/15 2035 08/21/15 0530 08/22/15 0225  WBC 9.5 7.8 7.4 9.1  NEUTROABS  --  4.7 4.6 6.4  HGB 12.4 12.7 12.2 11.6*  HCT 38.3 38.3 36.8 34.9*  MCV 85.9 86.7 86.0 86.4  PLT 252 231 226 211    Cardiac Enzymes: No results for input(s): CKTOTAL, CKMB, CKMBINDEX, TROPONINI in the last 168 hours.  BNP: BNP (last 3 results)  Recent Labs  12/25/14 1020 08/19/15 2035  BNP 180.5* 142.7*    ProBNP (  last 3 results) No results for input(s): PROBNP in the last 8760 hours.    Other results:  Imaging: No results found.   Medications:     Scheduled Medications: . amiodarone  200 mg Oral Daily  . atorvastatin  40 mg Oral QHS  . busPIRone  30 mg Oral BID  . carvedilol  12.5 mg Oral BID WC  . digoxin  0.125 mg Oral QODAY  . DULoxetine  30 mg Oral Daily  . furosemide  20 mg Oral Daily  . heparin  5,000 Units Subcutaneous 3 times per day  . insulin aspart  0-5 Units Subcutaneous QHS  . insulin aspart  0-9 Units Subcutaneous TID WC  . ivabradine  5 mg Oral BID WC  . levothyroxine  75 mcg Oral QAC breakfast  . pantoprazole  40 mg Oral BID  . potassium chloride  40 mEq Oral Once  . rOPINIRole  1 mg Oral QHS  . sodium chloride  3 mL Intravenous Q12H  . sodium chloride  3 mL  Intravenous Q12H  . tiotropium  18 mcg Inhalation Daily    Infusions: . sodium chloride 1 mL (08/21/15 2128)    PRN Medications:     Assessment:   1. Chest pain/pressure   --cath 2009 minimal CAD 2. 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 H/o breast CA s/p mastectomy and chemo . S/P R mastectomy 2008  4. Left breast lump pending biopsy 5. HTN 6. OSA 7. DM2 8. Hypokalemia  Plan/Discussion:    Volume status stable. Start lasix 40 mg po twice daily. Cut back carvedilol  6.25 mg twice a day. Add back ramipril 5 mg at bed time.  Continue dig 0.125 mg daily. Continue ivabradine 5 mg twice a day.   Cancel CPX for now.   Per Dr Haroldine Laws- Needs to schedule breast biopsy ASAP then can get CPX later.   Follow up in HF clinic 09/04/14.   Likely D/C later today.  Length of Stay: 3   Amy Clegg NP_C 08/22/2015, 8:37 AM  Advanced Heart Failure Team Pager [phone] (M-F; 7a - 4p)  Please contact [facility] for night-coverage after hours (4p -7a ) and weekends on [email or web address]  Patient seen and examined with Darrick Grinder, NP. We discussed all aspects of the encounter. I agree with the assessment and plan as stated above.   She is stable. Results of cath reviewed. Volume status up due to volume loading pre cath. BP low. Agree with cutting back ramipril and carvedilol for now. Can go home today with close f/u. Will need breast biopsy. Plan CPX in several weeks.   Bensimhon, Daniel,MD 4:58 PM

## 2015-08-22 NOTE — Progress Notes (Signed)
Pt dressed and ready for discharge, telemetry and IV d/c per order.  Discharge instructions/education reviewed with pt and pt sister.  Pt expresses understanding of medication regimen, importance of keeping scheduled follow up appointments and education specific to specific healthcare needs.  No questions asked.  Pt discharged to home with sister.  Edison Nasuti, RN

## 2015-08-24 ENCOUNTER — Encounter: Payer: Self-pay | Admitting: *Deleted

## 2015-08-24 ENCOUNTER — Encounter (HOSPITAL_COMMUNITY): Payer: Self-pay

## 2015-08-24 NOTE — Progress Notes (Signed)
Received mammo report from [facility], reviewed by Dr. Lindi Adie, sent to scan.

## 2015-08-28 ENCOUNTER — Other Ambulatory Visit: Payer: Self-pay | Admitting: Radiology

## 2015-08-28 DIAGNOSIS — N6489 Other specified disorders of breast: Secondary | ICD-10-CM | POA: Diagnosis not present

## 2015-08-28 DIAGNOSIS — R920 Mammographic microcalcification found on diagnostic imaging of breast: Secondary | ICD-10-CM | POA: Diagnosis not present

## 2015-08-30 ENCOUNTER — Other Ambulatory Visit (HOSPITAL_COMMUNITY): Payer: Self-pay | Admitting: *Deleted

## 2015-08-30 MED ORDER — POTASSIUM CHLORIDE CRYS ER 20 MEQ PO TBCR: 40.0000 meq | EXTENDED_RELEASE_TABLET | Freq: Two times a day (BID) | ORAL | Status: DC

## 2015-08-31 DIAGNOSIS — H18413 Arcus senilis, bilateral: Secondary | ICD-10-CM | POA: Diagnosis not present

## 2015-08-31 DIAGNOSIS — H40013 Open angle with borderline findings, low risk, bilateral: Secondary | ICD-10-CM | POA: Diagnosis not present

## 2015-08-31 LAB — HM DIABETES EYE EXAM

## 2015-09-04 NOTE — Progress Notes (Signed)
Patient ID: [name], female   DOB: 10/05/1955, 60 y.o.   MRN: [id number]  Endocrinologist: Dr Loanne Drilling  Oncologist: Dr. Humphrey Rolls PCP: [name], NP ([place]) Primary HF MD: Dr Haroldine Laws  HPI Ms. Wiederkehr is a 60  year old female with history of breast cancer, obesity, HTN, DM, IBS, HLD, OSA, chemo induced cardiomyopathy s/p ICD and chronic systolic HF.    She had chemo induced cardiomyopathy.  Her EF has fallen from 45% per Cardiac MRI in July 2010 to 20-25% in 10/2012. S/P single chamber ICD (St Jude) implantation on 11/10/12 by Dr. Rayann Heman.    Admitted 08/19/15 with chest pain. Had [facility] with mild CAD and increased filling pressures. CPX placed on hold due to breast mass. She was told to have breast biopsy ASAP with her history of breast cancer x 2. Discharged on lasix 40 mg po twice daily, carvedilol 6.25 mg twice a day, ramipril 5 mg at bed time, dig 0.125 mg daily and ivabradine 5 mg twice a day. Discharge weight was 220 pounds.    She returns for post hospital follow up. Overall feeling ok. Denies SOB/PND/Orthopnea. She took metolazone on Sunday and Monday for 4 pound weight gain. Has been taking 200 mg amio twice a day instead of amio 200 mg daily and only taking potassium 20 meq twice a day. Taking all medications. Weight at home 213-219 pounds. Following low salt diet. Had breast biopsy last week with negative results.    RHC/LCH  08/20/2014   Mid Cx lesion, 30% stenosed.  3rd Mrg lesion, 30% stenosed. Findings: RA = 12 RV = 55/6/12 PA = 54/21 (34) PCW = 22 Ao 93/58 (71) LV 106/8/26 Fick cardiac output/index = 4.2/2.1 PVR = 2.8 SVR = 1115 FA sat = 93% PA sat = 54%, 53% Assessment: 1. Very mild CAD 2. Mild to moderately increased filling pressures 3. Severe LV dysfunction due to NICM EF 20%medications.   [place] on 01/30/12 showed decompensated HF with low cardiac output consistent with cardiogenic shock.  [place] 6/27  RA = 20  RV = 55/13/22  PA =56/34  (44)  PCW = 28  Fick cardiac output/index = 3.1/1.5  PVR = 5.3 [place]  FA sat = 91%  PA sat = 42%, 48%  SVC sat = 46%  Echo 08/12/11: Left ventricle: mildly dilated, EF 20% to 25%. Diffuse hypokinesis. Doppler parameters are consistent with restrictive physiology, indicative of decreased left ventricular diastolic compliance and/or increased left atrial pressure. Left atrium was moderately dilated. Right atrium mildly dilated. 04/09/12 ECHO EF 30-35% LV mildly dilated  Hyopkinesis 10/07/12 ECHO EF 20-25% RV ok. 03/31/14: ECHO EF 30-35%, severe HK inferior/inferoseptal walls, mild MR, LA mod dilated, RA mildly dilated 06/28/2015: ECHO EF 25% RV mildly dilated. Severe HK inferior wall.   Myoview in 05/2010 EF 59%. No ischemia.    ROS: All systems negative except as listed in HPI, PMH and Problem List.  Social History   Social History  . Marital Status: Divorced    Spouse Name: N/A  . Number of Children: N/A  . Years of Education: N/A   Occupational History  . Disabled    Social History Main Topics  . Smoking status: Former Smoker    Quit date: 04/12/2001  . Smokeless tobacco: Never Used  . Alcohol Use: No  . Drug Use: No  . Sexual Activity: Not Currently   Other Topics Concern  . Not on file   Social History Narrative   Lives with  mother and brother Philis Nettle         Family History  Problem Relation Age of Onset  . Diabetes Mother     lung cancer, CHF, DM2  . Coronary artery disease Father 20    HTN, CABG x 3, DM2, deceased  . Cancer Cousin     Breast cancer   . Cancer Cousin     Breast cancer  . Cancer Cousin     Uterine cancer    Past Medical History  Diagnosis Date  . 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 DYSLIPIDEMIA 05/01/2009  . OBESITY 07/24/2007  . OBSTRUCTIVE SLEEP APNEA 07/24/2007    with CPAP compliance  . RESTLESS LEG SYNDROME 07/24/2007  . SYSTOLIC HEART FAILURE, CHRONIC 01/05/2009    a. chemo induced cardiomyopathy b. cMRI (02/2009) EF 45% c. Myoview 05/2010: EF  59%, no ischemia d. RHC (01/2012) RA 20, RV 55/13/22, PA 56/34 (44), PCWP 28, Fick CO/CI: 3.1 / 1.5, PVR 5.3 WU, PA sat 42% & 48% e. ECHO (10/2012) EF 20-25%, diff HK, MV calcified annulus f. ECHO (03/2014) EF 30-35%, sev HK, inferior/inferoseptal walls, mild MR  . Palpitations 09/20/2010  . 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 Hypertension   . History of breast cancer 2006    with return in 2008, s/p mastectomy, XRT and chemotherapy  . Osteoarthritis   . Anxiety   . Depression     followed by a psychiatrist  . Asthma   . GERD (gastroesophageal reflux disease)   . Gastroparesis   . IBS (irritable bowel syndrome)   . Myocardial infarction ([place])   . ICD (implantable cardiac defibrillator) in place 11/10/2012    ICD (11/2012)  . COPD (chronic obstructive pulmonary disease) ([place])     Current Outpatient Prescriptions  Medication Sig Dispense Refill  . acetaminophen (TYLENOL ARTHRITIS PAIN) 650 MG CR tablet Take 1,300 mg by mouth every 8 (eight) hours as needed. pain    . albuterol (PROVENTIL HFA) 108 (90 BASE) MCG/ACT inhaler Inhale 2 puffs into the lungs every 6 (six) hours as needed. For shortness of breath    . amiodarone (PACERONE) 200 MG tablet Take 200 mg by mouth 2 (two) times daily.    Marland Kitchen atorvastatin (LIPITOR) 40 MG tablet Take 40 mg by mouth daily.      . busPIRone (BUSPAR) 30 MG tablet Take 30 mg by mouth 2 (two) times daily.     . carvedilol (COREG) 12.5 MG tablet Take 0.5 tablets (6.25 mg total) by mouth 2 (two) times daily with a meal. 30 tablet 6  . digoxin (LANOXIN) 0.125 MG tablet Take 1 tablet (0.125 mg total) by mouth every other day. 90 tablet 3  . DULoxetine (CYMBALTA) 30 MG capsule Take 30 mg by mouth daily.    . furosemide (LASIX) 20 MG tablet 40 mg twice a day 60 tablet 3  . Garlic 123XX123 MG CAPS Take 1 capsule by mouth daily. Reported on 08/19/2015    . Glucosamine 500 MG CAPS Take 1 capsule by mouth daily.      . insulin lispro (HUMALOG) 100 UNIT/ML injection Inject 40  Units into the skin 2 (two) times daily with a meal.     . Insulin Pen Needle (B-D ULTRAFINE III SHORT PEN) 31G X 8 MM MISC 18/day as directed dx 250.00 500 each 11  . ivabradine (CORLANOR) 5 MG TABS tablet Take 1 tablet (5 mg total) by mouth 2 (two) times daily with a meal. 60 tablet 3  . levothyroxine (SYNTHROID, LEVOTHROID) 75 MCG tablet  Take 75 mcg by mouth daily before breakfast.     . metolazone (ZAROXOLYN) 2.5 MG tablet Take 2.5 mg by mouth as needed (for weight gain >3 lbs).    . mometasone (NASONEX) 50 MCG/ACT nasal spray Place 1 spray into both nostrils daily. allergies    . omega-3 acid ethyl esters (LOVAZA) 1 G capsule Take 2 g by mouth 2 (two) times daily.     . pantoprazole (PROTONIX) 40 MG tablet Take 40 mg by mouth 2 (two) times daily.      . potassium chloride SA (K-DUR,KLOR-CON) 20 MEQ tablet Take 20 mEq by mouth 2 (two) times daily.    . ramipril (ALTACE) 5 MG capsule Take 1 capsule (5 mg total) by mouth at bedtime. 30 capsule 6  . rOPINIRole (REQUIP) 1 MG tablet Take 1 mg by mouth at bedtime.      Marland Kitchen spironolactone (ALDACTONE) 25 MG tablet Take 25 mg by mouth daily.    . SUMAtriptan (IMITREX) 25 MG tablet Take 25 mg by mouth every 2 (two) hours as needed. For migraine    . tiotropium (SPIRIVA) 18 MCG inhalation capsule Place 18 mcg into inhaler and inhale daily.      . TRESIBA FLEXTOUCH 200 UNIT/ML SOPN Inject 160 Units into the skin every morning.     . vitamin B-12 (CYANOCOBALAMIN) 1000 MCG tablet Take 1,000 mcg by mouth daily.    . vitamin C (ASCORBIC ACID) 500 MG tablet Take 500 mg by mouth daily.    . vitamin E 400 UNIT capsule Take 400 Units by mouth daily.    . nitroGLYCERIN (NITROSTAT) 0.4 MG SL tablet Place 1 tablet (0.4 mg total) under the tongue every 5 (five) minutes as needed. For chest pain (Patient not taking: Reported on 09/05/2015) 25 tablet 3   No current facility-administered medications for this encounter.   Filed Vitals:   09/05/15 1033  BP: 118/70    Pulse: 82  Weight: 219 lb (99.338 kg)  SpO2: 94%     PHYSICAL EXAM: General:  Obese, Well appearing. Ambulated in the clinic without difficulty   HEENT: normal  Neck: supple. JVP hard to see but dose not appear elevated, carotids 2+ bilaterally; no bruits. No lymphadenopathy or thryomegaly appreciated. Cor: PMI normal. Regular rate & rhythm. no S3. Lungs: clear Abdomen: soft, obese, nontender, no distention. No hepatosplenomegaly. No bruits or masses. Good bowel sounds. Extremities: no cyanosis, clubbing, rash, no edema Neuro: alert & orientedx3, cranial nerves grossly intact. Moves all 4 extremities w/o difficulty. Affect pleasant.   ASSESSMENT & PLAN:  1) Chronic systolic HF, NICM thought to be chemo induced, s/p ST Jude  ICD. Most Recent ECHO 06/28/2015  EF ~20%. RV mildly dilated.  - Has NYHA II-III symptoms.  Will continue lasix 40 mg  BID. Continue spiro 25 mg daily.  - Continue coreg 6.25 mg twice a day.   - Continue ramipril 5 mg qhs.   -Continue digoxin 0.125 mg daily . Dig level 0.7 on 08/2015  -Continue corlanor to 5 mg twice a day.   - Check BMET today.  -Check CPX to determine if limitations are related to obesity versus HF versus pulmonary.   - Reinforced the need and importance of daily weights, a low sodium diet, and fluid restriction (less than 2 L a day). Instructed to call the HF clinic if weight increases more than 3 lbs overnight or 5 lbs in a week.  2) HTN - Stable.  Continue current medications.Marland Kitchen  3) OSA -  Compliant with CPAP nightly. 4) HLD - Followed by PCP.  5) Obesity - Limiting portions. Needs to lose weight.   6) CAD - mild nonobstructive CAD on 2017 cath. Will continue statin, BB and ACE-I. No s/s of ischemia. 7) NSVT- Cut back amio to 200 mg daily. Needs yearly eye exam.   8) Breast Mass- Had biopsy. Negative results. .      Follow up in 4 weeks to discuss CPX results.    Cleotilde Spadaccini NP-C 10:47 AM

## 2015-09-05 ENCOUNTER — Ambulatory Visit (HOSPITAL_COMMUNITY)
Admit: 2015-09-05 | Discharge: 2015-09-05 | Disposition: A | Discharge status: Home / Self Care | Payer: Medicare Other | Source: Ambulatory Visit | Attending: Internal Medicine | Admitting: Internal Medicine

## 2015-09-05 VITALS — BP 118/70 | HR 82 | Wt 219.0 lb

## 2015-09-05 DIAGNOSIS — Z794 Long term (current) use of insulin: Secondary | ICD-10-CM | POA: Diagnosis not present

## 2015-09-05 DIAGNOSIS — Z87891 Personal history of nicotine dependence: Secondary | ICD-10-CM | POA: Insufficient documentation

## 2015-09-05 DIAGNOSIS — F329 Major depressive disorder, single episode, unspecified: Secondary | ICD-10-CM | POA: Insufficient documentation

## 2015-09-05 DIAGNOSIS — Z79899 Other long term (current) drug therapy: Secondary | ICD-10-CM | POA: Diagnosis not present

## 2015-09-05 DIAGNOSIS — I5022 Chronic systolic (congestive) heart failure: Secondary | ICD-10-CM | POA: Diagnosis not present

## 2015-09-05 DIAGNOSIS — K3184 Gastroparesis: Secondary | ICD-10-CM | POA: Insufficient documentation

## 2015-09-05 DIAGNOSIS — E669 Obesity, unspecified: Secondary | ICD-10-CM

## 2015-09-05 DIAGNOSIS — Z853 Personal history of malignant neoplasm of breast: Secondary | ICD-10-CM | POA: Insufficient documentation

## 2015-09-05 DIAGNOSIS — G2581 Restless legs syndrome: Secondary | ICD-10-CM | POA: Diagnosis not present

## 2015-09-05 DIAGNOSIS — E785 Hyperlipidemia, unspecified: Secondary | ICD-10-CM | POA: Diagnosis not present

## 2015-09-05 DIAGNOSIS — E1143 Type 2 diabetes mellitus with diabetic autonomic (poly)neuropathy: Secondary | ICD-10-CM | POA: Insufficient documentation

## 2015-09-05 DIAGNOSIS — I4729 Other ventricular tachycardia: Secondary | ICD-10-CM

## 2015-09-05 DIAGNOSIS — J45909 Unspecified asthma, uncomplicated: Secondary | ICD-10-CM | POA: Insufficient documentation

## 2015-09-05 DIAGNOSIS — I2510 Atherosclerotic heart disease of native coronary artery without angina pectoris: Secondary | ICD-10-CM | POA: Diagnosis not present

## 2015-09-05 DIAGNOSIS — I110 Hypertensive heart disease with heart failure: Secondary | ICD-10-CM | POA: Diagnosis not present

## 2015-09-05 DIAGNOSIS — N63 Unspecified lump in breast: Secondary | ICD-10-CM | POA: Insufficient documentation

## 2015-09-05 DIAGNOSIS — Z8249 Family history of ischemic heart disease and other diseases of the circulatory system: Secondary | ICD-10-CM | POA: Insufficient documentation

## 2015-09-05 DIAGNOSIS — J449 Chronic obstructive pulmonary disease, unspecified: Secondary | ICD-10-CM | POA: Diagnosis not present

## 2015-09-05 DIAGNOSIS — I472 Ventricular tachycardia: Secondary | ICD-10-CM | POA: Insufficient documentation

## 2015-09-05 DIAGNOSIS — I428 Other cardiomyopathies: Secondary | ICD-10-CM | POA: Diagnosis not present

## 2015-09-05 DIAGNOSIS — I252 Old myocardial infarction: Secondary | ICD-10-CM | POA: Insufficient documentation

## 2015-09-05 DIAGNOSIS — K219 Gastro-esophageal reflux disease without esophagitis: Secondary | ICD-10-CM | POA: Diagnosis not present

## 2015-09-05 DIAGNOSIS — I5023 Acute on chronic systolic (congestive) heart failure: Secondary | ICD-10-CM | POA: Diagnosis not present

## 2015-09-05 DIAGNOSIS — G4733 Obstructive sleep apnea (adult) (pediatric): Secondary | ICD-10-CM | POA: Diagnosis not present

## 2015-09-05 DIAGNOSIS — Z833 Family history of diabetes mellitus: Secondary | ICD-10-CM | POA: Insufficient documentation

## 2015-09-05 DIAGNOSIS — Z95810 Presence of automatic (implantable) cardiac defibrillator: Secondary | ICD-10-CM | POA: Insufficient documentation

## 2015-09-05 LAB — BASIC METABOLIC PANEL
ANION GAP: 11 (ref 5–15)
BUN: 23 mg/dL — ABNORMAL HIGH (ref 6–20)
CO2: 31 mmol/L (ref 22–32)
Calcium: 10.1 mg/dL (ref 8.9–10.3)
Chloride: 92 mmol/L — ABNORMAL LOW (ref 101–111)
Creatinine, Ser: 1.26 mg/dL — ABNORMAL HIGH (ref 0.44–1.00)
GFR, EST AFRICAN AMERICAN: 53 mL/min — AB (ref >60)
GFR, EST NON AFRICAN AMERICAN: 45 mL/min — AB (ref >60)
GLUCOSE: 318 mg/dL — AB (ref 65–99)
POTASSIUM: 3.3 mmol/L — AB (ref 3.5–5.1)
SODIUM: 134 mmol/L — AB (ref 135–145)

## 2015-09-05 MED ORDER — AMIODARONE HCL 200 MG PO TABS: 200.0000 mg | ORAL_TABLET | Freq: Every day | ORAL | Status: DC

## 2015-09-05 MED ORDER — POTASSIUM CHLORIDE CRYS ER 20 MEQ PO TBCR: 40.0000 meq | EXTENDED_RELEASE_TABLET | Freq: Two times a day (BID) | ORAL | Status: DC

## 2015-09-05 NOTE — Patient Instructions (Signed)
DECREASE Amiodarone to 200 mg , one tab daily INCREASE Potassium 40 meQ, twice a day   Your physician has recommended that you have a cardiopulmonary stress test (CPX). CPX testing is a non-invasive measurement of heart and lung function. It replaces a traditional treadmill stress test. This type of test provides a tremendous amount of information that relates not only to your present condition but also for future outcomes. This test combines measurements of you ventilation, respiratory gas exchange in the lungs, electrocardiogram (EKG), blood pressure and physical response before, during, and following an exercise protocol.  Your physician recommends that you schedule a follow-up appointment in: 4 weeks

## 2015-09-05 NOTE — Progress Notes (Signed)
Advanced Heart Failure Medication Review by a Pharmacist  Does the patient  feel that his/her medications are working for him/her?  yes  Has the patient been experiencing any side effects to the medications prescribed?  yes  Does the patient measure his/her own blood pressure or blood glucose at home?  yes   Does the patient have any problems obtaining medications due to transportation or finances?   no  Understanding of regimen: good Understanding of indications: good Potential of compliance: good Patient understands to avoid NSAIDs. Patient understands to avoid decongestants.  Issues to address at subsequent visits: None   Pharmacist comments:  Ms. Pedone is a pleasant 60 yo F presenting without a medication list but with good recall of her regimen including dosages. She reports good compliance with her regimen and states that she needed to take metolazone on Sunday and Monday for a 4 lb weight gain which is now back down. She also stated that she has been constipated since starting amiodarone. I recommended using docusate for a few days to see if this helps.   Ruta Hinds. Velva Harman, PharmD, BCPS, CPP Clinical Pharmacist Pager: [phone] Phone: [phone] 09/05/2015 11:03 AM      Time with patient: 10 minutes Preparation and documentation time: 2 minutes Total time: 12 minutes

## 2015-09-08 DIAGNOSIS — I10 Essential (primary) hypertension: Secondary | ICD-10-CM | POA: Diagnosis not present

## 2015-09-11 ENCOUNTER — Ambulatory Visit (HOSPITAL_COMMUNITY): Payer: Medicare Other | Attending: Internal Medicine

## 2015-09-11 ENCOUNTER — Other Ambulatory Visit (HOSPITAL_COMMUNITY): Payer: Self-pay | Admitting: *Deleted

## 2015-09-11 DIAGNOSIS — I5023 Acute on chronic systolic (congestive) heart failure: Secondary | ICD-10-CM | POA: Insufficient documentation

## 2015-09-11 DIAGNOSIS — R0600 Dyspnea, unspecified: Secondary | ICD-10-CM | POA: Diagnosis not present

## 2015-09-13 ENCOUNTER — Encounter: Payer: Self-pay | Admitting: Hematology and Oncology

## 2015-09-13 ENCOUNTER — Encounter: Payer: Self-pay | Admitting: *Deleted

## 2015-09-22 ENCOUNTER — Ambulatory Visit (INDEPENDENT_AMBULATORY_CARE_PROVIDER_SITE_OTHER): Payer: Medicare Other | Admitting: Internal Medicine

## 2015-09-22 ENCOUNTER — Encounter: Payer: Self-pay | Admitting: Internal Medicine

## 2015-09-22 VITALS — BP 130/62 | HR 99 | Ht 65.0 in | Wt 221.8 lb

## 2015-09-22 DIAGNOSIS — I480 Paroxysmal atrial fibrillation: Secondary | ICD-10-CM | POA: Insufficient documentation

## 2015-09-22 DIAGNOSIS — I5022 Chronic systolic (congestive) heart failure: Secondary | ICD-10-CM

## 2015-09-22 DIAGNOSIS — G4733 Obstructive sleep apnea (adult) (pediatric): Secondary | ICD-10-CM

## 2015-09-22 DIAGNOSIS — E669 Obesity, unspecified: Secondary | ICD-10-CM

## 2015-09-22 DIAGNOSIS — I4891 Unspecified atrial fibrillation: Secondary | ICD-10-CM

## 2015-09-22 LAB — CUP PACEART INCLINIC DEVICE CHECK
Battery Remaining Longevity: 76 mo
Brady Statistic RV Percent Paced: 1 % — CL
HIGH POWER IMPEDANCE MEASURED VALUE: 97 Ohm
Lead Channel Pacing Threshold Amplitude: 0.75 V
Lead Channel Sensing Intrinsic Amplitude: 12 mV
Lead Channel Setting Pacing Amplitude: 2.5 V
Lead Channel Setting Pacing Pulse Width: 0.5 ms
MDC IDC LEAD IMPLANT DT: 20140408
MDC IDC LEAD LOCATION: 753860
MDC IDC MSMT BATTERY REMAINING PERCENTAGE: 72 %
MDC IDC MSMT LEADCHNL RV IMPEDANCE VALUE: 580 Ohm
MDC IDC MSMT LEADCHNL RV PACING THRESHOLD PULSEWIDTH: 0.5 ms
MDC IDC PG SERIAL: 7066421
MDC IDC SESS DTM: 20170217173153
MDC IDC SET LEADCHNL RV SENSING SENSITIVITY: 0.5 mV

## 2015-09-22 MED ORDER — CARVEDILOL 12.5 MG PO TABS: 12.5000 mg | ORAL_TABLET | Freq: Two times a day (BID) | ORAL | Status: DC

## 2015-09-22 MED ORDER — RIVAROXABAN 20 MG PO TABS: 20.0000 mg | ORAL_TABLET | Freq: Every day | ORAL | Status: DC

## 2015-09-22 NOTE — Patient Instructions (Signed)
Medication Instructions:  Your physician has recommended you make the following change in your medication:  1) Increase Carvedilol to 12.5mg  twice daily 2) Start Xarelto 20 mg daily   Labwork: None ordered   Testing/Procedures: None ordered   Follow-Up: Your physician recommends that you schedule a follow-up appointment early next week for afib clinic on Tues at 3:00pm  Your physician wants you to follow-up in: 12 months with Dr Vallery Ridge will receive a reminder letter in the mail two months in advance. If you don't receive a letter, please call our office to schedule the follow-up appointment.  Remote monitoring is used to monitor your ICD from home. This monitoring reduces the number of office visits required to check your device to one time per year. It allows [place] to keep an eye on the functioning of your device to ensure it is working properly. You are scheduled for a device check from home on 12/25/15. You may send your transmission at any time that day. If you have a wireless device, the transmission will be sent automatically. After your physician reviews your transmission, you will receive a postcard with your next transmission date.  Sharman Cheek, RN will call you in regards to monthly ICM monitoring  You will receive a call from Jake Bathe, RN in regards to genetic afib screening       Any Other Special Instructions Will Be Listed Below (If Applicable).     If you need a refill on your cardiac medications before your next appointment, please call your pharmacy.

## 2015-09-22 NOTE — Progress Notes (Signed)
Electrophysiology Office Note   Date:  09/22/2015   ID:  [name], DOB Feb 10, 1956, MRN [id number]  PCP:  Nicoletta Dress, MD  Cardiologist:  Dr Haroldine Laws Primary Electrophysiologist: Thompson Grayer, MD    Chief Complaint  Patient presents with  . Chronic systolic heart failure     History of Present Illness: [name] is a 60 y.o. female who presents today for electrophysiology evaluation.   The patient reports recent worsening of tachypalpitations with a sense of "anxiety".  She has occasional associated dizziness.  She has been keeping track of her BP/HRs at home and notes that her heart rate increased yesterday.  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  I suspect that a lot of her symptoms were due to afib.  She did wear an event monitor recently which did not reveal afib.  She has been recently hospitalized for CHF but feels that she is currently "much better".  Her SOB is at baseline currently.   Today, she denies symptoms of chest pain, orthopnea, PND, lower extremity edema, claudication,   presyncope, syncope, bleeding, or neurologic sequela. The patient is tolerating medications without difficulties and is otherwise without complaint today.    Past Medical History  Diagnosis Date  . 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 DYSLIPIDEMIA 05/01/2009  . OBESITY 07/24/2007  . OBSTRUCTIVE SLEEP APNEA 07/24/2007    with CPAP compliance  . RESTLESS LEG SYNDROME 07/24/2007  . SYSTOLIC HEART FAILURE, CHRONIC 01/05/2009    a. chemo induced cardiomyopathy b. cMRI (02/2009) EF 45% c. Myoview 05/2010: EF 59%, no ischemia d. RHC (01/2012) RA 20, RV 55/13/22, PA 56/34 (44), PCWP 28, Fick CO/CI: 3.1 / 1.5, PVR 5.3 WU, PA sat 42% & 48% e. ECHO (10/2012) EF 20-25%, diff HK, MV calcified annulus f. ECHO (03/2014) EF 30-35%, sev HK, inferior/inferoseptal walls, mild MR  . 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 Hypertension   . History of breast cancer 2006    with return  in 2008, s/p mastectomy, XRT and chemotherapy  . Osteoarthritis   . Anxiety   . Depression     followed by a psychiatrist  . Asthma   . GERD (gastroesophageal reflux disease)   . Gastroparesis   . IBS (irritable bowel syndrome)   . Myocardial infarction ([place])   . ICD (implantable cardiac defibrillator) in place 11/10/2012    ICD (11/2012)  . COPD (chronic obstructive pulmonary disease) ([place])   . Persistent atrial fibrillation ([place]) 09/2014   Past Surgical History  Procedure Laterality Date  . Mastectomy      right  . Abdominal hysterectomy  1991  . Cardiac defibrillator placement  11/10/2012    SJM Fortify Assura VR implanted by Dr Rayann Heman for primary prevention  . Cholecystectomy    . Wrist surgery Bilateral   . Knee cartilage surgery    . Bi-ventricular implantable cardioverter defibrillator N/A 11/10/2012    SJM Forify Assura single chamber ICD  . Cardiac catheterization N/A 08/21/2015    Procedure: Right/Left Heart Cath and Coronary Angiography;  Surgeon: Jolaine Artist, MD;  Location: [facility];  Service: Cardiovascular;  Laterality: N/A;     Current Outpatient Prescriptions  Medication Sig Dispense Refill  . acetaminophen (TYLENOL ARTHRITIS PAIN) 650 MG CR tablet Take 1,300 mg by mouth every 8 (eight) hours as needed. pain    . albuterol (PROVENTIL HFA) 108 (90 BASE) MCG/ACT inhaler Inhale 2 puffs into the lungs every 6 (six) hours as  needed. For shortness of breath    . amiodarone (PACERONE) 200 MG tablet Take 1 tablet (200 mg total) by mouth daily. 30 tablet 6  . atorvastatin (LIPITOR) 40 MG tablet Take 40 mg by mouth daily.      . busPIRone (BUSPAR) 30 MG tablet Take 30 mg by mouth 2 (two) times daily.     . carvedilol (COREG) 12.5 MG tablet Take 1 tablet (12.5 mg total) by mouth 2 (two) times daily with a meal. 180 tablet 3  . digoxin (LANOXIN) 0.125 MG tablet Take 1 tablet (0.125 mg total) by mouth every other day. 90 tablet 3  . DULoxetine (CYMBALTA) 30 MG  capsule Take 30 mg by mouth daily.    . furosemide (LASIX) 20 MG tablet 40 mg twice a day 60 tablet 3  . Garlic 123XX123 MG CAPS Take 1 capsule by mouth daily. Reported on 08/19/2015    . Glucosamine 500 MG CAPS Take 1 capsule by mouth daily.      . Insulin Glargine (TOUJEO SOLOSTAR [place]) Inject 80 Units into the skin daily.    . insulin lispro (HUMALOG) 100 UNIT/ML injection Inject 40 Units into the skin 2 (two) times daily with a meal.     . Insulin Pen Needle (B-D ULTRAFINE III SHORT PEN) 31G X 8 MM MISC 18/day as directed dx 250.00 500 each 11  . ivabradine (CORLANOR) 5 MG TABS tablet Take 1 tablet (5 mg total) by mouth 2 (two) times daily with a meal. 60 tablet 3  . levothyroxine (SYNTHROID, LEVOTHROID) 75 MCG tablet Take 75 mcg by mouth daily before breakfast.     . metolazone (ZAROXOLYN) 2.5 MG tablet Take 2.5 mg by mouth as needed (for weight gain >3 lbs).    . mometasone (NASONEX) 50 MCG/ACT nasal spray Place 1 spray into both nostrils daily. allergies    . nitroGLYCERIN (NITROSTAT) 0.4 MG SL tablet Place 1 tablet (0.4 mg total) under the tongue every 5 (five) minutes as needed. For chest pain 25 tablet 3  . omega-3 acid ethyl esters (LOVAZA) 1 G capsule Take 2 g by mouth 2 (two) times daily.     . pantoprazole (PROTONIX) 40 MG tablet Take 40 mg by mouth 2 (two) times daily.      . potassium chloride SA (K-DUR,KLOR-CON) 20 MEQ tablet Take 2 tablets (40 mEq total) by mouth 2 (two) times daily. 120 tablet 6  . ramipril (ALTACE) 5 MG capsule Take 1 capsule (5 mg total) by mouth at bedtime. 30 capsule 6  . rOPINIRole (REQUIP) 1 MG tablet Take 1 mg by mouth at bedtime.      Marland Kitchen spironolactone (ALDACTONE) 25 MG tablet Take 25 mg by mouth daily.    . SUMAtriptan (IMITREX) 25 MG tablet Take 25 mg by mouth every 2 (two) hours as needed. For migraine    . tiotropium (SPIRIVA) 18 MCG inhalation capsule Place 18 mcg into inhaler and inhale daily.      . vitamin B-12 (CYANOCOBALAMIN) 1000 MCG tablet Take  1,000 mcg by mouth daily.    . vitamin C (ASCORBIC ACID) 500 MG tablet Take 500 mg by mouth daily.    . vitamin E 400 UNIT capsule Take 400 Units by mouth daily.    . rivaroxaban (XARELTO) 20 MG TABS tablet Take 1 tablet (20 mg total) by mouth daily with supper. 30 tablet 11   No current facility-administered medications for this visit.    Allergies:   Bee venom; Other; Aspirin; Ciprofloxacin;  Codeine; Naproxen; Sulfonamide derivatives; Nsaids; and Tape   Social History:  The patient  reports that she quit smoking about 14 years ago. She has never used smokeless tobacco. She reports that she does not drink alcohol or use illicit drugs.   Family History:  The patient's  family history includes Breast cancer in her cousin and cousin; Congestive Heart Failure in her mother; Coronary artery disease in her father; Diabetes Mellitus II in her father and mother; Hypertension in her father; Lung cancer in her mother; Uterine cancer in her cousin.    ROS:  Please see the history of present illness.   All other systems are reviewed and negative.    PHYSICAL EXAM: VS:  BP 130/62 mmHg  Pulse 99  Ht 5\' 5"  (1.651 m)  Wt 221 lb 12.8 oz (100.608 kg)  BMI 36.91 kg/m2 , BMI Body mass index is 36.91 kg/(m^2). GEN: overweight, appears older that stated age, in no acute distress HEENT: normal Neck: no JVD, carotid bruits, or masses Cardiac: tachycardic irregular rhythm; no murmurs, rubs, or gallops,+1 edema  Respiratory:  clear to auscultation bilaterally, normal work of breathing GI: soft, nontender, nondistended, + BS MS: diffuse muscle atrophy Skin: warm and dry  Neuro:  Strength and sensation are intact Psych: euthymic mood, full affect Skin- ICD pocket is well healed  EKG:  EKG is ordered today. The ekg ordered today shows afib with V rate 113 bpm, RBBB (QRS 134 msec), Qtc 510 msec   Recent Labs: 08/19/2015: B Natriuretic Peptide 142.7*; TSH 3.130 08/22/2015: ALT 17; Hemoglobin 11.6*;  Platelets 211 09/05/2015: BUN 23*; Creatinine, Ser 1.26*; Potassium 3.3*; Sodium 134*    Lipid Panel     Component Value Date/Time   CHOL 147 08/19/2015 2035   TRIG 297* 08/19/2015 2035   HDL 37* 08/19/2015 2035   CHOLHDL 4.0 08/19/2015 2035   VLDL 59* 08/19/2015 2035   LDLCALC 51 08/19/2015 2035     Wt Readings from Last 3 Encounters:  09/22/15 221 lb 12.8 oz (100.608 kg)  09/05/15 219 lb (99.338 kg)  08/22/15 220 lb 9.6 oz (100.064 kg)      Other studies Reviewed: Additional studies/ records that were reviewed today include: CHF clinic notes   ASSESSMENT AND PLAN:  1.  Now onset afib Pt with new onset symptomatic afib V rates elevated Will increase coreg Continue amiodarone This patients CHA2DS2-VASc Score and unadjusted Ischemic Stroke Rate (% per year) is equal to 3.2 % stroke rate/year from a score of 3 Today, I discussed Coumadin as well as novel anticoagulants including Pradaxa, Xarelto, Savaysa, and Eliquis today as indicated for risk reduction in stroke and systemic emboli with nonvalvular atrial fibrillation.  Risks, benefits, and alternatives to each of these drugs were discussed at length today.  She would prefer xarelto.  I will therefore start on xarelto 20mg  daily today. Refer for GENETIC AF study. Follow-up closely in the AF clinic in conjuction with the CHF clinic  2. Chronic systolic dysfunction euvolemic today coreview is stable Normal ICD function See Pace Art report No changes today Will enroll in ICM device clinic  I have discussed SJM Fortify Assura advisary with the patient today. She understands that recommendation from SJM is to not replace the device at this time. The patient is not device dependant.  The patient has not had appropriate device therapy in the past or implanted for secondary prevention.  Vibratory alert demonstrated today.  She is actively remotely monitored and understands the importance of compliance  today. Per St Jude  recommendation, we will follow closely with remote monitoring and not consider device replacement at this time.  3. OSA Compliance with CPAP advised  4. Obesity Weight loss advised  Follow closely with AF and CHF clinic I will follow remotely with Merlin  The patient has multiple chronic comorbidities and new onset AF.  She is at risk for decompensation/ hospitalization.  A high level of decision making was required for the visit today.    Labs/ tests ordered today include:  Orders Placed This Encounter  Procedures  . EKG 12-Lead     Signed, Thompson Grayer, MD  09/22/2015 4:48 PM     [address] [place] [id number] [phone] (office) [phone] (fax)

## 2015-09-25 ENCOUNTER — Telehealth: Payer: Self-pay

## 2015-09-25 NOTE — Telephone Encounter (Signed)
Prior auth for Xarelto 20mg sent to [facility]. 

## 2015-09-25 NOTE — Telephone Encounter (Signed)
Xarelto approved through 08/04/2016. [id number].

## 2015-09-26 ENCOUNTER — Encounter (HOSPITAL_COMMUNITY): Payer: Self-pay | Admitting: Cardiology

## 2015-09-26 ENCOUNTER — Ambulatory Visit (HOSPITAL_COMMUNITY)
Admission: RE | Admit: 2015-09-26 | Discharge: 2015-09-26 | Disposition: A | Discharge status: Home / Self Care | Payer: Medicare Other | Source: Ambulatory Visit | Attending: Nurse Practitioner | Admitting: Nurse Practitioner

## 2015-09-26 VITALS — BP 118/60 | HR 91 | Ht 65.0 in | Wt 220.4 lb

## 2015-09-26 DIAGNOSIS — I110 Hypertensive heart disease with heart failure: Secondary | ICD-10-CM | POA: Diagnosis not present

## 2015-09-26 DIAGNOSIS — Z882 Allergy status to sulfonamides status: Secondary | ICD-10-CM | POA: Diagnosis not present

## 2015-09-26 DIAGNOSIS — E785 Hyperlipidemia, unspecified: Secondary | ICD-10-CM | POA: Insufficient documentation

## 2015-09-26 DIAGNOSIS — F329 Major depressive disorder, single episode, unspecified: Secondary | ICD-10-CM | POA: Insufficient documentation

## 2015-09-26 DIAGNOSIS — F419 Anxiety disorder, unspecified: Secondary | ICD-10-CM | POA: Insufficient documentation

## 2015-09-26 DIAGNOSIS — G2581 Restless legs syndrome: Secondary | ICD-10-CM | POA: Insufficient documentation

## 2015-09-26 DIAGNOSIS — Z95810 Presence of automatic (implantable) cardiac defibrillator: Secondary | ICD-10-CM | POA: Diagnosis not present

## 2015-09-26 DIAGNOSIS — G4733 Obstructive sleep apnea (adult) (pediatric): Secondary | ICD-10-CM | POA: Insufficient documentation

## 2015-09-26 DIAGNOSIS — Z8249 Family history of ischemic heart disease and other diseases of the circulatory system: Secondary | ICD-10-CM | POA: Diagnosis not present

## 2015-09-26 DIAGNOSIS — Z87891 Personal history of nicotine dependence: Secondary | ICD-10-CM | POA: Insufficient documentation

## 2015-09-26 DIAGNOSIS — J449 Chronic obstructive pulmonary disease, unspecified: Secondary | ICD-10-CM | POA: Diagnosis not present

## 2015-09-26 DIAGNOSIS — Z794 Long term (current) use of insulin: Secondary | ICD-10-CM | POA: Diagnosis not present

## 2015-09-26 DIAGNOSIS — Z79899 Other long term (current) drug therapy: Secondary | ICD-10-CM | POA: Insufficient documentation

## 2015-09-26 DIAGNOSIS — K219 Gastro-esophageal reflux disease without esophagitis: Secondary | ICD-10-CM | POA: Diagnosis not present

## 2015-09-26 DIAGNOSIS — K3184 Gastroparesis: Secondary | ICD-10-CM | POA: Insufficient documentation

## 2015-09-26 DIAGNOSIS — Z888 Allergy status to other drugs, medicaments and biological substances status: Secondary | ICD-10-CM | POA: Diagnosis not present

## 2015-09-26 DIAGNOSIS — Z6836 Body mass index (BMI) 36.0-36.9, adult: Secondary | ICD-10-CM | POA: Diagnosis not present

## 2015-09-26 DIAGNOSIS — Z886 Allergy status to analgesic agent status: Secondary | ICD-10-CM | POA: Diagnosis not present

## 2015-09-26 DIAGNOSIS — E669 Obesity, unspecified: Secondary | ICD-10-CM | POA: Insufficient documentation

## 2015-09-26 DIAGNOSIS — E1143 Type 2 diabetes mellitus with diabetic autonomic (poly)neuropathy: Secondary | ICD-10-CM | POA: Diagnosis not present

## 2015-09-26 DIAGNOSIS — E119 Type 2 diabetes mellitus without complications: Secondary | ICD-10-CM | POA: Insufficient documentation

## 2015-09-26 DIAGNOSIS — I5022 Chronic systolic (congestive) heart failure: Secondary | ICD-10-CM | POA: Insufficient documentation

## 2015-09-26 DIAGNOSIS — Z853 Personal history of malignant neoplasm of breast: Secondary | ICD-10-CM | POA: Diagnosis not present

## 2015-09-26 DIAGNOSIS — Z7902 Long term (current) use of antithrombotics/antiplatelets: Secondary | ICD-10-CM | POA: Diagnosis not present

## 2015-09-26 DIAGNOSIS — Z833 Family history of diabetes mellitus: Secondary | ICD-10-CM | POA: Insufficient documentation

## 2015-09-26 DIAGNOSIS — I4891 Unspecified atrial fibrillation: Secondary | ICD-10-CM

## 2015-09-26 DIAGNOSIS — J45909 Unspecified asthma, uncomplicated: Secondary | ICD-10-CM | POA: Diagnosis not present

## 2015-09-26 DIAGNOSIS — E032 Hypothyroidism due to medicaments and other exogenous substances: Secondary | ICD-10-CM | POA: Insufficient documentation

## 2015-09-26 DIAGNOSIS — Z885 Allergy status to narcotic agent status: Secondary | ICD-10-CM | POA: Insufficient documentation

## 2015-09-26 DIAGNOSIS — I252 Old myocardial infarction: Secondary | ICD-10-CM | POA: Diagnosis not present

## 2015-09-27 ENCOUNTER — Encounter (HOSPITAL_COMMUNITY): Payer: Self-pay | Admitting: Nurse Practitioner

## 2015-09-27 NOTE — Progress Notes (Signed)
Patient ID: [name], female   DOB: 05-Feb-1956, 60 y.o.   MRN: [id number]     Primary Care Physician: Nicoletta Dress, MD Referring Physician:Dr. Cristela Felt Olthoff is a 60 y.o. female with a h/o tachy palpitations, with a sense of anxiety.She has previously been on amiodarone for NSVT. She had been hospitalized recently for heart failure but fluid status is now stable. When she was seen by Dr. Rayann Heman 2/17, she was found to be in new onset afib. She was started on xarelto and coreg was increased.  She was referred to the afib clinic for f/u. Ekg today shows SR. She is doing well on DOAC without bleeding issues. She has also been referred to research department for genetic AF and they will enroll pt today. She is also an established pt of the heart failure clinic. She denies alcohol, tobacco, large amounts of caffeine, Has OSA but usesCPAP on a regular basis.   Today, she denies symptoms of palpitations, chest pain, shortness of breath, orthopnea, PND, lower extremity edema, dizziness, presyncope, syncope, or neurologic sequela. The patient is tolerating medications without difficulties and is otherwise without complaint today.   Past Medical History  Diagnosis Date  . 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 DYSLIPIDEMIA 05/01/2009  . OBESITY 07/24/2007  . OBSTRUCTIVE SLEEP APNEA 07/24/2007    with CPAP compliance  . RESTLESS LEG SYNDROME 07/24/2007  . SYSTOLIC HEART FAILURE, CHRONIC 01/05/2009    a. chemo induced cardiomyopathy b. cMRI (02/2009) EF 45% c. Myoview 05/2010: EF 59%, no ischemia d. RHC (01/2012) RA 20, RV 55/13/22, PA 56/34 (44), PCWP 28, Fick CO/CI: 3.1 / 1.5, PVR 5.3 WU, PA sat 42% & 48% e. ECHO (10/2012) EF 20-25%, diff HK, MV calcified annulus f. ECHO (03/2014) EF 30-35%, sev HK, inferior/inferoseptal walls, mild MR  . 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 Hypertension   . History of breast cancer 2006    with return in 2008, s/p mastectomy, XRT and chemotherapy  . Osteoarthritis   .  Anxiety   . Depression     followed by a psychiatrist  . Asthma   . GERD (gastroesophageal reflux disease)   . Gastroparesis   . IBS (irritable bowel syndrome)   . Myocardial infarction ([place])   . ICD (implantable cardiac defibrillator) in place 11/10/2012    ICD (11/2012)  . COPD (chronic obstructive pulmonary disease) ([place])   . Persistent atrial fibrillation ([place]) 09/2014   Past Surgical History  Procedure Laterality Date  . Mastectomy      right  . Abdominal hysterectomy  1991  . Cardiac defibrillator placement  11/10/2012    SJM Fortify Assura VR implanted by Dr Rayann Heman for primary prevention  . Cholecystectomy    . Wrist surgery Bilateral   . Knee cartilage surgery    . Bi-ventricular implantable cardioverter defibrillator N/A 11/10/2012    SJM Forify Assura single chamber ICD  . Cardiac catheterization N/A 08/21/2015    Procedure: Right/Left Heart Cath and Coronary Angiography;  Surgeon: Jolaine Artist, MD;  Location: [facility];  Service: Cardiovascular;  Laterality: N/A;    Current Outpatient Prescriptions  Medication Sig Dispense Refill  . acetaminophen (TYLENOL ARTHRITIS PAIN) 650 MG CR tablet Take 1,300 mg by mouth every 8 (eight) hours as needed. pain    . albuterol (PROVENTIL HFA) 108 (90 BASE) MCG/ACT inhaler Inhale 2 puffs into the lungs every 6 (six) hours as needed. For shortness of breath    . amiodarone (PACERONE) 200 MG tablet Take  1 tablet (200 mg total) by mouth daily. 30 tablet 6  . atorvastatin (LIPITOR) 40 MG tablet Take 40 mg by mouth daily.      . busPIRone (BUSPAR) 30 MG tablet Take 30 mg by mouth 2 (two) times daily.     . carvedilol (COREG) 12.5 MG tablet Take 1 tablet (12.5 mg total) by mouth 2 (two) times daily with a meal. 180 tablet 3  . digoxin (LANOXIN) 0.125 MG tablet Take 1 tablet (0.125 mg total) by mouth every other day. 90 tablet 3  . DULoxetine (CYMBALTA) 30 MG capsule Take 30 mg by mouth daily.    . furosemide (LASIX) 20 MG tablet  40 mg twice a day 60 tablet 3  . Garlic 123XX123 MG CAPS Take 1 capsule by mouth daily. Reported on 08/19/2015    . Glucosamine 500 MG CAPS Take 1 capsule by mouth daily.      . Insulin Glargine (TOUJEO SOLOSTAR [place]) Inject 80 Units into the skin daily.    . insulin lispro (HUMALOG) 100 UNIT/ML injection Inject 40 Units into the skin 2 (two) times daily with a meal.     . Insulin Pen Needle (B-D ULTRAFINE III SHORT PEN) 31G X 8 MM MISC 18/day as directed dx 250.00 500 each 11  . ivabradine (CORLANOR) 5 MG TABS tablet Take 1 tablet (5 mg total) by mouth 2 (two) times daily with a meal. 60 tablet 3  . levothyroxine (SYNTHROID, LEVOTHROID) 75 MCG tablet Take 75 mcg by mouth daily before breakfast.     . metolazone (ZAROXOLYN) 2.5 MG tablet Take 2.5 mg by mouth as needed (for weight gain >3 lbs).    . mometasone (NASONEX) 50 MCG/ACT nasal spray Place 1 spray into both nostrils daily. allergies    . nitroGLYCERIN (NITROSTAT) 0.4 MG SL tablet Place 1 tablet (0.4 mg total) under the tongue every 5 (five) minutes as needed. For chest pain 25 tablet 3  . omega-3 acid ethyl esters (LOVAZA) 1 G capsule Take 2 g by mouth 2 (two) times daily.     . pantoprazole (PROTONIX) 40 MG tablet Take 40 mg by mouth 2 (two) times daily.      . potassium chloride SA (K-DUR,KLOR-CON) 20 MEQ tablet Take 2 tablets (40 mEq total) by mouth 2 (two) times daily. 120 tablet 6  . ramipril (ALTACE) 5 MG capsule Take 1 capsule (5 mg total) by mouth at bedtime. 30 capsule 6  . rivaroxaban (XARELTO) 20 MG TABS tablet Take 1 tablet (20 mg total) by mouth daily with supper. 30 tablet 11  . rOPINIRole (REQUIP) 1 MG tablet Take 1 mg by mouth at bedtime.      Marland Kitchen spironolactone (ALDACTONE) 25 MG tablet Take 25 mg by mouth daily.    . SUMAtriptan (IMITREX) 25 MG tablet Take 25 mg by mouth every 2 (two) hours as needed. For migraine    . tiotropium (SPIRIVA) 18 MCG inhalation capsule Place 18 mcg into inhaler and inhale daily.      . vitamin B-12  (CYANOCOBALAMIN) 1000 MCG tablet Take 1,000 mcg by mouth daily.    . vitamin C (ASCORBIC ACID) 500 MG tablet Take 500 mg by mouth daily.    . vitamin E 400 UNIT capsule Take 400 Units by mouth daily.     No current facility-administered medications for this encounter.    Allergies  Allergen Reactions  . Bee Venom Anaphylaxis  . Other Other (See Comments)    ANY TYPES OF METAL-CAUSES BLISTERS  SKIN AND ITCHING   . Aspirin Hives  . Ciprofloxacin Hives  . Codeine Nausea Only    "can take ONLY if she eats with med"  . Naproxen Hives  . Sulfonamide Derivatives Hives  . Nsaids Itching and Rash  . Tape Rash    Also allergic to metal    Social History   Social History  . Marital Status: Divorced    Spouse Name: N/A  . Number of Children: N/A  . Years of Education: N/A   Occupational History  . Disabled    Social History Main Topics  . Smoking status: Former Smoker    Quit date: 04/12/2001  . Smokeless tobacco: Never Used  . Alcohol Use: No  . Drug Use: No  . Sexual Activity: Not Currently   Other Topics Concern  . Not on file   Social History Narrative   Lives with mother and brother Philis Nettle          Family History  Problem Relation Age of Onset  . Diabetes Mellitus II Mother   . Coronary artery disease Father     CABG x 3  . Breast cancer Cousin   . Breast cancer Cousin   . Uterine cancer Cousin   . Lung cancer Mother   . Congestive Heart Failure Mother   . Hypertension Father   . Diabetes Mellitus II Father     ROS- All systems are reviewed and negative except as per the HPI above  Physical Exam: Filed Vitals:   09/26/15 1525  BP: 118/60  Pulse: 91  Height: 5\' 5"  (1.651 m)  Weight: 220 lb 6.4 oz (99.973 kg)    GEN- The patient is well appearing, alert and oriented x 3 today.   Head- normocephalic, atraumatic Eyes-  Sclera clear, conjunctiva pink Ears- hearing intact Oropharynx- clear Neck- supple, no JVP Lymph- no cervical  lymphadenopathy Lungs- Clear to ausculation bilaterally, normal work of breathing Heart- Regular rate and rhythm, no murmurs, rubs or gallops, PMI not laterally displaced GI- soft, NT, ND, + BS Extremities- no clubbing, cyanosis, or edema MS- no significant deformity or atrophy Skin- no rash or lesion Psych- euthymic mood, full affect Neuro- strength and sensation are intact  EKG-Sinus Rhythm with first degree AVB, LAD, RBBB, pr int 230 ms, QRS int 134 ms, QTc 492 ms Epic records reviewed  Assessment and Plan: 1. New onset afib SR today Continue amiodarone/ coreg Continue xarelto 20 mg q day Enrolled in genetic AF today  2. Chronic systolic dysfunction/HF Follow up with HF department as scheduled Fluid status stable Continue lasix Daily weights Avoid salt  3. ICD Per merlin/Dr. Allred   F/u in one month  Isham Smitherman C. Tausha Milhoan, [facility] [address] [place], [place] [address] [phone]

## 2015-09-29 DIAGNOSIS — E1140 Type 2 diabetes mellitus with diabetic neuropathy, unspecified: Secondary | ICD-10-CM | POA: Diagnosis not present

## 2015-10-03 ENCOUNTER — Encounter: Payer: Self-pay | Admitting: *Deleted

## 2015-10-03 ENCOUNTER — Ambulatory Visit (HOSPITAL_COMMUNITY)
Admission: RE | Admit: 2015-10-03 | Discharge: 2015-10-03 | Disposition: A | Discharge status: Home / Self Care | Payer: Medicare Other | Source: Ambulatory Visit | Attending: Internal Medicine | Admitting: Internal Medicine

## 2015-10-03 VITALS — BP 110/68 | HR 84 | Wt 221.8 lb

## 2015-10-03 DIAGNOSIS — J449 Chronic obstructive pulmonary disease, unspecified: Secondary | ICD-10-CM | POA: Insufficient documentation

## 2015-10-03 DIAGNOSIS — J45909 Unspecified asthma, uncomplicated: Secondary | ICD-10-CM | POA: Diagnosis not present

## 2015-10-03 DIAGNOSIS — Z853 Personal history of malignant neoplasm of breast: Secondary | ICD-10-CM | POA: Insufficient documentation

## 2015-10-03 DIAGNOSIS — F419 Anxiety disorder, unspecified: Secondary | ICD-10-CM | POA: Diagnosis not present

## 2015-10-03 DIAGNOSIS — F329 Major depressive disorder, single episode, unspecified: Secondary | ICD-10-CM | POA: Insufficient documentation

## 2015-10-03 DIAGNOSIS — I2510 Atherosclerotic heart disease of native coronary artery without angina pectoris: Secondary | ICD-10-CM | POA: Diagnosis not present

## 2015-10-03 DIAGNOSIS — I428 Other cardiomyopathies: Secondary | ICD-10-CM | POA: Insufficient documentation

## 2015-10-03 DIAGNOSIS — I5022 Chronic systolic (congestive) heart failure: Secondary | ICD-10-CM

## 2015-10-03 DIAGNOSIS — I472 Ventricular tachycardia: Secondary | ICD-10-CM | POA: Diagnosis not present

## 2015-10-03 DIAGNOSIS — K219 Gastro-esophageal reflux disease without esophagitis: Secondary | ICD-10-CM | POA: Insufficient documentation

## 2015-10-03 DIAGNOSIS — I10 Essential (primary) hypertension: Secondary | ICD-10-CM

## 2015-10-03 DIAGNOSIS — Z87891 Personal history of nicotine dependence: Secondary | ICD-10-CM | POA: Diagnosis not present

## 2015-10-03 DIAGNOSIS — K3184 Gastroparesis: Secondary | ICD-10-CM | POA: Insufficient documentation

## 2015-10-03 DIAGNOSIS — E119 Type 2 diabetes mellitus without complications: Secondary | ICD-10-CM | POA: Diagnosis not present

## 2015-10-03 DIAGNOSIS — Z8249 Family history of ischemic heart disease and other diseases of the circulatory system: Secondary | ICD-10-CM | POA: Insufficient documentation

## 2015-10-03 DIAGNOSIS — I252 Old myocardial infarction: Secondary | ICD-10-CM | POA: Insufficient documentation

## 2015-10-03 DIAGNOSIS — E669 Obesity, unspecified: Secondary | ICD-10-CM | POA: Diagnosis not present

## 2015-10-03 DIAGNOSIS — Z794 Long term (current) use of insulin: Secondary | ICD-10-CM | POA: Insufficient documentation

## 2015-10-03 DIAGNOSIS — E785 Hyperlipidemia, unspecified: Secondary | ICD-10-CM | POA: Insufficient documentation

## 2015-10-03 DIAGNOSIS — E1143 Type 2 diabetes mellitus with diabetic autonomic (poly)neuropathy: Secondary | ICD-10-CM | POA: Insufficient documentation

## 2015-10-03 DIAGNOSIS — Z833 Family history of diabetes mellitus: Secondary | ICD-10-CM | POA: Insufficient documentation

## 2015-10-03 DIAGNOSIS — N63 Unspecified lump in breast: Secondary | ICD-10-CM | POA: Insufficient documentation

## 2015-10-03 DIAGNOSIS — G2581 Restless legs syndrome: Secondary | ICD-10-CM | POA: Insufficient documentation

## 2015-10-03 DIAGNOSIS — Z79899 Other long term (current) drug therapy: Secondary | ICD-10-CM | POA: Insufficient documentation

## 2015-10-03 DIAGNOSIS — I110 Hypertensive heart disease with heart failure: Secondary | ICD-10-CM | POA: Insufficient documentation

## 2015-10-03 DIAGNOSIS — Z95810 Presence of automatic (implantable) cardiac defibrillator: Secondary | ICD-10-CM | POA: Diagnosis not present

## 2015-10-03 DIAGNOSIS — G4733 Obstructive sleep apnea (adult) (pediatric): Secondary | ICD-10-CM | POA: Diagnosis not present

## 2015-10-03 DIAGNOSIS — Z9221 Personal history of antineoplastic chemotherapy: Secondary | ICD-10-CM | POA: Diagnosis not present

## 2015-10-03 DIAGNOSIS — Z006 Encounter for examination for normal comparison and control in clinical research program: Secondary | ICD-10-CM

## 2015-10-03 LAB — BASIC METABOLIC PANEL
Anion gap: 17 — ABNORMAL HIGH (ref 5–15)
BUN: 27 mg/dL — ABNORMAL HIGH (ref 6–20)
CALCIUM: 10.8 mg/dL — AB (ref 8.9–10.3)
CO2: 30 mmol/L (ref 22–32)
CREATININE: 1.17 mg/dL — AB (ref 0.44–1.00)
Chloride: 93 mmol/L — ABNORMAL LOW (ref 101–111)
GFR calc non Af Amer: 50 mL/min — ABNORMAL LOW (ref >60)
GFR, EST AFRICAN AMERICAN: 57 mL/min — AB (ref >60)
Glucose, Bld: 222 mg/dL — ABNORMAL HIGH (ref 65–99)
Potassium: 3.6 mmol/L (ref 3.5–5.1)
Sodium: 140 mmol/L (ref 135–145)

## 2015-10-03 LAB — MAGNESIUM: Magnesium: 1.6 mg/dL — ABNORMAL LOW (ref 1.7–2.4)

## 2015-10-03 MED ORDER — DOCUSATE SODIUM 100 MG PO CAPS: 300.0000 mg | ORAL_CAPSULE | Freq: Every day | ORAL | Status: AC

## 2015-10-03 NOTE — Progress Notes (Signed)
Patient meet criteria for the GENETIC-AF study. I spoke about the study with her last week when she was here for an appointment with Roderic Palau in the afib clinic. She was back today and we discussed the study further. She had the opportunity to read through the consent and ask questions. No study related procedures were done prior to the consent being signed. The patient signed the consent and a copy was given to the patient. The consent was signed at 10:50am. We will notify the patient when the results are received.

## 2015-10-03 NOTE — Progress Notes (Signed)
Patient ID: [name], female   DOB: 26-Aug-1955, 60 y.o.   MRN: [id number]  Endocrinologist: Dr Loanne Drilling  Oncologist: Dr. Humphrey Rolls PCP: [name], NP ([place]) Primary HF MD: Dr Haroldine Laws  HPI [name] is a 60  year old female with history of breast cancer, obesity, HTN, DM, IBS, HLD, OSA, chemo induced cardiomyopathy s/p ICD and chronic systolic HF.    She had chemo induced cardiomyopathy.  Her EF has fallen from 45% per Cardiac MRI in July 2010 to 20-25% in 10/2012. S/P single chamber ICD (St Jude) implantation on 11/10/12 by Dr. Rayann Heman.    Admitted 08/19/15 with chest pain. Had [facility] with mild CAD and increased filling pressures. CPX placed on hold due to breast mass. She was told to have breast biopsy ASAP with her history of breast cancer x 2. Discharged on lasix 40 mg po twice daily, carvedilol 6.25 mg twice a day, ramipril 5 mg at bed time, dig 0.125 mg daily and ivabradine 5 mg twice a day. Discharge weight was 220 pounds.    January 2017 Had breast biopsy last week with negative results.   She returns for  follow up. Overall feeling ok. Denies SOB/PND/Orthopnea. She is taking metolazone about 2 times a week. Weight at home 216-224 pounds. Able to walk 1/2 mile at her own pace. Uses CPAP at night Taking all medications.  Following low salt diet. Having difficulty managing glucose. She followed closely by her PCP.    CPX 2017  Peak VO2: 12.7 (73% predicted peak VO2) VE/VCO2 slope: 32.3 OUES: 1.62 Peak RER: 1.08 Exercise testing with gas exchange demonstrates a mild to moderate functional impairment when compared to matched sedentary norms. At peak exercise, patient appears to have mild circulatory limitation, Her body habitus and related restrictive lung physiology are also likely contributing to her exercise intolerance. Chronotropic incompetence was also present.   RHC/LCH  08/20/2014   Mid Cx lesion, 30% stenosed.  3rd Mrg lesion, 30%  stenosed. Findings: RA = 12 RV = 55/6/12 PA = 54/21 (34) PCW = 22 Ao 93/58 (71) LV 106/8/26 Fick cardiac output/index = 4.2/2.1 PVR = 2.8 SVR = 1115 FA sat = 93% PA sat = 54%, 53% Assessment: 1. Very mild CAD 2. Mild to moderately increased filling pressures 3. Severe LV dysfunction due to NICM EF 20%medications.   [place] on 01/30/12 showed decompensated HF with low cardiac output consistent with cardiogenic shock.  [place] 6/27  RA = 20  RV = 55/13/22  PA =56/34 (44)  PCW = 28  Fick cardiac output/index = 3.1/1.5  PVR = 5.3 [place]  FA sat = 91%  PA sat = 42%, 48%  SVC sat = 46%  Echo 08/12/11: Left ventricle: mildly dilated, EF 20% to 25%. Diffuse hypokinesis. Doppler parameters are consistent with restrictive physiology, indicative of decreased left ventricular diastolic compliance and/or increased left atrial pressure. Left atrium was moderately dilated. Right atrium mildly dilated. 04/09/12 ECHO EF 30-35% LV mildly dilated  Hyopkinesis 10/07/12 ECHO EF 20-25% RV ok. 03/31/14: ECHO EF 30-35%, severe HK inferior/inferoseptal walls, mild MR, LA mod dilated, RA mildly dilated 06/28/2015: ECHO EF 25% RV mildly dilated. Severe HK inferior wall.   Myoview in 05/2010 EF 59%. No ischemia.    ROS: All systems negative except as listed in HPI, PMH and Problem List.  Social History   Social History  . Marital Status: Divorced    Spouse Name: N/A  . Number of Children: N/A  . Years of  Education: N/A   Occupational History  . Disabled    Social History Main Topics  . Smoking status: Former Smoker    Quit date: 04/12/2001  . Smokeless tobacco: Never Used  . Alcohol Use: No  . Drug Use: No  . Sexual Activity: Not Currently   Other Topics Concern  . Not on file   Social History Narrative   Lives with mother and brother [name]         Family History  Problem Relation Age of Onset  . Diabetes Mellitus II Mother   . Coronary artery disease Father     CABG x 3  .  Breast cancer Cousin   . Breast cancer Cousin   . Uterine cancer Cousin   . Lung cancer Mother   . Congestive Heart Failure Mother   . Hypertension Father   . Diabetes Mellitus II Father     Past Medical History  Diagnosis Date  . 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 DYSLIPIDEMIA 05/01/2009  . OBESITY 07/24/2007  . OBSTRUCTIVE SLEEP APNEA 07/24/2007    with CPAP compliance  . RESTLESS LEG SYNDROME 07/24/2007  . SYSTOLIC HEART FAILURE, CHRONIC 01/05/2009    a. chemo induced cardiomyopathy b. cMRI (02/2009) EF 45% c. Myoview 05/2010: EF 59%, no ischemia d. RHC (01/2012) RA 20, RV 55/13/22, PA 56/34 (44), PCWP 28, Fick CO/CI: 3.1 / 1.5, PVR 5.3 WU, PA sat 42% & 48% e. ECHO (10/2012) EF 20-25%, diff HK, MV calcified annulus f. ECHO (03/2014) EF 30-35%, sev HK, inferior/inferoseptal walls, mild MR  . 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 Hypertension   . History of breast cancer 2006    with return in 2008, s/p mastectomy, XRT and chemotherapy  . Osteoarthritis   . Anxiety   . Depression     followed by a psychiatrist  . Asthma   . GERD (gastroesophageal reflux disease)   . Gastroparesis   . IBS (irritable bowel syndrome)   . Myocardial infarction ([place])   . ICD (implantable cardiac defibrillator) in place 11/10/2012    ICD (11/2012)  . COPD (chronic obstructive pulmonary disease) ([place])   . Persistent atrial fibrillation ([place]) 09/2014    Current Outpatient Prescriptions  Medication Sig Dispense Refill  . acetaminophen (TYLENOL ARTHRITIS PAIN) 650 MG CR tablet Take 1,300 mg by mouth every 8 (eight) hours as needed. pain    . albuterol (PROVENTIL HFA) 108 (90 BASE) MCG/ACT inhaler Inhale 2 puffs into the lungs every 6 (six) hours as needed. For shortness of breath    . amiodarone (PACERONE) 200 MG tablet Take 1 tablet (200 mg total) by mouth daily. 30 tablet 6  . atorvastatin (LIPITOR) 40 MG tablet Take 40 mg by mouth daily.      . busPIRone (BUSPAR) 30 MG tablet Take 30 mg by mouth 2 (two) times daily.     .  carvedilol (COREG) 12.5 MG tablet Take 1 tablet (12.5 mg total) by mouth 2 (two) times daily with a meal. 180 tablet 3  . digoxin (LANOXIN) 0.125 MG tablet Take 1 tablet (0.125 mg total) by mouth every other day. 90 tablet 3  . DULoxetine (CYMBALTA) 30 MG capsule Take 30 mg by mouth daily.    . furosemide (LASIX) 20 MG tablet 40 mg twice a day 60 tablet 3  . Garlic 123XX123 MG CAPS Take 1 capsule by mouth daily. Reported on 08/19/2015    . Glucosamine 500 MG CAPS Take 1 capsule by mouth daily.      . Insulin Glargine (  TOUJEO SOLOSTAR [place]) Inject 80 Units into the skin daily.    . insulin lispro (HUMALOG) 100 UNIT/ML injection Inject 40 Units into the skin 2 (two) times daily with a meal.     . Insulin Pen Needle (B-D ULTRAFINE III SHORT PEN) 31G X 8 MM MISC 18/day as directed dx 250.00 500 each 11  . ivabradine (CORLANOR) 5 MG TABS tablet Take 1 tablet (5 mg total) by mouth 2 (two) times daily with a meal. 60 tablet 3  . levothyroxine (SYNTHROID, LEVOTHROID) 75 MCG tablet Take 75 mcg by mouth daily before breakfast.     . metolazone (ZAROXOLYN) 2.5 MG tablet Take 2.5 mg by mouth as needed (for weight gain >3 lbs).    . mometasone (NASONEX) 50 MCG/ACT nasal spray Place 1 spray into both nostrils daily. allergies    . nitroGLYCERIN (NITROSTAT) 0.4 MG SL tablet Place 1 tablet (0.4 mg total) under the tongue every 5 (five) minutes as needed. For chest pain 25 tablet 3  . omega-3 acid ethyl esters (LOVAZA) 1 G capsule Take 2 g by mouth 2 (two) times daily.     . pantoprazole (PROTONIX) 40 MG tablet Take 40 mg by mouth 2 (two) times daily.      . potassium chloride SA (K-DUR,KLOR-CON) 20 MEQ tablet Take 2 tablets (40 mEq total) by mouth 2 (two) times daily. 120 tablet 6  . ramipril (ALTACE) 5 MG capsule Take 1 capsule (5 mg total) by mouth at bedtime. 30 capsule 6  . rivaroxaban (XARELTO) 20 MG TABS tablet Take 1 tablet (20 mg total) by mouth daily with supper. 30 tablet 11  . rOPINIRole (REQUIP) 1 MG tablet  Take 1 mg by mouth at bedtime.      Marland Kitchen spironolactone (ALDACTONE) 25 MG tablet Take 25 mg by mouth daily.    . SUMAtriptan (IMITREX) 25 MG tablet Take 25 mg by mouth every 2 (two) hours as needed. For migraine    . tiotropium (SPIRIVA) 18 MCG inhalation capsule Place 18 mcg into inhaler and inhale daily.      . vitamin B-12 (CYANOCOBALAMIN) 1000 MCG tablet Take 1,000 mcg by mouth daily.    . vitamin C (ASCORBIC ACID) 500 MG tablet Take 500 mg by mouth daily.    . vitamin E 400 UNIT capsule Take 400 Units by mouth daily.     No current facility-administered medications for this encounter.   Filed Vitals:   10/03/15 1050  BP: 110/68  Pulse: 84  Weight: 221 lb 12.8 oz (100.608 kg)  SpO2: 97%     PHYSICAL EXAM: General:  Obese, Well appearing. Ambulated in the clinic without difficulty   HEENT: normal  Neck: supple. JVP hard to see but dose not appear elevated, carotids 2+ bilaterally; no bruits. No lymphadenopathy or thryomegaly appreciated. Cor: PMI normal. Regular rate & rhythm. no S3. Lungs: clear Abdomen: soft, obese, nontender, no distention. No hepatosplenomegaly. No bruits or masses. Good bowel sounds. Extremities: no cyanosis, clubbing, rash, no edema Neuro: alert & orientedx3, cranial nerves grossly intact. Moves all 4 extremities w/o difficulty. Affect pleasant.   ASSESSMENT & PLAN:  1) Chronic systolic HF, NICM thought to be chemo induced, s/p ST Jude  ICD. Most Recent ECHO 06/28/2015  EF ~20%. RV mildly dilated.  - Has NYHA II-III symptoms.  Will continue lasix 40 mg  BID. Continue spiro 25 mg daily.  - Continue coreg 6.25 mg twice a day.   - Continue ramipril 5 mg qhs.   -Continue  digoxin 0.125 mg daily . Dig level 0.7 on 08/2015  -Continue corlanor to 5 mg twice a day.   - Check BMET and Mag today.   -CPX result with mild -mod circulatory component. Plan to repeat 6 month- 1 year.    - Reinforced the need and importance of daily weights, a low sodium diet, and fluid  restriction (less than 2 L a day). Instructed to call the HF clinic if weight increases more than 3 lbs overnight or 5 lbs in a week.  2) HTN - Stable.  Continue current medications.Marland Kitchen  3) OSA - Compliant with CPAP nightly. 4) HLD - Followed by PCP.  5) Obesity - Limiting portions. Needs to lose weight.   6) CAD - mild nonobstructive CAD on 2017 cath. Will continue statin, BB and ACE-I. No s/s of ischemia. 7) NSVT- Continue  amio to 200 mg daily. Needs yearly eye exam.  TSH January 2017 ok.  8) Breast Mass- Had biopsy. Negative breast biopsy January 2017.       Follow up in 6 weeks.   Amonie Wisser NP-C 10:52 AM

## 2015-10-03 NOTE — Patient Instructions (Signed)
Follow-up in 6 weeks

## 2015-10-06 ENCOUNTER — Telehealth: Payer: Self-pay | Admitting: Cardiology

## 2015-10-06 ENCOUNTER — Telehealth: Payer: Self-pay | Admitting: *Deleted

## 2015-10-06 NOTE — Telephone Encounter (Signed)
Spoke w/ pt and requested she send a manual transmission b/c her home monitor has not updated in at least 8 days. Pt verbalized understanding and said she would do that right now.

## 2015-10-06 NOTE — Telephone Encounter (Signed)
Called [name] to let her know about her GENETIC-AF lab results. She does not have the Arg/Arg gene and will not move forward in the trial.

## 2015-10-06 NOTE — Telephone Encounter (Signed)
Spoke w/ pt and informed her that her remote transmission was received. Pt verbalized understanding.  

## 2015-10-06 NOTE — Telephone Encounter (Signed)
She wants to know if you received her transmission?

## 2015-10-16 ENCOUNTER — Telehealth: Payer: Self-pay | Admitting: Cardiology

## 2015-10-16 ENCOUNTER — Telehealth (HOSPITAL_COMMUNITY): Payer: Self-pay | Admitting: *Deleted

## 2015-10-16 NOTE — Telephone Encounter (Signed)
Pt called to let us know that she had GI bug Fri/Sat with vomiting and diarrhea, she states it has all resolved now but she was drinking extra fluids and her wt is up quite a bit now.  She reports she did take a metolazone this AM and is hoping that will work.  She also reports that she had funny feeling at her defib this weekend and is unsure if it shocked her or not, she has already contacted device clinic and sent a transmission regarding this.  Offered pt an appt for labs today but she states she has no transportation.  Advised pt to watch fluid intake today and call us in the AM with update, she is agreeble

## 2015-10-16 NOTE — Telephone Encounter (Signed)
Pt called in and stated that she felt a vibration from her implanted device over the weekend. Instructed pt to send a remote transmission using home monitor. Pt verbalized understanding.

## 2015-10-17 NOTE — Telephone Encounter (Signed)
Spoke w/ pt and informed her that we still have not received her remote transmission. Attempted to help pt trouble shoot her home monitor. After an unsuccessful attempt I instructed pt to call tech services. Pt verbalized understanding.

## 2015-10-17 NOTE — Telephone Encounter (Signed)
Pt called to report she is feeling much better, her wt is down 9 lb from yesterday, she will continue to monitor and call back as needed

## 2015-10-18 ENCOUNTER — Other Ambulatory Visit (HOSPITAL_COMMUNITY): Payer: Self-pay | Admitting: *Deleted

## 2015-10-18 DIAGNOSIS — R002 Palpitations: Secondary | ICD-10-CM

## 2015-10-18 MED ORDER — NITROGLYCERIN 0.4 MG SL SUBL: 0.4000 mg | SUBLINGUAL_TABLET | SUBLINGUAL | Status: DC | PRN

## 2015-10-24 ENCOUNTER — Ambulatory Visit (HOSPITAL_COMMUNITY)
Admission: RE | Admit: 2015-10-24 | Discharge: 2015-10-24 | Disposition: A | Discharge status: Home / Self Care | Payer: Medicare Other | Source: Ambulatory Visit | Attending: Nurse Practitioner | Admitting: Nurse Practitioner

## 2015-10-24 ENCOUNTER — Encounter: Payer: Self-pay | Admitting: Internal Medicine

## 2015-10-24 ENCOUNTER — Encounter (HOSPITAL_COMMUNITY): Payer: Self-pay | Admitting: Nurse Practitioner

## 2015-10-24 VITALS — BP 106/62 | HR 83 | Ht 65.0 in | Wt 225.4 lb

## 2015-10-24 DIAGNOSIS — I4891 Unspecified atrial fibrillation: Secondary | ICD-10-CM | POA: Insufficient documentation

## 2015-10-24 DIAGNOSIS — I480 Paroxysmal atrial fibrillation: Secondary | ICD-10-CM | POA: Diagnosis not present

## 2015-10-24 LAB — COMPREHENSIVE METABOLIC PANEL
ALK PHOS: 56 U/L (ref 38–126)
ALT: 22 U/L (ref 14–54)
ANION GAP: 11 (ref 5–15)
AST: 24 U/L (ref 15–41)
Albumin: 3.2 g/dL — ABNORMAL LOW (ref 3.5–5.0)
BUN: 16 mg/dL (ref 6–20)
CALCIUM: 9.7 mg/dL (ref 8.9–10.3)
CHLORIDE: 96 mmol/L — AB (ref 101–111)
CO2: 31 mmol/L (ref 22–32)
Creatinine, Ser: 0.98 mg/dL (ref 0.44–1.00)
GFR calc non Af Amer: >60 mL/min (ref >60)
Glucose, Bld: 180 mg/dL — ABNORMAL HIGH (ref 65–99)
Potassium: 3.6 mmol/L (ref 3.5–5.1)
SODIUM: 138 mmol/L (ref 135–145)
Total Bilirubin: 0.8 mg/dL (ref 0.3–1.2)
Total Protein: 6.8 g/dL (ref 6.5–8.1)

## 2015-10-24 LAB — TSH: TSH: 3.876 u[IU]/mL (ref 0.350–4.500)

## 2015-10-24 NOTE — Progress Notes (Signed)
Patient ID: [name], female   DOB: 06/24/1956, 60 y.o.   MRN: [id number]     Primary Care Physician: Joanne Dress, MD Referring Physician:Dr. Cristela Felt [name] is a 59 y.o. female with a h/o tachy palpitations, with a sense of anxiety.She has previously been on amiodarone for NSVT. She had been hospitalized 1/17 for heart failure but fluid status is now stable. When she was seen by Dr. Rayann Heman 2/17, she was found to be in new onset afib. She was started on xarelto and coreg was increased.  She was referred to the afib clinic for f/u. Ekg showed SR. She is doing well on DOAC without bleeding issues. She was  referred to research department for genetic AF, but not meet criteria for study. She is also an established pt of the heart failure clinic. She denies alcohol, tobacco, large amounts of caffeine. Has OSA but usesCPAP on a regular basis.   Returns to the afib clinic, in [place]. Has not noticed but a couple of episodes of heart racing which were nonsustained. She had a stomach virus the second week of March for several days and is trying to get her strength back from that. She had an hour of a vibration in her left arm and shoulder, which she tried to send in a report but her internet was causing the problem. Interrogation today shows SR, normal device, with no detected events on the day that the pt described. The [employer] rep turned on the vibration mode on the device, but she said what she experienced felt different.   Today, she denies symptoms of palpitations, chest pain, shortness of breath, orthopnea, PND, lower extremity edema, dizziness, presyncope, syncope, or neurologic sequela. The patient is tolerating medications without difficulties and is otherwise without complaint today.   Past Medical History  Diagnosis Date  . 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 DYSLIPIDEMIA 05/01/2009  . OBESITY 07/24/2007  . OBSTRUCTIVE SLEEP APNEA 07/24/2007    with CPAP compliance  . RESTLESS LEG SYNDROME  07/24/2007  . SYSTOLIC HEART FAILURE, CHRONIC 01/05/2009    a. chemo induced cardiomyopathy b. cMRI (02/2009) EF 45% c. Myoview 05/2010: EF 59%, no ischemia d. RHC (01/2012) RA 20, RV 55/13/22, PA 56/34 (44), PCWP 28, Fick CO/CI: 3.1 / 1.5, PVR 5.3 WU, PA sat 42% & 48% e. ECHO (10/2012) EF 20-25%, diff HK, MV calcified annulus f. ECHO (03/2014) EF 30-35%, sev HK, inferior/inferoseptal walls, mild MR  . 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 Hypertension   . History of breast cancer 2006    with return in 2008, s/p mastectomy, XRT and chemotherapy  . Osteoarthritis   . Anxiety   . Depression     followed by a psychiatrist  . Asthma   . GERD (gastroesophageal reflux disease)   . Gastroparesis   . IBS (irritable bowel syndrome)   . Myocardial infarction ([place])   . ICD (implantable cardiac defibrillator) in place 11/10/2012    ICD (11/2012)  . COPD (chronic obstructive pulmonary disease) ([place])   . Persistent atrial fibrillation ([place]) 09/2014   Past Surgical History  Procedure Laterality Date  . Mastectomy      right  . Abdominal hysterectomy  1991  . Cardiac defibrillator placement  11/10/2012    SJM Fort[employer] Assura VR implanted by Dr Rayann Heman for primary prevention  . Cholecystectomy    . Wrist surgery Bilateral   . Knee cartilage surgery    . Bi-ventricular implantable cardioverter defibrillator N/A 11/10/2012    [employer]  Assura single chamber ICD  . Cardiac catheterization N/A 08/21/2015    Procedure: Right/Left Heart Cath and Coronary Angiography;  Surgeon: Jolaine Artist, MD;  Location: [facility];  Service: Cardiovascular;  Laterality: N/A;    Current Outpatient Prescriptions  Medication Sig Dispense Refill  . acetaminophen (TYLENOL ARTHRITIS PAIN) 650 MG CR tablet Take 1,300 mg by mouth every 8 (eight) hours as needed. pain    . albuterol (PROVENTIL HFA) 108 (90 BASE) MCG/ACT inhaler Inhale 2 puffs into the lungs every 6 (six) hours as needed. For shortness of breath    .  amiodarone (PACERONE) 200 MG tablet Take 1 tablet (200 mg total) by mouth daily. 30 tablet 6  . atorvastatin (LIPITOR) 40 MG tablet Take 40 mg by mouth daily.      . busPIRone (BUSPAR) 30 MG tablet Take 30 mg by mouth 2 (two) times daily.     . carvedilol (COREG) 12.5 MG tablet Take 1 tablet (12.5 mg total) by mouth 2 (two) times daily with a meal. 180 tablet 3  . digoxin (LANOXIN) 0.125 MG tablet Take 1 tablet (0.125 mg total) by mouth every other day. 90 tablet 3  . docusate sodium (COLACE) 100 MG capsule Take 3 capsules (300 mg total) by mouth at bedtime. 10 capsule 0  . DULoxetine (CYMBALTA) 30 MG capsule Take 30 mg by mouth daily.    . furosemide (LASIX) 20 MG tablet 40 mg twice a day 60 tablet 3  . Garlic 123XX123 MG CAPS Take 1 capsule by mouth daily. Reported on 08/19/2015    . Glucosamine 500 MG CAPS Take 1 capsule by mouth daily.      . Insulin Glargine (TOUJEO SOLOSTAR [place]) Inject 110 Units into the skin daily.     . insulin lispro (HUMALOG) 100 UNIT/ML injection Inject 40 Units into the skin 2 (two) times daily with a meal.     . Insulin Pen Needle (B-D ULTRAFINE III SHORT PEN) 31G X 8 MM MISC 18/day as directed dx 250.00 500 each 11  . ivabradine (CORLANOR) 5 MG TABS tablet Take 1 tablet (5 mg total) by mouth 2 (two) times daily with a meal. 60 tablet 3  . levothyroxine (SYNTHROID, LEVOTHROID) 75 MCG tablet Take 75 mcg by mouth daily before breakfast.     . metolazone (ZAROXOLYN) 2.5 MG tablet Take 2.5 mg by mouth as needed (for weight gain >3 lbs).    . mometasone (NASONEX) 50 MCG/ACT nasal spray Place 1 spray into both nostrils daily. allergies    . nitroGLYCERIN (NITROSTAT) 0.4 MG SL tablet Place 1 tablet (0.4 mg total) under the tongue every 5 (five) minutes as needed. For chest pain 25 tablet 3  . omega-3 acid ethyl esters (LOVAZA) 1 G capsule Take 2 g by mouth 2 (two) times daily.     . pantoprazole (PROTONIX) 40 MG tablet Take 40 mg by mouth 2 (two) times daily.      . potassium  chloride SA (K-DUR,KLOR-CON) 20 MEQ tablet Take 2 tablets (40 mEq total) by mouth 2 (two) times daily. 120 tablet 6  . ramipril (ALTACE) 5 MG capsule Take 1 capsule (5 mg total) by mouth at bedtime. 30 capsule 6  . rivaroxaban (XARELTO) 20 MG TABS tablet Take 1 tablet (20 mg total) by mouth daily with supper. 30 tablet 11  . rOPINIRole (REQUIP) 1 MG tablet Take 1 mg by mouth at bedtime.      Marland Kitchen spironolactone (ALDACTONE) 25 MG tablet Take 25  mg by mouth daily.    . SUMAtriptan (IMITREX) 25 MG tablet Take 25 mg by mouth every 2 (two) hours as needed. For migraine    . tiotropium (SPIRIVA) 18 MCG inhalation capsule Place 18 mcg into inhaler and inhale daily.      . vitamin B-12 (CYANOCOBALAMIN) 1000 MCG tablet Take 1,000 mcg by mouth daily.    . vitamin C (ASCORBIC ACID) 500 MG tablet Take 500 mg by mouth daily.    . vitamin E 400 UNIT capsule Take 400 Units by mouth daily.     No current facility-administered medications for this encounter.    Allergies  Allergen Reactions  . Bee Venom Anaphylaxis  . Other Other (See Comments)    ANY TYPES OF METAL-CAUSES BLISTERS SKIN AND ITCHING   . Aspirin Hives  . Ciprofloxacin Hives  . Codeine Nausea Only    "can take ONLY if she eats with med"  . Naproxen Hives  . Sulfonamide Derivatives Hives  . Nsaids Itching and Rash  . Tape Rash    Also allergic to metal    Social History   Social History  . Marital Status: Divorced    Spouse Name: N/A  . Number of Children: N/A  . Years of Education: N/A   Occupational History  . Disabled    Social History Main Topics  . Smoking status: Former Smoker    Quit date: 04/12/2001  . Smokeless tobacco: Never Used  . Alcohol Use: No  . Drug Use: No  . Sexual Activity: Not Currently   Other Topics Concern  . Not on file   Social History Narrative   Lives with mother and brother Philis Nettle          Family History  Problem Relation Age of Onset  . Diabetes Mellitus II Mother   .  Coronary artery disease Father     CABG x 3  . Breast cancer Cousin   . Breast cancer Cousin   . Uterine cancer Cousin   . Lung cancer Mother   . Congestive Heart Failure Mother   . Hypertension Father   . Diabetes Mellitus II Father     ROS- All systems are reviewed and negative except as per the HPI above  Physical Exam: Filed Vitals:   10/24/15 0942  BP: 106/62  Pulse: 83  Height: 5\' 5"  (1.651 m)  Weight: 225 lb 6.4 oz (102.241 kg)    GEN- The patient is well appearing, alert and oriented x 3 today.   Head- normocephalic, atraumatic Eyes-  Sclera clear, conjunctiva pink Ears- hearing intact Oropharynx- clear Neck- supple, no JVP Lymph- no cervical lymphadenopathy Lungs- Clear to ausculation bilaterally, normal work of breathing Heart- Regular rate and rhythm, no murmurs, rubs or gallops, PMI not laterally displaced GI- soft, NT, ND, + BS Extremities- no clubbing, cyanosis, or edema MS- no significant deformity or atrophy Skin- no rash or lesion Psych- euthymic mood, full affect Neuro- strength and sensation are intact  EKG-Sinus Rhythm with first degree AVB, PVC's, LAD, RBBB, pr int 218 ms, QRS int 142 ms, QTc 582 ms Epic records reviewed  Assessment and Plan: 1. New onset afib SR today Continue amiodarone/ coreg Continue xarelto 20 mg q day Cmet, tsh today  2. Chronic systolic dysfunction/HF Follow up with HF department as scheduled Fluid status stable Continue lasix Daily weights Avoid salt  3. ICD Interrogated today, normal functioning device, no adverse events recorded   F/u in 3 month  Butch Penny C. Kayleen Memos, ANP-C  [facility] [address] [place], [place] [address] [phone]

## 2015-10-25 NOTE — Telephone Encounter (Signed)
Followed up w/ pt about her device vibrating. She informed me that she was interrogated at her OV appt w/ afib clinic on 10-24-15. According to provider note pt device was interrogated and normal device function was noted. Instructed pt to call me back later this week or next week and we will trouble shoot her home monitor. Pt verbalized understanding.

## 2015-11-02 DIAGNOSIS — T171XXA Foreign body in nostril, initial encounter: Secondary | ICD-10-CM | POA: Diagnosis not present

## 2015-11-02 DIAGNOSIS — Z5321 Procedure and treatment not carried out due to patient leaving prior to being seen by health care provider: Secondary | ICD-10-CM | POA: Diagnosis not present

## 2015-11-06 ENCOUNTER — Telehealth: Payer: Self-pay | Admitting: Cardiology

## 2015-11-06 DIAGNOSIS — L508 Other urticaria: Secondary | ICD-10-CM | POA: Diagnosis not present

## 2015-11-06 DIAGNOSIS — G2581 Restless legs syndrome: Secondary | ICD-10-CM | POA: Diagnosis not present

## 2015-11-06 NOTE — Telephone Encounter (Signed)
LMOVM requesting that pt send manual transmission b/c home monitor has not updated in at least 8 days.   

## 2015-11-10 DIAGNOSIS — L508 Other urticaria: Secondary | ICD-10-CM | POA: Diagnosis not present

## 2015-11-10 DIAGNOSIS — E1140 Type 2 diabetes mellitus with diabetic neuropathy, unspecified: Secondary | ICD-10-CM | POA: Diagnosis not present

## 2015-11-11 ENCOUNTER — Telehealth: Payer: Self-pay | Admitting: Internal Medicine

## 2015-11-11 NOTE — Telephone Encounter (Signed)
Called with ongoing itching. Tried to switch insulin but this didn't help. Thinks it might be Xarelto. Minimal rash. Will continue atarax over the weekend. On Monday will give her samples of Elquis and see if switching from Xarelto to Eliquis helps. Will likely need to see dermatologist.

## 2015-11-13 NOTE — Telephone Encounter (Signed)
Per Kathlee Nations, PharmD pt should be on Eliquis 5 mg Twice daily.  Spoke w/pt she is aware and states she has an appt w/us tomorrow and would like to pick up samples of Eliquis at that time.

## 2015-11-14 ENCOUNTER — Inpatient Hospital Stay (HOSPITAL_COMMUNITY)
Admission: EM | Admit: 2015-11-14 | Discharge: 2015-11-18 | DRG: 378 | Disposition: A | Discharge status: Home / Self Care | Payer: Medicare Other | Attending: Family Medicine | Admitting: Family Medicine

## 2015-11-14 ENCOUNTER — Emergency Department (HOSPITAL_COMMUNITY): Payer: Medicare Other

## 2015-11-14 ENCOUNTER — Encounter (HOSPITAL_COMMUNITY): Payer: Self-pay

## 2015-11-14 DIAGNOSIS — Z853 Personal history of malignant neoplasm of breast: Secondary | ICD-10-CM

## 2015-11-14 DIAGNOSIS — K921 Melena: Secondary | ICD-10-CM | POA: Diagnosis not present

## 2015-11-14 DIAGNOSIS — D124 Benign neoplasm of descending colon: Secondary | ICD-10-CM | POA: Diagnosis not present

## 2015-11-14 DIAGNOSIS — E785 Hyperlipidemia, unspecified: Secondary | ICD-10-CM | POA: Diagnosis present

## 2015-11-14 DIAGNOSIS — I340 Nonrheumatic mitral (valve) insufficiency: Secondary | ICD-10-CM | POA: Diagnosis present

## 2015-11-14 DIAGNOSIS — K922 Gastrointestinal hemorrhage, unspecified: Secondary | ICD-10-CM | POA: Diagnosis not present

## 2015-11-14 DIAGNOSIS — I447 Left bundle-branch block, unspecified: Secondary | ICD-10-CM | POA: Diagnosis not present

## 2015-11-14 DIAGNOSIS — I252 Old myocardial infarction: Secondary | ICD-10-CM | POA: Diagnosis not present

## 2015-11-14 DIAGNOSIS — G4733 Obstructive sleep apnea (adult) (pediatric): Secondary | ICD-10-CM | POA: Diagnosis present

## 2015-11-14 DIAGNOSIS — I5022 Chronic systolic (congestive) heart failure: Secondary | ICD-10-CM | POA: Diagnosis present

## 2015-11-14 DIAGNOSIS — Z7901 Long term (current) use of anticoagulants: Secondary | ICD-10-CM

## 2015-11-14 DIAGNOSIS — D649 Anemia, unspecified: Secondary | ICD-10-CM | POA: Diagnosis not present

## 2015-11-14 DIAGNOSIS — I481 Persistent atrial fibrillation: Secondary | ICD-10-CM | POA: Diagnosis present

## 2015-11-14 DIAGNOSIS — K589 Irritable bowel syndrome without diarrhea: Secondary | ICD-10-CM | POA: Diagnosis present

## 2015-11-14 DIAGNOSIS — Z95810 Presence of automatic (implantable) cardiac defibrillator: Secondary | ICD-10-CM

## 2015-11-14 DIAGNOSIS — Z87891 Personal history of nicotine dependence: Secondary | ICD-10-CM | POA: Diagnosis not present

## 2015-11-14 DIAGNOSIS — I427 Cardiomyopathy due to drug and external agent: Secondary | ICD-10-CM | POA: Diagnosis present

## 2015-11-14 DIAGNOSIS — Z923 Personal history of irradiation: Secondary | ICD-10-CM

## 2015-11-14 DIAGNOSIS — N179 Acute kidney failure, unspecified: Secondary | ICD-10-CM | POA: Diagnosis not present

## 2015-11-14 DIAGNOSIS — K219 Gastro-esophageal reflux disease without esophagitis: Secondary | ICD-10-CM | POA: Diagnosis present

## 2015-11-14 DIAGNOSIS — T451X5A Adverse effect of antineoplastic and immunosuppressive drugs, initial encounter: Secondary | ICD-10-CM | POA: Diagnosis present

## 2015-11-14 DIAGNOSIS — I2510 Atherosclerotic heart disease of native coronary artery without angina pectoris: Secondary | ICD-10-CM | POA: Diagnosis present

## 2015-11-14 DIAGNOSIS — I10 Essential (primary) hypertension: Secondary | ICD-10-CM | POA: Diagnosis present

## 2015-11-14 DIAGNOSIS — Z794 Long term (current) use of insulin: Secondary | ICD-10-CM | POA: Diagnosis not present

## 2015-11-14 DIAGNOSIS — E876 Hypokalemia: Secondary | ICD-10-CM | POA: Diagnosis not present

## 2015-11-14 DIAGNOSIS — K5790 Diverticulosis of intestine, part unspecified, without perforation or abscess without bleeding: Secondary | ICD-10-CM | POA: Diagnosis not present

## 2015-11-14 DIAGNOSIS — I130 Hypertensive heart and chronic kidney disease with heart failure and stage 1 through stage 4 chronic kidney disease, or unspecified chronic kidney disease: Secondary | ICD-10-CM | POA: Diagnosis present

## 2015-11-14 DIAGNOSIS — N189 Chronic kidney disease, unspecified: Secondary | ICD-10-CM | POA: Diagnosis not present

## 2015-11-14 DIAGNOSIS — F329 Major depressive disorder, single episode, unspecified: Secondary | ICD-10-CM | POA: Diagnosis present

## 2015-11-14 DIAGNOSIS — M1990 Unspecified osteoarthritis, unspecified site: Secondary | ICD-10-CM | POA: Diagnosis present

## 2015-11-14 DIAGNOSIS — G2581 Restless legs syndrome: Secondary | ICD-10-CM | POA: Diagnosis not present

## 2015-11-14 DIAGNOSIS — Z66 Do not resuscitate: Secondary | ICD-10-CM | POA: Diagnosis present

## 2015-11-14 DIAGNOSIS — E669 Obesity, unspecified: Secondary | ICD-10-CM | POA: Diagnosis present

## 2015-11-14 DIAGNOSIS — I480 Paroxysmal atrial fibrillation: Secondary | ICD-10-CM | POA: Diagnosis present

## 2015-11-14 DIAGNOSIS — E1149 Type 2 diabetes mellitus with other diabetic neurological complication: Secondary | ICD-10-CM | POA: Diagnosis present

## 2015-11-14 DIAGNOSIS — D500 Iron deficiency anemia secondary to blood loss (chronic): Secondary | ICD-10-CM | POA: Insufficient documentation

## 2015-11-14 DIAGNOSIS — R42 Dizziness and giddiness: Secondary | ICD-10-CM | POA: Diagnosis not present

## 2015-11-14 DIAGNOSIS — Z833 Family history of diabetes mellitus: Secondary | ICD-10-CM

## 2015-11-14 DIAGNOSIS — E039 Hypothyroidism, unspecified: Secondary | ICD-10-CM | POA: Diagnosis not present

## 2015-11-14 DIAGNOSIS — J449 Chronic obstructive pulmonary disease, unspecified: Secondary | ICD-10-CM | POA: Diagnosis present

## 2015-11-14 DIAGNOSIS — R404 Transient alteration of awareness: Secondary | ICD-10-CM | POA: Diagnosis not present

## 2015-11-14 DIAGNOSIS — G459 Transient cerebral ischemic attack, unspecified: Secondary | ICD-10-CM

## 2015-11-14 DIAGNOSIS — K317 Polyp of stomach and duodenum: Secondary | ICD-10-CM | POA: Diagnosis not present

## 2015-11-14 DIAGNOSIS — R195 Other fecal abnormalities: Secondary | ICD-10-CM | POA: Diagnosis not present

## 2015-11-14 DIAGNOSIS — I959 Hypotension, unspecified: Secondary | ICD-10-CM | POA: Diagnosis not present

## 2015-11-14 DIAGNOSIS — H538 Other visual disturbances: Secondary | ICD-10-CM | POA: Diagnosis not present

## 2015-11-14 DIAGNOSIS — F419 Anxiety disorder, unspecified: Secondary | ICD-10-CM | POA: Diagnosis present

## 2015-11-14 DIAGNOSIS — J439 Emphysema, unspecified: Secondary | ICD-10-CM | POA: Diagnosis not present

## 2015-11-14 DIAGNOSIS — E1122 Type 2 diabetes mellitus with diabetic chronic kidney disease: Secondary | ICD-10-CM | POA: Diagnosis present

## 2015-11-14 DIAGNOSIS — J9811 Atelectasis: Secondary | ICD-10-CM | POA: Diagnosis not present

## 2015-11-14 HISTORY — DX: Presence of automatic (implantable) cardiac defibrillator: Z95.810

## 2015-11-14 HISTORY — DX: Presence of cardiac pacemaker: Z95.0

## 2015-11-14 LAB — BRAIN NATRIURETIC PEPTIDE: B NATRIURETIC PEPTIDE 5: 216.4 pg/mL — AB (ref 0.0–100.0)

## 2015-11-14 LAB — BASIC METABOLIC PANEL
ANION GAP: 14 (ref 5–15)
BUN: 55 mg/dL — ABNORMAL HIGH (ref 6–20)
CO2: 28 mmol/L (ref 22–32)
Calcium: 10.7 mg/dL — ABNORMAL HIGH (ref 8.9–10.3)
Chloride: 92 mmol/L — ABNORMAL LOW (ref 101–111)
Creatinine, Ser: 1.58 mg/dL — ABNORMAL HIGH (ref 0.44–1.00)
GFR calc Af Amer: 40 mL/min — ABNORMAL LOW (ref >60)
GFR, EST NON AFRICAN AMERICAN: 35 mL/min — AB (ref >60)
Glucose, Bld: 152 mg/dL — ABNORMAL HIGH (ref 65–99)
POTASSIUM: 3.1 mmol/L — AB (ref 3.5–5.1)
SODIUM: 134 mmol/L — AB (ref 135–145)

## 2015-11-14 LAB — CBC WITH DIFFERENTIAL/PLATELET
BASOS ABS: 0.1 10*3/uL (ref 0.0–0.1)
Basophils Relative: 0 %
EOS ABS: 0.2 10*3/uL (ref 0.0–0.7)
Eosinophils Relative: 1 %
HCT: 25.5 % — ABNORMAL LOW (ref 36.0–46.0)
HEMOGLOBIN: 7.7 g/dL — AB (ref 12.0–15.0)
LYMPHS ABS: 2 10*3/uL (ref 0.7–4.0)
LYMPHS PCT: 16 %
MCH: 26.4 pg (ref 26.0–34.0)
MCHC: 30.2 g/dL (ref 30.0–36.0)
MCV: 87.3 fL (ref 78.0–100.0)
Monocytes Absolute: 1.1 10*3/uL — ABNORMAL HIGH (ref 0.1–1.0)
Monocytes Relative: 9 %
NEUTROS ABS: 8.8 10*3/uL — AB (ref 1.7–7.7)
NEUTROS PCT: 74 %
PLATELETS: 424 10*3/uL — AB (ref 150–400)
RBC: 2.92 MIL/uL — AB (ref 3.87–5.11)
RDW: 17.2 % — ABNORMAL HIGH (ref 11.5–15.5)
WBC: 12.1 10*3/uL — AB (ref 4.0–10.5)

## 2015-11-14 LAB — MRSA PCR SCREENING: MRSA BY PCR: NEGATIVE

## 2015-11-14 LAB — RETICULOCYTES
RBC.: 2.94 MIL/uL — AB (ref 3.87–5.11)
RETIC COUNT ABSOLUTE: 208.7 10*3/uL — AB (ref 19.0–186.0)
Retic Ct Pct: 7.1 % — ABNORMAL HIGH (ref 0.4–3.1)

## 2015-11-14 LAB — GLUCOSE, CAPILLARY
GLUCOSE-CAPILLARY: 207 mg/dL — AB (ref 65–99)
Glucose-Capillary: 288 mg/dL — ABNORMAL HIGH (ref 65–99)

## 2015-11-14 LAB — FERRITIN: Ferritin: 6 ng/mL — ABNORMAL LOW (ref 11–307)

## 2015-11-14 LAB — TROPONIN I
TROPONIN I: 0.03 ng/mL (ref <0.031)
Troponin I: <0.03 ng/mL (ref <0.031)

## 2015-11-14 LAB — IRON AND TIBC
Iron: 28 ug/dL (ref 28–170)
SATURATION RATIOS: 5 % — AB (ref 10.4–31.8)
TIBC: 560 ug/dL — ABNORMAL HIGH (ref 250–450)
UIBC: 532 ug/dL

## 2015-11-14 LAB — PROTIME-INR
INR: 2.16 — AB (ref 0.00–1.49)
PROTHROMBIN TIME: 23.9 s — AB (ref 11.6–15.2)

## 2015-11-14 LAB — FOLATE: Folate: 11.1 ng/mL (ref >5.9)

## 2015-11-14 LAB — VITAMIN B12: Vitamin B-12: 1035 pg/mL — ABNORMAL HIGH (ref 180–914)

## 2015-11-14 LAB — D-DIMER, QUANTITATIVE: D-Dimer, Quant: <0.27 ug/mL-FEU (ref 0.00–0.50)

## 2015-11-14 LAB — PREPARE RBC (CROSSMATCH)

## 2015-11-14 LAB — ABO/RH: ABO/RH(D): A POS

## 2015-11-14 LAB — POC OCCULT BLOOD, ED: Fecal Occult Bld: POSITIVE — AB

## 2015-11-14 MED ORDER — SODIUM CHLORIDE 0.9 % IV SOLN: 10.0000 mL/h | Freq: Once | INTRAVENOUS | Status: DC

## 2015-11-14 MED ORDER — PANTOPRAZOLE SODIUM 40 MG IV SOLR: 40.0000 mg | Freq: Two times a day (BID) | INTRAVENOUS | Status: DC

## 2015-11-14 MED ORDER — POTASSIUM CHLORIDE CRYS ER 20 MEQ PO TBCR
40.0000 meq | EXTENDED_RELEASE_TABLET | Freq: Once | ORAL | Status: AC
  Administered 2015-11-14: 40 meq via ORAL
  Filled 2015-11-14: qty 2

## 2015-11-14 MED ORDER — POTASSIUM CHLORIDE CRYS ER 20 MEQ PO TBCR
40.0000 meq | EXTENDED_RELEASE_TABLET | Freq: Two times a day (BID) | ORAL | Status: DC
  Administered 2015-11-15 – 2015-11-17 (×6): 40 meq via ORAL
  Filled 2015-11-14 (×6): qty 2

## 2015-11-14 MED ORDER — INSULIN ASPART 100 UNIT/ML ~~LOC~~ SOLN
0.0000 [IU] | Freq: Every day | SUBCUTANEOUS | Status: DC
  Administered 2015-11-14: 3 [IU] via SUBCUTANEOUS
  Administered 2015-11-15: 2 [IU] via SUBCUTANEOUS
  Administered 2015-11-16: 3 [IU] via SUBCUTANEOUS
  Administered 2015-11-17: 2 [IU] via SUBCUTANEOUS

## 2015-11-14 MED ORDER — IVABRADINE HCL 5 MG PO TABS
5.0000 mg | ORAL_TABLET | Freq: Two times a day (BID) | ORAL | Status: DC
Start: 2015-11-14 — End: 2015-11-18
  Administered 2015-11-14 – 2015-11-18 (×9): 5 mg via ORAL
  Filled 2015-11-14 (×12): qty 1

## 2015-11-14 MED ORDER — BUSPIRONE HCL 15 MG PO TABS
30.0000 mg | ORAL_TABLET | Freq: Two times a day (BID) | ORAL | Status: DC
  Administered 2015-11-14 – 2015-11-18 (×8): 30 mg via ORAL
  Filled 2015-11-14 (×9): qty 2

## 2015-11-14 MED ORDER — INSULIN ASPART 100 UNIT/ML ~~LOC~~ SOLN
0.0000 [IU] | Freq: Three times a day (TID) | SUBCUTANEOUS | Status: DC
  Administered 2015-11-15: 5 [IU] via SUBCUTANEOUS
  Administered 2015-11-15: 8 [IU] via SUBCUTANEOUS
  Administered 2015-11-16: 5 [IU] via SUBCUTANEOUS
  Administered 2015-11-16: 3 [IU] via SUBCUTANEOUS
  Administered 2015-11-16 – 2015-11-17 (×3): 5 [IU] via SUBCUTANEOUS
  Administered 2015-11-18: 3 [IU] via SUBCUTANEOUS
  Administered 2015-11-18: 8 [IU] via SUBCUTANEOUS
  Administered 2015-11-18: 5 [IU] via SUBCUTANEOUS

## 2015-11-14 MED ORDER — SODIUM CHLORIDE 0.9 % IV SOLN
8.0000 mg/h | INTRAVENOUS | Status: DC
  Administered 2015-11-14 – 2015-11-16 (×4): 8 mg/h via INTRAVENOUS
  Filled 2015-11-14 (×10): qty 80

## 2015-11-14 MED ORDER — CARVEDILOL 12.5 MG PO TABS
12.5000 mg | ORAL_TABLET | Freq: Two times a day (BID) | ORAL | Status: DC
  Administered 2015-11-14 – 2015-11-18 (×8): 12.5 mg via ORAL
  Filled 2015-11-14 (×8): qty 1

## 2015-11-14 MED ORDER — AMIODARONE HCL 200 MG PO TABS
200.0000 mg | ORAL_TABLET | Freq: Every day | ORAL | Status: DC
  Administered 2015-11-15 – 2015-11-18 (×4): 200 mg via ORAL
  Filled 2015-11-14 (×4): qty 1

## 2015-11-14 MED ORDER — HYDROXYZINE HCL 25 MG PO TABS
50.0000 mg | ORAL_TABLET | Freq: Four times a day (QID) | ORAL | Status: DC | PRN
  Administered 2015-11-15 – 2015-11-18 (×4): 50 mg via ORAL
  Filled 2015-11-14 (×5): qty 2

## 2015-11-14 MED ORDER — ATORVASTATIN CALCIUM 40 MG PO TABS
40.0000 mg | ORAL_TABLET | Freq: Every day | ORAL | Status: DC
  Administered 2015-11-14 – 2015-11-18 (×5): 40 mg via ORAL
  Filled 2015-11-14 (×5): qty 1

## 2015-11-14 MED ORDER — INSULIN GLARGINE 100 UNIT/ML ~~LOC~~ SOLN
56.0000 [IU] | Freq: Every day | SUBCUTANEOUS | Status: DC
  Administered 2015-11-14 – 2015-11-15 (×2): 56 [IU] via SUBCUTANEOUS
  Filled 2015-11-14 (×3): qty 0.56

## 2015-11-14 MED ORDER — SODIUM CHLORIDE 0.9% FLUSH
3.0000 mL | Freq: Two times a day (BID) | INTRAVENOUS | Status: DC
  Administered 2015-11-14 – 2015-11-18 (×6): 3 mL via INTRAVENOUS

## 2015-11-14 MED ORDER — TIOTROPIUM BROMIDE MONOHYDRATE 18 MCG IN CAPS
18.0000 ug | ORAL_CAPSULE | Freq: Every day | RESPIRATORY_TRACT | Status: DC
  Administered 2015-11-15 – 2015-11-17 (×3): 18 ug via RESPIRATORY_TRACT
  Filled 2015-11-14: qty 5

## 2015-11-14 MED ORDER — FUROSEMIDE 10 MG/ML IJ SOLN
20.0000 mg | Freq: Once | INTRAMUSCULAR | Status: AC
  Administered 2015-11-15: 20 mg via INTRAVENOUS
  Filled 2015-11-14: qty 2

## 2015-11-14 MED ORDER — ROPINIROLE HCL 1 MG PO TABS
1.0000 mg | ORAL_TABLET | Freq: Every day | ORAL | Status: DC
Start: 2015-11-14 — End: 2015-11-18
  Administered 2015-11-14 – 2015-11-17 (×4): 1 mg via ORAL
  Filled 2015-11-14 (×4): qty 1

## 2015-11-14 MED ORDER — DULOXETINE HCL 30 MG PO CPEP
30.0000 mg | ORAL_CAPSULE | Freq: Every day | ORAL | Status: DC
  Administered 2015-11-15 – 2015-11-18 (×4): 30 mg via ORAL
  Filled 2015-11-14 (×4): qty 1

## 2015-11-14 MED ORDER — LEVOTHYROXINE SODIUM 75 MCG PO TABS
75.0000 ug | ORAL_TABLET | Freq: Every day | ORAL | Status: DC
  Administered 2015-11-15 – 2015-11-18 (×4): 75 ug via ORAL
  Filled 2015-11-14 (×4): qty 1

## 2015-11-14 MED ORDER — PANTOPRAZOLE SODIUM 40 MG IV SOLR
80.0000 mg | Freq: Once | INTRAVENOUS | Status: AC
  Administered 2015-11-14: 80 mg via INTRAVENOUS
  Filled 2015-11-14: qty 80

## 2015-11-14 MED ORDER — DIGOXIN 125 MCG PO TABS
0.1250 mg | ORAL_TABLET | ORAL | Status: DC
  Administered 2015-11-16 – 2015-11-18 (×2): 0.125 mg via ORAL
  Filled 2015-11-14 (×2): qty 1

## 2015-11-14 NOTE — Consult Note (Signed)
[facility] Consult: 8:10 AM 11/15/2015  LOS: 1 day    Referring Provider: Dr Andria Frames  Primary Care Physician:  Nicoletta Dress, MD Primary Gastroenterologist:  In [place]    Reason for Consultation:  Anemia,    HPI: [name] is a 60 y.o. female.  Obesity.  Type 2 DM, on insulin.  OSA/CPAP.  CHF, chemotherapy related CM, EF 20%.  S/p ICD 2014.  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   Breast cancer 2005.  Breast lump on left, biopsy benign 07/2013 and1/23/17.  Diffusely fatty liver on CT in 2008.  Previous cholecystectomy.  Restless leg.  Gastroparesis. Hx of ulcer disease age 38, had EGD then.  No recurrent ulcers.  Remote anemia during child bearing years.  Polypharmacy.    2015 Colonoscopy in [place]: "small" "non-cancerous" polyps 1998 Colonoscopy. "normal" per archive.  No report located.    Started Eliquis 09/2015 for new onset afib.  No ASA due to allergy to this and to NSAIDs.   Pt in ED 4/11 with acute blurry vision.  3 weeks progressive dyspnea and 2 pillow orthopnea. Increased restless leg sxs.    Gives hx of streaking blood in stool she attributes to hemorrhoids.   "Black" stools for several months but constipated, BMs q 3 to 4 days despite 3 senna per day, uses mag citrate prn.  Takes PPI BID though does not have significant reflux disease.  No abdominal pain, no nausea, no anorexia except yesterday.  Weight fluctuates but stable overall.  No nose bleeds, bleeds/bruises readily but not excessively.  Never before transfused.  Not on iron at home.     hgb 7.7.  Was 11.6 to 12.7 in 08/2015.  MCV 87.  Again 7.7 post PRBC x 1.   FOBT +.  Coags elevated.  BUN 55, elevated >> than creatinine 1.5.  K low 3.1.   No CHF on xray.  BNP 216.  Head CT negative.    Fm Hx + for pancreatic and lung Ca in  mother. Also aunts with uterine, mat GM with breast cancer.      Past Medical History  Diagnosis Date  . 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 DYSLIPIDEMIA 05/01/2009  . OBESITY 07/24/2007  . OBSTRUCTIVE SLEEP APNEA 07/24/2007    with CPAP compliance  . RESTLESS LEG SYNDROME 07/24/2007  . SYSTOLIC HEART FAILURE, CHRONIC 01/05/2009    a. chemo induced cardiomyopathy b. cMRI (02/2009) EF 45% c. Myoview 05/2010: EF 59%, no ischemia d. RHC (01/2012) RA 20, RV 55/13/22, PA 56/34 (44), PCWP 28, Fick CO/CI: 3.1 / 1.5, PVR 5.3 WU, PA sat 42% & 48% e. ECHO (10/2012) EF 20-25%, diff HK, MV calcified annulus f. ECHO (03/2014) EF 30-35%, sev HK, inferior/inferoseptal walls, mild MR  . 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 Hypertension   . History of breast cancer 2006    with return in 2008, s/p mastectomy, XRT and chemotherapy  . Osteoarthritis   . Anxiety   . Depression     followed by a psychiatrist  . Asthma   . GERD (gastroesophageal reflux disease)   .  Gastroparesis   . IBS (irritable bowel syndrome)   . Myocardial infarction ([place])   . ICD (implantable cardiac defibrillator) in place 11/10/2012    ICD (11/2012)  . COPD (chronic obstructive pulmonary disease) ([place])   . Persistent atrial fibrillation ([place]) 09/2014    Past Surgical History  Procedure Laterality Date  . Mastectomy      right  . Abdominal hysterectomy  1991  . Cardiac defibrillator placement  11/10/2012    SJM Fortify Assura VR implanted by Dr Rayann Heman for primary prevention  . Cholecystectomy    . Wrist surgery Bilateral   . Knee cartilage surgery    . Bi-ventricular implantable cardioverter defibrillator N/A 11/10/2012    SJM Forify Assura single chamber ICD  . Cardiac catheterization N/A 08/21/2015    Procedure: Right/Left Heart Cath and Coronary Angiography;  Surgeon: Jolaine Artist, MD;  Location: [facility];  Service: Cardiovascular;  Laterality: N/A;    Prior to Admission medications   Medication Sig Start Date End Date Taking?  Authorizing Provider  acetaminophen (TYLENOL ARTHRITIS PAIN) 650 MG CR tablet Take 1,300 mg by mouth every 8 (eight) hours as needed. pain   Yes Historical Provider, MD  albuterol (PROVENTIL HFA) 108 (90 BASE) MCG/ACT inhaler Inhale 2 puffs into the lungs every 6 (six) hours as needed. For shortness of breath   Yes Historical Provider, MD  amiodarone (PACERONE) 200 MG tablet Take 1 tablet (200 mg total) by mouth daily. 09/05/15  Yes Amy D Clegg, NP  atorvastatin (LIPITOR) 40 MG tablet Take 40 mg by mouth daily.     Yes Historical Provider, MD  busPIRone (BUSPAR) 30 MG tablet Take 30 mg by mouth 2 (two) times daily.    Yes Historical Provider, MD  carvedilol (COREG) 12.5 MG tablet Take 1 tablet (12.5 mg total) by mouth 2 (two) times daily with a meal. 09/22/15  Yes Thompson Grayer, MD  digoxin (LANOXIN) 0.125 MG tablet Take 1 tablet (0.125 mg total) by mouth every other day. 06/28/15  Yes Jolaine Artist, MD  docusate sodium (COLACE) 100 MG capsule Take 3 capsules (300 mg total) by mouth at bedtime. 10/03/15  Yes Amy D Clegg, NP  DULoxetine (CYMBALTA) 30 MG capsule Take 30 mg by mouth daily.   Yes Amy Moon, NP  furosemide (LASIX) 20 MG tablet 40 mg twice a day Patient taking differently: Take 40 mg by mouth 2 (two) times daily. 40 mg twice a day 08/22/15  Yes Amy D Clegg, NP  Garlic 123XX123 MG CAPS Take 1 capsule by mouth daily. Reported on 08/19/2015   Yes Historical Provider, MD  Glucosamine 500 MG CAPS Take 1 capsule by mouth daily.     Yes Historical Provider, MD  hydrOXYzine (ATARAX/VISTARIL) 50 MG tablet Take 50 mg by mouth every 6 (six) hours as needed for itching.   Yes Historical Provider, MD  insulin lispro (HUMALOG) 100 UNIT/ML injection Inject 0-12 Units into the skin 3 (three) times daily before meals.   Yes Historical Provider, MD  insulin lispro protamine-lispro (HUMALOG 75/25 MIX) (75-25) 100 UNIT/ML SUSP injection Inject 60 Units into the skin 2 (two) times daily with a meal.   Yes Historical  Provider, MD  ivabradine (CORLANOR) 5 MG TABS tablet Take 1 tablet (5 mg total) by mouth 2 (two) times daily with a meal. 08/08/15  Yes Amy D Clegg, NP  levothyroxine (SYNTHROID, LEVOTHROID) 75 MCG tablet Take 75 mcg by mouth daily before breakfast.    Yes Historical Provider,  MD  metolazone (ZAROXOLYN) 2.5 MG tablet Take 2.5 mg by mouth as needed (for weight gain >3 lbs).   Yes Historical Provider, MD  mometasone (NASONEX) 50 MCG/ACT nasal spray Place 1 spray into both nostrils daily. allergies   Yes Historical Provider, MD  omega-3 acid ethyl esters (LOVAZA) 1 G capsule Take 2 g by mouth 2 (two) times daily.    Yes Historical Provider, MD  pantoprazole (PROTONIX) 40 MG tablet Take 40 mg by mouth 2 (two) times daily.     Yes Historical Provider, MD  potassium chloride SA (K-DUR,KLOR-CON) 20 MEQ tablet Take 2 tablets (40 mEq total) by mouth 2 (two) times daily. 09/05/15  Yes Amy D Clegg, NP  ramipril (ALTACE) 5 MG capsule Take 1 capsule (5 mg total) by mouth at bedtime. 08/22/15  Yes Amy D Clegg, NP  rivaroxaban (XARELTO) 20 MG TABS tablet Take 1 tablet (20 mg total) by mouth daily with supper. 09/22/15  Yes Thompson Grayer, MD  rOPINIRole (REQUIP) 1 MG tablet Take 1 mg by mouth at bedtime.     Yes Historical Provider, MD  spironolactone (ALDACTONE) 25 MG tablet Take 25 mg by mouth daily.   Yes Historical Provider, MD  tiotropium (SPIRIVA) 18 MCG inhalation capsule Place 18 mcg into inhaler and inhale daily.     Yes Historical Provider, MD  vitamin B-12 (CYANOCOBALAMIN) 1000 MCG tablet Take 1,000 mcg by mouth daily.   Yes Historical Provider, MD  vitamin C (ASCORBIC ACID) 500 MG tablet Take 500 mg by mouth daily.   Yes Historical Provider, MD  vitamin E 400 UNIT capsule Take 400 Units by mouth daily.   Yes Historical Provider, MD  Insulin Glargine (TOUJEO SOLOSTAR [place]) Inject 110 Units into the skin daily.     Historical Provider, MD  insulin lispro (HUMALOG) 100 UNIT/ML injection Inject 40 Units into the  skin 2 (two) times daily with a meal.     Historical Provider, MD  Insulin Pen Needle (B-D ULTRAFINE III SHORT PEN) 31G X 8 MM MISC 18/day as directed dx 250.00 08/01/11   Renato Shin, MD  nitroGLYCERIN (NITROSTAT) 0.4 MG SL tablet Place 1 tablet (0.4 mg total) under the tongue every 5 (five) minutes as needed. For chest pain 10/18/15   Jolaine Artist, MD  SUMAtriptan (IMITREX) 25 MG tablet Take 25 mg by mouth every 2 (two) hours as needed. For migraine    Historical Provider, MD    Scheduled Meds: . amiodarone  200 mg Oral Daily  . atorvastatin  40 mg Oral q1800  . busPIRone  30 mg Oral BID  . carvedilol  12.5 mg Oral BID WC  . [START ON 11/16/2015] digoxin  0.125 mg Oral QODAY  . DULoxetine  30 mg Oral Daily  . insulin aspart  0-15 Units Subcutaneous TID WC  . insulin aspart  0-5 Units Subcutaneous QHS  . insulin glargine  56 Units Subcutaneous QHS  . ivabradine  5 mg Oral BID WC  . levothyroxine  75 mcg Oral QAC breakfast  . potassium chloride SA  40 mEq Oral BID  . rOPINIRole  1 mg Oral QHS  . sodium chloride flush  3 mL Intravenous Q12H  . tiotropium  18 mcg Inhalation Daily   Infusions: . pantoprozole (PROTONIX) infusion 8 mg/hr (11/15/15 0400)   PRN Meds:    Allergies as of 11/14/2015 - Review Complete 11/14/2015  Allergen Reaction Noted  . Bee venom Anaphylaxis 03/16/2014  . Other Other (See Comments) 08/19/2015  .  Aspirin Hives   . Ciprofloxacin Hives   . Codeine Nausea Only   . Naproxen Hives   . Sulfonamide derivatives Hives   . Nsaids Itching and Rash 12/25/2014  . Tape Rash 08/11/2011    Family History  Problem Relation Age of Onset  . Diabetes Mellitus II Mother   . Coronary artery disease Father     CABG x 3  . Breast cancer Cousin   . Breast cancer Cousin   . Uterine cancer Cousin   . Lung cancer Mother   . Congestive Heart Failure Mother   . Hypertension Father   . Diabetes Mellitus II Father     Social History   Social History  .  Marital Status: Divorced    Spouse Name: N/A  . Number of Children: N/A  . Years of Education: N/A   Occupational History  . Disabled    Social History Main Topics  . Smoking status: Former Smoker    Quit date: 04/12/2001  . Smokeless tobacco: Never Used  . Alcohol Use: No  . Drug Use: No  . Sexual Activity: Not Currently   Other Topics Concern  . Not on file   Social History Narrative   Lives with mother and brother Philis Nettle          REVIEW OF SYSTEMS: Constitutional:  Per HPI ENT:  No nose bleeds Pulm:  No cough.  + DOE CV:  No palpitations, no LE edema.  GU:  No hematuria, no frequency GI:  Per HPI.  No dysphagia Heme:  Per HPI   Transfusions:  Per HPI Neuro:  No headaches, no peripheral tingling or numbness Derm:  No itching, no rash or sores.  Endocrine:  No sweats or chills.  No polyuria or dysuria Immunization:  Not queried. Travel:  None beyond local counties in last few months.    PHYSICAL EXAM: Vital signs in last 24 hours: Filed Vitals:   11/15/15 0728 11/15/15 0808  BP:  134/53  Pulse: 89 88  Temp: 97.3 F (36.3 C)   Resp:     Wt Readings from Last 3 Encounters:  11/15/15 100 kg (220 lb 7.4 oz)  10/24/15 102.241 kg (225 lb 6.4 oz)  10/03/15 100.608 kg (221 lb 12.8 oz)    General: morbidly obese, pleasant, looks old for age.  Comfortable.  Head:  No asymmetry, no edema  Eyes:  No icterus, + conj pallor Ears:  Not HOH  Nose:  No congestion or discharge Mouth:  Clear and moist. Only lower incisors remain Neck:  No mass, no JVD Lungs:  Clear bil.  No labored breathing or cough Heart: RRR.  No mrg.  S1/S2 audible Abdomen:  Obese not tender.  bil upper abdominal hernias.  BS active, no HSM, no bruits.   Rectal: deferred.  Stool dark, FOBT + in ED   Musc/Skeltl: no joint redness or swelling Extremities:  No CCE  Neurologic:  Oriented x 3, extremely detailed historian with chartings of meds.  Skin:  No telangectasia or sores.  No  rash Tattoos:  none   Psych:  Pleasant, calm.   Intake/Output from previous day: 04/11 0701 - 04/12 0700 In: 1092.1 [P.O.:440; I.V.:317.1; Blood:335] Out: 1500 [Urine:1500] Intake/Output this shift: Total I/O In: -  Out: 450 [Urine:450]  LAB RESULTS:  Recent Labs  11/14/15 1240 11/15/15 0213  WBC 12.1* 11.3*  HGB 7.7* 7.7*  HCT 25.5* 24.8*  PLT 424* 379   BMET Lab Results  Component Value Date  NA 131* 11/15/2015   NA 134* 11/14/2015   NA 138 10/24/2015   K 3.5 11/15/2015   K 3.1* 11/14/2015   K 3.6 10/24/2015   CL 92* 11/15/2015   CL 92* 11/14/2015   CL 96* 10/24/2015   CO2 28 11/15/2015   CO2 28 11/14/2015   CO2 31 10/24/2015   GLUCOSE 233* 11/15/2015   GLUCOSE 152* 11/14/2015   GLUCOSE 180* 10/24/2015   BUN 51* 11/15/2015   BUN 55* 11/14/2015   BUN 16 10/24/2015   CREATININE 1.55* 11/15/2015   CREATININE 1.58* 11/14/2015   CREATININE 0.98 10/24/2015   CALCIUM 9.9 11/15/2015   CALCIUM 10.7* 11/14/2015   CALCIUM 9.7 10/24/2015   LFT  Recent Labs  11/15/15 0213  PROT 6.7  ALBUMIN 3.1*  AST 24  ALT 20  ALKPHOS 56  BILITOT 2.5*   PT/INR Lab Results  Component Value Date   INR 1.73* 11/15/2015   INR 2.16* 11/14/2015   INR 1.16 08/19/2015   Hepatitis Panel No results for input(s): HEPBSAG, HCVAB, HEPAIGM, HEPBIGM in the last 72 hours. C-Diff No components found for: CDIFF Lipase     Component Value Date/Time   LIPASE 22 05/21/2011 1940    Drugs of Abuse  No results found for: LABOPIA, COCAINSCRNUR, LABBENZ, AMPHETMU, THCU, LABBARB   RADIOLOGY STUDIES: Dg Chest 2 View  11/14/2015  CLINICAL DATA:  Weakness and dizziness for 3 days, history CHF, hypertension, atrial fibrillation, COPD, asthma, former smoker EXAM: CHEST  2 VIEW COMPARISON:  Prior exams are not available at this time FINDINGS: LEFT subclavian transvenous AICD lead projects at RIGHT ventricle. Enlargement of cardiac silhouette. Mediastinal contours and pulmonary vascularity  normal. Atherosclerotic calcification aorta. Emphysematous changes with mild RIGHT basilar atelectasis. No gross failure or consolidation. No pleural effusion or pneumothorax. Bones demineralized. IMPRESSION: Enlargement of cardiac silhouette post AICD. Emphysematous changes with subsegmental atelectasis at RIGHT base. Electronically Signed   By: Lavonia Dana M.D.   On: 11/14/2015 13:39   Ct Head Wo Contrast  11/14/2015  CLINICAL DATA:  Generalized weakness and blurry vision EXAM: CT HEAD WITHOUT CONTRAST TECHNIQUE: Contiguous axial images were obtained from the base of the skull through the vertex without intravenous contrast. COMPARISON:  None. FINDINGS: There is no evidence of mass effect, midline shift or extra-axial fluid collections. There is no evidence of a space-occupying lesion or intracranial hemorrhage. There is no evidence of a cortical-based area of acute infarction. The ventricles and sulci are appropriate for the patient's age. The basal cisterns are patent. Visualized portions of the orbits are unremarkable. The visualized portions of the paranasal sinuses and mastoid air cells are unremarkable. The osseous structures are unremarkable. IMPRESSION: No acute intracranial pathology. Electronically Signed   By: Kathreen Devoid   On: 11/14/2015 13:57    ENDOSCOPIC STUDIES: Per HPI  IMPRESSION:   *  Anemia arising since pt started Xarelto 09/2015.  Black stools FOBT + PRBC x 1.  The repeat CBC was obtained right after transfusion completed, hopefully next Hgb will reflect improvement.  Doubt PUD as on PPI BID, no ASA/NSAIDs.  ? AVMs/neoplasia/portal htn related bleeding?    *  Afib.  09/2015 started Xarelto, now on hold: last dose 4/9.  coags are elevated, new since 08/2015.   *  CHF, EF 20%.  S/p ICD.  She is not currently having CHF flare clinically or by xray. .    *  OSA, on CPAP.   *  Fatty liver per CT of 2008.  Possibility of her having advanced to cirrhosis but LFTs are normal.     *  CKD, AKI.  GFR 35.        PLAN:     *  egd tomorrow 1415.  Eat today. Pt informed of plans and is eager to proceed.  Note MDs not wanting to rush into another PRBC transfusion for fear of exacerbating CHF, so I will check Hgb this afternoon.  CBC in AM.      Azucena Freed  11/15/2015, 8:10 AM Pager: [phone]

## 2015-11-14 NOTE — ED Notes (Signed)
Attempted report 

## 2015-11-14 NOTE — Consult Note (Signed)
CARDIOLOGY CONSULT NOTE   Patient ID: [name] MRN: [id number] DOB/AGE: [birth year] 61 y.o.  Admit date: 11/14/2015  Primary Physician   Nicoletta Dress, MD Primary Cardiologist   Dr Haroldine Laws EP: Dr. Rayann Heman Reason for Consultation  SOB and afib Requesting Physician  Dr.  Andria Frames  HPI: [name] is a 60 y.o. female with a history of breast cancer, obesity, HTN, DM, IBS, HLD, OSA, chemo induced cardiomyopathy s/p ICD, NSVT, chronic systolic HF and recent diagnosis of PAF on Xarelto who presented to ED with acute onset of blurred vision found to have hemoglobin of 7.7.  Admitted 08/19/15 with chest pain. Had [facility] with mild CAD and increased filling pressures. CPX placed on hold due to breast mass. Negative breast biopsy January 2017. H/o tachy palpitations, with a sense of anxiety.She has previously been on amiodarone for NSVT. diagnosed with new onset afib during clinic visit by Dr. Rayann Heman 2/17 and placed on Xarelto. She was referred to research department for genetic AF, but not meet criteria for study. She was in sinus rhythm when seen in afib clinic 10/24/15--> device function was normal. Seen by PCP about 2 weeks ago due to itching and her insuline was adjusted.   She was on her way to CHF clinic visit today and while driving she had blurred vision. She pulled over on parking and EKG was called. She did have one episode of severe sharp chest pain parking lot, last for few seconds. No associated symptoms. She has noted worsening SOB for the past few weeks. Dark stool and dizziness. No orthopnea, PND or syncope. No LE edema, headache, weakness of one side of body or slurred speech. Weighs herself every morning and adjust her diuretics as needed.  No EKG done today (not in system). BNP of 216.4. Hgb of 7.7. K of 3.1. Heme occult positive.   Past Medical History  Diagnosis Date  . 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 DYSLIPIDEMIA 05/01/2009  . OBESITY 07/24/2007  . OBSTRUCTIVE SLEEP APNEA  07/24/2007    with CPAP compliance  . RESTLESS LEG SYNDROME 07/24/2007  . SYSTOLIC HEART FAILURE, CHRONIC 01/05/2009    a. chemo induced cardiomyopathy b. cMRI (02/2009) EF 45% c. Myoview 05/2010: EF 59%, no ischemia d. RHC (01/2012) RA 20, RV 55/13/22, PA 56/34 (44), PCWP 28, Fick CO/CI: 3.1 / 1.5, PVR 5.3 WU, PA sat 42% & 48% e. ECHO (10/2012) EF 20-25%, diff HK, MV calcified annulus f. ECHO (03/2014) EF 30-35%, sev HK, inferior/inferoseptal walls, mild MR  . 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 Hypertension   . History of breast cancer 2006    with return in 2008, s/p mastectomy, XRT and chemotherapy  . Osteoarthritis   . Anxiety   . Depression     followed by a psychiatrist  . Asthma   . GERD (gastroesophageal reflux disease)   . Gastroparesis   . IBS (irritable bowel syndrome)   . Myocardial infarction ([place])   . ICD (implantable cardiac defibrillator) in place 11/10/2012    ICD (11/2012)  . COPD (chronic obstructive pulmonary disease) ([place])   . Persistent atrial fibrillation ([place]) 09/2014     Past Surgical History  Procedure Laterality Date  . Mastectomy      right  . Abdominal hysterectomy  1991  . Cardiac defibrillator placement  11/10/2012    SJM Fortify Assura VR implanted by Dr Rayann Heman for primary prevention  . Cholecystectomy    . Wrist surgery Bilateral   . Knee cartilage surgery    .  Bi-ventricular implantable cardioverter defibrillator N/A 11/10/2012    SJM Forify Assura single chamber ICD  . Cardiac catheterization N/A 08/21/2015    Procedure: Right/Left Heart Cath and Coronary Angiography;  Surgeon: Jolaine Artist, MD;  Location: [facility];  Service: Cardiovascular;  Laterality: N/A;    Allergies  Allergen Reactions  . Bee Venom Anaphylaxis  . Other Other (See Comments)    ANY TYPES OF METAL-CAUSES BLISTERS SKIN AND ITCHING   . Aspirin Hives  . Ciprofloxacin Hives  . Codeine Nausea Only    "can take ONLY if she eats with med"  . Naproxen Hives  .  Sulfonamide Derivatives Hives  . Nsaids Itching and Rash  . Tape Rash    Also allergic to metal    I have reviewed the patient's current medications   . sodium chloride    . pantoprazole (PROTONIX) IV    . pantoprozole (PROTONIX) infusion       Prior to Admission medications   Medication Sig Start Date End Date Taking? Authorizing Provider  acetaminophen (TYLENOL ARTHRITIS PAIN) 650 MG CR tablet Take 1,300 mg by mouth every 8 (eight) hours as needed. pain   Yes Historical Provider, MD  albuterol (PROVENTIL HFA) 108 (90 BASE) MCG/ACT inhaler Inhale 2 puffs into the lungs every 6 (six) hours as needed. For shortness of breath   Yes Historical Provider, MD  amiodarone (PACERONE) 200 MG tablet Take 1 tablet (200 mg total) by mouth daily. 09/05/15  Yes Amy D Clegg, NP  atorvastatin (LIPITOR) 40 MG tablet Take 40 mg by mouth daily.     Yes Historical Provider, MD  busPIRone (BUSPAR) 30 MG tablet Take 30 mg by mouth 2 (two) times daily.    Yes Historical Provider, MD  carvedilol (COREG) 12.5 MG tablet Take 1 tablet (12.5 mg total) by mouth 2 (two) times daily with a meal. 09/22/15  Yes Thompson Grayer, MD  digoxin (LANOXIN) 0.125 MG tablet Take 1 tablet (0.125 mg total) by mouth every other day. 06/28/15  Yes Jolaine Artist, MD  docusate sodium (COLACE) 100 MG capsule Take 3 capsules (300 mg total) by mouth at bedtime. 10/03/15  Yes Amy D Clegg, NP  DULoxetine (CYMBALTA) 30 MG capsule Take 30 mg by mouth daily.   Yes Amy Moon, NP  furosemide (LASIX) 20 MG tablet 40 mg twice a day Patient taking differently: Take 40 mg by mouth 2 (two) times daily. 40 mg twice a day 08/22/15  Yes Amy D Clegg, NP  Garlic 123XX123 MG CAPS Take 1 capsule by mouth daily. Reported on 08/19/2015   Yes Historical Provider, MD  Glucosamine 500 MG CAPS Take 1 capsule by mouth daily.     Yes Historical Provider, MD  hydrOXYzine (ATARAX/VISTARIL) 50 MG tablet Take 50 mg by mouth every 6 (six) hours as needed for itching.   Yes  Historical Provider, MD  insulin lispro (HUMALOG) 100 UNIT/ML injection Inject 0-12 Units into the skin 3 (three) times daily before meals.   Yes Historical Provider, MD  insulin lispro protamine-lispro (HUMALOG 75/25 MIX) (75-25) 100 UNIT/ML SUSP injection Inject 60 Units into the skin 2 (two) times daily with a meal.   Yes Historical Provider, MD  ivabradine (CORLANOR) 5 MG TABS tablet Take 1 tablet (5 mg total) by mouth 2 (two) times daily with a meal. 08/08/15  Yes Amy D Clegg, NP  levothyroxine (SYNTHROID, LEVOTHROID) 75 MCG tablet Take 75 mcg by mouth daily before breakfast.    Yes Historical  Provider, MD  metolazone (ZAROXOLYN) 2.5 MG tablet Take 2.5 mg by mouth as needed (for weight gain >3 lbs).   Yes Historical Provider, MD  mometasone (NASONEX) 50 MCG/ACT nasal spray Place 1 spray into both nostrils daily. allergies   Yes Historical Provider, MD  omega-3 acid ethyl esters (LOVAZA) 1 G capsule Take 2 g by mouth 2 (two) times daily.    Yes Historical Provider, MD  pantoprazole (PROTONIX) 40 MG tablet Take 40 mg by mouth 2 (two) times daily.     Yes Historical Provider, MD  potassium chloride SA (K-DUR,KLOR-CON) 20 MEQ tablet Take 2 tablets (40 mEq total) by mouth 2 (two) times daily. 09/05/15  Yes Amy D Clegg, NP  ramipril (ALTACE) 5 MG capsule Take 1 capsule (5 mg total) by mouth at bedtime. 08/22/15  Yes Amy D Clegg, NP  rivaroxaban (XARELTO) 20 MG TABS tablet Take 1 tablet (20 mg total) by mouth daily with supper. 09/22/15  Yes Thompson Grayer, MD  rOPINIRole (REQUIP) 1 MG tablet Take 1 mg by mouth at bedtime.     Yes Historical Provider, MD  spironolactone (ALDACTONE) 25 MG tablet Take 25 mg by mouth daily.   Yes Historical Provider, MD  tiotropium (SPIRIVA) 18 MCG inhalation capsule Place 18 mcg into inhaler and inhale daily.     Yes Historical Provider, MD  vitamin B-12 (CYANOCOBALAMIN) 1000 MCG tablet Take 1,000 mcg by mouth daily.   Yes Historical Provider, MD  vitamin C (ASCORBIC ACID) 500  MG tablet Take 500 mg by mouth daily.   Yes Historical Provider, MD  vitamin E 400 UNIT capsule Take 400 Units by mouth daily.   Yes Historical Provider, MD  Insulin Glargine (TOUJEO SOLOSTAR [place]) Inject 110 Units into the skin daily.     Historical Provider, MD  insulin lispro (HUMALOG) 100 UNIT/ML injection Inject 40 Units into the skin 2 (two) times daily with a meal.     Historical Provider, MD  Insulin Pen Needle (B-D ULTRAFINE III SHORT PEN) 31G X 8 MM MISC 18/day as directed dx 250.00 08/01/11   Renato Shin, MD  nitroGLYCERIN (NITROSTAT) 0.4 MG SL tablet Place 1 tablet (0.4 mg total) under the tongue every 5 (five) minutes as needed. For chest pain 10/18/15   Jolaine Artist, MD  SUMAtriptan (IMITREX) 25 MG tablet Take 25 mg by mouth every 2 (two) hours as needed. For migraine    Historical Provider, MD     Social History   Social History  . Marital Status: Divorced    Spouse Name: N/A  . Number of Children: N/A  . Years of Education: N/A   Occupational History  . Disabled    Social History Main Topics  . Smoking status: Former Smoker    Quit date: 04/12/2001  . Smokeless tobacco: Never Used  . Alcohol Use: No  . Drug Use: No  . Sexual Activity: Not Currently   Other Topics Concern  . Not on file   Social History Narrative   Lives with mother and brother Philis Nettle          Family Status  Relation Status Death Age  . Mother Deceased   . Father Deceased 59   Family History  Problem Relation Age of Onset  . Diabetes Mellitus II Mother   . Coronary artery disease Father     CABG x 3  . Breast cancer Cousin   . Breast cancer Cousin   . Uterine cancer Cousin   . Lung cancer  Mother   . Congestive Heart Failure Mother   . Hypertension Father   . Diabetes Mellitus II Father        ROS:  Full 14 point review of systems complete and found to be negative unless listed above.  Physical Exam: Blood pressure 122/60, pulse 81, temperature 97.8 F (36.6 C),  temperature source Oral, resp. rate 13, SpO2 100 %.  General: Well developed, well nourished, female in no acute distress Head: Eyes PERRLA, No xanthomas. Normocephalic and atraumatic, oropharynx without edema or exudate.  Lungs: Resp regular and unlabored, CTA. Heart: RRR no s3, s4, or murmurs..   Neck: No carotid bruits. No lymphadenopathy.  No JVD. Abdomen: Bowel sounds present, abdomen soft and non-tender without masses or hernias noted. Msk:  No spine or cva tenderness. No weakness, no joint deformities or effusions. Extremities: No clubbing, cyanosis or edema. DP/PT/Radials 2+ and equal bilaterally. Neuro: Alert and oriented X 3. No focal deficits noted. Psych:  Good affect, responds appropriately Skin: No rashes or lesions noted.  Labs:   Lab Results  Component Value Date   WBC 12.1* 11/14/2015   HGB 7.7* 11/14/2015   HCT 25.5* 11/14/2015   MCV 87.3 11/14/2015   PLT 424* 11/14/2015    Recent Labs  11/14/15 1240  INR 2.16*    Recent Labs Lab 11/14/15 1240  NA 134*  K 3.1*  CL 92*  CO2 28  BUN 55*  CREATININE 1.58*  CALCIUM 10.7*  GLUCOSE 152*   MAGNESIUM  Date Value Ref Range Status  10/03/2015 1.6* 1.7 - 2.4 mg/dL Final    Recent Labs  11/14/15 1240  TROPONINI 0.03   No results for input(s): TROPIPOC in the last 72 hours.   Echo: 08/21/2015 LV EF: 30% - 35%  ------------------------------------------------------------------- Indications: CHF - 428.0.  ------------------------------------------------------------------- History: PMH: GERD. Palpitations. PMH: Right Breast Cancer. Myocardial infarction. Myocardial infarction. Risk factors: Hypertension. Diabetes mellitus. Dyslipidemia.  ------------------------------------------------------------------- Study Conclusions  - Left ventricle: The cavity size was moderately dilated. Wall  thickness was normal. Systolic function was moderately to  severely reduced. The estimated  ejection fraction was in the  range of 30% to 35%. Diffuse hypokinesis. Doppler parameters are  consistent with restrictive physiology, indicative of decreased  left ventricular diastolic compliance and/or increased left  atrial pressure. Doppler parameters are consistent with high  ventricular filling pressure. - Mitral valve: Calcified annulus. Mildly thickened leaflets .  There was mild regurgitation.  Echo 08/21/15 LV EF: 30% - 35%  ------------------------------------------------------------------- Indications: CHF - 428.0.  ------------------------------------------------------------------- History: PMH: GERD. Palpitations. PMH: Right Breast Cancer. Myocardial infarction. Myocardial infarction. Risk factors: Hypertension. Diabetes mellitus. Dyslipidemia.  ------------------------------------------------------------------- Study Conclusions  - Left ventricle: The cavity size was moderately dilated. Wall  thickness was normal. Systolic function was moderately to  severely reduced. The estimated ejection fraction was in the  range of 30% to 35%. Diffuse hypokinesis. Doppler parameters are  consistent with restrictive physiology, indicative of decreased  left ventricular diastolic compliance and/or increased left  atrial pressure. Doppler parameters are consistent with high  ventricular filling pressure. - Mitral valve: Calcified annulus. Mildly thickened leaflets .  There was mild regurgitation.  ECG:  No EKG done today. Will get one  Radiology:  Dg Chest 2 View  11/14/2015  CLINICAL DATA:  Weakness and dizziness for 3 days, history CHF, hypertension, atrial fibrillation, COPD, asthma, former smoker EXAM: CHEST  2 VIEW COMPARISON:  Prior exams are not available at this time FINDINGS: LEFT subclavian transvenous AICD lead projects  at RIGHT ventricle. Enlargement of cardiac silhouette. Mediastinal contours and pulmonary vascularity normal.  Atherosclerotic calcification aorta. Emphysematous changes with mild RIGHT basilar atelectasis. No gross failure or consolidation. No pleural effusion or pneumothorax. Bones demineralized. IMPRESSION: Enlargement of cardiac silhouette post AICD. Emphysematous changes with subsegmental atelectasis at RIGHT base. Electronically Signed   By: Lavonia Dana M.D.   On: 11/14/2015 13:39   Ct Head Wo Contrast  11/14/2015  CLINICAL DATA:  Generalized weakness and blurry vision EXAM: CT HEAD WITHOUT CONTRAST TECHNIQUE: Contiguous axial images were obtained from the base of the skull through the vertex without intravenous contrast. COMPARISON:  None. FINDINGS: There is no evidence of mass effect, midline shift or extra-axial fluid collections. There is no evidence of a space-occupying lesion or intracranial hemorrhage. There is no evidence of a cortical-based area of acute infarction. The ventricles and sulci are appropriate for the patient's age. The basal cisterns are patent. Visualized portions of the orbits are unremarkable. The visualized portions of the paranasal sinuses and mastoid air cells are unremarkable. The osseous structures are unremarkable. IMPRESSION: No acute intracranial pathology. Electronically Signed   By: Kathreen Devoid   On: 11/14/2015 13:57    ASSESSMENT AND PLAN:     1.  Acute upper GI bleeding - Hgb of 7.7. K of 3.1. Heme occult positive. Hold xarelto. Will need GI clearance prior to restart. Replete electrolytes.   2. PAF - Sinus rhythm on exam. Pending EKG. CHADSVASCS score of 3. anticoagulation on hold due to acute blood loss.   3. Chronic systolic HF -  NICM thought to be chemo induced, s/p ST Jude ICD. Most Recent ECHO 08/21/15 EF 30-35%.  Had [facility] with mild CAD and increased filling pressures. - Appears euvolemic. Continue home regimen.      SignedLeanor Kail, [place] 11/14/2015, 4:38 PM Pager [phone]  Co-Sign MD

## 2015-11-14 NOTE — H&P (Signed)
[facility] Admission History and Physical Service Pager: [phone]  Patient name: [name] Medical record number: [id number] Date of birth: January 13, 1956 Age: 60 y.o. Gender: female  Primary Care Provider: Nicoletta Dress, MD Consultants: GI and Cardiology Code Status: DNR - per discussion on admission  Chief Complaint: blurry vision  Assessment and Plan: [name] is a 60 y.o. female presenting with sudden blurry vision and gradually worsening shortness of breath  . 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 Patient with melena and hematochezia (blood streak on stool). She was started on Xarelto for new onset Afib about a month ago, which likely exacerbated a more chronic issue. Hgb down from 11.6 to 7.7 over the last three months. BUN elevated to 55 suggestive for UGIB. Also reports worsening restless leg syndrome. Doubt CHF exacerbation without sign of fluid overload. However, she reports two-pillow orthopnea over the last three weeks. CXR without pulmonary edema. BNP 218 slightly elevated from baseline. No chest pain to suspect ACS. Troponin negative once. But patient with complex cardiac history. Doubt PE or pneumonia without tachypnea, tachycardia, chest pain or fever. CXR and D dimer negative as well.  -Admit to SDU. FPTS, Attending Dr. Andria Frames -Anemia panel -Type and cross -Transfuse one unit pRBC now  - Post transfusion H&H - Give 20 mg lasix IV after transfusion - GI consult - appreciate recs  -NPO after midnight - Cardiac monitoring - CBC in the morning - given brief episode of chest pain (though atypical  and unlikely cardiac), cycle troponins and repeat EKG in AM  AFib: recent diagnosis. She is in NSR now. CHAD-Vasc score 3. On amiodarone and Xarelto at home -Hold Xarelto in the setting of bleeding -Continue Amiodrone, BB, and digoxin -Card c/s - appreciate recs -EKG in the morning  HFrEF with LVEF 38% by nm scan. Chemo induced cardiomyopathy s/p St. Jude's ICD in 2014. On coreg, digoxin, Ivabradine, spironolactone and ramipril, lasix and metolazone. No sign of fluid overload now. CXR negative for pulmonary edema. However, with two-pillow orthopnea. Last Echo 08/2015 with EF 30-35%, diffuse hypokinesis  -Hold the spironolactone, metalozone and ACEi in the setting of AKI and tenuous BP with GIB -Continue other home meds as BP tolerates - daily weights and strict Is&Os - monitor fluid status carefully and reassess diuretics in AM  Hypertension: she is normotensive now, with some low BPs on arrival -continue meds as above  AKI: Cr 1.58 on admission. 0.98 about three weeks ago. Baseline appears to about 0.9. Likely prerenal in setting of volume contraction from anemia 2/2 GIB. -Hold nephrotoxic meds -encourage by mouth hydration - hold IVF in setting of HFrEF - pRBCs as above -BMP in the morning - consider renal US and urine studies if Cr not improving  Hypokalemia: K 3.1 -Replete with KDur 62mEq x1 now - Repeat BMET in AM  DM-2: on Toujeo 110 units and Humalog 40 units twice a day with meals. Last A1c 8.4 in 08/2015 -Continue Toujeo at 70 units daily -moderate SSI -repeat A1c  Hyperlipidemia: on atorvastatin -Continue home atorvastatin  OSA: on CPAP at home -Will continue CPAP qhs here  Restless leg syndrome: -continue home Ropinirole  Hypothyroidism:  TSH 3.8 in 10/2015. On synthroid 75 mcg -continue home synthroid  Anxiety: stable. On buspirone at home -Continue home buspirone  FEN/GI:  -Card modified and heart health diet -NPO after midnight - SLIV  Prophylaxis: SCD  in the setting of GI bleed  Disposition: SDU for treatment of anemia in the setting of GI bleed and complex cardiac history   History of Present Illness:  [name] is a 60 y.o. female presenting with acute blurry vision and gradually worsening shortness of breath.   About 9:20am, she was on her way to her doctor's office for routine follow up when she had blurry vision suddenly. She thought it is risky to drive and pulled to side of the road and activated her medical device to call  911. She denies any unusual symptoms such as focal weakness, slurred speech, numbness or tingling. After the EMS arrived, she reports brief sharp midsternal pain that lasted a second and she didn't even think about her nitro. She has gradually worsening shortness of breath for the last three weeks. Shortness of breath mostly with walking but could happen with sitting. Tired for long time, dizziness for 3 weeks but steadly worse. Spinning and then black out. Restless leg syndrome for 10 years. Gotten worse about three weeks ago.  She reports two pillow orthopnea for the last three weeks as well. Denies edema or weight gain. She checks her weight daily and adjust her diuretic dose accordingly. Denies oxygen use at home. Weighs herself every morning and adjust her diuretics as needed.  She reports hemorrhoidal bleeding, streaks of blood on her stool. Stools are also black for about three months. She says she had colonoscopy in [place] in 2015. States some polyps but no malignancy. She was started on Xarelto for new onset Afib in 10/2015. Sees Heart failure clinic.  Reports IBS. Still have constipation. Sometimes go two weeks without BM. Last BM the day before yesterday. Used Mg-citrate on Sunday. No epigastric pain except the hernia site bilaterally.  Breast cancer surviver: 2005. Lumpectomy and chemo in 2006, radiation in 2008.   Diet: reports eating variety including green vegetable & animal proteins (chicken,  fish and beef)  She has strong family history of cancer: Mother with lung and pancreatic cancer. Four maternal aunts with uterine cancer and grandmother with breast cancer.  Denies cough, fever, chills, dysuria or hematuria. ED course: vital signs significant for BP of 93/51 on arrival, CBC with Hgb of 7.7, WBC of 12. BNP 218. Trop neg. CXR no pneumonia or edema. Na 134, K 3.1, BUN 55, Cr 1.58. CT head negative  Review Of Systems: Per HPI Otherwise the remainder of the systems were negative.  Patient Active Problem List   Diagnosis Date Noted  . New onset atrial fibrillation ([place]) 09/22/2015  . Chest pain at rest 08/19/2015  . Chest pain 08/19/2015  . NSVT (nonsustained ventricular tachycardia) ([place]) 06/30/2015  . Breast cancer of lower-outer quadrant of right female breast ([place]) 07/22/2014  . CAD (coronary artery disease) 02/17/2014  . Type I (juvenile type) diabetes mellitus with neurological manifestations, not stated as uncontrolled 01/06/2013  . Cardiogenic shock ([place]) 01/31/2012  . Acute-on-chronic respiratory failure ([place]) 01/31/2012  . Hypotension 11/04/2011  . Acute on chronic systolic heart failure ([place]) 08/13/2011  . PALPITATIONS 09/20/2010  . Mixed hypercholesterolemia and hypertriglyceridemia 05/01/2009  . HYPERTENSION, BENIGN 01/05/2009  . SYSTOLIC HEART FAILURE, CHRONIC 01/05/2009  . FOOT PAIN 11/29/2008  . Sinus tachycardia ([place]) 10/25/2008  . 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 Obesity 07/24/2007  . Obstructive sleep apnea 07/24/2007  . RESTLESS LEG SYNDROME 07/24/2007    Past Medical History: Past Medical History  Diagnosis Date  . 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 DYSLIPIDEMIA 05/01/2009  . OBESITY 07/24/2007  . OBSTRUCTIVE SLEEP APNEA 07/24/2007    with CPAP compliance  . RESTLESS LEG SYNDROME 07/24/2007  . SYSTOLIC HEART FAILURE, CHRONIC 01/05/2009    a. chemo induced cardiomyopathy b. cMRI (02/2009) EF 45% c. Myoview 05/2010: EF 59%, no ischemia  d. RHC (01/2012) RA 20, RV 55/13/22, PA 56/34 (44), PCWP 28, Fick CO/CI: 3.1 / 1.5, PVR 5.3 WU, PA sat 42% & 48% e. ECHO (10/2012) EF 20-25%, diff HK, MV calcified annulus f. ECHO (03/2014) EF 30-35%, sev HK, inferior/inferoseptal walls, mild MR  . 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 Hypertension   . History of breast cancer 2006    with return in 2008, s/p mastectomy, XRT and chemotherapy  . Osteoarthritis   . Anxiety   . Depression     followed by a psychiatrist  . Asthma   . GERD (gastroesophageal reflux disease)   . Gastroparesis   . IBS (irritable bowel syndrome)   . Myocardial infarction ([place])   . ICD (implantable cardiac defibrillator) in place 11/10/2012    ICD (11/2012)  . COPD (chronic obstructive pulmonary disease) ([place])   . Persistent atrial fibrillation ([place]) 09/2014    Past Surgical History: Past Surgical History  Procedure Laterality Date  . Mastectomy      right  . Abdominal hysterectomy  1991  . Cardiac defibrillator placement  11/10/2012    SJM Fortify Assura VR implanted by Dr Rayann Heman for primary prevention  . Cholecystectomy    . Wrist surgery Bilateral   . Knee cartilage surgery    . Bi-ventricular implantable cardioverter defibrillator N/A 11/10/2012    SJM Forify Assura single chamber ICD  . Cardiac catheterization N/A 08/21/2015    Procedure: Right/Left Heart Cath and Coronary Angiography;  Surgeon: Jolaine Artist, MD;  Location: [facility];  Service: Cardiovascular;  Laterality: N/A;    Social History: Social History  Substance Use Topics  . Smoking status: Former Smoker    Quit date: 04/12/2001  . Smokeless tobacco: Never Used  . Alcohol Use: No   Additional social history: lives with brother. Quit smoking years ago - [id number], denies EtOH/illicit drugs Please also refer to relevant sections of EMR.  Family History: Family History  Problem Relation Age of Onset  . Diabetes Mellitus II Mother   . Coronary artery disease Father     CABG x 3   . Breast cancer Cousin   . Breast cancer Cousin   . Uterine cancer Cousin   . Lung cancer Mother   . Congestive Heart Failure Mother   . Hypertension Father   . Diabetes Mellitus II Father     Allergies and Medications: Allergies  Allergen Reactions  . Bee Venom Anaphylaxis  . Other Other (See Comments)    ANY TYPES OF METAL-CAUSES BLISTERS SKIN AND ITCHING   . Aspirin Hives  . Ciprofloxacin Hives  . Codeine Nausea Only    "can take ONLY if she eats with med"  . Naproxen Hives  . Sulfonamide Derivatives Hives  . Nsaids Itching and Rash  . Tape Rash    Also allergic to metal   No current facility-administered medications on file prior to encounter.   Current Outpatient Prescriptions on File Prior to Encounter  Medication Sig Dispense Refill  .  acetaminophen (TYLENOL ARTHRITIS PAIN) 650 MG CR tablet Take 1,300 mg by mouth every 8 (eight) hours as needed. pain    . albuterol (PROVENTIL HFA) 108 (90 BASE) MCG/ACT inhaler Inhale 2 puffs into the lungs every 6 (six) hours as needed. For shortness of breath    . amiodarone (PACERONE) 200 MG tablet Take 1 tablet (200 mg total) by mouth daily. 30 tablet 6  . atorvastatin (LIPITOR) 40 MG tablet Take 40 mg by mouth daily.      . busPIRone (BUSPAR) 30 MG tablet Take 30 mg by mouth 2 (two) times daily.     . carvedilol (COREG) 12.5 MG tablet Take 1 tablet (12.5 mg total) by mouth 2 (two) times daily with a meal. 180 tablet 3  . digoxin (LANOXIN) 0.125 MG tablet Take 1 tablet (0.125 mg total) by mouth every other day. 90 tablet 3  . docusate sodium (COLACE) 100 MG capsule Take 3 capsules (300 mg total) by mouth at bedtime. 10 capsule 0  . DULoxetine (CYMBALTA) 30 MG capsule Take 30 mg by mouth daily.    . furosemide (LASIX) 20 MG tablet 40 mg twice a day (Patient taking differently: Take 40 mg by mouth 2 (two) times daily. 40 mg twice a day) 60 tablet 3  . Garlic 123XX123 MG CAPS Take 1 capsule by mouth daily. Reported on 08/19/2015    .  Glucosamine 500 MG CAPS Take 1 capsule by mouth daily.      . ivabradine (CORLANOR) 5 MG TABS tablet Take 1 tablet (5 mg total) by mouth 2 (two) times daily with a meal. 60 tablet 3  . levothyroxine (SYNTHROID, LEVOTHROID) 75 MCG tablet Take 75 mcg by mouth daily before breakfast.     . metolazone (ZAROXOLYN) 2.5 MG tablet Take 2.5 mg by mouth as needed (for weight gain >3 lbs).    . mometasone (NASONEX) 50 MCG/ACT nasal spray Place 1 spray into both nostrils daily. allergies    . omega-3 acid ethyl esters (LOVAZA) 1 G capsule Take 2 g by mouth 2 (two) times daily.     . pantoprazole (PROTONIX) 40 MG tablet Take 40 mg by mouth 2 (two) times daily.      . potassium chloride SA (K-DUR,KLOR-CON) 20 MEQ tablet Take 2 tablets (40 mEq total) by mouth 2 (two) times daily. 120 tablet 6  . ramipril (ALTACE) 5 MG capsule Take 1 capsule (5 mg total) by mouth at bedtime. 30 capsule 6  . rivaroxaban (XARELTO) 20 MG TABS tablet Take 1 tablet (20 mg total) by mouth daily with supper. 30 tablet 11  . rOPINIRole (REQUIP) 1 MG tablet Take 1 mg by mouth at bedtime.      Marland Kitchen spironolactone (ALDACTONE) 25 MG tablet Take 25 mg by mouth daily.    Marland Kitchen tiotropium (SPIRIVA) 18 MCG inhalation capsule Place 18 mcg into inhaler and inhale daily.      . vitamin B-12 (CYANOCOBALAMIN) 1000 MCG tablet Take 1,000 mcg by mouth daily.    . vitamin C (ASCORBIC ACID) 500 MG tablet Take 500 mg by mouth daily.    . vitamin E 400 UNIT capsule Take 400 Units by mouth daily.    . Insulin Glargine (TOUJEO SOLOSTAR [place]) Inject 110 Units into the skin daily.     . insulin lispro (HUMALOG) 100 UNIT/ML injection Inject 40 Units into the skin 2 (two) times daily with a meal.     . Insulin Pen Needle (B-D ULTRAFINE III SHORT PEN) 31G X 8  MM MISC 18/day as directed dx 250.00 500 each 11  . nitroGLYCERIN (NITROSTAT) 0.4 MG SL tablet Place 1 tablet (0.4 mg total) under the tongue every 5 (five) minutes as needed. For chest pain 25 tablet 3  .  SUMAtriptan (IMITREX) 25 MG tablet Take 25 mg by mouth every 2 (two) hours as needed. For migraine      Objective: BP 115/50 mmHg  Pulse 78  Temp(Src) 97.8 F (36.6 C) (Oral)  Resp 12  SpO2 99% Exam: Gen: appears well, sitting on the edge of the bed without distress Eyes: conjunctival palor. Blurry vision resolved. PERRL, EOMI Oropharynx: clear, moist Neck: supple, no LAD CV: regular rate and rythm. S1 & S2 audible, no murmurs, no edema. Resp: no apparent work of breathing, clear to auscultation bilaterally. GI: bowel sounds normal, no tenderness to palpation. Abdominal hernia bilaterally GU: no suprapubic tenderness Skin: no lesion/rash Neuro: AO x4. No gross deficit Psych: euthymic, appropriate affect   Labs and Imaging: CBC BMET   Recent Labs Lab 11/14/15 1240  WBC 12.1*  HGB 7.7*  HCT 25.5*  PLT 424*    Recent Labs Lab 11/14/15 1240  NA 134*  K 3.1*  CL 92*  CO2 28  BUN 55*  CREATININE 1.58*  GLUCOSE 152*  CALCIUM 10.7*      EKG RBBB, no significant change from previous  D dimer <0.27 INR 2.16 Troponin 0.03 BNP 216.4  Dg Chest 2 View  11/14/2015  CLINICAL DATA:  Weakness and dizziness for 3 days, history CHF, hypertension, atrial fibrillation, COPD, asthma, former smoker EXAM: CHEST  2 VIEW COMPARISON:  Prior exams are not available at this time FINDINGS: LEFT subclavian transvenous AICD lead projects at RIGHT ventricle. Enlargement of cardiac silhouette. Mediastinal contours and pulmonary vascularity normal. Atherosclerotic calcification aorta. Emphysematous changes with mild RIGHT basilar atelectasis. No gross failure or consolidation. No pleural effusion or pneumothorax. Bones demineralized. IMPRESSION: Enlargement of cardiac silhouette post AICD. Emphysematous changes with subsegmental atelectasis at RIGHT base. Electronically Signed   By: Lavonia Dana M.D.   On: 11/14/2015 13:39   Ct Head Wo Contrast  11/14/2015  CLINICAL DATA:  Generalized weakness  and blurry vision EXAM: CT HEAD WITHOUT CONTRAST TECHNIQUE: Contiguous axial images were obtained from the base of the skull through the vertex without intravenous contrast. COMPARISON:  None. FINDINGS: There is no evidence of mass effect, midline shift or extra-axial fluid collections. There is no evidence of a space-occupying lesion or intracranial hemorrhage. There is no evidence of a cortical-based area of acute infarction. The ventricles and sulci are appropriate for the patient's age. The basal cisterns are patent. Visualized portions of the orbits are unremarkable. The visualized portions of the paranasal sinuses and mastoid air cells are unremarkable. The osseous structures are unremarkable. IMPRESSION: No acute intracranial pathology. Electronically Signed   By: Kathreen Devoid   On: 11/14/2015 13:57    Mercy Riding, MD 11/14/2015, 2:39 PM PGY-1, [facility] pager: [phone], text pages welcome   Upper Level Addendum:  I have seen and evaluated this patient along with Dr. Cyndia Skeeters and reviewed the above note, making necessary revisions in pink.  Virginia Crews, MD, MPH PGY-2,  [facility] 11/14/2015 5:48 PM

## 2015-11-14 NOTE — ED Notes (Signed)
Pt. O2 Sat 100% RA with ambulation.

## 2015-11-14 NOTE — ED Notes (Signed)
Pt. BIB [facility] for evaluation of blurred vision and weakness starting today. Pt. Was on her way to heart and vascular clinic when episode happened. Pt. States vision has improved at this time. Per EMS pt. Was able to park car successfully in parking spot. Pt. With hx of afib. AxO x4.

## 2015-11-14 NOTE — ED Provider Notes (Addendum)
CSN: [id number]     Arrival date & time 11/14/15  1047 History   First MD Initiated Contact with Patient 11/14/15 1140     Chief Complaint  Patient presents with  . Blurred Vision  . Weakness     (Consider location/radiation/quality/duration/timing/severity/associated sxs/prior Treatment) HPI Comments: 60 y/o female with PMHx significant for CHF, T2DM, COPD, and AFib presents for worsening weakness x3 weeks, and 2 hour episode of blurred vision this AM.  Pt reports on her way to cardiology appointment her vision became blurred and she called EMS for transportation to [facility]. She is unsure if the bluured vision was in both of her eyes, or just one. She had no associated numbness, tingling, focal weakness, dizziness at that time.  Pt also reports worsening weakness and dib. Previously was able to walk "3-4 times around [place]" and now has difficulty breathing with very few steps. Pt reports stable orthopnea and weight gain of around 3 lbs. Pt has no hx of PE, DVT and denies any exogenous estrogen use, long distance travels or surgery in the past 6 weeks, active cancer, recent immobilization. She is on a NOAC due to afib hx.   ROS 10 Systems reviewed and are negative for acute change except as noted in the HPI.      The history is provided by the patient.    Past Medical History  Diagnosis Date  . 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 DYSLIPIDEMIA 05/01/2009  . OBESITY 07/24/2007  . OBSTRUCTIVE SLEEP APNEA 07/24/2007    with CPAP compliance  . RESTLESS LEG SYNDROME 07/24/2007  . SYSTOLIC HEART FAILURE, CHRONIC 01/05/2009    a. chemo induced cardiomyopathy b. cMRI (02/2009) EF 45% c. Myoview 05/2010: EF 59%, no ischemia d. RHC (01/2012) RA 20, RV 55/13/22, PA 56/34 (44), PCWP 28, Fick CO/CI: 3.1 / 1.5, PVR 5.3 WU, PA sat 42% & 48% e. ECHO (10/2012) EF 20-25%, diff HK, MV calcified annulus f. ECHO (03/2014) EF 30-35%, sev HK, inferior/inferoseptal walls, mild MR  . 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  Hypertension   . History of breast cancer 2006    with return in 2008, s/p mastectomy, XRT and chemotherapy  . Osteoarthritis   . Anxiety   . Depression     followed by a psychiatrist  . Asthma   . GERD (gastroesophageal reflux disease)   . Gastroparesis   . IBS (irritable bowel syndrome)   . Myocardial infarction ([place])   . ICD (implantable cardiac defibrillator) in place 11/10/2012    ICD (11/2012)  . COPD (chronic obstructive pulmonary disease) ([place])   . Persistent atrial fibrillation ([place]) 09/2014   Past Surgical History  Procedure Laterality Date  . Mastectomy      right  . Abdominal hysterectomy  1991  . Cardiac defibrillator placement  11/10/2012    SJM Fortify Assura VR implanted by Dr Rayann Heman for primary prevention  . Cholecystectomy    . Wrist surgery Bilateral   . Knee cartilage surgery    . Bi-ventricular implantable cardioverter defibrillator N/A 11/10/2012    SJM Forify Assura single chamber ICD  . Cardiac catheterization N/A 08/21/2015    Procedure: Right/Left Heart Cath and Coronary Angiography;  Surgeon: Jolaine Artist, MD;  Location: [facility];  Service: Cardiovascular;  Laterality: N/A;   Family History  Problem Relation Age of Onset  . Diabetes Mellitus II Mother   . Coronary artery disease Father     CABG x 3  . Breast cancer Cousin   . Breast cancer  Cousin   . Uterine cancer Cousin   . Lung cancer Mother   . Congestive Heart Failure Mother   . Hypertension Father   . Diabetes Mellitus II Father    Social History  Substance Use Topics  . Smoking status: Former Smoker    Quit date: 04/12/2001  . Smokeless tobacco: Never Used  . Alcohol Use: No   OB History    No data available     Review of Systems    Allergies  Bee venom; Other; Aspirin; Ciprofloxacin; Codeine; Naproxen; Sulfonamide derivatives; Nsaids; and Tape  Home Medications   Prior to Admission medications   Medication Sig Start Date End Date Taking? Authorizing Provider   acetaminophen (TYLENOL ARTHRITIS PAIN) 650 MG CR tablet Take 1,300 mg by mouth every 8 (eight) hours as needed. pain   Yes Historical Provider, MD  albuterol (PROVENTIL HFA) 108 (90 BASE) MCG/ACT inhaler Inhale 2 puffs into the lungs every 6 (six) hours as needed. For shortness of breath   Yes Historical Provider, MD  amiodarone (PACERONE) 200 MG tablet Take 1 tablet (200 mg total) by mouth daily. 09/05/15  Yes Amy D Clegg, NP  atorvastatin (LIPITOR) 40 MG tablet Take 40 mg by mouth daily.     Yes Historical Provider, MD  busPIRone (BUSPAR) 30 MG tablet Take 30 mg by mouth 2 (two) times daily.    Yes Historical Provider, MD  carvedilol (COREG) 12.5 MG tablet Take 1 tablet (12.5 mg total) by mouth 2 (two) times daily with a meal. 09/22/15  Yes Thompson Grayer, MD  digoxin (LANOXIN) 0.125 MG tablet Take 1 tablet (0.125 mg total) by mouth every other day. 06/28/15  Yes Jolaine Artist, MD  docusate sodium (COLACE) 100 MG capsule Take 3 capsules (300 mg total) by mouth at bedtime. 10/03/15  Yes Amy D Clegg, NP  DULoxetine (CYMBALTA) 30 MG capsule Take 30 mg by mouth daily.   Yes Amy Moon, NP  furosemide (LASIX) 20 MG tablet 40 mg twice a day Patient taking differently: Take 40 mg by mouth 2 (two) times daily. 40 mg twice a day 08/22/15  Yes Amy D Clegg, NP  Garlic 123XX123 MG CAPS Take 1 capsule by mouth daily. Reported on 08/19/2015   Yes Historical Provider, MD  Glucosamine 500 MG CAPS Take 1 capsule by mouth daily.     Yes Historical Provider, MD  hydrOXYzine (ATARAX/VISTARIL) 50 MG tablet Take 50 mg by mouth every 6 (six) hours as needed for itching.   Yes Historical Provider, MD  insulin lispro (HUMALOG) 100 UNIT/ML injection Inject 0-12 Units into the skin 3 (three) times daily before meals.   Yes Historical Provider, MD  insulin lispro protamine-lispro (HUMALOG 75/25 MIX) (75-25) 100 UNIT/ML SUSP injection Inject 60 Units into the skin 2 (two) times daily with a meal.   Yes Historical Provider, MD   ivabradine (CORLANOR) 5 MG TABS tablet Take 1 tablet (5 mg total) by mouth 2 (two) times daily with a meal. 08/08/15  Yes Amy D Clegg, NP  levothyroxine (SYNTHROID, LEVOTHROID) 75 MCG tablet Take 75 mcg by mouth daily before breakfast.    Yes Historical Provider, MD  metolazone (ZAROXOLYN) 2.5 MG tablet Take 2.5 mg by mouth as needed (for weight gain >3 lbs).   Yes Historical Provider, MD  mometasone (NASONEX) 50 MCG/ACT nasal spray Place 1 spray into both nostrils daily. allergies   Yes Historical Provider, MD  omega-3 acid ethyl esters (LOVAZA) 1 G capsule Take 2 g by mouth  2 (two) times daily.    Yes Historical Provider, MD  pantoprazole (PROTONIX) 40 MG tablet Take 40 mg by mouth 2 (two) times daily.     Yes Historical Provider, MD  potassium chloride SA (K-DUR,KLOR-CON) 20 MEQ tablet Take 2 tablets (40 mEq total) by mouth 2 (two) times daily. 09/05/15  Yes Amy D Clegg, NP  ramipril (ALTACE) 5 MG capsule Take 1 capsule (5 mg total) by mouth at bedtime. 08/22/15  Yes Amy D Clegg, NP  rivaroxaban (XARELTO) 20 MG TABS tablet Take 1 tablet (20 mg total) by mouth daily with supper. 09/22/15  Yes Thompson Grayer, MD  rOPINIRole (REQUIP) 1 MG tablet Take 1 mg by mouth at bedtime.     Yes Historical Provider, MD  spironolactone (ALDACTONE) 25 MG tablet Take 25 mg by mouth daily.   Yes Historical Provider, MD  tiotropium (SPIRIVA) 18 MCG inhalation capsule Place 18 mcg into inhaler and inhale daily.     Yes Historical Provider, MD  vitamin B-12 (CYANOCOBALAMIN) 1000 MCG tablet Take 1,000 mcg by mouth daily.   Yes Historical Provider, MD  vitamin C (ASCORBIC ACID) 500 MG tablet Take 500 mg by mouth daily.   Yes Historical Provider, MD  vitamin E 400 UNIT capsule Take 400 Units by mouth daily.   Yes Historical Provider, MD  Insulin Glargine (TOUJEO SOLOSTAR [place]) Inject 110 Units into the skin daily.     Historical Provider, MD  insulin lispro (HUMALOG) 100 UNIT/ML injection Inject 40 Units into the skin 2 (two)  times daily with a meal.     Historical Provider, MD  Insulin Pen Needle (B-D ULTRAFINE III SHORT PEN) 31G X 8 MM MISC 18/day as directed dx 250.00 08/01/11   Renato Shin, MD  nitroGLYCERIN (NITROSTAT) 0.4 MG SL tablet Place 1 tablet (0.4 mg total) under the tongue every 5 (five) minutes as needed. For chest pain 10/18/15   Jolaine Artist, MD  SUMAtriptan (IMITREX) 25 MG tablet Take 25 mg by mouth every 2 (two) hours as needed. For migraine    Historical Provider, MD   BP 122/60 mmHg  Pulse 81  Temp(Src) 97.8 F (36.6 C) (Oral)  Resp 13  SpO2 100% Physical Exam  Constitutional: She is oriented to person, place, and time. She appears well-developed.  HENT:  Head: Normocephalic and atraumatic.  Eyes: Conjunctivae and EOM are normal. Pupils are equal, round, and reactive to light.  Neck: Normal range of motion. Neck supple. No JVD present.  Cardiovascular: Normal rate, regular rhythm and normal heart sounds.   Pulmonary/Chest: Effort normal and breath sounds normal. No respiratory distress. She has no rales.  Abdominal: Soft. Bowel sounds are normal. She exhibits no distension. There is no tenderness. There is no rebound and no guarding.  Musculoskeletal: She exhibits no edema or tenderness.  Neurological: She is alert and oriented to person, place, and time. No cranial nerve deficit. Coordination normal.  Skin: Skin is warm and dry.  Nursing note and vitals reviewed.   ED Course  Procedures (including critical care time) Labs Review Labs Reviewed  CBC WITH DIFFERENTIAL/PLATELET - Abnormal; Notable for the following:    WBC 12.1 (*)    RBC 2.92 (*)    Hemoglobin 7.7 (*)    HCT 25.5 (*)    RDW 17.2 (*)    Platelets 424 (*)    Neutro Abs 8.8 (*)    Monocytes Absolute 1.1 (*)    All other components within normal limits  BASIC METABOLIC PANEL -  Abnormal; Notable for the following:    Sodium 134 (*)    Potassium 3.1 (*)    Chloride 92 (*)    Glucose, Bld 152 (*)    BUN 55  (*)    Creatinine, Ser 1.58 (*)    Calcium 10.7 (*)    GFR calc non Af Amer 35 (*)    GFR calc Af Amer 40 (*)    All other components within normal limits  BRAIN NATRIURETIC PEPTIDE - Abnormal; Notable for the following:    B Natriuretic Peptide 216.4 (*)    All other components within normal limits  PROTIME-INR - Abnormal; Notable for the following:    Prothrombin Time 23.9 (*)    INR 2.16 (*)    All other components within normal limits  RETICULOCYTES - Abnormal; Notable for the following:    Retic Ct Pct 7.1 (*)    RBC. 2.94 (*)    Retic Count, Manual 208.7 (*)    All other components within normal limits  POC OCCULT BLOOD, ED - Abnormal; Notable for the following:    Fecal Occult Bld POSITIVE (*)    All other components within normal limits  TROPONIN I  D-DIMER, QUANTITATIVE (NOT AT [facility])  VITAMIN B12  FOLATE  IRON AND TIBC  FERRITIN  TYPE AND SCREEN  PREPARE RBC (CROSSMATCH)    Imaging Review Dg Chest 2 View  11/14/2015  CLINICAL DATA:  Weakness and dizziness for 3 days, history CHF, hypertension, atrial fibrillation, COPD, asthma, former smoker EXAM: CHEST  2 VIEW COMPARISON:  Prior exams are not available at this time FINDINGS: LEFT subclavian transvenous AICD lead projects at RIGHT ventricle. Enlargement of cardiac silhouette. Mediastinal contours and pulmonary vascularity normal. Atherosclerotic calcification aorta. Emphysematous changes with mild RIGHT basilar atelectasis. No gross failure or consolidation. No pleural effusion or pneumothorax. Bones demineralized. IMPRESSION: Enlargement of cardiac silhouette post AICD. Emphysematous changes with subsegmental atelectasis at RIGHT base. Electronically Signed   By: Lavonia Dana M.D.   On: 11/14/2015 13:39   Ct Head Wo Contrast  11/14/2015  CLINICAL DATA:  Generalized weakness and blurry vision EXAM: CT HEAD WITHOUT CONTRAST TECHNIQUE: Contiguous axial images were obtained from the base of the skull through the vertex  without intravenous contrast. COMPARISON:  None. FINDINGS: There is no evidence of mass effect, midline shift or extra-axial fluid collections. There is no evidence of a space-occupying lesion or intracranial hemorrhage. There is no evidence of a cortical-based area of acute infarction. The ventricles and sulci are appropriate for the patient's age. The basal cisterns are patent. Visualized portions of the orbits are unremarkable. The visualized portions of the paranasal sinuses and mastoid air cells are unremarkable. The osseous structures are unremarkable. IMPRESSION: No acute intracranial pathology. Electronically Signed   By: Kathreen Devoid   On: 11/14/2015 13:57   I have personally reviewed and evaluated these images and lab results as part of my medical decision-making.   EKG Interpretation None      MDM   Final diagnoses:  Anemia, unspecified anemia type  Transient cerebral ischemia, unspecified transient cerebral ischemia type  Symptomatic anemia    Pt comes in with cc of dib and blurry vision. Seems like the dib is exertional and slowly worsening. Same is the case for fatigue. Exam not consistent with CHF exacerbation. CXR ordered, if no significant process, will have to consider PE on the ddx despite her being on NOAC.   @2 :18 pm: Pt's Hb is 7. It was 11 prior.  I had ordered a dimer, it is already running, but WE DONT THINK PE NEEDS TO BE LOOKED INTO ANYMORE, as the new onset anemia will likely explain the worsening weakness.   Varney Biles, MD 11/14/15 1419  @4 :00 Pt will be transfused 2 units. She has hemoccult + stools. Her BUN is in the 50s - could be pre-renal, as she has CHF, or could be a sign of UGIB. We have spoken with GI team - protonix for now, they will see patient tomorrow.  CRITICAL CARE Performed by: Varney Biles   Total critical care time: 50 minutes  Critical care time was exclusive of separately billable procedures and treating other  patients.  Critical care was necessary to treat or prevent imminent or life-threatening deterioration.  Critical care was time spent personally by me on the following activities: development of treatment plan with patient and/or surrogate as well as nursing, discussions with consultants, evaluation of patient's response to treatment, examination of patient, obtaining history from patient or surrogate, ordering and performing treatments and interventions, ordering and review of laboratory studies, ordering and review of radiographic studies, pulse oximetry and re-evaluation of patient's condition.    Varney Biles, MD 11/14/15 1624

## 2015-11-14 NOTE — Progress Notes (Signed)
Discussed with Dr. Alvera Novel.  I will cosign the resident H&PE when available.  Briefly, 60 yo complicated female with insulin requiring type 2 DM and HFrEF presents with worsening DOE, lightheadedness and what I would characterize as a near syncopal spell.  As best we can tell, symptomatic anemia seems to be her new problem.  She has had chronically black stools since November.  She is also constipated with BM only every 3-4 days.  Recently started on blood thinner and no increased frequency of stools which are still black. She takes PPI twice daily.  Also has recent colonoscopy 2015 - previous significant polyps.  Only very small polyps per patient in 2015. Issues: DM and CHF appear stable.  (Weighs daily and is followed by HF team.) Acute issue is symptomatic anemia.  Sounds like ongoing slow loss for months.  Although it is tempting to say made worse by blood thinner started 3 weeks ago, the history suggests that it did not make much of a difference.  Black stools and lack of other noticeable bleeding highly suggest GI source.  Admit, transfuse, hold xarelto, consult GI, notify heart failure team.  DM per usual.

## 2015-11-15 ENCOUNTER — Telehealth: Payer: Self-pay | Admitting: Cardiology

## 2015-11-15 DIAGNOSIS — D649 Anemia, unspecified: Secondary | ICD-10-CM

## 2015-11-15 DIAGNOSIS — K922 Gastrointestinal hemorrhage, unspecified: Secondary | ICD-10-CM | POA: Insufficient documentation

## 2015-11-15 DIAGNOSIS — I5022 Chronic systolic (congestive) heart failure: Secondary | ICD-10-CM

## 2015-11-15 DIAGNOSIS — I10 Essential (primary) hypertension: Secondary | ICD-10-CM

## 2015-11-15 DIAGNOSIS — D500 Iron deficiency anemia secondary to blood loss (chronic): Secondary | ICD-10-CM | POA: Insufficient documentation

## 2015-11-15 DIAGNOSIS — I480 Paroxysmal atrial fibrillation: Secondary | ICD-10-CM

## 2015-11-15 LAB — CBC
HEMATOCRIT: 24.8 % — AB (ref 36.0–46.0)
HEMATOCRIT: 25.3 % — AB (ref 36.0–46.0)
Hemoglobin: 7.7 g/dL — ABNORMAL LOW (ref 12.0–15.0)
Hemoglobin: 7.9 g/dL — ABNORMAL LOW (ref 12.0–15.0)
MCH: 27 pg (ref 26.0–34.0)
MCH: 27.1 pg (ref 26.0–34.0)
MCHC: 31 g/dL (ref 30.0–36.0)
MCHC: 31.2 g/dL (ref 30.0–36.0)
MCV: 87 fL (ref 78.0–100.0)
MCV: 86.6 fL (ref 78.0–100.0)
PLATELETS: 396 10*3/uL (ref 150–400)
Platelets: 379 10*3/uL (ref 150–400)
RBC: 2.85 MIL/uL — AB (ref 3.87–5.11)
RBC: 2.92 MIL/uL — ABNORMAL LOW (ref 3.87–5.11)
RDW: 16.6 % — ABNORMAL HIGH (ref 11.5–15.5)
RDW: 16.5 % — AB (ref 11.5–15.5)
WBC: 11.3 10*3/uL — AB (ref 4.0–10.5)
WBC: 9.4 10*3/uL (ref 4.0–10.5)

## 2015-11-15 LAB — COMPREHENSIVE METABOLIC PANEL
ALK PHOS: 56 U/L (ref 38–126)
ALT: 20 U/L (ref 14–54)
AST: 24 U/L (ref 15–41)
Albumin: 3.1 g/dL — ABNORMAL LOW (ref 3.5–5.0)
Anion gap: 11 (ref 5–15)
BILIRUBIN TOTAL: 2.5 mg/dL — AB (ref 0.3–1.2)
BUN: 51 mg/dL — AB (ref 6–20)
CO2: 28 mmol/L (ref 22–32)
CREATININE: 1.55 mg/dL — AB (ref 0.44–1.00)
Calcium: 9.9 mg/dL (ref 8.9–10.3)
Chloride: 92 mmol/L — ABNORMAL LOW (ref 101–111)
GFR calc Af Amer: 41 mL/min — ABNORMAL LOW (ref >60)
GFR, EST NON AFRICAN AMERICAN: 35 mL/min — AB (ref >60)
Glucose, Bld: 233 mg/dL — ABNORMAL HIGH (ref 65–99)
Potassium: 3.5 mmol/L (ref 3.5–5.1)
Sodium: 131 mmol/L — ABNORMAL LOW (ref 135–145)
TOTAL PROTEIN: 6.7 g/dL (ref 6.5–8.1)

## 2015-11-15 LAB — HEMOGLOBIN A1C
Hgb A1c MFr Bld: 7.3 % — ABNORMAL HIGH (ref 4.8–5.6)
Mean Plasma Glucose: 163 mg/dL

## 2015-11-15 LAB — GLUCOSE, CAPILLARY
GLUCOSE-CAPILLARY: 256 mg/dL — AB (ref 65–99)
Glucose-Capillary: 294 mg/dL — ABNORMAL HIGH (ref 65–99)
Glucose-Capillary: 257 mg/dL — ABNORMAL HIGH (ref 65–99)
Glucose-Capillary: 234 mg/dL — ABNORMAL HIGH (ref 65–99)
Glucose-Capillary: 213 mg/dL — ABNORMAL HIGH (ref 65–99)

## 2015-11-15 LAB — PROTIME-INR
INR: 1.73 — ABNORMAL HIGH (ref 0.00–1.49)
PROTHROMBIN TIME: 20.3 s — AB (ref 11.6–15.2)

## 2015-11-15 LAB — TROPONIN I: Troponin I: <0.03 ng/mL (ref <0.031)

## 2015-11-15 MED ORDER — POLYETHYLENE GLYCOL 3350 17 G PO PACK
17.0000 g | PACK | Freq: Two times a day (BID) | ORAL | Status: DC
  Administered 2015-11-15 – 2015-11-18 (×3): 17 g via ORAL
  Filled 2015-11-15 (×4): qty 1

## 2015-11-15 NOTE — Telephone Encounter (Signed)
Attempted to call pt and request a manual transmission. Pt son informed me that pt is in the hospital and he will have her call me when she gets home.

## 2015-11-15 NOTE — Progress Notes (Signed)
Inpatient Diabetes Program Recommendations  AACE/ADA: New Consensus Statement on Inpatient Glycemic Control (2015)  Target Ranges:  Prepandial:   less than 140 mg/dL      Peak postprandial:   less than 180 mg/dL (1-2 hours)      Critically ill patients:  140 - 180 mg/dL   Review of Glycemic Control  Diabetes history: DM2 Outpatient Diabetes medications: Toujeo 110 units QD, Humalog 40 units bid and 0-12 units tidwc Current orders for Inpatient glycemic control: Lantus 56 units QHS, Novolog moderate tidwc and hs On FL diet then NPO after MN.  Inpatient Diabetes Program Recommendations:    Increase Lantus to 75 units QHS Change Novolog to moderate Q4H.  Will continue to follow.  Thank you. Lorenda Peck, RD, LDN, CDE Inpatient Diabetes Coordinator [phone]

## 2015-11-15 NOTE — Progress Notes (Signed)
Family Medicine Teaching Service Daily Progress Note Intern Pager: [phone]  Patient name: [name] Medical record number: [id number] Date of birth: August 05, 1956 Age: 60 y.o. Gender: female  Primary Care Provider: Nicoletta Dress, MD Consultants: GI, Card Code Status: DNR  Pt Overview and Major Events to Date:  04/11 admitted with symptomatic anemia and transfused one unit of blood  Assessment and Plan: [name] is a 60 y.o. female presenting with sudden blurry vision and gradually worsening shortness of breath . 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  Symptomatic Normocytic Anemia: improved. Hgb 7.7 on admission (11.6 about three months ago). Dyspnea and weakness better this morning. Post-transfusion Hgb 7.7 this morning. Denies bleeding or chest pain. Anemia panel with low ferritin to 6, high TIBC to 560 and low saturation ratio to 5% suggesting IDA likely secondary to blood loss. RDW elevated to 16.6 making production issue less likely. Retic index 2.8 suggesting adequate reticulocyte index. -Typed and crossed on 4/11 -s/p one unit pRBC. Hgb 7.7> 1u > 7.7 - will check CBC this afternoon - GI consult - appreciate recs -NPO after midnight - Cardiac monitoring  Nonanginal chest pain: nonanginal in nature. She had brief sharp pain that lasted seconds while in EMS. No chest pain here. Initial EKG appears sinus with LAD and ?LBBB. Troponin negative here. Repeat EKG this morning with LBBB but QRS only 0.12. Still with LAD and 1sr degree AVB. Otherwise in SNR. -follow up card recs  AFib: recent diagnosis. She is in NSR now. CHAD-Vasc score 5. HAS-BLED score 2 (1.88 bleeds per 100  patient-years). On amiodarone and Xarelto at home -Hold Xarelto in the setting of bleeding -Continue Amiodrone, BB, and digoxin -Card c/s - appreciate recs  HFrEF: Echo 08/2015 with EF 30-35%, diffuse hypokinesis. Chemo induced cardiomyopathy s/p St. Jude's ICD in 2014. On coreg, digoxin, Ivabradine, spironolactone and ramipril, lasix and metolazone. No sign of fluid overload this morning.  -Hold the spironolactone, metalozone and ACEi in the setting of AKI and tenuous BP with GIB -Continue other home meds as BP tolerates -daily weights and strict Is&Os. UOP 1.5L  Hypertension: normotensive, with some low BPs on arrival -continue meds as above  AKI: Cr 1.58>1.55 on admission. 0.98 about three weeks ago. Baseline appears to about 0.9. Likely prerenal in setting of volume contraction from anemia 2/2 GIB. -Hold nephrotoxic meds -encourage by mouth hydration - hold IVF in setting of HFrEF -BMP in am  Hypokalemia: resolved. K 3.1>3.5 -Replete with KDur 78mEq x1 now - Repeat BMET in AM  DM-2: on Humalog 75/25 60 units bid and Humalog 40 units twice a day with meals. A1c 7.3. CBGs 288, 233 and 256 this morning. Received 56 u of Lantus and 3 units of novolog -will increase Lantus to 70 today -moderate SSI  Hyperlipidemia: on atorvastatin -Continue home atorvastatin  OSA: on CPAP at home -Will continue CPAP qhs here  Restless leg syndrome: -continue home Ropinirole  Hypothyroidism: TSH 3.8 in 10/2015. On synthroid 75 mcg -continue home synthroid  Anxiety: stable. On buspirone at home -Continue home buspirone  FEN/GI:  -Card modified and heart health diet -NPO after midnight - SLIV  Prophylaxis: SCD in the setting of GI bleed  Disposition: continue SDU pending  GI work and stable H&H  Subjective:  Patient sitting on bed. Reports her breathing and fatigue is better. She reports itching which has been there for the last three weeks. She is on Atarax at home and didn't use one  here. Denies bleeding, chest pain and edema.  Objective: Temp:  [97 F (36.1 C)-98.1 F (36.7 C)] 97.3 F (36.3 C) (04/12 0728) Pulse Rate:  [65-93] 88 (04/12 0808) Resp:  [12-22] 22 (04/12 0700) BP: (88-134)/(32-99) 134/53 mmHg (04/12 0808) SpO2:  [94 %-100 %] 98 % (04/12 0700) Weight:  [220 lb 7.4 oz (100 kg)] 220 lb 7.4 oz (100 kg) (04/12 0500) Physical Exam: Gen: appears well, sitting on bed without distress Eyes: conjunctival palor. Blurry vision resolved. PERRL, EOMI Oropharynx: clear, moist Neck: supple, no LAD CV: regular rate and rythm. S1 & S2 audible, no murmurs, no edema. Resp: no apparent work of breathing, clear to auscultation bilaterally. GI: bowel sounds normal, no tenderness to palpation. Abdominal hernia bilaterally GU: no suprapubic tenderness Skin: no lesion/rash Neuro: AO x4. No gross deficit Psych: euthymic, appropriate affect  Laboratory:  Recent Labs Lab 11/14/15 1240 11/15/15 0213  WBC 12.1* 11.3*  HGB 7.7* 7.7*  HCT 25.5* 24.8*  PLT 424* 379    Recent Labs Lab 11/14/15 1240 11/15/15 0213  NA 134* 131*  K 3.1* 3.5  CL 92* 92*  CO2 28 28  BUN 55* 51*  CREATININE 1.58* 1.55*  CALCIUM 10.7* 9.9  PROT  --  6.7  BILITOT  --  2.5*  ALKPHOS  --  56  ALT  --  20  AST  --  24  GLUCOSE 152* 233*    Imaging/Diagnostic Tests: Dg Chest 2 View  11/14/2015  CLINICAL DATA:  Weakness and dizziness for 3 days, history CHF, hypertension, atrial fibrillation, COPD, asthma, former smoker EXAM: CHEST  2 VIEW COMPARISON:  Prior exams are not available at this time FINDINGS: LEFT subclavian transvenous AICD lead projects at RIGHT ventricle. Enlargement of cardiac silhouette. Mediastinal contours and pulmonary vascularity normal. Atherosclerotic calcification aorta. Emphysematous changes with mild RIGHT basilar atelectasis. No gross failure or consolidation. No pleural effusion or pneumothorax. Bones demineralized. IMPRESSION: Enlargement of cardiac  silhouette post AICD. Emphysematous changes with subsegmental atelectasis at RIGHT base. Electronically Signed   By: Lavonia Dana M.D.   On: 11/14/2015 13:39   Ct Head Wo Contrast  11/14/2015  CLINICAL DATA:  Generalized weakness and blurry vision EXAM: CT HEAD WITHOUT CONTRAST TECHNIQUE: Contiguous axial images were obtained from the base of the skull through the vertex without intravenous contrast. COMPARISON:  None. FINDINGS: There is no evidence of mass effect, midline shift or extra-axial fluid collections. There is no evidence of a space-occupying lesion or intracranial hemorrhage. There is no evidence of a cortical-based area of acute infarction. The ventricles and sulci are appropriate for the patient's age. The basal cisterns are patent. Visualized portions of the orbits are unremarkable. The visualized portions of the paranasal sinuses and mastoid air cells are unremarkable. The osseous structures are unremarkable. IMPRESSION: No acute intracranial pathology. Electronically Signed   By: Kathreen Devoid   On: 11/14/2015 13:57    Mercy Riding, MD 11/15/2015, 9:18 AM PGY-1, [place] Intern pager: [phone], text pages welcome

## 2015-11-16 ENCOUNTER — Inpatient Hospital Stay (HOSPITAL_COMMUNITY): Payer: Medicare Other | Admitting: Certified Registered Nurse Anesthetist

## 2015-11-16 ENCOUNTER — Encounter (HOSPITAL_COMMUNITY)
Admission: EM | Disposition: A | Discharge status: Home / Self Care | Payer: Self-pay | Source: Home / Self Care | Attending: Family Medicine

## 2015-11-16 ENCOUNTER — Encounter (HOSPITAL_COMMUNITY): Payer: Self-pay | Admitting: *Deleted

## 2015-11-16 HISTORY — PX: ESOPHAGOGASTRODUODENOSCOPY: SHX5428

## 2015-11-16 LAB — GLUCOSE, CAPILLARY
GLUCOSE-CAPILLARY: 240 mg/dL — AB (ref 65–99)
GLUCOSE-CAPILLARY: 213 mg/dL — AB (ref 65–99)
GLUCOSE-CAPILLARY: 180 mg/dL — AB (ref 65–99)
GLUCOSE-CAPILLARY: 251 mg/dL — AB (ref 65–99)

## 2015-11-16 LAB — CBC
HEMATOCRIT: 23.7 % — AB (ref 36.0–46.0)
Hemoglobin: 7.3 g/dL — ABNORMAL LOW (ref 12.0–15.0)
MCH: 27.2 pg (ref 26.0–34.0)
MCHC: 30.8 g/dL (ref 30.0–36.0)
MCV: 88.4 fL (ref 78.0–100.0)
Platelets: 344 10*3/uL (ref 150–400)
RBC: 2.68 MIL/uL — ABNORMAL LOW (ref 3.87–5.11)
RDW: 16.8 % — AB (ref 11.5–15.5)
WBC: 8 10*3/uL (ref 4.0–10.5)

## 2015-11-16 LAB — BASIC METABOLIC PANEL
Anion gap: 11 (ref 5–15)
BUN: 27 mg/dL — ABNORMAL HIGH (ref 6–20)
CALCIUM: 9.8 mg/dL (ref 8.9–10.3)
CO2: 29 mmol/L (ref 22–32)
CREATININE: 1.08 mg/dL — AB (ref 0.44–1.00)
Chloride: 97 mmol/L — ABNORMAL LOW (ref 101–111)
GFR, EST NON AFRICAN AMERICAN: 55 mL/min — AB (ref >60)
Glucose, Bld: 262 mg/dL — ABNORMAL HIGH (ref 65–99)
Potassium: 3.8 mmol/L (ref 3.5–5.1)
Sodium: 137 mmol/L (ref 135–145)

## 2015-11-16 LAB — HEMOGLOBIN AND HEMATOCRIT, BLOOD
HCT: 28.2 % — ABNORMAL LOW (ref 36.0–46.0)
HEMOGLOBIN: 8.7 g/dL — AB (ref 12.0–15.0)

## 2015-11-16 LAB — PREPARE RBC (CROSSMATCH)

## 2015-11-16 SURGERY — EGD (ESOPHAGOGASTRODUODENOSCOPY)
Anesthesia: Monitor Anesthesia Care

## 2015-11-16 MED ORDER — ONDANSETRON HCL 4 MG/2ML IJ SOLN: 4.0000 mg | Freq: Once | INTRAMUSCULAR | Status: DC | PRN

## 2015-11-16 MED ORDER — PEG-KCL-NACL-NASULF-NA ASC-C 100 G PO SOLR
0.5000 | Freq: Once | ORAL | Status: AC
  Administered 2015-11-16: 100 g via ORAL
  Filled 2015-11-16: qty 1

## 2015-11-16 MED ORDER — FENTANYL CITRATE (PF) 100 MCG/2ML IJ SOLN: 25.0000 ug | INTRAMUSCULAR | Status: DC | PRN

## 2015-11-16 MED ORDER — PEG-KCL-NACL-NASULF-NA ASC-C 100 G PO SOLR: 1.0000 | Freq: Once | ORAL | Status: DC

## 2015-11-16 MED ORDER — LACTATED RINGERS IV SOLN
INTRAVENOUS | Status: DC | PRN
  Administered 2015-11-16: 15:00:00 via INTRAVENOUS

## 2015-11-16 MED ORDER — PROPOFOL 500 MG/50ML IV EMUL
INTRAVENOUS | Status: DC | PRN
  Administered 2015-11-16: 100 ug/kg/min via INTRAVENOUS

## 2015-11-16 MED ORDER — SODIUM CHLORIDE 0.9 % IV SOLN: Freq: Once | INTRAVENOUS | Status: DC

## 2015-11-16 MED ORDER — BISACODYL 5 MG PO TBEC
10.0000 mg | DELAYED_RELEASE_TABLET | Freq: Once | ORAL | Status: AC
  Administered 2015-11-16: 10 mg via ORAL
  Filled 2015-11-16: qty 2

## 2015-11-16 MED ORDER — METOCLOPRAMIDE HCL 5 MG/ML IJ SOLN: 10.0000 mg | Freq: Four times a day (QID) | INTRAMUSCULAR | Status: AC | PRN

## 2015-11-16 MED ORDER — BUTAMBEN-TETRACAINE-BENZOCAINE 2-2-14 % EX AERO
INHALATION_SPRAY | CUTANEOUS | Status: DC | PRN
  Administered 2015-11-16: 2 via TOPICAL

## 2015-11-16 MED ORDER — PANTOPRAZOLE SODIUM 40 MG PO TBEC
40.0000 mg | DELAYED_RELEASE_TABLET | Freq: Every day | ORAL | Status: DC
  Administered 2015-11-17 – 2015-11-18 (×2): 40 mg via ORAL
  Filled 2015-11-16 (×2): qty 1

## 2015-11-16 MED ORDER — SODIUM CHLORIDE 0.9 % IV SOLN: INTRAVENOUS | Status: DC

## 2015-11-16 MED ORDER — INSULIN GLARGINE 100 UNIT/ML ~~LOC~~ SOLN
60.0000 [IU] | Freq: Every day | SUBCUTANEOUS | Status: DC
Start: 2015-11-16 — End: 2015-11-17
  Administered 2015-11-16: 60 [IU] via SUBCUTANEOUS
  Filled 2015-11-16: qty 0.6

## 2015-11-16 MED ORDER — PEG-KCL-NACL-NASULF-NA ASC-C 100 G PO SOLR
0.5000 | Freq: Once | ORAL | Status: AC
  Administered 2015-11-17: 100 g via ORAL

## 2015-11-16 MED ORDER — FUROSEMIDE 10 MG/ML IJ SOLN
20.0000 mg | Freq: Once | INTRAMUSCULAR | Status: AC
  Administered 2015-11-16: 20 mg via INTRAVENOUS
  Filled 2015-11-16: qty 2

## 2015-11-16 NOTE — H&P (View-Only) (Signed)
[facility] Consult: 8:10 AM 11/15/2015  LOS: 1 day    Referring Provider: Dr Andria Frames  Primary Care Physician:  Nicoletta Dress, MD Primary Gastroenterologist:  In [place]    Reason for Consultation:  Anemia,    HPI: [name] is a 60 y.o. female.  Obesity.  Type 2 DM, on insulin.  OSA/CPAP.  CHF, chemotherapy related CM, EF 20%.  S/p ICD 2014.  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   Breast cancer 2005.  Breast lump on left, biopsy benign 07/2013 and1/23/17.  Diffusely fatty liver on CT in 2008.  Previous cholecystectomy.  Restless leg.  Gastroparesis. Hx of ulcer disease age 8, had EGD then.  No recurrent ulcers.  Remote anemia during child bearing years.  Polypharmacy.    2015 Colonoscopy in [place]: "small" "non-cancerous" polyps 1998 Colonoscopy. "normal" per archive.  No report located.    Started Eliquis 09/2015 for new onset afib.  No ASA due to allergy to this and to NSAIDs.   Pt in ED 4/11 with acute blurry vision.  3 weeks progressive dyspnea and 2 pillow orthopnea. Increased restless leg sxs.    Gives hx of streaking blood in stool she attributes to hemorrhoids.   "Black" stools for several months but constipated, BMs q 3 to 4 days despite 3 senna per day, uses mag citrate prn.  Takes PPI BID though does not have significant reflux disease.  No abdominal pain, no nausea, no anorexia except yesterday.  Weight fluctuates but stable overall.  No nose bleeds, bleeds/bruises readily but not excessively.  Never before transfused.  Not on iron at home.     hgb 7.7.  Was 11.6 to 12.7 in 08/2015.  MCV 87.  Again 7.7 post PRBC x 1.   FOBT +.  Coags elevated.  BUN 55, elevated >> than creatinine 1.5.  K low 3.1.   No CHF on xray.  BNP 216.  Head CT negative.    Fm Hx + for pancreatic and lung Ca in  mother. Also aunts with uterine, mat GM with breast cancer.      Past Medical History  Diagnosis Date  . 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 DYSLIPIDEMIA 05/01/2009  . OBESITY 07/24/2007  . OBSTRUCTIVE SLEEP APNEA 07/24/2007    with CPAP compliance  . RESTLESS LEG SYNDROME 07/24/2007  . SYSTOLIC HEART FAILURE, CHRONIC 01/05/2009    a. chemo induced cardiomyopathy b. cMRI (02/2009) EF 45% c. Myoview 05/2010: EF 59%, no ischemia d. RHC (01/2012) RA 20, RV 55/13/22, PA 56/34 (44), PCWP 28, Fick CO/CI: 3.1 / 1.5, PVR 5.3 WU, PA sat 42% & 48% e. ECHO (10/2012) EF 20-25%, diff HK, MV calcified annulus f. ECHO (03/2014) EF 30-35%, sev HK, inferior/inferoseptal walls, mild MR  . 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 Hypertension   . History of breast cancer 2006    with return in 2008, s/p mastectomy, XRT and chemotherapy  . Osteoarthritis   . Anxiety   . Depression     followed by a psychiatrist  . Asthma   . GERD (gastroesophageal reflux disease)   .  Gastroparesis   . IBS (irritable bowel syndrome)   . Myocardial infarction ([place])   . ICD (implantable cardiac defibrillator) in place 11/10/2012    ICD (11/2012)  . COPD (chronic obstructive pulmonary disease) ([place])   . Persistent atrial fibrillation ([place]) 09/2014    Past Surgical History  Procedure Laterality Date  . Mastectomy      right  . Abdominal hysterectomy  1991  . Cardiac defibrillator placement  11/10/2012    SJM Fortify Assura VR implanted by Dr Rayann Heman for primary prevention  . Cholecystectomy    . Wrist surgery Bilateral   . Knee cartilage surgery    . Bi-ventricular implantable cardioverter defibrillator N/A 11/10/2012    SJM Forify Assura single chamber ICD  . Cardiac catheterization N/A 08/21/2015    Procedure: Right/Left Heart Cath and Coronary Angiography;  Surgeon: Jolaine Artist, MD;  Location: [facility];  Service: Cardiovascular;  Laterality: N/A;    Prior to Admission medications   Medication Sig Start Date End Date Taking?  Authorizing Provider  acetaminophen (TYLENOL ARTHRITIS PAIN) 650 MG CR tablet Take 1,300 mg by mouth every 8 (eight) hours as needed. pain   Yes Historical Provider, MD  albuterol (PROVENTIL HFA) 108 (90 BASE) MCG/ACT inhaler Inhale 2 puffs into the lungs every 6 (six) hours as needed. For shortness of breath   Yes Historical Provider, MD  amiodarone (PACERONE) 200 MG tablet Take 1 tablet (200 mg total) by mouth daily. 09/05/15  Yes Amy D Clegg, NP  atorvastatin (LIPITOR) 40 MG tablet Take 40 mg by mouth daily.     Yes Historical Provider, MD  busPIRone (BUSPAR) 30 MG tablet Take 30 mg by mouth 2 (two) times daily.    Yes Historical Provider, MD  carvedilol (COREG) 12.5 MG tablet Take 1 tablet (12.5 mg total) by mouth 2 (two) times daily with a meal. 09/22/15  Yes Thompson Grayer, MD  digoxin (LANOXIN) 0.125 MG tablet Take 1 tablet (0.125 mg total) by mouth every other day. 06/28/15  Yes Jolaine Artist, MD  docusate sodium (COLACE) 100 MG capsule Take 3 capsules (300 mg total) by mouth at bedtime. 10/03/15  Yes Amy D Clegg, NP  DULoxetine (CYMBALTA) 30 MG capsule Take 30 mg by mouth daily.   Yes Amy Moon, NP  furosemide (LASIX) 20 MG tablet 40 mg twice a day Patient taking differently: Take 40 mg by mouth 2 (two) times daily. 40 mg twice a day 08/22/15  Yes Amy D Clegg, NP  Garlic 123XX123 MG CAPS Take 1 capsule by mouth daily. Reported on 08/19/2015   Yes Historical Provider, MD  Glucosamine 500 MG CAPS Take 1 capsule by mouth daily.     Yes Historical Provider, MD  hydrOXYzine (ATARAX/VISTARIL) 50 MG tablet Take 50 mg by mouth every 6 (six) hours as needed for itching.   Yes Historical Provider, MD  insulin lispro (HUMALOG) 100 UNIT/ML injection Inject 0-12 Units into the skin 3 (three) times daily before meals.   Yes Historical Provider, MD  insulin lispro protamine-lispro (HUMALOG 75/25 MIX) (75-25) 100 UNIT/ML SUSP injection Inject 60 Units into the skin 2 (two) times daily with a meal.   Yes Historical  Provider, MD  ivabradine (CORLANOR) 5 MG TABS tablet Take 1 tablet (5 mg total) by mouth 2 (two) times daily with a meal. 08/08/15  Yes Amy D Clegg, NP  levothyroxine (SYNTHROID, LEVOTHROID) 75 MCG tablet Take 75 mcg by mouth daily before breakfast.    Yes Historical Provider,  MD  metolazone (ZAROXOLYN) 2.5 MG tablet Take 2.5 mg by mouth as needed (for weight gain >3 lbs).   Yes Historical Provider, MD  mometasone (NASONEX) 50 MCG/ACT nasal spray Place 1 spray into both nostrils daily. allergies   Yes Historical Provider, MD  omega-3 acid ethyl esters (LOVAZA) 1 G capsule Take 2 g by mouth 2 (two) times daily.    Yes Historical Provider, MD  pantoprazole (PROTONIX) 40 MG tablet Take 40 mg by mouth 2 (two) times daily.     Yes Historical Provider, MD  potassium chloride SA (K-DUR,KLOR-CON) 20 MEQ tablet Take 2 tablets (40 mEq total) by mouth 2 (two) times daily. 09/05/15  Yes Amy D Clegg, NP  ramipril (ALTACE) 5 MG capsule Take 1 capsule (5 mg total) by mouth at bedtime. 08/22/15  Yes Amy D Clegg, NP  rivaroxaban (XARELTO) 20 MG TABS tablet Take 1 tablet (20 mg total) by mouth daily with supper. 09/22/15  Yes Thompson Grayer, MD  rOPINIRole (REQUIP) 1 MG tablet Take 1 mg by mouth at bedtime.     Yes Historical Provider, MD  spironolactone (ALDACTONE) 25 MG tablet Take 25 mg by mouth daily.   Yes Historical Provider, MD  tiotropium (SPIRIVA) 18 MCG inhalation capsule Place 18 mcg into inhaler and inhale daily.     Yes Historical Provider, MD  vitamin B-12 (CYANOCOBALAMIN) 1000 MCG tablet Take 1,000 mcg by mouth daily.   Yes Historical Provider, MD  vitamin C (ASCORBIC ACID) 500 MG tablet Take 500 mg by mouth daily.   Yes Historical Provider, MD  vitamin E 400 UNIT capsule Take 400 Units by mouth daily.   Yes Historical Provider, MD  Insulin Glargine (TOUJEO SOLOSTAR [place]) Inject 110 Units into the skin daily.     Historical Provider, MD  insulin lispro (HUMALOG) 100 UNIT/ML injection Inject 40 Units into the  skin 2 (two) times daily with a meal.     Historical Provider, MD  Insulin Pen Needle (B-D ULTRAFINE III SHORT PEN) 31G X 8 MM MISC 18/day as directed dx 250.00 08/01/11   Renato Shin, MD  nitroGLYCERIN (NITROSTAT) 0.4 MG SL tablet Place 1 tablet (0.4 mg total) under the tongue every 5 (five) minutes as needed. For chest pain 10/18/15   Jolaine Artist, MD  SUMAtriptan (IMITREX) 25 MG tablet Take 25 mg by mouth every 2 (two) hours as needed. For migraine    Historical Provider, MD    Scheduled Meds: . amiodarone  200 mg Oral Daily  . atorvastatin  40 mg Oral q1800  . busPIRone  30 mg Oral BID  . carvedilol  12.5 mg Oral BID WC  . [START ON 11/16/2015] digoxin  0.125 mg Oral QODAY  . DULoxetine  30 mg Oral Daily  . insulin aspart  0-15 Units Subcutaneous TID WC  . insulin aspart  0-5 Units Subcutaneous QHS  . insulin glargine  56 Units Subcutaneous QHS  . ivabradine  5 mg Oral BID WC  . levothyroxine  75 mcg Oral QAC breakfast  . potassium chloride SA  40 mEq Oral BID  . rOPINIRole  1 mg Oral QHS  . sodium chloride flush  3 mL Intravenous Q12H  . tiotropium  18 mcg Inhalation Daily   Infusions: . pantoprozole (PROTONIX) infusion 8 mg/hr (11/15/15 0400)   PRN Meds:    Allergies as of 11/14/2015 - Review Complete 11/14/2015  Allergen Reaction Noted  . Bee venom Anaphylaxis 03/16/2014  . Other Other (See Comments) 08/19/2015  .  Aspirin Hives   . Ciprofloxacin Hives   . Codeine Nausea Only   . Naproxen Hives   . Sulfonamide derivatives Hives   . Nsaids Itching and Rash 12/25/2014  . Tape Rash 08/11/2011    Family History  Problem Relation Age of Onset  . Diabetes Mellitus II Mother   . Coronary artery disease Father     CABG x 3  . Breast cancer Cousin   . Breast cancer Cousin   . Uterine cancer Cousin   . Lung cancer Mother   . Congestive Heart Failure Mother   . Hypertension Father   . Diabetes Mellitus II Father     Social History   Social History  .  Marital Status: Divorced    Spouse Name: N/A  . Number of Children: N/A  . Years of Education: N/A   Occupational History  . Disabled    Social History Main Topics  . Smoking status: Former Smoker    Quit date: 04/12/2001  . Smokeless tobacco: Never Used  . Alcohol Use: No  . Drug Use: No  . Sexual Activity: Not Currently   Other Topics Concern  . Not on file   Social History Narrative   Lives with mother and brother Philis Nettle          REVIEW OF SYSTEMS: Constitutional:  Per HPI ENT:  No nose bleeds Pulm:  No cough.  + DOE CV:  No palpitations, no LE edema.  GU:  No hematuria, no frequency GI:  Per HPI.  No dysphagia Heme:  Per HPI   Transfusions:  Per HPI Neuro:  No headaches, no peripheral tingling or numbness Derm:  No itching, no rash or sores.  Endocrine:  No sweats or chills.  No polyuria or dysuria Immunization:  Not queried. Travel:  None beyond local counties in last few months.    PHYSICAL EXAM: Vital signs in last 24 hours: Filed Vitals:   11/15/15 0728 11/15/15 0808  BP:  134/53  Pulse: 89 88  Temp: 97.3 F (36.3 C)   Resp:     Wt Readings from Last 3 Encounters:  11/15/15 100 kg (220 lb 7.4 oz)  10/24/15 102.241 kg (225 lb 6.4 oz)  10/03/15 100.608 kg (221 lb 12.8 oz)    General: morbidly obese, pleasant, looks old for age.  Comfortable.  Head:  No asymmetry, no edema  Eyes:  No icterus, + conj pallor Ears:  Not HOH  Nose:  No congestion or discharge Mouth:  Clear and moist. Only lower incisors remain Neck:  No mass, no JVD Lungs:  Clear bil.  No labored breathing or cough Heart: RRR.  No mrg.  S1/S2 audible Abdomen:  Obese not tender.  bil upper abdominal hernias.  BS active, no HSM, no bruits.   Rectal: deferred.  Stool dark, FOBT + in ED   Musc/Skeltl: no joint redness or swelling Extremities:  No CCE  Neurologic:  Oriented x 3, extremely detailed historian with chartings of meds.  Skin:  No telangectasia or sores.  No  rash Tattoos:  none   Psych:  Pleasant, calm.   Intake/Output from previous day: 04/11 0701 - 04/12 0700 In: 1092.1 [P.O.:440; I.V.:317.1; Blood:335] Out: 1500 [Urine:1500] Intake/Output this shift: Total I/O In: -  Out: 450 [Urine:450]  LAB RESULTS:  Recent Labs  11/14/15 1240 11/15/15 0213  WBC 12.1* 11.3*  HGB 7.7* 7.7*  HCT 25.5* 24.8*  PLT 424* 379   BMET Lab Results  Component Value Date  NA 131* 11/15/2015   NA 134* 11/14/2015   NA 138 10/24/2015   K 3.5 11/15/2015   K 3.1* 11/14/2015   K 3.6 10/24/2015   CL 92* 11/15/2015   CL 92* 11/14/2015   CL 96* 10/24/2015   CO2 28 11/15/2015   CO2 28 11/14/2015   CO2 31 10/24/2015   GLUCOSE 233* 11/15/2015   GLUCOSE 152* 11/14/2015   GLUCOSE 180* 10/24/2015   BUN 51* 11/15/2015   BUN 55* 11/14/2015   BUN 16 10/24/2015   CREATININE 1.55* 11/15/2015   CREATININE 1.58* 11/14/2015   CREATININE 0.98 10/24/2015   CALCIUM 9.9 11/15/2015   CALCIUM 10.7* 11/14/2015   CALCIUM 9.7 10/24/2015   LFT  Recent Labs  11/15/15 0213  PROT 6.7  ALBUMIN 3.1*  AST 24  ALT 20  ALKPHOS 56  BILITOT 2.5*   PT/INR Lab Results  Component Value Date   INR 1.73* 11/15/2015   INR 2.16* 11/14/2015   INR 1.16 08/19/2015   Hepatitis Panel No results for input(s): HEPBSAG, HCVAB, HEPAIGM, HEPBIGM in the last 72 hours. C-Diff No components found for: CDIFF Lipase     Component Value Date/Time   LIPASE 22 05/21/2011 1940    Drugs of Abuse  No results found for: LABOPIA, COCAINSCRNUR, LABBENZ, AMPHETMU, THCU, LABBARB   RADIOLOGY STUDIES: Dg Chest 2 View  11/14/2015  CLINICAL DATA:  Weakness and dizziness for 3 days, history CHF, hypertension, atrial fibrillation, COPD, asthma, former smoker EXAM: CHEST  2 VIEW COMPARISON:  Prior exams are not available at this time FINDINGS: LEFT subclavian transvenous AICD lead projects at RIGHT ventricle. Enlargement of cardiac silhouette. Mediastinal contours and pulmonary vascularity  normal. Atherosclerotic calcification aorta. Emphysematous changes with mild RIGHT basilar atelectasis. No gross failure or consolidation. No pleural effusion or pneumothorax. Bones demineralized. IMPRESSION: Enlargement of cardiac silhouette post AICD. Emphysematous changes with subsegmental atelectasis at RIGHT base. Electronically Signed   By: Lavonia Dana M.D.   On: 11/14/2015 13:39   Ct Head Wo Contrast  11/14/2015  CLINICAL DATA:  Generalized weakness and blurry vision EXAM: CT HEAD WITHOUT CONTRAST TECHNIQUE: Contiguous axial images were obtained from the base of the skull through the vertex without intravenous contrast. COMPARISON:  None. FINDINGS: There is no evidence of mass effect, midline shift or extra-axial fluid collections. There is no evidence of a space-occupying lesion or intracranial hemorrhage. There is no evidence of a cortical-based area of acute infarction. The ventricles and sulci are appropriate for the patient's age. The basal cisterns are patent. Visualized portions of the orbits are unremarkable. The visualized portions of the paranasal sinuses and mastoid air cells are unremarkable. The osseous structures are unremarkable. IMPRESSION: No acute intracranial pathology. Electronically Signed   By: Kathreen Devoid   On: 11/14/2015 13:57    ENDOSCOPIC STUDIES: Per HPI  IMPRESSION:   *  Anemia arising since pt started Xarelto 09/2015.  Black stools FOBT + PRBC x 1.  The repeat CBC was obtained right after transfusion completed, hopefully next Hgb will reflect improvement.  Doubt PUD as on PPI BID, no ASA/NSAIDs.  ? AVMs/neoplasia/portal htn related bleeding?    *  Afib.  09/2015 started Xarelto, now on hold: last dose 4/9.  coags are elevated, new since 08/2015.   *  CHF, EF 20%.  S/p ICD.  She is not currently having CHF flare clinically or by xray. .    *  OSA, on CPAP.   *  Fatty liver per CT of 2008.  Possibility of her having advanced to cirrhosis but LFTs are normal.     *  CKD, AKI.  GFR 35.        PLAN:     *  egd tomorrow 1415.  Eat today. Pt informed of plans and is eager to proceed.  Note MDs not wanting to rush into another PRBC transfusion for fear of exacerbating CHF, so I will check Hgb this afternoon.  CBC in AM.      Azucena Freed  11/15/2015, 8:10 AM Pager: [phone]

## 2015-11-16 NOTE — Transfer of Care (Signed)
Immediate Anesthesia Transfer of Care Note  Patient: [name]  Procedure(s) Performed: Procedure(s): ESOPHAGOGASTRODUODENOSCOPY (EGD) (N/A)  Patient Location: Endoscopy Unit  Anesthesia Type:MAC  Level of Consciousness: awake, alert  and oriented  Airway & Oxygen Therapy: Patient Spontanous Breathing and Patient connected to nasal cannula oxygen  Post-op Assessment: Report given to RN and Post -op Vital signs reviewed and stable  Post vital signs: Reviewed and stable  Last Vitals:  Filed Vitals:   11/16/15 1404 11/16/15 1514  BP: 106/50 97/41  Pulse: 75 74  Temp:    Resp: 17 13    Complications: No apparent anesthesia complications

## 2015-11-16 NOTE — Anesthesia Preprocedure Evaluation (Addendum)
Anesthesia Evaluation  Patient identified by MRN, date of birth, ID band Patient awake    Reviewed: Allergy & Precautions, NPO status , Patient's Chart, lab work & pertinent test results  Airway Mallampati: II  TM Distance: >3 FB Neck ROM: Full    Dental  (+) Edentulous Upper   Pulmonary former smoker,    breath sounds clear to auscultation       Cardiovascular hypertension,  Rhythm:Regular Rate:Normal     Neuro/Psych    GI/Hepatic   Endo/Other  diabetes  Renal/GU      Musculoskeletal   Abdominal   Peds  Hematology   Anesthesia Other Findings   Reproductive/Obstetrics                            Anesthesia Physical Anesthesia Plan  ASA: III  Anesthesia Plan: MAC   Post-op Pain Management:    Induction: Intravenous  Airway Management Planned: Natural Airway and Simple Face Mask  Additional Equipment:   Intra-op Plan:   Post-operative Plan:   Informed Consent: I have reviewed the patients History and Physical, chart, labs and discussed the procedure including the risks, benefits and alternatives for the proposed anesthesia with the patient or authorized representative who has indicated his/her understanding and acceptance.   Dental advisory given  Plan Discussed with: CRNA and Anesthesiologist  Anesthesia Plan Comments:         Anesthesia Quick Evaluation

## 2015-11-16 NOTE — Op Note (Signed)
[facility] Patient Name: [name] Procedure Date : 11/16/2015 MRN: [id number] Attending MD: Carlota Raspberry. Noe Goyer MD, MD Date of Birth: 1956/03/16 CSN: [id number] Age: 60 Admit Type: Inpatient Procedure:                Upper GI endoscopy Indications:              Iron deficiency anemia secondary to chronic blood                            loss, Melena Providers:                Carlota Raspberry. Aunesty Tyson MD, MD, Malka So, RN,                            Cletis [place], Technician Referring MD:              Medicines:                Monitored Anesthesia Care Complications:            No immediate complications. Estimated blood loss:                            None. Estimated Blood Loss:     Estimated blood loss: none. Procedure:                Pre-Anesthesia Assessment:                           - Prior to the procedure, a History and Physical                            was performed, and patient medications and                            allergies were reviewed. The patient's tolerance of                            previous anesthesia was also reviewed. The risks                            and benefits of the procedure and the sedation                            options and risks were discussed with the patient.                            All questions were answered, and informed consent                            was obtained. Prior Anticoagulants: The patient has                            taken Xarelto (rivaroxaban), last dose was 3 days  prior to procedure. ASA Grade Assessment: III - A                            patient with severe systemic disease. After                            reviewing the risks and benefits, the patient was                            deemed in satisfactory condition to undergo the                            procedure.                           After obtaining informed consent, the endoscope was                             passed under direct vision. Throughout the                            procedure, the patient's blood pressure, pulse, and                            oxygen saturations were monitored continuously. The                            [id number]) scope was introduced through the                            mouth, and advanced to the second part of duodenum.                            The upper GI endoscopy was accomplished without                            difficulty. The patient tolerated the procedure                            well. Scope In: Scope Out: Findings:      Esophagogastric landmarks were identified: the Z-line was found at 40       cm, the gastroesophageal junction was found at 40 cm and the upper       extent of the gastric folds was found at 40 cm from the incisors.      The exam of the esophagus was otherwise normal.      Multiple small sessile polyps were found in the gastric fundus and in       the gastric body. These were not removed on this exam.      The exam of the stomach was otherwise normal.      Two 4 mm sessile polyps were found in the duodenal bulb. Suspect       adenomas but not removed given possible capsule endoscopy.      The exam of the duodenum was otherwise normal. Impression:               -  Esophagogastric landmarks identified. Normal                            esophagus                           - Multiple gastric polyps, suspect benign fundic                            gland polyps.                           - Two duodenal polyps, could be adenomatous, not                            removed on this exam due to potential need for                            capsule study. These will need to be removed at a                            later date.                           Overall, no clear pathology noted to cause the                            patient's melena and anemia. Moderate Sedation:      no moderate sedation Recommendation:            - Return patient to hospital ward for ongoing care.                           - Clear liquid diet.                           - Continue present medications.                           - Recommend bowel prep this evening in preparation                            for possible colonoscopy versus capsule endoscopy                            tomorrow. Dr. Hilarie Fredrickson is assuming the service                            tomorrow and I will discuss this case with him.                            Given her relatively recent colonoscopy, capsule                            may be of higher yield, suspect small bowel vs.  right colon as the cause.                           - Continue to hold Xarelto at this time                           - Serial H/H, monitor for recurrence of bleeding                           - Once through this admission, the patient can                            follow up with me as an outpatient to repeat EGD                            and remove duodenal polyps Procedure Code(s):        --- Professional ---                           [phone], Esophagogastroduodenoscopy, flexible,                            transoral; diagnostic, including collection of                            specimen(s) by brushing or washing, when performed                            (separate procedure) Diagnosis Code(s):        --- Professional ---                           K31.7, Polyp of stomach and duodenum                           D50.0, Iron deficiency anemia secondary to blood                            loss (chronic)                           K92.1, Melena (includes Hematochezia) CPT copyright 2016 [employer]. All rights reserved. The codes documented in this report are preliminary and upon coder review may  be revised to meet current compliance requirements. Remo Lipps P. Jasiel Apachito MD, MD 11/16/2015 3:17:56 PM This report has been signed  electronically. Number of Addenda: 0

## 2015-11-16 NOTE — Progress Notes (Signed)
Family Medicine Teaching Service Daily Progress Note Intern Pager: [phone]  Patient name: [name] Medical record number: [id number] Date of birth: 11-24-1955 Age: 60 y.o. Gender: female  Primary Care Provider: Nicoletta Dress, MD Consultants: GI, Card Code Status: DNR  Pt Overview and Major Events to Date:  04/11 admitted with symptomatic anemia and transfused one unit of blood  Assessment and Plan: HIEU KRISH is a 60 y.o. female presenting with sudden blurry vision and gradually worsening shortness of breath . 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  Symptomatic IDA: improved. Hgb 7.3 this morning. 7.7 on admission and after 1 unit (11.6 about three months ago). Likely from GI bleeding exacerbated by recent start of Xarelto for Afib. Good bone marrow response. Dyspnea and weakness better this morning. Denies bleeding. Had three BM's. All looked dark. No bright blood  -Typed and crossed on 4/11 -s/p one unit pRBC. Hgb 7.7> 1u > 7.7>>7.3 -Will give another unit and IV iron after that -GI consult - appreciate recs -EGD this morning - Cardiac monitoring  Nonanginal chest pain: nonanginal in nature. EKG this morning with LBBB but QRS only 0.12. Still with LAD and 1sr degree AVB. Otherwise SR.Troponin neg x3.  -appreciate card recs  -hold Xarelto pending GI blessing  -signed off  AFib: recent diagnosis. She is in SR now. CHAD-Vasc score 5. HAS-BLED score 2 (1.88 bleeds per 100 patient-years). On amiodarone and Xarelto at home -Hold Xarelto in the setting of bleeding -Continue Amiodrone, BB, and digoxin -Appreciate recs  HFrEF: Echo 08/2015 with EF 30-35%, diffuse hypokinesis.  Chemo induced cardiomyopathy s/p St. Jude's ICD in 2014. On coreg, digoxin, Ivabradine, spironolactone and ramipril, lasix and metolazone. No sign of fluid overload this morning.  -Hold the spironolactone, metalozone and ACEi in the setting of AKI and tenuous BP with GIB -Continue other home meds as BP tolerates -daily weights & Is&Os. UOP 1.4L, net +300/-100cc. Weight down by 3lbs  Hypertension: hypotensive. -continue meds as above -Consider decreasing coreg to 6.25 mg twice a day if continues to be hypotensive.  AKI: Cr 1.58>1.55. Baseline about 0.9. Likely prerenal in setting of volume contraction from anemia 2/2 GIB. -Hold nephrotoxic meds -encourage by mouth hydration - hold IVF in setting of HFrEF -f/u BMP  Hypokalemia: resolved. K 3.1>3.5 -Replete with KDur 87mEq x1 now -f/u BMP  DM-2: on Humalog 75/25 60 units bid and Humalog 40 units twice a day with meals. A1c 7.3. CBGs in 200's. Received 56 u of Lantus and 15 units of novolog -increase Lantus to 60 today once she start eating -moderate SSI  Hyperlipidemia: on atorvastatin -Continue home atorvastatin  OSA: on CPAP at home -Will continue CPAP qhs here  Restless leg syndrome: -continue home Ropinirole  Hypothyroidism: TSH 3.8 in 10/2015. On synthroid 75 mcg -continue home synthroid  Anxiety: stable. On buspirone at home -Continue home buspirone  FEN/GI:  -Card modified and heart health diet after scope - SLIV  Prophylaxis: SCD in the setting of GI bleed  Disposition:transfer to floor.   Subjective:  Patient sitting on bed. Reports her breathing and fatigue is better. Had three BM. All looked dark. No bright blood in stool. No chest pain.  Objective: Temp:  [97.3 F (36.3 C)-97.9  F (36.6 C)] 97.7 F (36.5 C) (04/13 0358) Pulse Rate:  [76-90] 77 (04/13 0358) Resp:  [12-25] 14 (04/13 0358) BP: (103-134)/(38-65) 104/45 mmHg (04/13 0358) SpO2:  [97 %-100 %] 100 % (04/13 0358) Weight:  [217 lb 9.5 oz (98.7  kg)] 217 lb 9.5 oz (98.7 kg) (04/13 0511) Physical Exam: Gen: appears well, sitting on bed without distress Eyes: conjunctival palor. Blurry vision resolved. PERRL, EOMI Oropharynx: clear, moist Neck: supple, no LAD CV: regular rate and rythm. S1 & S2 audible, no murmurs, no edema. Resp: no apparent work of breathing, clear to auscultation bilaterally. GI: bowel sounds normal, no tenderness to palpation. Abdominal hernia bilaterally GU: no suprapubic tenderness Skin: no lesion/rash Neuro: AO x4. No gross deficit Psych: euthymic, appropriate affect  Laboratory:  Recent Labs Lab 11/15/15 0213 11/15/15 1500 11/16/15 0339  WBC 11.3* 9.4 8.0  HGB 7.7* 7.9* 7.3*  HCT 24.8* 25.3* 23.7*  PLT 379 396 344    Recent Labs Lab 11/14/15 1240 11/15/15 0213  NA 134* 131*  K 3.1* 3.5  CL 92* 92*  CO2 28 28  BUN 55* 51*  CREATININE 1.58* 1.55*  CALCIUM 10.7* 9.9  PROT  --  6.7  BILITOT  --  2.5*  ALKPHOS  --  56  ALT  --  20  AST  --  24  GLUCOSE 152* 233*    Imaging/Diagnostic Tests: No results found.  Mercy Riding, MD 11/16/2015, 6:47 AM PGY-1, [place] Intern pager: [phone], text pages welcome

## 2015-11-16 NOTE — Anesthesia Postprocedure Evaluation (Signed)
Anesthesia Post Note  Patient: [name]  Procedure(s) Performed: Procedure(s) (LRB): ESOPHAGOGASTRODUODENOSCOPY (EGD) (N/A)  Patient location during evaluation: PACU Anesthesia Type: General Level of consciousness: awake and awake and alert Pain management: pain level controlled Vital Signs Assessment: post-procedure vital signs reviewed and stable Respiratory status: nonlabored ventilation, spontaneous breathing and respiratory function stable Cardiovascular status: blood pressure returned to baseline Anesthetic complications: no    Last Vitals:  Filed Vitals:   11/16/15 1530 11/16/15 1600  BP: 105/39 124/59  Pulse: 73   Temp:  36.9 C  Resp: 16 19    Last Pain:  Filed Vitals:   11/16/15 1648  PainSc: Asleep                 Tibor Lemmons COKER

## 2015-11-16 NOTE — Interval H&P Note (Signed)
History and Physical Interval Note:  11/16/2015 2:39 PM  [name]  has presented today for surgery, with the diagnosis of anemia, FOBT +  The various methods of treatment have been discussed with the patient and family. After consideration of risks, benefits and other options for treatment, the patient has consented to  Procedure(s): ESOPHAGOGASTRODUODENOSCOPY (EGD) (N/A) as a surgical intervention .  The patient's history has been reviewed, patient examined, no change in status, stable for surgery.  I have reviewed the patient's chart and labs.  Questions were answered to the patient's satisfaction.     Renelda Loma Calyx Hawker

## 2015-11-16 NOTE — Progress Notes (Signed)
Pt wants to wear cpap this morning d/t feeling tired.

## 2015-11-17 ENCOUNTER — Inpatient Hospital Stay (HOSPITAL_COMMUNITY): Payer: Medicare Other | Admitting: Anesthesiology

## 2015-11-17 ENCOUNTER — Encounter (HOSPITAL_COMMUNITY): Payer: Self-pay

## 2015-11-17 ENCOUNTER — Encounter (HOSPITAL_COMMUNITY)
Admission: EM | Disposition: A | Discharge status: Home / Self Care | Payer: Self-pay | Source: Home / Self Care | Attending: Family Medicine

## 2015-11-17 DIAGNOSIS — D124 Benign neoplasm of descending colon: Secondary | ICD-10-CM | POA: Insufficient documentation

## 2015-11-17 DIAGNOSIS — D500 Iron deficiency anemia secondary to blood loss (chronic): Secondary | ICD-10-CM

## 2015-11-17 DIAGNOSIS — K921 Melena: Principal | ICD-10-CM

## 2015-11-17 HISTORY — PX: COLONOSCOPY: SHX5424

## 2015-11-17 LAB — CBC
HCT: 26.7 % — ABNORMAL LOW (ref 36.0–46.0)
HEMATOCRIT: 25.2 % — AB (ref 36.0–46.0)
Hemoglobin: 7.9 g/dL — ABNORMAL LOW (ref 12.0–15.0)
Hemoglobin: 8.3 g/dL — ABNORMAL LOW (ref 12.0–15.0)
MCH: 27.3 pg (ref 26.0–34.0)
MCH: 27.1 pg (ref 26.0–34.0)
MCHC: 31.3 g/dL (ref 30.0–36.0)
MCHC: 31.1 g/dL (ref 30.0–36.0)
MCV: 87.2 fL (ref 78.0–100.0)
MCV: 87.3 fL (ref 78.0–100.0)
PLATELETS: 307 10*3/uL (ref 150–400)
PLATELETS: 335 10*3/uL (ref 150–400)
RBC: 2.89 MIL/uL — ABNORMAL LOW (ref 3.87–5.11)
RBC: 3.06 MIL/uL — AB (ref 3.87–5.11)
RDW: 16.6 % — AB (ref 11.5–15.5)
RDW: 16.8 % — AB (ref 11.5–15.5)
WBC: 8.2 10*3/uL (ref 4.0–10.5)
WBC: 9.5 10*3/uL (ref 4.0–10.5)

## 2015-11-17 LAB — GLUCOSE, CAPILLARY
GLUCOSE-CAPILLARY: 201 mg/dL — AB (ref 65–99)
Glucose-Capillary: 203 mg/dL — ABNORMAL HIGH (ref 65–99)
Glucose-Capillary: 207 mg/dL — ABNORMAL HIGH (ref 65–99)
Glucose-Capillary: 227 mg/dL — ABNORMAL HIGH (ref 65–99)

## 2015-11-17 SURGERY — COLONOSCOPY
Anesthesia: Monitor Anesthesia Care

## 2015-11-17 MED ORDER — SODIUM CHLORIDE 0.9 % IV SOLN
1000.0000 mg | Freq: Once | INTRAVENOUS | Status: AC
  Administered 2015-11-17: 1000 mg via INTRAVENOUS
  Filled 2015-11-17 (×2): qty 20

## 2015-11-17 MED ORDER — LACTATED RINGERS IV SOLN
INTRAVENOUS | Status: DC | PRN
  Administered 2015-11-17: 12:00:00 via INTRAVENOUS

## 2015-11-17 MED ORDER — INSULIN GLARGINE 100 UNIT/ML ~~LOC~~ SOLN
70.0000 [IU] | Freq: Every day | SUBCUTANEOUS | Status: DC
  Administered 2015-11-17: 70 [IU] via SUBCUTANEOUS
  Filled 2015-11-17 (×3): qty 0.7

## 2015-11-17 MED ORDER — PROPOFOL 500 MG/50ML IV EMUL
INTRAVENOUS | Status: DC | PRN
  Administered 2015-11-17: 75 ug/kg/min via INTRAVENOUS

## 2015-11-17 MED ORDER — SODIUM CHLORIDE 0.9 % IV SOLN
500.0000 mg | Freq: Once | INTRAVENOUS | Status: AC
  Administered 2015-11-18: 500 mg via INTRAVENOUS
  Filled 2015-11-17 (×2): qty 10

## 2015-11-17 MED ORDER — PHENYLEPHRINE HCL 10 MG/ML IJ SOLN
INTRAMUSCULAR | Status: DC | PRN
  Administered 2015-11-17 (×2): 80 ug via INTRAVENOUS

## 2015-11-17 MED ORDER — PROPOFOL 10 MG/ML IV BOLUS
INTRAVENOUS | Status: DC | PRN
  Administered 2015-11-17 (×2): 10 mg via INTRAVENOUS
  Administered 2015-11-17: 15 mg via INTRAVENOUS
  Administered 2015-11-17: 50 mg via INTRAVENOUS

## 2015-11-17 MED ORDER — SODIUM CHLORIDE 0.9 % IV SOLN
25.0000 mg | Freq: Once | INTRAVENOUS | Status: AC
  Administered 2015-11-17: 25 mg via INTRAVENOUS
  Filled 2015-11-17: qty 0.5

## 2015-11-17 MED ORDER — PHENYLEPHRINE HCL 10 MG/ML IJ SOLN
10.0000 mg | INTRAVENOUS | Status: DC | PRN
  Administered 2015-11-17: 30 ug/min via INTRAVENOUS

## 2015-11-17 NOTE — Progress Notes (Signed)
Pt was already on NIV on my arrival. Pt is stable at this time tolerating it well. O2 humidity sterile water provided.

## 2015-11-17 NOTE — Anesthesia Postprocedure Evaluation (Signed)
Anesthesia Post Note  Patient: [name]  Procedure(s) Performed: Procedure(s) (LRB): COLONOSCOPY (N/A)  Patient location during evaluation: Endoscopy Anesthesia Type: MAC Level of consciousness: awake and alert Pain management: pain level controlled Vital Signs Assessment: post-procedure vital signs reviewed and stable Respiratory status: spontaneous breathing, nonlabored ventilation and respiratory function stable Cardiovascular status: blood pressure returned to baseline and stable Postop Assessment: no signs of nausea or vomiting Anesthetic complications: no    Last Vitals:  Filed Vitals:   11/17/15 1343 11/17/15 1359  BP:  113/75  Pulse: 74 86  Temp:    Resp: 17 16    Last Pain:  Filed Vitals:   11/17/15 1403  PainSc: 0-No pain                 Meda Dudzinski,E. Avika Carbine

## 2015-11-17 NOTE — Care Management Important Message (Signed)
Important Message  Patient Details  Name: [name] MRN: [id number] Date of Birth: 1955-08-23   Medicare Important Message Given:  Yes    Nathen May 11/17/2015, 10:58 AM

## 2015-11-17 NOTE — Op Note (Signed)
[facility] Patient Name: [name] Procedure Date : 11/17/2015 MRN: [id number] Attending MD: Jerene Bears , MD Date of Birth: 10/30/1955 CSN: [id number] Age: 60 Admit Type: Inpatient Procedure:                Colonoscopy Indications:              Melena, Iron deficiency anemia Providers:                Lajuan Lines. Hilarie Fredrickson, MD, Laverta Baltimore, RN, Despina Pole, Technician Referring MD:              Medicines:                Monitored Anesthesia Care Complications:            No immediate complications. Estimated Blood Loss:     Estimated blood loss was minimal. Procedure:                Pre-Anesthesia Assessment:                           - Prior to the procedure, a History and Physical                            was performed, and patient medications and                            allergies were reviewed. The patient's tolerance of                            previous anesthesia was also reviewed. The risks                            and benefits of the procedure and the sedation                            options and risks were discussed with the patient.                            All questions were answered, and informed consent                            was obtained. Prior Anticoagulants: The patient has                            taken Xarelto (rivaroxaban), last dose was 4 days                            prior to procedure. ASA Grade Assessment: III - A                            patient with severe systemic disease. After  reviewing the risks and benefits, the patient was                            deemed in satisfactory condition to undergo the                            procedure.                           After obtaining informed consent, the colonoscope                            was passed under direct vision. Throughout the                            procedure, the patient's blood pressure, pulse, and                             oxygen saturations were monitored continuously. The                            EC-[id number] scope was introduced through                            the anus and advanced to the the cecum, identified                            by appendiceal orifice and ileocecal valve. The                            colonoscopy was performed without difficulty. The                            patient tolerated the procedure well. The quality                            of the bowel preparation was good. The ileocecal                            valve, appendiceal orifice, and rectum were                            photographed. Scope In: 12:42:42 PM Scope Out: 1:09:28 PM Scope Withdrawal Time: 0 hours 14 minutes 16 seconds  Total Procedure Duration: 0 hours 26 minutes 46 seconds  Findings:      The perianal exam findings include skin tags.      A 5 mm polyp was found in the proximal descending colon. The polyp was       sessile. The polyp was removed with a cold snare. Resection and       retrieval were complete.      Many small and large-mouthed diverticula were found in the left colon.      An area of mild melanosis was found in the ascending colon and in the       cecum.  There is no endoscopic evidence of bleeding, inflammation or       angioectasia in the entire colon.      The retroflexed view of the distal rectum and anal verge was normal and       showed no anal or rectal abnormalities. Impression:               - One 5 mm polyp in the proximal descending colon,                            removed with a cold snare. Resected and retrieved.                           - Moderate diverticulosis in the left colon.                           - Melanosis in the colon.                           - The distal rectum and anal verge are normal on                            retroflexion view. Moderate Sedation:      N/A Recommendation:           - Resume previous diet.                            - Continue present medications, though would hold                            Xarelto pending further evaluation of recent GI                            bleed and iron deficiency. Okay for daily aspirin                            given history of atrial fibrillation.                           - Await pathology results.                           - Repeat colonoscopy is recommended for                            surveillance, likely 5 years. The colonoscopy date                            will be determined after pathology results from                            today's exam become available for review.                           - Return to GI clinic for follow-up with Dr.  Armbruster.                           - To visualize the small bowel, perform video                            capsule endoscopy as outpatient ([facility] to                            arrange).                           - Replace iron (IV dose while here) then PO at                            discharge Procedure Code(s):        --- Professional ---                           [phone], Colonoscopy, flexible; with removal of                            tumor(s), polyp(s), or other lesion(s) by snare                            technique Diagnosis Code(s):        --- Professional ---                           D12.4, Benign neoplasm of descending colon                           K63.89, Other specified diseases of intestine                           K64.4, Residual hemorrhoidal skin tags                           K92.1, Melena (includes Hematochezia)                           D50.9, Iron deficiency anemia, unspecified                           K57.30, Diverticulosis of large intestine without                            perforation or abscess without bleeding CPT copyright 2016 American Medical Association. All rights reserved. The codes documented in this report are preliminary and  upon coder review may  be revised to meet current compliance requirements. Jerene Bears, MD 11/17/2015 1:25:01 PM This report has been signed electronically. Number of Addenda: 0

## 2015-11-17 NOTE — Progress Notes (Signed)
          Daily Rounding Note  11/17/2015, 8:06 AM  LOS: 3 days   SUBJECTIVE:       Tolerated bowel prep No further melena Hemoglobin slightly lower today Denies pain, shortness of breath or chest pain Last Xarelto was 11/13/2015  OBJECTIVE:         Vital signs in last 24 hours:    Temp:  [97.5 F (36.4 C)-98.9 F (37.2 C)] 97.8 F (36.6 C) (04/14 0800) Pulse Rate:  [71-88] 82 (04/14 0448) Resp:  [13-26] 26 (04/14 0800) BP: (94-127)/(38-59) 108/51 mmHg (04/14 0800) SpO2:  [99 %-100 %] 99 % (04/14 0800) Weight:  [95.8 kg (211 lb 3.2 oz)] 95.8 kg (211 lb 3.2 oz) (04/14 0448) Last BM Date: 11/16/15 Filed Weights   11/15/15 0500 11/16/15 0511 11/17/15 0448  Weight: 100 kg (220 lb 7.4 oz) 98.7 kg (217 lb 9.5 oz) 95.8 kg (211 lb 3.2 oz)   Gen: awake, alert, NAD HEENT: anicteric, op clear CV: RRR, no mrg Pulm: CTA b/l Abd: soft, obese, NT/ND, +BS throughout Ext: no c/c/e Neuro: nonfocal   Intake/Output from previous day: 04/13 0701 - 04/14 0700 In: 920 [P.O.:400; I.V.:200; Blood:320] Out: 0   Intake/Output this shift:    Lab Results:  Recent Labs  11/15/15 1500 11/16/15 0339 11/16/15 2235 11/17/15 0231  WBC 9.4 8.0  --  8.2  HGB 7.9* 7.3* 8.7* 7.9*  HCT 25.3* 23.7* 28.2* 25.2*  PLT 396 344  --  307   BMET  Recent Labs  11/14/15 1240 11/15/15 0213 11/16/15 0903  NA 134* 131* 137  K 3.1* 3.5 3.8  CL 92* 92* 97*  CO2 28 28 29   GLUCOSE 152* 233* 262*  BUN 55* 51* 27*  CREATININE 1.58* 1.55* 1.08*  CALCIUM 10.7* 9.9 9.8   LFT  Recent Labs  11/15/15 0213  PROT 6.7  ALBUMIN 3.1*  AST 24  ALT 20  ALKPHOS 56  BILITOT 2.5*   PT/INR  Recent Labs  11/14/15 1240 11/15/15 0213  LABPROT 23.9* 20.3*  INR 2.16* 1.73*     ASSESMENT:   *  Anemia.  Melena. Iron def. 11/16/2015 EGD: gastric polyps (likely benign fundic gland polyps).  Duodenal polyps. None removed.    PLAN  -- Colonoscopy  today with MAC. The nature of the procedure, as well as the risks, benefits, and alternatives were carefully and thoroughly reviewed with the patient. Ample time for discussion and questions allowed. The patient understood, was satisfied, and agreed to proceed.      Azucena Freed  11/17/2015, 8:06 AM Pager: [phone]

## 2015-11-17 NOTE — Progress Notes (Addendum)
Test dose of iron dextran has been given. Will continue to monitor for reactions for the next hour.  Patient complained of itchy skin which she said has happened before with other medications. Pharmacy was notified. Advised that it may not be from the iron infusion. Stated that the 1800 dose should be safe to give. Hydroxyzine was given for her itchy skin. Will continue to monitor.

## 2015-11-17 NOTE — Progress Notes (Signed)
MEDICATION RELATED CONSULT NOTE - INITIAL   Pharmacy Consult for IV iron Indication: anemia  60yo admitted with blurry vision. Xarelto for AFib pta. (+)melena/hematochezia and symptomatic anemia.  For colonoscopy today, found moderate diverticulitis in left colon, melanosis in colon. Symptomatic anemia noted, continuing to hold xarelto, give IV iron while in hospital.  Calculated deficiency of 1.5g of iron will split over two days with test dose now.    Allergies  Allergen Reactions  . Bee Venom Anaphylaxis  . Other Other (See Comments)    ANY TYPES OF METAL-CAUSES BLISTERS SKIN AND ITCHING   . Aspirin Hives  . Ciprofloxacin Hives  . Codeine Nausea Only    "can take ONLY if she eats with med"  . Naproxen Hives  . Sulfonamide Derivatives Hives  . Nsaids Itching and Rash  . Tape Rash    Also allergic to metal    Patient Measurements: Weight: 211 lb 3.2 oz (95.8 kg)   Vital Signs: Temp: 97.7 F (36.5 C) (04/14 1320) Temp Source: Oral (04/14 1320) BP: 113/75 mmHg (04/14 1359) Pulse Rate: 86 (04/14 1359) Intake/Output from previous day: 04/13 0701 - 04/14 0700 In: 920 [P.O.:400; I.V.:200; Blood:320] Out: 0  Intake/Output from this shift: Total I/O In: 543 [P.O.:240; I.V.:303] Out: -   Labs:  Recent Labs  11/15/15 0213 11/15/15 1500 11/16/15 0339 11/16/15 0903 11/16/15 2235 11/17/15 0231  WBC 11.3* 9.4 8.0  --   --  8.2  HGB 7.7* 7.9* 7.3*  --  8.7* 7.9*  HCT 24.8* 25.3* 23.7*  --  28.2* 25.2*  PLT 379 396 344  --   --  307  CREATININE 1.55*  --   --  1.08*  --   --   ALBUMIN 3.1*  --   --   --   --   --   PROT 6.7  --   --   --   --   --   AST 24  --   --   --   --   --   ALT 20  --   --   --   --   --   ALKPHOS 56  --   --   --   --   --   BILITOT 2.5*  --   --   --   --   --    Estimated Creatinine Clearance: 63.4 mL/min (by C-G formula based on Cr of 1.08).   Microbiology: Recent Results (from the past 720 hour(s))  MRSA PCR Screening      Status: None   Collection Time: 11/14/15  5:49 PM  Result Value Ref Range Status   MRSA by PCR NEGATIVE NEGATIVE Final    Comment:        The GeneXpert MRSA Assay (FDA approved for NASAL specimens only), is one component of a comprehensive MRSA colonization surveillance program. It is not intended to diagnose MRSA infection nor to guide or monitor treatment for MRSA infections.     Medical History: Past Medical History  Diagnosis Date  . 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 DYSLIPIDEMIA 05/01/2009  . OBESITY 07/24/2007  . OBSTRUCTIVE SLEEP APNEA 07/24/2007    with CPAP compliance  . RESTLESS LEG SYNDROME 07/24/2007  . SYSTOLIC HEART FAILURE, CHRONIC 01/05/2009    a. chemo induced cardiomyopathy b. cMRI (02/2009) EF 45% c. Myoview 05/2010: EF 59%, no ischemia d. RHC (01/2012) RA 20, RV 55/13/22, PA 56/34 (44), PCWP 28, Fick CO/CI: 3.1 / 1.5, PVR 5.3 WU, PA  sat 42% & 48% e. ECHO (10/2012) EF 20-25%, diff HK, MV calcified annulus f. ECHO (03/2014) EF 30-35%, sev HK, inferior/inferoseptal walls, mild MR  . 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 Hypertension   . History of breast cancer 2006    with return in 2008, s/p mastectomy, XRT and chemotherapy  . Osteoarthritis   . Anxiety   . Depression     followed by a psychiatrist  . Asthma   . GERD (gastroesophageal reflux disease)   . Gastroparesis   . IBS (irritable bowel syndrome)   . Myocardial infarction ([place])   . ICD (implantable cardiac defibrillator) in place 11/10/2012    ICD (11/2012)  . COPD (chronic obstructive pulmonary disease) ([place])   . Persistent atrial fibrillation ([place]) 09/2014  . AICD (automatic cardioverter/defibrillator) present   . Presence of permanent cardiac pacemaker     Erin Hearing PharmD., BCPS Clinical Pharmacist Pager [phone] 11/17/2015 2:14 PM

## 2015-11-17 NOTE — Transfer of Care (Signed)
Immediate Anesthesia Transfer of Care Note  Patient: [name]  Procedure(s) Performed: Procedure(s): COLONOSCOPY (N/A)  Patient Location: Endoscopy Unit  Anesthesia Type:MAC  Level of Consciousness: sedated  Airway & Oxygen Therapy: Patient Spontanous Breathing and Patient connected to face mask oxygen  Post-op Assessment: Report given to RN, Post -op Vital signs reviewed and stable and Patient moving all extremities  Post vital signs: Reviewed and stable  Last Vitals:  Filed Vitals:   11/17/15 0800 11/17/15 1136  BP: 108/51 130/46  Pulse:    Temp: 36.6 C 36.4 C  Resp: 26 20    Complications: No apparent anesthesia complications

## 2015-11-17 NOTE — Anesthesia Preprocedure Evaluation (Signed)
Anesthesia Evaluation  Patient identified by MRN, date of birth, ID band Patient awake    Reviewed: Allergy & Precautions, NPO status , Patient's Chart, lab work & pertinent test results  Airway Mallampati: II  TM Distance: >3 FB Neck ROM: Full    Dental  (+) Edentulous Upper   Pulmonary sleep apnea , COPD, former smoker,    breath sounds clear to auscultation       Cardiovascular hypertension, + Past MI and +CHF  + pacemaker + Cardiac Defibrillator  Rhythm:Regular Rate:Normal  08/2015 Echo: Left ventricle: The cavity size was moderately dilated. Wall  thickness was normal. Systolic function was moderately to  severely reduced. The estimated ejection fraction was in the  range of 30% to 35%. Diffuse hypokinesis. Doppler parameters are  consistent with restrictive physiology, indicative of decreased  left ventricular diastolic compliance and/or increased left  atrial pressure. Doppler parameters are consistent with high  ventricular filling pressure. - Mitral valve: Calcified annulus. Mildly thickened leaflets .  There was mild regurgitation.   Neuro/Psych    GI/Hepatic Neg liver ROS, GERD  ,  Endo/Other  diabetesHypothyroidism   Renal/GU negative Renal ROS     Musculoskeletal  (+) Arthritis ,   Abdominal   Peds  Hematology  (+) anemia ,   Anesthesia Other Findings   Reproductive/Obstetrics                             Lab Results  Component Value Date   WBC 8.2 11/17/2015   HGB 7.9* 11/17/2015   HCT 25.2* 11/17/2015   MCV 87.2 11/17/2015   PLT 307 11/17/2015   Lab Results  Component Value Date   CREATININE 1.08* 11/16/2015   BUN 27* 11/16/2015   NA 137 11/16/2015   K 3.8 11/16/2015   CL 97* 11/16/2015   CO2 29 11/16/2015   Lab Results  Component Value Date   INR 1.73* 11/15/2015   INR 2.16* 11/14/2015   INR 1.16 08/19/2015    Anesthesia Physical  Anesthesia  Plan  ASA: III  Anesthesia Plan: MAC   Post-op Pain Management:    Induction: Intravenous  Airway Management Planned: Natural Airway and Simple Face Mask  Additional Equipment:   Intra-op Plan:   Post-operative Plan:   Informed Consent: I have reviewed the patients History and Physical, chart, labs and discussed the procedure including the risks, benefits and alternatives for the proposed anesthesia with the patient or authorized representative who has indicated his/her understanding and acceptance.   Dental advisory given  Plan Discussed with: CRNA and Anesthesiologist  Anesthesia Plan Comments:         Anesthesia Quick Evaluation

## 2015-11-17 NOTE — Progress Notes (Signed)
Family Medicine Teaching Service Daily Progress Note Intern Pager: [phone]  Patient name: [name] Medical record number: [id number] Date of birth: Nov 21, 1955 Age: 60 y.o. Gender: female  Primary Care Provider: Nicoletta Dress, MD Consultants: GI, Card Code Status: DNR  Pt Overview and Major Events to Date:  04/11 admitted with symptomatic anemia and transfused one unit of blood  Assessment and Plan: JONET HAUSLER is a 60 y.o. female presenting with sudden blurry vision and gradually worsening shortness of breath . 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  Symptomatic IDA: improved. Hgb 7.7>1u>7.7>7.3>1u>>8.7>7.9 (11.6 about three months ago). Likely from GI bleeding exacerbated by recent start of Xarelto for Afib. EGD with gastric and duodenal polyps. Good bone marrow response. No dyspnea, chest pain or abdominal pain. Denies bleeding. Had many BM's. No noticeable bright red blood in stool. -Typed and crossed on 4/11 -s/p one unit pRBC. Hgb 7.7>1u>7.7>7.3>1u>>8.7>7.9 -IV iron today -CBC at 2 pm and in the morning. Transfuse if lower than prior (7.9) -GI consult - appreciate recs -Colonoscopy or capsule endoscopy today - Cardiac monitoring  Nonanginal chest pain: resolved.  Nonanginal in nature. ACS work up negative -appreciate card recs  -hold Xarelto pending GI blessing  -signed off  AFib: recent diagnosis. She is in SR now. CHAD-Vasc score 5. HAS-BLED score 2 (1.88 bleeds per 100 patient-years). On amiodarone and Xarelto at home -Hold Xarelto until GI work up is complete -Continue Amiodrone, BB, and digoxin -Appreciate recs  HFrEF: Echo 08/2015 with EF 30-35%, diffuse  hypokinesis. Chemo induced cardiomyopathy s/p St. Jude's ICD in 2014. On coreg, digoxin, Ivabradine, spironolactone and ramipril, lasix and metolazone. No sign of fluid overload this morning except for trace edema in her legs. -Hold the spironolactone, metalozone and ACEi in the setting of tenuous BP with GIB -Continue other home meds as BP tolerates -daily weights & Is&Os. Weight 220>211 since admission. UOP in chart not reliable. Patient reports good urine output. -BMP in the morning  Hypertension: normotensive this morning. Some low BP's overnight. -continue meds as above -Consider decreasing coreg to 6.25 mg twice a day if continues to be hypotensive.  AKI: improved. Cr 1.58>1.55>>1.08. Baseline about 0.9. Likely prerenal. -Hold nephrotoxic meds -encourage by mouth hydration - hold IVF in setting of HFrEF  Hypokalemia: resolved. K 3.1>3.8 -Replete with KDur 65mEq x1 now - am BMP  DM-2: on Humalog 75/25 60 units bid and Humalog 40 units twice a day with meals. A1c 7.3. CBGs in 200's. Received 60 u of Lantus and 16 units of novolog -increase Lantus to 70 today -moderate SSI  Hyperlipidemia: on atorvastatin -Continue home atorvastatin  OSA: on CPAP at home -Will continue CPAP qhs here  Restless leg syndrome: -continue home Ropinirole  Hypothyroidism: TSH 3.8 in 10/2015. On synthroid 75 mcg -continue home synthroid  Anxiety: stable. On buspirone at home -Continue home buspirone  FEN/GI:  -Card modified and heart health diet after scope - SLIV  Prophylaxis: SCD in the setting of GI bleed  Disposition: transfer to floor.   Subjective: Patient lying in bed. Reports good night. Denies shortness of breath, chest pain & abdominal pain. Reports good urine output. Had multiple bowel movement without frank blood. She understands the plan today.   Objective:  Temp:  [97.5 F (36.4 C)-98.9 F (37.2 C)] 97.6 F (36.4 C) (04/14 0448) Pulse Rate:  [71-88] 82 (04/14 0448) Resp:   [13-24] 18 (04/14 0448) BP: (94-127)/(38-59) 127/55 mmHg (04/14 0448) SpO2:  [99 %-100 %] 100 % (04/14 0448) Weight:  [211 lb 3.2 oz (95.8 kg)] 211 lb 3.2 oz (95.8 kg) (04/14 0448) Physical Exam: Gen: appears well, lying on bed without distress CV: regular rate and rythm. S1 & S2 audible, no murmurs, trace edema. Resp: no apparent work of breathing, good aeration bilaterally, clear to auscultation bilaterally. GI: bowel sounds normal, no tenderness to palpation. GU: no suprapubic tenderness Skin: no lesion/rash Neuro: AO x4. No gross deficit Psych: euthymic, appropriate affect  Laboratory:  Recent Labs Lab 11/15/15 1500 11/16/15 0339 11/16/15 2235 11/17/15 0231  WBC 9.4 8.0  --  8.2  HGB 7.9* 7.3* 8.7* 7.9*  HCT 25.3* 23.7* 28.2* 25.2*  PLT 396 344  --  307    Recent Labs Lab 11/14/15 1240 11/15/15 0213 11/16/15 0903  NA 134* 131* 137  K 3.1* 3.5 3.8  CL 92* 92* 97*  CO2 28 28 29   BUN 55* 51* 27*  CREATININE 1.58* 1.55* 1.08*  CALCIUM 10.7* 9.9 9.8  PROT  --  6.7  --   BILITOT  --  2.5*  --   ALKPHOS  --  56  --   ALT  --  20  --   AST  --  24  --   GLUCOSE 152* 233* 262*    Imaging/Diagnostic Tests: No results found.  Mercy Riding, MD 11/17/2015, 7:04 AM PGY-1, [facility] Intern pager: [phone], text pages welcome

## 2015-11-17 NOTE — Evaluation (Signed)
Physical Therapy Evaluation Patient Details Name: [name] MRN: [id number] DOB: 03/17/1956 Today's Date: 11/17/2015   History of Present Illness  [name] is a 61 y.o. female presenting with sudden blurry vision and gradually worsening shortness of breath . 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  Clinical Impression  Pt admitted with above diagnosis. Pt currently with functional limitations due to the deficits listed below (see PT Problem List). Pt was able to ambulate without device with overall good safety awareness.  Pt did not have LOB and scored 19/24 on DGI suggestive at low risk of falls without device.  Pt aware to use device prn either cane or RW if needed.  Will follow acutely but pt does not feel that she will need [place] f/u.  Pt feels she is close to her baseline.   Pt will benefit from skilled PT to increase their independence and safety with mobility to allow discharge to the venue listed below.     Follow Up Recommendations No PT follow up    Equipment Recommendations  None recommended by PT    Recommendations for Other Services       Precautions / Restrictions Precautions Precautions: Fall Restrictions Weight Bearing Restrictions: No      Mobility  Bed Mobility Overal bed mobility: Independent                Transfers Overall transfer level: Independent               General transfer comment: Transferred to 3N1 to use bathroom.  Was able to pull Depends down and to clean self independently.   Ambulation/Gait Ambulation/Gait assistance: Supervision Ambulation Distance (Feet): 300 Feet Assistive device: None Gait Pattern/deviations: Step-through  pattern;Decreased stride length   Gait velocity interpretation: <1.8 ft/sec, indicative of risk for recurrent falls General Gait Details: Pt ambulates without device with good balance overall.  Pt has devices to use prn as well.  Pt appears to be aware of safety overall.  Pt feels she is close to baseline.  Will follow in hospital to ensure mobility.    Stairs Stairs:  (declined saying she has no problems.)          Wheelchair Mobility    Modified Rankin (Stroke Patients Only)       Balance Overall balance assessment: History of Falls;Independent (2 falls, one due to anemia, 1 due to tripped on dog food bag)                               Standardized Balance Assessment Standardized Balance Assessment : Dynamic Gait Index   Dynamic Gait Index Level Surface: Normal Change in Gait Speed: Mild Impairment Gait with Horizontal Head Turns: Normal Gait with Vertical Head Turns: Mild Impairment Gait and Pivot Turn: Normal Step Over Obstacle: Mild Impairment Step Around Obstacles: Normal Steps: Moderate Impairment Total Score: 19       Pertinent Vitals/Pain Pain Assessment: No/denies pain  VSS with HR 72bpm, 100% O2 on RA, BP 108/51.      Home Living Family/patient expects to be discharged to:: Private residence Living Arrangements: Other relatives Available Help at Discharge: Family;Available PRN/intermittently (pt cooks for her  brother, states she may move to an apt) Type of Home: Mobile home Home Access: Stairs to enter Entrance Stairs-Rails: Right;Left;Can reach both Entrance Stairs-Number of Steps: 4 Home Layout: One level Home Equipment: Walker - 2 wheels;Walker - 4 wheels;Wheelchair - manual;Cane - single point;Shower seat;Bedside commode      Prior Function Level of Independence: Independent with assistive device(s)               Hand Dominance   Dominant Hand: Right    Extremity/Trunk Assessment   Upper Extremity Assessment: Defer to  OT evaluation           Lower Extremity Assessment: Overall WFL for tasks assessed      Cervical / Trunk Assessment: Normal  Communication   Communication: No difficulties  Cognition Arousal/Alertness: Awake/alert Behavior During Therapy: WFL for tasks assessed/performed Overall Cognitive Status: Within Functional Limits for tasks assessed                      General Comments General comments (skin integrity, edema, etc.): Scored 19/24 on DGI suggesting low risk of falls without Device.  Pt has cane and RW's at home to use prn.     Exercises        Assessment/Plan    PT Assessment Patient needs continued PT services  PT Diagnosis Generalized weakness   PT Problem List Decreased mobility;Decreased knowledge of use of DME;Decreased safety awareness;Decreased knowledge of precautions;Decreased activity tolerance  PT Treatment Interventions DME instruction;Gait training;Functional mobility training;Therapeutic activities;Therapeutic exercise;Balance training;Stair training;Patient/family education   PT Goals (Current goals can be found in the Care Plan section) Acute Rehab PT Goals Patient Stated Goal: to go home PT Goal Formulation: With patient Time For Goal Achievement: 12/01/15 Potential to Achieve Goals: Good    Frequency Min 3X/week   Barriers to discharge Decreased caregiver support cooks for brother and cleans up after him per pt    Co-evaluation               End of Session Equipment Utilized During Treatment: Gait belt Activity Tolerance: Patient limited by fatigue Patient left: in chair;with call bell/phone within reach (getting ready to be transported to new floor) Nurse Communication: Mobility status         Time: [id number] PT Time Calculation (min) (ACUTE ONLY): 25 min   Charges:   PT Evaluation $PT Eval Moderate Complexity: 1 Procedure PT Treatments $Gait Training: 8-22 mins   PT G CodesIrwin Brakeman F 11-24-2015,  10:56 AM [facility] Acute Rehabilitation [phone] (pager)

## 2015-11-17 NOTE — Progress Notes (Signed)
Transferred patient to 2W04 per wheelchair.  Patient's sister, Ellard Artis, is notified of this transfer.

## 2015-11-18 LAB — MAGNESIUM: Magnesium: 1.6 mg/dL — ABNORMAL LOW (ref 1.7–2.4)

## 2015-11-18 LAB — TYPE AND SCREEN
ABO/RH(D): A POS
Antibody Screen: NEGATIVE
UNIT DIVISION: 0
UNIT DIVISION: 0
Unit division: 0

## 2015-11-18 LAB — BASIC METABOLIC PANEL
ANION GAP: 11 (ref 5–15)
BUN: 8 mg/dL (ref 6–20)
CHLORIDE: 98 mmol/L — AB (ref 101–111)
CO2: 27 mmol/L (ref 22–32)
Calcium: 8.9 mg/dL (ref 8.9–10.3)
Creatinine, Ser: 0.94 mg/dL (ref 0.44–1.00)
GFR calc non Af Amer: >60 mL/min (ref >60)
Glucose, Bld: 213 mg/dL — ABNORMAL HIGH (ref 65–99)
Potassium: 3.2 mmol/L — ABNORMAL LOW (ref 3.5–5.1)
Sodium: 136 mmol/L (ref 135–145)

## 2015-11-18 LAB — GLUCOSE, CAPILLARY
Glucose-Capillary: 200 mg/dL — ABNORMAL HIGH (ref 65–99)
Glucose-Capillary: 265 mg/dL — ABNORMAL HIGH (ref 65–99)
Glucose-Capillary: 218 mg/dL — ABNORMAL HIGH (ref 65–99)

## 2015-11-18 LAB — CBC
HEMATOCRIT: 26.6 % — AB (ref 36.0–46.0)
HEMOGLOBIN: 8.1 g/dL — AB (ref 12.0–15.0)
MCH: 26.7 pg (ref 26.0–34.0)
MCHC: 30.5 g/dL (ref 30.0–36.0)
MCV: 87.8 fL (ref 78.0–100.0)
Platelets: 354 10*3/uL (ref 150–400)
RBC: 3.03 MIL/uL — ABNORMAL LOW (ref 3.87–5.11)
RDW: 17.1 % — ABNORMAL HIGH (ref 11.5–15.5)
WBC: 8.3 10*3/uL (ref 4.0–10.5)

## 2015-11-18 MED ORDER — POTASSIUM CHLORIDE CRYS ER 20 MEQ PO TBCR
40.0000 meq | EXTENDED_RELEASE_TABLET | Freq: Three times a day (TID) | ORAL | Status: DC
  Administered 2015-11-18 (×2): 40 meq via ORAL
  Filled 2015-11-18 (×3): qty 2

## 2015-11-18 MED ORDER — FUROSEMIDE 40 MG PO TABS
40.0000 mg | ORAL_TABLET | Freq: Two times a day (BID) | ORAL | Status: DC
  Administered 2015-11-18: 40 mg via ORAL
  Filled 2015-11-18: qty 1

## 2015-11-18 MED ORDER — CARVEDILOL 6.25 MG PO TABS: 6.2500 mg | ORAL_TABLET | Freq: Two times a day (BID) | ORAL | Status: DC

## 2015-11-18 MED ORDER — CARVEDILOL 6.25 MG PO TABS
6.2500 mg | ORAL_TABLET | Freq: Two times a day (BID) | ORAL | Status: DC
  Administered 2015-11-18: 6.25 mg via ORAL
  Filled 2015-11-18: qty 1

## 2015-11-18 MED ORDER — APIXABAN 5 MG PO TABS: 5.0000 mg | ORAL_TABLET | Freq: Two times a day (BID) | ORAL | Status: DC

## 2015-11-18 MED ORDER — RAMIPRIL 2.5 MG PO CAPS
2.5000 mg | ORAL_CAPSULE | Freq: Every day | ORAL | Status: DC
  Administered 2015-11-18: 2.5 mg via ORAL
  Filled 2015-11-18 (×2): qty 1

## 2015-11-18 MED ORDER — RAMIPRIL 2.5 MG PO CAPS: 2.5000 mg | ORAL_CAPSULE | Freq: Every day | ORAL | Status: DC

## 2015-11-18 MED ORDER — SPIRONOLACTONE 25 MG PO TABS
25.0000 mg | ORAL_TABLET | Freq: Every day | ORAL | Status: DC
  Administered 2015-11-18: 25 mg via ORAL
  Filled 2015-11-18: qty 1

## 2015-11-18 MED ORDER — APIXABAN 5 MG PO TABS
5.0000 mg | ORAL_TABLET | Freq: Two times a day (BID) | ORAL | Status: DC
Start: 2015-11-18 — End: 2015-11-18
  Administered 2015-11-18: 5 mg via ORAL
  Filled 2015-11-18: qty 1

## 2015-11-18 NOTE — Discharge Instructions (Signed)
It has been a pleasure taking care of you! You were admitted due to shortness of breath, fatigue and sudden blurry vision. Unclear what caused blurry vision. Imaging of your head didn't show bleeding in your head. The good thing is that your symptoms didn't last long. The chance of stroke is very low to none. Fatigue and shortness of breath is likely due to anemia as your blood hemoglobin was low. We think your anemia is secondary to bleeding in your gastrointestinal tract. However, endoscopy and colonoscopy didn't reveal source of your bleeding. However, you will follow up with gastroenterologist for further test.  We gave your two units of blood and intravenous iron for anemia. We have discontinued your Xarelto and started you on Elquis which has less tendency to cause bleeding. We may have made some adjustments to your other medications. Please, make sure to read the directions before you take them. The names and directions on how to take these medications are found on this discharge paper under medication section.  Please call your primary care doctor office for hospital follow up as soon as possible.   Take care,  Apixaban oral tablets What is this medicine? APIXABAN (a PIX a ban) is an anticoagulant (blood thinner). It is used to lower the chance of stroke in people with a medical condition called atrial fibrillation. It is also used to treat or prevent blood clots in the lungs or in the veins. This medicine may be used for other purposes; ask your health care provider or pharmacist if you have questions. What should I tell my health care provider before I take this medicine? They need to know if you have any of these conditions: -bleeding disorders -bleeding in the brain -blood in your stools (black or tarry stools) or if you have blood in your vomit -history of stomach bleeding -kidney disease -liver disease -mechanical heart valve -an unusual or allergic reaction to apixaban, other  medicines, foods, dyes, or preservatives -pregnant or trying to get pregnant -breast-feeding How should I use this medicine? Take this medicine by mouth with a glass of water. Follow the directions on the prescription label. You can take it with or without food. If it upsets your stomach, take it with food. Take your medicine at regular intervals. Do not take it more often than directed. Do not stop taking except on your doctor's advice. Stopping this medicine may increase your risk of a blot clot. Be sure to refill your prescription before you run out of medicine. Talk to your pediatrician regarding the use of this medicine in children. Special care may be needed. Overdosage: If you think you have taken too much of this medicine contact a poison control center or emergency room at once. NOTE: This medicine is only for you. Do not share this medicine with others. What if I miss a dose? If you miss a dose, take it as soon as you can. If it is almost time for your next dose, take only that dose. Do not take double or extra doses. What may interact with this medicine? This medicine may interact with the following: -aspirin and aspirin-like medicines -certain medicines for fungal infections like ketoconazole and itraconazole -certain medicines for seizures like carbamazepine and phenytoin -certain medicines that treat or prevent blood clots like warfarin, enoxaparin, and dalteparin -clarithromycin -NSAIDs, medicines for pain and inflammation, like ibuprofen or naproxen -rifampin -ritonavir -St. John's wort This list may not describe all possible interactions. Give your health care provider a list of  all the medicines, herbs, non-prescription drugs, or dietary supplements you use. Also tell them if you smoke, drink alcohol, or use illegal drugs. Some items may interact with your medicine. What should I watch for while using this medicine? Notify your doctor or health care professional and seek  emergency treatment if you develop breathing problems; changes in vision; chest pain; severe, sudden headache; pain, swelling, warmth in the leg; trouble speaking; sudden numbness or weakness of the face, arm, or leg. These can be signs that your condition has gotten worse. If you are going to have surgery, tell your doctor or health care professional that you are taking this medicine. Tell your health care professional that you use this medicine before you have a spinal or epidural procedure. Sometimes people who take this medicine have bleeding problems around the spine when they have a spinal or epidural procedure. This bleeding is very rare. If you have a spinal or epidural procedure while on this medicine, call your health care professional immediately if you have back pain, numbness or tingling (especially in your legs and feet), muscle weakness, paralysis, or loss of bladder or bowel control. Avoid sports and activities that might cause injury while you are using this medicine. Severe falls or injuries can cause unseen bleeding. Be careful when using sharp tools or knives. Consider using an Copy. Take special care brushing or flossing your teeth. Report any injuries, bruising, or red spots on the skin to your doctor or health care professional. What side effects may I notice from receiving this medicine? Side effects that you should report to your doctor or health care professional as soon as possible: -allergic reactions like skin rash, itching or hives, swelling of the face, lips, or tongue -signs and symptoms of bleeding such as bloody or black, tarry stools; red or dark-brown urine; spitting up blood or brown material that looks like coffee grounds; red spots on the skin; unusual bruising or bleeding from the eye, gums, or nose This list may not describe all possible side effects. Call your doctor for medical advice about side effects. You may report side effects to FDA at  1-800-FDA-1088. Where should I keep my medicine? Keep out of the reach of children. Store at room temperature between 20 and 25 degrees C (68 and 77 degrees F). Throw away any unused medicine after the expiration date. NOTE: This sheet is a summary. It may not cover all possible information. If you have questions about this medicine, talk to your doctor, pharmacist, or health care provider.    2016, Elsevier/Gold Standard. (2013-03-26 11:59:24)

## 2015-11-18 NOTE — Progress Notes (Signed)
Notified Dr. Cyndia Skeeters via tx page patient ambulated, maintained O2 Sats, tolerate well.

## 2015-11-18 NOTE — Progress Notes (Signed)
Notified Dr. Cyndia Skeeters 12 lead EKG complete/ aware and has read. This RN confirmed would ambulate patient while checking O2 SATS.

## 2015-11-18 NOTE — Evaluation (Addendum)
Occupational Therapy Evaluation Patient Details Name: [name] MRN: [id number] DOB: 1955-08-19 Today's Date: 11/18/2015    History of Present Illness [name] is a 60 y.o. female presenting with sudden blurry vision and gradually worsening shortness of breath. Melanosis and diverticulitis in colon. 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   Clinical Impression   Pt admitted with above. Pt independent with ADLs, PTA. Feel pt will benefit from acute OT to increase activity tolerance and strength prior to d/c. Do not feel pt will need follow up OT upon d/c.    Follow Up Recommendations  No OT follow up;Supervision - Intermittent    Equipment Recommendations  None recommended by OT    Recommendations for Other Services       Precautions / Restrictions Precautions Precautions: Fall Restrictions Weight Bearing Restrictions: No      Mobility Bed Mobility               General bed mobility comments: not assessed  Transfers Overall transfer level: Needs assistance   Transfers: Sit to/from Stand Sit to Stand: Supervision              Balance Overall balance assessment: History of Falls            Simulated functional tasks while standing and needed UE support.                               ADL Overall ADL's : Needs assistance/impaired     Grooming: Wash/dry hands;Set up;Supervision/safety;Standing       Lower Body Bathing: Supervison/ safety;Set up;Sit to/from stand (simulated standing-needs UE support)       Lower Body Dressing: Set up;Supervision/safety;Sit to/from stand   Toilet Transfer:  Supervision/safety;Ambulation;Regular Toilet;Grab bars   Toileting- Clothing Manipulation and Hygiene: Supervision/safety;Sit to/from stand       Functional mobility during ADLs: Supervision/safety General ADL Comments: Educated on energy conservation techniques. Educated on safety.      Vision  Pt reports no change from baseline currently; Wears reading glasses; reports cataract surgery on both eyes.   Perception     Praxis      Pertinent Vitals/Pain Pain Assessment: No/denies pain     Hand Dominance     Extremity/Trunk Assessment Upper Extremity Assessment Upper Extremity Assessment: Generalized weakness   Lower Extremity Assessment Lower Extremity Assessment: Defer to PT evaluation       Communication Communication Communication: No difficulties   Cognition Arousal/Alertness: Awake/alert Behavior During Therapy: WFL for tasks assessed/performed Overall Cognitive Status: Within Functional Limits for tasks assessed                     General Comments       Exercises       Shoulder Instructions      Home Living Family/patient expects to be discharged to:: Private residence Living Arrangements: Other relatives Available Help at Discharge: Family;Available PRN/intermittently (planning to stay with sister then daughter) Type of Home: Mobile home Home Access: Stairs to enter Entrance Stairs-Number of Steps: 4 Entrance Stairs-Rails: Right;Left;Can reach both Home Layout: One level     Bathroom Shower/Tub:  (sister's  house has walk-in and tub/shower; tub/shower at pt's daughter's house)   Bathroom Toilet: Standard Bathroom Accessibility: Yes   Home Equipment: Environmental consultant - 2 wheels;Walker - 4 wheels;Wheelchair - manual;Cane - single point;Shower seat;Bedside commode;Toilet riser;Adaptive equipment Adaptive Equipment: Reacher Additional Comments: some information taken from PT eval-unsure whose house they are referring to in home setup      Prior  Functioning/Environment Level of Independence: Needs assistance    ADL's / Homemaking Assistance Needed: assist with yardwork and cleaning        OT Diagnosis: Generalized weakness   OT Problem List: Decreased strength;Decreased activity tolerance;Impaired balance (sitting and/or standing);Decreased knowledge of use of DME or AE   OT Treatment/Interventions: Self-care/ADL training;DME and/or AE instruction;Therapeutic activities;Energy conservation;Therapeutic exercise;Patient/family education;Balance training    OT Goals(Current goals can be found in the care plan section) Acute Rehab OT Goals Patient Stated Goal: be able to do a normal house cleaning and be able to cook without sitting and to walk around [place]. OT Goal Formulation: With patient Time For Goal Achievement: 11/25/15 Potential to Achieve Goals: Good ADL Goals Pt Will Perform Tub/Shower Transfer: Tub transfer;ambulating;with set-up;shower seat Additional ADL Goal #1: Pt will independently verbalize 3 energy conservation techniques and utilize in session as needed. Additional ADL Goal #2: Pt will independently perform HEP for bilateral UEs to increase strength.  OT Frequency: Min 2X/week   Barriers to D/C:            Co-evaluation              End of Session    Activity Tolerance: Patient limited by fatigue Patient left: in chair;with call bell/phone within reach   Time: [id number] OT Time Calculation (min): 21 min Charges:  OT General Charges $OT Visit: 1 Procedure OT Evaluation $OT Eval Moderate Complexity: 1 Procedure G-CodesBenito Mccreedy OTR/L  [id number] 11/18/2015, 4:07 PM

## 2015-11-18 NOTE — Progress Notes (Signed)
D/C teaching complete including reviewing AVS printout and Rx's/ Medications. All questions answered.

## 2015-11-18 NOTE — Discharge Summary (Signed)
[facility] Discharge Summary  Patient name: [name] Medical record number: [id number] Date of birth: 05-10-1956 Age: 60 y.o. Gender: female Date of Admission: 11/14/2015  Date of Discharge: 11/19/2015 Admitting Physician: Zenia Resides, MD  Primary Care Provider: Nicoletta Dress, MD Consultants: cardiology, GI  Indication for Hospitalization: blurry vision and shortness of breath  Discharge Diagnoses/Problem List:  Iron Deficiency Anemia GI bleeding HFrEF Paroxysmal Atrial Fibrillation Hypertension DM-2 History of Breast cancer  Disposition: home  Discharge Condition: stable  Discharge Exam: Filed Vitals:   11/18/15 0226 11/18/15 0500 11/18/15 1300 11/18/15 1743  BP:  96/44 117/51   Pulse:  82 80   Temp:   97.6 F (36.4 C)   TempSrc:   Oral   Resp:  18 18   Weight: 221 lb 6.4 oz (100.426 kg)     SpO2:  96% 99% 98%   Gen: appears well, sitting on chair at beside CV: regular rate and rythm. S1 & S2 audible, no murmurs, 1+ edema. Resp: no apparent work of breathing, good aeration bilaterally, clear to auscultation bilaterally. GI: bowel sounds normal, no tenderness to palpation. GU: no suprapubic tenderness Skin: no lesion/rash Neuro: AO x4. No gross deficit Psych: euthymic, appropriate affect  Brief Hospital Course:  [name] is a 60 y.o. female presenting with sudden and brief episode of blurry vision and gradually worsening shortness of breath and fatigue. 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 lasted about 10 seconds before EMS arrived. On arrival neuro exam within normal range. CT head without intracranial hemorrhage. She  didn't have recurrence of blurry vision while in the hospital.  Symptomatic Iron Deficiency Anemia: improved.  likely secondary GI blood loss. Patient with melena and hematochezia (blood streak on stool). She was started on Xarelto for new onset Afib about a month ago, which likely exacerbated a more chronic issue. Hgb down from 11.6 to 7.7 over the last three months. Anemia panel suggestive for iron deficiency anemia. BUN elevated to 55 suggestive for UGIB. She was transfused one unit of blood without any change in her Hgb that has later trended down to 7.3. Then she received the second unit with Hgb rise to 8.3, which stayed stable at 8.1. She also received a total of 1.5 mg of iron IV.   ACS was ruled out with negative EKG times 3 and negative troponin times 3. PE ruled out with negative d-dimer. Cariology was consulted and recommended holding her Xarelto pending GI work up.  Patient had EGD that revealed gastric and duodenal polyps but source of bleeding. Colonoscopy done and revealed sessile polyps that were resected but no source of bleeding. GI recommended stopping Xarelto and starting ASA for Afib but patient allergic to ASA. So it was decided to start her on Eliquis given her high CHAD-Vasc score 5 vs HAS-BLED score of 2.  Patient will follow up with GI for capsule endoscopy and removal for duodenal polyps.   AFib: recent diagnosis. Patient in sinus rhythm throughout her stay. CHAD-Vasc score 5. HAS-BLED score 2 (1.88 bleeds per 100 patient-years). On amiodarone and Xarelto at home. [place] was held on admission and discontinued on discharge. GI recommeded ASA but patient with hives on this in the past.  So, she was discharged on Eliquis. She was continued on amiodrone and digoxin. Coreg was reduced to 6.25 mg twice a day due to soft blood pressures.   HFrEF: Echo 08/2015 with EF 30-35%, diffuse hypokinesis. Chemo induced cardiomyopathy s/p St. Jude's ICD in 2014. Patient euvolumic on exam; CXR  without pulmonary edema; BNP 218 (~baseline) arguing against CHF exacerbation. She was continued on home digoxin & Ivabradine. Due to low blood pressure, bleeding & AKI, home diuretics (lasix, spironolactone and as needed metolazone) and ramipril were held. Coreg was reduced to 6.25 mg twice a day.  After GI work up and resolution of AKI, home diuretics (lasix, metolazone, spironolactone) were resumed. Ramipril restarted at half home dose (2.5 mg) daily.  Prior to discharge, patient was evaluated by physical therapy. She maintained oxygen saturation of 98 to 100% with ambulation on room air.  Other chronic conditions were stable.  Issues for Follow Up:  1. Iron Deficiency Anemia secondary to GI blood loss: assess for bleeding. Check CBC. GI to arrange Capsule Endoscopy as outpatient. Please, follow up on this. Information about GI is listed below. 2. Atrial Fibrillation: patient discharged on Eliqius. Assess for GI bleeding 3. HFrEF: coreg and ramipril doses reduced at discharge due to low normal blood pressures. Consider increasing doses as BP tolerate.  Significant Procedures: EGD and Colonoscopy  Significant Labs and Imaging:   Recent Labs Lab 11/17/15 0231 11/17/15 1524 11/18/15 0241  WBC 8.2 9.5 8.3  HGB 7.9* 8.3* 8.1*  HCT 25.2* 26.7* 26.6*  PLT 307 335 354    Recent Labs Lab 11/14/15 1240 11/15/15 0213 11/16/15 0903 11/18/15 0241  NA 134* 131* 137 136  K 3.1* 3.5 3.8 3.2*  CL 92* 92* 97* 98*  CO2 28 28 29 27   GLUCOSE 152* 233* 262* 213*  BUN 55* 51* 27* 8  CREATININE 1.58* 1.55* 1.08* 0.94  CALCIUM 10.7* 9.9 9.8 8.9  MG  --   --   --  1.6*  ALKPHOS  --  56  --   --   AST  --  24  --   --   ALT  --  20  --   --   ALBUMIN  --  3.1*  --   --      Results/Tests Pending at Time of Discharge: none  Discharge Medications:    Medication List    STOP taking these medications        Garlic [id number] MG Caps     omega-3 acid ethyl esters 1 g capsule  Commonly known  as:  LOVAZA     rivaroxaban 20 MG Tabs tablet  Commonly known as:  XARELTO     SUMAtriptan 25 MG tablet  Commonly known as:  IMITREX     TOUJEO SOLOSTAR [place]     vitamin E 400 UNIT capsule      TAKE these medications        amiodarone 200 MG tablet  Commonly known as:  PACERONE  Take 1 tablet (200 mg total) by mouth daily.     apixaban 5 MG Tabs tablet  Commonly known as:  ELIQUIS  Take 1 tablet (5 mg total) by mouth 2 (two) times daily.     atorvastatin 40 MG tablet  Commonly known as:  LIPITOR  Take 40 mg by mouth daily.     busPIRone 30 MG tablet  Commonly known as:  BUSPAR  Take 30 mg by mouth 2 (two) times daily.     carvedilol  6.25 MG tablet  Commonly known as:  COREG  Take 1 tablet (6.25 mg total) by mouth 2 (two) times daily with a meal.     digoxin 0.125 MG tablet  Commonly known as:  LANOXIN  Take 1 tablet (0.125 mg total) by mouth every other day.     docusate sodium 100 MG capsule  Commonly known as:  COLACE  Take 3 capsules (300 mg total) by mouth at bedtime.     DULoxetine 30 MG capsule  Commonly known as:  CYMBALTA  Take 30 mg by mouth daily.     furosemide 20 MG tablet  Commonly known as:  LASIX  40 mg twice a day     Glucosamine 500 MG Caps  Take 1 capsule by mouth daily.     hydrOXYzine 50 MG tablet  Commonly known as:  ATARAX/VISTARIL  Take 50 mg by mouth every 6 (six) hours as needed for itching.     insulin lispro 100 UNIT/ML injection  Commonly known as:  HUMALOG  Inject 40 Units into the skin 2 (two) times daily with a meal.     insulin lispro 100 UNIT/ML injection  Commonly known as:  HUMALOG  Inject 0-12 Units into the skin 3 (three) times daily before meals.     insulin lispro protamine-lispro (75-25) 100 UNIT/ML Susp injection  Commonly known as:  HUMALOG 75/25 MIX  Inject 60 Units into the skin 2 (two) times daily with a meal.     Insulin Pen Needle 31G X 8 MM Misc  Commonly known as:  B-D ULTRAFINE III SHORT PEN   18/day as directed dx 250.00     ivabradine 5 MG Tabs tablet  Commonly known as:  CORLANOR  Take 1 tablet (5 mg total) by mouth 2 (two) times daily with a meal.     levothyroxine 75 MCG tablet  Commonly known as:  SYNTHROID, LEVOTHROID  Take 75 mcg by mouth daily before breakfast.     metolazone 2.5 MG tablet  Commonly known as:  ZAROXOLYN  Take 2.5 mg by mouth as needed (for weight gain >3 lbs).     mometasone 50 MCG/ACT nasal spray  Commonly known as:  NASONEX  Place 1 spray into both nostrils daily. allergies     nitroGLYCERIN 0.4 MG SL tablet  Commonly known as:  NITROSTAT  Place 1 tablet (0.4 mg total) under the tongue every 5 (five) minutes as needed. For chest pain     pantoprazole 40 MG tablet  Commonly known as:  PROTONIX  Take 40 mg by mouth 2 (two) times daily.     potassium chloride SA 20 MEQ tablet  Commonly known as:  K-DUR,KLOR-CON  Take 2 tablets (40 mEq total) by mouth 2 (two) times daily.     PROVENTIL HFA 108 (90 Base) MCG/ACT inhaler  Generic drug:  albuterol  Inhale 2 puffs into the lungs every 6 (six) hours as needed. For shortness of breath     ramipril 2.5 MG capsule  Commonly known as:  ALTACE  Take 1 capsule (2.5 mg total) by mouth daily.     rOPINIRole 1 MG tablet  Commonly known as:  REQUIP  Take 1 mg by mouth at bedtime.     spironolactone 25 MG tablet  Commonly known as:  ALDACTONE  Take 25 mg by mouth daily.     tiotropium 18 MCG inhalation capsule  Commonly known as:  SPIRIVA  Place 18 mcg into inhaler and inhale daily.  TYLENOL ARTHRITIS PAIN 650 MG CR tablet  Generic drug:  acetaminophen  Take 1,300 mg by mouth every 8 (eight) hours as needed. pain     vitamin B-12 1000 MCG tablet  Commonly known as:  CYANOCOBALAMIN  Take 1,000 mcg by mouth daily.     vitamin C 500 MG tablet  Commonly known as:  ASCORBIC ACID  Take 500 mg by mouth daily.        Discharge Instructions: Please refer to Patient Instructions section  of EMR for full details.  Patient was counseled important signs and symptoms that should prompt return to medical care, changes in medications, dietary instructions, activity restrictions, and follow up appointments.   Follow-Up Appointments: Follow-up Information    Schedule an appointment as soon as possible for a visit with Nicoletta Dress, MD.   Specialty:  Internal Medicine   Why:  hospital follow up   Contact information:   [place] [address] [phone]       Follow up with Manus Gunning, MD. Call in 1 week.   Specialty:  Gastroenterology   Why:  for follow up on bleeding   Contact information:   [place] [address] [phone]       Mercy Riding, MD 11/19/2015, 9:19 PM PGY-1, [place]

## 2015-11-18 NOTE — Progress Notes (Signed)
Family Medicine Teaching Service Daily Progress Note Intern Pager: [phone]  Patient name: [name] Medical record number: [id number] Date of birth: May 20, 1956 Age: 60 y.o. Gender: female  Primary Care Provider: Nicoletta Dress, MD Consultants: GI, Card Code Status: DNR  Pt Overview and Major Events to Date:  04/11 admitted with symptomatic anemia and transfused one unit of blood  Assessment and Plan: [name] is a 60 y.o. female presenting with sudden blurry vision and gradually worsening shortness of breath . 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  Symptomatic IDA: improved. Hgb 7.7>2u>>8.3>8.1 (11.6 about three months ago). Likely from GI bleeding exacerbated by recent start of Xarelto for Afib. EGD with gastric and duodenal polyps. Colonoscopy with sessile polyps (removed), no identifiable source of bleeding. Good bone marrow response. No dyspnea, chest pain or abdominal pain. Denies bleeding. -Typed and crossed on 4/11 -s/p 2 unit pRBC. Hgb 7.7>2u>>8.3>8.1 -s/p IV iron 1025 mg. Will give another 500 mg this morning -GI consult - appreciate recs -Capsule endoscopy as outpt  -hold Xarelto  -Can start ASA but patient with history of hives with ASA. -Will discharge with Eliquis given her high CHAD-Vasc score vs low HAS-BLED (4-5 vs. 2-3) - Cardiac monitoring  Nonanginal chest pain: resolved.  Nonanginal in nature. ACS work up negative -appreciate card recs  -signed off  AFib: recent diagnosis. She is in SR now. CHAD-Vasc score 5. HAS-BLED score 2 (1.88 bleeds per 100 patient-years). On amiodarone and Xarelto at home -Discontinue xarelto -GI recommeds ASA but patient with  hives on this in the past. -Continue Amiodrone, BB, and digoxin -Appreciate recs  HFrEF: Echo 08/2015 with EF 30-35%, diffuse hypokinesis. Chemo induced cardiomyopathy s/p St. Jude's ICD in 2014. On coreg, digoxin, Ivabradine, spironolactone and ramipril, lasix and metolazone. No shortness of breath or crackle but +1 LE. She is making less urine. Weight is up as well.  -Resume diuretics (lasix, metolazone, spironolactone) due to edema, low UOP & increased wt although low BP.  -Restart ramipril at lowest dose. -Decrease coreg to 6.25 mg twice a day  -daily weights & Is&Os. Weight and UOP difficult to interpret.   Hypertension: hypotensive this morning. 96/44. Some low BP's over the last 24 hrs. -continue meds as above  AKI: resolved. Cr 1.58>>0.94. Baseline about 0.9. Likely prerenal. -Resume meds as above -encourage by mouth hydration - hold IVF in setting of HFrEF  Hypokalemia: K 3.1>3.8>3.2 -Replete with KDur 19mEq x1 now -Resume spironolactone  DM-2: on Humalog 75/25 60 units bid and Humalog 40 units twice a day with meals. A1c 7.3. CBGs in 200's. Received 60 u of Lantus and 16 units of novolog -Lantus 70u -moderate SSI  Hyperlipidemia: on atorvastatin -Continue home atorvastatin  OSA: on CPAP at home -Will continue CPAP qhs here  Restless leg syndrome: -continue home Ropinirole  Hypothyroidism: TSH 3.8 in 10/2015. On synthroid 75 mcg -continue home synthroid  Anxiety: stable. On buspirone at home -Continue home buspirone  FEN/GI:  -Card modified and heart health diet after scope - SLIV  Prophylaxis: SCD in the setting of GI bleed  Disposition: discharge after iron infusion eliquis and low dose BB and ACEi  Subjective: Patient sitting in chair at bedside. Reports good night. Denies shortness of  breath, chest pain & abdominal pain. Reports feeling tired after walking in the whole way. Reports her urine output is not great.  Objective: Temp:  [97.5 F (36.4 C)-97.8  F (36.6 C)] 97.5 F (36.4 C) (04/14 2145) Pulse Rate:  [73-95] 82 (04/15 0500) Resp:  [13-26] 18 (04/15 0500) BP: (96-130)/(35-75) 96/44 mmHg (04/15 0500) SpO2:  [96 %-100 %] 96 % (04/15 0500) Weight:  [221 lb 6.4 oz (100.426 kg)] 221 lb 6.4 oz (100.426 kg) (04/15 0226) Physical Exam: Gen: appears well, sitting on chair at beside CV: regular rate and rythm. S1 & S2 audible, no murmurs, 1+ edema. Resp: no apparent work of breathing, good aeration bilaterally, clear to auscultation bilaterally. GI: bowel sounds normal, no tenderness to palpation. GU: no suprapubic tenderness Skin: no lesion/rash Neuro: AO x4. No gross deficit Psych: euthymic, appropriate affect  Laboratory:  Recent Labs Lab 11/17/15 0231 11/17/15 1524 11/18/15 0241  WBC 8.2 9.5 8.3  HGB 7.9* 8.3* 8.1*  HCT 25.2* 26.7* 26.6*  PLT 307 335 354    Recent Labs Lab 11/15/15 0213 11/16/15 0903 11/18/15 0241  NA 131* 137 136  K 3.5 3.8 3.2*  CL 92* 97* 98*  CO2 28 29 27   BUN 51* 27* 8  CREATININE 1.55* 1.08* 0.94  CALCIUM 9.9 9.8 8.9  PROT 6.7  --   --   BILITOT 2.5*  --   --   ALKPHOS 56  --   --   ALT 20  --   --   AST 24  --   --   GLUCOSE 233* 262* 213*    Imaging/Diagnostic Tests: No results found.  Mercy Riding, MD 11/18/2015, 6:47 AM PGY-1, [place] Intern pager: [phone], text pages welcome

## 2015-11-18 NOTE — Progress Notes (Signed)
Ambulated 350 ft, room air, mild dyspnea; O2 Sats = 98-100%, HR = 86-102. Stated "worn out" once returned to room, sat on the bed. Denied chest pain or discomfort.

## 2015-11-18 NOTE — Progress Notes (Signed)
Spoke with Dr. Cyndia Skeeters - informed him patient's pharmacy he called Rx into is closed now and Sunday - Stated he would call her prescriptions per patient request into the [facility] in [place], [place].

## 2015-11-18 NOTE — Progress Notes (Signed)
This RN assisted patient with personal belongings via w/c to [place] tower exit to meet her sister for drive home.

## 2015-11-20 ENCOUNTER — Encounter: Payer: Self-pay | Admitting: Internal Medicine

## 2015-11-20 ENCOUNTER — Telehealth: Payer: Self-pay | Admitting: *Deleted

## 2015-11-20 ENCOUNTER — Other Ambulatory Visit: Payer: Self-pay | Admitting: *Deleted

## 2015-11-20 DIAGNOSIS — D509 Iron deficiency anemia, unspecified: Secondary | ICD-10-CM

## 2015-11-20 NOTE — Telephone Encounter (Signed)
-----   Message from Manus Gunning, MD sent at 11/20/2015  7:46 AM EDT ----- [name] would you mind coordinating this for the patient ASAP? Thanks   ----- Message -----    From: Jerene Bears, MD    Sent: 11/17/2015   5:03 PM      To: Hulan Saas, RN, #  EGD/colon neg IDA Needs video capsule as outpatient IV iron given in hospital.  Oral iron as outpatient Need to follow Hgb and iron studies. I left xarelto off until decision/timing of VCE.  Unfortunately she has asa allergy, but at least is not currently in [place]

## 2015-11-20 NOTE — Telephone Encounter (Signed)
Patient record indicates she has ICD. She will need to have the SBCE done at [facility] due to this. She will need to be admitted to a telemetry bed to have this done.

## 2015-11-20 NOTE — Telephone Encounter (Signed)
Scheduled for SBCE at [facility] endo on 11/28/15 at 8:00 AM.([name]) Then admit to telemetry. Spoke with bed control and patient will be given a bed after the procedure. No need for prior reservation.

## 2015-11-20 NOTE — Telephone Encounter (Signed)
Okay. I completed the paperwork for this and hope to have it done in the near future for her. Thank you for coordinating it.

## 2015-11-21 ENCOUNTER — Encounter (HOSPITAL_COMMUNITY): Payer: Self-pay | Admitting: Gastroenterology

## 2015-11-21 ENCOUNTER — Encounter: Payer: Self-pay | Admitting: Internal Medicine

## 2015-11-21 DIAGNOSIS — D62 Acute posthemorrhagic anemia: Secondary | ICD-10-CM | POA: Diagnosis not present

## 2015-11-21 DIAGNOSIS — E1140 Type 2 diabetes mellitus with diabetic neuropathy, unspecified: Secondary | ICD-10-CM | POA: Diagnosis not present

## 2015-11-21 DIAGNOSIS — E876 Hypokalemia: Secondary | ICD-10-CM | POA: Diagnosis not present

## 2015-11-21 NOTE — Telephone Encounter (Signed)
Left a message for patient to call back. 

## 2015-11-22 NOTE — Telephone Encounter (Signed)
Left a message for patient to call back. 

## 2015-11-23 ENCOUNTER — Encounter: Payer: Self-pay | Admitting: *Deleted

## 2015-11-23 NOTE — Telephone Encounter (Signed)
Unable to reach patient by phone. She is not returning my calls. Cancelled procedure and mailed patient a letter requesting her to call us to discuss setting up SBCE.(inpatient)

## 2015-11-24 ENCOUNTER — Telehealth: Payer: Self-pay | Admitting: Cardiology

## 2015-11-24 NOTE — Telephone Encounter (Signed)
Spoke w/ pt and requested that she send a manual transmission b/c her home monitor has not updated in at least 8 days. Pt stated that she is going to call [employer] later this week. She just got out of the hospital and doesn't feel well today.

## 2015-11-28 ENCOUNTER — Telehealth: Payer: Self-pay | Admitting: Gastroenterology

## 2015-11-28 ENCOUNTER — Encounter (HOSPITAL_COMMUNITY): Admission: RE | Payer: Self-pay | Source: Ambulatory Visit

## 2015-11-28 ENCOUNTER — Ambulatory Visit (HOSPITAL_COMMUNITY): Admission: RE | Admit: 2015-11-28 | Payer: Medicare Other | Source: Ambulatory Visit | Admitting: Gastroenterology

## 2015-11-28 DIAGNOSIS — D62 Acute posthemorrhagic anemia: Secondary | ICD-10-CM | POA: Diagnosis not present

## 2015-11-28 DIAGNOSIS — G43909 Migraine, unspecified, not intractable, without status migrainosus: Secondary | ICD-10-CM | POA: Diagnosis not present

## 2015-11-28 SURGERY — IMAGING PROCEDURE, GI TRACT, INTRALUMINAL, VIA CAPSULE
Anesthesia: LOCAL

## 2015-11-28 NOTE — Telephone Encounter (Signed)
Spoke with patient and will schedule at [facility] endo tomorrow.

## 2015-11-29 ENCOUNTER — Other Ambulatory Visit: Payer: Self-pay | Admitting: *Deleted

## 2015-11-29 ENCOUNTER — Encounter: Payer: Self-pay | Admitting: *Deleted

## 2015-11-29 DIAGNOSIS — D509 Iron deficiency anemia, unspecified: Secondary | ICD-10-CM

## 2015-11-29 NOTE — Telephone Encounter (Signed)
Spoke with [name] and scheduled patient on 12/05/15 at [facility] endo at 8:00 AM. Spoke with patient and gave her appointment date, time and verbal instructions. Mailed written instructions to patient.

## 2015-12-05 ENCOUNTER — Telehealth: Payer: Self-pay | Admitting: Cardiology

## 2015-12-05 NOTE — Telephone Encounter (Signed)
Pt has had lots of issues w/ her home monitor recently. Two weeks ago she attempted to send a remote transmission and it was unsuccessful. She called tech services and received no good answers for her problem. Instructed pt that I would attempt to call tech services later and call her back w/ the results of the conversation. Pt verbalized understanding.

## 2015-12-05 NOTE — Telephone Encounter (Signed)
Spoke w/ [employer] and they informed me that pt home monitor may not be connected or it may not have adequate cell signal. [employer] support is going to call pt and help her trouble shoot. Called pt and made her aware that [employer] was going to call her. Pt verbalized understanding.

## 2015-12-05 NOTE — Telephone Encounter (Signed)
Pt called and wanted to know if she could take her home monitor to a friends house to use her land line phone to send remote transmissions. She stated that she spends the night there once a week. I informed her that this should be fine. That she could probably leave her monitor there and it should update automatically when she is by it. Pt verbalized understanding.

## 2015-12-11 ENCOUNTER — Encounter (HOSPITAL_COMMUNITY): Payer: Self-pay

## 2015-12-11 ENCOUNTER — Encounter (HOSPITAL_COMMUNITY)
Admission: RE | Disposition: A | Discharge status: Home / Self Care | Payer: Self-pay | Source: Ambulatory Visit | Attending: Gastroenterology

## 2015-12-11 ENCOUNTER — Observation Stay (HOSPITAL_COMMUNITY)
Admission: RE | Admit: 2015-12-11 | Discharge: 2015-12-11 | Disposition: A | Discharge status: Home / Self Care | Payer: Medicare Other | Source: Ambulatory Visit | Attending: Internal Medicine | Admitting: Internal Medicine

## 2015-12-11 DIAGNOSIS — Z87891 Personal history of nicotine dependence: Secondary | ICD-10-CM | POA: Insufficient documentation

## 2015-12-11 DIAGNOSIS — E039 Hypothyroidism, unspecified: Secondary | ICD-10-CM | POA: Insufficient documentation

## 2015-12-11 DIAGNOSIS — J449 Chronic obstructive pulmonary disease, unspecified: Secondary | ICD-10-CM | POA: Diagnosis not present

## 2015-12-11 DIAGNOSIS — I4891 Unspecified atrial fibrillation: Secondary | ICD-10-CM | POA: Diagnosis not present

## 2015-12-11 DIAGNOSIS — G4733 Obstructive sleep apnea (adult) (pediatric): Secondary | ICD-10-CM | POA: Insufficient documentation

## 2015-12-11 DIAGNOSIS — I110 Hypertensive heart disease with heart failure: Secondary | ICD-10-CM | POA: Insufficient documentation

## 2015-12-11 DIAGNOSIS — I252 Old myocardial infarction: Secondary | ICD-10-CM | POA: Insufficient documentation

## 2015-12-11 DIAGNOSIS — Z9011 Acquired absence of right breast and nipple: Secondary | ICD-10-CM | POA: Diagnosis not present

## 2015-12-11 DIAGNOSIS — D62 Acute posthemorrhagic anemia: Secondary | ICD-10-CM

## 2015-12-11 DIAGNOSIS — E785 Hyperlipidemia, unspecified: Secondary | ICD-10-CM | POA: Diagnosis not present

## 2015-12-11 DIAGNOSIS — Z853 Personal history of malignant neoplasm of breast: Secondary | ICD-10-CM | POA: Diagnosis not present

## 2015-12-11 DIAGNOSIS — Z7901 Long term (current) use of anticoagulants: Secondary | ICD-10-CM | POA: Diagnosis not present

## 2015-12-11 DIAGNOSIS — Z9221 Personal history of antineoplastic chemotherapy: Secondary | ICD-10-CM | POA: Diagnosis not present

## 2015-12-11 DIAGNOSIS — R195 Other fecal abnormalities: Secondary | ICD-10-CM

## 2015-12-11 DIAGNOSIS — Z794 Long term (current) use of insulin: Secondary | ICD-10-CM | POA: Insufficient documentation

## 2015-12-11 DIAGNOSIS — I5022 Chronic systolic (congestive) heart failure: Secondary | ICD-10-CM | POA: Insufficient documentation

## 2015-12-11 DIAGNOSIS — D509 Iron deficiency anemia, unspecified: Secondary | ICD-10-CM | POA: Diagnosis not present

## 2015-12-11 DIAGNOSIS — Z95810 Presence of automatic (implantable) cardiac defibrillator: Secondary | ICD-10-CM | POA: Diagnosis not present

## 2015-12-11 HISTORY — PX: GIVENS CAPSULE STUDY: SHX5432

## 2015-12-11 SURGERY — IMAGING PROCEDURE, GI TRACT, INTRALUMINAL, VIA CAPSULE
Anesthesia: LOCAL

## 2015-12-11 SURGERY — IMAGING PROCEDURE, GI TRACT, INTRALUMINAL, VIA CAPSULE

## 2015-12-11 MED ORDER — SODIUM CHLORIDE 0.9% FLUSH: 3.0000 mL | Freq: Two times a day (BID) | INTRAVENOUS | Status: DC

## 2015-12-11 MED ORDER — IVABRADINE HCL 5 MG PO TABS
5.0000 mg | ORAL_TABLET | Freq: Two times a day (BID) | ORAL | Status: DC
  Filled 2015-12-11: qty 1

## 2015-12-11 MED ORDER — SPIRONOLACTONE 25 MG PO TABS: 25.0000 mg | ORAL_TABLET | Freq: Every day | ORAL | Status: DC

## 2015-12-11 MED ORDER — NITROGLYCERIN 0.4 MG SL SUBL: 0.4000 mg | SUBLINGUAL_TABLET | SUBLINGUAL | Status: DC | PRN

## 2015-12-11 MED ORDER — APIXABAN 5 MG PO TABS
5.0000 mg | ORAL_TABLET | Freq: Two times a day (BID) | ORAL | Status: DC
  Filled 2015-12-11: qty 1

## 2015-12-11 MED ORDER — LEVOTHYROXINE SODIUM 75 MCG PO TABS: 75.0000 ug | ORAL_TABLET | Freq: Every day | ORAL | Status: DC

## 2015-12-11 MED ORDER — FUROSEMIDE 40 MG PO TABS
40.0000 mg | ORAL_TABLET | Freq: Two times a day (BID) | ORAL | Status: DC
  Filled 2015-12-11: qty 1

## 2015-12-11 MED ORDER — ACETAMINOPHEN 325 MG PO TABS: 650.0000 mg | ORAL_TABLET | Freq: Three times a day (TID) | ORAL | Status: DC | PRN

## 2015-12-11 MED ORDER — PANTOPRAZOLE SODIUM 40 MG PO TBEC: 40.0000 mg | DELAYED_RELEASE_TABLET | Freq: Two times a day (BID) | ORAL | Status: DC

## 2015-12-11 MED ORDER — FUROSEMIDE 20 MG PO TABS: ORAL_TABLET | ORAL | Status: DC

## 2015-12-11 MED ORDER — DIGOXIN 125 MCG PO TABS: 0.1250 mg | ORAL_TABLET | ORAL | Status: DC

## 2015-12-11 MED ORDER — RAMIPRIL 2.5 MG PO CAPS
2.5000 mg | ORAL_CAPSULE | Freq: Every day | ORAL | Status: DC
  Filled 2015-12-11: qty 1

## 2015-12-11 MED ORDER — AMIODARONE HCL 200 MG PO TABS: 200.0000 mg | ORAL_TABLET | Freq: Every day | ORAL | Status: DC

## 2015-12-11 MED ORDER — POTASSIUM CHLORIDE CRYS ER 20 MEQ PO TBCR: 40.0000 meq | EXTENDED_RELEASE_TABLET | Freq: Two times a day (BID) | ORAL | Status: DC

## 2015-12-11 MED ORDER — BUSPIRONE HCL 10 MG PO TABS: 30.0000 mg | ORAL_TABLET | Freq: Two times a day (BID) | ORAL | Status: DC

## 2015-12-11 MED ORDER — ALBUTEROL SULFATE (2.5 MG/3ML) 0.083% IN NEBU: 2.5000 mg | INHALATION_SOLUTION | Freq: Four times a day (QID) | RESPIRATORY_TRACT | Status: DC | PRN

## 2015-12-11 MED ORDER — CARVEDILOL 6.25 MG PO TABS
6.2500 mg | ORAL_TABLET | Freq: Two times a day (BID) | ORAL | Status: DC
  Administered 2015-12-11: 6.25 mg via ORAL
  Filled 2015-12-11: qty 1

## 2015-12-11 MED ORDER — DULOXETINE HCL 30 MG PO CPEP: 30.0000 mg | ORAL_CAPSULE | Freq: Every day | ORAL | Status: DC

## 2015-12-11 MED ORDER — ALPRAZOLAM 0.5 MG PO TABS: 0.5000 mg | ORAL_TABLET | Freq: Every day | ORAL | Status: DC | PRN

## 2015-12-11 MED ORDER — TIOTROPIUM BROMIDE MONOHYDRATE 18 MCG IN CAPS
18.0000 ug | ORAL_CAPSULE | Freq: Every day | RESPIRATORY_TRACT | Status: DC
  Filled 2015-12-11: qty 5

## 2015-12-11 SURGICAL SUPPLY — 1 items: TOWEL COTTON PACK 4EA (MISCELLANEOUS) ×4 IMPLANT

## 2015-12-11 NOTE — Discharge Instructions (Signed)
Call Dr Doyne Keel office if you develop abdominal pain, more than mild n/v or intestinal bleeding.

## 2015-12-11 NOTE — Progress Notes (Signed)
1149 transferred in from Endo with endo RN by wheelchair ful.clearly awake and alert and oriented . Denied no pain . wih Gi camera attached as ordered Pt placed on cardiac monitor . verified to CMT with NT Clear diet taken ad lib as ordered.  Seen by PA [place]. Ok not to insert NSS.To be discharged in afternoon

## 2015-12-11 NOTE — Progress Notes (Signed)
1700 discharde to home .discharge instructions given . Verbalized understanding . Wheeled to lobby by NT

## 2015-12-11 NOTE — Progress Notes (Signed)
Patient ingested capsule at 0834, she is to be admitted for observation because of a pacemaker/ICD. Awaiting bed placement.

## 2015-12-11 NOTE — Progress Notes (Signed)
1630 Received a call from Marcy Siren . To d/c pt now Pt will take meds at as scheduled . And pt confirm  GI med taken off by Jinny Blossom rn from Endo as instructed

## 2015-12-11 NOTE — H&P (Signed)
[facility] Admission Note   Primary Care Physician:  Nicoletta Dress, MD Primary Gastroenterologist:  Dr. Havery Moros  Reason for admission: observation during capsule endoscopy for pt with cardiac pacemaker/ICD.   HPI: [name] is a 60 y.o. female. Obesity. Type 2 DM, on insulin. OSA/CPAP. 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 S/p ICD 2014. Non-obstructive CAD per 08/22/15 cath.New onset A fib, 2017, on Eliquis. Breast cancer 2005. Breast lump on left, biopsy benign 07/2013 and1/23/17. Polypharmacy.  Diffusely fatty liver on CT in 2008. Previous cholecystectomy. Restless leg. Gastroparesis. Hx of ulcer disease age 11, had EGD then. No recurrent ulcers. Remote anemia during child bearing years.  1998 Colonoscopy. "normal" per archive. No report located. 2015 Colonoscopy in [place]: "small" "non-cancerous" polyps.  Iron deficiency anemia and Heme + in setting of Xarelto 11/2015. Transfused 2 PRBCs and received parenteral iron for Hgb 7.3, 8.1 at discharge.  Due to hives, xarelto d/cd and she started Eliquis.   EGD 11/16/15   - Esophagogastric landmarks identified. Normal   esophagus  - Multiple gastric polyps, suspect benign fundic   gland polyps.  - Two duodenal polyps, could be adenomatous, not   removed on this exam due to potential need for   capsule study. These will need to be removed at a   later date.  Overall, no clear pathology noted to cause the   patient's melena and anemia. 4/14 Colonoscopy  - One 5 mm polyp (path tubular adenoma) in the proximal descending colon,   removed with a cold snare. Resected and  retrieved.  - Moderate diverticulosis in the left colon.  - Melanosis in the colon.  - The distal rectum and anal verge are normal on   retroflexion view. Plan was for capsule endoscopy.  After arranging logistics, this was arranged for today.   Pt has been doing well. Says her Hgb at PMD office ~ 10.2 two weeks ago.  At least 3 x per week, she walks for an hour, pushing a shopping "buggy" at [employer].  Brief chest pain a few weeks ago.  Some ankle, foot swelling.  No dizziness.  Energy level is good.  Appetite is great.  Weight stable.  Stools are yellow, no bleeding PR.  Chronic constipation remains and issue.  Despite using fiber supplement 3 x per day and prn Senokot, only manages a BM at best 1 x per week.  However she took the Miralax/gatorade full prep last night in readying for GCE and had large BMs.  Had never tried Miralax before.  Sugars this AM were 250.  She uses prn SSI in addition to her standing mixed dose insulin.    Past Medical History  Diagnosis Date  . 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 DYSLIPIDEMIA 05/01/2009  . OBESITY 07/24/2007  . OBSTRUCTIVE SLEEP APNEA 07/24/2007    with CPAP compliance  . RESTLESS LEG SYNDROME 07/24/2007  . SYSTOLIC HEART FAILURE, CHRONIC 01/05/2009    a. chemo induced cardiomyopathy b. cMRI (02/2009) EF 45% c. Myoview 05/2010: EF 59%, no ischemia d. RHC (01/2012) RA 20, RV 55/13/22, PA 56/34 (44), PCWP 28, Fick CO/CI: 3.1 / 1.5, PVR 5.3 WU, PA sat 42% & 48% e. ECHO (10/2012) EF 20-25%, diff HK, MV calcified annulus f. ECHO (03/2014) EF 30-35%, sev HK, inferior/inferoseptal walls, mild MR  . 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 Hypertension   . History of breast cancer 2006    with return in 2008, s/p mastectomy, XRT and chemotherapy  .  Osteoarthritis   . Anxiety   . Depression     followed by a psychiatrist  . Asthma   . GERD (gastroesophageal reflux disease)   .  Gastroparesis   . IBS (irritable bowel syndrome)   . Myocardial infarction ([place])   . ICD (implantable cardiac defibrillator) in place 11/10/2012    ICD (11/2012)  . COPD (chronic obstructive pulmonary disease) ([place])   . Persistent atrial fibrillation ([place]) 09/2014  . AICD (automatic cardioverter/defibrillator) present   . Presence of permanent cardiac pacemaker     Past Surgical History  Procedure Laterality Date  . Mastectomy      right  . Abdominal hysterectomy  1991  . Cardiac defibrillator placement  11/10/2012    SJM Fortify Assura VR implanted by Dr Rayann Heman for primary prevention  . Cholecystectomy    . Wrist surgery Bilateral   . Knee cartilage surgery    . Bi-ventricular implantable cardioverter defibrillator N/A 11/10/2012    SJM Forify Assura single chamber ICD  . Cardiac catheterization N/A 08/21/2015    Procedure: Right/Left Heart Cath and Coronary Angiography;  Surgeon: Jolaine Artist, MD;  Location: [facility];  Service: Cardiovascular;  Laterality: N/A;  . Esophagogastroduodenoscopy N/A 11/16/2015    Procedure: ESOPHAGOGASTRODUODENOSCOPY (EGD);  Surgeon: Manus Gunning, MD;  Location: [facility];  Service: Gastroenterology;  Laterality: N/A;  . Colonoscopy N/A 11/17/2015    Procedure: COLONOSCOPY;  Surgeon: Jerene Bears, MD;  Location: [facility];  Service: Endoscopy;  Laterality: N/A;    Prior to Admission medications   Medication Sig Start Date End Date Taking? Authorizing Provider  acetaminophen (TYLENOL ARTHRITIS PAIN) 650 MG CR tablet Take 1,300 mg by mouth every 8 (eight) hours as needed. pain   Yes Historical Provider, MD  albuterol (PROVENTIL HFA) 108 (90 BASE) MCG/ACT inhaler Inhale 2 puffs into the lungs every 6 (six) hours as needed. For shortness of breath   Yes Historical Provider, MD  ALPRAZolam Duanne Moron) 0.5 MG tablet Take 0.5 mg by mouth daily as needed for anxiety.  11/06/15  Yes Historical Provider, MD  amiodarone (PACERONE) 200 MG tablet  Take 1 tablet (200 mg total) by mouth daily. 09/05/15  Yes Amy D Clegg, NP  apixaban (ELIQUIS) 5 MG TABS tablet Take 1 tablet (5 mg total) by mouth 2 (two) times daily. 11/18/15  Yes Mercy Riding, MD  atorvastatin (LIPITOR) 40 MG tablet Take 40 mg by mouth daily.     Yes Historical Provider, MD  busPIRone (BUSPAR) 30 MG tablet Take 30 mg by mouth 2 (two) times daily.    Yes Historical Provider, MD  carvedilol (COREG) 6.25 MG tablet Take 1 tablet (6.25 mg total) by mouth 2 (two) times daily with a meal. 11/18/15  Yes Mercy Riding, MD  digoxin (LANOXIN) 0.125 MG tablet Take 1 tablet (0.125 mg total) by mouth every other day. 06/28/15  Yes Jolaine Artist, MD  docusate sodium (COLACE) 100 MG capsule Take 3 capsules (300 mg total) by mouth at bedtime. 10/03/15  Yes Amy D Clegg, NP  DULoxetine (CYMBALTA) 30 MG capsule Take 30 mg by mouth daily.   Yes Amy Moon, NP  furosemide (LASIX) 20 MG tablet 40 mg twice a day Patient taking differently: Take 40 mg by mouth 2 (two) times daily. 40 mg twice a day 08/22/15  Yes Amy D Clegg, NP  Glucosamine 500 MG CAPS Take 1 capsule by mouth daily.     Yes Historical Provider, MD  Cleda Clarks  100 UNIT/ML KiwkPen Inject 16-30 Units into the skin 3 (three) times daily. 30 units with breakfast, 20 units with lunch, 16 units with supper 10/30/15  Yes Historical Provider, MD  HUMALOG MIX 75/25 KWIKPEN (75-25) 100 UNIT/ML Kwikpen Inject 75 Units into the skin 2 (two) times daily. 11/21/15  Yes Historical Provider, MD  hydrOXYzine (ATARAX/VISTARIL) 50 MG tablet Take 50 mg by mouth every 6 (six) hours as needed for itching.   Yes Historical Provider, MD  insulin lispro protamine-lispro (HUMALOG 75/25 MIX) (75-25) 100 UNIT/ML SUSP injection Inject 60 Units into the skin 2 (two) times daily with a meal.   Yes Historical Provider, MD  Insulin Pen Needle (B-D ULTRAFINE III SHORT PEN) 31G X 8 MM MISC 18/day as directed dx 250.00 08/01/11  Yes Renato Shin, MD  ivabradine (CORLANOR) 5  MG TABS tablet Take 1 tablet (5 mg total) by mouth 2 (two) times daily with a meal. 08/08/15  Yes Amy D Clegg, NP  levothyroxine (SYNTHROID, LEVOTHROID) 75 MCG tablet Take 75 mcg by mouth daily before breakfast.    Yes Historical Provider, MD  metolazone (ZAROXOLYN) 2.5 MG tablet Take 2.5 mg by mouth as needed (for weight gain >3 lbs).   Yes Historical Provider, MD  mometasone (NASONEX) 50 MCG/ACT nasal spray Place 1 spray into both nostrils daily. allergies   Yes Historical Provider, MD  nitroGLYCERIN (NITROSTAT) 0.4 MG SL tablet Place 1 tablet (0.4 mg total) under the tongue every 5 (five) minutes as needed. For chest pain 10/18/15  Yes Jolaine Artist, MD  pantoprazole (PROTONIX) 40 MG tablet Take 40 mg by mouth 2 (two) times daily.     Yes Historical Provider, MD  potassium chloride SA (K-DUR,KLOR-CON) 20 MEQ tablet Take 2 tablets (40 mEq total) by mouth 2 (two) times daily. 09/05/15  Yes Amy D Clegg, NP  ramipril (ALTACE) 2.5 MG capsule Take 1 capsule (2.5 mg total) by mouth daily. 11/18/15  Yes Mercy Riding, MD  rOPINIRole (REQUIP) 1 MG tablet Take 1 mg by mouth at bedtime.     Yes Historical Provider, MD  spironolactone (ALDACTONE) 25 MG tablet Take 25 mg by mouth daily.   Yes Historical Provider, MD  SUMAtriptan (IMITREX) 100 MG tablet Take 1 tablet by mouth daily as needed. 11/28/15  Yes Historical Provider, MD  tiotropium (SPIRIVA) 18 MCG inhalation capsule Place 18 mcg into inhaler and inhale daily.     Yes Historical Provider, MD  vitamin B-12 (CYANOCOBALAMIN) 1000 MCG tablet Take 1,000 mcg by mouth daily.   Yes Historical Provider, MD  vitamin C (ASCORBIC ACID) 500 MG tablet Take 500 mg by mouth daily.   Yes Historical Provider, MD    No current facility-administered medications for this encounter.    Allergies as of 11/29/2015 - Review Complete 11/17/2015  Allergen Reaction Noted  . Bee venom Anaphylaxis 03/16/2014  . Other Other (See Comments) 08/19/2015  . Aspirin Hives   .  Ciprofloxacin Hives   . Codeine Nausea Only   . Naproxen Hives   . Sulfonamide derivatives Hives   . Nsaids Itching and Rash 12/25/2014  . Tape Rash 08/11/2011    Family History  Problem Relation Age of Onset  . Diabetes Mellitus II Mother   . Coronary artery disease Father     CABG x 3  . Breast cancer Cousin   . Breast cancer Cousin   . Uterine cancer Cousin   . Lung cancer Mother   . Congestive Heart Failure Mother   .  Hypertension Father   . Diabetes Mellitus II Father     Social History   Social History  . Marital Status: Divorced    Spouse Name: N/A  . Number of Children: N/A  . Years of Education: N/A   Occupational History  . Disabled    Social History Main Topics  . Smoking status: Former Smoker    Quit date: 04/12/2001  . Smokeless tobacco: Never Used  . Alcohol Use: No  . Drug Use: No  . Sexual Activity: Not Currently   Other Topics Concern  . Not on file   Social History Narrative   Lives with mother and brother Philis Nettle          REVIEW OF SYSTEMS: Per HPI ENT:  No dental issues GI: no dysphagia. No heartburn, but will have if she drops below BID dosing of PPI.  MS: knee stiffness and pain Heme:  No unusual or excessive bleeding or bruising. ENT: no nose bleeding or discharge.  Pulm.  No recent worsening in her breathing.    PHYSICAL EXAM: Temp:  [97.8 F (36.6 C)] 97.8 F (36.6 C) (05/08 0800) Pulse Rate:  [90] 90 (05/08 0800) Resp:  [11] 11 (05/08 0800) BP: (110)/(39) 110/39 mmHg (05/08 0800) SpO2:  [95 %] 95 % (05/08 0800) Weight:  [97.07 kg (214 lb)-97.297 kg (214 lb 8 oz)] 97.07 kg (214 lb) (05/08 0830)   General:  Pleasant, obese, looks chonically but not acutely ill.  Ears:  Not HOH.  Mouth:  No congestion or discharge  Neck: no mass, no JVD Lungs:  Clear bil.  No cough or labored resps.  Cardiac pacer/ICD palpable in upper left chest.     Heart:  RRR.  No MRG  Abdomen:  Soft, obese, NT.  ND.  No mass or HSM.  Active  BS    Rectal:  Deferred.   Msk:  No joint redness or swelling.  No joint contracture.   Extremities:  No CCE  Neurologic: oriented x 3.  No gross weakness, tremor or deficits.   Psych: Pleasant, calm, in good spirits.  Skin:  No rash, no tatoos, no telangectasia    LAB RESULTS: No results for input(s): WBC, HGB, HCT, PLT in the last 72 hours. BMET No results for input(s): NA, K, CL, CO2, GLUCOSE, BUN, CREATININE, CALCIUM in the last 72 hours. LFT No results for input(s): PROT, ALBUMIN, AST, ALT, ALKPHOS, BILITOT, BILIDIR, IBILI in the last 72 hours. PT/INR No results for input(s): LABPROT, INR in the last 72 hours.   RADIOLOGY STUDIES: No results found.   IMPRESSION:   *  Anemia.  FOBT + 11/2015.  EGD, Colonscopy.  Gastric, duodenal and colon polyps.  Colon diverticulosis.   *  Chronic constipation.  Melanosis on 11/2015 colonoscopy.    *  Chronic Eliquis for A fib  *  CHF.  S/p ICD and pacemaker   PLAN:  *  Observation on telemetry for 8 hours, during the capsule endoscopy (VCE), per protocol.  Will be able to discharge at ~ 4:30 PM today.  Pt had already decided she would not take any of her standing mixed dose insulin until she resumes her diet.  We will stick with this plan.   *  Dr Mickey Farber office will contact pt and arrange repeat EGD for removal of duodenal polyps off Eliquis.  They will also inform her of findings on VCE.        Azucena Freed  12/11/2015, 11:05 AM Pager [phone]

## 2015-12-12 ENCOUNTER — Encounter (HOSPITAL_COMMUNITY): Payer: Self-pay | Admitting: Gastroenterology

## 2015-12-14 ENCOUNTER — Telehealth: Payer: Self-pay | Admitting: Gastroenterology

## 2015-12-14 NOTE — Telephone Encounter (Signed)
Spoke with patient and she woke up from a nap with an irritation at her abdomen like a pimple. She could not see anything but was worried it was the capsule. She reports she has not passed it(test was Monday). Reassured her that sometimes it takes a week to pass. Told her she would not be able to feel the capsule  In her abdomen with her hand. She understands to call back for vomiting, increase in abdominal pain.

## 2015-12-15 DIAGNOSIS — D509 Iron deficiency anemia, unspecified: Secondary | ICD-10-CM | POA: Diagnosis not present

## 2015-12-15 NOTE — Discharge Summary (Signed)
Discharge Summary:  Name: [name] MRN: [id number] DOB: 1956/07/28 60 y.o. PCP:  Nicoletta Dress, MD  Date of Admission: 12/11/2015  7:26 AM Date of Discharge: 12/15/2015 Attending Physician: No att. providers found  Admitting Dignosis: Active Problems:   Atrial fibrillation ([place])   Anticoagulation on Xarelto   S/p ICD 2014   Anemia   Hx heme + stools.   Discharge Diagnosis: Active Problems:   Atrial fibrillation ([place])   Chronic xarelto.    ICD 2014.    Anemia   Hx Heme + stool.    Previous Medical/Surgical history Past Medical History  Diagnosis Date  . 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 DYSLIPIDEMIA 05/01/2009  . OBESITY 07/24/2007  . OBSTRUCTIVE SLEEP APNEA 07/24/2007    with CPAP compliance  . RESTLESS LEG SYNDROME 07/24/2007  . SYSTOLIC HEART FAILURE, CHRONIC 01/05/2009    a. chemo induced cardiomyopathy b. cMRI (02/2009) EF 45% c. Myoview 05/2010: EF 59%, no ischemia d. RHC (01/2012) RA 20, RV 55/13/22, PA 56/34 (44), PCWP 28, Fick CO/CI: 3.1 / 1.5, PVR 5.3 WU, PA sat 42% & 48% e. ECHO (10/2012) EF 20-25%, diff HK, MV calcified annulus f. ECHO (03/2014) EF 30-35%, sev HK, inferior/inferoseptal walls, mild MR  . 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 Hypertension   . History of breast cancer 2006    with return in 2008, s/p mastectomy, XRT and chemotherapy  . Osteoarthritis   . Anxiety   . Depression     followed by a psychiatrist  . Asthma   . GERD (gastroesophageal reflux disease)   . Gastroparesis   . IBS (irritable bowel syndrome)   . Myocardial infarction ([place])   . ICD (implantable cardiac defibrillator) in place 11/10/2012    ICD (11/2012)  . COPD (chronic obstructive pulmonary disease) ([place])   . Persistent atrial fibrillation ([place]) 09/2014  . AICD (automatic cardioverter/defibrillator)  present   . Presence of permanent cardiac pacemaker    Past Surgical History  Procedure Laterality Date  . Mastectomy      right  . Abdominal hysterectomy  1991  . Cardiac defibrillator placement  11/10/2012    SJM Fortify Assura VR implanted by Dr Rayann Heman for primary prevention  . Cholecystectomy    . Wrist surgery Bilateral   . Knee cartilage surgery    . Bi-ventricular implantable cardioverter defibrillator N/A 11/10/2012    SJM Forify Assura single chamber ICD  . Cardiac catheterization N/A 08/21/2015    Procedure: Right/Left Heart Cath and Coronary Angiography;  Surgeon: Jolaine Artist, MD;  Location: [facility];  Service: Cardiovascular;  Laterality: N/A;  . Esophagogastroduodenoscopy N/A 11/16/2015    Procedure: ESOPHAGOGASTRODUODENOSCOPY (EGD);  Surgeon: Manus Gunning, MD;  Location: [facility];  Service: Gastroenterology;  Laterality: N/A;  . Colonoscopy N/A 11/17/2015    Procedure: COLONOSCOPY;  Surgeon: Jerene Bears, MD;  Location: [facility];  Service: Endoscopy;  Laterality: N/A;  . Givens capsule study N/A 12/11/2015    Procedure: GIVENS CAPSULE STUDY;  Surgeon: Manus Gunning, MD;  Location: [facility];  Service: Gastroenterology;  Laterality: N/A;    Brief History: HPI:  [name] is a 60 y.o. female. PMH Obesity. Type 2 DM, on insulin. OSA/CPAP. 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 S/p ICD 2014. Non-obstructive CAD per 08/22/15 cath.New onset A fib, 2017, on Eliquis. Breast cancer 2005. Breast lump on left, biopsy benign 07/2013 and1/23/17. Polypharmacy. Diffusely fatty liver on CT in 2008. Previous cholecystectomy. Restless leg. Gastroparesis. Hx of ulcer disease age 79, had EGD then. No recurrent ulcers. Remote anemia during child bearing years.  1998 Colonoscopy. "normal" per archive. No report located. 2015 Colonoscopy in [place]: "small" "non-cancerous" polyps.  Iron deficiency anemia  and Heme + in setting of Xarelto 11/2015. Transfused 2 PRBCs and received parenteral iron for Hgb 7.3, 8.1 at discharge. Due to hives, xarelto d/cd and she started Eliquis.  EGD 11/16/15 - Esophagogastric landmarks identified. Normal   esophagus  - Multiple gastric polyps, suspect benign fundic   gland polyps.  - Two duodenal polyps, could be adenomatous, not   removed on this exam due to potential need for   capsule study. These will need to be removed at a   later date.  Overall, no clear pathology noted to cause the   patient's melena and anemia. 4/14 Colonoscopy - One 5 mm polyp (path tubular adenoma) in the proximal descending colon,   removed with a cold snare. Resected and retrieved.  - Moderate diverticulosis in the left colon.  - Melanosis in the colon.  - The distal rectum and anal verge are normal on   retroflexion view. Plan was for capsule endoscopy.since pt has ICD in place, this needed to be done as it pt on telemetry unit.  After arranging logistics, capsule study was arranged for 5/13.    Hospital Course by problem list: Pt admitted in AM of 5/13 after swallowed the endoscopic capsule and was outfitted with imaging receptacle device.  She was observed for 8 hours on telemetry unit.  No problems during her stay.  Diet advanced from clears to solids.  After 8 hours the capsule study was completed and she discharged in stable condition.  No changes were made in her medications at discharge.     Wt Readings from Last 1 Encounters:  12/11/15 96.344 kg (212 lb 6.4 oz)    Discharge Medications:   Medication List      TAKE these medications        ALPRAZolam 0.5 MG tablet  Commonly known as:  XANAX  Take 0.5 mg by mouth daily as needed for anxiety.     amiodarone 200 MG tablet  Commonly known as:  PACERONE  Take 1 tablet (200 mg total) by mouth daily.     apixaban 5 MG Tabs tablet  Commonly known as:  ELIQUIS  Take 1 tablet (5 mg total) by mouth 2 (two) times daily.     atorvastatin 40 MG tablet  Commonly known as:  LIPITOR  Take 40 mg by mouth daily.     busPIRone 30 MG tablet  Commonly known as:  BUSPAR  Take 30 mg by mouth 2 (two) times daily.     carvedilol 6.25 MG tablet  Commonly known as:  COREG  Take 1 tablet (6.25 mg total) by mouth 2 (two) times daily with a meal.     digoxin 0.125 MG tablet  Commonly known as:  LANOXIN  Take 1 tablet (0.125 mg total) by mouth every other day.     docusate sodium 100 MG capsule  Commonly known as:  COLACE  Take 3 capsules (300 mg total) by mouth at bedtime.     DULoxetine 30 MG capsule  Commonly known as:  CYMBALTA  Take 30 mg by mouth daily.     furosemide 20 MG tablet  Commonly known as:  LASIX  40 mg twice a day     Glucosamine 500 MG Caps  Take 1 capsule by mouth daily.     HUMALOG KWIKPEN 100 UNIT/ML KiwkPen  Generic drug:  insulin lispro  Inject 16-30 Units into the skin 3 (three) times daily. 30 units with breakfast, 20 units with lunch, 16 units with supper     hydrOXYzine 50 MG tablet  Commonly known as:  ATARAX/VISTARIL  Take 50 mg by mouth every 6 (six) hours as needed for itching.     insulin lispro protamine-lispro (75-25) 100 UNIT/ML Susp injection  Commonly known as:  HUMALOG 75/25 MIX  Inject 60 Units into the skin 2 (two) times daily with a meal.     HUMALOG MIX 75/25 KWIKPEN (75-25) 100 UNIT/ML Kwikpen  Generic drug:  Insulin Lispro Prot & Lispro  Inject 75 Units into the skin 2 (two) times daily.     Insulin Pen Needle 31G X 8 MM Misc  Commonly known as:  B-D ULTRAFINE III SHORT PEN  18/day as directed  dx 250.00     ivabradine 5 MG Tabs tablet  Commonly known as:  CORLANOR  Take 1 tablet (5 mg total) by mouth 2 (two) times daily with a meal.     levothyroxine 75 MCG tablet  Commonly known as:  SYNTHROID, LEVOTHROID  Take 75 mcg by mouth daily before breakfast.     metolazone 2.5 MG tablet  Commonly known as:  ZAROXOLYN  Take 2.5 mg by mouth as needed (for weight gain >3 lbs).     mometasone 50 MCG/ACT nasal spray  Commonly known as:  NASONEX  Place 1 spray into both nostrils daily. allergies     nitroGLYCERIN 0.4 MG SL tablet  Commonly known as:  NITROSTAT  Place 1 tablet (0.4 mg total) under the tongue every 5 (five) minutes as needed. For chest pain     pantoprazole 40 MG tablet  Commonly known as:  PROTONIX  Take 40 mg by mouth 2 (two) times daily.     potassium chloride SA 20 MEQ tablet  Commonly known as:  K-DUR,KLOR-CON  Take 2 tablets (40 mEq total) by mouth 2 (two) times daily.     PROVENTIL HFA 108 (90 Base) MCG/ACT inhaler  Generic drug:  albuterol  Inhale 2 puffs into the lungs every 6 (six) hours as needed. For shortness of breath     ramipril 2.5 MG capsule  Commonly known as:  ALTACE  Take 1 capsule (2.5 mg total) by mouth daily.     rOPINIRole 1 MG tablet  Commonly known as:  REQUIP  Take 1 mg by mouth at bedtime.     spironolactone 25 MG tablet  Commonly known as:  ALDACTONE  Take 25 mg by mouth daily.     SUMAtriptan 100 MG tablet  Commonly known as:  IMITREX  Take 1 tablet by mouth daily as needed.     tiotropium 18 MCG inhalation capsule  Commonly known as:  SPIRIVA  Place 18 mcg into inhaler and inhale daily.     TYLENOL ARTHRITIS PAIN 650 MG CR tablet  Generic drug:  acetaminophen  Take 1,300 mg by mouth every 8 (eight) hours as needed. pain  vitamin B-12 1000 MCG tablet  Commonly known as:  CYANOCOBALAMIN  Take 1,000 mcg by mouth daily.     vitamin C 500 MG tablet  Commonly known as:  ASCORBIC ACID  Take 500 mg by mouth daily.         Consultations:  None  Procedures Performed:  Capsule endoscopy.     Discharge Labs:  No labs obtained during her observation admission.    Disposition and follow-up:   Ms.[name] was discharged from  in stable condition.  Follow up Appointments: 01/05/16 with Dr Havery Moros.     Diet at Discharge: Diabetic, carb mod  Time Spent on discharge:  30 minutes   Signed: Mordecai Maes  [phone] 12/15/2015, 12:36 PM

## 2015-12-18 ENCOUNTER — Telehealth: Payer: Self-pay | Admitting: *Deleted

## 2015-12-18 ENCOUNTER — Telehealth: Payer: Self-pay | Admitting: Gastroenterology

## 2015-12-18 DIAGNOSIS — D62 Acute posthemorrhagic anemia: Secondary | ICD-10-CM | POA: Insufficient documentation

## 2015-12-18 DIAGNOSIS — T184XXA Foreign body in colon, initial encounter: Secondary | ICD-10-CM

## 2015-12-18 NOTE — Telephone Encounter (Signed)
Ok, study was complete

## 2015-12-18 NOTE — Telephone Encounter (Signed)
Spoke with patient and she does not know if she passed the capsule or not. She will come for KUB this week.

## 2015-12-18 NOTE — Telephone Encounter (Signed)
Patient had a capsule endoscopy done to evaluate her anemia. Known gastric and duodenal polyps were visualized. There was a very small amount of red blood noted in the suspected duodenum but no clear source of where it was coming from. There was some patchy erythema without focal ulceration in the more distal small bowel, and one very small AVM as well.   At this time I would recommend an interval CBC to see where her anemia is trending.  She otherwise warrants a repeat EGD for polypectomy of duodenal polyps and re-evaluation of the area to ensure no pathology causing bleeding based on capsule study results, although no clear etiology appreciated at this time, I suspect the anemia is coming from the upper tract. Last EF of 35%, we will need to discuss if her case can be done at the [facility].   She reports having dark brown stools since her discharge. She is not taking iron. She is currently taking Eliquis although states she is feeling fatigued.   I called the patient and discussed results with her and my recommendations.   Recommend CBC and iron studies at this time, will see where her Hgb is trending. Rollene Fare, can you please order these labs, and touch base with anesthesia and see if they are okay to have her EGD (push enteroscopy) done at [facility]? I would like her to get scheduled ASAP. She also needs clearance from the prescribing provider of Eliquis to hold it prior to the endoscopy. Thanks

## 2015-12-18 NOTE — Telephone Encounter (Signed)
-----   Message from Hulan Saas, RN sent at 12/14/2015  4:42 PM EDT ----- Did patient pass capsule?SA

## 2015-12-18 NOTE — Telephone Encounter (Signed)
Left a message for patient to call back. 

## 2015-12-19 ENCOUNTER — Other Ambulatory Visit (INDEPENDENT_AMBULATORY_CARE_PROVIDER_SITE_OTHER): Payer: Medicare Other

## 2015-12-19 ENCOUNTER — Other Ambulatory Visit: Payer: Self-pay

## 2015-12-19 ENCOUNTER — Ambulatory Visit (INDEPENDENT_AMBULATORY_CARE_PROVIDER_SITE_OTHER)
Admission: RE | Admit: 2015-12-19 | Discharge: 2015-12-19 | Disposition: A | Discharge status: Home / Self Care | Payer: Medicare Other | Source: Ambulatory Visit | Attending: Physician Assistant | Admitting: Physician Assistant

## 2015-12-19 ENCOUNTER — Encounter: Payer: Self-pay | Admitting: Gastroenterology

## 2015-12-19 DIAGNOSIS — T184XXA Foreign body in colon, initial encounter: Secondary | ICD-10-CM | POA: Diagnosis not present

## 2015-12-19 DIAGNOSIS — D649 Anemia, unspecified: Secondary | ICD-10-CM

## 2015-12-19 DIAGNOSIS — E1140 Type 2 diabetes mellitus with diabetic neuropathy, unspecified: Secondary | ICD-10-CM | POA: Diagnosis not present

## 2015-12-19 DIAGNOSIS — Z0389 Encounter for observation for other suspected diseases and conditions ruled out: Secondary | ICD-10-CM | POA: Diagnosis not present

## 2015-12-19 LAB — CBC WITH DIFFERENTIAL/PLATELET
BASOS PCT: 0.5 % (ref 0.0–3.0)
Basophils Absolute: 0 10*3/uL (ref 0.0–0.1)
EOS ABS: 0.2 10*3/uL (ref 0.0–0.7)
EOS PCT: 2 % (ref 0.0–5.0)
HEMATOCRIT: 31.5 % — AB (ref 36.0–46.0)
HEMOGLOBIN: 10.4 g/dL — AB (ref 12.0–15.0)
LYMPHS PCT: 18.1 % (ref 12.0–46.0)
Lymphs Abs: 1.5 10*3/uL (ref 0.7–4.0)
MCHC: 33.1 g/dL (ref 30.0–36.0)
MCV: 89 fl (ref 78.0–100.0)
Monocytes Absolute: 0.5 10*3/uL (ref 0.1–1.0)
Monocytes Relative: 6.4 % (ref 3.0–12.0)
NEUTROS ABS: 6 10*3/uL (ref 1.4–7.7)
Neutrophils Relative %: 73 % (ref 43.0–77.0)
PLATELETS: 287 10*3/uL (ref 150.0–400.0)
RBC: 3.54 Mil/uL — ABNORMAL LOW (ref 3.87–5.11)
RDW: 19.3 % — AB (ref 11.5–15.5)
WBC: 8.2 10*3/uL (ref 4.0–10.5)

## 2015-12-19 LAB — IBC PANEL
Iron: 54 ug/dL (ref 42–145)
Saturation Ratios: 14 % — ABNORMAL LOW (ref 20.0–50.0)
TRANSFERRIN: 275 mg/dL (ref 212.0–360.0)

## 2015-12-19 LAB — FERRITIN: Ferritin: 40.2 ng/mL (ref 10.0–291.0)

## 2015-12-19 NOTE — Telephone Encounter (Signed)
Dr. Havery Moros, She does not meet the required EF for [place]. Must be equal to or greater than 50. She will need to be done at [facility] or [facility]. Please, advise as to when you want this

## 2015-12-19 NOTE — Telephone Encounter (Signed)
Doc and Rollene Fare,  Due to her low EF this pt does not qualify for anesthetic care at [facility].  Thanks,  Jenny Reichmann

## 2015-12-19 NOTE — Telephone Encounter (Signed)
John thanks for clarifying. Rollene Fare, we will need to look at the schedule to coordinate this to be done at the hospital.

## 2015-12-19 NOTE — Telephone Encounter (Signed)
Patient has an EF of 30-35 %. Dr. Havery Moros is asking if she can be done in [facility]. Please, advise.

## 2015-12-19 NOTE — Telephone Encounter (Signed)
I thought 50 was the BMI cutoff and 35% was the EF cutoff? Has the rule changed? If the EF is 35% I thought she would be okay.

## 2015-12-20 ENCOUNTER — Encounter (HOSPITAL_COMMUNITY): Payer: Self-pay | Admitting: Emergency Medicine

## 2015-12-20 ENCOUNTER — Inpatient Hospital Stay (HOSPITAL_COMMUNITY)
Admission: EM | Admit: 2015-12-20 | Discharge: 2015-12-23 | DRG: 292 | Disposition: A | Discharge status: Home / Self Care | Payer: Medicare Other | Attending: Internal Medicine | Admitting: Internal Medicine

## 2015-12-20 ENCOUNTER — Other Ambulatory Visit: Payer: Self-pay

## 2015-12-20 ENCOUNTER — Emergency Department (HOSPITAL_COMMUNITY): Payer: Medicare Other

## 2015-12-20 DIAGNOSIS — K922 Gastrointestinal hemorrhage, unspecified: Secondary | ICD-10-CM | POA: Diagnosis not present

## 2015-12-20 DIAGNOSIS — E876 Hypokalemia: Secondary | ICD-10-CM | POA: Diagnosis not present

## 2015-12-20 DIAGNOSIS — E1149 Type 2 diabetes mellitus with other diabetic neurological complication: Secondary | ICD-10-CM | POA: Diagnosis present

## 2015-12-20 DIAGNOSIS — Z87891 Personal history of nicotine dependence: Secondary | ICD-10-CM | POA: Diagnosis not present

## 2015-12-20 DIAGNOSIS — Z91030 Bee allergy status: Secondary | ICD-10-CM

## 2015-12-20 DIAGNOSIS — I480 Paroxysmal atrial fibrillation: Secondary | ICD-10-CM | POA: Diagnosis not present

## 2015-12-20 DIAGNOSIS — Z7901 Long term (current) use of anticoagulants: Secondary | ICD-10-CM

## 2015-12-20 DIAGNOSIS — I2510 Atherosclerotic heart disease of native coronary artery without angina pectoris: Secondary | ICD-10-CM | POA: Diagnosis present

## 2015-12-20 DIAGNOSIS — Z79899 Other long term (current) drug therapy: Secondary | ICD-10-CM | POA: Diagnosis not present

## 2015-12-20 DIAGNOSIS — F329 Major depressive disorder, single episode, unspecified: Secondary | ICD-10-CM | POA: Diagnosis present

## 2015-12-20 DIAGNOSIS — G4733 Obstructive sleep apnea (adult) (pediatric): Secondary | ICD-10-CM | POA: Diagnosis present

## 2015-12-20 DIAGNOSIS — K589 Irritable bowel syndrome without diarrhea: Secondary | ICD-10-CM | POA: Diagnosis present

## 2015-12-20 DIAGNOSIS — Z886 Allergy status to analgesic agent status: Secondary | ICD-10-CM | POA: Diagnosis not present

## 2015-12-20 DIAGNOSIS — I481 Persistent atrial fibrillation: Secondary | ICD-10-CM | POA: Diagnosis not present

## 2015-12-20 DIAGNOSIS — E032 Hypothyroidism due to medicaments and other exogenous substances: Secondary | ICD-10-CM | POA: Diagnosis present

## 2015-12-20 DIAGNOSIS — Z803 Family history of malignant neoplasm of breast: Secondary | ICD-10-CM

## 2015-12-20 DIAGNOSIS — Z95810 Presence of automatic (implantable) cardiac defibrillator: Secondary | ICD-10-CM

## 2015-12-20 DIAGNOSIS — Z882 Allergy status to sulfonamides status: Secondary | ICD-10-CM

## 2015-12-20 DIAGNOSIS — Z885 Allergy status to narcotic agent status: Secondary | ICD-10-CM

## 2015-12-20 DIAGNOSIS — E1049 Type 1 diabetes mellitus with other diabetic neurological complication: Secondary | ICD-10-CM | POA: Diagnosis present

## 2015-12-20 DIAGNOSIS — Z9011 Acquired absence of right breast and nipple: Secondary | ICD-10-CM

## 2015-12-20 DIAGNOSIS — I252 Old myocardial infarction: Secondary | ICD-10-CM | POA: Diagnosis not present

## 2015-12-20 DIAGNOSIS — N63 Unspecified lump in breast: Secondary | ICD-10-CM | POA: Diagnosis present

## 2015-12-20 DIAGNOSIS — I509 Heart failure, unspecified: Secondary | ICD-10-CM

## 2015-12-20 DIAGNOSIS — I5023 Acute on chronic systolic (congestive) heart failure: Secondary | ICD-10-CM | POA: Diagnosis not present

## 2015-12-20 DIAGNOSIS — Z66 Do not resuscitate: Secondary | ICD-10-CM | POA: Diagnosis not present

## 2015-12-20 DIAGNOSIS — Z6835 Body mass index (BMI) 35.0-35.9, adult: Secondary | ICD-10-CM

## 2015-12-20 DIAGNOSIS — J9620 Acute and chronic respiratory failure, unspecified whether with hypoxia or hypercapnia: Secondary | ICD-10-CM

## 2015-12-20 DIAGNOSIS — R079 Chest pain, unspecified: Secondary | ICD-10-CM | POA: Diagnosis not present

## 2015-12-20 DIAGNOSIS — Z794 Long term (current) use of insulin: Secondary | ICD-10-CM

## 2015-12-20 DIAGNOSIS — E669 Obesity, unspecified: Secondary | ICD-10-CM | POA: Diagnosis present

## 2015-12-20 DIAGNOSIS — D649 Anemia, unspecified: Secondary | ICD-10-CM | POA: Diagnosis present

## 2015-12-20 DIAGNOSIS — Z8249 Family history of ischemic heart disease and other diseases of the circulatory system: Secondary | ICD-10-CM

## 2015-12-20 DIAGNOSIS — K219 Gastro-esophageal reflux disease without esophagitis: Secondary | ICD-10-CM | POA: Diagnosis present

## 2015-12-20 DIAGNOSIS — I110 Hypertensive heart disease with heart failure: Secondary | ICD-10-CM | POA: Diagnosis not present

## 2015-12-20 DIAGNOSIS — J449 Chronic obstructive pulmonary disease, unspecified: Secondary | ICD-10-CM | POA: Diagnosis present

## 2015-12-20 DIAGNOSIS — G2581 Restless legs syndrome: Secondary | ICD-10-CM | POA: Diagnosis present

## 2015-12-20 DIAGNOSIS — T451X5A Adverse effect of antineoplastic and immunosuppressive drugs, initial encounter: Secondary | ICD-10-CM | POA: Diagnosis not present

## 2015-12-20 DIAGNOSIS — IMO0002 Reserved for concepts with insufficient information to code with codable children: Secondary | ICD-10-CM

## 2015-12-20 DIAGNOSIS — E785 Hyperlipidemia, unspecified: Secondary | ICD-10-CM | POA: Diagnosis present

## 2015-12-20 DIAGNOSIS — Z86010 Personal history of colonic polyps: Secondary | ICD-10-CM

## 2015-12-20 DIAGNOSIS — R419 Unspecified symptoms and signs involving cognitive functions and awareness: Secondary | ICD-10-CM | POA: Diagnosis present

## 2015-12-20 DIAGNOSIS — I120 Hypertensive chronic kidney disease with stage 5 chronic kidney disease or end stage renal disease: Secondary | ICD-10-CM | POA: Diagnosis not present

## 2015-12-20 DIAGNOSIS — Z91048 Other nonmedicinal substance allergy status: Secondary | ICD-10-CM

## 2015-12-20 DIAGNOSIS — Z9111 Patient's noncompliance with dietary regimen: Secondary | ICD-10-CM

## 2015-12-20 DIAGNOSIS — R0602 Shortness of breath: Secondary | ICD-10-CM | POA: Diagnosis not present

## 2015-12-20 DIAGNOSIS — I427 Cardiomyopathy due to drug and external agent: Secondary | ICD-10-CM | POA: Diagnosis present

## 2015-12-20 DIAGNOSIS — Y929 Unspecified place or not applicable: Secondary | ICD-10-CM

## 2015-12-20 DIAGNOSIS — Z833 Family history of diabetes mellitus: Secondary | ICD-10-CM

## 2015-12-20 DIAGNOSIS — R229 Localized swelling, mass and lump, unspecified: Secondary | ICD-10-CM

## 2015-12-20 DIAGNOSIS — Z853 Personal history of malignant neoplasm of breast: Secondary | ICD-10-CM

## 2015-12-20 LAB — BASIC METABOLIC PANEL
ANION GAP: 11 (ref 5–15)
BUN: 13 mg/dL (ref 6–20)
CALCIUM: 9.2 mg/dL (ref 8.9–10.3)
CO2: 26 mmol/L (ref 22–32)
Chloride: 99 mmol/L — ABNORMAL LOW (ref 101–111)
Creatinine, Ser: 1.18 mg/dL — ABNORMAL HIGH (ref 0.44–1.00)
GFR, EST AFRICAN AMERICAN: 57 mL/min — AB (ref >60)
GFR, EST NON AFRICAN AMERICAN: 49 mL/min — AB (ref >60)
GLUCOSE: 106 mg/dL — AB (ref 65–99)
POTASSIUM: 3 mmol/L — AB (ref 3.5–5.1)
Sodium: 136 mmol/L (ref 135–145)

## 2015-12-20 LAB — CBC
HEMATOCRIT: 31.4 % — AB (ref 36.0–46.0)
HEMOGLOBIN: 10.2 g/dL — AB (ref 12.0–15.0)
MCH: 29.7 pg (ref 26.0–34.0)
MCHC: 32.5 g/dL (ref 30.0–36.0)
MCV: 91.5 fL (ref 78.0–100.0)
Platelets: 304 10*3/uL (ref 150–400)
RBC: 3.43 MIL/uL — AB (ref 3.87–5.11)
RDW: 17.8 % — ABNORMAL HIGH (ref 11.5–15.5)
WBC: 8.5 10*3/uL (ref 4.0–10.5)

## 2015-12-20 LAB — I-STAT TROPONIN, ED: TROPONIN I, POC: 0 ng/mL (ref 0.00–0.08)

## 2015-12-20 NOTE — ED Notes (Signed)
Pt. reports intermittent left chest pain/ tightness onset 2 am this morning , SOB and nausea , denies diaphoresis , her cardiologist is Dr. Missy Sabins.

## 2015-12-20 NOTE — ED Notes (Signed)
Jessica (PA) at bedside.

## 2015-12-20 NOTE — ED Provider Notes (Signed)
CSN: [id number]     Arrival date & time 12/20/15  2125 History   First MD Initiated Contact with Patient 12/20/15 2315     Chief Complaint  Patient presents with  . Chest Pain     (Consider location/radiation/quality/duration/timing/severity/associated sxs/prior Treatment) HPI   Patient is a 60 year old female with a history of CHF, HTN, GERD, MI, COPD, breast cancer who presents to the ED with left-sided chest pain. She states at 2:15 AM this morning she woke with left side chest pain, took a nitroglycerin pill, and the pain went away. She experienced chest pain again at 5:30 PM today. The pain was intermittent, sharp, 9/10, nonradiating. She took nitroglycerin at 7 PM, 7:15 PM, and 7:30. The pain decreased in intensity to a pressure sensation. She states associated shortness of breath, dizziness. Since arrival to the ED patient has not experienced any chest pain. She states this chest pain is similar to episodes she's had in the past. She denies nausea, vomiting, abdominal pain, diaphoresis, visual changes, syncope, headache. Patient states increased shortness of breath with exertion for the past 2 and half weeks. She has also noticed increased swelling in her feet and ankles over the past week.    Past Medical History  Diagnosis Date  . 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 DYSLIPIDEMIA 05/01/2009  . OBESITY 07/24/2007  . OBSTRUCTIVE SLEEP APNEA 07/24/2007    with CPAP compliance  . RESTLESS LEG SYNDROME 07/24/2007  . SYSTOLIC HEART FAILURE, CHRONIC 01/05/2009    a. chemo induced cardiomyopathy b. cMRI (02/2009) EF 45% c. Myoview 05/2010: EF 59%, no ischemia d. RHC (01/2012) RA 20, RV 55/13/22, PA 56/34 (44), PCWP 28, Fick CO/CI: 3.1 / 1.5, PVR 5.3 WU, PA sat 42% & 48% e. ECHO (10/2012) EF 20-25%, diff HK, MV calcified annulus f. ECHO (03/2014) EF 30-35%, sev HK, inferior/inferoseptal walls, mild MR  . 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 Hypertension   . History of breast cancer 2006    with return in 2008, s/p  mastectomy, XRT and chemotherapy  . Osteoarthritis   . Anxiety   . Depression     followed by a psychiatrist  . Asthma   . GERD (gastroesophageal reflux disease)   . Gastroparesis   . IBS (irritable bowel syndrome)   . Myocardial infarction ([place])   . ICD (implantable cardiac defibrillator) in place 11/10/2012    ICD (11/2012)  . COPD (chronic obstructive pulmonary disease) ([place])   . Persistent atrial fibrillation ([place]) 09/2014  . AICD (automatic cardioverter/defibrillator) present   . Presence of permanent cardiac pacemaker   . CHF (congestive heart failure) [facility])    Past Surgical History  Procedure Laterality Date  . Mastectomy      right  . Abdominal hysterectomy  1991  . Cardiac defibrillator placement  11/10/2012    SJM Fortify Assura VR implanted by Dr Rayann Heman for primary prevention  . Cholecystectomy    . Wrist surgery Bilateral   . Knee cartilage surgery    . Bi-ventricular implantable cardioverter defibrillator N/A 11/10/2012    SJM Forify Assura single chamber ICD  . Cardiac catheterization N/A 08/21/2015    Procedure: Right/Left Heart Cath and Coronary Angiography;  Surgeon: Jolaine Artist, MD;  Location: [place] [facility];  Service: Cardiovascular;  Laterality: N/A;  . Esophagogastroduodenoscopy N/A 11/16/2015    Procedure: ESOPHAGOGASTRODUODENOSCOPY (EGD);  Surgeon: Manus Gunning, MD;  Location: [place];  Service: Gastroenterology;  Laterality: N/A;  . Colonoscopy N/A 11/17/2015    Procedure: COLONOSCOPY;  Surgeon: Lajuan Lines  Pyrtle, MD;  Location: [place];  Service: Endoscopy;  Laterality: N/A;  . Givens capsule study N/A 12/11/2015    Procedure: GIVENS CAPSULE STUDY;  Surgeon: Manus Gunning, MD;  Location: [place];  Service: Gastroenterology;  Laterality: N/A;   Family History  Problem Relation Age of Onset  . Diabetes Mellitus II Mother   . Coronary artery disease Father     CABG x 3  . Breast cancer Cousin   . Breast cancer Cousin   .  Uterine cancer Cousin   . Lung cancer Mother   . Congestive Heart Failure Mother   . Hypertension Father   . Diabetes Mellitus II Father    Social History  Substance Use Topics  . Smoking status: Former Smoker    Quit date: 04/12/2001  . Smokeless tobacco: Never Used  . Alcohol Use: No   OB History    No data available     Review of Systems  Constitutional: Negative for fever and chills.  HENT: Negative for trouble swallowing.   Eyes: Negative for visual disturbance.  Respiratory: Positive for chest tightness and shortness of breath. Negative for cough and wheezing.   Cardiovascular: Positive for chest pain and leg swelling.  Gastrointestinal: Negative for nausea, vomiting, abdominal pain and diarrhea.  Genitourinary: Negative for dysuria and hematuria.  Musculoskeletal: Negative for back pain and neck pain.  Skin: Negative for rash.  Neurological: Positive for dizziness. Negative for syncope and weakness.  Hematological: Bruises/bleeds easily.  Psychiatric/Behavioral: Negative for confusion and agitation.      Allergies  Bee venom; Other; Aspirin; Ciprofloxacin; Codeine; Naproxen; Sulfonamide derivatives; Xarelto; Nsaids; and Tape  Home Medications   Prior to Admission medications   Medication Sig Start Date End Date Taking? Authorizing Provider  acetaminophen (TYLENOL ARTHRITIS PAIN) 650 MG CR tablet Take 1,300 mg by mouth every 8 (eight) hours as needed. pain    Historical Provider, MD  albuterol (PROVENTIL HFA) 108 (90 BASE) MCG/ACT inhaler Inhale 2 puffs into the lungs every 6 (six) hours as needed. For shortness of breath    Historical Provider, MD  ALPRAZolam Duanne Moron) 0.5 MG tablet Take 0.5 mg by mouth daily as needed for anxiety.  11/06/15   Historical Provider, MD  amiodarone (PACERONE) 200 MG tablet Take 1 tablet (200 mg total) by mouth daily. 09/05/15   Amy D Ninfa Meeker, NP  apixaban (ELIQUIS) 5 MG TABS tablet Take 1 tablet (5 mg total) by mouth 2 (two) times daily.  11/18/15   Mercy Riding, MD  atorvastatin (LIPITOR) 40 MG tablet Take 40 mg by mouth daily.      Historical Provider, MD  busPIRone (BUSPAR) 30 MG tablet Take 30 mg by mouth 2 (two) times daily.     Historical Provider, MD  carvedilol (COREG) 6.25 MG tablet Take 1 tablet (6.25 mg total) by mouth 2 (two) times daily with a meal. 11/18/15   Mercy Riding, MD  digoxin (LANOXIN) 0.125 MG tablet Take 1 tablet (0.125 mg total) by mouth every other day. 06/28/15   Jolaine Artist, MD  docusate sodium (COLACE) 100 MG capsule Take 3 capsules (300 mg total) by mouth at bedtime. 10/03/15   Amy D Ninfa Meeker, NP  DULoxetine (CYMBALTA) 30 MG capsule Take 30 mg by mouth daily.    Amy A Moon, NP  furosemide (LASIX) 20 MG tablet 40 mg twice a day 12/11/15   Vena Rua, PA-C  Glucosamine 500 MG CAPS Take 1 capsule by mouth daily.  Historical Provider, MD  HUMALOG KWIKPEN 100 UNIT/ML KiwkPen Inject 16-30 Units into the skin 3 (three) times daily. 30 units with breakfast, 20 units with lunch, 16 units with supper 10/30/15   Historical Provider, MD  HUMALOG MIX 75/25 KWIKPEN (75-25) 100 UNIT/ML Kwikpen Inject 75 Units into the skin 2 (two) times daily. 11/21/15   Historical Provider, MD  hydrOXYzine (ATARAX/VISTARIL) 50 MG tablet Take 50 mg by mouth every 6 (six) hours as needed for itching.    Historical Provider, MD  insulin lispro protamine-lispro (HUMALOG 75/25 MIX) (75-25) 100 UNIT/ML SUSP injection Inject 60 Units into the skin 2 (two) times daily with a meal.    Historical Provider, MD  Insulin Pen Needle (B-D ULTRAFINE III SHORT PEN) 31G X 8 MM MISC 18/day as directed dx 250.00 08/01/11   Renato Shin, MD  ivabradine (CORLANOR) 5 MG TABS tablet Take 1 tablet (5 mg total) by mouth 2 (two) times daily with a meal. 08/08/15   Amy D Clegg, NP  levothyroxine (SYNTHROID, LEVOTHROID) 75 MCG tablet Take 75 mcg by mouth daily before breakfast.     Historical Provider, MD  metolazone (ZAROXOLYN) 2.5 MG tablet Take 2.5 mg by  mouth as needed (for weight gain >3 lbs).    Historical Provider, MD  mometasone (NASONEX) 50 MCG/ACT nasal spray Place 1 spray into both nostrils daily. allergies    Historical Provider, MD  nitroGLYCERIN (NITROSTAT) 0.4 MG SL tablet Place 1 tablet (0.4 mg total) under the tongue every 5 (five) minutes as needed. For chest pain 10/18/15   Jolaine Artist, MD  pantoprazole (PROTONIX) 40 MG tablet Take 40 mg by mouth 2 (two) times daily.      Historical Provider, MD  potassium chloride SA (K-DUR,KLOR-CON) 20 MEQ tablet Take 2 tablets (40 mEq total) by mouth 2 (two) times daily. 09/05/15   Amy D Clegg, NP  ramipril (ALTACE) 2.5 MG capsule Take 1 capsule (2.5 mg total) by mouth daily. 11/18/15   Mercy Riding, MD  rOPINIRole (REQUIP) 1 MG tablet Take 1 mg by mouth at bedtime.      Historical Provider, MD  spironolactone (ALDACTONE) 25 MG tablet Take 25 mg by mouth daily.    Historical Provider, MD  SUMAtriptan (IMITREX) 100 MG tablet Take 1 tablet by mouth daily as needed. 11/28/15   Historical Provider, MD  tiotropium (SPIRIVA) 18 MCG inhalation capsule Place 18 mcg into inhaler and inhale daily.      Historical Provider, MD  vitamin B-12 (CYANOCOBALAMIN) 1000 MCG tablet Take 1,000 mcg by mouth daily.    Historical Provider, MD  vitamin C (ASCORBIC ACID) 500 MG tablet Take 500 mg by mouth daily.    Historical Provider, MD   BP 115/57 mmHg  Pulse 91  Temp(Src) 97.5 F (36.4 C) (Oral)  Resp 15  Ht 5\' 5"  (1.651 m)  Wt 103.42 kg  BMI 37.94 kg/m2  SpO2 97% Physical Exam  Constitutional: She is oriented to person, place, and time. She appears well-developed and well-nourished. No distress.  HENT:  Head: Normocephalic and atraumatic.  Eyes: Conjunctivae are normal.  Neck: Normal range of motion. Neck supple. No tracheal deviation present.  Cardiovascular: Normal rate and normal heart sounds.  An irregular rhythm present. Exam reveals no gallop and no friction rub.   No murmur heard. Pulses:       Radial pulses are 2+ on the right side, and 2+ on the left side.       Dorsalis pedis pulses are  2+ on the right side, and 2+ on the left side.  Pulmonary/Chest: Effort normal and breath sounds normal. No respiratory distress. She has no wheezes. She has no rales.  Positive for left-sided chest wall tenderness  Musculoskeletal: Normal range of motion.  2+ pitting edema noted to bilateral ankles and feet.  Neurological: She is alert and oriented to person, place, and time. Coordination normal.  Skin: Skin is warm and dry. She is not diaphoretic.  Psychiatric: She has a normal mood and affect. Her behavior is normal.    ED Course  Procedures (including critical care time) Labs Review Labs Reviewed  BASIC METABOLIC PANEL - Abnormal; Notable for the following:    Potassium 3.0 (*)    Chloride 99 (*)    Glucose, Bld 106 (*)    Creatinine, Ser 1.18 (*)    GFR calc non Af Amer 49 (*)    GFR calc Af Amer 57 (*)    All other components within normal limits  CBC - Abnormal; Notable for the following:    RBC 3.43 (*)    Hemoglobin 10.2 (*)    HCT 31.4 (*)    RDW 17.8 (*)    All other components within normal limits  TROPONIN I  BRAIN NATRIURETIC PEPTIDE  I-STAT TROPOININ, ED    Imaging Review Dg Chest 2 View  12/20/2015  CLINICAL DATA:  Chest pain since this morning. Shortness of breath for 2.5 weeks. Bilateral ankle swelling for 3 weeks. History of right breast cancer. Former smoker. EXAM: CHEST  2 VIEW COMPARISON:  11/14/2015 FINDINGS: Cardiac pacemaker. Mild cardiac enlargement with mild pulmonary vascular congestion. Interstitial changes in the lungs suggest mild edema. No focal airspace disease or consolidation. No blunting of costophrenic angles. No pneumothorax. Hyperinflation suggesting underlying emphysematous change. Postoperative right mastectomy. Calcification of the aorta. IMPRESSION: Cardiac enlargement with mild pulmonary vascular congestion interstitial edema. Electronically  Signed   By: Lucienne Capers M.D.   On: 12/20/2015 22:18   Dg Abd 1 View  12/19/2015  CLINICAL DATA:  Evaluate for passage of a Givens capsule. EXAM: ABDOMEN - 1 VIEW COMPARISON:  None in PACs FINDINGS: The strain lateral aspects of both flanks are excluded from the view. The inferior aspect of the rectum is excluded. No Givens capsule is observed. The bowel gas pattern is normal. There surgical clips in the gallbladder fossa. IMPRESSION: No Givens capsule is demonstrated in the field of view of the abdominal study today. Electronically Signed   By: David  Martinique M.D.   On: 12/19/2015 13:53   I have personally reviewed and evaluated these images and lab results as part of my medical decision-making.   EKG Interpretation   Date/Time:  Wednesday Dec 20 2015 21:30:47 EDT Ventricular Rate:  91 PR Interval:  218 QRS Duration: 134 QT Interval:  418 QTC Calculation: 514 R Axis:   -86 Text Interpretation:  Sinus rhythm with 1st degree A-V block Left axis  deviation Right bundle branch block Anteroseptal infarct , age  undetermined Abnormal ECG No significant change since last tracing  Confirmed by KNOTT MD, DANIEL [phone]) on 12/20/2015 11:50:31 PM      MDM   Final diagnoses:  Acute on chronic systolic congestive heart failure ([place])    Patient with extensive cardiac history presents the ED with left sided chest pain, shortness of breath on exertion, and dizziness. Patient states increase in weight of roughly 8 pounds and new onset edema in her bilateral lower extremities. VVS, afebrile. Chest x-ray reviewed  by me revealed mild pulmonary vascular congestion and interstitial edema. EKG with no significant change from last tracing. Patient's chest pain likely 2/2 CHF exacerbation. Will get repeat troponin, BNP, give 80 mg IV Lasix, replenish potassium.   Labs reveal anemia. Levels are close to patient's baseline. Elevated serum creatinine.  Dr. Laneta Simmers will consult the hospitalist team to have  patient admitted for further evaluation and treatment. Patient states she will call her cardiologist Dr.Bensimhon to follow up with them regarding her hospital visit.     Kalman Drape, [place] 12/21/15 CR:[id number]  Leo Grosser, MD 12/21/15 [phone]

## 2015-12-21 ENCOUNTER — Encounter (HOSPITAL_COMMUNITY): Payer: Self-pay | Admitting: Internal Medicine

## 2015-12-21 DIAGNOSIS — I110 Hypertensive heart disease with heart failure: Secondary | ICD-10-CM | POA: Diagnosis present

## 2015-12-21 DIAGNOSIS — K922 Gastrointestinal hemorrhage, unspecified: Secondary | ICD-10-CM | POA: Diagnosis present

## 2015-12-21 DIAGNOSIS — F329 Major depressive disorder, single episode, unspecified: Secondary | ICD-10-CM | POA: Diagnosis present

## 2015-12-21 DIAGNOSIS — D649 Anemia, unspecified: Secondary | ICD-10-CM | POA: Diagnosis present

## 2015-12-21 DIAGNOSIS — Z853 Personal history of malignant neoplasm of breast: Secondary | ICD-10-CM | POA: Diagnosis not present

## 2015-12-21 DIAGNOSIS — Z91030 Bee allergy status: Secondary | ICD-10-CM | POA: Diagnosis not present

## 2015-12-21 DIAGNOSIS — I427 Cardiomyopathy due to drug and external agent: Secondary | ICD-10-CM | POA: Diagnosis present

## 2015-12-21 DIAGNOSIS — E785 Hyperlipidemia, unspecified: Secondary | ICD-10-CM | POA: Diagnosis present

## 2015-12-21 DIAGNOSIS — I509 Heart failure, unspecified: Secondary | ICD-10-CM | POA: Insufficient documentation

## 2015-12-21 DIAGNOSIS — I2510 Atherosclerotic heart disease of native coronary artery without angina pectoris: Secondary | ICD-10-CM | POA: Diagnosis present

## 2015-12-21 DIAGNOSIS — Z86010 Personal history of colonic polyps: Secondary | ICD-10-CM | POA: Diagnosis not present

## 2015-12-21 DIAGNOSIS — E669 Obesity, unspecified: Secondary | ICD-10-CM | POA: Diagnosis present

## 2015-12-21 DIAGNOSIS — Z886 Allergy status to analgesic agent status: Secondary | ICD-10-CM | POA: Diagnosis not present

## 2015-12-21 DIAGNOSIS — Z87891 Personal history of nicotine dependence: Secondary | ICD-10-CM | POA: Diagnosis not present

## 2015-12-21 DIAGNOSIS — Z885 Allergy status to narcotic agent status: Secondary | ICD-10-CM | POA: Diagnosis not present

## 2015-12-21 DIAGNOSIS — R079 Chest pain, unspecified: Secondary | ICD-10-CM | POA: Diagnosis not present

## 2015-12-21 DIAGNOSIS — Z882 Allergy status to sulfonamides status: Secondary | ICD-10-CM | POA: Diagnosis not present

## 2015-12-21 DIAGNOSIS — J449 Chronic obstructive pulmonary disease, unspecified: Secondary | ICD-10-CM | POA: Diagnosis present

## 2015-12-21 DIAGNOSIS — Z9011 Acquired absence of right breast and nipple: Secondary | ICD-10-CM | POA: Diagnosis not present

## 2015-12-21 DIAGNOSIS — Z66 Do not resuscitate: Secondary | ICD-10-CM | POA: Diagnosis not present

## 2015-12-21 DIAGNOSIS — Z8249 Family history of ischemic heart disease and other diseases of the circulatory system: Secondary | ICD-10-CM | POA: Diagnosis not present

## 2015-12-21 DIAGNOSIS — N63 Unspecified lump in breast: Secondary | ICD-10-CM | POA: Diagnosis present

## 2015-12-21 DIAGNOSIS — E876 Hypokalemia: Secondary | ICD-10-CM | POA: Diagnosis not present

## 2015-12-21 DIAGNOSIS — Z9111 Patient's noncompliance with dietary regimen: Secondary | ICD-10-CM | POA: Diagnosis not present

## 2015-12-21 DIAGNOSIS — G2581 Restless legs syndrome: Secondary | ICD-10-CM | POA: Diagnosis present

## 2015-12-21 DIAGNOSIS — I5023 Acute on chronic systolic (congestive) heart failure: Secondary | ICD-10-CM

## 2015-12-21 DIAGNOSIS — Z7901 Long term (current) use of anticoagulants: Secondary | ICD-10-CM | POA: Diagnosis not present

## 2015-12-21 DIAGNOSIS — R419 Unspecified symptoms and signs involving cognitive functions and awareness: Secondary | ICD-10-CM | POA: Diagnosis present

## 2015-12-21 DIAGNOSIS — G4733 Obstructive sleep apnea (adult) (pediatric): Secondary | ICD-10-CM | POA: Diagnosis present

## 2015-12-21 DIAGNOSIS — Z79899 Other long term (current) drug therapy: Secondary | ICD-10-CM | POA: Diagnosis not present

## 2015-12-21 DIAGNOSIS — Z95810 Presence of automatic (implantable) cardiac defibrillator: Secondary | ICD-10-CM | POA: Diagnosis not present

## 2015-12-21 DIAGNOSIS — Y929 Unspecified place or not applicable: Secondary | ICD-10-CM | POA: Diagnosis not present

## 2015-12-21 DIAGNOSIS — Z91048 Other nonmedicinal substance allergy status: Secondary | ICD-10-CM | POA: Diagnosis not present

## 2015-12-21 DIAGNOSIS — Z6835 Body mass index (BMI) 35.0-35.9, adult: Secondary | ICD-10-CM | POA: Diagnosis not present

## 2015-12-21 DIAGNOSIS — I480 Paroxysmal atrial fibrillation: Secondary | ICD-10-CM | POA: Diagnosis present

## 2015-12-21 DIAGNOSIS — I252 Old myocardial infarction: Secondary | ICD-10-CM | POA: Diagnosis not present

## 2015-12-21 DIAGNOSIS — E1149 Type 2 diabetes mellitus with other diabetic neurological complication: Secondary | ICD-10-CM | POA: Diagnosis present

## 2015-12-21 DIAGNOSIS — K219 Gastro-esophageal reflux disease without esophagitis: Secondary | ICD-10-CM | POA: Diagnosis present

## 2015-12-21 DIAGNOSIS — Z803 Family history of malignant neoplasm of breast: Secondary | ICD-10-CM | POA: Diagnosis not present

## 2015-12-21 DIAGNOSIS — Z833 Family history of diabetes mellitus: Secondary | ICD-10-CM | POA: Diagnosis not present

## 2015-12-21 DIAGNOSIS — I481 Persistent atrial fibrillation: Secondary | ICD-10-CM | POA: Diagnosis present

## 2015-12-21 DIAGNOSIS — Z794 Long term (current) use of insulin: Secondary | ICD-10-CM | POA: Diagnosis not present

## 2015-12-21 DIAGNOSIS — E032 Hypothyroidism due to medicaments and other exogenous substances: Secondary | ICD-10-CM | POA: Diagnosis present

## 2015-12-21 DIAGNOSIS — K589 Irritable bowel syndrome without diarrhea: Secondary | ICD-10-CM | POA: Diagnosis present

## 2015-12-21 DIAGNOSIS — T451X5A Adverse effect of antineoplastic and immunosuppressive drugs, initial encounter: Secondary | ICD-10-CM | POA: Diagnosis present

## 2015-12-21 LAB — CBC WITH DIFFERENTIAL/PLATELET
BASOS ABS: 0 10*3/uL (ref 0.0–0.1)
BASOS PCT: 1 %
Eosinophils Absolute: 0.2 10*3/uL (ref 0.0–0.7)
Eosinophils Relative: 2 %
HEMATOCRIT: 33.6 % — AB (ref 36.0–46.0)
Hemoglobin: 10.4 g/dL — ABNORMAL LOW (ref 12.0–15.0)
Lymphocytes Relative: 19 %
Lymphs Abs: 1.6 10*3/uL (ref 0.7–4.0)
MCH: 28.7 pg (ref 26.0–34.0)
MCHC: 31 g/dL (ref 30.0–36.0)
MCV: 92.6 fL (ref 78.0–100.0)
MONO ABS: 0.5 10*3/uL (ref 0.1–1.0)
Monocytes Relative: 6 %
NEUTROS ABS: 5.8 10*3/uL (ref 1.7–7.7)
NEUTROS PCT: 72 %
PLATELETS: 319 10*3/uL (ref 150–400)
RBC: 3.63 MIL/uL — AB (ref 3.87–5.11)
RDW: 18 % — AB (ref 11.5–15.5)
WBC: 8.2 10*3/uL (ref 4.0–10.5)

## 2015-12-21 LAB — COMPREHENSIVE METABOLIC PANEL
ALK PHOS: 58 U/L (ref 38–126)
ALT: 21 U/L (ref 14–54)
ANION GAP: 12 (ref 5–15)
AST: 23 U/L (ref 15–41)
Albumin: 3.3 g/dL — ABNORMAL LOW (ref 3.5–5.0)
BILIRUBIN TOTAL: 1.2 mg/dL (ref 0.3–1.2)
BUN: 11 mg/dL (ref 6–20)
CALCIUM: 9.1 mg/dL (ref 8.9–10.3)
CO2: 28 mmol/L (ref 22–32)
Chloride: 99 mmol/L — ABNORMAL LOW (ref 101–111)
Creatinine, Ser: 1.1 mg/dL — ABNORMAL HIGH (ref 0.44–1.00)
GFR, EST NON AFRICAN AMERICAN: 53 mL/min — AB (ref >60)
GLUCOSE: 171 mg/dL — AB (ref 65–99)
POTASSIUM: 3.2 mmol/L — AB (ref 3.5–5.1)
Sodium: 139 mmol/L (ref 135–145)
TOTAL PROTEIN: 7.4 g/dL (ref 6.5–8.1)

## 2015-12-21 LAB — DIGOXIN LEVEL

## 2015-12-21 LAB — GLUCOSE, CAPILLARY
GLUCOSE-CAPILLARY: 168 mg/dL — AB (ref 65–99)
GLUCOSE-CAPILLARY: 96 mg/dL (ref 65–99)

## 2015-12-21 LAB — TROPONIN I
TROPONIN I: 0.03 ng/mL (ref <0.031)
Troponin I: <0.03 ng/mL (ref <0.031)
Troponin I: <0.03 ng/mL (ref <0.031)

## 2015-12-21 LAB — TSH: TSH: 4.005 u[IU]/mL (ref 0.350–4.500)

## 2015-12-21 LAB — BRAIN NATRIURETIC PEPTIDE: B NATRIURETIC PEPTIDE 5: 121.7 pg/mL — AB (ref 0.0–100.0)

## 2015-12-21 MED ORDER — VITAMIN C 500 MG PO TABS
500.0000 mg | ORAL_TABLET | Freq: Every day | ORAL | Status: DC
  Administered 2015-12-21 – 2015-12-23 (×3): 500 mg via ORAL
  Filled 2015-12-21 (×3): qty 1

## 2015-12-21 MED ORDER — CARVEDILOL 6.25 MG PO TABS
6.2500 mg | ORAL_TABLET | Freq: Two times a day (BID) | ORAL | Status: DC
  Administered 2015-12-21 – 2015-12-23 (×5): 6.25 mg via ORAL
  Filled 2015-12-21 (×5): qty 1

## 2015-12-21 MED ORDER — ONDANSETRON HCL 4 MG/2ML IJ SOLN: 4.0000 mg | Freq: Four times a day (QID) | INTRAMUSCULAR | Status: DC | PRN

## 2015-12-21 MED ORDER — HYDROXYZINE HCL 25 MG PO TABS: 50.0000 mg | ORAL_TABLET | Freq: Four times a day (QID) | ORAL | Status: DC | PRN

## 2015-12-21 MED ORDER — FLUTICASONE PROPIONATE 50 MCG/ACT NA SUSP
2.0000 | Freq: Every day | NASAL | Status: DC
  Administered 2015-12-21 – 2015-12-22 (×2): 2 via NASAL
  Filled 2015-12-21: qty 16

## 2015-12-21 MED ORDER — NITROGLYCERIN 0.4 MG SL SUBL: 0.4000 mg | SUBLINGUAL_TABLET | SUBLINGUAL | Status: DC | PRN

## 2015-12-21 MED ORDER — INSULIN LISPRO PROT & LISPRO (75-25 MIX) 100 UNIT/ML KWIKPEN: 75.0000 [IU] | PEN_INJECTOR | Freq: Two times a day (BID) | SUBCUTANEOUS | Status: DC

## 2015-12-21 MED ORDER — SODIUM CHLORIDE 0.9% FLUSH
3.0000 mL | Freq: Two times a day (BID) | INTRAVENOUS | Status: DC
  Administered 2015-12-22 (×2): 3 mL via INTRAVENOUS

## 2015-12-21 MED ORDER — PANTOPRAZOLE SODIUM 40 MG PO TBEC
40.0000 mg | DELAYED_RELEASE_TABLET | Freq: Two times a day (BID) | ORAL | Status: DC
  Administered 2015-12-21 – 2015-12-23 (×5): 40 mg via ORAL
  Filled 2015-12-21 (×5): qty 1

## 2015-12-21 MED ORDER — INSULIN ASPART 100 UNIT/ML ~~LOC~~ SOLN
0.0000 [IU] | Freq: Three times a day (TID) | SUBCUTANEOUS | Status: DC
  Administered 2015-12-21 (×2): 2 [IU] via SUBCUTANEOUS
  Administered 2015-12-22: 3 [IU] via SUBCUTANEOUS
  Administered 2015-12-22 – 2015-12-23 (×3): 2 [IU] via SUBCUTANEOUS

## 2015-12-21 MED ORDER — DIGOXIN 125 MCG PO TABS
0.1250 mg | ORAL_TABLET | ORAL | Status: DC
  Administered 2015-12-22: 0.125 mg via ORAL
  Filled 2015-12-21 (×2): qty 1

## 2015-12-21 MED ORDER — VITAMIN B-12 1000 MCG PO TABS
1000.0000 ug | ORAL_TABLET | Freq: Every day | ORAL | Status: DC
  Administered 2015-12-21 – 2015-12-23 (×3): 1000 ug via ORAL
  Filled 2015-12-21 (×3): qty 1

## 2015-12-21 MED ORDER — FUROSEMIDE 10 MG/ML IJ SOLN
80.0000 mg | Freq: Once | INTRAMUSCULAR | Status: AC
  Administered 2015-12-21: 80 mg via INTRAVENOUS
  Filled 2015-12-21: qty 8

## 2015-12-21 MED ORDER — AMIODARONE HCL 200 MG PO TABS
200.0000 mg | ORAL_TABLET | Freq: Every day | ORAL | Status: DC
  Administered 2015-12-21 – 2015-12-23 (×3): 200 mg via ORAL
  Filled 2015-12-21 (×3): qty 1

## 2015-12-21 MED ORDER — BUSPIRONE HCL 10 MG PO TABS
30.0000 mg | ORAL_TABLET | Freq: Two times a day (BID) | ORAL | Status: DC
  Administered 2015-12-21 – 2015-12-23 (×5): 30 mg via ORAL
  Filled 2015-12-21 (×5): qty 3

## 2015-12-21 MED ORDER — INSULIN ASPART PROT & ASPART (70-30 MIX) 100 UNIT/ML ~~LOC~~ SUSP
75.0000 [IU] | Freq: Two times a day (BID) | SUBCUTANEOUS | Status: DC
  Administered 2015-12-21 – 2015-12-23 (×5): 75 [IU] via SUBCUTANEOUS
  Filled 2015-12-21: qty 10

## 2015-12-21 MED ORDER — POTASSIUM CHLORIDE 10 MEQ/100ML IV SOLN
10.0000 meq | INTRAVENOUS | Status: AC
  Administered 2015-12-21 (×3): 10 meq via INTRAVENOUS
  Filled 2015-12-21 (×3): qty 100

## 2015-12-21 MED ORDER — ROPINIROLE HCL 1 MG PO TABS
1.0000 mg | ORAL_TABLET | Freq: Every day | ORAL | Status: DC
  Administered 2015-12-21 – 2015-12-22 (×2): 1 mg via ORAL
  Filled 2015-12-21 (×2): qty 1

## 2015-12-21 MED ORDER — ALBUTEROL SULFATE (2.5 MG/3ML) 0.083% IN NEBU
2.5000 mg | INHALATION_SOLUTION | Freq: Four times a day (QID) | RESPIRATORY_TRACT | Status: DC | PRN
  Administered 2015-12-22: 2.5 mg via RESPIRATORY_TRACT
  Filled 2015-12-21: qty 3

## 2015-12-21 MED ORDER — FUROSEMIDE 10 MG/ML IJ SOLN
60.0000 mg | Freq: Two times a day (BID) | INTRAMUSCULAR | Status: AC
  Administered 2015-12-21 – 2015-12-22 (×4): 60 mg via INTRAVENOUS
  Filled 2015-12-21 (×4): qty 6

## 2015-12-21 MED ORDER — DOCUSATE SODIUM 100 MG PO CAPS
300.0000 mg | ORAL_CAPSULE | Freq: Every day | ORAL | Status: DC
  Administered 2015-12-21 – 2015-12-22 (×2): 300 mg via ORAL
  Filled 2015-12-21 (×2): qty 3

## 2015-12-21 MED ORDER — DULOXETINE HCL 30 MG PO CPEP
30.0000 mg | ORAL_CAPSULE | Freq: Every day | ORAL | Status: DC
  Administered 2015-12-21 – 2015-12-23 (×3): 30 mg via ORAL
  Filled 2015-12-21 (×3): qty 1

## 2015-12-21 MED ORDER — IVABRADINE HCL 5 MG PO TABS
5.0000 mg | ORAL_TABLET | Freq: Two times a day (BID) | ORAL | Status: DC
  Administered 2015-12-21 – 2015-12-23 (×5): 5 mg via ORAL
  Filled 2015-12-21 (×5): qty 1

## 2015-12-21 MED ORDER — ACETAMINOPHEN 325 MG PO TABS: 650.0000 mg | ORAL_TABLET | Freq: Four times a day (QID) | ORAL | Status: DC | PRN

## 2015-12-21 MED ORDER — SPIRONOLACTONE 25 MG PO TABS
25.0000 mg | ORAL_TABLET | Freq: Every day | ORAL | Status: DC
  Administered 2015-12-21 – 2015-12-23 (×3): 25 mg via ORAL
  Filled 2015-12-21 (×3): qty 1

## 2015-12-21 MED ORDER — RAMIPRIL 2.5 MG PO CAPS
2.5000 mg | ORAL_CAPSULE | Freq: Every day | ORAL | Status: DC
  Administered 2015-12-21 – 2015-12-23 (×3): 2.5 mg via ORAL
  Filled 2015-12-21 (×3): qty 1

## 2015-12-21 MED ORDER — SODIUM CHLORIDE 0.9% FLUSH
3.0000 mL | Freq: Two times a day (BID) | INTRAVENOUS | Status: DC
  Administered 2015-12-21 – 2015-12-22 (×3): 3 mL via INTRAVENOUS

## 2015-12-21 MED ORDER — POTASSIUM CHLORIDE CRYS ER 20 MEQ PO TBCR
40.0000 meq | EXTENDED_RELEASE_TABLET | Freq: Two times a day (BID) | ORAL | Status: DC
  Administered 2015-12-21 – 2015-12-23 (×5): 40 meq via ORAL
  Filled 2015-12-21 (×5): qty 2

## 2015-12-21 MED ORDER — METOLAZONE 2.5 MG PO TABS
2.5000 mg | ORAL_TABLET | Freq: Once | ORAL | Status: AC
  Administered 2015-12-21: 2.5 mg via ORAL
  Filled 2015-12-21: qty 1

## 2015-12-21 MED ORDER — ATORVASTATIN CALCIUM 40 MG PO TABS
40.0000 mg | ORAL_TABLET | Freq: Every day | ORAL | Status: DC
  Administered 2015-12-21 – 2015-12-22 (×2): 40 mg via ORAL
  Filled 2015-12-21 (×2): qty 1

## 2015-12-21 MED ORDER — ONDANSETRON HCL 4 MG PO TABS: 4.0000 mg | ORAL_TABLET | Freq: Four times a day (QID) | ORAL | Status: DC | PRN

## 2015-12-21 MED ORDER — ALPRAZOLAM 0.5 MG PO TABS: 0.5000 mg | ORAL_TABLET | Freq: Every day | ORAL | Status: DC | PRN

## 2015-12-21 MED ORDER — MELATONIN 1 MG PO TABS: 0.5000 mg | ORAL_TABLET | Freq: Every day | ORAL | Status: DC

## 2015-12-21 MED ORDER — APIXABAN 5 MG PO TABS
5.0000 mg | ORAL_TABLET | Freq: Two times a day (BID) | ORAL | Status: DC
  Administered 2015-12-21 – 2015-12-23 (×5): 5 mg via ORAL
  Filled 2015-12-21 (×5): qty 1

## 2015-12-21 MED ORDER — ACETAMINOPHEN 650 MG RE SUPP
650.0000 mg | Freq: Four times a day (QID) | RECTAL | Status: DC | PRN
Start: 2015-12-21 — End: 2015-12-23

## 2015-12-21 MED ORDER — LEVOTHYROXINE SODIUM 75 MCG PO TABS
75.0000 ug | ORAL_TABLET | Freq: Every day | ORAL | Status: DC
  Administered 2015-12-21 – 2015-12-23 (×3): 75 ug via ORAL
  Filled 2015-12-21 (×2): qty 1
  Filled 2015-12-21: qty 3

## 2015-12-21 MED ORDER — TIOTROPIUM BROMIDE MONOHYDRATE 18 MCG IN CAPS
18.0000 ug | ORAL_CAPSULE | Freq: Every day | RESPIRATORY_TRACT | Status: DC
  Administered 2015-12-21 – 2015-12-23 (×3): 18 ug via RESPIRATORY_TRACT
  Filled 2015-12-21: qty 5

## 2015-12-21 NOTE — Progress Notes (Signed)
Admitted from ED, alert and oriented x 4.  Denies chest pains and shortness of breath.  Patient on IV Potassium infusion for repletion.  Discussed unit routines and initial plan of care.  Call bell provided and instructed to call for assistance.  Provider on call paged and made aware of admission.  Will continue to monitor and evaluate.

## 2015-12-21 NOTE — Progress Notes (Signed)
RT has set up patient CPAP on 10. Patient does not need any O2 bleed in. Patient states she is able to place herself on the CPAP when she is ready. RT will continue to monitor as needed.

## 2015-12-21 NOTE — H&P (Signed)
History and Physical    TABBETHA DELERME [id number] DOB: Jun 01, 1956 DOA: 12/20/2015  PCP: Nicoletta Dress, MD  Patient coming from: Home.  Chief Complaint: Shortness of breath and chest pain.  HPI: [name] is a 60 y.o. female with medical history significant of chemotherapy-induced cardiomyopathy last EF measured in January 2017 was 30-35%, OSA, diabetes mellitus type 2, hyperlipidemia, hypothyroidism, nonsustained VT and breast cancer in remission was recently admitted last month for GI bleed and had extensive workup including EGD colonoscopy and capsule endoscopy last week presents to the ER because of chest pain and shortness of breath. Patient started developing chest pain last midnight which woke her up from sleep around 2:30 AM patient took some nitroglycerin sublingual and chest pain subsided. Later in the day, when patient woke up at around 8:30 AM patient was short of breath and chest pain started recurring again late in the evening 5:30 PM and patient took 3 sublingual nitroglycerin despite the patient still had chest pain. Patient's chest pain is mostly around her AICD. Pressure-like nonradiating. In the ER patient's chest pain subsided without any intervention. Patient's chest x-ray shows congestion and patient's weight has gained more than 8 pounds from her baseline. Patient was given 80 mg of IV Lasix and admitted for CHF and further management of chest pain. Patient has had a cardiac cath in January 2017 which showed minimal CAD.   ED Course: Lasix 80 mg IV was given.  Review of Systems: As per HPI otherwise 10 point review of systems negative.    Past Medical History  Diagnosis Date  . 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 DYSLIPIDEMIA 05/01/2009  . OBESITY 07/24/2007  . OBSTRUCTIVE SLEEP APNEA 07/24/2007    with CPAP compliance  . RESTLESS LEG SYNDROME 07/24/2007  . SYSTOLIC HEART FAILURE, CHRONIC 01/05/2009    a. chemo induced cardiomyopathy b. cMRI (02/2009) EF 45% c. Myoview  05/2010: EF 59%, no ischemia d. RHC (01/2012) RA 20, RV 55/13/22, PA 56/34 (44), PCWP 28, Fick CO/CI: 3.1 / 1.5, PVR 5.3 WU, PA sat 42% & 48% e. ECHO (10/2012) EF 20-25%, diff HK, MV calcified annulus f. ECHO (03/2014) EF 30-35%, sev HK, inferior/inferoseptal walls, mild MR  . 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 Hypertension   . History of breast cancer 2006    with return in 2008, s/p mastectomy, XRT and chemotherapy  . Osteoarthritis   . Anxiety   . Depression     followed by a psychiatrist  . Asthma   . GERD (gastroesophageal reflux disease)   . Gastroparesis   . IBS (irritable bowel syndrome)   . Myocardial infarction ([place])   . ICD (implantable cardiac defibrillator) in place 11/10/2012    ICD (11/2012)  . COPD (chronic obstructive pulmonary disease) ([place])   . Persistent atrial fibrillation ([place]) 09/2014  . AICD (automatic cardioverter/defibrillator) present   . Presence of permanent cardiac pacemaker   . CHF (congestive heart failure) [facility])     Past Surgical History  Procedure Laterality Date  . Mastectomy      right  . Abdominal hysterectomy  1991  . Cardiac defibrillator placement  11/10/2012    SJM Fortify Assura VR implanted by Dr Rayann Heman for primary prevention  . Cholecystectomy    . Wrist surgery Bilateral   . Knee cartilage surgery    . Bi-ventricular implantable cardioverter defibrillator N/A 11/10/2012    SJM Forify Assura single chamber ICD  . Cardiac catheterization N/A 08/21/2015    Procedure: Right/Left Heart Cath and Coronary Angiography;  Surgeon: Jolaine Artist, MD;  Location: [place] [facility];  Service: Cardiovascular;  Laterality: N/A;  . Esophagogastroduodenoscopy N/A 11/16/2015    Procedure: ESOPHAGOGASTRODUODENOSCOPY (EGD);  Surgeon: Manus Gunning, MD;  Location: [place];  Service: Gastroenterology;  Laterality: N/A;  . Colonoscopy N/A 11/17/2015    Procedure: COLONOSCOPY;  Surgeon: Jerene Bears, MD;  Location: [facility] ENDOSCOPY;  Service:  Endoscopy;  Laterality: N/A;  . Givens capsule study N/A 12/11/2015    Procedure: GIVENS CAPSULE STUDY;  Surgeon: Manus Gunning, MD;  Location: [place];  Service: Gastroenterology;  Laterality: N/A;     reports that she quit smoking about 14 years ago. She has never used smokeless tobacco. She reports that she does not drink alcohol or use illicit drugs.  Allergies  Allergen Reactions  . Bee Venom Anaphylaxis  . Other Other (See Comments)    ANY TYPES OF METAL-CAUSES BLISTERS SKIN AND ITCHING   . Aspirin Hives  . Ciprofloxacin Hives  . Codeine Nausea Only    "can take ONLY if she eats with med"  . Naproxen Hives  . Sulfonamide Derivatives Hives  . Xarelto [Rivaroxaban] Hives  . Nsaids Itching and Rash  . Tape Rash    Also allergic to metal    Family History  Problem Relation Age of Onset  . Diabetes Mellitus II Mother   . Coronary artery disease Father     CABG x 3  . Breast cancer Cousin   . Breast cancer Cousin   . Uterine cancer Cousin   . Lung cancer Mother   . Congestive Heart Failure Mother   . Hypertension Father   . Diabetes Mellitus II Father     Prior to Admission medications   Medication Sig Start Date End Date Taking? Authorizing Provider  acetaminophen (TYLENOL ARTHRITIS PAIN) 650 MG CR tablet Take 1,300 mg by mouth every 8 (eight) hours as needed. pain   Yes Historical Provider, MD  albuterol (PROVENTIL HFA) 108 (90 BASE) MCG/ACT inhaler Inhale 2 puffs into the lungs every 6 (six) hours as needed. For shortness of breath   Yes Historical Provider, MD  ALPRAZolam Duanne Moron) 0.5 MG tablet Take 0.5 mg by mouth daily as needed for anxiety.  11/06/15  Yes Historical Provider, MD  amiodarone (PACERONE) 200 MG tablet Take 1 tablet (200 mg total) by mouth daily. 09/05/15  Yes Amy D Clegg, NP  apixaban (ELIQUIS) 5 MG TABS tablet Take 1 tablet (5 mg total) by mouth 2 (two) times daily. 11/18/15  Yes Mercy Riding, MD  atorvastatin (LIPITOR) 40 MG tablet Take 40  mg by mouth daily.     Yes Historical Provider, MD  busPIRone (BUSPAR) 30 MG tablet Take 30 mg by mouth 2 (two) times daily.    Yes Historical Provider, MD  carvedilol (COREG) 6.25 MG tablet Take 1 tablet (6.25 mg total) by mouth 2 (two) times daily with a meal. 11/18/15  Yes Mercy Riding, MD  digoxin (LANOXIN) 0.125 MG tablet Take 1 tablet (0.125 mg total) by mouth every other day. 06/28/15  Yes Jolaine Artist, MD  docusate sodium (COLACE) 100 MG capsule Take 3 capsules (300 mg total) by mouth at bedtime. 10/03/15  Yes Amy D Clegg, NP  DULoxetine (CYMBALTA) 30 MG capsule Take 30 mg by mouth daily.   Yes Amy A Moon, NP  furosemide (LASIX) 20 MG tablet 40 mg twice a day Patient taking differently: Take 40 mg by mouth 2 (two) times daily.  12/11/15  Yes Vena Rua, PA-C  Glucosamine 500 MG CAPS Take 1 capsule by mouth daily.     Yes Historical Provider, MD  HUMALOG KWIKPEN 100 UNIT/ML KiwkPen Inject 7-34 Units into the skin 3 (three) times daily. Sliding scale 10/30/15  Yes Historical Provider, MD  HUMALOG MIX 75/25 KWIKPEN (75-25) 100 UNIT/ML Kwikpen Inject 75 Units into the skin 2 (two) times daily. 11/21/15  Yes Historical Provider, MD  hydrOXYzine (ATARAX/VISTARIL) 50 MG tablet Take 50 mg by mouth every 6 (six) hours as needed for itching.   Yes Historical Provider, MD  ivabradine (CORLANOR) 5 MG TABS tablet Take 1 tablet (5 mg total) by mouth 2 (two) times daily with a meal. 08/08/15  Yes Amy D Clegg, NP  levothyroxine (SYNTHROID, LEVOTHROID) 75 MCG tablet Take 75 mcg by mouth daily before breakfast.    Yes Historical Provider, MD  Melatonin 1 MG TABS Take 0.5 mg by mouth at bedtime.   Yes Historical Provider, MD  metolazone (ZAROXOLYN) 2.5 MG tablet Take 2.5 mg by mouth as needed (for weight gain >3 lbs).   Yes Historical Provider, MD  mometasone (NASONEX) 50 MCG/ACT nasal spray Place 1 spray into both nostrils daily. allergies   Yes Historical Provider, MD  nitroGLYCERIN (NITROSTAT) 0.4 MG SL  tablet Place 1 tablet (0.4 mg total) under the tongue every 5 (five) minutes as needed. For chest pain 10/18/15  Yes Jolaine Artist, MD  pantoprazole (PROTONIX) 40 MG tablet Take 40 mg by mouth 2 (two) times daily.     Yes Historical Provider, MD  potassium chloride SA (K-DUR,KLOR-CON) 20 MEQ tablet Take 2 tablets (40 mEq total) by mouth 2 (two) times daily. 09/05/15  Yes Amy D Clegg, NP  ramipril (ALTACE) 2.5 MG capsule Take 1 capsule (2.5 mg total) by mouth daily. 11/18/15  Yes Mercy Riding, MD  rOPINIRole (REQUIP) 1 MG tablet Take 1 mg by mouth at bedtime.     Yes Historical Provider, MD  spironolactone (ALDACTONE) 25 MG tablet Take 25 mg by mouth daily.   Yes Historical Provider, MD  SUMAtriptan (IMITREX) 100 MG tablet Take 1 tablet by mouth daily as needed. 11/28/15  Yes Historical Provider, MD  tiotropium (SPIRIVA) 18 MCG inhalation capsule Place 18 mcg into inhaler and inhale daily.     Yes Historical Provider, MD  vitamin B-12 (CYANOCOBALAMIN) 1000 MCG tablet Take 1,000 mcg by mouth daily.   Yes Historical Provider, MD  vitamin C (ASCORBIC ACID) 500 MG tablet Take 500 mg by mouth daily.   Yes Historical Provider, MD  Insulin Pen Needle (B-D ULTRAFINE III SHORT PEN) 31G X 8 MM MISC 18/day as directed dx 250.00 08/01/11   Renato Shin, MD    Physical Exam: Filed Vitals:   12/21/15 0145 12/21/15 0200 12/21/15 0215 12/21/15 0301  BP: 111/57 118/63 112/64 109/50  Pulse: 81 85 86   Temp:    98.4 F (36.9 C)  TempSrc:    Oral  Resp: 17 16 16 18   Height:    5\' 5"  (1.651 m)  Weight:    222 lb 8 oz (100.925 kg)  SpO2: 98% 99% 96% 98%      Constitutional: Not in distress. Filed Vitals:   12/21/15 0145 12/21/15 0200 12/21/15 0215 12/21/15 0301  BP: 111/57 118/63 112/64 109/50  Pulse: 81 85 86   Temp:    98.4 F (36.9 C)  TempSrc:    Oral  Resp: 17 16 16 18   Height:    5\' 5"  (  1.651 m)  Weight:    222 lb 8 oz (100.925 kg)  SpO2: 98% 99% 96% 98%   Eyes: Anicteric no pallor. ENMT:  No discharge from the ears eyes nose and mouth. Neck: No mass felt. JVD elevated. Respiratory: No rhonchi or crepitations. Cardiovascular: S1-S2 heard. Abdomen: Soft nontender bowel sounds present. Musculoskeletal: Bilateral lower extremity edema. Skin: No rash. Neurologic: Alert awake oriented to time place and person. Moves all extremities. Psychiatric: Appears normal.   Labs on Admission: I have personally reviewed following labs and imaging studies  CBC:  Recent Labs Lab 12/19/15 1313 12/20/15 2134  WBC 8.2 8.5  NEUTROABS 6.0  --   HGB 10.4* 10.2*  HCT 31.5* 31.4*  MCV 89.0 91.5  PLT 287.0 123456   Basic Metabolic Panel:  Recent Labs Lab 12/20/15 2134  NA 136  K 3.0*  CL 99*  CO2 26  GLUCOSE 106*  BUN 13  CREATININE 1.18*  CALCIUM 9.2   GFR: Estimated Creatinine Clearance: 59.7 mL/min (by C-G formula based on Cr of 1.18). Liver Function Tests: No results for input(s): AST, ALT, ALKPHOS, BILITOT, PROT, ALBUMIN in the last 168 hours. No results for input(s): LIPASE, AMYLASE in the last 168 hours. No results for input(s): AMMONIA in the last 168 hours. Coagulation Profile: No results for input(s): INR, PROTIME in the last 168 hours. Cardiac Enzymes:  Recent Labs Lab 12/21/15 0027  TROPONINI <0.03   BNP (last 3 results) No results for input(s): PROBNP in the last 8760 hours. HbA1C: No results for input(s): HGBA1C in the last 72 hours. CBG: No results for input(s): GLUCAP in the last 168 hours. Lipid Profile: No results for input(s): CHOL, HDL, LDLCALC, TRIG, CHOLHDL, LDLDIRECT in the last 72 hours. Thyroid Function Tests: No results for input(s): TSH, T4TOTAL, FREET4, T3FREE, THYROIDAB in the last 72 hours. Anemia Panel:  Recent Labs  12/19/15 1313  FERRITIN 40.2  IRON 54   Urine analysis:    Component Value Date/Time   COLORURINE Straw 09/10/2014 2000   COLORURINE YELLOW 09/11/2009 0233   APPEARANCEUR Clear 09/10/2014 2000   APPEARANCEUR  HAZY* 09/11/2009 0233   LABSPEC 1.011 09/10/2014 2000   LABSPEC 1.015 09/11/2009 0233   PHURINE 5.0 09/10/2014 2000   PHURINE 5.0 09/11/2009 0233   GLUCOSEU Negative 09/10/2014 2000   GLUCOSEU 500* 09/11/2009 0233   HGBUR Negative 09/10/2014 2000   HGBUR NEGATIVE 09/11/2009 0233   BILIRUBINUR Negative 09/10/2014 2000   BILIRUBINUR NEGATIVE 09/11/2009 0233   KETONESUR Negative 09/10/2014 2000   KETONESUR NEGATIVE 09/11/2009 0233   PROTEINUR Negative 09/10/2014 2000   PROTEINUR NEGATIVE 09/11/2009 0233   UROBILINOGEN 0.2 09/11/2009 0233   NITRITE Negative 09/10/2014 2000   NITRITE NEGATIVE 09/11/2009 0233   LEUKOCYTESUR Negative 09/10/2014 2000   LEUKOCYTESUR  09/11/2009 0233    NEGATIVE MICROSCOPIC NOT DONE ON URINES WITH NEGATIVE PROTEIN, BLOOD, LEUKOCYTES, NITRITE, OR GLUCOSE <1000 mg/dL.   Sepsis Labs: @LABRCNTIP (procalcitonin:4,lacticidven:4) )No results found for this or any previous visit (from the past 240 hour(s)).   Radiological Exams on Admission: Dg Chest 2 View  12/20/2015  CLINICAL DATA:  Chest pain since this morning. Shortness of breath for 2.5 weeks. Bilateral ankle swelling for 3 weeks. History of right breast cancer. Former smoker. EXAM: CHEST  2 VIEW COMPARISON:  11/14/2015 FINDINGS: Cardiac pacemaker. Mild cardiac enlargement with mild pulmonary vascular congestion. Interstitial changes in the lungs suggest mild edema. No focal airspace disease or consolidation. No blunting of costophrenic angles. No pneumothorax. Hyperinflation suggesting underlying emphysematous  change. Postoperative right mastectomy. Calcification of the aorta. IMPRESSION: Cardiac enlargement with mild pulmonary vascular congestion interstitial edema. Electronically Signed   By: Lucienne Capers M.D.   On: 12/20/2015 22:18   Dg Abd 1 View  12/19/2015  CLINICAL DATA:  Evaluate for passage of a Givens capsule. EXAM: ABDOMEN - 1 VIEW COMPARISON:  None in PACs FINDINGS: The strain lateral aspects of  both flanks are excluded from the view. The inferior aspect of the rectum is excluded. No Givens capsule is observed. The bowel gas pattern is normal. There surgical clips in the gallbladder fossa. IMPRESSION: No Givens capsule is demonstrated in the field of view of the abdominal study today. Electronically Signed   By: David  Martinique M.D.   On: 12/19/2015 13:53    EKG: Independently reviewed. Sinus rhythm with RBBB.  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 Acute on chronic systolic heart failure last EF measured 30-35% in January 2017, chemotherapy-induced cardiomyopathy status post AICD placement - patient did receive 80 mg of IV Lasix in the ER. Patient usually takes 40 mg by mouth twice a day. Patient has gained 8 pounds from her baseline. For now I have placed patient on 60 mg IV every 12 hourly and continue patient's spironolactone. Closely follow intake output metabolic panel and daily weights. Patient is on ramipril, digoxin and Coreg. #2. Chest pain - with cardiac cath in January 2017 showing minimal CAD. For now we will cycle cardiac markers. Patient is on Apixaban. Continue Coreg and statins. When necessary nitroglycerin. Presently chest pain-free. #3. Recent GI bleed had EGD colonoscopy and last week patient also had capsule endoscopy - source of bleeding is not known. Hemoglobin is stable at this time. Closely follow CBC. #4. Increased creatinine from her baseline - patient's creatinine is mildly elevated from her baseline. If there is any further worsening may have told ramipril and spironolactone and digoxin. Check digoxin levels. #5. OSA - on CPAP at bedtime. #6. Hyperlipidemia on  statins. #7. Anemia from GI bleed. Hemoglobin appears to be stable. See #3. #8. Paroxysmal atrial fibrillation - presently in sinus rhythm.. Chads 2 vasc score is 5. Patient is on Apixaban Coreg and amiodarone and Ivabradine. #9. Breast cancer in remission. #10. Diabetes mellitus type 2 - patient's insulin dose was recently increased yesterday. Patient is on NovoLog 75/25 75 units twice daily from yesterday. Closely follow CBG with sliding scale coverage. #11. Hypertension - on Coreg and ramipril. See #4 with regarding to ramipril and renal failure. #12. Hypothyroidism on Synthroid check TSH.   DVT prophylaxis: Apixaban. Code Status: DO NOT RESUSCITATE.  Family Communication: No family at the bedside.  Disposition Plan: Home.  Consults called: None.  Admission status: Inpatient. Telemetry. Likely stay 2-3 days.    Rise Patience MD [facility] Pager [phone].  If 7PM-7AM, please contact night-coverage [email or web address] Password [email or web address]  12/21/2015, 4:31 AM

## 2015-12-21 NOTE — Progress Notes (Signed)
Patient seen and examined this morning, H&P reviewed. In brief, 60 yo F with 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    Acute on chronic systolic heart failure last EF measured 30-35% in January 2017, chemotherapy-induced cardiomyopathy status post AICD placement  - patient did receive 80 mg of IV Lasix in the ER.  - standing Lasix 60 BID IV, metolazone x 1 today - daily weights, strict I&O - heart failure to see   Chest pain  - with cardiac cath in January 2017 showing minimal CAD.  - likely related to fluid overload   Recent GI bleed had EGD colonoscopy and last week patient also had capsule endoscopy  - source of bleeding is not known. Hemoglobin is stable at this time while on Apixaban. - Closely follow CBC. Increased creatinine from her baseline  - patient's creatinine is mildly elevated from her baseline. - stable with diuresis, monitor  OSA  - on CPAP at bedtime.  Hyperlipidemia  - on statins.  Anemia from GI bleed  - Hemoglobin appears to be stable.  Paroxysmal atrial fibrillation  - presently in sinus rhythm.. Chads 2 vasc score is 5. Patient is on Apixaban Coreg and amiodarone and Ivabradine.  Breast cancer in remission. - a new lump appeared on exam on her left breast - unable to do [place] / mammography as inpatient, will need close follow up as outpatient in breast center  Diabetes mellitus type 2  - patient's insulin dose was recently increased yesterday. Patient is on NovoLog 75/25 75 units twice daily from yesterday. Closely follow CBG with sliding scale coverage.  Hypertension  - on  Coreg and ramipril.   Hypothyroidism  - on Synthroid check TSH.   Kriya Westra M. Cruzita Lederer, MD [facility] [phone]

## 2015-12-21 NOTE — Progress Notes (Signed)
Pt was asking for a [place] for her L breast, she found a lump on her L breast this am, thanks [facility] [place].

## 2015-12-21 NOTE — Consult Note (Signed)
Advanced Heart Failure Team Consult Note  Referring Physician: Dr Renne Crigler Primary Physician: [name], NP [facility]  Primary Cardiologist:  Dr Haroldine Laws  Oncologist:  Reason for Consultation: Heart Failure   HPI:   [name] is a 60 year old female with history of breast cancer, obesity, HTN, DM, IBS, HLD, OSA, chemo induced cardiomyopathy s/p ICD, chronic systolic HF, and GI bleed 11/2015. Had EGD April 2017with gastric, duodenal , and colon polyps. She was switched from xarelto to eliquis. In May 2017 capsule enteroscopy showed multiple polyps and small AVMs.   Followed closely in the HF clinic and was last seen February 28th. She was stable on lasix 40 mg twice a day + metolazone as needed. Weight at that time was 221 pounds.    Admitted earlier this morning with increased dyspnea and chest pain. Weight at home had gone up from 217 to 228 pounds despite extra lasix and metolazone. Given IV lasix in the ED. CP resolved without intervention. CXR with pulmonary vascular congestion. Pertinent admission labs include: BNP 121, Creatinine 1.1, K 3.2, hgb 10.4, and CEs negative.   Cardiac Studies.  CPX 2017  Peak VO2: 12.7 (73% predicted peak VO2) VE/VCO2 slope: 32.3 OUES: 1.62 Peak RER: 1.08 Exercise testing with gas exchange demonstrates a mild to moderate functional impairment when compared to matched sedentary norms. At peak exercise, patient appears to have mild circulatory limitation, Her body habitus and related restrictive lung physiology are also likely contributing to her exercise intolerance. Chronotropic incompetence was also present.  RHC/LCH 08/20/2014   Mid Cx lesion, 30% stenosed.  3rd Mrg lesion, 30% stenosed. Findings: RA = 12 RV = 55/6/12 PA = 54/21 (34) PCW = 22 Ao 93/58 (71) LV 106/8/26 Fick cardiac output/index = 4.2/2.1 PVR = 2.8 SVR = 1115 FA sat = 93% PA sat = 54%, 53% Assessment: 1. Very mild CAD 2. Mild to moderately  increased filling pressures 3. Severe LV dysfunction due to NICM EF 20%medications.   Review of Systems: [y] = yes, [ ]  = no   General: Weight gain [Y ]; Weight loss [ ] ; Anorexia [ ] ; Fatigue [ Y]; Fever [ ] ; Chills [ ] ; Weakness [Y ]  Cardiac: Chest pain/pressure [ ] ; Resting SOB [ ] ; Exertional SOB [ ] ; Orthopnea [ ] ; Pedal Edema [ ] ; Palpitations [ ] ; Syncope [ ] ; Presyncope [ ] ; Paroxysmal nocturnal dyspnea[ ]   Pulmonary: Cough Jazmín.Cullens ]; Wheezing[ ] ; Hemoptysis[ ] ; Sputum [ ] ; Snoring [ ]   GI: Vomiting[ ] ; Dysphagia[ ] ; Melena[ ] ; Hematochezia [ ] ; Heartburn[ ] ; Abdominal pain [ ] ; Constipation [ ] ; Diarrhea [ ] ; BRBPR [ ]   GU: Hematuria[ ] ; Dysuria [ ] ; Nocturia[ ]   Vascular: Pain in legs with walking [ ] ; Pain in feet with lying flat [ ] ; Non-healing sores [ ] ; Stroke [ ] ; TIA [ ] ; Slurred speech [ ] ;  Neuro: Headaches[ ] ; Vertigo[ ] ; Seizures[ ] ; Paresthesias[ ] ;Blurred vision [ ] ; Diplopia [ ] ; Vision changes [ ]   Ortho/Skin: Arthritis [ ] ; Joint pain [ ] ; Muscle pain [ ] ; Joint swelling [ ] ; Back Pain [ ] ; Rash [ ]   Psych: Depression[ ] ; Anxiety[ ]   Heme: Bleeding problems [ ] ; Clotting disorders [ ] ; Anemia [Y ]  Endocrine: Diabetes [Y ]; Thyroid dysfunction[ ]   Home Medications Prior to Admission medications   Medication Sig Start Date End Date Taking? Authorizing Provider  acetaminophen (TYLENOL ARTHRITIS PAIN) 650 MG CR tablet Take 1,300 mg by mouth every 8 (eight) hours  as needed. pain   Yes Historical Provider, MD  albuterol (PROVENTIL HFA) 108 (90 BASE) MCG/ACT inhaler Inhale 2 puffs into the lungs every 6 (six) hours as needed. For shortness of breath   Yes Historical Provider, MD  ALPRAZolam Duanne Moron) 0.5 MG tablet Take 0.5 mg by mouth daily as needed for anxiety.  11/06/15  Yes Historical Provider, MD  amiodarone (PACERONE) 200 MG tablet Take 1 tablet (200 mg total) by mouth daily. 09/05/15  Yes Amy D Clegg, NP  apixaban (ELIQUIS) 5 MG TABS tablet Take 1 tablet (5 mg total) by  mouth 2 (two) times daily. 11/18/15  Yes Mercy Riding, MD  atorvastatin (LIPITOR) 40 MG tablet Take 40 mg by mouth daily.     Yes Historical Provider, MD  busPIRone (BUSPAR) 30 MG tablet Take 30 mg by mouth 2 (two) times daily.    Yes Historical Provider, MD  carvedilol (COREG) 6.25 MG tablet Take 1 tablet (6.25 mg total) by mouth 2 (two) times daily with a meal. 11/18/15  Yes Mercy Riding, MD  digoxin (LANOXIN) 0.125 MG tablet Take 1 tablet (0.125 mg total) by mouth every other day. 06/28/15  Yes Jolaine Artist, MD  docusate sodium (COLACE) 100 MG capsule Take 3 capsules (300 mg total) by mouth at bedtime. 10/03/15  Yes Amy D Clegg, NP  DULoxetine (CYMBALTA) 30 MG capsule Take 30 mg by mouth daily.   Yes Amy A Moon, NP  furosemide (LASIX) 20 MG tablet 40 mg twice a day Patient taking differently: Take 40 mg by mouth 2 (two) times daily.  12/11/15  Yes Vena Rua, PA-C  Glucosamine 500 MG CAPS Take 1 capsule by mouth daily.     Yes Historical Provider, MD  HUMALOG KWIKPEN 100 UNIT/ML KiwkPen Inject 7-34 Units into the skin 3 (three) times daily. Sliding scale 10/30/15  Yes Historical Provider, MD  HUMALOG MIX 75/25 KWIKPEN (75-25) 100 UNIT/ML Kwikpen Inject 75 Units into the skin 2 (two) times daily. 11/21/15  Yes Historical Provider, MD  hydrOXYzine (ATARAX/VISTARIL) 50 MG tablet Take 50 mg by mouth every 6 (six) hours as needed for itching.   Yes Historical Provider, MD  ivabradine (CORLANOR) 5 MG TABS tablet Take 1 tablet (5 mg total) by mouth 2 (two) times daily with a meal. 08/08/15  Yes Amy D Clegg, NP  levothyroxine (SYNTHROID, LEVOTHROID) 75 MCG tablet Take 75 mcg by mouth daily before breakfast.    Yes Historical Provider, MD  Melatonin 1 MG TABS Take 0.5 mg by mouth at bedtime.   Yes Historical Provider, MD  metolazone (ZAROXOLYN) 2.5 MG tablet Take 2.5 mg by mouth as needed (for weight gain >3 lbs).   Yes Historical Provider, MD  mometasone (NASONEX) 50 MCG/ACT nasal spray Place 1 spray  into both nostrils daily. allergies   Yes Historical Provider, MD  nitroGLYCERIN (NITROSTAT) 0.4 MG SL tablet Place 1 tablet (0.4 mg total) under the tongue every 5 (five) minutes as needed. For chest pain 10/18/15  Yes Jolaine Artist, MD  pantoprazole (PROTONIX) 40 MG tablet Take 40 mg by mouth 2 (two) times daily.     Yes Historical Provider, MD  potassium chloride SA (K-DUR,KLOR-CON) 20 MEQ tablet Take 2 tablets (40 mEq total) by mouth 2 (two) times daily. 09/05/15  Yes Amy D Clegg, NP  ramipril (ALTACE) 2.5 MG capsule Take 1 capsule (2.5 mg total) by mouth daily. 11/18/15  Yes Mercy Riding, MD  rOPINIRole (REQUIP) 1 MG tablet Take 1  mg by mouth at bedtime.     Yes Historical Provider, MD  spironolactone (ALDACTONE) 25 MG tablet Take 25 mg by mouth daily.   Yes Historical Provider, MD  SUMAtriptan (IMITREX) 100 MG tablet Take 1 tablet by mouth daily as needed. 11/28/15  Yes Historical Provider, MD  tiotropium (SPIRIVA) 18 MCG inhalation capsule Place 18 mcg into inhaler and inhale daily.     Yes Historical Provider, MD  vitamin B-12 (CYANOCOBALAMIN) 1000 MCG tablet Take 1,000 mcg by mouth daily.   Yes Historical Provider, MD  vitamin C (ASCORBIC ACID) 500 MG tablet Take 500 mg by mouth daily.   Yes Historical Provider, MD  Insulin Pen Needle (B-D ULTRAFINE III SHORT PEN) 31G X 8 MM MISC 18/day as directed dx 250.00 08/01/11   Renato Shin, MD    Past Medical History: Past Medical History  Diagnosis Date  . 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 DYSLIPIDEMIA 05/01/2009  . OBESITY 07/24/2007  . OBSTRUCTIVE SLEEP APNEA 07/24/2007    with CPAP compliance  . RESTLESS LEG SYNDROME 07/24/2007  . SYSTOLIC HEART FAILURE, CHRONIC 01/05/2009    a. chemo induced cardiomyopathy b. cMRI (02/2009) EF 45% c. Myoview 05/2010: EF 59%, no ischemia d. RHC (01/2012) RA 20, RV 55/13/22, PA 56/34 (44), PCWP 28, Fick CO/CI: 3.1 / 1.5, PVR 5.3 WU, PA sat 42% & 48% e. ECHO (10/2012) EF 20-25%, diff HK, MV calcified annulus f. ECHO (03/2014) EF  30-35%, sev HK, inferior/inferoseptal walls, mild MR  . 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 Hypertension   . History of breast cancer 2006    with return in 2008, s/p mastectomy, XRT and chemotherapy  . Osteoarthritis   . Anxiety   . Depression     followed by a psychiatrist  . Asthma   . GERD (gastroesophageal reflux disease)   . Gastroparesis   . IBS (irritable bowel syndrome)   . Myocardial infarction ([place])   . ICD (implantable cardiac defibrillator) in place 11/10/2012    ICD (11/2012)  . COPD (chronic obstructive pulmonary disease) ([place])   . Persistent atrial fibrillation ([place]) 09/2014  . AICD (automatic cardioverter/defibrillator) present   . Presence of permanent cardiac pacemaker   . CHF (congestive heart failure) ([place])     Past Surgical History: Past Surgical History  Procedure Laterality Date  . Mastectomy      right  . Abdominal hysterectomy  1991  . Cardiac defibrillator placement  11/10/2012    SJM Fortify Assura VR implanted by Dr Rayann Heman for primary prevention  . Cholecystectomy    . Wrist surgery Bilateral   . Knee cartilage surgery    . Bi-ventricular implantable cardioverter defibrillator N/A 11/10/2012    SJM Forify Assura single chamber ICD  . Cardiac catheterization N/A 08/21/2015    Procedure: Right/Left Heart Cath and Coronary Angiography;  Surgeon: Jolaine Artist, MD;  Location: [place] [facility];  Service: Cardiovascular;  Laterality: N/A;  . Esophagogastroduodenoscopy N/A 11/16/2015    Procedure: ESOPHAGOGASTRODUODENOSCOPY (EGD);  Surgeon: Manus Gunning, MD;  Location: [facility];  Service: Gastroenterology;  Laterality: N/A;  . Colonoscopy N/A 11/17/2015    Procedure: COLONOSCOPY;  Surgeon: Jerene Bears, MD;  Location: [facility] END[facility]COPY;  Service: Endoscopy;  Laterality: N/A;  . Givens capsule study N/A 12/11/2015    Procedure: GIVENS CAPSULE STUDY;  Surgeon: Manus Gunning, MD;  Location: [place];  Service: Gastroenterology;   Laterality: N/A;    Family History: Family History  Problem Relation Age of Onset  . Diabetes Mellitus II  Mother   . Coronary artery disease Father     CABG x 3  . Breast cancer Cousin   . Breast cancer Cousin   . Uterine cancer Cousin   . Lung cancer Mother   . Congestive Heart Failure Mother   . Hypertension Father   . Diabetes Mellitus II Father     Social History: Social History   Social History  . Marital Status: Divorced    Spouse Name: N/A  . Number of Children: N/A  . Years of Education: N/A   Occupational History  . Disabled    Social History Main Topics  . Smoking status: Former Smoker    Quit date: 04/12/2001  . Smokeless tobacco: Never Used  . Alcohol Use: No  . Drug Use: No  . Sexual Activity: Not Currently   Other Topics Concern  . None   Social History Narrative   Lives with mother and brother Philis Nettle          Allergies:  Allergies  Allergen Reactions  . Bee Venom Anaphylaxis  . Other Other (See Comments)    ANY TYPES OF METAL-CAUSES BLISTERS SKIN AND ITCHING   . Aspirin Hives  . Ciprofloxacin Hives  . Codeine Nausea Only    "can take ONLY if she eats with med"  . Naproxen Hives  . Sulfonamide Derivatives Hives  . Xarelto [Rivaroxaban] Hives  . Nsaids Itching and Rash  . Tape Rash    Also allergic to metal    Objective:    Vital Signs:   Temp:  [97.5 F (36.4 C)-98.4 F (36.9 C)] 97.6 F (36.4 C) (05/18 0806) Pulse Rate:  [81-91] 84 (05/18 0806) Resp:  [15-25] 20 (05/18 0806) BP: (104-123)/(50-95) 104/58 mmHg (05/18 0806) SpO2:  [94 %-99 %] 95 % (05/18 0833) Weight:  [222 lb 8 oz (100.925 kg)-228 lb (103.42 kg)] 222 lb 8 oz (100.925 kg) (05/18 0301) Last BM Date: 12/19/15  Weight change: Filed Weights   12/20/15 2132 12/21/15 0049 12/21/15 0301  Weight: 228 lb (103.42 kg) 225 lb 9.6 oz (102.331 kg) 222 lb 8 oz (100.925 kg)    Intake/Output:   Intake/Output Summary (Last 24 hours) at 12/21/15 1144 Last data  filed at 12/21/15 0810  Gross per 24 hour  Intake    300 ml  Output   3350 ml  Net  -3050 ml     Physical Exam: General:  Well appearing. No resp difficulty. Sitting on the side of the bed.  HEENT: normal Neck: supple. JVP to jaw.  . Carotids 2+ bilat; no bruits. No lymphadenopathy or thryomegaly appreciated. Cor: PMI nondisplaced. Regular rate & rhythm. No rubs, gallops or murmurs. Lungs: clear Abdomen: obese, soft, nontender, nondistended. No hepatosplenomegaly. No bruits or masses. Good bowel sounds. Extremities: no cyanosis, clubbing, rash, R and LLE 2+ edema Neuro: alert & orientedx3, cranial nerves grossly intact. moves all 4 extremities w/o difficulty. Affect pleasant  Telemetry: NSR  Labs: Basic Metabolic Panel:  Recent Labs Lab 12/20/15 2134 12/21/15 0511  NA 136 139  K 3.0* 3.2*  CL 99* 99*  CO2 26 28  GLUCOSE 106* 171*  BUN 13 11  CREATININE 1.18* 1.10*  CALCIUM 9.2 9.1    Liver Function Tests:  Recent Labs Lab 12/21/15 0511  AST 23  ALT 21  ALKPHOS 58  BILITOT 1.2  PROT 7.4  ALBUMIN 3.3*   No results for input(s): LIPASE, AMYLASE in the last 168 hours. No results for input(s): AMMONIA in the last 168  hours.  CBC:  Recent Labs Lab 12/19/15 1313 12/20/15 2134 12/21/15 0511  WBC 8.2 8.5 8.2  NEUTROABS 6.0  --  5.8  HGB 10.4* 10.2* 10.4*  HCT 31.5* 31.4* 33.6*  MCV 89.0 91.5 92.6  PLT 287.0 304 319    Cardiac Enzymes:  Recent Labs Lab 12/21/15 0027 12/21/15 0511  TROPONINI <0.03 0.03    BNP: BNP (last 3 results)  Recent Labs  08/19/15 2035 11/14/15 1240 12/21/15 0027  BNP 142.7* 216.4* 121.7*    ProBNP (last 3 results) No results for input(s): PROBNP in the last 8760 hours.   CBG:  Recent Labs Lab 12/21/15 0652  GLUCAP 168*    Coagulation Studies: No results for input(s): LABPROT, INR in the last 72 hours.  Other results: EKG:  Imaging: Dg Chest 2 View  12/20/2015  CLINICAL DATA:  Chest pain since this  morning. Shortness of breath for 2.5 weeks. Bilateral ankle swelling for 3 weeks. History of right breast cancer. Former smoker. EXAM: CHEST  2 VIEW COMPARISON:  11/14/2015 FINDINGS: Cardiac pacemaker. Mild cardiac enlargement with mild pulmonary vascular congestion. Interstitial changes in the lungs suggest mild edema. No focal airspace disease or consolidation. No blunting of costophrenic angles. No pneumothorax. Hyperinflation suggesting underlying emphysematous change. Postoperative right mastectomy. Calcification of the aorta. IMPRESSION: Cardiac enlargement with mild pulmonary vascular congestion interstitial edema. Electronically Signed   By: Lucienne Capers M.D.   On: 12/20/2015 22:18   Dg Abd 1 View  12/19/2015  CLINICAL DATA:  Evaluate for passage of a Givens capsule. EXAM: ABDOMEN - 1 VIEW COMPARISON:  None in PACs FINDINGS: The strain lateral aspects of both flanks are excluded from the view. The inferior aspect of the rectum is excluded. No Givens capsule is observed. The bowel gas pattern is normal. There surgical clips in the gallbladder fossa. IMPRESSION: No Givens capsule is demonstrated in the field of view of the abdominal study today. Electronically Signed   By: David  Martinique M.D.   On: 12/19/2015 13:53      Medications:     Current Medications: . amiodarone  200 mg Oral Daily  . apixaban  5 mg Oral BID  . atorvastatin  40 mg Oral q1800  . busPIRone  30 mg Oral BID  . carvedilol  6.25 mg Oral BID WC  . [START ON 12/22/2015] digoxin  0.125 mg Oral QODAY  . docusate sodium  300 mg Oral QHS  . DULoxetine  30 mg Oral Daily  . fluticasone  2 spray Each Nare Daily  . furosemide  60 mg Intravenous BID  . insulin aspart  0-9 Units Subcutaneous TID WC  . insulin aspart protamine- aspart  75 Units Subcutaneous BID WC  . ivabradine  5 mg Oral BID WC  . levothyroxine  75 mcg Oral QAC breakfast  . pantoprazole  40 mg Oral BID  . potassium chloride SA  40 mEq Oral BID  . ramipril   2.5 mg Oral Daily  . rOPINIRole  1 mg Oral QHS  . sodium chloride flush  3 mL Intravenous Q12H  . sodium chloride flush  3 mL Intravenous Q12H  . spironolactone  25 mg Oral Daily  . tiotropium  18 mcg Inhalation Daily  . vitamin B-12  1,000 mcg Oral Daily  . vitamin C  500 mg Oral Daily     Infusions:      Assessment/Plan/Discussion  Mrs Eggenberger is a 60 year old with NICM--> chemo induced admitted with a/c systolic heart failure.  1. 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 Has St Jude ICD.  NYHA III. Volume status elevated. CXR with vascular congestion. Continue to diurese with IV lasix. Likely has 10-15 pounds up.Supplement potassium.  Continue current dose of ramipril , coreg, ivabradine, and spironolactone.  2. PAF- Maintaining NSR. On amio 200 mg daily. On eliquis 5 mg twice a day 3. H/O GI bleed- with multiple polyps . Has been followed by GI in the community. Hemoglobin stable. Continue anticoagulation with eliquis.  4.  OSA- Continue CPAP nightly 5. DM- On insulin. Per primary team.  6. History of R Breast Cancer- Earlier today she found L Breast Lump- Ultrasound pending.   Will schedule follow up in HF clinic.   Length of Stay: 0  Amy Clegg NP-C  12/21/2015, 11:44 AM  Advanced Heart Failure Team Pager [phone] (M-F; 7a - 4p)  Please contact [facility] for night-coverage after hours (4p -7a ) and weekends on [email or web address]  Patient seen with NP, agree with the above note.  Nonischemic cardiomyopathy, EF 30-35% on last echo 1/17.  She is in NSR on amiodarone. She is tolerating Eliquis so far with no recurrent overt GI bleeding.  Hemoglobin stable.  She is quite volume overloaded on exam despite periodic metolazone use at home.  Agree with Lasix 60 mg IV bid and got dose of metolazone today.  Follow output and replace K.  Suspect she will need 2 or so more days of IV diuresis.  May need to go home on torsemide rather than Lasix.  Will continue  other cardiomyopathy meds, digoxin level ok.   Loralie Champagne 12/21/2015 12:29 PM

## 2015-12-22 ENCOUNTER — Other Ambulatory Visit: Payer: Self-pay

## 2015-12-22 LAB — BASIC METABOLIC PANEL
ANION GAP: 13 (ref 5–15)
BUN: 14 mg/dL (ref 6–20)
CHLORIDE: 94 mmol/L — AB (ref 101–111)
CO2: 31 mmol/L (ref 22–32)
Calcium: 9.7 mg/dL (ref 8.9–10.3)
Creatinine, Ser: 1.11 mg/dL — ABNORMAL HIGH (ref 0.44–1.00)
GFR calc Af Amer: >60 mL/min (ref >60)
GFR, EST NON AFRICAN AMERICAN: 53 mL/min — AB (ref >60)
GLUCOSE: 128 mg/dL — AB (ref 65–99)
POTASSIUM: 3.1 mmol/L — AB (ref 3.5–5.1)
Sodium: 138 mmol/L (ref 135–145)

## 2015-12-22 LAB — GLUCOSE, CAPILLARY
GLUCOSE-CAPILLARY: 171 mg/dL — AB (ref 65–99)
GLUCOSE-CAPILLARY: 182 mg/dL — AB (ref 65–99)
GLUCOSE-CAPILLARY: 127 mg/dL — AB (ref 65–99)
Glucose-Capillary: 221 mg/dL — ABNORMAL HIGH (ref 65–99)

## 2015-12-22 MED ORDER — FUROSEMIDE 40 MG PO TABS
40.0000 mg | ORAL_TABLET | Freq: Two times a day (BID) | ORAL | Status: DC
  Administered 2015-12-23: 40 mg via ORAL
  Filled 2015-12-22: qty 1

## 2015-12-22 MED ORDER — POTASSIUM CHLORIDE CRYS ER 20 MEQ PO TBCR
30.0000 meq | EXTENDED_RELEASE_TABLET | Freq: Once | ORAL | Status: AC
  Administered 2015-12-22: 30 meq via ORAL
  Filled 2015-12-22: qty 1

## 2015-12-22 NOTE — Evaluation (Signed)
Physical Therapy Evaluation Patient Details Name: [name] MRN: [id number] DOB: Jul 20, 1956 Today's Date: 12/22/2015   History of Present Illness  [name] is a 60 y.o. female presenting with chest pain and worsening shortness of breath.  PMH includes right breast cancer s/p lumpectomy, chemoradiatherapy 2005, radiation in 2008, obesity, HTN, DM, IBS, HLD, OA, anxiety, depression, MI, GERD, COPD, Afib, OSA on CPAP nightly, chemo induced cardiomyopathy s/p St. Jude's ICD in 2014, HFrEF with LVEF 30-35% on Echo from 08/2015,  Afib.  Dx of CHF.   Clinical Impression  Pt is independent with mobility. She ambulated 400' without an assistive device, no loss of balance. SaO2 93% on RA with walking, HR 100. Pt is safe to DC home from PT standpoint.  No further PT indicated. Pt signing off.     Follow Up Recommendations No PT follow up    Equipment Recommendations  None recommended by PT    Recommendations for Other Services       Precautions / Restrictions Precautions Precautions: Fall Precaution Comments: 4 falls in past 6 months, tripped over stuff brother left on floor, she moved out of his home 4 days ago Restrictions Weight Bearing Restrictions: No      Mobility  Bed Mobility Overal bed mobility: Independent                Transfers Overall transfer level: Independent                  Ambulation/Gait Ambulation/Gait assistance: Independent Ambulation Distance (Feet): 400 Feet Assistive device: None Gait Pattern/deviations: WFL(Within Functional Limits)   Gait velocity interpretation: at or above normal speed for age/gender General Gait Details: steady, no LOB, HR 100 walking, SaO2 93% on RA walking, 1/4 dyspnea  Stairs            Wheelchair Mobility    Modified Rankin (Stroke Patients Only)       Balance Overall balance assessment: Independent;History of Falls                                            Pertinent Vitals/Pain Pain Assessment: No/denies pain    Home Living Family/patient expects to be discharged to:: Private residence Living Arrangements: Alone Available Help at Discharge: Family;Available PRN/intermittently   Home Access: Stairs to enter Entrance Stairs-Rails: Right;Left;Can reach both Entrance Stairs-Number of Steps: 4 Home Layout: One level Home Equipment: Walker - 2 wheels;Walker - 4 wheels;Wheelchair - manual;Cane - single point;Shower seat;Bedside commode;Toilet riser;Adaptive equipment      Prior Function Level of Independence: Independent with assistive device(s)         Comments: uses SPC when knees bother her, independent bathing and dressing     Hand Dominance        Extremity/Trunk Assessment   Upper Extremity Assessment: Overall WFL for tasks assessed           Lower Extremity Assessment: RLE deficits/detail;LLE deficits/detail RLE Deficits / Details: decreased to light touch B feet, strength WFL BLEs    Cervical / Trunk Assessment: Normal  Communication   Communication: No difficulties  Cognition Arousal/Alertness: Awake/alert Behavior During Therapy: WFL for tasks assessed/performed Overall Cognitive Status: Within Functional Limits for tasks assessed                      General Comments      Exercises  Assessment/Plan    PT Assessment Patent does not need any further PT services  PT Diagnosis     PT Problem List    PT Treatment Interventions     PT Goals (Current goals can be found in the Care Plan section) Acute Rehab PT Goals PT Goal Formulation: All assessment and education complete, DC therapy    Frequency     Barriers to discharge        Co-evaluation               End of Session   Activity Tolerance: Patient tolerated treatment well Patient left: in chair;with call bell/phone within reach Nurse Communication: Mobility status         Time: [id number] PT Time  Calculation (min) (ACUTE ONLY): 15 min   Charges:   PT Evaluation $PT Eval Low Complexity: 1 Procedure     PT G CodesBlondell Reveal Kistler 12/22/2015, 11:13 AM [phone]

## 2015-12-22 NOTE — Telephone Encounter (Signed)
Patient is hospitalized at this time for CHF. MD aware.

## 2015-12-22 NOTE — Progress Notes (Addendum)
Advanced Heart Failure Rounding Note  Referring Physician: Dr Renne Crigler Primary Physician: [name], NP [facility]  Primary Cardiologist: Dr Haroldine Laws   Subjective:    Admitted 12/21/15 with increased dyspnea and chest pain.   Feeling much better this morning.  States she was a little bit lightheaded when she got done walking halls this morning, with mild SOB, but nothing compared to when she came in.  Concerned about lump on breast.   Out 7 L and down 7 lbs from yesterday on 60 mg IV lasix BID with 2.5 mg metolazone.   Objective:   Weight Range: 215 lb 14.4 oz (97.932 kg) Body mass index is 35.93 kg/(m^2).   Vital Signs:   Temp:  [97.4 F (36.3 C)-98 F (36.7 C)] 97.5 F (36.4 C) (05/19 0526) Pulse Rate:  [82-91] 82 (05/19 0526) Resp:  [18-20] 18 (05/19 0526) BP: (102-128)/(47-60) 108/49 mmHg (05/19 0526) SpO2:  [97 %-100 %] 97 % (05/19 0526) Weight:  [215 lb 14.4 oz (97.932 kg)] 215 lb 14.4 oz (97.932 kg) (05/19 0526) Last BM Date: 12/19/15  Weight change: Filed Weights   12/21/15 0049 12/21/15 0301 12/22/15 0526  Weight: 225 lb 9.6 oz (102.331 kg) 222 lb 8 oz (100.925 kg) 215 lb 14.4 oz (97.932 kg)    Intake/Output:   Intake/Output Summary (Last 24 hours) at 12/22/15 0914 Last data filed at 12/22/15 0858  Gross per 24 hour  Intake    600 ml  Output   5550 ml  Net  -4950 ml     Physical Exam: General: Well appearing. No resp difficulty. Sitting on the side of the bed.  HEENT: normal Neck: supple. JVP 8-9 . Carotids 2+ bilat; no bruits. No thyromegaly or nodule note.  Cor: PMI nondisplaced. RRR, No M/G/R Lungs: CTAB, normal effort.  Abdomen: obese, soft, NT, ND, no HSM. No bruits or masses. +BS  Extremities: no cyanosis, clubbing, rash, R and LLE 2+ edema Neuro: alert & orientedx3, cranial nerves grossly intact. moves all 4 extremities w/o difficulty. Affect pleasant  Telemetry: NSR   Labs: CBC  Recent Labs  12/19/15 1313  12/20/15 2134 12/21/15 0511  WBC 8.2 8.5 8.2  NEUTROABS 6.0  --  5.8  HGB 10.4* 10.2* 10.4*  HCT 31.5* 31.4* 33.6*  MCV 89.0 91.5 92.6  PLT 287.0 304 99991111   Basic Metabolic Panel  Recent Labs  12/21/15 0511 12/22/15 0428  NA 139 138  K 3.2* 3.1*  CL 99* 94*  CO2 28 31  GLUCOSE 171* 128*  BUN 11 14  CREATININE 1.10* 1.11*  CALCIUM 9.1 9.7   Liver Function Tests  Recent Labs  12/21/15 0511  AST 23  ALT 21  ALKPHOS 58  BILITOT 1.2  PROT 7.4  ALBUMIN 3.3*   No results for input(s): LIPASE, AMYLASE in the last 72 hours. Cardiac Enzymes  Recent Labs  12/21/15 0511 12/21/15 1055 12/21/15 1613  TROPONINI 0.03 <0.03 <0.03    BNP: BNP (last 3 results)  Recent Labs  08/19/15 2035 11/14/15 1240 12/21/15 0027  BNP 142.7* 216.4* 121.7*    ProBNP (last 3 results) No results for input(s): PROBNP in the last 8760 hours.   D-Dimer No results for input(s): DDIMER in the last 72 hours. Hemoglobin A1C No results for input(s): HGBA1C in the last 72 hours. Fasting Lipid Panel No results for input(s): CHOL, HDL, LDLCALC, TRIG, CHOLHDL, LDLDIRECT in the last 72 hours. Thyroid Function Tests  Recent Labs  12/21/15 0511  TSH  4.005    Other results:     Imaging/Studies:  Dg Chest 2 View  12/20/2015  CLINICAL DATA:  Chest pain since this morning. Shortness of breath for 2.5 weeks. Bilateral ankle swelling for 3 weeks. History of right breast cancer. Former smoker. EXAM: CHEST  2 VIEW COMPARISON:  11/14/2015 FINDINGS: Cardiac pacemaker. Mild cardiac enlargement with mild pulmonary vascular congestion. Interstitial changes in the lungs suggest mild edema. No focal airspace disease or consolidation. No blunting of costophrenic angles. No pneumothorax. Hyperinflation suggesting underlying emphysematous change. Postoperative right mastectomy. Calcification of the aorta. IMPRESSION: Cardiac enlargement with mild pulmonary vascular congestion interstitial edema.  Electronically Signed   By: Lucienne Capers M.D.   On: 12/20/2015 22:18     Latest Echo  Latest Cath   Medications:     Scheduled Medications: . amiodarone  200 mg Oral Daily  . apixaban  5 mg Oral BID  . atorvastatin  40 mg Oral q1800  . busPIRone  30 mg Oral BID  . carvedilol  6.25 mg Oral BID WC  . digoxin  0.125 mg Oral QODAY  . docusate sodium  300 mg Oral QHS  . DULoxetine  30 mg Oral Daily  . fluticasone  2 spray Each Nare Daily  . furosemide  60 mg Intravenous BID  . insulin aspart  0-9 Units Subcutaneous TID WC  . insulin aspart protamine- aspart  75 Units Subcutaneous BID WC  . ivabradine  5 mg Oral BID WC  . levothyroxine  75 mcg Oral QAC breakfast  . pantoprazole  40 mg Oral BID  . potassium chloride SA  40 mEq Oral BID  . ramipril  2.5 mg Oral Daily  . rOPINIRole  1 mg Oral QHS  . sodium chloride flush  3 mL Intravenous Q12H  . sodium chloride flush  3 mL Intravenous Q12H  . spironolactone  25 mg Oral Daily  . tiotropium  18 mcg Inhalation Daily  . vitamin B-12  1,000 mcg Oral Daily  . vitamin C  500 mg Oral Daily     Infusions:     PRN Medications:  acetaminophen **OR** acetaminophen, albuterol, ALPRAZolam, hydrOXYzine, nitroGLYCERIN, ondansetron **OR** ondansetron (ZOFRAN) IV   Assessment   1. 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 Has [employer] ICD.  2. PAF 3. H/O GI bleed- with multiple polyps 4.OSA 5. DM 6. History of R Breast Cancer- PTA she found L Breast Lump- Ultrasound pending.  7. hypokalemia  Plan    Volume status remains slightly elevated. Continue IV lasix one more day.  Plan on transition to po in am and possible d/c tomorrow.  Will schedule follow up in HF clinic.   Would send out on previous dose of lasix at 40mg  BID and potassium 40 meq. With metolazone as needed for weight gain. Should take extra 40 meq of potassium on metolazone days.   She is maintaining NSR on amio 200 mg daily. On chronic  anticoag with eliquis 5 mg BID. No bleeding.   Continue current dose of ramipril , coreg, ivabradine, and spironolactone.   Ultrasound ordered for new breast lump. Per Dr. Cruzita Lederer will need to be pursued as outpatient.  He has discussed with her oncologist who is aware.   Length of Stay: 1   Shirley Friar PA-C 12/22/2015, 9:14 AM  Advanced Heart Failure Team Pager [phone] (M-F; 7a - 4p)  Please contact [facility] for night-coverage after hours (4p -7a ) and weekends on [email or web address]  Patient seen and examined with Oda Kilts, PA-C. We discussed all aspects of the encounter. I agree with the assessment and plan as stated above.   Volume status much improved. Continue IV lasix one more day. Can likely go home tomorrow on previous home regimen and f/u in HF Clinic. Supp K+. Renal function ok.   Bensimhon, Daniel,MD 4:48 PM

## 2015-12-22 NOTE — Progress Notes (Signed)
PROGRESS NOTE  [name] [id number] DOB: 1956-06-27 DOA: 12/20/2015 PCP: Nicoletta Dress, MD   LOS: 1 day   Brief Narrative: 60 yo F with 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 - weight 228 >> 215, net -7.8 L  Chest pain  - with cardiac cath in January 2017 showing minimal CAD.  - likely related to fluid overload  - Resolved, no further episodes of chest pain  Recent GI bleed had EGD colonoscopy and last week patient also had capsule endoscopy  - source of bleeding is not known. Hemoglobin is stable at this time while on Apixaban. - Closely follow CBC. Increased creatinine from her baseline, not AKI - patient's creatinine is mildly elevated from her baseline. - stable with diuresis, monitor  OSA  - on CPAP at bedtime.  Hyperlipidemia  - on statins.  Anemia from GI bleed  - Hemoglobin appears to be  stable.  Paroxysmal atrial fibrillation  - presently in sinus rhythm.. Chads 2 vasc score is 5. Patient is on Apixaban Coreg and amiodarone and Ivabradine.  Breast cancer in remission. - a new lump appeared on exam on her left breast - unable to do [place] / mammography as inpatient, will need close follow up as outpatient in breast center  Diabetes mellitus type 2  - patient's insulin dose was recently increased yesterday. - CBG is under control today, continue current regimen  Hypokalemia - Due to diuresis, replete and continue to monitor  Hypertension  - on Coreg and ramipril.   Hypothyroidism  - on Synthroid  - TSH normal at 4.0   DVT prophylaxis: Eliquis Code Status: DNR Family Communication: no family bedside Disposition Plan: home when ready, likely 1 day  Consultants:   Cardiology   Procedures:   None   Antimicrobials:  None    Subjective: - No complaints, feels a lot better, was able to ambulate in the room with less dyspnea. Appreciates improvement in her lower extremity swelling.  Objective: Filed Vitals:   12/21/15 2152 12/21/15 2315 12/22/15 0026 12/22/15 0526  BP: 128/60  109/47 108/49  Pulse: 91 88 86 82  Temp: 98 F (36.7 C)  97.7 F (36.5 C) 97.5 F (36.4 C)  TempSrc: Oral  Oral Oral  Resp: 20 18 18 18   Height:      Weight:    97.932 kg (215 lb 14.4 oz)  SpO2: 98% 98% 97% 97%    Intake/Output Summary (Last 24 hours) at 12/22/15 1443 Last data filed at 12/22/15 1053  Gross per 24 hour  Intake    775 ml  Output   4750 ml  Net  -3975 ml   Filed Weights   12/21/15 0049 12/21/15 0301 12/22/15 0526  Weight: 102.331 kg (225 lb 9.6 oz) 100.925 kg (222 lb 8 oz) 97.932 kg (215 lb 14.4 oz)    Examination: Constitutional: NAD Filed Vitals:   12/21/15 2152 12/21/15 2315 12/22/15 0026 12/22/15 0526  BP: 128/60  109/47 108/49  Pulse: 91 88 86 82  Temp: 98 F (36.7 C)  97.7 F (36.5 C) 97.5 F (36.4 C)  TempSrc: Oral  Oral Oral  Resp:  20 18 18 18   Height:      Weight:    97.932 kg (215 lb 14.4 oz)  SpO2: 98% 98% 97% 97%   Eyes: PERRL, lids and conjunctivae normal ENMT: Mucous membranes are moist. No oropharyngeal exudates Neck: normal, supple, no masses, no thyromegaly Respiratory: clear to auscultation bilaterally, no wheezing, no crackles. Normal respiratory effort. No accessory muscle use.  Cardiovascular: Regular rate and rhythm, no murmurs / rubs / gallops. trace LE edema. 2+ pedal pulses. Abdomen: no tenderness. Bowel sounds positive.  Musculoskeletal: no clubbing / cyanosis. No joint deformity upper and lower extremities. No contractures. Normal muscle tone.  Skin: no rashes, lesions, ulcers. No induration Neurologic: non focal    Data Reviewed: I have personally reviewed following labs and imaging studies  CBC:  Recent Labs Lab 12/19/15 1313 12/20/15 2134 12/21/15 0511  WBC 8.2 8.5 8.2  NEUTROABS 6.0  --  5.8  HGB 10.4* 10.2* 10.4*  HCT 31.5* 31.4* 33.6*  MCV 89.0 91.5 92.6  PLT 287.0 304 99991111   Basic Metabolic Panel:  Recent Labs Lab 12/20/15 2134 12/21/15 0511 12/22/15 0428  NA 136 139 138  K 3.0* 3.2* 3.1*  CL 99* 99* 94*  CO2 26 28 31   GLUCOSE 106* 171* 128*  BUN 13 11 14   CREATININE 1.18* 1.10* 1.11*  CALCIUM 9.2 9.1 9.7   GFR: Estimated Creatinine Clearance: 62.5 mL/min (by C-G formula based on Cr of 1.11). Liver Function Tests:  Recent Labs Lab 12/21/15 0511  AST 23  ALT 21  ALKPHOS 58  BILITOT 1.2  PROT 7.4  ALBUMIN 3.3*   No results for input(s): LIPASE, AMYLASE in the last 168 hours. No results for input(s): AMMONIA in the last 168 hours. Coagulation Profile: No results for input(s): INR, PROTIME in the last 168 hours. Cardiac Enzymes:  Recent Labs Lab 12/21/15 0027 12/21/15 0511 12/21/15 1055 12/21/15 1613  TROPONINI <0.03 0.03 <0.03 <0.03   BNP (last 3 results) No results for input(s): PROBNP in the last 8760 hours. HbA1C: No results for input(s):  HGBA1C in the last 72 hours. CBG:  Recent Labs Lab 12/21/15 0652 12/21/15 2107 12/22/15 0629 12/22/15 1220  GLUCAP 168* 96 171* 182*   Lipid Profile: No results for input(s): CHOL, HDL, LDLCALC, TRIG, CHOLHDL, LDLDIRECT in the last 72 hours. Thyroid Function Tests:  Recent Labs  12/21/15 0511  TSH 4.005   Anemia Panel: No results for input(s): VITAMINB12, FOLATE, FERRITIN, TIBC, IRON, RETICCTPCT in the last 72 hours. Urine analysis:    Component Value Date/Time   COLORURINE Straw 09/10/2014 2000   COLORURINE YELLOW 09/11/2009 0233   APPEARANCEUR Clear 09/10/2014 2000   APPEARANCEUR HAZY* 09/11/2009 0233   LABSPEC 1.011 09/10/2014 2000   LABSPEC 1.015 09/11/2009 0233   PHURINE 5.0 09/10/2014 2000   PHURINE 5.0 09/11/2009 0233   GLUCOSEU Negative 09/10/2014 2000   GLUCOSEU 500* 09/11/2009 0233  HGBUR Negative 09/10/2014 2000   HGBUR NEGATIVE 09/11/2009 0233   BILIRUBINUR Negative 09/10/2014 2000   BILIRUBINUR NEGATIVE 09/11/2009 0233   KETONESUR Negative 09/10/2014 [place] 09/11/2009 0233   PROTEINUR Negative 09/10/2014 2000   PROTEINUR NEGATIVE 09/11/2009 0233   UROBILINOGEN 0.2 09/11/2009 0233   NITRITE Negative 09/10/2014 2000   NITRITE NEGATIVE 09/11/2009 0233   LEUKOCYTESUR Negative 09/10/2014 2000   LEUKOCYTESUR  09/11/2009 0233    NEGATIVE MICROSCOPIC NOT DONE ON URINES WITH NEGATIVE PROTEIN, BLOOD, LEUKOCYTES, NITRITE, OR GLUCOSE <1000 mg/dL.   Sepsis Labs: Invalid input(s): PROCALCITONIN, LACTICIDVEN  No results found for this or any previous visit (from the past 240 hour(s)).    Radiology Studies: Dg Chest 2 View  12/20/2015  CLINICAL DATA:  Chest pain since this morning. Shortness of breath for 2.5 weeks. Bilateral ankle swelling for 3 weeks. History of right breast cancer. Former smoker. EXAM: CHEST  2 VIEW COMPARISON:  11/14/2015 FINDINGS: Cardiac pacemaker. Mild cardiac enlargement with mild pulmonary vascular congestion.  Interstitial changes in the lungs suggest mild edema. No focal airspace disease or consolidation. No blunting of costophrenic angles. No pneumothorax. Hyperinflation suggesting underlying emphysematous change. Postoperative right mastectomy. Calcification of the aorta. IMPRESSION: Cardiac enlargement with mild pulmonary vascular congestion interstitial edema. Electronically Signed   By: Lucienne Capers M.D.   On: 12/20/2015 22:18     Scheduled Meds: . amiodarone  200 mg Oral Daily  . apixaban  5 mg Oral BID  . atorvastatin  40 mg Oral q1800  . busPIRone  30 mg Oral BID  . carvedilol  6.25 mg Oral BID WC  . digoxin  0.125 mg Oral QODAY  . docusate sodium  300 mg Oral QHS  . DULoxetine  30 mg Oral Daily  . fluticasone  2 spray Each Nare Daily  . furosemide  60 mg Intravenous BID  . [START ON 12/23/2015] furosemide  40 mg Oral BID  . insulin aspart  0-9 Units Subcutaneous TID WC  . insulin aspart protamine- aspart  75 Units Subcutaneous BID WC  . ivabradine  5 mg Oral BID WC  . levothyroxine  75 mcg Oral QAC breakfast  . pantoprazole  40 mg Oral BID  . potassium chloride SA  40 mEq Oral BID  . ramipril  2.5 mg Oral Daily  . rOPINIRole  1 mg Oral QHS  . sodium chloride flush  3 mL Intravenous Q12H  . sodium chloride flush  3 mL Intravenous Q12H  . spironolactone  25 mg Oral Daily  . tiotropium  18 mcg Inhalation Daily  . vitamin B-12  1,000 mcg Oral Daily  . vitamin C  500 mg Oral Daily   Continuous Infusions:    Marzetta Board, MD, PhD [facility] Pager [phone]  If 7PM-7AM, please contact night-coverage [email or web address] Password [facility] 12/22/2015, 2:43 PM

## 2015-12-22 NOTE — Consult Note (Addendum)
   [facility] CM Inpatient Consult   12/22/2015  [name] 1956/03/21 [id number]  Patient evaluated for community based chronic disease management services with [facility] as a benefit of patient's [facility]. Spoke with patient at bedside to explain [facility] services. Patient admitted with HF Exacerbation.  HX of Diabetes, Atrial Fibrillation, and Breast CA. Patient has been admitted 4 times in the past 6 months.  Patient expresses a HX of Falls.   Patient endorses that her primary care provider is [name], NP for Dr. Nelda Bucks.  Patient states she  Patient will receive post hospital discharge call and will be evaluated for monthly home visits for assessments and disease process education. Consent form signed.   Left contact information and [facility] literature at bedside. Made Inpatient Case Manager aware that [facility] following. Of note, [facility] Care Management services does not replace or interfere with any services that are arranged by inpatient case management or social work.  For additional questions or referrals please contact:    Natividad Brood, [facility] Liaison  [phone] business mobile phone Toll free office [phone]  Patient states her best contact number is [phone] (cell).

## 2015-12-22 NOTE — Progress Notes (Signed)
Patient places themselves on CPAP will call if any assistance needed

## 2015-12-23 LAB — BASIC METABOLIC PANEL
ANION GAP: 11 (ref 5–15)
BUN: 22 mg/dL — AB (ref 6–20)
CO2: 31 mmol/L (ref 22–32)
Calcium: 9.7 mg/dL (ref 8.9–10.3)
Chloride: 94 mmol/L — ABNORMAL LOW (ref 101–111)
Creatinine, Ser: 1.15 mg/dL — ABNORMAL HIGH (ref 0.44–1.00)
GFR calc Af Amer: 59 mL/min — ABNORMAL LOW (ref >60)
GFR, EST NON AFRICAN AMERICAN: 51 mL/min — AB (ref >60)
Glucose, Bld: 144 mg/dL — ABNORMAL HIGH (ref 65–99)
POTASSIUM: 3.2 mmol/L — AB (ref 3.5–5.1)
SODIUM: 136 mmol/L (ref 135–145)

## 2015-12-23 LAB — GLUCOSE, CAPILLARY: GLUCOSE-CAPILLARY: 152 mg/dL — AB (ref 65–99)

## 2015-12-23 NOTE — Progress Notes (Signed)
Patient ID: [name], female   DOB: 05-11-1956, 60 y.o.   MRN: [id number]  Please see CHF service note from yesterday, no new cardiac recommendations at this time.       Carlyle Dolly, M.D.

## 2015-12-23 NOTE — Progress Notes (Signed)
Pt refused bed alarm on. 

## 2015-12-23 NOTE — Discharge Instructions (Signed)
Follow with heart failure clinic as scheduled  Please get a complete blood count and chemistry panel checked by your Primary MD at your next visit, and again as instructed by your Primary MD. Please get your medications reviewed and adjusted by your Primary MD.  Please request your Primary MD to go over all Hospital Tests and Procedure/Radiological results at the follow up, please get all Hospital records sent to your Prim MD by signing hospital release before you go home.  If you had Pneumonia of Lung problems at the Hospital: Please get a 2 view Chest X ray done in 6-8 weeks after hospital discharge or sooner if instructed by your Primary MD.  If you have Congestive Heart Failure: Please call your Cardiologist or Primary MD anytime you have any of the following symptoms:  1) 3 pound weight gain in 24 hours or 5 pounds in 1 week  2) shortness of breath, with or without a dry hacking cough  3) swelling in the hands, feet or stomach  4) if you have to sleep on extra pillows at night in order to breathe  Follow cardiac low salt diet and 1.5 lit/day fluid restriction.  If you have diabetes Accuchecks 4 times/day, Once in AM empty stomach and then before each meal. Log in all results and show them to your primary doctor at your next visit. If any glucose reading is under 80 or above 300 call your primary MD immediately.  If you have Seizure/Convulsions/Epilepsy: Please do not drive, operate heavy machinery, participate in activities at heights or participate in high speed sports until you have seen by Primary MD or a Neurologist and advised to do so again.  If you had Gastrointestinal Bleeding: Please ask your Primary MD to check a complete blood count within one week of discharge or at your next visit. Your endoscopic/colonoscopic biopsies that are pending at the time of discharge, will also need to followed by your Primary MD.  Get Medicines reviewed and adjusted. Please take all your  medications with you for your next visit with your Primary MD  Please request your Primary MD to go over all hospital tests and procedure/radiological results at the follow up, please ask your Primary MD to get all Hospital records sent to his/her office.  If you experience worsening of your admission symptoms, develop shortness of breath, life threatening emergency, suicidal or homicidal thoughts you must seek medical attention immediately by calling 911 or calling your MD immediately  if symptoms less severe.  You must read complete instructions/literature along with all the possible adverse reactions/side effects for all the Medicines you take and that have been prescribed to you. Take any new Medicines after you have completely understood and accpet all the possible adverse reactions/side effects.   Do not drive or operate heavy machinery when taking Pain medications.   Do not take more than prescribed Pain, Sleep and Anxiety Medications  Special Instructions: If you have smoked or chewed Tobacco  in the last 2 yrs please stop smoking, stop any regular Alcohol  and or any Recreational drug use.  Wear Seat belts while driving.  Please note You were cared for by a hospitalist during your hospital stay. If you have any questions about your discharge medications or the care you received while you were in the hospital after you are discharged, you can call the unit and asked to speak with the hospitalist on call if the hospitalist that took care of you is not available. Once you  are discharged, your primary care physician will handle any further medical issues. Please note that NO REFILLS for any discharge medications will be authorized once you are discharged, as it is imperative that you return to your primary care physician (or establish a relationship with a primary care physician if you do not have one) for your aftercare needs so that they can reassess your need for medications and monitor your  lab values.  You can reach the hospitalist office at phone [phone] or fax [phone]   If you do not have a primary care physician, you can call [phone] for a physician referral.  Activity: As tolerated with Full fall precautions use walker/cane & assistance as needed  Diet: heart healthy  Disposition Home

## 2015-12-23 NOTE — Discharge Summary (Signed)
Physician Discharge Summary  [name] [id number] DOB: 10-25-1955 DOA: 12/20/2015  PCP: Nicoletta Dress, MD  Admit date: 12/20/2015 Discharge date: 12/23/2015  Time spent: > 30 minutes  Recommendations for Outpatient Follow-up:  1. 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)   Chest pain at rest   PAF (paroxysmal atrial fibrillation) (HCC)   Bleeding gastrointestinal   CHF (congestive heart failure) ([place])  Discharge Condition: Stable  Diet recommendation: Heart healthy, low-salt  Filed Weights   12/21/15 0301 12/22/15 0526 12/23/15 0611  Weight: 100.925 kg (222 lb 8 oz) 97.932 kg (215 lb 14.4 oz) 96.979 kg (213 lb 12.8 oz)    History of present illness:  See H&P, Labs, Consult and Test reports for all details in brief, patient is a 60 yo F with 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 She was placed on IV diuresis with excellent results being total net -9.6 L and her weight has improved from 228 on admission to 213 on discharge. Her swelling has resolved, she was no longer dyspneic and able to ambulate in the hallway without further issues. She  was discharged home in stable condition and she is to follow-up with cardiology as an outpatient. She endorsed some dietary noncompliance and some misunderstanding in terms of her daily sodium intake, we discussed extensively about heart healthy diet and low sodium intake. She will continue daily weights. Chest pain - with cardiac cath in January 2017 showing minimal CAD. Likely related to fluid overload rather than ACS. Resolved with improvement in fluid status, no further episodes of chest pain Recent GI bleed had EGD colonoscopy and last week patient also had capsule endoscopy, source of bleeding is not known. Hemoglobin is stable at this time while on Apixaban. OSA - on CPAP at bedtime. Hyperlipidemia - on statins. Anemia from GI bleed - Hemoglobin appears to be stable. Paroxysmal atrial fibrillation - presently in sinus rhythm.. Chads 2 vasc score is 5. Patient is on Apixaban Coreg and amiodarone and Ivabradine. Breast cancer in remission - a new lump appeared on exam on her left breast, unable to do [place] / mammography as inpatient, will need close follow up as outpatient in breast center Diabetes mellitus type 2  Hypertension - on Coreg and ramipril.  Hypothyroidism - on Synthroid, TSH normal at 4.0  Procedures:  None    Consultations:  Cardiology   Discharge Exam: Filed Vitals:   12/22/15 0526 12/22/15 1924 12/22/15 2045 12/23/15 0611  BP: 108/49  115/46 98/43  Pulse: 82  87 78  Temp: 97.5 F (36.4 C)  97.8 F (36.6 C) 97.5 F (36.4 C)  TempSrc: Oral  Oral Oral  Resp: 18  18 18   Height:      Weight: 97.932 kg (215 lb 14.4  oz)   96.979 kg (213 lb 12.8 oz)  SpO2: 97% 97% 98% 98%    General: NAD Cardiovascular: RRR Respiratory: CTA biL  Discharge Instructions Activity:  As tolerated   Get Medicines reviewed and adjusted: Please take all your medications with you for your next visit with your Primary MD  Please request your Primary MD to go over all hospital tests  and procedure/radiological results at the follow up, please ask your Primary MD to get all Hospital records sent to his/her office.  If you experience worsening of your admission symptoms, develop shortness of breath, life threatening emergency, suicidal or homicidal thoughts you must seek medical attention immediately by calling 911 or calling your MD immediately if symptoms less severe.  You must read complete instructions/literature along with all the possible adverse reactions/side effects for all the Medicines you take and that have been prescribed to you. Take any new Medicines after you have completely understood and accpet all the possible adverse reactions/side effects.   Do not drive when taking Pain medications.   Do not take more than prescribed Pain, Sleep and Anxiety Medications  Special Instructions: If you have smoked or chewed Tobacco in the last 2 yrs please stop smoking, stop any regular Alcohol and or any Recreational drug use.  Wear Seat belts while driving.  Please note  You were cared for by a hospitalist during your hospital stay. Once you are discharged, your primary care physician will handle any further medical issues. Please note that NO REFILLS for any discharge medications will be authorized once you are discharged, as it is imperative that you return to your primary care physician (or establish a relationship with a primary care physician if you do not have one) for your aftercare needs so that they can reassess your need for medications and monitor your lab values. Discharge Instructions    AMB Referral to [facility] [facility]    Complete by:  As directed   Reason for consult:  HF exacerbation, 4th admission in 6 mons  Diagnoses of:   Heart Failure Diabetes    Expected date of contact:  1-3 days (reserved for hospital discharges)  Please assign to community nurse for transition of care calls and assess for home visits. Questions please call:   Natividad Brood, RN BSN [facility] Liaison  [phone] business mobile phone Toll free office [phone]            Medication List    TAKE these medications        ALPRAZolam 0.5 MG tablet  Commonly known as:  XANAX  Take 0.5 mg by mouth daily as needed for anxiety.     amiodarone 200 MG tablet  Commonly known as:  PACERONE  Take 1 tablet (200 mg total) by mouth daily.     apixaban 5 MG Tabs tablet  Commonly known as:  ELIQUIS  Take 1 tablet (5 mg total) by mouth 2 (two) times daily.     atorvastatin 40 MG tablet  Commonly known as:  LIPITOR  Take 40 mg by mouth daily.     busPIRone 30 MG tablet  Commonly known as:  BUSPAR  Take 30 mg by mouth 2 (two) times daily.     carvedilol 6.25 MG tablet  Commonly known as:  COREG  Take 1 tablet (6.25 mg total) by mouth 2 (two) times daily with a meal.     digoxin 0.125 MG tablet  Commonly known as:  LANOXIN  Take  1 tablet (0.125 mg total) by mouth every other day.     docusate sodium 100 MG capsule  Commonly known as:  COLACE  Take 3 capsules (300 mg total) by mouth at bedtime.     DULoxetine 30 MG capsule  Commonly known as:  CYMBALTA  Take 30 mg by mouth daily.     furosemide 20 MG tablet  Commonly known as:  LASIX  40 mg twice a day     Glucosamine 500 MG Caps  Take 1 capsule by mouth daily.     HUMALOG KWIKPEN 100 UNIT/ML KiwkPen  Generic drug:  insulin lispro  Inject 7-34 Units into the skin 3 (three) times daily. Sliding scale     HUMALOG MIX 75/25 KWIKPEN (75-25) 100 UNIT/ML Kwikpen  Generic drug:  Insulin Lispro Prot & Lispro  Inject 75 Units into the skin 2 (two) times daily.     hydrOXYzine 50 MG tablet  Commonly known as:  ATARAX/VISTARIL  Take 50 mg by mouth every 6 (six) hours as needed for itching.     Insulin Pen Needle 31G X 8 MM Misc  Commonly known as:  B-D ULTRAFINE III SHORT PEN  18/day as directed dx 250.00     ivabradine 5 MG Tabs tablet  Commonly known as:  CORLANOR    Take 1 tablet (5 mg total) by mouth 2 (two) times daily with a meal.     levothyroxine 75 MCG tablet  Commonly known as:  SYNTHROID, LEVOTHROID  Take 75 mcg by mouth daily before breakfast.     Melatonin 1 MG Tabs  Take 0.5 mg by mouth at bedtime.     metolazone 2.5 MG tablet  Commonly known as:  ZAROXOLYN  Take 2.5 mg by mouth as needed (for weight gain >3 lbs).     mometasone 50 MCG/ACT nasal spray  Commonly known as:  NASONEX  Place 1 spray into both nostrils daily. allergies     nitroGLYCERIN 0.4 MG SL tablet  Commonly known as:  NITROSTAT  Place 1 tablet (0.4 mg total) under the tongue every 5 (five) minutes as needed. For chest pain     pantoprazole 40 MG tablet  Commonly known as:  PROTONIX  Take 40 mg by mouth 2 (two) times daily.     potassium chloride SA 20 MEQ tablet  Commonly known as:  K-DUR,KLOR-CON  Take 2 tablets (40 mEq total) by mouth 2 (two) times daily.     PROVENTIL HFA 108 (90 Base) MCG/ACT inhaler  Generic drug:  albuterol  Inhale 2 puffs into the lungs every 6 (six) hours as needed. For shortness of breath     ramipril 2.5 MG capsule  Commonly known as:  ALTACE  Take 1 capsule (2.5 mg total) by mouth daily.     rOPINIRole 1 MG tablet  Commonly known as:  REQUIP  Take 1 mg by mouth at bedtime.     spironolactone 25 MG tablet  Commonly known as:  ALDACTONE  Take 25 mg by mouth daily.     SUMAtriptan 100 MG tablet  Commonly known as:  IMITREX  Take 1 tablet by mouth daily as needed.     tiotropium 18 MCG inhalation capsule  Commonly known as:  SPIRIVA  Place 18 mcg into inhaler and inhale daily.     TYLENOL ARTHRITIS PAIN 650 MG CR tablet  Generic drug:  acetaminophen  Take 1,300 mg by mouth every 8 (eight) hours as needed. pain     vitamin  B-12 1000 MCG tablet  Commonly known as:  CYANOCOBALAMIN  Take 1,000 mcg by mouth daily.     vitamin C 500 MG tablet  Commonly known as:  ASCORBIC ACID  Take 500 mg by mouth daily.            Follow-up Information    Follow up with [facility] On 01/02/2016.   Specialty:  Cardiology   Why:  at 0930 for post hospital follow up. Please bring all of your medication to your visit. The code for parking is 0002.   Contact information:   [address] [id number] [place] [phone]      The results of significant diagnostics from this hospitalization (including imaging, microbiology, ancillary and laboratory) are listed below for reference.    Significant Diagnostic Studies: Dg Chest 2 View  12/20/2015  CLINICAL DATA:  Chest pain since this morning. Shortness of breath for 2.5 weeks. Bilateral ankle swelling for 3 weeks. History of right breast cancer. Former smoker. EXAM: CHEST  2 VIEW COMPARISON:  11/14/2015 FINDINGS: Cardiac pacemaker. Mild cardiac enlargement with mild pulmonary vascular congestion. Interstitial changes in the lungs suggest mild edema. No focal airspace disease or consolidation. No blunting of costophrenic angles. No pneumothorax. Hyperinflation suggesting underlying emphysematous change. Postoperative right mastectomy. Calcification of the aorta. IMPRESSION: Cardiac enlargement with mild pulmonary vascular congestion interstitial edema. Electronically Signed   By: Lucienne Capers M.D.   On: 12/20/2015 22:18   Dg Abd 1 View  12/19/2015  CLINICAL DATA:  Evaluate for passage of a Givens capsule. EXAM: ABDOMEN - 1 VIEW COMPARISON:  None in PACs FINDINGS: The strain lateral aspects of both flanks are excluded from the view. The inferior aspect of the rectum is excluded. No Givens capsule is observed. The bowel gas pattern is normal. There surgical clips in the gallbladder fossa. IMPRESSION: No Givens capsule is demonstrated in the field of view of the abdominal study today. Electronically Signed   By: David  Martinique M.D.   On: 12/19/2015 13:53   Labs: Basic Metabolic Panel:  Recent Labs Lab  12/20/15 2134 12/21/15 0511 12/22/15 0428 12/23/15 0329  NA 136 139 138 136  K 3.0* 3.2* 3.1* 3.2*  CL 99* 99* 94* 94*  CO2 26 28 31 31   GLUCOSE 106* 171* 128* 144*  BUN 13 11 14  22*  CREATININE 1.18* 1.10* 1.11* 1.15*  CALCIUM 9.2 9.1 9.7 9.7   Liver Function Tests:  Recent Labs Lab 12/21/15 0511  AST 23  ALT 21  ALKPHOS 58  BILITOT 1.2  PROT 7.4  ALBUMIN 3.3*   CBC:  Recent Labs Lab 12/19/15 1313 12/20/15 2134 12/21/15 0511  WBC 8.2 8.5 8.2  NEUTROABS 6.0  --  5.8  HGB 10.4* 10.2* 10.4*  HCT 31.5* 31.4* 33.6*  MCV 89.0 91.5 92.6  PLT 287.0 304 319   Cardiac Enzymes:  Recent Labs Lab 12/21/15 0027 12/21/15 0511 12/21/15 1055 12/21/15 1613  TROPONINI <0.03 0.03 <0.03 <0.03   BNP: BNP (last 3 results)  Recent Labs  08/19/15 2035 11/14/15 1240 12/21/15 0027  BNP 142.7* 216.4* 121.7*   CBG:  Recent Labs Lab 12/22/15 0629 12/22/15 1220 12/22/15 1722 12/22/15 2221 12/23/15 0622  GLUCAP 171* 182* 221* 127* 152*    Signed:  Mccade Sullenberger  [facility] 12/23/2015, 11:57 AM

## 2015-12-25 ENCOUNTER — Telehealth: Payer: Self-pay | Admitting: Cardiology

## 2015-12-25 ENCOUNTER — Other Ambulatory Visit: Payer: Self-pay | Admitting: *Deleted

## 2015-12-25 ENCOUNTER — Encounter: Payer: Medicare Other | Admitting: *Deleted

## 2015-12-25 NOTE — Patient Outreach (Signed)
[facility]) Care Management  12/25/2015  [name] [birth year] [id number]   Initial transition of care call placed, no answer left a HIPAA compliant message with my return call back number.,  Plan Will await return call , if no response will attempt contact on 5/23.  Joylene Draft, RN, [place] [facility] [phone]- Mobile [phone]- Toll Free Main Office

## 2015-12-25 NOTE — Telephone Encounter (Signed)
LMOVM reminding pt to send remote transmission.   

## 2015-12-26 ENCOUNTER — Encounter: Payer: Self-pay | Admitting: *Deleted

## 2015-12-26 ENCOUNTER — Other Ambulatory Visit: Payer: Self-pay | Admitting: *Deleted

## 2015-12-26 NOTE — Patient Outreach (Signed)
[place] [facility]) Care Management  12/26/2015  [facility]) Care Management  [place]  12/26/2015   [name] [birth year] [id number]  Initial Transition of care call Patient discharged 5/20   Subjective:  [name] reports that she is doing great.  Patient reports that she is very familiar with heart failure, she weighs herself daily and her weight is coming down. She denies any increase in swelling, shortness of breath or cough. [name] reports that she is able to complete her usual activities at home, tolerating her diet, she monitors her sodium and carbohydrates. [name] checks her blood sugar 4 times a day, takes medication as prescribed, she verbalzes understanding her discharge instructions.    Patient unable to review medication at this telephone visit.  Encounter Medications:  Outpatient Encounter Prescriptions as of 12/26/2015  Medication Sig  . acetaminophen (TYLENOL ARTHRITIS PAIN) 650 MG CR tablet Take 1,300 mg by mouth every 8 (eight) hours as needed. pain  . albuterol (PROVENTIL HFA) 108 (90 BASE) MCG/ACT inhaler Inhale 2 puffs into the lungs every 6 (six) hours as needed. For shortness of breath  . ALPRAZolam (XANAX) 0.5 MG tablet Take 0.5 mg by mouth daily as needed for anxiety.   Marland Kitchen amiodarone (PACERONE) 200 MG tablet Take 1 tablet (200 mg total) by mouth daily.  Marland Kitchen apixaban (ELIQUIS) 5 MG TABS tablet Take 1 tablet (5 mg total) by mouth 2 (two) times daily.  Marland Kitchen atorvastatin (LIPITOR) 40 MG tablet Take 40 mg by mouth daily.    . busPIRone (BUSPAR) 30 MG tablet Take 30 mg by mouth 2 (two) times daily.   . carvedilol (COREG) 6.25 MG tablet Take 1 tablet (6.25 mg total) by mouth 2 (two) times daily with a meal.  . digoxin (LANOXIN) 0.125 MG tablet Take 1 tablet (0.125 mg total) by mouth every other day.  . docusate sodium (COLACE) 100 MG capsule Take 3 capsules (300 mg total) by mouth at bedtime.  . DULoxetine  (CYMBALTA) 30 MG capsule Take 30 mg by mouth daily.  . furosemide (LASIX) 20 MG tablet 40 mg twice a day (Patient taking differently: Take 40 mg by mouth 2 (two) times daily. )  . Glucosamine 500 MG CAPS Take 1 capsule by mouth daily.    Marland Kitchen HUMALOG KWIKPEN 100 UNIT/ML KiwkPen Inject 7-34 Units into the skin 3 (three) times daily. Sliding scale  . HUMALOG MIX 75/25 KWIKPEN (75-25) 100 UNIT/ML Kwikpen Inject 75 Units into the skin 2 (two) times daily.  . hydrOXYzine (ATARAX/VISTARIL) 50 MG tablet Take 50 mg by mouth every 6 (six) hours as needed for itching.  . Insulin Pen Needle (B-D ULTRAFINE III SHORT PEN) 31G X 8 MM MISC 18/day as directed dx 250.00  . ivabradine (CORLANOR) 5 MG TABS tablet Take 1 tablet (5 mg total) by mouth 2 (two) times daily with a meal.  . levothyroxine (SYNTHROID, LEVOTHROID) 75 MCG tablet Take 75 mcg by mouth daily before breakfast.   . Melatonin 1 MG TABS Take 0.5 mg by mouth at bedtime.  . metolazone (ZAROXOLYN) 2.5 MG tablet Take 2.5 mg by mouth as needed (for weight gain >3 lbs).  . mometasone (NASONEX) 50 MCG/ACT nasal spray Place 1 spray into both nostrils daily. allergies  . nitroGLYCERIN (NITROSTAT) 0.4 MG SL tablet Place 1 tablet (0.4 mg total) under the tongue every 5 (five) minutes as needed. For chest pain  . pantoprazole (PROTONIX) 40 MG tablet Take 40 mg by mouth 2 (two) times  daily.    . potassium chloride SA (K-DUR,KLOR-CON) 20 MEQ tablet Take 2 tablets (40 mEq total) by mouth 2 (two) times daily.  . ramipril (ALTACE) 2.5 MG capsule Take 1 capsule (2.5 mg total) by mouth daily.  Marland Kitchen rOPINIRole (REQUIP) 1 MG tablet Take 1 mg by mouth at bedtime.    Marland Kitchen spironolactone (ALDACTONE) 25 MG tablet Take 25 mg by mouth daily.  . SUMAtriptan (IMITREX) 100 MG tablet Take 1 tablet by mouth daily as needed.  . tiotropium (SPIRIVA) 18 MCG inhalation capsule Place 18 mcg into inhaler and inhale daily.    . vitamin B-12 (CYANOCOBALAMIN) 1000 MCG tablet Take 1,000 mcg by mouth  daily.  . vitamin C (ASCORBIC ACID) 500 MG tablet Take 500 mg by mouth daily.   No facility-administered encounter medications on file as of 12/26/2015.   Assessment Patient progressing well since dc home , continues self managing her heart failure and diabetes.  Patient has follow up appointment with heart failure clinic on next.  Provided my contact information to patient, she denies any further questions or concerns at this time.  Plan:   Patient will remain active with transition of care program with weekly outreaches, patient is agreeable to a home visit on next week.    [facility] CM Care Plan Problem One        Most Recent Value   Care Plan Problem One  Recent hospital admission   Role Documenting the Problem One  Care Management [place] for Problem One  Active   [facility] Long Term Goal (31-90 days)  Patient will not experience a hospital readmission in the next 31 days   [facility] Long Term Goal Start Date  12/26/15   Interventions for Problem One Long Term Goal  Educated patient on the transition of program, provided my contact infomation, encouraged to notify MD of symptoms,follow discharge instructions, attend PCP appointment,    [facility] CM Short Term Goal #1 (0-30 days)  Patient will attend post hospital MD visit in the next 14 days   [facility] CM Short Term Goal #1 Start Date  12/26/15   Interventions for Short Term Goal #1  Verified patient has appointment scheduled and reinforced the importance of attending visit.    [facility] CM Short Term Goal #2 (0-30 days)  Patient will be able to state symptoms of heart failure and action plan to take in the next 30 days   [facility] CM Short Term Goal #2 Start Date  12/26/15   Interventions for Short Term Goal #2  RN will review symptoms of heart failure and action to take for symptoms.   [facility] CM Short Term Goal #3 (0-30 days)  Patient will continue to weigh and record daily in the next 30 days   [facility] CM Short Term Goal #3 Start Date  12/26/15    Interventions for Short Tern Goal #3  Encouraged patient to continue to weigh daily and record and take record to MD appointment     Joylene Draft, RN, [place] [facility] [phone]- Mobile [phone]- [place]

## 2015-12-29 ENCOUNTER — Encounter: Payer: Self-pay | Admitting: Cardiology

## 2016-01-01 NOTE — Progress Notes (Addendum)
Patient ID: [name], female   DOB: 08/27/1955, 60 y.o.   MRN: [id number]    Advanced Heart Failure Clinic Note   Endocrinologist: Dr Loanne Drilling  Oncologist: Dr. Humphrey Rolls PCP: [name], NP ([place]) Primary HF MD: Dr Haroldine Laws  HPI [name] is a 60  year old female with history of breast cancer, obesity, HTN, DM, IBS, HLD, OSA, chemo induced cardiomyopathy s/p ICD and chronic systolic HF.    She had chemo induced cardiomyopathy.  Her EF has fallen from 45% per Cardiac MRI in July 2010 to 20-25% in 10/2012. S/P single chamber ICD (St Jude) implantation on 11/10/12 by Dr. Rayann Heman.    Admitted 08/19/15 with chest pain. Had [place] [facility] with mild CAD and increased filling pressures. CPX placed on hold due to breast mass. She was told to have breast biopsy ASAP with her history of breast cancer x 2. Discharged on lasix 40 mg po twice daily, carvedilol 6.25 mg twice a day, ramipril 5 mg at bed time, dig 0.125 mg daily and ivabradine 5 mg twice a day. Discharge weight was 220 pounds.    January 2017 Had breast biopsy with negative results.   Admitted 12/21/15 with increased dyspnea and chest pain. Additionally, a new breast lump was discovered, with outpatient follow up arranged. She diuresed 15 lbs. Discharge weight 213 lbs.  She returns today for post hospital follow up. Up 4 lbs from discharge. Has been eating better. She states prior to previous admission she had been eating poorly and drinking more fluid than she should. Did take a metolazone on 5/26 for weight 210 -> 217.  Has not been taking extra potassium. Back down to 209 the next day, and was a little dizzy.  Is keeping detailed notes of food intake, and limiting sodium to 1200 mg a day (occasionally over). Watching her fluid, getting in around 32-40 oz daily.  Denies SOB/PND/Orthopnea.  Breathing is "100% better", No longer DOE on flat ground. Uses CPAP at night Taking all medications.  She followed closely by her PCP. Needs  to schedule a mammogram for new breast lump.    CPX 2017  Peak VO2: 12.7 (73% predicted peak VO2) VE/VCO2 slope: 32.3 OUES: 1.62 Peak RER: 1.08 Exercise testing with gas exchange demonstrates a mild to moderate functional impairment when compared to matched sedentary norms. At peak exercise, patient appears to have mild circulatory limitation, Her body habitus and related restrictive lung physiology are also likely contributing to her exercise intolerance. Chronotropic incompetence was also present.   RHC/LCH  08/20/2014   Mid Cx lesion, 30% stenosed.  3rd Mrg lesion, 30% stenosed. Findings: RA = 12 RV = 55/6/12 PA = 54/21 (34) PCW = 22 Ao 93/58 (71) LV 106/8/26 Fick cardiac output/index = 4.2/2.1 PVR = 2.8 SVR = 1115 FA sat = 93% PA sat = 54%, 53% Assessment: 1. Very mild CAD 2. Mild to moderately increased filling pressures 3. Severe LV dysfunction due to NICM EF 20%medications.   [place] on 01/30/12 showed decompensated HF with low cardiac output consistent with cardiogenic shock.  [place] 6/27  RA = 20  RV = 55/13/22  PA =56/34 (44)  PCW = 28  Fick cardiac output/index = 3.1/1.5  PVR = 5.3 [place]  FA sat = 91%  PA sat = 42%, 48%  SVC sat = 46%  Echo 08/12/11: Left ventricle: mildly dilated, EF 20% to 25%. Diffuse hypokinesis. Doppler parameters are consistent with restrictive physiology, indicative of decreased left ventricular diastolic compliance  and/or increased left atrial pressure. Left atrium was moderately dilated. Right atrium mildly dilated. 04/09/12 ECHO EF 30-35% LV mildly dilated  Hyopkinesis 10/07/12 ECHO EF 20-25% RV ok. 03/31/14: ECHO EF 30-35%, severe HK inferior/inferoseptal walls, mild MR, LA mod dilated, RA mildly dilated 06/28/2015: ECHO EF 25% RV mildly dilated. Severe HK inferior wall.   Myoview in 05/2010 EF 59%. No ischemia.    ROS: All systems negative except as listed in HPI, PMH and Problem List.  Social History   Social History  .  Marital Status: Divorced    Spouse Name: N/A  . Number of Children: N/A  . Years of Education: N/A   Occupational History  . Disabled    Social History Main Topics  . Smoking status: Former Smoker    Quit date: 04/12/2001  . Smokeless tobacco: Never Used  . Alcohol Use: No  . Drug Use: No  . Sexual Activity: Not Currently   Other Topics Concern  . Not on file   Social History Narrative   Lives with mother and brother Philis Nettle         Family History  Problem Relation Age of Onset  . Diabetes Mellitus II Mother   . Coronary artery disease Father     CABG x 3  . Breast cancer Cousin   . Breast cancer Cousin   . Uterine cancer Cousin   . Lung cancer Mother   . Congestive Heart Failure Mother   . Hypertension Father   . Diabetes Mellitus II Father     Past Medical History  Diagnosis Date  . 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 DYSLIPIDEMIA 05/01/2009  . OBESITY 07/24/2007  . OBSTRUCTIVE SLEEP APNEA 07/24/2007    with CPAP compliance  . RESTLESS LEG SYNDROME 07/24/2007  . SYSTOLIC HEART FAILURE, CHRONIC 01/05/2009    a. chemo induced cardiomyopathy b. cMRI (02/2009) EF 45% c. Myoview 05/2010: EF 59%, no ischemia d. RHC (01/2012) RA 20, RV 55/13/22, PA 56/34 (44), PCWP 28, Fick CO/CI: 3.1 / 1.5, PVR 5.3 WU, PA sat 42% & 48% e. ECHO (10/2012) EF 20-25%, diff HK, MV calcified annulus f. ECHO (03/2014) EF 30-35%, sev HK, inferior/inferoseptal walls, mild MR  . 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 Hypertension   . History of breast cancer 2006    with return in 2008, s/p mastectomy, XRT and chemotherapy  . Osteoarthritis   . Anxiety   . Depression     followed by a psychiatrist  . Asthma   . GERD (gastroesophageal reflux disease)   . Gastroparesis   . IBS (irritable bowel syndrome)   . Myocardial infarction ([place])   . ICD (implantable cardiac defibrillator) in place 11/10/2012    ICD (11/2012)  . COPD (chronic obstructive pulmonary disease) ([place])   . Persistent atrial fibrillation ([place])  09/2014  . AICD (automatic cardioverter/defibrillator) present   . Presence of permanent cardiac pacemaker   . CHF (congestive heart failure) ([place])     Current Outpatient Prescriptions  Medication Sig Dispense Refill  . acetaminophen (TYLENOL ARTHRITIS PAIN) 650 MG CR tablet Take 1,300 mg by mouth every 8 (eight) hours as needed. pain    . albuterol (PROVENTIL HFA) 108 (90 BASE) MCG/ACT inhaler Inhale 2 puffs into the lungs every 6 (six) hours as needed. For shortness of breath    . ALPRAZolam (XANAX) 0.5 MG tablet Take 0.5 mg by mouth daily as needed for anxiety.     Marland Kitchen amiodarone (PACERONE) 200 MG tablet Take 1 tablet (200 mg total) by mouth daily.  30 tablet 6  . atorvastatin (LIPITOR) 40 MG tablet Take 40 mg by mouth daily.      . busPIRone (BUSPAR) 30 MG tablet Take 30 mg by mouth 2 (two) times daily.     . carvedilol (COREG) 6.25 MG tablet Take 1 tablet (6.25 mg total) by mouth 2 (two) times daily with a meal. 60 tablet 0  . digoxin (LANOXIN) 0.125 MG tablet Take 1 tablet (0.125 mg total) by mouth every other day. 90 tablet 3  . docusate sodium (COLACE) 100 MG capsule Take 3 capsules (300 mg total) by mouth at bedtime. 10 capsule 0  . DULoxetine (CYMBALTA) 30 MG capsule Take 30 mg by mouth daily.    . furosemide (LASIX) 20 MG tablet 40 mg twice a day (Patient taking differently: Take 40 mg by mouth 2 (two) times daily. ) 60 tablet 3  . Glucosamine 500 MG CAPS Take 1 capsule by mouth daily.      Marland Kitchen HUMALOG KWIKPEN 100 UNIT/ML KiwkPen Inject 7-34 Units into the skin 3 (three) times daily. Sliding scale    . HUMALOG MIX 75/25 KWIKPEN (75-25) 100 UNIT/ML Kwikpen Inject 75 Units into the skin 2 (two) times daily.    . hydrOXYzine (ATARAX/VISTARIL) 50 MG tablet Take 50 mg by mouth every 6 (six) hours as needed for itching.    . Insulin Pen Needle (B-D ULTRAFINE III SHORT PEN) 31G X 8 MM MISC 18/day as directed dx 250.00 500 each 11  . ivabradine (CORLANOR) 5 MG TABS tablet Take 1 tablet (5 mg  total) by mouth 2 (two) times daily with a meal. 60 tablet 3  . levothyroxine (SYNTHROID, LEVOTHROID) 75 MCG tablet Take 75 mcg by mouth daily before breakfast.     . Melatonin 1 MG TABS Take 0.5 mg by mouth at bedtime.    . metolazone (ZAROXOLYN) 2.5 MG tablet Take 2.5 mg by mouth as needed (for weight gain >3 lbs).    . mometasone (NASONEX) 50 MCG/ACT nasal spray Place 1 spray into both nostrils daily. allergies    . nitroGLYCERIN (NITROSTAT) 0.4 MG SL tablet Place 1 tablet (0.4 mg total) under the tongue every 5 (five) minutes as needed. For chest pain 25 tablet 3  . pantoprazole (PROTONIX) 40 MG tablet Take 40 mg by mouth 2 (two) times daily.      . potassium chloride SA (K-DUR,KLOR-CON) 20 MEQ tablet Take 2 tablets (40 mEq total) by mouth 2 (two) times daily. 120 tablet 6  . ramipril (ALTACE) 2.5 MG capsule Take 1 capsule (2.5 mg total) by mouth daily. 30 capsule 0  . rOPINIRole (REQUIP) 1 MG tablet Take 1 mg by mouth at bedtime.      Marland Kitchen spironolactone (ALDACTONE) 25 MG tablet Take 25 mg by mouth daily.    . SUMAtriptan (IMITREX) 100 MG tablet Take 1 tablet by mouth daily as needed.    . tiotropium (SPIRIVA) 18 MCG inhalation capsule Place 18 mcg into inhaler and inhale daily.      . vitamin B-12 (CYANOCOBALAMIN) 1000 MCG tablet Take 1,000 mcg by mouth daily.    . vitamin C (ASCORBIC ACID) 500 MG tablet Take 500 mg by mouth daily.     No current facility-administered medications for this encounter.   Filed Vitals:   01/02/16 0911  BP: 130/58  Pulse: 92  Weight: 217 lb 9.6 oz (98.703 kg)  SpO2: 95%   Wt Readings from Last 3 Encounters:  01/02/16 217 lb 9.6 oz (98.703 kg)  12/23/15  213 lb 12.8 oz (96.979 kg)  12/11/15 212 lb 6.4 oz (96.344 kg)     PHYSICAL EXAM: General:  Obese, Well appearing. Ambulated in the clinic without difficulty   HEENT: normal  Neck: supple. JVP difficult to assess, appears mildly elevated. ~8 cm, carotids 2+ bilaterally; no bruits. No thyromegaly or  nodule noted.  Cor: PMI normal. Irregularly irregular. no S3. Lungs: CTAB, normal effort Abdomen: soft, obese, NT, ND, no HSM. No bruits or masses. +BS  Extremities: no cyanosis, clubbing, rash, Trace edema 1/2 way to calf.  Neuro: alert & orientedx3, cranial nerves grossly intact. Moves all 4 extremities w/o difficulty. Affect pleasant.  EKG:  Sinus rhythm 85 bpm with 1st degree AV block, LAD, RBBB  ASSESSMENT & PLAN:  1) Chronic systolic HF, NICM thought to be chemo induced, s/p ST Jude  ICD. Most Recent ECHO 06/28/2015  EF ~20%. RV mildly dilated.  - Has NYHA II-III symptoms.  Will continue lasix 40 mg BID with metolazone as needed. (plans on taking metolazone this evening) Continue spiro 25 mg daily.   Be sure to take extra 40 meq of potassium on metolazone days.  - Continue coreg 6.25 mg BID  - Continue ramipril 5 mg qhs.   -Continue digoxin 0.125 mg daily . Dig level 0.2 on 08/2015  - Continue corlanor 5 mg twice a day.   - Check BMET and Mag today.   - CPX result with mild -mod circulatory component. Plan to repeat 6 month- 1 year from 09/2014.  - Reinforced the need and importance of daily weights, a low sodium diet, and fluid restriction (less than 2 L a day). Instructed to call the HF clinic if weight increases more than 3 lbs overnight or 5 lbs in a week.  2) HTN - Stable. Continue current medications as above.  3) OSA - Compliant with CPAP nightly. 4) HLD - Followed by PCP.  5) Obesity - taking detailed notes of her diet. Encouraged to continue and try and lose weight.  6) CAD - mild nonobstructive CAD on 2017 cath. Will continue statin, BB and ACE-I. No s/s of ischemia. 7) NSVT- Continue  amio to 200 mg daily. Needs yearly eye exam.  TSH January 2017 ok.  8) Breast Mass - Negative breast biopsy January 2017.    - Now has new breast mass. Awaiting work up.  9) Paroxysmal Afib - in NSR by exam and EKG today.  - Continue Eliquis 5 mg BID. Will give samples, she should call  next week if PCP has not taken care of prior auth.   Follow up in 4 weeks. Labs today.   Satira Mccallum Jerol Rufener PA-C 9:24 AM

## 2016-01-02 ENCOUNTER — Ambulatory Visit (HOSPITAL_COMMUNITY)
Admit: 2016-01-02 | Discharge: 2016-01-02 | Disposition: A | Discharge status: Home / Self Care | Payer: Medicare Other | Source: Ambulatory Visit | Attending: Cardiology | Admitting: Cardiology

## 2016-01-02 ENCOUNTER — Other Ambulatory Visit: Payer: Self-pay | Admitting: *Deleted

## 2016-01-02 VITALS — BP 130/58 | HR 92 | Wt 217.6 lb

## 2016-01-02 DIAGNOSIS — Z7901 Long term (current) use of anticoagulants: Secondary | ICD-10-CM | POA: Insufficient documentation

## 2016-01-02 DIAGNOSIS — Z9221 Personal history of antineoplastic chemotherapy: Secondary | ICD-10-CM | POA: Insufficient documentation

## 2016-01-02 DIAGNOSIS — I252 Old myocardial infarction: Secondary | ICD-10-CM | POA: Diagnosis not present

## 2016-01-02 DIAGNOSIS — E669 Obesity, unspecified: Secondary | ICD-10-CM | POA: Diagnosis not present

## 2016-01-02 DIAGNOSIS — G2581 Restless legs syndrome: Secondary | ICD-10-CM | POA: Insufficient documentation

## 2016-01-02 DIAGNOSIS — I472 Ventricular tachycardia: Secondary | ICD-10-CM | POA: Insufficient documentation

## 2016-01-02 DIAGNOSIS — F419 Anxiety disorder, unspecified: Secondary | ICD-10-CM | POA: Diagnosis not present

## 2016-01-02 DIAGNOSIS — I428 Other cardiomyopathies: Secondary | ICD-10-CM | POA: Diagnosis not present

## 2016-01-02 DIAGNOSIS — Z8249 Family history of ischemic heart disease and other diseases of the circulatory system: Secondary | ICD-10-CM | POA: Insufficient documentation

## 2016-01-02 DIAGNOSIS — Z79899 Other long term (current) drug therapy: Secondary | ICD-10-CM | POA: Insufficient documentation

## 2016-01-02 DIAGNOSIS — I5022 Chronic systolic (congestive) heart failure: Secondary | ICD-10-CM | POA: Diagnosis not present

## 2016-01-02 DIAGNOSIS — N63 Unspecified lump in breast: Secondary | ICD-10-CM | POA: Insufficient documentation

## 2016-01-02 DIAGNOSIS — K219 Gastro-esophageal reflux disease without esophagitis: Secondary | ICD-10-CM | POA: Insufficient documentation

## 2016-01-02 DIAGNOSIS — Z87891 Personal history of nicotine dependence: Secondary | ICD-10-CM | POA: Diagnosis not present

## 2016-01-02 DIAGNOSIS — F329 Major depressive disorder, single episode, unspecified: Secondary | ICD-10-CM | POA: Diagnosis not present

## 2016-01-02 DIAGNOSIS — E119 Type 2 diabetes mellitus without complications: Secondary | ICD-10-CM | POA: Insufficient documentation

## 2016-01-02 DIAGNOSIS — I2510 Atherosclerotic heart disease of native coronary artery without angina pectoris: Secondary | ICD-10-CM | POA: Insufficient documentation

## 2016-01-02 DIAGNOSIS — I110 Hypertensive heart disease with heart failure: Secondary | ICD-10-CM | POA: Insufficient documentation

## 2016-01-02 DIAGNOSIS — Z794 Long term (current) use of insulin: Secondary | ICD-10-CM | POA: Diagnosis not present

## 2016-01-02 DIAGNOSIS — I480 Paroxysmal atrial fibrillation: Secondary | ICD-10-CM | POA: Diagnosis not present

## 2016-01-02 DIAGNOSIS — Z923 Personal history of irradiation: Secondary | ICD-10-CM | POA: Diagnosis not present

## 2016-01-02 DIAGNOSIS — E785 Hyperlipidemia, unspecified: Secondary | ICD-10-CM | POA: Insufficient documentation

## 2016-01-02 DIAGNOSIS — G4733 Obstructive sleep apnea (adult) (pediatric): Secondary | ICD-10-CM | POA: Insufficient documentation

## 2016-01-02 DIAGNOSIS — Z95810 Presence of automatic (implantable) cardiac defibrillator: Secondary | ICD-10-CM | POA: Diagnosis not present

## 2016-01-02 DIAGNOSIS — Z833 Family history of diabetes mellitus: Secondary | ICD-10-CM | POA: Insufficient documentation

## 2016-01-02 DIAGNOSIS — I5023 Acute on chronic systolic (congestive) heart failure: Secondary | ICD-10-CM

## 2016-01-02 DIAGNOSIS — Z853 Personal history of malignant neoplasm of breast: Secondary | ICD-10-CM | POA: Diagnosis not present

## 2016-01-02 DIAGNOSIS — I10 Essential (primary) hypertension: Secondary | ICD-10-CM | POA: Diagnosis not present

## 2016-01-02 DIAGNOSIS — J449 Chronic obstructive pulmonary disease, unspecified: Secondary | ICD-10-CM | POA: Diagnosis not present

## 2016-01-02 LAB — BASIC METABOLIC PANEL
Anion gap: 10 (ref 5–15)
BUN: 23 mg/dL — ABNORMAL HIGH (ref 6–20)
CALCIUM: 9.7 mg/dL (ref 8.9–10.3)
CO2: 31 mmol/L (ref 22–32)
CREATININE: 1.18 mg/dL — AB (ref 0.44–1.00)
Chloride: 90 mmol/L — ABNORMAL LOW (ref 101–111)
GFR, EST AFRICAN AMERICAN: 57 mL/min — AB (ref >60)
GFR, EST NON AFRICAN AMERICAN: 49 mL/min — AB (ref >60)
Glucose, Bld: 397 mg/dL — ABNORMAL HIGH (ref 65–99)
Potassium: 2.9 mmol/L — ABNORMAL LOW (ref 3.5–5.1)
Sodium: 131 mmol/L — ABNORMAL LOW (ref 135–145)

## 2016-01-02 LAB — MAGNESIUM: MAGNESIUM: 2.1 mg/dL (ref 1.7–2.4)

## 2016-01-02 MED ORDER — POTASSIUM CHLORIDE CRYS ER 20 MEQ PO TBCR: 40.0000 meq | EXTENDED_RELEASE_TABLET | Freq: Two times a day (BID) | ORAL | Status: DC

## 2016-01-02 NOTE — Patient Instructions (Signed)
Labs today  If you have not heard anything about the PA for eliquis by next week, give [place] a call to follow up and assist. We have given you samples today so you are able to restart  Your physician recommends that you schedule a follow-up appointment in: 4 weeks  Do the following things EVERYDAY: 1) Weigh yourself in the morning before breakfast. Write it down and keep it in a log. 2) Take your medicines as prescribed 3) Eat low salt foods-Limit salt (sodium) to 2000 mg per day.  4) Stay as active as you can everyday 5) Limit all fluids for the day to less than 2 liters 6)

## 2016-01-02 NOTE — Patient Outreach (Signed)
[facility]) Care Management  [facility] Care Manager  01/02/2016   [name] 10-Aug-1955 [id number]  Transition of care call  Subjective:  [name] report that she is doing well on today, discussed her visit to heart failure clinic on today. Patient denies increased swelling or weight gain.    Patient was recently discharged from hospital and all medications have been reviewed. Encounter Medications:  Outpatient Encounter Prescriptions as of 01/02/2016  Medication Sig  . acetaminophen (TYLENOL ARTHRITIS PAIN) 650 MG CR tablet Take 1,300 mg by mouth every 8 (eight) hours as needed. pain  . albuterol (PROVENTIL HFA) 108 (90 BASE) MCG/ACT inhaler Inhale 2 puffs into the lungs every 6 (six) hours as needed. For shortness of breath  . ALPRAZolam (XANAX) 0.5 MG tablet Take 0.5 mg by mouth daily as needed for anxiety.   Marland Kitchen amiodarone (PACERONE) 200 MG tablet Take 1 tablet (200 mg total) by mouth daily.  Marland Kitchen atorvastatin (LIPITOR) 40 MG tablet Take 40 mg by mouth daily.    . busPIRone (BUSPAR) 30 MG tablet Take 30 mg by mouth 2 (two) times daily.   . carvedilol (COREG) 6.25 MG tablet Take 1 tablet (6.25 mg total) by mouth 2 (two) times daily with a meal.  . digoxin (LANOXIN) 0.125 MG tablet Take 1 tablet (0.125 mg total) by mouth every other day.  . docusate sodium (COLACE) 100 MG capsule Take 3 capsules (300 mg total) by mouth at bedtime.  . DULoxetine (CYMBALTA) 30 MG capsule Take 30 mg by mouth daily.  . furosemide (LASIX) 20 MG tablet 40 mg twice a day (Patient taking differently: Take 40 mg by mouth 2 (two) times daily. )  . Glucosamine 500 MG CAPS Take 1 capsule by mouth daily.    Marland Kitchen HUMALOG KWIKPEN 100 UNIT/ML KiwkPen Inject 7-34 Units into the skin 3 (three) times daily. Sliding scale  . HUMALOG MIX 75/25 KWIKPEN (75-25) 100 UNIT/ML Kwikpen Inject 75 Units into the skin 2 (two) times daily.  . hydrOXYzine (ATARAX/VISTARIL) 50 MG tablet Take 50 mg by mouth every 6  (six) hours as needed for itching.  . Insulin Pen Needle (B-D ULTRAFINE III SHORT PEN) 31G X 8 MM MISC 18/day as directed dx 250.00  . ivabradine (CORLANOR) 5 MG TABS tablet Take 1 tablet (5 mg total) by mouth 2 (two) times daily with a meal.  . levothyroxine (SYNTHROID, LEVOTHROID) 75 MCG tablet Take 75 mcg by mouth daily before breakfast.   . Melatonin 1 MG TABS Take 0.5 mg by mouth at bedtime.  . metolazone (ZAROXOLYN) 2.5 MG tablet Take 2.5 mg by mouth as needed (for weight gain >3 lbs).  . mometasone (NASONEX) 50 MCG/ACT nasal spray Place 1 spray into both nostrils daily. allergies  . nitroGLYCERIN (NITROSTAT) 0.4 MG SL tablet Place 1 tablet (0.4 mg total) under the tongue every 5 (five) minutes as needed. For chest pain  . pantoprazole (PROTONIX) 40 MG tablet Take 40 mg by mouth 2 (two) times daily.    . potassium chloride SA (K-DUR,KLOR-CON) 20 MEQ tablet Take 2 tablets (40 mEq total) by mouth 2 (two) times daily. Take an additional 40 meq with every dose of metolazone  . ramipril (ALTACE) 2.5 MG capsule Take 1 capsule (2.5 mg total) by mouth daily.  Marland Kitchen rOPINIRole (REQUIP) 1 MG tablet Take 1 mg by mouth at bedtime.    Marland Kitchen spironolactone (ALDACTONE) 25 MG tablet Take 25 mg by mouth daily.  . SUMAtriptan (IMITREX) 100 MG tablet Take 1  tablet by mouth daily as needed.  . tiotropium (SPIRIVA) 18 MCG inhalation capsule Place 18 mcg into inhaler and inhale daily.    . vitamin B-12 (CYANOCOBALAMIN) 1000 MCG tablet Take 1,000 mcg by mouth daily.  . vitamin C (ASCORBIC ACID) 500 MG tablet Take 500 mg by mouth daily.   No facility-administered encounter medications on file as of 01/02/2016.    Functional Status:  In your present state of health, do you have any difficulty performing the following activities: 12/21/2015 11/14/2015  Hearing? N -  Vision? N -  Difficulty concentrating or making decisions? N -  Walking or climbing stairs? N -  Dressing or bathing? N -  Doing errands, shopping? N Y     Fall/Depression Screening: PHQ 2/9 Scores 01/02/2016  PHQ - 2 Score 0    Assessment:   Heart Failure- Patient reports she continues to weigh daily and verbalizes understanding the medication plan for weight gain greater than 3 pounds in a day and 5 pounds in a week. Patient monitoring her salt intake and fluid intake reports reading all labels.   Diabetes Patient continues check her blood sugar at least 3 times a day, reports that she is eating better, taking in at least 45 grams of carbohydrates per meal and has a snack once a day.    Plan:  Patient will remain active with transition of care program, and weekly outreaches, home visit scheduled for next week. Patient will notify MD for worsening heart failure symptoms weight gain greater than 3 pounds in a day and 5 pounds in a week, shortness of breath or swelling.  [facility] CM Care Plan Problem One        Most Recent Value   Care Plan Problem One  Recent hospital admission   Role Documenting the Problem One  Care Management [place] for Problem One  Active   [facility] Long Term Goal (31-90 days)  Patient will not experience a hospital readmission in the next 31 days   [facility]N Long Term Goal Start Date  12/26/15   Interventions for Problem One Long Term Goal  Reinforced to  patient on the transition of program, provided my contact infomation, encouraged to notify MD of symptoms,follow discharge instructions, attend PCP appointment,    [facility]N CM Short Term Goal #1 (0-30 days)  Patient will attend post hospital MD visit in the next 14 days   [facility] CM Short Term Goal #1 Start Date  12/26/15   [facility] CM Short Term Goal #1 Met Date  01/02/16   THN CM Short Term Goal #2 (0-30 days)  Patient will be able to state symptoms of heart failure and action plan to take in the next 30 days   [facility]N CM Short Term Goal #2 Start Date  12/26/15   Interventions for Short Term Goal #2  Reinforced with patient the importance of taking medication per action plan  for weight gain and notifying   [facility]N CM Short Term Goal #3 (0-30 days)  Patient will continue to weigh and record daily in the next 30 days   [facility]N CM Short Term Goal #3 Start Date  12/26/15   Interventions for Short Tern Goal #3  Reviewed with patient to continue to weigh daily and record and take record to MD appointment     Joylene Draft, RN, [place] Management [phone]- Mobile [phone]- [place]

## 2016-01-04 ENCOUNTER — Telehealth (HOSPITAL_COMMUNITY): Payer: Self-pay

## 2016-01-04 NOTE — Telephone Encounter (Signed)
Attempted to reach patient concerning lab results, primary number off and mailbox is full, no answer on cell phone line.  Left VM with urgency to return our call asap.

## 2016-01-05 ENCOUNTER — Ambulatory Visit (INDEPENDENT_AMBULATORY_CARE_PROVIDER_SITE_OTHER): Payer: Medicare Other | Admitting: Gastroenterology

## 2016-01-05 ENCOUNTER — Telehealth (HOSPITAL_COMMUNITY): Payer: Self-pay | Admitting: *Deleted

## 2016-01-05 ENCOUNTER — Encounter: Payer: Self-pay | Admitting: Gastroenterology

## 2016-01-05 ENCOUNTER — Ambulatory Visit (HOSPITAL_COMMUNITY)
Admission: RE | Admit: 2016-01-05 | Discharge: 2016-01-05 | Disposition: A | Discharge status: Home / Self Care | Payer: Medicare Other | Source: Ambulatory Visit | Attending: Cardiology | Admitting: Cardiology

## 2016-01-05 ENCOUNTER — Telehealth: Payer: Self-pay

## 2016-01-05 VITALS — BP 102/60 | HR 88 | Ht 65.0 in | Wt 214.0 lb

## 2016-01-05 DIAGNOSIS — Z7901 Long term (current) use of anticoagulants: Secondary | ICD-10-CM | POA: Diagnosis not present

## 2016-01-05 DIAGNOSIS — K635 Polyp of colon: Secondary | ICD-10-CM | POA: Diagnosis not present

## 2016-01-05 DIAGNOSIS — K6389 Other specified diseases of intestine: Secondary | ICD-10-CM

## 2016-01-05 DIAGNOSIS — E876 Hypokalemia: Secondary | ICD-10-CM

## 2016-01-05 DIAGNOSIS — I509 Heart failure, unspecified: Secondary | ICD-10-CM | POA: Diagnosis not present

## 2016-01-05 DIAGNOSIS — I5022 Chronic systolic (congestive) heart failure: Secondary | ICD-10-CM

## 2016-01-05 DIAGNOSIS — D509 Iron deficiency anemia, unspecified: Secondary | ICD-10-CM

## 2016-01-05 DIAGNOSIS — I5023 Acute on chronic systolic (congestive) heart failure: Secondary | ICD-10-CM

## 2016-01-05 LAB — BASIC METABOLIC PANEL
Anion gap: 9 (ref 5–15)
BUN: 27 mg/dL — ABNORMAL HIGH (ref 6–20)
CALCIUM: 10.5 mg/dL — AB (ref 8.9–10.3)
CO2: 31 mmol/L (ref 22–32)
CREATININE: 1.07 mg/dL — AB (ref 0.44–1.00)
Chloride: 95 mmol/L — ABNORMAL LOW (ref 101–111)
GFR calc non Af Amer: 55 mL/min — ABNORMAL LOW (ref >60)
GLUCOSE: 111 mg/dL — AB (ref 65–99)
Potassium: 3.1 mmol/L — ABNORMAL LOW (ref 3.5–5.1)
Sodium: 135 mmol/L (ref 135–145)

## 2016-01-05 MED ORDER — POLYETHYLENE GLYCOL 3350 17 GM/SCOOP PO POWD: ORAL | Status: DC

## 2016-01-05 MED ORDER — POTASSIUM CHLORIDE CRYS ER 20 MEQ PO TBCR: 60.0000 meq | EXTENDED_RELEASE_TABLET | Freq: Two times a day (BID) | ORAL | Status: DC

## 2016-01-05 NOTE — Telephone Encounter (Signed)
  01/05/2016   RE: KENYELL SIRAK DOB: 1956-07-11 MRN: [id number]   Dear Dr. Haroldine Laws,    We need to schedule the above patient for an endoscopic procedure. Our records show that she is on anticoagulation therapy.   Please advise as to how long the patient may come off her therapy of Eliquis prior to the procedure that has yet to be scheduled.   Please route your answer to Marlon Pel, [place].  Sincerely,    Marlon Pel

## 2016-01-05 NOTE — Telephone Encounter (Signed)
-----   Message from Shirley Friar, PA-C sent at 01/05/2016  1:03 PM EDT ----- Please increase K to 60 meq BID as directed.   Please recheck end of next week.    Thanks!    Legrand Como [address]" [place], PA-C 01/05/2016 1:03 PM

## 2016-01-05 NOTE — Telephone Encounter (Signed)
Tried to reach patient to give lab results no voicemail box set up for patient.  Will try again this evening if i can not reach patient will mail letter.   Notes Recorded by Shirley Friar, PA-C on 01/02/2016 at 11:16 AM Increase K to 60 BID.   Had not been taking extra with metolazone, addressed today.   Should take 60 meq with her metolazone today (instead of 40 meq) and start the 60 BID tonight.  Should then take 40 meq extra on metolazone days, as discussed today.   She really needs recheck Friday.  OK to do at a [facility] if she cannot make it back here.

## 2016-01-05 NOTE — Telephone Encounter (Signed)
Notes Recorded by Scarlette Calico, RN on 01/05/2016 at 1:07 PM Pt aware and agreeable, new rx sent in, repeat labs sch 6/9

## 2016-01-05 NOTE — Patient Instructions (Addendum)
We have sent the following medications to your pharmacy for you to pick up at your convenience:Miralax.    Please come back to the lab the week of June 26th to have your repeat blood count.  We will contact you regarding scheduling your Upper Endoscopy with push enteroscopy.   You will be contaced by our office prior to your procedure for directions on holding your Eliquis.  If you do not hear from our office 1 week prior to your scheduled procedure, please call [phone] to discuss.

## 2016-01-05 NOTE — Progress Notes (Signed)
HPI :  60 y/o female with multiple comorbidities who was admitted in April for symptomatic anemia and "dark stools", in the setting of using Eliquis for AF. Presented with Hgb of 7s with BUN > Cr. Workup as follows: EGD 11/16/15 - showed some benign gastric polyps and 2 duodenal polyps, not removed given possible need for capsule, no cause for anemia Colonoscopy 11/17/15 - One 5 mm polyp in the proximal descending colon, moderate diverticulosis in the left colon, melanosis in the colon. Capsule study - 12/11/15 - duodenal polyps, small amount of oozing noted in duodenum, small bowel AVM x 1, mild distal bowel erythema  She reports feeling fatigued which has continued since discharge. Admitted 12/21/15 with increased dyspnea and chest pain. Found to have CHF exacerbation with EF 20%. Additionally, a new breast lump was discovered. She diuresed 15 lbs. She is back on Eliquis. She denies any shortness of breath, ambulating without difficulty. She has been maintaining her weight and seeing Dr. Tempie Hoist. She reports having dark brown stools. She is having constipation which has been present for the past several months. She is not taking an iron supplement, she states she doesn't tolerate it due to upset stomach. She has had one bowel movement in the past week.     Past Medical History  Diagnosis Date  . 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 DYSLIPIDEMIA 05/01/2009  . OBESITY 07/24/2007  . OBSTRUCTIVE SLEEP APNEA 07/24/2007    with CPAP compliance  . RESTLESS LEG SYNDROME 07/24/2007  . SYSTOLIC HEART FAILURE, CHRONIC 01/05/2009    a. chemo induced cardiomyopathy b. cMRI (02/2009) EF 45% c. Myoview 05/2010: EF 59%, no ischemia d. RHC (01/2012) RA 20, RV 55/13/22, PA 56/34 (44), PCWP 28, Fick CO/CI: 3.1 / 1.5, PVR 5.3 WU, PA sat 42% & 48% e. ECHO (10/2012) EF 20-25%, diff HK, MV calcified annulus f. ECHO (03/2014) EF 30-35%, sev HK, inferior/inferoseptal walls, mild MR  . 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 Hypertension   .  History of breast cancer 2006    with return in 2008, s/p mastectomy, XRT and chemotherapy  . Osteoarthritis   . Anxiety   . Depression     followed by a psychiatrist  . Asthma   . GERD (gastroesophageal reflux disease)   . Gastroparesis   . IBS (irritable bowel syndrome)   . Myocardial infarction ([place])   . ICD (implantable cardiac defibrillator) in place 11/10/2012    ICD (11/2012)  . COPD (chronic obstructive pulmonary disease) ([place])   . Persistent atrial fibrillation ([place]) 09/2014  . AICD (automatic cardioverter/defibrillator) present   . Presence of permanent cardiac pacemaker   . CHF (congestive heart failure) [facility])      Past Surgical History  Procedure Laterality Date  . Mastectomy      right  . Abdominal hysterectomy  1991  . Cardiac defibrillator placement  11/10/2012    SJM Fortify Assura VR implanted by Dr Rayann Heman for primary prevention  . Cholecystectomy    . Wrist surgery Bilateral   . Knee cartilage surgery    . Bi-ventricular implantable cardioverter defibrillator N/A 11/10/2012    SJM Forify Assura single chamber ICD  . Cardiac catheterization N/A 08/21/2015    Procedure: Right/Left Heart Cath and Coronary Angiography;  Surgeon: Jolaine Artist, MD;  Location: [place] [facility];  Service: Cardiovascular;  Laterality: N/A;  . Esophagogastroduodenoscopy N/A 11/16/2015    Procedure: ESOPHAGOGASTRODUODENOSCOPY (EGD);  Surgeon: Manus Gunning, MD;  Location: [place];  Service: Gastroenterology;  Laterality: N/A;  .  Colonoscopy N/A 11/17/2015    Procedure: COLONOSCOPY;  Surgeon: Jerene Bears, MD;  Location: [facility] ENDOSCOPY;  Service: Endoscopy;  Laterality: N/A;  . Givens capsule study N/A 12/11/2015    Procedure: GIVENS CAPSULE STUDY;  Surgeon: Manus Gunning, MD;  Location: [place];  Service: Gastroenterology;  Laterality: N/A;   Family History  Problem Relation Age of Onset  . Diabetes Mellitus II Mother   . Coronary artery disease Father      CABG x 3  . Breast cancer Cousin   . Breast cancer Cousin   . Uterine cancer Cousin   . Lung cancer Mother   . Congestive Heart Failure Mother   . Hypertension Father   . Diabetes Mellitus II Father    Social History  Substance Use Topics  . Smoking status: Former Smoker    Quit date: 04/12/2001  . Smokeless tobacco: Never Used  . Alcohol Use: No   Current Outpatient Prescriptions  Medication Sig Dispense Refill  . acetaminophen (TYLENOL ARTHRITIS PAIN) 650 MG CR tablet Take 1,300 mg by mouth every 8 (eight) hours as needed. pain    . albuterol (PROVENTIL HFA) 108 (90 BASE) MCG/ACT inhaler Inhale 2 puffs into the lungs every 6 (six) hours as needed. For shortness of breath    . ALPRAZolam (XANAX) 0.5 MG tablet Take 0.5 mg by mouth daily as needed for anxiety.     Marland Kitchen amiodarone (PACERONE) 200 MG tablet Take 1 tablet (200 mg total) by mouth daily. 30 tablet 6  . atorvastatin (LIPITOR) 40 MG tablet Take 40 mg by mouth daily.      . busPIRone (BUSPAR) 30 MG tablet Take 30 mg by mouth 2 (two) times daily.     . carvedilol (COREG) 6.25 MG tablet Take 1 tablet (6.25 mg total) by mouth 2 (two) times daily with a meal. 60 tablet 0  . digoxin (LANOXIN) 0.125 MG tablet Take 1 tablet (0.125 mg total) by mouth every other day. 90 tablet 3  . docusate sodium (COLACE) 100 MG capsule Take 3 capsules (300 mg total) by mouth at bedtime. 10 capsule 0  . DULoxetine (CYMBALTA) 30 MG capsule Take 30 mg by mouth daily.    . furosemide (LASIX) 20 MG tablet 40 mg twice a day (Patient taking differently: Take 40 mg by mouth 2 (two) times daily. ) 60 tablet 3  . Glucosamine 500 MG CAPS Take 1 capsule by mouth daily.      Marland Kitchen HUMALOG KWIKPEN 100 UNIT/ML KiwkPen Inject 7-34 Units into the skin 3 (three) times daily. Sliding scale    . HUMALOG MIX 75/25 KWIKPEN (75-25) 100 UNIT/ML Kwikpen Inject 75 Units into the skin 2 (two) times daily.    . hydrOXYzine (ATARAX/VISTARIL) 50 MG tablet Take 50 mg by mouth every 6  (six) hours as needed for itching.    . Insulin Pen Needle (B-D ULTRAFINE III SHORT PEN) 31G X 8 MM MISC 18/day as directed dx 250.00 500 each 11  . ivabradine (CORLANOR) 5 MG TABS tablet Take 1 tablet (5 mg total) by mouth 2 (two) times daily with a meal. 60 tablet 3  . levothyroxine (SYNTHROID, LEVOTHROID) 75 MCG tablet Take 75 mcg by mouth daily before breakfast.     . Melatonin 1 MG TABS Take 0.5 mg by mouth at bedtime.    . metolazone (ZAROXOLYN) 2.5 MG tablet Take 2.5 mg by mouth as needed (for weight gain >3 lbs).    . mometasone (NASONEX) 50 MCG/ACT nasal spray  Place 1 spray into both nostrils daily. allergies    . nitroGLYCERIN (NITROSTAT) 0.4 MG SL tablet Place 1 tablet (0.4 mg total) under the tongue every 5 (five) minutes as needed. For chest pain 25 tablet 3  . pantoprazole (PROTONIX) 40 MG tablet Take 40 mg by mouth 2 (two) times daily.      . potassium chloride SA (K-DUR,KLOR-CON) 20 MEQ tablet Take 2 tablets (40 mEq total) by mouth 2 (two) times daily. Take an additional 40 meq with every dose of metolazone 160 tablet 6  . ramipril (ALTACE) 2.5 MG capsule Take 1 capsule (2.5 mg total) by mouth daily. 30 capsule 0  . rOPINIRole (REQUIP) 1 MG tablet Take 1 mg by mouth at bedtime.      Marland Kitchen spironolactone (ALDACTONE) 25 MG tablet Take 25 mg by mouth daily.    . SUMAtriptan (IMITREX) 100 MG tablet Take 1 tablet by mouth daily as needed.    . tiotropium (SPIRIVA) 18 MCG inhalation capsule Place 18 mcg into inhaler and inhale daily.      . vitamin B-12 (CYANOCOBALAMIN) 1000 MCG tablet Take 1,000 mcg by mouth daily.    . vitamin C (ASCORBIC ACID) 500 MG tablet Take 500 mg by mouth daily.     No current facility-administered medications for this visit.   Allergies  Allergen Reactions  . Bee Venom Anaphylaxis  . Other Other (See Comments)    ANY TYPES OF METAL-CAUSES BLISTERS SKIN AND ITCHING   . Aspirin Hives  . Ciprofloxacin Hives  . Codeine Nausea Only    "can take ONLY if she  eats with med"  . Naproxen Hives  . Sulfonamide Derivatives Hives  . Xarelto [Rivaroxaban] Hives  . Nsaids Itching and Rash  . Tape Rash    Also allergic to metal     Review of Systems: All systems reviewed and negative except where noted in HPI.    Dg Chest 2 View  12/20/2015  CLINICAL DATA:  Chest pain since this morning. Shortness of breath for 2.5 weeks. Bilateral ankle swelling for 3 weeks. History of right breast cancer. Former smoker. EXAM: CHEST  2 VIEW COMPARISON:  11/14/2015 FINDINGS: Cardiac pacemaker. Mild cardiac enlargement with mild pulmonary vascular congestion. Interstitial changes in the lungs suggest mild edema. No focal airspace disease or consolidation. No blunting of costophrenic angles. No pneumothorax. Hyperinflation suggesting underlying emphysematous change. Postoperative right mastectomy. Calcification of the aorta. IMPRESSION: Cardiac enlargement with mild pulmonary vascular congestion interstitial edema. Electronically Signed   By: Lucienne Capers M.D.   On: 12/20/2015 22:18   Dg Abd 1 View  12/19/2015  CLINICAL DATA:  Evaluate for passage of a Givens capsule. EXAM: ABDOMEN - 1 VIEW COMPARISON:  None in PACs FINDINGS: The strain lateral aspects of both flanks are excluded from the view. The inferior aspect of the rectum is excluded. No Givens capsule is observed. The bowel gas pattern is normal. There surgical clips in the gallbladder fossa. IMPRESSION: No Givens capsule is demonstrated in the field of view of the abdominal study today. Electronically Signed   By: David  Martinique M.D.   On: 12/19/2015 13:53   Lab Results  Component Value Date   WBC 8.2 12/21/2015   HGB 10.4* 12/21/2015   HCT 33.6* 12/21/2015   MCV 92.6 12/21/2015   PLT 319 12/21/2015    CBC Latest Ref Rng 12/21/2015 12/20/2015 12/19/2015  WBC 4.0 - 10.5 K/uL 8.2 8.5 8.2  Hemoglobin 12.0 - 15.0 g/dL 10.4(L) 10.2(L) 10.4(L)  Hematocrit  36.0 - 46.0 % 33.6(L) 31.4(L) 31.5(L)  Platelets 150 - 400  K/uL 319 304 287.0    Lab Results  Component Value Date   ALT 21 12/21/2015   AST 23 12/21/2015   ALKPHOS 58 12/21/2015   BILITOT 1.2 12/21/2015   Lab Results  Component Value Date   IRON 54 12/19/2015   TIBC 560* 11/14/2015   FERRITIN 40.2 12/19/2015      Physical Exam: BP 102/60 mmHg  Pulse 88  Ht 5\' 5"  (1.651 m)  Wt 214 lb (97.07 kg)  BMI 35.61 kg/m2 Constitutional: Pleasant, female in no acute distress. HEENT: Normocephalic and atraumatic. Conjunctivae are normal. No scleral icterus. Neck supple.  Cardiovascular: Normal rate, regular rhythm.  Pulmonary/chest: Effort normal and breath sounds normal. No wheezing, rales or rhonchi. Abdominal: Soft, nondistended, nontender, protuberant. Bowel sounds active throughout. There are no masses palpable.  Extremities: trace edema B LE Lymphadenopathy: No cervical adenopathy noted. Neurological: Alert and oriented to person place and time. Skin: Skin is warm and dry. No rashes noted. Psychiatric: Normal mood and affect. Behavior is normal.   ASSESSMENT AND PLAN: 60 y/o female with multiple medical problems as outlined above, who presented in April with iron deficiency anemia (ferritin 6) with Hgb of 7, black stools, in the setting of Eliquis. EGD, colonoscopy, and capsule as outlined above. Overall, suspect she has had a bleeding from small bowel source (AVM?) but not clearly seen on capsule. She otherwise has some duodenal polyps which could be adenomas and need to be removed. She has not been on oral iron as she has not tolerated it but her Hgb has improved since discharge as has her iron stores. Unfortunately she has had recent cardiac issues with CHF admission a few weeks ago, EF of 20%.   At this point, she has had no further blood loss despite being on Eliquis and Hgb is improved, she will continue her cardiac regimen for now. I would recheck CBC in 3-4 weeks to ensure stable. I recommend a repeat EGD with enteroscopy in the  upcoming weeks once further out from her recent hospitalization to remove duodenal polyps and try to assess for the source of possible bleeding noted on capsule. She will need to hold Eliquis for 3 days prior to this procedure and will ask Cardiology if she is okay to do this and if she is stable for this procedure. She agreed with the plan. Enteroscopy will be done at the hospital given her cardiac issues.   [place] Cellar, MD [facility] Pager [phone]  CC: Nicoletta Dress, MD

## 2016-01-08 ENCOUNTER — Telehealth: Payer: Self-pay | Admitting: Cardiology

## 2016-01-08 ENCOUNTER — Other Ambulatory Visit: Payer: Self-pay | Admitting: *Deleted

## 2016-01-08 ENCOUNTER — Telehealth: Payer: Self-pay | Admitting: *Deleted

## 2016-01-08 ENCOUNTER — Encounter: Payer: Self-pay | Admitting: *Deleted

## 2016-01-08 VITALS — BP 110/60 | HR 88 | Resp 18 | Wt 214.6 lb

## 2016-01-08 DIAGNOSIS — Z794 Long term (current) use of insulin: Principal | ICD-10-CM

## 2016-01-08 DIAGNOSIS — K6389 Other specified diseases of intestine: Secondary | ICD-10-CM

## 2016-01-08 DIAGNOSIS — I5022 Chronic systolic (congestive) heart failure: Secondary | ICD-10-CM

## 2016-01-08 DIAGNOSIS — G4733 Obstructive sleep apnea (adult) (pediatric): Secondary | ICD-10-CM

## 2016-01-08 DIAGNOSIS — E118 Type 2 diabetes mellitus with unspecified complications: Secondary | ICD-10-CM

## 2016-01-08 NOTE — Patient Outreach (Signed)
[facility]) Care Management   01/08/2016  [name] 06/30/1956 [id number]  [name] is an 60 y.o. female  Subjective:  Heart Failure : Patient discussed getting off her diet in the last few days,and went over by salt limit. Diabetes: Patient discussed her record keeping of blood sugars and sliding scale insulin, her meal planning ,  Medications : [name] discussed that she is using samples for her eliquis, due to needing prior authorization from MD to insurance . Breast Lump : [name] discussed that she has recently felt a lump at left breast area and plans to contact MD office for follow up.     Objective:  BP 110/60 mmHg  Pulse 88  Resp 18  Wt 214 lb 9.6 oz (97.342 kg)  SpO2 97% Review of Systems  Constitutional: Negative.   HENT: Negative.   Eyes: Negative.   Respiratory: Negative.   Cardiovascular: Positive for leg swelling.  Gastrointestinal: Negative.   Genitourinary: Negative.   Musculoskeletal: Negative.   Skin: Negative.   Neurological: Negative.   Endo/Heme/Allergies: Negative.   Psychiatric/Behavioral: Negative.     Physical Exam  Constitutional: She is oriented to person, place, and time. She appears well-developed and well-nourished.  Cardiovascular: Normal rate, normal heart sounds and intact distal pulses.   Respiratory: Effort normal.  GI: Soft.  Neurological: She is alert and oriented to person, place, and time.  Skin: Skin is warm and dry.  Psychiatric: She has a normal mood and affect. Her behavior is normal. Judgment and thought content normal.   Patient was recently discharged from hospital and all medications have been reviewed. Encounter Medications:   Outpatient Encounter Prescriptions as of 01/08/2016  Medication Sig  . acetaminophen (TYLENOL ARTHRITIS PAIN) 650 MG CR tablet Take 1,300 mg by mouth every 8 (eight) hours as needed. pain  . albuterol (PROVENTIL HFA) 108 (90 BASE) MCG/ACT inhaler Inhale  2 puffs into the lungs every 6 (six) hours as needed. For shortness of breath  . ALPRAZolam (XANAX) 0.5 MG tablet Take 0.5 mg by mouth daily as needed for anxiety.   Marland Kitchen amiodarone (PACERONE) 200 MG tablet Take 1 tablet (200 mg total) by mouth daily.  Marland Kitchen apixaban (ELIQUIS) 5 MG TABS tablet Take 5 mg by mouth 2 (two) times daily.  Marland Kitchen atorvastatin (LIPITOR) 40 MG tablet Take 40 mg by mouth daily.    . baclofen (LIORESAL) 20 MG tablet Take 20 mg by mouth 2 (two) times daily as needed for muscle spasms.  . busPIRone (BUSPAR) 30 MG tablet Take 30 mg by mouth 2 (two) times daily.   . carvedilol (COREG) 6.25 MG tablet Take 1 tablet (6.25 mg total) by mouth 2 (two) times daily with a meal.  . digoxin (LANOXIN) 0.125 MG tablet Take 1 tablet (0.125 mg total) by mouth every other day.  . docusate sodium (COLACE) 100 MG capsule Take 3 capsules (300 mg total) by mouth at bedtime.  . DULoxetine (CYMBALTA) 30 MG capsule Take 30 mg by mouth daily.  . furosemide (LASIX) 20 MG tablet 40 mg twice a day (Patient taking differently: Take 40 mg by mouth 2 (two) times daily. )  . Glucosamine 500 MG CAPS Take 1 capsule by mouth daily.    Marland Kitchen HUMALOG KWIKPEN 100 UNIT/ML KiwkPen Inject 7-34 Units into the skin 3 (three) times daily. Sliding scale TID  . HUMALOG MIX 75/25 KWIKPEN (75-25) 100 UNIT/ML Kwikpen Inject 75 Units into the skin 2 (two) times daily.  . hydrOXYzine (ATARAX/VISTARIL)  50 MG tablet Take 50 mg by mouth every 6 (six) hours as needed for itching.  . Insulin Pen Needle (B-D ULTRAFINE III SHORT PEN) 31G X 8 MM MISC 18/day as directed dx 250.00  . ivabradine (CORLANOR) 5 MG TABS tablet Take 1 tablet (5 mg total) by mouth 2 (two) times daily with a meal.  . levothyroxine (SYNTHROID, LEVOTHROID) 75 MCG tablet Take 75 mcg by mouth daily before breakfast.   . Melatonin 1 MG TABS Take 0.5 mg by mouth at bedtime.  . metolazone (ZAROXOLYN) 2.5 MG tablet Take 2.5 mg by mouth as needed (for weight gain >3 lbs).  .  mometasone (NASONEX) 50 MCG/ACT nasal spray Place 1 spray into both nostrils daily. allergies  . Multiple Minerals (CALCIUM-MAGNESIUM-ZINC) TABS Take 1 tablet by mouth.  . nitroGLYCERIN (NITROSTAT) 0.4 MG SL tablet Place 1 tablet (0.4 mg total) under the tongue every 5 (five) minutes as needed. For chest pain  . pantoprazole (PROTONIX) 40 MG tablet Take 40 mg by mouth 2 (two) times daily.    . polyethylene glycol powder (GLYCOLAX/MIRALAX) powder Mix 17 grams in 8 oz of water twice daily  . potassium chloride SA (K-DUR,KLOR-CON) 20 MEQ tablet Take 3 tablets (60 mEq total) by mouth 2 (two) times daily. Take an additional 40 meq with every dose of metolazone  . pyridOXINE (VITAMIN B-6) 100 MG tablet Take 100 mg by mouth daily.  . ramipril (ALTACE) 2.5 MG capsule Take 1 capsule (2.5 mg total) by mouth daily.  Marland Kitchen rOPINIRole (REQUIP) 1 MG tablet Take 1 mg by mouth at bedtime.    . Simethicone Extra Strength 125 MG CAPS Take by mouth.  . spironolactone (ALDACTONE) 25 MG tablet Take 25 mg by mouth daily.  . SUMAtriptan (IMITREX) 100 MG tablet Take 1 tablet by mouth daily as needed.  . tiotropium (SPIRIVA) 18 MCG inhalation capsule Place 18 mcg into inhaler and inhale daily.    . vitamin B-12 (CYANOCOBALAMIN) 1000 MCG tablet Take 1,000 mcg by mouth daily.  . vitamin C (ASCORBIC ACID) 500 MG tablet Take 500 mg by mouth daily.   No facility-administered encounter medications on file as of 01/08/2016.    Functional Status:   In your present state of health, do you have any difficulty performing the following activities: 01/08/2016 12/21/2015  Hearing? N N  Vision? N N  Difficulty concentrating or making decisions? N N  Walking or climbing stairs? N N  Dressing or bathing? N N  Doing errands, shopping? N N  Preparing Food and eating ? N -  Using the Toilet? N -  In the past six months, have you accidently leaked urine? N -  Do you have problems with loss of bowel control? N -  Managing your Medications?  N -  Managing your Finances? N -  Housekeeping or managing your Housekeeping? N -    Fall/Depression Screening:    PHQ 2/9 Scores 01/02/2016  PHQ - 2 Score 0    Assessment:  Initial transition of care home visit note Mrs.Tweedy recently moved into to her small camper that is behind her brothers home as she assist him at times. Patient's home is neat and she has her frequently used items and medication in a dedicated area.      Heart Failure Mrs. Crosley weighs daily and has a very organized system for documenting her daily weights. Discussed adherence to sodium restrictions in diet, Mrs. Flath tries to limit her sodium to 1200 to 1500 mg per day.  Reinforced heart failure zones and importance of notifying MD of weight gains greater than 3 pounds in a day,5 pounds in a week, and yellow action plan.  Patient CPAP at night.  Diabetes Mrs.Santillana checks blood sugars at least 3 times a day, takes insulin as prescribed and per sliding scale,blood sugar on this am 309 , in observing patient record and meter several readings 200 to 300's,  patient reports one recent episode of hypoglycemic. Patient meter does not track 30 day averages, 11/14/15 A1c 7.3 . Mrs.Gebert keeps a daily record of her meal plans and aims to eat 45 grams of carbohydrates  per day.   Medications Patient uses daily pill organizer, patient on greater than 25 medications, will get [facility] consult  medication review. Mrs.Zaucha has a one  week supply of Eliquis at present.   Patient centered care management goals reviewed.  Plan:  RN notified Crystal at PCP office  of patient needing prior authorization on her prescription for Eliquis at [facility] [facility] pharmacy consult for medication review. RN will follow up with patient by end of week to make sure she has received her Eliquis prescription. RN will continue education on heart failure management,with patient and continue weekly outreaches with  transition of care program. Educated on rule of 15 for treating hypoglycemia. Patient will notify MD for worsening heart failure symptoms.  THN CM Care Plan Problem One        Most Recent Value   Care Plan Problem One  Recent hospital admission   Role Documenting the Problem One  Care Management [place] for Problem One  Active   THN Long Term Goal (31-90 days)  Patient will not experience a hospital readmission in the next 31 days   THN Long Term Goal Start Date  12/26/15   Interventions for Problem One Long Term Goal  Provided and reviewed [facility] handout of heart failure: working with your Doctor   [facility] CM Short Term Goal #1 (0-30 days)  Patient will attend post hospital MD visit in the next 14 days   THN CM Short Term Goal #1 Start Date  12/26/15   [facility] CM Short Term Goal #1 Met Date  01/02/16   THN CM Short Term Goal #2 (0-30 days)  Patient will be able to state symptoms of heart failure and action plan to take in the next 30 days   THN CM Short Term Goal #2 Start Date  12/26/15   Interventions for Short Term Goal #2  Provided and reviewed Heart failure magnet education  on zones,    THN CM Short Term Goal #3 (0-30 days)  Patient will continue to weigh and record daily in the next 30 days   THN CM Short Term Goal #3 Start Date  12/26/15   Interventions for Short Tern Goal #3  Educated on importance of weighing daily, to tract small changes in day to day weight to notify MD sooner of yellow zone symptoms   THN CM Short Term Goal #4 (0-30 days)  Patient will report increased knowledge and adherence to low salt diet in the next 30 days    THN CM Short Term Goal #4 Start Date  01/08/16   Interventions for Short Term Goal #4  Educated on importance of monitoring salt intake, and adhering to plan, reviewed Emmi hanout on being salt smart.and low high salt foods picture handout    THN CM Short Term Goal #5 (0-30 days)  Patient  will receive prior authorization for Eliquis in the next 7  days   THN CM Short Term Goal #5 Start Date  01/08/16   Interventions for Short Term Goal #5  RN placed call to MD office to notify that patient needs prior authorization for Eliquis prescription at [facility]     Joylene Draft, RN, [facility] [phone]- Mobile [phone]- [place]

## 2016-01-08 NOTE — Telephone Encounter (Signed)
Left a message for patient to call back. 

## 2016-01-08 NOTE — Telephone Encounter (Signed)
LMOVM requesting that pt send manual transmission b/c home monitor has not updated in at least 8 days.   

## 2016-01-09 ENCOUNTER — Telehealth (HOSPITAL_COMMUNITY): Payer: Self-pay | Admitting: Pharmacist

## 2016-01-09 NOTE — Telephone Encounter (Signed)
Eliquis 5 mg BID PA approved by [employer] through 08/04/16.   Ruta Hinds. Velva Harman, PharmD, BCPS, CPP Clinical Pharmacist Pager: [phone] Phone: [phone] 01/09/2016 3:23 PM

## 2016-01-10 ENCOUNTER — Other Ambulatory Visit: Payer: Self-pay | Admitting: *Deleted

## 2016-01-10 DIAGNOSIS — K6389 Other specified diseases of intestine: Secondary | ICD-10-CM

## 2016-01-10 DIAGNOSIS — Q8589 Other phakomatoses, not elsewhere classified: Secondary | ICD-10-CM

## 2016-01-10 DIAGNOSIS — Q858 Other phakomatoses, not elsewhere classified: Secondary | ICD-10-CM

## 2016-01-10 NOTE — Telephone Encounter (Signed)
Needs EGD with enteroscopy to remove polyps.

## 2016-01-10 NOTE — Telephone Encounter (Signed)
Scheduled at [facility] with Sharee Pimple for 03/26/16 at 9:15 AM. Pre visit scheduled on 03/19/16 at 2:00 PM.

## 2016-01-10 NOTE — Telephone Encounter (Signed)
Left a message for patient to call back re: scheduling procedure at hospital.

## 2016-01-11 ENCOUNTER — Encounter (HOSPITAL_COMMUNITY): Payer: Self-pay | Admitting: *Deleted

## 2016-01-11 ENCOUNTER — Other Ambulatory Visit: Payer: Self-pay | Admitting: *Deleted

## 2016-01-11 NOTE — Telephone Encounter (Signed)
Letter mailed

## 2016-01-11 NOTE — Patient Outreach (Signed)
[facility]) Care Management  01/11/2016  [name] 1955/10/29 [id number]  Telephone assessment Follow up telephone call to [name] regarding whether she has been able to get her Eliquis. Noted prior authorization received.   RN placed call to [facility] and they have received necessary information , encouraged  patient to contact pharmacy regarding picking up prescription.  Plan RN will follow up with patient on next week as part of the transition of care program.  Joylene Draft, RN, [place] [facility] [phone]- Mobile [phone]- [place]

## 2016-01-12 ENCOUNTER — Ambulatory Visit (HOSPITAL_COMMUNITY)
Admission: RE | Admit: 2016-01-12 | Discharge: 2016-01-12 | Disposition: A | Discharge status: Home / Self Care | Payer: Medicare Other | Source: Ambulatory Visit | Attending: Cardiology | Admitting: Cardiology

## 2016-01-12 ENCOUNTER — Telehealth (HOSPITAL_COMMUNITY): Payer: Self-pay | Admitting: *Deleted

## 2016-01-12 DIAGNOSIS — I5022 Chronic systolic (congestive) heart failure: Secondary | ICD-10-CM

## 2016-01-12 DIAGNOSIS — E876 Hypokalemia: Secondary | ICD-10-CM

## 2016-01-12 DIAGNOSIS — I509 Heart failure, unspecified: Secondary | ICD-10-CM

## 2016-01-12 LAB — BASIC METABOLIC PANEL
ANION GAP: 11 (ref 5–15)
BUN: 26 mg/dL — AB (ref 6–20)
CALCIUM: 10.3 mg/dL (ref 8.9–10.3)
CO2: 29 mmol/L (ref 22–32)
Chloride: 96 mmol/L — ABNORMAL LOW (ref 101–111)
Creatinine, Ser: 1.15 mg/dL — ABNORMAL HIGH (ref 0.44–1.00)
GFR calc Af Amer: 59 mL/min — ABNORMAL LOW (ref >60)
GFR, EST NON AFRICAN AMERICAN: 51 mL/min — AB (ref >60)
GLUCOSE: 86 mg/dL (ref 65–99)
POTASSIUM: 3.2 mmol/L — AB (ref 3.5–5.1)
SODIUM: 136 mmol/L (ref 135–145)

## 2016-01-12 LAB — CBC
HEMATOCRIT: 31.8 % — AB (ref 36.0–46.0)
Hemoglobin: 9.8 g/dL — ABNORMAL LOW (ref 12.0–15.0)
MCH: 27.1 pg (ref 26.0–34.0)
MCHC: 30.8 g/dL (ref 30.0–36.0)
MCV: 88.1 fL (ref 78.0–100.0)
Platelets: 390 10*3/uL (ref 150–400)
RBC: 3.61 MIL/uL — ABNORMAL LOW (ref 3.87–5.11)
RDW: 16.7 % — AB (ref 11.5–15.5)
WBC: 9.3 10*3/uL (ref 4.0–10.5)

## 2016-01-12 NOTE — Addendum Note (Signed)
Encounter addended by: Harvie Junior, CMA on: 01/12/2016 11:14 AM<BR>     Documentation filed: Visit Diagnoses, Dx Association, Orders

## 2016-01-12 NOTE — Telephone Encounter (Signed)
Notes Recorded by Harvie Junior, CMA on 01/12/2016 at 3:26 PM Pt aware of lab results and medication changes. Pt will has been taking medications as prescribed she will increase K to 60/40/60. Pt added to lab schedule 01/22/16 Notes Recorded by Shirley Friar, PA-C on 01/12/2016 at 1:28 PM K remains markedly low.   Please make sure she is taking Spiro 25 mg daily and 60 meq K BID as ordered, if not, simply change to this.  If she is, Needs to increase K to 60/40/60 (spacing K out for better absorption) and get recheck next week and check labs 10 days.    Legrand Como [id number] [place], PA-C 01/12/2016 1:24 PM

## 2016-01-15 ENCOUNTER — Other Ambulatory Visit: Payer: Self-pay | Admitting: *Deleted

## 2016-01-15 NOTE — Patient Outreach (Signed)
[facility]) Care Management  01/15/2016  ADYLEE LEONARDO 02-Oct-1955 [id number]   Transition of care call.  Mrs.Garver reports that she is doing well, patient denies any shortness of breath, increased weight gain or swelling,today's weight is 211. Patient in green zone of heart failure.  Mrs.Amato reports taking her potassium as prescribed,  patient has picked her Eliquis prescription from pharmacy.  Follow up; Patient discussed her follow up lab to recheck potassium  scheduled in 1 week.   Plan Patient will remain active with transition of care program and weekly outreaches, next call scheduled within a week. Review care progression goals and update.  [facility] CM Care Plan Problem One        Most Recent Value   Care Plan Problem One  Recent hospital admission   Role Documenting the Problem One  Care Management [place] for Problem One  Active   THN Long Term Goal (31-90 days)  Patient will not experience a hospital readmission in the next 31 days   THN Long Term Goal Start Date  12/26/15   Interventions for Problem One Long Term Goal  Reviewed importance of taking all medications as prescribed   THN CM Short Term Goal #1 (0-30 days)  Patient will attend post hospital MD visit in the next 14 days   THN CM Short Term Goal #1 Start Date  12/26/15   [facility] CM Short Term Goal #1 Met Date  01/02/16   THN CM Short Term Goal #2 (0-30 days)  Patient will be able to state symptoms of heart failure and action plan to take in the next 30 days   THN CM Short Term Goal #2 Start Date  12/26/15   Interventions for Short Term Goal #2  Reviewed which color on Heart failure patient is in today, and reinforced actions to take in yellow  with teachback   THN CM Short Term Goal #3 (0-30 days)  Patient will continue to weigh and record daily in the next 30 days   THN CM Short Term Goal #3 Start Date  12/26/15   [facility] CM Short Term Goal #3 Met Date  01/15/16   THN CM  Short Term Goal #4 (0-30 days)  Patient will report increased knowledge and adherence to low salt diet in the next 30 days    THN CM Short Term Goal #4 Start Date  01/08/16   Interventions for Short Term Goal #4  Reinforced importance of watching salt in foods that she eats, and rationale   THN CM Short Term Goal #5 (0-30 days)  Patient will receive prior authorization for Eliquis in the next 7 days   THN CM Short Term Goal #5 Start Date  01/08/16   [facility] CM Short Term Goal #5 Met Date  01/15/16     Joylene Draft, RN, [place] Management [phone]- Mobile [phone]- [place]

## 2016-01-22 ENCOUNTER — Other Ambulatory Visit: Payer: Self-pay | Admitting: *Deleted

## 2016-01-22 ENCOUNTER — Other Ambulatory Visit (HOSPITAL_COMMUNITY): Payer: Self-pay

## 2016-01-22 NOTE — Patient Outreach (Signed)
[facility]) Care Management  01/22/2016  [name] 31-May-1956 [id number]   Transition of care call,  Patient report that she is doing well, has been out shopping on today. [name] reports today's weight is 210.8, without increases in weight greater than 3 pounds in a day or 5 pounds in a week. Patient denies increased in shortness of breath or swellings reports taking medication as prescribed.  [name] states her blood sugars have been running a little better, less episodes of blood sugars in the 300's, average blood sugar readings yesterday 196. Patient denies having any hypoglycemic episodes, states she continues to monitor her amount of carbohydrate at each meals.   Patient discussed her appointment at Atrial Fib clinic in am.  Discussed [facility] care management Health coach program, for further education and management of chronic disease patient in agreement when appropriate.  Patient denies any new concerns at this time.   Plan Patient will receive final transition of care call on next week, will evaluate  for further care management needs,and community care management visits or transition to health coach for continued education .  [facility] CM Care Plan Problem One        Most Recent Value   Care Plan Problem One  Recent hospital admission   Role Documenting the Problem One  Care Management [place] for Problem One  Active   THN Long Term Goal (31-90 days)  Patient will not experience a hospital readmission in the next 31 days   THN Long Term Goal Start Date  12/26/15   Interventions for Problem One Long Term Goal  Encouraged patient to notify MD of new symptoms or concerns sooner.   THN CM Short Term Goal #1 (0-30 days)  Patient will attend post hospital MD visit in the next 14 days   THN CM Short Term Goal #1 Start Date  12/26/15   [facility] CM Short Term Goal #1 Met Date  01/02/16   THN CM Short Term Goal #2 (0-30 days)  Patient will be  able to state symptoms of heart failure and action plan to take in the next 30 days   THN CM Short Term Goal #2 Start Date  12/26/15   [facility] CM Short Term Goal #2 Met Date  01/22/16   THN CM Short Term Goal #3 (0-30 days)  Patient will continue to weigh and record daily in the next 30 days   THN CM Short Term Goal #3 Start Date  12/26/15   [facility] CM Short Term Goal #3 Met Date  01/15/16   THN CM Short Term Goal #4 (0-30 days)  Patient will report increased knowledge and adherence to low salt diet in the next 30 days    THN CM Short Term Goal #4 Start Date  01/08/16   [facility] CM Short Term Goal #4 Met Date  01/22/16   THN CM Short Term Goal #5 (0-30 days)  Patient will receive prior authorization for Eliquis in the next 7 days   THN CM Short Term Goal #5 Start Date  01/08/16   [facility] CM Short Term Goal #5 Met Date  01/15/16     Joylene Draft, RN, [place] Care Management [phone]- Mobile [phone]- [place]

## 2016-01-23 ENCOUNTER — Encounter (HOSPITAL_COMMUNITY): Payer: Self-pay | Admitting: Nurse Practitioner

## 2016-01-23 ENCOUNTER — Other Ambulatory Visit: Payer: Self-pay

## 2016-01-23 ENCOUNTER — Ambulatory Visit (HOSPITAL_COMMUNITY)
Admission: RE | Admit: 2016-01-23 | Discharge: 2016-01-23 | Disposition: A | Discharge status: Home / Self Care | Payer: Medicare Other | Source: Ambulatory Visit | Attending: Nurse Practitioner | Admitting: Nurse Practitioner

## 2016-01-23 VITALS — BP 104/56 | HR 84 | Ht 65.0 in | Wt 216.2 lb

## 2016-01-23 DIAGNOSIS — I110 Hypertensive heart disease with heart failure: Secondary | ICD-10-CM | POA: Diagnosis not present

## 2016-01-23 DIAGNOSIS — I5022 Chronic systolic (congestive) heart failure: Secondary | ICD-10-CM | POA: Insufficient documentation

## 2016-01-23 DIAGNOSIS — K219 Gastro-esophageal reflux disease without esophagitis: Secondary | ICD-10-CM | POA: Insufficient documentation

## 2016-01-23 DIAGNOSIS — Z885 Allergy status to narcotic agent status: Secondary | ICD-10-CM | POA: Diagnosis not present

## 2016-01-23 DIAGNOSIS — Z833 Family history of diabetes mellitus: Secondary | ICD-10-CM | POA: Insufficient documentation

## 2016-01-23 DIAGNOSIS — Z79899 Other long term (current) drug therapy: Secondary | ICD-10-CM | POA: Diagnosis not present

## 2016-01-23 DIAGNOSIS — Z888 Allergy status to other drugs, medicaments and biological substances status: Secondary | ICD-10-CM | POA: Insufficient documentation

## 2016-01-23 DIAGNOSIS — K589 Irritable bowel syndrome without diarrhea: Secondary | ICD-10-CM | POA: Diagnosis not present

## 2016-01-23 DIAGNOSIS — Z923 Personal history of irradiation: Secondary | ICD-10-CM | POA: Diagnosis not present

## 2016-01-23 DIAGNOSIS — Z7901 Long term (current) use of anticoagulants: Secondary | ICD-10-CM | POA: Diagnosis not present

## 2016-01-23 DIAGNOSIS — E785 Hyperlipidemia, unspecified: Secondary | ICD-10-CM | POA: Diagnosis not present

## 2016-01-23 DIAGNOSIS — I4891 Unspecified atrial fibrillation: Secondary | ICD-10-CM | POA: Diagnosis not present

## 2016-01-23 DIAGNOSIS — Z95810 Presence of automatic (implantable) cardiac defibrillator: Secondary | ICD-10-CM | POA: Diagnosis not present

## 2016-01-23 DIAGNOSIS — Z882 Allergy status to sulfonamides status: Secondary | ICD-10-CM | POA: Diagnosis not present

## 2016-01-23 DIAGNOSIS — Z9221 Personal history of antineoplastic chemotherapy: Secondary | ICD-10-CM | POA: Diagnosis not present

## 2016-01-23 DIAGNOSIS — Z853 Personal history of malignant neoplasm of breast: Secondary | ICD-10-CM | POA: Diagnosis not present

## 2016-01-23 DIAGNOSIS — J449 Chronic obstructive pulmonary disease, unspecified: Secondary | ICD-10-CM | POA: Insufficient documentation

## 2016-01-23 DIAGNOSIS — Z801 Family history of malignant neoplasm of trachea, bronchus and lung: Secondary | ICD-10-CM | POA: Insufficient documentation

## 2016-01-23 DIAGNOSIS — Z794 Long term (current) use of insulin: Secondary | ICD-10-CM | POA: Insufficient documentation

## 2016-01-23 DIAGNOSIS — I252 Old myocardial infarction: Secondary | ICD-10-CM | POA: Insufficient documentation

## 2016-01-23 DIAGNOSIS — Z87891 Personal history of nicotine dependence: Secondary | ICD-10-CM | POA: Insufficient documentation

## 2016-01-23 DIAGNOSIS — E119 Type 2 diabetes mellitus without complications: Secondary | ICD-10-CM | POA: Diagnosis not present

## 2016-01-23 DIAGNOSIS — Z8249 Family history of ischemic heart disease and other diseases of the circulatory system: Secondary | ICD-10-CM | POA: Diagnosis not present

## 2016-01-23 DIAGNOSIS — G4733 Obstructive sleep apnea (adult) (pediatric): Secondary | ICD-10-CM | POA: Insufficient documentation

## 2016-01-23 LAB — BASIC METABOLIC PANEL
Anion gap: 8 (ref 5–15)
BUN: 25 mg/dL — ABNORMAL HIGH (ref 6–20)
CALCIUM: 10.1 mg/dL (ref 8.9–10.3)
CO2: 30 mmol/L (ref 22–32)
CREATININE: 1.05 mg/dL — AB (ref 0.44–1.00)
Chloride: 94 mmol/L — ABNORMAL LOW (ref 101–111)
GFR calc non Af Amer: 57 mL/min — ABNORMAL LOW (ref >60)
Glucose, Bld: 226 mg/dL — ABNORMAL HIGH (ref 65–99)
Potassium: 3.1 mmol/L — ABNORMAL LOW (ref 3.5–5.1)
SODIUM: 132 mmol/L — AB (ref 135–145)

## 2016-01-23 NOTE — Telephone Encounter (Signed)
Patient notified per Dr. Haroldine Laws to hold Xarelto 24 hours prior to procedure. Patient verbalized understanding.

## 2016-01-23 NOTE — Telephone Encounter (Signed)
Hold eliquis for 24 hours. Start back ASAP

## 2016-01-23 NOTE — Progress Notes (Signed)
Patient ID: [name], female   DOB: Jul 12, 1956, 60 y.o.   MRN: [id number]     Primary Care Physician: Nicoletta Dress, MD Referring Physician:Dr. Allred Cardiologist: Dr. Lennox Pippins is a 60 y.o. female with a h/o tachy palpitations, with a sense of anxiety.She has previously been on amiodarone for NSVT. She had been hospitalized recently for heart failure but fluid status is now stable. When she was seen by Dr. Rayann Heman 2/17, she was found to be in new onset afib. She was started on xarelto and coreg was increased. Previously on amiodarone for NSVT.  She was referred to the afib clinic for f/u. Ekg today showed SR. She is doing well on DOAC without bleeding issues. She has also been referred to research department for genetic AF and they will enroll pt today. She is also an established pt of the heart failure clinic. She denies alcohol, tobacco, large amounts of caffeine, Has OSA but usesCPAP on a regular basis.   F/i in afib clinic 6/20, pt has had a GI bleed in April, since being seen here last and is now on eliquis. She had the capsule to look for a bleeding source but no active source seen. She is pending an endoscopy at some point for a polyp found in stomach. No further bleeding issues. She was also in [facility] May for HF. Fluid status stable today and pt not aware of any heart irregularities. No further bleeding issues on eliquis.Found not to be a candidate for the genetic af trial.  Today, she denies symptoms of palpitations, chest pain, shortness of breath, orthopnea, PND, lower extremity edema, dizziness, presyncope, syncope, or neurologic sequela. The patient is tolerating medications without difficulties and is otherwise without complaint today.   Past Medical History  Diagnosis Date  . 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 DYSLIPIDEMIA 05/01/2009  . OBESITY 07/24/2007  . OBSTRUCTIVE SLEEP APNEA 07/24/2007    with CPAP compliance  . RESTLESS LEG SYNDROME 07/24/2007  . SYSTOLIC HEART  FAILURE, CHRONIC 01/05/2009    a. chemo induced cardiomyopathy b. cMRI (02/2009) EF 45% c. Myoview 05/2010: EF 59%, no ischemia d. RHC (01/2012) RA 20, RV 55/13/22, PA 56/34 (44), PCWP 28, Fick CO/CI: 3.1 / 1.5, PVR 5.3 WU, PA sat 42% & 48% e. ECHO (10/2012) EF 20-25%, diff HK, MV calcified annulus f. ECHO (03/2014) EF 30-35%, sev HK, inferior/inferoseptal walls, mild MR  . 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 Hypertension   . History of breast cancer 2006    with return in 2008, s/p mastectomy, XRT and chemotherapy  . Osteoarthritis   . Anxiety   . Depression     followed by a psychiatrist  . Asthma   . GERD (gastroesophageal reflux disease)   . Gastroparesis   . IBS (irritable bowel syndrome)   . Myocardial infarction ([place])   . ICD (implantable cardiac defibrillator) in place 11/10/2012    ICD (11/2012)  . COPD (chronic obstructive pulmonary disease) ([place])   . Persistent atrial fibrillation ([place]) 09/2014  . AICD (automatic cardioverter/defibrillator) present   . Presence of permanent cardiac pacemaker   . CHF (congestive heart failure) [facility])    Past Surgical History  Procedure Laterality Date  . Mastectomy      right  . Abdominal hysterectomy  1991  . Cardiac defibrillator placement  11/10/2012    SJM Fortify Assura VR implanted by Dr Rayann Heman for primary prevention  . Cholecystectomy    . Wrist surgery Bilateral   . Knee cartilage surgery    .  Bi-ventricular implantable cardioverter defibrillator N/A 11/10/2012    SJM Forify Assura single chamber ICD  . Cardiac catheterization N/A 08/21/2015    Procedure: Right/Left Heart Cath and Coronary Angiography;  Surgeon: Jolaine Artist, MD;  Location: [place] CV LAB;  Service: Cardiovascular;  Laterality: N/A;  . Esophagogastroduodenoscopy N/A 11/16/2015    Procedure: ESOPHAGOGASTRODUODENOSCOPY (EGD);  Surgeon: Manus Gunning, MD;  Location: [place];  Service: Gastroenterology;  Laterality: N/A;  . Colonoscopy N/A 11/17/2015     Procedure: COLONOSCOPY;  Surgeon: Jerene Bears, MD;  Location: [facility]COPY;  Service: Endoscopy;  Laterality: N/A;  . Givens capsule study N/A 12/11/2015    Procedure: GIVENS CAPSULE STUDY;  Surgeon: Manus Gunning, MD;  Location: [place];  Service: Gastroenterology;  Laterality: N/A;    Current Outpatient Prescriptions  Medication Sig Dispense Refill  . acetaminophen (TYLENOL ARTHRITIS PAIN) 650 MG CR tablet Take 1,300 mg by mouth every 8 (eight) hours as needed. pain    . albuterol (PROVENTIL HFA) 108 (90 BASE) MCG/ACT inhaler Inhale 2 puffs into the lungs every 6 (six) hours as needed. For shortness of breath    . ALPRAZolam (XANAX) 0.5 MG tablet Take 0.5 mg by mouth daily as needed for anxiety.     Marland Kitchen amiodarone (PACERONE) 200 MG tablet Take 1 tablet (200 mg total) by mouth daily. 30 tablet 6  . apixaban (ELIQUIS) 5 MG TABS tablet Take 5 mg by mouth 2 (two) times daily.    Marland Kitchen atorvastatin (LIPITOR) 40 MG tablet Take 40 mg by mouth daily.      . baclofen (LIORESAL) 20 MG tablet Take 20 mg by mouth 2 (two) times daily as needed for muscle spasms.    . busPIRone (BUSPAR) 30 MG tablet Take 30 mg by mouth 2 (two) times daily.     . carvedilol (COREG) 6.25 MG tablet Take 1 tablet (6.25 mg total) by mouth 2 (two) times daily with a meal. 60 tablet 0  . digoxin (LANOXIN) 0.125 MG tablet Take 1 tablet (0.125 mg total) by mouth every other day. 90 tablet 3  . docusate sodium (COLACE) 100 MG capsule Take 3 capsules (300 mg total) by mouth at bedtime. 10 capsule 0  . DULoxetine (CYMBALTA) 30 MG capsule Take 30 mg by mouth daily.    . furosemide (LASIX) 20 MG tablet 40 mg twice a day (Patient taking differently: Take 40 mg by mouth 2 (two) times daily. ) 60 tablet 3  . Glucosamine 500 MG CAPS Take 1 capsule by mouth daily.      Marland Kitchen HUMALOG KWIKPEN 100 UNIT/ML KiwkPen Inject 7-34 Units into the skin 3 (three) times daily. Sliding scale TID    . HUMALOG MIX 75/25 KWIKPEN (75-25) 100 UNIT/ML  Kwikpen Inject 75 Units into the skin 2 (two) times daily.    . hydrOXYzine (ATARAX/VISTARIL) 50 MG tablet Take 50 mg by mouth every 6 (six) hours as needed for itching.    . Insulin Pen Needle (B-D ULTRAFINE III SHORT PEN) 31G X 8 MM MISC 18/day as directed dx 250.00 500 each 11  . ivabradine (CORLANOR) 5 MG TABS tablet Take 1 tablet (5 mg total) by mouth 2 (two) times daily with a meal. 60 tablet 3  . levothyroxine (SYNTHROID, LEVOTHROID) 75 MCG tablet Take 75 mcg by mouth daily before breakfast.     . Melatonin 1 MG TABS Take 0.5 mg by mouth at bedtime.    . metolazone (ZAROXOLYN) 2.5 MG tablet Take 2.5 mg by  mouth as needed (for weight gain >3 lbs).    . mometasone (NASONEX) 50 MCG/ACT nasal spray Place 1 spray into both nostrils daily. allergies    . Multiple Minerals (CALCIUM-MAGNESIUM-ZINC) TABS Take 1 tablet by mouth.    . nitroGLYCERIN (NITROSTAT) 0.4 MG SL tablet Place 1 tablet (0.4 mg total) under the tongue every 5 (five) minutes as needed. For chest pain 25 tablet 3  . pantoprazole (PROTONIX) 40 MG tablet Take 40 mg by mouth 2 (two) times daily.      . polyethylene glycol powder (GLYCOLAX/MIRALAX) powder Mix 17 grams in 8 oz of water twice daily 850 g 3  . potassium chloride SA (K-DUR,KLOR-CON) 20 MEQ tablet Take 3 tablets (60 mEq total) by mouth 2 (two) times daily. Take an additional 40 meq with every dose of metolazone 180 tablet 6  . pyridOXINE (VITAMIN B-6) 100 MG tablet Take 100 mg by mouth daily.    . ramipril (ALTACE) 2.5 MG capsule Take 1 capsule (2.5 mg total) by mouth daily. 30 capsule 0  . rOPINIRole (REQUIP) 1 MG tablet Take 1 mg by mouth at bedtime.      . Simethicone Extra Strength 125 MG CAPS Take by mouth.    . spironolactone (ALDACTONE) 25 MG tablet Take 25 mg by mouth daily.    . SUMAtriptan (IMITREX) 100 MG tablet Take 1 tablet by mouth daily as needed.    . tiotropium (SPIRIVA) 18 MCG inhalation capsule Place 18 mcg into inhaler and inhale daily.      . vitamin  B-12 (CYANOCOBALAMIN) 1000 MCG tablet Take 1,000 mcg by mouth daily.    . vitamin C (ASCORBIC ACID) 500 MG tablet Take 500 mg by mouth daily.     No current facility-administered medications for this encounter.    Allergies  Allergen Reactions  . Bee Venom Anaphylaxis  . Other Other (See Comments)    ANY TYPES OF METAL-CAUSES BLISTERS SKIN AND ITCHING   . Aspirin Hives  . Ciprofloxacin Hives  . Codeine Nausea Only    "can take ONLY if she eats with med"  . Naproxen Hives  . Sulfonamide Derivatives Hives  . Xarelto [Rivaroxaban] Hives  . Nsaids Itching and Rash  . Tape Rash    Also allergic to metal    Social History   Social History  . Marital Status: Divorced    Spouse Name: N/A  . Number of Children: N/A  . Years of Education: N/A   Occupational History  . Disabled    Social History Main Topics  . Smoking status: Former Smoker    Quit date: 04/12/2001  . Smokeless tobacco: Never Used  . Alcohol Use: No  . Drug Use: No  . Sexual Activity: Not Currently   Other Topics Concern  . Not on file   Social History Narrative   Lives with mother and brother [name]          Family History  Problem Relation Age of Onset  . Diabetes Mellitus II Mother   . Coronary artery disease Father     CABG x 3  . Breast cancer Cousin   . Breast cancer Cousin   . Uterine cancer Cousin   . Lung cancer Mother   . Congestive Heart Failure Mother   . Hypertension Father   . Diabetes Mellitus II Father     ROS- All systems are reviewed and negative except as per the HPI above  Physical Exam: Filed Vitals:   01/23/16 1034  BP:  104/56  Pulse: 84  Height: 5\' 5"  (1.651 m)  Weight: 216 lb 3.2 oz (98.068 kg)    GEN- The patient is well appearing, alert and oriented x 3 today.   Head- normocephalic, atraumatic Eyes-  Sclera clear, conjunctiva pink Ears- hearing intact Oropharynx- clear Neck- supple, no JVP Lymph- no cervical lymphadenopathy Lungs- Clear to  ausculation bilaterally, normal work of breathing Heart- Regular rate and rhythm, no murmurs, rubs or gallops, PMI not laterally displaced GI- soft, NT, ND, + BS Extremities- no clubbing, cyanosis, or edema MS- no significant deformity or atrophy Skin- no rash or lesion Psych- euthymic mood, full affect Neuro- strength and sensation are intact  EKG-Sinus Rhythm with first degree AVB, LAD, RBBB, pr int 280 ms, QRS int 138 ms, QTc 493 ms Epic records reviewed  Assessment and Plan: 1. afib Staying in SR Continue amiodarone/ coreg Continue eliquis 5 mg bid Did not qualify for genetic af trial Liver,  tsh checked in May both wnl.  2. Chronic systolic dysfunction/HF Follow up with HF department as scheduled Fluid status stable Continue lasix Daily weights Avoid salt bmet today for Dr. Aundra Dubin, Kt at 3.2 on 6/9  3. ICD Per [place]/Dr. Allred as scheduled   F/u in 6 moths with Dr. Lawrence Marse[place] C. Errin [place], [facility] [address] [place], [place] [address] [phone]

## 2016-01-24 ENCOUNTER — Other Ambulatory Visit: Payer: Self-pay | Admitting: Pharmacist

## 2016-01-24 ENCOUNTER — Telehealth: Payer: Self-pay | Admitting: Internal Medicine

## 2016-01-24 NOTE — Patient Outreach (Signed)
[facility]) Care Management  [place]   01/24/2016  [name] 01-09-1956 [id number]  Subjective: [name] is a 60 y.o. female referred to pharmacy for medication review. Reviewed patient's allergy, medication and problem lists in order to perform this evaluation.  Called to speak with patient regarding her use of hydroxyzine and her Synthroid administration. HIPAA identifiers verified and verbal consent received. [name] reports that she uses hydroxyzine on and off for episodes of itching. Reports that she has not taken it at all in the past week. Reports that when she has itching, uses it only once daily at night before bed. Denies any drying, sedation or dizziness with this medication. Patient denies concern with taking potassium, but reports that she swallows water three times after taking her potassium and fish oil tablets in order to ensure that they go down. Counsel patient to let her provider know if she is to start experiencing dry mouth or having trouble with taking her potassium tablets, as liquid and dissolving options exist. Patient verbalizes understanding.  Discuss past falls with patient. Patient reports that she has had a couple of falls in the past year. Reports no injuries beyond sore back. Reports that these falls were due to her previous living environment. Reports that she had lived in a home with her brother and that he frequently left clutter in her path. States that for this reason, she moved into a camper by herself, which she is able to keep clean and clutter-free. Reports no falls since moved into camper last month. Denies any dizziness/sedation or walking difficulty to contributing to these falls.  Reports currently taking Synthroid around lunch time and multivitamin in morning. Discuss with patient the affect of food and multivitamin on her absorption of Synthroid. Discuss the importance of being consistent with diet and  timing of this medication. Patient verbalizes understanding and states that she will start taking her Synthroid each morning when she wakes up, 30 minutes prior to breakfast, and take her multivitamin with her evening medications.  Patient reports that she has no further questions for me at this time. Provide patient with my phone number for future medication questions/concerns.  Objective:   Encounter Medications: Outpatient Encounter Prescriptions as of 01/24/2016  Medication Sig  . acetaminophen (TYLENOL ARTHRITIS PAIN) 650 MG CR tablet Take 1,300 mg by mouth every 8 (eight) hours as needed. pain  . albuterol (PROVENTIL HFA) 108 (90 BASE) MCG/ACT inhaler Inhale 2 puffs into the lungs every 6 (six) hours as needed. For shortness of breath  . ALPRAZolam (XANAX) 0.5 MG tablet Take 0.5 mg by mouth daily as needed for anxiety.   Marland Kitchen amiodarone (PACERONE) 200 MG tablet Take 1 tablet (200 mg total) by mouth daily.  Marland Kitchen apixaban (ELIQUIS) 5 MG TABS tablet Take 5 mg by mouth 2 (two) times daily.  Marland Kitchen atorvastatin (LIPITOR) 40 MG tablet Take 40 mg by mouth daily.    . baclofen (LIORESAL) 20 MG tablet Take 20 mg by mouth 2 (two) times daily as needed for muscle spasms.  . busPIRone (BUSPAR) 30 MG tablet Take 30 mg by mouth 2 (two) times daily.   . carvedilol (COREG) 6.25 MG tablet Take 1 tablet (6.25 mg total) by mouth 2 (two) times daily with a meal.  . digoxin (LANOXIN) 0.125 MG tablet Take 1 tablet (0.125 mg total) by mouth every other day.  . docusate sodium (COLACE) 100 MG capsule Take 3 capsules (300 mg total) by mouth at  bedtime.  . DULoxetine (CYMBALTA) 30 MG capsule Take 30 mg by mouth daily.  . furosemide (LASIX) 20 MG tablet 40 mg twice a day (Patient taking differently: Take 40 mg by mouth 2 (two) times daily. )  . Glucosamine 500 MG CAPS Take 1 capsule by mouth daily.    Marland Kitchen HUMALOG KWIKPEN 100 UNIT/ML KiwkPen Inject 7-34 Units into the skin 3 (three) times daily. Sliding scale TID  . HUMALOG MIX  75/25 KWIKPEN (75-25) 100 UNIT/ML Kwikpen Inject 75 Units into the skin 2 (two) times daily.  . hydrOXYzine (ATARAX/VISTARIL) 50 MG tablet Take 50 mg by mouth every 6 (six) hours as needed for itching.  . Insulin Pen Needle (B-D ULTRAFINE III SHORT PEN) 31G X 8 MM MISC 18/day as directed dx 250.00  . ivabradine (CORLANOR) 5 MG TABS tablet Take 1 tablet (5 mg total) by mouth 2 (two) times daily with a meal.  . levothyroxine (SYNTHROID, LEVOTHROID) 75 MCG tablet Take 75 mcg by mouth daily before breakfast.   . Melatonin 1 MG TABS Take 0.5 mg by mouth at bedtime.  . metolazone (ZAROXOLYN) 2.5 MG tablet Take 2.5 mg by mouth as needed (for weight gain >3 lbs).  . mometasone (NASONEX) 50 MCG/ACT nasal spray Place 1 spray into both nostrils daily. allergies  . Multiple Minerals (CALCIUM-MAGNESIUM-ZINC) TABS Take 1 tablet by mouth.  . nitroGLYCERIN (NITROSTAT) 0.4 MG SL tablet Place 1 tablet (0.4 mg total) under the tongue every 5 (five) minutes as needed. For chest pain  . pantoprazole (PROTONIX) 40 MG tablet Take 40 mg by mouth 2 (two) times daily.    . polyethylene glycol powder (GLYCOLAX/MIRALAX) powder Mix 17 grams in 8 oz of water twice daily  . potassium chloride SA (K-DUR,KLOR-CON) 20 MEQ tablet Take 3 tablets (60 mEq total) by mouth 2 (two) times daily. Take an additional 40 meq with every dose of metolazone  . pyridOXINE (VITAMIN B-6) 100 MG tablet Take 100 mg by mouth daily.  . ramipril (ALTACE) 2.5 MG capsule Take 1 capsule (2.5 mg total) by mouth daily.  Marland Kitchen rOPINIRole (REQUIP) 1 MG tablet Take 1 mg by mouth at bedtime.    . Simethicone Extra Strength 125 MG CAPS Take by mouth.  . spironolactone (ALDACTONE) 25 MG tablet Take 25 mg by mouth daily.  . SUMAtriptan (IMITREX) 100 MG tablet Take 1 tablet by mouth daily as needed.  . tiotropium (SPIRIVA) 18 MCG inhalation capsule Place 18 mcg into inhaler and inhale daily.    . vitamin B-12 (CYANOCOBALAMIN) 1000 MCG tablet Take 1,000 mcg by mouth  daily.  . vitamin C (ASCORBIC ACID) 500 MG tablet Take 500 mg by mouth daily.   No facility-administered encounter medications on file as of 01/24/2016.    Assessment:   Drugs sorted by system:  Neurologic/Psychologic: alprazolam, buspirone, duloxetine  Cardiovascular: amiodarone, Eliquis, atorvastatin, carvedilol, digoxin, furosemide, Corlanor, metolazone, Nitrostat, ramipril, spironolactone  Pulmonary/Allergy: albuterol, Nasonex, Spiriva  Gastrointestinal: docusate, pantoprazole, Miralax, simethicone  Endocrine: Humalog Kwikpen, Humalog Mix 75/25, levothyroxine  Pain: acetaminophen, baclofen, glucosamine, sumatriptan  Vitamins/Minerals: multivitamin, potassium, Vitamin B6, Vitamin B12, Vitamin C  Miscellaneous: hydroxyzine, melatonin, ropinirole  Duplications in therapy: none noted Gaps in therapy: none noted Drug interactions:  . Amiodarone + Corlanor: Corlanor may enhance the QTc-prolonging effect of Highest Risk QTc-Prolonging Agents. Will contact patient's Cardiologist.  . Potassium + Hydroxyzine (as needed)/Spiriva: Solid oral dosage forms of potassium chloride are contraindicated in patients with impaired gastric emptying (e.g., due to the effects of drugs such  as many anticholinergics). Agents with greater anticholinergic effects are likely of more concern than agents with lesser anticholinergic effects. Patient using hydroxyzine only on as needed basis, not currently using. Denies anticholinergic side effects.  . Amiodarone + albuterol/hydroxyzine: QTc-Prolonging Agents, such as albuterol and hydroxyzine, may enhance the QTc-prolonging effect of Highest Risk QTc-Prolonging Agents, such as amiodarone. Note albuterol and hydroxyzine both used on as needed basis.  . Amiodarone + digoxin: Amiodarone may increase the serum concentration of digoxin. Note per EPIC, digoxin level taken on 12/21/15 shows digoxin level to not be elevated.  Marland Kitchen Spironolactone + potassium: Potassium  Salts may enhance the hyperkalemic effect of Potassium-Sparing Diuretics. Per EPIC, patient's potassium on 01/23/16 was 3.1 mmol/L. Note for adjustment made regarding the lab by Dr. Aundra Dubin, "If she is taking KCl 60 bid, increase to 80 bid".  . Synthroid + multivitamin: Multivitamins/Minerals (with ADEK, Folate, Iron) may decrease the serum concentration of Levothyroxine. Separate the oral administration of iron-containing multivitamins and levothyroxine by at least 4 hours. Patient counseled.  Other issues noted:  Marland Kitchen Patient with atrial fibrillation on Corlanor. Per Corlanor package labeling, Corlanor to be discontinued if atrial fibrillation develops. Per note in EPIC from Dr. Rayann Heman on 09/22/15, patient found to be in atrial fibrillation on 09/22/15. Will contact patient's Cardiologist.   Plan:  1) Will follow up with patient's Cardiologist, Dr. Rayann Heman [phone]), regarding whether patient's Corlanor should be discontinued given patient's new onset atrial fibrillation as of 09/22/15. Will also note potential increased QTc-prolongation risk when used with amiodarone. Will further confirm that patient has been contacted regarding her potassium level and dosing.  2) Will plan to close pharmacy episode following response from patient's Cardiologist.  Harlow Asa, [place] [facility] [phone]

## 2016-01-24 NOTE — Patient Outreach (Addendum)
Called the office of patient's Cardiologist, Dr. Rayann Heman [phone]), regarding whether patient's Corlanor should be discontinued given patient's new onset atrial fibrillation as of 09/22/15. Left a message with Butch Penny. Also noted potential increased QTc-prolongation risk when used with amiodarone. Further requested confirmation that patient has been contacted regarding her potassium level and dosing adjustment per Dr. Claris Gladden notation on her basic metabolic panel from [id number] that given the level, "If she is taking KCl 60 bid, increase to 80 bid."  If have not heard back from [facility] by 01/26/16, will follow up again at that time.  Harlow Asa, PharmD Clinical Pharmacist [place] [facility] [phone]

## 2016-01-24 NOTE — Telephone Encounter (Signed)
corlanor monitored by heart failure team. Will forward to CHF pharmacist.

## 2016-01-24 NOTE — Telephone Encounter (Signed)
Patient of SA called with [facility] after passing a painful large hard BM. On Xarelto. Recent hospitalization and workup for iron deficiency anemia and heme + stool (colon,EGD,CE reviewed). To have enteroscopy for SB polyps and ?AVM. This is different. I told her that she has a fissure, to pack painful bleeding area with tissue as needed, to increase miralax to keep her stools soft. If she has overwhelming bleeding (which she does not at this time) to go to ER where she would likely be admitted and have her blood thinner held (she has multiple advanced co morbidities).

## 2016-01-24 NOTE — Telephone Encounter (Signed)
EKG from 01/23/16 SR and QTc <500 msec with HR still in the 80s. Will continue to monitor HR closely for significant drops that can put the patient at risk for QTc prolongation with Corlanor in combination with amiodarone.   Ruta Hinds. Velva Harman, PharmD, BCPS, CPP Clinical Pharmacist Pager: [phone] Phone: [phone] 01/24/2016 2:11 PM

## 2016-01-24 NOTE — Addendum Note (Signed)
Addended by: Harlow Asa A on: 01/24/2016 12:29 PM   Modules accepted: Medications

## 2016-01-24 NOTE — Telephone Encounter (Signed)
New message      Pharmacist at [facility] noticed that pt is still on corlanor 5mg  since feb AFIB diagnosis.  Possible drug interaction with this medication and amiodarone.  Please call

## 2016-01-25 NOTE — Telephone Encounter (Signed)
Patient called and left a message that she is doing better. States the bleeding did not last long . Reports no bleeding at this time only brown stool.

## 2016-01-25 NOTE — Telephone Encounter (Signed)
Left a message for patient to call back with update.

## 2016-01-25 NOTE — Telephone Encounter (Signed)
[name], please see note from Dr. Henrene Pastor as outlined. Can you call her to see how she is doing? Thanks

## 2016-01-25 NOTE — Telephone Encounter (Signed)
Thanks for the followup!  

## 2016-01-26 ENCOUNTER — Other Ambulatory Visit: Payer: Self-pay | Admitting: Pharmacist

## 2016-01-26 NOTE — Patient Outreach (Addendum)
Called [facility] on 01/24/16, regarding whether patient's Corlanor should be discontinued given patient's atrial fibrillation and potential increased QTc-prolongation risk when used with amiodarone, per EPIC Chart Review, this message was forwarded to [place]. Velva Harman, PharmD with heart failure clinic. Note per 01/24/16 note from [facility], "EKG from 01/23/16 SR and QTc <500 msec with HR still in the 80s. Will continue to monitor HR closely for significant drops that can put the patient at risk for QTc prolongation with Corlanor in combination with amiodarone."   Regarding the patient's potassium level and dosing, call to follow up with Ms. Mcirvin that she received this information from the clinic. Ms Longfellow states that she has not heard back from [facility] regarding this level as of yet, but will call herself to follow up today.  Patient reports that she has no further medication questions/concerns for me at this time. Confirm that patient has my phone number. Will close pharmacy episode at this time.  Harlow Asa, PharmD Clinical Pharmacist [place] [facility] [phone]

## 2016-01-29 ENCOUNTER — Other Ambulatory Visit: Payer: Self-pay | Admitting: *Deleted

## 2016-01-29 NOTE — Patient Outreach (Signed)
[facility]) Care Management  01/29/2016  CHRISA HASSAN 1956/06/13 [id number]   Transition of care call  RN placed call spoke with Mrs.Sinnett, patient states that she has felt better and had more energy today than the last week. Mrs.Polzin denies shortness of breath , increased swelling report her weight today is 210, without increases greater than 3 pounds in a day or 5 in a week admits to being in the green zone.   Patient discussed large  bowel movement that she had last week and noted some bleeding afterwards she did notify her GI doctor, she has not noted any further bleeding and has increased her use of miralax.   Mrs.Sobecki discussed that she missed her appointment with PCP on last week due to car trouble but plans to call in am to reschedule.  Mrs.Amason discussed her blood sugar range in the last few days have been  109 and 180 some better,  this am reading 124, no hypoglycemic episodes, she continues to monitor her carbohydrate and salt content in her diet per report.  Patient has completed transition of care, she has not experienced an hospital readmission in the last 31 days. Patient continues to monitor and record, weights, blood sugars and blood pressures on a daily basis. Discussed health coach program, patient agreeable to home visit in next month then transition to health coach for continued education and management.    Plan Will plan home visit in the next month. Review patient centered goals and update Patient will continue to monitor and record weights on a daily basis and notify MD for worsening heart failure symptoms, and any bleeding.   [facility] CM Care Plan Problem One        Most Recent Value   Care Plan Problem One  Recent hospital admission   Role Documenting the Problem One  Care Management [place] for Problem One  Active   [facility] Long Term Goal (31-90 days)  Patient will not experience a hospital readmission in the  next 31 days   [facility] Long Term Goal Start Date  12/26/15   [facility] Long Term Goal Met Date  01/29/16   [facility] CM Short Term Goal #1 (0-30 days)  Patient will attend post hospital MD visit in the next 14 days   [facility] CM Short Term Goal #1 Start Date  12/26/15   [facility] CM Short Term Goal #1 Met Date  01/02/16   [facility] CM Short Term Goal #2 (0-30 days)  Patient will be able to state symptoms of heart failure and action plan to take in the next 30 days   [facility] CM Short Term Goal #2 Start Date  12/26/15   [facility] CM Short Term Goal #2 Met Date  01/22/16   Interventions for Short Term Goal #2  Reinforced heart failure zones   [facility] CM Short Term Goal #3 (0-30 days)  Patient will continue to weigh and record daily in the next 30 days   [facility] CM Short Term Goal #3 Start Date  12/26/15   [facility] CM Short Term Goal #3 Met Date  01/15/16   [facility] CM Short Term Goal #4 (0-30 days)  Patient will report increased knowledge and adherence to low salt diet in the next 30 days    [facility] CM Short Term Goal #4 Start Date  01/08/16   [facility] CM Short Term Goal #4 Met Date  01/22/16   [facility] CM Short Term Goal #5 (0-30 days)  Patient will  receive prior authorization for Eliquis in the next 7 days   [facility] CM Short Term Goal #5 Start Date  01/08/16   [facility] CM Short Term Goal #5 Met Date  01/15/16     Joylene Draft, RN, [place] Care Management [phone]- Mobile [phone]- [place]

## 2016-02-01 ENCOUNTER — Ambulatory Visit (HOSPITAL_COMMUNITY)
Admission: RE | Admit: 2016-02-01 | Discharge: 2016-02-01 | Disposition: A | Discharge status: Home / Self Care | Payer: Medicare Other | Source: Ambulatory Visit | Attending: Internal Medicine | Admitting: Internal Medicine

## 2016-02-01 ENCOUNTER — Encounter: Payer: Self-pay | Admitting: Internal Medicine

## 2016-02-01 ENCOUNTER — Ambulatory Visit (INDEPENDENT_AMBULATORY_CARE_PROVIDER_SITE_OTHER): Payer: Medicare Other | Admitting: *Deleted

## 2016-02-01 ENCOUNTER — Other Ambulatory Visit (HOSPITAL_COMMUNITY): Payer: Self-pay | Admitting: *Deleted

## 2016-02-01 ENCOUNTER — Telehealth (HOSPITAL_COMMUNITY): Payer: Self-pay | Admitting: Cardiology

## 2016-02-01 ENCOUNTER — Encounter (HOSPITAL_COMMUNITY): Payer: Self-pay

## 2016-02-01 VITALS — BP 120/54 | HR 88 | Wt 216.2 lb

## 2016-02-01 DIAGNOSIS — Z794 Long term (current) use of insulin: Secondary | ICD-10-CM | POA: Diagnosis not present

## 2016-02-01 DIAGNOSIS — I2510 Atherosclerotic heart disease of native coronary artery without angina pectoris: Secondary | ICD-10-CM | POA: Diagnosis not present

## 2016-02-01 DIAGNOSIS — E669 Obesity, unspecified: Secondary | ICD-10-CM | POA: Diagnosis not present

## 2016-02-01 DIAGNOSIS — Z95810 Presence of automatic (implantable) cardiac defibrillator: Secondary | ICD-10-CM | POA: Insufficient documentation

## 2016-02-01 DIAGNOSIS — F329 Major depressive disorder, single episode, unspecified: Secondary | ICD-10-CM | POA: Insufficient documentation

## 2016-02-01 DIAGNOSIS — I110 Hypertensive heart disease with heart failure: Secondary | ICD-10-CM | POA: Diagnosis not present

## 2016-02-01 DIAGNOSIS — Z853 Personal history of malignant neoplasm of breast: Secondary | ICD-10-CM | POA: Diagnosis not present

## 2016-02-01 DIAGNOSIS — I472 Ventricular tachycardia: Secondary | ICD-10-CM | POA: Insufficient documentation

## 2016-02-01 DIAGNOSIS — I4729 Other ventricular tachycardia: Secondary | ICD-10-CM

## 2016-02-01 DIAGNOSIS — F419 Anxiety disorder, unspecified: Secondary | ICD-10-CM | POA: Insufficient documentation

## 2016-02-01 DIAGNOSIS — G4733 Obstructive sleep apnea (adult) (pediatric): Secondary | ICD-10-CM | POA: Diagnosis not present

## 2016-02-01 DIAGNOSIS — I427 Cardiomyopathy due to drug and external agent: Secondary | ICD-10-CM | POA: Diagnosis not present

## 2016-02-01 DIAGNOSIS — K219 Gastro-esophageal reflux disease without esophagitis: Secondary | ICD-10-CM | POA: Diagnosis not present

## 2016-02-01 DIAGNOSIS — Z87891 Personal history of nicotine dependence: Secondary | ICD-10-CM | POA: Insufficient documentation

## 2016-02-01 DIAGNOSIS — K3184 Gastroparesis: Secondary | ICD-10-CM | POA: Insufficient documentation

## 2016-02-01 DIAGNOSIS — I5022 Chronic systolic (congestive) heart failure: Secondary | ICD-10-CM | POA: Diagnosis not present

## 2016-02-01 DIAGNOSIS — M1990 Unspecified osteoarthritis, unspecified site: Secondary | ICD-10-CM | POA: Diagnosis not present

## 2016-02-01 DIAGNOSIS — J449 Chronic obstructive pulmonary disease, unspecified: Secondary | ICD-10-CM | POA: Diagnosis not present

## 2016-02-01 DIAGNOSIS — Z950 Presence of cardiac pacemaker: Secondary | ICD-10-CM | POA: Diagnosis not present

## 2016-02-01 DIAGNOSIS — G2581 Restless legs syndrome: Secondary | ICD-10-CM | POA: Diagnosis not present

## 2016-02-01 DIAGNOSIS — E1143 Type 2 diabetes mellitus with diabetic autonomic (poly)neuropathy: Secondary | ICD-10-CM | POA: Diagnosis not present

## 2016-02-01 DIAGNOSIS — I10 Essential (primary) hypertension: Secondary | ICD-10-CM | POA: Diagnosis not present

## 2016-02-01 DIAGNOSIS — E785 Hyperlipidemia, unspecified: Secondary | ICD-10-CM | POA: Insufficient documentation

## 2016-02-01 DIAGNOSIS — K589 Irritable bowel syndrome without diarrhea: Secondary | ICD-10-CM | POA: Diagnosis not present

## 2016-02-01 DIAGNOSIS — I480 Paroxysmal atrial fibrillation: Secondary | ICD-10-CM | POA: Insufficient documentation

## 2016-02-01 DIAGNOSIS — E039 Hypothyroidism, unspecified: Secondary | ICD-10-CM | POA: Insufficient documentation

## 2016-02-01 DIAGNOSIS — I509 Heart failure, unspecified: Secondary | ICD-10-CM

## 2016-02-01 LAB — BASIC METABOLIC PANEL
Anion gap: 9 (ref 5–15)
BUN: 21 mg/dL — AB (ref 6–20)
CALCIUM: 9.9 mg/dL (ref 8.9–10.3)
CO2: 31 mmol/L (ref 22–32)
CREATININE: 1.05 mg/dL — AB (ref 0.44–1.00)
Chloride: 94 mmol/L — ABNORMAL LOW (ref 101–111)
GFR calc non Af Amer: 57 mL/min — ABNORMAL LOW (ref >60)
Glucose, Bld: 116 mg/dL — ABNORMAL HIGH (ref 65–99)
Potassium: 2.9 mmol/L — ABNORMAL LOW (ref 3.5–5.1)
SODIUM: 134 mmol/L — AB (ref 135–145)

## 2016-02-01 MED ORDER — POTASSIUM CHLORIDE CRYS ER 20 MEQ PO TBCR: 60.0000 meq | EXTENDED_RELEASE_TABLET | Freq: Three times a day (TID) | ORAL | Status: DC

## 2016-02-01 MED ORDER — FUROSEMIDE 20 MG PO TABS: 60.0000 mg | ORAL_TABLET | Freq: Two times a day (BID) | ORAL | Status: DC

## 2016-02-01 MED ORDER — RAMIPRIL 2.5 MG PO CAPS: 2.5000 mg | ORAL_CAPSULE | Freq: Every day | ORAL | Status: DC

## 2016-02-01 NOTE — Patient Instructions (Signed)
Labs today  INCREASE Lasix to 60 mg (3 tabs) twice a day  Your physician recommends that you schedule a follow-up appointment in: 6 weeks In the [place] following things EVERYDAY: 1) Weigh yourself in the morning before breakfast. Write it down and keep it in a log. 2) Take your medicines as prescribed 3) Eat low salt foods-Limit salt (sodium) to 2000 mg per day.  4) Stay as active as you can everyday 5) Limit all fluids for the day to less than 2 liters 6)

## 2016-02-01 NOTE — Addendum Note (Signed)
Encounter addended by: Kerry Dory, CMA on: 02/01/2016  2:54 PM<BR>     Documentation filed: Orders

## 2016-02-01 NOTE — Progress Notes (Signed)
Patient ID: [name], female   DOB: Sep 22, 1955, 60 y.o.   MRN: [id number]    Advanced Heart Failure Clinic Note   Endocrinologist: Dr Loanne Drilling  Oncologist: Dr. Humphrey Rolls PCP: Joanne Peace, NP ([place]) Primary HF MD: Dr Haroldine Laws  HPI [name] is a 59  year old female with history of breast cancer, obesity, HTN, DM, IBS, HLD, OSA, chemo induced cardiomyopathy s/p ICD and chronic systolic HF.    She had chemo induced cardiomyopathy.  Her EF has fallen from 45% per Cardiac MRI in July 2010 to 20-25% in 10/2012. S/P single chamber ICD (St Jude) implantation on 11/10/12 by Dr. Rayann Heman.    Admitted 08/19/15 with chest pain. Had [facility] with mild CAD and increased filling pressures. CPX placed on hold due to breast mass. She was told to have breast biopsy ASAP with her history of breast cancer x 2. Discharged on lasix 40 mg po twice daily, carvedilol 6.25 mg twice a day, ramipril 5 mg at bed time, dig 0.125 mg daily and ivabradine 5 mg twice a day. Discharge weight was 220 pounds.    January 2017 Had breast biopsy with negative results.   Admitted 12/21/15 with increased dyspnea and chest pain. Additionally, a new breast lump was discovered, with outpatient follow up arranged. She diuresed 15 lbs. Discharge weight 213 lbs.  She returns today for regular follow up. At last visit encouraged to take dose of metolazone that evening as she was slightly overloaded.  Weight stable from last visit. Has been feeling OK overall.  Had one episode of a very hard stool about 1 week ago, with associated BRBPR (has hx of hemorrhoids).  Has had no bleeding since. Breathing has been good "for the most part". Has taken metolazone took metolazone yesterday as well as 01/31/16. She keeps detailed notes of her intake of fluid and sodium.  Using CPAP every night. Taking all medications. Has mammogram for new breast lump on 02/27/16.  Corevue - Thoracic impedence has been down over the past 2 weeks, but  starting to trend towards dry. No AT/AF/VT/VF alert.   CPX 2017  Peak VO2: 12.7 (73% predicted peak VO2) VE/VCO2 slope: 32.3 OUES: 1.62 Peak RER: 1.08 Exercise testing with gas exchange demonstrates a mild to moderate functional impairment when compared to matched sedentary norms. At peak exercise, patient appears to have mild circulatory limitation, Her body habitus and related restrictive lung physiology are also likely contributing to her exercise intolerance. Chronotropic incompetence was also present.   RHC/LCH  08/20/2014   Mid Cx lesion, 30% stenosed.  3rd Mrg lesion, 30% stenosed. Findings: RA = 12 RV = 55/6/12 PA = 54/21 (34) PCW = 22 Ao 93/58 (71) LV 106/8/26 Fick cardiac output/index = 4.2/2.1 PVR = 2.8 SVR = 1115 FA sat = 93% PA sat = 54%, 53% Assessment: 1. Very mild CAD 2. Mild to moderately increased filling pressures 3. Severe LV dysfunction due to NICM EF 20%medications.   [place] on 01/30/12 showed decompensated HF with low cardiac output consistent with cardiogenic shock.  [place] 6/27  RA = 20  RV = 55/13/22  PA =56/34 (44)  PCW = 28  Fick cardiac output/index = 3.1/1.5  PVR = 5.3 [place]  FA sat = 91%  PA sat = 42%, 48%  SVC sat = 46%  Echo 08/12/11: Left ventricle: mildly dilated, EF 20% to 25%. Diffuse hypokinesis. Doppler parameters are consistent with restrictive physiology, indicative of decreased left ventricular diastolic compliance and/or increased left  atrial pressure. Left atrium was moderately dilated. Right atrium mildly dilated. 04/09/12 ECHO EF 30-35% LV mildly dilated  Hyopkinesis 10/07/12 ECHO EF 20-25% RV ok. 03/31/14: ECHO EF 30-35%, severe HK inferior/inferoseptal walls, mild MR, LA mod dilated, RA mildly dilated 06/28/2015: ECHO EF 25% RV mildly dilated. Severe HK inferior wall.   Myoview in 05/2010 EF 59%. No ischemia.    ROS: All systems negative except as listed in HPI, PMH and Problem List.  Social History   Social History  .  Marital Status: Divorced    Spouse Name: N/A  . Number of Children: N/A  . Years of Education: N/A   Occupational History  . Disabled    Social History Main Topics  . Smoking status: Former Smoker    Quit date: 04/12/2001  . Smokeless tobacco: Never Used  . Alcohol Use: No  . Drug Use: No  . Sexual Activity: Not Currently   Other Topics Concern  . Not on file   Social History Narrative   Lives with mother and brother [name]         Family History  Problem Relation Age of Onset  . Diabetes Mellitus II Mother   . Coronary artery disease Father     CABG x 3  . Breast cancer Cousin   . Breast cancer Cousin   . Uterine cancer Cousin   . Lung cancer Mother   . Congestive Heart Failure Mother   . Hypertension Father   . Diabetes Mellitus II Father     Past Medical History  Diagnosis Date  . 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 DYSLIPIDEMIA 05/01/2009  . OBESITY 07/24/2007  . OBSTRUCTIVE SLEEP APNEA 07/24/2007    with CPAP compliance  . RESTLESS LEG SYNDROME 07/24/2007  . SYSTOLIC HEART FAILURE, CHRONIC 01/05/2009    a. chemo induced cardiomyopathy b. cMRI (02/2009) EF 45% c. Myoview 05/2010: EF 59%, no ischemia d. RHC (01/2012) RA 20, RV 55/13/22, PA 56/34 (44), PCWP 28, Fick CO/CI: 3.1 / 1.5, PVR 5.3 WU, PA sat 42% & 48% e. ECHO (10/2012) EF 20-25%, diff HK, MV calcified annulus f. ECHO (03/2014) EF 30-35%, sev HK, inferior/inferoseptal walls, mild MR  . 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 Hypertension   . History of breast cancer 2006    with return in 2008, s/p mastectomy, XRT and chemotherapy  . Osteoarthritis   . Anxiety   . Depression     followed by a psychiatrist  . Asthma   . GERD (gastroesophageal reflux disease)   . Gastroparesis   . IBS (irritable bowel syndrome)   . Myocardial infarction ([place])   . ICD (implantable cardiac defibrillator) in place 11/10/2012    ICD (11/2012)  . COPD (chronic obstructive pulmonary disease) ([place])   . Persistent atrial fibrillation ([place])  09/2014  . AICD (automatic cardioverter/defibrillator) present   . Presence of permanent cardiac pacemaker   . CHF (congestive heart failure) ([place])     Current Outpatient Prescriptions  Medication Sig Dispense Refill  . acetaminophen (TYLENOL ARTHRITIS PAIN) 650 MG CR tablet Take 1,300 mg by mouth every 8 (eight) hours as needed. pain    . albuterol (PROVENTIL HFA) 108 (90 BASE) MCG/ACT inhaler Inhale 2 puffs into the lungs every 6 (six) hours as needed. For shortness of breath    . ALPRAZolam (XANAX) 0.5 MG tablet Take 0.5 mg by mouth daily as needed for anxiety.     Marland Kitchen amiodarone (PACERONE) 200 MG tablet Take 1 tablet (200 mg total) by mouth daily. 30 tablet 6  .  apixaban (ELIQUIS) 5 MG TABS tablet Take 5 mg by mouth 2 (two) times daily.    Marland Kitchen atorvastatin (LIPITOR) 40 MG tablet Take 40 mg by mouth daily.      . baclofen (LIORESAL) 20 MG tablet Take 20 mg by mouth 2 (two) times daily as needed for muscle spasms.    . busPIRone (BUSPAR) 30 MG tablet Take 30 mg by mouth 2 (two) times daily.     . carvedilol (COREG) 6.25 MG tablet Take 1 tablet (6.25 mg total) by mouth 2 (two) times daily with a meal. 60 tablet 0  . digoxin (LANOXIN) 0.125 MG tablet Take 1 tablet (0.125 mg total) by mouth every other day. 90 tablet 3  . docusate sodium (COLACE) 100 MG capsule Take 3 capsules (300 mg total) by mouth at bedtime. 10 capsule 0  . DULoxetine (CYMBALTA) 30 MG capsule Take 30 mg by mouth daily.    . furosemide (LASIX) 20 MG tablet 40 mg twice a day (Patient taking differently: Take 40 mg by mouth 2 (two) times daily. ) 60 tablet 3  . Glucosamine 500 MG CAPS Take 1 capsule by mouth daily.      Marland Kitchen HUMALOG KWIKPEN 100 UNIT/ML KiwkPen Inject 7-34 Units into the skin 3 (three) times daily. Sliding scale TID    . HUMALOG MIX 75/25 KWIKPEN (75-25) 100 UNIT/ML Kwikpen Inject 75 Units into the skin 2 (two) times daily.    . hydrOXYzine (ATARAX/VISTARIL) 50 MG tablet Take 50 mg by mouth every 6 (six) hours as  needed for itching.    . Insulin Pen Needle (B-D ULTRAFINE III SHORT PEN) 31G X 8 MM MISC 18/day as directed dx 250.00 500 each 11  . ivabradine (CORLANOR) 5 MG TABS tablet Take 1 tablet (5 mg total) by mouth 2 (two) times daily with a meal. 60 tablet 3  . levothyroxine (SYNTHROID, LEVOTHROID) 75 MCG tablet Take 75 mcg by mouth daily before breakfast.     . Melatonin 1 MG TABS Take 0.5 mg by mouth at bedtime.    . metolazone (ZAROXOLYN) 2.5 MG tablet Take 2.5 mg by mouth as needed (for weight gain >3 lbs).    . mometasone (NASONEX) 50 MCG/ACT nasal spray Place 1 spray into both nostrils daily. allergies    . Multiple Minerals (CALCIUM-MAGNESIUM-ZINC) TABS Take 1 tablet by mouth.    . nitroGLYCERIN (NITROSTAT) 0.4 MG SL tablet Place 1 tablet (0.4 mg total) under the tongue every 5 (five) minutes as needed. For chest pain 25 tablet 3  . pantoprazole (PROTONIX) 40 MG tablet Take 40 mg by mouth 2 (two) times daily.      . polyethylene glycol powder (GLYCOLAX/MIRALAX) powder Mix 17 grams in 8 oz of water twice daily 850 g 3  . potassium chloride SA (K-DUR,KLOR-CON) 20 MEQ tablet Take 80 mEq by mouth 2 (two) times daily. With an additional 40 meq on Metolazone    . pyridOXINE (VITAMIN B-6) 100 MG tablet Take 100 mg by mouth daily.    . ramipril (ALTACE) 2.5 MG capsule Take 1 capsule (2.5 mg total) by mouth daily. 30 capsule 0  . rOPINIRole (REQUIP) 1 MG tablet Take 1 mg by mouth at bedtime.      . Simethicone Extra Strength 125 MG CAPS Take by mouth.    . spironolactone (ALDACTONE) 25 MG tablet Take 25 mg by mouth daily.    . SUMAtriptan (IMITREX) 100 MG tablet Take 1 tablet by mouth daily as needed.    Marland Kitchen  tiotropium (SPIRIVA) 18 MCG inhalation capsule Place 18 mcg into inhaler and inhale daily.      . vitamin B-12 (CYANOCOBALAMIN) 1000 MCG tablet Take 1,000 mcg by mouth daily.    . vitamin C (ASCORBIC ACID) 500 MG tablet Take 500 mg by mouth daily.     No current facility-administered medications for  this encounter.   Filed Vitals:   02/01/16 1034  BP: 120/54  Pulse: 88  Weight: 216 lb 3.2 oz (98.068 kg)  SpO2: 96%   Wt Readings from Last 3 Encounters:  02/01/16 216 lb 3.2 oz (98.068 kg)  01/29/16 210 lb (95.255 kg)  01/23/16 216 lb 3.2 oz (98.068 kg)     PHYSICAL EXAM: General:  Obese, Well appearing. Ambulated in the clinic without difficulty   HEENT: normal  Neck: supple. JVP 8-9 cm carotids 2+ bilaterally; no bruits. No thyromegaly or nodule noted.  Cor: PMI normal. Irregularly irregular. no S3. Lungs: CTAB, normal effort Abdomen: soft, obese, NT, ND, no HSM. No bruits or masses. +BS  Extremities: no cyanosis, clubbing, rash, Trace edema 1/2 way to calf.  Neuro: alert & orientedx3, cranial nerves grossly intact. Moves all 4 extremities w/o difficulty. Affect pleasant.  ASSESSMENT & PLAN:  1) Chronic systolic HF, NICM thought to be chemo induced, s/p ST Jude  ICD. Most Recent ECHO 06/28/2015  EF ~20%. RV mildly dilated.  - Has NYHA II-III symptoms.   - Will increase lasix to 60 mg BID. Continue spiro 25 mg daily. - Continue metolazone 2.5 mg as needed ( should take 40 meq of potassium on these days).  - Continue coreg 6.25 mg BID  - Continue ramipril 5 mg qhs.   -Continue digoxin 0.125 mg daily . Dig level 0.2 on 08/2015  - Continue corlanor 5 mg twice a day.   - Check BMET and Mag today.   - CPX result with mild -mod circulatory component. Plan to repeat 6 month- 1 year from 09/2014.  - Reinforced the need and importance of daily weights, a low sodium diet, and fluid restriction (less than 2 L a day). Instructed to call the HF clinic if weight increases more than 3 lbs overnight or 5 lbs in a week.  2) HTN - Stable. Continue current medications as above.  3) OSA - Compliant with CPAP nightly. 4) HLD - Followed by PCP.  5) Obesity - taking detailed notes of her diet. Encouraged to continue and try and lose weight.  6) CAD - mild nonobstructive CAD on 2017 cath.  Will continue statin, BB and ACE-I. No s/s of ischemia. 7) NSVT- Continue  amio to 200 mg daily. Needs yearly eye exam.  TSH January 2017 ok.  8) Breast Mass - Negative breast biopsy January 2017.    - Now has new breast mass. Awaiting work up.  9) Paroxysmal Afib - in NSR by exam. No AF on corevue.  - Continue Eliquis 5 mg BID. Alert PCP if she has any further BRBPR (has GI follow up soon as well)  Med changes as above. BMET/BNP today. Follow up 6 weeks.    Satira Mccallum Priscilla Kirstein PA-C 10:54 AM

## 2016-02-01 NOTE — Telephone Encounter (Signed)
Notes Recorded by Kerry Dory, CMA on 02/01/2016 at 3:49 PM [phone] [facility]) patient aware and voiced understanding. Repeat labs 7/7

## 2016-02-01 NOTE — Telephone Encounter (Signed)
-----   Message from Shirley Friar, PA-C sent at 02/01/2016 12:36 PM EDT ----- K low.   Increase K to 60 meq TID.    Needs recheck next week (July 7).   She had also only been taking 20 meq on metolazone. Changed this to 40 meq at visit today.    Legrand Como [address] [place], [place] 02/01/2016 12:32 PM

## 2016-02-02 NOTE — Progress Notes (Signed)
Remote ICD transmission.   

## 2016-02-07 LAB — CUP PACEART REMOTE DEVICE CHECK
Battery Remaining Longevity: 74 mo
Battery Remaining Percentage: 70 %
HIGH POWER IMPEDANCE MEASURED VALUE: 74 Ohm
Lead Channel Impedance Value: 410 Ohm
Lead Channel Setting Pacing Amplitude: 2.5 V
Lead Channel Setting Pacing Pulse Width: 0.5 ms
Lead Channel Setting Sensing Sensitivity: 0.5 mV
MDC IDC LEAD IMPLANT DT: 20140408
MDC IDC LEAD LOCATION: 753860
MDC IDC PG SERIAL: 7066421
MDC IDC SESS DTM: 20170705150408
MDC IDC STAT BRADY RV PERCENT PACED: 1 % — AB

## 2016-02-09 ENCOUNTER — Encounter: Payer: Self-pay | Admitting: Cardiology

## 2016-02-09 ENCOUNTER — Ambulatory Visit (HOSPITAL_COMMUNITY)
Admission: RE | Admit: 2016-02-09 | Discharge: 2016-02-09 | Disposition: A | Discharge status: Home / Self Care | Payer: Medicare Other | Source: Ambulatory Visit | Attending: Internal Medicine | Admitting: Internal Medicine

## 2016-02-09 DIAGNOSIS — I509 Heart failure, unspecified: Secondary | ICD-10-CM | POA: Diagnosis not present

## 2016-02-09 DIAGNOSIS — I5023 Acute on chronic systolic (congestive) heart failure: Secondary | ICD-10-CM

## 2016-02-09 LAB — BASIC METABOLIC PANEL
ANION GAP: 9 (ref 5–15)
BUN: 20 mg/dL (ref 6–20)
CHLORIDE: 97 mmol/L — AB (ref 101–111)
CO2: 32 mmol/L (ref 22–32)
Calcium: 10.2 mg/dL (ref 8.9–10.3)
Creatinine, Ser: 1.04 mg/dL — ABNORMAL HIGH (ref 0.44–1.00)
GFR calc Af Amer: >60 mL/min (ref >60)
GFR, EST NON AFRICAN AMERICAN: 57 mL/min — AB (ref >60)
GLUCOSE: 74 mg/dL (ref 65–99)
POTASSIUM: 3.2 mmol/L — AB (ref 3.5–5.1)
Sodium: 138 mmol/L (ref 135–145)

## 2016-02-09 MED ORDER — APIXABAN 5 MG PO TABS: 5.0000 mg | ORAL_TABLET | Freq: Two times a day (BID) | ORAL | Status: DC

## 2016-02-09 MED ORDER — IVABRADINE HCL 5 MG PO TABS
5.0000 mg | ORAL_TABLET | Freq: Two times a day (BID) | ORAL | Status: DC
Start: 2016-02-09 — End: 2016-05-31

## 2016-02-09 NOTE — Addendum Note (Signed)
Encounter addended by: Harvie Junior, CMA on: 02/09/2016 10:46 AM<BR>     Documentation filed: [employer], Orders

## 2016-02-13 ENCOUNTER — Telehealth (HOSPITAL_COMMUNITY): Payer: Self-pay

## 2016-02-13 NOTE — Telephone Encounter (Signed)
Patient calling CHF clinic triage to speak to someone concerning side effects of medication. Thinks Lipitor is causing some "weird symptoms". Will forward to erika to see if her signs/symptoms are caused by long term use of Lipitor.  Renee Pain

## 2016-02-15 ENCOUNTER — Other Ambulatory Visit: Payer: Self-pay | Admitting: *Deleted

## 2016-02-15 VITALS — BP 100/60 | HR 87 | Resp 20 | Wt 211.6 lb

## 2016-02-15 DIAGNOSIS — I5022 Chronic systolic (congestive) heart failure: Secondary | ICD-10-CM

## 2016-02-15 DIAGNOSIS — Z95810 Presence of automatic (implantable) cardiac defibrillator: Secondary | ICD-10-CM

## 2016-02-15 DIAGNOSIS — E118 Type 2 diabetes mellitus with unspecified complications: Secondary | ICD-10-CM

## 2016-02-15 DIAGNOSIS — Z794 Long term (current) use of insulin: Secondary | ICD-10-CM

## 2016-02-15 DIAGNOSIS — G4733 Obstructive sleep apnea (adult) (pediatric): Secondary | ICD-10-CM

## 2016-02-15 DIAGNOSIS — I4891 Unspecified atrial fibrillation: Secondary | ICD-10-CM

## 2016-02-15 NOTE — Patient Outreach (Signed)
[facility]) Care Management   02/15/2016  [name] Jun 24, 1956 [id number]  [name] is an 60 y.o. female  Subjective:  Patient reports that she is doing pretty good, except for being tired related to not sleeping at night due to itching, no rash just itching. Patient discussed that she has mentioned it to [place] and she has an appointment with her PCP to discuss on next week. [name] discussed that she still has some difficulty with constipation, even with taking miralax twice daily, she took , magnesium citrate on yesterday with good results. Patient discussed that she has an EGD procedure next month. Patient discussed her weight today is up 3 lbs and she took extra lasix and metolozone per MD medication protocol.   Objective:  BP 100/60 mmHg  Pulse 87  Resp 20  Wt 211 lb 9.6 oz (95.981 kg)  SpO2 97% Review of Systems  Constitutional: Negative.   HENT: Negative.   Eyes: Negative.   Respiratory: Negative.   Cardiovascular: Positive for leg swelling. Negative for chest pain.  Gastrointestinal: Positive for constipation.  Genitourinary: Negative.   Musculoskeletal: Negative for falls.  Skin: Positive for itching.  Neurological: Negative.   Psychiatric/Behavioral: Negative.     Physical Exam  Constitutional: She is oriented to person, place, and time. She appears well-developed and well-nourished.  Cardiovascular: Normal rate and normal heart sounds.   Respiratory: Effort normal.  GI: Soft.  Neurological: She is alert and oriented to person, place, and time.  Skin: Skin is warm and dry.  Psychiatric: She has a normal mood and affect. Her behavior is normal. Judgment and thought content normal.    Encounter Medications:   Outpatient Encounter Prescriptions as of 02/15/2016  Medication Sig Note  . acetaminophen (TYLENOL ARTHRITIS PAIN) 650 MG CR tablet Take 1,300 mg by mouth every 8 (eight) hours as needed. pain   . albuterol  (PROVENTIL HFA) 108 (90 BASE) MCG/ACT inhaler Inhale 2 puffs into the lungs every 6 (six) hours as needed. For shortness of breath   . ALPRAZolam (XANAX) 0.5 MG tablet Take 0.5 mg by mouth daily as needed for anxiety.    Marland Kitchen amiodarone (PACERONE) 200 MG tablet Take 1 tablet (200 mg total) by mouth daily.   Marland Kitchen apixaban (ELIQUIS) 5 MG TABS tablet Take 1 tablet (5 mg total) by mouth 2 (two) times daily.   Marland Kitchen atorvastatin (LIPITOR) 40 MG tablet Take 40 mg by mouth daily.     . baclofen (LIORESAL) 20 MG tablet Take 20 mg by mouth 2 (two) times daily as needed for muscle spasms.   . busPIRone (BUSPAR) 30 MG tablet Take 30 mg by mouth 2 (two) times daily.    . carvedilol (COREG) 6.25 MG tablet Take 1 tablet (6.25 mg total) by mouth 2 (two) times daily with a meal.   . digoxin (LANOXIN) 0.125 MG tablet Take 1 tablet (0.125 mg total) by mouth every other day.   . docusate sodium (COLACE) 100 MG capsule Take 3 capsules (300 mg total) by mouth at bedtime.   . DULoxetine (CYMBALTA) 30 MG capsule Take 30 mg by mouth daily.   . furosemide (LASIX) 20 MG tablet Take 3 tablets (60 mg total) by mouth 2 (two) times daily. (Patient taking differently: Take 60 mg by mouth 2 (two) times daily. If 1 to 2 pound weight gain in a day, take extra 20 mg dose)   . Garlic [id number] MG CAPS Take 1 capsule by mouth.   Marland Kitchen  Glucosamine 500 MG CAPS Take 1 capsule by mouth daily.     Marland Kitchen HUMALOG KWIKPEN 100 UNIT/ML KiwkPen Inject 7-34 Units into the skin 3 (three) times daily. Sliding scale TID   . HUMALOG MIX 75/25 KWIKPEN (75-25) 100 UNIT/ML Kwikpen Inject 75 Units into the skin 2 (two) times daily.   . hydrOXYzine (ATARAX/VISTARIL) 50 MG tablet Take 50 mg by mouth every 6 (six) hours as needed for itching. 01/24/2016: Reports using once daily at bedtime when needed for itching  . Insulin Pen Needle (B-D ULTRAFINE III SHORT PEN) 31G X 8 MM MISC 18/day as directed dx 250.00   . ivabradine (CORLANOR) 5 MG TABS tablet Take 1 tablet (5 mg total) by  mouth 2 (two) times daily with a meal.   . levothyroxine (SYNTHROID, LEVOTHROID) 75 MCG tablet Take 75 mcg by mouth daily before breakfast.    . Melatonin 1 MG TABS Take 0.5 mg by mouth at bedtime.   . metolazone (ZAROXOLYN) 2.5 MG tablet Take 2.5 mg by mouth as needed (for weight gain >3 lbs).   . mometasone (NASONEX) 50 MCG/ACT nasal spray Place 1 spray into both nostrils daily. allergies   . Multiple Minerals (CALCIUM-MAGNESIUM-ZINC) TABS Take 1 tablet by mouth.   . nitroGLYCERIN (NITROSTAT) 0.4 MG SL tablet Place 1 tablet (0.4 mg total) under the tongue every 5 (five) minutes as needed. For chest pain   . pantoprazole (PROTONIX) 40 MG tablet Take 40 mg by mouth 2 (two) times daily.     . polyethylene glycol powder (GLYCOLAX/MIRALAX) powder Mix 17 grams in 8 oz of water twice daily   . potassium chloride SA (K-DUR,KLOR-CON) 20 MEQ tablet Take 3 tablets (60 mEq total) by mouth 3 (three) times daily. With an additional 40 meq on Metolazone   . pyridOXINE (VITAMIN B-6) 100 MG tablet Take 100 mg by mouth daily.   . ramipril (ALTACE) 2.5 MG capsule Take 1 capsule (2.5 mg total) by mouth daily.   Marland Kitchen rOPINIRole (REQUIP) 1 MG tablet Take 1 mg by mouth at bedtime.     . Simethicone Extra Strength 125 MG CAPS Take by mouth.   . spironolactone (ALDACTONE) 25 MG tablet Take 25 mg by mouth daily.   . SUMAtriptan (IMITREX) 100 MG tablet Take 1 tablet by mouth daily as needed.   . tiotropium (SPIRIVA) 18 MCG inhalation capsule Place 18 mcg into inhaler and inhale daily.     . vitamin B-12 (CYANOCOBALAMIN) 1000 MCG tablet Take 1,000 mcg by mouth daily.   . vitamin C (ASCORBIC ACID) 500 MG tablet Take 500 mg by mouth daily.   . vitamin E 100 UNIT capsule Take 400 Units by mouth daily.    No facility-administered encounter medications on file as of 02/15/2016.    Functional Status:   In your present state of health, do you have any difficulty performing the following activities: 01/08/2016 12/21/2015  Hearing?  N N  Vision? N N  Difficulty concentrating or making decisions? N N  Walking or climbing stairs? N N  Dressing or bathing? N N  Doing errands, shopping? N N  Preparing Food and eating ? N -  Using the Toilet? N -  In the past six months, have you accidently leaked urine? N -  Do you have problems with loss of bowel control? N -  Managing your Medications? N -  Managing your Finances? N -  Housekeeping or managing your Housekeeping? N -    Fall/Depression Screening:    PHQ  2/9 Scores 01/24/2016 01/02/2016  PHQ - 2 Score 0 0    Assessment:  Routine home visit  Complaint of itching-  Follow up with PCP scheduled Constipation- no bleeding noted, encourage to continue medication protocol and discuss with MD before adding additional medication Heart Failure- Reviewed need for close home management of Heart failure calling  MD for weight gains greater than 3 pounds in a day and 5 in a week.. Reviewed with teachback.yellow zone symptoms and importance of notifying MD. Patient keeps a chart of her weights, sodium intake for each meal with goal of limiting sodium to less than 2 grams and fluid intake less than 1500 per day.  [name] declined on RN checking to see if she is appropriate for [facility] scale program, she has her system set up at home with her scales that she prefers. Diabetes- keeps record to blood sugars carbs per meal her goal is 45 grams per meals  and insulin, patient reports blood sugars have been running 200 to 300's, has scheduled PCP visit to discuss blood sugar elevations. No hypoglycemic episodes, patient admits that her diet could be better, admits to eating extra desserts on last night.  Discussed concern for further care management needs,post transition of care program.Denies new concerns at this time. No readmission to hospital Patient able to state action plan for weights greater than 3 pounds in a day or 5 pounds in a week.  Discussed health coach role in continuing  education and management of Heart Failure patient is agreeable to telephonic health coach management..   Encouraged patient to notify RNCM if new concerns arise prior to outreach from telephonic case manager.  Plan:  Will transition to health coach for continued education and management of her Heart Failure.. Patient will notify MD per heart failure yellow zone action plan for weight gain Will notify MD of discipline closure.      Joylene Draft, RN, [place] [facility] [phone]- Mobile [phone]- Toll Free Main Office

## 2016-02-16 ENCOUNTER — Encounter: Payer: Self-pay | Admitting: *Deleted

## 2016-02-19 ENCOUNTER — Encounter: Payer: Self-pay | Admitting: *Deleted

## 2016-02-19 NOTE — Patient Outreach (Signed)
[facility]) Care Management  02/19/2016  [name] [birth year] [id number]   [facility] welcome and introduction letter sent. Plan: RN will call patient within the month of August  [name] [facility] Care Management [phone]

## 2016-02-20 ENCOUNTER — Encounter: Payer: Self-pay | Admitting: Internal Medicine

## 2016-02-23 ENCOUNTER — Inpatient Hospital Stay (HOSPITAL_COMMUNITY)
Admission: EM | Admit: 2016-02-23 | Discharge: 2016-02-25 | DRG: 378 | Disposition: A | Discharge status: Home / Self Care | Payer: Medicare Other | Attending: Internal Medicine | Admitting: Internal Medicine

## 2016-02-23 ENCOUNTER — Encounter (HOSPITAL_COMMUNITY): Payer: Self-pay | Admitting: *Deleted

## 2016-02-23 DIAGNOSIS — Z91030 Bee allergy status: Secondary | ICD-10-CM

## 2016-02-23 DIAGNOSIS — Z95810 Presence of automatic (implantable) cardiac defibrillator: Secondary | ICD-10-CM | POA: Diagnosis not present

## 2016-02-23 DIAGNOSIS — I5022 Chronic systolic (congestive) heart failure: Secondary | ICD-10-CM

## 2016-02-23 DIAGNOSIS — D649 Anemia, unspecified: Secondary | ICD-10-CM | POA: Diagnosis present

## 2016-02-23 DIAGNOSIS — E039 Hypothyroidism, unspecified: Secondary | ICD-10-CM | POA: Diagnosis present

## 2016-02-23 DIAGNOSIS — Z888 Allergy status to other drugs, medicaments and biological substances status: Secondary | ICD-10-CM | POA: Diagnosis not present

## 2016-02-23 DIAGNOSIS — K31811 Angiodysplasia of stomach and duodenum with bleeding: Principal | ICD-10-CM | POA: Diagnosis present

## 2016-02-23 DIAGNOSIS — K219 Gastro-esophageal reflux disease without esophagitis: Secondary | ICD-10-CM | POA: Diagnosis not present

## 2016-02-23 DIAGNOSIS — K317 Polyp of stomach and duodenum: Secondary | ICD-10-CM | POA: Diagnosis present

## 2016-02-23 DIAGNOSIS — Z91048 Other nonmedicinal substance allergy status: Secondary | ICD-10-CM

## 2016-02-23 DIAGNOSIS — D62 Acute posthemorrhagic anemia: Secondary | ICD-10-CM | POA: Diagnosis not present

## 2016-02-23 DIAGNOSIS — Z9011 Acquired absence of right breast and nipple: Secondary | ICD-10-CM | POA: Diagnosis not present

## 2016-02-23 DIAGNOSIS — I481 Persistent atrial fibrillation: Secondary | ICD-10-CM | POA: Diagnosis present

## 2016-02-23 DIAGNOSIS — Z87891 Personal history of nicotine dependence: Secondary | ICD-10-CM

## 2016-02-23 DIAGNOSIS — G2581 Restless legs syndrome: Secondary | ICD-10-CM | POA: Diagnosis not present

## 2016-02-23 DIAGNOSIS — K921 Melena: Secondary | ICD-10-CM | POA: Diagnosis not present

## 2016-02-23 DIAGNOSIS — Z794 Long term (current) use of insulin: Secondary | ICD-10-CM

## 2016-02-23 DIAGNOSIS — K922 Gastrointestinal hemorrhage, unspecified: Secondary | ICD-10-CM | POA: Diagnosis present

## 2016-02-23 DIAGNOSIS — K31819 Angiodysplasia of stomach and duodenum without bleeding: Secondary | ICD-10-CM | POA: Diagnosis not present

## 2016-02-23 DIAGNOSIS — Z886 Allergy status to analgesic agent status: Secondary | ICD-10-CM | POA: Diagnosis not present

## 2016-02-23 DIAGNOSIS — I110 Hypertensive heart disease with heart failure: Secondary | ICD-10-CM | POA: Diagnosis present

## 2016-02-23 DIAGNOSIS — E1049 Type 1 diabetes mellitus with other diabetic neurological complication: Secondary | ICD-10-CM | POA: Diagnosis present

## 2016-02-23 DIAGNOSIS — J449 Chronic obstructive pulmonary disease, unspecified: Secondary | ICD-10-CM | POA: Diagnosis present

## 2016-02-23 DIAGNOSIS — I10 Essential (primary) hypertension: Secondary | ICD-10-CM | POA: Diagnosis present

## 2016-02-23 DIAGNOSIS — E876 Hypokalemia: Secondary | ICD-10-CM | POA: Diagnosis not present

## 2016-02-23 DIAGNOSIS — E785 Hyperlipidemia, unspecified: Secondary | ICD-10-CM | POA: Diagnosis present

## 2016-02-23 DIAGNOSIS — Z882 Allergy status to sulfonamides status: Secondary | ICD-10-CM

## 2016-02-23 DIAGNOSIS — Z7901 Long term (current) use of anticoagulants: Secondary | ICD-10-CM | POA: Diagnosis not present

## 2016-02-23 DIAGNOSIS — N189 Chronic kidney disease, unspecified: Secondary | ICD-10-CM | POA: Diagnosis not present

## 2016-02-23 DIAGNOSIS — Z66 Do not resuscitate: Secondary | ICD-10-CM | POA: Diagnosis present

## 2016-02-23 DIAGNOSIS — I129 Hypertensive chronic kidney disease with stage 1 through stage 4 chronic kidney disease, or unspecified chronic kidney disease: Secondary | ICD-10-CM | POA: Diagnosis not present

## 2016-02-23 DIAGNOSIS — E782 Mixed hyperlipidemia: Secondary | ICD-10-CM | POA: Diagnosis present

## 2016-02-23 DIAGNOSIS — E1043 Type 1 diabetes mellitus with diabetic autonomic (poly)neuropathy: Secondary | ICD-10-CM | POA: Diagnosis not present

## 2016-02-23 DIAGNOSIS — I480 Paroxysmal atrial fibrillation: Secondary | ICD-10-CM | POA: Diagnosis present

## 2016-02-23 DIAGNOSIS — D509 Iron deficiency anemia, unspecified: Secondary | ICD-10-CM | POA: Diagnosis present

## 2016-02-23 DIAGNOSIS — Z79899 Other long term (current) drug therapy: Secondary | ICD-10-CM

## 2016-02-23 DIAGNOSIS — Q2733 Arteriovenous malformation of digestive system vessel: Secondary | ICD-10-CM

## 2016-02-23 DIAGNOSIS — I252 Old myocardial infarction: Secondary | ICD-10-CM

## 2016-02-23 DIAGNOSIS — Z885 Allergy status to narcotic agent status: Secondary | ICD-10-CM | POA: Diagnosis not present

## 2016-02-23 DIAGNOSIS — Z90710 Acquired absence of both cervix and uterus: Secondary | ICD-10-CM

## 2016-02-23 DIAGNOSIS — D500 Iron deficiency anemia secondary to blood loss (chronic): Secondary | ICD-10-CM | POA: Diagnosis not present

## 2016-02-23 DIAGNOSIS — K589 Irritable bowel syndrome without diarrhea: Secondary | ICD-10-CM | POA: Diagnosis present

## 2016-02-23 DIAGNOSIS — G4733 Obstructive sleep apnea (adult) (pediatric): Secondary | ICD-10-CM | POA: Diagnosis present

## 2016-02-23 DIAGNOSIS — Z881 Allergy status to other antibiotic agents status: Secondary | ICD-10-CM | POA: Diagnosis not present

## 2016-02-23 LAB — HEMOGLOBIN AND HEMATOCRIT, BLOOD
HEMATOCRIT: 24.9 % — AB (ref 36.0–46.0)
HEMOGLOBIN: 7.2 g/dL — AB (ref 12.0–15.0)

## 2016-02-23 LAB — COMPREHENSIVE METABOLIC PANEL
ALBUMIN: 3.2 g/dL — AB (ref 3.5–5.0)
ALT: 16 U/L (ref 14–54)
ANION GAP: 9 (ref 5–15)
AST: 20 U/L (ref 15–41)
Alkaline Phosphatase: 63 U/L (ref 38–126)
BUN: 22 mg/dL — ABNORMAL HIGH (ref 6–20)
CHLORIDE: 96 mmol/L — AB (ref 101–111)
CO2: 28 mmol/L (ref 22–32)
Calcium: 9.2 mg/dL (ref 8.9–10.3)
Creatinine, Ser: 1.16 mg/dL — ABNORMAL HIGH (ref 0.44–1.00)
GFR calc non Af Amer: 50 mL/min — ABNORMAL LOW (ref >60)
GFR, EST AFRICAN AMERICAN: 58 mL/min — AB (ref >60)
GLUCOSE: 180 mg/dL — AB (ref 65–99)
POTASSIUM: 3.3 mmol/L — AB (ref 3.5–5.1)
SODIUM: 133 mmol/L — AB (ref 135–145)
Total Bilirubin: 0.3 mg/dL (ref 0.3–1.2)
Total Protein: 6.9 g/dL (ref 6.5–8.1)

## 2016-02-23 LAB — CBC
HEMATOCRIT: 22.9 % — AB (ref 36.0–46.0)
HEMOGLOBIN: 6.7 g/dL — AB (ref 12.0–15.0)
MCH: 22.5 pg — AB (ref 26.0–34.0)
MCHC: 29.3 g/dL — AB (ref 30.0–36.0)
MCV: 76.8 fL — ABNORMAL LOW (ref 78.0–100.0)
Platelets: 375 10*3/uL (ref 150–400)
RBC: 2.98 MIL/uL — AB (ref 3.87–5.11)
RDW: 18.3 % — ABNORMAL HIGH (ref 11.5–15.5)
WBC: 9.5 10*3/uL (ref 4.0–10.5)

## 2016-02-23 LAB — RETICULOCYTES
RBC.: 3.16 MIL/uL — ABNORMAL LOW (ref 3.87–5.11)
RETIC COUNT ABSOLUTE: 85.3 10*3/uL (ref 19.0–186.0)
RETIC CT PCT: 2.7 % (ref 0.4–3.1)

## 2016-02-23 LAB — IRON AND TIBC
Iron: 18 ug/dL — ABNORMAL LOW (ref 28–170)
Saturation Ratios: 4 % — ABNORMAL LOW (ref 10.4–31.8)
TIBC: 512 ug/dL — ABNORMAL HIGH (ref 250–450)
UIBC: 494 ug/dL

## 2016-02-23 LAB — POC OCCULT BLOOD, ED: FECAL OCCULT BLD: POSITIVE — AB

## 2016-02-23 LAB — PREPARE RBC (CROSSMATCH)

## 2016-02-23 LAB — GLUCOSE, CAPILLARY: GLUCOSE-CAPILLARY: 195 mg/dL — AB (ref 65–99)

## 2016-02-23 LAB — FOLATE: Folate: 10.8 ng/mL (ref >5.9)

## 2016-02-23 LAB — MRSA PCR SCREENING: MRSA BY PCR: NEGATIVE

## 2016-02-23 LAB — VITAMIN B12: Vitamin B-12: 561 pg/mL (ref 180–914)

## 2016-02-23 LAB — FERRITIN: FERRITIN: 3 ng/mL — AB (ref 11–307)

## 2016-02-23 MED ORDER — ONDANSETRON HCL 4 MG/2ML IJ SOLN: 4.0000 mg | Freq: Four times a day (QID) | INTRAMUSCULAR | Status: DC | PRN

## 2016-02-23 MED ORDER — ACETAMINOPHEN 650 MG RE SUPP: 650.0000 mg | Freq: Four times a day (QID) | RECTAL | Status: DC | PRN

## 2016-02-23 MED ORDER — INSULIN ASPART 100 UNIT/ML ~~LOC~~ SOLN: 0.0000 [IU] | Freq: Three times a day (TID) | SUBCUTANEOUS | Status: DC

## 2016-02-23 MED ORDER — PANTOPRAZOLE SODIUM 40 MG IV SOLR
40.0000 mg | Freq: Two times a day (BID) | INTRAVENOUS | Status: DC
  Administered 2016-02-23 – 2016-02-24 (×2): 40 mg via INTRAVENOUS
  Filled 2016-02-23 (×2): qty 40

## 2016-02-23 MED ORDER — BUSPIRONE HCL 15 MG PO TABS
30.0000 mg | ORAL_TABLET | Freq: Two times a day (BID) | ORAL | Status: DC
  Administered 2016-02-23 – 2016-02-25 (×4): 30 mg via ORAL
  Filled 2016-02-23: qty 6
  Filled 2016-02-23: qty 2
  Filled 2016-02-23: qty 6
  Filled 2016-02-23: qty 2

## 2016-02-23 MED ORDER — IVABRADINE HCL 5 MG PO TABS
5.0000 mg | ORAL_TABLET | Freq: Two times a day (BID) | ORAL | Status: DC
  Administered 2016-02-24 (×2): 5 mg via ORAL
  Filled 2016-02-23 (×3): qty 1

## 2016-02-23 MED ORDER — ONDANSETRON HCL 4 MG PO TABS: 4.0000 mg | ORAL_TABLET | Freq: Four times a day (QID) | ORAL | Status: DC | PRN

## 2016-02-23 MED ORDER — ALBUTEROL SULFATE (2.5 MG/3ML) 0.083% IN NEBU: 2.5000 mg | INHALATION_SOLUTION | Freq: Four times a day (QID) | RESPIRATORY_TRACT | Status: DC | PRN

## 2016-02-23 MED ORDER — ALPRAZOLAM 0.5 MG PO TABS: 0.5000 mg | ORAL_TABLET | Freq: Every day | ORAL | Status: DC | PRN

## 2016-02-23 MED ORDER — TIOTROPIUM BROMIDE MONOHYDRATE 18 MCG IN CAPS
18.0000 ug | ORAL_CAPSULE | Freq: Every day | RESPIRATORY_TRACT | Status: DC
  Administered 2016-02-24 – 2016-02-25 (×2): 18 ug via RESPIRATORY_TRACT
  Filled 2016-02-23: qty 5

## 2016-02-23 MED ORDER — INSULIN ASPART 100 UNIT/ML ~~LOC~~ SOLN
0.0000 [IU] | SUBCUTANEOUS | Status: DC
Start: 2016-02-23 — End: 2016-02-23
  Administered 2016-02-23: 2 [IU] via SUBCUTANEOUS

## 2016-02-23 MED ORDER — ROPINIROLE HCL 1 MG PO TABS
1.0000 mg | ORAL_TABLET | Freq: Every day | ORAL | Status: DC
  Administered 2016-02-23 – 2016-02-24 (×2): 1 mg via ORAL
  Filled 2016-02-23 (×2): qty 1

## 2016-02-23 MED ORDER — SODIUM CHLORIDE 0.9% FLUSH
3.0000 mL | Freq: Two times a day (BID) | INTRAVENOUS | Status: DC
  Administered 2016-02-24: 3 mL via INTRAVENOUS

## 2016-02-23 MED ORDER — DULOXETINE HCL 30 MG PO CPEP
30.0000 mg | ORAL_CAPSULE | Freq: Every day | ORAL | Status: DC
  Administered 2016-02-23 – 2016-02-25 (×3): 30 mg via ORAL
  Filled 2016-02-23 (×3): qty 1

## 2016-02-23 MED ORDER — FLUTICASONE PROPIONATE 50 MCG/ACT NA SUSP
2.0000 | Freq: Every day | NASAL | Status: DC
Start: 2016-02-24 — End: 2016-02-25
  Administered 2016-02-24 – 2016-02-25 (×2): 2 via NASAL
  Filled 2016-02-23: qty 16

## 2016-02-23 MED ORDER — SODIUM CHLORIDE 0.9 % IV BOLUS (SEPSIS): 1000.0000 mL | Freq: Once | INTRAVENOUS | Status: DC

## 2016-02-23 MED ORDER — DIGOXIN 125 MCG PO TABS
0.1250 mg | ORAL_TABLET | ORAL | Status: DC
  Administered 2016-02-24: 0.125 mg via ORAL
  Filled 2016-02-23: qty 1

## 2016-02-23 MED ORDER — LEVOTHYROXINE SODIUM 75 MCG PO TABS
75.0000 ug | ORAL_TABLET | Freq: Every day | ORAL | Status: DC
  Administered 2016-02-24: 75 ug via ORAL
  Filled 2016-02-23: qty 1

## 2016-02-23 MED ORDER — AMIODARONE HCL 200 MG PO TABS
200.0000 mg | ORAL_TABLET | Freq: Every day | ORAL | Status: DC
  Administered 2016-02-23 – 2016-02-25 (×3): 200 mg via ORAL
  Filled 2016-02-23 (×3): qty 1

## 2016-02-23 MED ORDER — DIGOXIN 125 MCG PO TABS: 0.1250 mg | ORAL_TABLET | ORAL | Status: DC

## 2016-02-23 MED ORDER — ACETAMINOPHEN 325 MG PO TABS: 650.0000 mg | ORAL_TABLET | Freq: Four times a day (QID) | ORAL | Status: DC | PRN

## 2016-02-23 MED ORDER — INSULIN ASPART PROT & ASPART (70-30 MIX) 100 UNIT/ML ~~LOC~~ SUSP
75.0000 [IU] | Freq: Two times a day (BID) | SUBCUTANEOUS | Status: DC
Start: 2016-02-23 — End: 2016-02-25
  Administered 2016-02-23 – 2016-02-24 (×3): 75 [IU] via SUBCUTANEOUS
  Filled 2016-02-23: qty 10

## 2016-02-23 MED ORDER — SUMATRIPTAN SUCCINATE 100 MG PO TABS
100.0000 mg | ORAL_TABLET | Freq: Every day | ORAL | Status: DC | PRN
  Filled 2016-02-23: qty 1

## 2016-02-23 NOTE — ED Notes (Signed)
Discussed stepdown placement w/ Dr. Karleen Hampshire. Pt is appropriate for stepdown d/t hypotension, hgb 6.7, extensive medical hx.

## 2016-02-23 NOTE — H&P (Signed)
History and Physical    [name] [id number] DOB: Aug 16, 1955 DOA: 02/23/2016  PCP: Nicoletta Dress, MD  Patient coming from: Home  Chief Complaint: dizziness, was sent to ED by per PCP for hemoglobin of 7.   HPI: [name] is a 60 y.o. female with medical history significant of chronic systolic heart failure, hypertension, diabetes mellitus, hyperlipidemia, OSA, hypothyroidism, anxiety, s/p AICD, pacemaker placement, was asked by her PCP to go to ED for hemoglobin of 7. On arrival to ED, she underwent lab work, showing hemoglobin of 6.7, potassium of 3.3, sodium of 133. She also reports some dizziness and feeling tired. She denies any chest pain, or sob, palpitations, cough, fever or chills. She reports black stools since yesterday, but no frank bleeding per rectum. No hematemesis.  She was referred to medical service for admission and Gi from [facility] consulted.   ED Course: 1 unit of prbc transfusion ordered.2 units getting ready.   Review of Systems: As per HPI otherwise 10 point review of systems negative.    Past Medical History  Diagnosis Date  . 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 DYSLIPIDEMIA 05/01/2009  . OBESITY 07/24/2007  . OBSTRUCTIVE SLEEP APNEA 07/24/2007    with CPAP compliance  . RESTLESS LEG SYNDROME 07/24/2007  . SYSTOLIC HEART FAILURE, CHRONIC 01/05/2009    a. chemo induced cardiomyopathy b. cMRI (02/2009) EF 45% c. Myoview 05/2010: EF 59%, no ischemia d. RHC (01/2012) RA 20, RV 55/13/22, PA 56/34 (44), PCWP 28, Fick CO/CI: 3.1 / 1.5, PVR 5.3 WU, PA sat 42% & 48% e. ECHO (10/2012) EF 20-25%, diff HK, MV calcified annulus f. ECHO (03/2014) EF 30-35%, sev HK, inferior/inferoseptal walls, mild MR  . 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 Hypertension   . History of breast cancer 2006    with return in 2008, s/p mastectomy, XRT and chemotherapy  . Osteoarthritis   . Anxiety   . Depression     followed by a psychiatrist  . Asthma   . GERD (gastroesophageal reflux  disease)   . Gastroparesis   . IBS (irritable bowel syndrome)   . Myocardial infarction ([place])   . ICD (implantable cardiac defibrillator) in place 11/10/2012    ICD (11/2012)  . COPD (chronic obstructive pulmonary disease) ([place])   . Persistent atrial fibrillation ([place]) 09/2014  . AICD (automatic cardioverter/defibrillator) present   . Presence of permanent cardiac pacemaker   . CHF (congestive heart failure) [facility])     Past Surgical History  Procedure Laterality Date  . Mastectomy      right  . Abdominal hysterectomy  1991  . Cardiac defibrillator placement  11/10/2012    SJM Fortify Assura VR implanted by Dr Rayann Heman for primary prevention  . Cholecystectomy    . Wrist surgery Bilateral   . Knee cartilage surgery    . Bi-ventricular implantable cardioverter defibrillator N/A 11/10/2012    SJM Forify Assura single chamber ICD  . Cardiac catheterization N/A 08/21/2015    Procedure: Right/Left Heart Cath and Coronary Angiography;  Surgeon: Jolaine Artist, MD;  Location: [facility];  Service: Cardiovascular;  Laterality: N/A;  . Esophagogastroduodenoscopy N/A 11/16/2015    Procedure: ESOPHAGOGASTRODUODENOSCOPY (EGD);  Surgeon: Manus Gunning, MD;  Location: [facility];  Service: Gastroenterology;  Laterality: N/A;  . Colonoscopy N/A 11/17/2015    Procedure: COLONOSCOPY;  Surgeon: Jerene Bears, MD;  Location: [facility] ENDOSCOPY;  Service: Endoscopy;  Laterality: N/A;  . Givens capsule study N/A 12/11/2015    Procedure: GIVENS CAPSULE STUDY;  Surgeon:  Manus Gunning, MD;  Location: [facility];  Service: Gastroenterology;  Laterality: N/A;     reports that she quit smoking about 14 years ago. She has never used smokeless tobacco. She reports that she does not drink alcohol or use illicit drugs.  Allergies  Allergen Reactions  . Bee Venom Anaphylaxis  . Aspirin Hives  . Ciprofloxacin Hives  . Codeine Nausea Only    "can take ONLY if she eats with med"  . Naproxen  Hives  . Other Other (See Comments)    ANY TYPES OF METAL-CAUSES BLISTERS SKIN AND ITCHING   . Sulfonamide Derivatives Hives  . Xarelto [Rivaroxaban] Hives  . Atorvastatin Itching  . Nsaids Itching and Rash  . Tape Rash    Also allergic to metal    Family History  Problem Relation Age of Onset  . Diabetes Mellitus II Mother   . Coronary artery disease Father     CABG x 3  . Breast cancer Cousin   . Breast cancer Cousin   . Uterine cancer Cousin   . Lung cancer Mother   . Congestive Heart Failure Mother   . Hypertension Father   . Diabetes Mellitus II Father    Reviewed .   Prior to Admission medications   Medication Sig Start Date End Date Taking? Authorizing Provider  albuterol (PROVENTIL HFA) 108 (90 BASE) MCG/ACT inhaler Inhale 2 puffs into the lungs every 6 (six) hours as needed. For shortness of breath   Yes Historical Provider, MD  ALPRAZolam Duanne Moron) 0.5 MG tablet Take 0.5 mg by mouth daily as needed for anxiety.  11/06/15  Yes Historical Provider, MD  amiodarone (PACERONE) 200 MG tablet Take 1 tablet (200 mg total) by mouth daily. 09/05/15  Yes Amy D Clegg, NP  apixaban (ELIQUIS) 5 MG TABS tablet Take 1 tablet (5 mg total) by mouth 2 (two) times daily. 02/09/16  Yes Amy D Clegg, NP  busPIRone (BUSPAR) 30 MG tablet Take 30 mg by mouth 2 (two) times daily.    Yes Historical Provider, MD  carvedilol (COREG) 6.25 MG tablet Take 1 tablet (6.25 mg total) by mouth 2 (two) times daily with a meal. 11/18/15  Yes Mercy Riding, MD  digoxin (LANOXIN) 0.125 MG tablet Take 1 tablet (0.125 mg total) by mouth every other day. 06/28/15  Yes Jolaine Artist, MD  docusate sodium (COLACE) 100 MG capsule Take 3 capsules (300 mg total) by mouth at bedtime. 10/03/15  Yes Amy D Clegg, NP  DULoxetine (CYMBALTA) 30 MG capsule Take 30 mg by mouth daily.   Yes Amy A Moon, NP  furosemide (LASIX) 20 MG tablet Take 3 tablets (60 mg total) by mouth 2 (two) times daily. Patient taking differently: Take 60  mg by mouth 2 (two) times daily. If 1 to 2 pound weight gain in a day, take extra 20 mg dose 02/01/16  Yes Satira Mccallum Tillery, PA-C  Garlic [id number] MG CAPS Take 1 capsule by mouth daily.    Yes Historical Provider, MD  Glucosamine 500 MG CAPS Take 1 capsule by mouth daily.     Yes Historical Provider, MD  HUMALOG KWIKPEN 100 UNIT/ML KiwkPen Inject 7-34 Units into the skin 3 (three) times daily. Sliding scale TID 10/30/15  Yes Historical Provider, MD  HUMALOG MIX 75/25 KWIKPEN (75-25) 100 UNIT/ML Kwikpen Inject 75 Units into the skin 2 (two) times daily. 11/21/15  Yes Historical Provider, MD  hydrOXYzine (ATARAX/VISTARIL) 50 MG tablet Take 50 mg by mouth every 6 (  six) hours as needed for itching.   Yes Historical Provider, MD  ivabradine (CORLANOR) 5 MG TABS tablet Take 1 tablet (5 mg total) by mouth 2 (two) times daily with a meal. 02/09/16  Yes Amy D Clegg, NP  levothyroxine (SYNTHROID, LEVOTHROID) 75 MCG tablet Take 75 mcg by mouth daily before breakfast.    Yes Historical Provider, MD  Melatonin 1 MG TABS Take 0.5 mg by mouth at bedtime.   Yes Historical Provider, MD  metolazone (ZAROXOLYN) 2.5 MG tablet Take 2.5 mg by mouth as needed (for weight gain >3 lbs).   Yes Historical Provider, MD  mometasone (NASONEX) 50 MCG/ACT nasal spray Place 1 spray into both nostrils daily. allergies   Yes Historical Provider, MD  Multiple Minerals (CALCIUM-MAGNESIUM-ZINC) TABS Take 1 tablet by mouth daily.    Yes Historical Provider, MD  nitroGLYCERIN (NITROSTAT) 0.4 MG SL tablet Place 1 tablet (0.4 mg total) under the tongue every 5 (five) minutes as needed. For chest pain 10/18/15  Yes Jolaine Artist, MD  pantoprazole (PROTONIX) 40 MG tablet Take 40 mg by mouth 2 (two) times daily.     Yes Historical Provider, MD  polyethylene glycol powder (GLYCOLAX/MIRALAX) powder Mix 17 grams in 8 oz of water twice daily 01/05/16  Yes Manus Gunning, MD  potassium chloride SA (K-DUR,KLOR-CON) 20 MEQ tablet Take 3 tablets  (60 mEq total) by mouth 3 (three) times daily. With an additional 40 meq on Metolazone 02/01/16  Yes Shirley Friar, PA-C  pyridOXINE (VITAMIN B-6) 100 MG tablet Take 100 mg by mouth daily.   Yes Historical Provider, MD  ramipril (ALTACE) 2.5 MG capsule Take 1 capsule (2.5 mg total) by mouth daily. 02/01/16  Yes Shirley Friar, PA-C  rOPINIRole (REQUIP) 1 MG tablet Take 1 mg by mouth at bedtime.     Yes Historical Provider, MD  Simethicone Extra Strength 125 MG CAPS Take 1 tablet by mouth daily as needed (for bloating). Reported on 02/23/2016   Yes Historical Provider, MD  spironolactone (ALDACTONE) 25 MG tablet Take 25 mg by mouth daily.   Yes Historical Provider, MD  SUMAtriptan (IMITREX) 100 MG tablet Take 1 tablet by mouth daily as needed for migraine.  11/28/15  Yes Historical Provider, MD  tiotropium (SPIRIVA) 18 MCG inhalation capsule Place 18 mcg into inhaler and inhale daily.     Yes Historical Provider, MD  vitamin B-12 (CYANOCOBALAMIN) 1000 MCG tablet Take 1,000 mcg by mouth daily.   Yes Historical Provider, MD  vitamin C (ASCORBIC ACID) 500 MG tablet Take 500 mg by mouth daily.   Yes Historical Provider, MD  vitamin E 100 UNIT capsule Take 400 Units by mouth daily.   Yes Historical Provider, MD    Physical Exam: Filed Vitals:   02/23/16 1715 02/23/16 1730 02/23/16 1800 02/23/16 1815  BP: 114/50 105/44 111/52 100/59  Pulse: 93 92 96 95  Temp:  98 F (36.7 C)    TempSrc:  Oral    Resp: 15 16 15 23   Height:      Weight:      SpO2: 95% 94% 97% 98%      Constitutional: NAD, calm, comfortable Filed Vitals:   02/23/16 1715 02/23/16 1730 02/23/16 1800 02/23/16 1815  BP: 114/50 105/44 111/52 100/59  Pulse: 93 92 96 95  Temp:  98 F (36.7 C)    TempSrc:  Oral    Resp: 15 16 15 23   Height:      Weight:  SpO2: 95% 94% 97% 98%   Eyes: PERRL, lids normal, pale conjunctiva.  ENMT: Mucous membranes are moist. Posterior pharynx clear of any exudate or  lesions.Normal dentition.  Neck: normal, supple, no masses, no thyromegaly Respiratory: clear to auscultation bilaterally, no wheezing, no crackles. Normal respiratory effort. No accessory muscle use.  Cardiovascular: Regular rate and rhythm, no murmurs / rubs / gallops. No extremity edema. 2+ pedal pulses. No carotid bruits.  Abdomen: no tenderness, no masses palpated. No hepatosplenomegaly. Bowel sounds positive.  Musculoskeletal: no clubbing / cyanosis. No joint deformity upper and lower extremities. Good ROM, no contractures. Normal muscle tone.  Skin: no rashes, lesions, ulcers. No induration Neurologic: CN 2-12 grossly intact. Sensation intact, DTR normal. Strength 5/5 in all 4.    Labs on Admission: I have personally reviewed following labs and imaging studies  CBC:  Recent Labs Lab 02/23/16 1450  WBC 9.5  HGB 6.7*  HCT 22.9*  MCV 76.8*  PLT 123456   Basic Metabolic Panel:  Recent Labs Lab 02/23/16 1450  NA 133*  K 3.3*  CL 96*  CO2 28  GLUCOSE 180*  BUN 22*  CREATININE 1.16*  CALCIUM 9.2   GFR: Estimated Creatinine Clearance: 59.3 mL/min (by C-G formula based on Cr of 1.16). Liver Function Tests:  Recent Labs Lab 02/23/16 1450  AST 20  ALT 16  ALKPHOS 63  BILITOT 0.3  PROT 6.9  ALBUMIN 3.2*   No results for input(s): LIPASE, AMYLASE in the last 168 hours. No results for input(s): AMMONIA in the last 168 hours. Coagulation Profile: No results for input(s): INR, PROTIME in the last 168 hours. Cardiac Enzymes: No results for input(s): CKTOTAL, CKMB, CKMBINDEX, TROPONINI in the last 168 hours. BNP (last 3 results) No results for input(s): PROBNP in the last 8760 hours. HbA1C: No results for input(s): HGBA1C in the last 72 hours. CBG: No results for input(s): GLUCAP in the last 168 hours. Lipid Profile: No results for input(s): CHOL, HDL, LDLCALC, TRIG, CHOLHDL, LDLDIRECT in the last 72 hours. Thyroid Function Tests: No results for input(s): TSH,  T4TOTAL, FREET4, T3FREE, THYROIDAB in the last 72 hours. Anemia Panel:  Recent Labs  02/23/16 1635  VITAMINB12 561  FOLATE 10.8  FERRITIN 3*  TIBC 512*  IRON 18*  RETICCTPCT 2.7   Urine analysis:    Component Value Date/Time   COLORURINE Straw 09/10/2014 2000   COLORURINE YELLOW 09/11/2009 0233   APPEARANCEUR Clear 09/10/2014 2000   APPEARANCEUR HAZY* 09/11/2009 0233   LABSPEC 1.011 09/10/2014 2000   LABSPEC 1.015 09/11/2009 0233   PHURINE 5.0 09/10/2014 2000   PHURINE 5.0 09/11/2009 0233   GLUCOSEU Negative 09/10/2014 2000   GLUCOSEU 500* 09/11/2009 0233   HGBUR Negative 09/10/2014 2000   HGBUR NEGATIVE 09/11/2009 0233   BILIRUBINUR Negative 09/10/2014 2000   BILIRUBINUR NEGATIVE 09/11/2009 0233   KETONESUR Negative 09/10/2014 2000   KETONESUR NEGATIVE 09/11/2009 0233   PROTEINUR Negative 09/10/2014 2000   PROTEINUR NEGATIVE 09/11/2009 0233   UROBILINOGEN 0.2 09/11/2009 0233   NITRITE Negative 09/10/2014 2000   NITRITE NEGATIVE 09/11/2009 0233   LEUKOCYTESUR Negative 09/10/2014 2000   LEUKOCYTESUR  09/11/2009 0233    NEGATIVE MICROSCOPIC NOT DONE ON URINES WITH NEGATIVE PROTEIN, BLOOD, LEUKOCYTES, NITRITE, OR GLUCOSE <1000 mg/dL.   Sepsis Labs: !!!!!!!!!!!!!!!!!!!!!!!!!!!!!!!!!!!!!!!!!!!! @LABRCNTIP (procalcitonin:4,lacticidven:4) )No results found for this or any previous visit (from the past 240 hour(s)).   Radiological Exams on Admission: No results found.  EKG: Independently reviewed.  Sinus with 1 degree AV block.  Assessment/Plan Active Problems:   GI bleeding   GI bleed   Acute Gi bleeding: Probably upper GI , as she reports malena, stool for occult blood is positive. Her baseline hemoglobin around 10 and she presented with hemoglobin of 6.7.  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 Last EGD on 12/2015. 1 unit prbc transfusion ordered, 2 units ready to be infused if needed. Get H&H every 8 hours, IV ppi BID. GI  consulted and recommendations pending.   Anemia of blood loss and iron deficiency anemia: Transfuse to keep hemoglobin greater than 8.  Iron supplementation will be added on discharge.    H/o chronic systolic heart failure with LVef of 30 to 35%.  Holding lasix and spironolactone, and coreg for hypotension.  Prn lasix after blood transfusion.   OSA: on CPAP.   PAF: Currently sinus, eliquis on hold for Gi bleed.  Coreg on hold for hypotension.  Resume amiodarone.    Type 1 dm: - resume SSI and humalog 75/25 home dose.   Hypokalemia replete as needed.    DVT prophylaxis: SCD'S Code Status: DNR. Family Communication: none at bedside.  Disposition Plan: pending further investigation.  Consults called: gi consulted.  Admission status: inpatient SDU.   Hosie Poisson MD [facility] Pager [phone]  If 7PM-7AM, please contact night-coverage [email or web address] Password [facility]  02/23/2016, 6:56 PM

## 2016-02-23 NOTE — ED Notes (Signed)
Pt reports recent GI bleeding and admitted for blood transfusions. Pt was having fatigue and dark stools, went to pcp today and sent here for hgb 7. No acute distress noted at this time.

## 2016-02-23 NOTE — ED Notes (Signed)
Attempted report x1. 

## 2016-02-23 NOTE — ED Provider Notes (Signed)
CSN: [id number]     Arrival date & time 02/23/16  1435 History   First MD Initiated Contact with Patient 02/23/16 1512     Chief Complaint  Patient presents with  . Abnormal Lab    (Consider location/radiation/quality/duration/timing/severity/associated sxs/prior Treatment) The history is provided by the patient and medical records. No language interpreter was used.    [name] is a 60 y.o. female  with a PMH of HLD, CHF, HTN, afib, IBS who presents to the Emergency Department from PCP for low hemoglobin of 7.1 today. Patient states over the last week she has been extremely tired, often unable to get up and walk across the room. Yesterday, she experienced dark stool she described as black and thick. Patient states symptoms today are similar to what she experienced back in April when she had a hemoglobin of 7 and required transfusion, therefore she called her primary doctor to check her lab work. At that time, she was also experiencing visual changes which she denies this point. Per PCP recommendations, she held her Eloquis this morning, but did take it last night. Admits to constipation, taking 2. MiraLAX daily along with Colace. Denies chest pain, shortness of breath, abdominal pain, diarrhea, n/v.  Past Medical History  Diagnosis Date  . 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 DYSLIPIDEMIA 05/01/2009  . OBESITY 07/24/2007  . OBSTRUCTIVE SLEEP APNEA 07/24/2007    with CPAP compliance  . RESTLESS LEG SYNDROME 07/24/2007  . SYSTOLIC HEART FAILURE, CHRONIC 01/05/2009    a. chemo induced cardiomyopathy b. cMRI (02/2009) EF 45% c. Myoview 05/2010: EF 59%, no ischemia d. RHC (01/2012) RA 20, RV 55/13/22, PA 56/34 (44), PCWP 28, Fick CO/CI: 3.1 / 1.5, PVR 5.3 WU, PA sat 42% & 48% e. ECHO (10/2012) EF 20-25%, diff HK, MV calcified annulus f. ECHO (03/2014) EF 30-35%, sev HK, inferior/inferoseptal walls, mild MR  . 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 Hypertension   . History of breast cancer 2006    with return in  2008, s/p mastectomy, XRT and chemotherapy  . Osteoarthritis   . Anxiety   . Depression     followed by a psychiatrist  . Asthma   . GERD (gastroesophageal reflux disease)   . Gastroparesis   . IBS (irritable bowel syndrome)   . Myocardial infarction ([place])   . ICD (implantable cardiac defibrillator) in place 11/10/2012    ICD (11/2012)  . COPD (chronic obstructive pulmonary disease) ([place])   . Persistent atrial fibrillation ([place]) 09/2014  . AICD (automatic cardioverter/defibrillator) present   . Presence of permanent cardiac pacemaker   . CHF (congestive heart failure) [facility])    Past Surgical History  Procedure Laterality Date  . Mastectomy      right  . Abdominal hysterectomy  1991  . Cardiac defibrillator placement  11/10/2012    SJM Fortify Assura VR implanted by Dr Rayann Heman for primary prevention  . Cholecystectomy    . Wrist surgery Bilateral   . Knee cartilage surgery    . Bi-ventricular implantable cardioverter defibrillator N/A 11/10/2012    SJM Forify Assura single chamber ICD  . Cardiac catheterization N/A 08/21/2015    Procedure: Right/Left Heart Cath and Coronary Angiography;  Surgeon: Jolaine Artist, MD;  Location: [place] [facility];  Service: Cardiovascular;  Laterality: N/A;  . Esophagogastroduodenoscopy N/A 11/16/2015    Procedure: ESOPHAGOGASTRODUODENOSCOPY (EGD);  Surgeon: Manus Gunning, MD;  Location: [place];  Service: Gastroenterology;  Laterality: N/A;  . Colonoscopy N/A 11/17/2015    Procedure: COLONOSCOPY;  Surgeon:  Jerene Bears, MD;  Location: [facility];  Service: Endoscopy;  Laterality: N/A;  . Givens capsule study N/A 12/11/2015    Procedure: GIVENS CAPSULE STUDY;  Surgeon: Manus Gunning, MD;  Location: [place];  Service: Gastroenterology;  Laterality: N/A;   Family History  Problem Relation Age of Onset  . Diabetes Mellitus II Mother   . Coronary artery disease Father     CABG x 3  . Breast cancer Cousin   . Breast cancer  Cousin   . Uterine cancer Cousin   . Lung cancer Mother   . Congestive Heart Failure Mother   . Hypertension Father   . Diabetes Mellitus II Father    Social History  Substance Use Topics  . Smoking status: Former Smoker    Quit date: 04/12/2001  . Smokeless tobacco: Never Used  . Alcohol Use: No   OB History    No data available     Review of Systems  Constitutional: Positive for fatigue. Negative for fever.  HENT: Negative for congestion.   Eyes: Negative for visual disturbance.  Respiratory: Negative for cough and shortness of breath.   Cardiovascular: Negative.   Gastrointestinal: Positive for constipation and blood in stool. Negative for nausea, vomiting, abdominal pain and diarrhea.  Genitourinary: Negative for dysuria.  Musculoskeletal: Negative for back pain and neck pain.  Skin: Negative for rash.  Neurological: Negative for headaches.      Allergies  Bee venom; Aspirin; Ciprofloxacin; Codeine; Naproxen; Other; Sulfonamide derivatives; Xarelto; Atorvastatin; Nsaids; and Tape  Home Medications   Prior to Admission medications   Medication Sig Start Date End Date Taking? Authorizing Provider  albuterol (PROVENTIL HFA) 108 (90 BASE) MCG/ACT inhaler Inhale 2 puffs into the lungs every 6 (six) hours as needed. For shortness of breath   Yes Historical Provider, MD  ALPRAZolam Duanne Moron) 0.5 MG tablet Take 0.5 mg by mouth daily as needed for anxiety.  11/06/15  Yes Historical Provider, MD  amiodarone (PACERONE) 200 MG tablet Take 1 tablet (200 mg total) by mouth daily. 09/05/15  Yes Amy D Clegg, NP  apixaban (ELIQUIS) 5 MG TABS tablet Take 1 tablet (5 mg total) by mouth 2 (two) times daily. 02/09/16  Yes Amy D Clegg, NP  busPIRone (BUSPAR) 30 MG tablet Take 30 mg by mouth 2 (two) times daily.    Yes Historical Provider, MD  carvedilol (COREG) 6.25 MG tablet Take 1 tablet (6.25 mg total) by mouth 2 (two) times daily with a meal. 11/18/15  Yes Mercy Riding, MD  digoxin (LANOXIN)  0.125 MG tablet Take 1 tablet (0.125 mg total) by mouth every other day. 06/28/15  Yes Jolaine Artist, MD  docusate sodium (COLACE) 100 MG capsule Take 3 capsules (300 mg total) by mouth at bedtime. 10/03/15  Yes Amy D Clegg, NP  DULoxetine (CYMBALTA) 30 MG capsule Take 30 mg by mouth daily.   Yes Amy A Moon, NP  furosemide (LASIX) 20 MG tablet Take 3 tablets (60 mg total) by mouth 2 (two) times daily. Patient taking differently: Take 60 mg by mouth 2 (two) times daily. If 1 to 2 pound weight gain in a day, take extra 20 mg dose 02/01/16  Yes Satira Mccallum Tillery, PA-C  Garlic [id number] MG CAPS Take 1 capsule by mouth daily.    Yes Historical Provider, MD  Glucosamine 500 MG CAPS Take 1 capsule by mouth daily.     Yes Historical Provider, MD  HUMALOG KWIKPEN 100 UNIT/ML KiwkPen Inject 7-34 Units  into the skin 3 (three) times daily. Sliding scale TID 10/30/15  Yes Historical Provider, MD  HUMALOG MIX 75/25 KWIKPEN (75-25) 100 UNIT/ML Kwikpen Inject 75 Units into the skin 2 (two) times daily. 11/21/15  Yes Historical Provider, MD  hydrOXYzine (ATARAX/VISTARIL) 50 MG tablet Take 50 mg by mouth every 6 (six) hours as needed for itching.   Yes Historical Provider, MD  ivabradine (CORLANOR) 5 MG TABS tablet Take 1 tablet (5 mg total) by mouth 2 (two) times daily with a meal. 02/09/16  Yes Amy D Clegg, NP  levothyroxine (SYNTHROID, LEVOTHROID) 75 MCG tablet Take 75 mcg by mouth daily before breakfast.    Yes Historical Provider, MD  Melatonin 1 MG TABS Take 0.5 mg by mouth at bedtime.   Yes Historical Provider, MD  metolazone (ZAROXOLYN) 2.5 MG tablet Take 2.5 mg by mouth as needed (for weight gain >3 lbs).   Yes Historical Provider, MD  mometasone (NASONEX) 50 MCG/ACT nasal spray Place 1 spray into both nostrils daily. allergies   Yes Historical Provider, MD  Multiple Minerals (CALCIUM-MAGNESIUM-ZINC) TABS Take 1 tablet by mouth daily.    Yes Historical Provider, MD  nitroGLYCERIN (NITROSTAT) 0.4 MG SL tablet  Place 1 tablet (0.4 mg total) under the tongue every 5 (five) minutes as needed. For chest pain 10/18/15  Yes Jolaine Artist, MD  pantoprazole (PROTONIX) 40 MG tablet Take 40 mg by mouth 2 (two) times daily.     Yes Historical Provider, MD  polyethylene glycol powder (GLYCOLAX/MIRALAX) powder Mix 17 grams in 8 oz of water twice daily 01/05/16  Yes Manus Gunning, MD  potassium chloride SA (K-DUR,KLOR-CON) 20 MEQ tablet Take 3 tablets (60 mEq total) by mouth 3 (three) times daily. With an additional 40 meq on Metolazone 02/01/16  Yes Shirley Friar, PA-C  pyridOXINE (VITAMIN B-6) 100 MG tablet Take 100 mg by mouth daily.   Yes Historical Provider, MD  ramipril (ALTACE) 2.5 MG capsule Take 1 capsule (2.5 mg total) by mouth daily. 02/01/16  Yes Shirley Friar, PA-C  rOPINIRole (REQUIP) 1 MG tablet Take 1 mg by mouth at bedtime.     Yes Historical Provider, MD  Simethicone Extra Strength 125 MG CAPS Take 1 tablet by mouth daily as needed (for bloating). Reported on 02/23/2016   Yes Historical Provider, MD  spironolactone (ALDACTONE) 25 MG tablet Take 25 mg by mouth daily.   Yes Historical Provider, MD  SUMAtriptan (IMITREX) 100 MG tablet Take 1 tablet by mouth daily as needed for migraine.  11/28/15  Yes Historical Provider, MD  tiotropium (SPIRIVA) 18 MCG inhalation capsule Place 18 mcg into inhaler and inhale daily.     Yes Historical Provider, MD  vitamin B-12 (CYANOCOBALAMIN) 1000 MCG tablet Take 1,000 mcg by mouth daily.   Yes Historical Provider, MD  vitamin C (ASCORBIC ACID) 500 MG tablet Take 500 mg by mouth daily.   Yes Historical Provider, MD  vitamin E 100 UNIT capsule Take 400 Units by mouth daily.   Yes Historical Provider, MD   BP 100/59 mmHg  Pulse 95  Temp(Src) 98 F (36.7 C) (Oral)  Resp 23  Ht 5\' 5"  (1.651 m)  Wt 96.616 kg  BMI 35.44 kg/m2  SpO2 98% Physical Exam  Constitutional: She is oriented to person, place, and time. She appears well-developed and  well-nourished. No distress.  HENT:  Head: Normocephalic and atraumatic.  Cardiovascular: Normal rate, regular rhythm, normal heart sounds and intact distal pulses.  Exam reveals no gallop  and no friction rub.   No murmur heard. Pulmonary/Chest: Effort normal and breath sounds normal. No respiratory distress. She has no wheezes. She has no rales. She exhibits no tenderness.  Abdominal: Soft. Bowel sounds are normal. She exhibits no distension. There is tenderness (Mild generalized).  Musculoskeletal: She exhibits no edema.  Neurological: She is alert and oriented to person, place, and time.  Skin: Skin is warm and dry. There is pallor.  Nursing note and vitals reviewed.   ED Course  Procedures (including critical care time)  CRITICAL CARE Performed by: Ozella Almond Legend Pecore   Total critical care time: 35 minutes  Critical care time was exclusive of separately billable procedures and treating other patients.  Critical care was necessary to treat or prevent imminent or life-threatening deterioration.  Critical care was time spent personally by me on the following activities: development of treatment plan with patient and/or surrogate as well as nursing, discussions with consultants, evaluation of patient's response to treatment, examination of patient, obtaining history from patient or surrogate, ordering and performing treatments and interventions, ordering and review of laboratory studies, ordering and review of radiographic studies, pulse oximetry and re-evaluation of patient's condition.  Labs Review Labs Reviewed  COMPREHENSIVE METABOLIC PANEL - Abnormal; Notable for the following:    Sodium 133 (*)    Potassium 3.3 (*)    Chloride 96 (*)    Glucose, Bld 180 (*)    BUN 22 (*)    Creatinine, Ser 1.16 (*)    Albumin 3.2 (*)    GFR calc non Af Amer 50 (*)    GFR calc Af Amer 58 (*)    All other components within normal limits  CBC - Abnormal; Notable for the following:    RBC  2.98 (*)    Hemoglobin 6.7 (*)    HCT 22.9 (*)    MCV 76.8 (*)    MCH 22.5 (*)    MCHC 29.3 (*)    RDW 18.3 (*)    All other components within normal limits  IRON AND TIBC - Abnormal; Notable for the following:    Iron 18 (*)    TIBC 512 (*)    Saturation Ratios 4 (*)    All other components within normal limits  FERRITIN - Abnormal; Notable for the following:    Ferritin 3 (*)    All other components within normal limits  RETICULOCYTES - Abnormal; Notable for the following:    RBC. 3.16 (*)    All other components within normal limits  POC OCCULT BLOOD, ED - Abnormal; Notable for the following:    Fecal Occult Bld POSITIVE (*)    All other components within normal limits  VITAMIN B12  FOLATE  TYPE AND SCREEN  PREPARE RBC (CROSSMATCH)    Imaging Review No results found. I have personally reviewed and evaluated these images and lab results as part of my medical decision-making.   EKG Interpretation None      MDM   Final diagnoses:  Acute GI bleeding   Lauraine Rinne Feeley presents with fatigue x 1 week and dark stools x 1 day. Sxs similar to prior GI bleed in April 2017. Patient is on Eliquis, last dose last night. On exam, patient is pale with stable but soft BP.  H&H of 6.7/22.9 - Rectal exam performed by me, stool dark - hemoccult +. Anemia panel obtained. 1 unit of blood transfused in ED.   Therapeutics: Protonix.   Consults: GI, Dr. Ardis Hughs - GI will  see patient in consult. Hospitalist, Dr. Karleen Hampshire who will admit.   Patient seen by and discussed with Dr. Reather Converse who agrees with treatment plan.    [facility] Janoah Menna, PA-C 02/23/16 1905  Elnora Morrison, MD 02/24/16 [phone]

## 2016-02-24 ENCOUNTER — Encounter (HOSPITAL_COMMUNITY)
Admission: EM | Disposition: A | Discharge status: Home / Self Care | Payer: Self-pay | Source: Home / Self Care | Attending: Internal Medicine

## 2016-02-24 ENCOUNTER — Inpatient Hospital Stay (HOSPITAL_COMMUNITY): Payer: Medicare Other | Admitting: Anesthesiology

## 2016-02-24 ENCOUNTER — Encounter (HOSPITAL_COMMUNITY): Payer: Self-pay

## 2016-02-24 DIAGNOSIS — D500 Iron deficiency anemia secondary to blood loss (chronic): Secondary | ICD-10-CM

## 2016-02-24 DIAGNOSIS — K922 Gastrointestinal hemorrhage, unspecified: Secondary | ICD-10-CM

## 2016-02-24 DIAGNOSIS — K921 Melena: Secondary | ICD-10-CM

## 2016-02-24 DIAGNOSIS — I480 Paroxysmal atrial fibrillation: Secondary | ICD-10-CM

## 2016-02-24 DIAGNOSIS — K31811 Angiodysplasia of stomach and duodenum with bleeding: Principal | ICD-10-CM

## 2016-02-24 DIAGNOSIS — I5022 Chronic systolic (congestive) heart failure: Secondary | ICD-10-CM

## 2016-02-24 HISTORY — PX: ESOPHAGOGASTRODUODENOSCOPY: SHX5428

## 2016-02-24 LAB — HEMOGLOBIN AND HEMATOCRIT, BLOOD
HCT: 25.6 % — ABNORMAL LOW (ref 36.0–46.0)
Hemoglobin: 7.5 g/dL — ABNORMAL LOW (ref 12.0–15.0)

## 2016-02-24 LAB — GLUCOSE, CAPILLARY
GLUCOSE-CAPILLARY: 177 mg/dL — AB (ref 65–99)
GLUCOSE-CAPILLARY: 234 mg/dL — AB (ref 65–99)
GLUCOSE-CAPILLARY: 149 mg/dL — AB (ref 65–99)
Glucose-Capillary: 132 mg/dL — ABNORMAL HIGH (ref 65–99)

## 2016-02-24 LAB — BASIC METABOLIC PANEL
ANION GAP: 8 (ref 5–15)
BUN: 17 mg/dL (ref 6–20)
CHLORIDE: 99 mmol/L — AB (ref 101–111)
CO2: 31 mmol/L (ref 22–32)
Calcium: 9.3 mg/dL (ref 8.9–10.3)
Creatinine, Ser: 1.04 mg/dL — ABNORMAL HIGH (ref 0.44–1.00)
GFR calc non Af Amer: 57 mL/min — ABNORMAL LOW (ref >60)
GLUCOSE: 137 mg/dL — AB (ref 65–99)
Potassium: 3.7 mmol/L (ref 3.5–5.1)
Sodium: 138 mmol/L (ref 135–145)

## 2016-02-24 LAB — CBC
HCT: 25.1 % — ABNORMAL LOW (ref 36.0–46.0)
Hemoglobin: 7.3 g/dL — ABNORMAL LOW (ref 12.0–15.0)
MCH: 22.9 pg — ABNORMAL LOW (ref 26.0–34.0)
MCHC: 29.1 g/dL — AB (ref 30.0–36.0)
MCV: 78.7 fL (ref 78.0–100.0)
PLATELETS: 339 10*3/uL (ref 150–400)
RBC: 3.19 MIL/uL — ABNORMAL LOW (ref 3.87–5.11)
RDW: 17.4 % — AB (ref 11.5–15.5)
WBC: 10.5 10*3/uL (ref 4.0–10.5)

## 2016-02-24 SURGERY — EGD (ESOPHAGOGASTRODUODENOSCOPY)
Anesthesia: Monitor Anesthesia Care

## 2016-02-24 MED ORDER — INSULIN ASPART 100 UNIT/ML ~~LOC~~ SOLN
0.0000 [IU] | Freq: Three times a day (TID) | SUBCUTANEOUS | Status: DC
  Administered 2016-02-24: 3 [IU] via SUBCUTANEOUS

## 2016-02-24 MED ORDER — PROPOFOL 10 MG/ML IV BOLUS
INTRAVENOUS | Status: DC | PRN
  Administered 2016-02-24: 15 mg via INTRAVENOUS
  Administered 2016-02-24 (×3): 5 mg via INTRAVENOUS
  Administered 2016-02-24: 10 mg via INTRAVENOUS

## 2016-02-24 MED ORDER — POTASSIUM CHLORIDE CRYS ER 20 MEQ PO TBCR
60.0000 meq | EXTENDED_RELEASE_TABLET | Freq: Three times a day (TID) | ORAL | Status: DC
  Administered 2016-02-25: 60 meq via ORAL
  Filled 2016-02-24: qty 3

## 2016-02-24 MED ORDER — PANTOPRAZOLE SODIUM 40 MG PO TBEC: 40.0000 mg | DELAYED_RELEASE_TABLET | Freq: Every day | ORAL | Status: DC

## 2016-02-24 MED ORDER — SODIUM CHLORIDE 0.9 % IV SOLN
510.0000 mg | Freq: Once | INTRAVENOUS | Status: AC
  Administered 2016-02-24: 510 mg via INTRAVENOUS
  Filled 2016-02-24 (×2): qty 17

## 2016-02-24 MED ORDER — DOCUSATE SODIUM 100 MG PO CAPS
300.0000 mg | ORAL_CAPSULE | Freq: Every day | ORAL | Status: DC
  Administered 2016-02-24: 300 mg via ORAL
  Filled 2016-02-24: qty 3

## 2016-02-24 MED ORDER — LACTATED RINGERS IV SOLN
INTRAVENOUS | Status: DC | PRN
  Administered 2016-02-24: 10:00:00 via INTRAVENOUS

## 2016-02-24 MED ORDER — INSULIN ASPART 100 UNIT/ML ~~LOC~~ SOLN
0.0000 [IU] | Freq: Three times a day (TID) | SUBCUTANEOUS | Status: DC
  Administered 2016-02-25: 4 [IU] via SUBCUTANEOUS
  Administered 2016-02-25: 7 [IU] via SUBCUTANEOUS

## 2016-02-24 MED ORDER — HYDROXYZINE HCL 25 MG PO TABS: 50.0000 mg | ORAL_TABLET | Freq: Four times a day (QID) | ORAL | Status: DC | PRN

## 2016-02-24 MED ORDER — INSULIN ASPART 100 UNIT/ML ~~LOC~~ SOLN: 0.0000 [IU] | Freq: Every day | SUBCUTANEOUS | Status: DC

## 2016-02-24 MED ORDER — ALBUTEROL SULFATE (2.5 MG/3ML) 0.083% IN NEBU: 2.5000 mg | INHALATION_SOLUTION | Freq: Four times a day (QID) | RESPIRATORY_TRACT | Status: DC | PRN

## 2016-02-24 MED ORDER — SPIRONOLACTONE 25 MG PO TABS
25.0000 mg | ORAL_TABLET | Freq: Every day | ORAL | Status: DC
  Administered 2016-02-25: 25 mg via ORAL
  Filled 2016-02-24: qty 1

## 2016-02-24 MED ORDER — IVABRADINE HCL 5 MG PO TABS
5.0000 mg | ORAL_TABLET | Freq: Two times a day (BID) | ORAL | Status: DC
  Administered 2016-02-25: 5 mg via ORAL
  Filled 2016-02-24 (×2): qty 1

## 2016-02-24 MED ORDER — GLUCAGON HCL RDNA (DIAGNOSTIC) 1 MG IJ SOLR
INTRAMUSCULAR | Status: DC | PRN
  Administered 2016-02-24: .5 mg via INTRAVENOUS

## 2016-02-24 MED ORDER — CARVEDILOL 6.25 MG PO TABS: 6.2500 mg | ORAL_TABLET | Freq: Two times a day (BID) | ORAL | Status: DC

## 2016-02-24 MED ORDER — POTASSIUM CHLORIDE CRYS ER 20 MEQ PO TBCR
40.0000 meq | EXTENDED_RELEASE_TABLET | Freq: Once | ORAL | Status: AC
  Administered 2016-02-24: 40 meq via ORAL
  Filled 2016-02-24: qty 2

## 2016-02-24 MED ORDER — PANTOPRAZOLE SODIUM 40 MG PO TBEC
40.0000 mg | DELAYED_RELEASE_TABLET | Freq: Two times a day (BID) | ORAL | Status: DC
  Administered 2016-02-24 – 2016-02-25 (×2): 40 mg via ORAL
  Filled 2016-02-24 (×2): qty 1

## 2016-02-24 MED ORDER — FENTANYL CITRATE (PF) 100 MCG/2ML IJ SOLN
INTRAMUSCULAR | Status: DC | PRN
  Administered 2016-02-24 (×2): 25 ug via INTRAVENOUS

## 2016-02-24 MED ORDER — FUROSEMIDE 40 MG PO TABS
60.0000 mg | ORAL_TABLET | Freq: Two times a day (BID) | ORAL | Status: DC
  Administered 2016-02-25: 60 mg via ORAL
  Filled 2016-02-24: qty 2

## 2016-02-24 MED ORDER — CARVEDILOL 6.25 MG PO TABS
6.2500 mg | ORAL_TABLET | Freq: Two times a day (BID) | ORAL | Status: DC
  Administered 2016-02-25: 6.25 mg via ORAL
  Filled 2016-02-24: qty 1

## 2016-02-24 MED ORDER — LEVOTHYROXINE SODIUM 75 MCG PO TABS
75.0000 ug | ORAL_TABLET | Freq: Every day | ORAL | Status: DC
  Administered 2016-02-25: 75 ug via ORAL
  Filled 2016-02-24: qty 1

## 2016-02-24 MED ORDER — CARVEDILOL 6.25 MG PO TABS
6.2500 mg | ORAL_TABLET | Freq: Two times a day (BID) | ORAL | Status: DC
  Administered 2016-02-24: 6.25 mg via ORAL
  Filled 2016-02-24: qty 1

## 2016-02-24 NOTE — Anesthesia Postprocedure Evaluation (Signed)
Anesthesia Post Note  Patient: TANVIKA ADDEO  Procedure(s) Performed: Procedure(s) (LRB): ESOPHAGOGASTRODUODENOSCOPY (EGD) (N/A)  Patient location during evaluation: Endoscopy Anesthesia Type: MAC Level of consciousness: awake and alert Pain management: pain level controlled Vital Signs Assessment: post-procedure vital signs reviewed and stable Respiratory status: spontaneous breathing, nonlabored ventilation, respiratory function stable and patient connected to nasal cannula oxygen Cardiovascular status: stable and blood pressure returned to baseline Anesthetic complications: no    Last Vitals:  Filed Vitals:   02/24/16 1225 02/24/16 1228  BP: 102/68   Pulse: 111   Temp:  36.6 C  Resp: 22     Last Pain: There were no vitals filed for this visit.               Aristotle Lieb,JAMES TERRILL

## 2016-02-24 NOTE — Interval H&P Note (Signed)
History and Physical Interval Note:  02/24/2016 9:55 AM  [name]  has presented today for surgery, with the diagnosis of melena  The various methods of treatment have been discussed with the patient and family. After consideration of risks, benefits and other options for treatment, the patient has consented to  Procedure(s): ESOPHAGOGASTRODUODENOSCOPY (EGD) (N/A) as a surgical intervention .  The patient's history has been reviewed, patient examined, no change in status, stable for surgery.  I have reviewed the patient's chart and labs.  Questions were answered to the patient's satisfaction.     Milus Banister

## 2016-02-24 NOTE — Consult Note (Signed)
[facility] Referring Provider: [facility], Dr. Karleen Hampshire Primary Care Physician:  Nicoletta Dress, MD Primary Gastroenterologist:  Dr. Havery Moros  Reason for Consultation: Recurrent GI bleeding   HPI:  [name] is a 60 y.o. female on eliquis for afib with recurrent GI bleeding. She has severe CHF with 08/2015 EF 30-35%, chemotherapy induced, and has and AICD placed (since 2014).  She had extensive GI testing in past 2-3 months for Heme + anemia:  EGD 11/16/15 Dr. Havery Moros- Esophagogastric landmarks identified. Normal esophagus- Multiple gastric polyps, suspect benign fundic gland polyps.- Two duodenal polyps, could be adenomatous, not removed on this exam due to potential need for capsule study. These will need to be removed at a later date. Overall, no clear pathology noted to cause th patient's melena and anemia. 11/17/15 Colonoscopy Dr. Hilarie Fredrickson - One 5 mm polyp (path tubular adenoma) in the proximal descending colon, removed with a cold snare. Resected and retrieved.- Moderate diverticulosis in the left colon- Melanosis in the colon.The distal rectum and anal verge are normal on retroflexion view. 12/2015 Capsule endoscopy- gastric polyps, duodenal polyps "there is a very small amount of blood oozing in lumen in the suspected duodenum but no identifiably actively bleeding source noted", "one tiny AVM at 4 hours, 29 minutes"....enteroscopy with polypectomy of the duodenal polyps is the likely next step in this evaluation.    She had been on xarelto since Jan 2017, this was changed to eliquis April 2017.  The enteroscopy was scheduled for 03/2016 however she recently had blood counts checked by PCP because of fatigue and found to have Hb 7.  Baseline has been 9.8 to 10.4 in past 2 months.  She was sent to the ER.  She's had melena recently (one episode 2 nights ago, no MB since then, no hematemesis.  Her Hb was 6.7, BUN was 22, Cr was 1.2.  She was admitted to the  hospital, put on IV PPI twice daily. She received one unit blood transfusion overnight. Her last dose of eliquis was 2 days ago.  Past Medical History  Diagnosis Date  . 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 DYSLIPIDEMIA 05/01/2009  . OBESITY 07/24/2007  . OBSTRUCTIVE SLEEP APNEA 07/24/2007    with CPAP compliance  . RESTLESS LEG SYNDROME 07/24/2007  . SYSTOLIC HEART FAILURE, CHRONIC 01/05/2009    a. chemo induced cardiomyopathy b. cMRI (02/2009) EF 45% c. Myoview 05/2010: EF 59%, no ischemia d. RHC (01/2012) RA 20, RV 55/13/22, PA 56/34 (44), PCWP 28, Fick CO/CI: 3.1 / 1.5, PVR 5.3 WU, PA sat 42% & 48% e. ECHO (10/2012) EF 20-25%, diff HK, MV calcified annulus f. ECHO (03/2014) EF 30-35%, sev HK, inferior/inferoseptal walls, mild MR  . 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 Hypertension   . History of breast cancer 2006    with return in 2008, s/p mastectomy, XRT and chemotherapy  . Osteoarthritis   . Anxiety   . Depression     followed by a psychiatrist  . Asthma   . GERD (gastroesophageal reflux disease)   . Gastroparesis   . IBS (irritable bowel syndrome)   . Myocardial infarction ([place])   . ICD (implantable cardiac defibrillator) in place 11/10/2012    ICD (11/2012)  . COPD (chronic obstructive pulmonary disease) ([place])   . Persistent atrial fibrillation ([place]) 09/2014  . AICD (automatic cardioverter/defibrillator) present   . Presence of permanent cardiac pacemaker   . CHF (congestive heart failure) [facility])     Past Surgical History  Procedure Laterality Date  . Mastectomy  right  . Abdominal hysterectomy  1991  . Cardiac defibrillator placement  11/10/2012    SJM Fortify Assura VR implanted by Dr Rayann Heman for primary prevention  . Cholecystectomy    . Wrist surgery Bilateral   . Knee cartilage surgery    . Bi-ventricular implantable cardioverter defibrillator N/A 11/10/2012    SJM Forify Assura single chamber ICD  . Cardiac catheterization N/A 08/21/2015    Procedure: Right/Left Heart Cath and  Coronary Angiography;  Surgeon: Jolaine Artist, MD;  Location: [facility];  Service: Cardiovascular;  Laterality: N/A;  . Esophagogastroduodenoscopy N/A 11/16/2015    Procedure: ESOPHAGOGASTRODUODENOSCOPY (EGD);  Surgeon: Manus Gunning, MD;  Location: [facility];  Service: Gastroenterology;  Laterality: N/A;  . Colonoscopy N/A 11/17/2015    Procedure: COLONOSCOPY;  Surgeon: Jerene Bears, MD;  Location: [facility] END[facility]COPY;  Service: Endoscopy;  Laterality: N/A;  . Givens capsule study N/A 12/11/2015    Procedure: GIVENS CAPSULE STUDY;  Surgeon: Manus Gunning, MD;  Location: [facility];  Service: Gastroenterology;  Laterality: N/A;    Prior to Admission medications   Medication Sig Start Date End Date Taking? Authorizing Provider  albuterol (PROVENTIL HFA) 108 (90 BASE) MCG/ACT inhaler Inhale 2 puffs into the lungs every 6 (six) hours as needed. For shortness of breath   Yes Historical Provider, MD  ALPRAZolam Duanne Moron) 0.5 MG tablet Take 0.5 mg by mouth daily as needed for anxiety.  11/06/15  Yes Historical Provider, MD  amiodarone (PACERONE) 200 MG tablet Take 1 tablet (200 mg total) by mouth daily. 09/05/15  Yes Amy D Clegg, NP  apixaban (ELIQUIS) 5 MG TABS tablet Take 1 tablet (5 mg total) by mouth 2 (two) times daily. 02/09/16  Yes Amy D Clegg, NP  busPIRone (BUSPAR) 30 MG tablet Take 30 mg by mouth 2 (two) times daily.    Yes Historical Provider, MD  carvedilol (COREG) 6.25 MG tablet Take 1 tablet (6.25 mg total) by mouth 2 (two) times daily with a meal. 11/18/15  Yes Mercy Riding, MD  digoxin (LANOXIN) 0.125 MG tablet Take 1 tablet (0.125 mg total) by mouth every other day. 06/28/15  Yes Jolaine Artist, MD  docusate sodium (COLACE) 100 MG capsule Take 3 capsules (300 mg total) by mouth at bedtime. 10/03/15  Yes Amy D Clegg, NP  DULoxetine (CYMBALTA) 30 MG capsule Take 30 mg by mouth daily.   Yes Amy A Moon, NP  furosemide (LASIX) 20 MG tablet Take 3 tablets (60 mg total)  by mouth 2 (two) times daily. Patient taking differently: Take 60 mg by mouth 2 (two) times daily. If 1 to 2 pound weight gain in a day, take extra 20 mg dose 02/01/16  Yes Satira Mccallum Tillery, PA-C  Garlic [id number] MG CAPS Take 1 capsule by mouth daily.    Yes Historical Provider, MD  Glucosamine 500 MG CAPS Take 1 capsule by mouth daily.     Yes Historical Provider, MD  HUMALOG KWIKPEN 100 UNIT/ML KiwkPen Inject 7-34 Units into the skin 3 (three) times daily. Sliding scale TID 10/30/15  Yes Historical Provider, MD  HUMALOG MIX 75/25 KWIKPEN (75-25) 100 UNIT/ML Kwikpen Inject 75 Units into the skin 2 (two) times daily. 11/21/15  Yes Historical Provider, MD  hydrOXYzine (ATARAX/VISTARIL) 50 MG tablet Take 50 mg by mouth every 6 (six) hours as needed for itching.   Yes Historical Provider, MD  ivabradine (CORLANOR) 5 MG TABS tablet Take 1 tablet (5 mg total) by mouth 2 (two)  times daily with a meal. 02/09/16  Yes Amy D Clegg, NP  levothyroxine (SYNTHROID, LEVOTHROID) 75 MCG tablet Take 75 mcg by mouth daily before breakfast.    Yes Historical Provider, MD  Melatonin 1 MG TABS Take 0.5 mg by mouth at bedtime.   Yes Historical Provider, MD  metolazone (ZAROXOLYN) 2.5 MG tablet Take 2.5 mg by mouth as needed (for weight gain >3 lbs).   Yes Historical Provider, MD  mometasone (NASONEX) 50 MCG/ACT nasal spray Place 1 spray into both nostrils daily. allergies   Yes Historical Provider, MD  Multiple Minerals (CALCIUM-MAGNESIUM-ZINC) TABS Take 1 tablet by mouth daily.    Yes Historical Provider, MD  nitroGLYCERIN (NITROSTAT) 0.4 MG SL tablet Place 1 tablet (0.4 mg total) under the tongue every 5 (five) minutes as needed. For chest pain 10/18/15  Yes Jolaine Artist, MD  pantoprazole (PROTONIX) 40 MG tablet Take 40 mg by mouth 2 (two) times daily.     Yes Historical Provider, MD  polyethylene glycol powder (GLYCOLAX/MIRALAX) powder Mix 17 grams in 8 oz of water twice daily 01/05/16  Yes Manus Gunning, MD   potassium chloride SA (K-DUR,KLOR-CON) 20 MEQ tablet Take 3 tablets (60 mEq total) by mouth 3 (three) times daily. With an additional 40 meq on Metolazone 02/01/16  Yes Shirley Friar, PA-C  pyridOXINE (VITAMIN B-6) 100 MG tablet Take 100 mg by mouth daily.   Yes Historical Provider, MD  ramipril (ALTACE) 2.5 MG capsule Take 1 capsule (2.5 mg total) by mouth daily. 02/01/16  Yes Shirley Friar, PA-C  rOPINIRole (REQUIP) 1 MG tablet Take 1 mg by mouth at bedtime.     Yes Historical Provider, MD  Simethicone Extra Strength 125 MG CAPS Take 1 tablet by mouth daily as needed (for bloating). Reported on 02/23/2016   Yes Historical Provider, MD  spironolactone (ALDACTONE) 25 MG tablet Take 25 mg by mouth daily.   Yes Historical Provider, MD  SUMAtriptan (IMITREX) 100 MG tablet Take 1 tablet by mouth daily as needed for migraine.  11/28/15  Yes Historical Provider, MD  tiotropium (SPIRIVA) 18 MCG inhalation capsule Place 18 mcg into inhaler and inhale daily.     Yes Historical Provider, MD  vitamin B-12 (CYANOCOBALAMIN) 1000 MCG tablet Take 1,000 mcg by mouth daily.   Yes Historical Provider, MD  vitamin C (ASCORBIC ACID) 500 MG tablet Take 500 mg by mouth daily.   Yes Historical Provider, MD  vitamin E 100 UNIT capsule Take 400 Units by mouth daily.   Yes Historical Provider, MD    Current Facility-Administered Medications  Medication Dose Route Frequency Provider Last Rate Last Dose  . acetaminophen (TYLENOL) tablet 650 mg  650 mg Oral Q6H PRN Hosie Poisson, MD       Or  . acetaminophen (TYLENOL) suppository 650 mg  650 mg Rectal Q6H PRN Hosie Poisson, MD      . albuterol (PROVENTIL) (2.5 MG/3ML) 0.083% nebulizer solution 2.5 mg  2.5 mg Inhalation Q6H PRN Hosie Poisson, MD      . ALPRAZolam Duanne Moron) tablet 0.5 mg  0.5 mg Oral Daily PRN Hosie Poisson, MD      . amiodarone (PACERONE) tablet 200 mg  200 mg Oral Daily Hosie Poisson, MD   200 mg at 02/23/16 2235  . busPIRone (BUSPAR) tablet 30 mg   30 mg Oral BID Hosie Poisson, MD   30 mg at 02/23/16 2235  . digoxin (LANOXIN) tablet 0.125 mg  0.125 mg Oral QODAY Hosie Poisson, MD      .  DULoxetine (CYMBALTA) DR capsule 30 mg  30 mg Oral Daily Hosie Poisson, MD   30 mg at 02/23/16 2235  . fluticasone (FLONASE) 50 MCG/ACT nasal spray 2 spray  2 spray Each Nare Daily Hosie Poisson, MD      . insulin aspart (novoLOG) injection 0-9 Units  0-9 Units Subcutaneous TID WC Cherene Altes, MD      . insulin aspart protamine- aspart (NOVOLOG MIX 70/30) injection 75 Units  75 Units Subcutaneous BID Hosie Poisson, MD   75 Units at 02/23/16 2200  . ivabradine (CORLANOR) tablet 5 mg  5 mg Oral BID WC Hosie Poisson, MD   5 mg at 02/24/16 [id number]  . levothyroxine (SYNTHROID, LEVOTHROID) tablet 75 mcg  75 mcg Oral QAC breakfast Hosie Poisson, MD   75 mcg at 02/24/16 [id number]  . ondansetron (ZOFRAN) tablet 4 mg  4 mg Oral Q6H PRN Hosie Poisson, MD       Or  . ondansetron (ZOFRAN) injection 4 mg  4 mg Intravenous Q6H PRN Hosie Poisson, MD      . pantoprazole (PROTONIX) injection 40 mg  40 mg Intravenous Q12H Elnora Morrison, MD   40 mg at 02/23/16 1635  . rOPINIRole (REQUIP) tablet 1 mg  1 mg Oral QHS Hosie Poisson, MD   1 mg at 02/23/16 2235  . sodium chloride flush (NS) 0.9 % injection 3 mL  3 mL Intravenous Q12H Hosie Poisson, MD   3 mL at 02/23/16 2200  . SUMAtriptan (IMITREX) tablet 100 mg  100 mg Oral Daily PRN Hosie Poisson, MD      . tiotropium (SPIRIVA) inhalation capsule 18 mcg  18 mcg Inhalation Daily Hosie Poisson, MD   18 mcg at 02/23/16 2053    Allergies as of 02/23/2016 - Review Complete 02/23/2016  Allergen Reaction Noted  . Bee venom Anaphylaxis 03/16/2014  . Aspirin Hives   . Ciprofloxacin Hives   . Codeine Nausea Only   . Naproxen Hives   . Other Other (See Comments) 08/19/2015  . Sulfonamide derivatives Hives   . Xarelto [rivaroxaban] Hives 12/11/2015  . Atorvastatin Itching 02/23/2016  . Nsaids Itching and Rash 12/25/2014  . Tape Rash 08/11/2011     Family History  Problem Relation Age of Onset  . Diabetes Mellitus II Mother   . Coronary artery disease Father     CABG x 3  . Breast cancer Cousin   . Breast cancer Cousin   . Uterine cancer Cousin   . Lung cancer Mother   . Congestive Heart Failure Mother   . Hypertension Father   . Diabetes Mellitus II Father     Social History   Social History  . Marital Status: Divorced    Spouse Name: N/A  . Number of Children: N/A  . Years of Education: N/A   Occupational History  . Disabled    Social History Main Topics  . Smoking status: Former Smoker    Quit date: 04/12/2001  . Smokeless tobacco: Never Used  . Alcohol Use: No  . Drug Use: No  . Sexual Activity: Not Currently   Other Topics Concern  . Not on file   Social History Narrative   Lives with mother and brother Philis Nettle          Review of Systems: Pertinent positive and negative review of systems were noted in the above HPI section. Complete review of systems was performed and was otherwise normal.   Physical Exam: Vital signs in last 24 hours: Temp:  [  97.8 F (36.6 C)-98.8 F (37.1 C)] 98.5 F (36.9 C) (07/22 0400) Pulse Rate:  [87-100] 87 (07/22 0600) Resp:  [13-28] 20 (07/22 0600) BP: (88-125)/(39-73) 98/50 mmHg (07/22 0600) SpO2:  [91 %-100 %] 95 % (07/22 0600) Weight:  [213 lb (96.616 kg)] 213 lb (96.616 kg) (07/21 2017) Last BM Date: 02/23/16 Constitutional: generally well-appearing Psychiatric: alert and oriented x3 Eyes: extraocular movements intact Mouth: oral pharynx moist, no lesions Neck: supple no lymphadenopathy Cardiovascular: heart regular rate and rhythm Lungs: clear to auscultation bilaterally Abdomen: soft, nontender, nondistended, no obvious ascites, no peritoneal signs, normal bowel sounds Extremities: no lower extremity edema bilaterally Skin: no lesions on visible extremities   Lab Results:  Recent Labs  02/23/16 1450 02/23/16 2225 02/24/16 0458  WBC 9.5   --   --   HGB 6.7* 7.2* 7.5*  HCT 22.9* 24.9* 25.6*  PLT 375  --   --   MCV 76.8*  --   --    BMET  Recent Labs  02/23/16 1450 02/24/16 0458  NA 133* 138  K 3.3* 3.7  CL 96* 99*  CO2 28 31  GLUCOSE 180* 137*  BUN 22* 17  CREATININE 1.16* 1.04*  CALCIUM 9.2 9.3   LFT  Recent Labs  02/23/16 1450  BILITOT 0.3  AST 20  ALT 16  ALKPHOS 63  PROT 6.9  ALBUMIN 3.2*    Impression/Plan: 60 y.o. female with recurrent GI bleeding (overt, obscure)  She had extensive GI testing 2-3 months ago for this without clear source, the GI plan was to proceed with small bowel enteroscopy for another examination and at same time to remove duodenal polyps that was possibly oozing at time of small bowel capsule 2 months ago.  I am certain that her blood thinner contributes to the pace, volume of her GI bleeding.  It is currently on hold. She is not in afib, perhaps it does NOT need to be restarted?  Her Hb has risen from 6.7 to 7.5 after 1 unit blood transfusion. Will plan for enteroscopy today.  Milus Banister, MD  02/24/2016, 7:06 AM [facility] Pager [phone]

## 2016-02-24 NOTE — H&P (View-Only) (Signed)
[facility] Referring Provider: [facility], Dr. Karleen Hampshire Primary Care Physician:  Nicoletta Dress, MD Primary Gastroenterologist:  Dr. Havery Moros  Reason for Consultation: Recurrent GI bleeding   HPI:  [name] is a 60 y.o. female on eliquis for afib with recurrent GI bleeding. She has severe CHF with 08/2015 EF 30-35%, chemotherapy induced, and has and AICD placed (since 2014).  She had extensive GI testing in past 2-3 months for Heme + anemia:  EGD 11/16/15 Dr. Havery Moros- Esophagogastric landmarks identified. Normal esophagus- Multiple gastric polyps, suspect benign fundic gland polyps.- Two duodenal polyps, could be adenomatous, not removed on this exam due to potential need for capsule study. These will need to be removed at a later date. Overall, no clear pathology noted to cause th patient's melena and anemia. 11/17/15 Colonoscopy Dr. Hilarie Fredrickson - One 5 mm polyp (path tubular adenoma) in the proximal descending colon, removed with a cold snare. Resected and retrieved.- Moderate diverticulosis in the left colon- Melanosis in the colon.The distal rectum and anal verge are normal on retroflexion view. 12/2015 Capsule endoscopy- gastric polyps, duodenal polyps "there is a very small amount of blood oozing in lumen in the suspected duodenum but no identifiably actively bleeding source noted", "one tiny AVM at 4 hours, 29 minutes"....enteroscopy with polypectomy of the duodenal polyps is the likely next step in this evaluation.    She had been on xarelto since Jan 2017, this was changed to eliquis April 2017.  The enteroscopy was scheduled for 03/2016 however she recently had blood counts checked by PCP because of fatigue and found to have Hb 7.  Baseline has been 9.8 to 10.4 in past 2 months.  She was sent to the ER.  She's had melena recently (one episode 2 nights ago, no MB since then, no hematemesis.  Her Hb was 6.7, BUN was 22, Cr was 1.2.  She was admitted to the  hospital, put on IV PPI twice daily. She received one unit blood transfusion overnight. Her last dose of eliquis was 2 days ago.  Past Medical History  Diagnosis Date  . 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 DYSLIPIDEMIA 05/01/2009  . OBESITY 07/24/2007  . OBSTRUCTIVE SLEEP APNEA 07/24/2007    with CPAP compliance  . RESTLESS LEG SYNDROME 07/24/2007  . SYSTOLIC HEART FAILURE, CHRONIC 01/05/2009    a. chemo induced cardiomyopathy b. cMRI (02/2009) EF 45% c. Myoview 05/2010: EF 59%, no ischemia d. RHC (01/2012) RA 20, RV 55/13/22, PA 56/34 (44), PCWP 28, Fick CO/CI: 3.1 / 1.5, PVR 5.3 WU, PA sat 42% & 48% e. ECHO (10/2012) EF 20-25%, diff HK, MV calcified annulus f. ECHO (03/2014) EF 30-35%, sev HK, inferior/inferoseptal walls, mild MR  . 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 Hypertension   . History of breast cancer 2006    with return in 2008, s/p mastectomy, XRT and chemotherapy  . Osteoarthritis   . Anxiety   . Depression     followed by a psychiatrist  . Asthma   . GERD (gastroesophageal reflux disease)   . Gastroparesis   . IBS (irritable bowel syndrome)   . Myocardial infarction ([place])   . ICD (implantable cardiac defibrillator) in place 11/10/2012    ICD (11/2012)  . COPD (chronic obstructive pulmonary disease) ([place])   . Persistent atrial fibrillation ([place]) 09/2014  . AICD (automatic cardioverter/defibrillator) present   . Presence of permanent cardiac pacemaker   . CHF (congestive heart failure) [facility])     Past Surgical History  Procedure Laterality Date  . Mastectomy  right  . Abdominal hysterectomy  1991  . Cardiac defibrillator placement  11/10/2012    SJM Fortify Assura VR implanted by Dr Rayann Heman for primary prevention  . Cholecystectomy    . Wrist surgery Bilateral   . Knee cartilage surgery    . Bi-ventricular implantable cardioverter defibrillator N/A 11/10/2012    SJM Forify Assura single chamber ICD  . Cardiac catheterization N/A 08/21/2015    Procedure: Right/Left Heart Cath and  Coronary Angiography;  Surgeon: Jolaine Artist, MD;  Location: [place] [facility];  Service: Cardiovascular;  Laterality: N/A;  . Esophagogastroduodenoscopy N/A 11/16/2015    Procedure: ESOPHAGOGASTRODUODENOSCOPY (EGD);  Surgeon: Manus Gunning, MD;  Location: [place];  Service: Gastroenterology;  Laterality: N/A;  . Colonoscopy N/A 11/17/2015    Procedure: COLONOSCOPY;  Surgeon: Jerene Bears, MD;  Location: [facility] ENDOSCOPY;  Service: Endoscopy;  Laterality: N/A;  . Givens capsule study N/A 12/11/2015    Procedure: GIVENS CAPSULE STUDY;  Surgeon: Manus Gunning, MD;  Location: [facility];  Service: Gastroenterology;  Laterality: N/A;    Prior to Admission medications   Medication Sig Start Date End Date Taking? Authorizing Provider  albuterol (PROVENTIL HFA) 108 (90 BASE) MCG/ACT inhaler Inhale 2 puffs into the lungs every 6 (six) hours as needed. For shortness of breath   Yes Historical Provider, MD  ALPRAZolam Duanne Moron) 0.5 MG tablet Take 0.5 mg by mouth daily as needed for anxiety.  11/06/15  Yes Historical Provider, MD  amiodarone (PACERONE) 200 MG tablet Take 1 tablet (200 mg total) by mouth daily. 09/05/15  Yes Amy D Clegg, NP  apixaban (ELIQUIS) 5 MG TABS tablet Take 1 tablet (5 mg total) by mouth 2 (two) times daily. 02/09/16  Yes Amy D Clegg, NP  busPIRone (BUSPAR) 30 MG tablet Take 30 mg by mouth 2 (two) times daily.    Yes Historical Provider, MD  carvedilol (COREG) 6.25 MG tablet Take 1 tablet (6.25 mg total) by mouth 2 (two) times daily with a meal. 11/18/15  Yes Mercy Riding, MD  digoxin (LANOXIN) 0.125 MG tablet Take 1 tablet (0.125 mg total) by mouth every other day. 06/28/15  Yes Jolaine Artist, MD  docusate sodium (COLACE) 100 MG capsule Take 3 capsules (300 mg total) by mouth at bedtime. 10/03/15  Yes Amy D Clegg, NP  DULoxetine (CYMBALTA) 30 MG capsule Take 30 mg by mouth daily.   Yes Amy A Moon, NP  furosemide (LASIX) 20 MG tablet Take 3 tablets (60 mg total)  by mouth 2 (two) times daily. Patient taking differently: Take 60 mg by mouth 2 (two) times daily. If 1 to 2 pound weight gain in a day, take extra 20 mg dose 02/01/16  Yes Satira Mccallum Tillery, PA-C  Garlic [id number] MG CAPS Take 1 capsule by mouth daily.    Yes Historical Provider, MD  Glucosamine 500 MG CAPS Take 1 capsule by mouth daily.     Yes Historical Provider, MD  HUMALOG KWIKPEN 100 UNIT/ML KiwkPen Inject 7-34 Units into the skin 3 (three) times daily. Sliding scale TID 10/30/15  Yes Historical Provider, MD  HUMALOG MIX 75/25 KWIKPEN (75-25) 100 UNIT/ML Kwikpen Inject 75 Units into the skin 2 (two) times daily. 11/21/15  Yes Historical Provider, MD  hydrOXYzine (ATARAX/VISTARIL) 50 MG tablet Take 50 mg by mouth every 6 (six) hours as needed for itching.   Yes Historical Provider, MD  ivabradine (CORLANOR) 5 MG TABS tablet Take 1 tablet (5 mg total) by mouth 2 (two)  times daily with a meal. 02/09/16  Yes Amy D Clegg, NP  levothyroxine (SYNTHROID, LEVOTHROID) 75 MCG tablet Take 75 mcg by mouth daily before breakfast.    Yes Historical Provider, MD  Melatonin 1 MG TABS Take 0.5 mg by mouth at bedtime.   Yes Historical Provider, MD  metolazone (ZAROXOLYN) 2.5 MG tablet Take 2.5 mg by mouth as needed (for weight gain >3 lbs).   Yes Historical Provider, MD  mometasone (NASONEX) 50 MCG/ACT nasal spray Place 1 spray into both nostrils daily. allergies   Yes Historical Provider, MD  Multiple Minerals (CALCIUM-MAGNESIUM-ZINC) TABS Take 1 tablet by mouth daily.    Yes Historical Provider, MD  nitroGLYCERIN (NITROSTAT) 0.4 MG SL tablet Place 1 tablet (0.4 mg total) under the tongue every 5 (five) minutes as needed. For chest pain 10/18/15  Yes Jolaine Artist, MD  pantoprazole (PROTONIX) 40 MG tablet Take 40 mg by mouth 2 (two) times daily.     Yes Historical Provider, MD  polyethylene glycol powder (GLYCOLAX/MIRALAX) powder Mix 17 grams in 8 oz of water twice daily 01/05/16  Yes Manus Gunning, MD   potassium chloride SA (K-DUR,KLOR-CON) 20 MEQ tablet Take 3 tablets (60 mEq total) by mouth 3 (three) times daily. With an additional 40 meq on Metolazone 02/01/16  Yes Shirley Friar, PA-C  pyridOXINE (VITAMIN B-6) 100 MG tablet Take 100 mg by mouth daily.   Yes Historical Provider, MD  ramipril (ALTACE) 2.5 MG capsule Take 1 capsule (2.5 mg total) by mouth daily. 02/01/16  Yes Shirley Friar, PA-C  rOPINIRole (REQUIP) 1 MG tablet Take 1 mg by mouth at bedtime.     Yes Historical Provider, MD  Simethicone Extra Strength 125 MG CAPS Take 1 tablet by mouth daily as needed (for bloating). Reported on 02/23/2016   Yes Historical Provider, MD  spironolactone (ALDACTONE) 25 MG tablet Take 25 mg by mouth daily.   Yes Historical Provider, MD  SUMAtriptan (IMITREX) 100 MG tablet Take 1 tablet by mouth daily as needed for migraine.  11/28/15  Yes Historical Provider, MD  tiotropium (SPIRIVA) 18 MCG inhalation capsule Place 18 mcg into inhaler and inhale daily.     Yes Historical Provider, MD  vitamin B-12 (CYANOCOBALAMIN) 1000 MCG tablet Take 1,000 mcg by mouth daily.   Yes Historical Provider, MD  vitamin C (ASCORBIC ACID) 500 MG tablet Take 500 mg by mouth daily.   Yes Historical Provider, MD  vitamin E 100 UNIT capsule Take 400 Units by mouth daily.   Yes Historical Provider, MD    Current Facility-Administered Medications  Medication Dose Route Frequency Provider Last Rate Last Dose  . acetaminophen (TYLENOL) tablet 650 mg  650 mg Oral Q6H PRN Hosie Poisson, MD       Or  . acetaminophen (TYLENOL) suppository 650 mg  650 mg Rectal Q6H PRN Hosie Poisson, MD      . albuterol (PROVENTIL) (2.5 MG/3ML) 0.083% nebulizer solution 2.5 mg  2.5 mg Inhalation Q6H PRN Hosie Poisson, MD      . ALPRAZolam Duanne Moron) tablet 0.5 mg  0.5 mg Oral Daily PRN Hosie Poisson, MD      . amiodarone (PACERONE) tablet 200 mg  200 mg Oral Daily Hosie Poisson, MD   200 mg at 02/23/16 2235  . busPIRone (BUSPAR) tablet 30 mg   30 mg Oral BID Hosie Poisson, MD   30 mg at 02/23/16 2235  . digoxin (LANOXIN) tablet 0.125 mg  0.125 mg Oral QODAY Hosie Poisson, MD      .  DULoxetine (CYMBALTA) DR capsule 30 mg  30 mg Oral Daily Hosie Poisson, MD   30 mg at 02/23/16 2235  . fluticasone (FLONASE) 50 MCG/ACT nasal spray 2 spray  2 spray Each Nare Daily Hosie Poisson, MD      . insulin aspart (novoLOG) injection 0-9 Units  0-9 Units Subcutaneous TID WC Cherene Altes, MD      . insulin aspart protamine- aspart (NOVOLOG MIX 70/30) injection 75 Units  75 Units Subcutaneous BID Hosie Poisson, MD   75 Units at 02/23/16 2200  . ivabradine (CORLANOR) tablet 5 mg  5 mg Oral BID WC Hosie Poisson, MD   5 mg at 02/24/16 [id number]  . levothyroxine (SYNTHROID, LEVOTHROID) tablet 75 mcg  75 mcg Oral QAC breakfast Hosie Poisson, MD   75 mcg at 02/24/16 [id number]  . ondansetron (ZOFRAN) tablet 4 mg  4 mg Oral Q6H PRN Hosie Poisson, MD       Or  . ondansetron (ZOFRAN) injection 4 mg  4 mg Intravenous Q6H PRN Hosie Poisson, MD      . pantoprazole (PROTONIX) injection 40 mg  40 mg Intravenous Q12H Elnora Morrison, MD   40 mg at 02/23/16 1635  . rOPINIRole (REQUIP) tablet 1 mg  1 mg Oral QHS Hosie Poisson, MD   1 mg at 02/23/16 2235  . sodium chloride flush (NS) 0.9 % injection 3 mL  3 mL Intravenous Q12H Hosie Poisson, MD   3 mL at 02/23/16 2200  . SUMAtriptan (IMITREX) tablet 100 mg  100 mg Oral Daily PRN Hosie Poisson, MD      . tiotropium (SPIRIVA) inhalation capsule 18 mcg  18 mcg Inhalation Daily Hosie Poisson, MD   18 mcg at 02/23/16 2053    Allergies as of 02/23/2016 - Review Complete 02/23/2016  Allergen Reaction Noted  . Bee venom Anaphylaxis 03/16/2014  . Aspirin Hives   . Ciprofloxacin Hives   . Codeine Nausea Only   . Naproxen Hives   . Other Other (See Comments) 08/19/2015  . Sulfonamide derivatives Hives   . Xarelto [rivaroxaban] Hives 12/11/2015  . Atorvastatin Itching 02/23/2016  . Nsaids Itching and Rash 12/25/2014  . Tape Rash 08/11/2011     Family History  Problem Relation Age of Onset  . Diabetes Mellitus II Mother   . Coronary artery disease Father     CABG x 3  . Breast cancer Cousin   . Breast cancer Cousin   . Uterine cancer Cousin   . Lung cancer Mother   . Congestive Heart Failure Mother   . Hypertension Father   . Diabetes Mellitus II Father     Social History   Social History  . Marital Status: Divorced    Spouse Name: N/A  . Number of Children: N/A  . Years of Education: N/A   Occupational History  . Disabled    Social History Main Topics  . Smoking status: Former Smoker    Quit date: 04/12/2001  . Smokeless tobacco: Never Used  . Alcohol Use: No  . Drug Use: No  . Sexual Activity: Not Currently   Other Topics Concern  . Not on file   Social History Narrative   Lives with mother and brother Philis Nettle          Review of Systems: Pertinent positive and negative review of systems were noted in the above HPI section. Complete review of systems was performed and was otherwise normal.   Physical Exam: Vital signs in last 24 hours: Temp:  [  97.8 F (36.6 C)-98.8 F (37.1 C)] 98.5 F (36.9 C) (07/22 0400) Pulse Rate:  [87-100] 87 (07/22 0600) Resp:  [13-28] 20 (07/22 0600) BP: (88-125)/(39-73) 98/50 mmHg (07/22 0600) SpO2:  [91 %-100 %] 95 % (07/22 0600) Weight:  [213 lb (96.616 kg)] 213 lb (96.616 kg) (07/21 2017) Last BM Date: 02/23/16 Constitutional: generally well-appearing Psychiatric: alert and oriented x3 Eyes: extraocular movements intact Mouth: oral pharynx moist, no lesions Neck: supple no lymphadenopathy Cardiovascular: heart regular rate and rhythm Lungs: clear to auscultation bilaterally Abdomen: soft, nontender, nondistended, no obvious ascites, no peritoneal signs, normal bowel sounds Extremities: no lower extremity edema bilaterally Skin: no lesions on visible extremities   Lab Results:  Recent Labs  02/23/16 1450 02/23/16 2225 02/24/16 0458  WBC 9.5   --   --   HGB 6.7* 7.2* 7.5*  HCT 22.9* 24.9* 25.6*  PLT 375  --   --   MCV 76.8*  --   --    BMET  Recent Labs  02/23/16 1450 02/24/16 0458  NA 133* 138  K 3.3* 3.7  CL 96* 99*  CO2 28 31  GLUCOSE 180* 137*  BUN 22* 17  CREATININE 1.16* 1.04*  CALCIUM 9.2 9.3   LFT  Recent Labs  02/23/16 1450  BILITOT 0.3  AST 20  ALT 16  ALKPHOS 63  PROT 6.9  ALBUMIN 3.2*    Impression/Plan: 60 y.o. female with recurrent GI bleeding (overt, obscure)  She had extensive GI testing 2-3 months ago for this without clear source, the GI plan was to proceed with small bowel enteroscopy for another examination and at same time to remove duodenal polyps that was possibly oozing at time of small bowel capsule 2 months ago.  I am certain that her blood thinner contributes to the pace, volume of her GI bleeding.  It is currently on hold. She is not in afib, perhaps it does NOT need to be restarted?  Her Hb has risen from 6.7 to 7.5 after 1 unit blood transfusion. Will plan for enteroscopy today.  Milus Banister, MD  02/24/2016, 7:06 AM [facility] Pager [phone]

## 2016-02-24 NOTE — Anesthesia Preprocedure Evaluation (Addendum)
Anesthesia Evaluation  Patient identified by MRN, date of birth, ID band Patient awake    Reviewed: Allergy & Precautions, NPO status , Patient's Chart, lab work & pertinent test results  Airway Mallampati: II  TM Distance: >3 FB Neck ROM: Full    Dental  (+) Edentulous Upper   Pulmonary asthma , COPD, former smoker,    breath sounds clear to auscultation       Cardiovascular hypertension, + CAD, + Past MI and +CHF  + pacemaker + Cardiac Defibrillator  Rhythm:Regular Rate:Normal     Neuro/Psych    GI/Hepatic GERD  ,  Endo/Other  diabetes  Renal/GU      Musculoskeletal  (+) Arthritis ,   Abdominal (+) + obese,   Peds  Hematology  (+) anemia ,   Anesthesia Other Findings   Reproductive/Obstetrics                           Anesthesia Physical Anesthesia Plan  ASA: IV  Anesthesia Plan: MAC   Post-op Pain Management:    Induction: Intravenous  Airway Management Planned: Natural Airway and Nasal Cannula  Additional Equipment:   Intra-op Plan:   Post-operative Plan:   Informed Consent: I have reviewed the patients History and Physical, chart, labs and discussed the procedure including the risks, benefits and alternatives for the proposed anesthesia with the patient or authorized representative who has indicated his/her understanding and acceptance.     Plan Discussed with: CRNA  Anesthesia Plan Comments:         Anesthesia Quick Evaluation

## 2016-02-24 NOTE — Op Note (Signed)
[facility] Patient Name: [name] Procedure Date : 02/24/2016 MRN: [id number] Attending MD: Milus Banister , MD Date of Birth: 11-06-1955 CSN: [id number] Age: 60 Admit Type: Inpatient Procedure:                Upper GI endoscopy Indications:              Melena, acute on chronic anemia, on eliquis;                            previous GI workup including colonsocopy, EGD,                            SBCE; was scheduled for enteroscopy with Dr.                            Havery Moros in 3-4 weeks Providers:                Milus Banister, MD, Hilma Favors, RN, Zenon Mayo, RN, William Dalton, Technician Referring MD:              Medicines:                Monitored Anesthesia Care Complications:            No immediate complications. Estimated blood loss:                            Minimal. Estimated Blood Loss:     Estimated blood loss was minimal. Procedure:                Pre-Anesthesia Assessment:                           - Prior to the procedure, a History and Physical                            was performed, and patient medications and                            allergies were reviewed. The patient's tolerance of                            previous anesthesia was also reviewed. The risks                            and benefits of the procedure and the sedation                            options and risks were discussed with the patient.                            All questions were answered, and informed consent  was obtained. Prior Anticoagulants: The patient has                            taken Eliquis (apixaban), last dose was 2 days                            prior to procedure. ASA Grade Assessment: III - A                            patient with severe systemic disease. After                            reviewing the risks and benefits, the patient was                            deemed in  satisfactory condition to undergo the                            procedure.                           After obtaining informed consent, the endoscope was                            passed under direct vision. Throughout the                            procedure, the patient's blood pressure, pulse, and                            oxygen saturations were monitored continuously. The                            [id number]) scope was introduced through                            the mouth, and advanced to the proximal jejunum.                            The upper GI endoscopy was accomplished without                            difficulty. The patient tolerated the procedure                            well. Findings:      The esophagus was normal.      The stomach was normal.      The examined jejunum was normal.      Two small polyps were noted in the duodenal bulb.      After evaluating the jejunum I was preparing to snare resect the       duodenal polyps when I noted fresh bright red blood just distal the       duodenal bulb. With probing I located a cherry red, actively oozing 3  mm       angiodysplastic lesion with bleeding was found in the second portion of       the duodenum (close to the major papilla but clearly not communicating       with the major papilla). Coagulation for tissue destruction using argon       plasma was successful. Impression:               - Normal esophagus.                           - Normal stomach.                           - Normal examined jejunum.                           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 This was treated with argon plasma                            coagulation (APC) successfully. See above.                           - No specimens collected. Moderate Sedation:      none Recommendation:           - Return patient to hospital ward for ongoing care.                           - Resume  previous diet.                           - Continue present medications.                           - OK to restart eliquis tomorrow.                           - I will communicate these results with Dr.                            Havery Moros who was planning enteroscopy in 3-4                            weeks, that is probably not necessary now. Procedure Code(s):        --- Professional ---                           [phone], Esophagogastroduodenoscopy, flexible,                            transoral; with control of bleeding, any method Diagnosis Code(s):        --- Professional ---  K31.811, Angiodysplasia of stomach and duodenum                            with bleeding                           K92.1, Melena (includes Hematochezia) CPT copyright 2016 [employer]. All rights reserved. The codes documented in this report are preliminary and upon coder review may  be revised to meet current compliance requirements. Milus Banister, MD 02/24/2016 11:01:09 AM This report has been signed electronically. Number of Addenda: 0

## 2016-02-24 NOTE — Progress Notes (Addendum)
[place] TEAM 1 - Stepdown/ICU TEAM  [name]  [id number] DOB: 25-Nov-1955 DOA: 02/23/2016 PCP: Nicoletta Dress, MD    Brief Narrative:  60 y.o. female with a history of chronic systolic heart failure, hypertension, diabetes mellitus, hyperlipidemia, OSA, hypothyroidism, anxiety, and s/p AICD/pacemaker placement who was asked by her PCP to go to the ED for hemoglobin of 7. In the ED she was found to have a hemoglobin of 6.7, potassium of 3.3, sodium of 133. She reported dizziness and lethargy w/ black stools for 24hrs, but no frank bleeding per rectum.    Subjective: The patient is sitting up in bed with no complaints.  She denies chest pain shortness of breath fever nausea or vomiting.  She states she is very hungry.  She is happy to have gotten some results from her EGD.  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 - resume anticoag in AM - follow for evidence of ongoing bleeding until clear pt stable   Acute blood loss anemia Significant iron deficiency appreciated - load with IV iron - transfuse as required to keep hemoglobin 7.0 or > - watch Hgb in serial fashion until clear stable   Recent Labs Lab 02/23/16 1450 02/23/16 2225 02/24/16 0458  HGB 6.7* 7.2* 7.5*    Chronic systolic heart failure EF 30-35% via TTE Jan 2017 w/ diffuse hypokinesis - resume home medication regimen - no evidence of significant volume overload at present - baseline wgt appears to be [employer]   02/23/16 1447 02/23/16 2017  Weight: 96.616 kg (213 lb) 96.616 kg (213 lb)    OSA on CPAP  Paroxysmal atrial fibrillation Currently in normal sinus rhythm - resume anticoagulation tomorrow  DM 1 CBGs trending upward - adjust treatment and follow  Hypokalemia Potassium improved but not yet at goal - continue to supplement and follow  DVT prophylaxis: SCD Code Status: FULL CODE Family Communication: no family present at time of exam    Disposition Plan: SDU - possible discharge in 24 hours  Consultants:  [facility]  Procedures: 7/22 EGD  Antimicrobials:  none  Objective: Blood pressure 102/68, pulse 111, temperature 97.9 F (36.6 C), temperature source Oral, resp. rate 22, height 5\' 5"  (1.651 m), weight 96.616 kg (213 lb), SpO2 99 %.  Intake/Output Summary (Last 24 hours) at 02/24/16 1256 Last data filed at 02/24/16 1103  Gross per 24 hour  Intake   1175 ml  Output    405 ml  Net    770 ml   Filed Weights   02/23/16 1447 02/23/16 2017  Weight: 96.616 kg (213 lb) 96.616 kg (213 lb)    Examination: General: No acute respiratory distress Lungs: Clear to auscultation bilaterally without wheezes or crackles Cardiovascular: Regular rate and rhythm without murmur gallop or rub normal S1 and S2 Abdomen: Nontender, obese, soft, bowel sounds positive, no rebound, no ascites, no appreciable mass Extremities: No significant cyanosis, clubbing, or edema bilateral lower extremities  CBC:  Recent Labs Lab 02/23/16 1450 02/23/16 2225 02/24/16 0458  WBC 9.5  --   --   HGB 6.7* 7.2* 7.5*  HCT 22.9* 24.9* 25.6*  MCV 76.8*  --   --   PLT 375  --   --    Basic Metabolic Panel:  Recent Labs Lab 02/23/16 1450 02/24/16 0458  NA 133* 138  K 3.3* 3.7  CL 96* 99*  CO2 28 31  GLUCOSE 180* 137*  BUN 22* 17  CREATININE 1.16* 1.04*  CALCIUM 9.2 9.3   GFR: Estimated Creatinine Clearance: 66.1 mL/min (by C-G formula based on Cr of 1.04).  Liver Function Tests:  Recent Labs Lab 02/23/16 1450  AST 20  ALT 16  ALKPHOS 63  BILITOT 0.3  PROT 6.9  ALBUMIN 3.2*    HbA1C: HGB A1C MFR BLD  Date/Time Value Ref Range Status  11/14/2015 06:17 PM 7.3* 4.8 - 5.6 % Final    Comment:    (NOTE)         Pre-diabetes: 5.7 - 6.4         Diabetes: >6.4         Glycemic control for adults with diabetes: <7.0   08/19/2015 08:35 PM 8.4* 4.8 - 5.6 % Final    Comment:    (NOTE)         Pre-diabetes: 5.7 - 6.4          Diabetes: >6.4         Glycemic control for adults with diabetes: <7.0     CBG:  Recent Labs Lab 02/23/16 2155 02/24/16 0832  GLUCAP 195* 177*    Recent Results (from the past 240 hour(s))  MRSA PCR Screening     Status: None   Collection Time: 02/23/16  8:17 PM  Result Value Ref Range Status   MRSA by PCR NEGATIVE NEGATIVE Final    Comment:        The GeneXpert MRSA Assay (FDA approved for NASAL specimens only), is one component of a comprehensive MRSA colonization surveillance program. It is not intended to diagnose MRSA infection nor to guide or monitor treatment for MRSA infections.      Scheduled Meds: . amiodarone  200 mg Oral Daily  . busPIRone  30 mg Oral BID  . digoxin  0.125 mg Oral QODAY  . DULoxetine  30 mg Oral Daily  . fluticasone  2 spray Each Nare Daily  . insulin aspart  0-9 Units Subcutaneous TID WC  . insulin aspart protamine- aspart  75 Units Subcutaneous BID  . ivabradine  5 mg Oral BID WC  . levothyroxine  75 mcg Oral QAC breakfast  . pantoprazole (PROTONIX) IV  40 mg Intravenous Q12H  . rOPINIRole  1 mg Oral QHS  . sodium chloride flush  3 mL Intravenous Q12H  . tiotropium  18 mcg Inhalation Daily     LOS: 1 day    Cherene Altes, MD [facility] Office  [phone] Pager - Text Page per Amion as per below:  On-Call/Text Page:      [email or web address]      password [email or web address]  If 7PM-7AM, please contact night-coverage [email or web address] Password [facility] 02/24/2016, 12:56 PM

## 2016-02-24 NOTE — Transfer of Care (Signed)
Immediate Anesthesia Transfer of Care Note  Patient: [name]  Procedure(s) Performed: Procedure(s): ESOPHAGOGASTRODUODENOSCOPY (EGD) (N/A)  Patient Location: PACU and Endoscopy Unit  Anesthesia Type:MAC  Level of Consciousness: awake, alert , oriented and sedated  Airway & Oxygen Therapy: Patient Spontanous Breathing and Patient connected to face mask oxygen  Post-op Assessment: Report given to RN, Post -op Vital signs reviewed and stable and Patient moving all extremities  Post vital signs: Reviewed and stable  Last Vitals:  Filed Vitals:   02/24/16 0800 02/24/16 1017  BP: 106/46 106/52  Pulse: 90 90  Temp: 36.4 C   Resp: 23 20    Last Pain: There were no vitals filed for this visit.       Complications: No apparent anesthesia complications

## 2016-02-25 DIAGNOSIS — D649 Anemia, unspecified: Secondary | ICD-10-CM

## 2016-02-25 DIAGNOSIS — E1040 Type 1 diabetes mellitus with diabetic neuropathy, unspecified: Secondary | ICD-10-CM

## 2016-02-25 LAB — COMPREHENSIVE METABOLIC PANEL
ALT: 15 U/L (ref 14–54)
AST: 21 U/L (ref 15–41)
Albumin: 3.1 g/dL — ABNORMAL LOW (ref 3.5–5.0)
Alkaline Phosphatase: 60 U/L (ref 38–126)
Anion gap: 11 (ref 5–15)
BUN: 14 mg/dL (ref 6–20)
CHLORIDE: 98 mmol/L — AB (ref 101–111)
CO2: 25 mmol/L (ref 22–32)
CREATININE: 1.01 mg/dL — AB (ref 0.44–1.00)
Calcium: 9.2 mg/dL (ref 8.9–10.3)
GFR, EST NON AFRICAN AMERICAN: 59 mL/min — AB (ref >60)
Glucose, Bld: 161 mg/dL — ABNORMAL HIGH (ref 65–99)
POTASSIUM: 3.4 mmol/L — AB (ref 3.5–5.1)
SODIUM: 134 mmol/L — AB (ref 135–145)
Total Bilirubin: 0.9 mg/dL (ref 0.3–1.2)
Total Protein: 6.9 g/dL (ref 6.5–8.1)

## 2016-02-25 LAB — CBC
HCT: 26 % — ABNORMAL LOW (ref 36.0–46.0)
Hemoglobin: 7.6 g/dL — ABNORMAL LOW (ref 12.0–15.0)
MCH: 23.4 pg — AB (ref 26.0–34.0)
MCHC: 29.2 g/dL — ABNORMAL LOW (ref 30.0–36.0)
MCV: 80 fL (ref 78.0–100.0)
PLATELETS: 357 10*3/uL (ref 150–400)
RBC: 3.25 MIL/uL — AB (ref 3.87–5.11)
RDW: 17.9 % — AB (ref 11.5–15.5)
WBC: 10.2 10*3/uL (ref 4.0–10.5)

## 2016-02-25 LAB — GLUCOSE, CAPILLARY
GLUCOSE-CAPILLARY: 210 mg/dL — AB (ref 65–99)
Glucose-Capillary: 65 mg/dL (ref 65–99)
Glucose-Capillary: 83 mg/dL (ref 65–99)
Glucose-Capillary: 181 mg/dL — ABNORMAL HIGH (ref 65–99)

## 2016-02-25 MED ORDER — POTASSIUM CHLORIDE CRYS ER 20 MEQ PO TBCR: 40.0000 meq | EXTENDED_RELEASE_TABLET | Freq: Once | ORAL | Status: DC

## 2016-02-25 MED ORDER — FERROUS SULFATE 325 (65 FE) MG PO TABS: 325.0000 mg | ORAL_TABLET | Freq: Every day | ORAL | Status: DC

## 2016-02-25 MED ORDER — INSULIN ASPART PROT & ASPART (70-30 MIX) 100 UNIT/ML ~~LOC~~ SUSP
50.0000 [IU] | Freq: Two times a day (BID) | SUBCUTANEOUS | Status: DC
  Filled 2016-02-25: qty 10

## 2016-02-25 MED ORDER — INSULIN ASPART PROT & ASPART (70-30 MIX) 100 UNIT/ML ~~LOC~~ SUSP
50.0000 [IU] | Freq: Two times a day (BID) | SUBCUTANEOUS | Status: DC
  Administered 2016-02-25: 50 [IU] via SUBCUTANEOUS

## 2016-02-25 MED ORDER — APIXABAN 5 MG PO TABS
5.0000 mg | ORAL_TABLET | Freq: Two times a day (BID) | ORAL | 0 refills | Status: DC
Start: 2016-02-26 — End: 2016-03-19

## 2016-02-25 MED ORDER — FUROSEMIDE 20 MG PO TABS: 60.0000 mg | ORAL_TABLET | Freq: Two times a day (BID) | ORAL | 0 refills | Status: DC

## 2016-02-25 MED ORDER — FERRALET 90 90-1 MG PO TABS: 1.0000 | ORAL_TABLET | Freq: Every day | ORAL | 0 refills | Status: DC

## 2016-02-25 NOTE — Discharge Summary (Signed)
DISCHARGE SUMMARY  [name]  MR#: [id number]  DOB:Dec 07, 1955  Date of Admission: 02/23/2016 Date of Discharge: 02/25/2016  Attending Physician:MCCLUNG,JEFFREY T  Patient's BC:[id number] E, MD  Consults:  [facility]   Disposition: D/C home   Follow-up Appts: Follow-up Information    SCHULTZ,DOUGLAS E, MD. Schedule an appointment as soon as possible for a visit today.   Specialty:  Internal Medicine Contact information: [place] [address] [phone]          Tests Needing Follow-up: -repeat CBC is suggested in 3-5 days   Discharge Diagnoses: Acute GI bleed Acute blood loss anemia Chronic systolic heart failure OSA on CPAP Paroxysmal atrial fibrillation DM 1 Hypokalemia  Initial presentation: 60 y.o. female with a history of chronic systolic heart failure, hypertension, diabetes mellitus, hyperlipidemia, OSA, hypothyroidism, anxiety, and s/p AICD/pacemaker placement who was asked by her PCP to go to the ED for hemoglobin of 7. In the ED she was found to have a hemoglobin of 6.7, potassium of 3.3, sodium of 133. She reported dizziness and lethargy w/ black stools for 24hrs, but no frank bleeding per rectum.    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 - resume 7/24 - no clinical evidence of further bleeding at time of discharge  Acute blood loss anemia Significant iron deficiency appreciated - loaded with IV iron - keep hemoglobin 7.0 or > - hemoglobin stable at time of discharge - initiate oral iron therapy  Last Labs    Recent Labs Lab 02/23/16 1450 02/23/16 2225 02/24/16 0458  HGB 6.7* 7.2* 7.5*      Chronic systolic heart failure EF 30-35% via TTE Jan 2017 w/ diffuse hypokinesis - resumed home medication regimen - no evidence of significant volume overload during this hospital stay - baseline wgt appears to be ~97kg      [facility] Weights   02/23/16 1447  02/23/16 2017  Weight: 96.616 kg (213 lb) 96.616 kg (213 lb)    OSA on CPAP  Paroxysmal atrial fibrillation in normal sinus rhythm throughout hospitalization - resume anticoagulation 7/24   DM 1 CBGs well controlled at time of discharge  Hypokalemia Potassium improved - cont to supplement as outpt as per home regimen      Medication List    TAKE these medications   ALPRAZolam 0.5 MG tablet Commonly known as:  XANAX Take 0.5 mg by mouth daily as needed for anxiety.   amiodarone 200 MG tablet Commonly known as:  PACERONE Take 1 tablet (200 mg total) by mouth daily.   apixaban 5 MG Tabs tablet Commonly known as:  ELIQUIS Take 1 tablet (5 mg total) by mouth 2 (two) times daily. Start taking on:  02/26/2016   busPIRone 30 MG tablet Commonly known as:  BUSPAR Take 30 mg by mouth 2 (two) times daily.   Calcium-Magnesium-Zinc Tabs Take 1 tablet by mouth daily.   carvedilol 6.25 MG tablet Commonly known as:  COREG Take 1 tablet (6.25 mg total) by mouth 2 (two) times daily with a meal.   digoxin 0.125 MG tablet Commonly known as:  LANOXIN Take 1 tablet (0.125 mg total) by mouth every other day.   docusate sodium 100 MG capsule Commonly known as:  COLACE Take 3 capsules (300 mg total) by mouth at bedtime.   DULoxetine 30 MG capsule Commonly known as:  CYMBALTA Take 30 mg by mouth daily.   FERRALET 90 90-1 MG  Tabs Take 1 tablet by mouth daily.   furosemide 20 MG tablet Commonly known as:  LASIX Take 3 tablets (60 mg total) by mouth 2 (two) times daily. If 1 to 2 pound weight gain in a day, take extra 20 mg dose What changed:  additional instructions   Garlic [id number] MG Caps Take 1 capsule by mouth daily.   Glucosamine 500 MG Caps Take 1 capsule by mouth daily.   HUMALOG KWIKPEN 100 UNIT/ML KiwkPen Generic drug:  insulin lispro Inject 7-34 Units into the skin 3 (three) times daily. Sliding scale TID   HUMALOG MIX 75/25 KWIKPEN (75-25) 100 UNIT/ML  Kwikpen Generic drug:  Insulin Lispro Prot & Lispro Inject 75 Units into the skin 2 (two) times daily.   hydrOXYzine 50 MG tablet Commonly known as:  ATARAX/VISTARIL Take 50 mg by mouth every 6 (six) hours as needed for itching.   ivabradine 5 MG Tabs tablet Commonly known as:  CORLANOR Take 1 tablet (5 mg total) by mouth 2 (two) times daily with a meal.   levothyroxine 75 MCG tablet Commonly known as:  SYNTHROID, LEVOTHROID Take 75 mcg by mouth daily before breakfast.   Melatonin 1 MG Tabs Take 0.5 mg by mouth at bedtime.   metolazone 2.5 MG tablet Commonly known as:  ZAROXOLYN Take 2.5 mg by mouth as needed (for weight gain >3 lbs).   mometasone 50 MCG/ACT nasal spray Commonly known as:  NASONEX Place 1 spray into both nostrils daily. allergies   nitroGLYCERIN 0.4 MG SL tablet Commonly known as:  NITROSTAT Place 1 tablet (0.4 mg total) under the tongue every 5 (five) minutes as needed. For chest pain   pantoprazole 40 MG tablet Commonly known as:  PROTONIX Take 40 mg by mouth 2 (two) times daily.   polyethylene glycol powder powder Commonly known as:  GLYCOLAX/MIRALAX Mix 17 grams in 8 oz of water twice daily   potassium chloride SA 20 MEQ tablet Commonly known as:  K-DUR,KLOR-CON Take 3 tablets (60 mEq total) by mouth 3 (three) times daily. With an additional 40 meq on Metolazone   PROVENTIL HFA 108 (90 Base) MCG/ACT inhaler Generic drug:  albuterol Inhale 2 puffs into the lungs every 6 (six) hours as needed. For shortness of breath   pyridOXINE 100 MG tablet Commonly known as:  VITAMIN B-6 Take 100 mg by mouth daily.   ramipril 2.5 MG capsule Commonly known as:  ALTACE Take 1 capsule (2.5 mg total) by mouth daily.   rOPINIRole 1 MG tablet Commonly known as:  REQUIP Take 1 mg by mouth at bedtime.   Simethicone Extra Strength 125 MG Caps Take 1 tablet by mouth daily as needed (for bloating). Reported on 02/23/2016   spironolactone 25 MG tablet Commonly  known as:  ALDACTONE Take 25 mg by mouth daily.   SUMAtriptan 100 MG tablet Commonly known as:  IMITREX Take 1 tablet by mouth daily as needed for migraine.   tiotropium 18 MCG inhalation capsule Commonly known as:  SPIRIVA Place 18 mcg into inhaler and inhale daily.   vitamin B-12 1000 MCG tablet Commonly known as:  CYANOCOBALAMIN Take 1,000 mcg by mouth daily.   vitamin C 500 MG tablet Commonly known as:  ASCORBIC ACID Take 500 mg by mouth daily.   vitamin E 100 UNIT capsule Take 400 Units by mouth daily.       Day of Discharge BP (!) 108/53   Pulse 86   Temp 98.8 F (37.1 C) (Axillary)   Resp Marland Kitchen)  21   Ht 5\' 5"  (1.651 m)   Wt 96.6 kg (213 lb)   SpO2 96%   BMI 35.45 kg/m   Physical Exam: General: No acute respiratory distress Lungs: Clear to auscultation bilaterally without wheezes or crackles Cardiovascular: Regular rate and rhythm without murmur gallop or rub normal S1 and S2 Abdomen: Nontender, nondistended, soft, bowel sounds positive, no rebound, no ascites, no appreciable mass Extremities: No significant cyanosis, clubbing, or edema bilateral lower extremities  Basic Metabolic Panel:  Recent Labs Lab 02/23/16 1450 02/24/16 0458 02/25/16 0342  NA 133* 138 134*  K 3.3* 3.7 3.4*  CL 96* 99* 98*  CO2 28 31 25   GLUCOSE 180* 137* 161*  BUN 22* 17 14  CREATININE 1.16* 1.04* 1.01*  CALCIUM 9.2 9.3 9.2    Liver Function Tests:  Recent Labs Lab 02/23/16 1450 02/25/16 0342  AST 20 21  ALT 16 15  ALKPHOS 63 60  BILITOT 0.3 0.9  PROT 6.9 6.9  ALBUMIN 3.2* 3.1*   CBC:  Recent Labs Lab 02/23/16 1450 02/23/16 2225 02/24/16 0458 02/24/16 1754 02/25/16 0342  WBC 9.5  --   --  10.5 10.2  HGB 6.7* 7.2* 7.5* 7.3* 7.6*  HCT 22.9* 24.9* 25.6* 25.1* 26.0*  MCV 76.8*  --   --  78.7 80.0  PLT 375  --   --  339 357    CBG:  Recent Labs Lab 02/24/16 1628 02/24/16 2005 02/25/16 0141 02/25/16 0202 02/25/16 0918  GLUCAP 132* 149* 65 83 181*     Recent Results (from the past 240 hour(s))  MRSA PCR Screening     Status: None   Collection Time: 02/23/16  8:17 PM  Result Value Ref Range Status   MRSA by PCR NEGATIVE NEGATIVE Final    Comment:        The GeneXpert MRSA Assay (FDA approved for NASAL specimens only), is one component of a comprehensive MRSA colonization surveillance program. It is not intended to diagnose MRSA infection nor to guide or monitor treatment for MRSA infections.      Time spent in discharge (includes decision making & examination of pt): >30 minutes  02/25/2016, 10:41 AM   Cherene Altes, MD [facility] Office  [phone] Pager [phone]  On-Call/Text Page:      [email or web address]      password [facility]

## 2016-02-25 NOTE — Discharge Instructions (Signed)
Medication Information:  If the prescription iron I have prescribed is not covered by your insurance you may take over-the-counter iron sulfate 325 mg 1 every night in its place.   Gastrointestinal Bleeding Gastrointestinal bleeding is bleeding somewhere along the path that food travels through the body (digestive tract). This path is anywhere between the mouth and the opening of the butt (anus). You may have blood in your throw up (vomit) or in your poop (stools). If there is a lot of bleeding, you may need to stay in the hospital. [place]  Only take medicine as told by your doctor.  Eat foods with fiber such as whole grains, fruits, and vegetables. You can also try eating 1 to 3 prunes a day.  Drink enough fluids to keep your pee (urine) clear or pale yellow. GET HELP RIGHT AWAY IF:   Your bleeding gets worse.  You feel dizzy, weak, or you pass out (faint).  You have bad cramps in your back or belly (abdomen).  You have large blood clumps (clots) in your poop.  Your problems are getting worse. MAKE SURE YOU:   Understand these instructions.  Will watch your condition.  Will get help right away if you are not doing well or get worse.   This information is not intended to replace advice given to you by your health care provider. Make sure you discuss any questions you have with your health care provider.   Document Released: 04/30/2008 Document Revised: 07/08/2012 Document Reviewed: 01/09/2015 Elsevier Interactive Patient Education [employer].

## 2016-02-25 NOTE — Plan of Care (Signed)
Problem: Education: Goal: Knowledge of [facility] Education information/materials will improve Outcome: Progressing Patient had low blood sugar around 0200 of 68 but asymptomatic.  Patient ate a snack and recheck was 83.

## 2016-02-25 NOTE — Progress Notes (Signed)
[facility] Progress Note    Since last GI note: Enteroscopy yesterday, see full note in chart.  Intermittently oozing duodenal AVM was treated with APC.  She ate solid food yesterday.  No BM since admission.   Objective: Vital signs in last 24 hours: Temp:  [97.5 F (36.4 C)-98.8 F (37.1 C)] 98.8 F (37.1 C) (07/23 0400) Pulse Rate:  [80-116] 86 (07/23 0500) Resp:  [12-24] 21 (07/23 0500) BP: (93-121)/(43-74) 108/53 (07/23 0500) SpO2:  [82 %-100 %] 89 % (07/23 0500) Last BM Date: 02/23/16 General: alert and oriented times 3 Heart: regular rate and rythm Abdomen: soft, non-tender, non-distended, normal bowel sounds   Lab Results:  Recent Labs  02/23/16 1450  02/24/16 0458 02/24/16 1754 02/25/16 0342  WBC 9.5  --   --  10.5 10.2  HGB 6.7*  < > 7.5* 7.3* 7.6*  PLT 375  --   --  339 357  MCV 76.8*  --   --  78.7 80.0  < > = values in this interval not displayed.  Recent Labs  02/23/16 1450 02/24/16 0458 02/25/16 0342  NA 133* 138 134*  K 3.3* 3.7 3.4*  CL 96* 99* 98*  CO2 28 31 25   GLUCOSE 180* 137* 161*  BUN 22* 17 14  CREATININE 1.16* 1.04* 1.01*  CALCIUM 9.2 9.3 9.2    Recent Labs  02/23/16 1450 02/25/16 0342  PROT 6.9 6.9  ALBUMIN 3.2* 3.1*  AST 20 21  ALT 16 15  ALKPHOS 63 60  BILITOT 0.3 0.9   Medications: Scheduled Meds: . amiodarone  200 mg Oral Daily  . busPIRone  30 mg Oral BID  . carvedilol  6.25 mg Oral BID WC  . digoxin  0.125 mg Oral QODAY  . docusate sodium  300 mg Oral QHS  . DULoxetine  30 mg Oral Daily  . fluticasone  2 spray Each Nare Daily  . furosemide  60 mg Oral BID  . insulin aspart  0-20 Units Subcutaneous TID WC  . insulin aspart  0-5 Units Subcutaneous QHS  . insulin aspart protamine- aspart  75 Units Subcutaneous BID  . ivabradine  5 mg Oral BID WC  . levothyroxine  75 mcg Oral QAC breakfast  . pantoprazole  40 mg Oral BID  . potassium chloride SA  60 mEq Oral TID  . rOPINIRole  1 mg Oral QHS  .  sodium chloride flush  3 mL Intravenous Q12H  . spironolactone  25 mg Oral Daily  . tiotropium  18 mcg Inhalation Daily   Continuous Infusions:  PRN Meds:.acetaminophen **OR** acetaminophen, albuterol, ALPRAZolam, hydrOXYzine, ondansetron **OR** ondansetron (ZOFRAN) IV, SUMAtriptan    Assessment/Plan: 60 y.o. female with anemia, duodenal AVM, on eliquis  The duodenal AVM was treated yesterday, MAY have been the source of her melena recently.  Hopefully it will not bleed again. She does understand that she may have more small bowel AVMs.  OK to resume blood thinner later tonight or tomorrow AM.  She is safe for d/c from GI perspective.  Her Hb still pretty low and I recommended she take OTC iron supplement for the next 2-3 months.  She knows it may make her stool look unusual, darker but that does not necessarily mean she is bleeding again.  [facility] will check blood counts in 2 weeks (will call to remind her about this).  She probably does not need the previously scheduled enteroscopy in late August but I will leave that to her primary GI,  Dr. Havery Moros.  Please call or page with any further questions or concerns.     Milus Banister, MD  02/25/2016, 7:03 AM [facility] Pager [phone]

## 2016-02-26 ENCOUNTER — Other Ambulatory Visit: Payer: Self-pay | Admitting: *Deleted

## 2016-02-26 ENCOUNTER — Other Ambulatory Visit: Payer: Self-pay

## 2016-02-26 DIAGNOSIS — Z794 Long term (current) use of insulin: Secondary | ICD-10-CM

## 2016-02-26 DIAGNOSIS — E118 Type 2 diabetes mellitus with unspecified complications: Secondary | ICD-10-CM

## 2016-02-26 DIAGNOSIS — I5022 Chronic systolic (congestive) heart failure: Secondary | ICD-10-CM

## 2016-02-26 DIAGNOSIS — D649 Anemia, unspecified: Secondary | ICD-10-CM

## 2016-02-26 NOTE — Patient Outreach (Signed)
[facility]) Care Management  02/26/2016  [name] 10-15-1955 [id number]   Received incoming call from Mrs.Padron to notify me that she had been in admitted to [facility] on 7/21 and was discharged on 7/23 her admitting diagnosis was GI bleed, she discussed how she had been feeling more tired over the last few days prior to admission and see made an appointment to see her PCP on 7/21 and her hemoglobin was 7.0, patient denies see any bleeding in her stools, it was recommended that she come to [facility] ED.  Mrs.Corzine discussed ,GI procedure that she had on 7/22.  Mrs.Hacker states she started back taking her Eliquis on today. Patient voiced understanding of importance of notify MD of noted bleeding. Patient reports that she has not checked her blood sugar on today yet, her weight is 215, patient voiced understanding of importance of notifying MD of weight gain of 2 pounds in a day and 5 pounds in a week. Patient was recently discharged from hospital and all medications have been reviewed. Mrs.Amero understands to make and appointment to see her PCP on this week and states she will call today.  Plan Will transition back to community care coordinator for transition of care, patient will receive weekly outreaches, plan next telephone outreach in next week. Discussed  transition  back to community nurse with Joaquim Lai pleasant, Health Coach Will send MD barrier letter.   [facility] CM Care Plan Problem One   Flowsheet Row Most Recent Value  Care Plan Problem One  (P) HIgh Risk for hospital admission as evidenced by recent admission.  Role Documenting the Problem One  (P) Care Management [place] for Problem One  (P) Active  [facility] Long Term Goal (31-90 days)  (P) Patient will not experience a hospital readmission in the next 31 days  [facility] Long Term Goal Start Date  (P) 12/26/15  [facility] Long Term Goal Met Date  (P) 01/29/16  [facility] CM Short Term Goal #1 (0-30  days)  (P) Patient will attend post hospital MD visit in the next 14 days  [facility] CM Short Term Goal #1 Start Date  (P) 12/26/15  [facility] CM Short Term Goal #1 Met Date  (P) 01/02/16  [facility] CM Short Term Goal #2 (0-30 days)  (P) Patient will be able to state symptoms of heart failure and action plan to take in the next 30 days  [facility] CM Short Term Goal #2 Start Date  (P) 12/26/15  [facility] CM Short Term Goal #2 Met Date  (P) 01/22/16  [facility] CM Short Term Goal #3 (0-30 days)  (P) Patient will continue to weigh and record daily in the next 30 days  [facility] CM Short Term Goal #3 Start Date  (P) 12/26/15  [facility] CM Short Term Goal #3 Met Date  (P) 01/15/16  [facility] CM Short Term Goal #4 (0-30 days)  (P) Patient will report increased knowledge and adherence to low salt diet in the next 30 days   [facility] CM Short Term Goal #4 Start Date  (P) 01/08/16  [facility] CM Short Term Goal #4 Met Date  (P) 01/22/16  [facility] CM Short Term Goal #5 (0-30 days)  (P) Patient will receive prior authorization for Eliquis in the next 7 days  [facility] CM Short Term Goal #5 Start Date  (P) 01/08/16  [facility] CM Short Term Goal #5 Met Date  (P) 01/15/16       Joylene Draft, RN, [facility] [phone]- Mobile  [phone]- Toll Free Main Office

## 2016-02-27 ENCOUNTER — Telehealth: Payer: Self-pay | Admitting: Gastroenterology

## 2016-02-27 LAB — TYPE AND SCREEN
ABO/RH(D): A POS
ANTIBODY SCREEN: NEGATIVE
UNIT DIVISION: 0
UNIT DIVISION: 0

## 2016-02-28 NOTE — Telephone Encounter (Signed)
I would keep her on the schedule for EGD. Dr. Ardis Hughs recently scoped her for GI bleed and found the source but she has a few duodenal polyps that may be adenomas that need removal. Thanks

## 2016-02-28 NOTE — Telephone Encounter (Signed)
Left a message for patient to call back. 

## 2016-02-29 DIAGNOSIS — Z79899 Other long term (current) drug therapy: Secondary | ICD-10-CM | POA: Diagnosis not present

## 2016-02-29 DIAGNOSIS — I10 Essential (primary) hypertension: Secondary | ICD-10-CM | POA: Diagnosis not present

## 2016-02-29 DIAGNOSIS — D62 Acute posthemorrhagic anemia: Secondary | ICD-10-CM | POA: Diagnosis not present

## 2016-02-29 NOTE — Telephone Encounter (Signed)
Left a message for patient to call back. 

## 2016-02-29 NOTE — Telephone Encounter (Signed)
Patient given recommendation. 

## 2016-03-05 ENCOUNTER — Other Ambulatory Visit: Payer: Self-pay | Admitting: *Deleted

## 2016-03-05 DIAGNOSIS — D62 Acute posthemorrhagic anemia: Secondary | ICD-10-CM | POA: Diagnosis not present

## 2016-03-05 NOTE — Patient Outreach (Signed)
[facility]) Care Management  03/05/2016  KENDL ZAVERI 1955/09/27 [id number]   Transition of care call  10 :72 RN placed scheduled transition of care call to patient' home number , unable to leave a message   14:00 RN placed a scheduled call to patient no answer, able to leave a HIPAA compliant message on her cell phone with my return contact number.  Plan Will reschedule transition of care call for this week.   Joylene Draft, RN, [facility] [phone]- Mobile [phone]- Toll Free Main Office

## 2016-03-06 ENCOUNTER — Other Ambulatory Visit: Payer: Self-pay | Admitting: *Deleted

## 2016-03-06 NOTE — Patient Outreach (Signed)
[place] [facility]) Care Management  03/06/2016  CHANTRELLE VALCARCEL Dec 28, 1955 [id number]  Late Entry for 8/1  Incoming call from Mrs.Gettinger , patient returned scheduled transition of care call from 8/1 am Mrs.Freyre reports feeling tired on today, she states she had an follow up appointment with her PCP on yesterday.   Mrs.Novitsky denies any increased shortness of breath, cough, swelling reports over the weekend she experienced a 3 pound weight gain , followed MD protocol and took Metolazone and potassium report weight back down to 211.  Patient reports blood sugar range for today was 210 in the am and 179 at lunch , patient denies any hypoglycemic episodes and she continues to follow diet as far as watching carbohydrates. Patient states MD checked her A1c at today's visit patient unsure of result, she will follow up with MD office.   Mrs.Benne denies any new concerns at this time. Patient is agreeable to home visit.  Plan Will schedule home visit in the next week as part of the transition of care program.  Joylene Draft, RN, [place] [facility] [phone]- Mobile [phone]- [place] Office

## 2016-03-08 ENCOUNTER — Ambulatory Visit: Payer: Self-pay | Admitting: *Deleted

## 2016-03-14 ENCOUNTER — Telehealth: Payer: Self-pay | Admitting: *Deleted

## 2016-03-14 ENCOUNTER — Encounter: Payer: Self-pay | Admitting: *Deleted

## 2016-03-14 ENCOUNTER — Other Ambulatory Visit: Payer: Self-pay | Admitting: *Deleted

## 2016-03-14 NOTE — Telephone Encounter (Signed)
Pt is  Scheduled for an EGD on 03/26/16 with Dr. Havery Moros. Please advise if pt may hold Eliquis and for how long. Thanks.

## 2016-03-14 NOTE — Patient Outreach (Signed)
[facility]) Care Management   03/14/2016  [name] 1955/10/10 [id number]  [name] is an 60 y.o. female  Subjective:   [name] discussed how exhausted  she has felt  lately, she states it is because her hemoglobin is 8.7 when checked on last week at PCP office. Patient discussed that she went to stay with her friend a few days so she could help take care of her and she just returned to her home on today. [name] discussed her upcoming GI test on 8/22 and pre op visit on 8/15 Patient discussed that she didn't weigh herself until today, but she hasn't gained anymore weight, she didn't take scales to her friend home. Patient states she did continue to watch her salt intake limiting it to 1200 mg a day.     Objective:  BP 108/60 (BP Location: Left Arm, Patient Position: Sitting)   Pulse 84   Resp 20   Wt 208 lb 6.4 oz (94.5 kg)   SpO2 98%   BMI 34.68 kg/m   Patient cooking dried beans in crock pot during visit. Review of Systems  HENT: Negative.   Eyes: Negative.   Respiratory: Positive for shortness of breath. Negative for cough.   Cardiovascular: Negative for leg swelling.  Gastrointestinal: Positive for constipation. Negative for heartburn and nausea.  Genitourinary: Negative.   Musculoskeletal: Negative.   Skin: Negative.   Neurological: Positive for dizziness and weakness.  Endo/Heme/Allergies: Negative.   Psychiatric/Behavioral: Positive for depression. Negative for suicidal ideas.    Physical Exam  Constitutional: She is oriented to person, place, and time. She appears well-developed and well-nourished.  Cardiovascular: Normal rate, normal heart sounds and intact distal pulses.   Respiratory: Effort normal.  GI: Soft.  Neurological: She is alert and oriented to person, place, and time.  Skin: Skin is warm and dry.  Psychiatric: She has a normal mood and affect. Her behavior is normal. Judgment and thought content  normal.    Encounter Medications:  Patient was recently discharged from hospital and all medications have been reviewed. Outpatient Encounter Prescriptions as of 03/14/2016  Medication Sig  . albuterol (PROVENTIL HFA) 108 (90 BASE) MCG/ACT inhaler Inhale 2 puffs into the lungs every 6 (six) hours as needed. For shortness of breath  . ALPRAZolam (XANAX) 0.5 MG tablet Take 0.5 mg by mouth daily as needed for anxiety.   Marland Kitchen amiodarone (PACERONE) 200 MG tablet Take 1 tablet (200 mg total) by mouth daily.  Marland Kitchen apixaban (ELIQUIS) 5 MG TABS tablet Take 1 tablet (5 mg total) by mouth 2 (two) times daily.  . busPIRone (BUSPAR) 30 MG tablet Take 30 mg by mouth 2 (two) times daily.   . carvedilol (COREG) 6.25 MG tablet Take 1 tablet (6.25 mg total) by mouth 2 (two) times daily with a meal.  . digoxin (LANOXIN) 0.125 MG tablet Take 1 tablet (0.125 mg total) by mouth every other day.  . docusate sodium (COLACE) 100 MG capsule Take 3 capsules (300 mg total) by mouth at bedtime.  . DULoxetine (CYMBALTA) 30 MG capsule Take 30 mg by mouth daily.  Marland Kitchen Fe Cbn-Fe Gluc-FA-B12-C-DSS (FERRALET 90) 90-1 MG TABS Take 1 tablet by mouth daily.  . furosemide (LASIX) 20 MG tablet Take 3 tablets (60 mg total) by mouth 2 (two) times daily. If 1 to 2 pound weight gain in a day, take extra 20 mg dose  . Garlic 109 MG CAPS Take 1 capsule by mouth daily.   . Glucosamine 500  MG CAPS Take 1 capsule by mouth daily.    Marland Kitchen HUMALOG KWIKPEN 100 UNIT/ML KiwkPen Inject 7-34 Units into the skin 3 (three) times daily. Sliding scale TID  . HUMALOG MIX 75/25 KWIKPEN (75-25) 100 UNIT/ML Kwikpen Inject 75 Units into the skin 2 (two) times daily.  . hydrOXYzine (ATARAX/VISTARIL) 50 MG tablet Take 50 mg by mouth every 6 (six) hours as needed for itching.  . ivabradine (CORLANOR) 5 MG TABS tablet Take 1 tablet (5 mg total) by mouth 2 (two) times daily with a meal.  . levothyroxine (SYNTHROID, LEVOTHROID) 75 MCG tablet Take 75 mcg by mouth daily before  breakfast.   . Melatonin 1 MG TABS Take 0.5 mg by mouth at bedtime.  . metolazone (ZAROXOLYN) 2.5 MG tablet Take 2.5 mg by mouth as needed (for weight gain >3 lbs).  . mometasone (NASONEX) 50 MCG/ACT nasal spray Place 1 spray into both nostrils daily. allergies  . Multiple Minerals (CALCIUM-MAGNESIUM-ZINC) TABS Take 1 tablet by mouth daily.   . nitroGLYCERIN (NITROSTAT) 0.4 MG SL tablet Place 1 tablet (0.4 mg total) under the tongue every 5 (five) minutes as needed. For chest pain  . pantoprazole (PROTONIX) 40 MG tablet Take 40 mg by mouth 2 (two) times daily.    . polyethylene glycol powder (GLYCOLAX/MIRALAX) powder Mix 17 grams in 8 oz of water twice daily  . potassium chloride SA (K-DUR,KLOR-CON) 20 MEQ tablet Take 3 tablets (60 mEq total) by mouth 3 (three) times daily. With an additional 40 meq on Metolazone  . pyridOXINE (VITAMIN B-6) 100 MG tablet Take 100 mg by mouth daily.  . ramipril (ALTACE) 2.5 MG capsule Take 1 capsule (2.5 mg total) by mouth daily.  Marland Kitchen rOPINIRole (REQUIP) 1 MG tablet Take 1 mg by mouth at bedtime.    . Simethicone Extra Strength 125 MG CAPS Take 1 tablet by mouth daily as needed (for bloating). Reported on 02/23/2016  . spironolactone (ALDACTONE) 25 MG tablet Take 25 mg by mouth daily.  . SUMAtriptan (IMITREX) 100 MG tablet Take 1 tablet by mouth daily as needed for migraine.   . tiotropium (SPIRIVA) 18 MCG inhalation capsule Place 18 mcg into inhaler and inhale daily.    . vitamin B-12 (CYANOCOBALAMIN) 1000 MCG tablet Take 1,000 mcg by mouth daily.  . vitamin C (ASCORBIC ACID) 500 MG tablet Take 500 mg by mouth daily.  . vitamin E 100 UNIT capsule Take 400 Units by mouth daily.   No facility-administered encounter medications on file as of 03/14/2016.     Functional Status:   In your present state of health, do you have any difficulty performing the following activities: 03/14/2016 02/24/2016  Hearing? Y N  Vision? N N  Difficulty concentrating or making  decisions? N N  Walking or climbing stairs? Y N  Dressing or bathing? N N  Doing errands, shopping? Y -  Conservation officer, nature and eating ? N -  Using the Toilet? N -  In the past six months, have you accidently leaked urine? N -  Do you have problems with loss of bowel control? N -  Managing your Medications? N -  Managing your Finances? N -  Housekeeping or managing your Housekeeping? N -  Some recent data might be hidden    Fall/Depression Screening:    PHQ 2/9 Scores 03/14/2016 01/24/2016 01/02/2016  PHQ - 2 Score 2 0 0  PHQ- 9 Score 7 - -    Assessment:  Routine home, patient lives in camper .with unstable cinder  block steps, states she has no problem with entering and exiting her home and plans to get more stable steps.  Recent GI Bleed -= Patient has complaint of increased tiredness, denies noting blood in stools, reports stool greenish discolored yesterday.  Diabetes- today blood sugar 308, unable to view average on her meter, noted ranges 180 to 300. Will benefit from continued education and management of diabetes.    Heart Failure - weight stable, patient has not had to take extra lasix in the last week. Reviewed need for  heart failure monitoring .  Positive depression screening.- Mild depression  Plan:  Will remain active with transition of care program, weekly call scheduled for next week. Placed call to  notify PCP office with concern related to patient increased complaint of tiredness and send this visit note. Patient will attend all MD appointments , and notify MD of worsening symptoms. Reviewed heart failure zones, provided EMMI handout on Diabetes controlling blood sugar.  [facility] CM Care Plan Problem One   Flowsheet Row Most Recent Value  Care Plan Problem One  HIgh Risk for hospital admission as evidenced by recent admission.  Role Documenting the Problem One  Care Management Coordinator  Care Plan for Problem One  Active  [facility] Long Term Goal (31-90 days)  Patient will  not experience a hospital readmission in the next 31 days  [facility] Long Term Goal Start Date  02/26/16  Interventions for Problem One Long Term Goal  Home visit completed, reinforced with patient to notify MD sooner of new or unrelieved symptoms   [facility] CM Short Term Goal #1 (0-30 days)  Patient will schedule and attend post hospital MD visit in the next 14 days  [facility] CM Short Term Goal #1 Start Date  02/26/16  [facility] CM Short Term Goal #1 Met Date  03/06/16  [facility] CM Short Term Goal #2 (0-30 days)  Patient will be able to verbalize at least 2 signs of GI bleed and action plan in the next 30 days   [facility] CM Short Term Goal #2 Start Date  02/26/16  [facility] CM Short Term Goal #2 Met Date  03/14/16  Interventions for Short Term Goal #2  Reinforced with patient to notify MD sooner of any signs of bleeding  [facility] CM Short Term Goal #3 (0-30 days)  Patient will be able to state at least 3 yellow zone symptoms and action plan to take in the next 30 days   [facility] CM Short Term Goal #3 Start Date  03/06/16  Interventions for Short Tern Goal #3  Reviewed yellow zone symptoms to notify MD of  [facility] CM Short Term Goal #4 (0-30 days)  Patient will continue to monitor blood sugar at 3 times a day record and notiify MD of consistent elevations in the next 30 days   [facility] CM Short Term Goal #4 Start Date  03/14/16  Interventions for Short Term Goal #4  Discussed importance of keeping a record of blood sugars , and notifying MD of consistent elevations , provided and reviewed EMMI handout on controlling blood sugar     Joylene Draft, RN, [place] [facility] [phone]- Mobile [phone]- [place]

## 2016-03-14 NOTE — Telephone Encounter (Signed)
Yes, if she's able to, per cardiology, she should hold it for 3 days prior (given GFR in 50s), as we are doing this to remove duodenal polyps which I suspect may be adenomas. Thanks

## 2016-03-14 NOTE — Telephone Encounter (Signed)
Dr Havery Moros, This lady is scheduled for a PV Tuesday 8-15 with an EGD at [facility] with you 8-22. She was admitted 7-21 with GI bleed and states she has resumed her Eliquis BID. Does she need to stop this for her procedure ?   She has not had an OV with you since her discharge.  Please advise.  Thanks, Lelan Pons PV

## 2016-03-15 NOTE — Telephone Encounter (Signed)
Ok to hold for 24 hours

## 2016-03-16 ENCOUNTER — Emergency Department (HOSPITAL_COMMUNITY)
Admission: EM | Admit: 2016-03-16 | Discharge: 2016-03-16 | Disposition: A | Discharge status: Home / Self Care | Payer: Medicare Other | Attending: Emergency Medicine | Admitting: Emergency Medicine

## 2016-03-16 ENCOUNTER — Telehealth: Payer: Self-pay | Admitting: Internal Medicine

## 2016-03-16 ENCOUNTER — Emergency Department (HOSPITAL_COMMUNITY): Payer: Medicare Other

## 2016-03-16 ENCOUNTER — Encounter (HOSPITAL_COMMUNITY): Payer: Self-pay

## 2016-03-16 ENCOUNTER — Other Ambulatory Visit: Payer: Self-pay | Admitting: Internal Medicine

## 2016-03-16 DIAGNOSIS — R195 Other fecal abnormalities: Secondary | ICD-10-CM | POA: Diagnosis not present

## 2016-03-16 DIAGNOSIS — I2510 Atherosclerotic heart disease of native coronary artery without angina pectoris: Secondary | ICD-10-CM | POA: Diagnosis not present

## 2016-03-16 DIAGNOSIS — I110 Hypertensive heart disease with heart failure: Secondary | ICD-10-CM | POA: Insufficient documentation

## 2016-03-16 DIAGNOSIS — E039 Hypothyroidism, unspecified: Secondary | ICD-10-CM | POA: Diagnosis not present

## 2016-03-16 DIAGNOSIS — Z87891 Personal history of nicotine dependence: Secondary | ICD-10-CM | POA: Insufficient documentation

## 2016-03-16 DIAGNOSIS — Z95810 Presence of automatic (implantable) cardiac defibrillator: Secondary | ICD-10-CM | POA: Insufficient documentation

## 2016-03-16 DIAGNOSIS — Z853 Personal history of malignant neoplasm of breast: Secondary | ICD-10-CM | POA: Insufficient documentation

## 2016-03-16 DIAGNOSIS — Z7901 Long term (current) use of anticoagulants: Secondary | ICD-10-CM | POA: Insufficient documentation

## 2016-03-16 DIAGNOSIS — E1049 Type 1 diabetes mellitus with other diabetic neurological complication: Secondary | ICD-10-CM | POA: Insufficient documentation

## 2016-03-16 DIAGNOSIS — J449 Chronic obstructive pulmonary disease, unspecified: Secondary | ICD-10-CM | POA: Insufficient documentation

## 2016-03-16 DIAGNOSIS — I5022 Chronic systolic (congestive) heart failure: Secondary | ICD-10-CM | POA: Diagnosis not present

## 2016-03-16 DIAGNOSIS — K921 Melena: Secondary | ICD-10-CM | POA: Diagnosis not present

## 2016-03-16 DIAGNOSIS — R000 Tachycardia, unspecified: Secondary | ICD-10-CM | POA: Diagnosis not present

## 2016-03-16 DIAGNOSIS — K922 Gastrointestinal hemorrhage, unspecified: Secondary | ICD-10-CM | POA: Diagnosis present

## 2016-03-16 DIAGNOSIS — R0602 Shortness of breath: Secondary | ICD-10-CM | POA: Diagnosis not present

## 2016-03-16 LAB — COMPREHENSIVE METABOLIC PANEL
ALBUMIN: 3.4 g/dL — AB (ref 3.5–5.0)
ALT: 18 U/L (ref 14–54)
ANION GAP: 7 (ref 5–15)
AST: 21 U/L (ref 15–41)
Alkaline Phosphatase: 67 U/L (ref 38–126)
BILIRUBIN TOTAL: 0.7 mg/dL (ref 0.3–1.2)
BUN: 14 mg/dL (ref 6–20)
CO2: 30 mmol/L (ref 22–32)
Calcium: 9.7 mg/dL (ref 8.9–10.3)
Chloride: 97 mmol/L — ABNORMAL LOW (ref 101–111)
Creatinine, Ser: 0.99 mg/dL (ref 0.44–1.00)
GFR calc Af Amer: >60 mL/min (ref >60)
GFR calc non Af Amer: >60 mL/min (ref >60)
GLUCOSE: 174 mg/dL — AB (ref 65–99)
POTASSIUM: 3.7 mmol/L (ref 3.5–5.1)
Sodium: 134 mmol/L — ABNORMAL LOW (ref 135–145)
TOTAL PROTEIN: 7.1 g/dL (ref 6.5–8.1)

## 2016-03-16 LAB — CBC
HEMATOCRIT: 33.5 % — AB (ref 36.0–46.0)
HEMOGLOBIN: 10 g/dL — AB (ref 12.0–15.0)
MCH: 25.4 pg — ABNORMAL LOW (ref 26.0–34.0)
MCHC: 29.9 g/dL — ABNORMAL LOW (ref 30.0–36.0)
MCV: 85 fL (ref 78.0–100.0)
Platelets: 392 10*3/uL (ref 150–400)
RBC: 3.94 MIL/uL (ref 3.87–5.11)
RDW: 22.7 % — ABNORMAL HIGH (ref 11.5–15.5)
WBC: 8.6 10*3/uL (ref 4.0–10.5)

## 2016-03-16 LAB — TYPE AND SCREEN
ABO/RH(D): A POS
ANTIBODY SCREEN: NEGATIVE

## 2016-03-16 LAB — POC OCCULT BLOOD, ED: Fecal Occult Bld: POSITIVE — AB

## 2016-03-16 NOTE — Progress Notes (Signed)
Spoke to ED Dr. Ayesha Rumpf - patient with no stool in vault but heme + mucous Hgb  Better than last BP ok sxs overall seem stable  Plan is for no Eliquis and get CBC at our office 8/14 - call back sooner prn - unless patient elects observation

## 2016-03-16 NOTE — Discharge Instructions (Signed)
Do not take your Eliquis this weekend - contact Dr. Havery Moros on Monday to see when you can restart the medication.  Get rechecked immediately if you develop worsening shortness of breath, weakness, increased bleeding or new concerning symptoms.

## 2016-03-16 NOTE — ED Triage Notes (Signed)
Patient here with 1 episode of dark stools last night. Complains of fatigue and weakness with same. Had recent admission for GI bleed. Patient pale on arival, denies pain. Alert and oriented

## 2016-03-16 NOTE — Telephone Encounter (Signed)
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

## 2016-03-16 NOTE — ED Provider Notes (Signed)
[facility] DEPT Provider Note   CSN: [id number] Arrival date & time: 03/16/16  1036  First Provider Contact:  First MD Initiated Contact with Patient 03/16/16 1043        History   Chief Complaint Chief Complaint  Patient presents with  . GI Bleeding    HPI [name] is a 60 y.o. female.  The history is provided by the patient. No language interpreter was used.    [name] is a 60 y.o. female who presents to the Emergency Department complaining of GI Bleed.  She has a history of atrial fibrillation on Eliquis as well as recurrent GI bleeds. She was recently discharged from the hospital a few weeks ago for a GI bleed. Since discharge time she has reported persistent intermittent dizziness.  She endorses associated progressive shortness of breath, fatigue and dyspnea on exertion. Last night she had one black stool that appeared similar to her prior GI bleeds. She is concerned for recurrent GI bleed. She has intermittent abdominal discomfort that is unchanged from baseline and not currently present. No nausea, vomiting, fever. She sees Dr. Havery Moros with [facility]  Past Medical History:  Diagnosis Date  . AICD (automatic cardioverter/defibrillator) present   . Anxiety   . 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 Asthma   . CHF (congestive heart failure) ([place])   . COPD (chronic obstructive pulmonary disease) ([place])   . Depression    followed by a psychiatrist  . DYSLIPIDEMIA 05/01/2009  . Gastroparesis   . GERD (gastroesophageal reflux disease)   . History of breast cancer 2006   with return in 2008, s/p mastectomy, XRT and chemotherapy  . Hypertension   . 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 IBS (irritable bowel syndrome)   . ICD (implantable cardiac defibrillator) in place 11/10/2012   ICD (11/2012)  . Myocardial infarction ([place])   . OBESITY 07/24/2007  . OBSTRUCTIVE SLEEP APNEA 07/24/2007   with CPAP compliance  . Osteoarthritis   . Persistent atrial fibrillation ([place])  09/2014  . Presence of permanent cardiac pacemaker   . RESTLESS LEG SYNDROME 07/24/2007  . SYSTOLIC HEART FAILURE, CHRONIC 01/05/2009   a. chemo induced cardiomyopathy b. cMRI (02/2009) EF 45% c. Myoview 05/2010: EF 59%, no ischemia d. RHC (01/2012) RA 20, RV 55/13/22, PA 56/34 (44), PCWP 28, Fick CO/CI: 3.1 / 1.5, PVR 5.3 WU, PA sat 42% & 48% e. ECHO (10/2012) EF 20-25%, diff HK, MV calcified annulus f. ECHO (03/2014) EF 30-35%, sev HK, inferior/inferoseptal walls, mild MR    Patient Active Problem List   Diagnosis Date Noted  . CHF (congestive heart failure) ([place]) 12/21/2015  . Acute blood loss anemia   . Atrial fibrillation ([place]) 12/11/2015  . Benign neoplasm of descending colon   . Upper GI bleeding   . Iron deficiency anemia due to chronic blood loss   . Acute upper GI bleeding 11/14/2015  . Anemia 11/14/2015  . Absolute anemia   . Symptomatic anemia   . Bleeding gastrointestinal   . Chronic systolic CHF (congestive heart failure), NYHA class 2 ([place])   . PAF (paroxysmal atrial fibrillation) ([place]) 09/22/2015  . Chest pain at rest 08/19/2015  . Chest pain 08/19/2015  . NSVT (nonsustained ventricular tachycardia) ([place]) 06/30/2015  . Breast cancer of lower-outer quadrant of right female breast ([place]) 07/22/2014  . CAD (coronary artery disease) 02/17/2014  . 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 PALPITATIONS 09/20/2010  . Mixed hypercholesterolemia and hypertriglyceridemia 05/01/2009  . HYPERTENSION, BENIGN 01/05/2009  .  FOOT PAIN 11/29/2008  . Sinus tachycardia ([place]) 10/25/2008  . 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 Obesity 07/24/2007  . Obstructive sleep apnea 07/24/2007  . RESTLESS LEG SYNDROME 07/24/2007    Past Surgical History:  Procedure Laterality Date  . ABDOMINAL HYSTERECTOMY  1991  . BI-VENTRICULAR IMPLANTABLE CARDIOVERTER DEFIBRILLATOR N/A 11/10/2012   SJM Forify Assura single chamber ICD  . CARDIAC  CATHETERIZATION N/A 08/21/2015   Procedure: Right/Left Heart Cath and Coronary Angiography;  Surgeon: Jolaine Artist, MD;  Location: [facility];  Service: Cardiovascular;  Laterality: N/A;  . CARDIAC DEFIBRILLATOR PLACEMENT  11/10/2012   SJM Fortify Assura VR implanted by Dr Rayann Heman for primary prevention  . CHOLECYSTECTOMY    . COLONOSCOPY N/A 11/17/2015   Procedure: COLONOSCOPY;  Surgeon: Jerene Bears, MD;  Location: [facility] ENDOSCOPY;  Service: Endoscopy;  Laterality: N/A;  . ESOPHAGOGASTRODUODENOSCOPY N/A 11/16/2015   Procedure: ESOPHAGOGASTRODUODENOSCOPY (EGD);  Surgeon: Manus Gunning, MD;  Location: [facility];  Service: Gastroenterology;  Laterality: N/A;  . ESOPHAGOGASTRODUODENOSCOPY N/A 02/24/2016   Procedure: ESOPHAGOGASTRODUODENOSCOPY (EGD);  Surgeon: Milus Banister, MD;  Location: [facility];  Service: Endoscopy;  Laterality: N/A;  . GIVENS CAPSULE STUDY N/A 12/11/2015   Procedure: GIVENS CAPSULE STUDY;  Surgeon: Manus Gunning, MD;  Location: [facility];  Service: Gastroenterology;  Laterality: N/A;  . KNEE CARTILAGE SURGERY    . MASTECTOMY     right  . WRIST SURGERY Bilateral     OB History    No data available       Home Medications    Prior to Admission medications   Medication Sig Start Date End Date Taking? Authorizing Provider  albuterol (PROVENTIL HFA) 108 (90 BASE) MCG/ACT inhaler Inhale 2 puffs into the lungs every 6 (six) hours as needed. For shortness of breath   Yes Historical Provider, MD  ALPRAZolam Duanne Moron) 0.5 MG tablet Take 0.5 mg by mouth daily as needed for anxiety.  11/06/15  Yes Historical Provider, MD  amiodarone (PACERONE) 200 MG tablet Take 1 tablet (200 mg total) by mouth daily. 09/05/15  Yes Amy D Clegg, NP  apixaban (ELIQUIS) 5 MG TABS tablet Take 1 tablet (5 mg total) by mouth 2 (two) times daily. 02/26/16  Yes Cherene Altes, MD  busPIRone (BUSPAR) 30 MG tablet Take 30 mg by mouth 2 (two) times daily.    Yes Historical  Provider, MD  carvedilol (COREG) 6.25 MG tablet Take 1 tablet (6.25 mg total) by mouth 2 (two) times daily with a meal. 11/18/15  Yes Mercy Riding, MD  digoxin (LANOXIN) 0.125 MG tablet Take 1 tablet (0.125 mg total) by mouth every other day. 06/28/15  Yes Jolaine Artist, MD  docusate sodium (COLACE) 100 MG capsule Take 3 capsules (300 mg total) by mouth at bedtime. 10/03/15  Yes Amy D Clegg, NP  DULoxetine (CYMBALTA) 30 MG capsule Take 30 mg by mouth daily.   Yes Amy A Moon, NP  Fe Cbn-Fe Gluc-FA-B12-C-DSS (FERRALET 90) 90-1 MG TABS Take 1 tablet by mouth daily. 02/25/16  Yes Cherene Altes, MD  furosemide (LASIX) 20 MG tablet Take 3 tablets (60 mg total) by mouth 2 (two) times daily. If 1 to 2 pound weight gain in a day, take extra 20 mg dose 02/25/16  Yes Cherene Altes, MD  Garlic [id number] MG CAPS Take 500 mg by mouth daily.    Yes Historical Provider, MD  Glucosamine 500 MG CAPS Take 500 mg by mouth daily.  Yes Historical Provider, MD  HUMALOG KWIKPEN 100 UNIT/ML KiwkPen Inject 7-34 Units into the skin 3 (three) times daily. Sliding scale TID 10/30/15  Yes Historical Provider, MD  HUMALOG MIX 75/25 KWIKPEN (75-25) 100 UNIT/ML Kwikpen Inject 75 Units into the skin 2 (two) times daily. 11/21/15  Yes Historical Provider, MD  hydrOXYzine (ATARAX/VISTARIL) 50 MG tablet Take 50 mg by mouth every 6 (six) hours as needed for itching.   Yes Historical Provider, MD  ivabradine (CORLANOR) 5 MG TABS tablet Take 1 tablet (5 mg total) by mouth 2 (two) times daily with a meal. 02/09/16  Yes Amy D Clegg, NP  levothyroxine (SYNTHROID, LEVOTHROID) 75 MCG tablet Take 75 mcg by mouth daily before breakfast.    Yes Historical Provider, MD  Melatonin 1 MG TABS Take 0.5 mg by mouth at bedtime.   Yes Historical Provider, MD  metolazone (ZAROXOLYN) 2.5 MG tablet Take 2.5 mg by mouth as needed (for weight gain >3 lbs).   Yes Historical Provider, MD  mometasone (NASONEX) 50 MCG/ACT nasal spray Place 1 spray into both  nostrils daily. allergies   Yes Historical Provider, MD  Multiple Minerals (CALCIUM-MAGNESIUM-ZINC) TABS Take 1 tablet by mouth daily.    Yes Historical Provider, MD  pantoprazole (PROTONIX) 40 MG tablet Take 40 mg by mouth 2 (two) times daily.     Yes Historical Provider, MD  polyethylene glycol powder (GLYCOLAX/MIRALAX) powder Mix 17 grams in 8 oz of water twice daily 01/05/16  Yes Manus Gunning, MD  potassium chloride SA (K-DUR,KLOR-CON) 20 MEQ tablet Take 3 tablets (60 mEq total) by mouth 3 (three) times daily. With an additional 40 meq on Metolazone 02/01/16  Yes Shirley Friar, PA-C  pyridOXINE (VITAMIN B-6) 100 MG tablet Take 100 mg by mouth daily.   Yes Historical Provider, MD  ramipril (ALTACE) 2.5 MG capsule Take 1 capsule (2.5 mg total) by mouth daily. 02/01/16  Yes Shirley Friar, PA-C  rOPINIRole (REQUIP) 1 MG tablet Take 1 mg by mouth at bedtime.     Yes Historical Provider, MD  Simethicone Extra Strength 125 MG CAPS Take 1 tablet by mouth daily as needed (for bloating). Reported on 02/23/2016   Yes Historical Provider, MD  spironolactone (ALDACTONE) 25 MG tablet Take 25 mg by mouth daily.   Yes Historical Provider, MD  SUMAtriptan (IMITREX) 100 MG tablet Take 1 tablet by mouth daily as needed for migraine.  11/28/15  Yes Historical Provider, MD  tiotropium (SPIRIVA) 18 MCG inhalation capsule Place 18 mcg into inhaler and inhale daily.     Yes Historical Provider, MD  vitamin B-12 (CYANOCOBALAMIN) 1000 MCG tablet Take 1,000 mcg by mouth daily.   Yes Historical Provider, MD  vitamin C (ASCORBIC ACID) 500 MG tablet Take 500 mg by mouth daily.   Yes Historical Provider, MD  vitamin E 100 UNIT capsule Take 400 Units by mouth daily.   Yes Historical Provider, MD  nitroGLYCERIN (NITROSTAT) 0.4 MG SL tablet Place 1 tablet (0.4 mg total) under the tongue every 5 (five) minutes as needed. For chest pain 10/18/15   Jolaine Artist, MD    Family History Family History    Problem Relation Age of Onset  . Diabetes Mellitus II Mother   . Lung cancer Mother   . Congestive Heart Failure Mother   . Coronary artery disease Father     CABG x 3  . Hypertension Father   . Diabetes Mellitus II Father   . Breast cancer Cousin   .  Breast cancer Cousin   . Uterine cancer Cousin     Social History Social History  Substance Use Topics  . Smoking status: Former Smoker    Quit date: 04/12/2001  . Smokeless tobacco: Never Used  . Alcohol use No     Allergies   Bee venom; Aspirin; Ciprofloxacin; Codeine; Naproxen; Other; Sulfonamide derivatives; Xarelto [rivaroxaban]; Atorvastatin; Nsaids; and Tape   Review of Systems Review of Systems  All other systems reviewed and are negative.    Physical Exam Updated Vital Signs BP 123/64   Pulse 64   Resp 17   SpO2 94%   Physical Exam  Constitutional: She is oriented to person, place, and time. She appears well-developed and well-nourished.  HENT:  Head: Normocephalic and atraumatic.  Cardiovascular: Regular rhythm.   No murmur heard. tachycardic  Pulmonary/Chest: Effort normal and breath sounds normal. No respiratory distress.  Abdominal: Soft. There is no tenderness. There is no rebound and no guarding.  Genitourinary:  Genitourinary Comments: nonthrombosed external hemorrhoids, empty rectal vault  Musculoskeletal: She exhibits no edema or tenderness.  Neurological: She is alert and oriented to person, place, and time.  Skin: Skin is warm and dry. There is pallor.  Psychiatric: She has a normal mood and affect. Her behavior is normal.  Nursing note and vitals reviewed.    ED Treatments / Results  Labs (all labs ordered are listed, but only abnormal results are displayed) Labs Reviewed  COMPREHENSIVE METABOLIC PANEL - Abnormal; Notable for the following:       Result Value   Sodium 134 (*)    Chloride 97 (*)    Glucose, Bld 174 (*)    Albumin 3.4 (*)    All other components within normal limits   CBC - Abnormal; Notable for the following:    Hemoglobin 10.0 (*)    HCT 33.5 (*)    MCH 25.4 (*)    MCHC 29.9 (*)    RDW 22.7 (*)    All other components within normal limits  POC OCCULT BLOOD, ED - Abnormal; Notable for the following:    Fecal Occult Bld POSITIVE (*)    All other components within normal limits  TYPE AND SCREEN    EKG  EKG Interpretation  Date/Time:  Saturday March 16 2016 11:15:03 EDT Ventricular Rate:  94 PR Interval:    QRS Duration: 146 QT Interval:  391 QTC Calculation: 489 R Axis:   -95 Text Interpretation:  Sinus rhythm Prolonged PR interval IVCD, consider atypical RBBB Anterior infarct, old Confirmed by Hazle Coca [phone]) on 03/16/2016 11:33:42 AM       Radiology Dg Chest 2 View  Result Date: 03/16/2016 CLINICAL DATA:  Shortness of breath, atrial fibrillation, coronary artery disease post MI x 2, CHF, breast cancer post RIGHT mastectomy, hypertension, diabetes mellitus, COPD, asthma EXAM: CHEST  2 VIEW COMPARISON:  12/20/2015 FINDINGS: LEFT subclavian transvenous AICD lead projects at RIGHT ventricle. Enlargement of cardiac silhouette with pulmonary vascular congestion. Atherosclerotic calcification aorta. Diffuse interstitial infiltrates likely representing mild pulmonary edema. Underlying emphysematous changes. No segmental consolidation, pleural effusion or pneumothorax. Bones demineralized. IMPRESSION: Mild CHF superimposed upon a background of COPD changes. Aortic atherosclerosis. Electronically Signed   By: Lavonia Dana M.D.   On: 03/16/2016 11:55    Procedures Procedures (including critical care time)  Medications Ordered in ED Medications - No data to display   Initial Impression / Assessment and Plan / ED Course  I have reviewed the triage vital signs and the nursing  notes.  Pertinent labs & imaging results that were available during my care of the patient were reviewed by me and considered in my medical decision making (see chart for  details).  Clinical Course    Patient with history of A. fib on Eliquis and recent GI bleed here following an episode of melanotic stool at home and concern for recurrent bleed. She has an empty rectal vault that is guaiac positive. Hemoglobin has improved  from prior of 7 up to 10 today. She is in no distress in the emergency department and hemodynamically stable. Discussed the case with Dr. Carlean Purl with [facility]. Given patient's improved labs and reassuring exam plan to DC home with discontinuing the Eliquis and repeat labs in the office on Monday. Discussed very close return precautions for any recurrent or worsening symptoms as well as home care. Patient and daughter are in agreement with plan.  Final Clinical Impressions(s) / ED Diagnoses   Final diagnoses:  Heme positive stool    New Prescriptions Discharge Medication List as of 03/16/2016 12:33 PM       Quintella Reichert, MD 03/16/16 1510

## 2016-03-18 ENCOUNTER — Encounter: Payer: Self-pay | Admitting: *Deleted

## 2016-03-18 NOTE — Telephone Encounter (Signed)
This encounter was created in error - please disregard.

## 2016-03-19 ENCOUNTER — Ambulatory Visit (AMBULATORY_SURGERY_CENTER): Payer: Self-pay

## 2016-03-19 ENCOUNTER — Ambulatory Visit: Payer: Medicare Other | Admitting: *Deleted

## 2016-03-19 VITALS — Ht 65.0 in | Wt 214.8 lb

## 2016-03-19 DIAGNOSIS — K317 Polyp of stomach and duodenum: Secondary | ICD-10-CM

## 2016-03-19 NOTE — Telephone Encounter (Signed)
See ER note from 8/12, appears pt was sent home and eliquis was discontinued. Dr. Havery Moros is out of office all week. Need to see if pt even taking Eliquis when she comes in for Previsit.

## 2016-03-19 NOTE — Telephone Encounter (Signed)
Dr Havery Moros,      Last response from cardiology is to hold Eliquis for 24 hours.  Based on your note 3 day hold is needed.  This pt is coming in today.  Need clarification as to how to instruct pt.                                                                          Thank you,                                                                               Levada Dy

## 2016-03-19 NOTE — Progress Notes (Signed)
No allergies to eggs or soy No past problems with anesthesia No diet meds No home oxygen  No internet 

## 2016-03-19 NOTE — Telephone Encounter (Signed)
If she is continuing to take Eliquis, then she will need to hold it 2 days - this is based on her most recent renal function test and current ASGE practice guidelines on holding anticoagulation prior to high risk bleeding procedures. My last recommendation had been based of GFR in the 50s on her prior renal function, which would have warranted a 3 day hold. If she is no longer on Eliquis then this is not an issue. Thanks

## 2016-03-19 NOTE — Telephone Encounter (Signed)
See Dr. Ozella Rocks comments below.

## 2016-03-20 ENCOUNTER — Encounter (HOSPITAL_COMMUNITY): Payer: Self-pay | Admitting: *Deleted

## 2016-03-20 DIAGNOSIS — K921 Melena: Secondary | ICD-10-CM | POA: Diagnosis not present

## 2016-03-20 DIAGNOSIS — E1140 Type 2 diabetes mellitus with diabetic neuropathy, unspecified: Secondary | ICD-10-CM | POA: Diagnosis not present

## 2016-03-20 DIAGNOSIS — K922 Gastrointestinal hemorrhage, unspecified: Secondary | ICD-10-CM | POA: Diagnosis not present

## 2016-03-20 DIAGNOSIS — D62 Acute posthemorrhagic anemia: Secondary | ICD-10-CM | POA: Diagnosis not present

## 2016-03-20 DIAGNOSIS — I10 Essential (primary) hypertension: Secondary | ICD-10-CM | POA: Diagnosis not present

## 2016-03-21 ENCOUNTER — Other Ambulatory Visit: Payer: Self-pay | Admitting: *Deleted

## 2016-03-21 NOTE — Patient Outreach (Signed)
[facility]) Care Management  03/21/2016  [name] [birth year] [id number]   Transition of care call  Spoke with [name] she reports that she is feeling much better, stronger, feeling back to self, patient discussed that is was able to drive herself to the store the other day.   [name] discussed her recent ED visit due to seeing blood in her stool, she stayed with her sister for a few days after that.Patient reports that her Eliquis was discontinued after that and she visited her PCP on yesterday and MD will continue to hold Eliquis until after her GI procedure on 8/22 and that MD faxed that information to Dr.Allred.  Patient denies seeing blood in stool, she states her stools are normal colored.  Blood sugar range in the 200's and her today's weight is stable at 208.  Encouraged patient to notify MD of increases in shortness of breath, swelling or sudden weight gain. Patient plans to stay with her sister a few days after her procedure on next week.  Plan Will follow up with patient on next week for transition of care.    Joylene Draft, RN, [facility] [phone]- Mobile [phone]- Toll Free Main Office

## 2016-03-26 ENCOUNTER — Ambulatory Visit (HOSPITAL_COMMUNITY): Payer: Medicare Other | Admitting: Anesthesiology

## 2016-03-26 ENCOUNTER — Encounter (HOSPITAL_COMMUNITY)
Admission: RE | Disposition: A | Discharge status: Home / Self Care | Payer: Self-pay | Source: Ambulatory Visit | Attending: Gastroenterology

## 2016-03-26 ENCOUNTER — Ambulatory Visit (HOSPITAL_COMMUNITY)
Admission: RE | Admit: 2016-03-26 | Discharge: 2016-03-26 | Disposition: A | Discharge status: Home / Self Care | Payer: Medicare Other | Source: Ambulatory Visit | Attending: Gastroenterology | Admitting: Gastroenterology

## 2016-03-26 ENCOUNTER — Encounter (HOSPITAL_COMMUNITY): Payer: Self-pay | Admitting: *Deleted

## 2016-03-26 DIAGNOSIS — Z923 Personal history of irradiation: Secondary | ICD-10-CM | POA: Insufficient documentation

## 2016-03-26 DIAGNOSIS — I2510 Atherosclerotic heart disease of native coronary artery without angina pectoris: Secondary | ICD-10-CM | POA: Diagnosis not present

## 2016-03-26 DIAGNOSIS — Z6835 Body mass index (BMI) 35.0-35.9, adult: Secondary | ICD-10-CM | POA: Diagnosis not present

## 2016-03-26 DIAGNOSIS — K317 Polyp of stomach and duodenum: Secondary | ICD-10-CM | POA: Insufficient documentation

## 2016-03-26 DIAGNOSIS — I110 Hypertensive heart disease with heart failure: Secondary | ICD-10-CM | POA: Diagnosis not present

## 2016-03-26 DIAGNOSIS — G4733 Obstructive sleep apnea (adult) (pediatric): Secondary | ICD-10-CM | POA: Insufficient documentation

## 2016-03-26 DIAGNOSIS — D649 Anemia, unspecified: Secondary | ICD-10-CM | POA: Diagnosis not present

## 2016-03-26 DIAGNOSIS — K6389 Other specified diseases of intestine: Secondary | ICD-10-CM

## 2016-03-26 DIAGNOSIS — E669 Obesity, unspecified: Secondary | ICD-10-CM | POA: Insufficient documentation

## 2016-03-26 DIAGNOSIS — F419 Anxiety disorder, unspecified: Secondary | ICD-10-CM | POA: Diagnosis not present

## 2016-03-26 DIAGNOSIS — Z794 Long term (current) use of insulin: Secondary | ICD-10-CM | POA: Diagnosis not present

## 2016-03-26 DIAGNOSIS — J449 Chronic obstructive pulmonary disease, unspecified: Secondary | ICD-10-CM | POA: Insufficient documentation

## 2016-03-26 DIAGNOSIS — E1143 Type 2 diabetes mellitus with diabetic autonomic (poly)neuropathy: Secondary | ICD-10-CM | POA: Insufficient documentation

## 2016-03-26 DIAGNOSIS — I481 Persistent atrial fibrillation: Secondary | ICD-10-CM | POA: Diagnosis not present

## 2016-03-26 DIAGNOSIS — E032 Hypothyroidism due to medicaments and other exogenous substances: Secondary | ICD-10-CM | POA: Diagnosis not present

## 2016-03-26 DIAGNOSIS — G2581 Restless legs syndrome: Secondary | ICD-10-CM | POA: Insufficient documentation

## 2016-03-26 DIAGNOSIS — I5022 Chronic systolic (congestive) heart failure: Secondary | ICD-10-CM | POA: Diagnosis not present

## 2016-03-26 DIAGNOSIS — F329 Major depressive disorder, single episode, unspecified: Secondary | ICD-10-CM | POA: Diagnosis not present

## 2016-03-26 DIAGNOSIS — I427 Cardiomyopathy due to drug and external agent: Secondary | ICD-10-CM | POA: Diagnosis not present

## 2016-03-26 DIAGNOSIS — Z9049 Acquired absence of other specified parts of digestive tract: Secondary | ICD-10-CM | POA: Diagnosis not present

## 2016-03-26 DIAGNOSIS — Z9011 Acquired absence of right breast and nipple: Secondary | ICD-10-CM | POA: Diagnosis not present

## 2016-03-26 DIAGNOSIS — K2970 Gastritis, unspecified, without bleeding: Secondary | ICD-10-CM | POA: Diagnosis not present

## 2016-03-26 DIAGNOSIS — K3184 Gastroparesis: Secondary | ICD-10-CM | POA: Diagnosis not present

## 2016-03-26 DIAGNOSIS — Z853 Personal history of malignant neoplasm of breast: Secondary | ICD-10-CM | POA: Diagnosis not present

## 2016-03-26 DIAGNOSIS — Z9221 Personal history of antineoplastic chemotherapy: Secondary | ICD-10-CM | POA: Insufficient documentation

## 2016-03-26 DIAGNOSIS — Z79899 Other long term (current) drug therapy: Secondary | ICD-10-CM | POA: Insufficient documentation

## 2016-03-26 DIAGNOSIS — I10 Essential (primary) hypertension: Secondary | ICD-10-CM | POA: Diagnosis not present

## 2016-03-26 DIAGNOSIS — I252 Old myocardial infarction: Secondary | ICD-10-CM | POA: Diagnosis not present

## 2016-03-26 DIAGNOSIS — Z87891 Personal history of nicotine dependence: Secondary | ICD-10-CM | POA: Insufficient documentation

## 2016-03-26 DIAGNOSIS — Z95810 Presence of automatic (implantable) cardiac defibrillator: Secondary | ICD-10-CM | POA: Insufficient documentation

## 2016-03-26 DIAGNOSIS — K219 Gastro-esophageal reflux disease without esophagitis: Secondary | ICD-10-CM | POA: Insufficient documentation

## 2016-03-26 DIAGNOSIS — K635 Polyp of colon: Secondary | ICD-10-CM | POA: Diagnosis not present

## 2016-03-26 DIAGNOSIS — T451X5A Adverse effect of antineoplastic and immunosuppressive drugs, initial encounter: Secondary | ICD-10-CM | POA: Insufficient documentation

## 2016-03-26 HISTORY — PX: ESOPHAGOGASTRODUODENOSCOPY: SHX5428

## 2016-03-26 HISTORY — PX: ENTEROSCOPY: SHX5533

## 2016-03-26 LAB — GLUCOSE, CAPILLARY: GLUCOSE-CAPILLARY: 172 mg/dL — AB (ref 65–99)

## 2016-03-26 SURGERY — ENTEROSCOPY
Anesthesia: Monitor Anesthesia Care

## 2016-03-26 MED ORDER — SODIUM CHLORIDE 0.9 % IV SOLN: INTRAVENOUS | Status: DC

## 2016-03-26 MED ORDER — LACTATED RINGERS IV SOLN
INTRAVENOUS | Status: DC
  Administered 2016-03-26: 1000 mL via INTRAVENOUS

## 2016-03-26 MED ORDER — LIDOCAINE HCL (CARDIAC) 20 MG/ML IV SOLN
INTRAVENOUS | Status: AC
  Filled 2016-03-26: qty 5

## 2016-03-26 MED ORDER — LIDOCAINE HCL (CARDIAC) 20 MG/ML IV SOLN
INTRAVENOUS | Status: DC | PRN
  Administered 2016-03-26: 40 mg via INTRATRACHEAL

## 2016-03-26 MED ORDER — PROPOFOL 500 MG/50ML IV EMUL
INTRAVENOUS | Status: DC | PRN
  Administered 2016-03-26: 100 ug/kg/min via INTRAVENOUS

## 2016-03-26 MED ORDER — PROPOFOL 10 MG/ML IV BOLUS
INTRAVENOUS | Status: AC
  Filled 2016-03-26: qty 40

## 2016-03-26 MED ORDER — PROPOFOL 500 MG/50ML IV EMUL
INTRAVENOUS | Status: DC | PRN
  Administered 2016-03-26: 50 mg via INTRAVENOUS

## 2016-03-26 NOTE — H&P (Signed)
HPI :  60 y/o female with comorbidities as below, history of AF, CHF, AICD placement who has had previous GI bleeding on Eliquis. Endoscopic workup eventually led to finding of bleeding small bowel AVM treated by Dr. Ardis Hughs. She incidentally has been noted to have duodenal bulb polyps, here for repeat EGD for polypectomy, to rule out adenomas. She has self stopped Eliquis due to recurrent bleeding, no current anticoagulation and Hgb is improved, in 10s.   Past Medical History:  Diagnosis Date  . AICD (automatic cardioverter/defibrillator) present   . Anxiety   . 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 Asthma   . CHF (congestive heart failure) ([place])   . COPD (chronic obstructive pulmonary disease) ([place])   . Depression    followed by a psychiatrist  . DYSLIPIDEMIA 05/01/2009  . Gastroparesis   . GERD (gastroesophageal reflux disease)   . History of breast cancer 2006   with return in 2008, s/p mastectomy, XRT and chemotherapy  . Hypertension   . 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 IBS (irritable bowel syndrome)   . ICD (implantable cardiac defibrillator) in place 11/10/2012   ICD (11/2012)  . Myocardial infarction ([place])   . OBESITY 07/24/2007  . OBSTRUCTIVE SLEEP APNEA 07/24/2007   with CPAP compliance  . Osteoarthritis   . Persistent atrial fibrillation ([place]) 09/2014  . Presence of permanent cardiac pacemaker   . RESTLESS LEG SYNDROME 07/24/2007  . SYSTOLIC HEART FAILURE, CHRONIC 01/05/2009   a. chemo induced cardiomyopathy b. cMRI (02/2009) EF 45% c. Myoview 05/2010: EF 59%, no ischemia d. RHC (01/2012) RA 20, RV 55/13/22, PA 56/34 (44), PCWP 28, Fick CO/CI: 3.1 / 1.5, PVR 5.3 WU, PA sat 42% & 48% e. ECHO (10/2012) EF 20-25%, diff HK, MV calcified annulus f. ECHO (03/2014) EF 30-35%, sev HK, inferior/inferoseptal walls, mild MR     Past Surgical History:  Procedure Laterality Date  . ABDOMINAL HYSTERECTOMY  1991  . BI-VENTRICULAR IMPLANTABLE CARDIOVERTER DEFIBRILLATOR N/A 11/10/2012   SJM Forify Assura  single chamber ICD  . CARDIAC CATHETERIZATION N/A 08/21/2015   Procedure: Right/Left Heart Cath and Coronary Angiography;  Surgeon: Jolaine Artist, MD;  Location: [facility];  Service: Cardiovascular;  Laterality: N/A;  . CARDIAC DEFIBRILLATOR PLACEMENT  11/10/2012   SJM Fortify Assura VR implanted by Dr Rayann Heman for primary prevention  . CHOLECYSTECTOMY    . COLONOSCOPY N/A 11/17/2015   Procedure: COLONOSCOPY;  Surgeon: Jerene Bears, MD;  Location: [facility] ENDOSCOPY;  Service: Endoscopy;  Laterality: N/A;  . ESOPHAGOGASTRODUODENOSCOPY N/A 11/16/2015   Procedure: ESOPHAGOGASTRODUODENOSCOPY (EGD);  Surgeon: Manus Gunning, MD;  Location: [facility];  Service: Gastroenterology;  Laterality: N/A;  . ESOPHAGOGASTRODUODENOSCOPY N/A 02/24/2016   Procedure: ESOPHAGOGASTRODUODENOSCOPY (EGD);  Surgeon: Milus Banister, MD;  Location: [facility];  Service: Endoscopy;  Laterality: N/A;  . GIVENS CAPSULE STUDY N/A 12/11/2015   Procedure: GIVENS CAPSULE STUDY;  Surgeon: Manus Gunning, MD;  Location: [facility];  Service: Gastroenterology;  Laterality: N/A;  . KNEE CARTILAGE SURGERY    . MASTECTOMY     right  . WRIST SURGERY Bilateral    Family History  Problem Relation Age of Onset  . Diabetes Mellitus II Mother   . Lung cancer Mother   . Congestive Heart Failure Mother   . Coronary artery disease Father     CABG x 3  . Hypertension Father   . Diabetes Mellitus II Father   . Breast cancer Cousin   . Breast cancer Cousin   . Uterine cancer  Cousin   . Colon cancer Neg Hx    Social History  Substance Use Topics  . Smoking status: Former Smoker    Quit date: 04/12/2001  . Smokeless tobacco: Never Used  . Alcohol use No   Current Facility-Administered Medications  Medication Dose Route Frequency Provider Last Rate Last Dose  . lactated ringers infusion   Intravenous Continuous Manus Gunning, MD 20 mL/hr at 03/26/16 0855 1,000 mL at 03/26/16 0855   Allergies    Allergen Reactions  . Bee Venom Anaphylaxis  . Aspirin Hives  . Ciprofloxacin Hives  . Codeine Nausea Only    "can take ONLY if she eats with med"  . Naproxen Hives  . Other Other (See Comments)    ANY TYPES OF METAL-CAUSES BLISTERS SKIN AND ITCHING   . Sulfonamide Derivatives Hives  . Xarelto [Rivaroxaban] Hives  . Atorvastatin Itching  . Nsaids Itching and Rash  . Tape Rash    Also allergic to metal     Review of Systems: All systems reviewed and negative except where noted in HPI.    Dg Chest 2 View  Result Date: 03/16/2016 CLINICAL DATA:  Shortness of breath, atrial fibrillation, coronary artery disease post MI x 2, CHF, breast cancer post RIGHT mastectomy, hypertension, diabetes mellitus, COPD, asthma EXAM: CHEST  2 VIEW COMPARISON:  12/20/2015 FINDINGS: LEFT subclavian transvenous AICD lead projects at RIGHT ventricle. Enlargement of cardiac silhouette with pulmonary vascular congestion. Atherosclerotic calcification aorta. Diffuse interstitial infiltrates likely representing mild pulmonary edema. Underlying emphysematous changes. No segmental consolidation, pleural effusion or pneumothorax. Bones demineralized. IMPRESSION: Mild CHF superimposed upon a background of COPD changes. Aortic atherosclerosis. Electronically Signed   By: Lavonia Dana M.D.   On: 03/16/2016 11:55    Physical Exam: BP 138/72   Pulse 89   Temp 97.6 F (36.4 C) (Oral)   Resp 17   SpO2 95%  Constitutional: Pleasant,well-developed, female in no acute distress.  Cardiovascular: Normal rate  Pulmonary/chest: Effort normal and breath sounds normal. No wheezing, rales or rhonchi. Abdominal: Soft, nondistended, nontender.  ASSESSMENT AND PLAN: 59 y/o female with history of previous GI bleeding in the setting of anticoagulation, thought to be due to AVMs, with findings of duodenal polyps. Here for repeat EGD with polypectomy to ensure no adenomas Discussed risks / benefits of EGD and anesthesia and she  wishes to proceed.   Bellevue Cellar, MD [facility] Pager [phone]

## 2016-03-26 NOTE — Discharge Instructions (Signed)

## 2016-03-26 NOTE — Anesthesia Postprocedure Evaluation (Signed)
Anesthesia Post Note  Patient: [name]  Procedure(s) Performed: Procedure(s) (LRB): ENTEROSCOPY (N/A) ESOPHAGOGASTRODUODENOSCOPY (EGD) (N/A)  Patient location during evaluation: PACU Anesthesia Type: MAC Level of consciousness: awake and alert Pain management: pain level controlled Vital Signs Assessment: post-procedure vital signs reviewed and stable Respiratory status: spontaneous breathing, nonlabored ventilation, respiratory function stable and patient connected to nasal cannula oxygen Cardiovascular status: stable and blood pressure returned to baseline Anesthetic complications: no    Last Vitals:  Vitals:   03/26/16 0830 03/26/16 1002  BP: 138/72 121/62  Pulse: 89 77  Resp: 17 (!) 21  Temp: 36.4 C     Last Pain:  Vitals:   03/26/16 0830  TempSrc: Oral  PainSc: 6                  Zenaida Deed

## 2016-03-26 NOTE — Anesthesia Preprocedure Evaluation (Addendum)
Anesthesia Evaluation  Patient identified by MRN, date of birth, ID band Patient awake    Reviewed: Allergy & Precautions, NPO status , Patient's Chart, lab work & pertinent test results  Airway Mallampati: II  TM Distance: >3 FB Neck ROM: Full    Dental  (+) Edentulous Upper   Pulmonary asthma , COPD, former smoker,    breath sounds clear to auscultation       Cardiovascular hypertension, + CAD, + Past MI and +CHF  + pacemaker + Cardiac Defibrillator  Rhythm:Regular Rate:Normal     Neuro/Psych    GI/Hepatic GERD  ,  Endo/Other  diabetes  Renal/GU      Musculoskeletal  (+) Arthritis ,   Abdominal (+) + obese,   Peds  Hematology  (+) anemia ,   Anesthesia Other Findings   Reproductive/Obstetrics                             Anesthesia Physical  Anesthesia Plan  ASA: IV  Anesthesia Plan: MAC   Post-op Pain Management:    Induction: Intravenous  Airway Management Planned: Nasal Cannula  Additional Equipment:   Intra-op Plan:   Post-operative Plan:   Informed Consent: I have reviewed the patients History and Physical, chart, labs and discussed the procedure including the risks, benefits and alternatives for the proposed anesthesia with the patient or authorized representative who has indicated his/her understanding and acceptance.     Plan Discussed with: CRNA  Anesthesia Plan Comments:         Anesthesia Quick Evaluation

## 2016-03-26 NOTE — Interval H&P Note (Signed)
History and Physical Interval Note:  03/26/2016 9:16 AM  [name]  has presented today for surgery, with the diagnosis of small bowel polyp  The various methods of treatment have been discussed with the patient and family. After consideration of risks, benefits and other options for treatment, the patient has consented to  Procedure(s): ENTEROSCOPY (N/A) ESOPHAGOGASTRODUODENOSCOPY (EGD) (N/A) as a surgical intervention .  The patient's history has been reviewed, patient examined, no change in status, stable for surgery.  I have reviewed the patient's chart and labs.  Questions were answered to the patient's satisfaction.     Renelda Loma Armbruster

## 2016-03-26 NOTE — Op Note (Signed)
[facility] Patient Name: [name] Procedure Date: 03/26/2016 MRN: [id number] Attending MD: Carlota Raspberry. Armbruster , MD Date of Birth: [birth year] CSN: [id number] Age: 60 Admit Type: Outpatient Procedure:                Upper GI endoscopy Indications:              history of small bowel polyps not removed on prior                            endoscopies (previously done for GI bleeding                            thought to be due to small bowel AVMs) Providers:                Carlota Raspberry. Havery Moros, MD, Malka So, RN,                            William Dalton, Technician Referring MD:              Medicines:                Monitored Anesthesia Care Complications:            No immediate complications. Estimated blood loss:                            Minimal. Estimated Blood Loss:     Estimated blood loss was minimal. Procedure:                Pre-Anesthesia Assessment:                           - Prior to the procedure, a History and Physical                            was performed, and patient medications and                            allergies were reviewed. The patient's tolerance of                            previous anesthesia was also reviewed. The risks                            and benefits of the procedure and the sedation                            options and risks were discussed with the patient.                            All questions were answered, and informed consent                            was obtained. Prior Anticoagulants: The patient has  taken no previous anticoagulant or antiplatelet                            agents. ASA Grade Assessment: III - A patient with                            severe systemic disease. After reviewing the risks                            and benefits, the patient was deemed in                            satisfactory condition to undergo the procedure.  After obtaining informed consent, the endoscope was                            passed under direct vision. Throughout the                            procedure, the patient's blood pressure, pulse, and                            oxygen saturations were monitored continuously. The                            [id number]) scope was introduced through the                            mouth, and advanced to the second part of duodenum.                            The upper GI endoscopy was accomplished without                            difficulty. The patient tolerated the procedure                            well. Scope In: Scope Out: Findings:      Esophagogastric landmarks were identified: the Z-line was found at 39       cm, the gastroesophageal junction was found at 39 cm and the upper       extent of the gastric folds was found at 39 cm from the incisors.      The exam of the esophagus was otherwise normal.      Multiple 3 to 8 mm sessile polyps were found in the gastric fundus and       in the gastric body, consistent with benign fundic gland polyps.       Biopsies were taken with a cold forceps for histology from a       representative polyp to confirm diagnosis, ensure no adenomatous change.      The exam of the stomach was otherwise normal.      Two roughly 5 mm sessile polyps were found in the duodenal bulb. These       polyps were removed with a hot snare. Resection and retrieval  were       complete. At the more distal polypectomy site in the duodenal bulb, the       polypectomy site had more depth than anticipated and polyp felt "tough /       hard" during polypectomy. To prevent bleeding after the polypectomy, one       hemostatic clip was successfully placed at the site. There was no       bleeding during, or at the end, of the procedure.      The exam of the duodenum was otherwise normal. No AVMs otherwise       appreciated (previously ablated) Impression:               -  Esophagogastric landmarks identified.                           - Normal esophagus otherwise                           - Multiple gastric polyps, suspicious for benign                            fundic gland polyps. Representative polyp biopsied.                           - Two duodenal polyps. Resected and retrieved. Clip                            was placed as a prophylactic measure.                           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 Moderate Sedation:      N/A- Per Anesthesia Care Recommendation:           - Patient has a contact number available for                            emergencies. The signs and symptoms of potential                            delayed complications were discussed with the                            patient. Return to normal activities tomorrow.                            Written discharge instructions were provided to the                            patient.                           - Resume previous diet.                           - Continue present medications.                           -  No ibuprofen, naproxen, or other non-steroidal                            anti-inflammatory drugs for 2 weeks after polyp                            removal.                           - Await pathology results.                           - Repeat upper endoscopy for surveillance based on                            pathology results.                           - Follow up with cardiology to discuss                            anticoagulation recommendations. No obvious AVMs                            noted on this exam, would consider another trial of                            anticoagulation. Procedure Code(s):        --- Professional ---                           [phone], Esophagogastroduodenoscopy, flexible,                            transoral; with removal of tumor(s), polyp(s), or                            other lesion(s) by snare technique                            43239, 36, Esophagogastroduodenoscopy, flexible,                            transoral; with biopsy, single or multiple Diagnosis Code(s):        --- Professional ---                           K31.7, Polyp of stomach and duodenum CPT copyright 2016 [employer]. All rights reserved. The codes documented in this report are preliminary and upon coder review may  be revised to meet current compliance requirements. Remo Lipps P. Armbruster, MD 03/26/2016 10:03:39 AM This report has been signed electronically. Number of Addenda: 0

## 2016-03-26 NOTE — Transfer of Care (Signed)
Immediate Anesthesia Transfer of Care Note  Patient: [name]  Procedure(s) Performed: Procedure(s): ENTEROSCOPY (N/A) ESOPHAGOGASTRODUODENOSCOPY (EGD) (N/A)  Patient Location: PACU  Anesthesia Type:MAC  Level of Consciousness: awake, alert  and oriented  Airway & Oxygen Therapy: Patient Spontanous Breathing and Patient connected to nasal cannula oxygen  Post-op Assessment: Report given to RN and Post -op Vital signs reviewed and stable  Post vital signs: Reviewed and stable  Last Vitals:  Vitals:   03/26/16 0830  BP: 138/72  Pulse: 89  Resp: 17  Temp: 36.4 C    Last Pain:  Vitals:   03/26/16 0830  TempSrc: Oral  PainSc: 6          Complications: No apparent anesthesia complications

## 2016-03-27 ENCOUNTER — Encounter (HOSPITAL_COMMUNITY): Payer: Self-pay | Admitting: Gastroenterology

## 2016-03-27 ENCOUNTER — Telehealth: Payer: Self-pay | Admitting: Internal Medicine

## 2016-03-27 ENCOUNTER — Other Ambulatory Visit (HOSPITAL_COMMUNITY): Payer: Self-pay | Admitting: *Deleted

## 2016-03-27 MED ORDER — DIGOXIN 125 MCG PO TABS: 0.1250 mg | ORAL_TABLET | ORAL | 3 refills | Status: DC

## 2016-03-27 NOTE — Telephone Encounter (Signed)
She is following with GI-- Dr Havery Moros who has asked her to stay off the Eliquis for 2 more weeks then restart.  She just wants to know if she has another issue with GI bleeding what will see do.  I have let her know I will ask Dr Rayann Heman tomorrow and call her back

## 2016-03-27 NOTE — Telephone Encounter (Signed)
New message   Pt c/o medication issue:  1. Name of Medication: Eliquis   2. How are you currently taking this medication (dosage and times per day)? 5mg  2x day  3. Are you having a reaction (difficulty breathing--STAT)? no  4. What is your medication issue? Pt verbalized that she has bleeding issues, her hemoglobin is 11.2 was down to 8 and she said she quit taking the Eliquis and she had a procedure done 03/26/16 and it was to have palpus cut out of her lower intestine.   Pt verbalized that she is trying to go back on it in two weeks she is trying to get the hemoglobin higher

## 2016-03-28 ENCOUNTER — Other Ambulatory Visit: Payer: Self-pay | Admitting: *Deleted

## 2016-03-28 ENCOUNTER — Encounter: Payer: Self-pay | Admitting: Gastroenterology

## 2016-03-28 ENCOUNTER — Telehealth (HOSPITAL_COMMUNITY): Payer: Self-pay | Admitting: *Deleted

## 2016-03-28 MED ORDER — DIGOXIN 125 MCG PO TABS: 0.1250 mg | ORAL_TABLET | ORAL | 3 refills | Status: DC

## 2016-03-28 NOTE — Patient Outreach (Signed)
[place] [facility]) Care Management  03/28/2016  [name] 01/07/1956 [id number]   Transition of care call  Spoke with [name] , she reports that she is doing pretty good other than feeling a little nauseated, but this happens sometimes, denies vomiting and tolerating diet today. Patient states her stools are normal and no blood noted. She reports her recent hemoglobin is 11.2  Patient reports his blood sugar this am was 191 and then at lunch 136, she reports one recent hypoglycemic episode and followed treatment protocol with good results. Patient states she continues to monitor her carbohydrate intake she states she last week result from PCP office and her A1c is 6.4.  Patient reports her weight today is 202.8, denies shortness of breath or swelling , she using her same scales and weighing at same time.   [name] discussed that she is be off her Eliquis for a total of 2 weeks then restart and to notify GI doctor and Dr.Allred if she notices bleeding .   Patient has completed transition of care period , but is high risk for readmission and will benefit from continued management from [facility] for education and support of Heart Failure  and patient is agreeable with goal to transition to health coach.    Plan Will follow up with patient by telephone on next week, and then schedule home visit for September. Patient will notify MD of signs of bleedings.    Joylene Draft, RN, [place] [facility] [phone]- Mobile [phone]- Toll Free Main Office

## 2016-03-28 NOTE — Telephone Encounter (Signed)
Discussed with Dr Rayann Heman and will have her restart at [facility] request and she will let me know once she restarts.  If she has another bleed may be worth while looking into [facility] per Allred

## 2016-03-29 MED ORDER — DIGOXIN 125 MCG PO TABS: 0.1250 mg | ORAL_TABLET | ORAL | 3 refills | Status: DC

## 2016-03-29 NOTE — Telephone Encounter (Signed)
Unsure why refill was not going through, called refill into pharmacy and left pt a mess this had been done

## 2016-03-29 NOTE — Addendum Note (Signed)
Addended by: Scarlette Calico on: 03/29/2016 04:44 PM   Modules accepted: Orders

## 2016-03-29 NOTE — Telephone Encounter (Signed)
Please advise, Rx did not go through

## 2016-04-01 ENCOUNTER — Telehealth: Payer: Self-pay | Admitting: Internal Medicine

## 2016-04-01 NOTE — Telephone Encounter (Signed)
Spoke with patient and she feels fine.  She will continue to monitor and let me know if she has more low heart rates.

## 2016-04-01 NOTE — Telephone Encounter (Signed)
New message      Pt c/o BP issue: STAT if pt c/o blurred vision, one-sided weakness or slurred speech  1. What are your last 5 BP readings? 113/78 HR 46 at 7am, 126/83  HR 68 at approx 10am 2. Are you having any other symptoms (ex. Dizziness, headache, blurred vision, passed out)? no 3. What is your BP issue? Every morning, part of pt's normal routine, she checks her bp, heart rate and wt.  Her heart rate was really low with no other symptoms.  Please advise

## 2016-04-04 ENCOUNTER — Other Ambulatory Visit: Payer: Self-pay | Admitting: *Deleted

## 2016-04-04 NOTE — Patient Outreach (Signed)
[facility] [facility]) Care Management  04/04/2016  [name] 08-May-1956 [id number]   Telephone assessment  Placed call to [name] , HIPAA compliance verified. Patient reports she was just resting, discussed she didn't sleep well last night. Patient denies chest pain, shortness of breath, increased swelling. [name] states her weight is 202.3, patient discussed episode of getting a heart rate reading of 48 in the morning recently, but it came back up the second time she checked. Patient denies having any new symptoms, discussed importance of notifying MD of new symptoms or worsening, and importance of notifying MD of vitals outside her normal range.   [name] discussed that she is scheduled to restart her eliquis on 9/5, reviewed importance of notify MD of noted bleeding, patient reports normal bowel movement on today.   Plan Patient agreeable to continued community care management and home visit , which is scheduled in month of September, with to transition to health coach when goals met  [facility] CM Care Plan Problem One   Flowsheet Row Most Recent Value  Care Plan Problem One  Knowledge deficit related to Heart failure evidenced by recent related hosptial admission   Role Documenting the Problem One  Care Management [place] for Problem One  Active  [facility] Long Term Goal (31-90 days)  Patient will not experience a hospital admission in the next 31 days related to Heart failure  [place] Term Goal Start Date  04/04/16  Interventions for Problem One Long Term Goal  Discussed with patient importance of making MD aware of new or worsening symptoms, taking medications as prescribed and keeping all MD appointment   [facility] CM Short Term Goal #1 (0-30 days)  Patient will continue to weigh daily and keep a record in the next 30 days   [facility] CM Short Term Goal #1 Start Date  04/04/16  Interventions for Short Term Goal #1  Reinforced with patient to continue to  weigh daily and record   [facility] CM Short Term Goal #2 (0-30 days)  Patient will check blood pressure daily and notify MD of consistent changes in the next 30 days   [facility] CM Short Term Goal #2 Start Date  04/04/16  Interventions for Short Term Goal #2  Discussed with patient to check blood pressure record, and notify MD of changes outside her normal readings, and any new symptoms of weakness, pain shortness of breath.      Joylene Draft, RN, [place] [facility] [phone]- Mobile [phone]- [place] Office

## 2016-04-11 ENCOUNTER — Ambulatory Visit: Payer: Self-pay | Admitting: *Deleted

## 2016-04-19 ENCOUNTER — Ambulatory Visit: Payer: Self-pay | Admitting: *Deleted

## 2016-04-22 DIAGNOSIS — I10 Essential (primary) hypertension: Secondary | ICD-10-CM | POA: Diagnosis not present

## 2016-04-22 DIAGNOSIS — E785 Hyperlipidemia, unspecified: Secondary | ICD-10-CM | POA: Diagnosis not present

## 2016-04-22 DIAGNOSIS — K31811 Angiodysplasia of stomach and duodenum with bleeding: Secondary | ICD-10-CM | POA: Diagnosis not present

## 2016-04-22 DIAGNOSIS — D62 Acute posthemorrhagic anemia: Secondary | ICD-10-CM | POA: Diagnosis not present

## 2016-04-29 ENCOUNTER — Other Ambulatory Visit (HOSPITAL_COMMUNITY): Payer: Self-pay | Admitting: *Deleted

## 2016-04-29 MED ORDER — CARVEDILOL 6.25 MG PO TABS: 6.2500 mg | ORAL_TABLET | Freq: Two times a day (BID) | ORAL | 6 refills | Status: DC

## 2016-04-30 ENCOUNTER — Other Ambulatory Visit (HOSPITAL_COMMUNITY): Payer: Self-pay | Admitting: *Deleted

## 2016-04-30 MED ORDER — CARVEDILOL 6.25 MG PO TABS: 6.2500 mg | ORAL_TABLET | Freq: Two times a day (BID) | ORAL | 6 refills | Status: DC

## 2016-05-02 ENCOUNTER — Telehealth (HOSPITAL_COMMUNITY): Payer: Self-pay | Admitting: *Deleted

## 2016-05-02 NOTE — Telephone Encounter (Signed)
Pt called concerned about swelling in her left foot/ankle.  She states the right foot/ankle is only slightly swollen, but left is large.  She report wt was up on Sun am and she took Metolazone which brought it back down to 209 lb, which is were it has been since then.  She states she has been a little more SOB but r/t being o ut of her Spiriva which she is getting refilled.  She denies redness/pain/injury to left foot/ankle.  She is states she has been up on her feet more more than normal and thinks that could be a port of the issue.  Advised ok to take extra Lasix today and keep feet elevated, if not improved tomorrow she will call us back.

## 2016-05-06 ENCOUNTER — Telehealth: Payer: Self-pay | Admitting: Cardiology

## 2016-05-06 ENCOUNTER — Encounter: Payer: Medicare Other | Admitting: *Deleted

## 2016-05-06 ENCOUNTER — Ambulatory Visit: Payer: Self-pay | Admitting: *Deleted

## 2016-05-06 NOTE — Telephone Encounter (Signed)
LMOVM reminding pt to send remote transmission.   

## 2016-05-07 ENCOUNTER — Encounter: Payer: Self-pay | Admitting: *Deleted

## 2016-05-07 ENCOUNTER — Other Ambulatory Visit: Payer: Self-pay | Admitting: *Deleted

## 2016-05-07 NOTE — Patient Outreach (Signed)
[place] [facility]) Care Management  05/07/2016  [name] Jan 07, 1956 [id number]  Telephone Assessment  Placed call to Mrs.[name], HIPAA information verified. Telephone follow up call to discuss rescheduling home visit that patient had to cancel for September and yesterday visit.  Mrs.[name] states that she is feeling stronger and better than she has in a year. Patient discussed problem with swelling in legs on last week, she notified heart failure clinic as action plan, took extra lasix and swelling was down the next day and has not reoccurred. Patient reports that her weight is down to 207.4 and she denies shortness of breath or increased swelling.   Patient discussed she is doing better with choosing the foods that she eats, her blood sugar was 139, this am and recent A1c 6.8.   Patient reports taking all medications as prescribed, no rectal bleeding noted.  Mrs.[name] states she plans to notify cardiology office because she has misplaced the monitor used for transmission of device, acknowledges she received a message from the office regarding transmission is due.  Mrs.[name] declines need for further home visit to be rescheduled, care management goals have been met and no new concerns  identified, patient is in agreement for transition to telephonic health coach for continued education and management of Heart Failure.  Patient has demonstrated proper follow up on yellow zone symptoms of weight gain, shortness of breath swelling, she states when I see a problem I know to call about it sooner.    Plan Will close case to community care coordinator as goals met and no new concerns identified. Will place consult to health coach for continued education and management of Heart Failure.  Joylene Draft, RN, [place] [facility] [phone]- Mobile [phone]- Toll Free Main Office

## 2016-05-10 ENCOUNTER — Encounter: Payer: Self-pay | Admitting: Cardiology

## 2016-05-10 ENCOUNTER — Encounter: Payer: Self-pay | Admitting: *Deleted

## 2016-05-22 DIAGNOSIS — E785 Hyperlipidemia, unspecified: Secondary | ICD-10-CM | POA: Diagnosis not present

## 2016-05-22 DIAGNOSIS — K31811 Angiodysplasia of stomach and duodenum with bleeding: Secondary | ICD-10-CM | POA: Diagnosis not present

## 2016-05-22 DIAGNOSIS — D62 Acute posthemorrhagic anemia: Secondary | ICD-10-CM | POA: Diagnosis not present

## 2016-05-22 DIAGNOSIS — Z23 Encounter for immunization: Secondary | ICD-10-CM | POA: Diagnosis not present

## 2016-05-24 ENCOUNTER — Other Ambulatory Visit: Payer: Self-pay | Admitting: *Deleted

## 2016-05-24 ENCOUNTER — Other Ambulatory Visit (HOSPITAL_COMMUNITY): Payer: Self-pay | Admitting: Student

## 2016-05-24 MED ORDER — SPIRONOLACTONE 25 MG PO TABS: 25.0000 mg | ORAL_TABLET | Freq: Every day | ORAL | 3 refills | Status: DC

## 2016-05-27 ENCOUNTER — Telehealth: Payer: Self-pay | Admitting: Gastroenterology

## 2016-05-27 ENCOUNTER — Telehealth: Payer: Self-pay

## 2016-05-27 ENCOUNTER — Emergency Department (HOSPITAL_COMMUNITY)
Admission: EM | Admit: 2016-05-27 | Discharge: 2016-05-27 | Disposition: A | Discharge status: Home / Self Care | Payer: Medicare Other | Attending: Emergency Medicine | Admitting: Emergency Medicine

## 2016-05-27 ENCOUNTER — Encounter (HOSPITAL_COMMUNITY): Payer: Self-pay | Admitting: Emergency Medicine

## 2016-05-27 DIAGNOSIS — J45909 Unspecified asthma, uncomplicated: Secondary | ICD-10-CM | POA: Insufficient documentation

## 2016-05-27 DIAGNOSIS — I5022 Chronic systolic (congestive) heart failure: Secondary | ICD-10-CM | POA: Diagnosis not present

## 2016-05-27 DIAGNOSIS — I110 Hypertensive heart disease with heart failure: Secondary | ICD-10-CM | POA: Insufficient documentation

## 2016-05-27 DIAGNOSIS — I2510 Atherosclerotic heart disease of native coronary artery without angina pectoris: Secondary | ICD-10-CM | POA: Insufficient documentation

## 2016-05-27 DIAGNOSIS — Z85038 Personal history of other malignant neoplasm of large intestine: Secondary | ICD-10-CM | POA: Diagnosis not present

## 2016-05-27 DIAGNOSIS — R109 Unspecified abdominal pain: Secondary | ICD-10-CM | POA: Diagnosis present

## 2016-05-27 DIAGNOSIS — J449 Chronic obstructive pulmonary disease, unspecified: Secondary | ICD-10-CM | POA: Insufficient documentation

## 2016-05-27 DIAGNOSIS — I252 Old myocardial infarction: Secondary | ICD-10-CM | POA: Insufficient documentation

## 2016-05-27 DIAGNOSIS — K625 Hemorrhage of anus and rectum: Secondary | ICD-10-CM | POA: Diagnosis not present

## 2016-05-27 DIAGNOSIS — Z87891 Personal history of nicotine dependence: Secondary | ICD-10-CM | POA: Diagnosis not present

## 2016-05-27 DIAGNOSIS — Z79899 Other long term (current) drug therapy: Secondary | ICD-10-CM | POA: Diagnosis not present

## 2016-05-27 DIAGNOSIS — E119 Type 2 diabetes mellitus without complications: Secondary | ICD-10-CM | POA: Insufficient documentation

## 2016-05-27 DIAGNOSIS — Z853 Personal history of malignant neoplasm of breast: Secondary | ICD-10-CM | POA: Diagnosis not present

## 2016-05-27 LAB — CBC
HCT: 39.7 % (ref 36.0–46.0)
Hemoglobin: 12.6 g/dL (ref 12.0–15.0)
MCH: 26.9 pg (ref 26.0–34.0)
MCHC: 31.7 g/dL (ref 30.0–36.0)
MCV: 84.6 fL (ref 78.0–100.0)
PLATELETS: 297 10*3/uL (ref 150–400)
RBC: 4.69 MIL/uL (ref 3.87–5.11)
RDW: 19.5 % — AB (ref 11.5–15.5)
WBC: 12.7 10*3/uL — AB (ref 4.0–10.5)

## 2016-05-27 LAB — COMPREHENSIVE METABOLIC PANEL
ALT: 20 U/L (ref 14–54)
AST: 26 U/L (ref 15–41)
Albumin: 3.6 g/dL (ref 3.5–5.0)
Alkaline Phosphatase: 53 U/L (ref 38–126)
Anion gap: 12 (ref 5–15)
BILIRUBIN TOTAL: 0.6 mg/dL (ref 0.3–1.2)
BUN: 32 mg/dL — AB (ref 6–20)
CO2: 28 mmol/L (ref 22–32)
CREATININE: 1.05 mg/dL — AB (ref 0.44–1.00)
Calcium: 9.9 mg/dL (ref 8.9–10.3)
Chloride: 94 mmol/L — ABNORMAL LOW (ref 101–111)
GFR calc Af Amer: >60 mL/min (ref >60)
GFR, EST NON AFRICAN AMERICAN: 56 mL/min — AB (ref >60)
Glucose, Bld: 248 mg/dL — ABNORMAL HIGH (ref 65–99)
Potassium: 3.5 mmol/L (ref 3.5–5.1)
Sodium: 134 mmol/L — ABNORMAL LOW (ref 135–145)
TOTAL PROTEIN: 7.6 g/dL (ref 6.5–8.1)

## 2016-05-27 LAB — TYPE AND SCREEN
ABO/RH(D): A POS
Antibody Screen: NEGATIVE

## 2016-05-27 LAB — POC OCCULT BLOOD, ED: Fecal Occult Bld: POSITIVE — AB

## 2016-05-27 NOTE — Discharge Instructions (Signed)
As discussed, your evaluation today has been largely reassuring.  But, it is important that you monitor your condition carefully, and do not hesitate to return to the ED if you develop new, or concerning changes in your condition. ? ?Otherwise, please follow-up with your physician for appropriate ongoing care. ? ?

## 2016-05-27 NOTE — ED Notes (Signed)
MD at bedside. 

## 2016-05-27 NOTE — ED Provider Notes (Signed)
[facility] DEPT Provider Note   CSN: [id number] Arrival date & time: 05/27/16  1316     History   Chief Complaint Chief Complaint  Patient presents with  . Melena  . Abdominal Pain    HPI [name] is a 60 y.o. female.  HPI Patient presents with concern of black stool Patient has multiple medical issues including A. fib, congestive heart failure, known prior colonic bleed. She notes over the past 2 days she has had episodes of black stool, with defecation, but otherwise has had no incontinence, no bright red blood per rectum, no hematemesis, no sustained abdominal pain, no fever, no syncope, no chest pain. Patient is here with her sister who assists with the HPI Patient has a gastroenterologist, with whom she discussed today's events, and was referred here for evaluation.   Past Medical History:  Diagnosis Date  . AICD (automatic cardioverter/defibrillator) present   . Anxiety   . 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 Asthma   . CHF (congestive heart failure) ([place])   . COPD (chronic obstructive pulmonary disease) ([place])   . Depression    followed by a psychiatrist  . DYSLIPIDEMIA 05/01/2009  . Gastroparesis   . GERD (gastroesophageal reflux disease)   . History of breast cancer 2006   with return in 2008, s/p mastectomy, XRT and chemotherapy  . Hypertension   . 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 IBS (irritable bowel syndrome)   . ICD (implantable cardiac defibrillator) in place 11/10/2012   ICD (11/2012)  . Myocardial infarction   . OBESITY 07/24/2007  . OBSTRUCTIVE SLEEP APNEA 07/24/2007   with CPAP compliance  . Osteoarthritis   . Persistent atrial fibrillation ([place]) 09/2014  . Presence of permanent cardiac pacemaker   . RESTLESS LEG SYNDROME 07/24/2007  . SYSTOLIC HEART FAILURE, CHRONIC 01/05/2009   a. chemo induced cardiomyopathy b. cMRI (02/2009) EF 45% c. Myoview 05/2010: EF 59%, no ischemia d. RHC (01/2012) RA 20, RV 55/13/22, PA 56/34 (44), PCWP 28, Fick CO/CI: 3.1 /  1.5, PVR 5.3 WU, PA sat 42% & 48% e. ECHO (10/2012) EF 20-25%, diff HK, MV calcified annulus f. ECHO (03/2014) EF 30-35%, sev HK, inferior/inferoseptal walls, mild MR    Patient Active Problem List   Diagnosis Date Noted  . Small bowel polyp   . CHF (congestive heart failure) ([place]) 12/21/2015  . Acute blood loss anemia   . Atrial fibrillation ([place]) 12/11/2015  . Benign neoplasm of descending colon   . Upper GI bleeding   . Iron deficiency anemia due to chronic blood loss   . Acute upper GI bleeding 11/14/2015  . Anemia 11/14/2015  . Absolute anemia   . Symptomatic anemia   . Bleeding gastrointestinal   . Chronic systolic CHF (congestive heart failure), NYHA class 2 ([place])   . PAF (paroxysmal atrial fibrillation) ([place]) 09/22/2015  . Chest pain at rest 08/19/2015  . Chest pain 08/19/2015  . NSVT (nonsustained ventricular tachycardia) ([place]) 06/30/2015  . Breast cancer of lower-outer quadrant of right female breast ([place]) 07/22/2014  . CAD (coronary artery disease) 02/17/2014  . 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 PALPITATIONS 09/20/2010  . Mixed hypercholesterolemia and hypertriglyceridemia 05/01/2009  . HYPERTENSION, BENIGN 01/05/2009  . FOOT PAIN 11/29/2008  . Sinus tachycardia 10/25/2008  . 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 Obesity 07/24/2007  . Obstructive sleep apnea 07/24/2007  . RESTLESS LEG SYNDROME 07/24/2007    Past Surgical History:  Procedure Laterality Date  . ABDOMINAL HYSTERECTOMY  [place] N/A 11/10/2012   SJM Forify Assura single chamber ICD  . CARDIAC CATHETERIZATION N/A 08/21/2015   Procedure: Right/Left Heart Cath and Coronary Angiography;  Surgeon: Jolaine Artist, MD;  Location: [facility];  Service: Cardiovascular;  Laterality: N/A;  . CARDIAC DEFIBRILLATOR PLACEMENT  11/10/2012   SJM Fortify Assura VR implanted by Dr Rayann Heman for  primary prevention  . CHOLECYSTECTOMY    . COLONOSCOPY N/A 11/17/2015   Procedure: COLONOSCOPY;  Surgeon: Jerene Bears, MD;  Location: [facility];  Service: Endoscopy;  Laterality: N/A;  . ENTEROSCOPY N/A 03/26/2016   Procedure: ENTEROSCOPY;  Surgeon: Manus Gunning, MD;  Location: [facility]OSCOPY;  Service: Gastroenterology;  Laterality: N/A;  . ESOPHAGOGASTRODUODENOSCOPY N/A 11/16/2015   Procedure: ESOPHAGOGASTRODUODENOSCOPY (EGD);  Surgeon: Manus Gunning, MD;  Location: [place];  Service: Gastroenterology;  Laterality: N/A;  . ESOPHAGOGASTRODUODENOSCOPY N/A 02/24/2016   Procedure: ESOPHAGOGASTRODUODENOSCOPY (EGD);  Surgeon: Milus Banister, MD;  Location: [facility];  Service: Endoscopy;  Laterality: N/A;  . ESOPHAGOGASTRODUODENOSCOPY N/A 03/26/2016   Procedure: ESOPHAGOGASTRODUODENOSCOPY (EGD);  Surgeon: Manus Gunning, MD;  Location: [facility];  Service: Gastroenterology;  Laterality: N/A;  . GIVENS CAPSULE STUDY N/A 12/11/2015   Procedure: GIVENS CAPSULE STUDY;  Surgeon: Manus Gunning, MD;  Location: [place] [facility];  Service: Gastroenterology;  Laterality: N/A;  . KNEE CARTILAGE SURGERY    . MASTECTOMY     right  . WRIST SURGERY Bilateral     OB History    No data available       Home Medications    Prior to Admission medications   Medication Sig Start Date End Date Taking? Authorizing Provider  albuterol (PROVENTIL HFA) 108 (90 BASE) MCG/ACT inhaler Inhale 2 puffs into the lungs every 6 (six) hours as needed. For shortness of breath    Historical Provider, MD  ALPRAZolam Duanne Moron) 0.5 MG tablet Take 0.5 mg by mouth daily as needed for anxiety.  11/06/15   Historical Provider, MD  amiodarone (PACERONE) 200 MG tablet Take 1 tablet (200 mg total) by mouth daily. 09/05/15   Amy D Clegg, NP  busPIRone (BUSPAR) 30 MG tablet Take 30 mg by mouth 2 (two) times daily.     Historical Provider, MD  carvedilol (COREG) 6.25 MG tablet Take 1 tablet (6.25 mg  total) by mouth 2 (two) times daily with a meal. 04/30/16   Jolaine Artist, MD  digoxin (LANOXIN) 0.125 MG tablet Take 1 tablet (0.125 mg total) by mouth every other day. 03/29/16   Jolaine Artist, MD  docusate sodium (COLACE) 100 MG capsule Take 3 capsules (300 mg total) by mouth at bedtime. 10/03/15   Amy D Ninfa Meeker, NP  DULoxetine (CYMBALTA) 30 MG capsule Take 30 mg by mouth daily.    Amy A Moon, NP  Fe Cbn-Fe Gluc-FA-B12-C-DSS (FERRALET 90) 90-1 MG TABS Take 1 tablet by mouth daily. 02/25/16   Cherene Altes, MD  furosemide (LASIX) 20 MG tablet Take 3 tablets (60 mg total) by mouth 2 (two) times daily. If 1 to 2 pound weight gain in a day, take extra 20 mg dose 02/25/16   Cherene Altes, MD  Garlic [id number] MG CAPS Take 500 mg by mouth daily.     Historical Provider, MD  Glucosamine 500 MG CAPS Take 500 mg by mouth daily.     Historical Provider, MD  HUMALOG KWIKPEN 100 UNIT/ML KiwkPen Inject 24 Units into the skin 3 (three) times daily. Sliding  scale TID 10/30/15   Historical Provider, MD  HUMALOG MIX 75/25 KWIKPEN (75-25) 100 UNIT/ML Kwikpen Inject 75 Units into the skin 2 (two) times daily. 11/21/15   Historical Provider, MD  hydrOXYzine (ATARAX/VISTARIL) 50 MG tablet Take 50 mg by mouth every 6 (six) hours as needed for itching.    Historical Provider, MD  ivabradine (CORLANOR) 5 MG TABS tablet Take 1 tablet (5 mg total) by mouth 2 (two) times daily with a meal. 02/09/16   Amy D Clegg, NP  levothyroxine (SYNTHROID, LEVOTHROID) 75 MCG tablet Take 75 mcg by mouth daily before breakfast.     Historical Provider, MD  Melatonin 1 MG TABS Take 0.5 mg by mouth at bedtime.    Historical Provider, MD  metolazone (ZAROXOLYN) 2.5 MG tablet Take 2.5 mg by mouth as needed (for weight gain >3 lbs).    Historical Provider, MD  mometasone (NASONEX) 50 MCG/ACT nasal spray Place 1 spray into both nostrils daily. allergies    Historical Provider, MD  Multiple Minerals (CALCIUM-MAGNESIUM-ZINC) TABS Take 1 tablet by  mouth daily.     Historical Provider, MD  nitroGLYCERIN (NITROSTAT) 0.4 MG SL tablet Place 1 tablet (0.4 mg total) under the tongue every 5 (five) minutes as needed. For chest pain 10/18/15   Jolaine Artist, MD  pantoprazole (PROTONIX) 40 MG tablet Take 40 mg by mouth 2 (two) times daily.      Historical Provider, MD  polyethylene glycol powder (GLYCOLAX/MIRALAX) powder Mix 17 grams in 8 oz of water twice daily 01/05/16   Manus Gunning, MD  potassium chloride SA (K-DUR,KLOR-CON) 20 MEQ tablet Take 3 tablets (60 mEq total) by mouth 3 (three) times daily. With an additional 40 meq on Metolazone 02/01/16   Shirley Friar, PA-C  pyridOXINE (VITAMIN B-6) 100 MG tablet Take 100 mg by mouth daily.    Historical Provider, MD  ramipril (ALTACE) 2.5 MG capsule Take 1 capsule (2.5 mg total) by mouth daily. 02/01/16   Shirley Friar, PA-C  rOPINIRole (REQUIP) 1 MG tablet Take 1 mg by mouth at bedtime.      Historical Provider, MD  Simethicone Extra Strength 125 MG CAPS Take 1 tablet by mouth daily as needed (for bloating). Reported on 02/23/2016    Historical Provider, MD  spironolactone (ALDACTONE) 25 MG tablet Take 1 tablet (25 mg total) by mouth daily. 05/24/16   Jolaine Artist, MD  SUMAtriptan (IMITREX) 100 MG tablet Take 1 tablet by mouth daily as needed for migraine.  11/28/15   Historical Provider, MD  tiotropium (SPIRIVA) 18 MCG inhalation capsule Place 18 mcg into inhaler and inhale daily.      Historical Provider, MD  vitamin B-12 (CYANOCOBALAMIN) 1000 MCG tablet Take 1,000 mcg by mouth daily.    Historical Provider, MD  vitamin C (ASCORBIC ACID) 500 MG tablet Take 500 mg by mouth daily.    Historical Provider, MD  vitamin E 100 UNIT capsule Take 400 Units by mouth daily.    Historical Provider, MD    Family History Family History  Problem Relation Age of Onset  . Diabetes Mellitus II Mother   . Lung cancer Mother   . Congestive Heart Failure Mother   . Coronary artery  disease Father     CABG x 3  . Hypertension Father   . Diabetes Mellitus II Father   . Breast cancer Cousin   . Breast cancer Cousin   . Uterine cancer Cousin   . Colon cancer Neg Hx  Social History Social History  Substance Use Topics  . Smoking status: Former Smoker    Quit date: 04/12/2001  . Smokeless tobacco: Never Used  . Alcohol use No     Allergies   Bee venom; Aspirin; Ciprofloxacin; Codeine; Naproxen; Other; Sulfonamide derivatives; Xarelto [rivaroxaban]; Atorvastatin; Nsaids; and Tape   Review of Systems Review of Systems  Constitutional:       Per HPI, otherwise negative  HENT:       Per HPI, otherwise negative  Respiratory:       Per HPI, otherwise negative  Cardiovascular:       Per HPI, otherwise negative  Gastrointestinal: Positive for blood in stool and constipation. Negative for nausea, rectal pain and vomiting.  Endocrine:       Negative aside from HPI  Genitourinary:       Neg aside from HPI   Musculoskeletal:       Per HPI, otherwise negative  Skin: Negative.   Neurological: Negative for syncope.     Physical Exam Updated Vital Signs BP 112/60 (BP Location: Left Arm)   Pulse 94   Temp 98.1 F (36.7 C) (Oral)   Resp 18   Ht 5\' 5"  (1.651 m)   Wt 202 lb (91.6 kg)   SpO2 95%   BMI 33.61 kg/m   Physical Exam  Constitutional: She is oriented to person, place, and time. She appears well-developed and well-nourished. No distress.  HENT:  Head: Normocephalic and atraumatic.  Eyes: Conjunctivae and EOM are normal.  Cardiovascular: Normal rate and regular rhythm.   Pulmonary/Chest: Effort normal and breath sounds normal. No stridor. No respiratory distress.  Abdominal: She exhibits no distension and no mass. There is no tenderness. There is no guarding.  Genitourinary:  Genitourinary Comments: Hemoccult-positive  Musculoskeletal: She exhibits no edema.  Neurological: She is alert and oriented to person, place, and time. No cranial nerve  deficit.  Skin: Skin is warm and dry.  Psychiatric: She has a normal mood and affect.  Nursing note and vitals reviewed.    ED Treatments / Results  Labs (all labs ordered are listed, but only abnormal results are displayed) Labs Reviewed  COMPREHENSIVE METABOLIC PANEL - Abnormal; Notable for the following:       Result Value   Sodium 134 (*)    Chloride 94 (*)    Glucose, Bld 248 (*)    BUN 32 (*)    Creatinine, Ser 1.05 (*)    GFR calc non Af Amer 56 (*)    All other components within normal limits  CBC - Abnormal; Notable for the following:    WBC 12.7 (*)    RDW 19.5 (*)    All other components within normal limits  POC OCCULT BLOOD, ED - Abnormal; Notable for the following:    Fecal Occult Bld POSITIVE (*)    All other components within normal limits  TYPE AND SCREEN    Procedures Procedures (including critical care time)    Initial Impression / Assessment and Plan / ED Course  I have reviewed the triage vital signs and the nursing notes.  Pertinent labs & imaging results that were available during my care of the patient were reviewed by me and considered in my medical decision making (see chart for details).  Clinical Course    Elderly female with multiple medical issues including chronic anticoagulant use, congestive heart failure, prior colonic bleeds presents with lack stool. Here she is awake, alert, hemodynamically stable, in no distress. Patient  does have Hemoccult-positive stool, but not with no evidence for substantial exsanguination, brisk bleeding, hemodynamic instability, and with close outpatient follow up capacity with GI tomorrow morning, the patient is appropriate for discharge. Patient and sister amenable to this plan.   Final Clinical Impressions(s) / ED Diagnoses   Final diagnoses:  Rectal bleeding     Carmin Muskrat, MD 05/27/16 [phone]

## 2016-05-27 NOTE — Telephone Encounter (Signed)
See other telephone note, patient instructed to go to ER

## 2016-05-27 NOTE — ED Triage Notes (Signed)
Pt states she has been having black stools since Friday evening with intermittent RLQ pain. Pt reports feeling a little fatigue over the past week.

## 2016-05-27 NOTE — Telephone Encounter (Signed)
Patient called in, reported labs from primary care showing dowtrending Hgb, and then she developed melena since Friday which has persisted. Not on iron. 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 I think she should go to the ER if she is having symptoms concerning for GI bleeding so they can evaluate her and admit if she is having GI bleed.

## 2016-05-27 NOTE — Telephone Encounter (Signed)
Spoke to patient this morning, she states that on Thursday was seen by her PCP and had CBC. She states it was previously at 13.4 and dropped to 12.2. Friday she noticed that her stools turned black and have continued to be black. Complaining of fatigue, some shortness of breath, does take inhalers. Please advise.

## 2016-05-27 NOTE — Telephone Encounter (Signed)
Spoke to Dr. Havery Moros, he is recommending that patient go to the ED for evaluation. Called patient and her sister is there and will take her to the Ed. Called patient back, asked if she is still taking a blood thinner, ask that she lvm.

## 2016-05-27 NOTE — ED Notes (Signed)
Pt states she started to have black stools that started on Friday. States her PCP told her she needed to come to the ED. Also states that she has been tired as well.

## 2016-05-28 ENCOUNTER — Telehealth: Payer: Self-pay | Admitting: Gastroenterology

## 2016-05-28 NOTE — Telephone Encounter (Signed)
Looking at patient's ED visit, she was positive for occult blood. She is still taking her Eliquist. Patient reports that today, her stools are normal color, still fatigued.

## 2016-05-28 NOTE — Telephone Encounter (Signed)
Thanks for the follow up. I looked at her records, Hgb was normal yesterday, although BUN was elevated, unclear if she passed a small amount of blood. Is she taking iron every day? This could make stools dark, and possibly positive FOBT. If her stools are normal now that is good. Please have her continue to monitor her bowel movements. Given her Hgb is normal, it is not causing her fatigue, perhaps there is something else going on. Any fevers or symptoms of infection?  If over the next few days she has dark stools again or concerned about bleeding we can repeat a CBC to ensure stable, please ask her to call us if she notes this.  Thanks

## 2016-05-29 NOTE — Telephone Encounter (Signed)
Unable to reach patient.

## 2016-05-31 ENCOUNTER — Other Ambulatory Visit (HOSPITAL_COMMUNITY): Payer: Self-pay | Admitting: *Deleted

## 2016-05-31 DIAGNOSIS — I5023 Acute on chronic systolic (congestive) heart failure: Secondary | ICD-10-CM

## 2016-05-31 MED ORDER — DIGOXIN 125 MCG PO TABS: 0.1250 mg | ORAL_TABLET | ORAL | 3 refills | Status: DC

## 2016-05-31 MED ORDER — CARVEDILOL 6.25 MG PO TABS: 6.2500 mg | ORAL_TABLET | Freq: Two times a day (BID) | ORAL | 3 refills | Status: DC

## 2016-05-31 MED ORDER — IVABRADINE HCL 5 MG PO TABS: 5.0000 mg | ORAL_TABLET | Freq: Two times a day (BID) | ORAL | 3 refills | Status: DC

## 2016-05-31 MED ORDER — RAMIPRIL 2.5 MG PO CAPS: 2.5000 mg | ORAL_CAPSULE | Freq: Every day | ORAL | 3 refills | Status: DC

## 2016-06-03 ENCOUNTER — Telehealth: Payer: Self-pay | Admitting: Gastroenterology

## 2016-06-03 ENCOUNTER — Other Ambulatory Visit (INDEPENDENT_AMBULATORY_CARE_PROVIDER_SITE_OTHER): Payer: Medicare Other

## 2016-06-03 ENCOUNTER — Other Ambulatory Visit: Payer: Self-pay

## 2016-06-03 DIAGNOSIS — R5383 Other fatigue: Secondary | ICD-10-CM

## 2016-06-03 LAB — CBC WITH DIFFERENTIAL/PLATELET
BASOS ABS: 0 10*3/uL (ref 0.0–0.1)
BASOS PCT: 0.2 % (ref 0.0–3.0)
EOS PCT: 1.5 % (ref 0.0–5.0)
Eosinophils Absolute: 0.1 10*3/uL (ref 0.0–0.7)
HEMATOCRIT: 36.8 % (ref 36.0–46.0)
Hemoglobin: 12.3 g/dL (ref 12.0–15.0)
LYMPHS PCT: 14.9 % (ref 12.0–46.0)
Lymphs Abs: 1.3 10*3/uL (ref 0.7–4.0)
MCHC: 33.5 g/dL (ref 30.0–36.0)
MCV: 81.9 fl (ref 78.0–100.0)
MONOS PCT: 5.5 % (ref 3.0–12.0)
Monocytes Absolute: 0.5 10*3/uL (ref 0.1–1.0)
NEUTROS ABS: 6.7 10*3/uL (ref 1.4–7.7)
Neutrophils Relative %: 77.9 % — ABNORMAL HIGH (ref 43.0–77.0)
PLATELETS: 299 10*3/uL (ref 150.0–400.0)
RBC: 4.49 Mil/uL (ref 3.87–5.11)
WBC: 8.6 10*3/uL (ref 4.0–10.5)

## 2016-06-03 NOTE — Telephone Encounter (Signed)
Spoke to patient, she will come get a CBC done today. Dr. Havery Moros will look at the results and based on that decide on OV. Also patient is requesting he prescribe something else besides the iron, states that insurance is not paying for this. I have suggested that her pharmacy send a prior authorization to our office.

## 2016-06-04 ENCOUNTER — Other Ambulatory Visit: Payer: Self-pay

## 2016-06-04 ENCOUNTER — Other Ambulatory Visit (HOSPITAL_COMMUNITY): Payer: Self-pay | Admitting: *Deleted

## 2016-06-04 ENCOUNTER — Other Ambulatory Visit: Payer: Self-pay | Admitting: *Deleted

## 2016-06-04 DIAGNOSIS — I5022 Chronic systolic (congestive) heart failure: Secondary | ICD-10-CM

## 2016-06-04 DIAGNOSIS — I5023 Acute on chronic systolic (congestive) heart failure: Secondary | ICD-10-CM

## 2016-06-04 MED ORDER — POLYETHYLENE GLYCOL 3350 17 GM/SCOOP PO POWD: ORAL | 3 refills | Status: AC

## 2016-06-05 ENCOUNTER — Other Ambulatory Visit (HOSPITAL_COMMUNITY): Payer: Self-pay | Admitting: *Deleted

## 2016-06-05 DIAGNOSIS — I5023 Acute on chronic systolic (congestive) heart failure: Secondary | ICD-10-CM

## 2016-06-05 HISTORY — PX: BREAST BIOPSY: SHX20

## 2016-06-05 MED ORDER — DIGOXIN 125 MCG PO TABS: 0.1250 mg | ORAL_TABLET | ORAL | 3 refills | Status: DC

## 2016-06-05 MED ORDER — CARVEDILOL 6.25 MG PO TABS: 6.2500 mg | ORAL_TABLET | Freq: Two times a day (BID) | ORAL | 3 refills | Status: DC

## 2016-06-05 MED ORDER — IVABRADINE HCL 5 MG PO TABS: 5.0000 mg | ORAL_TABLET | Freq: Two times a day (BID) | ORAL | 3 refills | Status: DC

## 2016-06-06 ENCOUNTER — Other Ambulatory Visit: Payer: Self-pay | Admitting: *Deleted

## 2016-06-06 NOTE — Patient Outreach (Addendum)
[facility]) Care Management  06/06/2016  [name] 1956-01-27 [id number]  RN Health Coach attempted #1 outreach call to patient.  Patient was unavailable. No voicemail message was left. No voice mail pick up  Plan: RN will call patient again within 14 days.  [facility] Care Management [phone]

## 2016-06-12 ENCOUNTER — Other Ambulatory Visit (HOSPITAL_COMMUNITY): Payer: Self-pay | Admitting: *Deleted

## 2016-06-17 ENCOUNTER — Other Ambulatory Visit (HOSPITAL_COMMUNITY): Payer: Self-pay | Admitting: Cardiology

## 2016-06-17 MED ORDER — APIXABAN 5 MG PO TABS: 5.0000 mg | ORAL_TABLET | Freq: Two times a day (BID) | ORAL | 3 refills | Status: DC

## 2016-06-19 ENCOUNTER — Ambulatory Visit: Payer: Self-pay | Admitting: *Deleted

## 2016-06-20 ENCOUNTER — Telehealth: Payer: Self-pay | Admitting: Internal Medicine

## 2016-06-20 DIAGNOSIS — N6320 Unspecified lump in the left breast, unspecified quadrant: Secondary | ICD-10-CM | POA: Diagnosis not present

## 2016-06-20 DIAGNOSIS — N6324 Unspecified lump in the left breast, lower inner quadrant: Secondary | ICD-10-CM | POA: Diagnosis not present

## 2016-06-20 NOTE — Telephone Encounter (Signed)
Request for surgical clearance:  1. What type of surgery is being performed? Breast Biopsy  2. When is this surgery scheduled? Monday Nov 20  3. Are there any medications that need to be held prior to surgery and how long?Eliquis two days prior  4. Name of physician performing surgery? Dr.Ashley Hawkins  5. What is your office phone and fax number? [phone] fax [phone]

## 2016-06-20 NOTE — Telephone Encounter (Signed)
Request for surgical clearance:  1. What type of surgery is being performed? Breast Biopsy  2. When is this surgery scheduled? Monday Nov 20  3. Are there any medications that need to be held prior to surgery and how long?Eliquis two days prior  4. Name of physician performing surgery? Dr.Ashley Hawkins  5. What is your office phone and fax number? [phone] fax [phone]

## 2016-06-21 NOTE — Telephone Encounter (Signed)
Pt takes Eliquis for afib with CHADS2 score of 3 (CHF, HTN, and DM). Ok to hold Eliquis for 2 days as requested prior to breast biopsy. Clearance faxed to Dr Luan Pulling [phone].

## 2016-06-24 ENCOUNTER — Telehealth (HOSPITAL_COMMUNITY): Payer: Self-pay | Admitting: *Deleted

## 2016-06-24 ENCOUNTER — Other Ambulatory Visit: Payer: Self-pay | Admitting: Radiology

## 2016-06-24 DIAGNOSIS — C50312 Malignant neoplasm of lower-inner quadrant of left female breast: Secondary | ICD-10-CM | POA: Diagnosis not present

## 2016-06-24 DIAGNOSIS — N6324 Unspecified lump in the left breast, lower inner quadrant: Secondary | ICD-10-CM | POA: Diagnosis not present

## 2016-06-24 DIAGNOSIS — C50912 Malignant neoplasm of unspecified site of left female breast: Secondary | ICD-10-CM | POA: Diagnosis not present

## 2016-06-24 NOTE — Telephone Encounter (Signed)
Patient called regarding a change in her pharmacy.   Called patient back and left voicemail asking for her to call us back with her new pharmacy information.

## 2016-06-25 ENCOUNTER — Telehealth (HOSPITAL_COMMUNITY): Payer: Self-pay | Admitting: *Deleted

## 2016-06-25 NOTE — Telephone Encounter (Signed)
Entered in error

## 2016-06-26 ENCOUNTER — Telehealth: Payer: Self-pay | Admitting: *Deleted

## 2016-06-26 ENCOUNTER — Other Ambulatory Visit (HOSPITAL_COMMUNITY): Payer: Self-pay | Admitting: *Deleted

## 2016-06-26 ENCOUNTER — Encounter: Payer: Self-pay | Admitting: *Deleted

## 2016-06-26 DIAGNOSIS — R002 Palpitations: Secondary | ICD-10-CM

## 2016-06-26 DIAGNOSIS — I509 Heart failure, unspecified: Secondary | ICD-10-CM

## 2016-06-26 DIAGNOSIS — I5023 Acute on chronic systolic (congestive) heart failure: Secondary | ICD-10-CM

## 2016-06-26 DIAGNOSIS — C50312 Malignant neoplasm of lower-inner quadrant of left female breast: Secondary | ICD-10-CM

## 2016-06-26 HISTORY — DX: Malignant neoplasm of lower-inner quadrant of left female breast: C50.312

## 2016-06-26 MED ORDER — POTASSIUM CHLORIDE CRYS ER 20 MEQ PO TBCR: 60.0000 meq | EXTENDED_RELEASE_TABLET | Freq: Three times a day (TID) | ORAL | 2 refills | Status: DC

## 2016-06-26 MED ORDER — AMIODARONE HCL 200 MG PO TABS: 200.0000 mg | ORAL_TABLET | Freq: Every day | ORAL | 6 refills | Status: DC

## 2016-06-26 MED ORDER — DIGOXIN 125 MCG PO TABS: 0.1250 mg | ORAL_TABLET | ORAL | 3 refills | Status: DC

## 2016-06-26 MED ORDER — NITROGLYCERIN 0.4 MG SL SUBL: 0.4000 mg | SUBLINGUAL_TABLET | SUBLINGUAL | 3 refills | Status: DC | PRN

## 2016-06-26 NOTE — Progress Notes (Signed)
Received mammogram/ [place] from [facility]. Discussed with Dr. Lindi Adie. Sent to scan

## 2016-06-26 NOTE — Telephone Encounter (Signed)
Confirmed [facility] for 07/03/16 at 1215 .  Instructions and contact information given.

## 2016-06-26 NOTE — Telephone Encounter (Signed)
Left vm for return call to discuss [facility] [place] on 07/03/16. Contact information provided.

## 2016-07-01 ENCOUNTER — Other Ambulatory Visit: Payer: Self-pay | Admitting: *Deleted

## 2016-07-01 DIAGNOSIS — I5022 Chronic systolic (congestive) heart failure: Secondary | ICD-10-CM

## 2016-07-02 ENCOUNTER — Other Ambulatory Visit (HOSPITAL_COMMUNITY): Payer: Self-pay | Admitting: Pharmacist

## 2016-07-02 DIAGNOSIS — J019 Acute sinusitis, unspecified: Secondary | ICD-10-CM | POA: Diagnosis not present

## 2016-07-02 DIAGNOSIS — R002 Palpitations: Secondary | ICD-10-CM

## 2016-07-02 DIAGNOSIS — I509 Heart failure, unspecified: Secondary | ICD-10-CM

## 2016-07-02 DIAGNOSIS — E1140 Type 2 diabetes mellitus with diabetic neuropathy, unspecified: Secondary | ICD-10-CM | POA: Diagnosis not present

## 2016-07-02 MED ORDER — POTASSIUM CHLORIDE CRYS ER 20 MEQ PO TBCR: 60.0000 meq | EXTENDED_RELEASE_TABLET | Freq: Three times a day (TID) | ORAL | 2 refills | Status: DC

## 2016-07-02 MED ORDER — NITROGLYCERIN 0.4 MG SL SUBL: 0.4000 mg | SUBLINGUAL_TABLET | SUBLINGUAL | 3 refills | Status: AC | PRN

## 2016-07-03 ENCOUNTER — Other Ambulatory Visit (HOSPITAL_COMMUNITY): Payer: Self-pay | Admitting: *Deleted

## 2016-07-03 ENCOUNTER — Encounter: Payer: Self-pay | Admitting: Physical Therapy

## 2016-07-03 ENCOUNTER — Ambulatory Visit (HOSPITAL_BASED_OUTPATIENT_CLINIC_OR_DEPARTMENT_OTHER): Payer: Medicare Other | Admitting: Hematology and Oncology

## 2016-07-03 ENCOUNTER — Ambulatory Visit
Admission: RE | Admit: 2016-07-03 | Discharge: 2016-07-03 | Disposition: A | Discharge status: Home / Self Care | Payer: Medicare Other | Source: Ambulatory Visit | Attending: Radiation Oncology | Admitting: Radiation Oncology

## 2016-07-03 ENCOUNTER — Ambulatory Visit: Payer: Medicare Other | Attending: General Surgery | Admitting: Physical Therapy

## 2016-07-03 ENCOUNTER — Other Ambulatory Visit (HOSPITAL_BASED_OUTPATIENT_CLINIC_OR_DEPARTMENT_OTHER): Payer: Medicare Other

## 2016-07-03 ENCOUNTER — Encounter: Payer: Self-pay | Admitting: Hematology and Oncology

## 2016-07-03 DIAGNOSIS — Z9011 Acquired absence of right breast and nipple: Secondary | ICD-10-CM | POA: Diagnosis not present

## 2016-07-03 DIAGNOSIS — C50312 Malignant neoplasm of lower-inner quadrant of left female breast: Secondary | ICD-10-CM

## 2016-07-03 DIAGNOSIS — I509 Heart failure, unspecified: Secondary | ICD-10-CM

## 2016-07-03 DIAGNOSIS — Z171 Estrogen receptor negative status [ER-]: Secondary | ICD-10-CM | POA: Diagnosis not present

## 2016-07-03 DIAGNOSIS — I5022 Chronic systolic (congestive) heart failure: Secondary | ICD-10-CM

## 2016-07-03 DIAGNOSIS — Z95810 Presence of automatic (implantable) cardiac defibrillator: Secondary | ICD-10-CM | POA: Diagnosis not present

## 2016-07-03 DIAGNOSIS — M6281 Muscle weakness (generalized): Secondary | ICD-10-CM

## 2016-07-03 DIAGNOSIS — Z853 Personal history of malignant neoplasm of breast: Secondary | ICD-10-CM | POA: Diagnosis not present

## 2016-07-03 DIAGNOSIS — R293 Abnormal posture: Secondary | ICD-10-CM | POA: Insufficient documentation

## 2016-07-03 DIAGNOSIS — I252 Old myocardial infarction: Secondary | ICD-10-CM | POA: Diagnosis not present

## 2016-07-03 LAB — CBC WITH DIFFERENTIAL/PLATELET
BASO%: 1 % (ref 0.0–2.0)
Basophils Absolute: 0.1 10*3/uL (ref 0.0–0.1)
EOS ABS: 0.2 10*3/uL (ref 0.0–0.5)
EOS%: 2.1 % (ref 0.0–7.0)
HCT: 38.6 % (ref 34.8–46.6)
HEMOGLOBIN: 12.6 g/dL (ref 11.6–15.9)
LYMPH%: 10.9 % — ABNORMAL LOW (ref 14.0–49.7)
MCH: 26.8 pg (ref 25.1–34.0)
MCHC: 32.6 g/dL (ref 31.5–36.0)
MCV: 82.3 fL (ref 79.5–101.0)
MONO#: 0.7 10*3/uL (ref 0.1–0.9)
MONO%: 6.6 % (ref 0.0–14.0)
NEUT%: 79.4 % — ABNORMAL HIGH (ref 38.4–76.8)
NEUTROS ABS: 8.5 10*3/uL — AB (ref 1.5–6.5)
PLATELETS: 344 10*3/uL (ref 145–400)
RBC: 4.69 10*6/uL (ref 3.70–5.45)
RDW: 18.9 % — AB (ref 11.2–14.5)
WBC: 10.7 10*3/uL — AB (ref 3.9–10.3)
lymph#: 1.2 10*3/uL (ref 0.9–3.3)

## 2016-07-03 LAB — COMPREHENSIVE METABOLIC PANEL
ALBUMIN: 3.2 g/dL — AB (ref 3.5–5.0)
ALK PHOS: 83 U/L (ref 40–150)
ALT: 15 U/L (ref 0–55)
AST: 16 U/L (ref 5–34)
Anion Gap: 11 mEq/L (ref 3–11)
BILIRUBIN TOTAL: 0.95 mg/dL (ref 0.20–1.20)
BUN: 23.2 mg/dL (ref 7.0–26.0)
CO2: 30 meq/L — AB (ref 22–29)
CREATININE: 1.1 mg/dL (ref 0.6–1.1)
Calcium: 10.7 mg/dL — ABNORMAL HIGH (ref 8.4–10.4)
Chloride: 93 mEq/L — ABNORMAL LOW (ref 98–109)
EGFR: 52 mL/min/{1.73_m2} — AB (ref >90)
GLUCOSE: 240 mg/dL — AB (ref 70–140)
Potassium: 3.8 mEq/L (ref 3.5–5.1)
SODIUM: 135 meq/L — AB (ref 136–145)
TOTAL PROTEIN: 8.1 g/dL (ref 6.4–8.3)

## 2016-07-03 MED ORDER — FUROSEMIDE 20 MG PO TABS
60.0000 mg | ORAL_TABLET | Freq: Two times a day (BID) | ORAL | 3 refills | Status: DC
Start: 2016-07-03 — End: 2016-07-03

## 2016-07-03 MED ORDER — DIGOXIN 125 MCG PO TABS: 0.1250 mg | ORAL_TABLET | ORAL | 3 refills | Status: DC

## 2016-07-03 MED ORDER — RAMIPRIL 2.5 MG PO CAPS: 2.5000 mg | ORAL_CAPSULE | Freq: Every day | ORAL | 3 refills | Status: DC

## 2016-07-03 MED ORDER — FUROSEMIDE 20 MG PO TABS: 60.0000 mg | ORAL_TABLET | Freq: Two times a day (BID) | ORAL | 0 refills | Status: DC

## 2016-07-03 MED ORDER — POTASSIUM CHLORIDE CRYS ER 20 MEQ PO TBCR: 60.0000 meq | EXTENDED_RELEASE_TABLET | Freq: Three times a day (TID) | ORAL | 0 refills | Status: DC

## 2016-07-03 NOTE — Patient Instructions (Signed)

## 2016-07-03 NOTE — Progress Notes (Signed)
Radiation Oncology         [phone] ________________________________  Initial Outpatient Consultation  Name: [name] MRN: [id number]  Date: 07/03/2016  DOB: [birth year]  HA:[id number],IOXBDZH E, MD  Fanny Skates, MD   REFERRING PHYSICIAN: Fanny Skates, MD   CHIEF COMPLAINTS/PURPOSE OF CONSULTATION:  Newly diagnosed breast cancer  DIAGNOSIS: The encounter diagnosis was Malignant neoplasm of lower-inner quadrant of left breast in female, estrogen receptor negative ([place]).   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 female who has a prior history of right breast cancer, status post right mastectomy in 2004. The patient had a left diagnostic mammogram on 06/20/16 showing a 2 cm irregular mass in the left breast. US showed a 1.2 cm lobulated solid mass in the left breast 7:00 position posterior depth. The mass displayed posterior acoustic enhancement. There was no adenopathy seen in the left axilla.  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  The patient presents today in multidisciplinary breast clinic to discuss treatment options for the management of her disease.  Gynecologic History  Age at first menstrual period? 11  Are you still having periods? No Approximate date of last period? 1995  If you no longer have periods: Have you used hormone replacement? Yes  If YES, for how long? 9 When did you stop? Nov 1985  Obstetric History:  How many children have you carried to term? 1  Pregnant now or trying to get pregnant? No  Have you used birth control pills or hormone shots for contraception? Yes  Would you be interested in learning more about the options to preserve fertility? No Health  Maintenance:  Have you ever had a colonoscopy? Yes If yes, date? Nov 2017  Have you ever had a bone density? Yes If yes, date? 2007  Date of your last PAP smear? 2016 Date of your FIRST mammogram? 2002  PREVIOUS RADIATION THERAPY: Yes. Right breast in 2004 by Dr. Sondra Come.  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    PAST SURGICAL HISTORY: Past Surgical History:  Procedure Laterality Date  . ABDOMINAL HYSTERECTOMY  1991  . BI-VENTRICULAR IMPLANTABLE CARDIOVERTER DEFIBRILLATOR N/A 11/10/2012   SJM Forify Assura single chamber ICD  . CARDIAC CATHETERIZATION N/A 08/21/2015   Procedure: Right/Left Heart Cath and Coronary Angiography;  Surgeon: Jolaine Artist, MD;  Location: [facility];  Service: Cardiovascular;  Laterality: N/A;  . CARDIAC DEFIBRILLATOR PLACEMENT  11/10/2012   SJM Fortify Assura VR implanted by Dr Rayann Heman for primary prevention  . CHOLECYSTECTOMY    . COLONOSCOPY N/A 11/17/2015   Procedure: COLONOSCOPY;  Surgeon: Jerene Bears, MD;  Location: [facility] ENDOSCOPY;  Service: Endoscopy;  Laterality: N/A;  . ENTEROSCOPY N/A 03/26/2016   Procedure: ENTEROSCOPY;  Surgeon: Renelda Loma  Havery Moros, MD;  Location: [facility]COPY;  Service: Gastroenterology;  Laterality: N/A;  . ESOPHAGOGASTRODUODENOSCOPY N/A 11/16/2015   Procedure: ESOPHAGOGASTRODUODENOSCOPY (EGD);  Surgeon: Manus Gunning, MD;  Location: [facility];  Service: Gastroenterology;  Laterality: N/A;  . ESOPHAGOGASTRODUODENOSCOPY N/A 02/24/2016   Procedure: ESOPHAGOGASTRODUODENOSCOPY (EGD);  Surgeon: Milus Banister, MD;  Location: [facility];  Service: Endoscopy;  Laterality: N/A;  . ESOPHAGOGASTRODUODENOSCOPY N/A 03/26/2016   Procedure: ESOPHAGOGASTRODUODENOSCOPY (EGD);  Surgeon: Manus Gunning, MD;  Location: [facility];  Service: Gastroenterology;  Laterality: N/A;  . GIVENS CAPSULE STUDY N/A 12/11/2015   Procedure: GIVENS CAPSULE STUDY;  Surgeon: Manus Gunning, MD;  Location: [facility];  Service: Gastroenterology;  Laterality: N/A;  . KNEE CARTILAGE SURGERY    . MASTECTOMY     right  . WRIST SURGERY Bilateral     FAMILY HISTORY: family history includes Breast cancer in her cousin and cousin; Colon cancer in her maternal aunt and maternal aunt; Congestive Heart Failure in her mother; Coronary artery disease in her father; Diabetes Mellitus II in her father and mother; Hypertension in her father; Lung cancer in her maternal aunt and mother; Uterine cancer in her cousin, maternal aunt, and maternal aunt.  SOCIAL HISTORY:  reports that she quit smoking about 15 years ago. She has never used smokeless tobacco. She reports that she drinks alcohol. She reports that she does not use drugs.  ALLERGIES: Bee venom; Aspirin; Ciprofloxacin; Codeine; Naproxen; Other; Sulfonamide derivatives; Xarelto [rivaroxaban]; Atorvastatin; Nsaids; and Tape  MEDICATIONS:  Current Outpatient Prescriptions  Medication Sig Dispense Refill  . acetaminophen (TYLENOL) 650 MG CR tablet Take 1,300 mg by mouth every 8 (eight) hours as needed for pain.    Marland Kitchen albuterol (PROVENTIL HFA) 108 (90 BASE) MCG/ACT inhaler  Inhale 2 puffs into the lungs every 6 (six) hours as needed. For shortness of breath    . ALPRAZolam (XANAX) 0.5 MG tablet Take 0.5 mg by mouth daily as needed for anxiety.     Marland Kitchen amiodarone (PACERONE) 200 MG tablet Take 1 tablet (200 mg total) by mouth daily. 30 tablet 6  . apixaban (ELIQUIS) 5 MG TABS tablet Take 1 tablet (5 mg total) by mouth 2 (two) times daily. 180 tablet 3  . baclofen (LIORESAL) 20 MG tablet Take 20 mg by mouth 2 (two) times daily as needed for muscle spasms.    . busPIRone (BUSPAR) 30 MG tablet Take 30 mg by mouth 2 (two) times daily.     . carvedilol (COREG) 6.25 MG tablet Take 1 tablet (6.25 mg total) by mouth 2 (two) times daily with a meal. 180 tablet 3  . digoxin (LANOXIN) 0.125 MG tablet Take 1 tablet (0.125 mg total) by mouth every other day. 135 tablet 3  . docusate sodium (COLACE) 100 MG capsule Take 3 capsules (300 mg total) by mouth at bedtime. 10 capsule 0  . DULoxetine (CYMBALTA) 30 MG capsule Take 30 mg by mouth daily.    Marland Kitchen Fe Cbn-Fe Gluc-FA-B12-C-DSS (FERRALET 90) 90-1 MG TABS Take 1 tablet by mouth daily. (Patient not taking: Reported on 07/03/2016) 30 each 0  . furosemide (LASIX) 20 MG tablet Take 3 tablets (60 mg total) by mouth 2 (two) times daily. If 1 to 2 pound weight gain in a day, take extra 20 mg dose 180 tablet 0  . Garlic 952 MG CAPS Take 500 mg by mouth daily.     . Glucosamine 500 MG CAPS Take 500 mg by mouth daily.     Marland Kitchen HUMALOG  KWIKPEN 100 UNIT/ML KiwkPen Inject 24 Units into the skin 3 (three) times daily. Sliding scale TID    . HUMALOG MIX 75/25 KWIKPEN (75-25) 100 UNIT/ML Kwikpen Inject 75 Units into the skin 2 (two) times daily.    . hydrOXYzine (ATARAX/VISTARIL) 50 MG tablet Take 50 mg by mouth every 6 (six) hours as needed for itching.    . ivabradine (CORLANOR) 5 MG TABS tablet Take 1 tablet (5 mg total) by mouth 2 (two) times daily with a meal. 180 tablet 3  . levothyroxine (SYNTHROID, LEVOTHROID) 75 MCG tablet Take 75 mcg by mouth daily  before breakfast.     . Melatonin 1 MG TABS Take 0.5 mg by mouth at bedtime.    . metolazone (ZAROXOLYN) 2.5 MG tablet Take 2.5 mg by mouth as needed (for weight gain >3 lbs).    . mometasone (NASONEX) 50 MCG/ACT nasal spray Place 1 spray into both nostrils daily. allergies    . Multiple Minerals (CALCIUM-MAGNESIUM-ZINC) TABS Take 1 tablet by mouth daily.     . nitroGLYCERIN (NITROSTAT) 0.4 MG SL tablet Place 1 tablet (0.4 mg total) under the tongue every 5 (five) minutes as needed. For chest pain 25 tablet 3  . pantoprazole (PROTONIX) 40 MG tablet Take 40 mg by mouth 2 (two) times daily.      . polyethylene glycol powder (GLYCOLAX/MIRALAX) powder Mix 17 grams in 8 oz of water twice daily 850 g 3  . potassium chloride SA (K-DUR,KLOR-CON) 20 MEQ tablet Take 3 tablets (60 mEq total) by mouth 3 (three) times daily. With an additional 40 meq on Metolazone 270 tablet 0  . pyridOXINE (VITAMIN B-6) 100 MG tablet Take 100 mg by mouth daily.    . ramipril (ALTACE) 2.5 MG capsule Take 1 capsule (2.5 mg total) by mouth daily. 90 capsule 3  . rOPINIRole (REQUIP) 1 MG tablet Take 1 mg by mouth at bedtime.      . rosuvastatin (CRESTOR) 40 MG tablet Take 40 mg by mouth daily.    . Simethicone Extra Strength 125 MG CAPS Take 1 tablet by mouth daily as needed (for bloating). Reported on 02/23/2016    . spironolactone (ALDACTONE) 25 MG tablet Take 1 tablet (25 mg total) by mouth daily. 30 tablet 3  . SUMAtriptan (IMITREX) 100 MG tablet Take 1 tablet by mouth daily as needed for migraine.     . tiotropium (SPIRIVA) 18 MCG inhalation capsule Place 18 mcg into inhaler and inhale daily.      . vitamin B-12 (CYANOCOBALAMIN) 1000 MCG tablet Take 1,000 mcg by mouth daily.    . vitamin C (ASCORBIC ACID) 500 MG tablet Take 500 mg by mouth daily.    . vitamin E 100 UNIT capsule Take 400 Units by mouth daily.     No current facility-administered medications for this encounter.     REVIEW OF SYSTEMS:  A 15 point review of  systems is documented in the electronic medical record. This was obtained by the nursing staff. However, I reviewed this with the patient to discuss relevant findings and make appropriate changes.  Pertinent items noted in HPI and remainder of comprehensive ROS otherwise negative.  The patient reports fever, chills, loss of sleep, fatigue, knee pain, wears glasses, hearing loss, sinus problems, wears dentures, irregular heartbeat, palpitations, foot swelling, SOB, sleeps on more than one pillow, dry and productive cough, nausea/vomiting, heartburn, hernia, change in stool color, rectal bleeding, dribbling urine, left breast pain, skin rash, bruise easily, back pain, joint pain,  arthritis, walking difficulty, seizures, headaches, numbness, anxiety, depression, diabetes, thyroid problem, blood transfusions, anemia, and a past history of chemotherapy.  PHYSICAL EXAM: Vitals with BMI 07/03/2016  Height '5\' 5"'$   Weight 215 lbs 10 oz  BMI 36  Systolic 810  Diastolic 49  Pulse 92  Respirations 18  General: Alert and oriented, in no acute distress  Neck: Neck is supple, no palpable cervical or supraclavicular lymphadenopathy. Heart: Regular in rate and rhythm with no murmurs, rubs, or gallops. Chest: Clear to auscultation bilaterally, with no rhonchi, wheezes, or rales. Abdomen: Soft, nontender, nondistended, with no rigidity or guarding. Lymphatics: see Neck Exam Psychiatric: Judgment and insight are intact. Affect is appropriate. Breast Exam: Right chest mastectomy scar without palpable or visible sign of recurrence. Left breast in the sitting position, there appears to be a 1 cm palpable nodule in the inferior breast, about 6-7:00 position, no nipple discharge or bleeding. The patient has a scar in the UIQ left breast from a prior benign excisional biopsy.   ECOG = 1  LABORATORY DATA:  Lab Results  Component Value Date   WBC 10.7 (H) 07/03/2016   HGB 12.6 07/03/2016   HCT 38.6 07/03/2016    MCV 82.3 07/03/2016   PLT 344 07/03/2016   NEUTROABS 8.5 (H) 07/03/2016   Lab Results  Component Value Date   NA 135 (L) 07/03/2016   K 3.8 07/03/2016   CL 94 (L) 05/27/2016   CO2 30 (H) 07/03/2016   GLUCOSE 240 (H) 07/03/2016   CREATININE 1.1 07/03/2016   CALCIUM 10.7 (H) 07/03/2016      RADIOGRAPHY: No results found.   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 She also mentioned immediate reconstruction of the left breast and cosmetic surgery on the right side at the same time. However, Dr. Dalbert Batman spoke with the patient and would like her to undergo a left lumpectomy given the patient's comorbidities.  I briefly spoke about radiation therapy to the patient and she is aware of the process, benefits, and side-effects given she had radiation to the right breast in the past.  PLAN: At this time, the patient may either undergo a left mastectomy and sentinel lymph node biopsy along with reconstruction or a left lumpectomy and sentinel lymph node biopsy. If the patient undergoes a mastectomy, then there would be no need to perform radiation at this time unless she has a larger tumor than anticipated or lymphadenopathy. If the patient has a lumpectomy, then she would undergo radiation as breast conservation therapy. The patient is also a candidate for genetic counseling. I will see the patient at a later date if she undergoes a left lumpectomy to further discuss radiation therapy.     ------------------------------------------------  Blair Promise, PhD, MD  This document serves as a record of services personally performed by Gery Pray, MD. It was created on his behalf by Darcus Austin, a trained medical scribe. The creation of this record is based on the scribe's personal observations and the provider's  statements to them. This document has been checked and approved by the attending provider.

## 2016-07-03 NOTE — Therapy (Signed)
[place], [place], [address] Phone: [phone]   Fax:  [phone]  Physical Therapy Evaluation  Patient Details  Name: [name] MRN: [id number] Date of Birth: May 30, 1956 Referring Provider: Dr. Fanny Skates  Encounter Date: 07/03/2016      PT End of Session - 07/03/16 1616    Visit Number 1   Number of Visits 1   PT Start Time 1505   PT Stop Time 6979  Also saw pt from 1405-1421 for a total of 25 minutes   PT Time Calculation (min) 9 min   Activity Tolerance Patient tolerated treatment well   Behavior During Therapy [facility] for tasks assessed/performed      Past Medical History:  Diagnosis Date  . AICD (automatic cardioverter/defibrillator) present   . Anxiety   . 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 Asthma   . CHF (congestive heart failure) ([place])   . COPD (chronic obstructive pulmonary disease) ([place])   . Depression    followed by a psychiatrist  . DYSLIPIDEMIA 05/01/2009  . Gastroparesis   . GERD (gastroesophageal reflux disease)   . History of breast cancer 2006   with return in 2008, s/p mastectomy, XRT and chemotherapy  . Hypertension   . 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 IBS (irritable bowel syndrome)   . ICD (implantable cardiac defibrillator) in place 11/10/2012   ICD (11/2012)  . Malignant neoplasm of lower-inner quadrant of left female breast ([place]) 06/26/2016  . Myocardial infarction   . OBESITY 07/24/2007  . OBSTRUCTIVE SLEEP APNEA 07/24/2007   with CPAP compliance  . Osteoarthritis   . Persistent atrial fibrillation ([place]) 09/2014  . Presence of permanent cardiac pacemaker   . RESTLESS LEG SYNDROME 07/24/2007  . SYSTOLIC HEART FAILURE, CHRONIC 01/05/2009   a. chemo induced cardiomyopathy b. cMRI (02/2009) EF 45% c. Myoview 05/2010: EF 59%, no ischemia d. RHC (01/2012) RA 20, RV 55/13/22, PA 56/34 (44), PCWP 28, Fick CO/CI: 3.1 / 1.5, PVR 5.3 WU, PA sat 42% & 48% e. ECHO (10/2012) EF  20-25%, diff HK, MV calcified annulus f. ECHO (03/2014) EF 30-35%, sev HK, inferior/inferoseptal walls, mild MR    Past Surgical History:  Procedure Laterality Date  . ABDOMINAL HYSTERECTOMY  1991  . BI-VENTRICULAR IMPLANTABLE CARDIOVERTER DEFIBRILLATOR N/A 11/10/2012   SJM Forify Assura single chamber ICD  . CARDIAC CATHETERIZATION N/A 08/21/2015   Procedure: Right/Left Heart Cath and Coronary Angiography;  Surgeon: Jolaine Artist, MD;  Location: [facility];  Service: Cardiovascular;  Laterality: N/A;  . CARDIAC DEFIBRILLATOR PLACEMENT  11/10/2012   SJM Fortify Assura VR implanted by Dr Rayann Heman for primary prevention  . CHOLECYSTECTOMY    . COLONOSCOPY N/A 11/17/2015   Procedure: COLONOSCOPY;  Surgeon: Jerene Bears, MD;  Location: [facility] ENDOSCOPY;  Service: Endoscopy;  Laterality: N/A;  . ENTEROSCOPY N/A 03/26/2016   Procedure: ENTEROSCOPY;  Surgeon: Manus Gunning, MD;  Location: [facility] ENDOSCOPY;  Service: Gastroenterology;  Laterality: N/A;  . ESOPHAGOGASTRODUODENOSCOPY N/A 11/16/2015   Procedure: ESOPHAGOGASTRODUODENOSCOPY (EGD);  Surgeon: Manus Gunning, MD;  Location: [facility];  Service: Gastroenterology;  Laterality: N/A;  . ESOPHAGOGASTRODUODENOSCOPY N/A 02/24/2016   Procedure: ESOPHAGOGASTRODUODENOSCOPY (EGD);  Surgeon: Milus Banister, MD;  Location: [facility];  Service: Endoscopy;  Laterality: N/A;  . ESOPHAGOGASTRODUODENOSCOPY N/A 03/26/2016   Procedure: ESOPHAGOGASTRODUODENOSCOPY (EGD);  Surgeon: Manus Gunning, MD;  Location: [facility];  Service: Gastroenterology;  Laterality: N/A;  . GIVENS CAPSULE STUDY N/A 12/11/2015   Procedure: GIVENS CAPSULE STUDY;  Surgeon: Steven Paul Armbruster, MD;  Location: [facility] ENDOSCOPY;  Service: Gastroenterology;  Laterality: N/A;  . KNEE CARTILAGE SURGERY    . MASTECTOMY     right  . WRIST SURGERY Bilateral     There were no vitals filed for this visit.       Subjective Assessment - 07/03/16 1606     Subjective Patient reports she is here to be seen by her medical team for her newly diagnosed breast cancer.   Pertinent History Patient was diagnosed on 06/26/16 with left triple negative breast cancer.  It measures 1.2 cm and is located in the lower outer quadrant.   Patient Stated Goals Reduce lymphedema risk and learn post op shoulder ROM HEP   Currently in Pain? Yes   Pain Score 9    Pain Location Knee   Pain Orientation Left   Pain Descriptors / Indicators Aching   Pain Type Chronic pain   Pain Onset More than a month ago   Pain Frequency Intermittent   Aggravating Factors  Walking   Pain Relieving Factors Unknown   Multiple Pain Sites Yes  Chronic low back pain which varies in description            [facility] PT Assessment - 07/03/16 0001      Assessment   Medical Diagnosis Left breast cancer   Referring Provider Dr. Haywood Ingram   Onset Date/Surgical Date 06/26/16   Hand Dominance Right   Prior Therapy none     Precautions   Precautions Other (comment)   Precaution Comments active breast cancer; CHF; A-fib     Restrictions   Weight Bearing Restrictions No     Balance Screen   Has the patient fallen in the past 6 months No   Has the patient had a decrease in activity level because of a fear of falling?  No   Is the patient reluctant to leave their home because of a fear of falling?  No     Home Environment   Living Environment Private residence   Living Arrangements Alone   Available Help at Discharge Friend(s)     Prior Function   Level of Independence Independent   Vocation On disability   Leisure She has recently joined the [employer] and plans ot use the pool and recumbant bike     Cognition   Overall Cognitive Status Within Functional Limits for tasks assessed     Posture/Postural Control   Posture/Postural Control Postural limitations   Postural Limitations Rounded Shoulders;Forward head     ROM / Strength   AROM / PROM / Strength AROM;Strength      AROM   AROM Assessment Site Shoulder;Cervical   Right/Left Shoulder Right;Left   Right Shoulder Extension 37 Degrees   Right Shoulder Flexion 145 Degrees   Right Shoulder ABduction 168 Degrees   Right Shoulder Internal Rotation 67 Degrees   Right Shoulder External Rotation 80 Degrees   Left Shoulder Extension 35 Degrees   Left Shoulder Flexion 151 Degrees   Left Shoulder ABduction 163 Degrees   Left Shoulder Internal Rotation 74 Degrees   Left Shoulder External Rotation 78 Degrees   Cervical Flexion WNL   Cervical Extension 25% limited   Cervical - Right Side Bend 25% limited   Cervical - Left Side Bend 25% limited   Cervical - Right Rotation 25% limited   Cervical - Left Rotation 25% limited     Strength   Overall Strength Within functional limits for tasks performed             LYMPHEDEMA/ONCOLOGY QUESTIONNAIRE - 07/03/16 1614      Type   Cancer Type Left breast cancer     Lymphedema Assessments   Lymphedema Assessments Upper extremities     Right Upper Extremity Lymphedema   10 cm Proximal to Olecranon Process 30.5 cm   Olecranon Process 26 cm   10 cm Proximal to Ulnar Styloid Process 23.6 cm   Just Proximal to Ulnar Styloid Process 16.7 cm   Across Hand at Thumb Web Space 18.1 cm   At Base of 2nd Digit 6.8 cm     Left Upper Extremity Lymphedema   10 cm Proximal to Olecranon Process 34.9 cm   Olecranon Process 28.1 cm   10 cm Proximal to Ulnar Styloid Process 23.2 cm   Just Proximal to Ulnar Styloid Process 16.8 cm   Across Hand at Thumb Web Space 18.3 cm   At Base of 2nd Digit 6.6 cm              Patient was instructed today in a home exercise program today for post op shoulder range of motion. These included active assist shoulder flexion in sitting, scapular retraction, wall walking with shoulder abduction, and hands behind head external rotation.  She was encouraged to do these twice a day, holding 3 seconds and repeating 5 times when permitted by her  physician.              PT Education - 07/03/16 1615    Education provided Yes   Education Details Lymphedema risk reduction and post op shoulder ROM HEP   Person(s) Educated Patient;Caregiver(s)   Methods Explanation;Demonstration;Handout   Comprehension Returned demonstration;Verbalized understanding              Breast Clinic Goals - 07/03/16 1621      Patient will be able to verbalize understanding of pertinent lymphedema risk reduction practices relevant to her diagnosis specifically related to skin care.   Baseline No knowledge   Time 1   Period Days   Status Achieved     Patient will be able to return demonstrate and/or verbalize understanding of the post-op home exercise program related to regaining shoulder range of motion.   Baseline No knowledge   Time 1   Period Days   Status Achieved     Patient will be able to verbalize understanding of the importance of attending the postoperative After Breast Cancer Class for further lymphedema risk reduction education and therapeutic exercise.   Baseline No knowledge   Time 1   Period Days   Status Achieved              Plan - 07/03/16 1617    Clinical Impression Statement Patient was diagnosed on 06/26/16 with left triple negative breast cancer.  It measures 1.2 cm and is located in the lower outer quadrant.  Her multidisciplinary medical team met prior to her assessments to determine a recommended treatment plan.  She is planning to have a left lumpectomy and sentinel node biopsy followed by radiation.  She is considering a mastectomy with reconstruction but may not be a good candidate due to comorbidities.  Due to her previous right breast cancer and mastectomy along with multiple cardiac conditions, her eval is of moderate complexity as her condition is evolving.  She may benefit from post op PT to regian shoudler ROM and reduce lymphedema risk.   Rehab Potential Good   Clinical Impairments Affecting  Rehab Potential Comorbidities and obesity   PT Frequency One time   visit   PT Treatment/Interventions Patient/family education;Therapeutic exercise   PT Next Visit Plan Will f/u after surgery to determine a recommended treatment plan.   PT Home Exercise Plan Post op shoulder ROM HEP   Consulted and Agree with Plan of Care Patient;Family member/caregiver   Family Member Consulted Friend      Patient will benefit from skilled therapeutic intervention in order to improve the following deficits and impairments:  Postural dysfunction, Decreased knowledge of precautions, Pain, Impaired UE functional use, Decreased range of motion  Visit Diagnosis: Carcinoma of lower-inner quadrant of left breast in female, estrogen receptor negative (HCC) - Plan: PT plan of care cert/re-cert  Abnormal posture - Plan: PT plan of care cert/re-cert  Muscle weakness (generalized) - Plan: PT plan of care cert/re-cert      G-Codes - 07/03/16 1622    Functional Assessment Tool Used Clinical Judgement   Functional Limitation Other PT primary   Other PT Primary Current Status (G8990) At least 20 percent but less than 40 percent impaired, limited or restricted   Other PT Primary Discharge Status (G8992) At least 20 percent but less than 40 percent impaired, limited or restricted     Patient will follow up at outpatient cancer rehab if needed following surgery.  If the patient requires physical therapy at that time, a specific plan will be dictated and sent to the referring physician for approval. The patient was educated today on appropriate basic range of motion exercises to begin post operatively and the importance of attending the After Breast Cancer class following surgery.  Patient was educated today on lymphedema risk reduction practices as it pertains to recommendations that will benefit the patient immediately following surgery.  She verbalized good understanding.  No additional physical therapy is indicated at  this time.     Problem List Patient Active Problem List   Diagnosis Date Noted  . Malignant neoplasm of lower-inner quadrant of left female breast ([facility]) 06/26/2016  . Small bowel polyp   . CHF (congestive heart failure) ([facility]) 12/21/2015  . Acute blood loss anemia   . Atrial fibrillation ([facility]) 12/11/2015  . Benign neoplasm of descending colon   . Upper GI bleeding   . Iron deficiency anemia due to chronic blood loss   . Acute upper GI bleeding 11/14/2015  . Anemia 11/14/2015  . Absolute anemia   . Symptomatic anemia   . Bleeding gastrointestinal   . Chronic systolic CHF (congestive heart failure), NYHA class 2 ([facility])   . PAF (paroxysmal atrial fibrillation) ([facility]) 09/22/2015  . Chest pain at rest 08/19/2015  . Chest pain 08/19/2015  . NSVT (nonsustained ventricular tachycardia) ([facility]) 06/30/2015  . Breast cancer of lower-outer quadrant of right female breast ([facility]) 07/22/2014  . CAD (coronary artery disease) 02/17/2014  . Type 1 diabetes mellitus with neurological manifestations ([facility]) 01/06/2013  . PALPITATIONS 09/20/2010  . Mixed hypercholesterolemia and hypertriglyceridemia 05/01/2009  . HYPERTENSION, BENIGN 01/05/2009  . FOOT PAIN 11/29/2008  . Sinus tachycardia 10/25/2008  . 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 Obesity 07/24/2007  . Obstructive sleep apnea 07/24/2007  . RESTLESS LEG SYNDROME 07/24/2007   Marti Cooper Smith, PT 07/03/16 4:24 PM  [facility] [address] , [place], [address] Phone: [phone]   Fax:  [phone]  Name: [name] MRN: [id number] Date of Birth: 09/17/1955   

## 2016-07-03 NOTE — Progress Notes (Signed)
ONCBCN DISTRESS SCREENING 07/03/2016  Screening Type Initial Screening  Distress experienced in past week (1-10) 4  Family Problem type Children  Emotional problem type Depression;Nervousness/Anxiety;Adjusting to illness;Adjusting to appearance changes  Information Concerns Type Lack of info about diagnosis;Lack of info about treatment;Lack of info about complementary therapy choices;Lack of info about maintaining fitness  Physical Problem type Pain;Nausea/vomiting;Sleep/insomnia;Getting around;Breathing;Constipation/diarrhea;Tingling hands/feet;Skin dry/itchy;Swollen arms/legs  Referral to support programs Yes   Met with patient in Breast Multidisciplinary Clinic to introduce [place] team/resources, reviewing distress screen per protocol.  The patient scored a 4 on the Psychosocial Distress Thermometer which indicates moderate distress. Also assessed for distress and other psychosocial needs.  Patient attended clinic with her best friend, whom she has known since they were in high school. Patient shared that this was her third time going through breast cancer but this time was a different type, and would likely require a different treatment than before. Patient mentioned her faith as being a source of support as well as her best friend.  Patient did express an interest in talking with the counselor, or another member of the support team, throughout her treatment so that she doesn't "burden" her friend. Patient said that she had not sought emotional support in her previous cancer-journeys and wished that she had. Counselor left contact information and packet of other support center services.  Rosana Fret, Counseling Intern Supervisor, Lorrin Jackson, Lead Chaplain

## 2016-07-03 NOTE — Progress Notes (Signed)
[place] CONSULT NOTE  Patient Care Team: Nicoletta Dress, MD as PCP - General (Internal Medicine) Verlin Grills, RN as [facility]  CHIEF COMPLAINTS/PURPOSE OF CONSULTATION:  Newly diagnosed breast cancer  HISTORY OF PRESENTING ILLNESS:  [name] 60 y.o. female is here because of recent diagnosis of left breast spindle cell carcinoma. Patient had a previous history of right breast cancer in 2005 that was treated with lumpectomy followed by adjuvant chemotherapy with FEC. Because of this she had congestive heart failure. Subsequently she received adjuvant radiation. 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 She relapsed in the same breast in 2008 and was treated with mastectomy followed by adjuvant chemotherapy with Taxotere and Cytoxan.  She came in for a routine mammogram on the left breast which then revealed an abnormality which was biopsy-proven to be spindle cell carcinoma. 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 She was presented this morning in the multidisciplinary tumor board and she is here today to discuss the treatment plan.  I reviewed her records extensively and collaborated the history with the patient.  SUMMARY OF ONCOLOGIC HISTORY:   Breast cancer of lower-outer quadrant of right female breast ([place])   06/13/2004 Surgery    Right breast lumpectomy: Invasive ductal carcinoma, 1.7 cm, grade 3 with high-grade DCIS, 3 lymph nodes negative      07/16/2004 - 10/15/2004 Chemotherapy    Adjuvant chemotherapy with FEC. 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      10/2004 - 11/2004 Radiation Therapy    Adjuvant RT ([place])      08/12/2006 Relapse/Recurrence    Relapsed disease in the right breast      08/27/2006 Surgery    Right breast mastectomy: Tumor size 0.4 cm margins negative, ER 0%, PR 0%, HER-2 negative, Ki-67 13%      08/27/2006 Pathologic Stage    Stage IA: pT1a pNx        Malignant neoplasm of lower-inner quadrant of left female breast ([place])   06/24/2016 Initial Diagnosis    Left breast bx 7:00: Malignant spindle cell carcinoma, ADH, pos for CK 8/18, CK 903, CK 5/6, CK AE1/AE3, neg for smooth muscle actin, CD34, desmin, calponin, E-cadherin, ER 0%, PR 0%, Ki-67 20%, HER-2 neg ratio 1.21; 1.2 cm, T1 CN 0 stage IA      MEDICAL HISTORY:  Past Medical History:  Diagnosis Date  . AICD (automatic cardioverter/defibrillator) present   . Anxiety   . 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 Asthma   . CHF (congestive heart failure) ([place])   . COPD (chronic obstructive pulmonary disease) ([place])   . Depression    followed by a psychiatrist  . DYSLIPIDEMIA 05/01/2009  . Gastroparesis   . GERD (gastroesophageal reflux disease)   . History of breast cancer 2006   with return in 2008, s/p mastectomy, XRT and chemotherapy  . Hypertension   . 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 IBS (irritable bowel syndrome)   . ICD (implantable cardiac defibrillator) in place 11/10/2012   ICD (11/2012)  . Malignant neoplasm of lower-inner quadrant of left female breast ([place]) 06/26/2016  . Myocardial infarction   . OBESITY 07/24/2007  . OBSTRUCTIVE SLEEP APNEA 07/24/2007   with CPAP compliance  . Osteoarthritis   . Persistent atrial fibrillation ([place]) 09/2014  . Presence of permanent cardiac pacemaker   . RESTLESS LEG SYNDROME 07/24/2007  . SYSTOLIC HEART FAILURE, CHRONIC 01/05/2009   a. chemo induced cardiomyopathy b. cMRI (02/2009) EF 45% c. Myoview 05/2010:  EF 59%, no ischemia d. RHC (01/2012) RA 20, RV 55/13/22, PA 56/34 (44), PCWP 28, Fick CO/CI: 3.1 / 1.5, PVR 5.3 WU, PA sat 42% & 48% e. ECHO (10/2012) EF 20-25%, diff HK, MV calcified annulus f. ECHO (03/2014) EF 30-35%, sev HK, inferior/inferoseptal walls, mild MR    SURGICAL HISTORY: Past Surgical History:  Procedure Laterality Date  . ABDOMINAL HYSTERECTOMY  1991  . BI-VENTRICULAR IMPLANTABLE CARDIOVERTER DEFIBRILLATOR N/A 11/10/2012   SJM Forify  Assura single chamber ICD  . CARDIAC CATHETERIZATION N/A 08/21/2015   Procedure: Right/Left Heart Cath and Coronary Angiography;  Surgeon: Jolaine Artist, MD;  Location: [facility];  Service: Cardiovascular;  Laterality: N/A;  . CARDIAC DEFIBRILLATOR PLACEMENT  11/10/2012   SJM Fortify Assura VR implanted by Dr Rayann Heman for primary prevention  . CHOLECYSTECTOMY    . COLONOSCOPY N/A 11/17/2015   Procedure: COLONOSCOPY;  Surgeon: Jerene Bears, MD;  Location: [facility] END[facility];  Service: Endoscopy;  Laterality: N/A;  . ENTEROSCOPY N/A 03/26/2016   Procedure: ENTEROSCOPY;  Surgeon: Manus Gunning, MD;  Location: [facility] ENDOSCOPY;  Service: Gastroenterology;  Laterality: N/A;  . ESOPHAGOGASTRODUODENOSCOPY N/A 11/16/2015   Procedure: ESOPHAGOGASTRODUODENOSCOPY (EGD);  Surgeon: Manus Gunning, MD;  Location: [facility];  Service: Gastroenterology;  Laterality: N/A;  . ESOPHAGOGASTRODUODENOSCOPY N/A 02/24/2016   Procedure: ESOPHAGOGASTRODUODENOSCOPY (EGD);  Surgeon: Milus Banister, MD;  Location: [facility];  Service: Endoscopy;  Laterality: N/A;  . ESOPHAGOGASTRODUODENOSCOPY N/A 03/26/2016   Procedure: ESOPHAGOGASTRODUODENOSCOPY (EGD);  Surgeon: Manus Gunning, MD;  Location: [facility];  Service: Gastroenterology;  Laterality: N/A;  . GIVENS CAPSULE STUDY N/A 12/11/2015   Procedure: GIVENS CAPSULE STUDY;  Surgeon: Manus Gunning, MD;  Location: [facility];  Service: Gastroenterology;  Laterality: N/A;  . KNEE CARTILAGE SURGERY    . MASTECTOMY     right  . WRIST SURGERY Bilateral     SOCIAL HISTORY: Social History   Social History  . Marital status: Divorced    Spouse name: N/A  . Number of children: N/A  . Years of education: N/A   Occupational History  . Disabled Unemployed   Social History Main Topics  . Smoking status: Former Smoker    Quit date: 04/12/2001  . Smokeless tobacco: Never Used  . Alcohol use 0.0 oz/week  . Drug use: No  . Sexual  activity: Not Currently   Other Topics Concern  . Not on file   Social History Narrative   Lives with mother and brother Philis Nettle          FAMILY HISTORY: Family History  Problem Relation Age of Onset  . Diabetes Mellitus II Mother   . Lung cancer Mother   . Congestive Heart Failure Mother   . Coronary artery disease Father     CABG x 3  . Hypertension Father   . Diabetes Mellitus II Father   . Breast cancer Cousin   . Breast cancer Cousin   . Uterine cancer Cousin   . Colon cancer Maternal Aunt   . Uterine cancer Maternal Aunt   . Lung cancer Maternal Aunt   . Uterine cancer Maternal Aunt   . Colon cancer Maternal Aunt     ALLERGIES:  is allergic to bee venom; aspirin; ciprofloxacin; codeine; naproxen; other; sulfonamide derivatives; xarelto [rivaroxaban]; atorvastatin; nsaids; and tape.  MEDICATIONS:  Current Outpatient Prescriptions  Medication Sig Dispense Refill  . acetaminophen (TYLENOL) 650 MG CR tablet Take 1,300 mg by mouth every 8 (eight) hours as needed for pain.    Marland Kitchen  albuterol (PROVENTIL HFA) 108 (90 BASE) MCG/ACT inhaler Inhale 2 puffs into the lungs every 6 (six) hours as needed. For shortness of breath    . ALPRAZolam (XANAX) 0.5 MG tablet Take 0.5 mg by mouth daily as needed for anxiety.     . amiodarone (PACERONE) 200 MG tablet Take 1 tablet (200 mg total) by mouth daily. 30 tablet 6  . apixaban (ELIQUIS) 5 MG TABS tablet Take 1 tablet (5 mg total) by mouth 2 (two) times daily. 180 tablet 3  . baclofen (LIORESAL) 20 MG tablet Take 20 mg by mouth 2 (two) times daily as needed for muscle spasms.    . busPIRone (BUSPAR) 30 MG tablet Take 30 mg by mouth 2 (two) times daily.     . carvedilol (COREG) 6.25 MG tablet Take 1 tablet (6.25 mg total) by mouth 2 (two) times daily with a meal. 180 tablet 3  . digoxin (LANOXIN) 0.125 MG tablet Take 1 tablet (0.125 mg total) by mouth every other day. 135 tablet 3  . docusate sodium (COLACE) 100 MG capsule Take 3  capsules (300 mg total) by mouth at bedtime. 10 capsule 0  . DULoxetine (CYMBALTA) 30 MG capsule Take 30 mg by mouth daily.    . Glucosamine 500 MG CAPS Take 500 mg by mouth daily.     . HUMALOG KWIKPEN 100 UNIT/ML KiwkPen Inject 24 Units into the skin 3 (three) times daily. Sliding scale TID    . HUMALOG MIX 75/25 KWIKPEN (75-25) 100 UNIT/ML Kwikpen Inject 75 Units into the skin 2 (two) times daily.    . hydrOXYzine (ATARAX/VISTARIL) 50 MG tablet Take 50 mg by mouth every 6 (six) hours as needed for itching.    . ivabradine (CORLANOR) 5 MG TABS tablet Take 1 tablet (5 mg total) by mouth 2 (two) times daily with a meal. 180 tablet 3  . levothyroxine (SYNTHROID, LEVOTHROID) 75 MCG tablet Take 75 mcg by mouth daily before breakfast.     . Melatonin 1 MG TABS Take 0.5 mg by mouth at bedtime.    . metolazone (ZAROXOLYN) 2.5 MG tablet Take 2.5 mg by mouth as needed (for weight gain >3 lbs).    . mometasone (NASONEX) 50 MCG/ACT nasal spray Place 1 spray into both nostrils daily. allergies    . Multiple Minerals (CALCIUM-MAGNESIUM-ZINC) TABS Take 1 tablet by mouth daily.     . nitroGLYCERIN (NITROSTAT) 0.4 MG SL tablet Place 1 tablet (0.4 mg total) under the tongue every 5 (five) minutes as needed. For chest pain 25 tablet 3  . pantoprazole (PROTONIX) 40 MG tablet Take 40 mg by mouth 2 (two) times daily.      . polyethylene glycol powder (GLYCOLAX/MIRALAX) powder Mix 17 grams in 8 oz of water twice daily 850 g 3  . pyridOXINE (VITAMIN B-6) 100 MG tablet Take 100 mg by mouth daily.    . ramipril (ALTACE) 2.5 MG capsule Take 1 capsule (2.5 mg total) by mouth daily. 90 capsule 3  . rOPINIRole (REQUIP) 1 MG tablet Take 1 mg by mouth at bedtime.      . rosuvastatin (CRESTOR) 40 MG tablet Take 40 mg by mouth daily.    . Simethicone Extra Strength 125 MG CAPS Take 1 tablet by mouth daily as needed (for bloating). Reported on 02/23/2016    . spironolactone (ALDACTONE) 25 MG tablet Take 1 tablet (25 mg total) by  mouth daily. 30 tablet 3  . SUMAtriptan (IMITREX) 100 MG tablet Take 1 tablet by mouth   daily as needed for migraine.     . tiotropium (SPIRIVA) 18 MCG inhalation capsule Place 18 mcg into inhaler and inhale daily.      . vitamin B-12 (CYANOCOBALAMIN) 1000 MCG tablet Take 1,000 mcg by mouth daily.    . vitamin C (ASCORBIC ACID) 500 MG tablet Take 500 mg by mouth daily.    . vitamin E 100 UNIT capsule Take 400 Units by mouth daily.    . Fe Cbn-Fe Gluc-FA-B12-C-DSS (FERRALET 90) 90-1 MG TABS Take 1 tablet by mouth daily. (Patient not taking: Reported on 07/03/2016) 30 each 0  . furosemide (LASIX) 20 MG tablet Take 3 tablets (60 mg total) by mouth 2 (two) times daily. If 1 to 2 pound weight gain in a day, take extra 20 mg dose 180 tablet 0  . Garlic 500 MG CAPS Take 500 mg by mouth daily.     . potassium chloride SA (K-DUR,KLOR-CON) 20 MEQ tablet Take 3 tablets (60 mEq total) by mouth 3 (three) times daily. With an additional 40 meq on Metolazone 270 tablet 0   No current facility-administered medications for this visit.     REVIEW OF SYSTEMS:   Constitutional: Denies fevers, chills or abnormal night sweats Eyes: Denies blurriness of vision, double vision or watery eyes Ears, nose, mouth, throat, and face: Denies mucositis or sore throat Respiratory: Shortness of breath to minimal exertion Cardiovascular: Palpitations Gastrointestinal:  Denies nausea, heartburn or change in bowel habits Skin: Denies abnormal skin rashes Lymphatics: Denies new lymphadenopathy or easy bruising Neurological:Denies numbness, tingling or new weaknesses Behavioral/Psych: Mood is stable, no new changes  Breast:  Denies any palpable lumps or discharge All other systems were reviewed with the patient and are negative.  PHYSICAL EXAMINATION: ECOG PERFORMANCE STATUS: 1 - Symptomatic but completely ambulatory  Vitals:   07/03/16 1301  BP: (!) 112/49  Pulse: 92  Resp: 18  Temp: 97.6 F (36.4 C)   Filed Weights    07/03/16 1301  Weight: 215 lb 9.6 oz (97.8 kg)    GENERAL:alert, no distress and comfortable SKIN: skin color, texture, turgor are normal, no rashes or significant lesions EYES: normal, conjunctiva are pink and non-injected, sclera clear OROPHARYNX:no exudate, no erythema and lips, buccal mucosa, and tongue normal  NECK: supple, thyroid normal size, non-tender, without nodularity LYMPH:  no palpable lymphadenopathy in the cervical, axillary or inguinal LUNGS: clear to auscultation and percussion with normal breathing effort HEART: regular rate & rhythm and no murmurs and no lower extremity edema ABDOMEN:abdomen soft, non-tender and normal bowel sounds Musculoskeletal:no cyanosis of digits and no clubbing  PSYCH: alert & oriented x 3 with fluent speech NEURO: no focal motor/sensory deficits  LABORATORY DATA:  I have reviewed the data as listed Lab Results  Component Value Date   WBC 10.7 (H) 07/03/2016   HGB 12.6 07/03/2016   HCT 38.6 07/03/2016   MCV 82.3 07/03/2016   PLT 344 07/03/2016   Lab Results  Component Value Date   NA 135 (L) 07/03/2016   K 3.8 07/03/2016   CL 94 (L) 05/27/2016   CO2 30 (H) 07/03/2016    RADIOGRAPHIC STUDIES: I have personally reviewed the radiological reports and agreed with the findings in the report.  ASSESSMENT AND PLAN:  Malignant neoplasm of lower-inner quadrant of left female breast ([facility]) 06/24/2016: Left breast bx 7:00: Malignant spindle cell carcinoma, ADH, pos for CK 8/18, CK 903, CK 5/6, CK AE1/AE3, neg for smooth muscle actin, CD34, desmin, calponin, E-cadherin, ER 0%, PR 0%,   Ki-67 20%, HER-2 neg ratio 1.21; 1.2 cm, T1 CN 0 stage IA  Pathology review: I discussed the patient the difference between spindle cell carcinoma and other breast cancers. Spindle cell carcinoma the breast makeup 0.5% a fall breast cancers. There is very little literature to discuss the best approach and treatment of these rare tumors. They're generally approach  and treated just like breast cancer. They tend to have lower rate of normal metastases but slightly higher rate of distant metastases. Although standard treatments include systemic chemotherapy, the role of chemotherapy has never been fully established or proven.  Recommendation: 1. Breast conserving surgery 2. followed by adjuvant radiation Patient at this time appears to be inclined towards doing a mastectomy by Dr. Dalbert Batman is trying to convince her that she does not need such an aggressive surgery especially because she has a pacemaker that would need to be moved if she were to require a mastectomy.  Patient has previously received 2 rounds of chemotherapy. She received FEC in 2005 and in 2008 TC for her recurrent breast cancer on the left side.   Patient has poor performance status which she attributes to her prior chemotherapy regimens. It is based upon these multitude of reasons, we decided that she should not receive chemotherapy.  Return to clinic after surgery to discuss the final pathology report   All questions were answered. The patient knows to call the clinic with any problems, questions or concerns.    Rulon Eisenmenger, MD 07/03/16

## 2016-07-03 NOTE — Progress Notes (Signed)
Nutrition Assessment  Reason for Assessment:  Pt seen in Breast Clinic  ASSESSMENT: 60 year old female with new diagnosis of left breast cancer. Past medical history of afib, DM, CHF, CAD, HTN, breast cancer times 2, anxiety.  Patient reports normal intake.  Medications:  reviewed  Labs: reviewed  Anthropometrics:   Height: 65 inches Weight: 215 lb BMI: 36   NUTRITION DIAGNOSIS: Food and nutrition related knowledge deficit related to new diagnosis of breast cancer as evidenced by no prior need for nutrition related information.  INTERVENTION:   Discussed and provided packet of information regarding nutritional tips for breast cancer patients.  Questions answered.  Teachback method used.      MONITORING, EVALUATION, and GOAL: Pt will consume a healthy plant based diet to maintain lean body mass throughout treatment.   [name] B. Zenia Resides, [place], [place] (pager)

## 2016-07-03 NOTE — Assessment & Plan Note (Signed)
06/24/2016: Left breast bx 7:00: Malignant spindle cell carcinoma, ADH, pos for CK 8/18, CK 903, CK 5/6, CK AE1/AE3, neg for smooth muscle actin, CD34, desmin, calponin, E-cadherin, ER 0%, PR 0%, Ki-67 20%, HER-2 neg ratio 1.21; 1.2 cm, T1 CN 0 stage IA  Pathology review: I discussed the patient the difference between spindle cell carcinoma and other breast cancers. Spindle cell carcinoma the breast makeup 0.5% a fall breast cancers. There is very little literature to discuss the best approach and treatment of these rare tumors. They're generally approach and treated just like breast cancer. They tend to have lower rate of normal metastases but slightly higher rate of distant metastases. Although standard treatments include systemic chemotherapy, the role of chemotherapy has never been fully established or proven.  Recommendation: 1. Breast conserving surgery 2. followed by adjuvant radiation Patient at this time appears to be inclined towards doing a mastectomy by Dr. Dalbert Batman is trying to convince her that she does not need such an aggressive surgery especially because she has a pacemaker that would need to be moved if she were to require a mastectomy.  Patient has previously received 2 rounds of chemotherapy. She received FEC in 2005 and in 2008 TC for her recurrent breast cancer on the left side.   Patient has poor performance status which she attributes to her prior chemotherapy regimens. It is based upon these multitude of reasons, we decided that she should not receive chemotherapy.  Return to clinic after surgery to discuss the final pathology report

## 2016-07-04 ENCOUNTER — Other Ambulatory Visit (HOSPITAL_COMMUNITY): Payer: Self-pay | Admitting: *Deleted

## 2016-07-04 DIAGNOSIS — I509 Heart failure, unspecified: Secondary | ICD-10-CM

## 2016-07-04 MED ORDER — POTASSIUM CHLORIDE CRYS ER 20 MEQ PO TBCR: 60.0000 meq | EXTENDED_RELEASE_TABLET | Freq: Three times a day (TID) | ORAL | 0 refills | Status: DC

## 2016-07-05 ENCOUNTER — Encounter (HOSPITAL_COMMUNITY): Payer: Self-pay | Admitting: Internal Medicine

## 2016-07-05 ENCOUNTER — Ambulatory Visit (HOSPITAL_COMMUNITY)
Admission: RE | Admit: 2016-07-05 | Discharge: 2016-07-05 | Disposition: A | Discharge status: Home / Self Care | Payer: Medicare Other | Source: Ambulatory Visit | Attending: Internal Medicine | Admitting: Internal Medicine

## 2016-07-05 ENCOUNTER — Ambulatory Visit (INDEPENDENT_AMBULATORY_CARE_PROVIDER_SITE_OTHER): Payer: Medicare Other | Admitting: *Deleted

## 2016-07-05 VITALS — BP 110/60 | HR 90 | Wt 218.5 lb

## 2016-07-05 DIAGNOSIS — G4733 Obstructive sleep apnea (adult) (pediatric): Secondary | ICD-10-CM | POA: Diagnosis not present

## 2016-07-05 DIAGNOSIS — N63 Unspecified lump in unspecified breast: Secondary | ICD-10-CM | POA: Diagnosis not present

## 2016-07-05 DIAGNOSIS — Z95810 Presence of automatic (implantable) cardiac defibrillator: Secondary | ICD-10-CM | POA: Insufficient documentation

## 2016-07-05 DIAGNOSIS — Z87891 Personal history of nicotine dependence: Secondary | ICD-10-CM | POA: Diagnosis not present

## 2016-07-05 DIAGNOSIS — F419 Anxiety disorder, unspecified: Secondary | ICD-10-CM | POA: Insufficient documentation

## 2016-07-05 DIAGNOSIS — Z01810 Encounter for preprocedural cardiovascular examination: Secondary | ICD-10-CM

## 2016-07-05 DIAGNOSIS — I428 Other cardiomyopathies: Secondary | ICD-10-CM | POA: Diagnosis not present

## 2016-07-05 DIAGNOSIS — Z7901 Long term (current) use of anticoagulants: Secondary | ICD-10-CM | POA: Insufficient documentation

## 2016-07-05 DIAGNOSIS — I472 Ventricular tachycardia: Secondary | ICD-10-CM | POA: Diagnosis not present

## 2016-07-05 DIAGNOSIS — I2510 Atherosclerotic heart disease of native coronary artery without angina pectoris: Secondary | ICD-10-CM | POA: Insufficient documentation

## 2016-07-05 DIAGNOSIS — E785 Hyperlipidemia, unspecified: Secondary | ICD-10-CM | POA: Insufficient documentation

## 2016-07-05 DIAGNOSIS — E669 Obesity, unspecified: Secondary | ICD-10-CM | POA: Diagnosis not present

## 2016-07-05 DIAGNOSIS — E1143 Type 2 diabetes mellitus with diabetic autonomic (poly)neuropathy: Secondary | ICD-10-CM | POA: Diagnosis not present

## 2016-07-05 DIAGNOSIS — I110 Hypertensive heart disease with heart failure: Secondary | ICD-10-CM | POA: Diagnosis not present

## 2016-07-05 DIAGNOSIS — Z79899 Other long term (current) drug therapy: Secondary | ICD-10-CM | POA: Diagnosis not present

## 2016-07-05 DIAGNOSIS — C50011 Malignant neoplasm of nipple and areola, right female breast: Secondary | ICD-10-CM

## 2016-07-05 DIAGNOSIS — I480 Paroxysmal atrial fibrillation: Secondary | ICD-10-CM | POA: Diagnosis not present

## 2016-07-05 DIAGNOSIS — I5022 Chronic systolic (congestive) heart failure: Secondary | ICD-10-CM | POA: Insufficient documentation

## 2016-07-05 NOTE — Patient Instructions (Signed)
Follow up 2 months with Dr. Haroldine Laws.  Do the following things EVERYDAY: 1) Weigh yourself in the morning before breakfast. Write it down and keep it in a log. 2) Take your medicines as prescribed 3) Eat low salt foods-Limit salt (sodium) to 2000 mg per day.  4) Stay as active as you can everyday 5) Limit all fluids for the day to less than 2 liters

## 2016-07-05 NOTE — Progress Notes (Signed)
Patient ID: [name], female   DOB: [birth year], 60 y.o.   MRN: [id number]    Advanced Heart Failure Clinic Note   Endocrinologist: Dr [name]  Oncologist: Dr. Humphrey [name] PCP: [name] ([place]) Primary HF MD: Dr [name]  HPI [name] is a 60 year old female with history of R breast cancer (2005, 2008), obesity, HTN, DM, IBS, HLD, OSA, chemo induced cardiomyopathy s/p ICD and chronic systolic HF.    She had chemo induced cardiomyopathy.  Her EF has fallen from 45% per Cardiac MRI in July 2010 to 20-25% in 10/2012. S/P single chamber ICD (St Jude) implantation on 11/10/12 by Dr. Rayann [name].    Admitted 08/19/15 with chest pain. Had [facility] with mild CAD and increased filling pressures. CPX placed on hold due to breast mass. She was told to have breast biopsy ASAP with her history of breast cancer x 2. Discharged on lasix 40 mg po twice daily, carvedilol 6.25 mg twice a day, ramipril 5 mg at bed time, dig 0.125 mg daily and ivabradine 5 mg twice a day. Discharge weight was 220 pounds.    Admitted 12/21/15 with increased dyspnea and chest pain. Additionally, a new breast lump was discovered, with outpatient follow up arranged. She diuresed 15 lbs. Discharge weight 213 lbs.  Admitted 7/17 with GIB. 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 Ardis [name]. Eliquis restarted without problem.   She returns today for unscheduled visit. 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 Planning lumpectomy and XRT with Dr. Dalbert [name]. From HF standpoint doing well. Able to do all ADLs without problem. Watching weight very closely. When weight goes up takes extra lasix and gets better.   Corevue - Thoracic impedence has been down over the past 2 weeks, but starting to trend towards dry. No AT/AF/VT/VF alert.   CPX 2017  Peak VO2: 12.7 (73% predicted peak VO2) VE/VCO2 slope: 32.3 OUES: 1.62 Peak RER: 1.08 Exercise testing with gas exchange demonstrates a mild to moderate  functional impairment when compared to matched sedentary norms. At peak exercise, patient appears to have mild circulatory limitation, Her body habitus and related restrictive lung physiology are also likely contributing to her exercise intolerance. Chronotropic incompetence was also present.   RHC/LCH  08/20/2014   Mid Cx lesion, 30% stenosed.  3rd Mrg lesion, 30% stenosed. Findings: RA = 12 RV = 55/6/12 PA = 54/21 (34) PCW = 22 Ao 93/58 (71) LV 106/8/26 Fick cardiac output/index = 4.2/2.1 PVR = 2.8 SVR = 1115 FA sat = 93% PA sat = 54%, 53% Assessment: 1. Very mild CAD 2. Mild to moderately increased filling pressures 3. Severe LV dysfunction due to NICM EF 20%medications.   [place] on 01/30/12 showed decompensated HF with low cardiac output consistent with cardiogenic shock.  [facility] 6/27  RA = 20  RV = 55/13/22  PA =56/34 (44)  PCW = 28  Fick cardiac output/index = 3.1/1.5  PVR = 5.3 [place]  FA sat = 91%  PA sat = 42%, 48%  SVC sat = 46%  Echo 08/12/11: Left ventricle: mildly dilated, EF 20% to 25%. Diffuse hypokinesis. Doppler parameters are consistent with restrictive physiology, indicative of decreased left ventricular diastolic compliance and/or increased left atrial pressure. Left atrium was moderately dilated. Right atrium mildly dilated. 04/09/12 ECHO EF 30-35% LV mildly dilated  Hyopkinesis 10/07/12 ECHO EF 20-25% RV ok. 03/31/14: ECHO EF 30-35%, severe HK inferior/inferoseptal walls, mild MR, LA mod dilated, RA mildly dilated  06/28/2015: ECHO EF 25% RV mildly dilated. Severe HK inferior wall.   Myoview in 05/2010 EF 59%. No ischemia.    ROS: All systems negative except as listed in HPI, PMH and Problem List.  Social History   Social History  . Marital status: Divorced    Spouse name: N/A  . Number of children: N/A  . Years of education: N/A   Occupational History  . Disabled Unemployed   Social History Main Topics  . Smoking status: Former Smoker    Quit  date: 04/12/2001  . Smokeless tobacco: Never Used  . Alcohol use 0.0 oz/week  . Drug use: No  . Sexual activity: Not Currently   Other Topics Concern  . Not on file   Social History Narrative   Lives with mother and brother [name]         Family History  Problem Relation Age of Onset  . Diabetes Mellitus II Mother   . Lung cancer Mother   . Congestive Heart Failure Mother   . Coronary artery disease Father     CABG x 3  . Hypertension Father   . Diabetes Mellitus II Father   . Breast cancer Cousin   . Breast cancer Cousin   . Uterine cancer Cousin   . Colon cancer Maternal Aunt   . Uterine cancer Maternal Aunt   . Lung cancer Maternal Aunt   . Uterine cancer Maternal Aunt   . Colon cancer Maternal Aunt     Past Medical History:  Diagnosis Date  . AICD (automatic cardioverter/defibrillator) present   . Anxiety   . 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 Asthma   . CHF (congestive heart failure) ([place])   . COPD (chronic obstructive pulmonary disease) ([place])   . Depression    followed by a psychiatrist  . DYSLIPIDEMIA 05/01/2009  . Gastroparesis   . GERD (gastroesophageal reflux disease)   . History of breast cancer 2006   with return in 2008, s/p mastectomy, XRT and chemotherapy  . Hypertension   . 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 IBS (irritable bowel syndrome)   . ICD (implantable cardiac defibrillator) in place 11/10/2012   ICD (11/2012)  . Malignant neoplasm of lower-inner quadrant of left female breast ([place]) 06/26/2016  . Myocardial infarction   . OBESITY 07/24/2007  . OBSTRUCTIVE SLEEP APNEA 07/24/2007   with CPAP compliance  . Osteoarthritis   . Persistent atrial fibrillation ([place]) 09/2014  . Presence of permanent cardiac pacemaker   . RESTLESS LEG SYNDROME 07/24/2007  . SYSTOLIC HEART FAILURE, CHRONIC 01/05/2009   a. chemo induced cardiomyopathy b. cMRI (02/2009) EF 45% c. Myoview 05/2010: EF 59%, no ischemia d. RHC (01/2012) RA 20, RV 55/13/22, PA 56/34 (44), PCWP 28,  Fick CO/CI: 3.1 / 1.5, PVR 5.3 WU, PA sat 42% & 48% e. ECHO (10/2012) EF 20-25%, diff HK, MV calcified annulus f. ECHO (03/2014) EF 30-35%, sev HK, inferior/inferoseptal walls, mild MR    Current Outpatient Prescriptions  Medication Sig Dispense Refill  . acetaminophen (TYLENOL) 650 MG CR tablet Take 1,300 mg by mouth every 8 (eight) hours as needed for pain.    Marland Kitchen albuterol (PROVENTIL HFA) 108 (90 BASE) MCG/ACT inhaler Inhale 2 puffs into the lungs every 6 (six) hours as needed. For shortness of breath    . ALPRAZolam (XANAX) 0.5 MG tablet Take 0.5 mg by mouth daily as needed for anxiety.     Marland Kitchen amiodarone (PACERONE) 200 MG tablet Take 1 tablet (200 mg total) by mouth daily. 30 tablet 6  .  amoxicillin (AMOXIL) 500 MG capsule Take 500 mg by mouth 2 (two) times daily. For 10 days    . apixaban (ELIQUIS) 5 MG TABS tablet Take 1 tablet (5 mg total) by mouth 2 (two) times daily. 180 tablet 3  . busPIRone (BUSPAR) 30 MG tablet Take 30 mg by mouth 2 (two) times daily.     . carvedilol (COREG) 6.25 MG tablet Take 1 tablet (6.25 mg total) by mouth 2 (two) times daily with a meal. 180 tablet 3  . digoxin (LANOXIN) 0.125 MG tablet Take 1 tablet (0.125 mg total) by mouth every other day. 135 tablet 3  . docusate sodium (COLACE) 100 MG capsule Take 3 capsules (300 mg total) by mouth at bedtime. 10 capsule 0  . DULoxetine (CYMBALTA) 30 MG capsule Take 30 mg by mouth daily.    Marland Kitchen Fe Cbn-Fe Gluc-FA-B12-C-DSS (FERRALET 90) 90-1 MG TABS Take 1 tablet by mouth daily. 30 each 0  . furosemide (LASIX) 20 MG tablet Take 3 tablets (60 mg total) by mouth 2 (two) times daily. If 1 to 2 pound weight gain in a day, take extra 20 mg dose 180 tablet 0  . Garlic [id number] MG CAPS Take 500 mg by mouth daily.     . Glucosamine 500 MG CAPS Take 500 mg by mouth daily.     Marland Kitchen HUMALOG KWIKPEN 100 UNIT/ML KiwkPen Inject 24 Units into the skin 3 (three) times daily. Sliding scale TID    . HUMALOG MIX 75/25 KWIKPEN (75-25) 100 UNIT/ML Kwikpen  Inject 75 Units into the skin 2 (two) times daily.    . hydrOXYzine (ATARAX/VISTARIL) 50 MG tablet Take 50 mg by mouth every 6 (six) hours as needed for itching.    . ivabradine (CORLANOR) 5 MG TABS tablet Take 1 tablet (5 mg total) by mouth 2 (two) times daily with a meal. 180 tablet 3  . levothyroxine (SYNTHROID, LEVOTHROID) 75 MCG tablet Take 75 mcg by mouth daily before breakfast.     . Melatonin 1 MG TABS Take 0.5 mg by mouth at bedtime.    . mometasone (NASONEX) 50 MCG/ACT nasal spray Place 1 spray into both nostrils daily. allergies    . Multiple Minerals (CALCIUM-MAGNESIUM-ZINC) TABS Take 1 tablet by mouth daily.     . pantoprazole (PROTONIX) 40 MG tablet Take 40 mg by mouth 2 (two) times daily.      . polyethylene glycol powder (GLYCOLAX/MIRALAX) powder Mix 17 grams in 8 oz of water twice daily 850 g 3  . potassium chloride SA (K-DUR,KLOR-CON) 20 MEQ tablet Take 3 tablets (60 mEq total) by mouth 3 (three) times daily. 270 tablet 0  . pyridOXINE (VITAMIN B-6) 100 MG tablet Take 100 mg by mouth daily.    . ramipril (ALTACE) 2.5 MG capsule Take 1 capsule (2.5 mg total) by mouth daily. 90 capsule 3  . rOPINIRole (REQUIP) 1 MG tablet Take 1 mg by mouth at bedtime.      . rosuvastatin (CRESTOR) 40 MG tablet Take 40 mg by mouth daily.    Marland Kitchen spironolactone (ALDACTONE) 25 MG tablet Take 1 tablet (25 mg total) by mouth daily. 30 tablet 3  . SUMAtriptan (IMITREX) 100 MG tablet Take 1 tablet by mouth daily as needed for migraine.     . tiotropium (SPIRIVA) 18 MCG inhalation capsule Place 18 mcg into inhaler and inhale daily.      . vitamin B-12 (CYANOCOBALAMIN) 1000 MCG tablet Take 1,000 mcg by mouth daily.    . vitamin E  100 UNIT capsule Take 400 Units by mouth daily.    . baclofen (LIORESAL) 20 MG tablet Take 20 mg by mouth 2 (two) times daily as needed for muscle spasms.    . metolazone (ZAROXOLYN) 2.5 MG tablet Take 2.5 mg by mouth as needed (for weight gain >3 lbs).    . nitroGLYCERIN  (NITROSTAT) 0.4 MG SL tablet Place 1 tablet (0.4 mg total) under the tongue every 5 (five) minutes as needed. For chest pain (Patient not taking: Reported on 07/05/2016) 25 tablet 3  . Simethicone Extra Strength 125 MG CAPS Take 1 tablet by mouth daily as needed (for bloating). Reported on 02/23/2016    . vitamin C (ASCORBIC ACID) 500 MG tablet Take 500 mg by mouth daily.     No current facility-administered medications for this encounter.    Vitals:   07/05/16 1449  BP: 110/60  Pulse: 90  SpO2: 98%  Weight: 218 lb 8 oz (99.1 kg)   Wt Readings from Last 3 Encounters:  07/05/16 218 lb 8 oz (99.1 kg)  07/03/16 215 lb 9.6 oz (97.8 kg)  05/27/16 202 lb (91.6 kg)     PHYSICAL EXAM: General:  Obese, Well appearing. Ambulated in the clinic without difficulty   HEENT: normal  Neck: supple. JVP 8-9 cm carotids 2+ bilaterally; no bruits. No thyromegaly or nodule noted.  Cor: PMI normal. Irregularly irregular. no S3. Lungs: CTAB, normal effort Abdomen: soft, obese, NT, ND, no HSM. No bruits or masses. +BS  Extremities: no cyanosis, clubbing, rash, Trace edema 1/2 way to calf.  Neuro: alert & orientedx3, cranial nerves grossly intact. Moves all 4 extremities w/o difficulty. Affect pleasant.  ICD interrogated personally. Volume status mildly elevated. No VtT  ASSESSMENT & PLAN:  1) Chronic systolic HF, NICM thought to be chemo induced, s/p ST Jude  ICD. Most Recent ECHO 06/28/2015  EF ~20%. RV mildly dilated. Echo 1/17 30-35% - Has NYHA II-III symptoms.   - Continue lasix 60 mg BID. Continue spiro 25 mg daily. - Continue metolazone 2.5 mg as needed ( should take 40 meq of potassium on these days).  - Continue coreg 6.25 mg BID  - Continue ramipril 2.5 mg qhs. (unable to tolerate increased dose so likely would not tolerate Entresto)  -Continue digoxin 0.125 mg daily . Dig level 0.2 on 08/2015  - Continue corlanor 5 mg twice a day.    - CPX result with mild -mod circulatory limitation in 1/16     - Reinforced the need and importance of daily weights, a low sodium diet, and fluid restriction (less than 2 L a day). Instructed to call the HF clinic if weight increases more than 3 lbs overnight or 5 lbs in a week.  2) HTN - Blood pressure well controlled. Continue current regimen. 3) OSA - Compliant with CPAP nightly. 4) HLD - Followed by PCP.  5) Obesity - Continues to work on weight loss 6) CAD - mild nonobstructive CAD on 2017 cath. Will continue statin, BB and ACE-I. No s/s of ischemia. 7) NSVT- Continue  amio to 200 mg daily. Needs yearly eye exam.  TSH January 2017 ok.  8) Breast Mass - At this point I think she is low to moderate risk for peri-operative cardiac complications and can proceed without further testing. Would avoid general anesthesia if at all possible to keep risk at a minimum 9) Paroxysmal Afib - in NSR by exam. No AF on corevue.  - Continue Eliquis 5 mg BID - OK  to hold Eliquis for 2 days prior to surgery and 1 day afterwards, if needed   BMET/BNP today. Follow up 2 months  Tyresa Prindiville MD 3:04 PM

## 2016-07-09 ENCOUNTER — Encounter: Payer: Self-pay | Admitting: *Deleted

## 2016-07-09 ENCOUNTER — Other Ambulatory Visit: Payer: Self-pay | Admitting: General Surgery

## 2016-07-09 ENCOUNTER — Telehealth: Payer: Self-pay | Admitting: *Deleted

## 2016-07-09 DIAGNOSIS — C50311 Malignant neoplasm of lower-inner quadrant of right female breast: Secondary | ICD-10-CM

## 2016-07-09 DIAGNOSIS — Z171 Estrogen receptor negative status [ER-]: Principal | ICD-10-CM

## 2016-07-09 NOTE — Telephone Encounter (Signed)
Left vm regarding [facility] from 07/03/16. Contact information provided. 

## 2016-07-11 ENCOUNTER — Other Ambulatory Visit (HOSPITAL_COMMUNITY): Payer: Self-pay | Admitting: *Deleted

## 2016-07-11 DIAGNOSIS — I5023 Acute on chronic systolic (congestive) heart failure: Secondary | ICD-10-CM

## 2016-07-11 DIAGNOSIS — I5022 Chronic systolic (congestive) heart failure: Secondary | ICD-10-CM

## 2016-07-11 DIAGNOSIS — I509 Heart failure, unspecified: Secondary | ICD-10-CM

## 2016-07-11 MED ORDER — APIXABAN 5 MG PO TABS: 5.0000 mg | ORAL_TABLET | Freq: Two times a day (BID) | ORAL | 3 refills | Status: DC

## 2016-07-11 MED ORDER — CARVEDILOL 6.25 MG PO TABS: 6.2500 mg | ORAL_TABLET | Freq: Two times a day (BID) | ORAL | 3 refills | Status: DC

## 2016-07-11 MED ORDER — IVABRADINE HCL 5 MG PO TABS
5.0000 mg | ORAL_TABLET | Freq: Two times a day (BID) | ORAL | 3 refills | Status: DC
Start: 2016-07-11 — End: 2017-01-20

## 2016-07-11 MED ORDER — POTASSIUM CHLORIDE CRYS ER 20 MEQ PO TBCR: 60.0000 meq | EXTENDED_RELEASE_TABLET | Freq: Three times a day (TID) | ORAL | 3 refills | Status: DC

## 2016-07-11 MED ORDER — FUROSEMIDE 20 MG PO TABS: 60.0000 mg | ORAL_TABLET | Freq: Two times a day (BID) | ORAL | 0 refills | Status: DC

## 2016-07-11 MED ORDER — AMIODARONE HCL 200 MG PO TABS: 200.0000 mg | ORAL_TABLET | Freq: Every day | ORAL | 3 refills | Status: DC

## 2016-07-11 MED ORDER — SPIRONOLACTONE 25 MG PO TABS: 25.0000 mg | ORAL_TABLET | Freq: Every day | ORAL | 3 refills | Status: DC

## 2016-07-11 MED ORDER — DIGOXIN 125 MCG PO TABS: 0.1250 mg | ORAL_TABLET | ORAL | 3 refills | Status: DC

## 2016-07-11 MED ORDER — RAMIPRIL 2.5 MG PO CAPS: 2.5000 mg | ORAL_CAPSULE | Freq: Every day | ORAL | 3 refills | Status: DC

## 2016-07-11 MED ORDER — METOLAZONE 2.5 MG PO TABS: 2.5000 mg | ORAL_TABLET | ORAL | 3 refills | Status: DC | PRN

## 2016-07-12 NOTE — Progress Notes (Signed)
Remote ICD transmission.   

## 2016-07-15 ENCOUNTER — Other Ambulatory Visit: Payer: Self-pay | Admitting: Internal Medicine

## 2016-07-16 ENCOUNTER — Encounter: Payer: Self-pay | Admitting: Cardiology

## 2016-07-19 ENCOUNTER — Encounter (HOSPITAL_COMMUNITY)
Admission: RE | Admit: 2016-07-19 | Discharge: 2016-07-19 | Disposition: A | Discharge status: Home / Self Care | Payer: Medicare Other | Source: Ambulatory Visit | Attending: General Surgery | Admitting: General Surgery

## 2016-07-19 ENCOUNTER — Encounter (HOSPITAL_COMMUNITY): Payer: Self-pay

## 2016-07-19 ENCOUNTER — Other Ambulatory Visit: Payer: Self-pay

## 2016-07-19 DIAGNOSIS — I4891 Unspecified atrial fibrillation: Secondary | ICD-10-CM | POA: Diagnosis not present

## 2016-07-19 DIAGNOSIS — Z01818 Encounter for other preprocedural examination: Secondary | ICD-10-CM | POA: Diagnosis not present

## 2016-07-19 DIAGNOSIS — Z7901 Long term (current) use of anticoagulants: Secondary | ICD-10-CM | POA: Insufficient documentation

## 2016-07-19 DIAGNOSIS — Z79899 Other long term (current) drug therapy: Secondary | ICD-10-CM | POA: Diagnosis not present

## 2016-07-19 DIAGNOSIS — J449 Chronic obstructive pulmonary disease, unspecified: Secondary | ICD-10-CM | POA: Diagnosis not present

## 2016-07-19 DIAGNOSIS — E039 Hypothyroidism, unspecified: Secondary | ICD-10-CM | POA: Diagnosis not present

## 2016-07-19 DIAGNOSIS — I110 Hypertensive heart disease with heart failure: Secondary | ICD-10-CM | POA: Insufficient documentation

## 2016-07-19 DIAGNOSIS — Z87891 Personal history of nicotine dependence: Secondary | ICD-10-CM | POA: Diagnosis not present

## 2016-07-19 DIAGNOSIS — E119 Type 2 diabetes mellitus without complications: Secondary | ICD-10-CM | POA: Diagnosis not present

## 2016-07-19 DIAGNOSIS — Z794 Long term (current) use of insulin: Secondary | ICD-10-CM | POA: Insufficient documentation

## 2016-07-19 DIAGNOSIS — K219 Gastro-esophageal reflux disease without esophagitis: Secondary | ICD-10-CM | POA: Diagnosis not present

## 2016-07-19 DIAGNOSIS — Z95810 Presence of automatic (implantable) cardiac defibrillator: Secondary | ICD-10-CM | POA: Diagnosis not present

## 2016-07-19 DIAGNOSIS — G4733 Obstructive sleep apnea (adult) (pediatric): Secondary | ICD-10-CM | POA: Diagnosis not present

## 2016-07-19 DIAGNOSIS — C50912 Malignant neoplasm of unspecified site of left female breast: Secondary | ICD-10-CM | POA: Insufficient documentation

## 2016-07-19 DIAGNOSIS — Z01812 Encounter for preprocedural laboratory examination: Secondary | ICD-10-CM | POA: Insufficient documentation

## 2016-07-19 DIAGNOSIS — I5022 Chronic systolic (congestive) heart failure: Secondary | ICD-10-CM | POA: Insufficient documentation

## 2016-07-19 DIAGNOSIS — I2510 Atherosclerotic heart disease of native coronary artery without angina pectoris: Secondary | ICD-10-CM | POA: Diagnosis not present

## 2016-07-19 HISTORY — DX: Headache: R51

## 2016-07-19 HISTORY — DX: Headache, unspecified: R51.9

## 2016-07-19 LAB — CBC
HCT: 37.1 % (ref 36.0–46.0)
HEMOGLOBIN: 11.9 g/dL — AB (ref 12.0–15.0)
MCH: 26.9 pg (ref 26.0–34.0)
MCHC: 32.1 g/dL (ref 30.0–36.0)
MCV: 83.7 fL (ref 78.0–100.0)
PLATELETS: 312 10*3/uL (ref 150–400)
RBC: 4.43 MIL/uL (ref 3.87–5.11)
RDW: 17.5 % — ABNORMAL HIGH (ref 11.5–15.5)
WBC: 9.5 10*3/uL (ref 4.0–10.5)

## 2016-07-19 LAB — BASIC METABOLIC PANEL
ANION GAP: 10 (ref 5–15)
BUN: 17 mg/dL (ref 6–20)
CHLORIDE: 92 mmol/L — AB (ref 101–111)
CO2: 31 mmol/L (ref 22–32)
Calcium: 9.5 mg/dL (ref 8.9–10.3)
Creatinine, Ser: 1.03 mg/dL — ABNORMAL HIGH (ref 0.44–1.00)
GFR, EST NON AFRICAN AMERICAN: 57 mL/min — AB (ref >60)
Glucose, Bld: 214 mg/dL — ABNORMAL HIGH (ref 65–99)
POTASSIUM: 3 mmol/L — AB (ref 3.5–5.1)
SODIUM: 133 mmol/L — AB (ref 135–145)

## 2016-07-19 LAB — GLUCOSE, CAPILLARY: GLUCOSE-CAPILLARY: 232 mg/dL — AB (ref 65–99)

## 2016-07-19 NOTE — Progress Notes (Signed)
Anesthesia consult:  Pt is a 60 year old female scheduled for L breast lumpectomy with radioactive seed and sentinel lymph node biopsy with blue dye injection on 07/24/2016 with Fanny Skates, MD.   - HF cardiologist is Glori Bickers, MD, who cleared pt for surgery at last office visit 07/05/16.  - EP cardiologist Thompson Grayer, MD - PCP is [name], NP  PMH includes:  Chronic systolic HF, non-ischemic cardiomyopathy (induced by chemo), AICD (St. Jude), atrial fibrillation, NSVT, HTN, COPD, asthma, OSA, DM, hypothyroidism, breast cancer, GERD. Former smoker. BMI 36  Medications: albuterol, amiodarone, eliquis, carvedilol, digoxin, iron, lasix, humalog, humalog mix 70/30, ivabradine, levothyroxine, metolazone, protonix, potassium, ramipril, crestor, spironolactone, spiriva. Pt to stop eliquis 2 days before surgery. She reports she is currently out of her digoxin and spironolactone but is working on getting them filled.   Preoperative labs reviewed.  HgbA1c pending.   CXR 03/16/16: Mild CHF superimposed upon a background of COPD changes. Aortic atherosclerosis.  EKG 07/19/16: Sinus rhythm with 1st degree A-V block. LAD. Non-specific intra-ventricular conduction block. Cannot rule out Septal infarct, age undetermined. Inferior infarct, age undetermined. Appears stable when compared to EKGs dating back to prior to cardiac cath 08/2015.   Cardiopulmonary Exercise Test 09/11/15: Exercise testing with gas exchange demonstrates a mild to moderate functional impairment when compared to matched sedentary norms. At peak exercise, patient appears to have mild circulatory limitation, Her body habitus and related restrictive lung physiology are also likely contributing to her exercise intolerance. Chronotropic incompetence was also present.   R and L cardiac cath 08/21/15:   Mid Cx lesion, 30% stenosed.  3rd Mrg lesion, 30% stenosed.   Findings:  RA = 12 RV = 55/6/12 PA = 54/21 (34) PCW = 22 Ao  93/58 (71) LV 106/8/26 Fick cardiac output/index = 4.2/2.1 PVR = 2.8 SVR = 1115 FA sat = 93% PA sat = 54%, 53%  Assessment: 1. Very mild CAD 2. Mild to moderately increased filling pressures 3. Severe LV dysfunction due to NICM EF 20%  Echo 08/21/15:  - Left ventricle: The cavity size was moderately dilated. Wall thickness was normal. Systolic function was moderately to severely reduced. The estimated ejection fraction was in the range of 30% to 35%. Diffuse hypokinesis. Doppler parameters are consistent with restrictive physiology, indicative of decreased left entricular diastolic compliance and/or increased left atrial pressure. Doppler parameters are consistent with high ventricular filling pressure. - Mitral valve: Calcified annulus. Mildly thickened leaflets. There was mild regurgitation.  Called to see pt at PAT for chest pain. Pt was grocery shopping 07/17/16 when she developed L chest pain that radiated to L arm and was associated with "rough" breathing. She took 1 nitro and sx completely resolved. Episode lasted less than 10 minutes. Next pt was awakened 07/18/16 at 5am with exact same sx. Took nitro right away and sx resolved.  Today (07/19/16) while driving to this PAT appt, she experienced same symptoms but they were milder. She did not take nitro and sx resolved spontaneously.   I spoke with Dr. Haroldine Laws about pt's sx.  He reported that she frequently has these complaints and stated he is not concerned about her sx given her cath 08/2015 that showed mild non-obstructive CAD. He felt if her EKG was stable (which it appears to be), it is unlikely her sx are ACS and he felt she could proceed with surgery as scheduled.   I reviewed with pt reasons for calling EMS for emergent assistance. I also asked pt to  contact Dr. Clayborne Dana office Monday morning (07/22/16) if her sx continue to occur episodically. Pt agreed.   Pt will need further assessment by assigned anesthesiologist DOS.    Willeen Cass, FNP-BC [facility]/Anesthesiology Phone: [phone] 07/19/2016 4:59 PM

## 2016-07-19 NOTE — Progress Notes (Signed)
PCP Sonia Baller, NP Cardiologist is Dr. Haroldine Laws. Patient stated she was having dull chest pain @ a 4 radiating down her left arm x2 days. Took Nitro on Wednesday with relief. Denied any shortness of breath. Stated she had been "stressed" for the past few days and without Digoxin for 2 days due to mail order pharmacy not sending meds, and attributed her chest pain to that. Stress test in Feb 2017, cardiac cath Jan 2017; was unsure about echo. Patient does have a pacer and ICD managed by Dr. Rayann Heman. Faxed preop prescription for implanted device to [facility].  Charlesetta Ivory, FNP to assess patient.  Fasting blood sugar runs in the 160s States she was told to stop Eloquis two days prior to surgery.

## 2016-07-19 NOTE — Pre-Procedure Instructions (Addendum)
ABIGAYLE POTTS  07/19/2016      [facility] - [place], [place] [address] Phone: [phone] Fax: [facility] [address] [place] [address] [place] [address] Phone: [phone] Fax: [phone]    Your procedure is scheduled on July 24, 2016.  Report to [facility] Admitting at 1030 A.M.  Call this number if you have problems the morning of surgery:  [phone]   Remember:  Do not eat food or drink liquids after midnight.  Take these medicines the morning of surgery with A SIP OF WATER  Amiodarone, Baclofen, Buspirone, Carvedilol (Coreg), Digoxin, Cymbalta, Ivabradine, Levothyroxine, Nasonex, Protonix, Eye drops,  Spiriva.   If needed Tylenol, Albuterol inhaler, Xanax, Hydroxyzine, , Simethicone, Imitrex, Nitroglycerin  Bring your inhalers with you on the day of surgery.   7 days prior to surgery STOP taking any Aspirin, Aleve, Naproxen, Ibuprofen, Motrin, Advil, Goody's, BC's, all herbal medications, fish oil, and all vitamins, Melatonin -stop Eliquis as directed by your Dr.    Francene Finders to Manage Your Diabetes Before and After Surgery  Why is it important to control my blood sugar before and after surgery? . Improving blood sugar levels before and after surgery helps healing and can limit problems. . A way of improving blood sugar control is eating a healthy diet by: o  Eating less sugar and carbohydrates o  Increasing activity/exercise o  Talking with your doctor about reaching your blood sugar goals . High blood sugars (greater than 180 mg/dL) can raise your risk of infections and slow your recovery, so you will need to focus on controlling your diabetes during the weeks before surgery. . Make sure that the doctor who takes care of your diabetes knows about your planned surgery including the date and location.  How do I manage my  blood sugar before surgery? . Check your blood sugar at least 4 times a day, starting 2 days before surgery, to make sure that the level is not too high or low. o Check your blood sugar the morning of your surgery when you wake up and every 2 hours until you get to the Short Stay unit. . If your blood sugar is less than 70 mg/dL, you will need to treat for low blood sugar: o Do not take insulin. o Treat a low blood sugar (less than 70 mg/dL) with  cup of clear juice (cranberry or apple), 4 glucose tablets, OR glucose gel. o Recheck blood sugar in 15 minutes after treatment (to make sure it is greater than 70 mg/dL). If your blood sugar is not greater than 70 mg/dL on recheck, call [phone] for further instructions. . Report your blood sugar to the short stay nurse when you get to Short Stay.  . If you are admitted to the hospital after surgery: o Your blood sugar will be checked by the staff and you will probably be given insulin after surgery (instead of oral diabetes medicines) to make sure you have good blood sugar levels. o The goal for blood sugar control after surgery is 80-180 mg/dL.          WHAT DO I DO ABOUT MY DIABETES MEDICATION?   Marland Kitchen Do not take oral diabetes medicines (pills) the morning of surgery.  . THE NIGHT BEFORE SURGERY, take ____52_______ units of _____Humalog 75/25______insulin.      . The day of  surgery, do not take other diabetes injectables, including Byetta (exenatide), Bydureon (exenatide ER), Victoza (liraglutide), or Trulicity (dulaglutide).  . If your CBG is greater than 220 mg/dL, you may take  of your sliding scale (correction) dose of insulin.  Other Instructions:          Do not wear jewelry, make-up or nail polish.  Do not wear lotions, powders, or perfumes, or deodorant.  Do not shave 48 hours prior to surgery.   Do not bring valuables to the hospital.  [facility] is not responsible for any belongings or valuables.  Contacts,  dentures or bridgework may not be worn into surgery.  Leave your suitcase in the car.  After surgery it may be brought to your room.  For patients admitted to the hospital, discharge time will be determined by your treatment team.  Patients discharged the day of surgery will not be allowed to drive home.   Name and phone number of your driver:    Special instructions:   [place]- Preparing For Surgery  Before surgery, you can play an important role. Because skin is not sterile, your skin needs to be as free of germs as possible. You can reduce the number of germs on your skin by washing with CHG (chlorahexidine gluconate) Soap before surgery.  CHG is an antiseptic cleaner which kills germs and bonds with the skin to continue killing germs even after washing.  Please do not use if you have an allergy to CHG or antibacterial soaps. If your skin becomes reddened/irritated stop using the CHG.  Do not shave (including legs and underarms) for at least 48 hours prior to first CHG shower. It is OK to shave your face.  Please follow these instructions carefully.   1. Shower the NIGHT BEFORE SURGERY and the MORNING OF SURGERY with CHG.   2. If you chose to wash your hair, wash your hair first as usual with your normal shampoo.  3. After you shampoo, rinse your hair and body thoroughly to remove the shampoo.  4. Use CHG as you would any other liquid soap. You can apply CHG directly to the skin and wash gently with a scrungie or a clean washcloth.   5. Apply the CHG Soap to your body ONLY FROM THE NECK DOWN.  Do not use on open wounds or open sores. Avoid contact with your eyes, ears, mouth and genitals (private parts). Wash genitals (private parts) with your normal soap.  6. Wash thoroughly, paying special attention to the area where your surgery will be performed.  7. Thoroughly rinse your body with warm water from the neck down.  8. DO NOT shower/wash with your normal soap after using and  rinsing off the CHG Soap.  9. Pat yourself dry with a CLEAN TOWEL.   10. Wear CLEAN PAJAMAS   11. Place CLEAN SHEETS on your bed the night of your first shower and DO NOT SLEEP WITH PETS.    Day of Surgery: Do not apply any deodorants/lotions. Please wear clean clothes to the hospital/surgery center.      Please read over the following fact sheets that you were given. Pain Booklet, Coughing and Deep Breathing, MRSA Information and Surgical Site Infection Prevention

## 2016-07-20 LAB — HEMOGLOBIN A1C
HEMOGLOBIN A1C: 8.1 % — AB (ref 4.8–5.6)
Mean Plasma Glucose: 186 mg/dL

## 2016-07-22 ENCOUNTER — Encounter (HOSPITAL_COMMUNITY): Payer: Self-pay | Admitting: *Deleted

## 2016-07-22 DIAGNOSIS — C50912 Malignant neoplasm of unspecified site of left female breast: Secondary | ICD-10-CM | POA: Diagnosis not present

## 2016-07-22 DIAGNOSIS — N6324 Unspecified lump in the left breast, lower inner quadrant: Secondary | ICD-10-CM | POA: Diagnosis not present

## 2016-07-22 NOTE — H&P (Signed)
[name] Location: [facility] Patient #: [id number] DOB: May 20, 1956 Undefined / Language: Cleophus Molt / Race: White Female        History of Present Illness The patient is a 60 year old female who presents with breast cancer. This is a 60 year old Caucasian female with a significant number of comorbidities. She is referred by Dr. Emmit Pomfret at [facility] mammography for evaluation and management of a newly diagnosed breast cancer of the left breast, lower inner quadrant. Her PCP is Dr. Nelda Bucks in [place]. Her cardiologist is Dr. Sung Amabile. She is seen in the [facility] today by Dr. Lindi Adie, Dr. Sondra Come and me.  She has a significant past history of right breast cancer. She underwent right breast lumpectomy by Dr. Deon Pilling in 2005 and had adjuvant chemotherapy. She developed a recurrence in 2008 and had a mastectomy on the right and she states adjuvant chemotherapy again. Recent screening imaging studies show a 1.2 cm mass left breast, 7:00 position, 5 cm from the nipple. 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 We have discussed the pathology in conference this morning. This is known to be a potentially aggressive tumor in terms of systemic disease. Nodal metastasis are variable.  Comorbidities revealed that she has atrial fibrillation on eliquis. Has AICD and pacemaker in left infraclavicular area. History right breast cancer as above. Significant obesity. Obstructive sleep apnea. Asthma. COPD. Depression followed by psychiatrist. Hypertension. Congestive heart failure. History laparoscopic cholecystectomy. Hospitalized this year for GI bleeding which was thought to be due to an ulcer in the small bowel which was cauterized. She also has a history of hidradenitis, axilla and groins This obviously complicates wound infection rates, and I discussed this with her.  Family history  reveals 2 cousins and a maternal grandmother had breast cancer. There is uterine cancer in some family members. She is divorced. Lives in [place]. Quit smoking 2002. Lives alone. Has one child.  We had a long discussion about the options for surgical intervention, including mastectomy with or without reconstruction, lumpectomy, and sentinel lymph node biopsy. Her initial thought was to have a left mastectomy and bilateral reconstruction. I told her that I would be willing to refer her to a plastic surgeon for conversation about that, but I'm somewhat discouraged that due to her hip dread nidus and her significant comorbidities and her AICD. I told her that I thought that her wound infection rates and overall competitions would be higher with extensive surgery and I told her that my bias was to stick with more conservative approach which would include lumpectomy, sentinel lymph node biopsy, and adjuvant whole breast radiation therapy. High risk for infection due to hidradenitis, obesity, diabetes, and pulmonary disease. She is going to think about all of this and let me know.  She is referred immediately to Dr. Sung Amabile to get his opinion about cardiac risk factors, risk assessment, cardiac clearance, and management of the AICD ibn the different settings including lumpectomy, mastectomy, and mastectomy with reconstruction. She will return to see me in 2 weeks for further discussion If she decides to go ahead with lumpectomy and sentinel node I told her to call us and we will go ahead and schedule surgery once we get cardiac clearance.  A high school friend of hers was with her throughout the encounter. This is Lenore who used to work in my office.   Addendum Note Cardiac clearance complete - Dr. Sung Amabile hold eliquis for 2 days pre  op and 1 day post op. [employer] to deprogram and reprogram AICD peri-op Orders in Epic Posted   Other Problems Arthritis  Asthma  Atrial  Fibrillation  Back Pain  Breast Cancer  Cholelithiasis  Chronic Obstructive Lung Disease  Congestive Heart Failure  Depression  Diabetes Mellitus  Gastric Ulcer  Gastroesophageal Reflux Disease  High blood pressure  Home Oxygen Use  Hypercholesterolemia  Lump In Breast  Myocardial infarction  Sleep Apnea  Thyroid Disease  Ventral Hernia Repair   Past Surgical History Breast Biopsy  Bilateral. multiple Cataract Surgery  Bilateral. Colon Polyp Removal - Colonoscopy  Gallbladder Surgery - Open  Hysterectomy (not due to cancer) - Partial  Knee Surgery  Left. Mastectomy  Right. Oral Surgery   Diagnostic Studies History  Colonoscopy  within last year Mammogram  within last year Pap Smear  >5 years ago  Medication History  Medications Reconciled  Social History  No alcohol use  No caffeine use  No drug use  Tobacco use  Former smoker.  Family History  Arthritis  Family Members In General, Father, Mother. Breast Cancer  Family Members In General. Cancer  Family Members In General, Mother. Diabetes Mellitus  Father, Mother. Heart Disease  Father. Malignant Neoplasm Of Pancreas  Family Members In General. Ovarian Cancer  Family Members In General. Respiratory Condition  Family Members In General, Mother.  Pregnancy / Birth History  Age at menarche  42 years. Contraceptive History  Oral contraceptives. Gravida  2 Maternal age  79-30 Para  1    Review of Systems  General Present- Chills, Fatigue and Fever. Not Present- Appetite Loss, Night Sweats, Weight Gain and Weight Loss. Skin Present- Dryness, Rash and Ulcer. Not Present- Change in Wart/Mole, Hives, Jaundice, New Lesions and Non-Healing Wounds. HEENT Present- Seasonal Allergies and Wears glasses/contact lenses. Not Present- Earache, Hearing Loss, Hoarseness, Nose Bleed, Oral Ulcers, Ringing in the Ears, Sinus Pain, Sore Throat, Visual Disturbances and Yellow  Eyes. Respiratory Present- Snoring and Wheezing. Not Present- Bloody sputum, Chronic Cough and Difficulty Breathing. Breast Present- Breast Pain. Not Present- Breast Mass, Nipple Discharge and Skin Changes. Cardiovascular Present- Difficulty Breathing Lying Down. Not Present- Chest Pain, Leg Cramps, Palpitations, Rapid Heart Rate, Shortness of Breath and Swelling of Extremities. Gastrointestinal Present- Constipation. Not Present- Abdominal Pain, Bloating, Bloody Stool, Change in Bowel Habits, Chronic diarrhea, Difficulty Swallowing, Excessive gas, Gets full quickly at meals, Hemorrhoids, Indigestion, Nausea, Rectal Pain and Vomiting. Female Genitourinary Not Present- Frequency, Nocturia, Painful Urination, Pelvic Pain and Urgency. Musculoskeletal Present- Joint Stiffness. Not Present- Back Pain, Joint Pain, Muscle Pain, Muscle Weakness and Swelling of Extremities. Neurological Present- Numbness and Tingling. Not Present- Decreased Memory, Fainting, Headaches, Seizures, Tremor, Trouble walking and Weakness. Psychiatric Present- Depression. Not Present- Anxiety, Bipolar, Change in Sleep Pattern, Fearful and Frequent crying. Endocrine Not Present- Cold Intolerance, Excessive Hunger, Hair Changes, Heat Intolerance, Hot flashes and New Diabetes. Hematology Present- Blood Thinners, Easy Bruising and Excessive bleeding. Not Present- Gland problems, HIV and Persistent Infections.  Vitals Addendum Note BP 112/49 (BP Location: Left Arm, Patient Position: Sitting) Pulse 92 Temp 97.6 F (36.4 C) (Oral) Resp 18 Ht '5\' 5"'$  (1.651 m) Wt 215 lb 9.6 oz (97.8 kg) SpO2 95% BMI 35.88 kg/m BSA 2.12 m    Physical Exam General Mental Status-Alert. General Appearance-Consistent with stated age. Hydration-Well hydrated. Voice-Normal. Note: Significant obesity   Head and Neck Head-normocephalic, atraumatic with no lesions or palpable masses. Trachea-midline. Thyroid Gland  Characteristics - normal size and consistency.  Eye Eyeball -  Bilateral-Extraocular movements intact. Sclera/Conjunctiva - Bilateral-No scleral icterus.  Chest and Lung Exam Chest and lung exam reveals -quiet, even and easy respiratory effort with no use of accessory muscles and on auscultation, normal breath sounds, no adventitious sounds and normal vocal resonance. Inspection Chest Wall - Normal. Back - normal.  Breast Note: Right mastectomy well healed. No nodules or ulcerations or adenopathy on the right. A little bit of right arm lymphedema noted. No signs of recurrence. On the left side there is an old transverse scar in the upper inner quadrant. Large palpable AICD left infraclavicular area that extends well down into the breast tissue. A little bit of ecchymoses in the inferior breast but no dominant mass. No palpable adenopathy on the left side.   Cardiovascular Cardiovascular examination reveals -normal heart sounds, regular rate and rhythm with no murmurs and normal pedal pulses bilaterally.  Abdomen Inspection Inspection of the abdomen reveals - No Hernias. Skin - Scar - Note: Laparoscopic scars from cholecystectomy. No obvious hernia on supine exam with sit up. Palpation/Percussion Palpation and Percussion of the abdomen reveal - Soft, Non Tender, No Rebound tenderness, No Rigidity (guarding) and No hepatosplenomegaly. Auscultation Auscultation of the abdomen reveals - Bowel sounds normal.  Neurologic Neurologic evaluation reveals -alert and oriented x 3 with no impairment of recent or remote memory. Mental Status-Normal.  Musculoskeletal Normal Exam - Left-Upper Extremity Strength Normal and Lower Extremity Strength Normal. Normal Exam - Right-Upper Extremity Strength Normal and Lower Extremity Strength Normal.  Lymphatic Head & Neck  General Head & Neck Lymphatics: Bilateral - Description - Normal. Axillary  General Axillary Region: Bilateral -  Description - Normal. Tenderness - Non Tender. Femoral & Inguinal  Generalized Femoral & Inguinal Lymphatics: Bilateral - Description - Normal. Tenderness - Non Tender.    Assessment & Plan  PRIMARY CANCER OF LOWER-INNER QUADRANT OF LEFT FEMALE BREAST (C50.312) .  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  We have discussed the options for surgical intervention including mastectomy with or without reconstruction, lumpectomy and sentinel lymph node biopsy. You have stated that you're interested in left mastectomy and bilateral reconstruction I have told you that we will consider this and refer you to plastic surgery if she desired. I have told you that my bias is to proceed with left breast lumpectomy and left axillary sentinel lymph node biopsy because of your numerous medical problems and the high risk nature of mastectomy and bilateral reconstruction. At this point we have not made a decision about your surgery. You will be immediately referred to your cardiologist, Dr. Sung Amabile, for cardiac risk assessment and his opinion about management of the AICD and pacemaker with various circumstances occurring lumpectomy, mastectomy, and whole breast radiation therapy  Dr. Lindi Adie has told you that you are not a candidate for chemotherapy  You will be referred to genetic counseling  Return to see Dr. Dalbert Batman in 2 weeks for further discussion. If you decide to proceed with left breast lumpectomy and left axillary sentinel node biopsy, give [place] a call andwe will proceed with scheduling of that procedure once we receive cardiac clearance and  risk assessment from Dr. Sung Amabile  .  HISTORY OF RIGHT BREAST CANCER (  Z85.3) Impression: Lumpectomy right breast 2005 by Dr. Deon Pilling. Adjuvant chemotherapy History right mastectomy 2008 for recurrence. Adjuvant chemotherapy HISTORY OF MYOCARDIAL INFARCTION (I25.2) AICD (AUTOMATIC CARDIOVERTER/DEFIBRILLATOR) PRESENT (Z95.810) CHF (CONGESTIVE HEART FAILURE) (I50.9) ASTHMA, ENDOGENOUS (J45.909) COPD, MODERATE (J44.9) DEPRESSION, CONTROLLED (F32.9) Impression: Followed by psychiatrist regularly ANTICOAGULATED (Z79.01) Impression: takes Eliquis OBESITY (BMI 35.0-39.9 WITHOUT COMORBIDITY) (E66.9) FAMILY HISTORY OF BREAST CANCER IN FEMALE (Z80.3) Impression: 2 cousins and one maternal grandmother had breast cancer DIABETES MELLITUS WITH INSULIN THERAPY (E11.9) SLEEP APNEA IN ADULT (G47.30) HISTORY OF LAPAROSCOPIC CHOLECYSTECTOMY (Z90.49) H/O: GI BLEED (Z87.19) Impression: Hospitalized 2017. Said to be small bowel ulcer which was cauterized.    Edsel Petrin. Dalbert Batman, M.D., [facility] General and Minimally invasive Surgery Breast and Colorectal Surgery Office:   [phone] Pager:   [phone]

## 2016-07-22 NOTE — Progress Notes (Signed)
Aaron Edelman Small called and informed of pt surgery date, type of surgery, and time of surgery.

## 2016-07-23 ENCOUNTER — Other Ambulatory Visit: Payer: Self-pay

## 2016-07-23 ENCOUNTER — Emergency Department (HOSPITAL_COMMUNITY): Payer: Medicare Other

## 2016-07-23 ENCOUNTER — Encounter (HOSPITAL_COMMUNITY): Payer: Self-pay | Admitting: Emergency Medicine

## 2016-07-23 ENCOUNTER — Telehealth (HOSPITAL_COMMUNITY): Payer: Self-pay

## 2016-07-23 ENCOUNTER — Emergency Department (HOSPITAL_COMMUNITY)
Admission: EM | Admit: 2016-07-23 | Discharge: 2016-07-23 | Disposition: A | Discharge status: Home / Self Care | Payer: Medicare Other | Attending: Emergency Medicine | Admitting: Emergency Medicine

## 2016-07-23 DIAGNOSIS — E109 Type 1 diabetes mellitus without complications: Secondary | ICD-10-CM | POA: Diagnosis not present

## 2016-07-23 DIAGNOSIS — Z87891 Personal history of nicotine dependence: Secondary | ICD-10-CM | POA: Insufficient documentation

## 2016-07-23 DIAGNOSIS — J449 Chronic obstructive pulmonary disease, unspecified: Secondary | ICD-10-CM | POA: Insufficient documentation

## 2016-07-23 DIAGNOSIS — Z853 Personal history of malignant neoplasm of breast: Secondary | ICD-10-CM | POA: Diagnosis not present

## 2016-07-23 DIAGNOSIS — Z7901 Long term (current) use of anticoagulants: Secondary | ICD-10-CM | POA: Insufficient documentation

## 2016-07-23 DIAGNOSIS — R42 Dizziness and giddiness: Secondary | ICD-10-CM | POA: Diagnosis not present

## 2016-07-23 DIAGNOSIS — I5022 Chronic systolic (congestive) heart failure: Secondary | ICD-10-CM | POA: Diagnosis not present

## 2016-07-23 DIAGNOSIS — I252 Old myocardial infarction: Secondary | ICD-10-CM | POA: Diagnosis not present

## 2016-07-23 DIAGNOSIS — I110 Hypertensive heart disease with heart failure: Secondary | ICD-10-CM | POA: Diagnosis not present

## 2016-07-23 DIAGNOSIS — I2510 Atherosclerotic heart disease of native coronary artery without angina pectoris: Secondary | ICD-10-CM | POA: Diagnosis not present

## 2016-07-23 DIAGNOSIS — E039 Hypothyroidism, unspecified: Secondary | ICD-10-CM | POA: Diagnosis not present

## 2016-07-23 DIAGNOSIS — R404 Transient alteration of awareness: Secondary | ICD-10-CM | POA: Diagnosis not present

## 2016-07-23 DIAGNOSIS — A059 Bacterial foodborne intoxication, unspecified: Secondary | ICD-10-CM | POA: Diagnosis not present

## 2016-07-23 DIAGNOSIS — R531 Weakness: Secondary | ICD-10-CM | POA: Diagnosis not present

## 2016-07-23 DIAGNOSIS — Z95810 Presence of automatic (implantable) cardiac defibrillator: Secondary | ICD-10-CM | POA: Diagnosis not present

## 2016-07-23 DIAGNOSIS — R0602 Shortness of breath: Secondary | ICD-10-CM | POA: Diagnosis not present

## 2016-07-23 DIAGNOSIS — R51 Headache: Secondary | ICD-10-CM | POA: Diagnosis not present

## 2016-07-23 LAB — CBC WITH DIFFERENTIAL/PLATELET
Basophils Absolute: 0.1 10*3/uL (ref 0.0–0.1)
Basophils Relative: 1 %
Eosinophils Absolute: 0.2 10*3/uL (ref 0.0–0.7)
Eosinophils Relative: 2 %
HCT: 34.3 % — ABNORMAL LOW (ref 36.0–46.0)
Hemoglobin: 11 g/dL — ABNORMAL LOW (ref 12.0–15.0)
Lymphocytes Relative: 17 %
Lymphs Abs: 1.4 10*3/uL (ref 0.7–4.0)
MCH: 27 pg (ref 26.0–34.0)
MCHC: 32.1 g/dL (ref 30.0–36.0)
MCV: 84.1 fL (ref 78.0–100.0)
Monocytes Absolute: 0.5 10*3/uL (ref 0.1–1.0)
Monocytes Relative: 6 %
Neutro Abs: 6.1 10*3/uL (ref 1.7–7.7)
Neutrophils Relative %: 74 %
Platelets: 274 10*3/uL (ref 150–400)
RBC: 4.08 MIL/uL (ref 3.87–5.11)
RDW: 17.8 % — ABNORMAL HIGH (ref 11.5–15.5)
WBC: 8.3 10*3/uL (ref 4.0–10.5)

## 2016-07-23 LAB — URINALYSIS, ROUTINE W REFLEX MICROSCOPIC
Bilirubin Urine: NEGATIVE
Glucose, UA: NEGATIVE mg/dL
Hgb urine dipstick: NEGATIVE
Ketones, ur: NEGATIVE mg/dL
Leukocytes, UA: NEGATIVE
Nitrite: NEGATIVE
Protein, ur: NEGATIVE mg/dL
Specific Gravity, Urine: 1.003 — ABNORMAL LOW (ref 1.005–1.030)
pH: 7 (ref 5.0–8.0)

## 2016-07-23 LAB — COMPREHENSIVE METABOLIC PANEL
ALT: 16 U/L (ref 14–54)
AST: 27 U/L (ref 15–41)
Albumin: 3.2 g/dL — ABNORMAL LOW (ref 3.5–5.0)
Alkaline Phosphatase: 62 U/L (ref 38–126)
Anion gap: 8 (ref 5–15)
BUN: 14 mg/dL (ref 6–20)
CO2: 28 mmol/L (ref 22–32)
Calcium: 9.2 mg/dL (ref 8.9–10.3)
Chloride: 100 mmol/L — ABNORMAL LOW (ref 101–111)
Creatinine, Ser: 0.88 mg/dL (ref 0.44–1.00)
GFR calc Af Amer: >60 mL/min (ref >60)
GFR calc non Af Amer: >60 mL/min (ref >60)
Glucose, Bld: 164 mg/dL — ABNORMAL HIGH (ref 65–99)
Potassium: 3.3 mmol/L — ABNORMAL LOW (ref 3.5–5.1)
Sodium: 136 mmol/L (ref 135–145)
Total Bilirubin: 1.3 mg/dL — ABNORMAL HIGH (ref 0.3–1.2)
Total Protein: 7.2 g/dL (ref 6.5–8.1)

## 2016-07-23 LAB — I-STAT TROPONIN, ED
Troponin i, poc: 0 ng/mL (ref 0.00–0.08)
Troponin i, poc: 0.01 ng/mL (ref 0.00–0.08)

## 2016-07-23 LAB — BRAIN NATRIURETIC PEPTIDE: B Natriuretic Peptide: 182.4 pg/mL — ABNORMAL HIGH (ref 0.0–100.0)

## 2016-07-23 MED ORDER — POTASSIUM CHLORIDE CRYS ER 20 MEQ PO TBCR
40.0000 meq | EXTENDED_RELEASE_TABLET | Freq: Once | ORAL | Status: AC
  Administered 2016-07-23: 40 meq via ORAL
  Filled 2016-07-23: qty 2

## 2016-07-23 MED ORDER — MECLIZINE HCL 12.5 MG PO TABS: 12.5000 mg | ORAL_TABLET | Freq: Three times a day (TID) | ORAL | 0 refills | Status: DC | PRN

## 2016-07-23 NOTE — ED Provider Notes (Signed)
[facility] DEPT Provider Note   CSN: [id number] Arrival date & time: 07/23/16  1115     History   Chief Complaint Chief Complaint  Patient presents with  . Dizziness    HPI [name] is a 60 y.o. female with history of CHF, COPD, atrial fibrillation, breast cancer with scheduled lumpectomy tomorrow who presents with acute onset dizziness upon waking this morning. Patient describes a room spinning dizziness that prevented her from getting up out of bed this morning. Patient reports that her dizziness is somewhat improved, but worsened with any head movement. Patient has had associated generalized fatigue. Patient also reports worsened shortness of breath. She reports she has been out of her digoxin and Spiriva for about one week. Patient reports some lower abdominal pain onset today. Patient reports 2 episodes of chest pain that were relieved by edge of blistering over the past week. Patient denies any nausea, vomiting, urinary symptoms, weakness of any particular extremity.  HPI  Past Medical History:  Diagnosis Date  . AICD (automatic cardioverter/defibrillator) present    [facility] jud[facility]  . Anxiety   . 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 Asthma   . CHF (congestive heart failure) ([place])   . COPD (chronic obstructive pulmonary disease) ([place])   . Depression    followed by a psychiatrist  . DYSLIPIDEMIA 05/01/2009  . Dysrhythmia    atrial fibrillation  . Gastroparesis   . GERD (gastroesophageal reflux disease)   . Headache   . History of breast cancer 2006   with return in 2008, s/p mastectomy, XRT and chemotherapy  . Hypertension   . 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 IBS (irritable bowel syndrome)   . ICD (implantable cardiac defibrillator) in place 11/10/2012   ICD (11/2012)  . Malignant neoplasm of lower-inner quadrant of left female breast ([place]) 06/26/2016  . Myocardial infarction   . OBESITY 07/24/2007  . OBSTRUCTIVE SLEEP APNEA 07/24/2007   with CPAP compliance  .  Osteoarthritis   . Persistent atrial fibrillation ([place]) 09/2014  . Presence of permanent cardiac pacemaker   . RESTLESS LEG SYNDROME 07/24/2007  . SYSTOLIC HEART FAILURE, CHRONIC 01/05/2009   a. chemo induced cardiomyopathy b. cMRI (02/2009) EF 45% c. Myoview 05/2010: EF 59%, no ischemia d. RHC (01/2012) RA 20, RV 55/13/22, PA 56/34 (44), PCWP 28, Fick CO/CI: 3.1 / 1.5, PVR 5.3 WU, PA sat 42% & 48% e. ECHO (10/2012) EF 20-25%, diff HK, MV calcified annulus f. ECHO (03/2014) EF 30-35%, sev HK, inferior/inferoseptal walls, mild MR    Patient Active Problem List   Diagnosis Date Noted  . Malignant neoplasm of lower-inner quadrant of left female breast ([place]) 06/26/2016  . Small bowel polyp   . CHF (congestive heart failure) ([place]) 12/21/2015  . Acute blood loss anemia   . Atrial fibrillation ([place]) 12/11/2015  . Benign neoplasm of descending colon   . Upper GI bleeding   . Iron deficiency anemia due to chronic blood loss   . Acute upper GI bleeding 11/14/2015  . Anemia 11/14/2015  . Absolute anemia   . Symptomatic anemia   . Bleeding gastrointestinal   . Chronic systolic CHF (congestive heart failure), NYHA class 2 ([place])   . PAF (paroxysmal atrial fibrillation) ([place]) 09/22/2015  . Chest pain at rest 08/19/2015  . Chest pain 08/19/2015  . NSVT (nonsustained ventricular tachycardia) ([place]) 06/30/2015  . Breast cancer of lower-outer quadrant of right female breast ([place]) 07/22/2014  . CAD (coronary artery disease) 02/17/2014  . Type 1 diabetes mellitus with neurological manifestations (  [place]) 01/06/2013  . PALPITATIONS 09/20/2010  . Mixed hypercholesterolemia and hypertriglyceridemia 05/01/2009  . HYPERTENSION, BENIGN 01/05/2009  . FOOT PAIN 11/29/2008  . Sinus tachycardia 10/25/2008  . 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 Obesity 07/24/2007  . Obstructive sleep apnea 07/24/2007  . RESTLESS LEG SYNDROME 07/24/2007    Past Surgical History:  Procedure Laterality Date  .  ABDOMINAL HYSTERECTOMY  1991  . BI-VENTRICULAR IMPLANTABLE CARDIOVERTER DEFIBRILLATOR N/A 11/10/2012   SJM Forify Assura single chamber ICD  . CARDIAC CATHETERIZATION N/A 08/21/2015   Procedure: Right/Left Heart Cath and Coronary Angiography;  Surgeon: Jolaine Artist, MD;  Location: [facility];  Service: Cardiovascular;  Laterality: N/A;  . CARDIAC DEFIBRILLATOR PLACEMENT  11/10/2012   SJM Fortify Assura VR implanted by Dr Rayann Heman for primary prevention  . CHOLECYSTECTOMY    . COLONOSCOPY N/A 11/17/2015   Procedure: COLONOSCOPY;  Surgeon: Jerene Bears, MD;  Location[facility] ENDOSCOPY;  Service: Endoscopy;  Laterality: N/A;  . ENTEROSCOPY N/A 03/26/2016   Procedure: ENTEROSCOPY;  Surgeon: Manus Gunning, MD;  Location: [facility] ENDOSCOPY;  Service: Gastroenterology;  Laterality: N/A;  . ESOPHAGOGASTRODUODENOSCOPY N/A 11/16/2015   Procedure: ESOPHAGOGASTRODUODENOSCOPY (EGD);  Surgeon: Manus Gunning, MD;  Location: [facility];  Service: Gastroenterology;  Laterality: N/A;  . ESOPHAGOGASTRODUODENOSCOPY N/A 02/24/2016   Procedure: ESOPHAGOGASTRODUODENOSCOPY (EGD);  Surgeon: Milus Banister, MD;  Location: [facility];  Service: Endoscopy;  Laterality: N/A;  . ESOPHAGOGASTRODUODENOSCOPY N/A 03/26/2016   Procedure: ESOPHAGOGASTRODUODENOSCOPY (EGD);  Surgeon: Manus Gunning, MD;  Location: [facility];  Service: Gastroenterology;  Laterality: N/A;  . EYE SURGERY     cataracts  . GIVENS CAPSULE STUDY N/A 12/11/2015   Procedure: GIVENS CAPSULE STUDY;  Surgeon: Manus Gunning, MD;  Location: [facility] [place];  Service: Gastroenterology;  Laterality: N/A;  . KNEE CARTILAGE SURGERY    . MASTECTOMY     right  . WRIST SURGERY Bilateral     OB History    No data available       Home Medications    Prior to Admission medications   Medication Sig Start Date End Date Taking? Authorizing Provider  acetaminophen (TYLENOL) 650 MG CR tablet Take 1,300 mg by mouth every 8 (eight)  hours as needed for pain.   Yes Historical Provider, MD  albuterol (PROVENTIL HFA) 108 (90 BASE) MCG/ACT inhaler Inhale 2 puffs into the lungs every 6 (six) hours as needed. For shortness of breath   Yes Historical Provider, MD  ALPRAZolam (XANAX) 0.5 MG tablet Take 0.25 mg by mouth 4 (four) times daily as needed for anxiety.  11/06/15  Yes Historical Provider, MD  amiodarone (PACERONE) 200 MG tablet Take 1 tablet (200 mg total) by mouth daily. 07/11/16  Yes Jolaine Artist, MD  apixaban (ELIQUIS) 5 MG TABS tablet Take 1 tablet (5 mg total) by mouth 2 (two) times daily. 07/11/16  Yes Jolaine Artist, MD  baclofen (LIORESAL) 20 MG tablet Take 20 mg by mouth 2 (two) times daily as needed for muscle spasms.   Yes Historical Provider, MD  busPIRone (BUSPAR) 30 MG tablet Take 30 mg by mouth 2 (two) times daily.    Yes Historical Provider, MD  carvedilol (COREG) 6.25 MG tablet Take 1 tablet (6.25 mg total) by mouth 2 (two) times daily with a meal. 07/11/16  Yes Jolaine Artist, MD  digoxin (LANOXIN) 0.125 MG tablet Take 1 tablet (0.125 mg total) by mouth every other day. 07/11/16  Yes Jolaine Artist, MD  docusate sodium (COLACE) 100 MG capsule Take 3 capsules (300 mg total) by mouth at bedtime. 10/03/15  Yes Amy D Clegg, NP  DULoxetine (CYMBALTA) 30 MG capsule Take 30 mg by mouth daily.   Yes Amy A Moon, NP  Fe Cbn-Fe Gluc-FA-B12-C-DSS (FERRALET 90) 90-1 MG TABS Take 1 tablet by mouth daily. 02/25/16  Yes Cherene Altes, MD  furosemide (LASIX) 20 MG tablet Take 3 tablets (60 mg total) by mouth 2 (two) times daily. If 1 to 2 pound weight gain in a day, take extra 20 mg dose 07/11/16  Yes Larey Dresser, MD  Garlic [id number] MG CAPS Take 500 mg by mouth daily after lunch.    Yes Historical Provider, MD  HUMALOG KWIKPEN 100 UNIT/ML KiwkPen Inject 14-24 Units into the skin 3 (three) times daily. On average per patient 24 unit with breakfast, 16 units with lunch, & 14-16 units with supper 10/30/15  Yes  Historical Provider, MD  HUMALOG MIX 75/25 KWIKPEN (75-25) 100 UNIT/ML Kwikpen Inject 75 Units into the skin 2 (two) times daily. 11/21/15  Yes Historical Provider, MD  hydrOXYzine (ATARAX/VISTARIL) 50 MG tablet Take 50 mg by mouth every 6 (six) hours as needed for itching ((primarily taken at bedtime)).    Yes Historical Provider, MD  ivabradine (CORLANOR) 5 MG TABS tablet Take 1 tablet (5 mg total) by mouth 2 (two) times daily with a meal. 07/11/16  Yes Jolaine Artist, MD  levothyroxine (SYNTHROID, LEVOTHROID) 75 MCG tablet Take 75 mcg by mouth daily before breakfast.    Yes Historical Provider, MD  Melatonin 1 MG TABS Take 0.5 mg by mouth at bedtime.   Yes Historical Provider, MD  metolazone (ZAROXOLYN) 2.5 MG tablet Take 1 tablet (2.5 mg total) by mouth as needed (for weight gain >3 lbs). 07/11/16  Yes Shaune Pascal Bensimhon, MD  mometasone (NASONEX) 50 MCG/ACT nasal spray Place 1 spray into both nostrils daily. allergies   Yes Historical Provider, MD  Multiple Minerals (CALCIUM-MAGNESIUM-ZINC) TABS Take 1 tablet by mouth daily after lunch.    Yes Historical Provider, MD  nitroGLYCERIN (NITROSTAT) 0.4 MG SL tablet Place 1 tablet (0.4 mg total) under the tongue every 5 (five) minutes as needed. For chest pain 07/02/16  Yes Jolaine Artist, MD  Omega-3 Fatty Acids (FISH OIL) 1000 MG CAPS Take 2 capsules by mouth daily.   Yes Historical Provider, MD  pantoprazole (PROTONIX) 40 MG tablet Take 40 mg by mouth 2 (two) times daily.     Yes Historical Provider, MD  polyethylene glycol powder (GLYCOLAX/MIRALAX) powder Mix 17 grams in 8 oz of water twice daily 06/04/16  Yes Manus Gunning, MD  potassium chloride SA (K-DUR,KLOR-CON) 20 MEQ tablet Take 3 tablets (60 mEq total) by mouth 3 (three) times daily. Patient taking differently: Take 60 mEq by mouth 2 (two) times daily. 3 tablet (60 MEQ) TWICE DAILY SCHEDULED AND IF PATIENT TAKES ADDITIONAL LASIX TAKES ADDITIONAL 20 MEQ-- IF ADDT'L LASIX AND  METALAZONE PATIENT TAKES ADDITIONAL 40 MEQ. 07/11/16  Yes Shaune Pascal Bensimhon, MD  Propylene Glycol (SYSTANE BALANCE) 0.6 % SOLN Place 1 drop into both eyes 3 (three) times daily.   Yes Historical Provider, MD  pyridOXINE (VITAMIN B-6) 100 MG tablet Take 100 mg by mouth daily after lunch.    Yes Historical Provider, MD  ramipril (ALTACE) 2.5 MG capsule Take 1 capsule (2.5 mg total) by mouth daily. Patient taking differently: Take 2.5 mg by mouth at bedtime.  07/11/16  Yes Shaune Pascal  Bensimhon, MD  rOPINIRole (REQUIP) 1 MG tablet Take 1 mg by mouth at bedtime and may repeat dose one time if needed. IF RESTLESS LEGS PERSIST PATIENT MAY REPEAT ADDITIONAL DOSE   Yes Historical Provider, MD  rosuvastatin (CRESTOR) 10 MG tablet Take 10 mg by mouth at bedtime. 06/18/16  Yes Historical Provider, MD  Simethicone Extra Strength 125 MG CAPS Take 1 tablet by mouth daily as needed (for bloating). Reported on 02/23/2016   Yes Historical Provider, MD  spironolactone (ALDACTONE) 25 MG tablet Take 1 tablet (25 mg total) by mouth daily. 07/11/16  Yes Jolaine Artist, MD  SUMAtriptan (IMITREX) 100 MG tablet Take 100 mg by mouth daily as needed for migraine.  11/28/15  Yes Historical Provider, MD  tiotropium (SPIRIVA) 18 MCG inhalation capsule Place 18 mcg into inhaler and inhale daily.     Yes Historical Provider, MD  vitamin B-12 (CYANOCOBALAMIN) 1000 MCG tablet Take 1,000 mcg by mouth daily after lunch.    Yes Historical Provider, MD  vitamin C (ASCORBIC ACID) 500 MG tablet Take 500 mg by mouth daily after lunch.    Yes Historical Provider, MD  vitamin E 400 UNIT capsule Take 400 Units by mouth daily after lunch.    Yes Historical Provider, MD  meclizine (ANTIVERT) 12.5 MG tablet Take 1 tablet (12.5 mg total) by mouth 3 (three) times daily as needed for dizziness. 07/23/16   Frederica Kuster, PA-C    Family History Family History  Problem Relation Age of Onset  . Diabetes Mellitus II Mother   . Lung cancer Mother   .  Congestive Heart Failure Mother   . Coronary artery disease Father     CABG x 3  . Hypertension Father   . Diabetes Mellitus II Father   . Breast cancer Cousin   . Breast cancer Cousin   . Uterine cancer Cousin   . Colon cancer Maternal Aunt   . Uterine cancer Maternal Aunt   . Lung cancer Maternal Aunt   . Uterine cancer Maternal Aunt   . Colon cancer Maternal Aunt     Social History Social History  Substance Use Topics  . Smoking status: Former Smoker    Quit date: 04/12/2001  . Smokeless tobacco: Never Used  . Alcohol use No     Allergies   Bee venom; Aspirin; Ciprofloxacin; Codeine; Naproxen; Other; Sulfonamide derivatives; Xarelto [rivaroxaban]; Atorvastatin; Nsaids; Tape; and Tresiba flextouch [insulin degludec]   Review of Systems Review of Systems  Constitutional: Positive for fatigue. Negative for chills and fever.  HENT: Negative for facial swelling and sore throat.   Respiratory: Negative for shortness of breath.   Cardiovascular: Negative for chest pain.  Gastrointestinal: Negative for abdominal pain, nausea and vomiting.  Genitourinary: Negative for dysuria.  Musculoskeletal: Negative for back pain.  Skin: Negative for rash and wound.  Neurological: Positive for dizziness. Negative for headaches.  Psychiatric/Behavioral: The patient is not nervous/anxious.      Physical Exam Updated Vital Signs BP 107/60   Pulse 84   Temp 98.2 F (36.8 C) (Oral)   Resp 21   Ht 5\' 5"  (1.651 m)   Wt 98 kg   SpO2 98%   BMI 35.94 kg/m   Physical Exam  Constitutional: She appears well-developed and well-nourished. No distress.  HENT:  Head: Normocephalic and atraumatic.  Mouth/Throat: Oropharynx is clear and moist. No oropharyngeal exudate.  Eyes: Conjunctivae are normal. Pupils are equal, round, and reactive to light. Right eye exhibits no discharge. Left  eye exhibits no discharge. No scleral icterus.  Neck: Normal range of motion. Neck supple. No thyromegaly  present.  Cardiovascular: Normal rate, regular rhythm, normal heart sounds and intact distal pulses.  Exam reveals no gallop and no friction rub.   No murmur heard. Pulmonary/Chest: Effort normal and breath sounds normal. No stridor. No respiratory distress. She has no wheezes. She has no rales.  Abdominal: Soft. Bowel sounds are normal. She exhibits no distension. There is no tenderness. There is no rebound and no guarding.    Musculoskeletal: She exhibits no edema.  Lymphadenopathy:    She has no cervical adenopathy.  Neurological: She is alert. Coordination normal.  CN 3-12 intact; normal sensation throughout; 5/5 strength in all 4 extremities; equal bilateral grip strength; no ataxia on finger to nose; normal heel-to-shin test  Skin: Skin is warm and dry. No rash noted. She is not diaphoretic. No pallor.  Psychiatric: She has a normal mood and affect.  Nursing note and vitals reviewed.    ED Treatments / Results  Labs (all labs ordered are listed, but only abnormal results are displayed) Labs Reviewed  COMPREHENSIVE METABOLIC PANEL - Abnormal; Notable for the following:       Result Value   Potassium 3.3 (*)    Chloride 100 (*)    Glucose, Bld 164 (*)    Albumin 3.2 (*)    Total Bilirubin 1.3 (*)    All other components within normal limits  CBC WITH DIFFERENTIAL/PLATELET - Abnormal; Notable for the following:    Hemoglobin 11.0 (*)    HCT 34.3 (*)    RDW 17.8 (*)    All other components within normal limits  BRAIN NATRIURETIC PEPTIDE - Abnormal; Notable for the following:    B Natriuretic Peptide 182.4 (*)    All other components within normal limits  URINALYSIS, ROUTINE W REFLEX MICROSCOPIC - Abnormal; Notable for the following:    Color, Urine STRAW (*)    Specific Gravity, Urine 1.003 (*)    All other components within normal limits  I-STAT TROPOININ, ED  I-STAT TROPOININ, ED    EKG  EKG Interpretation  Date/Time:  Tuesday July 23 2016 12:10:11  EST Ventricular Rate:  86 PR Interval:    QRS Duration: 148 QT Interval:  389 QTC Calculation: 466 R Axis:   -87 Text Interpretation:  Sinus rhythm Ventricular premature complex Prolonged PR interval Probable left atrial enlargement Nonspecific IVCD with LAD Left ventricular hypertrophy Anterior Q waves, possibly due to LVH Confirmed by [facility] MD, Corene Cornea [id number]) on 07/23/2016 2:03:01 PM       Radiology Dg Chest 2 View  Result Date: 07/23/2016 CLINICAL DATA:  Shortness of breath and dizziness beginning today. Headache. EXAM: CHEST  2 VIEW COMPARISON:  03/16/2016 FINDINGS: The heart is enlarged. Pacemaker/AICD remains in place. There is venous hypertension with mild interstitial prominence suggesting fluid overload/ congestive heart failure. No alveolar edema, collapse or measurable effusion. No acute bone finding. IMPRESSION: Probable congestive heart failure. Cardiomegaly and venous hypertension with early interstitial edema. Electronically Signed   By: Nelson Chimes M.D.   On: 07/23/2016 12:44   Ct Head Wo Contrast  Result Date: 07/23/2016 CLINICAL DATA:  Dizziness, frontal headache. EXAM: CT HEAD WITHOUT CONTRAST TECHNIQUE: Contiguous axial images were obtained from the base of the skull through the vertex without intravenous contrast. COMPARISON:  CT scan of November 14, 2015. FINDINGS: Brain: No mass effect or midline shift is noted. Ventricular size is within normal limits. There is no evidence  of mass lesion, hemorrhage or acute infarction. Vascular: Atherosclerosis of carotid siphons is noted. Skull: Normal. Negative for fracture or focal lesion. Sinuses/Orbits: Mild bilateral maxillary mucosal thickening is noted. Other: None. IMPRESSION: No acute intracranial abnormality seen. Electronically Signed   By: Marijo Conception, M.D.   On: 07/23/2016 13:11    Procedures Procedures (including critical care time)  Medications Ordered in ED Medications  potassium chloride SA (K-DUR,KLOR-CON) CR  tablet 40 mEq (40 mEq Oral Given 07/23/16 1605)     Initial Impression / Assessment and Plan / ED Course  I have reviewed the triage vital signs and the nursing notes.  Pertinent labs & imaging results that were available during my care of the patient were reviewed by me and considered in my medical decision making (see chart for details).  Clinical Course     Patient symptoms resolved in the ED. Patient without dizziness or abdominal pain. Patient no longer short of breath. Patient with most likely peripheral vertigo, as her symptoms were worsened with head movement, doubt central cause. CBC shows stable chronic anemia, hemoglobin 11. CMP shows potassium 3.3, replacement ED; chloride 100, total bilirubin 1.3, albumin 3.2, glucose 164. Delta troponin negative. BNP 182.4. UA negative. CT head negative for tumor. EKG shows NSR, prolonged PR interval. CXR shows probable CHF, cardiac megaly and venous hypertension with early interstitial edema. Patient's shortness of breath most likely because patient has been out of spironolactone. She is supposed to get her medicine in the mail today or tomorrow. I advised patient to take this as soon as she receives it.  Return precautions discussed. Patient understands and agrees with plan. Patient vitals stable and patient well-appearing at discharge. I discussed patient case with Dr. Dayna Barker who guided the patient's management and agrees with plan.   Final Clinical Impressions(s) / ED Diagnoses   Final diagnoses:  Dizziness    New Prescriptions New Prescriptions   MECLIZINE (ANTIVERT) 12.5 MG TABLET    Take 1 tablet (12.5 mg total) by mouth 3 (three) times daily as needed for dizziness.     Frederica Kuster, PA-C 07/23/16 1626    Merrily Pew, MD 07/24/16 [phone]

## 2016-07-23 NOTE — ED Triage Notes (Signed)
Pt arrives from home by ems. Pt lives in a camper outside of her brothers home. Pt states she had a Chemo seed placed in her left breast yesterday and is scheduled for a lumpectomy tomorrow at this hospital. Pt states she has been feeling weak since yesterday and this morning upon waking up she stood up and became very dizzy and felt weak all over. Pt States she was actually unable to ambulate this morning because of feeling weak. Pt is alert and ox4. Arrives with a 20G in left forearm saline locked.  Denies any pain.

## 2016-07-23 NOTE — Discharge Instructions (Signed)
Medications: Meclizine  Treatment: Take meclizine 3 times daily as needed for dizziness. Make sure to take your medicines at since you get them in the mail.  Follow-up: Please follow-up with your doctor as soon as possible for further evaluation and treatment of your symptoms. Please return to the emergency department if you develop any new or worsening symptoms.

## 2016-07-24 ENCOUNTER — Ambulatory Visit (HOSPITAL_COMMUNITY): Payer: Medicare Other | Admitting: Emergency Medicine

## 2016-07-24 ENCOUNTER — Encounter (HOSPITAL_COMMUNITY): Payer: Self-pay | Admitting: *Deleted

## 2016-07-24 ENCOUNTER — Ambulatory Visit (HOSPITAL_COMMUNITY): Payer: Medicare Other | Admitting: Certified Registered Nurse Anesthetist

## 2016-07-24 ENCOUNTER — Ambulatory Visit (HOSPITAL_COMMUNITY)
Admission: RE | Admit: 2016-07-24 | Discharge: 2016-07-24 | Disposition: A | Discharge status: Home / Self Care | Payer: Medicare Other | Source: Ambulatory Visit | Attending: General Surgery | Admitting: General Surgery

## 2016-07-24 ENCOUNTER — Encounter (HOSPITAL_COMMUNITY)
Admission: RE | Admit: 2016-07-24 | Discharge: 2016-07-24 | Disposition: A | Discharge status: Home / Self Care | Payer: Medicare Other | Source: Ambulatory Visit | Attending: General Surgery | Admitting: General Surgery

## 2016-07-24 ENCOUNTER — Encounter (HOSPITAL_COMMUNITY)
Admission: RE | Disposition: A | Discharge status: Home / Self Care | Payer: Self-pay | Source: Ambulatory Visit | Attending: General Surgery

## 2016-07-24 DIAGNOSIS — E119 Type 2 diabetes mellitus without complications: Secondary | ICD-10-CM | POA: Diagnosis not present

## 2016-07-24 DIAGNOSIS — K259 Gastric ulcer, unspecified as acute or chronic, without hemorrhage or perforation: Secondary | ICD-10-CM | POA: Diagnosis not present

## 2016-07-24 DIAGNOSIS — Z8049 Family history of malignant neoplasm of other genital organs: Secondary | ICD-10-CM | POA: Diagnosis not present

## 2016-07-24 DIAGNOSIS — M1990 Unspecified osteoarthritis, unspecified site: Secondary | ICD-10-CM | POA: Diagnosis not present

## 2016-07-24 DIAGNOSIS — Z803 Family history of malignant neoplasm of breast: Secondary | ICD-10-CM | POA: Insufficient documentation

## 2016-07-24 DIAGNOSIS — Z87891 Personal history of nicotine dependence: Secondary | ICD-10-CM | POA: Diagnosis not present

## 2016-07-24 DIAGNOSIS — I4891 Unspecified atrial fibrillation: Secondary | ICD-10-CM | POA: Diagnosis not present

## 2016-07-24 DIAGNOSIS — I110 Hypertensive heart disease with heart failure: Secondary | ICD-10-CM | POA: Insufficient documentation

## 2016-07-24 DIAGNOSIS — C50312 Malignant neoplasm of lower-inner quadrant of left female breast: Secondary | ICD-10-CM | POA: Diagnosis not present

## 2016-07-24 DIAGNOSIS — I509 Heart failure, unspecified: Secondary | ICD-10-CM | POA: Diagnosis not present

## 2016-07-24 DIAGNOSIS — Z9221 Personal history of antineoplastic chemotherapy: Secondary | ICD-10-CM | POA: Insufficient documentation

## 2016-07-24 DIAGNOSIS — Z6835 Body mass index (BMI) 35.0-35.9, adult: Secondary | ICD-10-CM | POA: Diagnosis not present

## 2016-07-24 DIAGNOSIS — C50912 Malignant neoplasm of unspecified site of left female breast: Secondary | ICD-10-CM | POA: Diagnosis not present

## 2016-07-24 DIAGNOSIS — Z7901 Long term (current) use of anticoagulants: Secondary | ICD-10-CM | POA: Diagnosis not present

## 2016-07-24 DIAGNOSIS — N6012 Diffuse cystic mastopathy of left breast: Secondary | ICD-10-CM | POA: Insufficient documentation

## 2016-07-24 DIAGNOSIS — C50311 Malignant neoplasm of lower-inner quadrant of right female breast: Secondary | ICD-10-CM

## 2016-07-24 DIAGNOSIS — G4733 Obstructive sleep apnea (adult) (pediatric): Secondary | ICD-10-CM | POA: Diagnosis not present

## 2016-07-24 DIAGNOSIS — Z95810 Presence of automatic (implantable) cardiac defibrillator: Secondary | ICD-10-CM | POA: Insufficient documentation

## 2016-07-24 DIAGNOSIS — I252 Old myocardial infarction: Secondary | ICD-10-CM | POA: Insufficient documentation

## 2016-07-24 DIAGNOSIS — F329 Major depressive disorder, single episode, unspecified: Secondary | ICD-10-CM | POA: Insufficient documentation

## 2016-07-24 DIAGNOSIS — Z9011 Acquired absence of right breast and nipple: Secondary | ICD-10-CM | POA: Insufficient documentation

## 2016-07-24 DIAGNOSIS — Z171 Estrogen receptor negative status [ER-]: Principal | ICD-10-CM

## 2016-07-24 DIAGNOSIS — J449 Chronic obstructive pulmonary disease, unspecified: Secondary | ICD-10-CM | POA: Insufficient documentation

## 2016-07-24 DIAGNOSIS — K219 Gastro-esophageal reflux disease without esophagitis: Secondary | ICD-10-CM | POA: Insufficient documentation

## 2016-07-24 DIAGNOSIS — I2510 Atherosclerotic heart disease of native coronary artery without angina pectoris: Secondary | ICD-10-CM | POA: Diagnosis not present

## 2016-07-24 DIAGNOSIS — G8918 Other acute postprocedural pain: Secondary | ICD-10-CM | POA: Diagnosis not present

## 2016-07-24 DIAGNOSIS — Z9981 Dependence on supplemental oxygen: Secondary | ICD-10-CM | POA: Insufficient documentation

## 2016-07-24 DIAGNOSIS — E7800 Pure hypercholesterolemia, unspecified: Secondary | ICD-10-CM | POA: Insufficient documentation

## 2016-07-24 DIAGNOSIS — Z853 Personal history of malignant neoplasm of breast: Secondary | ICD-10-CM | POA: Insufficient documentation

## 2016-07-24 DIAGNOSIS — Z794 Long term (current) use of insulin: Secondary | ICD-10-CM | POA: Insufficient documentation

## 2016-07-24 DIAGNOSIS — Z86010 Personal history of colonic polyps: Secondary | ICD-10-CM | POA: Insufficient documentation

## 2016-07-24 HISTORY — PX: BREAST LUMPECTOMY WITH RADIOACTIVE SEED AND SENTINEL LYMPH NODE BIOPSY: SHX6550

## 2016-07-24 HISTORY — PX: BREAST LUMPECTOMY: SHX2

## 2016-07-24 LAB — PROTIME-INR
INR: 1.22
PROTHROMBIN TIME: 15.4 s — AB (ref 11.4–15.2)

## 2016-07-24 LAB — GLUCOSE, CAPILLARY
GLUCOSE-CAPILLARY: 204 mg/dL — AB (ref 65–99)
Glucose-Capillary: 177 mg/dL — ABNORMAL HIGH (ref 65–99)

## 2016-07-24 SURGERY — BREAST LUMPECTOMY WITH RADIOACTIVE SEED AND SENTINEL LYMPH NODE BIOPSY
Anesthesia: General | Site: Breast | Laterality: Left

## 2016-07-24 MED ORDER — FENTANYL CITRATE (PF) 100 MCG/2ML IJ SOLN: 100.0000 ug | Freq: Once | INTRAMUSCULAR | Status: DC

## 2016-07-24 MED ORDER — 0.9 % SODIUM CHLORIDE (POUR BTL) OPTIME
TOPICAL | Status: DC | PRN
  Administered 2016-07-24: 1000 mL

## 2016-07-24 MED ORDER — MIDAZOLAM HCL 2 MG/2ML IJ SOLN: 0.5000 mg | Freq: Once | INTRAMUSCULAR | Status: DC | PRN

## 2016-07-24 MED ORDER — HYDROCODONE-ACETAMINOPHEN 5-325 MG PO TABS: 1.0000 | ORAL_TABLET | Freq: Four times a day (QID) | ORAL | 0 refills | Status: DC | PRN

## 2016-07-24 MED ORDER — TECHNETIUM TC 99M SULFUR COLLOID FILTERED
1.0000 | Freq: Once | INTRAVENOUS | Status: AC | PRN
  Administered 2016-07-24: 1 via INTRADERMAL

## 2016-07-24 MED ORDER — MIDAZOLAM HCL 2 MG/2ML IJ SOLN
2.0000 mg | Freq: Once | INTRAMUSCULAR | Status: DC
Start: 2016-07-24 — End: 2016-07-24

## 2016-07-24 MED ORDER — MIDAZOLAM HCL 2 MG/2ML IJ SOLN
INTRAMUSCULAR | Status: AC
  Administered 2016-07-24: 1 mg
  Filled 2016-07-24: qty 2

## 2016-07-24 MED ORDER — BUPIVACAINE-EPINEPHRINE 0.25% -1:200000 IJ SOLN
INTRAMUSCULAR | Status: DC | PRN
  Administered 2016-07-24: 8 mL

## 2016-07-24 MED ORDER — ROCURONIUM BROMIDE 10 MG/ML (PF) SYRINGE
PREFILLED_SYRINGE | INTRAVENOUS | Status: DC | PRN
  Administered 2016-07-24: 30 mg via INTRAVENOUS

## 2016-07-24 MED ORDER — PROPOFOL 10 MG/ML IV BOLUS
INTRAVENOUS | Status: AC
  Filled 2016-07-24: qty 20

## 2016-07-24 MED ORDER — ETOMIDATE 2 MG/ML IV SOLN
INTRAVENOUS | Status: AC
  Filled 2016-07-24: qty 10

## 2016-07-24 MED ORDER — SUGAMMADEX SODIUM 200 MG/2ML IV SOLN
INTRAVENOUS | Status: DC | PRN
  Administered 2016-07-24: 200 mg via INTRAVENOUS

## 2016-07-24 MED ORDER — CHLORHEXIDINE GLUCONATE CLOTH 2 % EX PADS: 6.0000 | MEDICATED_PAD | Freq: Once | CUTANEOUS | Status: DC

## 2016-07-24 MED ORDER — LACTATED RINGERS IV SOLN
INTRAVENOUS | Status: DC
  Administered 2016-07-24 (×2): via INTRAVENOUS

## 2016-07-24 MED ORDER — CEFAZOLIN SODIUM-DEXTROSE 2-4 GM/100ML-% IV SOLN
2.0000 g | INTRAVENOUS | Status: AC
  Administered 2016-07-24: 2 g via INTRAVENOUS

## 2016-07-24 MED ORDER — SODIUM CHLORIDE 0.9 % IJ SOLN
INTRAMUSCULAR | Status: AC
  Filled 2016-07-24: qty 10

## 2016-07-24 MED ORDER — ONDANSETRON HCL 4 MG/2ML IJ SOLN
INTRAMUSCULAR | Status: AC
  Filled 2016-07-24: qty 2

## 2016-07-24 MED ORDER — METHYLENE BLUE 0.5 % INJ SOLN
INTRAVENOUS | Status: AC
  Filled 2016-07-24: qty 10

## 2016-07-24 MED ORDER — ETOMIDATE 2 MG/ML IV SOLN
INTRAVENOUS | Status: DC | PRN
  Administered 2016-07-24: 16 mg via INTRAVENOUS

## 2016-07-24 MED ORDER — PROMETHAZINE HCL 25 MG/ML IJ SOLN: 6.2500 mg | INTRAMUSCULAR | Status: DC | PRN

## 2016-07-24 MED ORDER — FENTANYL CITRATE (PF) 100 MCG/2ML IJ SOLN
INTRAMUSCULAR | Status: AC
  Filled 2016-07-24: qty 2

## 2016-07-24 MED ORDER — BUPIVACAINE-EPINEPHRINE (PF) 0.5% -1:200000 IJ SOLN
INTRAMUSCULAR | Status: DC | PRN
  Administered 2016-07-24: 30 mL

## 2016-07-24 MED ORDER — SODIUM CHLORIDE 0.9 % IJ SOLN
INTRAMUSCULAR | Status: DC | PRN
  Administered 2016-07-24: 5 mL

## 2016-07-24 MED ORDER — ROCURONIUM BROMIDE 50 MG/5ML IV SOSY
PREFILLED_SYRINGE | INTRAVENOUS | Status: AC
  Filled 2016-07-24: qty 5

## 2016-07-24 MED ORDER — FENTANYL CITRATE (PF) 100 MCG/2ML IJ SOLN
INTRAMUSCULAR | Status: DC | PRN
  Administered 2016-07-24: 50 ug via INTRAVENOUS
  Administered 2016-07-24: 25 ug via INTRAVENOUS

## 2016-07-24 MED ORDER — FENTANYL CITRATE (PF) 100 MCG/2ML IJ SOLN
INTRAMUSCULAR | Status: AC
  Administered 2016-07-24: 50 ug
  Filled 2016-07-24: qty 2

## 2016-07-24 MED ORDER — ONDANSETRON HCL 4 MG/2ML IJ SOLN
INTRAMUSCULAR | Status: DC | PRN
  Administered 2016-07-24: 4 mg via INTRAVENOUS

## 2016-07-24 MED ORDER — FENTANYL CITRATE (PF) 100 MCG/2ML IJ SOLN: 25.0000 ug | INTRAMUSCULAR | Status: DC | PRN

## 2016-07-24 MED ORDER — MIDAZOLAM HCL 2 MG/2ML IJ SOLN
INTRAMUSCULAR | Status: AC
  Filled 2016-07-24: qty 2

## 2016-07-24 MED ORDER — SUGAMMADEX SODIUM 200 MG/2ML IV SOLN
INTRAVENOUS | Status: AC
  Filled 2016-07-24: qty 2

## 2016-07-24 MED ORDER — MEPERIDINE HCL 25 MG/ML IJ SOLN: 6.2500 mg | INTRAMUSCULAR | Status: DC | PRN

## 2016-07-24 MED ORDER — CEFAZOLIN SODIUM-DEXTROSE 2-4 GM/100ML-% IV SOLN
INTRAVENOUS | Status: AC
  Filled 2016-07-24: qty 100

## 2016-07-24 MED ORDER — BUPIVACAINE-EPINEPHRINE (PF) 0.25% -1:200000 IJ SOLN
INTRAMUSCULAR | Status: AC
  Filled 2016-07-24: qty 30

## 2016-07-24 SURGICAL SUPPLY — 53 items
ADH SKN CLS APL DERMABOND .7 (GAUZE/BANDAGES/DRESSINGS) ×1
APPLIER CLIP 9.375 MED OPEN (MISCELLANEOUS) ×2
APR CLP MED 9.3 20 MLT OPN (MISCELLANEOUS) ×1
BINDER BREAST LRG (GAUZE/BANDAGES/DRESSINGS) IMPLANT
BINDER BREAST XLRG (GAUZE/BANDAGES/DRESSINGS) ×1 IMPLANT
BLADE SURG 15 STRL LF DISP TIS (BLADE) ×1 IMPLANT
BLADE SURG 15 STRL SS (BLADE) ×2
CANISTER SUCTION 2500CC (MISCELLANEOUS) ×2 IMPLANT
CHLORAPREP W/TINT 26ML (MISCELLANEOUS) ×2 IMPLANT
CLIP APPLIE 9.375 MED OPEN (MISCELLANEOUS) ×1 IMPLANT
CONT SPEC 4OZ CLIKSEAL STRL BL (MISCELLANEOUS) ×2 IMPLANT
COVER PROBE W GEL 5X96 (DRAPES) ×2 IMPLANT
COVER SURGICAL LIGHT HANDLE (MISCELLANEOUS) ×2 IMPLANT
DERMABOND ADVANCED (GAUZE/BANDAGES/DRESSINGS) ×1
DERMABOND ADVANCED .7 DNX12 (GAUZE/BANDAGES/DRESSINGS) ×1 IMPLANT
DEVICE DUBIN SPECIMEN MAMMOGRA (MISCELLANEOUS) ×2 IMPLANT
DRAPE CHEST BREAST 15X10 FENES (DRAPES) ×2 IMPLANT
DRAPE PROXIMA HALF (DRAPES) ×2 IMPLANT
DRAPE UTILITY XL STRL (DRAPES) ×2 IMPLANT
DRSG PAD ABDOMINAL 8X10 ST (GAUZE/BANDAGES/DRESSINGS) ×2 IMPLANT
ELECT CAUTERY BLADE 6.4 (BLADE) ×2 IMPLANT
ELECT REM PT RETURN 9FT ADLT (ELECTROSURGICAL) ×2
ELECTRODE REM PT RTRN 9FT ADLT (ELECTROSURGICAL) ×1 IMPLANT
GAUZE SPONGE 4X4 12PLY STRL (GAUZE/BANDAGES/DRESSINGS) ×2 IMPLANT
GLOVE EUDERMIC 7 POWDERFREE (GLOVE) ×2 IMPLANT
GOWN STRL REUS W/ TWL LRG LVL3 (GOWN DISPOSABLE) ×1 IMPLANT
GOWN STRL REUS W/ TWL XL LVL3 (GOWN DISPOSABLE) ×1 IMPLANT
GOWN STRL REUS W/TWL LRG LVL3 (GOWN DISPOSABLE) ×2
GOWN STRL REUS W/TWL XL LVL3 (GOWN DISPOSABLE) ×2
ILLUMINATOR WAVEGUIDE N/F (MISCELLANEOUS) IMPLANT
KIT BASIN OR (CUSTOM PROCEDURE TRAY) ×2 IMPLANT
KIT MARKER MARGIN INK (KITS) ×2 IMPLANT
LIGHT WAVEGUIDE WIDE FLAT (MISCELLANEOUS) IMPLANT
NDL HYPO 25X1 1.5 SAFETY (NEEDLE) ×1 IMPLANT
NDL SAFETY ECLIPSE 18X1.5 (NEEDLE) IMPLANT
NEEDLE FILTER BLUNT 18X 1/2SAF (NEEDLE)
NEEDLE FILTER BLUNT 18X1 1/2 (NEEDLE) IMPLANT
NEEDLE HYPO 18GX1.5 SHARP (NEEDLE)
NEEDLE HYPO 25X1 1.5 SAFETY (NEEDLE) ×2 IMPLANT
NS IRRIG 1000ML POUR BTL (IV SOLUTION) ×2 IMPLANT
PACK SURGICAL SETUP 50X90 (CUSTOM PROCEDURE TRAY) ×2 IMPLANT
PENCIL BUTTON HOLSTER BLD 10FT (ELECTRODE) ×2 IMPLANT
SPONGE LAP 18X18 X RAY DECT (DISPOSABLE) IMPLANT
SPONGE LAP 4X18 X RAY DECT (DISPOSABLE) ×2 IMPLANT
SUT MNCRL AB 4-0 PS2 18 (SUTURE) ×2 IMPLANT
SUT SILK 2 0 SH (SUTURE) IMPLANT
SUT VIC AB 3-0 SH 18 (SUTURE) ×3 IMPLANT
SYR BULB 3OZ (MISCELLANEOUS) ×2 IMPLANT
SYR CONTROL 10ML LL (SYRINGE) ×2 IMPLANT
TOWEL OR 17X24 6PK STRL BLUE (TOWEL DISPOSABLE) ×1 IMPLANT
TOWEL OR 17X26 10 PK STRL BLUE (TOWEL DISPOSABLE) ×2 IMPLANT
TUBE CONNECTING 12X1/4 (SUCTIONS) ×2 IMPLANT
YANKAUER SUCT BULB TIP NO VENT (SUCTIONS) ×2 IMPLANT

## 2016-07-24 NOTE — Anesthesia Procedure Notes (Signed)
Anesthesia Regional Block:  Pectoralis block  Pre-Anesthetic Checklist: ,, timeout performed, Correct Patient, Correct Site, Correct Laterality, Correct Procedure, Correct Position, site marked, Risks and benefits discussed,  Surgical consent,  Pre-op evaluation,  At surgeon's request and post-op pain management  Laterality: Left  Prep: chloraprep       Needles:  Injection technique: Single-shot  Needle Type: Echogenic Needle     Needle Length: 9cm 9 cm     Additional Needles:  Procedures: ultrasound guided (picture in chart) Pectoralis block Narrative:  Start time: 07/24/2016 12:33 PM End time: 07/24/2016 12:40 PM Injection made incrementally with aspirations every 5 mL.  Performed by: Personally  Anesthesiologist: Glennon Mac, [place] Wingard  Additional Notes: Pt identified in Holding room.  Monitors applied. Working IV access confirmed. Sterile prep, drape L clavicle and pec.  #22ga ECHOgenic needle sub pec minor, between ribs 4,5 with US guidance.  30cc 0.5% Bupivacaine with 1:200k epi injected incrementally after negative test dose.  Patient asymptomatic, VSS, no heme aspirated, tolerated well.  Jenita Seashore, MD

## 2016-07-24 NOTE — Transfer of Care (Signed)
Immediate Anesthesia Transfer of Care Note  Patient: [name]  Procedure(s) Performed: Procedure(s): LEFT BREAST LUMPECTOMY WITH RADIOACTIVE SEED AND SENTINEL LYMPH NODE BIOPSY WITH BLUE DYE INJECTION (Left)  Patient Location: PACU  Anesthesia Type:General  Level of Consciousness: awake, patient cooperative and responds to stimulation  Airway & Oxygen Therapy: Patient Spontanous Breathing and Patient connected to nasal cannula oxygen  Post-op Assessment: Report given to RN and Post -op Vital signs reviewed and stable  Post vital signs: Reviewed and stable  Last Vitals:  Vitals:   07/24/16 1056 07/24/16 1426  BP: (!) 83/54 115/66  Pulse: 78 79  Resp: 20 15  Temp: 36.5 C 36.3 C    Last Pain:  Vitals:   07/24/16 1426  TempSrc:   PainSc: 2          Complications: No apparent anesthesia complications

## 2016-07-24 NOTE — Progress Notes (Signed)
AICD has been reprogrammed by [employer]. In PACU

## 2016-07-24 NOTE — Op Note (Signed)
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  Post op Diagnosis:    same  Procedure:                 Inject blue dye left breast, left breast lumpectomy with radioactive seed localization, left axillary deep sentinel lymph node biopsy  Surgeon:                     Edsel Petrin. Dalbert Batman, M.D., FACS  Assistant:                      OR staff   Indication for Assistant: N/A  Operative Indications:    This is a 60 year old Caucasian female with a significant number of comorbidities. She is referred by Dr. Emmit Pomfret at [facility] mammography for evaluation and management of a newly diagnosed breast cancer of the left breast, lower inner quadrant. Her PCP is Dr. Nelda Bucks in [place]. Her cardiologist is Dr. Sung Amabile. She is seen in the [facility] today by Dr. Lindi Adie, Dr. Sondra Come and me.     She has a significant past history of right breast cancer. She underwent right breast lumpectomy by Dr. Deon Pilling in 2005 and had adjuvant chemotherapy. She developed a recurrence in 2008 and had a mastectomy on the right and she states adjuvant chemotherapy again. Recent screening imaging studies show a 1.2 cm mass left breast, 7:00 position, 5 cm from the nipple. 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 We have discussed the pathology in conference this morning. This is known to be a potentially aggressive tumor in terms of systemic disease. Nodal metastasis are variable.      Comorbidities revealed that she has atrial fibrillation on eliquis. Has AICD and pacemaker in left infraclavicular area. History right breast cancer as above. Significant obesity. Obstructive sleep apnea. Asthma. COPD. Depression followed by psychiatrist. Hypertension. Congestive heart failure. History  laparoscopic cholecystectomy. Hospitalized this year for GI bleeding which was thought to be due to an ulcer in the small bowel which was cauterized. She also has a history of hidradenitis, axilla and groins This obviously complicates wound infection rates, and I discussed this with her.     Family history reveals 2 cousins and a maternal grandmother had breast cancer. There is uterine cancer in some family members. She is divorced. Lives in [place]. Quit smoking 2002. Lives alone. Has one child.      We had a long discussion about the options for surgical intervention, including mastectomy with or without reconstruction, lumpectomy, and sentinel lymph node biopsy. Her initial thought was to have a left mastectomy and bilateral reconstruction. I told her that I would be willing to refer her to a plastic surgeon for conversation about that, but I'm somewhat discouraged that due to her hip dread nidus and her significant comorbidities and her AICD. I told her that I thought that her wound infection rates and overall competitions would be higher with extensive surgery and I told her that my bias was to stick with more conservative approach which would include lumpectomy, sentinel lymph node biopsy, and adjuvant whole breast radiation therapy. High risk for infection due to hidradenitis, obesity, diabetes, and pulmonary disease. She has agreed  to the more conservative procedure.     She is referred immediately to Dr. Sung Amabile to get his opinion about cardiac risk factors, risk assessment, cardiac clearance, and management of the AICD ibn the different settings including lumpectomy, mastectomy, and mastectomy with reconstruction. She has been off of her eloquence for 3 days.  Her AICD was reprogrammed in the holding area and reprogrammed in PACU.    Operative Findings:       The patient underwent left pectoral block by Dr. Glennon Mac.  Patient underwent injection of technetium 99 by the  nuclear medicine technician in the holding area.  Identified the audible signal of the seed in the holding area.  The specimen mammogram looked good with the marker clip and seed relatively in the center.  I was slightly concerned about the inferior margin and so I re-excised the inferior margin.  I found 2 sentinel lymph nodes.  Procedure in Detail:          Following the induction of general endotracheal anesthesia the patient's left chest wall and axilla were prepped and draped in a sterile fashion.  Surgical timeout was performed.  Intravenous antibiotics were given.  5 mL of dilute methylene blue was injected into the left breast subareolar area and the breast massaged for a few minutes.  0.5% Marcaine with epinephrine was used as a local infiltration anesthetic into the skin and superficial subcutaneous tissue.     Using the neoprobe I dentified the radioactive signal in the left breast inferiorly at 7:00.  I chose to create a curvilinear transverse incision over the tumor.  Dissection was carried down into the breast tissue widely around the radioactive signal.  The specimen was removed and marked with silk sutures and a 6 color ink kit to orient the pathologist.  The specimen mammogram looked good.  I re-excised the inferior margin and inked it and sent it as a separate specimen.  The wound was irrigated.  Hemostasis was achieved electrocautery.  I placed 5 metal marker clips in the cardinal positions of the lumpectomy cavity.  I closed the lumpectomy breast tissue in 2 layers with interrupted 3-0 Vicryl and the skin with a running subcuticular 4-0 Monocryl and Dermabond.     Using the neoprobe on the technetium setting I planned and made a transverse incision in a skin crease just below the hairline.  Dissection was carried down through the subcutaneous tissue and the clavipectoral fascia was incised.  Using the neoprobe I found 2 sentinel lymph nodes, both hot and blue and slightly enlarged.  There was  no other radioactivity and the axillary dissection was completed.  This wound was irrigated.  Hemostasis was excellent.  The deeper tissues were closed with 3-0 Vicryl sutures and the skin closed with a running subcuticular 4-0 Monocryl and Dermabond.  Dry bandages and a breast binder were placed.  The patient tolerated the procedure well was taken to PACU in stable condition.  EBL 25 mL.  Counts correct.  Complications none.      Edsel Petrin. Dalbert Batman, M.D., FACS General and Minimally Invasive Surgery Breast and Colorectal Surgery  07/24/2016 2:18 PM

## 2016-07-24 NOTE — Anesthesia Preprocedure Evaluation (Signed)
Anesthesia Evaluation  Patient identified by MRN, date of birth, ID band Patient awake    Reviewed: Allergy & Precautions, NPO status , Patient's Chart, lab work & pertinent test results, reviewed documented beta blocker date and time   History of Anesthesia Complications Negative for: history of anesthetic complications  Airway Mallampati: II  TM Distance: >3 FB Neck ROM: Full    Dental  (+) Dental Advisory Given   Pulmonary sleep apnea and Continuous Positive Airway Pressure Ventilation , COPD,  COPD inhaler, former smoker,    breath sounds clear to auscultation       Cardiovascular hypertension, Pt. on medications and Pt. on home beta blockers +CHF  + dysrhythmias Atrial Fibrillation + pacemaker + Cardiac Defibrillator [facility], has never shocked )  Rhythm:Regular Rate:Normal  Cath 1/17 Very mild CAD, Mild to moderately increased filling pressures, Severe LV dysfunction due to NICM EF 20%   Neuro/Psych Anxiety Depression negative neurological ROS     GI/Hepatic Neg liver ROS, GERD  Medicated,  Endo/Other  diabetes, Insulin DependentMorbid obesity  Renal/GU negative Renal ROS     Musculoskeletal  (+) Arthritis ,   Abdominal (+) + obese,   Peds  Hematology Off of eliquis since sunday   Anesthesia Other Findings   Reproductive/Obstetrics                             Anesthesia Physical Anesthesia Plan  ASA: IV  Anesthesia Plan: General   Post-op Pain Management:    Induction: Intravenous  Airway Management Planned: Oral ETT  Additional Equipment:   Intra-op Plan:   Post-operative Plan: Extubation in OR  Informed Consent: I have reviewed the patients History and Physical, chart, labs and discussed the procedure including the risks, benefits and alternatives for the proposed anesthesia with the patient or authorized representative who has indicated his/her understanding and  acceptance.     Plan Discussed with: CRNA and Surgeon  Anesthesia Plan Comments: (Plan routine monitors, GETA with pectoralis block for post op analgesia)        Anesthesia Quick Evaluation

## 2016-07-24 NOTE — Telephone Encounter (Signed)
error 

## 2016-07-24 NOTE — Discharge Instructions (Signed)
[facility] °Office Phone Number [phone] ° °BREAST BIOPSY/ PARTIAL MASTECTOMY: POST OP INSTRUCTIONS ° °Always review your discharge instruction sheet given to you by the facility where your surgery was performed. ° °IF YOU HAVE DISABILITY OR FAMILY LEAVE FORMS, YOU MUST BRING THEM TO THE OFFICE FOR PROCESSING.  DO NOT GIVE THEM TO YOUR DOCTOR. ° °1. A prescription for pain medication may be given to you upon discharge.  Take your pain medication as prescribed, if needed.  If narcotic pain medicine is not needed, then you may take acetaminophen (Tylenol) or ibuprofen (Advil) as needed. °2. Take your usually prescribed medications unless otherwise directed °3. If you need a refill on your pain medication, please contact your pharmacy.  They will contact our office to request authorization.  Prescriptions will not be filled after 5pm or on week-ends. °4. You should eat very light the first 24 hours after surgery, such as soup, crackers, pudding, etc.  Resume your normal diet the day after surgery. °5. Most patients will experience some swelling and bruising in the breast.  Ice packs and a good support bra will help.  Swelling and bruising can take several days to resolve.  °6. It is common to experience some constipation if taking pain medication after surgery.  Increasing fluid intake and taking a stool softener will usually help or prevent this problem from occurring.  A mild laxative (Milk of Magnesia or Miralax) should be taken according to package directions if there are no bowel movements after 48 hours. °7. Unless discharge instructions indicate otherwise, you may remove your bandages 24-48 hours after surgery, and you may shower at that time.  You may have steri-strips (small skin tapes) in place directly over the incision.  These strips should be left on the skin for 7-10 days.  If your surgeon used skin glue on the incision, you may shower in 24 hours.  The glue will flake off over the  next 2-3 weeks.  Any sutures or staples will be removed at the office during your follow-up visit. °8. ACTIVITIES:  You may resume regular daily activities (gradually increasing) beginning the next day.  Wearing a good support bra or sports bra minimizes pain and swelling.  You may have sexual intercourse when it is comfortable. °a. You may drive when you no longer are taking prescription pain medication, you can comfortably wear a seatbelt, and you can safely maneuver your car and apply brakes. °b. RETURN TO WORK:  ______________________________________________________________________________________ °9. You should see your doctor in the office for a follow-up appointment approximately two weeks after your surgery.  Your doctor’s nurse will typically make your follow-up appointment when she calls you with your pathology report.  Expect your pathology report 2-3 business days after your surgery.  You may call to check if you do not hear from us after three days. °10. OTHER INSTRUCTIONS: _______________________________________________________________________________________________ _____________________________________________________________________________________________________________________________________ °_____________________________________________________________________________________________________________________________________ °_____________________________________________________________________________________________________________________________________ ° °WHEN TO CALL YOUR DOCTOR: °1. Fever over 101.0 °2. Nausea and/or vomiting. °3. Extreme swelling or bruising. °4. Continued bleeding from incision. °5. Increased pain, redness, or drainage from the incision. ° °The clinic staff is available to answer your questions during regular business hours.  Please don’t hesitate to call and ask to speak to one of the nurses for clinical concerns.  If you have a medical emergency, go to the nearest  emergency room or call 911.  A surgeon from [facility] is always on call at the hospital. ° °For further questions, please visit [email or web address]  °

## 2016-07-24 NOTE — Interval H&P Note (Signed)
History and Physical Interval Note:  07/24/2016 11:58 AM  [name]  has presented today for surgery, with the diagnosis of LEFT BREAST CANCER  The various methods of treatment have been discussed with the patient and family. After consideration of risks, benefits and other options for treatment, the patient has consented to  Procedure(s): LEFT BREAST LUMPECTOMY WITH RADIOACTIVE SEED AND SENTINEL LYMPH NODE BIOPSY WITH BLUE DYE INJECTION (Left) as a surgical intervention .  The patient's history has been reviewed, patient examined, no change in status, stable for surgery.  I have reviewed the patient's chart and labs.  Questions were answered to the patient's satisfaction.     Adin Hector

## 2016-07-25 ENCOUNTER — Encounter (HOSPITAL_COMMUNITY): Payer: Self-pay | Admitting: General Surgery

## 2016-07-25 NOTE — Anesthesia Postprocedure Evaluation (Signed)
Anesthesia Post Note  Patient: ANYAE RASE  Procedure(s) Performed: Procedure(s) (LRB): LEFT BREAST LUMPECTOMY WITH RADIOACTIVE SEED AND SENTINEL LYMPH NODE BIOPSY WITH BLUE DYE INJECTION (Left)  Patient location during evaluation: PACU Anesthesia Type: General and Regional Level of consciousness: awake and alert, oriented and patient cooperative Pain management: pain level controlled Vital Signs Assessment: post-procedure vital signs reviewed and stable Respiratory status: spontaneous breathing, nonlabored ventilation, respiratory function stable and patient connected to nasal cannula oxygen Cardiovascular status: blood pressure returned to baseline and stable Postop Assessment: no signs of nausea or vomiting Anesthetic complications: no       Last Vitals:  Vitals:   07/24/16 1445 07/24/16 1510  BP:  114/68  Pulse: 77 81  Resp: 13 16  Temp:      Last Pain:  Vitals:   07/24/16 1510  TempSrc:   PainSc: 2                  Kimerly Rowand,E. Bellarae Lizer

## 2016-07-26 NOTE — Progress Notes (Signed)
Inform patient of Pathology report,. Be sure called TODAY. Close but negative margin. Negative nodesd. Do not plan further surgery  hmi

## 2016-07-30 ENCOUNTER — Other Ambulatory Visit (HOSPITAL_COMMUNITY): Payer: Self-pay | Admitting: *Deleted

## 2016-07-30 DIAGNOSIS — C50919 Malignant neoplasm of unspecified site of unspecified female breast: Secondary | ICD-10-CM | POA: Diagnosis not present

## 2016-07-30 DIAGNOSIS — E876 Hypokalemia: Secondary | ICD-10-CM | POA: Diagnosis not present

## 2016-07-30 DIAGNOSIS — K625 Hemorrhage of anus and rectum: Secondary | ICD-10-CM | POA: Diagnosis not present

## 2016-07-30 MED ORDER — METOLAZONE 2.5 MG PO TABS: 2.5000 mg | ORAL_TABLET | ORAL | 3 refills | Status: DC | PRN

## 2016-08-05 NOTE — Assessment & Plan Note (Signed)
Left Lumpectomy 07/24/16: Spindle cell neoplasm 1.6 cm with ADH (less than 0.1 cm to ant margin) 0/2 LN Neg, Grade 3, Triple Neg, Grade 3; T1cN0 (Stage 1 A)  Pathology counseling: I discussed the final pathology report of the patient provided  a copy of this report. I discussed the margins as well as lymph node surgeries. We also discussed the final staging along with previously performed ER/PR and HER-2/neu testing.  Plan: Adjuvant XRT foll by Observation

## 2016-08-06 ENCOUNTER — Other Ambulatory Visit: Payer: Medicare Other

## 2016-08-06 ENCOUNTER — Encounter: Payer: Self-pay | Admitting: Genetic Counselor

## 2016-08-06 ENCOUNTER — Ambulatory Visit (HOSPITAL_BASED_OUTPATIENT_CLINIC_OR_DEPARTMENT_OTHER): Payer: Medicare Other | Admitting: Genetic Counselor

## 2016-08-06 ENCOUNTER — Ambulatory Visit (HOSPITAL_BASED_OUTPATIENT_CLINIC_OR_DEPARTMENT_OTHER): Payer: Medicare Other | Admitting: Hematology and Oncology

## 2016-08-06 ENCOUNTER — Encounter: Payer: Self-pay | Admitting: Hematology and Oncology

## 2016-08-06 DIAGNOSIS — Z7183 Encounter for nonprocreative genetic counseling: Secondary | ICD-10-CM | POA: Diagnosis not present

## 2016-08-06 DIAGNOSIS — Z800 Family history of malignant neoplasm of digestive organs: Secondary | ICD-10-CM

## 2016-08-06 DIAGNOSIS — D1330 Benign neoplasm of unspecified part of small intestine: Secondary | ICD-10-CM

## 2016-08-06 DIAGNOSIS — Z803 Family history of malignant neoplasm of breast: Secondary | ICD-10-CM

## 2016-08-06 DIAGNOSIS — C50511 Malignant neoplasm of lower-outer quadrant of right female breast: Secondary | ICD-10-CM | POA: Diagnosis not present

## 2016-08-06 DIAGNOSIS — Z171 Estrogen receptor negative status [ER-]: Secondary | ICD-10-CM | POA: Diagnosis not present

## 2016-08-06 DIAGNOSIS — C50312 Malignant neoplasm of lower-inner quadrant of left female breast: Secondary | ICD-10-CM

## 2016-08-06 DIAGNOSIS — Z8049 Family history of malignant neoplasm of other genital organs: Secondary | ICD-10-CM

## 2016-08-06 DIAGNOSIS — K6389 Other specified diseases of intestine: Secondary | ICD-10-CM

## 2016-08-06 NOTE — Progress Notes (Signed)
REFERRING PROVIDER: Nicoletta Dress, MD [place],  [id number]   Nicholas Lose, MD  PRIMARY PROVIDER:  Nicoletta Dress, MD  PRIMARY REASON FOR VISIT:  1. Malignant neoplasm of lower-outer quadrant of right breast of female, estrogen receptor negative ([place])   2. Family history of breast cancer   3. Family history of uterine cancer   4. Family history of colon cancer   5. Malignant neoplasm of lower-inner quadrant of left breast in female, estrogen receptor negative ([place])   6. Small bowel polyp      HISTORY OF PRESENT ILLNESS:   Ms. Feuerborn, a 61 y.o. female, was seen for a [facility] cancer genetics consultation at the request of Dr. Lindi Adie due to a personal and family history of cancer.  Ms. Plantz presents to clinic today to discuss the possibility of a hereditary predisposition to cancer, genetic testing, and to further clarify her future cancer risks, as well as potential cancer risks for family members.   In 2005, at the age of 22, Ms. Laning was diagnosed with cancer of the right breast. This was treated with lumpectomy, chemotherapy and radiation. In 2008, she was again dx with breast cancer in the right breast.  This was treated with mastectomy and radiation.  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  She has not had genetic testing in the past.     CANCER HISTORY:    Breast cancer of lower-outer quadrant of right female breast ([place])   06/13/2004 Surgery    Right breast lumpectomy: Invasive ductal carcinoma, 1.7 cm, grade 3 with high-grade DCIS, 3 lymph nodes negative      07/16/2004 - 10/15/2004 Chemotherapy    Adjuvant chemotherapy with FEC. 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      10/2004 - 11/2004 Radiation Therapy    Adjuvant RT ([facility])      08/12/2006 Relapse/Recurrence    Relapsed disease in the right breast      08/27/2006 Surgery    Right breast  mastectomy: Tumor size 0.4 cm margins negative, ER 0%, PR 0%, HER-2 negative, Ki-67 13%      08/27/2006 Pathologic Stage    Stage IA: pT1a pNx      07/24/2016 Surgery    Left Lumpectomy: Spindle cell neoplasm 1.6 cm with ADH (less than 0.1 cm to ant margin) 0/2 LN Neg, Grade 3, Triple Neg, Grade 3       Malignant neoplasm of lower-inner quadrant of left female breast ([place])   06/24/2016 Initial Diagnosis    Left breast bx 7:00: Malignant spindle cell carcinoma, ADH, pos for CK 8/18, CK 903, CK 5/6, CK AE1/AE3, neg for smooth muscle actin, CD34, desmin, calponin, E-cadherin, ER 0%, PR 0%, Ki-67 20%, HER-2 neg ratio 1.21; 1.2 cm, T1 CN 0 stage IA        HORMONAL RISK FACTORS:  Menarche was at age 4.  First live birth at age 64.  OCP use for approximately 9 years.  Ovaries intact: yes.  Hysterectomy: yes.  Menopausal status: postmenopausal.  HRT use: 0 years. Colonoscopy: yes; reports at least 15 colon polyps. Mammogram within the last year: yes. Number of breast biopsies: 6. Up to date with pelvic exams:  yes. Any excessive radiation exposure in the past:  Breast cancer treatment  Past Medical History:  Diagnosis Date  . AICD (automatic cardioverter/defibrillator) present    st jude  . Anxiety   .  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 Asthma   . CHF (congestive heart failure) ([place])   . COPD (chronic obstructive pulmonary disease) ([place])   . Depression    followed by a psychiatrist  . DYSLIPIDEMIA 05/01/2009  . Dysrhythmia    atrial fibrillation  . Family history of breast cancer   . Family history of colon cancer   . Family history of uterine cancer   . Gastroparesis   . GERD (gastroesophageal reflux disease)   . Headache   . History of breast cancer 2006   with return in 2008, s/p mastectomy, XRT and chemotherapy  . Hypertension   . 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 IBS (irritable bowel syndrome)   . ICD (implantable cardiac defibrillator) in place 11/10/2012   ICD (11/2012)  .  Malignant neoplasm of lower-inner quadrant of left female breast ([place]) 06/26/2016  . Myocardial infarction   . OBESITY 07/24/2007  . OBSTRUCTIVE SLEEP APNEA 07/24/2007   with CPAP compliance  . Osteoarthritis   . Persistent atrial fibrillation ([place]) 09/2014  . Presence of permanent cardiac pacemaker   . RESTLESS LEG SYNDROME 07/24/2007  . SYSTOLIC HEART FAILURE, CHRONIC 01/05/2009   a. chemo induced cardiomyopathy b. cMRI (02/2009) EF 45% c. Myoview 05/2010: EF 59%, no ischemia d. RHC (01/2012) RA 20, RV 55/13/22, PA 56/34 (44), PCWP 28, Fick CO/CI: 3.1 / 1.5, PVR 5.3 WU, PA sat 42% & 48% e. ECHO (10/2012) EF 20-25%, diff HK, MV calcified annulus f. ECHO (03/2014) EF 30-35%, sev HK, inferior/inferoseptal walls, mild MR    Past Surgical History:  Procedure Laterality Date  . ABDOMINAL HYSTERECTOMY  1991  . BI-VENTRICULAR IMPLANTABLE CARDIOVERTER DEFIBRILLATOR N/A 11/10/2012   SJM Forify Assura single chamber ICD  . BREAST LUMPECTOMY WITH RADIOACTIVE SEED AND SENTINEL LYMPH NODE BIOPSY Left 07/24/2016   Procedure: LEFT BREAST LUMPECTOMY WITH RADIOACTIVE SEED AND SENTINEL LYMPH NODE BIOPSY WITH BLUE DYE INJECTION;  Surgeon: Fanny Skates, MD;  Location: [facility];  Service: General;  Laterality: Left;  . CARDIAC CATHETERIZATION N/A 08/21/2015   Procedure: Right/Left Heart Cath and Coronary Angiography;  Surgeon: Jolaine Artist, MD;  Location: [facility];  Service: Cardiovascular;  Laterality: N/A;  . CARDIAC DEFIBRILLATOR PLACEMENT  11/10/2012   SJM Fortify Assura VR implanted by Dr Rayann Heman for primary prevention  . CHOLECYSTECTOMY    . COLONOSCOPY N/A 11/17/2015   Procedure: COLONOSCOPY;  Surgeon: Jerene Bears, MD;  [facility];  Service: Endoscopy;  Laterality: N/A;  . ENTEROSCOPY N/A 03/26/2016   Procedure: ENTEROSCOPY;  Surgeon: Manus Gunning, MD;  Location: [facility];  Service: Gastroenterology;  Laterality: N/A;  . ESOPHAGOGASTRODUODENOSCOPY N/A 11/16/2015    Procedure: ESOPHAGOGASTRODUODENOSCOPY (EGD);  Surgeon: Manus Gunning, MD;  Location: [facility];  Service: Gastroenterology;  Laterality: N/A;  . ESOPHAGOGASTRODUODENOSCOPY N/A 02/24/2016   Procedure: ESOPHAGOGASTRODUODENOSCOPY (EGD);  Surgeon: Milus Banister, MD;  Location: [facility];  Service: Endoscopy;  Laterality: N/A;  . ESOPHAGOGASTRODUODENOSCOPY N/A 03/26/2016   Procedure: ESOPHAGOGASTRODUODENOSCOPY (EGD);  Surgeon: Manus Gunning, MD;  Location: [facility];  Service: Gastroenterology;  Laterality: N/A;  . EYE SURGERY     cataracts  . GIVENS CAPSULE STUDY N/A 12/11/2015   Procedure: GIVENS CAPSULE STUDY;  Surgeon: Manus Gunning, MD;  Location: [facility];  Service: Gastroenterology;  Laterality: N/A;  . KNEE CARTILAGE SURGERY    . MASTECTOMY     right  . WRIST SURGERY Bilateral     Social History   Social History  . Marital status: Divorced  Spouse name: N/A  . Number of children: N/A  . Years of education: N/A   Occupational History  . Disabled Unemployed   Social History Main Topics  . Smoking status: Former Smoker    Quit date: 04/12/2001  . Smokeless tobacco: Never Used  . Alcohol use No  . Drug use: No  . Sexual activity: Not Currently   Other Topics Concern  . None   Social History Narrative   Lives with mother and brother Philis Nettle           FAMILY HISTORY:  We obtained a detailed, 4-generation family history.  Significant diagnoses are listed below: Family History  Problem Relation Age of Onset  . Diabetes Mellitus II Mother   . Lung cancer Mother   . Congestive Heart Failure Mother   . Coronary artery disease Father     CABG x 3  . Hypertension Father   . Diabetes Mellitus II Father   . Breast cancer Cousin 71    maternal first cousin  . Colon cancer Cousin 48    maternal first cousin  . Uterine cancer Cousin     dx in her 106s  . Colon cancer Maternal Aunt     dx in her late 42s  . Uterine cancer  Maternal Aunt 66  . Lung cancer Maternal Aunt   . Uterine cancer Maternal Aunt 38  . Colon cancer Maternal Aunt 14  . Breast cancer Maternal Grandmother 77  . Uterine cancer Maternal Aunt 48  . Uterine cancer Cousin     dx in her 42s  . Uterine cancer Cousin 85    The patient has one daughter who is cancer free.  She has two maternal half brothers who are cancer free.  Her mother died of lung cancer at age 27. Her mother had six sisters, one full brother and a maternal half brother.  One sister had uterine cancer at 73, a second sister had colon cancer at 89 and had a daughter with breast cancer at 34. A third sister had uterine cancer at 93, a fourth sister had uterine cancer at 72 and colon cancer two years later.  She had two daughters with uterine cancer.  One brother had a son with colon cancer at 60 and a daughter with uterine cancer at 50.  The patient's maternal grandmother had breast cancer at 48.  The patient does not have any information about her biological father.  Ms. Fryman is unaware of previous family history of genetic testing for hereditary cancer risks. Patient's maternal ancestors are of [place], [place] and [place] descent, and paternal ancestors are of Caucasian descent. There is no reported Ashkenazi Jewish ancestry. There is no known consanguinity.  GENETIC COUNSELING ASSESSMENT: AKIMA SLAUGH is a 61 y.o. female with a personal history of breast cancer and family history of uterine, breast and colon cancer which is somewhat suggestive of a hereditary cancer syndrome and predisposition to cancer. We, therefore, discussed and recommended the following at today's visit.   DISCUSSION: We discussed that about 5-10% of breast cancer is hereditary with most cases due to BRCA mutations.  Other genes associated with hereditary breast cancer include ATM, CHEK2 and PALB2.  Based on her family history we are also concerned about Lynch syndrome and PTEN.  We reviewed the  characteristics, features and inheritance patterns of hereditary cancer syndromes. We also discussed genetic testing, including the appropriate family members to test, the process of testing, insurance coverage and turn-around-time for results.  We discussed the implications of a negative, positive and/or variant of uncertain significant result. We recommended Ms. Butkiewicz pursue genetic testing for the Common hereditary cancer gene panel. The Hereditary Gene Panel offered by [facility] includes sequencing and/or deletion duplication testing of the following 43 genes: APC, ATM, AXIN2, BARD1, BMPR1A, BRCA1, BRCA2, BRIP1, CDH1, CDKN2A (p14ARF), CDKN2A (p16INK4a), CHEK2, DICER1, EPCAM (Deletion/duplication testing only), GREM1 (promoter region deletion/duplication testing only), KIT, MEN1, MLH1, MSH2, MSH6, MUTYH, NBN, NF1, PALB2, PDGFRA, PMS2, POLD1, POLE, PTEN, RAD50, RAD51C, RAD51D, SDHB, SDHC, SDHD, SMAD4, SMARCA4. STK11, TP53, TSC1, TSC2, and VHL.  The following gene was evaluated for sequence changes only: SDHA and HOXB13 c.251G>A variant only.   Based on Ms. Hamberger's personal and family history of cancer, she meets medical criteria for genetic testing. Despite that she meets criteria, she may still have an out of pocket cost. We discussed that if her out of pocket cost for testing is over $100, the laboratory will call and confirm whether she wants to proceed with testing.  If the out of pocket cost of testing is less than $100 she will be billed by the genetic testing laboratory.   PLAN: After considering the risks, benefits, and limitations, Ms. Zemanek  provided informed consent to pursue genetic testing and the blood sample was sent to [facility] for analysis of the Common Hereditary Cancer Panel. Results should be available within approximately 2-3 weeks' time, at which point they will be disclosed by telephone to Ms. Madewell, as will any additional recommendations warranted by these results.  Ms. Amundson will receive a summary of her genetic counseling visit and a copy of her results once available. This information will also be available in Epic. We encouraged Ms. Ken to remain in contact with cancer genetics annually so that we can continuously update the family history and inform her of any changes in cancer genetics and testing that may be of benefit for her family. Ms. Scotto questions were answered to her satisfaction today. Our contact information was provided should additional questions or concerns arise.  Lastly, we encouraged Ms. Winkles to remain in contact with cancer genetics annually so that we can continuously update the family history and inform her of any changes in cancer genetics and testing that may be of benefit for this family.   Ms.  Moss questions were answered to her satisfaction today. Our contact information was provided should additional questions or concerns arise. Thank you for the referral and allowing [place] to share in the care of your patient.   Karmella Bouvier P. Florene Glen, [place], [facility] Certified Genetic Counselor [email or web address] phone: [phone]  The patient was seen for a total of 45 minutes in face-to-face genetic counseling.  This patient was discussed with Drs. Magrinat, Lindi Adie and/or Burr Medico who agrees with the above.    _______________________________________________________________________ For Office Staff:  Number of people involved in session: 2 Was an Intern/ student involved with case: no

## 2016-08-06 NOTE — Progress Notes (Signed)
Patient Care Team: Nicoletta Dress, MD as PCP - General (Internal Medicine) Verlin Grills, RN as [place] Management  DIAGNOSIS:  Encounter Diagnosis  Name Primary?  . Malignant neoplasm of lower-outer quadrant of right breast of female, estrogen receptor negative ([place])     SUMMARY OF ONCOLOGIC HISTORY:   Breast cancer of lower-outer quadrant of right female breast ([place])   06/13/2004 Surgery    Right breast lumpectomy: Invasive ductal carcinoma, 1.7 cm, grade 3 with high-grade DCIS, 3 lymph nodes negative      07/16/2004 - 10/15/2004 Chemotherapy    Adjuvant chemotherapy with FEC. 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      10/2004 - 11/2004 Radiation Therapy    Adjuvant RT ([facility])      08/12/2006 Relapse/Recurrence    Relapsed disease in the right breast      08/27/2006 Surgery    Right breast mastectomy: Tumor size 0.4 cm margins negative, ER 0%, PR 0%, HER-2 negative, Ki-67 13%      08/27/2006 Pathologic Stage    Stage IA: pT1a pNx      07/24/2016 Surgery    Left Lumpectomy: Spindle cell neoplasm 1.6 cm with ADH (less than 0.1 cm to ant margin) 0/2 LN Neg, Grade 3, Triple Neg, Grade 3       Malignant neoplasm of lower-inner quadrant of left female breast ([place])   06/24/2016 Initial Diagnosis    Left breast bx 7:00: Malignant spindle cell carcinoma, ADH, pos for CK 8/18, CK 903, CK 5/6, CK AE1/AE3, neg for smooth muscle actin, CD34, desmin, calponin, E-cadherin, ER 0%, PR 0%, Ki-67 20%, HER-2 neg ratio 1.21; 1.2 cm, T1 CN 0 stage IA       CHIEF COMPLIANT: Follow-up after recent surgery INTERVAL HISTORY: [name] is a 61 year old underwent lumpectomy for 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 She is healing very well from the surgery. She is here to discuss the pathology report.  REVIEW OF SYSTEMS:   Constitutional: Denies fevers, chills or abnormal weight loss Eyes: Denies blurriness of vision Ears, nose, mouth, throat, and face:  Denies mucositis or sore throat Respiratory: Denies cough, dyspnea or wheezes Cardiovascular: Denies palpitation, chest discomfort Gastrointestinal:  Denies nausea, heartburn or change in bowel habits Skin: Denies abnormal skin rashes Lymphatics: Denies new lymphadenopathy or easy bruising Neurological:Denies numbness, tingling or new weaknesses Behavioral/Psych: Mood is stable, no new changes  Extremities: No lower extremity edema Breast:  Left lumpectomy All other systems were reviewed with the patient and are negative.  I have reviewed the past medical history, past surgical history, social history and family history with the patient and they are unchanged from previous note.  ALLERGIES:  is allergic to bee venom; aspirin; ciprofloxacin; codeine; naproxen; other; sulfonamide derivatives; xarelto [rivaroxaban]; atorvastatin; nsaids; tape; and tresiba flextouch [insulin degludec].  MEDICATIONS:  Current Outpatient Prescriptions  Medication Sig Dispense Refill  . acetaminophen (TYLENOL) 650 MG CR tablet Take 1,300 mg by mouth every 8 (eight) hours as needed for pain.    Marland Kitchen albuterol (PROVENTIL HFA) 108 (90 BASE) MCG/ACT inhaler Inhale 2 puffs into the lungs every 6 (six) hours as needed. For shortness of breath    . ALPRAZolam (XANAX) 0.5 MG tablet Take 0.25 mg by mouth 4 (four) times daily as needed for anxiety.     Marland Kitchen amiodarone (PACERONE) 200 MG tablet Take 1 tablet (200 mg total) by mouth daily. 90 tablet 3  . apixaban (ELIQUIS) 5 MG TABS tablet Take 1  tablet (5 mg total) by mouth 2 (two) times daily. 180 tablet 3  . baclofen (LIORESAL) 20 MG tablet Take 20 mg by mouth 2 (two) times daily as needed for muscle spasms.    . busPIRone (BUSPAR) 30 MG tablet Take 30 mg by mouth 2 (two) times daily.     . carvedilol (COREG) 6.25 MG tablet Take 1 tablet (6.25 mg total) by mouth 2 (two) times daily with a meal. 180 tablet 3  . digoxin (LANOXIN) 0.125 MG tablet Take 1 tablet (0.125 mg total) by  mouth every other day. 45 tablet 3  . docusate sodium (COLACE) 100 MG capsule Take 3 capsules (300 mg total) by mouth at bedtime. 10 capsule 0  . DULoxetine (CYMBALTA) 30 MG capsule Take 30 mg by mouth daily.    . furosemide (LASIX) 20 MG tablet Take 3 tablets (60 mg total) by mouth 2 (two) times daily. If 1 to 2 pound weight gain in a day, take extra 20 mg dose 540 tablet 0  . Garlic 206 MG CAPS Take 500 mg by mouth daily after lunch.     Marland Kitchen HUMALOG KWIKPEN 100 UNIT/ML KiwkPen Inject 14-24 Units into the skin 3 (three) times daily. On average per patient 24 unit with breakfast, 16 units with lunch, & 14-16 units with supper    . HUMALOG MIX 75/25 KWIKPEN (75-25) 100 UNIT/ML Kwikpen Inject 75 Units into the skin 2 (two) times daily.    Marland Kitchen HYDROcodone-acetaminophen (NORCO) 5-325 MG tablet Take 1-2 tablets by mouth every 6 (six) hours as needed for moderate pain or severe pain. 30 tablet 0  . hydrOXYzine (ATARAX/VISTARIL) 50 MG tablet Take 50 mg by mouth every 6 (six) hours as needed for itching ((primarily taken at bedtime)).     . ivabradine (CORLANOR) 5 MG TABS tablet Take 1 tablet (5 mg total) by mouth 2 (two) times daily with a meal. 180 tablet 3  . levothyroxine (SYNTHROID, LEVOTHROID) 75 MCG tablet Take 75 mcg by mouth daily before breakfast.     . meclizine (ANTIVERT) 12.5 MG tablet Take 1 tablet (12.5 mg total) by mouth 3 (three) times daily as needed for dizziness. 20 tablet 0  . Melatonin 1 MG TABS Take 0.5 mg by mouth at bedtime.    . metolazone (ZAROXOLYN) 2.5 MG tablet Take 1 tablet (2.5 mg total) by mouth as needed (for weight gain >3 lbs). 45 tablet 3  . mometasone (NASONEX) 50 MCG/ACT nasal spray Place 1 spray into both nostrils daily. allergies    . Multiple Minerals (CALCIUM-MAGNESIUM-ZINC) TABS Take 1 tablet by mouth daily after lunch.     . nitroGLYCERIN (NITROSTAT) 0.4 MG SL tablet Place 1 tablet (0.4 mg total) under the tongue every 5 (five) minutes as needed. For chest pain 25  tablet 3  . Omega-3 Fatty Acids (FISH OIL) 1000 MG CAPS Take 2 capsules by mouth daily.    . pantoprazole (PROTONIX) 40 MG tablet Take 40 mg by mouth 2 (two) times daily.      . polyethylene glycol powder (GLYCOLAX/MIRALAX) powder Mix 17 grams in 8 oz of water twice daily 850 g 3  . potassium chloride SA (K-DUR,KLOR-CON) 20 MEQ tablet Take 3 tablets (60 mEq total) by mouth 3 (three) times daily. (Patient taking differently: Take 60 mEq by mouth 2 (two) times daily. 3 tablet (60 MEQ) TWICE DAILY SCHEDULED AND IF PATIENT TAKES ADDITIONAL LASIX TAKES ADDITIONAL 20 MEQ-- IF ADDT'L LASIX AND METALAZONE PATIENT TAKES ADDITIONAL 40 MEQ.) 540 tablet  3  . Propylene Glycol (SYSTANE BALANCE) 0.6 % SOLN Place 1 drop into both eyes 3 (three) times daily.    Marland Kitchen pyridOXINE (VITAMIN B-6) 100 MG tablet Take 100 mg by mouth daily after lunch.     . ramipril (ALTACE) 2.5 MG capsule Take 1 capsule (2.5 mg total) by mouth daily. (Patient taking differently: Take 2.5 mg by mouth at bedtime. ) 90 capsule 3  . rOPINIRole (REQUIP) 1 MG tablet Take 1 mg by mouth at bedtime and may repeat dose one time if needed. IF RESTLESS LEGS PERSIST PATIENT MAY REPEAT ADDITIONAL DOSE    . rosuvastatin (CRESTOR) 10 MG tablet Take 10 mg by mouth at bedtime.    . Simethicone Extra Strength 125 MG CAPS Take 1 tablet by mouth daily as needed (for bloating). Reported on 02/23/2016    . spironolactone (ALDACTONE) 25 MG tablet Take 1 tablet (25 mg total) by mouth daily. 90 tablet 3  . SUMAtriptan (IMITREX) 100 MG tablet Take 100 mg by mouth daily as needed for migraine.     . tiotropium (SPIRIVA) 18 MCG inhalation capsule Place 18 mcg into inhaler and inhale daily.      . vitamin B-12 (CYANOCOBALAMIN) 1000 MCG tablet Take 1,000 mcg by mouth daily after lunch.     . vitamin C (ASCORBIC ACID) 500 MG tablet Take 500 mg by mouth daily after lunch.     . vitamin E 400 UNIT capsule Take 400 Units by mouth daily after lunch.      No current  facility-administered medications for this visit.     PHYSICAL EXAMINATION: ECOG PERFORMANCE STATUS: 1 - Symptomatic but completely ambulatory  Vitals:   08/06/16 1530  BP: (!) 101/53  Pulse: 86  Resp: 18  Temp: 97.4 F (36.3 C)   Filed Weights   08/06/16 1530  Weight: 219 lb 1.6 oz (99.4 kg)    GENERAL:alert, no distress and comfortable SKIN: skin color, texture, turgor are normal, no rashes or significant lesions EYES: normal, Conjunctiva are pink and non-injected, sclera clear OROPHARYNX:no exudate, no erythema and lips, buccal mucosa, and tongue normal  NECK: supple, thyroid normal size, non-tender, without nodularity LYMPH:  no palpable lymphadenopathy in the cervical, axillary or inguinal LUNGS: clear to auscultation and percussion with normal breathing effort HEART: regular rate & rhythm and no murmurs and no lower extremity edema ABDOMEN:abdomen soft, non-tender and normal bowel sounds MUSCULOSKELETAL:no cyanosis of digits and no clubbing  NEURO: alert & oriented x 3 with fluent speech, no focal motor/sensory deficits EXTREMITIES: No lower extremity edema  LABORATORY DATA:  I have reviewed the data as listed   Chemistry      Component Value Date/Time   NA 136 07/23/2016 1200   NA 135 (L) 07/03/2016 1201   K 3.3 (L) 07/23/2016 1200   K 3.8 07/03/2016 1201   CL 100 (L) 07/23/2016 1200   CL 104 09/10/2014 1753   CO2 28 07/23/2016 1200   CO2 30 (H) 07/03/2016 1201   BUN 14 07/23/2016 1200   BUN 23.2 07/03/2016 1201   CREATININE 0.88 07/23/2016 1200   CREATININE 1.1 07/03/2016 1201      Component Value Date/Time   CALCIUM 9.2 07/23/2016 1200   CALCIUM 10.7 (H) 07/03/2016 1201   ALKPHOS 62 07/23/2016 1200   ALKPHOS 83 07/03/2016 1201   AST 27 07/23/2016 1200   AST 16 07/03/2016 1201   ALT 16 07/23/2016 1200   ALT 15 07/03/2016 1201   BILITOT 1.3 (H) 07/23/2016 1200  BILITOT 0.95 07/03/2016 1201       Lab Results  Component Value Date   WBC 8.3  07/23/2016   HGB 11.0 (L) 07/23/2016   HCT 34.3 (L) 07/23/2016   MCV 84.1 07/23/2016   PLT 274 07/23/2016   NEUTROABS 6.1 07/23/2016    ASSESSMENT & PLAN:  Breast cancer of lower-outer quadrant of right female breast Left Lumpectomy 07/24/16: Spindle cell neoplasm 1.6 cm with ADH (less than 0.1 cm to ant margin) 0/2 LN Neg, Grade 3, Triple Neg, Grade 3; T1cN0 (Stage 1 A)  Pathology counseling: I discussed the final pathology report of the patient provided  a copy of this report. I discussed the margins as well as lymph node surgeries. We also discussed the final staging along with previously performed ER/PR and HER-2/neu testing.  Plan: Adjuvant XRT foll by Observation  Return to clinic in 6 months for follow-up  No orders of the defined types were placed in this encounter.  The patient has a good understanding of the overall plan. she agrees with it. she will call with any problems that may develop before the next visit here.   Rulon Eisenmenger, MD 08/06/16

## 2016-08-08 ENCOUNTER — Ambulatory Visit
Admission: RE | Admit: 2016-08-08 | Discharge: 2016-08-08 | Disposition: A | Discharge status: Home / Self Care | Payer: Medicare Other | Source: Ambulatory Visit | Attending: Radiation Oncology | Admitting: Radiation Oncology

## 2016-08-08 ENCOUNTER — Other Ambulatory Visit (HOSPITAL_COMMUNITY): Payer: Self-pay | Admitting: *Deleted

## 2016-08-08 ENCOUNTER — Encounter: Payer: Self-pay | Admitting: Radiation Oncology

## 2016-08-08 DIAGNOSIS — R51 Headache: Secondary | ICD-10-CM | POA: Diagnosis not present

## 2016-08-08 DIAGNOSIS — R42 Dizziness and giddiness: Secondary | ICD-10-CM | POA: Insufficient documentation

## 2016-08-08 DIAGNOSIS — Z510 Encounter for antineoplastic radiation therapy: Secondary | ICD-10-CM | POA: Insufficient documentation

## 2016-08-08 DIAGNOSIS — C50312 Malignant neoplasm of lower-inner quadrant of left female breast: Secondary | ICD-10-CM

## 2016-08-08 DIAGNOSIS — Z171 Estrogen receptor negative status [ER-]: Secondary | ICD-10-CM | POA: Diagnosis not present

## 2016-08-08 DIAGNOSIS — Z9889 Other specified postprocedural states: Secondary | ICD-10-CM | POA: Diagnosis not present

## 2016-08-08 DIAGNOSIS — F419 Anxiety disorder, unspecified: Secondary | ICD-10-CM | POA: Insufficient documentation

## 2016-08-08 DIAGNOSIS — Z950 Presence of cardiac pacemaker: Secondary | ICD-10-CM | POA: Diagnosis not present

## 2016-08-08 DIAGNOSIS — I509 Heart failure, unspecified: Secondary | ICD-10-CM

## 2016-08-08 LAB — CUP PACEART REMOTE DEVICE CHECK
HighPow Impedance: 80 Ohm
HighPow Impedance: 80 Ohm
Implantable Lead Location: 753860
Implantable Pulse Generator Implant Date: 20140408
Lead Channel Setting Pacing Amplitude: 2.5 V
Lead Channel Setting Pacing Pulse Width: 0.5 ms
Lead Channel Setting Sensing Sensitivity: 0.5 mV
MDC IDC LEAD IMPLANT DT: 20140408
MDC IDC MSMT BATTERY REMAINING LONGEVITY: 71 mo
MDC IDC MSMT BATTERY REMAINING PERCENTAGE: 66 %
MDC IDC MSMT BATTERY VOLTAGE: 2.96 V
MDC IDC MSMT LEADCHNL RV IMPEDANCE VALUE: 430 Ohm
MDC IDC MSMT LEADCHNL RV PACING THRESHOLD AMPLITUDE: 0.75 V
MDC IDC MSMT LEADCHNL RV PACING THRESHOLD PULSEWIDTH: 0.5 ms
MDC IDC MSMT LEADCHNL RV SENSING INTR AMPL: 12 mV
MDC IDC SESS DTM: 20171201201848
MDC IDC STAT BRADY RV PERCENT PACED: 1 %
Pulse Gen Serial Number: 7066421

## 2016-08-08 MED ORDER — POTASSIUM CHLORIDE CRYS ER 20 MEQ PO TBCR: 60.0000 meq | EXTENDED_RELEASE_TABLET | Freq: Three times a day (TID) | ORAL | 3 refills | Status: DC

## 2016-08-08 NOTE — Progress Notes (Signed)
Location of Breast Cancer: left breast - lower inner quadrant   Histology per Pathology Report:   07/24/16 Diagnosis 1. 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 - ATYPICAL DUCTAL HYPERPLASIA. - TUMOR FOCALLY LESS THAN 0.1 CM FROM ANTERIOR MARGIN. - FIBROCYSTIC CHANGES WITH FOCAL USUAL DUCTAL HYPERPLASIA. 2. Lymph node, sentinel, biopsy, Left Axillary #1 - ONE BENIGN LYMPH NODE (0/1). 3. Breast, excision, Left Inferior Margin - BENIGN FIBROCYSTIC CHANGES. - NO EVIDENCE OF MALIGNANCY. - FINAL INFERIOR MARGIN CLEAR. 4. Lymph node, sentinel, biopsy, Left Axillary #2 - ONE BENIGN LYMPH NODE (0/1).  Receptor Status: ER(0), PR (0), Her2-neu (negative), Ki-(20%)  Did patient present with symptoms (if so, please note symptoms) or was this found on screening mammography?: screening mammogram  Past/Anticipated interventions by surgeon, if PUG:[id number]: 07/24/16 - LEFT BREAST LUMPECTOMY WITH RADIOACTIVE SEED AND SENTINEL LYMPH NODE BIOPSY WITH BLUE DYE INJECTION;  Surgeon: Fanny Skates, MD    Past/Anticipated interventions by medical oncology, if any: no  Lymphedema issues, if any:  no    Pain issues, if any:  no   SAFETY ISSUES:  Prior radiation? 12/18/04 - 02/14/05 right breast 5040 cGy boosted to 6440 cGy  Pacemaker/ICD? yes  Possible current pregnancy?no  Is the patient on methotrexate? no  Current Complaints / other details:  Patient has had a right mastectomy.  BP 128/77 (BP Location: Right Wrist, Patient Position: Sitting)   Pulse 92   Temp 97.7 F (36.5 C) (Oral)   Ht _0  (1.651 m)   Wt 222 lb 3.2 oz (100.8 kg)   SpO2 98%   BMI 36.98 kg/m    Wt Readings from Last 3 Encounters:  08/08/16 222 lb 3.2 oz (100.8 kg)  08/06/16 219 lb 1.6 oz (99.4 kg)  07/24/16 216 lb (98 kg)      Jacqulyn Liner, RN 08/08/2016,11:05 AM

## 2016-08-08 NOTE — Progress Notes (Signed)
Radiation Oncology         [phone] ________________________________  Name: [name] MRN: [id number]  Date: 08/08/2016  DOB: March 28, 1956  Re-Evaluation Visit Note  CC: Nicoletta Dress, MD  Nicholas Lose, MD    ICD-9-CM ICD-10-CM   1. 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 Sondra Come  Narrative:  The patient returns today for a re-evaluation. The patient was previously seen in breast clinic on 07/03/16.  On 07/24/16, the patient underwent a left lumpectomy and left sentinel lymph node biopsy. A mastectomy was not recommended given the patient's comorbidities. Lumpectomy revealed malignant spindle cell neoplasm consistent with spindle cell carcinoma, atypical ductal hyperplasia, and fibrocystic changes wit hfocal usual ductal hyperplasia. The tumor was grade 3, measured 1.6 cm, focally less than 0.1 cm from the anterior margin, and triple negative. Excision of the left inferior margin revealed benign fibrocystic changes, no evidence of malignancy, and the final inferior margin was clear. 2 biopsied left axillary sentinel lymph nodes were negative.  The patient presented to genetic counseling on 08/06/16 and the patient consented to testing. Lab results are pending.  Of note, the patient has a pacemaker/defibrillator in the left chest.  ALLERGIES:  is allergic to bee venom; aspirin; ciprofloxacin; codeine; naproxen; other; sulfonamide derivatives; xarelto [rivaroxaban]; atorvastatin; nsaids; tape; and tresiba flextouch [insulin degludec].  Meds: Current Outpatient Prescriptions  Medication Sig Dispense Refill  . acetaminophen  (TYLENOL) 650 MG CR tablet Take 1,300 mg by mouth every 8 (eight) hours as needed for pain.    Marland Kitchen albuterol (PROVENTIL HFA) 108 (90 BASE) MCG/ACT inhaler Inhale 2 puffs into the lungs every 6 (six) hours as needed. For shortness of breath    . amiodarone (PACERONE) 200 MG tablet Take 1 tablet (200 mg total) by mouth daily. 90 tablet 3  . apixaban (ELIQUIS) 5 MG TABS tablet Take 1 tablet (5 mg total) by mouth 2 (two) times daily. 180 tablet 3  . busPIRone (BUSPAR) 30 MG tablet Take 30 mg by mouth 2 (two) times daily.     . carvedilol (COREG) 6.25 MG tablet Take 1 tablet (6.25 mg total) by mouth 2 (two) times daily with a meal. 180 tablet 3  . digoxin (LANOXIN) 0.125 MG tablet Take 1 tablet (0.125 mg total) by mouth every other day. 45 tablet 3  . docusate sodium (COLACE) 100 MG capsule Take 3 capsules (300 mg total) by mouth at bedtime. 10 capsule 0  . DULoxetine (CYMBALTA) 30 MG capsule Take 30 mg by mouth daily.    . furosemide (LASIX) 20 MG tablet Take 3 tablets (60 mg total) by mouth 2 (two) times daily. If 1 to 2 pound weight gain in a day, take extra 20 mg dose 540 tablet 0  . Garlic [id number] MG CAPS Take 500 mg by mouth daily after lunch.     Marland Kitchen HUMALOG KWIKPEN 100 UNIT/ML KiwkPen Inject 14-24 Units into the skin 3 (three) times daily. On average per patient 24 unit with breakfast, 16 units with lunch, & 14-16 units with supper    . HUMALOG MIX 75/25 KWIKPEN (75-25) 100 UNIT/ML Kwikpen Inject 75 Units into the skin 2 (two) times  daily.    . ivabradine (CORLANOR) 5 MG TABS tablet Take 1 tablet (5 mg total) by mouth 2 (two) times daily with a meal. 180 tablet 3  . levothyroxine (SYNTHROID, LEVOTHROID) 75 MCG tablet Take 75 mcg by mouth daily before breakfast.     . meclizine (ANTIVERT) 12.5 MG tablet Take 1 tablet (12.5 mg total) by mouth 3 (three) times daily as needed for dizziness. 20 tablet 0  . Melatonin 1 MG TABS Take 0.5 mg by mouth at bedtime.    . metolazone (ZAROXOLYN) 2.5 MG tablet Take 1  tablet (2.5 mg total) by mouth as needed (for weight gain >3 lbs). 45 tablet 3  . mometasone (NASONEX) 50 MCG/ACT nasal spray Place 1 spray into both nostrils daily. allergies    . Multiple Minerals (CALCIUM-MAGNESIUM-ZINC) TABS Take 1 tablet by mouth daily after lunch.     . nitroGLYCERIN (NITROSTAT) 0.4 MG SL tablet Place 1 tablet (0.4 mg total) under the tongue every 5 (five) minutes as needed. For chest pain 25 tablet 3  . Omega-3 Fatty Acids (FISH OIL) 1000 MG CAPS Take 2 capsules by mouth daily.    . pantoprazole (PROTONIX) 40 MG tablet Take 40 mg by mouth 2 (two) times daily.      . polyethylene glycol powder (GLYCOLAX/MIRALAX) powder Mix 17 grams in 8 oz of water twice daily 850 g 3  . potassium chloride SA (K-DUR,KLOR-CON) 20 MEQ tablet Take 3 tablets (60 mEq total) by mouth 3 (three) times daily. 810 tablet 3  . Propylene Glycol (SYSTANE BALANCE) 0.6 % SOLN Place 1 drop into both eyes 3 (three) times daily.    Marland Kitchen pyridOXINE (VITAMIN B-6) 100 MG tablet Take 100 mg by mouth daily after lunch.     . ramipril (ALTACE) 2.5 MG capsule Take 1 capsule (2.5 mg total) by mouth daily. (Patient taking differently: Take 2.5 mg by mouth at bedtime. ) 90 capsule 3  . rOPINIRole (REQUIP) 1 MG tablet Take 1 mg by mouth at bedtime and may repeat dose one time if needed. IF RESTLESS LEGS PERSIST PATIENT MAY REPEAT ADDITIONAL DOSE    . rosuvastatin (CRESTOR) 10 MG tablet Take 10 mg by mouth at bedtime.    . Simethicone Extra Strength 125 MG CAPS Take 1 tablet by mouth daily as needed (for bloating). Reported on 02/23/2016    . spironolactone (ALDACTONE) 25 MG tablet Take 1 tablet (25 mg total) by mouth daily. 90 tablet 3  . tiotropium (SPIRIVA) 18 MCG inhalation capsule Place 18 mcg into inhaler and inhale daily.      . vitamin B-12 (CYANOCOBALAMIN) 1000 MCG tablet Take 1,000 mcg by mouth daily after lunch.     . vitamin C (ASCORBIC ACID) 500 MG tablet Take 500 mg by mouth daily after lunch.     . vitamin E  400 UNIT capsule Take 400 Units by mouth daily after lunch.     . ALPRAZolam (XANAX) 0.5 MG tablet Take 0.25 mg by mouth 4 (four) times daily as needed for anxiety.     . baclofen (LIORESAL) 20 MG tablet Take 20 mg by mouth 2 (two) times daily as needed for muscle spasms.    Marland Kitchen HYDROcodone-acetaminophen (NORCO) 5-325 MG tablet Take 1-2 tablets by mouth every 6 (six) hours as needed for moderate pain or severe pain. (Patient not taking: Reported on 08/08/2016) 30 tablet 0  . hydrOXYzine (ATARAX/VISTARIL) 50 MG tablet Take 50 mg by mouth every 6 (six) hours as needed for itching ((primarily  taken at bedtime)).     . SUMAtriptan (IMITREX) 100 MG tablet Take 100 mg by mouth daily as needed for migraine.      No current facility-administered medications for this encounter.     Physical Findings: The patient is in no acute distress. Patient is alert and oriented.  height is 5\' 5"  (1.651 m) and weight is 222 lb 3.2 oz (100.8 kg). Her oral temperature is 97.7 F (36.5 C). Her blood pressure is 128/77 and her pulse is 92. Her oxygen saturation is 98%.   Lungs are clear to auscultation bilaterally. Heart has regular rate and rhythm. No palpable cervical, supraclavicular, or axillary adenopathy. Right chest shows mastectomy scar with no sign of recurrence or infection. Left chest notable for a pacemaker/defibrillator in the upper chest. Scar in the left axillary region from her sentinel node procedure. Another scar in approximately the 7:00 position of the left breast. Both are healing well with no sign of drainage or infection.  Lab Findings: Lab Results  Component Value Date   WBC 8.3 07/23/2016   HGB 11.0 (L) 07/23/2016   HCT 34.3 (L) 07/23/2016   MCV 84.1 07/23/2016   PLT 274 07/23/2016    Radiographic Findings: Dg Chest 2 View  Result Date: 07/23/2016 CLINICAL DATA:  Shortness of breath and dizziness beginning today. Headache. EXAM: CHEST  2 VIEW COMPARISON:  03/16/2016 FINDINGS: The heart is  enlarged. Pacemaker/AICD remains in place. There is venous hypertension with mild interstitial prominence suggesting fluid overload/ congestive heart failure. No alveolar edema, collapse or measurable effusion. No acute bone finding. IMPRESSION: Probable congestive heart failure. Cardiomegaly and venous hypertension with early interstitial edema. Electronically Signed   By: Nelson Chimes M.D.   On: 07/23/2016 12:44   Ct Head Wo Contrast  Result Date: 07/23/2016 CLINICAL DATA:  Dizziness, frontal headache. EXAM: CT HEAD WITHOUT CONTRAST TECHNIQUE: Contiguous axial images were obtained from the base of the skull through the vertex without intravenous contrast. COMPARISON:  CT scan of November 14, 2015. FINDINGS: Brain: No mass effect or midline shift is noted. Ventricular size is within normal limits. There is no evidence of mass lesion, hemorrhage or acute infarction. Vascular: Atherosclerosis of carotid siphons is noted. Skull: Normal. Negative for fracture or focal lesion. Sinuses/Orbits: Mild bilateral maxillary mucosal thickening is noted. Other: None. IMPRESSION: No acute intracranial abnormality seen. Electronically Signed   By: Marijo Conception, M.D.   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 The patient underwent a successful lumpectomy and sentinel lymph node biospy with no metastatic spread to the axilla. The patient has a pacemaker/defib in the left upper chest. Given the location of her tumor bed, I feel we could safely stay away from the device so that the pacemaker would not have it moved. The patient appears to understand the course of radiation, toxicities, and benefits of treatment. She is familiar with radiation therapy given she had treatment to the right breast in the past.  Plan:   The patient signed a consent form and a copy was placed in her medical chart. Proceed with CT simulation and treatment 6 weeks out from her surgery, which was on 07/24/16. ____________________________________ -----------------------------------  Blair Promise, PhD, MD  This document serves as a record of services personally performed by Gery Pray, MD. It  was created on his behalf by Darcus Austin, a trained medical scribe. The creation of this record is based on the scribe's personal observations and the provider's statements to them. This document has been checked and approved by the attending provider.

## 2016-08-09 ENCOUNTER — Telehealth (HOSPITAL_COMMUNITY): Payer: Self-pay | Admitting: *Deleted

## 2016-08-09 DIAGNOSIS — I509 Heart failure, unspecified: Secondary | ICD-10-CM

## 2016-08-09 MED ORDER — POTASSIUM CHLORIDE CRYS ER 20 MEQ PO TBCR: 60.0000 meq | EXTENDED_RELEASE_TABLET | Freq: Three times a day (TID) | ORAL | 0 refills | Status: DC

## 2016-08-09 NOTE — Telephone Encounter (Signed)
Sent 4 day supply of K-DUR to local pharmacy for patient, until her mail order arrives.  Patient aware and will go pick up today.

## 2016-08-15 ENCOUNTER — Encounter: Payer: Self-pay | Admitting: Genetic Counselor

## 2016-08-15 ENCOUNTER — Telehealth: Payer: Self-pay | Admitting: Genetic Counselor

## 2016-08-15 DIAGNOSIS — Z1379 Encounter for other screening for genetic and chromosomal anomalies: Secondary | ICD-10-CM | POA: Insufficient documentation

## 2016-08-15 NOTE — Telephone Encounter (Signed)
Negative genetic testing on the common hereditary cancer panel.  It does not explain the cancer in her family.  There is a chance that there is a cancer syndrome in the family but that she did not inherit.  There could be other genes that could explain the cancer but we are not testing for them yet.  Her daughter should undergo screening 10 years younger than her age of onset.  Patient wants a copy of the result.  I released them to her online, but she does not have internet. I will send her a copy.

## 2016-08-16 ENCOUNTER — Telehealth: Payer: Self-pay | Admitting: Oncology

## 2016-08-16 NOTE — Telephone Encounter (Signed)
Called Dr. Jackalyn Lombard office and confirmed that the device clinic has received patient's pacemaker form.

## 2016-08-20 ENCOUNTER — Ambulatory Visit: Payer: Self-pay | Admitting: Genetic Counselor

## 2016-08-20 ENCOUNTER — Encounter: Payer: Self-pay | Admitting: Genetic Counselor

## 2016-08-20 DIAGNOSIS — Z800 Family history of malignant neoplasm of digestive organs: Secondary | ICD-10-CM

## 2016-08-20 DIAGNOSIS — Z803 Family history of malignant neoplasm of breast: Secondary | ICD-10-CM

## 2016-08-20 DIAGNOSIS — Z1379 Encounter for other screening for genetic and chromosomal anomalies: Secondary | ICD-10-CM

## 2016-08-20 DIAGNOSIS — Z8049 Family history of malignant neoplasm of other genital organs: Secondary | ICD-10-CM

## 2016-08-20 DIAGNOSIS — Z171 Estrogen receptor negative status [ER-]: Secondary | ICD-10-CM

## 2016-08-20 DIAGNOSIS — C50511 Malignant neoplasm of lower-outer quadrant of right female breast: Secondary | ICD-10-CM

## 2016-08-20 NOTE — Progress Notes (Addendum)
HPI: [name] was previously seen in the [place] clinic due to a personal history of triple negative breast cancer and family history of cancer and concerns regarding a hereditary predisposition to cancer. Please refer to our prior cancer genetics clinic note for more information regarding [name]'s medical, social and family histories, and our assessment and recommendations, at the time. [name] recent genetic test results were disclosed to her, as were recommendations warranted by these results. These results and recommendations are discussed in more detail below.  CANCER HISTORY:    Breast cancer of lower-outer quadrant of right female breast ([place])   06/13/2004 Surgery    Right breast lumpectomy: Invasive ductal carcinoma, 1.7 cm, grade 3 with high-grade DCIS, 3 lymph nodes negative      07/16/2004 - 10/15/2004 Chemotherapy    Adjuvant chemotherapy with FEC. 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 The Hereditary Gene Panel offered by [facility] includes sequencing and/or deletion duplication testing of the following 43 genes: APC, ATM, AXIN2, BARD1, BMPR1A, BRCA1, BRCA2, BRIP1, CDH1, CDKN2A (p14ARF), CDKN2A (p16INK4a), CHEK2, DICER1, EPCAM (Deletion/duplication testing only), GREM1 (promoter region deletion/duplication testing only), KIT, MEN1, MLH1, MSH2, MSH6, MUTYH, NBN, NF1, PALB2,  PDGFRA, PMS2, POLD1, POLE, PTEN, RAD50, RAD51C, RAD51D, SDHB, SDHC, SDHD, SMAD4, SMARCA4. STK11, TP53, TSC1, TSC2, and VHL.  The following gene was evaluated for sequence changes only: SDHA and HOXB13 c.251G>A variant only.         Malignant neoplasm of lower-inner quadrant of left female breast ([place])   06/24/2016 Initial Diagnosis    Left breast bx 7:00: Malignant spindle cell carcinoma, ADH, pos for CK 8/18, CK 903, CK 5/6, CK AE1/AE3, neg for smooth muscle actin, CD34, desmin, calponin, E-cadherin, ER 0%, PR 0%, Ki-67 20%, HER-2 neg ratio 1.21; 1.2 cm, T1 CN 0 stage IA       FAMILY HISTORY:  We obtained a detailed, 4-generation family history.  Significant diagnoses are listed below: Family History  Problem Relation Age of Onset  . Diabetes Mellitus II Mother   . Lung cancer Mother   . Congestive Heart Failure Mother   . Coronary artery disease Father     CABG x 3  . Hypertension Father   . Diabetes Mellitus II Father   . Breast cancer Cousin 104    maternal first cousin  . Colon cancer Cousin 3    maternal first cousin  . Uterine cancer Cousin     dx in her 75s  . Colon cancer Maternal Aunt     dx in her late 74s  . Uterine cancer Maternal Aunt 66  . Lung cancer Maternal Aunt   . Uterine cancer Maternal Aunt 38  . Colon cancer Maternal Aunt 39  . Breast  cancer Maternal Grandmother 39  . Uterine cancer Maternal Aunt 48  . Uterine cancer Cousin     dx in her 45s  . Uterine cancer Cousin 51    The patient has one daughter who is cancer free.  She has two maternal half brothers who are cancer free.  Her mother died of lung cancer at age 61. Her mother had six sisters, one full brother and a maternal half brother.  One sister had uterine cancer at 62, a second sister had colon cancer at 8 and had a daughter with breast cancer at 29. A third sister had uterine cancer at 42, a fourth sister had uterine cancer at 72 and colon cancer two years later.  She had two daughters with  uterine cancer.  One brother had a son with colon cancer at 57 and a daughter with uterine cancer at 15.  The patient's maternal grandmother had breast cancer at 73.  The patient does not have any information about her biological father.  [name] is unaware of previous family history of genetic testing for hereditary cancer risks. Patient's maternal ancestors are of [place], [place] descent, and paternal ancestors are of Caucasian descent. There is no reported Ashkenazi Jewish ancestry. There is no known consanguinity.  GENETIC TEST RESULTS: Genetic testing reported out on August 19, 2016 through the Hereditary common cancer panel found no deleterious mutations.  The Hereditary Gene Panel offered by [facility] includes sequencing and/or deletion duplication testing of the following 43 genes: APC, ATM, AXIN2, BARD1, BMPR1A, BRCA1, BRCA2, BRIP1, CDH1, CDKN2A (p14ARF), CDKN2A (p16INK4a), CHEK2, DICER1, EPCAM (Deletion/duplication testing only), GREM1 (promoter region deletion/duplication testing only), KIT, MEN1, MLH1, MSH2, MSH6, MUTYH, NBN, NF1, PALB2, PDGFRA, PMS2, POLD1, POLE, PTEN, RAD50, RAD51C, RAD51D, SDHB, SDHC, SDHD, SMAD4, SMARCA4. STK11, TP53, TSC1, TSC2, and VHL.  The following gene was evaluated for sequence changes only: SDHA and HOXB13 c.251G>A variant only.  The test report has been scanned into EPIC and is located under the Molecular Pathology section of the Results Review tab.   We discussed with [name] that since the current genetic testing is not perfect, it is possible there may be a gene mutation in one of these genes that current testing cannot detect, but that chance is small. We also discussed, that it is possible that another gene that has not yet been discovered, or that we have not yet tested, is responsible for the cancer diagnoses in the family, and it is, therefore, important to remain in touch with cancer genetics in the future so that we can  continue to offer [name] the most up to date genetic testing.   CANCER SCREENING RECOMMENDATIONS:  This result is reassuring and indicates that [name] likely does not have an increased risk for a future cancer due to a mutation in one of these genes. This normal test also suggests that [name]'s cancer was most likely not due to an inherited predisposition associated with one of these genes.  Most cancers happen by chance and this negative test suggests that her cancer falls into this category.  We, therefore, recommended she continue to follow the cancer management and screening guidelines provided by her oncology and primary healthcare provider.   RECOMMENDATIONS FOR FAMILY MEMBERS: Women in this family might be at some increased risk of developing cancer, over the general population risk, simply due to the family history of cancer. We recommended women in this family have a yearly mammogram beginning at age 16,  or 10 years younger than the earliest onset of cancer, an annual clinical breast exam, and perform monthly breast self-exams. Women in this family should also have a gynecological exam as recommended by their primary provider. All family members should have a colonoscopy by age 61.  FOLLOW-UP: Lastly, we discussed with [name] that cancer genetics is a rapidly advancing field and it is possible that new genetic tests will be appropriate for her and/or her family members in the future. We encouraged her to remain in contact with cancer genetics on an annual basis so we can update her personal and family histories and let her know of advances in cancer genetics that may benefit this family.   Our contact number was provided. [name] questions were answered to her satisfaction, and she knows she is welcome to call us at anytime with additional questions or concerns.   Roma Kayser, MS, [facility] Certified Genetic Counselor [email or web address]

## 2016-08-21 ENCOUNTER — Other Ambulatory Visit (HOSPITAL_COMMUNITY): Payer: Self-pay | Admitting: Cardiology

## 2016-08-21 DIAGNOSIS — I5022 Chronic systolic (congestive) heart failure: Secondary | ICD-10-CM

## 2016-08-23 ENCOUNTER — Other Ambulatory Visit (HOSPITAL_COMMUNITY): Payer: Self-pay | Admitting: *Deleted

## 2016-08-23 DIAGNOSIS — I5022 Chronic systolic (congestive) heart failure: Secondary | ICD-10-CM

## 2016-08-23 MED ORDER — FUROSEMIDE 20 MG PO TABS: 60.0000 mg | ORAL_TABLET | Freq: Two times a day (BID) | ORAL | 0 refills | Status: DC

## 2016-09-02 DIAGNOSIS — K31811 Angiodysplasia of stomach and duodenum with bleeding: Secondary | ICD-10-CM | POA: Diagnosis not present

## 2016-09-02 DIAGNOSIS — I10 Essential (primary) hypertension: Secondary | ICD-10-CM | POA: Diagnosis not present

## 2016-09-02 DIAGNOSIS — E1140 Type 2 diabetes mellitus with diabetic neuropathy, unspecified: Secondary | ICD-10-CM | POA: Diagnosis not present

## 2016-09-02 DIAGNOSIS — E785 Hyperlipidemia, unspecified: Secondary | ICD-10-CM | POA: Diagnosis not present

## 2016-09-02 DIAGNOSIS — L02412 Cutaneous abscess of left axilla: Secondary | ICD-10-CM | POA: Diagnosis not present

## 2016-09-02 DIAGNOSIS — I509 Heart failure, unspecified: Secondary | ICD-10-CM | POA: Diagnosis not present

## 2016-09-10 ENCOUNTER — Ambulatory Visit (HOSPITAL_BASED_OUTPATIENT_CLINIC_OR_DEPARTMENT_OTHER)
Admission: RE | Admit: 2016-09-10 | Discharge: 2016-09-10 | Disposition: A | Discharge status: Home / Self Care | Payer: Medicare Other | Source: Ambulatory Visit | Attending: Internal Medicine | Admitting: Internal Medicine

## 2016-09-10 ENCOUNTER — Ambulatory Visit (HOSPITAL_COMMUNITY)
Admission: RE | Admit: 2016-09-10 | Discharge: 2016-09-10 | Disposition: A | Discharge status: Home / Self Care | Payer: Medicare Other | Source: Ambulatory Visit | Attending: Internal Medicine | Admitting: Internal Medicine

## 2016-09-10 ENCOUNTER — Encounter (HOSPITAL_COMMUNITY): Payer: Self-pay | Admitting: Internal Medicine

## 2016-09-10 VITALS — BP 116/72 | HR 76 | Wt 212.5 lb

## 2016-09-10 DIAGNOSIS — I2510 Atherosclerotic heart disease of native coronary artery without angina pectoris: Secondary | ICD-10-CM | POA: Diagnosis not present

## 2016-09-10 DIAGNOSIS — I5022 Chronic systolic (congestive) heart failure: Secondary | ICD-10-CM | POA: Insufficient documentation

## 2016-09-10 DIAGNOSIS — G4733 Obstructive sleep apnea (adult) (pediatric): Secondary | ICD-10-CM | POA: Diagnosis not present

## 2016-09-10 DIAGNOSIS — I4821 Permanent atrial fibrillation: Secondary | ICD-10-CM

## 2016-09-10 DIAGNOSIS — E669 Obesity, unspecified: Secondary | ICD-10-CM | POA: Diagnosis not present

## 2016-09-10 DIAGNOSIS — Z95810 Presence of automatic (implantable) cardiac defibrillator: Secondary | ICD-10-CM | POA: Diagnosis not present

## 2016-09-10 DIAGNOSIS — I472 Ventricular tachycardia: Secondary | ICD-10-CM | POA: Diagnosis not present

## 2016-09-10 DIAGNOSIS — M7989 Other specified soft tissue disorders: Secondary | ICD-10-CM

## 2016-09-10 DIAGNOSIS — Z79899 Other long term (current) drug therapy: Secondary | ICD-10-CM | POA: Diagnosis not present

## 2016-09-10 DIAGNOSIS — C50919 Malignant neoplasm of unspecified site of unspecified female breast: Secondary | ICD-10-CM | POA: Insufficient documentation

## 2016-09-10 DIAGNOSIS — Z7901 Long term (current) use of anticoagulants: Secondary | ICD-10-CM | POA: Insufficient documentation

## 2016-09-10 DIAGNOSIS — I480 Paroxysmal atrial fibrillation: Secondary | ICD-10-CM | POA: Insufficient documentation

## 2016-09-10 DIAGNOSIS — E119 Type 2 diabetes mellitus without complications: Secondary | ICD-10-CM | POA: Diagnosis not present

## 2016-09-10 DIAGNOSIS — I428 Other cardiomyopathies: Secondary | ICD-10-CM | POA: Insufficient documentation

## 2016-09-10 DIAGNOSIS — I110 Hypertensive heart disease with heart failure: Secondary | ICD-10-CM | POA: Diagnosis not present

## 2016-09-10 DIAGNOSIS — Z87891 Personal history of nicotine dependence: Secondary | ICD-10-CM | POA: Insufficient documentation

## 2016-09-10 DIAGNOSIS — E785 Hyperlipidemia, unspecified: Secondary | ICD-10-CM | POA: Insufficient documentation

## 2016-09-10 DIAGNOSIS — I4729 Other ventricular tachycardia: Secondary | ICD-10-CM

## 2016-09-10 DIAGNOSIS — I482 Chronic atrial fibrillation: Secondary | ICD-10-CM

## 2016-09-10 LAB — COMPREHENSIVE METABOLIC PANEL
ALT: 15 U/L (ref 14–54)
AST: 21 U/L (ref 15–41)
Albumin: 3.4 g/dL — ABNORMAL LOW (ref 3.5–5.0)
Alkaline Phosphatase: 71 U/L (ref 38–126)
Anion gap: 11 (ref 5–15)
BUN: 17 mg/dL (ref 6–20)
CHLORIDE: 94 mmol/L — AB (ref 101–111)
CO2: 30 mmol/L (ref 22–32)
CREATININE: 1.11 mg/dL — AB (ref 0.44–1.00)
Calcium: 9.4 mg/dL (ref 8.9–10.3)
GFR calc Af Amer: >60 mL/min (ref >60)
GFR, EST NON AFRICAN AMERICAN: 52 mL/min — AB (ref >60)
Glucose, Bld: 174 mg/dL — ABNORMAL HIGH (ref 65–99)
Potassium: 3.3 mmol/L — ABNORMAL LOW (ref 3.5–5.1)
SODIUM: 135 mmol/L (ref 135–145)
Total Bilirubin: 1.2 mg/dL (ref 0.3–1.2)
Total Protein: 7.6 g/dL (ref 6.5–8.1)

## 2016-09-10 LAB — TSH: TSH: 3.795 u[IU]/mL (ref 0.350–4.500)

## 2016-09-10 LAB — DIGOXIN LEVEL: DIGOXIN LVL: 0.5 ng/mL — AB (ref 0.8–2.0)

## 2016-09-10 LAB — T4, FREE: Free T4: 1.25 ng/dL — ABNORMAL HIGH (ref 0.61–1.12)

## 2016-09-10 MED ORDER — SACUBITRIL-VALSARTAN 24-26 MG PO TABS: 1.0000 | ORAL_TABLET | Freq: Two times a day (BID) | ORAL | 3 refills | Status: DC

## 2016-09-10 NOTE — Progress Notes (Signed)
Patient ID: [name], female   DOB: 01-16-1956, 61 y.o.   MRN: [id number]    Advanced Heart Failure Clinic Note   Endocrinologist: Dr Loanne Drilling  Oncologist: Dr. Humphrey Rolls PCP: [name], NP ([place]) Primary HF MD: Dr Haroldine Laws  HPI [name] is a 61 year old female with history of R breast cancer (2005, 2008), obesity, HTN, DM, IBS, HLD, OSA, chemo induced cardiomyopathy s/p ICD and chronic systolic HF.    She had chemo induced cardiomyopathy.  Her EF has fallen from 45% per Cardiac MRI in July 2010 to 20-25% in 10/2012. S/P single chamber ICD (St Jude) implantation on 11/10/12 by Dr. Rayann Heman.    Admitted 08/19/15 with chest pain. Had [facility] with mild CAD and increased filling pressures. CPX placed on hold due to breast mass. She was told to have breast biopsy ASAP with her history of breast cancer x 2. Discharged on lasix 40 mg po twice daily, carvedilol 6.25 mg twice a day, ramipril 5 mg at bed time, dig 0.125 mg daily and ivabradine 5 mg twice a day. Discharge weight was 220 pounds.    Admitted 12/21/15 with increased dyspnea and chest pain. Additionally, a new breast lump was discovered, with outpatient follow up arranged. She diuresed 15 lbs. Discharge weight 213 lbs.  Admitted 7/17 with GIB. 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 Ardis Hughs. Eliquis restarted without problem.   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 Had lumpectomy 07/24/16 with Dr. Dalbert Batman. Pending XRT. No chemo planned.  She returns today for follow-up. Feels ok. Can do ADLs without too much trouble but occasionally uses the buggy. Goes slowly and takes breaks. Overall stable. No orthopnea or PND. LLE is swelling. Taking 60 bid with extra as needed. Took metolazone over weekend and lost 6 pounds. Takes super pill about 3-4x/month.  Says she was taking meds wrong and now doing better taking them with her meal. Weight down 10 pounds. Says she watches salt closely but drinks a a  lot of fluid when her sugars are up   ICD interrogated personally.  No AF/VT. Volume status ok.   CPX 2017  Peak VO2: 12.7 (73% predicted peak VO2) VE/VCO2 slope: 32.3 OUES: 1.62 Peak RER: 1.08 Exercise testing with gas exchange demonstrates a mild to moderate functional impairment when compared to matched sedentary norms. At peak exercise, patient appears to have mild circulatory limitation, Her body habitus and related restrictive lung physiology are also likely contributing to her exercise intolerance. Chronotropic incompetence was also present.   RHC/LCH  08/20/2014   Mid Cx lesion, 30% stenosed.  3rd Mrg lesion, 30% stenosed. Findings: RA = 12 RV = 55/6/12 PA = 54/21 (34) PCW = 22 Ao 93/58 (71) LV 106/8/26 Fick cardiac output/index = 4.2/2.1 PVR = 2.8 SVR = 1115 FA sat = 93% PA sat = 54%, 53% Assessment: 1. Very mild CAD 2. Mild to moderately increased filling pressures 3. Severe LV dysfunction due to NICM EF 20%medications.   [place] on 01/30/12 showed decompensated HF with low cardiac output consistent with cardiogenic shock.  [place] 6/27  RA = 20  RV = 55/13/22  PA =56/34 (44)  PCW = 28  Fick cardiac output/index = 3.1/1.5  PVR = 5.3 [place]  FA sat = 91%  PA sat = 42%, 48%  SVC sat = 46%  Echo 08/12/11: Left ventricle: mildly dilated, EF 20% to 25%. Diffuse hypokinesis. Doppler parameters are consistent with restrictive physiology,  indicative of decreased left ventricular diastolic compliance and/or increased left atrial pressure. Left atrium was moderately dilated. Right atrium mildly dilated. 04/09/12 ECHO EF 30-35% LV mildly dilated  Hyopkinesis 10/07/12 ECHO EF 20-25% RV ok. 03/31/14: ECHO EF 30-35%, severe HK inferior/inferoseptal walls, mild MR, LA mod dilated, RA mildly dilated 06/28/2015: ECHO EF 25% RV mildly dilated. Severe HK inferior wall.  08/21/2015: Echo EF 30-35% Grade III DD  Myoview in 05/2010 EF 59%. No ischemia.    ROS: All systems negative  except as listed in HPI, PMH and Problem List.  Social History   Social History  . Marital status: Divorced    Spouse name: N/A  . Number of children: 1  . Years of education: N/A   Occupational History  . Disabled Unemployed   Social History Main Topics  . Smoking status: Former Smoker    Quit date: 04/12/2001  . Smokeless tobacco: Never Used  . Alcohol use No  . Drug use: No  . Sexual activity: Not Currently   Other Topics Concern  . Not on file   Social History Narrative   Lives with mother and brother Philis Nettle         Family History  Problem Relation Age of Onset  . Diabetes Mellitus II Mother   . Lung cancer Mother   . Congestive Heart Failure Mother   . Coronary artery disease Father     CABG x 3  . Hypertension Father   . Diabetes Mellitus II Father   . Breast cancer Cousin 64    maternal first cousin  . Colon cancer Cousin 61    maternal first cousin  . Uterine cancer Cousin     dx in her 49s  . Colon cancer Maternal Aunt     dx in her late 44s  . Uterine cancer Maternal Aunt 66  . Lung cancer Maternal Aunt   . Uterine cancer Maternal Aunt 38  . Colon cancer Maternal Aunt 54  . Breast cancer Maternal Grandmother 68  . Uterine cancer Maternal Aunt 48  . Uterine cancer Cousin     dx in her 28s  . Uterine cancer Cousin 58    Past Medical History:  Diagnosis Date  . AICD (automatic cardioverter/defibrillator) present    st jude  . Anxiety   . 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 Asthma   . CHF (congestive heart failure) ([place])   . COPD (chronic obstructive pulmonary disease) ([place])   . Depression    followed by a psychiatrist  . DYSLIPIDEMIA 05/01/2009  . Dysrhythmia    atrial fibrillation  . Family history of breast cancer   . Family history of colon cancer   . Family history of uterine cancer   . Gastroparesis   . GERD (gastroesophageal reflux disease)   . Headache   . History of breast cancer 2006   with return in 2008, s/p mastectomy, XRT and  chemotherapy  . Hypertension   . 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 IBS (irritable bowel syndrome)   . ICD (implantable cardiac defibrillator) in place 11/10/2012   ICD (11/2012)  . Malignant neoplasm of lower-inner quadrant of left female breast ([place]) 06/26/2016  . Myocardial infarction   . OBESITY 07/24/2007  . OBSTRUCTIVE SLEEP APNEA 07/24/2007   with CPAP compliance  . Osteoarthritis   . Persistent atrial fibrillation ([place]) 09/2014  . Presence of permanent cardiac pacemaker   . RESTLESS LEG SYNDROME 07/24/2007  . SYSTOLIC HEART FAILURE, CHRONIC 01/05/2009   a. chemo induced cardiomyopathy b.  cMRI (02/2009) EF 45% c. Myoview 05/2010: EF 59%, no ischemia d. RHC (01/2012) RA 20, RV 55/13/22, PA 56/34 (44), PCWP 28, Fick CO/CI: 3.1 / 1.5, PVR 5.3 WU, PA sat 42% & 48% e. ECHO (10/2012) EF 20-25%, diff HK, MV calcified annulus f. ECHO (03/2014) EF 30-35%, sev HK, inferior/inferoseptal walls, mild MR    Current Outpatient Prescriptions  Medication Sig Dispense Refill  . acetaminophen (TYLENOL) 650 MG CR tablet Take 1,300 mg by mouth every 8 (eight) hours as needed for pain.    Marland Kitchen albuterol (PROVENTIL HFA) 108 (90 BASE) MCG/ACT inhaler Inhale 2 puffs into the lungs every 6 (six) hours as needed. For shortness of breath    . ALPRAZolam (XANAX) 0.5 MG tablet Take 0.25 mg by mouth 4 (four) times daily as needed for anxiety.     Marland Kitchen amiodarone (PACERONE) 200 MG tablet Take 1 tablet (200 mg total) by mouth daily. 90 tablet 3  . amoxicillin (AMOXIL) 500 MG tablet Take 500 mg by mouth 2 (two) times daily. For 10 days    . apixaban (ELIQUIS) 5 MG TABS tablet Take 1 tablet (5 mg total) by mouth 2 (two) times daily. 180 tablet 3  . baclofen (LIORESAL) 20 MG tablet Take 20 mg by mouth 2 (two) times daily as needed for muscle spasms.    . busPIRone (BUSPAR) 30 MG tablet Take 30 mg by mouth 2 (two) times daily.     . carvedilol (COREG) 6.25 MG tablet Take 1 tablet (6.25 mg total) by mouth 2 (two) times daily with  a meal. 180 tablet 3  . digoxin (LANOXIN) 0.125 MG tablet Take 1 tablet (0.125 mg total) by mouth every other day. 45 tablet 3  . docusate sodium (COLACE) 100 MG capsule Take 3 capsules (300 mg total) by mouth at bedtime. 10 capsule 0  . DULoxetine (CYMBALTA) 30 MG capsule Take 30 mg by mouth daily.    . furosemide (LASIX) 20 MG tablet TAKE 3 TABLETS BY MOUTH 2  TIMES DAILY. IF 1 TO 2  POUND WEIGHT GAIN IN A DAY, TAKE EXTRA 20 MG DOSE 540 tablet 3  . Garlic [id number] MG CAPS Take 500 mg by mouth daily after lunch.     Marland Kitchen HUMALOG KWIKPEN 100 UNIT/ML KiwkPen Inject 14-24 Units into the skin 3 (three) times daily. On average per patient 24 unit with breakfast, 16 units with lunch, & 14-16 units with supper    . HUMALOG MIX 75/25 KWIKPEN (75-25) 100 UNIT/ML Kwikpen Inject 75 Units into the skin 2 (two) times daily.    . hydrOXYzine (ATARAX/VISTARIL) 50 MG tablet Take 50 mg by mouth every 6 (six) hours as needed for itching ((primarily taken at bedtime)).     . ivabradine (CORLANOR) 5 MG TABS tablet Take 1 tablet (5 mg total) by mouth 2 (two) times daily with a meal. 180 tablet 3  . levothyroxine (SYNTHROID, LEVOTHROID) 75 MCG tablet Take 75 mcg by mouth daily before breakfast.     . meclizine (ANTIVERT) 12.5 MG tablet Take 1 tablet (12.5 mg total) by mouth 3 (three) times daily as needed for dizziness. 20 tablet 0  . Melatonin 1 MG TABS Take 0.5 mg by mouth at bedtime.    . mometasone (NASONEX) 50 MCG/ACT nasal spray Place 1 spray into both nostrils daily. allergies    . Multiple Minerals (CALCIUM-MAGNESIUM-ZINC) TABS Take 1 tablet by mouth daily after lunch.     . Omega-3 Fatty Acids (FISH OIL) 1000 MG CAPS Take 2  capsules by mouth daily.    . pantoprazole (PROTONIX) 40 MG tablet Take 40 mg by mouth 2 (two) times daily.      . polyethylene glycol powder (GLYCOLAX/MIRALAX) powder Mix 17 grams in 8 oz of water twice daily 850 g 3  . potassium chloride SA (K-DUR,KLOR-CON) 20 MEQ tablet Take 3 tablets (60 mEq  total) by mouth 3 (three) times daily. 36 tablet 0  . Propylene Glycol (SYSTANE BALANCE) 0.6 % SOLN Place 1 drop into both eyes 3 (three) times daily.    Marland Kitchen pyridOXINE (VITAMIN B-6) 100 MG tablet Take 100 mg by mouth daily after lunch.     . ramipril (ALTACE) 2.5 MG capsule Take 1 capsule (2.5 mg total) by mouth daily. 90 capsule 3  . rOPINIRole (REQUIP) 1 MG tablet Take 1 mg by mouth at bedtime and may repeat dose one time if needed. IF RESTLESS LEGS PERSIST PATIENT MAY REPEAT ADDITIONAL DOSE    . rosuvastatin (CRESTOR) 10 MG tablet Take 10 mg by mouth at bedtime.    . Simethicone Extra Strength 125 MG CAPS Take 1 tablet by mouth daily as needed (for bloating). Reported on 02/23/2016    . spironolactone (ALDACTONE) 25 MG tablet Take 1 tablet (25 mg total) by mouth daily. 90 tablet 3  . SUMAtriptan (IMITREX) 100 MG tablet Take 100 mg by mouth daily as needed for migraine.     . tiotropium (SPIRIVA) 18 MCG inhalation capsule Place 18 mcg into inhaler and inhale daily.      . vitamin B-12 (CYANOCOBALAMIN) 1000 MCG tablet Take 1,000 mcg by mouth daily after lunch.     . vitamin C (ASCORBIC ACID) 500 MG tablet Take 500 mg by mouth daily after lunch.     . vitamin E 400 UNIT capsule Take 400 Units by mouth daily after lunch.     . metolazone (ZAROXOLYN) 2.5 MG tablet Take 1 tablet (2.5 mg total) by mouth as needed (for weight gain >3 lbs). (Patient not taking: Reported on 09/10/2016) 45 tablet 3  . nitroGLYCERIN (NITROSTAT) 0.4 MG SL tablet Place 1 tablet (0.4 mg total) under the tongue every 5 (five) minutes as needed. For chest pain (Patient not taking: Reported on 09/10/2016) 25 tablet 3   No current facility-administered medications for this encounter.    Vitals:   09/10/16 1051  BP: 116/72  Pulse: 76  SpO2: 100%  Weight: 212 lb 8 oz (96.4 kg)   Wt Readings from Last 3 Encounters:  09/10/16 212 lb 8 oz (96.4 kg)  08/08/16 222 lb 3.2 oz (100.8 kg)  08/06/16 219 lb 1.6 oz (99.4 kg)     PHYSICAL  EXAM: General:  Obese, NAD Ambulated in the clinic without difficulty   HEENT: normal  Neck: supple. JVP 7 cm carotids 2+ bilaterally; no bruits. No thyromegaly or nodule noted.  Cor: PMI normal.Regular . no S3. Lungs: CTAB, normal effort Abdomen: soft, obese, NT, ND, no HSM. No bruits or masses. +BS  Extremities: no cyanosis, clubbing, rash, Trace edema 1/2 way to calf.  Neuro: alert & orientedx3, cranial nerves grossly intact. Moves all 4 extremities w/o difficulty. Affect pleasant.  ICD interrogated personally. Volume status mildly elevated. No VtT  ASSESSMENT & PLAN:  1) Chronic systolic HF, NICM thought to be chemo induced, s/p ST Jude  ICD. Most Recent ECHO 06/28/2015  EF ~20%. RV mildly dilated. Echo 1/17 30-35% - Has NYHA III symptoms.   - Continue lasix 60 mg BID. Continue spiro 25 mg daily. -  Continue metolazone 2.5 mg as needed ( should take 40 meq of potassium on these days).  - Continue coreg 6.25 mg BID  - Will change ramipril to Entresto 24/26  -Continue digoxin 0.125 mg daily .  - Continue corlanor 5 mg twice a day.    - CPX result with mild -mod circulatory limitation in 1/16   - Reinforced the need and importance of daily weights, a low sodium diet, and fluid restriction (less than 2 L a day). Instructed to call the HF clinic if weight increases more than 3 lbs overnight or 5 lbs in a week.  - Check labs today Echo in 2-3 months 2) HTN - Blood pressure well controlled. Watch closely with Entresto 3) OSA - Compliant with CPAP nightly. 4) HLD - Followed by PCP. Crestor dose recently reduced 5) Obesity - Continues to work on weight loss 6) CAD - mild nonobstructive CAD on 2017 cath. Will continue statin, BB and ACE-I. No s/s of ischemia. 7) NSVT- Continue  amio to 200 mg daily. Needs yearly eye exam.  TSH January 2017 ok.  8) Breast Cancer - s/p lumpectomy. Pending XRT 9) Paroxysmal Afib - in NSR by exam. No AF on corevue.  - Continue Eliquis 5 mg BID 10) LLE  swelling - likely due to previous fracture but will get u/s to exclude DVT. Use compression stockings.     Glori Bickers MD 11:31 AM

## 2016-09-10 NOTE — Progress Notes (Signed)
VASCULAR LAB PRELIMINARY  PRELIMINARY  PRELIMINARY  PRELIMINARY  Left lower extremity venous duplex completed.    Preliminary report:  Left:  No evidence of DVT, superficial thrombosis, or Baker's cyst.  [name], RVS 09/10/2016, 1:47 PM

## 2016-09-10 NOTE — Patient Instructions (Signed)
Stop Ramipril  Start Entresto 24/26 mg Twice daily STARTING THUR 2/8 AM  Labs today  Your physician has requested that you have a lower or upper extremity venous duplex. This test is an ultrasound of the veins in the legs or arms. It looks at venous blood flow that carries blood from the heart to the legs or arms. Allow one hour for a Lower Venous exam. Allow thirty minutes for an Upper Venous exam. There are no restrictions or special instructions.  Your physician recommends that you schedule a follow-up appointment in: 3 months with echocardiogram

## 2016-09-16 ENCOUNTER — Ambulatory Visit
Admission: RE | Admit: 2016-09-16 | Discharge: 2016-09-16 | Disposition: A | Discharge status: Home / Self Care | Payer: Medicare Other | Source: Ambulatory Visit | Attending: Radiation Oncology | Admitting: Radiation Oncology

## 2016-09-16 DIAGNOSIS — Z171 Estrogen receptor negative status [ER-]: Secondary | ICD-10-CM | POA: Diagnosis not present

## 2016-09-16 DIAGNOSIS — Z510 Encounter for antineoplastic radiation therapy: Secondary | ICD-10-CM | POA: Diagnosis not present

## 2016-09-16 DIAGNOSIS — C50312 Malignant neoplasm of lower-inner quadrant of left female breast: Secondary | ICD-10-CM

## 2016-09-16 DIAGNOSIS — R51 Headache: Secondary | ICD-10-CM | POA: Diagnosis not present

## 2016-09-16 DIAGNOSIS — R42 Dizziness and giddiness: Secondary | ICD-10-CM | POA: Diagnosis not present

## 2016-09-17 ENCOUNTER — Other Ambulatory Visit: Payer: Self-pay | Admitting: *Deleted

## 2016-09-17 ENCOUNTER — Encounter (HOSPITAL_COMMUNITY): Payer: Self-pay | Admitting: *Deleted

## 2016-09-17 NOTE — Patient Outreach (Signed)
[facility]) Care Management  09/17/2016  KELAHNI VATTER 1956-04-02 [id number]    RN Health Coach telephone call to patient.  . Per person answering phone stated patient not there. RN left  [employer] compliance message for patient. Plan: RN will call patient again within 14 days.  [place] Care Management [phone]

## 2016-09-18 ENCOUNTER — Telehealth (HOSPITAL_COMMUNITY): Payer: Self-pay | Admitting: *Deleted

## 2016-09-18 DIAGNOSIS — R51 Headache: Secondary | ICD-10-CM | POA: Diagnosis not present

## 2016-09-18 DIAGNOSIS — Z171 Estrogen receptor negative status [ER-]: Secondary | ICD-10-CM | POA: Diagnosis not present

## 2016-09-18 DIAGNOSIS — C50312 Malignant neoplasm of lower-inner quadrant of left female breast: Secondary | ICD-10-CM | POA: Diagnosis not present

## 2016-09-18 DIAGNOSIS — I509 Heart failure, unspecified: Secondary | ICD-10-CM

## 2016-09-18 DIAGNOSIS — Z510 Encounter for antineoplastic radiation therapy: Secondary | ICD-10-CM | POA: Diagnosis not present

## 2016-09-18 DIAGNOSIS — I5022 Chronic systolic (congestive) heart failure: Secondary | ICD-10-CM

## 2016-09-18 DIAGNOSIS — R42 Dizziness and giddiness: Secondary | ICD-10-CM | POA: Diagnosis not present

## 2016-09-18 MED ORDER — POTASSIUM CHLORIDE CRYS ER 20 MEQ PO TBCR: EXTENDED_RELEASE_TABLET | ORAL | 0 refills | Status: DC

## 2016-09-18 NOTE — Telephone Encounter (Signed)
Notes Recorded by Scarlette Calico, RN on 09/18/2016 at 4:44 PM EST Pt aware and agreeable, she states she has been taking KCL 60 meq BID with an extra tab if she takes metolazone or an extra lasix. She will increase dose, med list updated, repeat lab 3/1. ------  Notes Recorded by Kennieth Rad, RN on 09/17/2016 at 9:08 AM EST Called and left message to call her [place] back. Letter mailed today. ------  Notes Recorded by Harvie Junior, [place] on 09/16/2016 at 2:49 PM EST Pt not available will try again later ------  Notes Recorded by Harvie Junior, [place] on 09/13/2016 at 2:47 PM EST Left vm for pt to call for lab results ------  Notes Recorded by Harvie Junior, CMA on 09/12/2016 at 9:22 AM EST Left msg for pt to call for lab results ------  Notes Recorded by Jolaine Artist, MD on 09/10/2016 at 11:37 PM EST k is low. Give K 40 x 1 then increase daily dose by 20. Repeat 2 weeks.

## 2016-09-22 NOTE — Progress Notes (Signed)
  Radiation Oncology         [phone] ________________________________  Name: [name] MRN: [id number]  Date: 09/16/2016  DOB: 08-Jul-1956  SIMULATION AND TREATMENT PLANNING NOTE    ICD-9-CM ICD-10-CM   1. 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  Identity was confirmed.  All relevant records and images related to the planned course of therapy were reviewed.  The patient freely provided informed written consent to proceed with treatment after reviewing the details related to the planned course of therapy. The consent form was witnessed and verified by the simulation staff.  Then, the patient was set-up in a stable reproducible  supine position for radiation therapy.  CT images were obtained.  Surface markings were placed.  The CT images were loaded into the planning software.  Then the target and avoidance structures were contoured.  Treatment planning then occurred.  The radiation prescription was entered and confirmed.  Then, I designed and supervised the construction of a total of 3 medically necessary complex treatment devices.  I have requested : 3D Simulation  I have requested a DVH of the following structures: heart lungs, lumpectomy cavity, pacemaker/defib..  I have ordered:Diode first day of treatment  PLAN:  The patient will receive 50.4 Gy in 28 fractions Followed by a boost to the lumpectomy cavity of 12 gray for a cumulative dose of 62.4 gray. Patient is not a candidate for hypofractionated accelerated radiation therapy given her prior history of right breast cancer treatment and potential for overlap over the sternum area.    -----------------------------------  Blair Promise, PhD, MD

## 2016-09-24 ENCOUNTER — Ambulatory Visit
Admission: RE | Admit: 2016-09-24 | Discharge: 2016-09-24 | Disposition: A | Discharge status: Home / Self Care | Payer: Medicare Other | Source: Ambulatory Visit | Attending: Radiation Oncology | Admitting: Radiation Oncology

## 2016-09-24 DIAGNOSIS — R51 Headache: Secondary | ICD-10-CM | POA: Diagnosis not present

## 2016-09-24 DIAGNOSIS — Z171 Estrogen receptor negative status [ER-]: Secondary | ICD-10-CM | POA: Diagnosis not present

## 2016-09-24 DIAGNOSIS — C50312 Malignant neoplasm of lower-inner quadrant of left female breast: Secondary | ICD-10-CM | POA: Diagnosis not present

## 2016-09-24 DIAGNOSIS — C50511 Malignant neoplasm of lower-outer quadrant of right female breast: Secondary | ICD-10-CM

## 2016-09-24 DIAGNOSIS — Z510 Encounter for antineoplastic radiation therapy: Secondary | ICD-10-CM | POA: Diagnosis not present

## 2016-09-24 DIAGNOSIS — R42 Dizziness and giddiness: Secondary | ICD-10-CM | POA: Diagnosis not present

## 2016-09-24 NOTE — Progress Notes (Signed)
  Radiation Oncology         [phone] ________________________________  Name: [name] MRN: [id number]  Date: 09/24/2016  DOB: April 01, 1956  Simulation Verification Note    ICD-9-CM ICD-10-CM   1. Malignant neoplasm of lower-outer quadrant of right breast of female, estrogen receptor negative ([facility]) 174.5 C50.511    V86.1 Z17.1     Status: outpatient  NARRATIVE: The patient was brought to the treatment unit and placed in the planned treatment position. The clinical setup was verified. Then port films were obtained and uploaded to the radiation oncology medical record software.  The treatment beams were carefully compared against the planned radiation fields. The position location and shape of the radiation fields was reviewed. They targeted volume of tissue appears to be appropriately covered by the radiation beams. Organs at risk appear to be excluded as planned.  Based on my personal review, I approved the simulation verification. The patient's treatment will proceed as planned.  -----------------------------------  Blair Promise, PhD, MD

## 2016-09-25 ENCOUNTER — Ambulatory Visit
Admission: RE | Admit: 2016-09-25 | Discharge: 2016-09-25 | Disposition: A | Discharge status: Home / Self Care | Payer: Medicare Other | Source: Ambulatory Visit | Attending: Radiation Oncology | Admitting: Radiation Oncology

## 2016-09-25 ENCOUNTER — Ambulatory Visit (INDEPENDENT_AMBULATORY_CARE_PROVIDER_SITE_OTHER): Payer: Medicare Other | Admitting: Internal Medicine

## 2016-09-25 ENCOUNTER — Encounter: Payer: Self-pay | Admitting: Internal Medicine

## 2016-09-25 VITALS — BP 100/58 | HR 95 | Ht 65.0 in | Wt 221.0 lb

## 2016-09-25 DIAGNOSIS — Z510 Encounter for antineoplastic radiation therapy: Secondary | ICD-10-CM | POA: Diagnosis not present

## 2016-09-25 DIAGNOSIS — Z171 Estrogen receptor negative status [ER-]: Secondary | ICD-10-CM | POA: Diagnosis not present

## 2016-09-25 DIAGNOSIS — I480 Paroxysmal atrial fibrillation: Secondary | ICD-10-CM | POA: Diagnosis not present

## 2016-09-25 DIAGNOSIS — R51 Headache: Secondary | ICD-10-CM | POA: Diagnosis not present

## 2016-09-25 DIAGNOSIS — C50312 Malignant neoplasm of lower-inner quadrant of left female breast: Secondary | ICD-10-CM | POA: Diagnosis not present

## 2016-09-25 DIAGNOSIS — I5022 Chronic systolic (congestive) heart failure: Secondary | ICD-10-CM

## 2016-09-25 DIAGNOSIS — R42 Dizziness and giddiness: Secondary | ICD-10-CM | POA: Diagnosis not present

## 2016-09-25 NOTE — Progress Notes (Signed)
Electrophysiology Office Note   Date:  09/25/2016   ID:  [name], DOB 1956-03-21, MRN [id number]  PCP:  Nicoletta Dress, MD  Cardiologist:  Dr Haroldine Laws Primary Electrophysiologist: Thompson Grayer, MD    Chief Complaint  Patient presents with  . Atrial Fibrillation     History of Present Illness: [name] is a 61 y.o. female who presents today for electrophysiology evaluation.   She is doing well at this time.  SOB is at its baseline. Today, she denies symptoms of chest pain, orthopnea, PND, lower extremity edema, claudication,   presyncope, syncope, bleeding, or neurologic sequela. The patient is tolerating medications without difficulties and is otherwise without complaint today.    Past Medical History:  Diagnosis Date  . AICD (automatic cardioverter/defibrillator) present    st jude  . Anxiety   . 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 Asthma   . CHF (congestive heart failure) ([place])   . COPD (chronic obstructive pulmonary disease) ([place])   . Depression    followed by a psychiatrist  . DYSLIPIDEMIA 05/01/2009  . Dysrhythmia    atrial fibrillation  . Family history of breast cancer   . Family history of colon cancer   . Family history of uterine cancer   . Gastroparesis   . GERD (gastroesophageal reflux disease)   . Headache   . History of breast cancer 2006   with return in 2008, s/p mastectomy, XRT and chemotherapy  . Hypertension   . 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 IBS (irritable bowel syndrome)   . ICD (implantable cardiac defibrillator) in place 11/10/2012   ICD (11/2012)  . Malignant neoplasm of lower-inner quadrant of left female breast ([place]) 06/26/2016  . Myocardial infarction   . OBESITY 07/24/2007  . OBSTRUCTIVE SLEEP APNEA 07/24/2007   with CPAP compliance  . Osteoarthritis   . Persistent atrial fibrillation ([place]) 09/2014  . Presence of permanent cardiac pacemaker   . RESTLESS LEG SYNDROME 07/24/2007  . SYSTOLIC HEART FAILURE, CHRONIC 01/05/2009   a. chemo induced cardiomyopathy b. cMRI (02/2009) EF 45% c. Myoview 05/2010: EF 59%, no ischemia d. RHC (01/2012) RA 20, RV 55/13/22, PA 56/34 (44), PCWP 28, Fick CO/CI: 3.1 / 1.5, PVR 5.3 WU, PA sat 42% & 48% e. ECHO (10/2012) EF 20-25%, diff HK, MV calcified annulus f. ECHO (03/2014) EF 30-35%, sev HK, inferior/inferoseptal walls, mild MR   Past Surgical History:  Procedure Laterality Date  . ABDOMINAL HYSTERECTOMY  1991  . BI-VENTRICULAR IMPLANTABLE CARDIOVERTER DEFIBRILLATOR N/A 11/10/2012   SJM Forify Assura single chamber ICD  . BREAST LUMPECTOMY WITH RADIOACTIVE SEED AND SENTINEL LYMPH NODE BIOPSY Left 07/24/2016   Procedure: LEFT BREAST LUMPECTOMY WITH RADIOACTIVE SEED AND SENTINEL LYMPH NODE BIOPSY WITH BLUE DYE INJECTION;  Surgeon: Fanny Skates, MD;  Location: [facility];  Service: General;  Laterality: Left;  . CARDIAC CATHETERIZATION N/A 08/21/2015   Procedure: Right/Left Heart Cath and Coronary Angiography;  Surgeon: Jolaine Artist, MD;  Location: [facility];  Service: Cardiovascular;  Laterality: N/A;  . CARDIAC DEFIBRILLATOR PLACEMENT  11/10/2012   SJM Fortify Assura VR implanted by Dr Rayann Heman for primary prevention  . CHOLECYSTECTOMY    . COLONOSCOPY N/A 11/17/2015   Procedure: COLONOSCOPY;  Surgeon: Jerene Bears, MD;  Location: [facility] ENDOSCOPY;  Service: Endoscopy;  Laterality: N/A;  . ENTEROSCOPY N/A 03/26/2016   Procedure: ENTEROSCOPY;  Surgeon: Manus Gunning, MD;  Location: [facility];  Service: Gastroenterology;  Laterality: N/A;  . ESOPHAGOGASTRODUODENOSCOPY N/A 11/16/2015   Procedure: ESOPHAGOGASTRODUODENOSCOPY (  EGD);  Surgeon: Manus Gunning, MD;  Location: [place];  Service: Gastroenterology;  Laterality: N/A;  . ESOPHAGOGASTRODUODENOSCOPY N/A 02/24/2016   Procedure: ESOPHAGOGASTRODUODENOSCOPY (EGD);  Surgeon: Milus Banister, MD;  Location: [place];  Service: Endoscopy;  Laterality: N/A;  . ESOPHAGOGASTRODUODENOSCOPY N/A 03/26/2016   Procedure:  ESOPHAGOGASTRODUODENOSCOPY (EGD);  Surgeon: Manus Gunning, MD;  Location: [facility];  Service: Gastroenterology;  Laterality: N/A;  . EYE SURGERY     cataracts  . GIVENS CAPSULE STUDY N/A 12/11/2015   Procedure: GIVENS CAPSULE STUDY;  Surgeon: Manus Gunning, MD;  Location: [place];  Service: Gastroenterology;  Laterality: N/A;  . KNEE CARTILAGE SURGERY    . MASTECTOMY     right  . WRIST SURGERY Bilateral      Current Outpatient Prescriptions  Medication Sig Dispense Refill  . acetaminophen (TYLENOL) 650 MG CR tablet Take 1,300 mg by mouth every 8 (eight) hours as needed for pain.    Marland Kitchen albuterol (PROVENTIL HFA) 108 (90 BASE) MCG/ACT inhaler Inhale 2 puffs into the lungs every 6 (six) hours as needed. For shortness of breath    . ALPRAZolam (XANAX) 0.5 MG tablet Take 0.25 mg by mouth 4 (four) times daily as needed for anxiety.     Marland Kitchen amiodarone (PACERONE) 200 MG tablet Take 1 tablet (200 mg total) by mouth daily. 90 tablet 3  . apixaban (ELIQUIS) 5 MG TABS tablet Take 1 tablet (5 mg total) by mouth 2 (two) times daily. 180 tablet 3  . baclofen (LIORESAL) 20 MG tablet Take 20 mg by mouth 2 (two) times daily as needed for muscle spasms.    . busPIRone (BUSPAR) 30 MG tablet Take 30 mg by mouth 2 (two) times daily.     . carvedilol (COREG) 6.25 MG tablet Take 1 tablet (6.25 mg total) by mouth 2 (two) times daily with a meal. 180 tablet 3  . digoxin (LANOXIN) 0.125 MG tablet Take 1 tablet (0.125 mg total) by mouth every other day. 45 tablet 3  . docusate sodium (COLACE) 100 MG capsule Take 3 capsules (300 mg total) by mouth at bedtime. 10 capsule 0  . DULoxetine (CYMBALTA) 30 MG capsule Take 30 mg by mouth daily.    . furosemide (LASIX) 20 MG tablet TAKE 3 TABLETS BY MOUTH 2  TIMES DAILY. IF 1 TO 2  POUND WEIGHT GAIN IN A DAY, TAKE EXTRA 20 MG DOSE 540 tablet 3  . Garlic [id number] MG CAPS Take 500 mg by mouth daily after lunch.     Marland Kitchen HUMALOG KWIKPEN 100 UNIT/ML KiwkPen Inject  14-24 Units into the skin 3 (three) times daily. On average per patient 24 unit with breakfast, 16 units with lunch, & 14-16 units with supper    . HUMALOG MIX 75/25 KWIKPEN (75-25) 100 UNIT/ML Kwikpen Inject 75 Units into the skin 2 (two) times daily.    . hydrOXYzine (ATARAX/VISTARIL) 50 MG tablet Take 50 mg by mouth every 6 (six) hours as needed for itching ((primarily taken at bedtime)).     . IRON PO Take 24 mg by mouth daily.    . ivabradine (CORLANOR) 5 MG TABS tablet Take 1 tablet (5 mg total) by mouth 2 (two) times daily with a meal. 180 tablet 3  . levothyroxine (SYNTHROID, LEVOTHROID) 75 MCG tablet Take 75 mcg by mouth daily before breakfast.     . meclizine (ANTIVERT) 12.5 MG tablet Take 1 tablet (12.5 mg total) by mouth 3 (three) times daily as needed for dizziness. [place]  tablet 0  . Melatonin 1 MG TABS Take 0.5 mg by mouth at bedtime.    . metolazone (ZAROXOLYN) 2.5 MG tablet Take 1 tablet (2.5 mg total) by mouth as needed (for weight gain >3 lbs). 45 tablet 3  . mometasone (NASONEX) 50 MCG/ACT nasal spray Place 1 spray into both nostrils daily. allergies    . Multiple Minerals (CALCIUM-MAGNESIUM-ZINC) TABS Take 1 tablet by mouth daily after lunch.     . nitroGLYCERIN (NITROSTAT) 0.4 MG SL tablet Place 1 tablet (0.4 mg total) under the tongue every 5 (five) minutes as needed. For chest pain 25 tablet 3  . Omega-3 Fatty Acids (FISH OIL) 1000 MG CAPS Take 2 capsules by mouth daily.    . pantoprazole (PROTONIX) 40 MG tablet Take 40 mg by mouth 2 (two) times daily.      . polyethylene glycol powder (GLYCOLAX/MIRALAX) powder Mix 17 grams in 8 oz of water twice daily 850 g 3  . potassium chloride SA (K-DUR,KLOR-CON) 20 MEQ tablet Take 4 tabs in AM and 3 tabs in PM 36 tablet 0  . Propylene Glycol (SYSTANE BALANCE) 0.6 % SOLN Place 1 drop into both eyes 3 (three) times daily.    Marland Kitchen pyridOXINE (VITAMIN B-6) 100 MG tablet Take 100 mg by mouth daily after lunch.     Marland Kitchen rOPINIRole (REQUIP) 1 MG tablet  Take 1 mg by mouth at bedtime and may repeat dose one time if needed. IF RESTLESS LEGS PERSIST PATIENT MAY REPEAT ADDITIONAL DOSE    . rosuvastatin (CRESTOR) 10 MG tablet Take 10 mg by mouth at bedtime.    . sacubitril-valsartan (ENTRESTO) 24-26 MG Take 1 tablet by mouth 2 (two) times daily. 60 tablet 3  . Simethicone Extra Strength 125 MG CAPS Take 1 tablet by mouth daily as needed (for bloating). Reported on 02/23/2016    . spironolactone (ALDACTONE) 25 MG tablet Take 1 tablet (25 mg total) by mouth daily. 90 tablet 3  . SUMAtriptan (IMITREX) 100 MG tablet Take 100 mg by mouth daily as needed for migraine.     . tiotropium (SPIRIVA) 18 MCG inhalation capsule Place 18 mcg into inhaler and inhale daily.      . vitamin B-12 (CYANOCOBALAMIN) 1000 MCG tablet Take 1,000 mcg by mouth daily after lunch.     . vitamin C (ASCORBIC ACID) 500 MG tablet Take 500 mg by mouth daily after lunch.     . vitamin E 400 UNIT capsule Take 400 Units by mouth daily after lunch.      No current facility-administered medications for this visit.     Allergies:   Bee venom; Aspirin; Ciprofloxacin; Codeine; Naproxen; Other; Sulfonamide derivatives; Xarelto [rivaroxaban]; Atorvastatin; Nsaids; Tape; and Tresiba flextouch [insulin degludec]   Social History:  The patient  reports that she quit smoking about 15 years ago. She has never used smokeless tobacco. She reports that she does not drink alcohol or use drugs.   Family History:  The patient's  family history includes Breast cancer (age of onset: 51) in her cousin; Breast cancer (age of onset: 32) in her maternal grandmother; Colon cancer in her maternal aunt; Colon cancer (age of onset: 71) in her maternal aunt; Colon cancer (age of onset: 68) in her cousin; Congestive Heart Failure in her mother; Coronary artery disease in her father; Diabetes Mellitus II in her father and mother; Hypertension in her father; Lung cancer in her maternal aunt and mother; Uterine cancer in  her cousin and cousin; Uterine cancer (age  of onset: 87) in her cousin; Uterine cancer (age of onset: 56) in her maternal aunt; Uterine cancer (age of onset: 31) in her maternal aunt; Uterine cancer (age of onset: 1) in her maternal aunt.    ROS:  Please see the history of present illness.   All other systems are reviewed and negative.    PHYSICAL EXAM: VS:  BP (!) 100/58   Pulse 95   Ht 5\' 5"  (1.651 m)   Wt 221 lb (100.2 kg)   BMI 36.78 kg/m  , BMI Body mass index is 36.78 kg/m. GEN: overweight, appears older that stated age, in no acute distress  HEENT: normal  Neck: no JVD, carotid bruits, or masses Cardiac: reguar rhythm; no murmurs, rubs, or gallops,+1 edema  Respiratory:  clear to auscultation bilaterally, normal work of breathing GI: soft, nontender, nondistended, + BS MS: diffuse muscle atrophy  Skin: warm and dry  Neuro:  Strength and sensation are intact Psych: euthymic mood, full affect Skin- ICD pocket is well healed  Recent Labs: 01/02/2016: Magnesium 2.1 07/23/2016: B Natriuretic Peptide 182.4; Hemoglobin 11.0; Platelets 274 09/10/2016: ALT 15; BUN 17; Creatinine, Ser 1.11; Potassium 3.3; Sodium 135; TSH 3.795    Lipid Panel     Component Value Date/Time   CHOL 147 08/19/2015 2035   TRIG 297 (H) 08/19/2015 2035   HDL 37 (L) 08/19/2015 2035   CHOLHDL 4.0 08/19/2015 2035   VLDL 59 (H) 08/19/2015 2035   LDLCALC 51 08/19/2015 2035     Wt Readings from Last 3 Encounters:  09/25/16 221 lb (100.2 kg)  09/10/16 212 lb 8 oz (96.4 kg)  08/08/16 222 lb 3.2 oz (100.8 kg)      Other studies Reviewed: Additional studies/ records that were reviewed today include: CHF clinic notes   ASSESSMENT AND PLAN:  1.  afib Currently controlled This patients CHA2DS2-VASc Score and unadjusted Ischemic Stroke Rate (% per year) is equal to 3.2 % stroke rate/year from a score of 3 Continue on anticoagulation 2/18 labs reviewed  2. Chronic systolic dysfunction euvolemic  today coreview is stable Normal ICD function See Pace Art report No changes today Will enroll in ICM device clinic  3. OSA Compliance with CPAP advised  4. Obesity Weight loss advised  Follow closely CHF clinic I will follow remotely with Merlin Return to see EP NP in 1 year     Signed, Thompson Grayer, MD  09/25/2016   [facility] [address] [place] [address] [phone] (office) [phone] (fax)

## 2016-09-25 NOTE — Progress Notes (Signed)
CLINIC:  Survivorship   REASON FOR VISIT:  Routine follow-up for history of breast cancer.   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 The Hereditary Gene Panel offered by [facility] includes sequencing and/or deletion duplication testing of the following 43 genes: APC, ATM, AXIN2, BARD1, BMPR1A, BRCA1, BRCA2, BRIP1, CDH1, CDKN2A (p14ARF), CDKN2A (p16INK4a), CHEK2, DICER1, EPCAM (Deletion/duplication testing only), GREM1 (promoter region deletion/duplication testing only), KIT, MEN1, MLH1, MSH2, MSH6, MUTYH, NBN, NF1, PALB2, PDGFRA, PMS2, POLD1, POLE, PTEN, RAD50, RAD51C, RAD51D, SDHB, SDHC, SDHD, SMAD4, SMARCA4. STK11, TP53, TSC1, TSC2, and VHL.  The following gene was evaluated for sequence changes only: SDHA and HOXB13 c.251G>A variant only.         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  Overall, she reports feeling quite well. She was diagnosed with malignant spindle cell carcinoma, and has undergone lumpectomy and radiation.  She is tolerating this well.  She does feel off balanced on occasion and notes her blood pressure has been lower than usual.  She is planning on contacting Dr. Gala Romney about this today.      REVIEW OF SYSTEMS:  Breast: Denies any new nodularity, masses, tenderness, nipple changes, or nipple discharge.  A 14-point review of systems was completed and was negative, except as noted above.    PAST MEDICAL/SURGICAL HISTORY:  Past Medical History:  Diagnosis Date  . AICD (automatic cardioverter/defibrillator) present    st jude  . Anxiety   . 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 Asthma   . CHF (congestive heart failure) (HCC)   . COPD (chronic obstructive pulmonary disease) (HCC)   . Depression    followed by a psychiatrist  . DYSLIPIDEMIA 05/01/2009  . Dysrhythmia    atrial fibrillation  . Family history of breast cancer   . Family history of colon cancer   . Family history of uterine cancer   . Gastroparesis   . GERD (gastroesophageal reflux disease)   .  Headache   . History of breast cancer 2006   with return in 2008, s/p mastectomy, XRT and chemotherapy  . Hypertension   . 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 IBS (irritable bowel syndrome)   . ICD (implantable cardiac defibrillator) in place 11/10/2012   ICD (11/2012)  . Malignant neoplasm of lower-inner quadrant of left female breast ([place]) 06/26/2016  . Myocardial infarction   . OBESITY 07/24/2007  . OBSTRUCTIVE SLEEP APNEA 07/24/2007   with CPAP compliance  . Osteoarthritis   . Persistent atrial fibrillation ([place]) 09/2014  . Presence of permanent cardiac pacemaker   . RESTLESS  LEG SYNDROME 07/24/2007  . SYSTOLIC HEART FAILURE, CHRONIC 01/05/2009   a. chemo induced cardiomyopathy b. cMRI (02/2009) EF 45% c. Myoview 05/2010: EF 59%, no ischemia d. RHC (01/2012) RA 20, RV 55/13/22, PA 56/34 (44), PCWP 28, Fick CO/CI: 3.1 / 1.5, PVR 5.3 WU, PA sat 42% & 48% e. ECHO (10/2012) EF 20-25%, diff HK, MV calcified annulus f. ECHO (03/2014) EF 30-35%, sev HK, inferior/inferoseptal walls, mild MR   Past Surgical History:  Procedure Laterality Date  . ABDOMINAL HYSTERECTOMY  1991  . BI-VENTRICULAR IMPLANTABLE CARDIOVERTER DEFIBRILLATOR N/A 11/10/2012   SJM Forify Assura single chamber ICD  . BREAST LUMPECTOMY WITH RADIOACTIVE SEED AND SENTINEL LYMPH NODE BIOPSY Left 07/24/2016   Procedure: LEFT BREAST LUMPECTOMY WITH RADIOACTIVE SEED AND SENTINEL LYMPH NODE BIOPSY WITH BLUE DYE INJECTION;  Surgeon: Fanny Skates, MD;  Location: [facility];  Service: General;  Laterality: Left;  . CARDIAC CATHETERIZATION N/A 08/21/2015   Procedure: Right/Left Heart Cath and Coronary Angiography;  Surgeon: Jolaine Artist, MD;  Location: [place] [facility];  Service: Cardiovascular;  Laterality: N/A;  . CARDIAC DEFIBRILLATOR PLACEMENT  11/10/2012   SJM Fortify Assura VR implanted by Dr Rayann Heman for primary prevention  . CHOLECYSTECTOMY    . COLONOSCOPY N/A 11/17/2015   Procedure: COLONOSCOPY;  Surgeon: Jerene Bears, MD;  Location: [facility] ENDOSCOPY;  Service: Endoscopy;  Laterality: N/A;  . ENTEROSCOPY N/A 03/26/2016   Procedure: ENTEROSCOPY;  Surgeon: Manus Gunning, MD;  Location: [facility] ENDOSCOPY;  Service: Gastroenterology;  Laterality: N/A;  . ESOPHAGOGASTRODUODENOSCOPY N/A 11/16/2015   Procedure: ESOPHAGOGASTRODUODENOSCOPY (EGD);  Surgeon: Manus Gunning, MD;  Location: [facility];  Service: Gastroenterology;  Laterality: N/A;  . ESOPHAGOGASTRODUODENOSCOPY N/A 02/24/2016   Procedure: ESOPHAGOGASTRODUODENOSCOPY (EGD);  Surgeon: Milus Banister, MD;  Location: [facility];  Service: Endoscopy;   Laterality: N/A;  . ESOPHAGOGASTRODUODENOSCOPY N/A 03/26/2016   Procedure: ESOPHAGOGASTRODUODENOSCOPY (EGD);  Surgeon: Manus Gunning, MD;  Location: [facility];  Service: Gastroenterology;  Laterality: N/A;  . EYE SURGERY     cataracts  . GIVENS CAPSULE STUDY N/A 12/11/2015   Procedure: GIVENS CAPSULE STUDY;  Surgeon: Manus Gunning, MD;  Location: [facility];  Service: Gastroenterology;  Laterality: N/A;  . KNEE CARTILAGE SURGERY    . MASTECTOMY     right  . WRIST SURGERY Bilateral      ALLERGIES:  Allergies  Allergen Reactions  . Bee Venom Anaphylaxis  . Aspirin Hives  . Ciprofloxacin Hives  . Codeine Nausea Only    "can take ONLY if she eats with med"  . Naproxen Hives  . Other Other (See Comments)    ANY TYPES OF METAL-CAUSES BLISTERS SKIN AND ITCHING   . Sulfonamide Derivatives Hives  . Xarelto [Rivaroxaban] Hives  . Atorvastatin Itching  . Nsaids Itching and Rash  . Tape Rash    Also allergic to metal  . Tyler Aas Flextouch [Insulin Degludec]  Itching and Rash     CURRENT MEDICATIONS:  Outpatient Encounter Prescriptions as of 09/27/2016  Medication Sig Note  . acetaminophen (TYLENOL) 650 MG CR tablet Take 1,300 mg by mouth every 8 (eight) hours as needed for pain.   Marland Kitchen albuterol (PROVENTIL HFA) 108 (90 BASE) MCG/ACT inhaler Inhale 2 puffs into the lungs every 6 (six) hours as needed. For shortness of breath   . ALPRAZolam (XANAX) 0.5 MG tablet Take 0.25 mg by mouth 4 (four) times daily as needed for anxiety.    Marland Kitchen amiodarone (PACERONE) 200 MG tablet Take 1 tablet (200 mg total) by mouth daily.   Marland Kitchen apixaban (ELIQUIS) 5 MG TABS tablet Take 1 tablet (5 mg total) by mouth 2 (two) times daily.   . baclofen (LIORESAL) 20 MG tablet Take 20 mg by mouth 2 (two) times daily as needed for muscle spasms.   . busPIRone (BUSPAR) 30 MG tablet Take 30 mg by mouth 2 (two) times daily.    . carvedilol (COREG) 6.25 MG tablet Take 1 tablet (6.25 mg total) by mouth 2 (two)  times daily with a meal.   . digoxin (LANOXIN) 0.125 MG tablet Take 1 tablet (0.125 mg total) by mouth every other day.   . docusate sodium (COLACE) 100 MG capsule Take 3 capsules (300 mg total) by mouth at bedtime.   . DULoxetine (CYMBALTA) 30 MG capsule Take 30 mg by mouth daily.   . furosemide (LASIX) 20 MG tablet TAKE 3 TABLETS BY MOUTH 2  TIMES DAILY. IF 1 TO 2  POUND WEIGHT GAIN IN A DAY, TAKE EXTRA 20 MG DOSE   . Garlic 616 MG CAPS Take 500 mg by mouth daily after lunch.    Marland Kitchen HUMALOG KWIKPEN 100 UNIT/ML KiwkPen Inject 14-24 Units into the skin 3 (three) times daily. On average per patient 24 unit with breakfast, 16 units with lunch, & 14-16 units with supper   . HUMALOG MIX 75/25 KWIKPEN (75-25) 100 UNIT/ML Kwikpen Inject 75 Units into the skin 2 (two) times daily. 07/24/2016: 52 units   . hydrOXYzine (ATARAX/VISTARIL) 50 MG tablet Take 50 mg by mouth every 6 (six) hours as needed for itching ((primarily taken at bedtime)).    . IRON PO Take 24 mg by mouth daily.   . ivabradine (CORLANOR) 5 MG TABS tablet Take 1 tablet (5 mg total) by mouth 2 (two) times daily with a meal.   . levothyroxine (SYNTHROID, LEVOTHROID) 75 MCG tablet Take 75 mcg by mouth daily before breakfast.    . Melatonin 1 MG TABS Take 0.5 mg by mouth at bedtime.   . metolazone (ZAROXOLYN) 2.5 MG tablet Take 1 tablet (2.5 mg total) by mouth as needed (for weight gain >3 lbs).   . mometasone (NASONEX) 50 MCG/ACT nasal spray Place 1 spray into both nostrils daily. allergies   . Multiple Minerals (CALCIUM-MAGNESIUM-ZINC) TABS Take 1 tablet by mouth daily after lunch.    . Omega-3 Fatty Acids (FISH OIL) 1000 MG CAPS Take 2 capsules by mouth daily.   . pantoprazole (PROTONIX) 40 MG tablet Take 40 mg by mouth 2 (two) times daily.     . polyethylene glycol powder (GLYCOLAX/MIRALAX) powder Mix 17 grams in 8 oz of water twice daily   . potassium chloride SA (K-DUR,KLOR-CON) 20 MEQ tablet Take 4 tabs in AM and 3 tabs in PM   .  Propylene Glycol (SYSTANE BALANCE) 0.6 % SOLN Place 1 drop into both eyes 3 (three) times daily.   Marland Kitchen pyridOXINE (  VITAMIN B-6) 100 MG tablet Take 100 mg by mouth daily after lunch.    Marland Kitchen rOPINIRole (REQUIP) 1 MG tablet Take 1 mg by mouth at bedtime and may repeat dose one time if needed. IF RESTLESS LEGS PERSIST PATIENT MAY REPEAT ADDITIONAL DOSE   . rosuvastatin (CRESTOR) 10 MG tablet Take 10 mg by mouth at bedtime.   . sacubitril-valsartan (ENTRESTO) 24-26 MG Take 1 tablet by mouth 2 (two) times daily.   . Simethicone Extra Strength 125 MG CAPS Take 1 tablet by mouth daily as needed (for bloating). Reported on 02/23/2016   . spironolactone (ALDACTONE) 25 MG tablet Take 1 tablet (25 mg total) by mouth daily.   Marland Kitchen tiotropium (SPIRIVA) 18 MCG inhalation capsule Place 18 mcg into inhaler and inhale daily.     . vitamin B-12 (CYANOCOBALAMIN) 1000 MCG tablet Take 1,000 mcg by mouth daily after lunch.    . vitamin C (ASCORBIC ACID) 500 MG tablet Take 500 mg by mouth daily after lunch.    . vitamin E 400 UNIT capsule Take 400 Units by mouth daily after lunch.    . meclizine (ANTIVERT) 12.5 MG tablet Take 1 tablet (12.5 mg total) by mouth 3 (three) times daily as needed for dizziness. (Patient not taking: Reported on 09/27/2016)   . nitroGLYCERIN (NITROSTAT) 0.4 MG SL tablet Place 1 tablet (0.4 mg total) under the tongue every 5 (five) minutes as needed. For chest pain (Patient not taking: Reported on 09/27/2016)   . SUMAtriptan (IMITREX) 100 MG tablet Take 100 mg by mouth daily as needed for migraine.    . [DISCONTINUED] amoxicillin (AMOXIL) 500 MG tablet Take 500 mg by mouth 2 (two) times daily. For 10 days    No facility-administered encounter medications on file as of 09/27/2016.      ONCOLOGIC FAMILY HISTORY:  Family History  Problem Relation Age of Onset  . Diabetes Mellitus II Mother   . Lung cancer Mother   . Congestive Heart Failure Mother   . Coronary artery disease Father     CABG x 3  .  Hypertension Father   . Diabetes Mellitus II Father   . Breast cancer Cousin 69    maternal first cousin  . Colon cancer Cousin 67    maternal first cousin  . Uterine cancer Cousin     dx in her 20s  . Colon cancer Maternal Aunt     dx in her late 84s  . Uterine cancer Maternal Aunt 66  . Lung cancer Maternal Aunt   . Uterine cancer Maternal Aunt 38  . Colon cancer Maternal Aunt 73  . Breast cancer Maternal Grandmother 2  . Uterine cancer Maternal Aunt 48  . Uterine cancer Cousin     dx in her 10s  . Uterine cancer Cousin 19      SOCIAL HISTORY:  [name] is single and lives alone in [place], [place].  Her daughter lives down the street.  She denies any current or history of tobacco, alcohol, or illicit drug use.     PHYSICAL EXAMINATION:  Vital Signs: Vitals:   09/27/16 1221  BP: (!) 98/44  Pulse: 84  Resp: 17  Temp: 97.5 F (36.4 C)   Filed Weights   09/27/16 1221  Weight: 219 lb (99.3 kg)   General: Well-nourished, female in no acute distress.  Unaccompanied today.   HEENT: Head is normocephalic.  Pupils equal and reactive to light. Conjunctivae clear without exudate.  Sclerae anicteric. Oral mucosa is  pink, moist.  Oropharynx is pink without lesions or erythema.  Lymph: No cervical, supraclavicular, or infraclavicular lymphadenopathy noted on palpation.  Cardiovascular: Regular rate and rhythm.Marland Kitchen Respiratory: Clear to auscultation bilaterally. Chest expansion symmetric; breathing non-labored.  -Axilla: No axillary adenopathy bilaterally.  GI: Abdomen soft and round; non-tender, non-distended. Bowel sounds normoactive. No hepatosplenomegaly.   GU: Deferred.  Neuro: No focal deficits. Steady gait.  Psych: Mood and affect normal and appropriate for situation.  Extremities: left lower extremity with edema Skin: Warm and dry.  LABORATORY DATA:  None for this visit    ASSESSMENT AND PLAN:  Ms.. Pickney is a pleasant 60 y.o. female with  history of Stage IA right invasive ductal carcinoma, ER-/PR-/HER2-, diagnosed in 2005, recurrent in 2008, treated initially in 2005 with lumpectomy, adjuvant chemotherapy, and adjuvant radiation therapy followed by mastectomy in 2008.  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    1. History of breast cancer:  Ms. Camuso is currently undergoing active treatment for her breast cancer.  Due to this she is not a candidate for long term survivorship visits, however she does sound like she is tolerating treatment well.    2. Dizziness and mild hypotension: Patient to contact Dr. Haroldine Laws about her symptoms and for guidance about her anti-hypertensive medications.  3. Health maintenance and wellness promotion: Ms. Persing was encouraged to consume 5-7 servings of fruits and vegetables per day. She was also encouraged to engage in physical activity in moderation when able (cardiac history). She was instructed to limit her alcohol consumption and continue to abstain from tobacco use.    Dispo:  -Return to cancer center as scheduled with Dr. Lindi Adie and for her radiation treatments   A total of (20) minutes of face-to-face time was spent with this patient with greater than 50% of that time in counseling and care-coordination.   Charlestine Massed, NP Survivorship [facility] [place] [phone]   Note: PRIMARY CARE PROVIDER Nicoletta Dress, [place] [phone]

## 2016-09-25 NOTE — Patient Instructions (Addendum)
Medication Instructions:  Your physician recommends that you continue on your current medications as directed. Please refer to the Current Medication list given to you today.   Labwork: None ordered   Testing/Procedures: None ordered   Follow-Up: You will receive a phone call from Sharman Cheek, RN ICM nurse to follow your device monthly  Your physician wants you to follow-up in: 12 months with Joanne Marshall, NP You will receive a reminder letter in the mail two months in advance. If you don't receive a letter, please call our office to schedule the follow-up appointment.  Remote monitoring is used to monitor your ICD from home. This monitoring reduces the number of office visits required to check your device to one time per year. It allows [place] to keep an eye on the functioning of your device to ensure it is working properly. You are scheduled for a device check from home on 12/25/16. You may send your transmission at any time that day. If you have a wireless device, the transmission will be sent automatically. After your physician reviews your transmission, you will receive a postcard with your next transmission date.   Low-Sodium Eating Plan Sodium raises blood pressure and causes water to be held in the body. Getting less sodium from food will help lower your blood pressure, reduce any swelling, and protect your heart, liver, and kidneys. We get sodium by adding salt (sodium chloride) to food. Most of our sodium comes from canned, boxed, and frozen foods. Restaurant foods, fast foods, and pizza are also very high in sodium. Even if you take medicine to lower your blood pressure or to reduce fluid in your body, getting less sodium from your food is important. What is my plan? Most people should limit their sodium intake to 2,300 mg a day. Your health care provider recommends that you limit your sodium intake to 2,000 mg a day. What do I need to know about this eating plan For the low-sodium  eating plan, you will follow these general guidelines:  Choose foods with a % Daily Value for sodium of less than 5% (as listed on the food label).  Use salt-free seasonings or herbs instead of table salt or sea salt.  Check with your health care provider or pharmacist before using salt substitutes.  Eat fresh foods.  Eat more vegetables and fruits.  Limit canned vegetables. If you do use them, rinse them well to decrease the sodium.  Limit cheese to 1 oz (28 g) per day.  Eat lower-sodium products, often labeled as "lower sodium" or "no salt added."  Avoid foods that contain monosodium glutamate (MSG). MSG is sometimes added to [place] food and some canned foods.  Check food labels (Nutrition Facts labels) on foods to learn how much sodium is in one serving.  Eat more home-cooked food and less restaurant, buffet, and fast food.  When eating at a restaurant, ask that your food be prepared with less salt, or no salt if possible. How do I read food labels for sodium information? The Nutrition Facts label lists the amount of sodium in one serving of the food. If you eat more than one serving, you must multiply the listed amount of sodium by the number of servings. Food labels may also identify foods as:  Sodium free-Less than 5 mg in a serving.  Very low sodium-35 mg or less in a serving.  Low sodium-140 mg or less in a serving.  Light in sodium-50% less sodium in a serving. For example,  if a food that usually has 300 mg of sodium is changed to become light in sodium, it will have 150 mg of sodium.  Reduced sodium-25% less sodium in a serving. For example, if a food that usually has 400 mg of sodium is changed to reduced sodium, it will have 300 mg of sodium. What foods can I eat? Grains  Low-sodium cereals, including oats, puffed wheat and rice, and shredded wheat cereals. Low-sodium crackers. Unsalted rice and pasta. Lower-sodium bread. Vegetables  Frozen or fresh vegetables.  Low-sodium or reduced-sodium canned vegetables. Low-sodium or reduced-sodium tomato sauce and paste. Low-sodium or reduced-sodium tomato and vegetable juices. Fruits  Fresh, frozen, and canned fruit. Fruit juice. Meat and Other Protein Products  Low-sodium canned tuna and salmon. Fresh or frozen meat, poultry, seafood, and fish. Lamb. Unsalted nuts. Dried beans, peas, and lentils without added salt. Unsalted canned beans. Homemade soups without salt. Eggs. Dairy  Milk. Soy milk. Ricotta cheese. Low-sodium or reduced-sodium cheeses. Yogurt. Condiments  Fresh and dried herbs and spices. Salt-free seasonings. Onion and garlic powders. Low-sodium varieties of mustard and ketchup. Fresh or refrigerated horseradish. Lemon juice. Fats and Oils  Reduced-sodium salad dressings. Unsalted butter. Other  Unsalted popcorn and pretzels. The items listed above may not be a complete list of recommended foods or beverages. Contact your dietitian for more options.  What foods are not recommended? Grains  Instant hot cereals. Bread stuffing, pancake, and biscuit mixes. Croutons. Seasoned rice or pasta mixes. Noodle soup cups. Boxed or frozen macaroni and cheese. Self-rising flour. Regular salted crackers. Vegetables  Regular canned vegetables. Regular canned tomato sauce and paste. Regular tomato and vegetable juices. Frozen vegetables in sauces. Salted [place] fries. Olives. Angie Fava. Relishes. Sauerkraut. Salsa. Meat and Other Protein Products  Salted, canned, smoked, spiced, or pickled meats, seafood, or fish. Bacon, ham, sausage, hot dogs, corned beef, chipped beef, and packaged luncheon meats. Salt pork. Jerky. Pickled herring. Anchovies, regular canned tuna, and sardines. Salted nuts. Dairy  Processed cheese and cheese spreads. Cheese curds. Blue cheese and cottage cheese. Buttermilk. Condiments  Onion and garlic salt, seasoned salt, table salt, and sea salt. Canned and packaged gravies. Worcestershire  sauce. Tartar sauce. Barbecue sauce. Teriyaki sauce. Soy sauce, including reduced sodium. Steak sauce. Fish sauce. Oyster sauce. Cocktail sauce. Horseradish that you find on the shelf. Regular ketchup and mustard. Meat flavorings and tenderizers. Bouillon cubes. Hot sauce. Tabasco sauce. Marinades. Taco seasonings. Relishes. Fats and Oils  Regular salad dressings. Salted butter. Margarine. Ghee. Bacon fat. Other  Potato and tortilla chips. Corn chips and puffs. Salted popcorn and pretzels. Canned or dried soups. Pizza. Frozen entrees and pot pies. The items listed above may not be a complete list of foods and beverages to avoid. Contact your dietitian for more information.  This information is not intended to replace advice given to you by your health care provider. Make sure you discuss any questions you have with your health care provider. Document Released: 01/11/2002 Document Revised: 12/28/2015 Document Reviewed: 05/26/2013 Elsevier Interactive Patient Education  2017 [employer].       Any Other Special Instructions Will Be Listed Below (If Applicable).     If you need a refill on your cardiac medications before your next appointment, please call your pharmacy.

## 2016-09-26 ENCOUNTER — Ambulatory Visit
Admission: RE | Admit: 2016-09-26 | Discharge: 2016-09-26 | Disposition: A | Discharge status: Home / Self Care | Payer: Medicare Other | Source: Ambulatory Visit | Attending: Radiation Oncology | Admitting: Radiation Oncology

## 2016-09-26 ENCOUNTER — Ambulatory Visit: Payer: Self-pay | Admitting: *Deleted

## 2016-09-26 DIAGNOSIS — C50312 Malignant neoplasm of lower-inner quadrant of left female breast: Secondary | ICD-10-CM | POA: Diagnosis not present

## 2016-09-26 DIAGNOSIS — Z171 Estrogen receptor negative status [ER-]: Secondary | ICD-10-CM | POA: Diagnosis not present

## 2016-09-26 DIAGNOSIS — Z510 Encounter for antineoplastic radiation therapy: Secondary | ICD-10-CM | POA: Diagnosis not present

## 2016-09-26 DIAGNOSIS — R51 Headache: Secondary | ICD-10-CM | POA: Diagnosis not present

## 2016-09-26 DIAGNOSIS — R42 Dizziness and giddiness: Secondary | ICD-10-CM | POA: Diagnosis not present

## 2016-09-27 ENCOUNTER — Telehealth: Payer: Self-pay | Admitting: *Deleted

## 2016-09-27 ENCOUNTER — Ambulatory Visit (HOSPITAL_BASED_OUTPATIENT_CLINIC_OR_DEPARTMENT_OTHER): Payer: Medicare Other | Admitting: Adult Health

## 2016-09-27 ENCOUNTER — Ambulatory Visit
Admission: RE | Admit: 2016-09-27 | Discharge: 2016-09-27 | Disposition: A | Discharge status: Home / Self Care | Payer: Medicare Other | Source: Ambulatory Visit | Attending: Radiation Oncology | Admitting: Radiation Oncology

## 2016-09-27 ENCOUNTER — Encounter: Payer: Self-pay | Admitting: Adult Health

## 2016-09-27 VITALS — BP 98/44 | HR 84 | Temp 97.5°F | Resp 17 | Wt 219.0 lb

## 2016-09-27 DIAGNOSIS — R42 Dizziness and giddiness: Secondary | ICD-10-CM

## 2016-09-27 DIAGNOSIS — Z9221 Personal history of antineoplastic chemotherapy: Secondary | ICD-10-CM | POA: Diagnosis not present

## 2016-09-27 DIAGNOSIS — C50312 Malignant neoplasm of lower-inner quadrant of left female breast: Secondary | ICD-10-CM

## 2016-09-27 DIAGNOSIS — Z923 Personal history of irradiation: Secondary | ICD-10-CM | POA: Diagnosis not present

## 2016-09-27 DIAGNOSIS — Z9011 Acquired absence of right breast and nipple: Secondary | ICD-10-CM

## 2016-09-27 DIAGNOSIS — Z171 Estrogen receptor negative status [ER-]: Secondary | ICD-10-CM | POA: Diagnosis not present

## 2016-09-27 DIAGNOSIS — C50511 Malignant neoplasm of lower-outer quadrant of right female breast: Secondary | ICD-10-CM

## 2016-09-27 DIAGNOSIS — Z853 Personal history of malignant neoplasm of breast: Secondary | ICD-10-CM | POA: Diagnosis not present

## 2016-09-27 DIAGNOSIS — R51 Headache: Secondary | ICD-10-CM | POA: Diagnosis not present

## 2016-09-27 DIAGNOSIS — I959 Hypotension, unspecified: Secondary | ICD-10-CM

## 2016-09-27 DIAGNOSIS — Z510 Encounter for antineoplastic radiation therapy: Secondary | ICD-10-CM | POA: Diagnosis not present

## 2016-09-27 LAB — CUP PACEART INCLINIC DEVICE CHECK
Brady Statistic RV Percent Paced: 0 %
HighPow Impedance: 72 Ohm
Implantable Lead Implant Date: 20140408
Implantable Pulse Generator Implant Date: 20140408
Lead Channel Pacing Threshold Amplitude: 0.75 V
Lead Channel Sensing Intrinsic Amplitude: 12 mV
Lead Channel Setting Pacing Amplitude: 2.5 V
Lead Channel Setting Pacing Pulse Width: 0.5 ms
Lead Channel Setting Sensing Sensitivity: 0.5 mV
MDC IDC LEAD LOCATION: 753860
MDC IDC MSMT LEADCHNL RV IMPEDANCE VALUE: 412.5 Ohm
MDC IDC MSMT LEADCHNL RV PACING THRESHOLD PULSEWIDTH: 0.5 ms
MDC IDC SESS DTM: 20180221222929
Pulse Gen Serial Number: 7066421

## 2016-09-27 NOTE — Telephone Encounter (Signed)
Telephone call to [facility] to obtain Mammogram pt was to have completed in January 2018. Last imaging completed was 06/2016 for a diagnostic follow up mammo/US of the breast. Results are being faxed to this office now.

## 2016-09-30 ENCOUNTER — Ambulatory Visit
Admission: RE | Admit: 2016-09-30 | Discharge: 2016-09-30 | Disposition: A | Discharge status: Home / Self Care | Payer: Medicare Other | Source: Ambulatory Visit | Attending: Radiation Oncology | Admitting: Radiation Oncology

## 2016-09-30 DIAGNOSIS — R42 Dizziness and giddiness: Secondary | ICD-10-CM | POA: Diagnosis not present

## 2016-09-30 DIAGNOSIS — Z171 Estrogen receptor negative status [ER-]: Secondary | ICD-10-CM | POA: Diagnosis not present

## 2016-09-30 DIAGNOSIS — Z510 Encounter for antineoplastic radiation therapy: Secondary | ICD-10-CM | POA: Diagnosis not present

## 2016-09-30 DIAGNOSIS — R51 Headache: Secondary | ICD-10-CM | POA: Diagnosis not present

## 2016-09-30 DIAGNOSIS — C50312 Malignant neoplasm of lower-inner quadrant of left female breast: Secondary | ICD-10-CM | POA: Diagnosis not present

## 2016-10-01 ENCOUNTER — Ambulatory Visit
Admission: RE | Admit: 2016-10-01 | Discharge: 2016-10-01 | Disposition: A | Discharge status: Home / Self Care | Payer: Medicare Other | Source: Ambulatory Visit | Attending: Radiation Oncology | Admitting: Radiation Oncology

## 2016-10-01 ENCOUNTER — Encounter: Payer: Self-pay | Admitting: Radiation Oncology

## 2016-10-01 ENCOUNTER — Inpatient Hospital Stay
Admission: RE | Admit: 2016-10-01 | Discharge: 2016-10-01 | Disposition: A | Discharge status: Home / Self Care | Payer: Self-pay | Source: Ambulatory Visit | Attending: Radiation Oncology | Admitting: Radiation Oncology

## 2016-10-01 VITALS — BP 110/58 | HR 92 | Temp 97.7°F | Ht 65.0 in | Wt 218.4 lb

## 2016-10-01 DIAGNOSIS — C50312 Malignant neoplasm of lower-inner quadrant of left female breast: Secondary | ICD-10-CM

## 2016-10-01 DIAGNOSIS — C50511 Malignant neoplasm of lower-outer quadrant of right female breast: Secondary | ICD-10-CM

## 2016-10-01 DIAGNOSIS — Z171 Estrogen receptor negative status [ER-]: Secondary | ICD-10-CM | POA: Insufficient documentation

## 2016-10-01 DIAGNOSIS — R42 Dizziness and giddiness: Secondary | ICD-10-CM | POA: Diagnosis not present

## 2016-10-01 DIAGNOSIS — Z510 Encounter for antineoplastic radiation therapy: Secondary | ICD-10-CM | POA: Diagnosis not present

## 2016-10-01 DIAGNOSIS — R51 Headache: Secondary | ICD-10-CM | POA: Diagnosis not present

## 2016-10-01 MED ORDER — RADIAPLEXRX EX GEL: Freq: Once | CUTANEOUS | Status: DC

## 2016-10-01 NOTE — Progress Notes (Signed)
[name] has completed 5 fractions to her left breast.  She denies having pain other that chronic back pain.  She reports having fatigue.  The skin on her left breast is intact.  BP (!) 110/58 (BP Location: Right Arm, Patient Position: Sitting)   Pulse 92   Temp 97.7 F (36.5 C) (Oral)   Ht 5\' 5"  (1.651 m)   Wt 218 lb 6.4 oz (99.1 kg)   SpO2 100%   BMI 36.34 kg/m    Wt Readings from Last 3 Encounters:  10/01/16 218 lb 6.4 oz (99.1 kg)  09/27/16 219 lb (99.3 kg)  09/25/16 221 lb (100.2 kg)

## 2016-10-01 NOTE — Progress Notes (Signed)
  Radiation Oncology         [phone] ________________________________  Name: [name] MRN: [id number]  Date: 10/01/2016  DOB: Sep 18, 1955  Weekly Radiation Therapy Management    ICD-9-CM ICD-10-CM   1. Malignant neoplasm of lower-inner quadrant of left breast in female, estrogen receptor negative (HCC) 174.3 C50.312 hyaluronate sodium (RADIAPLEXRX) gel   V86.1 Z17.1   2. Malignant neoplasm of lower-outer quadrant of right breast of female, estrogen receptor negative (HCC) 174.5 C50.511    V86.1 Z17.1      Current Dose: 9 Gy     Planned Dose:  62.4 Gy  Narrative . . . . . . . . The patient presents for routine under treatment assessment.                                    The patient has completed 5 fractions to the left breast. She denies having pain other than chronic back pain. She reports fatigue. The nurse notes the skin of her left breast is intact.                                  Set-up films were reviewed.                                 The chart was checked. Physical Findings. . .  height is 5\' 5"  (1.651 m) and weight is 218 lb 6.4 oz (99.1 kg). Her oral temperature is 97.7 F (36.5 C). Her blood pressure is 110/58 (abnormal) and her pulse is 92. Her oxygen saturation is 100%. . Weight essentially stable. Lungs are clear to auscultation bilaterally. Heart has regular rate and rhythm. No skin reaction at this time. Impression . . . . . . . The patient is tolerating radiation. Plan . . . . . . . . . . . . Continue treatment as planned.  ________________________________   Blair Promise, PhD, MD  This document serves as a record of services personally performed by Gery Pray, MD. It was created on his behalf by Darcus Austin, a trained medical scribe. The creation of this record is based on the scribe's personal observations and the provider's statements to them. This document has been checked and approved by the attending provider.

## 2016-10-01 NOTE — Progress Notes (Signed)
Pt here for patient teaching.  Pt given Radiation and You booklet, skin care instructions and Radiaplex gel.  Reviewed areas of pertinence such as fatigue, skin changes, breast tenderness and breast swelling . Pt able to give teach back of to pat skin and use unscented/gentle soap,apply Radiaplex bid, avoid applying anything to skin within 4 hours of treatment and to use an electric razor if they must shave. Pt demonstrated understanding and verbalizes understanding of information given and will contact nursing with any questions or concerns.          

## 2016-10-02 ENCOUNTER — Ambulatory Visit
Admission: RE | Admit: 2016-10-02 | Discharge: 2016-10-02 | Disposition: A | Discharge status: Home / Self Care | Payer: Medicare Other | Source: Ambulatory Visit | Attending: Radiation Oncology | Admitting: Radiation Oncology

## 2016-10-02 DIAGNOSIS — R42 Dizziness and giddiness: Secondary | ICD-10-CM | POA: Diagnosis not present

## 2016-10-02 DIAGNOSIS — Z510 Encounter for antineoplastic radiation therapy: Secondary | ICD-10-CM | POA: Diagnosis not present

## 2016-10-02 DIAGNOSIS — Z171 Estrogen receptor negative status [ER-]: Secondary | ICD-10-CM | POA: Diagnosis not present

## 2016-10-02 DIAGNOSIS — C50312 Malignant neoplasm of lower-inner quadrant of left female breast: Secondary | ICD-10-CM | POA: Diagnosis not present

## 2016-10-02 DIAGNOSIS — R51 Headache: Secondary | ICD-10-CM | POA: Diagnosis not present

## 2016-10-02 MED ORDER — RADIAPLEXRX EX GEL
Freq: Once | CUTANEOUS | Status: AC
  Administered 2016-10-02: 08:00:00 via TOPICAL

## 2016-10-02 NOTE — Addendum Note (Signed)
Encounter addended by: Jacqulyn Liner, RN on: 10/02/2016  8:10 AM<BR>    Actions taken: Order list changed, Diagnosis association updated, MAR administration accepted

## 2016-10-03 ENCOUNTER — Ambulatory Visit (HOSPITAL_COMMUNITY)
Admission: RE | Admit: 2016-10-03 | Discharge: 2016-10-03 | Disposition: A | Discharge status: Home / Self Care | Payer: Medicare Other | Source: Ambulatory Visit | Attending: Internal Medicine | Admitting: Internal Medicine

## 2016-10-03 ENCOUNTER — Ambulatory Visit
Admission: RE | Admit: 2016-10-03 | Discharge: 2016-10-03 | Disposition: A | Discharge status: Home / Self Care | Payer: Medicare Other | Source: Ambulatory Visit | Attending: Radiation Oncology | Admitting: Radiation Oncology

## 2016-10-03 DIAGNOSIS — R51 Headache: Secondary | ICD-10-CM | POA: Diagnosis not present

## 2016-10-03 DIAGNOSIS — Z171 Estrogen receptor negative status [ER-]: Secondary | ICD-10-CM | POA: Diagnosis not present

## 2016-10-03 DIAGNOSIS — I5022 Chronic systolic (congestive) heart failure: Secondary | ICD-10-CM | POA: Insufficient documentation

## 2016-10-03 DIAGNOSIS — R42 Dizziness and giddiness: Secondary | ICD-10-CM | POA: Diagnosis not present

## 2016-10-03 DIAGNOSIS — C50312 Malignant neoplasm of lower-inner quadrant of left female breast: Secondary | ICD-10-CM | POA: Diagnosis not present

## 2016-10-03 DIAGNOSIS — Z510 Encounter for antineoplastic radiation therapy: Secondary | ICD-10-CM | POA: Diagnosis not present

## 2016-10-03 LAB — BASIC METABOLIC PANEL
Anion gap: 9 (ref 5–15)
BUN: 16 mg/dL (ref 6–20)
CHLORIDE: 101 mmol/L (ref 101–111)
CO2: 26 mmol/L (ref 22–32)
Calcium: 9.2 mg/dL (ref 8.9–10.3)
Creatinine, Ser: 1.09 mg/dL — ABNORMAL HIGH (ref 0.44–1.00)
GFR calc Af Amer: >60 mL/min (ref >60)
GFR calc non Af Amer: 54 mL/min — ABNORMAL LOW (ref >60)
GLUCOSE: 224 mg/dL — AB (ref 65–99)
POTASSIUM: 3.9 mmol/L (ref 3.5–5.1)
Sodium: 136 mmol/L (ref 135–145)

## 2016-10-04 ENCOUNTER — Ambulatory Visit
Admission: RE | Admit: 2016-10-04 | Discharge: 2016-10-04 | Disposition: A | Discharge status: Home / Self Care | Payer: Medicare Other | Source: Ambulatory Visit | Attending: Radiation Oncology | Admitting: Radiation Oncology

## 2016-10-04 DIAGNOSIS — R42 Dizziness and giddiness: Secondary | ICD-10-CM | POA: Diagnosis not present

## 2016-10-04 DIAGNOSIS — R51 Headache: Secondary | ICD-10-CM | POA: Diagnosis not present

## 2016-10-04 DIAGNOSIS — C50312 Malignant neoplasm of lower-inner quadrant of left female breast: Secondary | ICD-10-CM | POA: Diagnosis not present

## 2016-10-04 DIAGNOSIS — Z171 Estrogen receptor negative status [ER-]: Secondary | ICD-10-CM | POA: Diagnosis not present

## 2016-10-04 DIAGNOSIS — Z510 Encounter for antineoplastic radiation therapy: Secondary | ICD-10-CM | POA: Diagnosis not present

## 2016-10-05 DIAGNOSIS — R569 Unspecified convulsions: Secondary | ICD-10-CM | POA: Diagnosis not present

## 2016-10-05 DIAGNOSIS — R253 Fasciculation: Secondary | ICD-10-CM | POA: Diagnosis not present

## 2016-10-07 ENCOUNTER — Ambulatory Visit
Admission: RE | Admit: 2016-10-07 | Discharge: 2016-10-07 | Disposition: A | Discharge status: Home / Self Care | Payer: Medicare Other | Source: Ambulatory Visit | Attending: Radiation Oncology | Admitting: Radiation Oncology

## 2016-10-07 ENCOUNTER — Encounter: Payer: Medicare Other | Admitting: Adult Health

## 2016-10-07 DIAGNOSIS — R51 Headache: Secondary | ICD-10-CM | POA: Diagnosis not present

## 2016-10-07 DIAGNOSIS — C50312 Malignant neoplasm of lower-inner quadrant of left female breast: Secondary | ICD-10-CM

## 2016-10-07 DIAGNOSIS — Z510 Encounter for antineoplastic radiation therapy: Secondary | ICD-10-CM | POA: Diagnosis not present

## 2016-10-07 DIAGNOSIS — Z171 Estrogen receptor negative status [ER-]: Secondary | ICD-10-CM | POA: Diagnosis not present

## 2016-10-07 DIAGNOSIS — R42 Dizziness and giddiness: Secondary | ICD-10-CM | POA: Diagnosis not present

## 2016-10-07 MED ORDER — ALRA NON-METALLIC DEODORANT (RAD-ONC)
1.0000 "application " | Freq: Once | TOPICAL | Status: AC
  Administered 2016-10-07: 1 via TOPICAL

## 2016-10-08 ENCOUNTER — Ambulatory Visit
Admission: RE | Admit: 2016-10-08 | Discharge: 2016-10-08 | Disposition: A | Discharge status: Home / Self Care | Payer: Medicare Other | Source: Ambulatory Visit | Attending: Radiation Oncology | Admitting: Radiation Oncology

## 2016-10-08 ENCOUNTER — Encounter: Payer: Self-pay | Admitting: Radiation Oncology

## 2016-10-08 ENCOUNTER — Encounter: Payer: Self-pay | Admitting: Nurse Practitioner

## 2016-10-08 VITALS — BP 117/73 | HR 93 | Temp 98.1°F | Ht 65.0 in | Wt 218.0 lb

## 2016-10-08 DIAGNOSIS — R51 Headache: Secondary | ICD-10-CM | POA: Diagnosis not present

## 2016-10-08 DIAGNOSIS — C50312 Malignant neoplasm of lower-inner quadrant of left female breast: Secondary | ICD-10-CM

## 2016-10-08 DIAGNOSIS — C50511 Malignant neoplasm of lower-outer quadrant of right female breast: Secondary | ICD-10-CM

## 2016-10-08 DIAGNOSIS — Z510 Encounter for antineoplastic radiation therapy: Secondary | ICD-10-CM | POA: Diagnosis not present

## 2016-10-08 DIAGNOSIS — Z171 Estrogen receptor negative status [ER-]: Principal | ICD-10-CM

## 2016-10-08 DIAGNOSIS — R42 Dizziness and giddiness: Secondary | ICD-10-CM | POA: Diagnosis not present

## 2016-10-08 NOTE — Progress Notes (Signed)
[name] has completed 10 fractions to her left breast.  On arrival to the clinic she said that her blood sugar felt like it was low.  It was 51 when checked.  She was given coke and chocolate candy.  She also had ate some glucose tablets.  It was 74 when retaken.  She said she only had a cheese sandwich for lung.  She reports having fatigue.  She is using radiaplex bid.  The skin under her left arm is red.  She said she had a boil in this area before radiation and had quit using her powders when radiation started.  The skin on her left breast is intact.  BP 117/73 (BP Location: Right Arm, Patient Position: Sitting)   Pulse 93   Ht 5\' 5"  (1.651 m)   Wt 218 lb (98.9 kg)   SpO2 99%   BMI 36.28 kg/m    Wt Readings from Last 3 Encounters:  10/08/16 218 lb (98.9 kg)  10/01/16 218 lb 6.4 oz (99.1 kg)  09/27/16 219 lb (99.3 kg)

## 2016-10-08 NOTE — Progress Notes (Signed)
  Radiation Oncology         [phone] ________________________________  Name: [name] MRN: [id number]  Date: 10/08/2016  DOB: 1956-06-24  Weekly Radiation Therapy Management    ICD-9-CM ICD-10-CM   1. Malignant neoplasm of lower-inner quadrant of left breast in female, estrogen receptor negative (HCC) 174.3 C50.312 Wound culture   V86.1 Z17.1 Wound culture  2. Malignant neoplasm of lower-outer quadrant of right breast of female, estrogen receptor negative (HCC) 174.5 C50.511    V86.1 Z17.1      Current Dose: 18 Gy     Planned Dose:  62.4 Gy  Narrative . . . . . . . . The patient presents for routine under treatment assessment.                                    [name] has completed 10 fractions to her left breast.  On arrival to the clinic she said that her blood sugar felt like it was low.  It was 51 when checked.  She was given coke and chocolate candy.  She also ate some glucose tablets.  It was 33 when retaken.  She said she only had a cheese sandwich for lunch. She reports feeling better now.  She reports having fatigue.  She is using radiaplex bid.  The skin under her left arm is red.  She said she had a boil in this area before radiation and had quit using her powders when radiation started.  The skin on her left breast is intact.                                  Set-up films were reviewed.                                 The chart was checked. Physical Findings. . .  height is 5\' 5"  (1.651 m) and weight is 218 lb (98.9 kg). Her oral temperature is 98.1 F (36.7 C). Her blood pressure is 117/73 and her pulse is 93. Her oxygen saturation is 99%. . Weight essentially stable. Lying down during the examination. Lungs are clear to auscultation bilaterally. Heart has regular rate and rhythm. Left breast shows some erythema, thick erythematous area in the left axilla with purulent drainage from this area, no odor. Impression . . . . . . . The patient is tolerating  radiation. Thickened area in the left axilla concerning for possible abscess or seroma Plan . . . . . . . . . . . . Continue treatment as planned. We will recheck her blood sugar before she leaves today. We cultured her left axilla. ________________________________   Blair Promise, PhD, MD  This document serves as a record of services personally performed by Gery Pray, MD. It was created on his behalf by Darcus Austin, a trained medical scribe. The creation of this record is based on the scribe's personal observations and the provider's statements to them. This document has been checked and approved by the attending provider.

## 2016-10-08 NOTE — Progress Notes (Signed)
Blood sugar was 190 when patient left the clinic.

## 2016-10-09 ENCOUNTER — Ambulatory Visit
Admission: RE | Admit: 2016-10-09 | Discharge: 2016-10-09 | Disposition: A | Discharge status: Home / Self Care | Payer: Medicare Other | Source: Ambulatory Visit | Attending: Radiation Oncology | Admitting: Radiation Oncology

## 2016-10-09 DIAGNOSIS — R42 Dizziness and giddiness: Secondary | ICD-10-CM | POA: Diagnosis not present

## 2016-10-09 DIAGNOSIS — C50312 Malignant neoplasm of lower-inner quadrant of left female breast: Secondary | ICD-10-CM | POA: Diagnosis not present

## 2016-10-09 DIAGNOSIS — R51 Headache: Secondary | ICD-10-CM | POA: Diagnosis not present

## 2016-10-09 DIAGNOSIS — Z510 Encounter for antineoplastic radiation therapy: Secondary | ICD-10-CM | POA: Diagnosis not present

## 2016-10-09 DIAGNOSIS — Z171 Estrogen receptor negative status [ER-]: Secondary | ICD-10-CM | POA: Diagnosis not present

## 2016-10-09 LAB — GLUCOSE, CAPILLARY
GLUCOSE-CAPILLARY: 190 mg/dL — AB (ref 65–99)
GLUCOSE-CAPILLARY: 65 mg/dL (ref 65–99)
Glucose-Capillary: 51 mg/dL — ABNORMAL LOW (ref 65–99)

## 2016-10-10 ENCOUNTER — Ambulatory Visit
Admission: RE | Admit: 2016-10-10 | Discharge: 2016-10-10 | Disposition: A | Discharge status: Home / Self Care | Payer: Medicare Other | Source: Ambulatory Visit | Attending: Radiation Oncology | Admitting: Radiation Oncology

## 2016-10-10 ENCOUNTER — Other Ambulatory Visit (HOSPITAL_COMMUNITY): Payer: Self-pay | Admitting: *Deleted

## 2016-10-10 DIAGNOSIS — Z510 Encounter for antineoplastic radiation therapy: Secondary | ICD-10-CM | POA: Diagnosis not present

## 2016-10-10 DIAGNOSIS — Z171 Estrogen receptor negative status [ER-]: Secondary | ICD-10-CM | POA: Diagnosis not present

## 2016-10-10 DIAGNOSIS — R51 Headache: Secondary | ICD-10-CM | POA: Diagnosis not present

## 2016-10-10 DIAGNOSIS — C50312 Malignant neoplasm of lower-inner quadrant of left female breast: Secondary | ICD-10-CM | POA: Diagnosis not present

## 2016-10-10 DIAGNOSIS — R42 Dizziness and giddiness: Secondary | ICD-10-CM | POA: Diagnosis not present

## 2016-10-10 MED ORDER — SACUBITRIL-VALSARTAN 24-26 MG PO TABS: 1.0000 | ORAL_TABLET | Freq: Two times a day (BID) | ORAL | 3 refills | Status: DC

## 2016-10-10 NOTE — Telephone Encounter (Signed)
Patient called and requested entresto refills to be sent to [facility].  Refills sent and patient aware.

## 2016-10-11 ENCOUNTER — Ambulatory Visit
Admission: RE | Admit: 2016-10-11 | Discharge: 2016-10-11 | Disposition: A | Discharge status: Home / Self Care | Payer: Medicare Other | Source: Ambulatory Visit | Attending: Radiation Oncology | Admitting: Radiation Oncology

## 2016-10-11 DIAGNOSIS — R51 Headache: Secondary | ICD-10-CM | POA: Diagnosis not present

## 2016-10-11 DIAGNOSIS — R42 Dizziness and giddiness: Secondary | ICD-10-CM | POA: Diagnosis not present

## 2016-10-11 DIAGNOSIS — C50312 Malignant neoplasm of lower-inner quadrant of left female breast: Secondary | ICD-10-CM | POA: Diagnosis not present

## 2016-10-11 DIAGNOSIS — Z171 Estrogen receptor negative status [ER-]: Secondary | ICD-10-CM | POA: Diagnosis not present

## 2016-10-11 DIAGNOSIS — Z510 Encounter for antineoplastic radiation therapy: Secondary | ICD-10-CM | POA: Diagnosis not present

## 2016-10-11 LAB — WOUND CULTURE

## 2016-10-14 ENCOUNTER — Ambulatory Visit: Payer: Medicare Other

## 2016-10-15 ENCOUNTER — Ambulatory Visit
Admission: RE | Admit: 2016-10-15 | Discharge: 2016-10-15 | Disposition: A | Discharge status: Home / Self Care | Payer: Medicare Other | Source: Ambulatory Visit | Attending: Radiation Oncology | Admitting: Radiation Oncology

## 2016-10-15 VITALS — BP 111/62 | HR 99 | Temp 97.7°F | Resp 20 | Wt 219.8 lb

## 2016-10-15 DIAGNOSIS — R51 Headache: Secondary | ICD-10-CM | POA: Diagnosis not present

## 2016-10-15 DIAGNOSIS — M545 Low back pain: Secondary | ICD-10-CM | POA: Diagnosis not present

## 2016-10-15 DIAGNOSIS — C50312 Malignant neoplasm of lower-inner quadrant of left female breast: Secondary | ICD-10-CM | POA: Diagnosis not present

## 2016-10-15 DIAGNOSIS — Z171 Estrogen receptor negative status [ER-]: Secondary | ICD-10-CM | POA: Diagnosis not present

## 2016-10-15 DIAGNOSIS — Z510 Encounter for antineoplastic radiation therapy: Secondary | ICD-10-CM | POA: Diagnosis not present

## 2016-10-15 DIAGNOSIS — I10 Essential (primary) hypertension: Secondary | ICD-10-CM | POA: Diagnosis not present

## 2016-10-15 DIAGNOSIS — C50511 Malignant neoplasm of lower-outer quadrant of right female breast: Secondary | ICD-10-CM

## 2016-10-15 DIAGNOSIS — L03112 Cellulitis of left axilla: Secondary | ICD-10-CM

## 2016-10-15 DIAGNOSIS — R42 Dizziness and giddiness: Secondary | ICD-10-CM | POA: Diagnosis not present

## 2016-10-15 NOTE — Progress Notes (Signed)
  Radiation Oncology         (336) [phone] ________________________________  Name: KERSTEN [name] MRN: [id number]  Date: 10/15/2016  DOB: 03-16-1956  Weekly Radiation Therapy Management    ICD-9-CM ICD-10-CM   1. Cellulitis of axilla, left 682.3 L03.112 Ambulatory referral to Infectious Disease  2. Malignant neoplasm of lower-outer quadrant of right breast of female, estrogen receptor negative (HCC) 174.5 C50.511    V86.1 Z17.1   3. Malignant neoplasm of lower-inner quadrant of left breast in female, estrogen receptor negative (HCC) 174.3 C50.312    V86.1 Z17.1       Current Dose: 25.2 Gy     Planned Dose:  62.4 Gy  Narrative . . . . . . . . The patient presents for routine under treatment assessment.                                    [name] has completed 14 fractions to her left breast. She reports significant fatigue. The patient reports pain in the left breast, 2/10 in severity, as well as pain and irritation to the left axilla. She denies nausea. Denies decrease in appetite, though she has some taste changes. The patient reports using Radiaplex as directed.                                  Set-up films were reviewed.                                 The chart was checked. Physical Findings. . .  weight is 219 lb 12.8 oz (99.7 kg). Her oral temperature is 97.7 F (36.5 C). Her blood pressure is 111/62 and her pulse is 99. Her respiration is 20 and oxygen saturation is 97%.  Weight essentially stable. Lungs are clear to auscultation bilaterally. Heart has regular rate and rhythm. Left breast shows some erythema, and a thick erythematous area in the left axilla with minimal  drainage from this area, no odor. There is an erythematous nodule along the left lower back region concerning for cellulitis, though it does not drain so I could not culture it at this time. Impression . . . . . . . The patient is tolerating radiation. Thickened area in the left axilla was cultured and came  back as Morganella morganii, and unfortunately no oral antibiotics will cover this. The patient will require a picc line or IM shot, and in light of this unusual result I will request an infection disease consult. Plan . . . . . . . . . . . . Continue treatment as planned. Referral to infectious diseases.  ________________________________   Blair Promise, PhD, MD  This document serves as a record of services personally performed by Gery Pray, MD. It was created on his behalf by Maryla Morrow, a trained medical scribe. The creation of this record is based on the scribe's personal observations and the provider's statements to them. This document has been checked and approved by the attending provider.

## 2016-10-15 NOTE — Progress Notes (Signed)
Lashara Urey completed 14th fraction to left breast.  Reports having a great deal of fatigue.  She states she has pain in left breast that rates about a 2 out of 10.  Patient denies any nausea, or decrease in appetite.  Does state she has taste changes.    Patient is using radiaplex and alra deodorant.  Her left axilla is inflamed and red.  She has some redness and hyperpigmentation to left breast.   Vitals:   10/15/16 1359  BP: 111/62  Pulse: 99  Resp: 20  Temp: 97.7 F (36.5 C)  TempSrc: Oral  SpO2: 97%  Weight: 219 lb 12.8 oz (99.7 kg)    Wt Readings from Last 3 Encounters:  10/15/16 219 lb 12.8 oz (99.7 kg)  10/08/16 218 lb (98.9 kg)  10/01/16 218 lb 6.4 oz (99.1 kg)

## 2016-10-16 ENCOUNTER — Telehealth: Payer: Self-pay | Admitting: *Deleted

## 2016-10-16 ENCOUNTER — Ambulatory Visit
Admission: RE | Admit: 2016-10-16 | Discharge: 2016-10-16 | Disposition: A | Discharge status: Home / Self Care | Payer: Medicare Other | Source: Ambulatory Visit | Attending: Radiation Oncology | Admitting: Radiation Oncology

## 2016-10-16 DIAGNOSIS — M4316 Spondylolisthesis, lumbar region: Secondary | ICD-10-CM | POA: Diagnosis not present

## 2016-10-16 DIAGNOSIS — Z171 Estrogen receptor negative status [ER-]: Secondary | ICD-10-CM | POA: Diagnosis not present

## 2016-10-16 DIAGNOSIS — I700 Atherosclerosis of aorta: Secondary | ICD-10-CM | POA: Diagnosis not present

## 2016-10-16 DIAGNOSIS — G8929 Other chronic pain: Secondary | ICD-10-CM | POA: Diagnosis not present

## 2016-10-16 DIAGNOSIS — Z510 Encounter for antineoplastic radiation therapy: Secondary | ICD-10-CM | POA: Diagnosis not present

## 2016-10-16 DIAGNOSIS — M545 Low back pain: Secondary | ICD-10-CM | POA: Diagnosis not present

## 2016-10-16 DIAGNOSIS — R51 Headache: Secondary | ICD-10-CM | POA: Diagnosis not present

## 2016-10-16 DIAGNOSIS — C50312 Malignant neoplasm of lower-inner quadrant of left female breast: Secondary | ICD-10-CM | POA: Diagnosis not present

## 2016-10-16 DIAGNOSIS — R42 Dizziness and giddiness: Secondary | ICD-10-CM | POA: Diagnosis not present

## 2016-10-16 NOTE — Telephone Encounter (Signed)
Called patient to inform of appt. with Dr. Tommy Medal on 10-17-16 @ 2 pm, address - [address], ph. No. [phone], spoke with patient and she is aware of this appt.

## 2016-10-17 ENCOUNTER — Ambulatory Visit
Admission: RE | Admit: 2016-10-17 | Discharge: 2016-10-17 | Disposition: A | Discharge status: Home / Self Care | Payer: Medicare Other | Source: Ambulatory Visit | Attending: Radiation Oncology | Admitting: Radiation Oncology

## 2016-10-17 ENCOUNTER — Encounter: Payer: Self-pay | Admitting: Infectious Disease

## 2016-10-17 ENCOUNTER — Ambulatory Visit (INDEPENDENT_AMBULATORY_CARE_PROVIDER_SITE_OTHER): Payer: Medicare Other | Admitting: Infectious Disease

## 2016-10-17 VITALS — BP 104/70 | HR 98 | Temp 97.8°F | Ht 65.0 in | Wt 222.0 lb

## 2016-10-17 DIAGNOSIS — C50511 Malignant neoplasm of lower-outer quadrant of right female breast: Secondary | ICD-10-CM

## 2016-10-17 DIAGNOSIS — E1040 Type 1 diabetes mellitus with diabetic neuropathy, unspecified: Secondary | ICD-10-CM | POA: Diagnosis not present

## 2016-10-17 DIAGNOSIS — C50312 Malignant neoplasm of lower-inner quadrant of left female breast: Secondary | ICD-10-CM

## 2016-10-17 DIAGNOSIS — L0292 Furuncle, unspecified: Secondary | ICD-10-CM

## 2016-10-17 DIAGNOSIS — R2232 Localized swelling, mass and lump, left upper limb: Secondary | ICD-10-CM

## 2016-10-17 DIAGNOSIS — Z510 Encounter for antineoplastic radiation therapy: Secondary | ICD-10-CM | POA: Diagnosis not present

## 2016-10-17 DIAGNOSIS — Z171 Estrogen receptor negative status [ER-]: Secondary | ICD-10-CM | POA: Diagnosis not present

## 2016-10-17 DIAGNOSIS — R42 Dizziness and giddiness: Secondary | ICD-10-CM | POA: Diagnosis not present

## 2016-10-17 DIAGNOSIS — R51 Headache: Secondary | ICD-10-CM | POA: Diagnosis not present

## 2016-10-17 HISTORY — DX: Furuncle, unspecified: L02.92

## 2016-10-17 HISTORY — DX: Localized swelling, mass and lump, left upper limb: R22.32

## 2016-10-17 MED ORDER — DOXYCYCLINE HYCLATE 100 MG PO TABS: 100.0000 mg | ORAL_TABLET | Freq: Two times a day (BID) | ORAL | 1 refills | Status: DC

## 2016-10-17 NOTE — Progress Notes (Signed)
Reason for consult:  Draining furuncle in left axilla    Requesting physician: Dr. Sondra Come    Subjective:      Patient ID: [name], female    DOB: Feb 21, 1956, 61 y.o.   MRN: [id number]  HPI  60 year old with   Complicated past medical history. 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 Note her chemo caused CHF.  She had recurrence of her disese in 2008 and underwen mastectomy. 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 She is currently undergoing XRT and began to develop a draining boil from her axilla. IT has been present for several months. She has not recevied any antibiotics for it nor had I and D. Dr Sondra Come cultured her axillary area and this yielded Morganella which was R to oral antibiotics and pt was referred to [place] in ID.  I suspect that the Morganella is a skin contaminant. This is one of the issues with obtaining surface and wound cultures the culpability of organisms isolated is always dubious.  Instead I would like to treat her with doxycycline in case this is a staphylococcal infection. I will start her on doxycycline 100 mg twice daily. I would also like to obtain ultrasound of Rex last she has some areas underneath the skin that are swollen likely representing inflamed lymph nodes but I want to also make sure there is no significant abscess.  Past Medical History:  Diagnosis Date  . AICD (automatic cardioverter/defibrillator) present    st jude  . Anxiety   . 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 Asthma   . Axillary mass, left 10/17/2016  . CHF (congestive heart failure) ([place])   . COPD (chronic obstructive pulmonary disease) ([place])   . Depression    followed by a psychiatrist  . DYSLIPIDEMIA 05/01/2009  . Dysrhythmia    atrial fibrillation  . Family history of breast cancer   . Family history of colon cancer   . Family  history of uterine cancer   . Furuncle 10/17/2016  . Gastroparesis   . GERD (gastroesophageal reflux disease)   . Headache   . History of breast cancer 2006   with return in 2008, s/p mastectomy, XRT and chemotherapy  . Hypertension   . 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 IBS (irritable bowel syndrome)   . ICD (implantable cardiac defibrillator) in place 11/10/2012   ICD (11/2012)  . Malignant neoplasm of lower-inner quadrant of left female breast ([place]) 06/26/2016  . Myocardial infarction   . OBESITY 07/24/2007  . OBSTRUCTIVE SLEEP APNEA 07/24/2007   with CPAP compliance  . Osteoarthritis   . Persistent atrial fibrillation ([place]) 09/2014  . Presence of permanent cardiac pacemaker   . RESTLESS LEG SYNDROME 07/24/2007  . SYSTOLIC HEART FAILURE, CHRONIC 01/05/2009   a. chemo induced cardiomyopathy b. cMRI (02/2009) EF 45% c. Myoview 05/2010: EF 59%, no ischemia d. RHC (01/2012) RA 20, RV 55/13/22, PA 56/34 (44), PCWP 28, Fick CO/CI: 3.1 / 1.5, PVR 5.3 WU, PA sat 42% & 48% e. ECHO (10/2012) EF 20-25%, diff HK, MV calcified annulus f. ECHO (03/2014) EF 30-35%, sev HK, inferior/inferoseptal walls, mild MR    Past Surgical History:  Procedure Laterality Date  . ABDOMINAL HYSTERECTOMY  1991  . BI-VENTRICULAR IMPLANTABLE CARDIOVERTER DEFIBRILLATOR N/A 11/10/2012   SJM Forify Assura single chamber ICD  . BREAST LUMPECTOMY WITH RADIOACTIVE SEED AND SENTINEL  LYMPH NODE BIOPSY Left 07/24/2016   Procedure: LEFT BREAST LUMPECTOMY WITH RADIOACTIVE SEED AND SENTINEL LYMPH NODE BIOPSY WITH BLUE DYE INJECTION;  Surgeon: Fanny Skates, MD;  Location: [facility];  Service: General;  Laterality: Left;  . CARDIAC CATHETERIZATION N/A 08/21/2015   Procedure: Right/Left Heart Cath and Coronary Angiography;  Surgeon: Jolaine Artist, MD;  Location: [facility];  Service: Cardiovascular;  Laterality: N/A;  . CARDIAC DEFIBRILLATOR PLACEMENT  11/10/2012   SJM Fortify Assura VR implanted by Dr Rayann Heman for primary  prevention  . CHOLECYSTECTOMY    . COLONOSCOPY N/A 11/17/2015   Procedure: COLONOSCOPY;  Surgeon: Jerene Bears, MD;  Location: [facility];  Service: Endoscopy;  Laterality: N/A;  . ENTEROSCOPY N/A 03/26/2016   Procedure: ENTEROSCOPY;  Surgeon: Manus Gunning, MD;  Location: [facility];  Service: Gastroenterology;  Laterality: N/A;  . ESOPHAGOGASTRODUODENOSCOPY N/A 11/16/2015   Procedure: ESOPHAGOGASTRODUODENOSCOPY (EGD);  Surgeon: Manus Gunning, MD;  Location: [facility];  Service: Gastroenterology;  Laterality: N/A;  . ESOPHAGOGASTRODUODENOSCOPY N/A 02/24/2016   Procedure: ESOPHAGOGASTRODUODENOSCOPY (EGD);  Surgeon: Milus Banister, MD;  Location: [facility];  Service: Endoscopy;  Laterality: N/A;  . ESOPHAGOGASTRODUODENOSCOPY N/A 03/26/2016   Procedure: ESOPHAGOGASTRODUODENOSCOPY (EGD);  Surgeon: Manus Gunning, MD;  Location: [facility];  Service: Gastroenterology;  Laterality: N/A;  . EYE SURGERY     cataracts  . GIVENS CAPSULE STUDY N/A 12/11/2015   Procedure: GIVENS CAPSULE STUDY;  Surgeon: Manus Gunning, MD;  Location: [facility];  Service: Gastroenterology;  Laterality: N/A;  . KNEE CARTILAGE SURGERY    . MASTECTOMY     right  . WRIST SURGERY Bilateral     Family History  Problem Relation Age of Onset  . Diabetes Mellitus II Mother   . Lung cancer Mother   . Congestive Heart Failure Mother   . Coronary artery disease Father     CABG x 3  . Hypertension Father   . Diabetes Mellitus II Father   . Breast cancer Cousin 29    maternal first cousin  . Colon cancer Cousin 93    maternal first cousin  . Uterine cancer Cousin     dx in her 9s  . Colon cancer Maternal Aunt     dx in her late 68s  . Uterine cancer Maternal Aunt 66  . Lung cancer Maternal Aunt   . Uterine cancer Maternal Aunt 38  . Colon cancer Maternal Aunt 64  . Breast cancer Maternal Grandmother 67  . Uterine cancer Maternal Aunt 48  . Uterine cancer Cousin     dx  in her 31s  . Uterine cancer Cousin 33      Social History   Social History  . Marital status: Divorced    Spouse name: N/A  . Number of children: 1  . Years of education: N/A   Occupational History  . Disabled Unemployed   Social History Main Topics  . Smoking status: Former Smoker    Quit date: 04/12/2001  . Smokeless tobacco: Never Used  . Alcohol use No  . Drug use: No  . Sexual activity: Not Currently   Other Topics Concern  . None   Social History Narrative   Lives with mother and brother Philis Nettle          Allergies  Allergen Reactions  . Bee Venom Anaphylaxis  . Aspirin Hives  . Ciprofloxacin Hives  . Codeine Nausea Only    "can take ONLY if she eats with med"  .  Naproxen Hives  . Other Other (See Comments)    ANY TYPES OF METAL-CAUSES BLISTERS SKIN AND ITCHING   . Sulfonamide Derivatives Hives  . Xarelto [Rivaroxaban] Hives  . Atorvastatin Itching  . Nsaids Itching and Rash  . Tape Rash    Also allergic to metal  . Tyler Aas Flextouch [Insulin Degludec] Itching and Rash     Current Outpatient Prescriptions:  .  acetaminophen (TYLENOL) 650 MG CR tablet, Take 1,300 mg by mouth every 8 (eight) hours as needed for pain., Disp: , Rfl:  .  ALPRAZolam (XANAX) 0.5 MG tablet, Take 0.25 mg by mouth 4 (four) times daily as needed for anxiety. , Disp: , Rfl:  .  amiodarone (PACERONE) 200 MG tablet, Take 1 tablet (200 mg total) by mouth daily., Disp: 90 tablet, Rfl: 3 .  apixaban (ELIQUIS) 5 MG TABS tablet, Take 1 tablet (5 mg total) by mouth 2 (two) times daily., Disp: 180 tablet, Rfl: 3 .  baclofen (LIORESAL) 20 MG tablet, Take 20 mg by mouth 2 (two) times daily as needed for muscle spasms., Disp: , Rfl:  .  busPIRone (BUSPAR) 30 MG tablet, Take 30 mg by mouth 2 (two) times daily. , Disp: , Rfl:  .  carvedilol (COREG) 6.25 MG tablet, Take 1 tablet (6.25 mg total) by mouth 2 (two) times daily with a meal., Disp: 180 tablet, Rfl: 3 .  digoxin (LANOXIN) 0.125  MG tablet, Take 1 tablet (0.125 mg total) by mouth every other day., Disp: 45 tablet, Rfl: 3 .  docusate sodium (COLACE) 100 MG capsule, Take 3 capsules (300 mg total) by mouth at bedtime., Disp: 10 capsule, Rfl: 0 .  DULoxetine (CYMBALTA) 30 MG capsule, Take 30 mg by mouth daily., Disp: , Rfl:  .  Ferrous Fumarate 63 (20 Fe) MG TABS, Take 1 tablet by mouth 2 (two) times daily., Disp: , Rfl:  .  furosemide (LASIX) 20 MG tablet, TAKE 3 TABLETS BY MOUTH 2  TIMES DAILY. IF 1 TO 2  POUND WEIGHT GAIN IN A DAY, TAKE EXTRA 20 MG DOSE, Disp: 540 tablet, Rfl: 3 .  Garlic 016 MG CAPS, Take 500 mg by mouth daily after lunch. , Disp: , Rfl:  .  HUMALOG KWIKPEN 100 UNIT/ML KiwkPen, Inject 14-24 Units into the skin 3 (three) times daily. On average per patient 24 unit with breakfast, 16 units with lunch, & 14-16 units with supper, Disp: , Rfl:  .  HUMALOG MIX 75/25 KWIKPEN (75-25) 100 UNIT/ML Kwikpen, Inject 75 Units into the skin 2 (two) times daily., Disp: , Rfl:  .  hyaluronate sodium (RADIAPLEXRX) GEL, Apply 1 application topically 2 (two) times daily., Disp: , Rfl:  .  hydrOXYzine (ATARAX/VISTARIL) 50 MG tablet, Take 50 mg by mouth every 6 (six) hours as needed for itching ((primarily taken at bedtime)). , Disp: , Rfl:  .  IRON PO, Take 24 mg by mouth daily., Disp: , Rfl:  .  ivabradine (CORLANOR) 5 MG TABS tablet, Take 1 tablet (5 mg total) by mouth 2 (two) times daily with a meal., Disp: 180 tablet, Rfl: 3 .  levothyroxine (SYNTHROID, LEVOTHROID) 75 MCG tablet, Take 75 mcg by mouth daily before breakfast. , Disp: , Rfl:  .  meclizine (ANTIVERT) 12.5 MG tablet, Take 1 tablet (12.5 mg total) by mouth 3 (three) times daily as needed for dizziness., Disp: 20 tablet, Rfl: 0 .  Melatonin 1 MG TABS, Take 0.5 mg by mouth at bedtime., Disp: , Rfl:  .  metolazone (ZAROXOLYN)  2.5 MG tablet, Take 1 tablet (2.5 mg total) by mouth as needed (for weight gain >3 lbs)., Disp: 45 tablet, Rfl: 3 .  mometasone (NASONEX) 50  MCG/ACT nasal spray, Place 1 spray into both nostrils daily. allergies, Disp: , Rfl:  .  Multiple Minerals (CALCIUM-MAGNESIUM-ZINC) TABS, Take 1 tablet by mouth daily after lunch. , Disp: , Rfl:  .  non-metallic deodorant (ALRA) MISC, Apply 1 application topically daily as needed., Disp: , Rfl:  .  Omega-3 Fatty Acids (FISH OIL) 1000 MG CAPS, Take 2 capsules by mouth daily., Disp: , Rfl:  .  pantoprazole (PROTONIX) 40 MG tablet, Take 40 mg by mouth 2 (two) times daily.  , Disp: , Rfl:  .  polyethylene glycol powder (GLYCOLAX/MIRALAX) powder, Mix 17 grams in 8 oz of water twice daily, Disp: 850 g, Rfl: 3 .  potassium chloride SA (K-DUR,KLOR-CON) 20 MEQ tablet, Take 4 tabs in AM and 3 tabs in PM, Disp: 36 tablet, Rfl: 0 .  Propylene Glycol (SYSTANE BALANCE) 0.6 % SOLN, Place 1 drop into both eyes 3 (three) times daily., Disp: , Rfl:  .  pyridOXINE (VITAMIN B-6) 100 MG tablet, Take 100 mg by mouth daily after lunch. , Disp: , Rfl:  .  rOPINIRole (REQUIP) 1 MG tablet, Take 1 mg by mouth at bedtime and may repeat dose one time if needed. IF RESTLESS LEGS PERSIST PATIENT MAY REPEAT ADDITIONAL DOSE, Disp: , Rfl:  .  rosuvastatin (CRESTOR) 10 MG tablet, Take 10 mg by mouth at bedtime., Disp: , Rfl:  .  sacubitril-valsartan (ENTRESTO) 24-26 MG, Take 1 tablet by mouth 2 (two) times daily., Disp: 60 tablet, Rfl: 3 .  Simethicone Extra Strength 125 MG CAPS, Take 1 tablet by mouth daily as needed (for bloating). Reported on 02/23/2016, Disp: , Rfl:  .  spironolactone (ALDACTONE) 25 MG tablet, Take 1 tablet (25 mg total) by mouth daily., Disp: 90 tablet, Rfl: 3 .  SUMAtriptan (IMITREX) 100 MG tablet, Take 100 mg by mouth daily as needed for migraine. , Disp: , Rfl:  .  tiotropium (SPIRIVA) 18 MCG inhalation capsule, Place 18 mcg into inhaler and inhale daily.  , Disp: , Rfl:  .  vitamin B-12 (CYANOCOBALAMIN) 1000 MCG tablet, Take 1,000 mcg by mouth daily after lunch. , Disp: , Rfl:  .  vitamin C (ASCORBIC ACID)  500 MG tablet, Take 500 mg by mouth daily after lunch. , Disp: , Rfl:  .  vitamin E 400 UNIT capsule, Take 400 Units by mouth daily after lunch. , Disp: , Rfl:  .  albuterol (PROVENTIL HFA) 108 (90 BASE) MCG/ACT inhaler, Inhale 2 puffs into the lungs every 6 (six) hours as needed. For shortness of breath, Disp: , Rfl:  .  doxycycline (VIBRA-TABS) 100 MG tablet, Take 1 tablet (100 mg total) by mouth 2 (two) times daily., Disp: 60 tablet, Rfl: 1 .  nitroGLYCERIN (NITROSTAT) 0.4 MG SL tablet, Place 1 tablet (0.4 mg total) under the tongue every 5 (five) minutes as needed. For chest pain (Patient not taking: Reported on 10/17/2016), Disp: 25 tablet, Rfl: 3   Review of Systems  Constitutional: Negative for chills and fever.  HENT: Negative for congestion and sore throat.   Eyes: Negative for photophobia.  Respiratory: Negative for cough, shortness of breath and wheezing.   Cardiovascular: Negative for chest pain, palpitations and leg swelling.  Gastrointestinal: Negative for abdominal pain, blood in stool, constipation, diarrhea, nausea and vomiting.  Genitourinary: Negative for dysuria, flank pain and hematuria.  Musculoskeletal: Negative for back pain and myalgias.  Skin: Positive for color change and wound. Negative for rash.  Neurological: Negative for dizziness, weakness and headaches.  Hematological: Does not bruise/bleed easily.  Psychiatric/Behavioral: Negative for suicidal ideas.       Objective:   Physical Exam  Constitutional: She is oriented to person, place, and time. She appears well-developed and well-nourished. No distress.  HENT:  Head: Normocephalic and atraumatic.  Mouth/Throat: No oropharyngeal exudate.  Eyes: Conjunctivae and EOM are normal. No scleral icterus.  Neck: Normal range of motion. Neck supple.  Cardiovascular: Normal rate and regular rhythm.   Pulmonary/Chest: Effort normal. No respiratory distress. She has no wheezes.  Abdominal: She exhibits no distension.   Musculoskeletal: She exhibits no edema or tenderness.  Neurological: She is alert and oriented to person, place, and time. She exhibits normal muscle tone. Coordination normal.  Skin: Skin is warm and dry. Rash noted. She is not diaphoretic. No erythema. No pallor.  Psychiatric: She has a normal mood and affect. Her behavior is normal. Judgment and thought content normal.    Left axilla:  10/17/16:       There are palpable masses here likely LN but I want to ensure no abscess     Assessment & Plan:   Boil in axilla: Most likely culprit would be MSSA vs MRSA --start Doxycycline 100 mg twice daily --Check ultrasound of the axilla to ensure no abscess   warm compress to the axilla 4-6 times a day or more  Morganella: likely a contaminant  I spent greater than 60 minutes with the patient including greater than 50% of time in face to face counsel of the patient re her axillary boil, her cancer history reviewing allergies, and treatment plan and in coordination of her care.

## 2016-10-18 ENCOUNTER — Ambulatory Visit
Admission: RE | Admit: 2016-10-18 | Discharge: 2016-10-18 | Disposition: A | Discharge status: Home / Self Care | Payer: Medicare Other | Source: Ambulatory Visit | Attending: Radiation Oncology | Admitting: Radiation Oncology

## 2016-10-18 DIAGNOSIS — Z510 Encounter for antineoplastic radiation therapy: Secondary | ICD-10-CM | POA: Diagnosis not present

## 2016-10-18 DIAGNOSIS — R42 Dizziness and giddiness: Secondary | ICD-10-CM | POA: Diagnosis not present

## 2016-10-18 DIAGNOSIS — Z171 Estrogen receptor negative status [ER-]: Secondary | ICD-10-CM | POA: Diagnosis not present

## 2016-10-18 DIAGNOSIS — C50312 Malignant neoplasm of lower-inner quadrant of left female breast: Secondary | ICD-10-CM | POA: Diagnosis not present

## 2016-10-18 DIAGNOSIS — R51 Headache: Secondary | ICD-10-CM | POA: Diagnosis not present

## 2016-10-21 ENCOUNTER — Telehealth: Payer: Self-pay | Admitting: Oncology

## 2016-10-21 ENCOUNTER — Telehealth (HOSPITAL_COMMUNITY): Payer: Self-pay | Admitting: *Deleted

## 2016-10-21 ENCOUNTER — Ambulatory Visit: Payer: Medicare Other

## 2016-10-21 NOTE — Telephone Encounter (Signed)
Patient left message on triage line saying she hasn't been feeling well.  She feels weak/tired/off balance.  I tried call her back but she was unavailable and left a message for her to call us back.

## 2016-10-21 NOTE — Telephone Encounter (Signed)
Called patient regarding missed radiation appointment today.  Requested a return call.

## 2016-10-22 ENCOUNTER — Ambulatory Visit
Admission: RE | Admit: 2016-10-22 | Discharge: 2016-10-22 | Disposition: A | Discharge status: Home / Self Care | Payer: Medicare Other | Source: Ambulatory Visit | Attending: Radiation Oncology | Admitting: Radiation Oncology

## 2016-10-22 VITALS — BP 107/58 | HR 94 | Temp 97.5°F | Resp 20 | Wt 213.0 lb

## 2016-10-22 DIAGNOSIS — Z171 Estrogen receptor negative status [ER-]: Principal | ICD-10-CM

## 2016-10-22 DIAGNOSIS — R42 Dizziness and giddiness: Secondary | ICD-10-CM | POA: Diagnosis not present

## 2016-10-22 DIAGNOSIS — C50312 Malignant neoplasm of lower-inner quadrant of left female breast: Secondary | ICD-10-CM | POA: Diagnosis not present

## 2016-10-22 DIAGNOSIS — R51 Headache: Secondary | ICD-10-CM | POA: Diagnosis not present

## 2016-10-22 DIAGNOSIS — Z510 Encounter for antineoplastic radiation therapy: Secondary | ICD-10-CM | POA: Diagnosis not present

## 2016-10-22 NOTE — Progress Notes (Signed)
Radiation Oncology         [phone] ________________________________  Name: [name] MRN: [id number]  Date: 10/22/2016  DOB: 08/26/1955   Weekly Radiation Therapy Management    ICD-9-CM ICD-10-CM   1. Malignant neoplasm of lower-inner quadrant of left breast in female, estrogen receptor negative (HCC) 174.3 C50.312    V86.1 Z17.1       Current Dose: 32.4 Gy     Planned Dose:  62.4 Gy  Narrative . . . . . . . . The patient presents for routine under treatment assessment.                                  [name] here today for 18th fraction to left breast.  She complains of pain in her back that she is using Tramadol and muscle relaxers for.  She also states that her right leg is extremely sensitive to touch along the shin and inner aspect of the leg.  No pain on the back or calf area with no redness.  She also complains of extreme dizziness that has her feeling very unstable when walking that started on Sunday.  She is fearful that she might fall.  She is concerned that it might be related to her heart medications.  She has a call back pending from her cardiologist. She says her appetite has decreased.    Patient states she saw Infectious Disease Doctor and he is going to treat it like MRSA in a po form that she has not started yet but is pending it in the mail.  Patient states she is using radiaplex twice daily.  Skin to left armpit is red and inflammed.  Skin to left breast is intact and red with hyperpigmentation  Patient will go home and hook up to her heart monitor to transmit her pacemaker/defibrillator info to her cardiologist in light of her recent dizziness. She may require med adjustment. She is currently not driving in light of her unsteadiness                                  Set-up films were reviewed.                                 The chart was checked. Physical Findings. . .  weight is 213 lb (96.6 kg). Her oral temperature is 97.5 F (36.4 C).  Her blood pressure is 107/58 (abnormal) and her pulse is 94. Her respiration is 20 and oxygen saturation is 98%.  Weight essentially stable. Lungs are clear to auscultation bilaterally. Heart has regular rate and rhythm. Erythema to the left breast without skin breakdown. Continued erythema and swelling in the left axillary region. No obvious drainage from the left axilla. Impression . . . . . . . The patient is tolerating radiation.  Plan . . . . . . . . . . . . Continue treatment as planned. The patient will follow up with her cardiologist. ________________________________   Blair Promise, PhD, MD  This document serves as a record of services personally performed by Gery Pray, MD. It was created on his behalf by Maryla Morrow, a trained medical scribe. The creation of this record is based on the scribe's personal observations and the provider's statements to them.  This document has been checked and approved by the attending provider.

## 2016-10-22 NOTE — Progress Notes (Signed)
[name] here today for 18th fraction to left breast.  She complains of pain in her back that she is using Tramadol and muscle relaxers for.  She also states that her right leg is extremely sensitive to touch along the shin and inner aspect of the leg.  No pain on the back or calf area with no redness.  She also complains of extreme dizziness that has her feeling very unstable when walking that started on Sunday.  She is fearful that she might fall.  She is concerned that it might be related to her heart medications.  She has a call back pending from them. She says her appetite has decreased.    Patient states she saw Infectious Disease Doctor and he is going to treat it like MRSA in a po form that she has not started yet but is pending it in the mail.  Patient states she is using radiaplex twice daily.  Skin to left armpit is red and inflammed.  Skin to left breast is intact and red with hyperpigmentation  Vitals:   10/22/16 1251  BP: (!) 107/58  Pulse: 94  Resp: 20  Temp: 97.5 F (36.4 C)  TempSrc: Oral  SpO2: 98%  Weight: 213 lb (96.6 kg)    Wt Readings from Last 3 Encounters:  10/22/16 213 lb (96.6 kg)  10/17/16 222 lb (100.7 kg)  10/15/16 219 lb 12.8 oz (99.7 kg)

## 2016-10-23 ENCOUNTER — Ambulatory Visit
Admission: RE | Admit: 2016-10-23 | Discharge: 2016-10-23 | Disposition: A | Discharge status: Home / Self Care | Payer: Medicare Other | Source: Ambulatory Visit | Attending: Radiation Oncology | Admitting: Radiation Oncology

## 2016-10-23 DIAGNOSIS — C50312 Malignant neoplasm of lower-inner quadrant of left female breast: Secondary | ICD-10-CM | POA: Diagnosis not present

## 2016-10-23 DIAGNOSIS — Z171 Estrogen receptor negative status [ER-]: Secondary | ICD-10-CM | POA: Diagnosis not present

## 2016-10-23 DIAGNOSIS — Z510 Encounter for antineoplastic radiation therapy: Secondary | ICD-10-CM | POA: Diagnosis not present

## 2016-10-23 DIAGNOSIS — R42 Dizziness and giddiness: Secondary | ICD-10-CM | POA: Diagnosis not present

## 2016-10-23 DIAGNOSIS — R51 Headache: Secondary | ICD-10-CM | POA: Diagnosis not present

## 2016-10-24 ENCOUNTER — Ambulatory Visit
Admission: RE | Admit: 2016-10-24 | Discharge: 2016-10-24 | Disposition: A | Discharge status: Home / Self Care | Payer: Medicare Other | Source: Ambulatory Visit | Attending: Radiation Oncology | Admitting: Radiation Oncology

## 2016-10-24 DIAGNOSIS — Z510 Encounter for antineoplastic radiation therapy: Secondary | ICD-10-CM | POA: Diagnosis not present

## 2016-10-24 DIAGNOSIS — R51 Headache: Secondary | ICD-10-CM | POA: Diagnosis not present

## 2016-10-24 DIAGNOSIS — Z171 Estrogen receptor negative status [ER-]: Secondary | ICD-10-CM | POA: Diagnosis not present

## 2016-10-24 DIAGNOSIS — R42 Dizziness and giddiness: Secondary | ICD-10-CM | POA: Diagnosis not present

## 2016-10-24 DIAGNOSIS — C50312 Malignant neoplasm of lower-inner quadrant of left female breast: Secondary | ICD-10-CM | POA: Diagnosis not present

## 2016-10-25 ENCOUNTER — Ambulatory Visit
Admission: RE | Admit: 2016-10-25 | Discharge: 2016-10-25 | Disposition: A | Discharge status: Home / Self Care | Payer: Medicare Other | Source: Ambulatory Visit | Attending: Radiation Oncology | Admitting: Radiation Oncology

## 2016-10-25 DIAGNOSIS — C50312 Malignant neoplasm of lower-inner quadrant of left female breast: Secondary | ICD-10-CM | POA: Diagnosis not present

## 2016-10-25 DIAGNOSIS — R42 Dizziness and giddiness: Secondary | ICD-10-CM | POA: Diagnosis not present

## 2016-10-25 DIAGNOSIS — Z510 Encounter for antineoplastic radiation therapy: Secondary | ICD-10-CM | POA: Diagnosis not present

## 2016-10-25 DIAGNOSIS — Z171 Estrogen receptor negative status [ER-]: Secondary | ICD-10-CM | POA: Diagnosis not present

## 2016-10-25 DIAGNOSIS — R51 Headache: Secondary | ICD-10-CM | POA: Diagnosis not present

## 2016-10-28 ENCOUNTER — Ambulatory Visit
Admission: RE | Admit: 2016-10-28 | Discharge: 2016-10-28 | Disposition: A | Discharge status: Home / Self Care | Payer: Medicare Other | Source: Ambulatory Visit | Attending: Radiation Oncology | Admitting: Radiation Oncology

## 2016-10-28 DIAGNOSIS — C50312 Malignant neoplasm of lower-inner quadrant of left female breast: Secondary | ICD-10-CM | POA: Diagnosis not present

## 2016-10-28 DIAGNOSIS — R42 Dizziness and giddiness: Secondary | ICD-10-CM | POA: Diagnosis not present

## 2016-10-28 DIAGNOSIS — Z171 Estrogen receptor negative status [ER-]: Secondary | ICD-10-CM | POA: Diagnosis not present

## 2016-10-28 DIAGNOSIS — R51 Headache: Secondary | ICD-10-CM | POA: Diagnosis not present

## 2016-10-28 DIAGNOSIS — Z510 Encounter for antineoplastic radiation therapy: Secondary | ICD-10-CM | POA: Diagnosis not present

## 2016-10-29 ENCOUNTER — Encounter: Payer: Self-pay | Admitting: Radiation Oncology

## 2016-10-29 ENCOUNTER — Ambulatory Visit
Admission: RE | Admit: 2016-10-29 | Discharge: 2016-10-29 | Disposition: A | Discharge status: Home / Self Care | Payer: Medicare Other | Source: Ambulatory Visit | Attending: Radiation Oncology | Admitting: Radiation Oncology

## 2016-10-29 VITALS — BP 109/72 | HR 91 | Temp 97.6°F | Ht 65.0 in | Wt 215.6 lb

## 2016-10-29 DIAGNOSIS — Z171 Estrogen receptor negative status [ER-]: Secondary | ICD-10-CM | POA: Insufficient documentation

## 2016-10-29 DIAGNOSIS — R51 Headache: Secondary | ICD-10-CM | POA: Diagnosis not present

## 2016-10-29 DIAGNOSIS — R42 Dizziness and giddiness: Secondary | ICD-10-CM | POA: Diagnosis not present

## 2016-10-29 DIAGNOSIS — C50312 Malignant neoplasm of lower-inner quadrant of left female breast: Secondary | ICD-10-CM | POA: Diagnosis not present

## 2016-10-29 DIAGNOSIS — Z510 Encounter for antineoplastic radiation therapy: Secondary | ICD-10-CM | POA: Diagnosis not present

## 2016-10-29 MED ORDER — RADIAPLEXRX EX GEL
Freq: Once | CUTANEOUS | Status: AC
  Administered 2016-10-29: 15:00:00 via TOPICAL

## 2016-10-29 NOTE — Progress Notes (Signed)
[name] has completed 23 fractions to her left breast.  She reports having some discomfort under her left breast.  She reports her left axilla is better and she is taking doxycycline and will take it for 30 days.  She reports feeling fatigue.  She is using radiaplex and has been provided with a refill.  The skin on her left breast is red with an area of desquamation under her breast.     BP 109/72 (BP Location: Right Arm, Patient Position: Sitting)   Pulse 91   Temp 97.6 F (36.4 C) (Oral)   Ht 5\' 5"  (1.651 m)   Wt 215 lb 9.6 oz (97.8 kg)   SpO2 98%   BMI 35.88 kg/m    Wt Readings from Last 3 Encounters:  10/29/16 215 lb 9.6 oz (97.8 kg)  10/22/16 213 lb (96.6 kg)  10/17/16 222 lb (100.7 kg)

## 2016-10-29 NOTE — Addendum Note (Signed)
Encounter addended by: Jacqulyn Liner, RN on: 10/29/2016  3:12 PM<BR>    Actions taken: Order list changed, Diagnosis association updated, MAR administration accepted

## 2016-10-29 NOTE — Progress Notes (Signed)
  Radiation Oncology         [phone] ________________________________  Name: [name] MRN: [id number]  Date: 10/29/2016  DOB: 06/02/1956   Weekly Radiation Therapy Management    ICD-9-CM ICD-10-CM   1. Malignant neoplasm of lower-inner quadrant of left breast in female, estrogen receptor negative (HCC) 174.3 C50.312    V86.1 Z17.1       Current Dose: 41.4 Gy     Planned Dose:  62.4 Gy  Narrative . . . . . . . . The patient presents for routine under treatment assessment.                                  Barbi has completed 23 fractions to her left breast.  She reports having some discomfort under her left breast. Her left axilla feels better and she is taking doxycycline and will take it for 30 days.  She reports feeling fatigued.  She is using radiaplex and has been provided with a refill.  The nurse notes skin on her left breast is red with an area of desquamation under her breast.                                  Set-up films were reviewed.                                 The chart was checked. Physical Findings. . .  height is 5\' 5"  (1.651 m) and weight is 215 lb 9.6 oz (97.8 kg). Her oral temperature is 97.6 F (36.4 C). Her blood pressure is 109/72 and her pulse is 91. Her oxygen saturation is 98%.  Weight essentially stable. Lungs are clear to auscultation bilaterally. Heart has regular rate and rhythm. Erythema and radiation dermatitis and impending moist desquamation of the left IM fold.  Impression . . . . . . . The patient is tolerating radiation.  Plan . . . . . . . . . . . . Continue treatment as planned. Kerlex dressings placed in the IM fold and she is to apply triple antbiotic ointment to the IM fold. ________________________________   Blair Promise, PhD, MD  This document serves as a record of services personally performed by Gery Pray, MD. It was created on his behalf by Darcus Austin, a trained medical scribe. The creation of this record is based on  the scribe's personal observations and the provider's statements to them. This document has been checked and approved by the attending provider.

## 2016-10-30 ENCOUNTER — Ambulatory Visit
Admission: RE | Admit: 2016-10-30 | Discharge: 2016-10-30 | Disposition: A | Discharge status: Home / Self Care | Payer: Medicare Other | Source: Ambulatory Visit | Attending: Radiation Oncology | Admitting: Radiation Oncology

## 2016-10-30 DIAGNOSIS — R42 Dizziness and giddiness: Secondary | ICD-10-CM | POA: Diagnosis not present

## 2016-10-30 DIAGNOSIS — R51 Headache: Secondary | ICD-10-CM | POA: Diagnosis not present

## 2016-10-30 DIAGNOSIS — Z171 Estrogen receptor negative status [ER-]: Secondary | ICD-10-CM | POA: Diagnosis not present

## 2016-10-30 DIAGNOSIS — Z510 Encounter for antineoplastic radiation therapy: Secondary | ICD-10-CM | POA: Diagnosis not present

## 2016-10-30 DIAGNOSIS — C50312 Malignant neoplasm of lower-inner quadrant of left female breast: Secondary | ICD-10-CM | POA: Diagnosis not present

## 2016-10-31 ENCOUNTER — Ambulatory Visit
Admission: RE | Admit: 2016-10-31 | Discharge: 2016-10-31 | Disposition: A | Discharge status: Home / Self Care | Payer: Medicare Other | Source: Ambulatory Visit | Attending: Radiation Oncology | Admitting: Radiation Oncology

## 2016-10-31 ENCOUNTER — Telehealth: Payer: Self-pay | Admitting: Cardiology

## 2016-10-31 DIAGNOSIS — R42 Dizziness and giddiness: Secondary | ICD-10-CM | POA: Diagnosis not present

## 2016-10-31 DIAGNOSIS — Z171 Estrogen receptor negative status [ER-]: Secondary | ICD-10-CM | POA: Diagnosis not present

## 2016-10-31 DIAGNOSIS — Z510 Encounter for antineoplastic radiation therapy: Secondary | ICD-10-CM | POA: Diagnosis not present

## 2016-10-31 DIAGNOSIS — R51 Headache: Secondary | ICD-10-CM | POA: Diagnosis not present

## 2016-10-31 DIAGNOSIS — C50312 Malignant neoplasm of lower-inner quadrant of left female breast: Secondary | ICD-10-CM | POA: Diagnosis not present

## 2016-10-31 NOTE — Telephone Encounter (Signed)
Spoke w/ pt and requested that she send a manual transmission b/c her home monitor has not updated in at least 7 days.   

## 2016-11-01 ENCOUNTER — Ambulatory Visit
Admission: RE | Admit: 2016-11-01 | Discharge: 2016-11-01 | Disposition: A | Discharge status: Home / Self Care | Payer: Medicare Other | Source: Ambulatory Visit | Attending: Radiation Oncology | Admitting: Radiation Oncology

## 2016-11-01 DIAGNOSIS — R51 Headache: Secondary | ICD-10-CM | POA: Diagnosis not present

## 2016-11-01 DIAGNOSIS — Z510 Encounter for antineoplastic radiation therapy: Secondary | ICD-10-CM | POA: Diagnosis not present

## 2016-11-01 DIAGNOSIS — C50312 Malignant neoplasm of lower-inner quadrant of left female breast: Secondary | ICD-10-CM | POA: Diagnosis not present

## 2016-11-01 DIAGNOSIS — R42 Dizziness and giddiness: Secondary | ICD-10-CM | POA: Diagnosis not present

## 2016-11-01 DIAGNOSIS — Z171 Estrogen receptor negative status [ER-]: Secondary | ICD-10-CM | POA: Diagnosis not present

## 2016-11-04 ENCOUNTER — Ambulatory Visit: Payer: Medicare Other

## 2016-11-04 ENCOUNTER — Ambulatory Visit
Admission: RE | Admit: 2016-11-04 | Discharge: 2016-11-04 | Disposition: A | Discharge status: Home / Self Care | Payer: Medicare Other | Source: Ambulatory Visit | Attending: Radiation Oncology | Admitting: Radiation Oncology

## 2016-11-04 DIAGNOSIS — R51 Headache: Secondary | ICD-10-CM | POA: Diagnosis not present

## 2016-11-04 DIAGNOSIS — Z510 Encounter for antineoplastic radiation therapy: Secondary | ICD-10-CM | POA: Diagnosis not present

## 2016-11-04 DIAGNOSIS — Z171 Estrogen receptor negative status [ER-]: Secondary | ICD-10-CM | POA: Diagnosis not present

## 2016-11-04 DIAGNOSIS — C50312 Malignant neoplasm of lower-inner quadrant of left female breast: Secondary | ICD-10-CM | POA: Diagnosis not present

## 2016-11-04 DIAGNOSIS — R42 Dizziness and giddiness: Secondary | ICD-10-CM | POA: Diagnosis not present

## 2016-11-05 ENCOUNTER — Ambulatory Visit
Admission: RE | Admit: 2016-11-05 | Discharge: 2016-11-05 | Disposition: A | Discharge status: Home / Self Care | Payer: Medicare Other | Source: Ambulatory Visit | Attending: Radiation Oncology | Admitting: Radiation Oncology

## 2016-11-05 ENCOUNTER — Ambulatory Visit: Payer: Medicare Other

## 2016-11-05 ENCOUNTER — Encounter: Payer: Self-pay | Admitting: Radiation Oncology

## 2016-11-05 VITALS — BP 109/75 | HR 88 | Temp 98.0°F | Ht 65.0 in | Wt 217.0 lb

## 2016-11-05 DIAGNOSIS — C50511 Malignant neoplasm of lower-outer quadrant of right female breast: Secondary | ICD-10-CM

## 2016-11-05 DIAGNOSIS — C50312 Malignant neoplasm of lower-inner quadrant of left female breast: Secondary | ICD-10-CM | POA: Diagnosis not present

## 2016-11-05 DIAGNOSIS — Z171 Estrogen receptor negative status [ER-]: Principal | ICD-10-CM

## 2016-11-05 DIAGNOSIS — R51 Headache: Secondary | ICD-10-CM | POA: Diagnosis not present

## 2016-11-05 DIAGNOSIS — R42 Dizziness and giddiness: Secondary | ICD-10-CM | POA: Diagnosis not present

## 2016-11-05 DIAGNOSIS — Z510 Encounter for antineoplastic radiation therapy: Secondary | ICD-10-CM | POA: Diagnosis not present

## 2016-11-05 NOTE — Addendum Note (Signed)
Addended by: Lorne Skeens D on: 11/05/2016 03:54 PM   Modules accepted: Orders

## 2016-11-05 NOTE — Progress Notes (Signed)
[name] has completed 28 fractions to her left breast. She denies having pain.  She reports feeling fatigued.  She continues to take doxycyline and will keep taking it for 2 more weeks.  She is using radiaplex as directed, hydrocortizone cream for itching and triple antibiotic under her breast.  The skin on her left breast is red.  She has desquamation under her breast.   BP 109/75 (BP Location: Right Arm, Patient Position: Sitting)   Pulse 88   Temp 98 F (36.7 C) (Oral)   Ht 5\' 5"  (1.651 m)   Wt 217 lb (98.4 kg)   SpO2 97%   BMI 36.11 kg/m    Wt Readings from Last 3 Encounters:  11/05/16 217 lb (98.4 kg)  10/29/16 215 lb 9.6 oz (97.8 kg)  10/22/16 213 lb (96.6 kg)

## 2016-11-05 NOTE — Progress Notes (Signed)
  Radiation Oncology         (336) [phone] ________________________________  Name: [name] MRN: [id number]  Date: 11/05/2016  DOB: 04-26-1956   Weekly Radiation Therapy Management    ICD-9-CM ICD-10-CM   1. Malignant neoplasm of lower-outer quadrant of right breast of female, estrogen receptor negative (HCC) 174.5 C50.511    V86.1 Z17.1       Current Dose: 50.4 Gy     Planned Dose:  62.4 Gy  Narrative . . . . . . . . The patient presents for routine under treatment assessment.                                  [name] has completed 28 fractions to her left breast. She denies having pain. She reports feeling fatigued.  She continues to take doxycyline for a boil in her left axilla that was positive for [name] and will keep taking it for 2 more weeks.  She is using radiaplex as directed, hydrocortizone cream for itching, and triple antibiotic under her breast.  The nurse notes the skin on her left breast is red and desquamation under her breast. She reports feeling "sick on my stomach" at times and she reports a poor appetite when that happens.                                  Set-up films were reviewed.                                 The chart was checked. Physical Findings. . .  height is 5\' 5"  (1.651 m) and weight is 217 lb (98.4 kg). Her oral temperature is 98 F (36.7 C). Her blood pressure is 109/75 and her pulse is 88. Her oxygen saturation is 97%.  Weight essentially stable. Lungs are clear to auscultation bilaterally. Heart has regular rate and rhythm. Erythema of the left breast and moist desquamation in the left IM fold. Less erythema in the left axillary region. Minimal drainage from the left axilla. Impression . . . . . . . The patient is tolerating radiation.  Plan . . . . . . . . . . . . Continue treatment as planned. Continue using neosporin in the left IM fold. ________________________________   Blair Promise, PhD, MD  This document serves as a  record of services personally performed by Gery Pray, MD. It was created on his behalf by Darcus Austin, a trained medical scribe. The creation of this record is based on the scribe's personal observations and the provider's statements to them. This document has been checked and approved by the attending provider.

## 2016-11-06 ENCOUNTER — Ambulatory Visit
Admission: RE | Admit: 2016-11-06 | Discharge: 2016-11-06 | Disposition: A | Discharge status: Home / Self Care | Payer: Medicare Other | Source: Ambulatory Visit | Attending: Radiation Oncology | Admitting: Radiation Oncology

## 2016-11-06 ENCOUNTER — Other Ambulatory Visit: Payer: Self-pay | Admitting: Infectious Disease

## 2016-11-06 ENCOUNTER — Ambulatory Visit: Payer: Medicare Other

## 2016-11-06 ENCOUNTER — Telehealth: Payer: Self-pay | Admitting: *Deleted

## 2016-11-06 ENCOUNTER — Ambulatory Visit
Admission: RE | Admit: 2016-11-06 | Discharge: 2016-11-06 | Disposition: A | Discharge status: Home / Self Care | Payer: Medicare Other | Source: Ambulatory Visit | Attending: Infectious Disease | Admitting: Infectious Disease

## 2016-11-06 DIAGNOSIS — L0292 Furuncle, unspecified: Secondary | ICD-10-CM

## 2016-11-06 DIAGNOSIS — Z510 Encounter for antineoplastic radiation therapy: Secondary | ICD-10-CM | POA: Insufficient documentation

## 2016-11-06 DIAGNOSIS — N6489 Other specified disorders of breast: Secondary | ICD-10-CM | POA: Diagnosis not present

## 2016-11-06 DIAGNOSIS — R2232 Localized swelling, mass and lump, left upper limb: Secondary | ICD-10-CM

## 2016-11-06 DIAGNOSIS — F419 Anxiety disorder, unspecified: Secondary | ICD-10-CM | POA: Insufficient documentation

## 2016-11-06 DIAGNOSIS — R42 Dizziness and giddiness: Secondary | ICD-10-CM | POA: Insufficient documentation

## 2016-11-06 DIAGNOSIS — Z171 Estrogen receptor negative status [ER-]: Secondary | ICD-10-CM | POA: Insufficient documentation

## 2016-11-06 DIAGNOSIS — L089 Local infection of the skin and subcutaneous tissue, unspecified: Secondary | ICD-10-CM | POA: Diagnosis not present

## 2016-11-06 DIAGNOSIS — R51 Headache: Secondary | ICD-10-CM | POA: Insufficient documentation

## 2016-11-06 DIAGNOSIS — C50312 Malignant neoplasm of lower-inner quadrant of left female breast: Secondary | ICD-10-CM | POA: Insufficient documentation

## 2016-11-06 HISTORY — DX: Malignant neoplasm of unspecified site of unspecified female breast: C50.919

## 2016-11-06 HISTORY — DX: Personal history of irradiation: Z92.3

## 2016-11-06 NOTE — Telephone Encounter (Signed)
RN spoke with [name].  Pt given telephone number for [facility] to call to schedule imaging studies as soon as possible.  [phone], option 3)  Patient stated that she would call.

## 2016-11-07 ENCOUNTER — Telehealth: Payer: Self-pay | Admitting: Cardiology

## 2016-11-07 ENCOUNTER — Ambulatory Visit: Payer: Medicare Other

## 2016-11-07 ENCOUNTER — Ambulatory Visit
Admission: RE | Admit: 2016-11-07 | Discharge: 2016-11-07 | Disposition: A | Discharge status: Home / Self Care | Payer: Medicare Other | Source: Ambulatory Visit | Attending: Radiation Oncology | Admitting: Radiation Oncology

## 2016-11-07 DIAGNOSIS — C50312 Malignant neoplasm of lower-inner quadrant of left female breast: Secondary | ICD-10-CM | POA: Diagnosis not present

## 2016-11-07 NOTE — Telephone Encounter (Signed)
LMOVM requesting that pt send manual transmission b/c home monitor has not updated in at least 7 days.    

## 2016-11-08 ENCOUNTER — Ambulatory Visit: Payer: Medicare Other

## 2016-11-08 ENCOUNTER — Ambulatory Visit
Admission: RE | Admit: 2016-11-08 | Discharge: 2016-11-08 | Disposition: A | Discharge status: Home / Self Care | Payer: Medicare Other | Source: Ambulatory Visit | Attending: Radiation Oncology | Admitting: Radiation Oncology

## 2016-11-08 DIAGNOSIS — C50312 Malignant neoplasm of lower-inner quadrant of left female breast: Secondary | ICD-10-CM | POA: Diagnosis not present

## 2016-11-11 ENCOUNTER — Ambulatory Visit
Admission: RE | Admit: 2016-11-11 | Discharge: 2016-11-11 | Disposition: A | Discharge status: Home / Self Care | Payer: Medicare Other | Source: Ambulatory Visit | Attending: Radiation Oncology | Admitting: Radiation Oncology

## 2016-11-11 ENCOUNTER — Ambulatory Visit: Payer: Medicare Other

## 2016-11-11 DIAGNOSIS — C50312 Malignant neoplasm of lower-inner quadrant of left female breast: Secondary | ICD-10-CM | POA: Diagnosis not present

## 2016-11-12 ENCOUNTER — Ambulatory Visit
Admission: RE | Admit: 2016-11-12 | Discharge: 2016-11-12 | Disposition: A | Discharge status: Home / Self Care | Payer: Medicare Other | Source: Ambulatory Visit | Attending: Radiation Oncology | Admitting: Radiation Oncology

## 2016-11-12 ENCOUNTER — Ambulatory Visit: Payer: Medicare Other

## 2016-11-12 ENCOUNTER — Ambulatory Visit: Payer: Medicare Other | Admitting: Radiation Oncology

## 2016-11-12 DIAGNOSIS — C50312 Malignant neoplasm of lower-inner quadrant of left female breast: Secondary | ICD-10-CM | POA: Diagnosis not present

## 2016-11-13 ENCOUNTER — Ambulatory Visit
Admission: RE | Admit: 2016-11-13 | Discharge: 2016-11-13 | Disposition: A | Discharge status: Home / Self Care | Payer: Medicare Other | Source: Ambulatory Visit | Attending: Radiation Oncology | Admitting: Radiation Oncology

## 2016-11-13 DIAGNOSIS — C50312 Malignant neoplasm of lower-inner quadrant of left female breast: Secondary | ICD-10-CM | POA: Diagnosis not present

## 2016-11-14 ENCOUNTER — Encounter: Payer: Self-pay | Admitting: Radiation Oncology

## 2016-11-14 NOTE — Progress Notes (Signed)
  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  Beams/energy:   Left Breast : 3D  //  6X        Boost : Isodose Plan  //  10X, 6X  Narrative: The patient tolerated radiation treatment relatively well. The patient denied pain throughout treatment. She reported some fatigue over the course of treatment. At the conclusion of treatment the patient continued to take doxycycline (ID recommendations) for a boil in her left axilla that was positive for Morganella morganii. During treatment the patient experienced radiation related skin changes including erythema of the left breast as well as moist desquamation in the left inframammary fold.  Plan: The patient has completed radiation treatment. The patient will continue to use Neosporin in the inframammary fold as needed. She will finish her course of doxycycline as prescribed. The patient will return to radiation oncology clinic for routine followup in one month. I advised them to call or return sooner if they have any questions or concerns related to their recovery or treatment.  -----------------------------------  Blair Promise, PhD, MD  This document serves as a record of services personally performed by Gery Pray, MD. It was created on his behalf by Maryla Morrow, a trained medical scribe. The creation of this record is based on the scribe's personal observations and the provider's statements to them. This document has been checked and approved by the attending provider.

## 2016-11-15 ENCOUNTER — Ambulatory Visit: Payer: Self-pay | Admitting: *Deleted

## 2016-11-18 ENCOUNTER — Other Ambulatory Visit: Payer: Self-pay

## 2016-11-20 ENCOUNTER — Ambulatory Visit: Payer: Medicare Other | Admitting: Infectious Disease

## 2016-11-25 ENCOUNTER — Institutional Professional Consult (permissible substitution): Payer: Medicare Other | Admitting: Internal Medicine

## 2016-11-27 ENCOUNTER — Ambulatory Visit (INDEPENDENT_AMBULATORY_CARE_PROVIDER_SITE_OTHER): Payer: Medicare Other | Admitting: Pulmonary Disease

## 2016-11-27 ENCOUNTER — Encounter: Payer: Self-pay | Admitting: Pulmonary Disease

## 2016-11-27 VITALS — BP 96/52 | HR 91 | Ht 65.0 in | Wt 214.0 lb

## 2016-11-27 DIAGNOSIS — G4733 Obstructive sleep apnea (adult) (pediatric): Secondary | ICD-10-CM | POA: Diagnosis not present

## 2016-11-27 NOTE — Patient Instructions (Signed)
Will arrange for home sleep study Will call to arrange for follow up after sleep study reviewed  

## 2016-11-27 NOTE — Progress Notes (Signed)
   Subjective:    Patient ID: TANE BIEGLER, female    DOB: 05-Nov-1955, 61 y.o.   MRN: [id number]  HPI    Review of Systems  Constitutional: Positive for unexpected weight change. Negative for fever.  HENT: Positive for congestion and sneezing. Negative for dental problem, ear pain, nosebleeds, postnasal drip, rhinorrhea, sinus pressure, sore throat and trouble swallowing.   Eyes: Negative for redness and itching.  Respiratory: Positive for shortness of breath. Negative for cough, chest tightness and wheezing.   Cardiovascular: Positive for chest pain and palpitations ( irregular). Negative for leg swelling.  Gastrointestinal: Positive for abdominal pain. Negative for nausea and vomiting.  Genitourinary: Negative for dysuria.  Musculoskeletal: Positive for arthralgias and joint swelling.  Skin: Negative for rash ( itching).  Neurological: Negative for headaches.  Hematological: Does not bruise/bleed easily.  Psychiatric/Behavioral: Positive for dysphoric mood. The patient is nervous/anxious.        Objective:   Physical Exam        Assessment & Plan:

## 2016-11-27 NOTE — Progress Notes (Signed)
Past Surgical History She  has a past surgical history that includes Mastectomy; Abdominal hysterectomy (1991); Cardiac defibrillator placement (11/10/2012); Cholecystectomy; Wrist surgery (Bilateral); Knee cartilage surgery; bi-ventricular implantable cardioverter defibrillator (N/A, 11/10/2012); Cardiac catheterization (N/A, 08/21/2015); Esophagogastroduodenoscopy (N/A, 11/16/2015); Colonoscopy (N/A, 11/17/2015); Givens capsule study (N/A, 12/11/2015); Esophagogastroduodenoscopy (N/A, 02/24/2016); enteroscopy (N/A, 03/26/2016); Esophagogastroduodenoscopy (N/A, 03/26/2016); Eye surgery; Breast lumpectomy with radioactive seed and sentinel lymph node biopsy (Left, 07/24/2016); Breast lumpectomy (Left, 07/24/2016); Breast biopsy (Left, 08/2015); and Breast biopsy (Left, 06/2016).  Allergies  Allergen Reactions  . Bee Venom Anaphylaxis  . Aspirin Hives  . Ciprofloxacin Hives  . Codeine Nausea Only    "can take ONLY if she eats with med"  . Naproxen Hives  . Other Other (See Comments)    ANY TYPES OF METAL-CAUSES BLISTERS SKIN AND ITCHING   . Sulfonamide Derivatives Hives  . Xarelto [Rivaroxaban] Hives  . Atorvastatin Itching  . Nsaids Itching and Rash  . Tape Rash    Also allergic to metal  . Tyler Aas Flextouch [Insulin Degludec] Itching and Rash    Family History Her family history includes Breast cancer (age of onset: 35) in her cousin; Breast cancer (age of onset: 30) in her maternal grandmother; Colon cancer in her maternal aunt; Colon cancer (age of onset: 2) in her maternal aunt; Colon cancer (age of onset: 88) in her cousin; Congestive Heart Failure in her mother; Coronary artery disease in her father; Diabetes Mellitus II in her father and mother; Hypertension in her father; Lung cancer in her maternal aunt and mother; Uterine cancer in her cousin and cousin; Uterine cancer (age of onset: 78) in her cousin; Uterine cancer (age of onset: 61) in her maternal aunt; Uterine cancer (age of onset: 10)  in her maternal aunt; Uterine cancer (age of onset: 20) in her maternal aunt.  Social History She  reports that she quit smoking about 15 years ago. Her smoking use included Cigarettes. She has a 49.50 pack-year smoking history. She has never used smokeless tobacco. She reports that she does not drink alcohol or use drugs.  Review of systems Dyspnea, chest pain, palpitations, decreased appetite, abdominal pain, nasal congestion, sneezing, itching, anxiety, depression, swelling, joint pain. Remainder of 12 point ROS negative.  Current Outpatient Prescriptions on File Prior to Visit  Medication Sig  . acetaminophen (TYLENOL) 650 MG CR tablet Take 1,300 mg by mouth every 8 (eight) hours as needed for pain.  Marland Kitchen albuterol (PROVENTIL HFA) 108 (90 BASE) MCG/ACT inhaler Inhale 2 puffs into the lungs every 6 (six) hours as needed. For shortness of breath  . ALPRAZolam (XANAX) 0.5 MG tablet Take 0.25 mg by mouth 4 (four) times daily as needed for anxiety.   Marland Kitchen amiodarone (PACERONE) 200 MG tablet Take 1 tablet (200 mg total) by mouth daily.  Marland Kitchen apixaban (ELIQUIS) 5 MG TABS tablet Take 1 tablet (5 mg total) by mouth 2 (two) times daily.  . baclofen (LIORESAL) 20 MG tablet Take 20 mg by mouth 2 (two) times daily as needed for muscle spasms.  . busPIRone (BUSPAR) 30 MG tablet Take 30 mg by mouth 2 (two) times daily.   . carvedilol (COREG) 6.25 MG tablet Take 1 tablet (6.25 mg total) by mouth 2 (two) times daily with a meal.  . digoxin (LANOXIN) 0.125 MG tablet Take 1 tablet (0.125 mg total) by mouth every other day.  . docusate sodium (COLACE) 100 MG capsule Take 3 capsules (300 mg total) by mouth at bedtime.  Marland Kitchen doxycycline (VIBRA-TABS) 100 MG  tablet Take 1 tablet (100 mg total) by mouth 2 (two) times daily.  . DULoxetine (CYMBALTA) 30 MG capsule Take 30 mg by mouth daily.  . Ferrous Fumarate 63 (20 Fe) MG TABS Take 1 tablet by mouth 2 (two) times daily.  . furosemide (LASIX) 20 MG tablet TAKE 3 TABLETS BY  MOUTH 2  TIMES DAILY. IF 1 TO 2  POUND WEIGHT GAIN IN A DAY, TAKE EXTRA 20 MG DOSE  . Garlic 619 MG CAPS Take 500 mg by mouth daily after lunch.   Marland Kitchen HUMALOG KWIKPEN 100 UNIT/ML KiwkPen Inject 14-24 Units into the skin 3 (three) times daily. On average per patient 24 unit with breakfast, 16 units with lunch, & 14-16 units with supper  . HUMALOG MIX 75/25 KWIKPEN (75-25) 100 UNIT/ML Kwikpen Inject 75 Units into the skin 2 (two) times daily.  . hyaluronate sodium (RADIAPLEXRX) GEL Apply 1 application topically 2 (two) times daily.  . hydrOXYzine (ATARAX/VISTARIL) 50 MG tablet Take 50 mg by mouth every 6 (six) hours as needed for itching ((primarily taken at bedtime)).   . IRON PO Take 24 mg by mouth daily.  . ivabradine (CORLANOR) 5 MG TABS tablet Take 1 tablet (5 mg total) by mouth 2 (two) times daily with a meal.  . levothyroxine (SYNTHROID, LEVOTHROID) 75 MCG tablet Take 75 mcg by mouth daily before breakfast.   . meclizine (ANTIVERT) 12.5 MG tablet Take 1 tablet (12.5 mg total) by mouth 3 (three) times daily as needed for dizziness.  . Melatonin 1 MG TABS Take 0.5 mg by mouth at bedtime.  . metolazone (ZAROXOLYN) 2.5 MG tablet Take 1 tablet (2.5 mg total) by mouth as needed (for weight gain >3 lbs).  . mometasone (NASONEX) 50 MCG/ACT nasal spray Place 1 spray into both nostrils daily. allergies  . Multiple Minerals (CALCIUM-MAGNESIUM-ZINC) TABS Take 1 tablet by mouth daily after lunch.   . nitroGLYCERIN (NITROSTAT) 0.4 MG SL tablet Place 1 tablet (0.4 mg total) under the tongue every 5 (five) minutes as needed. For chest pain  . non-metallic deodorant (ALRA) MISC Apply 1 application topically daily as needed.  . Omega-3 Fatty Acids (FISH OIL) 1000 MG CAPS Take 2 capsules by mouth daily.  . pantoprazole (PROTONIX) 40 MG tablet Take 40 mg by mouth 2 (two) times daily.    . polyethylene glycol powder (GLYCOLAX/MIRALAX) powder Mix 17 grams in 8 oz of water twice daily  . potassium chloride SA  (K-DUR,KLOR-CON) 20 MEQ tablet Take 4 tabs in AM and 3 tabs in PM  . Propylene Glycol (SYSTANE BALANCE) 0.6 % SOLN Place 1 drop into both eyes 3 (three) times daily.  Marland Kitchen pyridOXINE (VITAMIN B-6) 100 MG tablet Take 100 mg by mouth daily after lunch.   Marland Kitchen rOPINIRole (REQUIP) 1 MG tablet Take 1 mg by mouth at bedtime and may repeat dose one time if needed. IF RESTLESS LEGS PERSIST PATIENT MAY REPEAT ADDITIONAL DOSE  . rosuvastatin (CRESTOR) 10 MG tablet Take 10 mg by mouth at bedtime.  . sacubitril-valsartan (ENTRESTO) 24-26 MG Take 1 tablet by mouth 2 (two) times daily.  . Simethicone Extra Strength 125 MG CAPS Take 1 tablet by mouth daily as needed (for bloating). Reported on 02/23/2016  . spironolactone (ALDACTONE) 25 MG tablet Take 1 tablet (25 mg total) by mouth daily.  . SUMAtriptan (IMITREX) 100 MG tablet Take 100 mg by mouth daily as needed for migraine.   . tiotropium (SPIRIVA) 18 MCG inhalation capsule Place 18 mcg into inhaler and inhale  daily.    . traMADol (ULTRAM) 50 MG tablet Take 1 tablet by mouth as needed.  . vitamin B-12 (CYANOCOBALAMIN) 1000 MCG tablet Take 1,000 mcg by mouth daily after lunch.   . vitamin C (ASCORBIC ACID) 500 MG tablet Take 500 mg by mouth daily after lunch.   . vitamin E 400 UNIT capsule Take 400 Units by mouth daily after lunch.    No current facility-administered medications on file prior to visit.     Chief Complaint  Patient presents with  . SLEEP CONSULT    Self referral-former patient of Dr Gwenette Greet 2009. Epworth Score: 20    Cardiac tests Echo 08/21/15 >> EF 30 to 35%, mild MR  Past medical history She  has a past medical history of AICD (automatic cardioverter/defibrillator) present; Anxiety; AODM (08/13/2007); Asthma; Axillary mass, left (10/17/2016); Breast cancer ([place]) (2005, 2017); CHF (congestive heart failure) ([place]); COPD (chronic obstructive pulmonary disease) ([place]); Depression; DYSLIPIDEMIA (05/01/2009); Dysrhythmia; Family history of breast  cancer; Family history of colon cancer; Family history of uterine cancer; Furuncle (10/17/2016); Gastroparesis; GERD (gastroesophageal reflux disease); Headache; History of breast cancer (2006); Hypertension; HYPOTHYROIDISM-IATROGENIC (08/08/2008); IBS (irritable bowel syndrome); ICD (implantable cardiac defibrillator) in place (11/10/2012); Malignant neoplasm of lower-inner quadrant of left female breast ([place]) (06/26/2016); Myocardial infarction [facility]); OBESITY (07/24/2007); OBSTRUCTIVE SLEEP APNEA (07/24/2007); Osteoarthritis; Persistent atrial fibrillation ([place]) (09/2014); Personal history of radiation therapy; Presence of permanent cardiac pacemaker; RESTLESS LEG SYNDROME (34/19/3790); and SYSTOLIC HEART FAILURE, CHRONIC (01/05/2009).  Vital signs BP (!) 96/52 (BP Location: Left Arm, Cuff Size: Normal)   Pulse 91   Ht 5\' 5"  (1.651 m)   Wt 214 lb (97.1 kg)   SpO2 95%   BMI 35.61 kg/m   History of Present Illness [name] is a 61 y.o. female for evaluation of sleep problems.  She had sleep study in 2006 >> results not available.  She was found to have sleep apnea and was seen by Dr. Gwenette Greet >> last in 2009.  She has not have f/u with physician or DME for a while for her sleep apnea.  Her machine broke about 3 months ago.  Without CPAP she is getting more sleepy during the day.  She is sleeping on 3 pills.  She is snoring, and wakes up feeling like she can't breath.  She will notice her heart racing when she wakes up.    She goes to sleep at 10 to 11 pm.  She falls asleep varies from 5 minutes to 2 hours.  She wakes up 4 to 5 times to use the bathroom.  She gets out of bed at 7 am.  She feels 7 in the morning.  She denies morning headache.  She does not use anything to help her stay awake.  She takes melatonin to help sleep.  She denies sleep walking, sleep talking, bruxism, or nightmares.  There is no history of restless legs.  She denies sleep hallucinations, sleep paralysis, or  cataplexy.  The Epworth score is 20 out of 24.   Physical Exam:  General - No distress Eyes - wears glasses ENT - No sinus tenderness, no oral exudate, no LAN, no thyromegaly, TM clear, pupils equal/reactive, MP 3 Cardiac - s1s2 regular, no murmur, pulses symmetric, PM in place Chest - No wheeze/rales/dullness, good air entry, normal respiratory excursion Back - No focal tenderness Abd - Soft, non-tender, no organomegaly, + bowel sounds Ext - minimal ankle edema Neuro - Normal strength, cranial nerves intact Skin - No rashes  Psych - Normal mood, and behavior   Discussion: She has prior history of sleep apnea, and was compliant with therapy until her machine broke 3 months ago.  Without CPAP she started snoring again and having apnea.  She is feeling more sleepy during the day, and is more restless sleeper.  She likely still has sleep apnea.  We discussed how sleep apnea can affect various health problems, including risks for hypertension, cardiovascular disease, and diabetes.  We also discussed how sleep disruption can increase risks for accidents, such as while driving.  Weight loss as a means of improving sleep apnea was also reviewed.  Additional treatment options discussed were CPAP therapy, oral appliance, and surgical intervention.  Assessment/plan:  Obstructive sleep apnea. - will arrange for home sleep study to assess current status and then arrange for PAP therapy  Patient Instructions  Will arrange for home sleep study Will call to arrange for follow up after sleep study reviewed     Chesley Mires, M.D. Pager [phone] 11/27/2016, 2:06 PM

## 2016-12-09 ENCOUNTER — Ambulatory Visit (HOSPITAL_BASED_OUTPATIENT_CLINIC_OR_DEPARTMENT_OTHER)
Admission: RE | Admit: 2016-12-09 | Discharge: 2016-12-09 | Disposition: A | Discharge status: Home / Self Care | Payer: Medicare Other | Source: Ambulatory Visit | Attending: Internal Medicine | Admitting: Internal Medicine

## 2016-12-09 ENCOUNTER — Ambulatory Visit (HOSPITAL_COMMUNITY)
Admission: RE | Admit: 2016-12-09 | Discharge: 2016-12-09 | Disposition: A | Discharge status: Home / Self Care | Payer: Medicare Other | Source: Ambulatory Visit | Attending: Internal Medicine | Admitting: Internal Medicine

## 2016-12-09 VITALS — BP 112/73 | HR 97 | Wt 213.8 lb

## 2016-12-09 DIAGNOSIS — I2584 Coronary atherosclerosis due to calcified coronary lesion: Secondary | ICD-10-CM | POA: Diagnosis not present

## 2016-12-09 DIAGNOSIS — E669 Obesity, unspecified: Secondary | ICD-10-CM | POA: Diagnosis not present

## 2016-12-09 DIAGNOSIS — K589 Irritable bowel syndrome without diarrhea: Secondary | ICD-10-CM | POA: Diagnosis not present

## 2016-12-09 DIAGNOSIS — I110 Hypertensive heart disease with heart failure: Secondary | ICD-10-CM | POA: Diagnosis not present

## 2016-12-09 DIAGNOSIS — Z171 Estrogen receptor negative status [ER-]: Secondary | ICD-10-CM | POA: Diagnosis not present

## 2016-12-09 DIAGNOSIS — C50511 Malignant neoplasm of lower-outer quadrant of right female breast: Secondary | ICD-10-CM | POA: Diagnosis not present

## 2016-12-09 DIAGNOSIS — G4733 Obstructive sleep apnea (adult) (pediatric): Secondary | ICD-10-CM | POA: Diagnosis not present

## 2016-12-09 DIAGNOSIS — I2510 Atherosclerotic heart disease of native coronary artery without angina pectoris: Secondary | ICD-10-CM

## 2016-12-09 DIAGNOSIS — E785 Hyperlipidemia, unspecified: Secondary | ICD-10-CM | POA: Diagnosis not present

## 2016-12-09 DIAGNOSIS — Z87891 Personal history of nicotine dependence: Secondary | ICD-10-CM | POA: Diagnosis not present

## 2016-12-09 DIAGNOSIS — I427 Cardiomyopathy due to drug and external agent: Secondary | ICD-10-CM | POA: Diagnosis not present

## 2016-12-09 DIAGNOSIS — I5022 Chronic systolic (congestive) heart failure: Secondary | ICD-10-CM

## 2016-12-09 DIAGNOSIS — Z7901 Long term (current) use of anticoagulants: Secondary | ICD-10-CM | POA: Diagnosis not present

## 2016-12-09 DIAGNOSIS — Z95810 Presence of automatic (implantable) cardiac defibrillator: Secondary | ICD-10-CM | POA: Insufficient documentation

## 2016-12-09 DIAGNOSIS — I480 Paroxysmal atrial fibrillation: Secondary | ICD-10-CM

## 2016-12-09 DIAGNOSIS — Z853 Personal history of malignant neoplasm of breast: Secondary | ICD-10-CM | POA: Diagnosis not present

## 2016-12-09 DIAGNOSIS — C50011 Malignant neoplasm of nipple and areola, right female breast: Secondary | ICD-10-CM

## 2016-12-09 LAB — BASIC METABOLIC PANEL
ANION GAP: 8 (ref 5–15)
BUN: 17 mg/dL (ref 6–20)
CHLORIDE: 96 mmol/L — AB (ref 101–111)
CO2: 31 mmol/L (ref 22–32)
Calcium: 10 mg/dL (ref 8.9–10.3)
Creatinine, Ser: 0.98 mg/dL (ref 0.44–1.00)
GFR calc non Af Amer: >60 mL/min (ref >60)
Glucose, Bld: 200 mg/dL — ABNORMAL HIGH (ref 65–99)
POTASSIUM: 3.4 mmol/L — AB (ref 3.5–5.1)
Sodium: 135 mmol/L (ref 135–145)

## 2016-12-09 MED ORDER — SACUBITRIL-VALSARTAN 49-51 MG PO TABS: 1.0000 | ORAL_TABLET | Freq: Two times a day (BID) | ORAL | 3 refills | Status: DC

## 2016-12-09 NOTE — Progress Notes (Signed)
Patient ID: FELICHA FRAYNE, female   DOB: 05-Jun-1956, 61 y.o.   MRN: [id number]    Advanced Heart Failure Clinic Note   Endocrinologist: Dr Loanne Drilling  Oncologist: Dr. Humphrey Rolls PCP: [name], NP ([place]) Primary HF MD: Dr Haroldine Laws  HPI Ms. Mynhier is a 61 year old female with history of R breast cancer (2005, 2008), obesity, HTN, DM, IBS, HLD, OSA, chemo induced cardiomyopathy s/p ICD and chronic systolic HF.    She had chemo induced cardiomyopathy.  Her EF has fallen from 45% per Cardiac MRI in July 2010 to 20-25% in 10/2012. S/P single chamber ICD (St Jude) implantation on 11/10/12 by Dr. Rayann Heman.    Admitted 08/19/15 with chest pain. Had [facility] with mild CAD and increased filling pressures. CPX placed on hold due to breast mass. She was told to have breast biopsy ASAP with her history of breast cancer x 2. Discharged on lasix 40 mg po twice daily, carvedilol 6.25 mg twice a day, ramipril 5 mg at bed time, dig 0.125 mg daily and ivabradine 5 mg twice a day. Discharge weight was 220 pounds.    Admitted 12/21/15 with increased dyspnea and chest pain. Additionally, a new breast lump was discovered, with outpatient follow up arranged. She diuresed 15 lbs. Discharge weight 213 lbs.  Admitted 7/17 with GIB. 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 Ardis Hughs. Eliquis restarted without problem.   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 Had lumpectomy 07/24/16 with Dr. Dalbert Batman. Pending XRT. No chemo planned.  She returns today for follow up. Took a metolazone yesterday. Weight at home this am 213, baseline 209-211. Can't walk very far without SOB when her weight is up.  Can get around the whole grocery store when her weight is down.  Does occasionally have a "twinge" of chest pain. Not related to food. Happens at rest or activity. It is just a quick instance of pain. Occasional nausea. Has completed radiation approx 4 weeks ago. Taking lasix 60 mg BID with extra as  needed. Occasionally takes metolazone (1-2 x/month). Taking all medications as directed. Watching salt and fluid intake. Her CPAP is broken. She sees Dr Halford Chessman tomorrow for fitting for home sleep study/CPAP titration.  ICD interrogated personally: No VT. Thoracic impedence below threshold but trending back up after metolazone.  No AF alert, but EKG with Afib.   CPX 2017  Peak VO2: 12.7 (73% predicted peak VO2) VE/VCO2 slope: 32.3 OUES: 1.62 Peak RER: 1.08 Exercise testing with gas exchange demonstrates a mild to moderate functional impairment when compared to matched sedentary norms. At peak exercise, patient appears to have mild circulatory limitation, Her body habitus and related restrictive lung physiology are also likely contributing to her exercise intolerance. Chronotropic incompetence was also present.   RHC/LCH  08/20/2014   Mid Cx lesion, 30% stenosed.  3rd Mrg lesion, 30% stenosed. Findings: RA = 12 RV = 55/6/12 PA = 54/21 (34) PCW = 22 Ao 93/58 (71) LV 106/8/26 Fick cardiac output/index = 4.2/2.1 PVR = 2.8 SVR = 1115 FA sat = 93% PA sat = 54%, 53% Assessment: 1. Very mild CAD 2. Mild to moderately increased filling pressures 3. Severe LV dysfunction due to NICM EF 20%medications.   [place] on 01/30/12 showed decompensated HF with low cardiac output consistent with cardiogenic shock.  [place] 6/27  RA = 20  RV = 55/13/22  PA =56/34 (44)  PCW = 28  Fick cardiac output/index = 3.1/1.5  PVR = 5.3 [place]  FA sat = 91%  PA sat = 42%, 48%  SVC sat = 46%  Echo 08/12/11: Left ventricle: mildly dilated, EF 20% to 25%. Diffuse hypokinesis. Doppler parameters are consistent with restrictive physiology, indicative of decreased left ventricular diastolic compliance and/or increased left atrial pressure. Left atrium was moderately dilated. Right atrium mildly dilated. 04/09/12 ECHO EF 30-35% LV mildly dilated  Hyopkinesis 10/07/12 ECHO EF 20-25% RV ok. 03/31/14: ECHO EF 30-35%, severe  HK inferior/inferoseptal walls, mild MR, LA mod dilated, RA mildly dilated 06/28/2015: ECHO EF 25% RV mildly dilated. Severe HK inferior wall.  08/21/2015: Echo EF 30-35% Grade III DD  Myoview in 05/2010 EF 59%. No ischemia.   Review of systems complete and found to be negative unless listed in HPI.    Social History   Social History  . Marital status: Divorced    Spouse name: N/A  . Number of children: 1  . Years of education: N/A   Occupational History  . Disabled Unemployed   Social History Main Topics  . Smoking status: Former Smoker    Packs/day: 1.50    Years: 33.00    Types: Cigarettes    Quit date: 04/12/2001  . Smokeless tobacco: Never Used     Comment: heaviest - 2.5- 3PPD x 3 years   . Alcohol use No  . Drug use: No  . Sexual activity: Not Currently   Other Topics Concern  . Not on file   Social History Narrative   Lives with mother and brother Philis Nettle         Family History  Problem Relation Age of Onset  . Diabetes Mellitus II Mother   . Lung cancer Mother   . Congestive Heart Failure Mother   . Coronary artery disease Father     CABG x 3  . Hypertension Father   . Diabetes Mellitus II Father   . Breast cancer Cousin 91    maternal first cousin  . Colon cancer Cousin 58    maternal first cousin  . Uterine cancer Cousin     dx in her 57s  . Colon cancer Maternal Aunt     dx in her late 29s  . Uterine cancer Maternal Aunt 66  . Lung cancer Maternal Aunt   . Uterine cancer Maternal Aunt 38  . Colon cancer Maternal Aunt 47  . Breast cancer Maternal Grandmother 20  . Uterine cancer Maternal Aunt 48  . Uterine cancer Cousin     dx in her 50s  . Uterine cancer Cousin 82    Past Medical History:  Diagnosis Date  . AICD (automatic cardioverter/defibrillator) present    st jude  . Anxiety   . 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 Asthma   . Axillary mass, left 10/17/2016  . Breast cancer ([place]) 2005, 2017  . CHF (congestive heart failure) ([place])   . COPD  (chronic obstructive pulmonary disease) ([place])   . Depression    followed by a psychiatrist  . DYSLIPIDEMIA 05/01/2009  . Dysrhythmia    atrial fibrillation  . Family history of breast cancer   . Family history of colon cancer   . Family history of uterine cancer   . [place] 10/17/2016  . Gastroparesis   . GERD (gastroesophageal reflux disease)   . Headache   . History of breast cancer 2006   with return in 2008, s/p mastectomy, XRT and chemotherapy  . Hypertension   . 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 IBS (irritable bowel syndrome)   .  ICD (implantable cardiac defibrillator) in place 11/10/2012   ICD (11/2012)  . Malignant neoplasm of lower-inner quadrant of left female breast ([place]) 06/26/2016  . Myocardial infarction ([place])   . OBESITY 07/24/2007  . OBSTRUCTIVE SLEEP APNEA 07/24/2007   with CPAP compliance  . Osteoarthritis   . Persistent atrial fibrillation ([place]) 09/2014  . Personal history of radiation therapy    current  . Presence of permanent cardiac pacemaker   . RESTLESS LEG SYNDROME 07/24/2007  . SYSTOLIC HEART FAILURE, CHRONIC 01/05/2009   a. chemo induced cardiomyopathy b. cMRI (02/2009) EF 45% c. Myoview 05/2010: EF 59%, no ischemia d. RHC (01/2012) RA 20, RV 55/13/22, PA 56/34 (44), PCWP 28, Fick CO/CI: 3.1 / 1.5, PVR 5.3 WU, PA sat 42% & 48% e. ECHO (10/2012) EF 20-25%, diff HK, MV calcified annulus f. ECHO (03/2014) EF 30-35%, sev HK, inferior/inferoseptal walls, mild MR    Current Outpatient Prescriptions  Medication Sig Dispense Refill  . acetaminophen (TYLENOL) 650 MG CR tablet Take 1,300 mg by mouth every 8 (eight) hours as needed for pain.    Marland Kitchen albuterol (PROVENTIL HFA) 108 (90 BASE) MCG/ACT inhaler Inhale 2 puffs into the lungs every 6 (six) hours as needed. For shortness of breath    . ALPRAZolam (XANAX) 0.5 MG tablet Take 0.25 mg by mouth 4 (four) times daily as needed for anxiety.     Marland Kitchen amiodarone (PACERONE) 200 MG tablet Take 1 tablet (200 mg total) by mouth  daily. 90 tablet 3  . apixaban (ELIQUIS) 5 MG TABS tablet Take 1 tablet (5 mg total) by mouth 2 (two) times daily. 180 tablet 3  . baclofen (LIORESAL) 20 MG tablet Take 20 mg by mouth 2 (two) times daily as needed for muscle spasms.    . busPIRone (BUSPAR) 30 MG tablet Take 30 mg by mouth 2 (two) times daily.     . Calcium Carb-Cholecalciferol (CALCIUM 600+D3) 600-800 MG-UNIT TABS Take by mouth daily.    . carvedilol (COREG) 6.25 MG tablet Take 1 tablet (6.25 mg total) by mouth 2 (two) times daily with a meal. 180 tablet 3  . digoxin (LANOXIN) 0.125 MG tablet Take 1 tablet (0.125 mg total) by mouth every other day. 45 tablet 3  . docusate sodium (COLACE) 100 MG capsule Take 3 capsules (300 mg total) by mouth at bedtime. 10 capsule 0  . doxycycline (VIBRA-TABS) 100 MG tablet Take 1 tablet (100 mg total) by mouth 2 (two) times daily. 60 tablet 1  . DULoxetine (CYMBALTA) 30 MG capsule Take 30 mg by mouth daily.    . Ferrous Fumarate 63 (20 Fe) MG TABS Take 1 tablet by mouth 2 (two) times daily.    . furosemide (LASIX) 20 MG tablet TAKE 3 TABLETS BY MOUTH 2  TIMES DAILY. IF 1 TO 2  POUND WEIGHT GAIN IN A DAY, TAKE EXTRA 20 MG DOSE 540 tablet 3  . Garlic 423 MG CAPS Take 500 mg by mouth daily after lunch.     Marland Kitchen HUMALOG KWIKPEN 100 UNIT/ML KiwkPen Inject 14-24 Units into the skin 3 (three) times daily. On average per patient 24 unit with breakfast, 16 units with lunch, & 14-16 units with supper    . HUMALOG MIX 75/25 KWIKPEN (75-25) 100 UNIT/ML Kwikpen Inject 75 Units into the skin 2 (two) times daily.    . hyaluronate sodium (RADIAPLEXRX) GEL Apply 1 application topically 2 (two) times daily.    . hydrOXYzine (ATARAX/VISTARIL) 50 MG tablet Take 50 mg by mouth every  6 (six) hours as needed for itching ((primarily taken at bedtime)).     . IRON PO Take 24 mg by mouth daily.    . ivabradine (CORLANOR) 5 MG TABS tablet Take 1 tablet (5 mg total) by mouth 2 (two) times daily with a meal. 180 tablet 3  .  levothyroxine (SYNTHROID, LEVOTHROID) 75 MCG tablet Take 75 mcg by mouth daily before breakfast.     . meclizine (ANTIVERT) 12.5 MG tablet Take 1 tablet (12.5 mg total) by mouth 3 (three) times daily as needed for dizziness. 20 tablet 0  . Melatonin 1 MG TABS Take 0.5 mg by mouth at bedtime.    . metolazone (ZAROXOLYN) 2.5 MG tablet Take 1 tablet (2.5 mg total) by mouth as needed (for weight gain >3 lbs). 45 tablet 3  . mometasone (NASONEX) 50 MCG/ACT nasal spray Place 1 spray into both nostrils daily. allergies    . Multiple Minerals (CALCIUM-MAGNESIUM-ZINC) TABS Take 1 tablet by mouth daily after lunch.     . nitroGLYCERIN (NITROSTAT) 0.4 MG SL tablet Place 1 tablet (0.4 mg total) under the tongue every 5 (five) minutes as needed. For chest pain 25 tablet 3  . non-metallic deodorant (ALRA) MISC Apply 1 application topically daily as needed.    . Omega-3 Fatty Acids (FISH OIL) 1000 MG CAPS Take 2 capsules by mouth daily.    . pantoprazole (PROTONIX) 40 MG tablet Take 40 mg by mouth 2 (two) times daily.      Vladimir Faster Glycol-Propyl Glycol (SYSTANE OP) Apply to eye as needed.    . polyethylene glycol powder (GLYCOLAX/MIRALAX) powder Mix 17 grams in 8 oz of water twice daily 850 g 3  . potassium chloride SA (K-DUR,KLOR-CON) 20 MEQ tablet Take 4 tabs in AM and 3 tabs in PM 36 tablet 0  . Propylene Glycol (SYSTANE BALANCE) 0.6 % SOLN Place 1 drop into both eyes 3 (three) times daily.    Marland Kitchen pyridOXINE (VITAMIN B-6) 100 MG tablet Take 100 mg by mouth daily after lunch.     Marland Kitchen rOPINIRole (REQUIP) 1 MG tablet Take 1 mg by mouth at bedtime and may repeat dose one time if needed. IF RESTLESS LEGS PERSIST PATIENT MAY REPEAT ADDITIONAL DOSE    . rosuvastatin (CRESTOR) 10 MG tablet Take 10 mg by mouth at bedtime.    . sacubitril-valsartan (ENTRESTO) 24-26 MG Take 1 tablet by mouth 2 (two) times daily. 60 tablet 3  . Simethicone Extra Strength 125 MG CAPS Take 1 tablet by mouth daily as needed (for bloating).  Reported on 02/23/2016    . spironolactone (ALDACTONE) 25 MG tablet Take 1 tablet (25 mg total) by mouth daily. 90 tablet 3  . SUMAtriptan (IMITREX) 100 MG tablet Take 100 mg by mouth daily as needed for migraine.     . tiotropium (SPIRIVA) 18 MCG inhalation capsule Place 18 mcg into inhaler and inhale daily.      . traMADol (ULTRAM) 50 MG tablet Take 1 tablet by mouth as needed.    . vitamin B-12 (CYANOCOBALAMIN) 1000 MCG tablet Take 1,000 mcg by mouth daily after lunch.     . vitamin C (ASCORBIC ACID) 500 MG tablet Take 500 mg by mouth daily after lunch.     . vitamin E 400 UNIT capsule Take 400 Units by mouth daily after lunch.      No current facility-administered medications for this encounter.    Vitals:   12/09/16 1129  BP: 112/73  Pulse: 97  SpO2: 97%  Weight: 213 lb 12.8 oz (97 kg)   Wt Readings from Last 3 Encounters:  12/09/16 213 lb 12.8 oz (97 kg)  11/27/16 214 lb (97.1 kg)  11/05/16 217 lb (98.4 kg)     PHYSICAL EXAM: General: Well appearing. No resp difficulty. HEENT: normal Neck: supple. JVD 7-8 cm Carotids 2+ bilat; no bruits. No thyromegaly or nodule noted. Cor: PMI nondisplaced. Irregular. No M/G/R noted Lungs: CTAB, normal effort. Abdomen: soft, non-tender, distended, no HSM. No bruits or masses. +BS  Extremities: no cyanosis, clubbing, or rash. 1-2+ ankle edema bilaterally. Trace edema 1/2 way to knees.   Neuro: alert & orientedx3, cranial nerves grossly intact. moves all 4 extremities w/o difficulty. Affect pleasant   ASSESSMENT & PLAN:  1) Chronic systolic HF, NICM thought to be chemo induced, s/p ST Jude  ICD. Most Recent ECHO 06/28/2015  EF ~20%. RV mildly dilated. Echo 1/17 30-35% - NYHA III symptoms.  - Echo today shows EF down, possibly as low as 15%. RV OK.  - Continue lasix 60 mg BID with extra as needed. Took metolazone this am - Continue metolazone 2.5 mg as needed with extra 40 meq of potassium.  - Continue spiro 25 mg daily.  - Continue  coreg 6.25 mg BID  - Increase Entresto 49/51 mg BID.  - Continue digoxin 0.125 mg daily .  - Continue corlanor 5 mg twice a day.    - CPX result with mild -mod circulatory limitation in 1/16. Will repeat with drop in EF for possible Advanced therapy consideration.  - Reinforced fluid restriction to < 2 L daily, sodium restriction to less than 2000 mg daily, and the importance of daily weights.   2) HTN - Stable on current meds. Meds as above. 3) OSA - Machine broken. Has appt tomorrow to get re-fit.  4) HLD - Followed by PCP.  5) Obesity - Encouraged to improve activity and watch portion size.  6) CAD - mild nonobstructive CAD on 2017 cath. - No S/S ischemia. Continue statin, BB, and ACE-I.  7) NSVT - Continue amio 200 mg daily. Needs yearly eye exam. TSH OK earlier this year.  8) Breast Cancer - s/p lumpectomy and Radiation 9) Paroxysmal Afib -  EKG with AF controlled rate of 85 bpm.  - Continue Eliquis 5 mg BID. No bleeding.  - CHADsVASC 4 (HTN, CAD, HF, gender)  Shirley Friar, PA-C  11:53 AM  Patient seen and examined with the above-signed Advanced Practice Provider and/or Housestaff. I personally reviewed laboratory data, imaging studies and relevant notes. I independently examined the patient and formulated the important aspects of the plan. I have edited the note to reflect any of my changes or salient points. I have personally discussed the plan with the patient and/or family.  Clinically feeling well. NYHA II-early III and volume status looks ok but echo reviewed personally and LVEF 15% (RV ok). Has recently finished breast cancer therapy so advanced therapy options are limited but will get CPX testing to further risk stratify. Increase Entresto. Back in AF today. Is tolerating well. Rate is controlled. Will continue Eliquis.Continue amio for now. Can consider stopping if remains in AF.    Glori Bickers, MD  11:02 PM

## 2016-12-09 NOTE — Patient Instructions (Signed)
Increase Entresto to 49/51 mg Twice daily   Labs today  Labs in 2 weeks  Your physician has recommended that you have a cardiopulmonary stress test (CPX). CPX testing is a non-invasive measurement of heart and lung function. It replaces a traditional treadmill stress test. This type of test provides a tremendous amount of information that relates not only to your present condition but also for future outcomes. This test combines measurements of you ventilation, respiratory gas exchange in the lungs, electrocardiogram (EKG), blood pressure and physical response before, during, and following an exercise protocol.  Your physician recommends that you schedule a follow-up appointment in: 6 weeks

## 2016-12-10 DIAGNOSIS — G4733 Obstructive sleep apnea (adult) (pediatric): Secondary | ICD-10-CM | POA: Diagnosis not present

## 2016-12-11 ENCOUNTER — Telehealth: Payer: Self-pay | Admitting: Pulmonary Disease

## 2016-12-11 ENCOUNTER — Telehealth (HOSPITAL_COMMUNITY): Payer: Self-pay

## 2016-12-11 DIAGNOSIS — G4733 Obstructive sleep apnea (adult) (pediatric): Secondary | ICD-10-CM

## 2016-12-11 MED ORDER — POTASSIUM CHLORIDE CRYS ER 20 MEQ PO TBCR: 80.0000 meq | EXTENDED_RELEASE_TABLET | Freq: Two times a day (BID) | ORAL | 3 refills | Status: DC

## 2016-12-11 NOTE — Telephone Encounter (Signed)
HST 12/10/16 >> AHI 22.8, SaO2 low 83%   Will have my nurse inform pt that sleep study shows moderate sleep apnea.  Options are 1) CPAP now, 2) ROV first.  If She is agreeable to CPAP, then please send order for auto CPAP range 5 to 15 cm H2O with heated humidity and mask of choice.  Have download sent 1 month after starting CPAP and set up ROV 2 months after starting CPAP.

## 2016-12-12 ENCOUNTER — Other Ambulatory Visit: Payer: Self-pay | Admitting: *Deleted

## 2016-12-12 DIAGNOSIS — I5022 Chronic systolic (congestive) heart failure: Secondary | ICD-10-CM

## 2016-12-12 DIAGNOSIS — J441 Chronic obstructive pulmonary disease with (acute) exacerbation: Secondary | ICD-10-CM

## 2016-12-13 NOTE — Patient Outreach (Signed)
[facility]) Care Management  12/13/2016   [name] Jun 20, 1956 [id number]  RN Health Coach telephone call to patient.  [employer] compliance verified. Per patient she is weighing everyday. She only has swelling in one ankle and she stated she thinks that is due to her haven broken it in the past.  She is taking her medications as prescribed. Per patient she is on over 20 mediations. Per patient her cpap has broken and she has not been using it for several month. Patient stated that when she went back for her cardiac exam and was told that her ejection fraction has decreased.  Per patient she was wondering if possibly it had gotten worse since she was unable to use her CPAP. Patient stated she is unable to do any exercises. RN discussed chair exercises. Patient stated that her appetite is not very good. Per patient she is trying to eat healthy. She is eating mostly chicken. She is baking her foods. She trying to eat foods low in sodium and is reading labels and counting with her meals. She eats mostly frozen vegetables.  Patient is walking with a cane. She is having some pain in her knee. She is elevating the knee. RN discussed with patient about cardiac rehab and silver sneaker programs. Per patient she has had some episodes of wetting the bed so she is using briefs. Patient has agreed to follow up outreach calls.    Current Medications:  Current Outpatient Prescriptions  Medication Sig Dispense Refill  . acetaminophen (TYLENOL) 650 MG CR tablet Take 1,300 mg by mouth every 8 (eight) hours as needed for pain.    Marland Kitchen albuterol (PROVENTIL HFA) 108 (90 BASE) MCG/ACT inhaler Inhale 2 puffs into the lungs every 6 (six) hours as needed. For shortness of breath    . ALPRAZolam (XANAX) 0.5 MG tablet Take 0.25 mg by mouth 4 (four) times daily as needed for anxiety.     Marland Kitchen amiodarone (PACERONE) 200 MG tablet Take 1 tablet (200 mg total) by mouth daily. 90 tablet 3  . apixaban (ELIQUIS) 5  MG TABS tablet Take 1 tablet (5 mg total) by mouth 2 (two) times daily. 180 tablet 3  . baclofen (LIORESAL) 20 MG tablet Take 20 mg by mouth 2 (two) times daily as needed for muscle spasms.    . busPIRone (BUSPAR) 30 MG tablet Take 30 mg by mouth 2 (two) times daily.     . Calcium Carb-Cholecalciferol (CALCIUM 600+D3) 600-800 MG-UNIT TABS Take by mouth daily.    . carvedilol (COREG) 6.25 MG tablet Take 1 tablet (6.25 mg total) by mouth 2 (two) times daily with a meal. 180 tablet 3  . digoxin (LANOXIN) 0.125 MG tablet Take 1 tablet (0.125 mg total) by mouth every other day. 45 tablet 3  . docusate sodium (COLACE) 100 MG capsule Take 3 capsules (300 mg total) by mouth at bedtime. 10 capsule 0  . doxycycline (VIBRA-TABS) 100 MG tablet Take 1 tablet (100 mg total) by mouth 2 (two) times daily. 60 tablet 1  . DULoxetine (CYMBALTA) 30 MG capsule Take 30 mg by mouth daily.    . Ferrous Fumarate 63 (20 Fe) MG TABS Take 1 tablet by mouth 2 (two) times daily.    . furosemide (LASIX) 20 MG tablet TAKE 3 TABLETS BY MOUTH 2  TIMES DAILY. IF 1 TO 2  POUND WEIGHT GAIN IN A DAY, TAKE EXTRA 20 MG DOSE 540 tablet 3  . Garlic 195 MG CAPS Take 500  mg by mouth daily after lunch.     Marland Kitchen HUMALOG KWIKPEN 100 UNIT/ML KiwkPen Inject 14-24 Units into the skin 3 (three) times daily. On average per patient 24 unit with breakfast, 16 units with lunch, & 14-16 units with supper    . HUMALOG MIX 75/25 KWIKPEN (75-25) 100 UNIT/ML Kwikpen Inject 75 Units into the skin 2 (two) times daily.    . hyaluronate sodium (RADIAPLEXRX) GEL Apply 1 application topically 2 (two) times daily.    . hydrOXYzine (ATARAX/VISTARIL) 50 MG tablet Take 50 mg by mouth every 6 (six) hours as needed for itching ((primarily taken at bedtime)).     . IRON PO Take 24 mg by mouth daily.    . ivabradine (CORLANOR) 5 MG TABS tablet Take 1 tablet (5 mg total) by mouth 2 (two) times daily with a meal. 180 tablet 3  . levothyroxine (SYNTHROID, LEVOTHROID) 75 MCG  tablet Take 75 mcg by mouth daily before breakfast.     . meclizine (ANTIVERT) 12.5 MG tablet Take 1 tablet (12.5 mg total) by mouth 3 (three) times daily as needed for dizziness. 20 tablet 0  . Melatonin 1 MG TABS Take 0.5 mg by mouth at bedtime.    . metolazone (ZAROXOLYN) 2.5 MG tablet Take 1 tablet (2.5 mg total) by mouth as needed (for weight gain >3 lbs). 45 tablet 3  . mometasone (NASONEX) 50 MCG/ACT nasal spray Place 1 spray into both nostrils daily. allergies    . Multiple Minerals (CALCIUM-MAGNESIUM-ZINC) TABS Take 1 tablet by mouth daily after lunch.     . nitroGLYCERIN (NITROSTAT) 0.4 MG SL tablet Place 1 tablet (0.4 mg total) under the tongue every 5 (five) minutes as needed. For chest pain 25 tablet 3  . non-metallic deodorant (ALRA) MISC Apply 1 application topically daily as needed.    . Omega-3 Fatty Acids (FISH OIL) 1000 MG CAPS Take 2 capsules by mouth daily.    . pantoprazole (PROTONIX) 40 MG tablet Take 40 mg by mouth 2 (two) times daily.      Vladimir Faster Glycol-Propyl Glycol (SYSTANE OP) Apply to eye as needed.    . polyethylene glycol powder (GLYCOLAX/MIRALAX) powder Mix 17 grams in 8 oz of water twice daily 850 g 3  . potassium chloride SA (K-DUR,KLOR-CON) 20 MEQ tablet Take 4 tablets (80 mEq total) by mouth 2 (two) times daily. 720 tablet 3  . Propylene Glycol (SYSTANE BALANCE) 0.6 % SOLN Place 1 drop into both eyes 3 (three) times daily.    Marland Kitchen pyridOXINE (VITAMIN B-6) 100 MG tablet Take 100 mg by mouth daily after lunch.     Marland Kitchen rOPINIRole (REQUIP) 1 MG tablet Take 1 mg by mouth at bedtime and may repeat dose one time if needed. IF RESTLESS LEGS PERSIST PATIENT MAY REPEAT ADDITIONAL DOSE    . rosuvastatin (CRESTOR) 10 MG tablet Take 10 mg by mouth at bedtime.    . sacubitril-valsartan (ENTRESTO) 49-51 MG Take 1 tablet by mouth 2 (two) times daily. 60 tablet 3  . Simethicone Extra Strength 125 MG CAPS Take 1 tablet by mouth daily as needed (for bloating). Reported on 02/23/2016     . spironolactone (ALDACTONE) 25 MG tablet Take 1 tablet (25 mg total) by mouth daily. 90 tablet 3  . SUMAtriptan (IMITREX) 100 MG tablet Take 100 mg by mouth daily as needed for migraine.     . tiotropium (SPIRIVA) 18 MCG inhalation capsule Place 18 mcg into inhaler and inhale daily.      Marland Kitchen  traMADol (ULTRAM) 50 MG tablet Take 1 tablet by mouth as needed.    . vitamin B-12 (CYANOCOBALAMIN) 1000 MCG tablet Take 1,000 mcg by mouth daily after lunch.     . vitamin C (ASCORBIC ACID) 500 MG tablet Take 500 mg by mouth daily after lunch.     . vitamin E 400 UNIT capsule Take 400 Units by mouth daily after lunch.      No current facility-administered medications for this visit.     Functional Status:  In your present state of health, do you have any difficulty performing the following activities: 12/12/2016 12/12/2016  Hearing? - -  Vision? - N  Difficulty concentrating or making decisions? - N  Walking or climbing stairs? - Y  Dressing or bathing? - N  Doing errands, shopping? - Y  Preparing Food and eating ? N -  Using the Toilet? Y -  In the past six months, have you accidently leaked urine? Y -  Do you have problems with loss of bowel control? N -  Managing your Medications? N -  Managing your Finances? N -  Housekeeping or managing your Housekeeping? N -  Some recent data might be hidden    Fall/Depression Screening: Fall Risk  12/12/2016 08/08/2016 03/14/2016  Falls in the past year? Yes Yes Yes  Number falls in past yr: - 1 1  Injury with Fall? - No No  Risk Factor Category  - - -  Risk for fall due to : Impaired balance/gait;Impaired mobility - History of fall(s)  Follow up - - Falls prevention discussed;Education provided   [facility] 2/9 Scores 12/12/2016 08/08/2016 03/14/2016 01/24/2016 01/02/2016  PHQ - 2 Score 0 0 2 0 0  PHQ- 9 Score - - 7 - -   THN CM Care Plan Problem One     Most Recent Value  Care Plan Problem One  Knowledge Deficit in Self Management of Congestive Heart Failure   Role Documenting the Problem One  [place] for Problem One  Active  [facility]N Long Term Goal (31-90 days)  Patient will not have any admissions due to congestive heart within the next 90 days  [facility]N Long Term Goal Start Date  12/13/16  Interventions for Problem One Long Term Goal  Patient will continue to weigh daily and follow up with action plan if gain greater than 2 pounds in 1 day of 5 pounds in a week. RN discussed medication adherence. RN will follow up with discussion and teachback  [facility] CM Short Term Goal #1 (0-30 days)  Patient will follow up with sleep study results and getting a new cpap within the next 30 days  [facility]N CM Short Term Goal #1 Start Date  12/13/16  Interventions for Short Term Goal #1  RN and patient discussed complications from not using cpap. RN sent EMMI educational material on cpap      Assessment: Patient ejection fraction has decreased Patient will continue to benefit from [place] telephonic outreach for education and support for Congestive Heart Failure self management.  Plan:  Referred to pharmacy RN sent nutritional coupons on Glucerna RN sent educational material on chair exercises RN will follow up outreach within the month of June RN discussed with patient about cardiac rehab and silver sneaker programs [place] Care Management [phone]

## 2016-12-16 ENCOUNTER — Telehealth: Payer: Self-pay | Admitting: *Deleted

## 2016-12-16 DIAGNOSIS — G4733 Obstructive sleep apnea (adult) (pediatric): Secondary | ICD-10-CM | POA: Diagnosis not present

## 2016-12-16 NOTE — Telephone Encounter (Signed)
Pt calling for RadOnc appt. Informed of 5/24 appt.

## 2016-12-16 NOTE — Telephone Encounter (Signed)
error 

## 2016-12-17 ENCOUNTER — Other Ambulatory Visit: Payer: Self-pay | Admitting: *Deleted

## 2016-12-17 DIAGNOSIS — Z0000 Encounter for general adult medical examination without abnormal findings: Secondary | ICD-10-CM | POA: Diagnosis not present

## 2016-12-17 DIAGNOSIS — G4733 Obstructive sleep apnea (adult) (pediatric): Secondary | ICD-10-CM

## 2016-12-17 DIAGNOSIS — Z1389 Encounter for screening for other disorder: Secondary | ICD-10-CM | POA: Diagnosis not present

## 2016-12-17 DIAGNOSIS — E785 Hyperlipidemia, unspecified: Secondary | ICD-10-CM | POA: Diagnosis not present

## 2016-12-17 DIAGNOSIS — Z9181 History of falling: Secondary | ICD-10-CM | POA: Diagnosis not present

## 2016-12-18 ENCOUNTER — Encounter: Payer: Self-pay | Admitting: *Deleted

## 2016-12-20 ENCOUNTER — Telehealth: Payer: Self-pay | Admitting: Cardiology

## 2016-12-20 ENCOUNTER — Other Ambulatory Visit: Payer: Self-pay | Admitting: Pharmacist

## 2016-12-20 NOTE — Telephone Encounter (Signed)
ATC x 1, pt having trouble with phones

## 2016-12-20 NOTE — Telephone Encounter (Signed)
lmtcb x1 for pt. 

## 2016-12-20 NOTE — Telephone Encounter (Signed)
Spoke with pt's sister and made her aware of results.  Lenore states pt's had a cpap previously and would like to proceed to cpap therapy. Order has been placed. Pt has been scheduled for 04/04/17, as it was first available with [facility].  Nothing further needed.

## 2016-12-20 NOTE — Telephone Encounter (Signed)
Patient is having phone difficulties and asked her sister, Ellard Artis (who is listed on patient's DPR) to call our office and get the results for her.  Ashtyn was busy and could not speak with her and asked to put a message back.  Please call patient's sister, [name] number is [phone]. Advised her to stay by the phone today.

## 2016-12-20 NOTE — Telephone Encounter (Signed)
Attempted to call pt b/c her home monitor has not been updated in the last 7 days. No answer and unable to leave a message.

## 2016-12-20 NOTE — Telephone Encounter (Signed)
Patient returning call, [phone] (home) and [phone] (cell).  Try home phone first and if cannot reach, please try her on her cell.

## 2016-12-20 NOTE — Patient Outreach (Signed)
[name] was referred to pharmacy for medication review. Left a HIPAA compliant message on the patient's voicemail. If have not heard from patient by next week, will give her another call at that time.  Harlow Asa, PharmD, [place] Management [phone]

## 2016-12-20 NOTE — Telephone Encounter (Signed)
Patient is calling for results of sleep study and would like to know when she is getting machine.  CB is [phone].

## 2016-12-21 ENCOUNTER — Encounter (HOSPITAL_COMMUNITY): Payer: Self-pay | Admitting: Emergency Medicine

## 2016-12-21 ENCOUNTER — Emergency Department (HOSPITAL_COMMUNITY): Payer: Medicare Other

## 2016-12-21 ENCOUNTER — Emergency Department (HOSPITAL_COMMUNITY)
Admission: EM | Admit: 2016-12-21 | Discharge: 2016-12-21 | Disposition: A | Discharge status: Home / Self Care | Payer: Medicare Other | Attending: Emergency Medicine | Admitting: Emergency Medicine

## 2016-12-21 DIAGNOSIS — I252 Old myocardial infarction: Secondary | ICD-10-CM | POA: Insufficient documentation

## 2016-12-21 DIAGNOSIS — F419 Anxiety disorder, unspecified: Secondary | ICD-10-CM | POA: Diagnosis not present

## 2016-12-21 DIAGNOSIS — R079 Chest pain, unspecified: Secondary | ICD-10-CM

## 2016-12-21 DIAGNOSIS — R069 Unspecified abnormalities of breathing: Secondary | ICD-10-CM | POA: Diagnosis not present

## 2016-12-21 DIAGNOSIS — Z87891 Personal history of nicotine dependence: Secondary | ICD-10-CM | POA: Insufficient documentation

## 2016-12-21 DIAGNOSIS — R0602 Shortness of breath: Secondary | ICD-10-CM | POA: Diagnosis not present

## 2016-12-21 DIAGNOSIS — I110 Hypertensive heart disease with heart failure: Secondary | ICD-10-CM | POA: Diagnosis not present

## 2016-12-21 DIAGNOSIS — I5023 Acute on chronic systolic (congestive) heart failure: Secondary | ICD-10-CM | POA: Diagnosis not present

## 2016-12-21 DIAGNOSIS — E109 Type 1 diabetes mellitus without complications: Secondary | ICD-10-CM | POA: Diagnosis not present

## 2016-12-21 DIAGNOSIS — Z853 Personal history of malignant neoplasm of breast: Secondary | ICD-10-CM | POA: Insufficient documentation

## 2016-12-21 DIAGNOSIS — Z79899 Other long term (current) drug therapy: Secondary | ICD-10-CM | POA: Insufficient documentation

## 2016-12-21 DIAGNOSIS — E1065 Type 1 diabetes mellitus with hyperglycemia: Secondary | ICD-10-CM | POA: Diagnosis not present

## 2016-12-21 DIAGNOSIS — I5022 Chronic systolic (congestive) heart failure: Secondary | ICD-10-CM | POA: Insufficient documentation

## 2016-12-21 DIAGNOSIS — Z7901 Long term (current) use of anticoagulants: Secondary | ICD-10-CM | POA: Diagnosis not present

## 2016-12-21 DIAGNOSIS — I2510 Atherosclerotic heart disease of native coronary artery without angina pectoris: Secondary | ICD-10-CM | POA: Insufficient documentation

## 2016-12-21 DIAGNOSIS — J449 Chronic obstructive pulmonary disease, unspecified: Secondary | ICD-10-CM | POA: Diagnosis not present

## 2016-12-21 DIAGNOSIS — E039 Hypothyroidism, unspecified: Secondary | ICD-10-CM | POA: Insufficient documentation

## 2016-12-21 DIAGNOSIS — R0789 Other chest pain: Secondary | ICD-10-CM | POA: Diagnosis not present

## 2016-12-21 DIAGNOSIS — Z95810 Presence of automatic (implantable) cardiac defibrillator: Secondary | ICD-10-CM | POA: Diagnosis not present

## 2016-12-21 LAB — BASIC METABOLIC PANEL
ANION GAP: 11 (ref 5–15)
BUN: 12 mg/dL (ref 6–20)
CALCIUM: 9.4 mg/dL (ref 8.9–10.3)
CO2: 25 mmol/L (ref 22–32)
CREATININE: 0.96 mg/dL (ref 0.44–1.00)
Chloride: 100 mmol/L — ABNORMAL LOW (ref 101–111)
GFR calc Af Amer: >60 mL/min (ref >60)
GLUCOSE: 192 mg/dL — AB (ref 65–99)
Potassium: 3.8 mmol/L (ref 3.5–5.1)
Sodium: 136 mmol/L (ref 135–145)

## 2016-12-21 LAB — CBC
HCT: 44.9 % (ref 36.0–46.0)
HEMOGLOBIN: 14.5 g/dL (ref 12.0–15.0)
MCH: 29.3 pg (ref 26.0–34.0)
MCHC: 32.3 g/dL (ref 30.0–36.0)
MCV: 90.7 fL (ref 78.0–100.0)
Platelets: 206 10*3/uL (ref 150–400)
RBC: 4.95 MIL/uL (ref 3.87–5.11)
RDW: 16.7 % — ABNORMAL HIGH (ref 11.5–15.5)
WBC: 8.5 10*3/uL (ref 4.0–10.5)

## 2016-12-21 LAB — I-STAT TROPONIN, ED
Troponin i, poc: 0 ng/mL (ref 0.00–0.08)
Troponin i, poc: 0 ng/mL (ref 0.00–0.08)

## 2016-12-21 LAB — BRAIN NATRIURETIC PEPTIDE: B Natriuretic Peptide: 474.9 pg/mL — ABNORMAL HIGH (ref 0.0–100.0)

## 2016-12-21 MED ORDER — FUROSEMIDE 10 MG/ML IJ SOLN
80.0000 mg | Freq: Once | INTRAMUSCULAR | Status: AC
  Administered 2016-12-21: 80 mg via INTRAVENOUS
  Filled 2016-12-21: qty 8

## 2016-12-21 MED ORDER — METOCLOPRAMIDE HCL 5 MG/ML IJ SOLN
5.0000 mg | Freq: Once | INTRAMUSCULAR | Status: AC
  Administered 2016-12-21: 5 mg via INTRAVENOUS
  Filled 2016-12-21: qty 2

## 2016-12-21 NOTE — ED Notes (Signed)
Patient transported to X-ray 

## 2016-12-21 NOTE — Consult Note (Signed)
Cardiology Consultation:   Patient ID: [name]; [id number]; 1956-05-12   Admit date: 12/21/2016 Date of Consult: 12/21/2016  Primary Care Provider: Lowella Dandy, NP Primary Cardiologist: Dr. Haroldine Laws Primary Electrophysiologist:  Dr. Rayann Heman   Patient Profile:   [name] is a 61 y.o. female with a hx of R breast cancer (2005, 2008), obesity, HTN, DM, IBS, HLD, hx of GI bleed due to SB AVMs (02/2016- followed by Dr. Ardis Hughs), OSA, chemo induced cardiomyopathy s/p ICD and chronic systolic HF who is being seen today for the evaluation of shortens of breath and cp at the request of Dr. Winfred Leeds.   She had chemo induced cardiomyopathy.  Her EF has fallen from 45% per Cardiac MRI in July 2010 to 20-25% in 10/2012. S/P single chamber ICD (St Jude) implantation on 11/10/12 by Dr. Rayann Heman.  Had [facility] with mild CAD and increased filling pressures.   Last seen by Dr. Haroldine Laws 12/09/16. Continued to have SOB. Echo 5/7 showed further reduced LVEF to 15%, diffuse hypokinesis. PA pressure of 40 mm Hg. ICD interrogated during visit showed No VT. Thoracic impedence below threshold but trending back up after metolazone.  No AF alert, but EKG with Afib. Plan to get CPX for risk stratifications. Options limited due to recent breast cancer therapy. Can discontinue amio if remains in afib.  Entresto increased.   History of Present Illness:   MAIZE BRITTINGHAM presented to ER for worsening dyspnea x 3 days. She gets dyspneic with minimal ambulation. Has has orthopnea and PND. No chest pain or le edema. She had an episode of left sided CP this morning. Resolved with nitro x 1. However she came for evaluation of SOB. She broke her CPAP machine 3 months ago. Had sleep study and will get new CPAP next week. She does have intermittent dizziness and palpitaions. No syncope. EKG showed sinus vs ectopic rhythm. No acute ischemic changes. Telemetry showed intermittent afib at 120s. Poc troponin negative.  Electrolytes and scr normal. CXR without acute findings. Pending BNP.   Past Medical History:  Diagnosis Date  . AICD (automatic cardioverter/defibrillator) present    st jude  . Anxiety   . 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 Asthma   . Axillary mass, left 10/17/2016  . Breast cancer ([place]) 2005, 2017  . CHF (congestive heart failure) ([place])   . COPD (chronic obstructive pulmonary disease) ([place])   . Depression    followed by a psychiatrist  . DYSLIPIDEMIA 05/01/2009  . Dysrhythmia    atrial fibrillation  . Family history of breast cancer   . Family history of colon cancer   . Family history of uterine cancer   . Furuncle 10/17/2016  . Gastroparesis   . GERD (gastroesophageal reflux disease)   . Headache   . History of breast cancer 2006   with return in 2008, s/p mastectomy, XRT and chemotherapy  . Hypertension   . 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 IBS (irritable bowel syndrome)   . ICD (implantable cardiac defibrillator) in place 11/10/2012   ICD (11/2012)  . Malignant neoplasm of lower-inner quadrant of left female breast ([place]) 06/26/2016  . Myocardial infarction ([place])   . OBESITY 07/24/2007  . OBSTRUCTIVE SLEEP APNEA 07/24/2007   with CPAP compliance  . Osteoarthritis   . Persistent atrial fibrillation ([place]) 09/2014  . Personal history of radiation therapy    current  . Presence of permanent cardiac pacemaker   . RESTLESS LEG SYNDROME 07/24/2007  . SYSTOLIC HEART FAILURE, CHRONIC 01/05/2009  a. chemo induced cardiomyopathy b. cMRI (02/2009) EF 45% c. Myoview 05/2010: EF 59%, no ischemia d. RHC (01/2012) RA 20, RV 55/13/22, PA 56/34 (44), PCWP 28, Fick CO/CI: 3.1 / 1.5, PVR 5.3 WU, PA sat 42% & 48% e. ECHO (10/2012) EF 20-25%, diff HK, MV calcified annulus f. ECHO (03/2014) EF 30-35%, sev HK, inferior/inferoseptal walls, mild MR    Past Surgical History:  Procedure Laterality Date  . ABDOMINAL HYSTERECTOMY  1991  . BI-VENTRICULAR IMPLANTABLE CARDIOVERTER DEFIBRILLATOR N/A 11/10/2012   SJM  Forify Assura single chamber ICD  . BREAST BIOPSY Left 08/2015   benign  . BREAST BIOPSY Left 06/2016   malignant  . BREAST LUMPECTOMY Left 07/24/2016   malignant  . BREAST LUMPECTOMY WITH RADIOACTIVE SEED AND SENTINEL LYMPH NODE BIOPSY Left 07/24/2016   Procedure: LEFT BREAST LUMPECTOMY WITH RADIOACTIVE SEED AND SENTINEL LYMPH NODE BIOPSY WITH BLUE DYE INJECTION;  Surgeon: Fanny Skates, MD;  Location: [facility];  Service: General;  Laterality: Left;  . CARDIAC CATHETERIZATION N/A 08/21/2015   Procedure: Right/Left Heart Cath and Coronary Angiography;  Surgeon: Jolaine Artist, MD;  Location: [facility];  Service: Cardiovascular;  Laterality: N/A;  . CARDIAC DEFIBRILLATOR PLACEMENT  11/10/2012   SJM Fortify Assura VR implanted by Dr Rayann Heman for primary prevention  . CHOLECYSTECTOMY    . COLONOSCOPY N/A 11/17/2015   Procedure: COLONOSCOPY;  Surgeon: Jerene Bears, MD;  [facility] ENDOSCOPY;  Service: Endoscopy;  Laterality: N/A;  . ENTEROSCOPY N/A 03/26/2016   Procedure: ENTEROSCOPY;  Surgeon: Manus Gunning, MD;  Location: [facility] ENDOSCOPY;  Service: Gastroenterology;  Laterality: N/A;  . ESOPHAGOGASTRODUODENOSCOPY N/A 11/16/2015   Procedure: ESOPHAGOGASTRODUODENOSCOPY (EGD);  Surgeon: Manus Gunning, MD;  Location: [facility];  Service: Gastroenterology;  Laterality: N/A;  . ESOPHAGOGASTRODUODENOSCOPY N/A 02/24/2016   Procedure: ESOPHAGOGASTRODUODENOSCOPY (EGD);  Surgeon: Milus Banister, MD;  Location: [facility];  Service: Endoscopy;  Laterality: N/A;  . ESOPHAGOGASTRODUODENOSCOPY N/A 03/26/2016   Procedure: ESOPHAGOGASTRODUODENOSCOPY (EGD);  Surgeon: Manus Gunning, MD;  Location: [facility];  Service: Gastroenterology;  Laterality: N/A;  . EYE SURGERY     cataracts  . GIVENS CAPSULE STUDY N/A 12/11/2015   Procedure: GIVENS CAPSULE STUDY;  Surgeon: Manus Gunning, MD;  Location: [facility];  Service: Gastroenterology;  Laterality: N/A;  . KNEE  CARTILAGE SURGERY    . MASTECTOMY     right  . WRIST SURGERY Bilateral      Inpatient Medications: Scheduled Meds:  Continuous Infusions:  PRN Meds:   Allergies:    Allergies  Allergen Reactions  . Bee Venom Anaphylaxis  . Aspirin Hives  . Ciprofloxacin Hives  . Codeine Nausea Only    "can take ONLY if she eats with med"  . Naproxen Hives  . Other Other (See Comments)    ANY TYPES OF METAL-CAUSES BLISTERS SKIN AND ITCHING   . Sulfonamide Derivatives Hives  . Xarelto [Rivaroxaban] Hives  . Atorvastatin Itching  . Nsaids Itching and Rash  . Tape Rash    Also allergic to metal  . Tyler Aas Flextouch [Insulin Degludec] Itching and Rash    Social History:   Social History   Social History  . Marital status: Divorced    Spouse name: N/A  . Number of children: 1  . Years of education: N/A   Occupational History  . Disabled Unemployed   Social History Main Topics  . Smoking status: Former Smoker    Packs/day: 1.50    Years: 33.00    Types:  Cigarettes    Quit date: 04/12/2001  . Smokeless tobacco: Never Used     Comment: heaviest - 2.5- 3PPD x 3 years   . Alcohol use No  . Drug use: No  . Sexual activity: Not Currently   Other Topics Concern  . Not on file   Social History Narrative   Lives with mother and brother Philis Nettle          Family History:   The patient's family history includes Breast cancer (age of onset: 27) in her cousin; Breast cancer (age of onset: 32) in her maternal grandmother; Colon cancer in her maternal aunt; Colon cancer (age of onset: 47) in her maternal aunt; Colon cancer (age of onset: 57) in her cousin; Congestive Heart Failure in her mother; Coronary artery disease in her father; Diabetes Mellitus II in her father and mother; Hypertension in her father; Lung cancer in her maternal aunt and mother; Uterine cancer in her cousin and cousin; Uterine cancer (age of onset: 61) in her cousin; Uterine cancer (age of onset: 27) in her  maternal aunt; Uterine cancer (age of onset: 70) in her maternal aunt; Uterine cancer (age of onset: 52) in her maternal aunt.  ROS:  Please see the history of present illness.  All other ROS reviewed and negative.     Physical Exam/Data:   Vitals:   12/21/16 0900 12/21/16 0930 12/21/16 1000 12/21/16 1030  BP: 107/66 105/68 (!) 92/56 96/67  Pulse: 95 94 94 (!) 115  Resp: (!) 21 17  20   Temp:      TempSrc:      SpO2: 95% 96% 93% 93%  Weight:      Height:       No intake or output data in the 24 hours ending 12/21/16 1051 Filed Weights   12/21/16 0739  Weight: 215 lb (97.5 kg)   Body mass index is 35.78 kg/m.  General:  Well nourished, well developed, in no acute distress\ HEENT: normal Lymph: no adenopathy Neck: no JVD Endocrine:  No thryomegaly Vascular: No carotid bruits; FA pulses 2+ bilaterally without bruits  Cardiac:  normal S1, S2; RRR; no murmur  Lungs:  clear to auscultation bilaterally, no wheezing, rhonchi or rales  Abd: soft, nontender, no hepatomegaly  Ext: Trace edema Musculoskeletal:  No deformities, BUE and BLE strength normal and equal Skin: warm and dry  Neuro:  CNs 2-12 intact, no focal abnormalities noted Psych:  Normal affect    Relevant CV Studies:  Echo 12/09/16 Study Conclusions  - Left ventricle: The cavity size was mildly dilated. Wall   thickness was normal. Systolic function was severely reduced. The   estimated ejection fraction was 15%. Diffuse hypokinesis. - Mitral valve: Calcified annulus. There was mild regurgitation. - Pulmonary arteries: PA peak pressure: 40 mm Hg (S).  Impressions:  - Severe, global reduction in LV systolic function; mild diastolic   dysfunction; mild LVE; mild MR and TR.   RHC/LCH  08/20/2014   Mid Cx lesion, 30% stenosed.  3rd Mrg lesion, 30% stenosed. Findings: RA = 12 RV = 55/6/12 PA = 54/21 (34) PCW = 22 Ao 93/58 (71) LV 106/8/26 Fick cardiac output/index = 4.2/2.1 PVR = 2.8 SVR =  1115 FA sat = 93% PA sat = 54%, 53% Assessment: 1. Very mild CAD 2. Mild to moderately increased filling pressures 3. Severe LV dysfunction due to NICM EF 20%medications.   [place] on 01/30/12 showed decompensated HF with low cardiac output consistent with cardiogenic shock.  [place]  6/27  RA = 20  RV = 55/13/22  PA =56/34 (44)  PCW = 28  Fick cardiac output/index = 3.1/1.5  PVR = 5.3 [place]  FA sat = 91%  PA sat = 42%, 48%  SVC sat = 46%  Laboratory Data:  Chemistry Recent Labs Lab 12/21/16 0839  NA 136  K 3.8  CL 100*  CO2 25  GLUCOSE 192*  BUN 12  CREATININE 0.96  CALCIUM 9.4  GFRNONAA >60  GFRAA >60  ANIONGAP 11    No results for input(s): PROT, ALBUMIN, AST, ALT, ALKPHOS, BILITOT in the last 168 hours. Hematology Recent Labs Lab 12/21/16 0839  WBC 8.5  RBC 4.95  HGB 14.5  HCT 44.9  MCV 90.7  MCH 29.3  MCHC 32.3  RDW 16.7*  PLT 206   Cardiac EnzymesNo results for input(s): TROPONINI in the last 168 hours.  Recent Labs Lab 12/21/16 0831  TROPIPOC 0.00    BNPNo results for input(s): BNP, PROBNP in the last 168 hours.  DDimer No results for input(s): DDIMER in the last 168 hours.  Radiology/Studies:  Dg Chest 2 View  Result Date: 12/21/2016 CLINICAL DATA:  Shortness of breath for 3 months which worsened today. Mid to left chest pain. EXAM: CHEST  2 VIEW COMPARISON:  07/23/2016. FINDINGS: Trachea is midline. Heart is enlarged. Thoracic aorta is calcified. AICD lead is in the right ventricle. Biapical pleural thickening. Mild prominence of the interstitial markings, likely chronic. No pleural fluid. IMPRESSION: No acute findings. Electronically Signed   By: Lorin Picket M.D.   On: 12/21/2016 08:19    Assessment and Plan:   1. Chronic systolic CHF/ NICM due to chemo s/p St. Jude ICD - last echo 12/09/16 showed further reduction in EF to 15% with diffuse hypokinesis. Pending CPX schedule Monday.  - She has worsening dyspnea with orthopnea however not  volume overloaded on exam. CXR without vascular congestion. Pending BNP. Less likely PE given compliance with anticoagulation. She did felt better with one dose of metolazone prior to appointment with Dr. Haroldine Laws. Will given IV lasix 80mg  x 1. If symptoms better, outpatient follow up.   2. PAF - EKG with sinus vs ectopic rhythm. Telemetry showed intermittent afib at rate of 120s. ? Causing her to have dyspnea.  CHADSVASc score of 4 (sex, CHF, HTN and DM). Continue Eliquis for anticoagulation.   3. OSA - She will get new CPAP next week.  4. Chest pain with hx of mild nonobstructive CAD on 2017 cath.   - Episode of chest pain this morning resolved with SL nitro x 1. EKG without acute ischemic changes. POC troponin negative.   5. Breast Cancer - s/p lumpectomy and Radiation  6. HTN - Soft low BP here.   Jarrett Soho, PA  12/21/2016 10:51 AM   Personally seen and examined. Agree with above.  Reviewed prior office note from Dr. Haroldine Laws. EF is 15%, diffuse nonischemic cardiomyopathy with cardiac catheterization and 2017 showing no significant obstructive coronary artery disease. When I discussed with her her symptoms, her main complaint is shortness of breath. She really did not elaborate on chest discomfort unless elicited. She is now having no chest discomfort. Troponin was normal. When asked how long she has had shortness of breath, she says many weeks but over the last several days she thinks that it may have been worsening and she is somewhat scared by this because she lives alone. She was feeling some nausea as well as.  On exam, heart  is irregularly irregular, sometimes tachycardic, occasionally she will have what appears to be sinus rhythm with first-degree AV block. Thick neck, difficult to assess JVD, lungs with no significant crackles at bases.  Plan will be to treat this as potential exacerbation of acute on chronic systolic heart failure and give her Lasix 80 mg IV  1 in the emergency department. Potentially, if she is feeling better after this administration, she could be discharged and allowed to follow-up on Monday for her cardiopulmonary stress test.  Continuing with amiodarone for her atrial fibrillation episodes. Anxious.  If she does not feel better after demonstration of Lasix, we can always bring her into the hospital for further therapy under the guidance of advanced heart failure team.  I discussed with Dr. Cathleen Fears.  Candee Furbish, MD

## 2016-12-21 NOTE — ED Provider Notes (Addendum)
[facility] DEPT Provider Note   CSN: [id number] Arrival date & time: 12/21/16  0730     History   Chief Complaint Chief Complaint  Patient presents with  . Shortness of Breath  . Chest Pain    HPI [name] is a 61 y.o. female.  HPI complains of chest pain onset 6 AM this morning radiating to left neck. Onset at rest. Treated at home with one sublingual nitroglycerin with complete relief. Presently asymptomatic. Other associated symptoms include shortness of breath for the past 3 months which became slightly worse this morning. Shortness of breath is worse with lying supine and improved with sitting upright Presently feels at baseline. No other associated symptoms.   Past Medical History:  Diagnosis Date  . AICD (automatic cardioverter/defibrillator) present    st jude  . Anxiety   . 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 Asthma   . Axillary mass, left 10/17/2016  . Breast cancer ([place]) 2005, 2017  . CHF (congestive heart failure) ([place])   . COPD (chronic obstructive pulmonary disease) ([place])   . Depression    followed by a psychiatrist  . DYSLIPIDEMIA 05/01/2009  . Dysrhythmia    atrial fibrillation  . Family history of breast cancer   . Family history of colon cancer   . Family history of uterine cancer   . Furuncle 10/17/2016  . Gastroparesis   . GERD (gastroesophageal reflux disease)   . Headache   . History of breast cancer 2006   with return in 2008, s/p mastectomy, XRT and chemotherapy  . Hypertension   . 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 IBS (irritable bowel syndrome)   . ICD (implantable cardiac defibrillator) in place 11/10/2012   ICD (11/2012)  . Malignant neoplasm of lower-inner quadrant of left female breast ([place]) 06/26/2016  . Myocardial infarction ([place])   . OBESITY 07/24/2007  . OBSTRUCTIVE SLEEP APNEA 07/24/2007   with CPAP compliance  . Osteoarthritis   . Persistent atrial fibrillation ([place]) 09/2014  . Personal history of radiation therapy    current  .  Presence of permanent cardiac pacemaker   . RESTLESS LEG SYNDROME 07/24/2007  . SYSTOLIC HEART FAILURE, CHRONIC 01/05/2009   a. chemo induced cardiomyopathy b. cMRI (02/2009) EF 45% c. Myoview 05/2010: EF 59%, no ischemia d. RHC (01/2012) RA 20, RV 55/13/22, PA 56/34 (44), PCWP 28, Fick CO/CI: 3.1 / 1.5, PVR 5.3 WU, PA sat 42% & 48% e. ECHO (10/2012) EF 20-25%, diff HK, MV calcified annulus f. ECHO (03/2014) EF 30-35%, sev HK, inferior/inferoseptal walls, mild MR    Patient Active Problem List   Diagnosis Date Noted  . Axillary mass, left 10/17/2016  . Boil 10/17/2016  . Genetic testing 08/15/2016  . Family history of breast cancer   . Family history of uterine cancer   . Family history of colon cancer   . Malignant neoplasm of lower-inner quadrant of left female breast ([place]) 06/26/2016  . Small bowel polyp   . CHF (congestive heart failure) ([place]) 12/21/2015  . Acute blood loss anemia   . Atrial fibrillation ([place]) 12/11/2015  . Benign neoplasm of descending colon   . Upper GI bleeding   . Iron deficiency anemia due to chronic blood loss   . Acute upper GI bleeding 11/14/2015  . Anemia 11/14/2015  . Absolute anemia   . Symptomatic anemia   . Bleeding gastrointestinal   . PAF (paroxysmal atrial fibrillation) ([place]) 09/22/2015  . Chest pain at rest 08/19/2015  . Chest pain 08/19/2015  . NSVT (nonsustained  ventricular tachycardia) ([place]) 06/30/2015  . Breast cancer of lower-outer quadrant of right female breast ([place]) 07/22/2014  . CAD (coronary artery disease) 02/17/2014  . 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 PALPITATIONS 09/20/2010  . Mixed hypercholesterolemia and hypertriglyceridemia 05/01/2009  . HYPERTENSION, BENIGN 01/05/2009  . FOOT PAIN 11/29/2008  . Sinus tachycardia 10/25/2008  . 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 Obesity 07/24/2007  . Obstructive sleep apnea 07/24/2007  . RESTLESS LEG SYNDROME 07/24/2007     Past Surgical History:  Procedure Laterality Date  . ABDOMINAL HYSTERECTOMY  1991  . BI-VENTRICULAR IMPLANTABLE CARDIOVERTER DEFIBRILLATOR N/A 11/10/2012   SJM Forify Assura single chamber ICD  . BREAST BIOPSY Left 08/2015   benign  . BREAST BIOPSY Left 06/2016   malignant  . BREAST LUMPECTOMY Left 07/24/2016   malignant  . BREAST LUMPECTOMY WITH RADIOACTIVE SEED AND SENTINEL LYMPH NODE BIOPSY Left 07/24/2016   Procedure: LEFT BREAST LUMPECTOMY WITH RADIOACTIVE SEED AND SENTINEL LYMPH NODE BIOPSY WITH BLUE DYE INJECTION;  Surgeon: Fanny Skates, MD;  Location: [facility];  Service: General;  Laterality: Left;  . CARDIAC CATHETERIZATION N/A 08/21/2015   Procedure: Right/Left Heart Cath and Coronary Angiography;  Surgeon: Jolaine Artist, MD;  Location: [facility];  Service: Cardiovascular;  Laterality: N/A;  . CARDIAC DEFIBRILLATOR PLACEMENT  11/10/2012   SJM Fortify Assura VR implanted by Dr Rayann Heman for primary prevention  . CHOLECYSTECTOMY    . COLONOSCOPY N/A 11/17/2015   Procedure: COLONOSCOPY;  Surgeon: Jerene Bears, MD;  Location: [facility];  Service: Endoscopy;  Laterality: N/A;  . ENTEROSCOPY N/A 03/26/2016   Procedure: ENTEROSCOPY;  Surgeon: Manus Gunning, MD;  Location: [facility];  Service: Gastroenterology;  Laterality: N/A;  . ESOPHAGOGASTRODUODENOSCOPY N/A 11/16/2015   Procedure: ESOPHAGOGASTRODUODENOSCOPY (EGD);  Surgeon: Manus Gunning, MD;  Location: [facility];  Service: Gastroenterology;  Laterality: N/A;  . ESOPHAGOGASTRODUODENOSCOPY N/A 02/24/2016   Procedure: ESOPHAGOGASTRODUODENOSCOPY (EGD);  Surgeon: Milus Banister, MD;  Location: [facility];  Service: Endoscopy;  Laterality: N/A;  . ESOPHAGOGASTRODUODENOSCOPY N/A 03/26/2016   Procedure: ESOPHAGOGASTRODUODENOSCOPY (EGD);  Surgeon: Manus Gunning, MD;  Location: [facility];  Service: Gastroenterology;  Laterality: N/A;  . EYE SURGERY     cataracts  . GIVENS CAPSULE STUDY N/A  12/11/2015   Procedure: GIVENS CAPSULE STUDY;  Surgeon: Manus Gunning, MD;  Location: [facility];  Service: Gastroenterology;  Laterality: N/A;  . KNEE CARTILAGE SURGERY    . MASTECTOMY     right  . WRIST SURGERY Bilateral     OB History    No data available       Home Medications    Prior to Admission medications   Medication Sig Start Date End Date Taking? Authorizing Provider  acetaminophen (TYLENOL) 650 MG CR tablet Take 1,300 mg by mouth every 8 (eight) hours as needed for pain.    [provider]  albuterol (PROVENTIL HFA) 108 (90 BASE) MCG/ACT inhaler Inhale 2 puffs into the lungs every 6 (six) hours as needed. For shortness of breath    [provider]  ALPRAZolam (XANAX) 0.5 MG tablet Take 0.25 mg by mouth 4 (four) times daily as needed for anxiety.  11/06/15   [provider]  amiodarone (PACERONE) 200 MG tablet Take 1 tablet (200 mg total) by mouth daily. 07/11/16   Bensimhon, Shaune Pascal, MD  apixaban (ELIQUIS) 5 MG TABS tablet Take 1 tablet (5 mg total) by mouth 2 (two) times daily. 07/11/16  Bensimhon, Shaune Pascal, MD  baclofen (LIORESAL) 20 MG tablet Take 20 mg by mouth 2 (two) times daily as needed for muscle spasms.    [provider]  busPIRone (BUSPAR) 30 MG tablet Take 30 mg by mouth 2 (two) times daily.     [provider]  Calcium Carb-Cholecalciferol (CALCIUM 600+D3) 600-800 MG-UNIT TABS Take by mouth daily.    [provider]  carvedilol (COREG) 6.25 MG tablet Take 1 tablet (6.25 mg total) by mouth 2 (two) times daily with a meal. 07/11/16   Bensimhon, Shaune Pascal, MD  digoxin (LANOXIN) 0.125 MG tablet Take 1 tablet (0.125 mg total) by mouth every other day. 07/11/16   Bensimhon, Shaune Pascal, MD  docusate sodium (COLACE) 100 MG capsule Take 3 capsules (300 mg total) by mouth at bedtime. 10/03/15   Clegg, Amy D, NP  doxycycline (VIBRA-TABS) 100 MG tablet Take 1 tablet (100 mg total) by mouth 2 (two) times daily. 10/17/16    Truman Hayward, MD  DULoxetine (CYMBALTA) 30 MG capsule Take 30 mg by mouth daily.    Moon, Amy A, NP  Ferrous Fumarate 63 (20 Fe) MG TABS Take 1 tablet by mouth 2 (two) times daily.    [provider]  furosemide (LASIX) 20 MG tablet TAKE 3 TABLETS BY MOUTH 2  TIMES DAILY. IF 1 TO 2  POUND WEIGHT GAIN IN A DAY, TAKE EXTRA 20 MG DOSE 08/23/16   Larey Dresser, MD  Garlic 161 MG CAPS Take 500 mg by mouth daily after lunch.     [provider]  HUMALOG KWIKPEN 100 UNIT/ML KiwkPen Inject 14-24 Units into the skin 3 (three) times daily. On average per patient 24 unit with breakfast, 16 units with lunch, & 14-16 units with supper 10/30/15   [provider]  HUMALOG MIX 75/25 KWIKPEN (75-25) 100 UNIT/ML Kwikpen Inject 75 Units into the skin 2 (two) times daily. 11/21/15   [provider]  hyaluronate sodium (RADIAPLEXRX) GEL Apply 1 application topically 2 (two) times daily.    [provider]  hydrOXYzine (ATARAX/VISTARIL) 50 MG tablet Take 50 mg by mouth every 6 (six) hours as needed for itching ((primarily taken at bedtime)).     [provider]  IRON PO Take 24 mg by mouth daily.    [provider]  ivabradine (CORLANOR) 5 MG TABS tablet Take 1 tablet (5 mg total) by mouth 2 (two) times daily with a meal. 07/11/16   Bensimhon, Shaune Pascal, MD  levothyroxine (SYNTHROID, LEVOTHROID) 75 MCG tablet Take 75 mcg by mouth daily before breakfast.     [provider]  meclizine (ANTIVERT) 12.5 MG tablet Take 1 tablet (12.5 mg total) by mouth 3 (three) times daily as needed for dizziness. 07/23/16   Law, Bea Graff, PA-C  Melatonin 1 MG TABS Take 0.5 mg by mouth at bedtime.    [provider]  metolazone (ZAROXOLYN) 2.5 MG tablet Take 1 tablet (2.5 mg total) by mouth as needed (for weight gain >3 lbs). 07/30/16   Bensimhon, Shaune Pascal, MD  mometasone (NASONEX) 50 MCG/ACT nasal spray Place 1 spray into both nostrils daily. allergies     [provider]  Multiple Minerals (CALCIUM-MAGNESIUM-ZINC) TABS Take 1 tablet by mouth daily after lunch.     [provider]  nitroGLYCERIN (NITROSTAT) 0.4 MG SL tablet Place 1 tablet (0.4 mg total) under the tongue every 5 (five) minutes as needed. For chest pain 07/02/16   Bensimhon, Shaune Pascal,  MD  non-metallic deodorant Jethro Poling) MISC Apply 1 application topically daily as needed.    [provider]  Omega-3 Fatty Acids (FISH OIL) 1000 MG CAPS Take 2 capsules by mouth daily.    [provider]  pantoprazole (PROTONIX) 40 MG tablet Take 40 mg by mouth 2 (two) times daily.      [provider]  Polyethyl Glycol-Propyl Glycol (SYSTANE OP) Apply to eye as needed.    [provider]  polyethylene glycol powder (GLYCOLAX/MIRALAX) powder Mix 17 grams in 8 oz of water twice daily 06/04/16   Armbruster, Renelda Loma, MD  potassium chloride SA (K-DUR,KLOR-CON) 20 MEQ tablet Take 4 tablets (80 mEq total) by mouth 2 (two) times daily. 12/11/16   Bensimhon, Shaune Pascal, MD  Propylene Glycol (SYSTANE BALANCE) 0.6 % SOLN Place 1 drop into both eyes 3 (three) times daily.    [provider]  pyridOXINE (VITAMIN B-6) 100 MG tablet Take 100 mg by mouth daily after lunch.     [provider]  rOPINIRole (REQUIP) 1 MG tablet Take 1 mg by mouth at bedtime and may repeat dose one time if needed. IF RESTLESS LEGS PERSIST PATIENT MAY REPEAT ADDITIONAL DOSE    [provider]  rosuvastatin (CRESTOR) 10 MG tablet Take 10 mg by mouth at bedtime. 06/18/16   [provider]  sacubitril-valsartan (ENTRESTO) 49-51 MG Take 1 tablet by mouth 2 (two) times daily. 12/09/16   Bensimhon, Shaune Pascal, MD  Simethicone Extra Strength 125 MG CAPS Take 1 tablet by mouth daily as needed (for bloating). Reported on 02/23/2016    [provider]  spironolactone (ALDACTONE) 25 MG tablet Take 1 tablet (25 mg total) by mouth daily. 07/11/16   Bensimhon, Shaune Pascal, MD  SUMAtriptan (IMITREX) 100 MG tablet Take 100 mg by mouth daily as needed for migraine.  11/28/15   [provider]  tiotropium (SPIRIVA) 18 MCG inhalation capsule Place 18 mcg into inhaler and inhale daily.      [provider]  traMADol (ULTRAM) 50 MG tablet Take 1 tablet by mouth as needed. 10/15/16   [provider]  vitamin B-12 (CYANOCOBALAMIN) 1000 MCG tablet Take 1,000 mcg by mouth daily after lunch.     [provider]  vitamin C (ASCORBIC ACID) 500 MG tablet Take 500 mg by mouth daily after lunch.     [provider]  vitamin E 400 UNIT capsule Take 400 Units by mouth daily after lunch.     [provider]    Family History Family History  Problem Relation Age of Onset  . Diabetes Mellitus II Mother   . Lung cancer Mother   . Congestive Heart Failure Mother   . Coronary artery disease Father        CABG x 3  . Hypertension Father   . Diabetes Mellitus II Father   . Breast cancer Cousin 58       maternal first cousin  . Colon cancer Cousin 27       maternal first cousin  . Uterine cancer Cousin        dx in her 63s  . Colon cancer Maternal Aunt        dx in her late 35s  . Uterine cancer Maternal Aunt 66  . Lung cancer Maternal Aunt   . Uterine cancer Maternal Aunt 38  . Colon cancer Maternal Aunt 54  . Breast cancer Maternal Grandmother 45  . Uterine cancer Maternal Aunt 48  .  Uterine cancer Cousin        dx in her 55s  . Uterine cancer Cousin 17    Social History Social History  Substance Use Topics  . Smoking status: Former Smoker    Packs/day: 1.50    Years: 33.00    Types: Cigarettes    Quit date: 04/12/2001  . Smokeless tobacco: Never Used     Comment: heaviest - 2.5- 3PPD x 3 years   . Alcohol use No     Allergies   Bee venom; Aspirin; Ciprofloxacin; Codeine; Naproxen; Other; Sulfonamide derivatives; Xarelto [rivaroxaban]; Atorvastatin; Nsaids; Tape; and Tresiba flextouch [insulin  degludec]   Review of Systems Review of Systems  Constitutional: Negative.   HENT: Negative.   Respiratory: Positive for shortness of breath.        Chronic dyspnea. Patient reportedly uses CPAP and her CPAP she has been broken for the past 3 months she reports that CPAP machine is to be delivered to her home on 12/27/2016  Cardiovascular: Positive for chest pain.  Gastrointestinal: Negative.   Musculoskeletal: Negative.   Skin: Negative.   Neurological: Negative.   Psychiatric/Behavioral: Negative.   All other systems reviewed and are negative.    Physical Exam Updated Vital Signs BP 107/62 (BP Location: Left Arm)   Pulse 95   Temp 98 F (36.7 C) (Oral)   Resp 20   Ht 5\' 5"  (1.651 m)   Wt 215 lb (97.5 kg)   SpO2 98%   BMI 35.78 kg/m   Physical Exam  Constitutional: She appears well-developed and well-nourished.  Chronically ill-appearing  HENT:  Head: Normocephalic and atraumatic.  Eyes: Conjunctivae are normal. Pupils are equal, round, and reactive to light.  Neck: Neck supple. JVD present. No tracheal deviation present. No thyromegaly present.  Cardiovascular: Normal rate and regular rhythm.   No murmur heard. Pulmonary/Chest: Effort normal and breath sounds normal.  Abdominal: Soft. Bowel sounds are normal. She exhibits no distension. There is no tenderness.  Obese  Musculoskeletal: Normal range of motion. She exhibits edema. She exhibits no tenderness.  1+ pretibial pitting edema bilaterally  Neurological: She is alert. Coordination normal.  Skin: Skin is warm and dry. No rash noted.  Psychiatric: She has a normal mood and affect.  Nursing note and vitals reviewed.    ED Treatments / Results  Labs (all labs ordered are listed, but only abnormal results are displayed) Labs Reviewed  [facility], ED    EKG  EKG Interpretation  Date/Time:  Saturday Dec 21 2016 07:44:14 EDT Ventricular  Rate:  97 PR Interval:    QRS Duration: 147 QT Interval:  393 QTC Calculation: 500 R Axis:   -121 Text Interpretation:  Sinus or ectopic atrial rhythm Right bundle branch block Anterior infarct, old No significant change since last tracing Confirmed by Winfred Leeds  MD, Mariano Doshi [phone]) on 12/21/2016 8:05:50 AM      Results for orders placed or performed during the hospital encounter of 38/46/65  Basic metabolic panel  Result Value Ref Range   Sodium 136 135 - 145 mmol/L   Potassium 3.8 3.5 - 5.1 mmol/L   Chloride 100 (L) 101 - 111 mmol/L   CO2 25 22 - 32 mmol/L   Glucose, Bld 192 (H) 65 - 99 mg/dL   BUN 12 6 - 20 mg/dL   Creatinine, Ser 0.96 0.44 - 1.00 mg/dL   Calcium 9.4 8.9 - 10.3 mg/dL   GFR calc non  Af Amer >60 >60 mL/min   GFR calc Af Amer >60 >60 mL/min   Anion gap 11 5 - 15  CBC  Result Value Ref Range   WBC 8.5 4.0 - 10.5 K/uL   RBC 4.95 3.87 - 5.11 MIL/uL   Hemoglobin 14.5 12.0 - 15.0 g/dL   HCT 44.9 36.0 - 46.0 %   MCV 90.7 78.0 - 100.0 fL   MCH 29.3 26.0 - 34.0 pg   MCHC 32.3 30.0 - 36.0 g/dL   RDW 16.7 (H) 11.5 - 15.5 %   Platelets 206 150 - 400 K/uL  I-stat troponin, ED  Result Value Ref Range   Troponin i, poc 0.00 0.00 - 0.08 ng/mL   Comment 3           Dg Chest 2 View  Result Date: 12/21/2016 CLINICAL DATA:  Shortness of breath for 3 months which worsened today. Mid to left chest pain. EXAM: CHEST  2 VIEW COMPARISON:  07/23/2016. FINDINGS: Trachea is midline. Heart is enlarged. Thoracic aorta is calcified. AICD lead is in the right ventricle. Biapical pleural thickening. Mild prominence of the interstitial markings, likely chronic. No pleural fluid. IMPRESSION: No acute findings. Electronically Signed   By: Lorin Picket M.D.   On: 12/21/2016 08:19    Radiology Dg Chest 2 View  Result Date: 12/21/2016 CLINICAL DATA:  Shortness of breath for 3 months which worsened today. Mid to left chest pain. EXAM: CHEST  2 VIEW COMPARISON:  07/23/2016. FINDINGS: Trachea  is midline. Heart is enlarged. Thoracic aorta is calcified. AICD lead is in the right ventricle. Biapical pleural thickening. Mild prominence of the interstitial markings, likely chronic. No pleural fluid. IMPRESSION: No acute findings. Electronically Signed   By: Lorin Picket M.D.   On: 12/21/2016 08:19  Chest x-ray viewed by me  Procedures Procedures (including critical care time)  Medications Ordered in ED Medications - No data to display   Initial Impression / Assessment and Plan / ED Course  I have reviewed the triage vital signs and the nursing notes.  Pertinent labs & imaging results that were available during my care of the patient were reviewed by me and considered in my medical decision making (see chart for details).   heart score equals 5  Cardiology service consulted and will come to evaluate patient in ED 10:25 AM patient pain-free. Complains of nausea, otherwise asymptomatic however  declines antiemetics  11:15 AM requesting antiemetics. IV Reglan ordered Dr.Skains UA patient in the emergency department and suggested Lasix 80 mg IV and observe the patient for a few hours. Ordered by me. If patient continued to feel dyspneic he would arrange for overnight stay. He felt that she could appear in 2 days for further evaluation of chest pain At 2 PM patient feels ready to go home she states she's diuresed and breathing is improved and she was able to use the commode without difficulty. At 2 PM heart rate counted at 88 bpm by me and respiratory rate counted at 20. Patient appears comfortable Final Clinical Impressions(s) / ED Diagnoses   #1 chest pain at rest #2 hyperglycemia #3 chronic dyspnea Final diagnoses:  None    New Prescriptions New Prescriptions   No medications on file     Orlie Dakin, MD 12/21/16 [place], MD 12/21/16 1356

## 2016-12-21 NOTE — Discharge Instructions (Signed)
Keep your scheduled appointment for your stress test in 2 days. If your condition worsens over the weekend, return to the emergency department

## 2016-12-21 NOTE — ED Notes (Signed)
ED Provider at bedside. 

## 2016-12-21 NOTE — ED Triage Notes (Signed)
Pt from home with c/o left chest tightness/pressure starting this morning radiating with sharp pain into her left neck.  Pt reports pain was 8/10 and took 1 nitro with relief to 0/10.  Pt states she felt mildly light headed when the pain was present.  Initially pt called EMS for SOB which became worse this morning; worsening for the last 3 months since her CPAP broke.  Pt saw cardiologist almost 2 weeks ago and was told her EF is now 15% and is scheduled for a stress test on Monday.  Pt was not given Asprin due to allergy. NAD, A&O.

## 2016-12-21 NOTE — ED Notes (Signed)
Assisted pt using bedside commode. No difficulties.

## 2016-12-23 ENCOUNTER — Ambulatory Visit (HOSPITAL_COMMUNITY)
Admission: RE | Admit: 2016-12-23 | Discharge: 2016-12-23 | Disposition: A | Discharge status: Home / Self Care | Payer: Medicare Other | Source: Ambulatory Visit | Attending: Internal Medicine | Admitting: Internal Medicine

## 2016-12-23 ENCOUNTER — Ambulatory Visit (HOSPITAL_COMMUNITY): Payer: Medicare Other

## 2016-12-23 ENCOUNTER — Encounter: Payer: Self-pay | Admitting: Oncology

## 2016-12-23 ENCOUNTER — Other Ambulatory Visit (HOSPITAL_COMMUNITY): Payer: Self-pay | Admitting: *Deleted

## 2016-12-23 DIAGNOSIS — R9439 Abnormal result of other cardiovascular function study: Secondary | ICD-10-CM | POA: Diagnosis not present

## 2016-12-23 DIAGNOSIS — I5022 Chronic systolic (congestive) heart failure: Secondary | ICD-10-CM

## 2016-12-23 LAB — BASIC METABOLIC PANEL
ANION GAP: 10 (ref 5–15)
BUN: 14 mg/dL (ref 6–20)
CHLORIDE: 97 mmol/L — AB (ref 101–111)
CO2: 28 mmol/L (ref 22–32)
Calcium: 9.3 mg/dL (ref 8.9–10.3)
Creatinine, Ser: 1.07 mg/dL — ABNORMAL HIGH (ref 0.44–1.00)
GFR calc non Af Amer: 55 mL/min — ABNORMAL LOW (ref >60)
Glucose, Bld: 263 mg/dL — ABNORMAL HIGH (ref 65–99)
POTASSIUM: 4 mmol/L (ref 3.5–5.1)
Sodium: 135 mmol/L (ref 135–145)

## 2016-12-25 ENCOUNTER — Encounter: Payer: Medicare Other | Admitting: *Deleted

## 2016-12-25 ENCOUNTER — Other Ambulatory Visit: Payer: Self-pay | Admitting: Pharmacist

## 2016-12-25 ENCOUNTER — Telehealth: Payer: Self-pay | Admitting: Pulmonary Disease

## 2016-12-25 ENCOUNTER — Ambulatory Visit: Payer: Self-pay | Admitting: Pharmacist

## 2016-12-25 ENCOUNTER — Telehealth: Payer: Self-pay | Admitting: Physician Assistant

## 2016-12-25 DIAGNOSIS — J449 Chronic obstructive pulmonary disease, unspecified: Secondary | ICD-10-CM | POA: Diagnosis not present

## 2016-12-25 DIAGNOSIS — E1165 Type 2 diabetes mellitus with hyperglycemia: Secondary | ICD-10-CM | POA: Diagnosis not present

## 2016-12-25 DIAGNOSIS — R110 Nausea: Secondary | ICD-10-CM | POA: Diagnosis not present

## 2016-12-25 DIAGNOSIS — E1140 Type 2 diabetes mellitus with diabetic neuropathy, unspecified: Secondary | ICD-10-CM | POA: Diagnosis not present

## 2016-12-25 DIAGNOSIS — E785 Hyperlipidemia, unspecified: Secondary | ICD-10-CM | POA: Diagnosis not present

## 2016-12-25 DIAGNOSIS — E039 Hypothyroidism, unspecified: Secondary | ICD-10-CM | POA: Diagnosis not present

## 2016-12-25 NOTE — Patient Outreach (Signed)
[name] was referred to pharmacy for medication review. Left a HIPAA compliant message. If have not heard from patient by 12/27/16, will give her another call at that time.  Harlow Asa, PharmD, [facility] [phone]

## 2016-12-25 NOTE — Telephone Encounter (Signed)
Paged by answering service, patient having dizziness for past 3 hours. No chest pain. Stable dyspnea. Patient is riding in car (daughter is driving). She will check BP when goes home. Advised to hold hypertensive medication  If BP below 100/50. No syncope. She will call us if concern.

## 2016-12-25 NOTE — Telephone Encounter (Signed)
Spoke with pt, who states she is return a call from our office. There is no documentation of pt being called since 12/20/16.  Nothing further needed.

## 2016-12-26 ENCOUNTER — Telehealth: Payer: Self-pay | Admitting: Oncology

## 2016-12-26 ENCOUNTER — Ambulatory Visit: Admission: RE | Admit: 2016-12-26 | Payer: Medicare Other | Source: Ambulatory Visit | Admitting: Radiation Oncology

## 2016-12-26 NOTE — Telephone Encounter (Signed)
Called patient about her follow up appointment today.  She said she forgot about the appointment.  Transferred her to [facility], [place], Art therapist to reschedule.

## 2016-12-27 ENCOUNTER — Encounter: Payer: Self-pay | Admitting: Cardiology

## 2016-12-27 ENCOUNTER — Other Ambulatory Visit: Payer: Self-pay | Admitting: Pharmacist

## 2016-12-27 ENCOUNTER — Ambulatory Visit: Payer: Self-pay | Admitting: Pharmacist

## 2016-12-27 NOTE — Patient Outreach (Signed)
[name] was referred to pharmacy for medication review. Outreach attempt #2. Left a HIPAA compliant message on her voicemail. If have not heard from patient by next week, will give her another call at that time.  Harlow Asa, PharmD, [facility] [phone]

## 2016-12-31 ENCOUNTER — Other Ambulatory Visit: Payer: Self-pay | Admitting: Internal Medicine

## 2017-01-01 ENCOUNTER — Ambulatory Visit: Payer: Self-pay | Admitting: Pharmacist

## 2017-01-01 ENCOUNTER — Other Ambulatory Visit: Payer: Self-pay | Admitting: Pharmacist

## 2017-01-01 NOTE — Patient Outreach (Signed)
[name] was referred to pharmacy for medication review. Outreach attempt #3. Left a HIPAA compliant message on her voicemail. At this time will send patient outreach letter to attempt contact. If I have not heard back from [name] within 14 days, will close pharmacy episode.  Harlow Asa, PharmD, [facility] [phone]

## 2017-01-02 ENCOUNTER — Telehealth (HOSPITAL_COMMUNITY): Payer: Self-pay

## 2017-01-02 NOTE — Telephone Encounter (Signed)
Patient calling CHF clinic triage line to report dizziness and low BP x 1 wk. BP readings 80s/40s, unable to register HR on her home machine but denies flutter/palpitations. Has lost 10-15 lbs in past month, denies any recent changes in medication and states she has taken all meds as prescribed. Will forward to Dr. Haroldine Laws to advise. Next apt: 6/18  Joanne Pain, RN

## 2017-01-02 NOTE — Telephone Encounter (Signed)
Per Dr. Haroldine Laws VO, advised patient to hold lasix through Saturday, then resume on Sunday at reduced daily dose of 60 mg once daily until seen in CHF clinic on the 18th. Patient aware and agreeable and advised to call clinic back if symptoms do not improve or worsen.  Renee Pain, RN

## 2017-01-07 ENCOUNTER — Telehealth: Payer: Self-pay | Admitting: Cardiology

## 2017-01-07 NOTE — Telephone Encounter (Signed)
Spoke w/ pt and requested that she send a manual transmission b/c her home monitor has not updated in at least 7 days. Informed pt to unplug the land line phone and just use the cell adapter to send remote transmission. Pt verbalized understanding.

## 2017-01-09 ENCOUNTER — Ambulatory Visit
Admission: RE | Admit: 2017-01-09 | Discharge: 2017-01-09 | Disposition: A | Discharge status: Home / Self Care | Payer: Medicare Other | Source: Ambulatory Visit | Attending: Radiation Oncology | Admitting: Radiation Oncology

## 2017-01-09 ENCOUNTER — Encounter: Payer: Self-pay | Admitting: Radiation Oncology

## 2017-01-09 VITALS — BP 113/95 | HR 103 | Temp 98.0°F | Ht 65.0 in | Wt 214.4 lb

## 2017-01-09 DIAGNOSIS — C50312 Malignant neoplasm of lower-inner quadrant of left female breast: Secondary | ICD-10-CM | POA: Insufficient documentation

## 2017-01-09 DIAGNOSIS — R5383 Other fatigue: Secondary | ICD-10-CM | POA: Diagnosis not present

## 2017-01-09 DIAGNOSIS — Z79899 Other long term (current) drug therapy: Secondary | ICD-10-CM | POA: Insufficient documentation

## 2017-01-09 DIAGNOSIS — R195 Other fecal abnormalities: Secondary | ICD-10-CM | POA: Insufficient documentation

## 2017-01-09 DIAGNOSIS — Z171 Estrogen receptor negative status [ER-]: Secondary | ICD-10-CM | POA: Insufficient documentation

## 2017-01-09 DIAGNOSIS — N644 Mastodynia: Secondary | ICD-10-CM | POA: Diagnosis not present

## 2017-01-09 DIAGNOSIS — Z7901 Long term (current) use of anticoagulants: Secondary | ICD-10-CM | POA: Diagnosis not present

## 2017-01-09 DIAGNOSIS — C50511 Malignant neoplasm of lower-outer quadrant of right female breast: Secondary | ICD-10-CM

## 2017-01-09 NOTE — Progress Notes (Signed)
Radiation Oncology         [phone] ________________________________  Name: [name] MRN: [id number]  Date: 01/09/2017  DOB: 02-23-1956  Follow-Up Visit Note  CC: Lowella Dandy, NP  Fanny Skates, MD    ICD-10-CM   1. 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 Patient reports occasional 8/10 sharp pains to her left breast that started 3 weeks ago. She reports this pain often occurs at night. She reports difficulty breathing laying flat and she has to elevate her head to sleep. She reports feeling fatigued. She reports blood in the stool since Monday. She is taking Eliquis. Patient reports she presented to the ED on 12/21/16 for chest pain. She reports her EF is now 15%.                            ALLERGIES:  is allergic to bee venom; aspirin; ciprofloxacin; codeine; naproxen; other; sulfonamide derivatives; xarelto [rivaroxaban]; atorvastatin; nsaids; tape; and tresiba flextouch [insulin degludec].  Meds: Current Outpatient Prescriptions  Medication Sig Dispense Refill  . acetaminophen (TYLENOL) 650 MG CR tablet Take 1,300 mg by mouth every 8 (eight) hours as needed for pain.    Marland Kitchen albuterol (PROVENTIL HFA) 108 (90 BASE) MCG/ACT inhaler Inhale 2 puffs into the lungs every 6 (six) hours as needed. For shortness of breath    . amiodarone (PACERONE) 200 MG tablet Take 1 tablet (200 mg total) by mouth daily. 90 tablet 3  . apixaban (ELIQUIS) 5 MG TABS tablet Take 1 tablet (5 mg total) by mouth 2 (two) times daily. 180 tablet 3  . busPIRone (BUSPAR) 30 MG tablet Take 30 mg by mouth 2 (two) times daily.     . Calcium Carb-Cholecalciferol  (CALCIUM 600+D3) 600-800 MG-UNIT TABS Take by mouth daily.    . carvedilol (COREG) 6.25 MG tablet Take 1 tablet (6.25 mg total) by mouth 2 (two) times daily with a meal. 180 tablet 3  . digoxin (LANOXIN) 0.125 MG tablet Take 1 tablet (0.125 mg total) by mouth every other day. 45 tablet 3  . docusate sodium (COLACE) 100 MG capsule Take 3 capsules (300 mg total) by mouth at bedtime. 10 capsule 0  . DULoxetine (CYMBALTA) 30 MG capsule Take 30 mg by mouth daily.    . furosemide (LASIX) 20 MG tablet TAKE 3 TABLETS BY MOUTH 2  TIMES DAILY. IF 1 TO 2  POUND WEIGHT GAIN IN A DAY, TAKE EXTRA 20 MG DOSE 540 tablet 3  . Garlic 947 MG CAPS Take 500 mg by mouth daily after lunch.     Marland Kitchen HUMALOG KWIKPEN 100 UNIT/ML KiwkPen Inject 14-24 Units into the skin 3 (three) times daily. On average per patient 24 unit with breakfast, 16 units with lunch, & 14-16 units with supper    . HUMALOG MIX 75/25 KWIKPEN (75-25) 100 UNIT/ML Kwikpen Inject 75 Units into the skin 2 (two) times daily.    . ivabradine (CORLANOR) 5 MG TABS tablet Take 1 tablet (5 mg total) by mouth 2 (two) times daily with a meal. 180 tablet 3  . levothyroxine (SYNTHROID, LEVOTHROID) 75  MCG tablet Take 75 mcg by mouth daily before breakfast.     . meclizine (ANTIVERT) 12.5 MG tablet Take 1 tablet (12.5 mg total) by mouth 3 (three) times daily as needed for dizziness. 20 tablet 0  . Melatonin 1 MG TABS Take 0.5 mg by mouth at bedtime.    . mometasone (NASONEX) 50 MCG/ACT nasal spray Place 1 spray into both nostrils daily. allergies    . Multiple Minerals (CALCIUM-MAGNESIUM-ZINC) TABS Take 1 tablet by mouth daily after lunch.     . nitroGLYCERIN (NITROSTAT) 0.4 MG SL tablet Place 1 tablet (0.4 mg total) under the tongue every 5 (five) minutes as needed. For chest pain 25 tablet 3  . Omega-3 Fatty Acids (FISH OIL) 1000 MG CAPS Take 2 capsules by mouth daily.    . pantoprazole (PROTONIX) 40 MG tablet Take 40 mg by mouth 2 (two) times daily.      Vladimir Faster  Glycol-Propyl Glycol (SYSTANE OP) Apply to eye as needed.    . polyethylene glycol powder (GLYCOLAX/MIRALAX) powder Mix 17 grams in 8 oz of water twice daily 850 g 3  . potassium chloride SA (K-DUR,KLOR-CON) 20 MEQ tablet Take 4 tablets (80 mEq total) by mouth 2 (two) times daily. 720 tablet 3  . Propylene Glycol (SYSTANE BALANCE) 0.6 % SOLN Place 1 drop into both eyes 3 (three) times daily.    Marland Kitchen pyridOXINE (VITAMIN B-6) 100 MG tablet Take 100 mg by mouth daily after lunch.     Marland Kitchen rOPINIRole (REQUIP) 1 MG tablet Take 1 mg by mouth at bedtime and may repeat dose one time if needed. IF RESTLESS LEGS PERSIST PATIENT MAY REPEAT ADDITIONAL DOSE    . rosuvastatin (CRESTOR) 10 MG tablet Take 10 mg by mouth at bedtime.    . sacubitril-valsartan (ENTRESTO) 49-51 MG Take 1 tablet by mouth 2 (two) times daily. 60 tablet 3  . Simethicone Extra Strength 125 MG CAPS Take 1 tablet by mouth daily as needed (for bloating). Reported on 02/23/2016    . spironolactone (ALDACTONE) 25 MG tablet Take 1 tablet (25 mg total) by mouth daily. 90 tablet 3  . tiotropium (SPIRIVA) 18 MCG inhalation capsule Place 18 mcg into inhaler and inhale daily.      . traMADol (ULTRAM) 50 MG tablet Take 1 tablet by mouth as needed.    . vitamin B-12 (CYANOCOBALAMIN) 1000 MCG tablet Take 1,000 mcg by mouth daily after lunch.     . vitamin C (ASCORBIC ACID) 500 MG tablet Take 500 mg by mouth daily after lunch.     . vitamin E 400 UNIT capsule Take 400 Units by mouth daily after lunch.     . ALPRAZolam (XANAX) 0.5 MG tablet Take 0.25 mg by mouth 4 (four) times daily as needed for anxiety.     . baclofen (LIORESAL) 20 MG tablet Take 20 mg by mouth 2 (two) times daily as needed for muscle spasms.    . Ferrous Fumarate 63 (20 Fe) MG TABS Take 1 tablet by mouth 2 (two) times daily.    . hyaluronate sodium (RADIAPLEXRX) GEL Apply 1 application topically 2 (two) times daily.    . hydrOXYzine (ATARAX/VISTARIL) 50 MG tablet Take 50 mg by mouth every 6  (six) hours as needed for itching ((primarily taken at bedtime)).     . IRON PO Take 24 mg by mouth daily.    . metolazone (ZAROXOLYN) 2.5 MG tablet Take 1 tablet (2.5 mg total) by mouth as needed (for weight gain >3  lbs). (Patient not taking: Reported on 01/09/2017) 45 tablet 3  . SUMAtriptan (IMITREX) 100 MG tablet Take 100 mg by mouth daily as needed for migraine.      No current facility-administered medications for this encounter.     Physical Findings: The patient is in no acute distress. Patient is alert and oriented.  height is 5\' 5"  (1.651 m) and weight is 214 lb 6.4 oz (97.3 kg). Her oral temperature is 98 F (36.7 C). Her blood pressure is 113/95 (abnormal) and her pulse is 103 (abnormal). Her oxygen saturation is 99%. .  No significant changes. Lungs are clear to auscultation bilaterally. Heart has regular rate and rhythm. No palpable cervical, supraclavicular, or axillary adenopathy. Abdomen soft, non-tender, normal bowel sounds.Right chest wall shows mastectomy scar without signs of recurrence. Left breast has healed well. Patient has some mild hyperpigmentation changes.  No palpable mass, nipple discharge, tenderness, or bleeding.   Lab Findings: Lab Results  Component Value Date   WBC 8.5 12/21/2016   HGB 14.5 12/21/2016   HCT 44.9 12/21/2016   MCV 90.7 12/21/2016   PLT 206 12/21/2016    Radiographic Findings: Dg Chest 2 View  Result Date: 12/21/2016 CLINICAL DATA:  Shortness of breath for 3 months which worsened today. Mid to left chest pain. EXAM: CHEST  2 VIEW COMPARISON:  07/23/2016. FINDINGS: Trachea is midline. Heart is enlarged. Thoracic aorta is calcified. AICD lead is in the right ventricle. Biapical pleural thickening. Mild prominence of the interstitial markings, likely chronic. No pleural fluid. IMPRESSION: No acute findings. Electronically Signed   By: Lorin Picket M.D.   On: 12/21/2016 08:19    Impression:  The patient is recovering from the effects of  radiation. No evidence of recurrence on clinical exam.  Plan:  Patient will follow-up with radiation oncology on a prn basis. I recommended she take Tylenol for her breast pain. Patient will remain under close follow-up with medical oncology and surgery. I recommended she follow-up with her primary care physician concerning her rectal bleeding.  ____________________________________    This document serves as a record of services personally performed by Gery Pray, MD. It was created on his behalf by Bethann Humble, a trained medical scribe. The creation of this record is based on the scribe's personal observations and the provider's statements to them. This document has been checked and approved by the attending provider.

## 2017-01-09 NOTE — Progress Notes (Signed)
[name] is here for follow up after treatment to her left breast.  She reports having sharp pains in her left breast that started 3 weeks ago.  She said the pains happen most of the night.  She reports that her EF is now 15%.  She has trouble breathing laying flat and has to elevate her head to sleep.  She reports feeling fatigued.  She also said she has had blood in her stools since Monday.  She is taking eliquis.  The skin on her left breast has hyperpigmentation.  She has an area of redness under her left arm.  She has finished taking doxycyline.  BP (!) 113/95 (BP Location: Left Arm, Patient Position: Sitting)   Pulse (!) 103   Temp 98 F (36.7 C) (Oral)   Ht 5\' 5"  (1.651 m)   Wt 214 lb 6.4 oz (97.3 kg)   SpO2 99%   BMI 35.68 kg/m    Wt Readings from Last 3 Encounters:  01/09/17 214 lb 6.4 oz (97.3 kg)  12/21/16 215 lb (97.5 kg)  12/09/16 213 lb 12.8 oz (97 kg)

## 2017-01-14 ENCOUNTER — Other Ambulatory Visit: Payer: Self-pay | Admitting: *Deleted

## 2017-01-14 ENCOUNTER — Telehealth: Payer: Self-pay | Admitting: Cardiology

## 2017-01-14 NOTE — Telephone Encounter (Signed)
LMOVM requesting that pt send manual transmission b/c home monitor has not updated in at least 7 days.    

## 2017-01-15 ENCOUNTER — Other Ambulatory Visit: Payer: Self-pay | Admitting: Pharmacist

## 2017-01-15 NOTE — Patient Outreach (Signed)
[place] [facility]) Care Management  01/15/2017   [name] January 04, 1956 [id number]  RN Health Coach telephone call to patient.  [employer] compliance verified. Per patient her weight is 210.4 Patient stated she has swelling in her feet.  Patient monitors weight daily. Patient knows the zones and  action plan. Per patient she has an appointment on the 18 th and will possibly need a heart pump.  She stated she feels tired and weak all the time. Patient has been having to adjust her diuretic . She is also a diabetic and her blood sugars have been running good.  Patient stated she  tried to do chair exercises but has became very tired.  RN discussed eating habits. Per patient she is baking most of her foods. Patient eats frozen vegetables. Patient is having a lot  Of pain in her knee. Per patient she has been unable to send any remote transmission from her monitor due to her phone is not working properly. Patient is doing away with home phone line She will be able to be reached on [phone]. Patient has agreed to follow up outreach calls.  Current Medications:  Current Outpatient Prescriptions  Medication Sig Dispense Refill  . acetaminophen (TYLENOL) 650 MG CR tablet Take 1,300 mg by mouth every 8 (eight) hours as needed for pain.    Marland Kitchen albuterol (PROVENTIL HFA) 108 (90 BASE) MCG/ACT inhaler Inhale 2 puffs into the lungs every 6 (six) hours as needed. For shortness of breath    . ALPRAZolam (XANAX) 0.5 MG tablet Take 0.25 mg by mouth 4 (four) times daily as needed for anxiety.     Marland Kitchen amiodarone (PACERONE) 200 MG tablet Take 1 tablet (200 mg total) by mouth daily. 90 tablet 3  . apixaban (ELIQUIS) 5 MG TABS tablet Take 1 tablet (5 mg total) by mouth 2 (two) times daily. 180 tablet 3  . baclofen (LIORESAL) 20 MG tablet Take 20 mg by mouth 2 (two) times daily as needed for muscle spasms.    . busPIRone (BUSPAR) 30 MG tablet Take 30 mg by mouth 2 (two) times daily.     . Calcium  Carb-Cholecalciferol (CALCIUM 600+D3) 600-800 MG-UNIT TABS Take by mouth daily.    . carvedilol (COREG) 6.25 MG tablet Take 1 tablet (6.25 mg total) by mouth 2 (two) times daily with a meal. 180 tablet 3  . digoxin (LANOXIN) 0.125 MG tablet Take 1 tablet (0.125 mg total) by mouth every other day. 45 tablet 3  . docusate sodium (COLACE) 100 MG capsule Take 3 capsules (300 mg total) by mouth at bedtime. 10 capsule 0  . DULoxetine (CYMBALTA) 30 MG capsule Take 30 mg by mouth daily.    . Ferrous Fumarate 63 (20 Fe) MG TABS Take 1 tablet by mouth 2 (two) times daily.    . furosemide (LASIX) 20 MG tablet TAKE 3 TABLETS BY MOUTH 2  TIMES DAILY. IF 1 TO 2  POUND WEIGHT GAIN IN A DAY, TAKE EXTRA 20 MG DOSE 540 tablet 3  . Garlic 829 MG CAPS Take 500 mg by mouth daily after lunch.     Marland Kitchen HUMALOG KWIKPEN 100 UNIT/ML KiwkPen Inject 14-24 Units into the skin 3 (three) times daily. On average per patient 24 unit with breakfast, 16 units with lunch, & 14-16 units with supper    . HUMALOG MIX 75/25 KWIKPEN (75-25) 100 UNIT/ML Kwikpen Inject 75 Units into the skin 2 (two) times daily.    . hyaluronate sodium (RADIAPLEXRX) GEL Apply  1 application topically 2 (two) times daily.    . hydrOXYzine (ATARAX/VISTARIL) 50 MG tablet Take 50 mg by mouth every 6 (six) hours as needed for itching ((primarily taken at bedtime)).     . IRON PO Take 24 mg by mouth daily.    . ivabradine (CORLANOR) 5 MG TABS tablet Take 1 tablet (5 mg total) by mouth 2 (two) times daily with a meal. 180 tablet 3  . levothyroxine (SYNTHROID, LEVOTHROID) 75 MCG tablet Take 75 mcg by mouth daily before breakfast.     . meclizine (ANTIVERT) 12.5 MG tablet Take 1 tablet (12.5 mg total) by mouth 3 (three) times daily as needed for dizziness. 20 tablet 0  . Melatonin 1 MG TABS Take 0.5 mg by mouth at bedtime.    . metolazone (ZAROXOLYN) 2.5 MG tablet Take 1 tablet (2.5 mg total) by mouth as needed (for weight gain >3 lbs). (Patient not taking: Reported on  01/09/2017) 45 tablet 3  . mometasone (NASONEX) 50 MCG/ACT nasal spray Place 1 spray into both nostrils daily. allergies    . Multiple Minerals (CALCIUM-MAGNESIUM-ZINC) TABS Take 1 tablet by mouth daily after lunch.     . nitroGLYCERIN (NITROSTAT) 0.4 MG SL tablet Place 1 tablet (0.4 mg total) under the tongue every 5 (five) minutes as needed. For chest pain 25 tablet 3  . Omega-3 Fatty Acids (FISH OIL) 1000 MG CAPS Take 2 capsules by mouth daily.    . pantoprazole (PROTONIX) 40 MG tablet Take 40 mg by mouth 2 (two) times daily.      Vladimir Faster Glycol-Propyl Glycol (SYSTANE OP) Apply to eye as needed.    . polyethylene glycol powder (GLYCOLAX/MIRALAX) powder Mix 17 grams in 8 oz of water twice daily 850 g 3  . potassium chloride SA (K-DUR,KLOR-CON) 20 MEQ tablet Take 4 tablets (80 mEq total) by mouth 2 (two) times daily. 720 tablet 3  . Propylene Glycol (SYSTANE BALANCE) 0.6 % SOLN Place 1 drop into both eyes 3 (three) times daily.    Marland Kitchen pyridOXINE (VITAMIN B-6) 100 MG tablet Take 100 mg by mouth daily after lunch.     Marland Kitchen rOPINIRole (REQUIP) 1 MG tablet Take 1 mg by mouth at bedtime and may repeat dose one time if needed. IF RESTLESS LEGS PERSIST PATIENT MAY REPEAT ADDITIONAL DOSE    . rosuvastatin (CRESTOR) 10 MG tablet Take 10 mg by mouth at bedtime.    . sacubitril-valsartan (ENTRESTO) 49-51 MG Take 1 tablet by mouth 2 (two) times daily. 60 tablet 3  . Simethicone Extra Strength 125 MG CAPS Take 1 tablet by mouth daily as needed (for bloating). Reported on 02/23/2016    . spironolactone (ALDACTONE) 25 MG tablet Take 1 tablet (25 mg total) by mouth daily. 90 tablet 3  . SUMAtriptan (IMITREX) 100 MG tablet Take 100 mg by mouth daily as needed for migraine.     . tiotropium (SPIRIVA) 18 MCG inhalation capsule Place 18 mcg into inhaler and inhale daily.      . traMADol (ULTRAM) 50 MG tablet Take 1 tablet by mouth as needed.    . vitamin B-12 (CYANOCOBALAMIN) 1000 MCG tablet Take 1,000 mcg by mouth  daily after lunch.     . vitamin C (ASCORBIC ACID) 500 MG tablet Take 500 mg by mouth daily after lunch.     . vitamin E 400 UNIT capsule Take 400 Units by mouth daily after lunch.      No current facility-administered medications for this visit.  Functional Status:  In your present state of health, do you have any difficulty performing the following activities: 01/14/2017 12/12/2016  Hearing? N -  Vision? N -  Difficulty concentrating or making decisions? N -  Walking or climbing stairs? Y -  Dressing or bathing? N -  Doing errands, shopping? Y -  Conservation officer, nature and eating ? N N  Using the Toilet? Y Y  In the past six months, have you accidently leaked urine? Y Y  Do you have problems with loss of bowel control? N N  Managing your Medications? N N  Managing your Finances? N N  Housekeeping or managing your Housekeeping? N N  Some recent data might be hidden    Fall/Depression Screening: Fall Risk  01/14/2017 01/09/2017 12/12/2016  Falls in the past year? No No Yes  Number falls in past yr: - - -  Injury with Fall? - - -  Risk Factor Category  - - -  Risk for fall due to : Impaired balance/gait;Impaired mobility - Impaired balance/gait;Impaired mobility  Follow up - - -   PHQ 2/9 Scores 01/14/2017 01/09/2017 12/12/2016 08/08/2016 03/14/2016 01/24/2016 01/02/2016  PHQ - 2 Score 0 0 0 0 2 0 0  PHQ- 9 Score - - - - 7 - -   THN CM Care Plan Problem One     Most Recent Value  Care Plan Problem One  Knowledge Deficit in Self Management of Congestive Heart Failure  Role Documenting the Problem One  [place] for Problem One  Active  THN Long Term Goal   Patient will not have any admissions due to congestive heart within the next 90 days  THN Long Term Goal Start Date  01/14/17  Interventions for Problem One Long Term Goal  Patient will continue to weigh daily and follow up with action plan if gain greater than 2 pounds in 1 day of 5 pounds in a week. RN discussed medication  adherence. RN will follow up with discussion and teachback  THN CM Short Term Goal #1   Patient will follow up with sleep study results and getting a new cpap within the next 30 days  THN CM Short Term Goal #1 Start Date  01/14/17  Interventions for Short Term Goal #1  RN and patient discussed complications from not using cpap. RN sent EMMI educational material on cpap. Patient has not gotten cpap yet  THN CM Short Term Goal #2   Patient will report trying chair exercises within the next 30 days  THN CM Short Term Goal #2 Start Date  01/15/17  Interventions for Short Term Goal #2  RN sent patient chair exercises. RN discussed chair exercises.       Assessment:  Patient is unable to do home monitoring transmissions to cardiologist. Patient is having swelling lin lower extremities Patient has been informed by Dr office of action plan with diuretics Patient will continue to benefit from [place] telephonic outreach for education and support for CHF self management.    Plan:  Patient will continue to weigh q day Patient has a follow up appointment with cardiologist Patient will follow up with  on CPAP since it's approved RN discussed exercise routine RN will follow up within the month of July.  [facility] Care Management [phone]

## 2017-01-15 NOTE — Patient Outreach (Signed)
Have made three phone call attempts and sent letter. No response within 14 days. Will now close pharmacy episode.  Mckoy Bhakta, PharmD, BCACP Clinical Pharmacist [facility] Care Management [phone]  

## 2017-01-16 DIAGNOSIS — G4733 Obstructive sleep apnea (adult) (pediatric): Secondary | ICD-10-CM | POA: Diagnosis not present

## 2017-01-20 ENCOUNTER — Ambulatory Visit (INDEPENDENT_AMBULATORY_CARE_PROVIDER_SITE_OTHER): Payer: Medicare Other | Admitting: *Deleted

## 2017-01-20 ENCOUNTER — Encounter (HOSPITAL_COMMUNITY): Payer: Self-pay | Admitting: *Deleted

## 2017-01-20 ENCOUNTER — Ambulatory Visit (HOSPITAL_COMMUNITY)
Admission: RE | Admit: 2017-01-20 | Discharge: 2017-01-20 | Disposition: A | Discharge status: Home / Self Care | Payer: Medicare Other | Source: Ambulatory Visit | Attending: Internal Medicine | Admitting: Internal Medicine

## 2017-01-20 VITALS — BP 102/52 | HR 83 | Wt 214.0 lb

## 2017-01-20 DIAGNOSIS — Z8249 Family history of ischemic heart disease and other diseases of the circulatory system: Secondary | ICD-10-CM | POA: Diagnosis not present

## 2017-01-20 DIAGNOSIS — I110 Hypertensive heart disease with heart failure: Secondary | ICD-10-CM | POA: Insufficient documentation

## 2017-01-20 DIAGNOSIS — I2510 Atherosclerotic heart disease of native coronary artery without angina pectoris: Secondary | ICD-10-CM | POA: Insufficient documentation

## 2017-01-20 DIAGNOSIS — Z79891 Long term (current) use of opiate analgesic: Secondary | ICD-10-CM | POA: Insufficient documentation

## 2017-01-20 DIAGNOSIS — G4733 Obstructive sleep apnea (adult) (pediatric): Secondary | ICD-10-CM | POA: Insufficient documentation

## 2017-01-20 DIAGNOSIS — I427 Cardiomyopathy due to drug and external agent: Secondary | ICD-10-CM | POA: Insufficient documentation

## 2017-01-20 DIAGNOSIS — C50912 Malignant neoplasm of unspecified site of left female breast: Secondary | ICD-10-CM | POA: Diagnosis not present

## 2017-01-20 DIAGNOSIS — Z79899 Other long term (current) drug therapy: Secondary | ICD-10-CM | POA: Diagnosis not present

## 2017-01-20 DIAGNOSIS — Z808 Family history of malignant neoplasm of other organs or systems: Secondary | ICD-10-CM | POA: Diagnosis not present

## 2017-01-20 DIAGNOSIS — E785 Hyperlipidemia, unspecified: Secondary | ICD-10-CM | POA: Insufficient documentation

## 2017-01-20 DIAGNOSIS — Z7901 Long term (current) use of anticoagulants: Secondary | ICD-10-CM | POA: Diagnosis not present

## 2017-01-20 DIAGNOSIS — Z801 Family history of malignant neoplasm of trachea, bronchus and lung: Secondary | ICD-10-CM | POA: Insufficient documentation

## 2017-01-20 DIAGNOSIS — Z803 Family history of malignant neoplasm of breast: Secondary | ICD-10-CM | POA: Insufficient documentation

## 2017-01-20 DIAGNOSIS — F419 Anxiety disorder, unspecified: Secondary | ICD-10-CM | POA: Diagnosis not present

## 2017-01-20 DIAGNOSIS — I5023 Acute on chronic systolic (congestive) heart failure: Secondary | ICD-10-CM | POA: Diagnosis not present

## 2017-01-20 DIAGNOSIS — Z833 Family history of diabetes mellitus: Secondary | ICD-10-CM | POA: Insufficient documentation

## 2017-01-20 DIAGNOSIS — Z923 Personal history of irradiation: Secondary | ICD-10-CM | POA: Insufficient documentation

## 2017-01-20 DIAGNOSIS — F329 Major depressive disorder, single episode, unspecified: Secondary | ICD-10-CM | POA: Diagnosis not present

## 2017-01-20 DIAGNOSIS — I472 Ventricular tachycardia: Secondary | ICD-10-CM | POA: Diagnosis not present

## 2017-01-20 DIAGNOSIS — I5022 Chronic systolic (congestive) heart failure: Secondary | ICD-10-CM | POA: Diagnosis not present

## 2017-01-20 DIAGNOSIS — J449 Chronic obstructive pulmonary disease, unspecified: Secondary | ICD-10-CM | POA: Diagnosis not present

## 2017-01-20 DIAGNOSIS — I480 Paroxysmal atrial fibrillation: Secondary | ICD-10-CM | POA: Diagnosis not present

## 2017-01-20 LAB — BASIC METABOLIC PANEL
ANION GAP: 9 (ref 5–15)
BUN: 19 mg/dL (ref 6–20)
CO2: 30 mmol/L (ref 22–32)
Calcium: 9.6 mg/dL (ref 8.9–10.3)
Chloride: 95 mmol/L — ABNORMAL LOW (ref 101–111)
Creatinine, Ser: 1.08 mg/dL — ABNORMAL HIGH (ref 0.44–1.00)
GFR calc Af Amer: >60 mL/min (ref >60)
GFR, EST NON AFRICAN AMERICAN: 54 mL/min — AB (ref >60)
Glucose, Bld: 315 mg/dL — ABNORMAL HIGH (ref 65–99)
POTASSIUM: 3.5 mmol/L (ref 3.5–5.1)
SODIUM: 134 mmol/L — AB (ref 135–145)

## 2017-01-20 MED ORDER — IVABRADINE HCL 5 MG PO TABS: 7.5000 mg | ORAL_TABLET | Freq: Two times a day (BID) | ORAL | 3 refills | Status: DC

## 2017-01-20 NOTE — Progress Notes (Signed)
Initial Encounter with LVAD Team and MCS Introduction:  [name] is a 61 y.o. female whom  has a past medical history of AICD (automatic cardioverter/defibrillator) present; Anxiety; AODM (08/13/2007); Asthma; Axillary mass, left (10/17/2016); Breast cancer ([place]) (2005, 2017); CHF (congestive heart failure) ([place]); COPD (chronic obstructive pulmonary disease) ([place]); Depression; DYSLIPIDEMIA (05/01/2009); Dysrhythmia; Family history of breast cancer; Family history of colon cancer; Family history of uterine cancer; Furuncle (10/17/2016); Gastroparesis; GERD (gastroesophageal reflux disease); Headache; History of breast cancer (2006); History of external beam radiation therapy (09/25/16-11/13/16); Hypertension; HYPOTHYROIDISM-IATROGENIC (08/08/2008); IBS (irritable bowel syndrome); ICD (implantable cardiac defibrillator) in place (11/10/2012); Malignant neoplasm of lower-inner quadrant of left female breast ([place]) (06/26/2016); Myocardial infarction [facility]); OBESITY (07/24/2007); OBSTRUCTIVE SLEEP APNEA (07/24/2007); Osteoarthritis; Persistent atrial fibrillation ([place]) (09/2014); Personal history of radiation therapy; Presence of permanent cardiac pacemaker; RESTLESS LEG SYNDROME (33/38/3291); and SYSTOLIC HEART FAILURE, CHRONIC (01/05/2009).. We have been asked to evaluate the patient for advanced therapies which include Left Ventricular Assist Device implantation.   Lab Results  Component Value Date   ABORH A POS 05/27/2016    Lab Results  Component Value Date   HGBA1C 8.1 (H) 07/19/2016   Lab Results  Component Value Date   CREATININE 1.08 (H) 01/20/2017   CREATININE 1.07 (H) 12/23/2016   CREATININE 0.96 12/21/2016    Explained that LVAD can be implanted for two indications in the setting of advanced left ventricular heart failure treatment:  Bridge to transplant - used for patients who cannot safely wait for heart transplant without this device.  Or   Destination therapy - used for patients  until end of life or recovery of heart function.  Discussed that at this point LIDWINA KANER would be considered for Destination therapy should she be deemed an acceptable VAD candidate.   Provided brief equipment overview of the LVAD pump and discussed placement, surgical procedure, peripheral equipment, life-long coumadin therapy, importance of medication adherence and clinic follow up for as long as patient is living on support, life-style modifications, as well as need for caregiver to be successful with this therapy.   The patient verbalized understanidng. We discussed the process of the evaluation period and how a decision was made by the [facility] team whether she would be an appropriate candidate for therapy or not.   Caregiver Support: sister, previous nurse  Home Inspection Checklist: verified that patient has reliable telephone, running water and electricity in the home.   Advised the patient review the materials, contact either myself or another coordinator with questions and we will plan on meeting with her at next scheduled clinic appointment. Verbalized she would review the evaluation consent and make a decision regarding the evaluation process. I plan to reach out to her later this week to discuss scheduling  tests/procedures needed to complete evaluation.   Session Time: 30 minutes  Balinda Quails RN, [place] Coordinator 24/7 pager [phone]

## 2017-01-20 NOTE — Addendum Note (Signed)
Encounter addended by: Harvie Junior, CMA on: 01/20/2017  2:41 PM<BR>    Actions taken: Visit diagnoses modified, Diagnosis association updated, Order list changed

## 2017-01-20 NOTE — Patient Instructions (Signed)
Labs today (will call for abnormal results, otherwise no news is good news)  INCREASE Corlanor to 7.5 mg (1.5 Tabs) Twice Daily  Follow up in 1 Month

## 2017-01-20 NOTE — Progress Notes (Signed)
Patient ID: [name], female   DOB: 1955-12-14, 61 y.o.   MRN: [id number]    Advanced Heart Failure Clinic Note   Endocrinologist: Dr [name]  Oncologist: Dr. Humphrey [name] PCP: Joanne Peace, NP ([place]) Primary HF MD: Dr [name]  HPI [name] is a 61 year old female with history of R breast cancer (2005, 2008), L beast cancer (2017) obesity, HTN, DM, IBS, HLD, OSA, chemo induced cardiomyopathy s/p ICD and chronic systolic HF.    She had chemo induced cardiomyopathy.  Her EF has fallen from 45% per Cardiac MRI in July 2010 to 20-25% in 10/2012. S/P single chamber ICD (St Jude) implantation on 11/10/12 by Dr. Rayann [name].    Admitted 08/19/15 with chest pain. Had [facility] with mild CAD and increased filling pressures.  Admitted 12/21/15 with increased dyspnea and chest pain. Additionally, a new breast lump was discovered, with outpatient follow up arranged. She diuresed 15 lbs. Discharge weight 213 lbs.  Admitted 7/17 with GIB. 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 Ardis [name]. Eliquis restarted without problem.   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 Had lumpectomy 07/24/16 with Dr. Dalbert [name]. Completed XRT. No chemo planned.  She returns today for follow up. We saw her last month with NYHA IIIB symptoms. Entresto increased. Repeat CPX ordered which showed severe HF limitation.Lasix also adjusted. 2 weeks ago called clinic because BP was low and weight down 10-15 pounds. Lasix held for several days then reduced to 60mg  daily. Now taking lasix 40 bid. That has kept weight down 209-211. Feeling better but still SOB with any activity. Occasional CP but improved with CPAP. No edema. No orthopnea or PND. Gor CPAP fixed last week and now sleeping much better.    ICD interrogated personally: Volume looks good. No VT. No atrial lead.   CPX 12/23/16: FVC 2.15 (66%)    FEV1 1.57 (62%)     FEV1/FVC 73% (92%)     MVV 58 (62%)      Resting  HR: 93 Peak HR: 116  (73% age predicted max HR) BP rest: 88/60 BP peak: 104/60  Peak VO2: 9.2 (52% predicted peak VO2) VE/VCO2 slope: 51 OUES: 1.07 Peak RER: 0.99 Ventilatory Threshold: 8.0 (45% predicted or measured peak VO2) VE/MVV: 71% O2pulse: 8  (73% predicted O2pulse)  CPX 09/11/2015  Peak VO2: 12.7 (73% predicted peak VO2) VE/VCO2 slope: 32.3 OUES: 1.62 Peak RER: 1.08    RHC/LCH  08/20/2014   Mid Cx lesion, 30% stenosed.  3rd Mrg lesion, 30% stenosed. Findings: RA = 12 RV = 55/6/12 PA = 54/21 (34) PCW = 22 Ao 93/58 (71) LV 106/8/26 Fick cardiac output/index = 4.2/2.1 PVR = 2.8 SVR = 1115 FA sat = 93% PA sat = 54%, 53% Assessment: 1. Very mild CAD 2. Mild to moderately increased filling pressures 3. Severe LV dysfunction due to NICM EF 20%medications.   [place] on 01/30/12 showed decompensated HF with low cardiac output consistent with cardiogenic shock.  [place] 6/27  RA = 20  RV = 55/13/22  PA =56/34 (44)  PCW = 28  Fick cardiac output/index = 3.1/1.5  PVR = 5.3 [place]  FA sat = 91%  PA sat = 42%, 48%  SVC sat = 46%  Echo 08/12/11: Left ventricle: mildly dilated, EF 20% to 25%. Diffuse hypokinesis. Doppler parameters are consistent with restrictive physiology, indicative of decreased left ventricular diastolic compliance and/or increased left atrial pressure. Left atrium was moderately dilated.  Right atrium mildly dilated. 04/09/12 ECHO EF 30-35% LV mildly dilated  Hyopkinesis 10/07/12 ECHO EF 20-25% RV ok. 03/31/14: ECHO EF 30-35%, severe HK inferior/inferoseptal walls, mild MR, LA mod dilated, RA mildly dilated 06/28/2015: ECHO EF 25% RV mildly dilated. Severe HK inferior wall.  08/21/2015: Echo EF 30-35% Grade III DD  Myoview in 05/2010 EF 59%. No ischemia.   Review of systems complete and found to be negative unless listed in HPI.    Social History   Social History  . Marital status: Divorced    Spouse name: N/A  . Number of children: 1  . Years of  education: N/A   Occupational History  . Disabled Unemployed   Social History Main Topics  . Smoking status: Former Smoker    Packs/day: 1.50    Years: 33.00    Types: Cigarettes    Quit date: 04/12/2001  . Smokeless tobacco: Never Used     Comment: heaviest - 2.5- 3PPD x 3 years   . Alcohol use No  . Drug use: No  . Sexual activity: Not Currently   Other Topics Concern  . Not on file   Social History Narrative   Lives with mother and brother [name]         Family History  Problem Relation Age of Onset  . Diabetes Mellitus II Mother   . Lung cancer Mother   . Congestive Heart Failure Mother   . Coronary artery disease Father        CABG x 3  . Hypertension Father   . Diabetes Mellitus II Father   . Breast cancer Cousin 88       maternal first cousin  . Colon cancer Cousin 91       maternal first cousin  . Uterine cancer Cousin        dx in her 74s  . Colon cancer Maternal Aunt        dx in her late 67s  . Uterine cancer Maternal Aunt 66  . Lung cancer Maternal Aunt   . Uterine cancer Maternal Aunt 38  . Colon cancer Maternal Aunt 52  . Breast cancer Maternal Grandmother 53  . Uterine cancer Maternal Aunt 48  . Uterine cancer Cousin        dx in her 26s  . Uterine cancer Cousin 21    Past Medical History:  Diagnosis Date  . AICD (automatic cardioverter/defibrillator) present    st jude  . Anxiety   . 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 Asthma   . Axillary mass, left 10/17/2016  . Breast cancer ([place]) 2005, 2017  . CHF (congestive heart failure) ([place])   . COPD (chronic obstructive pulmonary disease) ([place])   . Depression    followed by a psychiatrist  . DYSLIPIDEMIA 05/01/2009  . Dysrhythmia    atrial fibrillation  . Family history of breast cancer   . Family history of colon cancer   . Family history of uterine cancer   . [place] 10/17/2016  . Gastroparesis   . GERD (gastroesophageal reflux disease)   . Headache   . History of breast cancer 2006   with return  in 2008, s/p mastectomy, XRT and chemotherapy  . History of external beam radiation therapy 09/25/16-11/13/16   left breast treated to 50.4 Gy in 28 fractions, boost 12 Gy in 6 fractions  . Hypertension   . 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 IBS (irritable bowel syndrome)   . ICD (implantable cardiac defibrillator) in place 11/10/2012   ICD (  11/2012)  . Malignant neoplasm of lower-inner quadrant of left female breast ([place]) 06/26/2016  . Myocardial infarction ([place])   . OBESITY 07/24/2007  . OBSTRUCTIVE SLEEP APNEA 07/24/2007   with CPAP compliance  . Osteoarthritis   . Persistent atrial fibrillation ([place]) 09/2014  . Personal history of radiation therapy    current  . Presence of permanent cardiac pacemaker   . RESTLESS LEG SYNDROME 07/24/2007  . SYSTOLIC HEART FAILURE, CHRONIC 01/05/2009   a. chemo induced cardiomyopathy b. cMRI (02/2009) EF 45% c. Myoview 05/2010: EF 59%, no ischemia d. RHC (01/2012) RA 20, RV 55/13/22, PA 56/34 (44), PCWP 28, Fick CO/CI: 3.1 / 1.5, PVR 5.3 WU, PA sat 42% & 48% e. ECHO (10/2012) EF 20-25%, diff HK, MV calcified annulus f. ECHO (03/2014) EF 30-35%, sev HK, inferior/inferoseptal walls, mild MR    Current Outpatient Prescriptions  Medication Sig Dispense Refill  . acetaminophen (TYLENOL) 650 MG CR tablet Take 1,300 mg by mouth every 8 (eight) hours as needed for pain.    Marland Kitchen albuterol (PROVENTIL HFA) 108 (90 BASE) MCG/ACT inhaler Inhale 2 puffs into the lungs every 6 (six) hours as needed. For shortness of breath    . ALPRAZolam (XANAX) 0.5 MG tablet Take 0.25 mg by mouth 4 (four) times daily as needed for anxiety.     Marland Kitchen amiodarone (PACERONE) 200 MG tablet Take 1 tablet (200 mg total) by mouth daily. 90 tablet 3  . apixaban (ELIQUIS) 5 MG TABS tablet Take 1 tablet (5 mg total) by mouth 2 (two) times daily. 180 tablet 3  . furosemide (LASIX) 20 MG tablet Take 20-40 mg by mouth as directed.    . baclofen (LIORESAL) 20 MG tablet Take 20 mg by mouth 2 (two) times daily  as needed for muscle spasms.    . busPIRone (BUSPAR) 30 MG tablet Take 30 mg by mouth 2 (two) times daily.     . Calcium Carb-Cholecalciferol (CALCIUM 600+D3) 600-800 MG-UNIT TABS Take by mouth daily.    . carvedilol (COREG) 6.25 MG tablet Take 1 tablet (6.25 mg total) by mouth 2 (two) times daily with a meal. 180 tablet 3  . digoxin (LANOXIN) 0.125 MG tablet Take 1 tablet (0.125 mg total) by mouth every other day. 45 tablet 3  . docusate sodium (COLACE) 100 MG capsule Take 3 capsules (300 mg total) by mouth at bedtime. 10 capsule 0  . DULoxetine (CYMBALTA) 30 MG capsule Take 30 mg by mouth daily.    . Ferrous Fumarate 63 (20 Fe) MG TABS Take 1 tablet by mouth 2 (two) times daily.    . Garlic 960 MG CAPS Take 500 mg by mouth daily after lunch.     Marland Kitchen HUMALOG KWIKPEN 100 UNIT/ML KiwkPen Inject 14-24 Units into the skin 3 (three) times daily. On average per patient 24 unit with breakfast, 16 units with lunch, & 14-16 units with supper    . HUMALOG MIX 75/25 KWIKPEN (75-25) 100 UNIT/ML Kwikpen Inject 75 Units into the skin 2 (two) times daily.    . hyaluronate sodium (RADIAPLEXRX) GEL Apply 1 application topically 2 (two) times daily.    . hydrOXYzine (ATARAX/VISTARIL) 50 MG tablet Take 50 mg by mouth every 6 (six) hours as needed for itching ((primarily taken at bedtime)).     . IRON PO Take 24 mg by mouth daily.    . ivabradine (CORLANOR) 5 MG TABS tablet Take 1 tablet (5 mg total) by mouth 2 (two) times daily with a meal. 180 tablet  3  . levothyroxine (SYNTHROID, LEVOTHROID) 75 MCG tablet Take 75 mcg by mouth daily before breakfast.     . meclizine (ANTIVERT) 12.5 MG tablet Take 1 tablet (12.5 mg total) by mouth 3 (three) times daily as needed for dizziness. 20 tablet 0  . Melatonin 1 MG TABS Take 0.5 mg by mouth at bedtime.    . metolazone (ZAROXOLYN) 2.5 MG tablet Take 1 tablet (2.5 mg total) by mouth as needed (for weight gain >3 lbs). (Patient not taking: Reported on 01/09/2017) 45 tablet 3  .  mometasone (NASONEX) 50 MCG/ACT nasal spray Place 1 spray into both nostrils daily. allergies    . Multiple Minerals (CALCIUM-MAGNESIUM-ZINC) TABS Take 1 tablet by mouth daily after lunch.     . nitroGLYCERIN (NITROSTAT) 0.4 MG SL tablet Place 1 tablet (0.4 mg total) under the tongue every 5 (five) minutes as needed. For chest pain 25 tablet 3  . Omega-3 Fatty Acids (FISH OIL) 1000 MG CAPS Take 2 capsules by mouth daily.    . pantoprazole (PROTONIX) 40 MG tablet Take 40 mg by mouth 2 (two) times daily.      Vladimir Faster Glycol-Propyl Glycol (SYSTANE OP) Apply to eye as needed.    . polyethylene glycol powder (GLYCOLAX/MIRALAX) powder Mix 17 grams in 8 oz of water twice daily 850 g 3  . potassium chloride SA (K-DUR,KLOR-CON) 20 MEQ tablet Take 4 tablets (80 mEq total) by mouth 2 (two) times daily. 720 tablet 3  . Propylene Glycol (SYSTANE BALANCE) 0.6 % SOLN Place 1 drop into both eyes 3 (three) times daily.    Marland Kitchen pyridOXINE (VITAMIN B-6) 100 MG tablet Take 100 mg by mouth daily after lunch.     Marland Kitchen rOPINIRole (REQUIP) 1 MG tablet Take 1 mg by mouth at bedtime and may repeat dose one time if needed. IF RESTLESS LEGS PERSIST PATIENT MAY REPEAT ADDITIONAL DOSE    . rosuvastatin (CRESTOR) 10 MG tablet Take 10 mg by mouth at bedtime.    . sacubitril-valsartan (ENTRESTO) 49-51 MG Take 1 tablet by mouth 2 (two) times daily. 60 tablet 3  . Simethicone Extra Strength 125 MG CAPS Take 1 tablet by mouth daily as needed (for bloating). Reported on 02/23/2016    . spironolactone (ALDACTONE) 25 MG tablet Take 1 tablet (25 mg total) by mouth daily. 90 tablet 3  . SUMAtriptan (IMITREX) 100 MG tablet Take 100 mg by mouth daily as needed for migraine.     . tiotropium (SPIRIVA) 18 MCG inhalation capsule Place 18 mcg into inhaler and inhale daily.      . traMADol (ULTRAM) 50 MG tablet Take 1 tablet by mouth as needed.    . vitamin B-12 (CYANOCOBALAMIN) 1000 MCG tablet Take 1,000 mcg by mouth daily after lunch.     .  vitamin C (ASCORBIC ACID) 500 MG tablet Take 500 mg by mouth daily after lunch.     . vitamin E 400 UNIT capsule Take 400 Units by mouth daily after lunch.      No current facility-administered medications for this encounter.    Vitals:   01/20/17 1353  BP: (!) 102/52  Pulse: 83  SpO2: 98%  Weight: 214 lb (97.1 kg)   Wt Readings from Last 3 Encounters:  01/20/17 214 lb (97.1 kg)  01/09/17 214 lb 6.4 oz (97.3 kg)  12/21/16 215 lb (97.5 kg)     PHYSICAL EXAM: General: Well appearing. No resp difficulty. HEENT: normal anicteric  Neck: supple. JVD 5-6 cm Carotids 2+  bilat; no bruits. No thyromegaly or nodule noted. Cor: PMI laterally displaced.Regular. No M/G/R noted Lungs: CTAB, no wheezing  Abdomen: obese soft NT/ND  no HSM. No bruits or masses. +BS  Extremities: no cyanosis, clubbing, or rash. trace 1+ ankle edema bilaterally.  Neuro: alert & oriented x 3, cranial nerves grossly intact. moves all 4 extremities w/o difficulty. Affect pleasant   ASSESSMENT & PLAN:  1) Chronic systolic HF, NICM thought to be chemo induced, s/p ST Jude  ICD.ECHO 06/28/2015  EF ~20%. RV mildly dilated. Echo 1/17 30-35%. Ech 5/18 EF 15% RV ok - NYHA IIIb-IV symptoms.  - CPX confirms severe HF limitation  - Unclear if AF is contributing to worsening EF and HF but now back in NSR.  - Discussed VAD therapy and she is interested. Will begin screening process. - Continue lasix 40 mg BID with extra as needed.  - Continue metolazone 2.5 mg as needed with extra 40 meq of potassium.  - Continue spiro 25 mg daily.  - Continue coreg 6.25 mg BID  - Continue Entresto 49/51 mg BID.  - Continue digoxin 0.125 mg daily .  - Increase corlanor to 7.5 mg twice a day.     - Reinforced fluid restriction to < 2 L daily, sodium restriction to less than 2000 mg daily, and the importance of daily weights.   2) Paroxysmal Afib -  EKG today shows NSR with 1AVB 91 bp,  - Continue Eliquis 5 mg BID. Denies bleeding.  -  CHADsVASC 4 (HTN, CAD, HF, gender) 3) OSA - Now has CPAP fixed. Continue to use.  4) HLD - Followed by PCP.  5) CAD - mild nonobstructive CAD on 2016 cath.Will need repeat cath as part of VAD w/u.  - No S/S ischemia. Continue statin, BB, and ACE-I.  6) NSVT - Continue amio 200 mg daily. Needs yearly eye exam. TSH stable 09/2016   7) Breast Cancer, bialterally - most recent left breast CA  s/p lumpectomy and Radiation 07/2016  Total time spent 45 minutes. Over half that time spent discussing above.    Glori Bickers, MD  2:30 PM

## 2017-01-20 NOTE — Addendum Note (Signed)
Encounter addended by: Kennieth Rad, RN on: 01/20/2017  2:53 PM<BR>    Actions taken: Diagnosis association updated, Order list changed, Sign clinical note

## 2017-01-21 ENCOUNTER — Other Ambulatory Visit (HOSPITAL_COMMUNITY): Payer: Self-pay | Admitting: *Deleted

## 2017-01-21 ENCOUNTER — Encounter (HOSPITAL_COMMUNITY): Payer: Self-pay | Admitting: *Deleted

## 2017-01-21 ENCOUNTER — Telehealth (HOSPITAL_COMMUNITY): Payer: Self-pay | Admitting: *Deleted

## 2017-01-21 DIAGNOSIS — I509 Heart failure, unspecified: Secondary | ICD-10-CM

## 2017-01-21 LAB — CUP PACEART REMOTE DEVICE CHECK
Battery Remaining Longevity: 65 mo
Battery Voltage: 2.96 V
Date Time Interrogation Session: 20180618175054
HIGH POWER IMPEDANCE MEASURED VALUE: 63 Ohm
HighPow Impedance: 63 Ohm
Implantable Lead Location: 753860
Implantable Pulse Generator Implant Date: 20140408
Lead Channel Sensing Intrinsic Amplitude: 12 mV
Lead Channel Setting Pacing Amplitude: 2.5 V
Lead Channel Setting Pacing Pulse Width: 0.5 ms
MDC IDC LEAD IMPLANT DT: 20140408
MDC IDC MSMT BATTERY REMAINING PERCENTAGE: 61 %
MDC IDC MSMT LEADCHNL RV IMPEDANCE VALUE: 400 Ohm
MDC IDC MSMT LEADCHNL RV PACING THRESHOLD AMPLITUDE: 0.75 V
MDC IDC MSMT LEADCHNL RV PACING THRESHOLD PULSEWIDTH: 0.5 ms
MDC IDC SET LEADCHNL RV SENSING SENSITIVITY: 0.5 mV
MDC IDC STAT BRADY RV PERCENT PACED: 1 %
Pulse Gen Serial Number: 7066421

## 2017-01-21 NOTE — Progress Notes (Signed)
Appointments made to complete VAD evaluation. Instructions given, per patient request, to her sister, lenore. Verbalized understanding.   Balinda Quails RN, VAD Coordinator 24/7 pager [phone]

## 2017-01-21 NOTE — Telephone Encounter (Signed)
No pre cert reqd case #[id number]

## 2017-01-21 NOTE — Progress Notes (Signed)
Remote ICD transmission.   

## 2017-01-22 ENCOUNTER — Other Ambulatory Visit (HOSPITAL_COMMUNITY): Payer: Self-pay | Admitting: *Deleted

## 2017-01-22 ENCOUNTER — Encounter (HOSPITAL_COMMUNITY): Payer: Self-pay

## 2017-01-22 ENCOUNTER — Ambulatory Visit (HOSPITAL_COMMUNITY)
Admission: RE | Admit: 2017-01-22 | Discharge: 2017-01-22 | Disposition: A | Discharge status: Home / Self Care | Payer: Medicare Other | Source: Ambulatory Visit | Attending: Internal Medicine | Admitting: Internal Medicine

## 2017-01-22 ENCOUNTER — Ambulatory Visit (HOSPITAL_BASED_OUTPATIENT_CLINIC_OR_DEPARTMENT_OTHER)
Admission: RE | Admit: 2017-01-22 | Discharge: 2017-01-22 | Disposition: A | Discharge status: Home / Self Care | Payer: Medicare Other | Source: Ambulatory Visit | Attending: Internal Medicine | Admitting: Internal Medicine

## 2017-01-22 ENCOUNTER — Other Ambulatory Visit (HOSPITAL_COMMUNITY): Payer: Self-pay | Admitting: Internal Medicine

## 2017-01-22 ENCOUNTER — Ambulatory Visit (HOSPITAL_COMMUNITY)
Admission: RE | Admit: 2017-01-22 | Discharge: 2017-01-22 | Disposition: A | Discharge status: Home / Self Care | Payer: Medicare Other | Source: Ambulatory Visit

## 2017-01-22 DIAGNOSIS — I6523 Occlusion and stenosis of bilateral carotid arteries: Secondary | ICD-10-CM | POA: Diagnosis not present

## 2017-01-22 DIAGNOSIS — I509 Heart failure, unspecified: Secondary | ICD-10-CM | POA: Diagnosis not present

## 2017-01-22 LAB — VAS US DOPPLER PRE VAD
LCCAPSYS: 80 cm/s
LEFT ECA DIAS: -17 cm/s
LEFT VERTEBRAL DIAS: -8 cm/s
LICADDIAS: -30 cm/s
LICAPDIAS: -22 cm/s
LICAPSYS: -73 cm/s
Left CCA dist dias: -24 cm/s
Left CCA dist sys: -75 cm/s
Left CCA prox dias: 15 cm/s
Left ICA dist sys: -83 cm/s
RIGHT ECA DIAS: -26 cm/s
RIGHT VERTEBRAL DIAS: -17 cm/s
Right CCA prox dias: 17 cm/s
Right CCA prox sys: 77 cm/s
Right cca dist sys: -91 cm/s

## 2017-01-22 NOTE — Progress Notes (Signed)
Pre-op VAD Surgery  Carotid Findings:  Bilateral 1-39% ICA stenosis, antegrade vertebral flow.   Lower  Extremity Right Left  Dorsalis Pedis 114, Bi 119, Bi  Posterior Tibial 114, Bi 121, Bi  Ankle/Brachial Indices 0.93 0.98    Findings:  Bilateral ABI's fall within normal range.  [place], RVT 4:05 PM  01/22/2017

## 2017-01-22 NOTE — Progress Notes (Signed)
*  PRELIMINARY RESULTS* Vascular Ultrasound Bilateral lower extremity venous duplex has been completed.  Preliminary findings: No evidence of deep vein thrombosis in the visualized veins of the lower extremities. Negative for baker's cysts bilaterally.   [name] 01/22/2017, 3:54 PM

## 2017-01-23 ENCOUNTER — Ambulatory Visit (HOSPITAL_COMMUNITY): Admission: RE | Admit: 2017-01-23 | Payer: Medicare Other | Source: Ambulatory Visit

## 2017-01-23 ENCOUNTER — Ambulatory Visit (HOSPITAL_COMMUNITY)
Admission: RE | Admit: 2017-01-23 | Discharge: 2017-01-23 | Disposition: A | Discharge status: Home / Self Care | Payer: Medicare Other | Source: Ambulatory Visit | Attending: Internal Medicine | Admitting: Internal Medicine

## 2017-01-23 ENCOUNTER — Encounter: Payer: Self-pay | Admitting: Cardiology

## 2017-01-23 ENCOUNTER — Other Ambulatory Visit (HOSPITAL_COMMUNITY): Payer: Self-pay | Admitting: Unknown Physician Specialty

## 2017-01-23 DIAGNOSIS — Z95811 Presence of heart assist device: Secondary | ICD-10-CM

## 2017-01-23 DIAGNOSIS — R935 Abnormal findings on diagnostic imaging of other abdominal regions, including retroperitoneum: Secondary | ICD-10-CM | POA: Diagnosis not present

## 2017-01-23 DIAGNOSIS — J439 Emphysema, unspecified: Secondary | ICD-10-CM | POA: Insufficient documentation

## 2017-01-23 DIAGNOSIS — Z7901 Long term (current) use of anticoagulants: Secondary | ICD-10-CM | POA: Diagnosis not present

## 2017-01-23 DIAGNOSIS — I2510 Atherosclerotic heart disease of native coronary artery without angina pectoris: Secondary | ICD-10-CM | POA: Diagnosis not present

## 2017-01-23 DIAGNOSIS — Z01818 Encounter for other preprocedural examination: Secondary | ICD-10-CM | POA: Diagnosis not present

## 2017-01-23 DIAGNOSIS — I700 Atherosclerosis of aorta: Secondary | ICD-10-CM | POA: Diagnosis not present

## 2017-01-23 DIAGNOSIS — K000 Anodontia: Secondary | ICD-10-CM | POA: Insufficient documentation

## 2017-01-23 DIAGNOSIS — R918 Other nonspecific abnormal finding of lung field: Secondary | ICD-10-CM | POA: Diagnosis not present

## 2017-01-23 DIAGNOSIS — J90 Pleural effusion, not elsewhere classified: Secondary | ICD-10-CM | POA: Diagnosis not present

## 2017-01-23 DIAGNOSIS — I517 Cardiomegaly: Secondary | ICD-10-CM | POA: Diagnosis not present

## 2017-01-23 MED ORDER — IOPAMIDOL (ISOVUE-300) INJECTION 61%
INTRAVENOUS | Status: AC
  Administered 2017-01-23: 100 mL
  Filled 2017-01-23: qty 100

## 2017-01-24 ENCOUNTER — Telehealth: Payer: Self-pay | Admitting: Licensed Clinical Social Worker

## 2017-01-24 NOTE — Telephone Encounter (Signed)
CSW contacted patient's sister to schedule LVAD assessment for Tuesday February 04, 2017 at 2:30pm in the HF clinic. Patient's sister confirmed. Raquel Sarna, [place], [place]

## 2017-01-27 ENCOUNTER — Other Ambulatory Visit (HOSPITAL_COMMUNITY): Payer: Self-pay | Admitting: *Deleted

## 2017-01-27 ENCOUNTER — Ambulatory Visit (HOSPITAL_COMMUNITY)
Admission: RE | Admit: 2017-01-27 | Discharge: 2017-01-27 | Disposition: A | Discharge status: Home / Self Care | Payer: Medicare Other | Source: Ambulatory Visit | Attending: Internal Medicine | Admitting: Internal Medicine

## 2017-01-27 ENCOUNTER — Telehealth (HOSPITAL_COMMUNITY): Payer: Self-pay | Admitting: Pharmacist

## 2017-01-27 DIAGNOSIS — R570 Cardiogenic shock: Secondary | ICD-10-CM | POA: Diagnosis not present

## 2017-01-27 DIAGNOSIS — I5082 Biventricular heart failure: Secondary | ICD-10-CM | POA: Diagnosis not present

## 2017-01-27 DIAGNOSIS — I481 Persistent atrial fibrillation: Secondary | ICD-10-CM | POA: Diagnosis not present

## 2017-01-27 DIAGNOSIS — E876 Hypokalemia: Secondary | ICD-10-CM | POA: Diagnosis not present

## 2017-01-27 DIAGNOSIS — I110 Hypertensive heart disease with heart failure: Secondary | ICD-10-CM | POA: Diagnosis not present

## 2017-01-27 DIAGNOSIS — I480 Paroxysmal atrial fibrillation: Secondary | ICD-10-CM | POA: Diagnosis not present

## 2017-01-27 DIAGNOSIS — T451X5S Adverse effect of antineoplastic and immunosuppressive drugs, sequela: Secondary | ICD-10-CM | POA: Diagnosis not present

## 2017-01-27 DIAGNOSIS — I509 Heart failure, unspecified: Secondary | ICD-10-CM | POA: Insufficient documentation

## 2017-01-27 DIAGNOSIS — E785 Hyperlipidemia, unspecified: Secondary | ICD-10-CM | POA: Diagnosis not present

## 2017-01-27 DIAGNOSIS — E1143 Type 2 diabetes mellitus with diabetic autonomic (poly)neuropathy: Secondary | ICD-10-CM | POA: Diagnosis not present

## 2017-01-27 DIAGNOSIS — I472 Ventricular tachycardia: Secondary | ICD-10-CM | POA: Diagnosis not present

## 2017-01-27 DIAGNOSIS — E032 Hypothyroidism due to medicaments and other exogenous substances: Secondary | ICD-10-CM | POA: Diagnosis not present

## 2017-01-27 DIAGNOSIS — Z515 Encounter for palliative care: Secondary | ICD-10-CM | POA: Diagnosis not present

## 2017-01-27 DIAGNOSIS — I427 Cardiomyopathy due to drug and external agent: Secondary | ICD-10-CM | POA: Diagnosis not present

## 2017-01-27 DIAGNOSIS — I4892 Unspecified atrial flutter: Secondary | ICD-10-CM | POA: Diagnosis not present

## 2017-01-27 DIAGNOSIS — J449 Chronic obstructive pulmonary disease, unspecified: Secondary | ICD-10-CM

## 2017-01-27 DIAGNOSIS — G2581 Restless legs syndrome: Secondary | ICD-10-CM | POA: Diagnosis not present

## 2017-01-27 DIAGNOSIS — Z95810 Presence of automatic (implantable) cardiac defibrillator: Secondary | ICD-10-CM | POA: Diagnosis not present

## 2017-01-27 DIAGNOSIS — G4733 Obstructive sleep apnea (adult) (pediatric): Secondary | ICD-10-CM | POA: Diagnosis not present

## 2017-01-27 DIAGNOSIS — I5023 Acute on chronic systolic (congestive) heart failure: Secondary | ICD-10-CM | POA: Diagnosis not present

## 2017-01-27 DIAGNOSIS — K3184 Gastroparesis: Secondary | ICD-10-CM | POA: Diagnosis not present

## 2017-01-27 DIAGNOSIS — K219 Gastro-esophageal reflux disease without esophagitis: Secondary | ICD-10-CM | POA: Diagnosis not present

## 2017-01-27 DIAGNOSIS — Z923 Personal history of irradiation: Secondary | ICD-10-CM | POA: Diagnosis not present

## 2017-01-27 LAB — PULMONARY FUNCTION TEST
DL/VA % PRED: 69 %
DL/VA: 3.42 ml/min/mmHg/L
DLCO UNC % PRED: 43 %
DLCO unc: 11.13 ml/min/mmHg
FEF 25-75 Post: 1.74 L/sec
FEF 25-75 Pre: 1.29 L/sec
FEF2575-%CHANGE-POST: 34 %
FEF2575-%PRED-POST: 74 %
FEF2575-%Pred-Pre: 55 %
FEV1-%CHANGE-POST: 5 %
FEV1-%Pred-Post: 63 %
FEV1-%Pred-Pre: 59 %
FEV1-POST: 1.65 L
FEV1-Pre: 1.56 L
FEV1FVC-%Change-Post: 2 %
FEV1FVC-%PRED-PRE: 99 %
FEV6-%Change-Post: 3 %
FEV6-%Pred-Post: 63 %
FEV6-%Pred-Pre: 61 %
FEV6-Post: 2.07 L
FEV6-Pre: 2 L
FEV6FVC-%Pred-Post: 104 %
FEV6FVC-%Pred-Pre: 104 %
FVC-%CHANGE-POST: 2 %
FVC-%PRED-POST: 61 %
FVC-%PRED-PRE: 59 %
FVC-POST: 2.07 L
FVC-PRE: 2.01 L
POST FEV1/FVC RATIO: 80 %
PRE FEV6/FVC RATIO: 100 %
Post FEV6/FVC ratio: 100 %
Pre FEV1/FVC ratio: 78 %
RV % PRED: 89 %
RV: 1.86 L
TLC % pred: 76 %
TLC: 3.97 L

## 2017-01-27 MED ORDER — ALBUTEROL SULFATE (2.5 MG/3ML) 0.083% IN NEBU
2.5000 mg | INHALATION_SOLUTION | Freq: Once | RESPIRATORY_TRACT | Status: AC
  Administered 2017-01-27: 2.5 mg via RESPIRATORY_TRACT

## 2017-01-27 NOTE — Telephone Encounter (Signed)
Corlanor PA approved by [employer] through 08/04/17.   Ruta Hinds. Velva Harman, PharmD, BCPS, CPP Clinical Pharmacist Pager: [phone] Phone: [phone] 01/27/2017 2:48 PM

## 2017-01-28 ENCOUNTER — Encounter (HOSPITAL_COMMUNITY)
Admission: AD | Disposition: A | Discharge status: Home Health | Payer: Self-pay | Source: Ambulatory Visit | Attending: Internal Medicine

## 2017-01-28 ENCOUNTER — Inpatient Hospital Stay (HOSPITAL_COMMUNITY)
Admission: AD | Admit: 2017-01-28 | Discharge: 2017-02-07 | DRG: 286 | Disposition: A | Discharge status: Home Health | Payer: Medicare Other | Source: Ambulatory Visit | Attending: Internal Medicine | Admitting: Internal Medicine

## 2017-01-28 ENCOUNTER — Encounter: Payer: Medicare Other | Admitting: Cardiothoracic Surgery

## 2017-01-28 ENCOUNTER — Encounter (HOSPITAL_COMMUNITY): Payer: Self-pay | Admitting: Internal Medicine

## 2017-01-28 DIAGNOSIS — R570 Cardiogenic shock: Secondary | ICD-10-CM | POA: Diagnosis not present

## 2017-01-28 DIAGNOSIS — E669 Obesity, unspecified: Secondary | ICD-10-CM | POA: Diagnosis present

## 2017-01-28 DIAGNOSIS — Z515 Encounter for palliative care: Secondary | ICD-10-CM

## 2017-01-28 DIAGNOSIS — I480 Paroxysmal atrial fibrillation: Secondary | ICD-10-CM | POA: Diagnosis present

## 2017-01-28 DIAGNOSIS — Z8719 Personal history of other diseases of the digestive system: Secondary | ICD-10-CM

## 2017-01-28 DIAGNOSIS — I472 Ventricular tachycardia: Secondary | ICD-10-CM | POA: Diagnosis present

## 2017-01-28 DIAGNOSIS — Z7951 Long term (current) use of inhaled steroids: Secondary | ICD-10-CM

## 2017-01-28 DIAGNOSIS — I252 Old myocardial infarction: Secondary | ICD-10-CM

## 2017-01-28 DIAGNOSIS — Z7189 Other specified counseling: Secondary | ICD-10-CM

## 2017-01-28 DIAGNOSIS — K3184 Gastroparesis: Secondary | ICD-10-CM | POA: Diagnosis present

## 2017-01-28 DIAGNOSIS — Z452 Encounter for adjustment and management of vascular access device: Secondary | ICD-10-CM | POA: Diagnosis not present

## 2017-01-28 DIAGNOSIS — G4733 Obstructive sleep apnea (adult) (pediatric): Secondary | ICD-10-CM | POA: Diagnosis not present

## 2017-01-28 DIAGNOSIS — K219 Gastro-esophageal reflux disease without esophagitis: Secondary | ICD-10-CM | POA: Diagnosis present

## 2017-01-28 DIAGNOSIS — I5023 Acute on chronic systolic (congestive) heart failure: Secondary | ICD-10-CM | POA: Diagnosis not present

## 2017-01-28 DIAGNOSIS — F329 Major depressive disorder, single episode, unspecified: Secondary | ICD-10-CM | POA: Diagnosis present

## 2017-01-28 DIAGNOSIS — I481 Persistent atrial fibrillation: Secondary | ICD-10-CM | POA: Diagnosis present

## 2017-01-28 DIAGNOSIS — T451X5S Adverse effect of antineoplastic and immunosuppressive drugs, sequela: Secondary | ICD-10-CM

## 2017-01-28 DIAGNOSIS — J449 Chronic obstructive pulmonary disease, unspecified: Secondary | ICD-10-CM | POA: Diagnosis present

## 2017-01-28 DIAGNOSIS — I509 Heart failure, unspecified: Secondary | ICD-10-CM

## 2017-01-28 DIAGNOSIS — E032 Hypothyroidism due to medicaments and other exogenous substances: Secondary | ICD-10-CM | POA: Diagnosis present

## 2017-01-28 DIAGNOSIS — Z86010 Personal history of colonic polyps: Secondary | ICD-10-CM

## 2017-01-28 DIAGNOSIS — I10 Essential (primary) hypertension: Secondary | ICD-10-CM | POA: Diagnosis not present

## 2017-01-28 DIAGNOSIS — Z6834 Body mass index (BMI) 34.0-34.9, adult: Secondary | ICD-10-CM

## 2017-01-28 DIAGNOSIS — Z794 Long term (current) use of insulin: Secondary | ICD-10-CM

## 2017-01-28 DIAGNOSIS — Z79899 Other long term (current) drug therapy: Secondary | ICD-10-CM

## 2017-01-28 DIAGNOSIS — Z886 Allergy status to analgesic agent status: Secondary | ICD-10-CM

## 2017-01-28 DIAGNOSIS — I427 Cardiomyopathy due to drug and external agent: Secondary | ICD-10-CM | POA: Diagnosis present

## 2017-01-28 DIAGNOSIS — Z95810 Presence of automatic (implantable) cardiac defibrillator: Secondary | ICD-10-CM | POA: Diagnosis not present

## 2017-01-28 DIAGNOSIS — G2581 Restless legs syndrome: Secondary | ICD-10-CM | POA: Diagnosis present

## 2017-01-28 DIAGNOSIS — I4892 Unspecified atrial flutter: Secondary | ICD-10-CM | POA: Diagnosis present

## 2017-01-28 DIAGNOSIS — E876 Hypokalemia: Secondary | ICD-10-CM | POA: Diagnosis not present

## 2017-01-28 DIAGNOSIS — I4891 Unspecified atrial fibrillation: Secondary | ICD-10-CM | POA: Diagnosis not present

## 2017-01-28 DIAGNOSIS — Z90710 Acquired absence of both cervix and uterus: Secondary | ICD-10-CM

## 2017-01-28 DIAGNOSIS — I2510 Atherosclerotic heart disease of native coronary artery without angina pectoris: Secondary | ICD-10-CM | POA: Diagnosis present

## 2017-01-28 DIAGNOSIS — Z923 Personal history of irradiation: Secondary | ICD-10-CM

## 2017-01-28 DIAGNOSIS — Z882 Allergy status to sulfonamides status: Secondary | ICD-10-CM

## 2017-01-28 DIAGNOSIS — Z91048 Other nonmedicinal substance allergy status: Secondary | ICD-10-CM

## 2017-01-28 DIAGNOSIS — Z888 Allergy status to other drugs, medicaments and biological substances status: Secondary | ICD-10-CM

## 2017-01-28 DIAGNOSIS — E785 Hyperlipidemia, unspecified: Secondary | ICD-10-CM | POA: Diagnosis present

## 2017-01-28 DIAGNOSIS — F419 Anxiety disorder, unspecified: Secondary | ICD-10-CM | POA: Diagnosis present

## 2017-01-28 DIAGNOSIS — I110 Hypertensive heart disease with heart failure: Secondary | ICD-10-CM | POA: Diagnosis not present

## 2017-01-28 DIAGNOSIS — Z881 Allergy status to other antibiotic agents status: Secondary | ICD-10-CM

## 2017-01-28 DIAGNOSIS — I5082 Biventricular heart failure: Secondary | ICD-10-CM | POA: Diagnosis present

## 2017-01-28 DIAGNOSIS — E1143 Type 2 diabetes mellitus with diabetic autonomic (poly)neuropathy: Secondary | ICD-10-CM | POA: Diagnosis present

## 2017-01-28 DIAGNOSIS — R0609 Other forms of dyspnea: Secondary | ICD-10-CM | POA: Diagnosis present

## 2017-01-28 DIAGNOSIS — I428 Other cardiomyopathies: Secondary | ICD-10-CM | POA: Diagnosis present

## 2017-01-28 DIAGNOSIS — Z8249 Family history of ischemic heart disease and other diseases of the circulatory system: Secondary | ICD-10-CM

## 2017-01-28 DIAGNOSIS — Z7901 Long term (current) use of anticoagulants: Secondary | ICD-10-CM

## 2017-01-28 DIAGNOSIS — K589 Irritable bowel syndrome without diarrhea: Secondary | ICD-10-CM | POA: Diagnosis present

## 2017-01-28 DIAGNOSIS — Z91030 Bee allergy status: Secondary | ICD-10-CM

## 2017-01-28 DIAGNOSIS — G43909 Migraine, unspecified, not intractable, without status migrainosus: Secondary | ICD-10-CM | POA: Diagnosis not present

## 2017-01-28 DIAGNOSIS — Z87891 Personal history of nicotine dependence: Secondary | ICD-10-CM

## 2017-01-28 DIAGNOSIS — Z9011 Acquired absence of right breast and nipple: Secondary | ICD-10-CM

## 2017-01-28 DIAGNOSIS — E1040 Type 1 diabetes mellitus with diabetic neuropathy, unspecified: Secondary | ICD-10-CM

## 2017-01-28 DIAGNOSIS — R918 Other nonspecific abnormal finding of lung field: Secondary | ICD-10-CM | POA: Diagnosis present

## 2017-01-28 DIAGNOSIS — Z853 Personal history of malignant neoplasm of breast: Secondary | ICD-10-CM

## 2017-01-28 DIAGNOSIS — Z9049 Acquired absence of other specified parts of digestive tract: Secondary | ICD-10-CM

## 2017-01-28 HISTORY — PX: RIGHT/LEFT HEART CATH AND CORONARY ANGIOGRAPHY: CATH118266

## 2017-01-28 HISTORY — DX: Dyspnea, unspecified: R06.00

## 2017-01-28 LAB — LIPID PANEL
Cholesterol: 83 mg/dL (ref 0–200)
HDL: 25 mg/dL — ABNORMAL LOW (ref >40)
LDL CALC: 46 mg/dL (ref 0–99)
Total CHOL/HDL Ratio: 3.3 RATIO
Triglycerides: 58 mg/dL (ref <150)
VLDL: 12 mg/dL (ref 0–40)

## 2017-01-28 LAB — URIC ACID: URIC ACID, SERUM: 8.2 mg/dL — AB (ref 2.3–6.6)

## 2017-01-28 LAB — HEPATIC FUNCTION PANEL
ALK PHOS: 83 U/L (ref 38–126)
ALT: 18 U/L (ref 14–54)
AST: 21 U/L (ref 15–41)
Albumin: 3.3 g/dL — ABNORMAL LOW (ref 3.5–5.0)
BILIRUBIN INDIRECT: 1 mg/dL — AB (ref 0.3–0.9)
BILIRUBIN TOTAL: 1.4 mg/dL — AB (ref 0.3–1.2)
Bilirubin, Direct: 0.4 mg/dL (ref 0.1–0.5)
TOTAL PROTEIN: 6.9 g/dL (ref 6.5–8.1)

## 2017-01-28 LAB — POCT I-STAT 3, VENOUS BLOOD GAS (G3P V)
ACID-BASE EXCESS: 2 mmol/L (ref 0.0–2.0)
Acid-Base Excess: 2 mmol/L (ref 0.0–2.0)
BICARBONATE: 27.8 mmol/L (ref 20.0–28.0)
BICARBONATE: 27.8 mmol/L (ref 20.0–28.0)
O2 Saturation: 52 %
O2 Saturation: 54 %
PCO2 VEN: 47.6 mmHg (ref 44.0–60.0)
PO2 VEN: 30 mmHg — AB (ref 32.0–45.0)
TCO2: 29 mmol/L (ref 0–100)
TCO2: 29 mmol/L (ref 0–100)
pCO2, Ven: 48.4 mmHg (ref 44.0–60.0)
pH, Ven: 7.368 (ref 7.250–7.430)
pH, Ven: 7.375 (ref 7.250–7.430)
pO2, Ven: 29 mmHg — CL (ref 32.0–45.0)

## 2017-01-28 LAB — BASIC METABOLIC PANEL
Anion gap: 9 (ref 5–15)
BUN: 23 mg/dL — ABNORMAL HIGH (ref 6–20)
CALCIUM: 9.3 mg/dL (ref 8.9–10.3)
CO2: 26 mmol/L (ref 22–32)
CREATININE: 1.21 mg/dL — AB (ref 0.44–1.00)
Chloride: 102 mmol/L (ref 101–111)
GFR calc non Af Amer: 47 mL/min — ABNORMAL LOW (ref >60)
GFR, EST AFRICAN AMERICAN: 55 mL/min — AB (ref >60)
Glucose, Bld: 230 mg/dL — ABNORMAL HIGH (ref 65–99)
Potassium: 3.6 mmol/L (ref 3.5–5.1)
Sodium: 137 mmol/L (ref 135–145)

## 2017-01-28 LAB — GLUCOSE, CAPILLARY
Glucose-Capillary: 238 mg/dL — ABNORMAL HIGH (ref 65–99)
Glucose-Capillary: 199 mg/dL — ABNORMAL HIGH (ref 65–99)
Glucose-Capillary: 185 mg/dL — ABNORMAL HIGH (ref 65–99)
Glucose-Capillary: 160 mg/dL — ABNORMAL HIGH (ref 65–99)

## 2017-01-28 LAB — POCT I-STAT 3, ART BLOOD GAS (G3+)
ACID-BASE EXCESS: 4 mmol/L — AB (ref 0.0–2.0)
BICARBONATE: 28.4 mmol/L — AB (ref 20.0–28.0)
O2 SAT: 100 %
PCO2 ART: 42.1 mmHg (ref 32.0–48.0)
PO2 ART: 194 mmHg — AB (ref 83.0–108.0)
TCO2: 30 mmol/L (ref 0–100)
pH, Arterial: 7.437 (ref 7.350–7.450)

## 2017-01-28 LAB — CBC
HCT: 42 % (ref 36.0–46.0)
Hemoglobin: 13.4 g/dL (ref 12.0–15.0)
MCH: 29.4 pg (ref 26.0–34.0)
MCHC: 31.9 g/dL (ref 30.0–36.0)
MCV: 92.1 fL (ref 78.0–100.0)
PLATELETS: 268 10*3/uL (ref 150–400)
RBC: 4.56 MIL/uL (ref 3.87–5.11)
RDW: 17.4 % — ABNORMAL HIGH (ref 11.5–15.5)
WBC: 6.9 10*3/uL (ref 4.0–10.5)

## 2017-01-28 LAB — POCT ACTIVATED CLOTTING TIME: Activated Clotting Time: 158 seconds

## 2017-01-28 LAB — PROTIME-INR
INR: 1.23
Prothrombin Time: 15.6 seconds — ABNORMAL HIGH (ref 11.4–15.2)

## 2017-01-28 LAB — ANTITHROMBIN III: AntiThromb III Func: 100 % (ref 75–120)

## 2017-01-28 LAB — HIV ANTIBODY (ROUTINE TESTING W REFLEX): HIV SCREEN 4TH GENERATION: NONREACTIVE

## 2017-01-28 LAB — LACTATE DEHYDROGENASE: LDH: 155 U/L (ref 98–192)

## 2017-01-28 LAB — MAGNESIUM: Magnesium: 1.6 mg/dL — ABNORMAL LOW (ref 1.7–2.4)

## 2017-01-28 LAB — PREALBUMIN: Prealbumin: 17.8 mg/dL — ABNORMAL LOW (ref 18–38)

## 2017-01-28 SURGERY — RIGHT/LEFT HEART CATH AND CORONARY ANGIOGRAPHY
Anesthesia: LOCAL

## 2017-01-28 MED ORDER — DULOXETINE HCL 30 MG PO CPEP
30.0000 mg | ORAL_CAPSULE | Freq: Every day | ORAL | Status: DC
  Administered 2017-01-29 – 2017-02-07 (×10): 30 mg via ORAL
  Filled 2017-01-28 (×10): qty 1

## 2017-01-28 MED ORDER — ACETAMINOPHEN 325 MG PO TABS: 650.0000 mg | ORAL_TABLET | ORAL | Status: DC | PRN

## 2017-01-28 MED ORDER — HEPARIN (PORCINE) IN NACL 2-0.9 UNIT/ML-% IJ SOLN
INTRAMUSCULAR | Status: AC | PRN
Start: 2017-01-28 — End: 2017-01-28
  Administered 2017-01-28: 1000 mL

## 2017-01-28 MED ORDER — FUROSEMIDE 10 MG/ML IJ SOLN
80.0000 mg | Freq: Two times a day (BID) | INTRAMUSCULAR | Status: DC
  Administered 2017-01-28 – 2017-01-30 (×5): 80 mg via INTRAVENOUS
  Filled 2017-01-28 (×5): qty 8

## 2017-01-28 MED ORDER — SODIUM CHLORIDE 0.9 % IV SOLN: 250.0000 mL | INTRAVENOUS | Status: DC | PRN

## 2017-01-28 MED ORDER — ONDANSETRON HCL 4 MG/2ML IJ SOLN: 4.0000 mg | Freq: Four times a day (QID) | INTRAMUSCULAR | Status: DC | PRN

## 2017-01-28 MED ORDER — SODIUM CHLORIDE 0.9% FLUSH
3.0000 mL | Freq: Two times a day (BID) | INTRAVENOUS | Status: DC
  Administered 2017-01-30 – 2017-01-31 (×3): 3 mL via INTRAVENOUS
  Administered 2017-02-02: 10 mL via INTRAVENOUS
  Administered 2017-02-07: 3 mL via INTRAVENOUS

## 2017-01-28 MED ORDER — IOPAMIDOL (ISOVUE-370) INJECTION 76%
INTRAVENOUS | Status: AC
  Filled 2017-01-28: qty 100

## 2017-01-28 MED ORDER — APIXABAN 5 MG PO TABS
5.0000 mg | ORAL_TABLET | Freq: Two times a day (BID) | ORAL | Status: DC
  Filled 2017-01-28: qty 1

## 2017-01-28 MED ORDER — LIDOCAINE HCL (PF) 1 % IJ SOLN
INTRAMUSCULAR | Status: DC | PRN
  Administered 2017-01-28: 2 mL via SUBCUTANEOUS
  Administered 2017-01-28: 15 mL via SUBCUTANEOUS

## 2017-01-28 MED ORDER — LEVOTHYROXINE SODIUM 75 MCG PO TABS
75.0000 ug | ORAL_TABLET | Freq: Every day | ORAL | Status: DC
  Administered 2017-01-29 – 2017-02-07 (×10): 75 ug via ORAL
  Filled 2017-01-28 (×10): qty 1

## 2017-01-28 MED ORDER — AMIODARONE HCL 200 MG PO TABS
200.0000 mg | ORAL_TABLET | Freq: Every day | ORAL | Status: DC
  Administered 2017-01-29 – 2017-01-30 (×2): 200 mg via ORAL
  Filled 2017-01-28 (×2): qty 1

## 2017-01-28 MED ORDER — SODIUM CHLORIDE 0.9% FLUSH: 3.0000 mL | INTRAVENOUS | Status: DC | PRN

## 2017-01-28 MED ORDER — ACETAMINOPHEN 325 MG PO TABS
650.0000 mg | ORAL_TABLET | ORAL | Status: DC | PRN
  Administered 2017-01-28 – 2017-02-05 (×4): 650 mg via ORAL
  Filled 2017-01-28 (×5): qty 2

## 2017-01-28 MED ORDER — ROPINIROLE HCL 1 MG PO TABS
1.0000 mg | ORAL_TABLET | Freq: Every evening | ORAL | Status: DC | PRN
  Administered 2017-01-28 – 2017-02-06 (×11): 1 mg via ORAL
  Filled 2017-01-28 (×12): qty 1

## 2017-01-28 MED ORDER — INSULIN ASPART 100 UNIT/ML ~~LOC~~ SOLN
0.0000 [IU] | Freq: Three times a day (TID) | SUBCUTANEOUS | Status: DC
  Administered 2017-01-28 – 2017-01-29 (×2): 3 [IU] via SUBCUTANEOUS
  Administered 2017-01-30: 2 [IU] via SUBCUTANEOUS
  Administered 2017-01-30: 3 [IU] via SUBCUTANEOUS
  Administered 2017-01-30: 2 [IU] via SUBCUTANEOUS
  Administered 2017-01-31: 3 [IU] via SUBCUTANEOUS
  Administered 2017-01-31: 11 [IU] via SUBCUTANEOUS
  Administered 2017-01-31 – 2017-02-01 (×2): 3 [IU] via SUBCUTANEOUS
  Administered 2017-02-02: 5 [IU] via SUBCUTANEOUS
  Administered 2017-02-03: 3 [IU] via SUBCUTANEOUS
  Administered 2017-02-03: 5 [IU] via SUBCUTANEOUS
  Administered 2017-02-03: 3 [IU] via SUBCUTANEOUS
  Administered 2017-02-04: 5 [IU] via SUBCUTANEOUS
  Administered 2017-02-04: 3 [IU] via SUBCUTANEOUS
  Administered 2017-02-05: 2 [IU] via SUBCUTANEOUS
  Administered 2017-02-05: 5 [IU] via SUBCUTANEOUS
  Administered 2017-02-05: 2 [IU] via SUBCUTANEOUS
  Administered 2017-02-06: 15 [IU] via SUBCUTANEOUS
  Administered 2017-02-07 (×2): 11 [IU] via SUBCUTANEOUS

## 2017-01-28 MED ORDER — BUSPIRONE HCL 10 MG PO TABS
30.0000 mg | ORAL_TABLET | Freq: Two times a day (BID) | ORAL | Status: DC
  Administered 2017-01-28 – 2017-02-07 (×20): 30 mg via ORAL
  Filled 2017-01-28: qty 6
  Filled 2017-01-28 (×12): qty 3
  Filled 2017-01-28: qty 6
  Filled 2017-01-28 (×5): qty 3
  Filled 2017-01-28: qty 6

## 2017-01-28 MED ORDER — MIDAZOLAM HCL 2 MG/2ML IJ SOLN
INTRAMUSCULAR | Status: AC
  Filled 2017-01-28: qty 2

## 2017-01-28 MED ORDER — ALBUTEROL SULFATE (2.5 MG/3ML) 0.083% IN NEBU: 2.5000 mg | INHALATION_SOLUTION | Freq: Four times a day (QID) | RESPIRATORY_TRACT | Status: DC | PRN

## 2017-01-28 MED ORDER — SODIUM CHLORIDE 0.9% FLUSH
3.0000 mL | Freq: Two times a day (BID) | INTRAVENOUS | Status: DC
  Administered 2017-01-30 – 2017-01-31 (×2): 3 mL via INTRAVENOUS
  Administered 2017-02-02: 10 mL via INTRAVENOUS
  Administered 2017-02-03 – 2017-02-04 (×2): 3 mL via INTRAVENOUS

## 2017-01-28 MED ORDER — TRAMADOL HCL 50 MG PO TABS
50.0000 mg | ORAL_TABLET | Freq: Every day | ORAL | Status: DC | PRN
Start: 2017-01-28 — End: 2017-02-07
  Administered 2017-02-07: 50 mg via ORAL
  Filled 2017-01-28: qty 1

## 2017-01-28 MED ORDER — VERAPAMIL HCL 2.5 MG/ML IV SOLN
INTRAVENOUS | Status: AC
  Filled 2017-01-28: qty 2

## 2017-01-28 MED ORDER — PANTOPRAZOLE SODIUM 40 MG PO TBEC
40.0000 mg | DELAYED_RELEASE_TABLET | Freq: Two times a day (BID) | ORAL | Status: DC
Start: 2017-01-28 — End: 2017-02-07
  Administered 2017-01-28 – 2017-02-07 (×20): 40 mg via ORAL
  Filled 2017-01-28 (×20): qty 1

## 2017-01-28 MED ORDER — APIXABAN 5 MG PO TABS: 5.0000 mg | ORAL_TABLET | Freq: Two times a day (BID) | ORAL | Status: DC

## 2017-01-28 MED ORDER — SODIUM CHLORIDE 0.9% FLUSH
3.0000 mL | INTRAVENOUS | Status: DC | PRN
Start: 2017-01-28 — End: 2017-02-07

## 2017-01-28 MED ORDER — DOCUSATE SODIUM 100 MG PO CAPS
300.0000 mg | ORAL_CAPSULE | Freq: Every day | ORAL | Status: DC
  Administered 2017-01-28 – 2017-02-06 (×7): 300 mg via ORAL
  Filled 2017-01-28 (×10): qty 3

## 2017-01-28 MED ORDER — HYDROXYZINE HCL 25 MG PO TABS
50.0000 mg | ORAL_TABLET | Freq: Four times a day (QID) | ORAL | Status: DC | PRN
  Administered 2017-02-07: 50 mg via ORAL
  Filled 2017-01-28: qty 2

## 2017-01-28 MED ORDER — SUMATRIPTAN SUCCINATE 100 MG PO TABS
100.0000 mg | ORAL_TABLET | Freq: Every day | ORAL | Status: DC | PRN
  Administered 2017-01-30 – 2017-02-05 (×5): 100 mg via ORAL
  Filled 2017-01-28 (×7): qty 1

## 2017-01-28 MED ORDER — SODIUM CHLORIDE 0.9 % IV SOLN
INTRAVENOUS | Status: DC
  Administered 2017-01-28: 07:00:00 via INTRAVENOUS

## 2017-01-28 MED ORDER — HEPARIN SODIUM (PORCINE) 1000 UNIT/ML IJ SOLN
INTRAMUSCULAR | Status: DC | PRN
  Administered 2017-01-28: 4500 [IU] via INTRAVENOUS

## 2017-01-28 MED ORDER — HEPARIN (PORCINE) IN NACL 2-0.9 UNIT/ML-% IJ SOLN
INTRAMUSCULAR | Status: AC
  Filled 2017-01-28: qty 1000

## 2017-01-28 MED ORDER — INSULIN ASPART PROT & ASPART (70-30 MIX) 100 UNIT/ML ~~LOC~~ SUSP
60.0000 [IU] | Freq: Two times a day (BID) | SUBCUTANEOUS | Status: DC
  Administered 2017-01-28 – 2017-02-07 (×17): 60 [IU] via SUBCUTANEOUS
  Filled 2017-01-28: qty 10

## 2017-01-28 MED ORDER — MIDAZOLAM HCL 2 MG/2ML IJ SOLN
INTRAMUSCULAR | Status: DC | PRN
  Administered 2017-01-28: 0.5 mg via INTRAVENOUS

## 2017-01-28 MED ORDER — IOPAMIDOL (ISOVUE-370) INJECTION 76%
INTRAVENOUS | Status: DC | PRN
  Administered 2017-01-28: 25 mL via INTRA_ARTERIAL

## 2017-01-28 MED ORDER — ONDANSETRON HCL 4 MG/2ML IJ SOLN
4.0000 mg | Freq: Four times a day (QID) | INTRAMUSCULAR | Status: DC | PRN
  Administered 2017-02-02 – 2017-02-07 (×2): 4 mg via INTRAVENOUS
  Filled 2017-01-28: qty 2

## 2017-01-28 MED ORDER — HEPARIN (PORCINE) IN NACL 2-0.9 UNIT/ML-% IJ SOLN
INTRAMUSCULAR | Status: DC | PRN
  Administered 2017-01-28: 10 mL via INTRA_ARTERIAL

## 2017-01-28 MED ORDER — LIDOCAINE HCL (PF) 1 % IJ SOLN
INTRAMUSCULAR | Status: AC
  Filled 2017-01-28: qty 30

## 2017-01-28 MED ORDER — SODIUM CHLORIDE 0.9% FLUSH
3.0000 mL | Freq: Two times a day (BID) | INTRAVENOUS | Status: DC
  Administered 2017-01-30 – 2017-02-02 (×5): 3 mL via INTRAVENOUS

## 2017-01-28 MED ORDER — TIOTROPIUM BROMIDE MONOHYDRATE 18 MCG IN CAPS
18.0000 ug | ORAL_CAPSULE | Freq: Every day | RESPIRATORY_TRACT | Status: DC
  Administered 2017-01-29 – 2017-02-07 (×8): 18 ug via RESPIRATORY_TRACT
  Filled 2017-01-28 (×3): qty 5

## 2017-01-28 MED ORDER — ALPRAZOLAM 0.25 MG PO TABS
0.2500 mg | ORAL_TABLET | Freq: Four times a day (QID) | ORAL | Status: DC | PRN
  Administered 2017-02-01 – 2017-02-06 (×3): 0.25 mg via ORAL
  Filled 2017-01-28 (×4): qty 1

## 2017-01-28 MED ORDER — ALBUTEROL SULFATE HFA 108 (90 BASE) MCG/ACT IN AERS: 2.0000 | INHALATION_SPRAY | Freq: Four times a day (QID) | RESPIRATORY_TRACT | Status: DC | PRN

## 2017-01-28 MED ORDER — DIGOXIN 125 MCG PO TABS
0.1250 mg | ORAL_TABLET | ORAL | Status: DC
  Administered 2017-01-30 – 2017-02-07 (×5): 0.125 mg via ORAL
  Filled 2017-01-28 (×5): qty 1

## 2017-01-28 MED ORDER — MILRINONE LACTATE IN DEXTROSE 20-5 MG/100ML-% IV SOLN
0.2500 ug/kg/min | INTRAVENOUS | Status: DC
  Administered 2017-01-28 – 2017-01-30 (×4): 0.25 ug/kg/min via INTRAVENOUS
  Filled 2017-01-28 (×6): qty 100

## 2017-01-28 MED ORDER — APIXABAN 5 MG PO TABS
5.0000 mg | ORAL_TABLET | Freq: Two times a day (BID) | ORAL | Status: DC
  Administered 2017-01-28 – 2017-02-03 (×12): 5 mg via ORAL
  Filled 2017-01-28 (×3): qty 1
  Filled 2017-01-28: qty 2
  Filled 2017-01-28 (×8): qty 1

## 2017-01-28 MED ORDER — SODIUM CHLORIDE 0.9 % IV SOLN
250.0000 mL | INTRAVENOUS | Status: DC | PRN
  Administered 2017-02-02: 14:00:00 via INTRAVENOUS

## 2017-01-28 MED ORDER — BACLOFEN 20 MG PO TABS: 20.0000 mg | ORAL_TABLET | Freq: Two times a day (BID) | ORAL | Status: DC | PRN

## 2017-01-28 MED ORDER — ROSUVASTATIN CALCIUM 10 MG PO TABS
10.0000 mg | ORAL_TABLET | Freq: Every day | ORAL | Status: DC
  Administered 2017-01-28 – 2017-02-06 (×10): 10 mg via ORAL
  Filled 2017-01-28 (×10): qty 1

## 2017-01-28 SURGICAL SUPPLY — 13 items
CATH 5FR JL3.5 JR4 ANG PIG MP (CATHETERS) ×1 IMPLANT
CATH SWAN GANZ 7F STRAIGHT (CATHETERS) ×1 IMPLANT
DEVICE RAD COMP TR BAND LRG (VASCULAR PRODUCTS) ×1 IMPLANT
GLIDESHEATH SLEND SS 6F .021 (SHEATH) ×1 IMPLANT
GUIDEWIRE INQWIRE 1.5J.035X260 (WIRE) IMPLANT
INQWIRE 1.5J .035X260CM (WIRE) ×2
KIT HEART LEFT (KITS) ×2 IMPLANT
PACK CARDIAC CATHETERIZATION (CUSTOM PROCEDURE TRAY) ×2 IMPLANT
SHEATH PINNACLE 7F 10CM (SHEATH) ×1 IMPLANT
TRANSDUCER W/STOPCOCK (MISCELLANEOUS) ×2 IMPLANT
TUBING CIL FLEX 10 FLL-RA (TUBING) ×2 IMPLANT
WIRE EMERALD 3MM-J .035X150CM (WIRE) IMPLANT
WIRE HI TORQ VERSACORE-J 145CM (WIRE) ×1 IMPLANT

## 2017-01-28 NOTE — H&P (Signed)
Advanced Heart Failure Team History and Physical Note   Primary Physician:  [name] NP Primary Cardiologist:  Dr Haroldine Laws Endocrinologist: Dr Loanne Drilling Oncologist: Dr Lindi Adie GI: Dr Havery Moros  Reason for Admission: Acute/Chronic Heart Failure   HPI:   [name] is a 61 year old with a history of R 2005/2008 and L 2017  breast cancer, HTN, DM, IBS, hyperlipidemia, chronic systolic heart failure, NICM chemo induced cardiomyopathy, St Jude ICD, and GI bleed S/B AVMs. R Breast cancer treatment with adriamycin in 2005. L breast cancer treatment with lumpectomy and radiation.   Lives alone in a camper. Brother lives in her trailer beside the camper. Brother has biploar disorder and schizophrenia. Sister lives in [place] and can help her if needed. Does not drink alcohol or smoke. No recent bleeding issues. Takes all medications.    She has been followed closely in the HF clinic and has had functional decline over the last few months. CPX was completed in May and showed increased slope and decreased VO2 from CPX in 2017. Reports increasd fatigue and dyspnea with exertion. Denies bleeding problems. For this reason she was set up for RHC/LHC  today. Admitted from cath lab with elevated filling pressures and reduced cardiac output.   RHC/LHC 01/28/2017 Ao = 96/64 (76) LV = 96/37 RA =  20 RV =  54/21 PA =  59/30 (39) PCW = 33 Fick cardiac output/index = 3.1/1.6 PVR = 1.9 WU FA sat = 100% PA sat = 52%, 54% RA/PCWP = 0.61 PAPi = 1.45 Assessment: 1. Minimal non-obstructive CAD 2. Severe NICM with EF 10-15% 3. Elevated filling pressures with biventricular failure and severely reduced cardiac output  CPX 12/23/16: FVC 2.15 (66%)    FEV1 1.57 (62%)     FEV1/FVC 73% (92%)     MVV 58 (62%)   Resting HR: 93 Peak HR: 116  (73% age predicted max HR) BP rest: 88/60 BP peak: 104/60 Peak VO2: 9.2 (52% predicted peak VO2) VE/VCO2 slope: 51 OUES: 1.07 Peak RER:  0.99 Ventilatory Threshold: 8.0 (45% predicted or measured peak VO2) VE/MVV: 71% O2pulse: 8  (73% predicted O2pulse)  GI Work UP  EGD 11/16/15 Dr. Havery Moros- Esophagogastric landmarks identified. Normal esophagus- Multiple gastric polyps, suspect benign fundic gland polyps.- Two duodenal polyps, could be adenomatous, not removed on this exam due to potential need for capsule study.o be removed at a later date. Overall, no clear pathology noted to cause th patient's melena and anemia. 11/17/15 Colonoscopy Dr. Hilarie Fredrickson - One 5 mm polyp (path tubular adenoma) in the proximal descending colon, removed with a cold snare. Resected and retrieved.- Moderate diverticulosis in the left colon- Melanosis in the colon.The distal rectum and anal verge are normal on retroflexion view. 12/2015 Capsule endoscopy- gastric polyps, duodenal polyps "there is a very small amount of blood oozing in lumen in the suspected duodenum but no identifiably actively bleeding source noted", "one tiny AVM at 4 hours, 29 minutes"....enteroscopy with polypectomy of the duodenal polyps is the likely next step in this evaluation.  02/2016 EGD- duodenal AVMs treated with APC.  03/2016 EGD- multiple polyps removed.    Review of Systems: [y] = yes, [ ]  = no   General: Weight gain [ ] ; Weight loss [ ] ; Anorexia [ ] ; Fatigue [ Y]; Fever [ ] ; Chills [ ] ; Weakness [Y ]  Cardiac: Chest pain/pressure [ ] ; Resting SOB [ ] ; Exertional SOB [ Y]; Orthopnea [ Y]; Pedal Edema [Y ]; Palpitations [ ] ; Syncope [ ] ;  Presyncope [ ] ; Paroxysmal nocturnal dyspnea[ ]   Pulmonary: Cough [ ] ; Wheezing[ ] ; Hemoptysis[ ] ; Sputum [ ] ; Snoring [ ]   GI: Vomiting[ ] ; Dysphagia[ ] ; Melena[ ] ; Hematochezia [ ] ; Heartburn[ ] ; Abdominal pain [ ] ; Constipation [ ] ; Diarrhea [ ] ; BRBPR [ ]   GU: Hematuria[ ] ; Dysuria [ ] ; Nocturia[ ]   Vascular: Pain in legs with walking [ ] ; Pain in feet with lying flat [ ] ; Non-healing sores [ ] ; Stroke [ ] ; TIA [ ] ; Slurred speech  [ ] ;  Neuro: Headaches[ ] ; Vertigo[ ] ; Seizures[ ] ; Paresthesias[ ] ;Blurred vision [ ] ; Diplopia [ ] ; Vision changes [ ]   Ortho/Skin: Arthritis [ ] ; Joint pain [Y ]; Muscle pain [ ] ; Joint swelling [ ] ; Back Pain [Y ]; Rash [ ]   Psych: Depression[ ] ; Anxiety[ ]   Heme: Bleeding problems [ ] ; Clotting disorders [ ] ; Anemia [ ]   Endocrine: Diabetes [ Y]; Thyroid dysfunction[Y ]   Home Medications Prior to Admission medications   Medication Sig Start Date End Date Taking? Authorizing Provider  acetaminophen (TYLENOL) 650 MG CR tablet Take 1,300 mg by mouth every 8 (eight) hours as needed for pain.   Yes [provider]  amiodarone (PACERONE) 200 MG tablet Take 1 tablet (200 mg total) by mouth daily. 07/11/16  Yes Butch Otterson, Shaune Pascal, MD  apixaban (ELIQUIS) 5 MG TABS tablet Take 1 tablet (5 mg total) by mouth 2 (two) times daily. 07/11/16  Yes Comer Devins, Shaune Pascal, MD  busPIRone (BUSPAR) 30 MG tablet Take 30 mg by mouth 2 (two) times daily.    Yes [provider]  Calcium Carb-Cholecalciferol (CALCIUM 600+D3) 600-800 MG-UNIT TABS Take by mouth daily.   Yes [provider]  carvedilol (COREG) 6.25 MG tablet Take 1 tablet (6.25 mg total) by mouth 2 (two) times daily with a meal. 07/11/16  Yes Korinne Greenstein, Shaune Pascal, MD  Cholecalciferol (VITAMIN D) 2000 units CAPS Take 1 capsule by mouth daily.   Yes [provider]  digoxin (LANOXIN) 0.125 MG tablet Take 1 tablet (0.125 mg total) by mouth every other day. 07/11/16  Yes Ygnacio Fecteau, Shaune Pascal, MD  docusate sodium (COLACE) 100 MG capsule Take 3 capsules (300 mg total) by mouth at bedtime. 10/03/15  Yes Clegg, Amy D, NP  DULoxetine (CYMBALTA) 30 MG capsule Take 30 mg by mouth daily.   Yes Moon, Amy A, NP  furosemide (LASIX) 20 MG tablet Take 20-40 mg by mouth as directed. 40mg  in the AM and 40mg  in the PM. May take an additional 20mg  in the PM for fluid.   Yes [provider]  Garlic 409 MG CAPS Take 500 mg by mouth daily  after lunch.    Yes [provider]  Glucos-Chondroit-Hyaluron-MSM (GLUCOSAMINE CHONDROITIN JOINT PO) Take 1 tablet by mouth daily.   Yes [provider]  HUMALOG KWIKPEN 100 UNIT/ML KiwkPen Inject 14-24 Units into the skin 3 (three) times daily. On average per patient 24 unit with breakfast, 16 units with lunch, & 14-16 units with supper 10/30/15  Yes [provider]  HUMALOG MIX 75/25 KWIKPEN (75-25) 100 UNIT/ML Kwikpen Inject 60 Units into the skin 2 (two) times daily.  11/21/15  Yes [provider]  hydrOXYzine (ATARAX/VISTARIL) 50 MG tablet Take 50 mg by mouth every 6 (six) hours as needed for itching ((primarily taken at bedtime)).    Yes [provider]  IRON PO Take 24 mg by mouth daily.   Yes [provider]  ivabradine (CORLANOR) 5 MG TABS  tablet Take 1.5 tablets (7.5 mg total) by mouth 2 (two) times daily with a meal. 01/20/17  Yes Jesus Poplin, Shaune Pascal, MD  levothyroxine (SYNTHROID, LEVOTHROID) 75 MCG tablet Take 75 mcg by mouth daily before breakfast.    Yes [provider]  Melatonin 1 MG TABS Take 0.5 mg by mouth at bedtime.   Yes [provider]  mometasone (NASONEX) 50 MCG/ACT nasal spray Place 1 spray into both nostrils daily. allergies   Yes [provider]  Multiple Minerals (CALCIUM-MAGNESIUM-ZINC) TABS Take 1 tablet by mouth daily after lunch.    Yes [provider]  nitroGLYCERIN (NITROSTAT) 0.4 MG SL tablet Place 1 tablet (0.4 mg total) under the tongue every 5 (five) minutes as needed. For chest pain 07/02/16  Yes Omnia Dollinger, Shaune Pascal, MD  Omega-3 Fatty Acids (FISH OIL) 1000 MG CAPS Take 2 capsules by mouth daily.   Yes [provider]  pantoprazole (PROTONIX) 40 MG tablet Take 40 mg by mouth 2 (two) times daily.     Yes [provider]  polyethylene glycol powder (GLYCOLAX/MIRALAX) powder Mix 17 grams in 8 oz of water twice daily 06/04/16  Yes Armbruster, Renelda Loma, MD   potassium chloride SA (K-DUR,KLOR-CON) 20 MEQ tablet Take 4 tablets (80 mEq total) by mouth 2 (two) times daily. Patient taking differently: Take 60-80 mEq by mouth See admin instructions. 60 MEQ IN T HE MORNING, AND 80 MEQ IN THE EVENING 12/11/16  Yes Yaminah Clayborn, Shaune Pascal, MD  Propylene Glycol (SYSTANE BALANCE) 0.6 % SOLN Place 1 drop into both eyes 3 (three) times daily.   Yes [provider]  pyridOXINE (VITAMIN B-6) 100 MG tablet Take 100 mg by mouth daily after lunch.    Yes [provider]  rOPINIRole (REQUIP) 1 MG tablet Take 1 mg by mouth at bedtime and may repeat dose one time if needed. IF RESTLESS LEGS PERSIST PATIENT MAY REPEAT ADDITIONAL DOSE   Yes [provider]  rosuvastatin (CRESTOR) 10 MG tablet Take 10 mg by mouth at bedtime. 06/18/16  Yes [provider]  sacubitril-valsartan (ENTRESTO) 49-51 MG Take 1 tablet by mouth 2 (two) times daily. 12/09/16  Yes Aliciana Ricciardi, Shaune Pascal, MD  Simethicone Extra Strength 125 MG CAPS Take 1 tablet by mouth daily as needed (for bloating). Reported on 02/23/2016   Yes [provider]  spironolactone (ALDACTONE) 25 MG tablet Take 1 tablet (25 mg total) by mouth daily. 07/11/16  Yes Mckinzi Eriksen, Shaune Pascal, MD  tiotropium (SPIRIVA) 18 MCG inhalation capsule Place 18 mcg into inhaler and inhale daily.     Yes [provider]  vitamin B-12 (CYANOCOBALAMIN) 1000 MCG tablet Take 1,000 mcg by mouth daily after lunch.    Yes [provider]  vitamin C (ASCORBIC ACID) 500 MG tablet Take 500 mg by mouth daily after lunch.    Yes [provider]  vitamin E 400 UNIT capsule Take 400 Units by mouth daily after lunch.    Yes [provider]  albuterol (PROVENTIL HFA) 108 (90 BASE) MCG/ACT inhaler Inhale 2 puffs into the lungs every 6 (six) hours as needed. For shortness of breath    [provider]  ALPRAZolam (XANAX) 0.5 MG tablet Take 0.25 mg by mouth 4 (four) times daily as needed for  anxiety.  11/06/15   [provider]  baclofen (LIORESAL) 20 MG tablet Take 20 mg by mouth 2 (two) times daily as needed for muscle spasms.    [provider]  metolazone (ZAROXOLYN)  2.5 MG tablet Take 1 tablet (2.5 mg total) by mouth as needed (for weight gain >3 lbs). 07/30/16   Colburn Asper, Shaune Pascal, MD  SUMAtriptan (IMITREX) 100 MG tablet Take 100 mg by mouth daily as needed for migraine.  11/28/15   [provider]  traMADol (ULTRAM) 50 MG tablet Take 1 tablet by mouth daily as needed for moderate pain.  10/15/16   [provider]    Past Medical History: Past Medical History:  Diagnosis Date  . AICD (automatic cardioverter/defibrillator) present    st jude  . Anxiety   . 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 Asthma   . Axillary mass, left 10/17/2016  . Breast cancer ([place]) 2005, 2017  . CHF (congestive heart failure) ([place])   . COPD (chronic obstructive pulmonary disease) ([place])   . Depression    followed by a psychiatrist  . DYSLIPIDEMIA 05/01/2009  . Dysrhythmia    atrial fibrillation  . Family history of breast cancer   . Family history of colon cancer   . Family history of uterine cancer   . Furuncle 10/17/2016  . Gastroparesis   . GERD (gastroesophageal reflux disease)   . Headache   . History of breast cancer 2006   with return in 2008, s/p mastectomy, XRT and chemotherapy  . History of external beam radiation therapy 09/25/16-11/13/16   left breast treated to 50.4 Gy in 28 fractions, boost 12 Gy in 6 fractions  . Hypertension   . 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 IBS (irritable bowel syndrome)   . ICD (implantable cardiac defibrillator) in place 11/10/2012   ICD (11/2012)  . Malignant neoplasm of lower-inner quadrant of left female breast ([place]) 06/26/2016  . Myocardial infarction ([place])   . OBESITY 07/24/2007  . OBSTRUCTIVE SLEEP APNEA 07/24/2007   with CPAP compliance  . Osteoarthritis   . Persistent atrial fibrillation ([place]) 09/2014  . Personal history  of radiation therapy    current  . Presence of permanent cardiac pacemaker   . RESTLESS LEG SYNDROME 07/24/2007  . SYSTOLIC HEART FAILURE, CHRONIC 01/05/2009   a. chemo induced cardiomyopathy b. cMRI (02/2009) EF 45% c. Myoview 05/2010: EF 59%, no ischemia d. RHC (01/2012) RA 20, RV 55/13/22, PA 56/34 (44), PCWP 28, Fick CO/CI: 3.1 / 1.5, PVR 5.3 WU, PA sat 42% & 48% e. ECHO (10/2012) EF 20-25%, diff HK, MV calcified annulus f. ECHO (03/2014) EF 30-35%, sev HK, inferior/inferoseptal walls, mild MR    Past Surgical History: Past Surgical History:  Procedure Laterality Date  . ABDOMINAL HYSTERECTOMY  1991  . BI-VENTRICULAR IMPLANTABLE CARDIOVERTER DEFIBRILLATOR N/A 11/10/2012   SJM Forify Assura single chamber ICD  . BREAST BIOPSY Left 08/2015   benign  . BREAST BIOPSY Left 06/2016   malignant  . BREAST LUMPECTOMY Left 07/24/2016   malignant  . BREAST LUMPECTOMY WITH RADIOACTIVE SEED AND SENTINEL LYMPH NODE BIOPSY Left 07/24/2016   Procedure: LEFT BREAST LUMPECTOMY WITH RADIOACTIVE SEED AND SENTINEL LYMPH NODE BIOPSY WITH BLUE DYE INJECTION;  Surgeon: Fanny Skates, MD;  Location: [facility];  Service: General;  Laterality: Left;  . CARDIAC CATHETERIZATION N/A 08/21/2015   Procedure: Right/Left Heart Cath and Coronary Angiography;  Surgeon: Jolaine Artist, MD;  Location: [facility];  Service: Cardiovascular;  Laterality: N/A;  . CARDIAC DEFIBRILLATOR PLACEMENT  11/10/2012   SJM Fortify Assura VR implanted by Dr Rayann Heman for primary prevention  . CHOLECYSTECTOMY    . COLONOSCOPY N/A 11/17/2015   Procedure: COLONOSCOPY;  Surgeon: Jerene Bears, MD;  Location:  [facility];  Service: Endoscopy;  Laterality: N/A;  . ENTEROSCOPY N/A 03/26/2016   Procedure: ENTEROSCOPY;  Surgeon: Manus Gunning, MD;  Location: [facility];  Service: Gastroenterology;  Laterality: N/A;  . ESOPHAGOGASTRODUODENOSCOPY N/A 11/16/2015   Procedure: ESOPHAGOGASTRODUODENOSCOPY (EGD);  Surgeon: Manus Gunning,  MD;  Location: [facility];  Service: Gastroenterology;  Laterality: N/A;  . ESOPHAGOGASTRODUODENOSCOPY N/A 02/24/2016   Procedure: ESOPHAGOGASTRODUODENOSCOPY (EGD);  Surgeon: Milus Banister, MD;  Location: [facility];  Service: Endoscopy;  Laterality: N/A;  . ESOPHAGOGASTRODUODENOSCOPY N/A 03/26/2016   Procedure: ESOPHAGOGASTRODUODENOSCOPY (EGD);  Surgeon: Manus Gunning, MD;  Location: [facility];  Service: Gastroenterology;  Laterality: N/A;  . EYE SURGERY     cataracts  . GIVENS CAPSULE STUDY N/A 12/11/2015   Procedure: GIVENS CAPSULE STUDY;  Surgeon: Manus Gunning, MD;  Location: [facility];  Service: Gastroenterology;  Laterality: N/A;  . KNEE CARTILAGE SURGERY    . MASTECTOMY     right  . WRIST SURGERY Bilateral     Family History: Family History  Problem Relation Age of Onset  . Diabetes Mellitus II Mother   . Lung cancer Mother   . Congestive Heart Failure Mother   . Coronary artery disease Father        CABG x 3  . Hypertension Father   . Diabetes Mellitus II Father   . Breast cancer Cousin 37       maternal first cousin  . Colon cancer Cousin 86       maternal first cousin  . Uterine cancer Cousin        dx in her 72s  . Colon cancer Maternal Aunt        dx in her late 65s  . Uterine cancer Maternal Aunt 66  . Lung cancer Maternal Aunt   . Uterine cancer Maternal Aunt 38  . Colon cancer Maternal Aunt 59  . Breast cancer Maternal Grandmother 52  . Uterine cancer Maternal Aunt 48  . Uterine cancer Cousin        dx in her 70s  . Uterine cancer Cousin 28    Social History: Social History   Social History  . Marital status: Divorced    Spouse name: N/A  . Number of children: 1  . Years of education: N/A   Occupational History  . Disabled Unemployed   Social History Main Topics  . Smoking status: Former Smoker    Packs/day: 1.50    Years: 33.00    Types: Cigarettes    Quit date: 04/12/2001  . Smokeless tobacco: Never Used      Comment: heaviest - 2.5- 3PPD x 3 years   . Alcohol use No  . Drug use: No  . Sexual activity: Not Currently   Other Topics Concern  . Not on file   Social History Narrative   Lives with mother and brother Philis Nettle          Allergies:  Allergies  Allergen Reactions  . Bee Venom Anaphylaxis  . Aspirin Hives  . Ciprofloxacin Hives  . Codeine Nausea Only    "can take ONLY if she eats with med"  . Naproxen Hives  . Other Other (See Comments)    ANY TYPES OF METAL-CAUSES BLISTERS SKIN AND ITCHING   . Sulfonamide Derivatives Hives  . Xarelto [Rivaroxaban] Hives  . Atorvastatin Itching  . Nsaids Itching and Rash  . Tape Rash    Also allergic to metal  . Tyler Aas Flextouch [Insulin Degludec] Itching and Rash  Objective:    Vital Signs:   Temp:  [98.4 F (36.9 C)] 98.4 F (36.9 C) (06/26 0552) Pulse Rate:  [81-99] 85 (06/26 0857) Resp:  [8-27] 19 (06/26 0857) BP: (95-114)/(57-75) 95/65 (06/26 0857) SpO2:  [0 %-100 %] 97 % (06/26 0857) Weight:  [211 lb 2 oz (95.8 kg)] 211 lb 2 oz (95.8 kg) (06/26 0552)   Filed Weights   01/28/17 0552  Weight: 211 lb 2 oz (95.8 kg)     Physical Exam     General:  Appears chronically ill. No respiratory difficulty HEENT: Normal Neck: Supple. JVP to jaw. Carotids 2+ bilat; no bruits. No lymphadenopathy or thyromegaly appreciated. Cor: PMI nondisplaced. Regular rate & rhythm. No rubs,or murmurs. + S3  Lungs: Clear Abdomen: obese.  No cyanosis, clubbing, rash, edema Neuro: Alert & oriented x 3, cranial nerves grossly intact. moves all 4 extremities w/o difficulty. Affect pleasant.   Telemetry  NSR 80s with PVCs. personally reviewed.    EKG   NA  Labs     Basic Metabolic Panel:  Recent Labs Lab 01/28/17 0631  NA 137  K 3.6  CL 102  CO2 26  GLUCOSE 230*  BUN 23*  CREATININE 1.21*  CALCIUM 9.3    Liver Function Tests: No results for input(s): AST, ALT, ALKPHOS, BILITOT, PROT, ALBUMIN in the last 168  hours. No results for input(s): LIPASE, AMYLASE in the last 168 hours. No results for input(s): AMMONIA in the last 168 hours.  CBC:  Recent Labs Lab 01/28/17 0631  WBC 6.9  HGB 13.4  HCT 42.0  MCV 92.1  PLT 268    Cardiac Enzymes: No results for input(s): CKTOTAL, CKMB, CKMBINDEX, TROPONINI in the last 168 hours.  BNP: BNP (last 3 results)  Recent Labs  07/23/16 1200 12/21/16 1054  BNP 182.4* 474.9*    ProBNP (last 3 results) No results for input(s): PROBNP in the last 8760 hours.   CBG:  Recent Labs Lab 01/28/17 0643  GLUCAP 238*    Coagulation Studies:  Recent Labs  01/28/17 0631  LABPROT 15.6*  INR 1.23    Other results:   Imaging:  No results found.   Patient Profile  [name] is a 61 year old with history 2 different breast cancers on R and L, chemo induced cardiomyopathy, h/o GI bleed with AVMS, DMII, hypothyroidism, and chronic systolic heart failure admitted from the cath lab with elevated filling pressures and reduced cardiac output.  Assessment/Plan   1. A/C Systolic Heart Failure- NICM. Chemo induced cardiomyopathy. ECHO 12/2016 Ef 15%. Admitted with marked volume overload. Diuresed with 80 mg IV lasix twice daily. Started milrinone for reduced cardiac output. Hold off on central access with h/o bilateral breast cancer. Stop bb. Continue spiro. Hold entresto and see how bp looks with diuresis and milrinone.  Plan to repeat RHC later this week.  She is being considered for LVAD for DT.  2. DMII- Place on home insulin + sliding scale. Discussed with Pharm D 3. Hypothyroidsim- continue synthroid. Check TSH 4. H/O R and L Breast Cancer- R breast treated with adriamycin 2005. L breast treated with lumpectomy and radiation.  5. H/O GI Bleed- multiple scopes with most recent 03/2016. Has h/o polyps and AVMs.  Continue PPI 6. PAF - watch closely with addition of milrinone. Continue eliquis 5 mg twice a day. 7. OSA- continue cpap.  8. ICD-  St Jude Single [place] 9. H/O NSVT- continue 200 mg amiodarone daily. ST Jude to interrogate device.  Darrick Grinder, NP 01/28/2017, 9:11 AM  Advanced Heart Failure Team Pager [phone] (M-F; [place])  Please contact [facility] for night-coverage after hours (4p -7a ) and weekends on [email or web address]  Patient seen and examined with Darrick Grinder, NP. We discussed all aspects of the encounter. I agree with the assessment and plan as stated above.   Cath data reveals severe biventricular HF with low cardiac output. Will admit for IV diuresis with inotropic support. Can restart Eliquis tonight.  Discussed with VAD team. VAD work-up is ongoing. Degree of RV is a concern as is h/o AVMs. Has tolerated Eliquis well.   Will need to discuss recent CT with oncology team and assess need for outpatient PET scan,.   Glori Bickers, MD  11:02 PM

## 2017-01-28 NOTE — Consult Note (Signed)
   [facility] CM Inpatient Consult   01/28/2017  AZALIA NEUBERGER 10-Mar-1956 [id number]   Patient is currently active with [facility] for chronic disease management services.  Patient has been engaged by a Teaching laboratory technician. Pharmacist was having difficulty maintaining contact with the patient  Our community based plan of care has focused on disease management and community resource support.  Patient will receive a post discharge transition of care call and will be evaluated for monthly home visits for assessments and disease process education. Consent form is active on file.  Will follow up Inpatient Case Manager and make aware that [facility] following. Of note, [facility] Care Management services does not replace or interfere with any services that are needed or arranged by inpatient case management or social work.  For additional questions or referrals please contact:   Natividad Brood, RN BSN [facility] Liaison  [phone] business mobile phone Toll free office [phone]

## 2017-01-28 NOTE — Progress Notes (Signed)
49fr sheath aspirated and removed from rfv, manual pressure applied for 20 minutes. Groin level 0, tegaderm dressing applied. Bilateral dp pulses present. Bedrest instructions given.   Bedrest begins at 09:30:00

## 2017-01-28 NOTE — Progress Notes (Signed)
3cc air removed from tr band. Site started to bleed, 3cc returned to tr band. No hematoma present. Will wait 20 minutes to reattempt tr band deflation.

## 2017-01-28 NOTE — H&P (View-Only) (Signed)
Patient ID: [name], female   DOB: 1956/02/26, 61 y.o.   MRN: [id number]    Advanced Heart Failure Clinic Note   Endocrinologist: Dr [name]  Oncologist: Dr. Humphrey [name] PCP: Joanne Peace, NP ([place]) Primary HF MD: Dr [name]  HPI [name] is a 61 year old female with history of R breast cancer (2005, 2008), L beast cancer (2017) obesity, HTN, DM, IBS, HLD, OSA, chemo induced cardiomyopathy s/p ICD and chronic systolic HF.    She had chemo induced cardiomyopathy.  Her EF has fallen from 45% per Cardiac MRI in July 2010 to 20-25% in 10/2012. S/P single chamber ICD (St Jude) implantation on 11/10/12 by Dr. Rayann [name].    Admitted 08/19/15 with chest pain. Had [facility] with mild CAD and increased filling pressures.  Admitted 12/21/15 with increased dyspnea and chest pain. Additionally, a new breast lump was discovered, with outpatient follow up arranged. She diuresed 15 lbs. Discharge weight 213 lbs.  Admitted 7/17 with GIB. 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 Ardis [name]. Eliquis restarted without problem.   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 Had lumpectomy 07/24/16 with Dr. Dalbert [name]. Completed XRT. No chemo planned.  She returns today for follow up. We saw her last month with NYHA IIIB symptoms. Entresto increased. Repeat CPX ordered which showed severe HF limitation.Lasix also adjusted. 2 weeks ago called clinic because BP was low and weight down 10-15 pounds. Lasix held for several days then reduced to 60mg  daily. Now taking lasix 40 bid. That has kept weight down 209-211. Feeling better but still SOB with any activity. Occasional CP but improved with CPAP. No edema. No orthopnea or PND. Gor CPAP fixed last week and now sleeping much better.    ICD interrogated personally: Volume looks good. No VT. No atrial lead.   CPX 12/23/16: FVC 2.15 (66%)    FEV1 1.57 (62%)     FEV1/FVC 73% (92%)     MVV 58 (62%)      Resting  HR: 93 Peak HR: 116  (73% age predicted max HR) BP rest: 88/60 BP peak: 104/60  Peak VO2: 9.2 (52% predicted peak VO2) VE/VCO2 slope: 51 OUES: 1.07 Peak RER: 0.99 Ventilatory Threshold: 8.0 (45% predicted or measured peak VO2) VE/MVV: 71% O2pulse: 8  (73% predicted O2pulse)  CPX 09/11/2015  Peak VO2: 12.7 (73% predicted peak VO2) VE/VCO2 slope: 32.3 OUES: 1.62 Peak RER: 1.08    RHC/LCH  08/20/2014   Mid Cx lesion, 30% stenosed.  3rd Mrg lesion, 30% stenosed. Findings: RA = 12 RV = 55/6/12 PA = 54/21 (34) PCW = 22 Ao 93/58 (71) LV 106/8/26 Fick cardiac output/index = 4.2/2.1 PVR = 2.8 SVR = 1115 FA sat = 93% PA sat = 54%, 53% Assessment: 1. Very mild CAD 2. Mild to moderately increased filling pressures 3. Severe LV dysfunction due to NICM EF 20%medications.   [place] on 01/30/12 showed decompensated HF with low cardiac output consistent with cardiogenic shock.  [place] 6/27  RA = 20  RV = 55/13/22  PA =56/34 (44)  PCW = 28  Fick cardiac output/index = 3.1/1.5  PVR = 5.3 [place]  FA sat = 91%  PA sat = 42%, 48%  SVC sat = 46%  Echo 08/12/11: Left ventricle: mildly dilated, EF 20% to 25%. Diffuse hypokinesis. Doppler parameters are consistent with restrictive physiology, indicative of decreased left ventricular diastolic compliance and/or increased left atrial pressure. Left atrium was moderately dilated.  Right atrium mildly dilated. 04/09/12 ECHO EF 30-35% LV mildly dilated  Hyopkinesis 10/07/12 ECHO EF 20-25% RV ok. 03/31/14: ECHO EF 30-35%, severe HK inferior/inferoseptal walls, mild MR, LA mod dilated, RA mildly dilated 06/28/2015: ECHO EF 25% RV mildly dilated. Severe HK inferior wall.  08/21/2015: Echo EF 30-35% Grade III DD  Myoview in 05/2010 EF 59%. No ischemia.   Review of systems complete and found to be negative unless listed in HPI.    Social History   Social History  . Marital status: Divorced    Spouse name: N/A  . Number of children: 1  . Years of  education: N/A   Occupational History  . Disabled Unemployed   Social History Main Topics  . Smoking status: Former Smoker    Packs/day: 1.50    Years: 33.00    Types: Cigarettes    Quit date: 04/12/2001  . Smokeless tobacco: Never Used     Comment: heaviest - 2.5- 3PPD x 3 years   . Alcohol use No  . Drug use: No  . Sexual activity: Not Currently   Other Topics Concern  . Not on file   Social History Narrative   Lives with mother and brother [name]         Family History  Problem Relation Age of Onset  . Diabetes Mellitus II Mother   . Lung cancer Mother   . Congestive Heart Failure Mother   . Coronary artery disease Father        CABG x 3  . Hypertension Father   . Diabetes Mellitus II Father   . Breast cancer Cousin 41       maternal first cousin  . Colon cancer Cousin 57       maternal first cousin  . Uterine cancer Cousin        dx in her 69s  . Colon cancer Maternal Aunt        dx in her late 81s  . Uterine cancer Maternal Aunt 66  . Lung cancer Maternal Aunt   . Uterine cancer Maternal Aunt 38  . Colon cancer Maternal Aunt 37  . Breast cancer Maternal Grandmother 85  . Uterine cancer Maternal Aunt 48  . Uterine cancer Cousin        dx in her 19s  . Uterine cancer Cousin 12    Past Medical History:  Diagnosis Date  . AICD (automatic cardioverter/defibrillator) present    st jude  . Anxiety   . 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 Asthma   . Axillary mass, left 10/17/2016  . Breast cancer ([place]) 2005, 2017  . CHF (congestive heart failure) ([place])   . COPD (chronic obstructive pulmonary disease) ([place])   . Depression    followed by a psychiatrist  . DYSLIPIDEMIA 05/01/2009  . Dysrhythmia    atrial fibrillation  . Family history of breast cancer   . Family history of colon cancer   . Family history of uterine cancer   . [place] 10/17/2016  . Gastroparesis   . GERD (gastroesophageal reflux disease)   . Headache   . History of breast cancer 2006   with return  in 2008, s/p mastectomy, XRT and chemotherapy  . History of external beam radiation therapy 09/25/16-11/13/16   left breast treated to 50.4 Gy in 28 fractions, boost 12 Gy in 6 fractions  . Hypertension   . 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 IBS (irritable bowel syndrome)   . ICD (implantable cardiac defibrillator) in place 11/10/2012   ICD (  11/2012)  . Malignant neoplasm of lower-inner quadrant of left female breast ([place]) 06/26/2016  . Myocardial infarction ([place])   . OBESITY 07/24/2007  . OBSTRUCTIVE SLEEP APNEA 07/24/2007   with CPAP compliance  . Osteoarthritis   . Persistent atrial fibrillation ([place]) 09/2014  . Personal history of radiation therapy    current  . Presence of permanent cardiac pacemaker   . RESTLESS LEG SYNDROME 07/24/2007  . SYSTOLIC HEART FAILURE, CHRONIC 01/05/2009   a. chemo induced cardiomyopathy b. cMRI (02/2009) EF 45% c. Myoview 05/2010: EF 59%, no ischemia d. RHC (01/2012) RA 20, RV 55/13/22, PA 56/34 (44), PCWP 28, Fick CO/CI: 3.1 / 1.5, PVR 5.3 WU, PA sat 42% & 48% e. ECHO (10/2012) EF 20-25%, diff HK, MV calcified annulus f. ECHO (03/2014) EF 30-35%, sev HK, inferior/inferoseptal walls, mild MR    Current Outpatient Prescriptions  Medication Sig Dispense Refill  . acetaminophen (TYLENOL) 650 MG CR tablet Take 1,300 mg by mouth every 8 (eight) hours as needed for pain.    Marland Kitchen albuterol (PROVENTIL HFA) 108 (90 BASE) MCG/ACT inhaler Inhale 2 puffs into the lungs every 6 (six) hours as needed. For shortness of breath    . ALPRAZolam (XANAX) 0.5 MG tablet Take 0.25 mg by mouth 4 (four) times daily as needed for anxiety.     Marland Kitchen amiodarone (PACERONE) 200 MG tablet Take 1 tablet (200 mg total) by mouth daily. 90 tablet 3  . apixaban (ELIQUIS) 5 MG TABS tablet Take 1 tablet (5 mg total) by mouth 2 (two) times daily. 180 tablet 3  . furosemide (LASIX) 20 MG tablet Take 20-40 mg by mouth as directed.    . baclofen (LIORESAL) 20 MG tablet Take 20 mg by mouth 2 (two) times daily  as needed for muscle spasms.    . busPIRone (BUSPAR) 30 MG tablet Take 30 mg by mouth 2 (two) times daily.     . Calcium Carb-Cholecalciferol (CALCIUM 600+D3) 600-800 MG-UNIT TABS Take by mouth daily.    . carvedilol (COREG) 6.25 MG tablet Take 1 tablet (6.25 mg total) by mouth 2 (two) times daily with a meal. 180 tablet 3  . digoxin (LANOXIN) 0.125 MG tablet Take 1 tablet (0.125 mg total) by mouth every other day. 45 tablet 3  . docusate sodium (COLACE) 100 MG capsule Take 3 capsules (300 mg total) by mouth at bedtime. 10 capsule 0  . DULoxetine (CYMBALTA) 30 MG capsule Take 30 mg by mouth daily.    . Ferrous Fumarate 63 (20 Fe) MG TABS Take 1 tablet by mouth 2 (two) times daily.    . Garlic 196 MG CAPS Take 500 mg by mouth daily after lunch.     Marland Kitchen HUMALOG KWIKPEN 100 UNIT/ML KiwkPen Inject 14-24 Units into the skin 3 (three) times daily. On average per patient 24 unit with breakfast, 16 units with lunch, & 14-16 units with supper    . HUMALOG MIX 75/25 KWIKPEN (75-25) 100 UNIT/ML Kwikpen Inject 75 Units into the skin 2 (two) times daily.    . hyaluronate sodium (RADIAPLEXRX) GEL Apply 1 application topically 2 (two) times daily.    . hydrOXYzine (ATARAX/VISTARIL) 50 MG tablet Take 50 mg by mouth every 6 (six) hours as needed for itching ((primarily taken at bedtime)).     . IRON PO Take 24 mg by mouth daily.    . ivabradine (CORLANOR) 5 MG TABS tablet Take 1 tablet (5 mg total) by mouth 2 (two) times daily with a meal. 180 tablet  3  . levothyroxine (SYNTHROID, LEVOTHROID) 75 MCG tablet Take 75 mcg by mouth daily before breakfast.     . meclizine (ANTIVERT) 12.5 MG tablet Take 1 tablet (12.5 mg total) by mouth 3 (three) times daily as needed for dizziness. 20 tablet 0  . Melatonin 1 MG TABS Take 0.5 mg by mouth at bedtime.    . metolazone (ZAROXOLYN) 2.5 MG tablet Take 1 tablet (2.5 mg total) by mouth as needed (for weight gain >3 lbs). (Patient not taking: Reported on 01/09/2017) 45 tablet 3  .  mometasone (NASONEX) 50 MCG/ACT nasal spray Place 1 spray into both nostrils daily. allergies    . Multiple Minerals (CALCIUM-MAGNESIUM-ZINC) TABS Take 1 tablet by mouth daily after lunch.     . nitroGLYCERIN (NITROSTAT) 0.4 MG SL tablet Place 1 tablet (0.4 mg total) under the tongue every 5 (five) minutes as needed. For chest pain 25 tablet 3  . Omega-3 Fatty Acids (FISH OIL) 1000 MG CAPS Take 2 capsules by mouth daily.    . pantoprazole (PROTONIX) 40 MG tablet Take 40 mg by mouth 2 (two) times daily.      Vladimir Faster Glycol-Propyl Glycol (SYSTANE OP) Apply to eye as needed.    . polyethylene glycol powder (GLYCOLAX/MIRALAX) powder Mix 17 grams in 8 oz of water twice daily 850 g 3  . potassium chloride SA (K-DUR,KLOR-CON) 20 MEQ tablet Take 4 tablets (80 mEq total) by mouth 2 (two) times daily. 720 tablet 3  . Propylene Glycol (SYSTANE BALANCE) 0.6 % SOLN Place 1 drop into both eyes 3 (three) times daily.    Marland Kitchen pyridOXINE (VITAMIN B-6) 100 MG tablet Take 100 mg by mouth daily after lunch.     Marland Kitchen rOPINIRole (REQUIP) 1 MG tablet Take 1 mg by mouth at bedtime and may repeat dose one time if needed. IF RESTLESS LEGS PERSIST PATIENT MAY REPEAT ADDITIONAL DOSE    . rosuvastatin (CRESTOR) 10 MG tablet Take 10 mg by mouth at bedtime.    . sacubitril-valsartan (ENTRESTO) 49-51 MG Take 1 tablet by mouth 2 (two) times daily. 60 tablet 3  . Simethicone Extra Strength 125 MG CAPS Take 1 tablet by mouth daily as needed (for bloating). Reported on 02/23/2016    . spironolactone (ALDACTONE) 25 MG tablet Take 1 tablet (25 mg total) by mouth daily. 90 tablet 3  . SUMAtriptan (IMITREX) 100 MG tablet Take 100 mg by mouth daily as needed for migraine.     . tiotropium (SPIRIVA) 18 MCG inhalation capsule Place 18 mcg into inhaler and inhale daily.      . traMADol (ULTRAM) 50 MG tablet Take 1 tablet by mouth as needed.    . vitamin B-12 (CYANOCOBALAMIN) 1000 MCG tablet Take 1,000 mcg by mouth daily after lunch.     .  vitamin C (ASCORBIC ACID) 500 MG tablet Take 500 mg by mouth daily after lunch.     . vitamin E 400 UNIT capsule Take 400 Units by mouth daily after lunch.      No current facility-administered medications for this encounter.    Vitals:   01/20/17 1353  BP: (!) 102/52  Pulse: 83  SpO2: 98%  Weight: 214 lb (97.1 kg)   Wt Readings from Last 3 Encounters:  01/20/17 214 lb (97.1 kg)  01/09/17 214 lb 6.4 oz (97.3 kg)  12/21/16 215 lb (97.5 kg)     PHYSICAL EXAM: General: Well appearing. No resp difficulty. HEENT: normal anicteric  Neck: supple. JVD 5-6 cm Carotids 2+  bilat; no bruits. No thyromegaly or nodule noted. Cor: PMI laterally displaced.Regular. No M/G/R noted Lungs: CTAB, no wheezing  Abdomen: obese soft NT/ND  no HSM. No bruits or masses. +BS  Extremities: no cyanosis, clubbing, or rash. trace 1+ ankle edema bilaterally.  Neuro: alert & oriented x 3, cranial nerves grossly intact. moves all 4 extremities w/o difficulty. Affect pleasant   ASSESSMENT & PLAN:  1) Chronic systolic HF, NICM thought to be chemo induced, s/p ST Jude  ICD.ECHO 06/28/2015  EF ~20%. RV mildly dilated. Echo 1/17 30-35%. Ech 5/18 EF 15% RV ok - NYHA IIIb-IV symptoms.  - CPX confirms severe HF limitation  - Unclear if AF is contributing to worsening EF and HF but now back in NSR.  - Discussed VAD therapy and she is interested. Will begin screening process. - Continue lasix 40 mg BID with extra as needed.  - Continue metolazone 2.5 mg as needed with extra 40 meq of potassium.  - Continue spiro 25 mg daily.  - Continue coreg 6.25 mg BID  - Continue Entresto 49/51 mg BID.  - Continue digoxin 0.125 mg daily .  - Increase corlanor to 7.5 mg twice a day.     - Reinforced fluid restriction to < 2 L daily, sodium restriction to less than 2000 mg daily, and the importance of daily weights.   2) Paroxysmal Afib -  EKG today shows NSR with 1AVB 91 bp,  - Continue Eliquis 5 mg BID. Denies bleeding.  -  CHADsVASC 4 (HTN, CAD, HF, gender) 3) OSA - Now has CPAP fixed. Continue to use.  4) HLD - Followed by PCP.  5) CAD - mild nonobstructive CAD on 2016 cath.Will need repeat cath as part of VAD w/u.  - No S/S ischemia. Continue statin, BB, and ACE-I.  6) NSVT - Continue amio 200 mg daily. Needs yearly eye exam. TSH stable 09/2016   7) Breast Cancer, bialterally - most recent left breast CA  s/p lumpectomy and Radiation 07/2016  Total time spent 45 minutes. Over half that time spent discussing above.    Glori Bickers, MD  2:30 PM

## 2017-01-28 NOTE — Progress Notes (Signed)
Paged cards fellow regarding pt's eliquis. Pt had a heart cath performed 6/26 and had been holding eliquis 4 days prior. Pt will need a repeat cath before discharge. Per Dr. William Dalton, hold eliquis for tonight and have order clarified by rounding MD in the am.  Jacqlyn Larsen, RN

## 2017-01-28 NOTE — Interval H&P Note (Signed)
History and Physical Interval Note:  01/28/2017 7:52 AM  [name]  has presented today for surgery, with the diagnosis of hf  The various methods of treatment have been discussed with the patient and family. After consideration of risks, benefits and other options for treatment, the patient has consented to  Procedure(s): Right/Left Heart Cath and Coronary Angiography (N/A) and possible coronary angioplasty as a surgical intervention .  The patient's history has been reviewed, patient examined, no change in status, stable for surgery.  I have reviewed the patient's chart and labs.  Questions were answered to the patient's satisfaction.     Elky Funches, Quillian Quince

## 2017-01-29 ENCOUNTER — Telehealth: Payer: Self-pay | Admitting: Cardiology

## 2017-01-29 DIAGNOSIS — Z515 Encounter for palliative care: Secondary | ICD-10-CM

## 2017-01-29 DIAGNOSIS — R570 Cardiogenic shock: Secondary | ICD-10-CM

## 2017-01-29 DIAGNOSIS — Z7189 Other specified counseling: Secondary | ICD-10-CM

## 2017-01-29 LAB — GLUCOSE, CAPILLARY
Glucose-Capillary: 151 mg/dL — ABNORMAL HIGH (ref 65–99)
Glucose-Capillary: 103 mg/dL — ABNORMAL HIGH (ref 65–99)
Glucose-Capillary: 159 mg/dL — ABNORMAL HIGH (ref 65–99)
Glucose-Capillary: 131 mg/dL — ABNORMAL HIGH (ref 65–99)

## 2017-01-29 LAB — HEMOGLOBIN A1C
HEMOGLOBIN A1C: 9.3 % — AB (ref 4.8–5.6)
MEAN PLASMA GLUCOSE: 220 mg/dL

## 2017-01-29 LAB — BASIC METABOLIC PANEL
ANION GAP: 8 (ref 5–15)
BUN: 17 mg/dL (ref 6–20)
CHLORIDE: 101 mmol/L (ref 101–111)
CO2: 28 mmol/L (ref 22–32)
Calcium: 9.3 mg/dL (ref 8.9–10.3)
Creatinine, Ser: 0.92 mg/dL (ref 0.44–1.00)
GFR calc Af Amer: >60 mL/min (ref >60)
Glucose, Bld: 165 mg/dL — ABNORMAL HIGH (ref 65–99)
POTASSIUM: 3 mmol/L — AB (ref 3.5–5.1)
SODIUM: 137 mmol/L (ref 135–145)

## 2017-01-29 LAB — MRSA PCR SCREENING: MRSA by PCR: NEGATIVE

## 2017-01-29 LAB — DIGOXIN LEVEL

## 2017-01-29 LAB — MAGNESIUM: MAGNESIUM: 1.5 mg/dL — AB (ref 1.7–2.4)

## 2017-01-29 MED ORDER — HYDROCERIN EX CREA
TOPICAL_CREAM | Freq: Two times a day (BID) | CUTANEOUS | Status: DC
  Administered 2017-01-29 – 2017-02-01 (×5): via TOPICAL
  Administered 2017-02-01 – 2017-02-02 (×2): 1 via TOPICAL
  Administered 2017-02-02 – 2017-02-07 (×3): via TOPICAL
  Filled 2017-01-29 (×2): qty 113

## 2017-01-29 MED ORDER — MAGNESIUM SULFATE 4 GM/100ML IV SOLN
4.0000 g | Freq: Once | INTRAVENOUS | Status: AC
  Administered 2017-01-29: 4 g via INTRAVENOUS
  Filled 2017-01-29: qty 100

## 2017-01-29 MED ORDER — IVABRADINE HCL 7.5 MG PO TABS
7.5000 mg | ORAL_TABLET | Freq: Two times a day (BID) | ORAL | Status: DC
  Administered 2017-01-29 – 2017-01-30 (×4): 7.5 mg via ORAL
  Filled 2017-01-29 (×5): qty 1

## 2017-01-29 MED ORDER — POTASSIUM CHLORIDE CRYS ER 20 MEQ PO TBCR
40.0000 meq | EXTENDED_RELEASE_TABLET | Freq: Three times a day (TID) | ORAL | Status: AC
  Administered 2017-01-29 (×3): 40 meq via ORAL
  Filled 2017-01-29: qty 4
  Filled 2017-01-29: qty 2
  Filled 2017-01-29: qty 4

## 2017-01-29 NOTE — Consult Note (Addendum)
Consultation Note Date: 01/29/2017   Patient Name: [name]  DOB: March 12, 1956  MRN: [id number]  Age / Sex: 61 y.o., female  PCP: Joanne Dandy, NP Referring Physician: Jolaine Artist, MD  Oncologist:  Dr. Lindi [name] Gastroenterologist:  Dr. Hilarie [name]  Reason for Consultation: Palliative LVAD evaluation  HPI/Patient Profile: 61 y.o. 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 Jude ICD placement, who was admitted on 01/28/2017 with acute on chronic systolic heart failure. She is followed by Dr. Haroldine [name] in the [facility].  The clinic has noted a functional decline over the last few months and decreased VO2 on cardiopulmonary exercise testing.  We have been asked for a Palliative Care consultation as part of the LVAD preparation process.  Clinical Assessment and Goals of Care:  I have reviewed medical records including EPIC notes, labs and imaging, received report from the bedside RN, and met with the patient to discuss  Advanced Directives and a preparedness plan in light of LVAD implantation planned for August 2018  I introduced Palliative Medicine as specialized medical care for people living with serious illness. It focuses on providing relief from the symptoms and stress of a serious illness. The goal is to improve quality of life for both the patient and the family.  We discussed a brief life review of the patient. 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  She is divorced and has 1 daughter, [name], who lives next door to her.  She has a brother that she helps care for and a sister who is her primary support.  [name], her sister, was a Youth worker and has an extended supportive family.  [name] plans to go  to her sister's house after the LVAD surgery.    [name] is not a "religious" person, but she believes in God and has a very strong faith that she relies upon to carry her through difficult situations.   She states with a smile, "I'm not afraid to die, because I know I will be with my Lord".  I attempted to elicit values and goals of care important to the patient.  What does she hope to gain from the LVAD surgery?  Shalyn hopes to be able to play with [name]'s grandchildren without becoming SOB.  She wants to be able to take her brother to the grocery store to help him pick out nutritious foods without being fatigued and SOB.  We discussed her bottom line.  When would she want to be let go peacefully rather than continue to fight?  [name] feels strongly that she wants to be able to get up and move.  She wants to be able to recognize and interact with her family.  She states that if she can't do these things then she is not living and she would rather die peacefully.  She also  states that if she has advanced dementia and has (lost her mind) she would not want to push to live.  Finally she states that after she is stable with the LVAD, she never wants to be intubated emergently again.  She will not remain on a ventilator long term.    I asked her about her concerns regarding her recovery.  She is concerned about having pain.  She does not like taking pain medication.  I effects her very strongly and she is worried that it is very addictive.  She has taken 1 tramadol and it "knocked me out".  I reassured her that there is a proper place for pain medication and that her medical team would provide support for her - she need not worry about addiction.  We discussed HCPOA - she will name her sister [name].  She was happy to complete the paperwork during this hospitalization.  We planned for a follow up meeting tomorrow to include her sister, [name] to review the conversation for her and answer any  questions she may have.  We will also cover more specifics including risks of surgery, artificial feeding, long term antibiotic use, dialysis, need to terminate the pump and psychological adjustments.    Primary Decision Maker:  PATIENT  Surrogate Decision Maker:  [name] (sister)    SUMMARY OF RECOMMENDATIONS    Patient is ready to move forward with the LVAD procedure.  She is willing to do whatever it takes for a successful outcome.  Her sister is supportive.  Will request Chaplain to notarize Living Will / Advance Directives  Will follow up at 8:00 am on 6/28 to meet with [name] and complete our conversation.  Code Status/Advance Care Planning:  Full code.    Prognosis:   Unable to determine       Primary Diagnoses: Present on Admission: . Acute on chronic systolic (congestive) heart failure ([place]) . Acute on chronic systolic heart failure, NYHA class 3 ([place])   I have reviewed the medical record, interviewed the patient and family, and examined the patient. The following aspects are pertinent.  Past Medical History:  Diagnosis Date  . AICD (automatic cardioverter/defibrillator) present    st jude  . Anxiety   . 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 Asthma   . Axillary mass, left 10/17/2016  . Breast cancer ([place]) 2005, 2017  . CHF (congestive heart failure) ([place])   . COPD (chronic obstructive pulmonary disease) ([place])   . Depression    followed by a psychiatrist  . DYSLIPIDEMIA 05/01/2009  . Dyspnea   . Dysrhythmia    atrial fibrillation  . Family history of breast cancer   . Family history of colon cancer   . Family history of uterine cancer   . Furuncle 10/17/2016  . Gastroparesis   . GERD (gastroesophageal reflux disease)   . Headache   . History of breast cancer 2006   with return in 2008, s/p mastectomy, XRT and chemotherapy  . History of external beam radiation therapy 09/25/16-11/13/16   left breast treated to 50.4 Gy in 28 fractions, boost 12 Gy in 6  fractions  . Hypertension   . 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 IBS (irritable bowel syndrome)   . ICD (implantable cardiac defibrillator) in place 11/10/2012   ICD (11/2012)  . Malignant neoplasm of lower-inner quadrant of left female breast ([place]) 06/26/2016  . Myocardial infarction ([place])   . OBESITY 07/24/2007  . OBSTRUCTIVE SLEEP APNEA 07/24/2007   with CPAP compliance  .  Osteoarthritis   . Persistent atrial fibrillation ([place]) 09/2014  . Personal history of radiation therapy    current  . Presence of permanent cardiac pacemaker   . RESTLESS LEG SYNDROME 07/24/2007  . SYSTOLIC HEART FAILURE, CHRONIC 01/05/2009   a. chemo induced cardiomyopathy b. cMRI (02/2009) EF 45% c. Myoview 05/2010: EF 59%, no ischemia d. RHC (01/2012) RA 20, RV 55/13/22, PA 56/34 (44), PCWP 28, Fick CO/CI: 3.1 / 1.5, PVR 5.3 WU, PA sat 42% & 48% e. ECHO (10/2012) EF 20-25%, diff HK, MV calcified annulus f. ECHO (03/2014) EF 30-35%, sev HK, inferior/inferoseptal walls, mild MR   Social History   Social History  . Marital status: Divorced    Spouse name: N/A  . Number of children: 1  . Years of education: N/A   Occupational History  . Disabled Unemployed   Social History Main Topics  . Smoking status: Former Smoker    Packs/day: 1.50    Years: 33.00    Types: Cigarettes    Quit date: 04/12/2001  . Smokeless tobacco: Never Used     Comment: heaviest - 2.5- 3PPD x 3 years   . Alcohol use No  . Drug use: No  . Sexual activity: Not Currently   Other Topics Concern  . None   Social History Narrative   Lives with mother and brother Philis Nettle         Family History  Problem Relation Age of Onset  . Diabetes Mellitus II Mother   . Lung cancer Mother   . Congestive Heart Failure Mother   . Coronary artery disease Father        CABG x 3  . Hypertension Father   . Diabetes Mellitus II Father   . Breast cancer Cousin 79       maternal first cousin  . Colon cancer Cousin 85       maternal first  cousin  . Uterine cancer Cousin        dx in her 17s  . Colon cancer Maternal Aunt        dx in her late 65s  . Uterine cancer Maternal Aunt 66  . Lung cancer Maternal Aunt   . Uterine cancer Maternal Aunt 38  . Colon cancer Maternal Aunt 17  . Breast cancer Maternal Grandmother 32  . Uterine cancer Maternal Aunt 48  . Uterine cancer Cousin        dx in her 14s  . Uterine cancer Cousin 19   Scheduled Meds: . amiodarone  200 mg Oral Daily  . apixaban  5 mg Oral BID  . busPIRone  30 mg Oral BID  . [START ON 01/30/2017] digoxin  0.125 mg Oral QODAY  . docusate sodium  300 mg Oral QHS  . DULoxetine  30 mg Oral Daily  . furosemide  80 mg Intravenous BID  . hydrocerin   Topical BID  . insulin aspart  0-15 Units Subcutaneous TID WC  . insulin aspart protamine- aspart  60 Units Subcutaneous BID WC  . levothyroxine  75 mcg Oral QAC breakfast  . pantoprazole  40 mg Oral BID  . potassium chloride  40 mEq Oral TID  . rOPINIRole  1 mg Oral QHS,MR X 1  . rosuvastatin  10 mg Oral QHS  . sodium chloride flush  3 mL Intravenous Q12H  . sodium chloride flush  3 mL Intravenous Q12H  . sodium chloride flush  3 mL Intravenous Q12H  . tiotropium  18 mcg Inhalation Daily  Continuous Infusions: . sodium chloride    . sodium chloride 10 mL/hr at 01/28/17 0655  . sodium chloride    . sodium chloride    . magnesium sulfate 1 - 4 g bolus IVPB    . milrinone 0.25 mcg/kg/min (01/29/17 0600)   PRN Meds:.sodium chloride, sodium chloride, sodium chloride, acetaminophen, albuterol, ALPRAZolam, baclofen, hydrOXYzine, ondansetron (ZOFRAN) IV, sodium chloride flush, sodium chloride flush, sodium chloride flush, SUMAtriptan, traMADol Allergies  Allergen Reactions  . Bee Venom Anaphylaxis  . Aspirin Hives  . Ciprofloxacin Hives  . Codeine Nausea Only    "can take ONLY if she eats with med"  . Naproxen Hives  . Other Other (See Comments)    ANY TYPES OF METAL-CAUSES BLISTERS SKIN AND ITCHING   .  Sulfonamide Derivatives Hives  . Xarelto [Rivaroxaban] Hives  . Atorvastatin Itching  . Nsaids Itching and Rash  . Tape Rash    Also allergic to metal  . Tyler Aas Flextouch [Insulin Degludec] Itching and Rash   Review of Systems  +fatigue and DOE +restless leg +back pain with occasional spasm +dry itchy skin Other systems reviewed and found to be negative.  Physical Exam  Very pleasant female appears older than stated age.   A&O sitting up in bed, good color CV difficult to hear but no frank m/r/g CV no distress, CTA Abdomen obese, soft NT Psych cooperative, coherent, well groomed, appropriate.  Vital Signs: BP 101/62 (BP Location: Right Arm)   Pulse 93   Temp 97.5 F (36.4 C) (Axillary)   Resp 18   Ht _0  (1.651 m)   Wt 93.4 kg (206 lb)   SpO2 96%   BMI 34.28 kg/m  Pain Assessment: No/denies pain POSS *See Group Information*: 1-Acceptable,Awake and alert Pain Score: 8    SpO2: SpO2: 96 % O2 Device:SpO2: 96 % O2 Flow Rate: .O2 Flow Rate (L/min): 3 L/min  IO: Intake/output summary:  Intake/Output Summary (Last 24 hours) at 01/29/17 1657 Last data filed at 01/29/17 0600  Gross per 24 hour  Intake            544.2 ml  Output             3000 ml  Net          -2455.8 ml    LBM: Last BM Date: 01/28/17 Baseline Weight: Weight: 95.8 kg (211 lb 2 oz) Most recent weight: Weight: 93.4 kg (206 lb)     Palliative Assessment/Data:   Flowsheet Rows     Most Recent Value  Intake Tab  Referral Department  Cardiology  Unit at Time of Referral  Cardiac/Telemetry Unit  Palliative Care Primary Diagnosis  Cardiac  Date Notified  01/28/17  Palliative Care Type  New Palliative care  Reason for referral  Other (Comment)  Date of Admission  01/28/17  Date first seen by Palliative Care  01/29/17  # of days Palliative referral response time  1 Day(s)  # of days IP prior to Palliative referral  0  Clinical Assessment  Palliative Performance Scale Score  50%  Psychosocial &  Spiritual Assessment  Palliative Care Outcomes  Patient/Family meeting held?  Yes  Who was at the meeting?  Patient  Palliative Care Outcomes  ACP counseling assistance, Other (Comment), Provided psychosocial or spiritual support  Patient/Family wishes: Interventions discontinued/not started   Mechanical Ventilation      Time In: 8:00 Time Out: 9:10 Time Total: 70 min. Greater than 50%  of this time was spent counseling and  coordinating care related to the above assessment and plan.  Signed by: Imogene Burn, PA-C Palliative Medicine Pager: [phone]  Please contact Palliative Medicine Team phone at [phone] for questions and concerns.  For individual provider: See Shea Evans

## 2017-01-29 NOTE — Plan of Care (Signed)
Problem: Fluid Volume: Goal: Ability to maintain a balanced intake and output will improve Outcome: Progressing Pt actively diuresing

## 2017-01-29 NOTE — Progress Notes (Signed)
   Per patient request, Advanced Directive (AD) documentation left at bedside.  If/when patient decides to move forward w/ AD, please page on-call chaplain or contact the spiritual care department (Mon-Fri 9AM-3PM)  Will follow, as needed.  

## 2017-01-29 NOTE — Telephone Encounter (Signed)
Attempted to call pt b/c her home monitor has not updated in the last 7 days. Pt is currently in the hospital and unavailable by phone.

## 2017-01-29 NOTE — Progress Notes (Signed)
Advanced Heart Failure Rounding Note  PCP:  [name] NP Primary Cardiologist: Dr Haroldine Laws Endocrinologist: Dr Loanne Drilling Oncologist: Dr Lindi Adie GI: Dr Havery Moros Subjective:    Admitted 01/28/17 after [place], started on milrinone for reduced cardiac output, volume overload.   Weight down 5 pounds. Says that her breathing is much improved. Having dizziness when going from sitting to standing. Denies chest pain and SOB.   Creatinine 1.21->0.92.  Objective:   Weight Range: 206 lb (93.4 kg) Body mass index is 34.28 kg/m.   Vital Signs:   Temp:  [97 F (36.1 C)-98.2 F (36.8 C)] 97.5 F (36.4 C) (06/27 0609) Pulse Rate:  [80-93] 93 (06/27 0609) Resp:  [13-21] 18 (06/27 0609) BP: (93-112)/(57-77) 101/62 (06/27 0609) SpO2:  [94 %-100 %] 96 % (06/27 0609) Weight:  [206 lb (93.4 kg)-206 lb 1.6 oz (93.5 kg)] 206 lb (93.4 kg) (06/27 0609) Last BM Date: 01/28/17  Weight change: Filed Weights   01/28/17 0552 01/28/17 1024 01/29/17 0609  Weight: 211 lb 2 oz (95.8 kg) 206 lb 1.6 oz (93.5 kg) 206 lb (93.4 kg)    Intake/Output:   Intake/Output Summary (Last 24 hours) at 01/29/17 0939 Last data filed at 01/29/17 0600  Gross per 24 hour  Intake            544.2 ml  Output             3000 ml  Net          -2455.8 ml      Physical Exam    General:  Well appearing. No resp difficulty HEENT: Normal Neck: Supple. JVP to jaw . Carotids 2+ bilat; no bruits. No lymphadenopathy or thyromegaly appreciated. Cor: PMI nondisplaced. Regular rate & rhythm. No rubs, gallops or murmurs. Lungs: Clear in upper lobes, diminished crackles in bases.  Abdomen: Obese, soft, nontender, nondistended. No hepatosplenomegaly. No bruits or masses. Good bowel sounds. Extremities: No cyanosis, clubbing, rash. 1+ pretibial edema.  Neuro: Alert & orientedx3, cranial nerves grossly intact. moves all 4 extremities w/o difficulty. Affect pleasant   Telemetry   NSR, sinus tach.   EKG    NSR.   Labs      CBC  Recent Labs  01/28/17 0631  WBC 6.9  HGB 13.4  HCT 42.0  MCV 92.1  PLT 496   Basic Metabolic Panel  Recent Labs  01/28/17 0631 01/28/17 0918 01/29/17 0550  NA 137  --  137  K 3.6  --  3.0*  CL 102  --  101  CO2 26  --  28  GLUCOSE 230*  --  165*  BUN 23*  --  17  CREATININE 1.21*  --  0.92  CALCIUM 9.3  --  9.3  MG  --  1.6* 1.5*   Liver Function Tests  Recent Labs  01/28/17 0918  AST 21  ALT 18  ALKPHOS 83  BILITOT 1.4*  PROT 6.9  ALBUMIN 3.3*    BNP: BNP (last 3 results)  Recent Labs  07/23/16 1200 12/21/16 1054  BNP 182.4* 474.9*    Hemoglobin A1C  Recent Labs  01/28/17 0922  HGBA1C 9.3*   Fasting Lipid Panel  Recent Labs  01/28/17 0921  CHOL 83  HDL 25*  LDLCALC 46  TRIG 58  CHOLHDL 3.3       Imaging   Right/Left Heart Cath and Coronary Angiography 01/29/17 Findings:  Ao = 96/64 (76) LV = 96/37 RA =  20 RV =  54/21 PA =  59/30 (39) PCW = 33 Fick cardiac output/index = 3.1/1.6 PVR = 1.9 WU FA sat = 100% PA sat = 52%, 54% RA/PCWP = 0.61 PAPi = 1.45  Assessment:  1. Minimal non-obstructive CAD 2. Severe NICM with EF 10-15% 3. Elevated filling pressures with biventricular failure and severely reduced cardiac output   Plan/Discussion:  Admit for IV diuresis with milrinone support. Will need repeat RHC after she is tuned up to reassess VAD with regards to RV function.   Medications:     Scheduled Medications: . amiodarone  200 mg Oral Daily  . apixaban  5 mg Oral BID  . busPIRone  30 mg Oral BID  . [START ON 01/30/2017] digoxin  0.125 mg Oral QODAY  . docusate sodium  300 mg Oral QHS  . DULoxetine  30 mg Oral Daily  . furosemide  80 mg Intravenous BID  . hydrocerin   Topical BID  . insulin aspart  0-15 Units Subcutaneous TID WC  . insulin aspart protamine- aspart  60 Units Subcutaneous BID WC  . levothyroxine  75 mcg Oral QAC breakfast  . pantoprazole  40 mg Oral BID  . potassium chloride   40 mEq Oral TID  . rOPINIRole  1 mg Oral QHS,MR X 1  . rosuvastatin  10 mg Oral QHS  . sodium chloride flush  3 mL Intravenous Q12H  . sodium chloride flush  3 mL Intravenous Q12H  . sodium chloride flush  3 mL Intravenous Q12H  . tiotropium  18 mcg Inhalation Daily     Infusions: . sodium chloride    . sodium chloride 10 mL/hr at 01/28/17 0655  . sodium chloride    . sodium chloride    . magnesium sulfate 1 - 4 g bolus IVPB    . milrinone 0.25 mcg/kg/min (01/29/17 0600)     PRN Medications:  sodium chloride, sodium chloride, sodium chloride, acetaminophen, albuterol, ALPRAZolam, baclofen, hydrOXYzine, ondansetron (ZOFRAN) IV, sodium chloride flush, sodium chloride flush, sodium chloride flush, SUMAtriptan, traMADol    Patient Profile   Ms Sakuma is a 61 year old with a history of R 2005/2008 and L 2017  breast cancer, HTN, DM, IBS, hyperlipidemia, chronic systolic heart failure, NICM chemo induced cardiomyopathy, St Jude ICD, and GI bleed S/B AVMs. R Breast cancer treatment with adriamycin in 2005. L breast cancer treatment with lumpectomy and radiation.   Assessment/Plan   1. A/C Systolic Heart Failure: NICM. Chemo induced cardiomyopathy. ECHO 12/2016 EF 15%. Admitted with marked volume overload.  - Continue milrinone 0.25 mcg - Continue IV Lasix 80mg  BID.  - Continue digoxin 0.125 mg - Entresto on hold, she is hypotensive/dizzy  - Restart corlanor 7.5 mg BID.  - No PICC with history of bilateral breast CA.  - No beta blocker with decompensation  - Will repeat RHC later this week   - She is being considered for LVAD for DT.  2. DM - Continue SSI 3. Hypothyroidism - Check TSH  - Continue synthroid 4. H/O R and L Breast Cancer: R breast treated with adriamycin 2005. L breast treated with lumpectomy and radiation.  5. H/O GI Bleed- multiple scopes with most recent 03/2016. Has h/o polyps and AVMs.  - Continue PPI 6. PAF  - Remains in NSR - Continue Eliquis.  7.  OSA - Continue CPAP 8. ICD - Stable.  9. H/O NSVT - Continue Amiodarone 200 mg daily.     Length of Stay: [place], NP  01/29/2017, 9:39  AM  Advanced Heart Failure Team Pager [phone] (M-F; 7a - 4p)  Please contact [facility] for night-coverage after hours (4p -7a ) and weekends on [email or web address]  Patient seen and examined with Jettie Booze, NP. We discussed all aspects of the encounter. I agree with the assessment and plan as stated above.   She is improving with milrinone and IV diuresis. Less dyspneic. Renal function improved. Still with some volume on board though. Will continue current regimen.  Suspect she may need home milrinone prior to VAD. Will need repeat RHC prior to d/c.   Will reach out to Dr. Lindi Adie to review chest CT and need for PET scan to assess for possible recurrence of breast CA.   Glori Bickers, MD  6:27 PM

## 2017-01-29 NOTE — Progress Notes (Signed)
Initial Nutrition Assessment  DOCUMENTATION CODES:   Obesity unspecified  INTERVENTION:    Send HS snack daily  NUTRITION DIAGNOSIS:   Increased nutrient needs related to chronic illness (HF) as evidenced by estimated needs.  GOAL:   Patient will meet greater than or equal to 90% of their needs  MONITOR:   PO intake, Labs, I & O's  REASON FOR ASSESSMENT:   Consult  (VAD evaluation)  ASSESSMENT:   61 yo female with PMH of R&L breast CA, HTN, DM, IBS, HLD, HF, chemo induced cardiomyopathy who was admitted on 6/26 with acute on chronic systolic heart failure.   Patient reports variable appetite at home PTA, eating "like a piggy" some days and eating poorly other days. She will drink an Ensure if she doesn't eat a good breakfast. Since admission, she has had a good appetite and is eating well, consuming 100% of meals. She reports having good blood sugar control at home. Noted A1C 9.3 elevated.  Nutrition-Focused physical exam completed. Findings are no fat depletion, no muscle depletion, and mild edema.  Weight stable PTA per patient, down since admission with diuresis.  Labs reviewed: potassium 3 (L), magnesium 1.5 (L), A1C 9.3 (H) CBG's: 185-160-151 Medications reviewed and include Colace, Lasix, KCl, magnesium sulfate.  Patient is well-nourished. She reports that her blood sugar drops in the middle of the night. Will send a snack at bedtime per her request.  Diet Order:  Diet Heart Room service appropriate? Yes; Fluid consistency: Thin  Skin:  Reviewed, no issues  Last BM:  6/26  Height:   Ht Readings from Last 1 Encounters:  01/28/17 5\' 5"  (1.651 m)    Weight:   Wt Readings from Last 1 Encounters:  01/29/17 206 lb (93.4 kg)    Ideal Body Weight:  56.8 kg  BMI:  Body mass index is 34.28 kg/m.  Estimated Nutritional Needs:   Kcal:  1700-1900  Protein:  90-110 gm  Fluid:  1.7-1.9 L  EDUCATION NEEDS:   No education needs identified at this  time  Molli Barrows, [place], [place], [place] Pager [phone] After Hours Pager [phone]

## 2017-01-30 ENCOUNTER — Encounter: Payer: Medicare Other | Admitting: Cardiothoracic Surgery

## 2017-01-30 ENCOUNTER — Encounter (HOSPITAL_COMMUNITY): Payer: Self-pay | Admitting: *Deleted

## 2017-01-30 DIAGNOSIS — I5023 Acute on chronic systolic (congestive) heart failure: Secondary | ICD-10-CM

## 2017-01-30 DIAGNOSIS — Z7189 Other specified counseling: Secondary | ICD-10-CM

## 2017-01-30 LAB — BASIC METABOLIC PANEL
Anion gap: 12 (ref 5–15)
BUN: 15 mg/dL (ref 6–20)
CO2: 29 mmol/L (ref 22–32)
CREATININE: 0.87 mg/dL (ref 0.44–1.00)
Calcium: 9.2 mg/dL (ref 8.9–10.3)
Chloride: 96 mmol/L — ABNORMAL LOW (ref 101–111)
GFR calc Af Amer: >60 mL/min (ref >60)
Glucose, Bld: 90 mg/dL (ref 65–99)
Potassium: 3.8 mmol/L (ref 3.5–5.1)
SODIUM: 137 mmol/L (ref 135–145)

## 2017-01-30 LAB — LUPUS ANTICOAGULANT PANEL
DRVVT: 72.8 s — ABNORMAL HIGH (ref 0.0–47.0)
PTT Lupus Anticoagulant: 42.1 s (ref 0.0–51.9)

## 2017-01-30 LAB — HEPATITIS B SURFACE ANTIGEN: Hepatitis B Surface Ag: NEGATIVE

## 2017-01-30 LAB — HEPATITIS B SURFACE ANTIBODY,QUALITATIVE: Hep B S Ab: NONREACTIVE

## 2017-01-30 LAB — GLUCOSE, CAPILLARY
Glucose-Capillary: 143 mg/dL — ABNORMAL HIGH (ref 65–99)
Glucose-Capillary: 184 mg/dL — ABNORMAL HIGH (ref 65–99)
Glucose-Capillary: 130 mg/dL — ABNORMAL HIGH (ref 65–99)
Glucose-Capillary: 121 mg/dL — ABNORMAL HIGH (ref 65–99)

## 2017-01-30 LAB — TSH: TSH: 4.661 u[IU]/mL — AB (ref 0.350–4.500)

## 2017-01-30 LAB — DRVVT MIX: dRVVT Mix: 44.7 s (ref 0.0–47.0)

## 2017-01-30 LAB — HEPATITIS C ANTIBODY

## 2017-01-30 LAB — HEPATITIS B CORE ANTIBODY, IGM: Hep B C IgM: NEGATIVE

## 2017-01-30 MED ORDER — METOLAZONE 5 MG PO TABS
5.0000 mg | ORAL_TABLET | Freq: Once | ORAL | Status: AC
  Administered 2017-01-30: 5 mg via ORAL
  Filled 2017-01-30: qty 1

## 2017-01-30 MED ORDER — NITROGLYCERIN 0.4 MG SL SUBL
SUBLINGUAL_TABLET | SUBLINGUAL | Status: AC
  Administered 2017-01-30: 0.4 mg
  Filled 2017-01-30: qty 1

## 2017-01-30 NOTE — Progress Notes (Signed)
Daily Progress Note   Patient Name: [name]       Date: 01/30/2017 DOB: 06-01-1956  Age: 61 y.o. MRN#: [id number] Attending Physician: Jolaine Artist, MD Primary Care Physician: Lowella Dandy, NP Admit Date: 01/28/2017  Reason for Consultation/Follow-up: LVAD preparedness conversation  Subjective: I met with Embree and [name] (sister and HCPOA) today.  They are both very clear about her goals around what is an acceptable quality of life.  Per [name], "breath in the body is not life".  Volanda wants to be able to walk, grocery shop, and play with the grand children in the family without disabling fatigue and shortness of breath. If she is unable to do that - then she would rather be let go.  We discussed:  Artificial nutrition, blood transfusions, hemodialysis, mechanical ventilation,   Adysen will do what-ever it takes to successfully come thru the LVAD surgery and be stable.  After that is complete she would not want intubation or artificial nutrition.  She would consider hemodialysis, long term antibiotics, blood transfusions based on whether or not it would provide her quality of life.  We discussed some of the risks of LVAD surgery including stroke, GI bleeding, LVAD infection or failure, risks of anesthesia.  [place] both have a very realistic view of major surgery and appear to understand and accept the risks.  Kyriaki tells me she is excited about having the opportunity to live again.  We discussed the difficult recovery immediately after surgery and then after discharge.    [place] is an organ donor.  She has completed her advanced directives.  She has an extremely supportive and knowledgeable sister (who is also her irish twin and an Therapist, sports). She is [place] and derives a  great deal of support from her belief in God and eternal life.    She appears to be mentally very well prepared for the LVAD placement.   Length of Stay: 2  Current Medications: Scheduled Meds:  . amiodarone  200 mg Oral Daily  . apixaban  5 mg Oral BID  . busPIRone  30 mg Oral BID  . digoxin  0.125 mg Oral QODAY  . docusate sodium  300 mg Oral QHS  . DULoxetine  30 mg Oral Daily  . furosemide  80 mg Intravenous BID  .  hydrocerin   Topical BID  . insulin aspart  0-15 Units Subcutaneous TID WC  . insulin aspart protamine- aspart  60 Units Subcutaneous BID WC  . ivabradine  7.5 mg Oral BID WC  . levothyroxine  75 mcg Oral QAC breakfast  . metolazone  5 mg Oral Once  . pantoprazole  40 mg Oral BID  . rOPINIRole  1 mg Oral QHS,MR X 1  . rosuvastatin  10 mg Oral QHS  . sodium chloride flush  3 mL Intravenous Q12H  . sodium chloride flush  3 mL Intravenous Q12H  . sodium chloride flush  3 mL Intravenous Q12H  . tiotropium  18 mcg Inhalation Daily    Continuous Infusions: . sodium chloride    . sodium chloride 10 mL/hr at 01/28/17 0655  . sodium chloride    . sodium chloride    . milrinone 0.25 mcg/kg/min (01/30/17 0734)    PRN Meds: sodium chloride, sodium chloride, sodium chloride, acetaminophen, albuterol, ALPRAZolam, baclofen, hydrOXYzine, ondansetron (ZOFRAN) IV, sodium chloride flush, sodium chloride flush, sodium chloride flush, SUMAtriptan, traMADol  Physical Exam        Sitting up awake, A&O, chipper Breathing on oxygen with NAD  Vital Signs: BP 106/71 (BP Location: Left Arm)   Pulse (!) 105   Temp 97.8 F (36.6 C) (Oral)   Resp 17   Ht '5\' 5"'$  (1.651 m)   Wt 95.1 kg (209 lb 9.6 oz) Comment: re-weighed standing  SpO2 99%   BMI 34.88 kg/m  SpO2: SpO2: 99 % O2 Device: O2 Device: Not Delivered O2 Flow Rate: O2 Flow Rate (L/min): 3 L/min  Intake/output summary:  Intake/Output Summary (Last 24 hours) at 01/30/17 0834 Last data filed at 01/30/17 0427  Gross  per 24 hour  Intake          1672.03 ml  Output             3300 ml  Net         -1627.97 ml   LBM: Last BM Date: 01/29/17 Baseline Weight: Weight: 95.8 kg (211 lb 2 oz) Most recent weight: Weight: 95.1 kg (209 lb 9.6 oz) (re-weighed standing)       Palliative Assessment/Data:    Flowsheet Rows     Most Recent Value  Intake Tab  Referral Department  Cardiology  Unit at Time of Referral  Cardiac/Telemetry Unit  Palliative Care Primary Diagnosis  Cardiac  Date Notified  01/28/17  Palliative Care Type  New Palliative care  Reason for referral  Other (Comment)  Date of Admission  01/28/17  Date first seen by Palliative Care  01/29/17  # of days Palliative referral response time  1 Day(s)  # of days IP prior to Palliative referral  0  Clinical Assessment  Palliative Performance Scale Score  50%  Psychosocial & Spiritual Assessment  Palliative Care Outcomes  Patient/Family meeting held?  Yes  Who was at the meeting?  Patient  Palliative Care Outcomes  ACP counseling assistance, Other (Comment), Provided psychosocial or spiritual support  Patient/Family wishes: Interventions discontinued/not started   Mechanical Ventilation      Patient Active Problem List   Diagnosis Date Noted  . DNR (do not resuscitate) discussion   . Palliative care encounter   . Acute on chronic systolic (congestive) heart failure ([place]) 01/28/2017  . Acute on chronic systolic heart failure, NYHA class 3 ([place]) 01/28/2017  . Axillary mass, left 10/17/2016  . Boil 10/17/2016  . Genetic testing 08/15/2016  . Family  history of breast cancer   . Family history of uterine cancer   . Family history of colon cancer   . Malignant neoplasm of lower-inner quadrant of left female breast ([place]) 06/26/2016  . Small bowel polyp   . CHF (congestive heart failure) ([place]) 12/21/2015  . Acute blood loss anemia   . Atrial fibrillation ([place]) 12/11/2015  . Benign neoplasm of descending colon   . Upper GI bleeding   .  Iron deficiency anemia due to chronic blood loss   . Acute upper GI bleeding 11/14/2015  . Anemia 11/14/2015  . Absolute anemia   . Symptomatic anemia   . Bleeding gastrointestinal   . PAF (paroxysmal atrial fibrillation) ([place]) 09/22/2015  . Chest pain at rest 08/19/2015  . Chest pain 08/19/2015  . NSVT (nonsustained ventricular tachycardia) ([place]) 06/30/2015  . Breast cancer of lower-outer quadrant of right female breast ([place]) 07/22/2014  . CAD (coronary artery disease) 02/17/2014  . Type 1 diabetes mellitus with neurological manifestations ([place]) 01/06/2013  . Cardiogenic shock ([place]) 01/31/2012  . PALPITATIONS 09/20/2010  . Mixed hypercholesterolemia and hypertriglyceridemia 05/01/2009  . HYPERTENSION, BENIGN 01/05/2009  . FOOT PAIN 11/29/2008  . Sinus tachycardia 10/25/2008  . 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 Obesity 07/24/2007  . Obstructive sleep apnea 07/24/2007  . RESTLESS LEG SYNDROME 07/24/2007    Palliative Care Plan    Recommendations/Plan:  Please consult Palliative in August when she returns for LVAD surgery.  She is a joy, and we would appreciate the opportunity to provide her support.  Goals of Care and Additional Recommendations:  Limitations on Scope of Treatment: Full Scope Treatment  Code Status:  Full code  Prognosis:   Unable to determine   Discharge Planning:  Home with [place] was discussed with patient.  Thank you for allowing the Palliative Medicine Team to assist in the care of this patient.  Total time spent:  50 min.     Greater than 50%  of this time was spent counseling and coordinating care related to the above assessment and plan.  Imogene Burn, PA-C Palliative Medicine  Please contact Palliative MedicineTeam phone at [phone] for questions and concerns between 7 am - 7 pm.   Please see AMION for individual provider pager numbers.

## 2017-01-30 NOTE — Progress Notes (Signed)
Advanced Heart Failure Rounding Note  PCP:  [name] NP Primary Cardiologist: Dr Haroldine Laws Endocrinologist: Dr Loanne Drilling Oncologist: Dr Lindi Adie GI: Dr Havery Moros Subjective:    Admitted 01/28/17 after [place], started on milrinone for reduced cardiac output, volume overload.   Weight up 3 pounds overnight, re weighed patient with RN for accuracy. Feels well today. Breathing much better, does not feel SOB at rest. No chest pain or palpitations. She walked to the end of the hall and back yesterday without SOB.   Creatinine 1.21->0.92->0.82.   Objective:   Weight Range: 210 lb 4.8 oz (95.4 kg) Body mass index is 35 kg/m.   Vital Signs:   Temp:  [97.5 F (36.4 C)-98 F (36.7 C)] 97.5 F (36.4 C) (06/28 0428) Pulse Rate:  [89-100] 98 (06/28 0428) Resp:  [14-19] 16 (06/28 0428) BP: (91-106)/(55-69) 91/55 (06/28 0428) SpO2:  [95 %-99 %] 99 % (06/28 0428) Weight:  [210 lb 4.8 oz (95.4 kg)] 210 lb 4.8 oz (95.4 kg) (06/28 0428) Last BM Date: 01/28/17  Weight change: Filed Weights   01/28/17 1024 01/29/17 0609 01/30/17 0428  Weight: 206 lb 1.6 oz (93.5 kg) 206 lb (93.4 kg) 210 lb 4.8 oz (95.4 kg)    Intake/Output:   Intake/Output Summary (Last 24 hours) at 01/30/17 0721 Last data filed at 01/30/17 0427  Gross per 24 hour  Intake          1672.03 ml  Output             3300 ml  Net         -1627.97 ml      Physical Exam    General: Elderly female, NAD.  HEENT: Normal Neck: Supple. JVP 8-9 cm. Carotids 2+ bilat; no bruits. No thyromegaly or nodule noted. Cor: PMI nondisplaced. Tachy, reg rhythm, No M/G/R noted Lungs: Clear bilaterally, no wheezing.  Abdomen: Soft, non-tender, non-distended, no HSM. No bruits or masses. +BS  Extremities: No cyanosis, clubbing, rash, 1+ pedal edema, trace pretibial edema.  Neuro: Alert & orientedx3, cranial nerves grossly intact. moves all 4 extremities w/o difficulty. Affect pleasant    Telemetry   Sinus 90-110. Personally  reviewed   EKG    NSR   Labs    CBC  Recent Labs  01/28/17 0631  WBC 6.9  HGB 13.4  HCT 42.0  MCV 92.1  PLT 353   Basic Metabolic Panel  Recent Labs  01/28/17 0918 01/29/17 0550 01/30/17 0439  NA  --  137 137  K  --  3.0* 3.8  CL  --  101 96*  CO2  --  28 29  GLUCOSE  --  165* 90  BUN  --  17 15  CREATININE  --  0.92 0.87  CALCIUM  --  9.3 9.2  MG 1.6* 1.5*  --    Liver Function Tests  Recent Labs  01/28/17 0918  AST 21  ALT 18  ALKPHOS 83  BILITOT 1.4*  PROT 6.9  ALBUMIN 3.3*    BNP: BNP (last 3 results)  Recent Labs  07/23/16 1200 12/21/16 1054  BNP 182.4* 474.9*    Hemoglobin A1C  Recent Labs  01/28/17 0922  HGBA1C 9.3*   Fasting Lipid Panel  Recent Labs  01/28/17 0921  CHOL 83  HDL 25*  LDLCALC 46  TRIG 58  CHOLHDL 3.3       Imaging   Right/Left Heart Cath and Coronary Angiography 01/29/17 Findings:  Ao = 96/64 (76) LV = 96/37  RA =  20 RV =  54/21 PA =  59/30 (39) PCW = 33 Fick cardiac output/index = 3.1/1.6 PVR = 1.9 WU FA sat = 100% PA sat = 52%, 54% RA/PCWP = 0.61 PAPi = 1.45  Assessment:  1. Minimal non-obstructive CAD 2. Severe NICM with EF 10-15% 3. Elevated filling pressures with biventricular failure and severely reduced cardiac output   Plan/Discussion:  Admit for IV diuresis with milrinone support. Will need repeat RHC after she is tuned up to reassess VAD with regards to RV function.   Medications:     Scheduled Medications: . amiodarone  200 mg Oral Daily  . apixaban  5 mg Oral BID  . busPIRone  30 mg Oral BID  . digoxin  0.125 mg Oral QODAY  . docusate sodium  300 mg Oral QHS  . DULoxetine  30 mg Oral Daily  . furosemide  80 mg Intravenous BID  . hydrocerin   Topical BID  . insulin aspart  0-15 Units Subcutaneous TID WC  . insulin aspart protamine- aspart  60 Units Subcutaneous BID WC  . ivabradine  7.5 mg Oral BID WC  . levothyroxine  75 mcg Oral QAC breakfast  .  pantoprazole  40 mg Oral BID  . rOPINIRole  1 mg Oral QHS,MR X 1  . rosuvastatin  10 mg Oral QHS  . sodium chloride flush  3 mL Intravenous Q12H  . sodium chloride flush  3 mL Intravenous Q12H  . sodium chloride flush  3 mL Intravenous Q12H  . tiotropium  18 mcg Inhalation Daily    Infusions: . sodium chloride    . sodium chloride 10 mL/hr at 01/28/17 0655  . sodium chloride    . sodium chloride    . milrinone 0.25 mcg/kg/min (01/30/17 0554)    PRN Medications: sodium chloride, sodium chloride, sodium chloride, acetaminophen, albuterol, ALPRAZolam, baclofen, hydrOXYzine, ondansetron (ZOFRAN) IV, sodium chloride flush, sodium chloride flush, sodium chloride flush, SUMAtriptan, traMADol    Patient Profile   [name] is a 61 year old with a history of R 2005/2008 and L 2017  breast cancer, HTN, DM, IBS, hyperlipidemia, chronic systolic heart failure, NICM chemo induced cardiomyopathy, [employer] ICD, and GI bleed S/B AVMs. R Breast cancer treatment with adriamycin in 2005. L breast cancer treatment with lumpectomy and radiation.   Assessment/Plan   1. A/C Systolic Heart Failure: NICM. Chemo induced cardiomyopathy. ECHO 12/2016 EF 15%. Admitted with marked volume overload.  - Continue milrinone 0.25 mcg  - Continue IV lasix 80mg  BID - Give 5 mg metolazone today.  - Continue digoxin 0.125 mg - BP too soft for Entresto.  - Continue corlanor 7.5 mg BID.  - No PICC with history of bilateral breast CA.  - No beta blocker with decompensation  - Will repeat RHC either tomorrow or Monday.  - She is being considered for LVAD for DT.  2. DM - Continue SSI  3. Hypothyroidism - TSH 4.661 - Continue synthroid.  4. H/O R and L Breast Cancer: R breast treated with adriamycin 2005. L breast treated with lumpectomy and radiation.  - Dr. Haroldine Laws will speak to Dr. Lindi Adie about PET scan.  5. H/O GI Bleed- multiple scopes with most recent 03/2016. Has h/o polyps and AVMs.  - No further. Denies  melena and hematochezia.  6. PAF  - Remains in NSR - Continue Eliquis 7. OSA - Continue CPAP hs.  8. ICD - Stable.  9. H/O NSVT - Continue Amio 200mg  BID.  Will need RHC prior to DC, may need home milrinone prior to VAD.   Length of Stay: [place], NP  01/30/2017, 7:21 AM  Advanced Heart Failure Team Pager [phone] (M-F; 7a - 4p)  Please contact [facility] for night-coverage after hours (4p -7a ) and weekends on [email or web address]  Patient seen and examined with Jettie Booze, NP. We discussed all aspects of the encounter. I agree with the assessment and plan as stated above.   Feeling better on milrinone. Volume status still up. Will continue milrinone and IV diuresis. Add metolazone. Will likely need home milrinone (through tunnel PICC). Will need repeat RHC prior to d/c to assess response to milrinone and candidacy for VAD.  I reviewed CT with Drs. Pres[place] Gum and [place]. Multiple LNs and LUL mass concerning for possible recurrent breast CA. Will need outpatient PET scan son after d/c.   Glori Bickers, MD  8:57 AM

## 2017-01-30 NOTE — Progress Notes (Signed)
Patient has lasix 80 mg IV BID scheduled. BP has been soft. Re-checked manually 95/60. Pt is asymptomatic. Paged PA [place] to inform of BP and if lasix should be given. He said it was fine to give since she still seems overload according to her lab values and to just monitor blood pressures. Will pass onto night shift RN and continue to monitor.

## 2017-01-30 NOTE — Consult Note (Signed)
[address]       ,[place] [address]             [id number]        [name] Medical Record #[id number] Date of Birth: Aug 18, 1955  Referring:D Bensimhon MD Primary Care: Lowella Dandy, NP  Chief Complaint:   No chief complaint on file. 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 Status post right mastectomy with chemotherapy in 2008, recent left breast lumpectomy and radiation to chest[lymph nodes negative] in 2017-most recent radiation therapy to the left chest and in March of this year, 4 months ago.  She was admitted for heart failure most recently and found to be in low output cardiogenic shock. Last CPX indicated VO2 max of 9 0.2. She was placed on IV milrinone with improved mixed venous saturation and symptoms. Her initial CVP was high and she was aggressively diuresed. She is currently being optimized hemodynamically for possible implantable LVAD. She has had an AICD placed. No previous thoracic surgery or thoracic trauma.  The patient has a CT scan showing a lobular 2.5 cm density in the left upper lobe suspicious for malignancy. She has a heavy history of smoking in the past.  The patient has history of a small bowel AVM with GI bleeding requiring 4-5 units of blood transfusion last year. At that time she was taking Eliqus. GI medicine located the AVM and cauterized it. She is currently on Eliquis without recurrent bleeding problems. She had 1 episode recently probably related to hemorrhoidal bleeding.  The patient has deformed thorax due to right mastectomy with skin graft over 10 years ago. Most recently [ 4 months ago] radiation to the L chest in the area of the  sternum With tattoo markers evident in the area of the potential sternal incision.  Current Activity/ Functional Status: Supported by her sister  Sedentary lifestyle Zubrod Score: At the time of surgery this patient's most appropriate activity status/level should be described as: []     0    Normal activity, no symptoms []     1    Restricted in physical strenuous activity but ambulatory, able to do out light work []     2    Ambulatory and capable of self care, unable to do work activities, up and about                 more than 50%  Of the time                            [x]     3    Only limited self care, in bed greater than 50% of waking hours []     4    Completely disabled, no self care, confined to bed or chair []     5    Moribund  Past Medical History:  Diagnosis Date  . AICD (automatic cardioverter/defibrillator) present    st jude  . Anxiety   . 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 Asthma   . Axillary mass, left 10/17/2016  . Breast cancer ([place]) 2005, 2017  . CHF (congestive heart failure) ([place])   . COPD (chronic obstructive pulmonary disease) ([place])   .  Depression    followed by a psychiatrist  . DYSLIPIDEMIA 05/01/2009  . Dyspnea   . Dysrhythmia    atrial fibrillation  . Family history of breast cancer   . Family history of colon cancer   . Family history of uterine cancer   . Furuncle 10/17/2016  . Gastroparesis   . GERD (gastroesophageal reflux disease)   . Headache   . History of breast cancer 2006   with return in 2008, s/p mastectomy, XRT and chemotherapy  . History of external beam radiation therapy 09/25/16-11/13/16   left breast treated to 50.4 Gy in 28 fractions, boost 12 Gy in 6 fractions  . Hypertension   . 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 IBS (irritable bowel syndrome)   . ICD (implantable cardiac defibrillator) in place 11/10/2012   ICD (11/2012)  . Malignant neoplasm of lower-inner quadrant of left female breast ([place]) 06/26/2016  . Myocardial infarction ([place])   .  OBESITY 07/24/2007  . OBSTRUCTIVE SLEEP APNEA 07/24/2007   with CPAP compliance  . Osteoarthritis   . Persistent atrial fibrillation ([place]) 09/2014  . Personal history of radiation therapy    current  . Presence of permanent cardiac pacemaker   . RESTLESS LEG SYNDROME 07/24/2007  . SYSTOLIC HEART FAILURE, CHRONIC 01/05/2009   a. chemo induced cardiomyopathy b. cMRI (02/2009) EF 45% c. Myoview 05/2010: EF 59%, no ischemia d. RHC (01/2012) RA 20, RV 55/13/22, PA 56/34 (44), PCWP 28, Fick CO/CI: 3.1 / 1.5, PVR 5.3 WU, PA sat 42% & 48% e. ECHO (10/2012) EF 20-25%, diff HK, MV calcified annulus f. ECHO (03/2014) EF 30-35%, sev HK, inferior/inferoseptal walls, mild MR    Past Surgical History:  Procedure Laterality Date  . ABDOMINAL HYSTERECTOMY  1991  . BI-VENTRICULAR IMPLANTABLE CARDIOVERTER DEFIBRILLATOR N/A 11/10/2012   SJM Forify Assura single chamber ICD  . BREAST BIOPSY Left 08/2015   benign  . BREAST BIOPSY Left 06/2016   malignant  . BREAST LUMPECTOMY Left 07/24/2016   malignant  . BREAST LUMPECTOMY WITH RADIOACTIVE SEED AND SENTINEL LYMPH NODE BIOPSY Left 07/24/2016   Procedure: LEFT BREAST LUMPECTOMY WITH RADIOACTIVE SEED AND SENTINEL LYMPH NODE BIOPSY WITH BLUE DYE INJECTION;  Surgeon: Fanny Skates, MD;  Location: [facility];  Service: General;  Laterality: Left;  . CARDIAC CATHETERIZATION N/A 08/21/2015   Procedure: Right/Left Heart Cath and Coronary Angiography;  Surgeon: Jolaine Artist, MD;  Location: [facility];  Service: Cardiovascular;  Laterality: N/A;  . CARDIAC DEFIBRILLATOR PLACEMENT  11/10/2012   SJM Fortify Assura VR implanted by Dr Rayann Heman for primary prevention  . CHOLECYSTECTOMY    . COLONOSCOPY N/A 11/17/2015   Procedure: COLONOSCOPY;  Surgeon: Jerene Bears, MD;  Location: [facility] END[facility]COPY;  Service: Endoscopy;  Laterality: N/A;  . ENTEROSCOPY N/A 03/26/2016   Procedure: ENTEROSCOPY;  Surgeon: Manus Gunning, MD;  Location: [facility]OSCOPY;  Service:  Gastroenterology;  Laterality: N/A;  . ESOPHAGOGASTRODUODENOSCOPY N/A 11/16/2015   Procedure: ESOPHAGOGASTRODUODENOSCOPY (EGD);  Surgeon: Manus Gunning, MD;  Location: [facility];  Service: Gastroenterology;  Laterality: N/A;  . ESOPHAGOGASTRODUODENOSCOPY N/A 02/24/2016   Procedure: ESOPHAGOGASTRODUODENOSCOPY (EGD);  Surgeon: Milus Banister, MD;  Location: [facility];  Service: Endoscopy;  Laterality: N/A;  . ESOPHAGOGASTRODUODENOSCOPY N/A 03/26/2016   Procedure: ESOPHAGOGASTRODUODENOSCOPY (EGD);  Surgeon: Manus Gunning, MD;  Location: [facility];  Service: Gastroenterology;  Laterality: N/A;  . EYE SURGERY     cataracts  . GIVENS CAPSULE STUDY N/A 12/11/2015   Procedure: GIVENS CAPSULE STUDY;  Surgeon: Renelda Loma  Havery Moros, MD;  Location: [facility];  Service: Gastroenterology;  Laterality: N/A;  . KNEE CARTILAGE SURGERY    . MASTECTOMY     right  . RIGHT/LEFT HEART CATH AND CORONARY ANGIOGRAPHY N/A 01/28/2017   Procedure: Right/Left Heart Cath and Coronary Angiography;  Surgeon: Jolaine Artist, MD;  Location: [facility];  Service: Cardiovascular;  Laterality: N/A;  . WRIST SURGERY Bilateral     History  Smoking Status  . Former Smoker  . Packs/day: 1.50  . Years: 33.00  . Types: Cigarettes  . Quit date: 04/12/2001  Smokeless Tobacco  . Never Used    Comment: heaviest - 2.5- 3PPD x 3 years     History  Alcohol Use No    Social History   Social History  . Marital status: Divorced    Spouse name: N/A  . Number of children: 1  . Years of education: N/A   Occupational History  . Disabled Unemployed   Social History Main Topics  . Smoking status: Former Smoker    Packs/day: 1.50    Years: 33.00    Types: Cigarettes    Quit date: 04/12/2001  . Smokeless tobacco: Never Used     Comment: heaviest - 2.5- 3PPD x 3 years   . Alcohol use No  . Drug use: No  . Sexual activity: Not Currently   Other Topics Concern  . Not on file   Social  History Narrative   Lives with mother and brother Philis Nettle          Allergies  Allergen Reactions  . Bee Venom Anaphylaxis  . Aspirin Hives  . Ciprofloxacin Hives  . Codeine Nausea Only    "can take ONLY if she eats with med"  . Naproxen Hives  . Other Other (See Comments)    ANY TYPES OF METAL-CAUSES BLISTERS SKIN AND ITCHING   . Sulfonamide Derivatives Hives  . Xarelto [Rivaroxaban] Hives  . Atorvastatin Itching  . Nsaids Itching and Rash  . Tape Rash    Also allergic to metal  . Tyler Aas Flextouch [Insulin Degludec] Itching and Rash    Current Facility-Administered Medications  Medication Dose Route Frequency Provider Last Rate Last Dose  . 0.9 %  sodium chloride infusion  250 mL Intravenous PRN Bensimhon, Shaune Pascal, MD      . 0.9 %  sodium chloride infusion   Intravenous Continuous Bensimhon, Shaune Pascal, MD 10 mL/hr at 01/28/17 [phone]    . 0.9 %  sodium chloride infusion  250 mL Intravenous PRN Clegg, Amy D, NP      . 0.9 %  sodium chloride infusion  250 mL Intravenous PRN Bensimhon, Shaune Pascal, MD      . acetaminophen (TYLENOL) tablet 650 mg  650 mg Oral Q4H PRN Clegg, Amy D, NP   650 mg at 01/28/17 2141  . albuterol (PROVENTIL) (2.5 MG/3ML) 0.083% nebulizer solution 2.5 mg  2.5 mg Nebulization Q6H PRN Bensimhon, Shaune Pascal, MD      . ALPRAZolam Duanne Moron) tablet 0.25 mg  0.25 mg Oral QID PRN Clegg, Amy D, NP      . amiodarone (PACERONE) tablet 200 mg  200 mg Oral Daily Clegg, Amy D, NP   200 mg at 01/30/17 0835  . apixaban (ELIQUIS) tablet 5 mg  5 mg Oral BID Bensimhon, Shaune Pascal, MD   5 mg at 01/30/17 0835  . baclofen (LIORESAL) tablet 20 mg  20 mg Oral BID PRN Ninfa Meeker, Amy D, NP      .  busPIRone (BUSPAR) tablet 30 mg  30 mg Oral BID Clegg, Amy D, NP   30 mg at 01/30/17 0835  . digoxin (LANOXIN) tablet 0.125 mg  0.125 mg Oral QODAY Clegg, Amy D, NP   0.125 mg at 01/30/17 0837  . docusate sodium (COLACE) capsule 300 mg  300 mg Oral QHS Clegg, Amy D, NP   300 mg at 01/29/17 2141  .  DULoxetine (CYMBALTA) DR capsule 30 mg  30 mg Oral Daily Clegg, Amy D, NP   30 mg at 01/30/17 0836  . furosemide (LASIX) injection 80 mg  80 mg Intravenous BID Clegg, Amy D, NP   80 mg at 01/30/17 0834  . hydrocerin (EUCERIN) cream   Topical BID York, Marianne L, PA-C      . hydrOXYzine (ATARAX/VISTARIL) tablet 50 mg  50 mg Oral Q6H PRN Clegg, Amy D, NP      . insulin aspart (novoLOG) injection 0-15 Units  0-15 Units Subcutaneous TID WC Clegg, Amy D, NP   2 Units at 01/30/17 0836  . insulin aspart protamine- aspart (NOVOLOG MIX 70/30) injection 60 Units  60 Units Subcutaneous BID WC Clegg, Amy D, NP   60 Units at 01/30/17 0836  . ivabradine (CORLANOR) tablet 7.5 mg  7.5 mg Oral BID WC Jettie Booze E, NP   7.5 mg at 01/30/17 0834  . levothyroxine (SYNTHROID, LEVOTHROID) tablet 75 mcg  75 mcg Oral QAC breakfast Clegg, Amy D, NP   75 mcg at 01/30/17 0835  . milrinone (PRIMACOR) 20 MG/100 ML (0.2 mg/mL) infusion  0.25 mcg/kg/min Intravenous Continuous Clegg, Amy D, NP 7.2 mL/hr at 01/30/17 0734 0.25 mcg/kg/min at 01/30/17 0734  . ondansetron (ZOFRAN) injection 4 mg  4 mg Intravenous Q6H PRN Bensimhon, Shaune Pascal, MD      . pantoprazole (PROTONIX) EC tablet 40 mg  40 mg Oral BID Clegg, Amy D, NP   40 mg at 01/30/17 0835  . rOPINIRole (REQUIP) tablet 1 mg  1 mg Oral QHS,MR X 1 Clegg, Amy D, NP   1 mg at 01/29/17 2152  . rosuvastatin (CRESTOR) tablet 10 mg  10 mg Oral QHS Clegg, Amy D, NP   10 mg at 01/29/17 2141  . sodium chloride flush (NS) 0.9 % injection 3 mL  3 mL Intravenous Q12H Bensimhon, Shaune Pascal, MD   3 mL at 01/30/17 1000  . sodium chloride flush (NS) 0.9 % injection 3 mL  3 mL Intravenous PRN Bensimhon, Shaune Pascal, MD      . sodium chloride flush (NS) 0.9 % injection 3 mL  3 mL Intravenous Q12H Clegg, Amy D, NP   3 mL at 01/30/17 1000  . sodium chloride flush (NS) 0.9 % injection 3 mL  3 mL Intravenous PRN Clegg, Amy D, NP      . sodium chloride flush (NS) 0.9 % injection 3 mL  3 mL Intravenous Q12H  Bensimhon, Shaune Pascal, MD   3 mL at 01/30/17 0845  . sodium chloride flush (NS) 0.9 % injection 3 mL  3 mL Intravenous PRN Bensimhon, Shaune Pascal, MD      . SUMAtriptan (IMITREX) tablet 100 mg  100 mg Oral Daily PRN Clegg, Amy D, NP      . tiotropium (SPIRIVA) inhalation capsule 18 mcg  18 mcg Inhalation Daily Clegg, Amy D, NP   18 mcg at 01/29/17 1023  . traMADol (ULTRAM) tablet 50 mg  50 mg Oral Daily PRN Conrad Lincolnton, NP  Prescriptions Prior to Admission  Medication Sig Dispense Refill Last Dose  . acetaminophen (TYLENOL) 650 MG CR tablet Take 1,300 mg by mouth every 8 (eight) hours as needed for pain.   Past Month at Unknown time  . amiodarone (PACERONE) 200 MG tablet Take 1 tablet (200 mg total) by mouth daily. 90 tablet 3 01/28/2017 at 0415  . apixaban (ELIQUIS) 5 MG TABS tablet Take 1 tablet (5 mg total) by mouth 2 (two) times daily. 180 tablet 3 01/24/2017 at Unknown time  . busPIRone (BUSPAR) 30 MG tablet Take 30 mg by mouth 2 (two) times daily.    01/28/2017 at 0415  . Calcium Carb-Cholecalciferol (CALCIUM 600+D3) 600-800 MG-UNIT TABS Take by mouth daily.   01/28/2017 at [place]  . carvedilol (COREG) 6.25 MG tablet Take 1 tablet (6.25 mg total) by mouth 2 (two) times daily with a meal. 180 tablet 3 01/28/2017 at 0715  . Cholecalciferol (VITAMIN D) 2000 units CAPS Take 1 capsule by mouth daily.   01/27/2017 at 2130  . digoxin (LANOXIN) 0.125 MG tablet Take 1 tablet (0.125 mg total) by mouth every other day. 45 tablet 3 01/28/2017 at 0715  . docusate sodium (COLACE) 100 MG capsule Take 3 capsules (300 mg total) by mouth at bedtime. 10 capsule 0 01/27/2017 at 2130  . DULoxetine (CYMBALTA) 30 MG capsule Take 30 mg by mouth daily.   01/28/2017 at 0715  . furosemide (LASIX) 20 MG tablet Take 20-40 mg by mouth as directed. 40mg  in the AM and 40mg  in the PM. May take an additional 20mg  in the PM for fluid.   01/27/2017 at 1900  . Garlic 161 MG CAPS Take 500 mg by mouth daily after lunch.    01/27/2017 at  2130  . Glucos-Chondroit-Hyaluron-MSM (GLUCOSAMINE CHONDROITIN JOINT PO) Take 1 tablet by mouth daily.   01/27/2017 at 2130  . HUMALOG KWIKPEN 100 UNIT/ML KiwkPen Inject 14-24 Units into the skin 3 (three) times daily. On average per patient 24 unit with breakfast, 16 units with lunch, & 14-16 units with supper   01/27/2017 at 1900  . HUMALOG MIX 75/25 KWIKPEN (75-25) 100 UNIT/ML Kwikpen Inject 60 Units into the skin 2 (two) times daily.    01/27/2017 at 1900  . hydrOXYzine (ATARAX/VISTARIL) 50 MG tablet Take 50 mg by mouth every 6 (six) hours as needed for itching ((primarily taken at bedtime)).    01/27/2017 at 2130  . IRON PO Take 24 mg by mouth daily.   01/28/2017 at 0415  . ivabradine (CORLANOR) 5 MG TABS tablet Take 1.5 tablets (7.5 mg total) by mouth 2 (two) times daily with a meal. 270 tablet 3 01/28/2017 at 0415  . levothyroxine (SYNTHROID, LEVOTHROID) 75 MCG tablet Take 75 mcg by mouth daily before breakfast.    01/28/2017 at 0415  . Melatonin 1 MG TABS Take 0.5 mg by mouth at bedtime.   01/27/2017 at 2130  . mometasone (NASONEX) 50 MCG/ACT nasal spray Place 1 spray into both nostrils daily. allergies   Past Week at Unknown time  . Multiple Minerals (CALCIUM-MAGNESIUM-ZINC) TABS Take 1 tablet by mouth daily after lunch.    01/27/2017 at 1900  . nitroGLYCERIN (NITROSTAT) 0.4 MG SL tablet Place 1 tablet (0.4 mg total) under the tongue every 5 (five) minutes as needed. For chest pain 25 tablet 3 Past Week at Unknown time  . Omega-3 Fatty Acids (FISH OIL) 1000 MG CAPS Take 2 capsules by mouth daily.   01/27/2017 at 2130  . pantoprazole (PROTONIX) 40  MG tablet Take 40 mg by mouth 2 (two) times daily.     01/28/2017 at 0415  . polyethylene glycol powder (GLYCOLAX/MIRALAX) powder Mix 17 grams in 8 oz of water twice daily 850 g 3 Past Week at Unknown time  . potassium chloride SA (K-DUR,KLOR-CON) 20 MEQ tablet Take 4 tablets (80 mEq total) by mouth 2 (two) times daily. (Patient taking differently: Take 60-80  mEq by mouth See admin instructions. 60 MEQ IN T HE MORNING, AND 80 MEQ IN THE EVENING) 720 tablet 3 01/28/2017 at 0415  . Propylene Glycol (SYSTANE BALANCE) 0.6 % SOLN Place 1 drop into both eyes 3 (three) times daily.   Past Week at Unknown time  . pyridOXINE (VITAMIN B-6) 100 MG tablet Take 100 mg by mouth daily after lunch.    01/27/2017 at 2130  . rOPINIRole (REQUIP) 1 MG tablet Take 1 mg by mouth at bedtime and may repeat dose one time if needed. IF RESTLESS LEGS PERSIST PATIENT MAY REPEAT ADDITIONAL DOSE   01/27/2017 at 2130  . rosuvastatin (CRESTOR) 10 MG tablet Take 10 mg by mouth at bedtime.   01/27/2017 at 2130  . sacubitril-valsartan (ENTRESTO) 49-51 MG Take 1 tablet by mouth 2 (two) times daily. 60 tablet 3 01/28/2017 at 0415  . Simethicone Extra Strength 125 MG CAPS Take 1 tablet by mouth daily as needed (for bloating). Reported on 02/23/2016   Past Week at Unknown time  . spironolactone (ALDACTONE) 25 MG tablet Take 1 tablet (25 mg total) by mouth daily. 90 tablet 3 01/27/2017 at 1900  . tiotropium (SPIRIVA) 18 MCG inhalation capsule Place 18 mcg into inhaler and inhale daily.     Past Week at Unknown time  . vitamin B-12 (CYANOCOBALAMIN) 1000 MCG tablet Take 1,000 mcg by mouth daily after lunch.    01/27/2017 at 1900  . vitamin C (ASCORBIC ACID) 500 MG tablet Take 500 mg by mouth daily after lunch.    01/28/2017 at 0415  . vitamin E 400 UNIT capsule Take 400 Units by mouth daily after lunch.    01/27/2017 at 1900  . albuterol (PROVENTIL HFA) 108 (90 BASE) MCG/ACT inhaler Inhale 2 puffs into the lungs every 6 (six) hours as needed. For shortness of breath   More than a month at Unknown time  . ALPRAZolam (XANAX) 0.5 MG tablet Take 0.25 mg by mouth 4 (four) times daily as needed for anxiety.    More than a month at Unknown time  . baclofen (LIORESAL) 20 MG tablet Take 20 mg by mouth 2 (two) times daily as needed for muscle spasms.   More than a month at Unknown time  . metolazone (ZAROXOLYN) 2.5  MG tablet Take 1 tablet (2.5 mg total) by mouth as needed (for weight gain >3 lbs). 45 tablet 3 More than a month at Unknown time  . SUMAtriptan (IMITREX) 100 MG tablet Take 100 mg by mouth daily as needed for migraine.    More than a month at Unknown time  . traMADol (ULTRAM) 50 MG tablet Take 1 tablet by mouth daily as needed for moderate pain.    More than a month at Unknown time    Family History  Problem Relation Age of Onset  . Diabetes Mellitus II Mother   . Lung cancer Mother   . Congestive Heart Failure Mother   . Coronary artery disease Father        CABG x 3  . Hypertension Father   . Diabetes Mellitus II Father   .  Breast cancer Cousin 68       maternal first cousin  . Colon cancer Cousin 15       maternal first cousin  . Uterine cancer Cousin        dx in her 71s  . Colon cancer Maternal Aunt        dx in her late 53s  . Uterine cancer Maternal Aunt 66  . Lung cancer Maternal Aunt   . Uterine cancer Maternal Aunt 38  . Colon cancer Maternal Aunt 59  . Breast cancer Maternal Grandmother 53  . Uterine cancer Maternal Aunt 48  . Uterine cancer Cousin        dx in her 73s  . Uterine cancer Cousin 19     Review of Systems:       Cardiac Review of Systems: Y or N  Chest Pain [  No  ]  Resting SOB [  Yes ] Exertional SOB  [  yes]  Orthopnea [ y ]   Pedal Ede no ma [  yes ]    Palpitations [place]  ] Syncope  [no  ]   Presyncope [  no ]  General Review of Systems: [Y] = yes [  ]=no Constitional: recent weight change [  ]; anorexia [ yes ]; fatigue [ yes ]; nausea [  ]; night sweats [  ]; fever [  ]; or chills [  ]                                                               Dental: poor dentition[  ]; Last Dentist visit: Upper dental plates  Eye : blurred vision [  ]; diplopia [   ]; vision changes [  ];  Amaurosis fugax[  ]; Resp: cough [  ];  wheezing[  ];  hemoptysis[  ]; shortness of breath[  ]; paroxysmal nocturnal dyspnea[  ]; dyspnea on exertion[  ]; or  orthopnea[  ];  GI:  gallstones[  ], vomiting[  ];  dysphagia[  ]; melena[  ];  hematochezia [  ]; heartburn[  ];   Hx of  Colonoscopy[  ]; GU: kidney stones [  ]; hematuria[  ];   dysuria [  ];  nocturia[  ];  history of     obstruction [  ]; urinary frequency [  ]             Skin: rash, swelling[  ];, hair loss[  ];  peripheral edema[  ];  or itching[  ]; Musculosketetal: myalgias[  ];  joint swelling[  ];  joint erythema[  ];  joint pain[  ];  back pain[  ];  Heme/Lymph: bruising[  ];  bleeding[  ];  anemia[  ];  Neuro: TIA[  ];  headaches[  ];  stroke[  ];  vertigo[  ];  seizures[  ];   paresthesias[  ];  difficulty walking[  ];  Psych:depression[  ]; anxiety[  ];  Endocrine: diabetes[ yes ];  thyroid dysfunction[ yes  ];  Immunizations: Flu [  ]; Pneumococcal[  ];  Other: Right-hand dominant  Physical Exam: BP 106/71 (BP Location: Left Arm)   Pulse (!) 105   Temp 97.8 F (36.6 C) (Oral)   Resp 17   Ht  5\' 5"  (1.651 m)   Wt 209 lb 9.6 oz (95.1 kg) Comment: re-weighed standing  SpO2 99%   BMI 34.88 kg/m        Physical Exam  General: Short obese Caucasian female no acute distress alert and interactive HEENT: Normocephalic pupils equal , dentition adequate Neck: Supple without JVD, adenopathy, or bruit Chest: Clear to auscultation, symmetrical breath sounds, no rhonchi, no tenderness            Deformity present in anterior chest wall from previous mastectomy Cardiovascular: Regular rate and rhythm, no murmur,  +S3 gallop, peripheral pulses             palpable in all extremities Abdomen:  Soft, obese, nontender, no palpable mass or organomegaly Extremities: Warm, well-perfused, no clubbing cyanosis edema or tenderness,              no venous stasis changes of the legs Rectal/GU: Deferred Neuro: Grossly non--focal and symmetrical throughout Skin: Clean and dry without rash or ulceration   Diagnostic Studies & Laboratory data:     Recent Radiology Findings:   No  results found.   I have independently reviewed the above radiologic studies.  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 We'll proceed with evaluation.     @ME1 @ 01/30/2017 10:08 AM

## 2017-01-31 ENCOUNTER — Inpatient Hospital Stay (HOSPITAL_COMMUNITY): Payer: Medicare Other

## 2017-01-31 DIAGNOSIS — I480 Paroxysmal atrial fibrillation: Secondary | ICD-10-CM

## 2017-01-31 LAB — GLUCOSE, CAPILLARY
GLUCOSE-CAPILLARY: 171 mg/dL — AB (ref 65–99)
Glucose-Capillary: 149 mg/dL — ABNORMAL HIGH (ref 65–99)
Glucose-Capillary: 170 mg/dL — ABNORMAL HIGH (ref 65–99)
Glucose-Capillary: 176 mg/dL — ABNORMAL HIGH (ref 65–99)
Glucose-Capillary: 340 mg/dL — ABNORMAL HIGH (ref 65–99)

## 2017-01-31 LAB — BASIC METABOLIC PANEL
ANION GAP: 13 (ref 5–15)
Anion gap: 12 (ref 5–15)
Anion gap: 10 (ref 5–15)
BUN: 18 mg/dL (ref 6–20)
BUN: 16 mg/dL (ref 6–20)
BUN: 18 mg/dL (ref 6–20)
CALCIUM: 9.1 mg/dL (ref 8.9–10.3)
CALCIUM: 9.8 mg/dL (ref 8.9–10.3)
CALCIUM: 9.6 mg/dL (ref 8.9–10.3)
CO2: 31 mmol/L (ref 22–32)
CO2: 27 mmol/L (ref 22–32)
CO2: 24 mmol/L (ref 22–32)
CREATININE: 0.88 mg/dL (ref 0.44–1.00)
CREATININE: 0.87 mg/dL (ref 0.44–1.00)
Chloride: 89 mmol/L — ABNORMAL LOW (ref 101–111)
Chloride: 93 mmol/L — ABNORMAL LOW (ref 101–111)
Chloride: 92 mmol/L — ABNORMAL LOW (ref 101–111)
Creatinine, Ser: 0.89 mg/dL (ref 0.44–1.00)
GFR calc Af Amer: >60 mL/min (ref >60)
GFR calc non Af Amer: >60 mL/min (ref >60)
GLUCOSE: 268 mg/dL — AB (ref 65–99)
Glucose, Bld: 156 mg/dL — ABNORMAL HIGH (ref 65–99)
Glucose, Bld: 154 mg/dL — ABNORMAL HIGH (ref 65–99)
Potassium: 2.3 mmol/L — CL (ref 3.5–5.1)
Potassium: 3.6 mmol/L (ref 3.5–5.1)
Potassium: 4.4 mmol/L (ref 3.5–5.1)
Sodium: 132 mmol/L — ABNORMAL LOW (ref 135–145)
Sodium: 130 mmol/L — ABNORMAL LOW (ref 135–145)
Sodium: 129 mmol/L — ABNORMAL LOW (ref 135–145)

## 2017-01-31 LAB — CK TOTAL AND CKMB (NOT AT ARMC)
CK, MB: 2.7 ng/mL (ref 0.5–5.0)
CK, MB: 1.8 ng/mL (ref 0.5–5.0)
RELATIVE INDEX: INVALID (ref 0.0–2.5)
Relative Index: INVALID (ref 0.0–2.5)
Total CK: 36 U/L — ABNORMAL LOW (ref 38–234)
Total CK: 33 U/L — ABNORMAL LOW (ref 38–234)

## 2017-01-31 LAB — TROPONIN I
Troponin I: 0.03 ng/mL (ref <0.03)
Troponin I: <0.03 ng/mL (ref <0.03)

## 2017-01-31 LAB — COOXEMETRY PANEL
Carboxyhemoglobin: 1.1 % (ref 0.5–1.5)
Methemoglobin: 0.8 % (ref 0.0–1.5)
O2 Saturation: 44.3 %
Total hemoglobin: 15.4 g/dL (ref 12.0–16.0)

## 2017-01-31 LAB — MAGNESIUM: MAGNESIUM: 1.7 mg/dL (ref 1.7–2.4)

## 2017-01-31 MED ORDER — POTASSIUM CHLORIDE CRYS ER 20 MEQ PO TBCR
40.0000 meq | EXTENDED_RELEASE_TABLET | ORAL | Status: AC
  Administered 2017-01-31 (×4): 40 meq via ORAL
  Filled 2017-01-31: qty 2

## 2017-01-31 MED ORDER — AMIODARONE LOAD VIA INFUSION
150.0000 mg | Freq: Once | INTRAVENOUS | Status: AC
  Administered 2017-01-31: 150 mg via INTRAVENOUS
  Filled 2017-01-31: qty 83.34

## 2017-01-31 MED ORDER — SPIRONOLACTONE 25 MG PO TABS
12.5000 mg | ORAL_TABLET | Freq: Every day | ORAL | Status: DC
  Administered 2017-01-31 – 2017-02-01 (×2): 12.5 mg via ORAL
  Filled 2017-01-31 (×2): qty 1

## 2017-01-31 MED ORDER — AMIODARONE IV BOLUS ONLY 150 MG/100ML
150.0000 mg | Freq: Once | INTRAVENOUS | Status: AC
  Administered 2017-01-31: 150 mg via INTRAVENOUS

## 2017-01-31 MED ORDER — MAGNESIUM OXIDE 400 (241.3 MG) MG PO TABS
400.0000 mg | ORAL_TABLET | Freq: Two times a day (BID) | ORAL | Status: AC
  Administered 2017-01-31 – 2017-02-02 (×6): 400 mg via ORAL
  Filled 2017-01-31 (×6): qty 1

## 2017-01-31 MED ORDER — AMIODARONE HCL IN DEXTROSE 360-4.14 MG/200ML-% IV SOLN
60.0000 mg/h | INTRAVENOUS | Status: AC
  Administered 2017-01-31: 60 mg/h via INTRAVENOUS
  Filled 2017-01-31: qty 200

## 2017-01-31 MED ORDER — POTASSIUM CHLORIDE 10 MEQ/100ML IV SOLN
10.0000 meq | INTRAVENOUS | Status: AC
Start: 2017-01-31 — End: 2017-01-31
  Administered 2017-01-31 (×2): 10 meq via INTRAVENOUS
  Filled 2017-01-31 (×2): qty 100

## 2017-01-31 MED ORDER — MILRINONE LACTATE IN DEXTROSE 20-5 MG/100ML-% IV SOLN
0.2500 ug/kg/min | INTRAVENOUS | Status: DC
  Administered 2017-01-31 – 2017-02-03 (×5): 0.25 ug/kg/min via INTRAVENOUS
  Filled 2017-01-31 (×5): qty 100

## 2017-01-31 MED ORDER — FUROSEMIDE 10 MG/ML IJ SOLN
80.0000 mg | Freq: Two times a day (BID) | INTRAMUSCULAR | Status: DC
  Administered 2017-01-31 – 2017-02-05 (×12): 80 mg via INTRAVENOUS
  Filled 2017-01-31 (×12): qty 8

## 2017-01-31 MED ORDER — MAGNESIUM SULFATE 4 GM/100ML IV SOLN: 4.0000 g | Freq: Once | INTRAVENOUS | Status: DC

## 2017-01-31 MED ORDER — POTASSIUM CHLORIDE CRYS ER 20 MEQ PO TBCR
40.0000 meq | EXTENDED_RELEASE_TABLET | ORAL | Status: AC
  Administered 2017-01-31 (×2): 40 meq via ORAL
  Filled 2017-01-31 (×2): qty 2

## 2017-01-31 MED ORDER — AMIODARONE HCL IN DEXTROSE 360-4.14 MG/200ML-% IV SOLN
30.0000 mg/h | INTRAVENOUS | Status: DC
  Administered 2017-02-01 – 2017-02-07 (×13): 30 mg/h via INTRAVENOUS
  Filled 2017-01-31 (×15): qty 200

## 2017-01-31 NOTE — Progress Notes (Signed)
Responded to pt's HR reaching 140 while using the BSC. EKG confirmed pt was in A. Fib with RVR even at rest. Pt then c/o of sharp, consistent CP. 1 SL nitro administered. CP resolved after 1 tablet. On-call cardiologist paged for EKG findings. Orders received for cardiac enzymes to be drawn. Pt now resting comfortably. Will continue to monitor closely.  Jacqlyn Larsen, RN

## 2017-01-31 NOTE — Progress Notes (Signed)
CRITICAL VALUE ALERT  Critical Value:  Potassium 2.3, Trop 0.03  Date & Time Notied:  01/31/17 0520  Provider Notified: Cardiology On-Call, Dr. Harrell Gave  Orders Received/Actions taken: Order for IVPB potassium and PO potassium. Cycle cardiac enzymes.

## 2017-01-31 NOTE — Progress Notes (Signed)
Advanced Heart Failure Rounding Note  PCP:  Joanne Peace NP Primary Cardiologist: Dr [name] Endocrinologist: Dr [name] Oncologist: Dr [name] GI: Dr [name] Subjective:    Admitted 01/28/17 after [place], started on milrinone for reduced cardiac output, volume overload.   Weight down 7 pounds total.  Developed rapid afib RVR last night.  Feels tired, slightly SOB. Denies chest pain.   Creatinine 1.21->0.92->0.82->0.88.   Objective:   Weight Range: 204 lb 6.4 oz (92.7 kg) Body mass index is 34.01 kg/m.   Vital Signs:   Temp:  [97.7 F (36.5 C)-98.2 F (36.8 C)] 97.7 F (36.5 C) (06/29 0700) Pulse Rate:  [85-120] 91 (06/29 0700) Resp:  [11-22] 19 (06/29 0700) BP: (82-120)/(50-78) 111/77 (06/29 0700) SpO2:  [95 %-100 %] 96 % (06/29 0700) Weight:  [204 lb 6.4 oz (92.7 kg)] 204 lb 6.4 oz (92.7 kg) (06/29 0700) Last BM Date: 01/29/17  Weight change: Filed Weights   01/30/17 0428 01/30/17 0733 01/31/17 0700  Weight: 210 lb 4.8 oz (95.4 kg) 209 lb 9.6 oz (95.1 kg) 204 lb 6.4 oz (92.7 kg)    Intake/Output:   Intake/Output Summary (Last 24 hours) at 01/31/17 0754 Last data filed at 01/31/17 0700  Gross per 24 hour  Intake            696.4 ml  Output             6550 ml  Net          -5853.6 ml      Physical Exam    General: Elderly female. NAD.  HEENT: Normal Neck: Supple. JVP 5-6 cm. Carotids 2+ bilat; no bruits. No thyromegaly or nodule noted. Cor: PMI nondisplaced. Tachy, irregular rate and rhythm.  No M/G/R noted Lungs: Clear bilaterally, no wheezing.  Abdomen: Soft, non-tender, non-distended, no HSM. No bruits or masses. + bowel sounds.  Extremities: No cyanosis, clubbing, rash, trace pedal edema.  Neuro: A&O x4, cranial nerves grossly intact. moves all 4 extremities w/o difficulty. Affect pleasant    Telemetry   Atrial fib with RVR. 120-140 Personally reviewed    EKG    Atrial fib with RVR  Labs    Basic Metabolic Panel  Recent Labs  01/28/17 0918  01/29/17 0550 01/30/17 0439 01/31/17 0315  NA  --   < > 137 137 132*  K  --   < > 3.0* 3.8 2.3*  CL  --   < > 101 96* 89*  CO2  --   < > 28 29 31   GLUCOSE  --   < > 165* 90 156*  BUN  --   < > 17 15 18   CREATININE  --   < > 0.92 0.87 0.88  CALCIUM  --   < > 9.3 9.2 9.1  MG 1.6*  --  1.5*  --   --   < > = values in this interval not displayed. Liver Function Tests  Recent Labs  01/28/17 0918  AST 21  ALT 18  ALKPHOS 83  BILITOT 1.4*  PROT 6.9  ALBUMIN 3.3*    BNP: BNP (last 3 results)  Recent Labs  07/23/16 1200 12/21/16 1054  BNP 182.4* 474.9*    Hemoglobin A1C  Recent Labs  01/28/17 0922  HGBA1C 9.3*   Fasting Lipid Panel  Recent Labs  01/28/17 0921  CHOL 83  HDL 25*  LDLCALC 46  TRIG 58  CHOLHDL 3.3       Imaging   Right/Left Heart  Cath and Coronary Angiography 01/29/17 Findings:  Ao = 96/64 (76) LV = 96/37 RA =  20 RV =  54/21 PA =  59/30 (39) PCW = 33 Fick cardiac output/index = 3.1/1.6 PVR = 1.9 WU FA sat = 100% PA sat = 52%, 54% RA/PCWP = 0.61 PAPi = 1.45  Assessment:  1. Minimal non-obstructive CAD 2. Severe NICM with EF 10-15% 3. Elevated filling pressures with biventricular failure and severely reduced cardiac output   Plan/Discussion:  Admit for IV diuresis with milrinone support. Will need repeat RHC after she is tuned up to reassess VAD with regards to RV function.   Medications:     Scheduled Medications: . amiodarone  200 mg Oral Daily  . apixaban  5 mg Oral BID  . busPIRone  30 mg Oral BID  . digoxin  0.125 mg Oral QODAY  . docusate sodium  300 mg Oral QHS  . DULoxetine  30 mg Oral Daily  . furosemide  80 mg Intravenous BID  . hydrocerin   Topical BID  . insulin aspart  0-15 Units Subcutaneous TID WC  . insulin aspart protamine- aspart  60 Units Subcutaneous BID WC  . ivabradine  7.5 mg Oral BID WC  . levothyroxine  75 mcg Oral QAC breakfast  . pantoprazole  40 mg Oral BID  .  potassium chloride  40 mEq Oral Q4H  . rOPINIRole  1 mg Oral QHS,MR X 1  . rosuvastatin  10 mg Oral QHS  . sodium chloride flush  3 mL Intravenous Q12H  . sodium chloride flush  3 mL Intravenous Q12H  . sodium chloride flush  3 mL Intravenous Q12H  . tiotropium  18 mcg Inhalation Daily    Infusions: . sodium chloride    . sodium chloride 10 mL/hr at 01/28/17 0655  . sodium chloride    . sodium chloride    . milrinone 0.25 mcg/kg/min (01/30/17 2006)  . potassium chloride 10 mEq (01/31/17 0743)    PRN Medications: sodium chloride, sodium chloride, sodium chloride, acetaminophen, albuterol, ALPRAZolam, baclofen, hydrOXYzine, ondansetron (ZOFRAN) IV, sodium chloride flush, sodium chloride flush, sodium chloride flush, SUMAtriptan, traMADol    Patient Profile   [name] is a 61 year old with a history of R 2005/2008 and L 2017  breast cancer, HTN, DM, IBS, hyperlipidemia, chronic systolic heart failure, NICM chemo induced cardiomyopathy, St Jude ICD, and GI bleed S/B AVMs. R Breast cancer treatment with adriamycin in 2005. L breast cancer treatment with lumpectomy and radiation.   Assessment/Plan   1. A/C Systolic Heart Failure: NICM. Chemo induced cardiomyopathy. ECHO 12/2016 EF 15%. Admitted with marked volume overload.  - Stop milrinone with new rapid Afib.  - Continue IV diuresis today.  - Continue digoxin 0.125 mg.  - Stop corlanor now that she is in Afib.  - No beta blocker with decompensation  - Repeat RHC on Monday. Will likely need home milrinone, needs tunneled PICC in IR. No peripheral PICC with history of bilateral breast CA. Talked with IR, can do tunneled PICC this afternoon hopefully.  - She is being considered for LVAD for DT.  2. DM - Continue SSI 3. Hypothyroidism - TSH 4.661 - Continue synthroid 4. H/O R and L Breast Cancer: R breast treated with adriamycin 2005. L breast treated with lumpectomy and radiation.  - Multiple LN's and LUL mass concerning for  recurrent breast CA. Needs outpatient PET scan once she is d/c'd.  5. H/O GI Bleed- multiple scopes with most recent  03/2016. Has h/o polyps and AVMs.  - Denies melena and hematochezia 6. Atrial fibrillation with RVR  - Developed rapid Afib last night, start Amio gtt with load now.  7. OSA - Continue CPAP hs.  8. ICD - Stable.  9. H/O NSVT - On Amio as above.   10. Hypokalemia - K 2.3. Got 2 runs this morning already. Stat BMET now. Give KCl 96mEq every 30 min x 4 per Dr. Haroldine [name]  - Will add 12.5 mg Arlyce Harman.  Length of Stay: [place], NP  01/31/2017, 7:54 AM  Advanced Heart Failure Team Pager [phone] (M-F; 7a - 4p)  Please contact [facility] for night-coverage after hours (4p -7a ) and weekends on [email or web address]   Patient seen and examined with Jettie Booze, NP. We discussed all aspects of the encounter. I agree with the assessment and plan as stated above.   She developed rapid AF last night. Symptomatically worse. Will start IV amio. K also very low. Unfortunately not enough IV access to continue milrinone at this point. Not candidate for PICC with h/o bilateral breast CA. Will need central line today. Will continue IV diuresis. Make NPO after MN for possible DC-CV tomorrow, if needed. Renal function ok.   Glori Bickers, MD  5:21 PM

## 2017-01-31 NOTE — Care Management Note (Addendum)
Case Management Note  Patient Details  Name: [name] MRN: [id number] Date of Birth: 01/07/1956  Subjective/Objective:   Pt presented for Acute on Chronic CHF.  Initiate on IV Lasix and IV Milrinone. Pt continues on IV Amiodarone gtt. Unsure if will need Home Milrinone- CM will continue to monitor. Pt is from home alone, however has family support. Brother lives next door and her daughter lives two doors down. Pt states post LVAD she will stay with her sister in [place]. Pt has DME CPAP from [employer] and [employer].   Action/Plan: CM will continue to monitor for disposition needs.   Expected Discharge Date:                  Expected Discharge Plan: Home with [place].   In-House Referral:  NA  Discharge planning Services  CM Consult  Post Acute Care Choice: Home Health, Durable Medical Equipment Choice offered to: Patient    DME Arranged:  IV Pump/ Equipment DME Agency:  [place] Arranged:RN [facility] Agency:  [place]  Status of Service:  Completed, signed off  If discussed at [place] of Stay Meetings, dates discussed:  02-06-17   Additional Comments: 02-07-17 [place], RN,BSN [phone] CM did reach out to [facility] [place] in reference to home with IV Milrinone. No further needs from CM at this time.    1452 02-04-17 Plan for tunneled cath 02-04-17. Milrinone increased and weight down 3 lbs. Plan for [place] 02-06-17. Pt is being considered for LVAD- needs outpatient PET Scan. Palliative Care following as well. [facility] is following for Home Milrinone. CM did offer choice for [facility] RN Services and pt chose [facility] for services. Once stable for d/c pt will return to sisters home that is a Therapist, sports in [place] off [address]. Pt will need HHRN orders and F2F. No Further needs from CM at this time.   Bethena Roys, RN 01/31/2017, 3:23 PM

## 2017-01-31 NOTE — Plan of Care (Signed)
Problem: Cardiac: Goal: Ability to achieve and maintain adequate cardiopulmonary perfusion will improve Outcome: Not Progressing Pt has converted to A. Fib from SR as of 6/28. EKG confirms. No further interventions at this time as patient medication regime is appropriate. Will continue to monitor closely.

## 2017-01-31 NOTE — Procedures (Signed)
Central Venous Catheter Insertion Procedure Note MONSERRATT KNEZEVIC [id number] 1956/07/18  Procedure: Insertion of Central Venous Catheter Indications: Drug and/or fluid administration  Procedure Details Consent: Risks of procedure as well as the alternatives and risks of each were explained to the (patient/caregiver).  Consent for procedure obtained. Time Out: Verified patient identification, verified procedure, site/side was marked, verified correct patient position, special equipment/implants available, medications/allergies/relevent history reviewed, required imaging and test results available.  Performed  Maximum sterile technique was used including antiseptics, cap, gloves, gown, hand hygiene, mask and sheet. Skin prep: Chlorhexidine; local anesthetic administered A antimicrobial bonded/coated triple lumen catheter was placed in the right subclavian vein using the Seldinger technique.  Evaluation Blood flow good Complications: No apparent complications Patient did tolerate procedure well. Chest X-ray ordered to verify placement.  CXR: normal.  Glori Bickers MD 01/31/2017, 6:29 PM

## 2017-01-31 NOTE — Progress Notes (Signed)
IR aware of request for a tunneled PICC line today.  Due to urgent needs for access and our schedule that does not allow for this to be done today, we have recommended temporary central catheter access be obtained and then we can convert this to a tunneled PICC line on Monday, as originally planned.  I have spoken to [place], [place], about this and she understands.    Amberia Bayless E 9:00 AM 01/31/2017

## 2017-01-31 NOTE — Care Management Important Message (Signed)
Important Message  Patient Details  Name: [name] MRN: [id number] Date of Birth: 24-Jan-1956   Medicare Important Message Given:  Yes    Nathen May 01/31/2017, 10:42 AM

## 2017-02-01 LAB — BASIC METABOLIC PANEL
ANION GAP: 10 (ref 5–15)
Anion gap: 11 (ref 5–15)
BUN: 17 mg/dL (ref 6–20)
BUN: 16 mg/dL (ref 6–20)
CHLORIDE: 92 mmol/L — AB (ref 101–111)
CO2: 33 mmol/L — AB (ref 22–32)
CO2: 29 mmol/L (ref 22–32)
CREATININE: 1.05 mg/dL — AB (ref 0.44–1.00)
Calcium: 9.3 mg/dL (ref 8.9–10.3)
Calcium: 9.7 mg/dL (ref 8.9–10.3)
Chloride: 92 mmol/L — ABNORMAL LOW (ref 101–111)
Creatinine, Ser: 0.94 mg/dL (ref 0.44–1.00)
GFR calc Af Amer: >60 mL/min (ref >60)
GFR calc Af Amer: >60 mL/min (ref >60)
GFR calc non Af Amer: >60 mL/min (ref >60)
GFR, EST NON AFRICAN AMERICAN: 56 mL/min — AB (ref >60)
GLUCOSE: 98 mg/dL (ref 65–99)
GLUCOSE: 123 mg/dL — AB (ref 65–99)
POTASSIUM: 2.4 mmol/L — AB (ref 3.5–5.1)
Potassium: 3.3 mmol/L — ABNORMAL LOW (ref 3.5–5.1)
SODIUM: 132 mmol/L — AB (ref 135–145)
Sodium: 135 mmol/L (ref 135–145)

## 2017-02-01 LAB — GLUCOSE, CAPILLARY
GLUCOSE-CAPILLARY: 167 mg/dL — AB (ref 65–99)
GLUCOSE-CAPILLARY: 127 mg/dL — AB (ref 65–99)
Glucose-Capillary: 163 mg/dL — ABNORMAL HIGH (ref 65–99)
Glucose-Capillary: 336 mg/dL — ABNORMAL HIGH (ref 65–99)

## 2017-02-01 LAB — COOXEMETRY PANEL
Carboxyhemoglobin: 1.8 % — ABNORMAL HIGH (ref 0.5–1.5)
METHEMOGLOBIN: 0.7 % (ref 0.0–1.5)
O2 SAT: 59.5 %
TOTAL HEMOGLOBIN: 14.7 g/dL (ref 12.0–16.0)

## 2017-02-01 LAB — MAGNESIUM: MAGNESIUM: 1.7 mg/dL (ref 1.7–2.4)

## 2017-02-01 LAB — T4, FREE: Free T4: 1.07 ng/dL (ref 0.61–1.12)

## 2017-02-01 MED ORDER — POTASSIUM CHLORIDE CRYS ER 20 MEQ PO TBCR
80.0000 meq | EXTENDED_RELEASE_TABLET | Freq: Once | ORAL | Status: AC
  Administered 2017-02-01: 80 meq via ORAL
  Filled 2017-02-01: qty 4

## 2017-02-01 MED ORDER — SPIRONOLACTONE 25 MG PO TABS
25.0000 mg | ORAL_TABLET | Freq: Every day | ORAL | Status: DC
  Administered 2017-02-02 – 2017-02-07 (×6): 25 mg via ORAL
  Filled 2017-02-01 (×6): qty 1

## 2017-02-01 MED ORDER — POTASSIUM CHLORIDE 10 MEQ/50ML IV SOLN
10.0000 meq | INTRAVENOUS | Status: AC
  Administered 2017-02-01 (×4): 10 meq via INTRAVENOUS
  Filled 2017-02-01 (×4): qty 50

## 2017-02-01 MED ORDER — MAGNESIUM SULFATE 2 GM/50ML IV SOLN
2.0000 g | Freq: Once | INTRAVENOUS | Status: AC
  Administered 2017-02-01: 2 g via INTRAVENOUS
  Filled 2017-02-01: qty 50

## 2017-02-01 MED ORDER — POTASSIUM CHLORIDE CRYS ER 20 MEQ PO TBCR
40.0000 meq | EXTENDED_RELEASE_TABLET | ORAL | Status: AC
  Administered 2017-02-01 (×2): 40 meq via ORAL
  Filled 2017-02-01: qty 2

## 2017-02-01 MED ORDER — DEXTROSE 5 % IV SOLN
300.0000 mg | Freq: Once | INTRAVENOUS | Status: AC
  Administered 2017-02-01: 300 mg via INTRAVENOUS
  Filled 2017-02-01: qty 6

## 2017-02-01 MED ORDER — POTASSIUM CHLORIDE CRYS ER 20 MEQ PO TBCR
40.0000 meq | EXTENDED_RELEASE_TABLET | Freq: Once | ORAL | Status: AC
  Administered 2017-02-01: 40 meq via ORAL
  Filled 2017-02-01: qty 2

## 2017-02-01 NOTE — Progress Notes (Signed)
CRITICAL VALUE ALERT  Critical Value:  Potassium 2.4  Date & Time Notied:  02/01/2017 0631   Provider Notified: Dr. Harrell Gave, Cards Fellow  Orders Received/Actions taken: IVPB Potassium and PO potassium supplement. See orders.

## 2017-02-01 NOTE — Progress Notes (Signed)
Pt has home cpap at the bedside and does not require assistance to put it on. RT will continue to monitor.

## 2017-02-01 NOTE — Progress Notes (Signed)
Advanced Heart Failure Rounding Note  PCP:  [name] NP Primary Cardiologist: Dr Haroldine Laws Endocrinologist: Dr Loanne Drilling Oncologist: Dr Lindi Adie GI: Dr Havery Moros Subjective:    Admitted 01/28/17 after [place], started on milrinone for reduced cardiac output, volume overload.   Remains in AF with RVR despite amio.  Milrinone restarted due to low co-ox  Feels better today. SOB improved. No CP or palpitations.   Co-ox 59% CVP 11-12   Objective:   Weight Range: 92.7 kg (204 lb 4.8 oz) Body mass index is 34 kg/m.   Vital Signs:   Temp:  [97.4 F (36.3 C)-98 F (36.7 C)] 97.7 F (36.5 C) (06/30 1106) Pulse Rate:  [104-134] 115 (06/30 1106) Resp:  [13-29] 19 (06/30 1106) BP: (97-124)/(63-78) 106/63 (06/30 1108) SpO2:  [92 %-100 %] 94 % (06/30 1106) Weight:  [92.7 kg (204 lb 4.8 oz)] 92.7 kg (204 lb 4.8 oz) (06/30 0441) Last BM Date: 01/31/17  Weight change: Filed Weights   01/30/17 0733 01/31/17 0700 02/01/17 0441  Weight: 95.1 kg (209 lb 9.6 oz) 92.7 kg (204 lb 6.4 oz) 92.7 kg (204 lb 4.8 oz)    Intake/Output:   Intake/Output Summary (Last 24 hours) at 02/01/17 1321 Last data filed at 02/01/17 0447  Gross per 24 hour  Intake           984.47 ml  Output             1800 ml  Net          -815.53 ml      Physical Exam    General:  Sitting up in bed. No resp difficulty HEENT: normal Neck: supple. JVP to jaw  Carotids 2+ bilat; no bruits. No lymphadenopathy or thryomegaly appreciated. Cor: PMI nondisplaced. Irreg tach  2/6 TR Lungs: clear Abdomen: obese soft, nontender, nondistended. No hepatosplenomegaly. No bruits or masses. Good bowel sounds. Extremities: no cyanosis, clubbing, rash, 1+ edema Neuro: alert & orientedx3, cranial nerves grossly intact. moves all 4 extremities w/o difficulty. Affect pleasant   Telemetry   Atrial fib with RVR. 120-140 Personally reviewed     EKG    Atrial fib with RVR  Labs    Basic Metabolic Panel  Recent Labs  01/31/17 1142 01/31/17 1601 02/01/17 0439  NA 130* 129* 135  K 3.6 4.4 2.4*  CL 93* 92* 92*  CO2 27 24 33*  GLUCOSE 268* 154* 98  BUN 16 18 17   CREATININE 0.87 0.89 0.94  CALCIUM 9.8 9.6 9.3  MG 1.7  --  1.7   Liver Function Tests No results for input(s): AST, ALT, ALKPHOS, BILITOT, PROT, ALBUMIN in the last 72 hours.  BNP: BNP (last 3 results)  Recent Labs  07/23/16 1200 12/21/16 1054  BNP 182.4* 474.9*    Hemoglobin A1C No results for input(s): HGBA1C in the last 72 hours. Fasting Lipid Panel No results for input(s): CHOL, HDL, LDLCALC, TRIG, CHOLHDL, LDLDIRECT in the last 72 hours.     Imaging   Right/Left Heart Cath and Coronary Angiography 01/29/17 Findings:  Ao = 96/64 (76) LV = 96/37 RA =  20 RV =  54/21 PA =  59/30 (39) PCW = 33 Fick cardiac output/index = 3.1/1.6 PVR = 1.9 WU FA sat = 100% PA sat = 52%, 54% RA/PCWP = 0.61 PAPi = 1.45  Assessment:  1. Minimal non-obstructive CAD 2. Severe NICM with EF 10-15% 3. Elevated filling pressures with biventricular failure and severely reduced cardiac output   Plan/Discussion:  Admit  for IV diuresis with milrinone support. Will need repeat RHC after she is tuned up to reassess VAD with regards to RV function.   Medications:     Scheduled Medications: . apixaban  5 mg Oral BID  . busPIRone  30 mg Oral BID  . digoxin  0.125 mg Oral QODAY  . docusate sodium  300 mg Oral QHS  . DULoxetine  30 mg Oral Daily  . furosemide  80 mg Intravenous BID  . hydrocerin   Topical BID  . insulin aspart  0-15 Units Subcutaneous TID WC  . insulin aspart protamine- aspart  60 Units Subcutaneous BID WC  . levothyroxine  75 mcg Oral QAC breakfast  . magnesium oxide  400 mg Oral BID  . pantoprazole  40 mg Oral BID  . rOPINIRole  1 mg Oral QHS,MR X 1  . rosuvastatin  10 mg Oral QHS  . sodium chloride flush  3 mL Intravenous Q12H  . sodium chloride flush  3 mL Intravenous Q12H  . sodium chloride flush  3  mL Intravenous Q12H  . spironolactone  12.5 mg Oral Daily  . tiotropium  18 mcg Inhalation Daily    Infusions: . sodium chloride    . sodium chloride 10 mL/hr at 01/28/17 0655  . sodium chloride    . sodium chloride    . amiodarone 30 mg/hr (02/01/17 0426)  . magnesium sulfate 1 - 4 g bolus IVPB    . milrinone 0.25 mcg/kg/min (02/01/17 0808)    PRN Medications: sodium chloride, sodium chloride, sodium chloride, acetaminophen, albuterol, ALPRAZolam, baclofen, hydrOXYzine, ondansetron (ZOFRAN) IV, sodium chloride flush, sodium chloride flush, sodium chloride flush, SUMAtriptan, traMADol    Patient Profile   [name] is a 61 year old with a history of R 2005/2008 and L 2017  breast cancer, HTN, DM, IBS, hyperlipidemia, chronic systolic heart failure, NICM chemo induced cardiomyopathy, St Jude ICD, and GI bleed S/B AVMs. R Breast cancer treatment with adriamycin in 2005. L breast cancer treatment with lumpectomy and radiation.   Assessment/Plan   1. A/C Systolic Heart Failure: NICM. EF 15% s/p ICD Chemo induced cardiomyopathy. ECHO 12/2016 EF 15%. Admitted with marked volume overload and low output.  - Remains tenuous. Milrinone resumed last night after co-ox dropped. Will need home milrinone   - CVP 11-12 Continue IV diuresis today.  - Continue digoxin 0.125 mg.  - Will not tolerate AF with RVR long. Increase amio. Possible DC-CV in am - No beta blocker with decompensation  - Repeat RHC early next week. Will need home milrinone via tunneled RIJ PICC in IR.  - She is being considered for LVAD for DT.  2. DM - Continue SSI 3. Hypothyroidism - TSH 4.661 - Continue synthroid 4. H/O R and L Breast Cancer:  - R breast treated with adriamycin 2005. L breast treated with lumpectomy and radiation.  - Multiple LN's and LUL mass concerning for recurrent breast CA. Needs outpatient PET scan once she is d/c'd. I d/w Dr. Lindi Adie personally 5. H/O GI Bleed- multiple scopes with most recent  03/2016. Has h/o polyps and AVMs.  - Denies melena and hematochezia 6. Atrial fibrillation with RVR  - Remains fast. Continue IV amio and Eliquis. Will bolus amio today. Possible DC-CV in am.  7. OSA - Continue CPAP hs.  8. H/O NSVT - On Amio as above.   9. Hypokalemia - K 2.4. Will supp aggressively. Increase spiro.   Length of Stay: 4  Glori Bickers, MD  02/01/2017, 1:21 PM  Advanced Heart Failure Team Pager [phone] (M-F; 7a - 4p)  Please contact [facility] for night-coverage after hours (4p -7a ) and weekends on [email or web address]   Patient seen and examined with Joanne Booze, NP. We discussed all aspects of the encounter. I agree with the assessment and plan as stated above.   She developed rapid AF last night. Symptomatically worse. Will start IV amio. K also very low. Unfortunately not enough IV access to continue milrinone at this point. Not candidate for PICC with h/o bilateral breast CA. Will need central line today. Will continue IV diuresis. Make NPO after MN for possible DC-CV tomorrow, if needed. Renal function ok.   Glori Bickers, MD  1:21 PM

## 2017-02-01 NOTE — Progress Notes (Signed)
Patient's recent potassium 3.3.  RN paged Cardiology with this information.

## 2017-02-02 ENCOUNTER — Encounter (HOSPITAL_COMMUNITY)
Admission: AD | Disposition: A | Discharge status: Home Health | Payer: Self-pay | Source: Ambulatory Visit | Attending: Internal Medicine

## 2017-02-02 ENCOUNTER — Inpatient Hospital Stay (HOSPITAL_COMMUNITY): Payer: Medicare Other | Admitting: Certified Registered"

## 2017-02-02 DIAGNOSIS — E876 Hypokalemia: Secondary | ICD-10-CM

## 2017-02-02 HISTORY — PX: RADIOLOGY WITH ANESTHESIA: SHX6223

## 2017-02-02 LAB — BASIC METABOLIC PANEL
ANION GAP: 8 (ref 5–15)
BUN: 20 mg/dL (ref 6–20)
CALCIUM: 10 mg/dL (ref 8.9–10.3)
CO2: 31 mmol/L (ref 22–32)
Chloride: 92 mmol/L — ABNORMAL LOW (ref 101–111)
Creatinine, Ser: 1.1 mg/dL — ABNORMAL HIGH (ref 0.44–1.00)
GFR, EST NON AFRICAN AMERICAN: 53 mL/min — AB (ref >60)
Glucose, Bld: 185 mg/dL — ABNORMAL HIGH (ref 65–99)
Potassium: 3.9 mmol/L (ref 3.5–5.1)
Sodium: 131 mmol/L — ABNORMAL LOW (ref 135–145)

## 2017-02-02 LAB — GLUCOSE, CAPILLARY
GLUCOSE-CAPILLARY: 176 mg/dL — AB (ref 65–99)
GLUCOSE-CAPILLARY: 211 mg/dL — AB (ref 65–99)
GLUCOSE-CAPILLARY: 286 mg/dL — AB (ref 65–99)
Glucose-Capillary: 204 mg/dL — ABNORMAL HIGH (ref 65–99)

## 2017-02-02 LAB — COOXEMETRY PANEL
CARBOXYHEMOGLOBIN: 1.3 % (ref 0.5–1.5)
Methemoglobin: 0.9 % (ref 0.0–1.5)
O2 SAT: 48.8 %
Total hemoglobin: 14.6 g/dL (ref 12.0–16.0)

## 2017-02-02 SURGERY — RADIOLOGY WITH ANESTHESIA
Anesthesia: General

## 2017-02-02 MED ORDER — ETOMIDATE 2 MG/ML IV SOLN
INTRAVENOUS | Status: DC | PRN
  Administered 2017-02-02: 12 mg via INTRAVENOUS

## 2017-02-02 NOTE — Anesthesia Postprocedure Evaluation (Signed)
Anesthesia Post Note  Patient: [name]  Procedure(s) Performed: Procedure(s) (LRB): CARDIOVERSION (N/A)     Patient location during evaluation: PACU Anesthesia Type: General Level of consciousness: awake, awake and alert and oriented Pain management: pain level controlled Vital Signs Assessment: post-procedure vital signs reviewed and stable Respiratory status: spontaneous breathing, nonlabored ventilation and respiratory function stable Cardiovascular status: blood pressure returned to baseline and stable Postop Assessment: no headache Anesthetic complications: no    Last Vitals:  Vitals:   02/02/17 1500 02/02/17 2004  BP: 102/71 118/66  Pulse: (!) 109 (!) 110  Resp: 20 18  Temp: 36.7 C 37.1 C    Last Pain:  Vitals:   02/02/17 2004  TempSrc: Oral  PainSc:                  Glenford Garis COKER

## 2017-02-02 NOTE — Procedures (Signed)
    DIRECT CURRENT CARDIOVERSION  NAME:  [name]   MRN: [id number] DOB:  [birth year]   ADMIT DATE: 01/28/2017   INDICATIONS: Atrial fibrillation    PROCEDURE:   Informed consent was obtained prior to the procedure. The risks, benefits and alternatives for the procedure were discussed and the patient comprehended these risks. Once an appropriate time out was taken, the patient had the defibrillator pads placed in the anterior and posterior position. The patient then underwent sedation by the anesthesia service. Once an appropriate level of sedation was achieved, the patient received a single biphasic, synchronized 200J shock with prompt conversion to sinus rhythm. No apparent complications.   Glori Bickers, MD  2:27 PM

## 2017-02-02 NOTE — Plan of Care (Signed)
Problem: Education: Goal: Knowledge of [facility] Education information/materials will improve Outcome: Progressing Patient aware of plan of care.  RN provided medication education on all medications administered prior to administration.  Patient stated understanding and is able to explain to RN the purpose of most of her medications.

## 2017-02-02 NOTE — Anesthesia Preprocedure Evaluation (Signed)
Anesthesia Evaluation  Patient identified by MRN, date of birth, ID band Patient awake    Reviewed: Allergy & Precautions, NPO status , Patient's Chart, lab work & pertinent test results  Airway Mallampati: II       Dental   Pulmonary former smoker,     + decreased breath sounds      Cardiovascular hypertension,  Rhythm:Irregular Rate:Tachycardia     Neuro/Psych    GI/Hepatic   Endo/Other  diabetes  Renal/GU      Musculoskeletal   Abdominal (+) + obese,   Peds  Hematology   Anesthesia Other Findings   Reproductive/Obstetrics                             Anesthesia Physical Anesthesia Plan  ASA: III and emergent  Anesthesia Plan: General   Post-op Pain Management:    Induction: Intravenous  PONV Risk Score and Plan:   Airway Management Planned: Mask  Additional Equipment:   Intra-op Plan:   Post-operative Plan:   Informed Consent: I have reviewed the patients History and Physical, chart, labs and discussed the procedure including the risks, benefits and alternatives for the proposed anesthesia with the patient or authorized representative who has indicated his/her understanding and acceptance.     Plan Discussed with: CRNA and Anesthesiologist  Anesthesia Plan Comments:         Anesthesia Quick Evaluation

## 2017-02-02 NOTE — Progress Notes (Signed)
Advanced Heart Failure Rounding Note  PCP:  [name] NP Primary Cardiologist: Dr Haroldine Laws Endocrinologist: Dr Loanne Drilling Oncologist: Dr Lindi Adie GI: Dr Havery Moros Subjective:    Admitted 01/28/17 after [place], started on milrinone for reduced cardiac output, volume overload.   Remains in AF with RVR 130-140s. Co-ox dropping despite milrinone. Co-48%. CVP up to 15.   More sob and + orthopnea. Having tremors from amio.    Objective:   Weight Range: 93.2 kg (205 lb 8 oz) Body mass index is 34.2 kg/m.   Vital Signs:   Temp:  [97.4 F (36.3 C)-98.2 F (36.8 C)] 98.1 F (36.7 C) (07/01 0735) Pulse Rate:  [120-134] 127 (07/01 0735) Resp:  [18-29] 29 (07/01 0735) BP: (108-126)/(64-93) 114/93 (07/01 0735) SpO2:  [94 %-98 %] 98 % (07/01 0735) Weight:  [93.2 kg (205 lb 8 oz)] 93.2 kg (205 lb 8 oz) (07/01 0541) Last BM Date: 01/31/17  Weight change: Filed Weights   01/31/17 0700 02/01/17 0441 02/02/17 0541  Weight: 92.7 kg (204 lb 6.4 oz) 92.7 kg (204 lb 4.8 oz) 93.2 kg (205 lb 8 oz)    Intake/Output:   Intake/Output Summary (Last 24 hours) at 02/02/17 1226 Last data filed at 02/02/17 0900  Gross per 24 hour  Intake           1873.8 ml  Output             1850 ml  Net             23.8 ml      Physical Exam    CVP 15-17 General:  Sitting up in bed. Feels anxious HEENT: normal Neck: supple. JVP to ear Carotids 2+ bilat; no bruits. No lymphadenopathy or thryomegaly appreciated. Cor: PMI nondisplaced. IRR tachy +s3 2/6 TR RSC triple lumen Lungs: crackles at bases Abdomen: soft, nontender, nondistended. No hepatosplenomegaly. No bruits or masses. Good bowel sounds. Extremities: no cyanosis, clubbing, rash, 1+ edema Neuro: alert & orientedx3, cranial nerves grossly intact. moves all 4 extremities w/o difficulty. Affect pleasant   Telemetry   Atrial fib with RVR. 130-140 Personally reviewed    EKG    N/A  Labs    Basic Metabolic Panel  Recent Labs   01/31/17 1142  02/01/17 0439 02/01/17 2124 02/02/17 0500  NA 130*  < > 135 132* 131*  K 3.6  < > 2.4* 3.3* 3.9  CL 93*  < > 92* 92* 92*  CO2 27  < > 33* 29 31  GLUCOSE 268*  < > 98 123* 185*  BUN 16  < > 17 16 20   CREATININE 0.87  < > 0.94 1.05* 1.10*  CALCIUM 9.8  < > 9.3 9.7 10.0  MG 1.7  --  1.7  --   --   < > = values in this interval not displayed. Liver Function Tests No results for input(s): AST, ALT, ALKPHOS, BILITOT, PROT, ALBUMIN in the last 72 hours.  BNP: BNP (last 3 results)  Recent Labs  07/23/16 1200 12/21/16 1054  BNP 182.4* 474.9*    Hemoglobin A1C No results for input(s): HGBA1C in the last 72 hours. Fasting Lipid Panel No results for input(s): CHOL, HDL, LDLCALC, TRIG, CHOLHDL, LDLDIRECT in the last 72 hours.     Imaging   Right/Left Heart Cath and Coronary Angiography 01/29/17 Findings:  Ao = 96/64 (76) LV = 96/37 RA =  20 RV =  54/21 PA =  59/30 (39) PCW = 33 Fick cardiac output/index =  3.1/1.6 PVR = 1.9 WU FA sat = 100% PA sat = 52%, 54% RA/PCWP = 0.61 PAPi = 1.45  Assessment:  1. Minimal non-obstructive CAD 2. Severe NICM with EF 10-15% 3. Elevated filling pressures with biventricular failure and severely reduced cardiac output   Plan/Discussion:  Admit for IV diuresis with milrinone support. Will need repeat RHC after she is tuned up to reassess VAD with regards to RV function.   Medications:     Scheduled Medications: . apixaban  5 mg Oral BID  . busPIRone  30 mg Oral BID  . digoxin  0.125 mg Oral QODAY  . docusate sodium  300 mg Oral QHS  . DULoxetine  30 mg Oral Daily  . furosemide  80 mg Intravenous BID  . hydrocerin   Topical BID  . insulin aspart  0-15 Units Subcutaneous TID WC  . insulin aspart protamine- aspart  60 Units Subcutaneous BID WC  . levothyroxine  75 mcg Oral QAC breakfast  . magnesium oxide  400 mg Oral BID  . pantoprazole  40 mg Oral BID  . rOPINIRole  1 mg Oral QHS,MR X 1  .  rosuvastatin  10 mg Oral QHS  . sodium chloride flush  3 mL Intravenous Q12H  . sodium chloride flush  3 mL Intravenous Q12H  . sodium chloride flush  3 mL Intravenous Q12H  . spironolactone  25 mg Oral Daily  . tiotropium  18 mcg Inhalation Daily    Infusions: . sodium chloride    . sodium chloride 10 mL/hr at 01/28/17 0655  . sodium chloride    . sodium chloride    . amiodarone 30 mg/hr (02/02/17 0519)  . milrinone 0.25 mcg/kg/min (02/01/17 2241)    PRN Medications: sodium chloride, sodium chloride, sodium chloride, acetaminophen, albuterol, ALPRAZolam, baclofen, hydrOXYzine, ondansetron (ZOFRAN) IV, sodium chloride flush, sodium chloride flush, sodium chloride flush, SUMAtriptan, traMADol    Patient Profile   [name] is a 61 year old with a history of R 2005/2008 and L 2017  breast cancer, HTN, DM, IBS, hyperlipidemia, chronic systolic heart failure, NICM chemo induced cardiomyopathy, St Jude ICD, and GI bleed S/B AVMs. R Breast cancer treatment with adriamycin in 2005. L breast cancer treatment with lumpectomy and radiation.   Assessment/Plan   1. A/C Systolic Heart Failure: NICM. EF 15% s/p ICD Chemo induced cardiomyopathy. ECHO 12/2016 EF 15%. Admitted with marked volume overload and low output.  - She is worse today. Remains in AF with RVR and co-ox dropping and CVP up despite milrinone and IV lasix. - Will need DC-CV today. I have discussed with Dr. Linna Caprice and we will arrange for 2pm. Keep NPO - Remains on IV amio but is tremulous. Will need to switch to po soon,  - No beta blocker with decompensation  - Repeat RHC later this week. Will need home milrinone via tunneled RIJ PICC in IR.  - She is being considered for LVAD for DT.  2. DM - Continue SSI 3. Hypothyroidism - TSH 4.661 - Continue synthroid 4. H/O R and L Breast Cancer:  - R breast treated with adriamycin 2005. L breast treated with lumpectomy and radiation.  - Multiple LN's and LUL mass concerning for  recurrent breast CA. Needs outpatient PET scan once she is d/c'd. I d/w Dr. Lindi Adie personally 5. H/O GI Bleed- multiple scopes with most recent 03/2016. Has h/o polyps and AVMs.  - Denies melena and hematochezia. Hemoglobin stable 6. Atrial fibrillation with RVR  - As above.  Toelrating poorly. For DC-CV today. Continue IV amio and Eliquis. Will need to change amio to po soon due to side effects 7. OSA - Continue CPAP hs.  8. H/O NSVT - On Amio as above.   9. Hypokalemia - K 3.9  Continue to supp. On spiro.   Length of Stay: 5  Glori Bickers, MD  02/02/2017, 12:26 PM  Advanced Heart Failure Team Pager [phone] (M-F; 7a - 4p)  Please contact [facility] for night-coverage after hours (4p -7a ) and weekends on [email or web address]

## 2017-02-02 NOTE — Transfer of Care (Signed)
Immediate Anesthesia Transfer of Care Note  Patient: [name]  Procedure(s) Performed: Procedure(s): CARDIOVERSION (N/A)  Patient Location: PACU  Anesthesia Type:MAC  Level of Consciousness: awake, oriented, drowsy, patient cooperative and responds to stimulation  Airway & Oxygen Therapy: Patient Spontanous Breathing and Patient connected to nasal cannula oxygen  Post-op Assessment: Report given to RN and Post -op Vital signs reviewed and stable  Post vital signs: Reviewed and stable  Last Vitals:  Vitals:   02/02/17 1411 02/02/17 1437  BP: 99/65 109/78  Pulse:  95  Resp:  (!) 26  Temp:  36.9 C    Last Pain:  Vitals:   02/02/17 1145  TempSrc: Oral  PainSc:       Patients Stated Pain Goal: 0 (11/16/[birth year] 3837)  Complications: No apparent anesthesia complications

## 2017-02-03 ENCOUNTER — Encounter (HOSPITAL_COMMUNITY): Payer: Self-pay | Admitting: Radiology

## 2017-02-03 ENCOUNTER — Ambulatory Visit: Payer: Medicare Other | Admitting: Hematology and Oncology

## 2017-02-03 LAB — COOXEMETRY PANEL
Carboxyhemoglobin: 1.9 % — ABNORMAL HIGH (ref 0.5–1.5)
METHEMOGLOBIN: 0.6 % (ref 0.0–1.5)
O2 Saturation: 48.5 %
Total hemoglobin: 13.6 g/dL (ref 12.0–16.0)

## 2017-02-03 LAB — GLUCOSE, CAPILLARY
GLUCOSE-CAPILLARY: 188 mg/dL — AB (ref 65–99)
GLUCOSE-CAPILLARY: 90 mg/dL (ref 65–99)
Glucose-Capillary: 183 mg/dL — ABNORMAL HIGH (ref 65–99)

## 2017-02-03 LAB — BASIC METABOLIC PANEL
Anion gap: 8 (ref 5–15)
BUN: 25 mg/dL — AB (ref 6–20)
CALCIUM: 9.3 mg/dL (ref 8.9–10.3)
CO2: 31 mmol/L (ref 22–32)
Chloride: 93 mmol/L — ABNORMAL LOW (ref 101–111)
Creatinine, Ser: 1.09 mg/dL — ABNORMAL HIGH (ref 0.44–1.00)
GFR calc Af Amer: >60 mL/min (ref >60)
GFR, EST NON AFRICAN AMERICAN: 54 mL/min — AB (ref >60)
GLUCOSE: 156 mg/dL — AB (ref 65–99)
Potassium: 3.5 mmol/L (ref 3.5–5.1)
Sodium: 132 mmol/L — ABNORMAL LOW (ref 135–145)

## 2017-02-03 LAB — FACTOR 5 LEIDEN

## 2017-02-03 MED ORDER — METOLAZONE 5 MG PO TABS
5.0000 mg | ORAL_TABLET | Freq: Once | ORAL | Status: AC
  Administered 2017-02-03: 5 mg via ORAL
  Filled 2017-02-03: qty 1

## 2017-02-03 MED ORDER — POTASSIUM CHLORIDE CRYS ER 20 MEQ PO TBCR
40.0000 meq | EXTENDED_RELEASE_TABLET | Freq: Two times a day (BID) | ORAL | Status: DC
  Administered 2017-02-03 (×2): 40 meq via ORAL
  Filled 2017-02-03: qty 4
  Filled 2017-02-03: qty 2

## 2017-02-03 MED ORDER — MILRINONE LACTATE IN DEXTROSE 20-5 MG/100ML-% IV SOLN
0.3750 ug/kg/min | INTRAVENOUS | Status: DC
  Administered 2017-02-03 – 2017-02-07 (×10): 0.375 ug/kg/min via INTRAVENOUS
  Filled 2017-02-03 (×11): qty 100

## 2017-02-03 MED ORDER — APIXABAN 5 MG PO TABS
5.0000 mg | ORAL_TABLET | Freq: Two times a day (BID) | ORAL | Status: DC
  Administered 2017-02-05 – 2017-02-07 (×4): 5 mg via ORAL
  Filled 2017-02-03 (×4): qty 1

## 2017-02-03 NOTE — Progress Notes (Signed)
Initial Nutrition Assessment  DOCUMENTATION CODES:   Obesity unspecified  INTERVENTION:    Continue HS snack daily  NUTRITION DIAGNOSIS:   Increased nutrient needs related to chronic illness (HF) as evidenced by estimated needs.  Ongoing  GOAL:   Patient will meet greater than or equal to 90% of their needs   Met  MONITOR:   PO intake, Labs, I & O's  ASSESSMENT:   61 yo female with PMH of R&L breast CA, HTN, DM, IBS, HLD, HF, chemo induced cardiomyopathy who was admitted on 6/26 with acute on chronic systolic heart failure.   S/P cardioversion 7/1. PO intake remains good, patient is consuming 75-100% of meals plus HS snack daily.  Labs reviewed: sodium 132 (L) CBG's: 211-286-183 Medications reviewed and include Colace, Lasix, KCl.  Diet Order:  Diet Heart Room service appropriate? Yes; Fluid consistency: Thin  Skin:  Reviewed, no issues  Last BM:  6/29  Height:   Ht Readings from Last 1 Encounters:  01/28/17 '5\' 5"'$  (1.651 m)    Weight:   Wt Readings from Last 1 Encounters:  02/03/17 207 lb 8 oz (94.1 kg)   01/29/17 206 lb (93.4 kg)    Ideal Body Weight:  56.8 kg  BMI:  Body mass index is 34.53 kg/m.  Estimated Nutritional Needs:   Kcal:  1700-1900  Protein:  90-110 gm  Fluid:  1.7-1.9 L  EDUCATION NEEDS:   No education needs identified at this time  Molli Barrows, [place], [place], [place] Pager [phone] After Hours Pager [phone]

## 2017-02-03 NOTE — Plan of Care (Signed)
Problem: Education: Goal: Knowledge of [facility] Education information/materials will improve Outcome: Progressing Patient aware of plan of care.  RN and patient discussed scheduled medications.  Patient able to explain to RN purpose of each medication.

## 2017-02-03 NOTE — Consult Note (Signed)
   [facility] CM Inpatient Consult   02/03/2017  [name] March 19, 1956 [id number]    [facility] Care Management follow up.   Ms. Cassar was active with [facility] Management program prior to hospitalization. She was followed by [facility].  Spoke with inpatient RNCM prior to bedside visit.   Went to bedside. However, Ms. Makepeace was sleeping soundly.  Will follow up at later time. Will make appropriate [facility] Care Management referral after speaking with Ms. Lorne Skeens.  Marthenia Rolling, MSN-Ed, RN,BSN [facility] Liaison [phone]

## 2017-02-03 NOTE — Progress Notes (Signed)
Advanced Heart Failure Rounding Note  PCP:  [name] NP Primary Cardiologist: Dr Haroldine Laws Endocrinologist: Dr Loanne Drilling Oncologist: Dr Lindi Adie GI: Dr Havery Moros Subjective:    Admitted 01/28/17 after [place], started on milrinone for reduced cardiac output, volume overload.   Developed Afib RVR, s/p DCCV 02/02/17.  Co Ox 49% on 0.25 mcg milrinone.   Weight up 2 pounds overnight. CVP 16-17. Feels much better today, more energy. Denies chest pain, palpitations, and SOB.   Objective:   Weight Range: 207 lb 8 oz (94.1 kg) Body mass index is 34.53 kg/m.   Vital Signs:   Temp:  [97.6 F (36.4 C)-98.7 F (37.1 C)] 98.7 F (37.1 C) (07/02 0416) Pulse Rate:  [95-130] 109 (07/02 0416) Resp:  [17-29] 20 (07/02 0416) BP: (99-118)/(65-93) 110/65 (07/02 0416) SpO2:  [90 %-99 %] 94 % (07/02 0416) Weight:  [207 lb 8 oz (94.1 kg)] 207 lb 8 oz (94.1 kg) (07/02 0416) Last BM Date: 01/31/17  Weight change: Filed Weights   02/01/17 0441 02/02/17 0541 02/03/17 0416  Weight: 204 lb 4.8 oz (92.7 kg) 205 lb 8 oz (93.2 kg) 207 lb 8 oz (94.1 kg)    Intake/Output:   Intake/Output Summary (Last 24 hours) at 02/03/17 0714 Last data filed at 02/03/17 0423  Gross per 24 hour  Intake            723.5 ml  Output             2050 ml  Net          -1326.5 ml      Physical Exam    CVP 16-17 General:  Sitting on edge of bed coloring. NAD.  HEENT: normal Neck: Supple, JVP to ear. Carotids 2+ bilat; no bruits. No lymphadenopathy or thryomegaly appreciated. Cor: PMI nondisplaced. Tachycardiac, regular rhythm. +S3.  2/6 TR [place] TL.  Lungs: Clear in upper lobes, crackles in bilateral bases.  Abdomen: Soft, nontender, nondistended. No hepatosplenomegaly. No bruits or masses. Good bowel sounds. Extremities: no cyanosis, clubbing, rash. 1+ pedal edema.  Neuro: alert & orientedx3, cranial nerves grossly intact. moves all 4 extremities w/o difficulty. Affect pleasant    Telemetry   Sinus tach rates in  100's.     EKG    n/a  Labs    Basic Metabolic Panel  Recent Labs  01/31/17 1142  02/01/17 0439  02/02/17 0500 02/03/17 0451  NA 130*  < > 135  < > 131* 132*  K 3.6  < > 2.4*  < > 3.9 3.5  CL 93*  < > 92*  < > 92* 93*  CO2 27  < > 33*  < > 31 31  GLUCOSE 268*  < > 98  < > 185* 156*  BUN 16  < > 17  < > 20 25*  CREATININE 0.87  < > 0.94  < > 1.10* 1.09*  CALCIUM 9.8  < > 9.3  < > 10.0 9.3  MG 1.7  --  1.7  --   --   --   < > = values in this interval not displayed. Liver Function Tests No results for input(s): AST, ALT, ALKPHOS, BILITOT, PROT, ALBUMIN in the last 72 hours.  BNP: BNP (last 3 results)  Recent Labs  07/23/16 1200 12/21/16 1054  BNP 182.4* 474.9*        Imaging   Right/Left Heart Cath and Coronary Angiography 01/29/17 Findings:  Ao = 96/64 (76) LV = 96/37 RA =  20  RV =  34/21 PA =  59/30 (39) PCW = 33 Fick cardiac output/index = 3.1/1.6 PVR = 1.9 WU FA sat = 100% PA sat = 52%, 54% RA/PCWP = 0.61 PAPi = 1.45  Assessment:  1. Minimal non-obstructive CAD 2. Severe NICM with EF 10-15% 3. Elevated filling pressures with biventricular failure and severely reduced cardiac output   Plan/Discussion:  Admit for IV diuresis with milrinone support. Will need repeat RHC after she is tuned up to reassess VAD with regards to RV function.   Medications:     Scheduled Medications: . apixaban  5 mg Oral BID  . busPIRone  30 mg Oral BID  . digoxin  0.125 mg Oral QODAY  . docusate sodium  300 mg Oral QHS  . DULoxetine  30 mg Oral Daily  . furosemide  80 mg Intravenous BID  . hydrocerin   Topical BID  . insulin aspart  0-15 Units Subcutaneous TID WC  . insulin aspart protamine- aspart  60 Units Subcutaneous BID WC  . levothyroxine  75 mcg Oral QAC breakfast  . pantoprazole  40 mg Oral BID  . rOPINIRole  1 mg Oral QHS,MR X 1  . rosuvastatin  10 mg Oral QHS  . sodium chloride flush  3 mL Intravenous Q12H  . sodium chloride flush   3 mL Intravenous Q12H  . sodium chloride flush  3 mL Intravenous Q12H  . spironolactone  25 mg Oral Daily  . tiotropium  18 mcg Inhalation Daily    Infusions: . sodium chloride    . sodium chloride 10 mL/hr at 01/28/17 0655  . sodium chloride    . sodium chloride    . amiodarone 30 mg/hr (02/03/17 0427)  . milrinone 0.25 mcg/kg/min (02/03/17 0427)    PRN Medications: sodium chloride, sodium chloride, sodium chloride, acetaminophen, albuterol, ALPRAZolam, baclofen, hydrOXYzine, ondansetron (ZOFRAN) IV, sodium chloride flush, sodium chloride flush, sodium chloride flush, SUMAtriptan, traMADol    Patient Profile   Ms Seim is a 61 year old with a history of R 2005/2008 and L 2017  breast cancer, HTN, DM, IBS, hyperlipidemia, chronic systolic heart failure, NICM chemo induced cardiomyopathy, St Jude ICD, and GI bleed S/B AVMs. R Breast cancer treatment with adriamycin in 2005. L breast cancer treatment with lumpectomy and radiation.   Assessment/Plan   1. A/C Systolic Heart Failure: NICM. EF 15% s/p ICD Chemo induced cardiomyopathy. ECHO 12/2016 EF 15%. Admitted with marked volume overload and low output.  - Co ox 49% on .025 mcg milrinone. CVP remains high at 16-17.  - Weight up overnight, she only got one dose of lasix yesterday due to procedure (DCCV).  - Give 80mg  BID IV lasix today and 5mg  metolazone.  - No beta blocker with decompensation.  - Repeat RHC later this week. Will need home milrinone via tunneled RIJ PICC in IR.  - She is being considered for LVAD for DT.  - Continue digoxin 0.125 mg - Continue Spiro 25 mg daily.  2. DM - Continue SSI  3. Hypothyroidism - TSH 4.661 - Continue synthroid.  4. H/O R and L Breast Cancer:  - R breast treated with adriamycin 2005. L breast treated with lumpectomy and radiation.  - Multiple LN's and LUL mass concerning for recurrent breast CA. Needs outpatient PET scan once she is d/c'd.  - Dr. Haroldine Laws discussed with Dr. Lindi Adie.  No change to current plan.  5. H/O GI Bleed- multiple scopes with most recent 03/2016. Has h/o polyps and AVMs.  -  No further.  6. Atrial fibrillation with RVR  - s/p DCCV, remains in NSR/Sinus tach today.  - Transition to po Amio likely tomorrow.  7. OSA - Continue CPAP hs.  8. H/O NSVT - Continue Amiodarone.   9. Hypokalemia - K 3.5. Will give 83mEq BID today with IV lasix and metolazone.   Length of Stay: [place], NP  02/03/2017, 7:14 AM  Advanced Heart Failure Team Pager [phone] (M-F; [place])  Please contact [facility] for night-coverage after hours (4p -7a ) and weekends on [email or web address]   Patient seen and examined with Jettie Booze, NP. We discussed all aspects of the encounter. I agree with the assessment and plan as stated above.   S/p DC-CV yesterday. Maintaining NSR. Continue amio and eliquis.  Co-ox low on milrinone 0.25. Will increase to 0.375.. Agree with pushing diuresis. Will need repeat RHC and home with milrinone when more stable. Supp K.   Glori Bickers, MD  8:14 AM

## 2017-02-04 ENCOUNTER — Encounter (HOSPITAL_COMMUNITY): Payer: Self-pay | Admitting: Interventional Radiology

## 2017-02-04 ENCOUNTER — Inpatient Hospital Stay (HOSPITAL_COMMUNITY): Payer: Medicare Other

## 2017-02-04 HISTORY — PX: IR FLUORO GUIDE CV LINE RIGHT: IMG2283

## 2017-02-04 HISTORY — PX: IR US GUIDE VASC ACCESS RIGHT: IMG2390

## 2017-02-04 LAB — BASIC METABOLIC PANEL
ANION GAP: 9 (ref 5–15)
ANION GAP: 9 (ref 5–15)
BUN: 26 mg/dL — AB (ref 6–20)
BUN: 28 mg/dL — ABNORMAL HIGH (ref 6–20)
CALCIUM: 9.5 mg/dL (ref 8.9–10.3)
CALCIUM: 9.8 mg/dL (ref 8.9–10.3)
CO2: 35 mmol/L — ABNORMAL HIGH (ref 22–32)
CO2: 31 mmol/L (ref 22–32)
Chloride: 88 mmol/L — ABNORMAL LOW (ref 101–111)
Chloride: 93 mmol/L — ABNORMAL LOW (ref 101–111)
Creatinine, Ser: 0.99 mg/dL (ref 0.44–1.00)
Creatinine, Ser: 1.11 mg/dL — ABNORMAL HIGH (ref 0.44–1.00)
GFR calc Af Amer: >60 mL/min (ref >60)
GFR, EST NON AFRICAN AMERICAN: 52 mL/min — AB (ref >60)
GLUCOSE: 172 mg/dL — AB (ref 65–99)
Glucose, Bld: 106 mg/dL — ABNORMAL HIGH (ref 65–99)
POTASSIUM: 2.3 mmol/L — AB (ref 3.5–5.1)
Potassium: 3.6 mmol/L (ref 3.5–5.1)
SODIUM: 132 mmol/L — AB (ref 135–145)
Sodium: 133 mmol/L — ABNORMAL LOW (ref 135–145)

## 2017-02-04 LAB — COOXEMETRY PANEL
Carboxyhemoglobin: 1.9 % — ABNORMAL HIGH (ref 0.5–1.5)
Methemoglobin: 0.6 % (ref 0.0–1.5)
O2 Saturation: 59.8 %
Total hemoglobin: 14.2 g/dL (ref 12.0–16.0)

## 2017-02-04 LAB — GLUCOSE, CAPILLARY
GLUCOSE-CAPILLARY: 126 mg/dL — AB (ref 65–99)
GLUCOSE-CAPILLARY: 185 mg/dL — AB (ref 65–99)
GLUCOSE-CAPILLARY: 224 mg/dL — AB (ref 65–99)
Glucose-Capillary: 248 mg/dL — ABNORMAL HIGH (ref 65–99)

## 2017-02-04 LAB — MAGNESIUM: Magnesium: 1.8 mg/dL (ref 1.7–2.4)

## 2017-02-04 MED ORDER — METOLAZONE 5 MG PO TABS
5.0000 mg | ORAL_TABLET | Freq: Once | ORAL | Status: AC
  Administered 2017-02-04: 5 mg via ORAL
  Filled 2017-02-04: qty 1

## 2017-02-04 MED ORDER — IVABRADINE HCL 5 MG PO TABS
2.5000 mg | ORAL_TABLET | Freq: Two times a day (BID) | ORAL | Status: DC
  Administered 2017-02-04 – 2017-02-07 (×7): 2.5 mg via ORAL
  Filled 2017-02-04 (×8): qty 1

## 2017-02-04 MED ORDER — POTASSIUM CHLORIDE CRYS ER 20 MEQ PO TBCR
80.0000 meq | EXTENDED_RELEASE_TABLET | Freq: Once | ORAL | Status: AC
  Administered 2017-02-04: 80 meq via ORAL
  Filled 2017-02-04: qty 8

## 2017-02-04 MED ORDER — LIDOCAINE HCL 1 % IJ SOLN
INTRAMUSCULAR | Status: DC | PRN
  Administered 2017-02-04: 8 mL

## 2017-02-04 MED ORDER — LIDOCAINE HCL (PF) 1 % IJ SOLN
INTRAMUSCULAR | Status: AC
  Filled 2017-02-04: qty 30

## 2017-02-04 MED ORDER — POTASSIUM CHLORIDE 10 MEQ/50ML IV SOLN
10.0000 meq | INTRAVENOUS | Status: AC
  Administered 2017-02-04 (×4): 10 meq via INTRAVENOUS
  Filled 2017-02-04 (×4): qty 50

## 2017-02-04 NOTE — Progress Notes (Signed)
Second page sent to Cardiology pertaining to patient's morning potassium. Dr. Eula Fried returned page, order received.

## 2017-02-04 NOTE — Care Management Important Message (Signed)
Important Message  Patient Details  Name: [name] MRN: [id number] Date of Birth: 1956/05/04   Medicare Important Message Given:  Yes    Nathen May 02/04/2017, 10:23 AM

## 2017-02-04 NOTE — Progress Notes (Signed)
Visited with Audrena.  Spoke about Afib and cardioversion.  She described the upcoming events with right heart cath and then PET scan after D/C.  Thru questioning I was able to learn that Emylee understands if the PET comes back positive for cancer recurrence she will not be a VAD candidate.  She told me she was not going to dwell and worry about that - 1 day at a time.    She is currently NPO for PICC placement and is looking forward to receiving her PICC, having her RHC and going home.  She speaks very highly of Dr. Linna Hoff and the HF team.  We will continue to check in occasionally for support while she is in the hospital.  Florentina Jenny, PA-C Palliative Medicine Pager: [phone]   Total time:  15 min.

## 2017-02-04 NOTE — Progress Notes (Signed)
Advanced Heart Failure Rounding Note  PCP:  Joanne Peace [name] Primary Cardiologist: Dr [name] Endocrinologist: Dr [name] Oncologist: Dr [name] GI: Dr [name] Subjective:    Admitted 01/28/17 after [place], started on milrinone for reduced cardiac output, volume overload.   Developed Afib RVR, s/p DCCV 02/02/17.  Milrinone increased to 0.375 mcg yesterday, co ox 60%.   Weight down 3 pounds. CVP 12 . Feels tired today, denies SOB and chest pain.   Objective:   Weight Range: 204 lb 9.6 oz (92.8 kg) Body mass index is 34.05 kg/m.   Vital Signs:   Temp:  [97.5 F (36.4 C)-98.6 F (37 C)] 97.5 F (36.4 C) (07/03 0025) Pulse Rate:  [104-116] 116 (07/03 0433) Resp:  [16-24] 23 (07/03 0433) BP: (90-115)/(58-93) 112/93 (07/03 0433) SpO2:  [92 %-98 %] 95 % (07/03 0433) Weight:  [204 lb 9.6 oz (92.8 kg)] 204 lb 9.6 oz (92.8 kg) (07/03 0428) Last BM Date: 01/31/17  Weight change: Filed Weights   02/02/17 0541 02/03/17 0416 02/04/17 0428  Weight: 205 lb 8 oz (93.2 kg) 207 lb 8 oz (94.1 kg) 204 lb 9.6 oz (92.8 kg)    Intake/Output:   Intake/Output Summary (Last 24 hours) at 02/04/17 0729 Last data filed at 02/04/17 0548  Gross per 24 hour  Intake           1938.6 ml  Output             3550 ml  Net          -1611.4 ml      Physical Exam    CVP 12 General: Elderly appearing female, NAD. Lying in bed.  HEENT: Normal Neck: Supple. JVP to jaw. Carotids 2+ bilat; no bruits. No thyromegaly or nodule noted. Cor: PMI nondisplaced. Tachy, regular rhythm. 2/6 TR murmur. [name].  Lungs: CTAB, normal effort. Abdomen: Soft, non-tender, non-distended, no HSM. No bruits or masses. +BS  Extremities: No cyanosis, clubbing, rash, Trace pedal edema bilaterally.  Neuro: Alert & orientedx3, cranial nerves grossly intact. moves all 4 extremities w/o difficulty. Affect pleasant    Telemetry    Sinus tach 100's.    EKG    n/a  Labs    Basic Metabolic Panel  Recent Labs  02/03/17 0451 02/04/17 0426  NA 132* 132*  K 3.5 2.3*  CL 93* 88*  CO2 31 35*  GLUCOSE 156* 106*  BUN 25* 26*  CREATININE 1.09* 0.99  CALCIUM 9.3 9.5     BNP: BNP (last 3 results)  Recent Labs  07/23/16 1200 12/21/16 1054  BNP 182.4* 474.9*        Imaging   Right/Left Heart Cath and Coronary Angiography 01/29/17 Findings:  Ao = 96/64 (76) LV = 96/37 RA =  20 RV =  54/21 PA =  59/30 (39) PCW = 33 Fick cardiac output/index = 3.1/1.6 PVR = 1.9 WU FA sat = 100% PA sat = 52%, 54% RA/PCWP = 0.61 PAPi = 1.45  Assessment:  1. Minimal non-obstructive CAD 2. Severe NICM with EF 10-15% 3. Elevated filling pressures with biventricular failure and severely reduced cardiac output   Plan/Discussion:  Admit for IV diuresis with milrinone support. Will need repeat RHC after she is tuned up to reassess VAD with regards to RV function.   Medications:     Scheduled Medications: . [START ON 02/05/2017] apixaban  5 mg Oral BID  . busPIRone  30 mg Oral BID  . digoxin  0.125 mg Oral QODAY  .  docusate sodium  300 mg Oral QHS  . DULoxetine  30 mg Oral Daily  . furosemide  80 mg Intravenous BID  . hydrocerin   Topical BID  . insulin aspart  0-15 Units Subcutaneous TID WC  . insulin aspart protamine- aspart  60 Units Subcutaneous BID WC  . levothyroxine  75 mcg Oral QAC breakfast  . pantoprazole  40 mg Oral BID  . rOPINIRole  1 mg Oral QHS,MR X 1  . rosuvastatin  10 mg Oral QHS  . sodium chloride flush  3 mL Intravenous Q12H  . sodium chloride flush  3 mL Intravenous Q12H  . sodium chloride flush  3 mL Intravenous Q12H  . spironolactone  25 mg Oral Daily  . tiotropium  18 mcg Inhalation Daily    Infusions: . sodium chloride    . sodium chloride 10 mL/hr at 01/28/17 0655  . sodium chloride    . sodium chloride    . amiodarone 30 mg/hr (02/04/17 0511)  . milrinone 0.375 mcg/kg/min (02/04/17 0406)  . potassium chloride      PRN Medications: sodium  chloride, sodium chloride, sodium chloride, acetaminophen, albuterol, ALPRAZolam, baclofen, hydrOXYzine, ondansetron (ZOFRAN) IV, sodium chloride flush, sodium chloride flush, sodium chloride flush, SUMAtriptan, traMADol    Patient Profile   [name] is a 61 year old with a history of R 2005/2008 and L 2017  breast cancer, HTN, DM, IBS, hyperlipidemia, chronic systolic heart failure, NICM chemo induced cardiomyopathy, St Jude ICD, and GI bleed S/B AVMs. R Breast cancer treatment with adriamycin in 2005. L breast cancer treatment with lumpectomy and radiation.   Assessment/Plan   1. A/C Systolic Heart Failure: NICM. EF 15% s/p ICD Chemo induced cardiomyopathy. ECHO 12/2016 EF 15%. Admitted with marked volume overload and low output.  - Co ox 60% on 0.375 mcg milrinone. Will keep at this dose today.   - Weight down 3 pounds overnight.  - Continue IV lasix 80 mg BID and metolazone today.  - No beta blocker with decompensation.  - RHC hopefully Thursday.  - For tunneled PICC today.  - She is being considered for LVAD for DT.  - Continue digoxin 0.125 mg - Continue Spiro 25 mg daily.  2. DM - Continue SSI  - no change to current plan.  3. Hypothyroidism - TSH 4.661 - Continue synthroid.  - No change to current plan  4. H/O R and L Breast Cancer:  - R breast treated with adriamycin 2005. L breast treated with lumpectomy and radiation.  - Multiple LN's and LUL mass concerning for recurrent breast CA. Needs outpatient PET scan once she is d/c'd.  - Dr. Haroldine [name] discussed with Dr. Lindi [name].  - no change to current plan.   5. H/O GI Bleed- multiple scopes with most recent 03/2016. Has h/o polyps and AVMs.  - No further.  6. Atrial fibrillation with RVR  - s/p DCCV, remains in NSR/Sinus tach today.  - Continue IV Amiodarone today. Milrinone increased yesterday and she remains tachycardiac.  7. OSA - Continue CPAP hs.  8. H/O NSVT - Continue Amiodarone.   - No change  9. Hypokalemia -  K 2.3.  - Given 26mEq po this am already. Will give another 85 now. Will write for 4 runs of K. Repeat BMET at noon.   Length of Stay: [place], [name]  02/04/2017, 7:29 AM  Advanced Heart Failure Team Pager [phone] (M-F; 7a - 4p)  Please contact [facility] for night-coverage after  hours (4p -7a ) and weekends on [email or web address]  Patient seen and examined with Joanne Booze, [name]. We discussed all aspects of the encounter. I agree with the assessment and plan as stated above.   She remains tenuous with volume overload. However volume status improving overnight. Co-ox improved with restoration of NSR and increased dose of milrinone. Will continue IV diuresis. Supp K aggressively. Will ask IR to place tunneled RIJ PICC for home milrinone. Plan RHC on Thursday.   Discussed at [facility] yesterday for possible VAD candidacy. SW to see.   Joanne Bickers, MD  8:50 AM

## 2017-02-04 NOTE — Consult Note (Addendum)
   [facility] CM Inpatient Consult   02/04/2017  BLAKELEE ALLINGTON [birth year] [id number]   [facility] Care Management follow up.  Went to bedside to discuss referral for [place] follow up post discharge. However, Ms. Sienkiewicz was off the unit for procedure.  Addendum: Made aware that Ms. Elmes was back in room by inpatient RNCM. Went to bedside to discuss [place] Management ongoing follow up. Ms. Marrone is agreeable and written consent obtained. [facility] Care Management packet and contact information provided.   Explained [facility] Care Management will not interfere or replace home health.  Ms. Yeung reports she has a new phone. She has the same telephone cell number as [phone]. She lives alone but plans to stay with her sister for 2 days post hospital discharge. States she will be back home on Sunday. States her anticipated dc date is Friday 02/07/17.  States her sister Ellard Artis is her HCPOA at [phone].  States the plan is to eventually have LVAD.   Denies having any [place] needs.   Appreciative of visit.   Made inpatient RNCM aware and will make referral to [facility].  Marthenia Rolling, MSN-Ed, RN,BSN [facility] Liaison [phone]

## 2017-02-04 NOTE — Progress Notes (Signed)
Patient's potassium 2.3 this morning, cardiology paged with this information via [place].

## 2017-02-04 NOTE — Procedures (Signed)
Interventional Radiology Procedure Note  Procedure: Tunneled CV catheter placement  Complications: None  Estimated Blood Loss: < 10 mL  24 cm DL tunneled Power line placed with tip at SVC/RA junction.  OK to use.  Venetia Night. Kathlene Cote, M.D Pager:  [phone]

## 2017-02-05 DIAGNOSIS — E876 Hypokalemia: Secondary | ICD-10-CM

## 2017-02-05 DIAGNOSIS — I4892 Unspecified atrial flutter: Secondary | ICD-10-CM

## 2017-02-05 LAB — BASIC METABOLIC PANEL
ANION GAP: 11 (ref 5–15)
BUN: 28 mg/dL — ABNORMAL HIGH (ref 6–20)
CHLORIDE: 87 mmol/L — AB (ref 101–111)
CO2: 34 mmol/L — AB (ref 22–32)
Calcium: 9.3 mg/dL (ref 8.9–10.3)
Creatinine, Ser: 1.13 mg/dL — ABNORMAL HIGH (ref 0.44–1.00)
GFR calc non Af Amer: 51 mL/min — ABNORMAL LOW (ref >60)
GFR, EST AFRICAN AMERICAN: 60 mL/min — AB (ref >60)
Glucose, Bld: 122 mg/dL — ABNORMAL HIGH (ref 65–99)
Potassium: 3.4 mmol/L — ABNORMAL LOW (ref 3.5–5.1)
Sodium: 132 mmol/L — ABNORMAL LOW (ref 135–145)

## 2017-02-05 LAB — GLUCOSE, CAPILLARY
GLUCOSE-CAPILLARY: 141 mg/dL — AB (ref 65–99)
GLUCOSE-CAPILLARY: 127 mg/dL — AB (ref 65–99)
GLUCOSE-CAPILLARY: 94 mg/dL (ref 65–99)
Glucose-Capillary: 249 mg/dL — ABNORMAL HIGH (ref 65–99)

## 2017-02-05 LAB — COOXEMETRY PANEL
CARBOXYHEMOGLOBIN: 1.4 % (ref 0.5–1.5)
Methemoglobin: 0.9 % (ref 0.0–1.5)
O2 SAT: 53.2 %
TOTAL HEMOGLOBIN: 13.6 g/dL (ref 12.0–16.0)

## 2017-02-05 LAB — CBC
HCT: 42.1 % (ref 36.0–46.0)
Hemoglobin: 13.4 g/dL (ref 12.0–15.0)
MCH: 28.9 pg (ref 26.0–34.0)
MCHC: 31.8 g/dL (ref 30.0–36.0)
MCV: 90.9 fL (ref 78.0–100.0)
PLATELETS: 216 10*3/uL (ref 150–400)
RBC: 4.63 MIL/uL (ref 3.87–5.11)
RDW: 16.8 % — AB (ref 11.5–15.5)
WBC: 7.4 10*3/uL (ref 4.0–10.5)

## 2017-02-05 MED ORDER — POTASSIUM CHLORIDE CRYS ER 20 MEQ PO TBCR
40.0000 meq | EXTENDED_RELEASE_TABLET | Freq: Two times a day (BID) | ORAL | Status: AC
  Administered 2017-02-05 – 2017-02-06 (×4): 40 meq via ORAL
  Filled 2017-02-05 (×4): qty 2

## 2017-02-05 MED ORDER — PSEUDOEPHEDRINE HCL 30 MG PO TABS
30.0000 mg | ORAL_TABLET | Freq: Once | ORAL | Status: AC
  Administered 2017-02-05: 30 mg via ORAL
  Filled 2017-02-05: qty 1

## 2017-02-05 MED ORDER — METOLAZONE 5 MG PO TABS
5.0000 mg | ORAL_TABLET | Freq: Every day | ORAL | Status: DC
  Administered 2017-02-05 – 2017-02-07 (×3): 5 mg via ORAL
  Filled 2017-02-05 (×3): qty 1

## 2017-02-05 NOTE — Progress Notes (Signed)
Advanced Heart Failure Rounding Note  PCP:  [name] NP Primary Cardiologist: Dr Haroldine Laws Endocrinologist: Dr Loanne Drilling Oncologist: Dr Lindi Adie GI: Dr Havery Moros Subjective:    Admitted 01/28/17 after [place], started on milrinone for reduced cardiac output, volume overload.   Developed Afib RVR, s/p DCCV 02/02/17.  Remains on milrinone 0.375.   Has migraine HA this am. Otherwise feels ok. No CP or orthopnea. CVP 15. Co-ox 53%. Weight down a pound.   Objective:   Weight Range: 92.1 kg (203 lb 1.6 oz) Body mass index is 33.8 kg/m.   Vital Signs:   Temp:  [97.4 F (36.3 C)-98 F (36.7 C)] 97.9 F (36.6 C) (07/04 1238) Pulse Rate:  [97-119] 97 (07/04 0738) Resp:  [17-20] 18 (07/04 0738) BP: (92-125)/(52-85) 124/85 (07/04 1238) SpO2:  [94 %-98 %] 98 % (07/04 1238) Weight:  [92.1 kg (203 lb 1.6 oz)] 92.1 kg (203 lb 1.6 oz) (07/04 0447) Last BM Date: 01/31/17  Weight change: Filed Weights   02/03/17 0416 02/04/17 0428 02/05/17 0447  Weight: 94.1 kg (207 lb 8 oz) 92.8 kg (204 lb 9.6 oz) 92.1 kg (203 lb 1.6 oz)    Intake/Output:   Intake/Output Summary (Last 24 hours) at 02/05/17 1404 Last data filed at 02/05/17 0950  Gross per 24 hour  Intake           2420.6 ml  Output             4150 ml  Net          -1729.4 ml      Physical Exam    CVP 15 General:  Fatigued appearing. No resp difficulty HEENT: normal Neck: supple. Tunneled PICC. JVP to ear Carotids 2+ bilat; no bruits. No lymphadenopathy or thryomegaly appreciated. Cor: PMI laterally displaced. IRR tachy. +s3. 2/6 TR Lungs: crackles at bases Abdomen: obese soft, nontender, nondistended. No hepatosplenomegaly. No bruits or masses. Good bowel sounds. Extremities: no cyanosis, clubbing, rash, 1+ edema Neuro: alert & orientedx3, cranial nerves grossly intact. moves all 4 extremities w/o difficulty. Affect pleasant     Telemetry   Probable atrial flutter with variable conduction 100-110  Personally  reviewed    EKG    n/a  Labs    Basic Metabolic Panel  Recent Labs  02/04/17 1525 02/05/17 0419  NA 133* 132*  K 3.6 3.4*  CL 93* 87*  CO2 31 34*  GLUCOSE 172* 122*  BUN 28* 28*  CREATININE 1.11* 1.13*  CALCIUM 9.8 9.3  MG 1.8  --      BNP: BNP (last 3 results)  Recent Labs  07/23/16 1200 12/21/16 1054  BNP 182.4* 474.9*        Imaging   Right/Left Heart Cath and Coronary Angiography 01/29/17 Findings:  Ao = 96/64 (76) LV = 96/37 RA =  20 RV =  54/21 PA =  59/30 (39) PCW = 33 Fick cardiac output/index = 3.1/1.6 PVR = 1.9 WU FA sat = 100% PA sat = 52%, 54% RA/PCWP = 0.61 PAPi = 1.45  Assessment:  1. Minimal non-obstructive CAD 2. Severe NICM with EF 10-15% 3. Elevated filling pressures with biventricular failure and severely reduced cardiac output   Plan/Discussion:  Admit for IV diuresis with milrinone support. Will need repeat RHC after she is tuned up to reassess VAD with regards to RV function.   Medications:     Scheduled Medications: . apixaban  5 mg Oral BID  . busPIRone  30 mg Oral BID  .  digoxin  0.125 mg Oral QODAY  . docusate sodium  300 mg Oral QHS  . DULoxetine  30 mg Oral Daily  . furosemide  80 mg Intravenous BID  . hydrocerin   Topical BID  . insulin aspart  0-15 Units Subcutaneous TID WC  . insulin aspart protamine- aspart  60 Units Subcutaneous BID WC  . ivabradine  2.5 mg Oral BID WC  . levothyroxine  75 mcg Oral QAC breakfast  . pantoprazole  40 mg Oral BID  . rOPINIRole  1 mg Oral QHS,MR X 1  . rosuvastatin  10 mg Oral QHS  . sodium chloride flush  3 mL Intravenous Q12H  . sodium chloride flush  3 mL Intravenous Q12H  . sodium chloride flush  3 mL Intravenous Q12H  . spironolactone  25 mg Oral Daily  . tiotropium  18 mcg Inhalation Daily    Infusions: . sodium chloride    . sodium chloride 10 mL/hr at 01/28/17 0655  . sodium chloride    . sodium chloride    . amiodarone 30 mg/hr (02/05/17  0210)  . milrinone 0.375 mcg/kg/min (02/05/17 0950)    PRN Medications: sodium chloride, sodium chloride, sodium chloride, acetaminophen, albuterol, ALPRAZolam, baclofen, hydrOXYzine, lidocaine, ondansetron (ZOFRAN) IV, sodium chloride flush, sodium chloride flush, sodium chloride flush, SUMAtriptan, traMADol    Patient Profile   Ms Cumbee is a 61 year old with a history of R 2005/2008 and L 2017  breast cancer, HTN, DM, IBS, hyperlipidemia, chronic systolic heart failure, NICM chemo induced cardiomyopathy, St Jude ICD, and GI bleed S/B AVMs. R Breast cancer treatment with adriamycin in 2005. L breast cancer treatment with lumpectomy and radiation.   Assessment/Plan   1. A/C Systolic Heart Failure -> cardiogenic shock:  - NICM.Chemo induced cardiomyopathy. ECHO 12/2016 EF 15% RV mild to moderate HK. S/p ICD - Remains very tenuous despite milrinone support at 0.375. Co-ox marginal. CVP up   - Continue IV lasix 80 mg BID and metolazone today.  - No beta blocker with decompensation.  - She appears to be in AFL today. Will defer RHC and plan DC-CV tomorrow. Possible RHC on Firday to assess RV vs V failure and response to milrinone.  - Continue digoxin 0.125 mg - Continue Spiro 25 mg daily.  -- She is being considered for LVAD for DT. Has tunneled PICC for home milrinone. Prior to VAD will need to better evaluate for recurrent breast CA (see below) and if RV is suitable.  -- I am increasingly concerned about her prognosis. 2. Atrial fibrillation with RVR  - s/p DCCV 02/02/17 now appears to be in AFL. Likely worsening HF - Continue IV Amiodarone. Plan repeat DC-CV tomorrow. Make NPO 3. Hypothyroidism - TSH 4.661 - Continue synthroid.  - No change to current plan  4. H/O R and L Breast Cancer:  - R breast treated with adriamycin 2005. L breast treated with lumpectomy and radiation.  - Multiple LN's and LUL mass concerning for recurrent breast CA. Needs outpatient PET scan once she is d/c'd.   - I have discussed with Dr. Lindi Adie 5. H/O GI Bleed - multiple scopes with most recent 03/2016. Has h/o polyps and AVMs.  - No recent bleeding 6. DM - Continue SSI  - no change to current plan.  7. OSA - Continue CPAP hs.  8. H/O NSVT - Continue Amiodarone.   - No change  9. Hypokalemia - K 3.4.  - Continue spiro. Will supp K.    Length  of Stay: 8  Glori Bickers, MD  02/05/2017, 2:04 PM  Advanced Heart Failure Team Pager [phone] (M-F; 7a - 4p)  Please contact [facility] for night-coverage after hours (4p -7a ) and weekends on [email or web address]

## 2017-02-06 ENCOUNTER — Inpatient Hospital Stay (HOSPITAL_COMMUNITY): Payer: Medicare Other | Admitting: Certified Registered Nurse Anesthetist

## 2017-02-06 ENCOUNTER — Ambulatory Visit: Payer: Medicare Other | Admitting: Hematology and Oncology

## 2017-02-06 ENCOUNTER — Encounter (HOSPITAL_COMMUNITY): Payer: Self-pay | Admitting: *Deleted

## 2017-02-06 ENCOUNTER — Encounter: Payer: Self-pay | Admitting: *Deleted

## 2017-02-06 ENCOUNTER — Encounter (HOSPITAL_COMMUNITY)
Admission: AD | Disposition: A | Discharge status: Home Health | Payer: Self-pay | Source: Ambulatory Visit | Attending: Internal Medicine

## 2017-02-06 HISTORY — PX: CARDIOVERSION: SHX1299

## 2017-02-06 LAB — BASIC METABOLIC PANEL
Anion gap: 9 (ref 5–15)
BUN: 28 mg/dL — AB (ref 6–20)
CHLORIDE: 86 mmol/L — AB (ref 101–111)
CO2: 38 mmol/L — ABNORMAL HIGH (ref 22–32)
CREATININE: 1.04 mg/dL — AB (ref 0.44–1.00)
Calcium: 9.7 mg/dL (ref 8.9–10.3)
GFR calc Af Amer: >60 mL/min (ref >60)
GFR calc non Af Amer: 57 mL/min — ABNORMAL LOW (ref >60)
GLUCOSE: 98 mg/dL (ref 65–99)
POTASSIUM: 2.3 mmol/L — AB (ref 3.5–5.1)
Sodium: 133 mmol/L — ABNORMAL LOW (ref 135–145)

## 2017-02-06 LAB — COOXEMETRY PANEL
Carboxyhemoglobin: 1.2 % (ref 0.5–1.5)
METHEMOGLOBIN: 0.9 % (ref 0.0–1.5)
O2 Saturation: 51.5 %
TOTAL HEMOGLOBIN: 14.8 g/dL (ref 12.0–16.0)

## 2017-02-06 LAB — GLUCOSE, CAPILLARY
GLUCOSE-CAPILLARY: 145 mg/dL — AB (ref 65–99)
GLUCOSE-CAPILLARY: 157 mg/dL — AB (ref 65–99)
GLUCOSE-CAPILLARY: 188 mg/dL — AB (ref 65–99)
Glucose-Capillary: 385 mg/dL — ABNORMAL HIGH (ref 65–99)
Glucose-Capillary: 77 mg/dL (ref 65–99)

## 2017-02-06 LAB — POCT I-STAT, CHEM 8
BUN: 32 mg/dL — ABNORMAL HIGH (ref 6–20)
CALCIUM ION: 1.11 mmol/L — AB (ref 1.15–1.40)
Chloride: 85 mmol/L — ABNORMAL LOW (ref 101–111)
Creatinine, Ser: 1.2 mg/dL — ABNORMAL HIGH (ref 0.44–1.00)
Glucose, Bld: 157 mg/dL — ABNORMAL HIGH (ref 65–99)
HCT: 48 % — ABNORMAL HIGH (ref 36.0–46.0)
HEMOGLOBIN: 16.3 g/dL — AB (ref 12.0–15.0)
Potassium: 2.9 mmol/L — ABNORMAL LOW (ref 3.5–5.1)
SODIUM: 136 mmol/L (ref 135–145)
TCO2: 36 mmol/L (ref 0–100)

## 2017-02-06 LAB — MAGNESIUM: Magnesium: 1.8 mg/dL (ref 1.7–2.4)

## 2017-02-06 SURGERY — CARDIOVERSION
Anesthesia: General

## 2017-02-06 MED ORDER — ADENOSINE 6 MG/2ML IV SOLN
INTRAVENOUS | Status: AC
  Filled 2017-02-06: qty 2

## 2017-02-06 MED ORDER — SODIUM CHLORIDE 0.9 % IV SOLN
INTRAVENOUS | Status: DC | PRN
  Administered 2017-02-06: 12:00:00 via INTRAVENOUS

## 2017-02-06 MED ORDER — FUROSEMIDE 10 MG/ML IJ SOLN
80.0000 mg | Freq: Two times a day (BID) | INTRAMUSCULAR | Status: DC
  Administered 2017-02-06 – 2017-02-07 (×4): 80 mg via INTRAVENOUS
  Filled 2017-02-06 (×4): qty 8

## 2017-02-06 MED ORDER — ETOMIDATE 2 MG/ML IV SOLN
INTRAVENOUS | Status: DC | PRN
  Administered 2017-02-06: 8 mg via INTRAVENOUS
  Administered 2017-02-06: 10 mg via INTRAVENOUS

## 2017-02-06 MED ORDER — ADENOSINE 6 MG/2ML IV SOLN
INTRAVENOUS | Status: DC | PRN
  Administered 2017-02-06: 6 mg via INTRAVENOUS
  Administered 2017-02-06: 12 mg via INTRAVENOUS

## 2017-02-06 MED ORDER — ADENOSINE 6 MG/2ML IV SOLN
INTRAVENOUS | Status: AC
  Filled 2017-02-06: qty 4

## 2017-02-06 MED ORDER — POTASSIUM CHLORIDE CRYS ER 20 MEQ PO TBCR
80.0000 meq | EXTENDED_RELEASE_TABLET | Freq: Once | ORAL | Status: AC
  Administered 2017-02-06: 80 meq via ORAL
  Filled 2017-02-06: qty 4

## 2017-02-06 MED ORDER — MAGNESIUM SULFATE 2 GM/50ML IV SOLN
2.0000 g | Freq: Once | INTRAVENOUS | Status: AC
  Administered 2017-02-06: 2 g via INTRAVENOUS
  Filled 2017-02-06: qty 50

## 2017-02-06 MED ORDER — POTASSIUM CHLORIDE 10 MEQ/50ML IV SOLN
10.0000 meq | INTRAVENOUS | Status: AC
  Administered 2017-02-06 (×4): 10 meq via INTRAVENOUS
  Filled 2017-02-06 (×4): qty 50

## 2017-02-06 NOTE — H&P (View-Only) (Signed)
Advanced Heart Failure Rounding Note  PCP:  [name] NP Primary Cardiologist: Dr Haroldine Laws Endocrinologist: Dr Loanne Drilling Oncologist: Dr Lindi Adie GI: Dr Havery Moros Subjective:    Admitted 01/28/17 after [place], started on milrinone for reduced cardiac output, volume overload.   Developed Afib RVR, s/p DCCV 02/02/17.  Remains on milrinone 0.375.   CVP 12-13 . Co ox 52%. Weight down 10 pounds total.   Tearful this morning, concerned about the long term outcomes of her heart failure. Denies SOB, feels anxious. Denies chest pain and palpitations.   Objective:   Weight Range: 201 lb 1.6 oz (91.2 kg) Body mass index is 33.46 kg/m.   Vital Signs:   Temp:  [97.5 F (36.4 C)-97.9 F (36.6 C)] 97.7 F (36.5 C) (07/05 0425) Pulse Rate:  [97-104] 101 (07/05 0425) Resp:  [18-21] 21 (07/05 0425) BP: (97-124)/(57-85) 97/72 (07/05 0425) SpO2:  [95 %-100 %] 100 % (07/05 0425) Weight:  [201 lb 1.6 oz (91.2 kg)] 201 lb 1.6 oz (91.2 kg) (07/05 0425) Last BM Date: 01/31/17  Weight change: Filed Weights   02/04/17 0428 02/05/17 0447 02/06/17 0425  Weight: 204 lb 9.6 oz (92.8 kg) 203 lb 1.6 oz (92.1 kg) 201 lb 1.6 oz (91.2 kg)    Intake/Output:   Intake/Output Summary (Last 24 hours) at 02/06/17 0716 Last data filed at 02/06/17 0622  Gross per 24 hour  Intake          1431.18 ml  Output             2700 ml  Net         -1268.82 ml      Physical Exam    CVP 12-13 General:E[place] appearing female. NAD.  HEENT: Normal Neck: Supple. JVP to jaw. Carotids 2+ bilat; no bruits. No thyromegaly or nodule noted. Cor: PMI nondisplaced.Tachy, irregular rhythm. No M/G/R noted Lungs: Clear in upper lobes, bilateral crackles in bases.  Abdomen: Soft, non-tender, + distended, no HSM. No bruits or masses. +BS  Extremities: No cyanosis, clubbing, rash, trace pedal edema.  Neuro: Alert & orientedx3, cranial nerves grossly intact. moves all 4 extremities w/o difficulty. Affect sad      Telemetry     Atrial flutter, rates in the 120's.     EKG    N/A  Labs    Basic Metabolic Panel  Recent Labs  02/04/17 1525 02/05/17 0419 02/06/17 0347  NA 133* 132* 133*  K 3.6 3.4* 2.3*  CL 93* 87* 86*  CO2 31 34* 38*  GLUCOSE 172* 122* 98  BUN 28* 28* 28*  CREATININE 1.11* 1.13* 1.04*  CALCIUM 9.8 9.3 9.7  MG 1.8  --   --      BNP: BNP (last 3 results)  Recent Labs  07/23/16 1200 12/21/16 1054  BNP 182.4* 474.9*        Imaging   Right/Left Heart Cath and Coronary Angiography 01/29/17 Findings:  Ao = 96/64 (76) LV = 96/37 RA =  20 RV =  54/21 PA =  59/30 (39) PCW = 33 Fick cardiac output/index = 3.1/1.6 PVR = 1.9 WU FA sat = 100% PA sat = 52%, 54% RA/PCWP = 0.61 PAPi = 1.45  Assessment:  1. Minimal non-obstructive CAD 2. Severe NICM with EF 10-15% 3. Elevated filling pressures with biventricular failure and severely reduced cardiac output   Plan/Discussion:  Admit for IV diuresis with milrinone support. Will need repeat RHC after she is tuned up to reassess VAD with regards to RV  function.   Medications:     Scheduled Medications: . apixaban  5 mg Oral BID  . busPIRone  30 mg Oral BID  . digoxin  0.125 mg Oral QODAY  . docusate sodium  300 mg Oral QHS  . DULoxetine  30 mg Oral Daily  . furosemide  80 mg Intravenous BID  . hydrocerin   Topical BID  . insulin aspart  0-15 Units Subcutaneous TID WC  . insulin aspart protamine- aspart  60 Units Subcutaneous BID WC  . ivabradine  2.5 mg Oral BID WC  . levothyroxine  75 mcg Oral QAC breakfast  . metolazone  5 mg Oral Daily  . pantoprazole  40 mg Oral BID  . potassium chloride  40 mEq Oral BID  . rOPINIRole  1 mg Oral QHS,MR X 1  . rosuvastatin  10 mg Oral QHS  . sodium chloride flush  3 mL Intravenous Q12H  . sodium chloride flush  3 mL Intravenous Q12H  . sodium chloride flush  3 mL Intravenous Q12H  . spironolactone  25 mg Oral Daily  . tiotropium  18 mcg Inhalation Daily     Infusions: . sodium chloride    . sodium chloride 10 mL/hr at 01/28/17 0655  . sodium chloride    . sodium chloride    . amiodarone 30 mg/hr (02/06/17 0340)  . milrinone 0.375 mcg/kg/min (02/06/17 0340)    PRN Medications: sodium chloride, sodium chloride, sodium chloride, acetaminophen, albuterol, ALPRAZolam, baclofen, hydrOXYzine, lidocaine, ondansetron (ZOFRAN) IV, sodium chloride flush, sodium chloride flush, sodium chloride flush, SUMAtriptan, traMADol    Patient Profile   Ms Ghazi is a 61 year old with a history of R 2005/2008 and L 2017  breast cancer, HTN, DM, IBS, hyperlipidemia, chronic systolic heart failure, NICM chemo induced cardiomyopathy, St Jude ICD, and GI bleed S/B AVMs. R Breast cancer treatment with adriamycin in 2005. L breast cancer treatment with lumpectomy and radiation.   Assessment/Plan   1. A/C Systolic Heart Failure -> cardiogenic shock:  - NICM.Chemo induced cardiomyopathy. ECHO 12/2016 EF 15% RV mild to moderate HK. S/p ICD - Tenuous despite milrinone at 0.375 mcg, now with aflutter worsening her HF - Continue IV lasix 80 mg BID and 5 mg metolazone today.  - No beta blocker with decompensation.  - Back in Aflutter, plan for DCCV today.  - Continue digoxin 0.125 mg - Continue Spiro 25 mg daily.  -- She is being considered for LVAD for DT. Has tunneled PICC for home milrinone. Prior to VAD will need to better evaluate for recurrent breast CA (see below) and if RV is suitable.    2. Atrial fibrillation with RVR  - s/p DCCV 02/02/17, developed recurrent Aflutter on 02/05/17. Plan for DCCV today.  - Continue IV amiodarone.   3. Hypothyroidism - TSH 4.661 - Continue synthroid.  - No change to current plan.   4. H/O R and L Breast Cancer:  - R breast treated with adriamycin 2005. L breast treated with lumpectomy and radiation.  - Multiple LN's and LUL mass concerning for recurrent breast CA. Needs outpatient PET scan once she is d/c'd.  -Dr.  Haroldine Laws has discussed with Dr. Lindi Adie.   5. H/O GI Bleed - multiple scopes with most recent 03/2016. Has h/o polyps and AVMs.  - No further, denies melena and hematochezia  6. DM - Continue SSI  - No change to plan  7. OSA - Continue CPAP hs.    8. H/O NSVT - Continue Amiodarone.   -  Continue current medical management.   9. Hypokalemia - K 2.3. Got 67mEq po this am per fellow.  - Will write for 4 runs of Kcl and keep po 50mEq BID ordered for today.  - check mg now  Length of Stay: [place], NP  02/06/2017, 7:16 AM  Advanced Heart Failure Team Pager [phone] (M-F; [place])  Please contact [facility] for night-coverage after hours (4p -7a ) and weekends on [email or web address]  Patient seen and examined with Jettie Booze, NP. We discussed all aspects of the encounter. I agree with the assessment and plan as stated above.   She remains extremely tenuous. Underwent repeat DC-CV today x 2. Unclear if se is now in sinus tach or atrial tach. Will repeat ECG. Cardiac output tenuous despite milrinone support. CVP improving but still elevated at 12-13. Agree with continuing IV lasix and metolazone. Will plan RHC tomorrow to re-evaluate. [facility] helping to set up home milrinone.  Will need outpatient PET to assess for recurrence of breast CA prior to completing VAD work-up.   Glori Bickers, MD  8:25 PM

## 2017-02-06 NOTE — Anesthesia Preprocedure Evaluation (Addendum)
Anesthesia Evaluation  Patient identified by MRN, date of birth, ID band Patient awake    Reviewed: Allergy & Precautions, NPO status , Patient's Chart, lab work & pertinent test results  Airway Mallampati: II  TM Distance: >3 FB Neck ROM: Full    Dental  (+) Edentulous Upper, Dental Advisory Given   Pulmonary asthma , sleep apnea and Continuous Positive Airway Pressure Ventilation , COPD,  COPD inhaler, former smoker,    breath sounds clear to auscultation       Cardiovascular hypertension, + CAD, + Past MI and +CHF  + dysrhythmias Atrial Fibrillation + pacemaker + Cardiac Defibrillator  Rhythm:Regular Rate:Normal     Neuro/Psych  Headaches, PSYCHIATRIC DISORDERS Anxiety Depression    GI/Hepatic GERD  ,  Endo/Other  diabetesHypothyroidism   Renal/GU   negative genitourinary   Musculoskeletal  (+) Arthritis , Osteoarthritis,    Abdominal (+) + obese,   Peds negative pediatric ROS (+)  Hematology   Anesthesia Other Findings   Reproductive/Obstetrics negative OB ROS                            Lab Results  Component Value Date   WBC 7.4 02/05/2017   HGB 16.3 (H) 02/06/2017   HCT 48.0 (H) 02/06/2017   MCV 90.9 02/05/2017   PLT 216 02/05/2017   Lab Results  Component Value Date   CREATININE 1.20 (H) 02/06/2017   BUN 32 (H) 02/06/2017   NA 136 02/06/2017   K 2.9 (L) 02/06/2017   CL 85 (L) 02/06/2017   CO2 38 (H) 02/06/2017   Echo: - Left ventricle: The cavity size was mildly dilated. Wall   thickness was normal. Systolic function was severely reduced. The   estimated ejection fraction was 15%. Diffuse hypokinesis. - Mitral valve: Calcified annulus. There was mild regurgitation. - Pulmonary arteries: PA peak pressure: 40 mm Hg (S).  Anesthesia Physical Anesthesia Plan  ASA: IV  Anesthesia Plan: General   Post-op Pain Management:    Induction: Intravenous  PONV Risk Score  and Plan: Propofol  Airway Management Planned: Mask  Additional Equipment:   Intra-op Plan:   Post-operative Plan:   Informed Consent: I have reviewed the patients History and Physical, chart, labs and discussed the procedure including the risks, benefits and alternatives for the proposed anesthesia with the patient or authorized representative who has indicated his/her understanding and acceptance.     Plan Discussed with:   Anesthesia Plan Comments:         Anesthesia Quick Evaluation

## 2017-02-06 NOTE — Interval H&P Note (Signed)
History and Physical Interval Note:  02/06/2017 8:27 PM  [name]  has presented today for surgery, with the diagnosis of Atrial Fibrillation  The various methods of treatment have been discussed with the patient and family. After consideration of risks, benefits and other options for treatment, the patient has consented to  Procedure(s): CARDIOVERSION (N/A) as a surgical intervention .  The patient's history has been reviewed, patient examined, no change in status, stable for surgery.  I have reviewed the patient's chart and labs.  Questions were answered to the patient's satisfaction.     Tyrick Dunagan, Quillian Quince

## 2017-02-06 NOTE — Transfer of Care (Signed)
Immediate Anesthesia Transfer of Care Note  Patient: [name]  Procedure(s) Performed: Procedure(s): CARDIOVERSION (N/A)  Patient Location: Endoscopy Unit  Anesthesia Type:General  Level of Consciousness: awake and alert   Airway & Oxygen Therapy: Patient Spontanous Breathing  Post-op Assessment: Report given to RN, Post -op Vital signs reviewed and stable and Patient moving all extremities X 4  Post vital signs: Reviewed and stable  Last Vitals:  Vitals:   02/06/17 0729 02/06/17 1114  BP: 105/63 (!) 99/54  Pulse: (!) 105 (!) 108  Resp: 14 15  Temp: 36.6 C 37 C    Last Pain:  Vitals:   02/06/17 1114  TempSrc: Oral  PainSc:       Patients Stated Pain Goal: 0 (05/31/24 3664)  Complications: No apparent anesthesia complications

## 2017-02-06 NOTE — Progress Notes (Signed)
Advanced Heart Failure Rounding Note  PCP:  Joanne Peace NP Primary Cardiologist: Dr [name] Endocrinologist: Dr [name] Oncologist: Dr [name] GI: Dr [name] Subjective:    Admitted 01/28/17 after [place], started on milrinone for reduced cardiac output, volume overload.   Developed Afib RVR, s/p DCCV 02/02/17.  Remains on milrinone 0.375.   CVP 12-13 . Co ox 52%. Weight down 10 pounds total.   Tearful this morning, concerned about the long term outcomes of her heart failure. Denies SOB, feels anxious. Denies chest pain and palpitations.   Objective:   Weight Range: 201 lb 1.6 oz (91.2 kg) Body mass index is 33.46 kg/m.   Vital Signs:   Temp:  [97.5 F (36.4 C)-97.9 F (36.6 C)] 97.7 F (36.5 C) (07/05 0425) Pulse Rate:  [97-104] 101 (07/05 0425) Resp:  [18-21] 21 (07/05 0425) BP: (97-124)/(57-85) 97/72 (07/05 0425) SpO2:  [95 %-100 %] 100 % (07/05 0425) Weight:  [201 lb 1.6 oz (91.2 kg)] 201 lb 1.6 oz (91.2 kg) (07/05 0425) Last BM Date: 01/31/17  Weight change: Filed Weights   02/04/17 0428 02/05/17 0447 02/06/17 0425  Weight: 204 lb 9.6 oz (92.8 kg) 203 lb 1.6 oz (92.1 kg) 201 lb 1.6 oz (91.2 kg)    Intake/Output:   Intake/Output Summary (Last 24 hours) at 02/06/17 0716 Last data filed at 02/06/17 0622  Gross per 24 hour  Intake          1431.18 ml  Output             2700 ml  Net         -1268.82 ml      Physical Exam    CVP 12-13 General:Eldery appearing female. NAD.  HEENT: Normal Neck: Supple. JVP to jaw. Carotids 2+ bilat; no bruits. No thyromegaly or nodule noted. Cor: PMI nondisplaced.Tachy, irregular rhythm. No M/G/R noted Lungs: Clear in upper lobes, bilateral crackles in bases.  Abdomen: Soft, non-tender, + distended, no HSM. No bruits or masses. +BS  Extremities: No cyanosis, clubbing, rash, trace pedal edema.  Neuro: Alert & orientedx3, cranial nerves grossly intact. moves all 4 extremities w/o difficulty. Affect sad      Telemetry     Atrial flutter, rates in the 120's.     EKG    N/A  Labs    Basic Metabolic Panel  Recent Labs  02/04/17 1525 02/05/17 0419 02/06/17 0347  NA 133* 132* 133*  K 3.6 3.4* 2.3*  CL 93* 87* 86*  CO2 31 34* 38*  GLUCOSE 172* 122* 98  BUN 28* 28* 28*  CREATININE 1.11* 1.13* 1.04*  CALCIUM 9.8 9.3 9.7  MG 1.8  --   --      BNP: BNP (last 3 results)  Recent Labs  07/23/16 1200 12/21/16 1054  BNP 182.4* 474.9*        Imaging   Right/Left Heart Cath and Coronary Angiography 01/29/17 Findings:  Ao = 96/64 (76) LV = 96/37 RA =  20 RV =  54/21 PA =  59/30 (39) PCW = 33 Fick cardiac output/index = 3.1/1.6 PVR = 1.9 WU FA sat = 100% PA sat = 52%, 54% RA/PCWP = 0.61 PAPi = 1.45  Assessment:  1. Minimal non-obstructive CAD 2. Severe NICM with EF 10-15% 3. Elevated filling pressures with biventricular failure and severely reduced cardiac output   Plan/Discussion:  Admit for IV diuresis with milrinone support. Will need repeat RHC after she is tuned up to reassess VAD with regards to RV  function.   Medications:     Scheduled Medications: . apixaban  5 mg Oral BID  . busPIRone  30 mg Oral BID  . digoxin  0.125 mg Oral QODAY  . docusate sodium  300 mg Oral QHS  . DULoxetine  30 mg Oral Daily  . furosemide  80 mg Intravenous BID  . hydrocerin   Topical BID  . insulin aspart  0-15 Units Subcutaneous TID WC  . insulin aspart protamine- aspart  60 Units Subcutaneous BID WC  . ivabradine  2.5 mg Oral BID WC  . levothyroxine  75 mcg Oral QAC breakfast  . metolazone  5 mg Oral Daily  . pantoprazole  40 mg Oral BID  . potassium chloride  40 mEq Oral BID  . rOPINIRole  1 mg Oral QHS,MR X 1  . rosuvastatin  10 mg Oral QHS  . sodium chloride flush  3 mL Intravenous Q12H  . sodium chloride flush  3 mL Intravenous Q12H  . sodium chloride flush  3 mL Intravenous Q12H  . spironolactone  25 mg Oral Daily  . tiotropium  18 mcg Inhalation Daily     Infusions: . sodium chloride    . sodium chloride 10 mL/hr at 01/28/17 0655  . sodium chloride    . sodium chloride    . amiodarone 30 mg/hr (02/06/17 0340)  . milrinone 0.375 mcg/kg/min (02/06/17 0340)    PRN Medications: sodium chloride, sodium chloride, sodium chloride, acetaminophen, albuterol, ALPRAZolam, baclofen, hydrOXYzine, lidocaine, ondansetron (ZOFRAN) IV, sodium chloride flush, sodium chloride flush, sodium chloride flush, SUMAtriptan, traMADol    Patient Profile   [name] is a 61 year old with a history of R 2005/2008 and L 2017  breast cancer, HTN, DM, IBS, hyperlipidemia, chronic systolic heart failure, NICM chemo induced cardiomyopathy, St Jude ICD, and GI bleed S/B AVMs. R Breast cancer treatment with adriamycin in 2005. L breast cancer treatment with lumpectomy and radiation.   Assessment/Plan   1. A/C Systolic Heart Failure -> cardiogenic shock:  - NICM.Chemo induced cardiomyopathy. ECHO 12/2016 EF 15% RV mild to moderate HK. S/p ICD - Tenuous despite milrinone at 0.375 mcg, now with aflutter worsening her HF - Continue IV lasix 80 mg BID and 5 mg metolazone today.  - No beta blocker with decompensation.  - Back in Aflutter, plan for DCCV today.  - Continue digoxin 0.125 mg - Continue Spiro 25 mg daily.  -- She is being considered for LVAD for DT. Has tunneled PICC for home milrinone. Prior to VAD will need to better evaluate for recurrent breast CA (see below) and if RV is suitable.    2. Atrial fibrillation with RVR  - s/p DCCV 02/02/17, developed recurrent Aflutter on 02/05/17. Plan for DCCV today.  - Continue IV amiodarone.   3. Hypothyroidism - TSH 4.661 - Continue synthroid.  - No change to current plan.   4. H/O R and L Breast Cancer:  - R breast treated with adriamycin 2005. L breast treated with lumpectomy and radiation.  - Multiple LN's and LUL mass concerning for recurrent breast CA. Needs outpatient PET scan once she is d/c'd.  -Dr.  Haroldine [name] has discussed with Dr. Lindi [name].   5. H/O GI Bleed - multiple scopes with most recent 03/2016. Has h/o polyps and AVMs.  - No further, denies melena and hematochezia  6. DM - Continue SSI  - No change to plan  7. OSA - Continue CPAP hs.    8. H/O NSVT - Continue Amiodarone.   -  Continue current medical management.   9. Hypokalemia - K 2.3. Got 43mEq po this am per fellow.  - Will write for 4 runs of Kcl and keep po 48mEq BID ordered for today.  - check mg now  Length of Stay: [place], NP  02/06/2017, 7:16 AM  Advanced Heart Failure Team Pager [phone] (M-F; [place])  Please contact [facility] for night-coverage after hours (4p -7a ) and weekends on [email or web address]  Patient seen and examined with Joanne Booze, NP. We discussed all aspects of the encounter. I agree with the assessment and plan as stated above.   She remains extremely tenuous. Underwent repeat DC-CV today x 2. Unclear if se is now in sinus tach or atrial tach. Will repeat ECG. Cardiac output tenuous despite milrinone support. CVP improving but still elevated at 12-13. Agree with continuing IV lasix and metolazone. Will plan RHC tomorrow to re-evaluate. [facility] helping to set up home milrinone.  Will need outpatient PET to assess for recurrence of breast CA prior to completing VAD work-up.   Joanne Bickers, MD  8:25 PM

## 2017-02-06 NOTE — Assessment & Plan Note (Deleted)
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 I recommended that the patient come to our survivorship clinic for her annual visits for surveillance.

## 2017-02-06 NOTE — Progress Notes (Deleted)
Error

## 2017-02-06 NOTE — CV Procedure (Signed)
   DIRECT CURRENT CARDIOVERSION  NAME:  SOPHIA SPERRY   MRN: [id number] DOB:  08/24/1955   ADMIT DATE: 01/28/2017   INDICATIONS: Atrial fibrillation    PROCEDURE:   Informed consent was obtained prior to the procedure. The risks, benefits and alternatives for the procedure were discussed and the patient comprehended these risks. Once an appropriate time out was taken, the patient had the defibrillator pads placed in the anterior and posterior position.   It was unclear if the presenting rhythm was AFL or atrial tach so 6 mg of adenosine was administered with no effect. The patient then underwent sedation by the anesthesia service. Once an appropriate level of sedation was achieved, the patient received a single biphasic, synchronized 200J shock. The rhythm appeared to be atrial flutter. Amber Seiler NP-C from EP joined me in the room and we agreed that rhythm was probably a left-sided flutter. An additional 12mg  of adenosine was given which again had no effect. The patient underwent further sedation by the anesthesia service and received another 200J shock with manual pressure on the anterior pad. She appeard to have 1 sinus beat then reverted back to atrial tach or atrial flutter. The ICD rep was present for the entire procedure to help evaluate device parameters but was unable to assist with rhythm determination as Ms. Nakagawa has only a single-chamber device.    No apparent complications.  Glori Bickers, MD  8:33 PM

## 2017-02-06 NOTE — Anesthesia Postprocedure Evaluation (Signed)
Anesthesia Post Note  Patient: [name]  Procedure(s) Performed: Procedure(s) (LRB): CARDIOVERSION (N/A)     Patient location during evaluation: PACU Anesthesia Type: General Level of consciousness: awake and alert Pain management: pain level controlled Vital Signs Assessment: post-procedure vital signs reviewed and stable Respiratory status: spontaneous breathing, nonlabored ventilation, respiratory function stable and patient connected to nasal cannula oxygen Cardiovascular status: blood pressure returned to baseline and stable Postop Assessment: no signs of nausea or vomiting Anesthetic complications: no    Last Vitals:  Vitals:   02/06/17 1250 02/06/17 1300  BP: (!) 103/59 101/71  Pulse: (!) 112 (!) 108  Resp: 12 19  Temp:      Last Pain:  Vitals:   02/06/17 1247  TempSrc: Oral  PainSc:                  Effie Berkshire

## 2017-02-06 NOTE — Progress Notes (Signed)
Patient morning potassium level resulted in 2.3 Paged on call cardiology fellow. New orders to give one time dose of 80 mg potassium. Will continue to monitor.

## 2017-02-06 NOTE — Anesthesia Procedure Notes (Signed)
Procedure Name: General with mask airway Date/Time: 02/06/2017 12:14 PM Performed by: Garrison Columbus T Pre-anesthesia Checklist: Patient identified, Emergency Drugs available, Suction available and Patient being monitored Patient Re-evaluated:Patient Re-evaluated prior to inductionOxygen Delivery Method: Ambu bag Preoxygenation: Pre-oxygenation with 100% oxygen Intubation Type: IV induction Placement Confirmation: positive ETCO2 and breath sounds checked- equal and bilateral Dental Injury: Teeth and Oropharynx as per pre-operative assessment

## 2017-02-06 NOTE — Accreditation Note (Signed)
Pt. was not in her room. Spiriva not given

## 2017-02-06 NOTE — Progress Notes (Signed)
[facility]  [name] is a new pt for [facility] this hospital admission.  [facility] will provide [facility] and Home Infusion Pharm[facility] services for [name] upon DC to home.  [facility] hospital infusion coordinator has met with pt to provide in hospital teaching regarding [place] [facility] HF management program and home Milrinone.  We will be prepared for DC when ordered.  If patient discharges after hours, please call [phone].   Larry Sierras 02/06/2017, 4:59 PM

## 2017-02-07 ENCOUNTER — Encounter (HOSPITAL_COMMUNITY)
Admission: AD | Disposition: A | Discharge status: Home Health | Payer: Self-pay | Source: Ambulatory Visit | Attending: Internal Medicine

## 2017-02-07 ENCOUNTER — Encounter (HOSPITAL_COMMUNITY): Payer: Self-pay | Admitting: Internal Medicine

## 2017-02-07 DIAGNOSIS — G4733 Obstructive sleep apnea (adult) (pediatric): Secondary | ICD-10-CM | POA: Diagnosis not present

## 2017-02-07 DIAGNOSIS — I5023 Acute on chronic systolic (congestive) heart failure: Secondary | ICD-10-CM | POA: Diagnosis not present

## 2017-02-07 HISTORY — PX: RIGHT HEART CATH: CATH118263

## 2017-02-07 LAB — POCT I-STAT 3, ART BLOOD GAS (G3+)
ACID-BASE EXCESS: 9 mmol/L — AB (ref 0.0–2.0)
BICARBONATE: 32.9 mmol/L — AB (ref 20.0–28.0)
O2 Saturation: 93 %
PO2 ART: 60 mmHg — AB (ref 83.0–108.0)
TCO2: 34 mmol/L (ref 0–100)
pCO2 arterial: 40.7 mmHg (ref 32.0–48.0)
pH, Arterial: 7.514 — ABNORMAL HIGH (ref 7.350–7.450)

## 2017-02-07 LAB — BASIC METABOLIC PANEL
Anion gap: 9 (ref 5–15)
BUN: 31 mg/dL — AB (ref 6–20)
CALCIUM: 9.6 mg/dL (ref 8.9–10.3)
CHLORIDE: 85 mmol/L — AB (ref 101–111)
CO2: 35 mmol/L — AB (ref 22–32)
CREATININE: 1.16 mg/dL — AB (ref 0.44–1.00)
GFR calc Af Amer: 58 mL/min — ABNORMAL LOW (ref >60)
GFR calc non Af Amer: 50 mL/min — ABNORMAL LOW (ref >60)
GLUCOSE: 126 mg/dL — AB (ref 65–99)
Potassium: 2.8 mmol/L — ABNORMAL LOW (ref 3.5–5.1)
Sodium: 129 mmol/L — ABNORMAL LOW (ref 135–145)

## 2017-02-07 LAB — POCT I-STAT 3, VENOUS BLOOD GAS (G3P V)
Acid-Base Excess: 10 mmol/L — ABNORMAL HIGH (ref 0.0–2.0)
Acid-Base Excess: 10 mmol/L — ABNORMAL HIGH (ref 0.0–2.0)
BICARBONATE: 35.3 mmol/L — AB (ref 20.0–28.0)
Bicarbonate: 35.7 mmol/L — ABNORMAL HIGH (ref 20.0–28.0)
O2 Saturation: 50 %
O2 Saturation: 53 %
PCO2 VEN: 48 mmHg (ref 44.0–60.0)
TCO2: 37 mmol/L (ref 0–100)
TCO2: 37 mmol/L (ref 0–100)
pCO2, Ven: 47.9 mmHg (ref 44.0–60.0)
pH, Ven: 7.475 — ABNORMAL HIGH (ref 7.250–7.430)
pH, Ven: 7.48 — ABNORMAL HIGH (ref 7.250–7.430)
pO2, Ven: 25 mmHg — CL (ref 32.0–45.0)
pO2, Ven: 27 mmHg — CL (ref 32.0–45.0)

## 2017-02-07 LAB — COOXEMETRY PANEL
Carboxyhemoglobin: 1.3 % (ref 0.5–1.5)
Methemoglobin: 1 % (ref 0.0–1.5)
O2 Saturation: 55.3 %
Total hemoglobin: 14.3 g/dL (ref 12.0–16.0)

## 2017-02-07 LAB — GLUCOSE, CAPILLARY
GLUCOSE-CAPILLARY: 326 mg/dL — AB (ref 65–99)
Glucose-Capillary: 156 mg/dL — ABNORMAL HIGH (ref 65–99)
Glucose-Capillary: 194 mg/dL — ABNORMAL HIGH (ref 65–99)
Glucose-Capillary: 322 mg/dL — ABNORMAL HIGH (ref 65–99)

## 2017-02-07 LAB — MAGNESIUM: MAGNESIUM: 2.1 mg/dL (ref 1.7–2.4)

## 2017-02-07 SURGERY — RIGHT HEART CATH
Anesthesia: LOCAL

## 2017-02-07 MED ORDER — AMIODARONE HCL 200 MG PO TABS: ORAL_TABLET | ORAL | 3 refills | Status: DC

## 2017-02-07 MED ORDER — LIDOCAINE HCL (PF) 1 % IJ SOLN
INTRAMUSCULAR | Status: DC | PRN
  Administered 2017-02-07: 15 mL

## 2017-02-07 MED ORDER — ONDANSETRON HCL 4 MG/2ML IJ SOLN: 4.0000 mg | Freq: Four times a day (QID) | INTRAMUSCULAR | Status: DC | PRN

## 2017-02-07 MED ORDER — AMIODARONE HCL 200 MG PO TABS: 400.0000 mg | ORAL_TABLET | Freq: Two times a day (BID) | ORAL | 3 refills | Status: DC

## 2017-02-07 MED ORDER — POTASSIUM CHLORIDE 10 MEQ/50ML IV SOLN
10.0000 meq | Freq: Once | INTRAVENOUS | Status: AC
  Administered 2017-02-07: 10 meq via INTRAVENOUS
  Filled 2017-02-07: qty 50

## 2017-02-07 MED ORDER — MILRINONE LACTATE IN DEXTROSE 20-5 MG/100ML-% IV SOLN: 0.3750 ug/kg/min | INTRAVENOUS | 0 refills | Status: DC

## 2017-02-07 MED ORDER — POTASSIUM CHLORIDE CRYS ER 20 MEQ PO TBCR: 40.0000 meq | EXTENDED_RELEASE_TABLET | Freq: Two times a day (BID) | ORAL | Status: DC

## 2017-02-07 MED ORDER — HEPARIN (PORCINE) IN NACL 2-0.9 UNIT/ML-% IJ SOLN
INTRAMUSCULAR | Status: AC
  Filled 2017-02-07: qty 500

## 2017-02-07 MED ORDER — POTASSIUM CHLORIDE 10 MEQ/50ML IV SOLN
10.0000 meq | INTRAVENOUS | Status: AC
  Administered 2017-02-07 (×3): 10 meq via INTRAVENOUS
  Filled 2017-02-07 (×4): qty 50

## 2017-02-07 MED ORDER — POTASSIUM CHLORIDE CRYS ER 20 MEQ PO TBCR
40.0000 meq | EXTENDED_RELEASE_TABLET | Freq: Once | ORAL | Status: AC
  Administered 2017-02-07: 40 meq via ORAL
  Filled 2017-02-07: qty 2

## 2017-02-07 MED ORDER — SODIUM CHLORIDE 0.9% FLUSH: 3.0000 mL | INTRAVENOUS | Status: DC | PRN

## 2017-02-07 MED ORDER — SODIUM CHLORIDE 0.9 % IV SOLN: INTRAVENOUS | Status: DC

## 2017-02-07 MED ORDER — SODIUM CHLORIDE 0.9 % IV SOLN: 250.0000 mL | INTRAVENOUS | Status: DC | PRN

## 2017-02-07 MED ORDER — POTASSIUM CHLORIDE CRYS ER 20 MEQ PO TBCR
80.0000 meq | EXTENDED_RELEASE_TABLET | Freq: Once | ORAL | Status: AC
  Administered 2017-02-07: 80 meq via ORAL
  Filled 2017-02-07: qty 4

## 2017-02-07 MED ORDER — POTASSIUM CHLORIDE CRYS ER 20 MEQ PO TBCR: EXTENDED_RELEASE_TABLET | ORAL | 3 refills | Status: DC

## 2017-02-07 MED ORDER — SPIRONOLACTONE 25 MG PO TABS
25.0000 mg | ORAL_TABLET | Freq: Two times a day (BID) | ORAL | 6 refills | Status: DC
Start: 2017-02-07 — End: 2017-03-13

## 2017-02-07 MED ORDER — HEPARIN (PORCINE) IN NACL 2-0.9 UNIT/ML-% IJ SOLN
INTRAMUSCULAR | Status: AC | PRN
  Administered 2017-02-07: 500 mL

## 2017-02-07 MED ORDER — SODIUM CHLORIDE 0.9% FLUSH: 3.0000 mL | Freq: Two times a day (BID) | INTRAVENOUS | Status: DC

## 2017-02-07 MED ORDER — POTASSIUM CHLORIDE CRYS ER 20 MEQ PO TBCR
40.0000 meq | EXTENDED_RELEASE_TABLET | Freq: Two times a day (BID) | ORAL | Status: DC
  Administered 2017-02-07: 40 meq via ORAL
  Filled 2017-02-07: qty 2

## 2017-02-07 MED ORDER — ONDANSETRON HCL 4 MG/2ML IJ SOLN
INTRAMUSCULAR | Status: AC
  Filled 2017-02-07: qty 2

## 2017-02-07 MED ORDER — ACETAMINOPHEN 325 MG PO TABS: 650.0000 mg | ORAL_TABLET | ORAL | Status: DC | PRN

## 2017-02-07 MED ORDER — LIDOCAINE HCL 1 % IJ SOLN
INTRAMUSCULAR | Status: AC
  Filled 2017-02-07: qty 20

## 2017-02-07 SURGICAL SUPPLY — 7 items
CATH SWAN GANZ 7F STRAIGHT (CATHETERS) ×1 IMPLANT
HOVERMATT SINGLE USE (MISCELLANEOUS) ×1 IMPLANT
KIT HEART RIGHT NAMIC (KITS) ×1 IMPLANT
PACK CARDIAC CATHETERIZATION (CUSTOM PROCEDURE TRAY) ×2 IMPLANT
SHEATH PINNACLE 7F 10CM (SHEATH) ×2 IMPLANT
TRANSDUCER W/STOPCOCK (MISCELLANEOUS) ×2 IMPLANT
WIRE EMERALD 3MM-J .025X260CM (WIRE) ×1 IMPLANT

## 2017-02-07 NOTE — Progress Notes (Signed)
Advanced Heart Failure Rounding Note  PCP:  [name] NP Primary Cardiologist: Dr Haroldine Laws Endocrinologist: Dr Loanne Drilling Oncologist: Dr Lindi Adie GI: Dr Havery Moros Subjective:    Admitted 01/28/17 after [place], started on milrinone for reduced cardiac output, volume overload.   Developed Afib RVR, s/p DCCV 02/02/17, remained in NSR until 02/05/17. Had DCCV x 2 yesterday, unsuccessful. Plan for right heart cath today.   Weight up a pound overnight, but had excellent diuresis in the past 24 hours. CVP inaccurate this am. RN will flush line and repeat CVP.  Feels tired today, no SOB at rest, chest pain or palpitations.   Objective:   Weight Range: 202 lb 3.2 oz (91.7 kg) Body mass index is 33.65 kg/m.   Vital Signs:   Temp:  [97.6 F (36.4 C)-98.6 F (37 C)] 97.6 F (36.4 C) (07/06 0331) Pulse Rate:  [96-114] 98 (07/06 0331) Resp:  [12-28] 17 (07/06 0331) BP: (92-109)/(54-71) 102/70 (07/06 0331) SpO2:  [80 %-100 %] 99 % (07/06 0331) Weight:  [202 lb 3.2 oz (91.7 kg)] 202 lb 3.2 oz (91.7 kg) (07/06 0331) Last BM Date: 01/31/17  Weight change: Filed Weights   02/05/17 0447 02/06/17 0425 02/07/17 0331  Weight: 203 lb 1.6 oz (92.1 kg) 201 lb 1.6 oz (91.2 kg) 202 lb 3.2 oz (91.7 kg)    Intake/Output:   Intake/Output Summary (Last 24 hours) at 02/07/17 0724 Last data filed at 02/07/17 0353  Gross per 24 hour  Intake           1469.8 ml  Output             4550 ml  Net          -3080.2 ml      Physical Exam     General:Elderly female, NAD.  HEENT: Normal Neck: Supple. JVP to jaw.  Carotids 2+ bilat; no bruits. No thyromegaly or nodule noted. Cor: PMI nondisplaced. Tachy, regular rhythm.  No M/G/R noted Lungs: Clear in upper lobes, diminished in bilateral bases. No wheezing.  Abdomen: Soft, non-tender, + distended, no HSM. No bruits or masses. +BS  Extremities: No cyanosis, clubbing, rash, trace pedal edema  Neuro: Alert & orientedx3, cranial nerves grossly intact. moves  all 4 extremities w/o difficulty. Affect pleasant     Telemetry   Possible A flutter rate 100-120     EKG    N/A  Labs    Basic Metabolic Panel  Recent Labs  02/06/17 0347 02/06/17 1058 02/06/17 1119 02/07/17 0359  NA 133*  --  136 129*  K 2.3*  --  2.9* 2.8*  CL 86*  --  85* 85*  CO2 38*  --   --  35*  GLUCOSE 98  --  157* 126*  BUN 28*  --  32* 31*  CREATININE 1.04*  --  1.20* 1.16*  CALCIUM 9.7  --   --  9.6  MG  --  1.8  --  2.1     BNP: BNP (last 3 results)  Recent Labs  07/23/16 1200 12/21/16 1054  BNP 182.4* 474.9*        Imaging   Right/Left Heart Cath and Coronary Angiography 01/29/17 Findings:  Ao = 96/64 (76) LV = 96/37 RA =  20 RV =  54/21 PA =  59/30 (39) PCW = 33 Fick cardiac output/index = 3.1/1.6 PVR = 1.9 WU FA sat = 100% PA sat = 52%, 54% RA/PCWP = 0.61 PAPi = 1.45  Assessment:  1. Minimal  non-obstructive CAD 2. Severe NICM with EF 10-15% 3. Elevated filling pressures with biventricular failure and severely reduced cardiac output   Medications:     Scheduled Medications: . apixaban  5 mg Oral BID  . busPIRone  30 mg Oral BID  . digoxin  0.125 mg Oral QODAY  . docusate sodium  300 mg Oral QHS  . DULoxetine  30 mg Oral Daily  . furosemide  80 mg Intravenous BID  . hydrocerin   Topical BID  . insulin aspart  0-15 Units Subcutaneous TID WC  . insulin aspart protamine- aspart  60 Units Subcutaneous BID WC  . ivabradine  2.5 mg Oral BID WC  . levothyroxine  75 mcg Oral QAC breakfast  . metolazone  5 mg Oral Daily  . pantoprazole  40 mg Oral BID  . rOPINIRole  1 mg Oral QHS,MR X 1  . rosuvastatin  10 mg Oral QHS  . sodium chloride flush  3 mL Intravenous Q12H  . sodium chloride flush  3 mL Intravenous Q12H  . sodium chloride flush  3 mL Intravenous Q12H  . spironolactone  25 mg Oral Daily  . tiotropium  18 mcg Inhalation Daily    Infusions: . sodium chloride    . sodium chloride 10 mL/hr at 01/28/17  0655  . sodium chloride    . sodium chloride    . amiodarone 30 mg/hr (02/07/17 0219)  . milrinone 0.375 mcg/kg/min (02/06/17 2156)    PRN Medications: sodium chloride, sodium chloride, sodium chloride, acetaminophen, albuterol, ALPRAZolam, baclofen, hydrOXYzine, lidocaine, ondansetron (ZOFRAN) IV, sodium chloride flush, sodium chloride flush, sodium chloride flush, SUMAtriptan, traMADol    Patient Profile   [name] is a 61 year old with a history of R 2005/2008 and L 2017  breast cancer, HTN, DM, IBS, hyperlipidemia, chronic systolic heart failure, NICM chemo induced cardiomyopathy, St Jude ICD, and GI bleed S/B AVMs. R Breast cancer treatment with adriamycin in 2005. L breast cancer treatment with lumpectomy and radiation.   Assessment/Plan   1. A/C Systolic Heart Failure -> cardiogenic shock: NICM, chemo induced cardiomyopathy. Echo 12/2016 EF 15%, RV mild to moderate HK, s/p ICD.  - Co ox 55% on 0.375 milrinone.  - Volume status improving, but remains volume overloaded.  - Continue IV lasix + metolazone this am. Will await RHC results to further adjust diuretics.  - No beta blocker with decompensation.  - Continue digoxin 0.125 mg - Continue Spiro 25 mg daily.  -- She is being considered for LVAD for DT. Has tunneled PICC for home milrinone. Prior to VAD will need to better evaluate for recurrent breast CA (see below) and if RV is suitable.  - Plan for RHC today - Will go home on milrinone, orders placed. [facility] notified  2. Atrial fibrillation with RVR  - s/p DCCV 02/02/17, developed recurrent Aflutter on 02/05/17. DCCV x 2 yesterday, not successful.  - Continue IV Amiodarone.   3. Hypothyroidism - TSH 4.661 - Continue synthroid.  - No change to current plan.   4. H/O R and L Breast Cancer:  - R breast treated with adriamycin 2005. L breast treated with lumpectomy and radiation.  - Multiple LN's and LUL mass concerning for recurrent breast CA. Needs outpatient PET scan once  she is d/c'd.  -Dr. Haroldine Laws has discussed with Dr. Lindi Adie - No change to current plan.   5. H/O GI Bleed - multiple scopes with most recent 03/2016. Has h/o polyps and AVMs.  - No further  6. DM - Continue SSI  - No change to current plan.   7. OSA - Continue CPAP hs     8. H/O NSVT - Continue Amiodarone.     9. Hypokalemia - K 2.8.  - Give 4 runs KCL this am.  - Continue Kcl 40 mEq BID.   Length of Stay: [place], NP  02/07/2017, 7:24 AM  Advanced Heart Failure Team Pager [phone] (M-F; 7a - 4p)  Please contact [facility] for night-coverage after hours (4p -7a ) and weekends on [email or web address]   Patient seen and examined with Jettie Booze, NP. We discussed all aspects of the encounter. I agree with the assessment and plan as stated above.   Remains on milrinone. Fatigued today. Volume status still seems mildly elevated. Had DC-CV yesterday. Rhythm unclear. ?atrial flutter vs atrial tach. Will get ECG prior to d/c. Will proceed with RHC this am and hopefully home later today after bedrest. Supp K aggressively.   Glori Bickers, MD  8:20 AM

## 2017-02-07 NOTE — Progress Notes (Signed)
Pt ambulated to the bathroom. Right groin level 0.

## 2017-02-07 NOTE — Progress Notes (Signed)
Cardiology brief progress note  Pt with persistent hypokalemia, K 2.8 this AM. Will given another dose of 80 mEq x 1 this AM.   Regino Bellow, DO 5:24 AM

## 2017-02-07 NOTE — Progress Notes (Signed)
Site area: rt groin fv sheath Site Prior to Removal:  Level 0 Pressure Applied For: 15 minutes Manual:   yes Patient Status During Pull:  stable Post Pull Site:  Level 0 Post Pull Instructions Given:  yes Post Pull Pulses Present: palpable Dressing Applied:  Gauze and tegaderm Bedrest begins @ 0761 Comments:

## 2017-02-07 NOTE — Interval H&P Note (Signed)
History and Physical Interval Note:  02/07/2017 8:15 AM  [name]  has presented today for surgery, with the diagnosis of HF  The various methods of treatment have been discussed with the patient and family. After consideration of risks, benefits and other options for treatment, the patient has consented to  Procedure(s): Right Heart Cath (N/A) as a surgical intervention .  The patient's history has been reviewed, patient examined, no change in status, stable for surgery.  I have reviewed the patient's chart and labs.  Questions were answered to the patient's satisfaction.     [place], Quillian Quince

## 2017-02-07 NOTE — Progress Notes (Signed)
Discharge instructions reviewed pt. Pt has no questions at this time. Pt hooked up to her home Milirone from [facility]. Groin, level 0. Pt denies any pain. Pt waiting on her sister to pick her up.

## 2017-02-07 NOTE — Progress Notes (Addendum)
Double lumen PIC line rt IJ  w/Milrinone and Amiodarone infusing to purple port, potassium to red port on arrival to holding

## 2017-02-07 NOTE — Discharge Summary (Signed)
Advanced Heart Failure Discharge Note  Discharge Summary   Patient ID: LADAN VANDERZANDEN MRN: [id number], DOB/AGE: [birth year] 61 y.o. Admit date: 01/28/2017 D/C date:     02/07/2017   Primary Discharge Diagnoses:  1. Acute on chronic systolic HF -> Cardiogenic shock 2. Afib with RVR 3. Hypothyroidism 4. H/o R and L Breast Cancer 5. H/o GI bleed 6. DM2 7. OSA 8. H/o NSVT 9. Hypokalemia  Hospital Course:  Ms Slaven is a 61 year old with a history of R 2005/2008 and L 2017  breast cancer, HTN, DM, IBS, hyperlipidemia, chronic systolic heart failure, NICM chemo induced cardiomyopathy, [employer] ICD, and GI bleed S/B AVMs. R Breast cancer treatment with adriamycin in 2005. L breast cancer treatment with lumpectomy and radiation.   Admitted from cath lab 01/28/17 after [facility] showed elevated filling pressures and severely reduced cardiac output, as well as minimal, non-obstructive CAD. Full report below.   Pt diuresed 19.9L, though weight only down 7 lbs from highest weight this admission. HF meds adjusted as tolerated.   Hospital course complicated by Afib RVR requiring DCCV 02/02/17 after load of IV amiodarone. She remained in NSR until 02/05/17. DCCV attempted x 2 02/06/17 which was unsuccessful.   Multiple lymph nodes and LUL mass concerning for recurrent breast CA noted on recent chest CT. Plan for for outpatient PET scan.   Pt also experienced hypokalemia requiring aggressive supplementation. K aggressively supped on day of discharge.   [place] 02/07/17 with persistent biventricular failure with elevated filling pressures, mild improvement with milrinone. RV indices borderline for mechanical support. Full results as below.   After cath as above, pt though to be stable, though tenuous, for home with med adjustments and close follow up as below. She will be discharged to home in stable but tenuous condition on milrinone 0.375 mcg/kg/min to be provided by [facility].   With aggressive K supp on discharge,  will check BMET 02/10/17 in clinic  Discharge Weight Range: 202 lbs Discharge Vitals: Blood pressure 123/72, pulse (!) 103, temperature 97.6 F (36.4 C), temperature source Oral, resp. rate (!) 23, height 5\' 5"  (1.651 m), weight 202 lb 3.2 oz (91.7 kg), SpO2 93 %.  Labs: Lab Results  Component Value Date   WBC 7.4 02/05/2017   HGB 16.3 (H) 02/06/2017   HCT 48.0 (H) 02/06/2017   MCV 90.9 02/05/2017   PLT 216 02/05/2017     Recent Labs Lab 02/07/17 0359  NA 129*  K 2.8*  CL 85*  CO2 35*  BUN 31*  CREATININE 1.16*  CALCIUM 9.6  GLUCOSE 126*   Lab Results  Component Value Date   CHOL 83 01/28/2017   HDL 25 (L) 01/28/2017   LDLCALC 46 01/28/2017   TRIG 58 01/28/2017   BNP (last 3 results)  Recent Labs  07/23/16 1200 12/21/16 1054  BNP 182.4* 474.9*    ProBNP (last 3 results) No results for input(s): PROBNP in the last 8760 hours.   Diagnostic Studies/Procedures   RHC/LHC 01/28/2017 Ao = 96/64 (76) LV = 96/37 RA = 20 RV = 54/21 PA = 59/30 (39) PCW = 33 Fick cardiac output/index = 3.1/1.6 PVR = 1.9 WU FA sat = 100% PA sat = 52%, 54% RA/PCWP = 0.61 PAPi = 1.45 Assessment: 1. Minimal non-obstructive CAD 2. Severe NICM with EF 10-15% 3. Elevated filling pressures with biventricular failure and severely reduced cardiac output  RHC 02/07/17 RA = 16 RV = 52/16 PA = 54/27 (40) PCW = 29  Fick  cardiac output/index = 3.5/1.8 PVR = 3.1 WU FA sat = 93% PA sat = 50%, 53% PaPi = 1.7 RA/PCW = 0.55  Assessment: 1. Persistent severe biventricular failure with elevated filling pressures 2. Mild improvement with milrinone 3. RV indices are borderline for mechanical support  Discharge Medications   Allergies as of 02/07/2017      Reactions   Bee Venom Anaphylaxis   Aspirin Hives   Ciprofloxacin Hives   Codeine Nausea Only   "can take ONLY if she eats with med"   Naproxen Hives   Other Other (See Comments)   ANY TYPES OF METAL-CAUSES BLISTERS SKIN  AND ITCHING   Sulfonamide Derivatives Hives   Xarelto [rivaroxaban] Hives   Atorvastatin Itching   Nsaids Itching, Rash   Tape Rash   Also allergic to metal   Tresiba Flextouch [insulin Degludec] Itching, Rash      Medication List    STOP taking these medications   carvedilol 6.25 MG tablet Commonly known as:  COREG   furosemide 20 MG tablet Commonly known as:  LASIX   ivabradine 5 MG Tabs tablet Commonly known as:  CORLANOR   sacubitril-valsartan 49-51 MG Commonly known as:  ENTRESTO     TAKE these medications   acetaminophen 650 MG CR tablet Commonly known as:  TYLENOL Take 1,300 mg by mouth every 8 (eight) hours as needed for pain.   ALPRAZolam 0.5 MG tablet Commonly known as:  XANAX Take 0.25 mg by mouth 4 (four) times daily as needed for anxiety.   amiodarone 200 MG tablet Commonly known as:  PACERONE Take 2 tablets (400 mg total) twice daily for one week. Then on 02/14/17, decrease to 1 tablet (200 mg total) twice daily. What changed:  how much to take  how to take this  when to take this  additional instructions   apixaban 5 MG Tabs tablet Commonly known as:  ELIQUIS Take 1 tablet (5 mg total) by mouth 2 (two) times daily.   baclofen 20 MG tablet Commonly known as:  LIORESAL Take 20 mg by mouth 2 (two) times daily as needed for muscle spasms.   busPIRone 30 MG tablet Commonly known as:  BUSPAR Take 30 mg by mouth 2 (two) times daily.   CALCIUM 600+D3 600-800 MG-UNIT Tabs Generic drug:  Calcium Carb-Cholecalciferol Take by mouth daily.   Calcium-Magnesium-Zinc Tabs Take 1 tablet by mouth daily after lunch.   digoxin 0.125 MG tablet Commonly known as:  LANOXIN Take 1 tablet (0.125 mg total) by mouth every other day.   docusate sodium 100 MG capsule Commonly known as:  COLACE Take 3 capsules (300 mg total) by mouth at bedtime.   DULoxetine 30 MG capsule Commonly known as:  CYMBALTA Take 30 mg by mouth daily.   Fish Oil 1000 MG  Caps Take 2 capsules by mouth daily.   Garlic 989 MG Caps Take 500 mg by mouth daily after lunch.   GLUCOSAMINE CHONDROITIN JOINT PO Take 1 tablet by mouth daily.   HUMALOG KWIKPEN 100 UNIT/ML KiwkPen Generic drug:  insulin lispro Inject 14-24 Units into the skin 3 (three) times daily. On average per patient 24 unit with breakfast, 16 units with lunch, & 14-16 units with supper   HUMALOG MIX 75/25 KWIKPEN (75-25) 100 UNIT/ML Kwikpen Generic drug:  Insulin Lispro Prot & Lispro Inject 60 Units into the skin 2 (two) times daily.   hydrOXYzine 50 MG tablet Commonly known as:  ATARAX/VISTARIL Take 50 mg by mouth every 6 (six)  hours as needed for itching ((primarily taken at bedtime)).   IRON PO Take 24 mg by mouth daily.   levothyroxine 75 MCG tablet Commonly known as:  SYNTHROID, LEVOTHROID Take 75 mcg by mouth daily before breakfast.   Melatonin 1 MG Tabs Take 0.5 mg by mouth at bedtime.   metolazone 2.5 MG tablet Commonly known as:  ZAROXOLYN Take 1 tablet (2.5 mg total) by mouth as needed (for weight gain >3 lbs).   milrinone 20 MG/100 ML Soln infusion Commonly known as:  PRIMACOR Inject 35.2875 mcg/min into the vein continuous.   mometasone 50 MCG/ACT nasal spray Commonly known as:  NASONEX Place 1 spray into both nostrils daily. allergies   nitroGLYCERIN 0.4 MG SL tablet Commonly known as:  NITROSTAT Place 1 tablet (0.4 mg total) under the tongue every 5 (five) minutes as needed. For chest pain   pantoprazole 40 MG tablet Commonly known as:  PROTONIX Take 40 mg by mouth 2 (two) times daily.   polyethylene glycol powder powder Commonly known as:  GLYCOLAX/MIRALAX Mix 17 grams in 8 oz of water twice daily   potassium chloride SA 20 MEQ tablet Commonly known as:  K-DUR,KLOR-CON Take 60 meq (3 tabs) q am and 80 meq (4 tabs) q pm. What changed:  how much to take  how to take this  when to take this  additional instructions   PROVENTIL HFA 108 (90 Base)  MCG/ACT inhaler Generic drug:  albuterol Inhale 2 puffs into the lungs every 6 (six) hours as needed. For shortness of breath   pyridOXINE 100 MG tablet Commonly known as:  VITAMIN B-6 Take 100 mg by mouth daily after lunch.   rOPINIRole 1 MG tablet Commonly known as:  REQUIP Take 1 mg by mouth at bedtime and may repeat dose one time if needed. IF RESTLESS LEGS PERSIST PATIENT MAY REPEAT ADDITIONAL DOSE   rosuvastatin 10 MG tablet Commonly known as:  CRESTOR Take 10 mg by mouth at bedtime.   Simethicone Extra Strength 125 MG Caps Take 1 tablet by mouth daily as needed (for bloating). Reported on 02/23/2016   spironolactone 25 MG tablet Commonly known as:  ALDACTONE Take 1 tablet (25 mg total) by mouth 2 (two) times daily. What changed:  when to take this   SUMAtriptan 100 MG tablet Commonly known as:  IMITREX Take 100 mg by mouth daily as needed for migraine.   SYSTANE BALANCE 0.6 % Soln Generic drug:  Propylene Glycol Place 1 drop into both eyes 3 (three) times daily.   tiotropium 18 MCG inhalation capsule Commonly known as:  SPIRIVA Place 18 mcg into inhaler and inhale daily.   traMADol 50 MG tablet Commonly known as:  ULTRAM Take 1 tablet by mouth daily as needed for moderate pain.   vitamin B-12 1000 MCG tablet Commonly known as:  CYANOCOBALAMIN Take 1,000 mcg by mouth daily after lunch.   vitamin C 500 MG tablet Commonly known as:  ASCORBIC ACID Take 500 mg by mouth daily after lunch.   Vitamin D 2000 units Caps Take 1 capsule by mouth daily.   vitamin E 400 UNIT capsule Take 400 Units by mouth daily after lunch.            Durable Medical Equipment        Start     Ordered   02/07/17 1503  Heart failure home health orders  (Heart failure home health orders / Face to face)  Once    Comments:  Heart Failure  Follow-up Care:  Verify follow-up appointments per Patient Discharge Instructions. Confirm transportation arranged. Reconcile home medications  with discharge medication list. Remove discontinued medications from use. Assist patient/caregiver to manage medications using pill box. Reinforce low sodium food selection Assessments: Vital signs and oxygen saturation at each visit. Assess home environment for safety concerns, caregiver support and availability of low-sodium foods. Consult Education officer, museum, PT/OT, Dietitian, and CNA based on assessments. Perform comprehensive cardiopulmonary assessment. Notify MD for any change in condition or weight gain of 3 pounds in one day or 5 pounds in one week with symptoms. Daily Weights and Symptom Monitoring: Ensure patient has access to scales. Teach patient/caregiver to weigh daily before breakfast and after voiding using same scale and record.    Teach patient/caregiver to track weight and symptoms and when to notify Provider. Activity: Develop individualized activity plan with patient/caregiver.  Milrinone 0.375 mcg/kg/min x 12 months.  Question Answer Comment  Heart Failure Follow-up Care [facility] at [place] Visits Set up telemonitoring equipment to monitor daily vital signs, weights and oxygen saturation   Obtain the following labs Basic Metabolic Panel   Lab frequency Weekly   Fax lab results to AHF Clinic at [phone]   Diet Low Sodium Heart Healthy   Fluid restrictions: 2000 mL Fluid   Initiate Heart Failure Clinic Diuretic Protocol to be used by [place] only ( to be ordered by Heart Failure Team Providers Only) Yes      02/07/17 1502   02/07/17 1002  Heart failure home health orders  (Heart failure home health orders / Face to face)  Once    Comments:  Heart Failure Follow-up Care:  Verify follow-up appointments per Patient Discharge Instructions. Confirm transportation arranged. Reconcile home medications with discharge medication list. Remove discontinued medications from use. Assist patient/caregiver to manage  medications using pill box. Reinforce low sodium food selection Assessments: Vital signs and oxygen saturation at each visit. Assess home environment for safety concerns, caregiver support and availability of low-sodium foods. Consult Education officer, museum, PT/OT, Dietitian, and CNA based on assessments. Perform comprehensive cardiopulmonary assessment. Notify MD for any change in condition or weight gain of 3 pounds in one day or 5 pounds in one week with symptoms. Daily Weights and Symptom Monitoring: Ensure patient has access to scales. Teach patient/caregiver to weigh daily before breakfast and after voiding using same scale and record.    Teach patient/caregiver to track weight and symptoms and when to notify Provider. Activity: Develop individualized activity plan with patient/caregiver.   Milrinone 0.375 mcg/kg/min x 12 months.  Question Answer Comment  Heart Failure Follow-up Care [facility] ([facility] at [phone]   Obtain the following labs Basic Metabolic Panel   Lab frequency Weekly   Fax lab results to AHF Clinic at [phone]   Diet Low Sodium Heart Healthy   Fluid restrictions: 2000 mL Fluid      02/07/17 1002      Disposition   The patient will be discharged in stable condition to home. Discharge Instructions    (HEART FAILURE PATIENTS) Call MD:  Anytime you have any of the following symptoms: 1) 3 pound weight gain in 24 hours or 5 pounds in 1 week 2) shortness of breath, with or without a dry hacking cough 3) swelling in the hands, feet or stomach 4) if you have to sleep on extra pillows at night in order to breathe.    Complete by:  As directed  AMB Referral to [facility]    Complete by:  As directed    Please assign to [place]. Was active with [place]. Currently at [facility]. Likley dc on Friday. Written consent obtained. Please see latest liaison notes. Will have home health and will go home on iv milnirone. . Thanks  Marthenia Rolling, [place], RN,BSN-THN [place]   Reason for consult:  Please assign to [facility] RNCM   Diagnoses of:  Heart Failure   Expected date of contact:  1-3 days (reserved for hospital discharges)   Diet - low sodium heart healthy    Complete by:  As directed    Heart Failure patients record your daily weight using the same scale at the same time of day    Complete by:  As directed    Increase activity slowly    Complete by:  As directed    STOP any activity that causes chest pain, shortness of breath, dizziness, sweating, or exessive weakness    Complete by:  As directed      Follow-up Information    [facility] Follow [facility].   Why:  Registered Nurse.  Contact information: [place] [id number] [phone]        Kamilia Carollo, Shaune Pascal, MD Follow up on 02/13/2017.   Specialty:  Cardiology Why:  at 1040 am for post hospital follow up. Code for parking 7000. Please bring all of your medications to your visit.  Contact information: [place] [address] [facility] Follow up on 02/10/2017.   Specialty:  Cardiology Why:  at 0915 for [place] (Dr. Gillermina Hu office). Code for parking 7000 Contact information: [address] [id number] [place] [phone]            Duration of Discharge Encounter: Greater than 35 minutes   Signed, Annamaria Helling 02/07/2017, 3:39 PM  Patient seen and examined with the above-signed Advanced Practice Provider and/or Housestaff. I personally reviewed laboratory data, imaging studies and relevant notes. I independently examined the patient and formulated the important aspects of the plan. I have edited the note to reflect any of my changes or salient points. I have personally discussed the plan with the patient and/or  family.  Will plan d/c today on milrinone with close f/u. Will need to schedule outpatient PET scan.   Glori Bickers, MD  5:27 PM

## 2017-02-07 NOTE — Progress Notes (Addendum)
Chaplain received a page that patient was ready for AD to be notarized.  Chaplain went to room to make sure proper paperwork had been completed with exception of notary page. Chaplain talked with the patient to make she knew what the paperwork allowed.  Chaplain will proceed with getting everyone at the room to take care of completion.    02/07/17 1355  Clinical Encounter Type  Visited With Patient  Visit Type Initial;Other (Comment) (Advanced Directive Consult)  Referral From Nurse  Consult/Referral To Chaplain  Spiritual Encounters  Spiritual Needs Literature    Unable to find notary and available two witnesses for completion of AD.  Patient said she will get it done on Monday.  Patient is welcome to call us and have [place] meet her while here if she has to return to any part of the hospital for any service.  We can help.  Staff is limited because of holiday week.

## 2017-02-07 NOTE — Care Management Important Message (Signed)
Important Message  Patient Details  Name: [name] MRN: [id number] Date of Birth: 06-25-1956   Medicare Important Message Given:  Yes    Orbie Pyo 02/07/2017, 12:06 PM

## 2017-02-08 DIAGNOSIS — Z451 Encounter for adjustment and management of infusion pump: Secondary | ICD-10-CM | POA: Diagnosis not present

## 2017-02-08 DIAGNOSIS — I110 Hypertensive heart disease with heart failure: Secondary | ICD-10-CM | POA: Diagnosis not present

## 2017-02-08 DIAGNOSIS — I5023 Acute on chronic systolic (congestive) heart failure: Secondary | ICD-10-CM | POA: Diagnosis not present

## 2017-02-08 DIAGNOSIS — Z7901 Long term (current) use of anticoagulants: Secondary | ICD-10-CM | POA: Diagnosis not present

## 2017-02-08 DIAGNOSIS — I481 Persistent atrial fibrillation: Secondary | ICD-10-CM | POA: Diagnosis not present

## 2017-02-10 ENCOUNTER — Other Ambulatory Visit: Payer: Self-pay | Admitting: *Deleted

## 2017-02-10 ENCOUNTER — Telehealth: Payer: Self-pay | Admitting: Cardiology

## 2017-02-10 ENCOUNTER — Ambulatory Visit (HOSPITAL_COMMUNITY)
Admit: 2017-02-10 | Discharge: 2017-02-10 | Disposition: A | Discharge status: Home / Self Care | Payer: Medicare Other | Attending: Internal Medicine | Admitting: Internal Medicine

## 2017-02-10 ENCOUNTER — Other Ambulatory Visit (HOSPITAL_COMMUNITY): Payer: Self-pay | Admitting: *Deleted

## 2017-02-10 DIAGNOSIS — E875 Hyperkalemia: Secondary | ICD-10-CM | POA: Diagnosis not present

## 2017-02-10 DIAGNOSIS — E871 Hypo-osmolality and hyponatremia: Secondary | ICD-10-CM | POA: Diagnosis not present

## 2017-02-10 DIAGNOSIS — N179 Acute kidney failure, unspecified: Secondary | ICD-10-CM | POA: Diagnosis not present

## 2017-02-10 DIAGNOSIS — I5023 Acute on chronic systolic (congestive) heart failure: Secondary | ICD-10-CM | POA: Diagnosis not present

## 2017-02-10 DIAGNOSIS — R918 Other nonspecific abnormal finding of lung field: Secondary | ICD-10-CM | POA: Diagnosis not present

## 2017-02-10 DIAGNOSIS — I4891 Unspecified atrial fibrillation: Secondary | ICD-10-CM

## 2017-02-10 DIAGNOSIS — I5020 Unspecified systolic (congestive) heart failure: Secondary | ICD-10-CM | POA: Diagnosis not present

## 2017-02-10 DIAGNOSIS — Z7901 Long term (current) use of anticoagulants: Secondary | ICD-10-CM | POA: Diagnosis not present

## 2017-02-10 DIAGNOSIS — Z87891 Personal history of nicotine dependence: Secondary | ICD-10-CM | POA: Diagnosis not present

## 2017-02-10 DIAGNOSIS — J449 Chronic obstructive pulmonary disease, unspecified: Secondary | ICD-10-CM | POA: Diagnosis not present

## 2017-02-10 DIAGNOSIS — I2510 Atherosclerotic heart disease of native coronary artery without angina pectoris: Secondary | ICD-10-CM | POA: Diagnosis not present

## 2017-02-10 DIAGNOSIS — L732 Hidradenitis suppurativa: Secondary | ICD-10-CM | POA: Diagnosis not present

## 2017-02-10 DIAGNOSIS — I427 Cardiomyopathy due to drug and external agent: Secondary | ICD-10-CM | POA: Diagnosis not present

## 2017-02-10 DIAGNOSIS — I252 Old myocardial infarction: Secondary | ICD-10-CM | POA: Diagnosis not present

## 2017-02-10 DIAGNOSIS — E785 Hyperlipidemia, unspecified: Secondary | ICD-10-CM | POA: Diagnosis not present

## 2017-02-10 DIAGNOSIS — L02422 Furuncle of left axilla: Secondary | ICD-10-CM | POA: Diagnosis not present

## 2017-02-10 DIAGNOSIS — I480 Paroxysmal atrial fibrillation: Secondary | ICD-10-CM | POA: Diagnosis not present

## 2017-02-10 DIAGNOSIS — Z79899 Other long term (current) drug therapy: Secondary | ICD-10-CM | POA: Diagnosis not present

## 2017-02-10 DIAGNOSIS — R531 Weakness: Secondary | ICD-10-CM | POA: Diagnosis not present

## 2017-02-10 DIAGNOSIS — I5082 Biventricular heart failure: Secondary | ICD-10-CM | POA: Diagnosis not present

## 2017-02-10 DIAGNOSIS — R0602 Shortness of breath: Secondary | ICD-10-CM | POA: Diagnosis not present

## 2017-02-10 DIAGNOSIS — Z853 Personal history of malignant neoplasm of breast: Secondary | ICD-10-CM | POA: Diagnosis not present

## 2017-02-10 DIAGNOSIS — T451X5S Adverse effect of antineoplastic and immunosuppressive drugs, sequela: Secondary | ICD-10-CM | POA: Diagnosis not present

## 2017-02-10 DIAGNOSIS — G4733 Obstructive sleep apnea (adult) (pediatric): Secondary | ICD-10-CM | POA: Diagnosis not present

## 2017-02-10 DIAGNOSIS — E1143 Type 2 diabetes mellitus with diabetic autonomic (poly)neuropathy: Secondary | ICD-10-CM | POA: Diagnosis not present

## 2017-02-10 DIAGNOSIS — I110 Hypertensive heart disease with heart failure: Secondary | ICD-10-CM | POA: Diagnosis not present

## 2017-02-10 DIAGNOSIS — G2581 Restless legs syndrome: Secondary | ICD-10-CM | POA: Diagnosis not present

## 2017-02-10 DIAGNOSIS — K3184 Gastroparesis: Secondary | ICD-10-CM | POA: Diagnosis not present

## 2017-02-10 DIAGNOSIS — R079 Chest pain, unspecified: Secondary | ICD-10-CM | POA: Diagnosis not present

## 2017-02-10 LAB — BASIC METABOLIC PANEL
ANION GAP: 8 (ref 5–15)
BUN: 34 mg/dL — ABNORMAL HIGH (ref 6–20)
CALCIUM: 9.8 mg/dL (ref 8.9–10.3)
CO2: 26 mmol/L (ref 22–32)
CREATININE: 1.35 mg/dL — AB (ref 0.44–1.00)
Chloride: 93 mmol/L — ABNORMAL LOW (ref 101–111)
GFR, EST AFRICAN AMERICAN: 48 mL/min — AB (ref >60)
GFR, EST NON AFRICAN AMERICAN: 41 mL/min — AB (ref >60)
Glucose, Bld: 255 mg/dL — ABNORMAL HIGH (ref 65–99)
Potassium: 6 mmol/L — ABNORMAL HIGH (ref 3.5–5.1)
SODIUM: 127 mmol/L — AB (ref 135–145)

## 2017-02-10 NOTE — Telephone Encounter (Signed)
Patient called and having significant SOB that has increased over the last 2-3 days with increased orthopnea.  She is SOB over the phone.  She is a 61 year old female with NICM, a-fib w/ RVR, possible recurrent breast cancer.  She was discharged from the hospital 02/07/17 on milrinone 0.375 mcg/kg/min, metolazone 2.5mg  as needed for weight gain.  She states she took metolazone yesterday and had dysuria with this.  I have instructed patient to go to ER.  She believes she has gained 7 lbs since discharge.

## 2017-02-10 NOTE — Patient Outreach (Addendum)
[facility]) Care Management  02/10/2017  [name] [birth year] [id number]  Patient discharged from [facility] on 7/6, [place] Heart Failure , Atrial Fibrillation    1218 Transition of care call Placed call to patient at preferred number no answer able to leave a hipaa compliant telephone message requesting a return call.   Plan Will await return call if no response will attempt call on next day.  1245 Return call from patient   Subjective:  Patient reports she is a little winded from activity on this morning , discussed office visit for labs this morning and she is packing to return to her home from her sisters on today.   Patient reports she has been using the scales at her sisters for the last 3 days and weights have been 7/8 208.4 and today 207.4, weights not on usual scales. Patient reports she keeps a record of daily weights and blood sugars in her computer.   Patient discussed she has difficulty standing for long periods so she no longer takes a shower, does sponge bath. She does not use a cane or walker,states her home is small so she holds onto the wall.  Patient is looking forward to returning to her home on today and sister has done grocery shopping for easy to prepare low salt food items for patient per report.   Patient is not able to review medications at this time, she has her things packed up as she is moving back to her home on today, states she does all the medications the doctor ordered.  She requested that I call her at a later time when she is settled at home.  Patient has post hospital visit at Heart failure clinic and her daughter will provide transportation and she will need to arrange with her daughter prior to making appointment with PCP.   Patient agreeable to weekly  follow up with transition of care program.    Encounter Medications:  Outpatient Encounter Prescriptions as of 02/10/2017  Medication Sig  . acetaminophen (TYLENOL) 650 MG  CR tablet Take 1,300 mg by mouth every 8 (eight) hours as needed for pain.  Marland Kitchen albuterol (PROVENTIL HFA) 108 (90 BASE) MCG/ACT inhaler Inhale 2 puffs into the lungs every 6 (six) hours as needed. For shortness of breath  . ALPRAZolam (XANAX) 0.5 MG tablet Take 0.25 mg by mouth 4 (four) times daily as needed for anxiety.   Marland Kitchen amiodarone (PACERONE) 200 MG tablet Take 2 tablets (400 mg total) twice daily for one week. Then on 02/14/17, decrease to 1 tablet (200 mg total) twice daily.  Marland Kitchen apixaban (ELIQUIS) 5 MG TABS tablet Take 1 tablet (5 mg total) by mouth 2 (two) times daily.  . baclofen (LIORESAL) 20 MG tablet Take 20 mg by mouth 2 (two) times daily as needed for muscle spasms.  . busPIRone (BUSPAR) 30 MG tablet Take 30 mg by mouth 2 (two) times daily.   . Calcium Carb-Cholecalciferol (CALCIUM 600+D3) 600-800 MG-UNIT TABS Take by mouth daily.  . Cholecalciferol (VITAMIN D) 2000 units CAPS Take 1 capsule by mouth daily.  . digoxin (LANOXIN) 0.125 MG tablet Take 1 tablet (0.125 mg total) by mouth every other day.  . docusate sodium (COLACE) 100 MG capsule Take 3 capsules (300 mg total) by mouth at bedtime.  . DULoxetine (CYMBALTA) 30 MG capsule Take 30 mg by mouth daily.  . Garlic 824 MG CAPS Take 500 mg by mouth daily after lunch.   . Glucos-Chondroit-Hyaluron-MSM (GLUCOSAMINE CHONDROITIN  JOINT PO) Take 1 tablet by mouth daily.  Marland Kitchen HUMALOG KWIKPEN 100 UNIT/ML KiwkPen Inject 14-24 Units into the skin 3 (three) times daily. On average per patient 24 unit with breakfast, 16 units with lunch, & 14-16 units with supper  . HUMALOG MIX 75/25 KWIKPEN (75-25) 100 UNIT/ML Kwikpen Inject 60 Units into the skin 2 (two) times daily.   . hydrOXYzine (ATARAX/VISTARIL) 50 MG tablet Take 50 mg by mouth every 6 (six) hours as needed for itching ((primarily taken at bedtime)).   . IRON PO Take 24 mg by mouth daily.  Marland Kitchen levothyroxine (SYNTHROID, LEVOTHROID) 75 MCG tablet Take 75 mcg by mouth daily before breakfast.   .  Melatonin 1 MG TABS Take 0.5 mg by mouth at bedtime.  . metolazone (ZAROXOLYN) 2.5 MG tablet Take 1 tablet (2.5 mg total) by mouth as needed (for weight gain >3 lbs).  . milrinone (PRIMACOR) 20 MG/100 ML SOLN infusion Inject 35.2875 mcg/min into the vein continuous.  . mometasone (NASONEX) 50 MCG/ACT nasal spray Place 1 spray into both nostrils daily. allergies  . Multiple Minerals (CALCIUM-MAGNESIUM-ZINC) TABS Take 1 tablet by mouth daily after lunch.   . nitroGLYCERIN (NITROSTAT) 0.4 MG SL tablet Place 1 tablet (0.4 mg total) under the tongue every 5 (five) minutes as needed. For chest pain  . Omega-3 Fatty Acids (FISH OIL) 1000 MG CAPS Take 2 capsules by mouth daily.  . pantoprazole (PROTONIX) 40 MG tablet Take 40 mg by mouth 2 (two) times daily.    . polyethylene glycol powder (GLYCOLAX/MIRALAX) powder Mix 17 grams in 8 oz of water twice daily  . potassium chloride SA (K-DUR,KLOR-CON) 20 MEQ tablet Take 60 meq (3 tabs) q am and 80 meq (4 tabs) q pm.  . Propylene Glycol (SYSTANE BALANCE) 0.6 % SOLN Place 1 drop into both eyes 3 (three) times daily.  Marland Kitchen pyridOXINE (VITAMIN B-6) 100 MG tablet Take 100 mg by mouth daily after lunch.   Marland Kitchen rOPINIRole (REQUIP) 1 MG tablet Take 1 mg by mouth at bedtime and may repeat dose one time if needed. IF RESTLESS LEGS PERSIST PATIENT MAY REPEAT ADDITIONAL DOSE  . rosuvastatin (CRESTOR) 10 MG tablet Take 10 mg by mouth at bedtime.  . Simethicone Extra Strength 125 MG CAPS Take 1 tablet by mouth daily as needed (for bloating). Reported on 02/23/2016  . spironolactone (ALDACTONE) 25 MG tablet Take 1 tablet (25 mg total) by mouth 2 (two) times daily.  . SUMAtriptan (IMITREX) 100 MG tablet Take 100 mg by mouth daily as needed for migraine.   . tiotropium (SPIRIVA) 18 MCG inhalation capsule Place 18 mcg into inhaler and inhale daily.    . traMADol (ULTRAM) 50 MG tablet Take 1 tablet by mouth daily as needed for moderate pain.   . vitamin B-12 (CYANOCOBALAMIN) 1000 MCG  tablet Take 1,000 mcg by mouth daily after lunch.   . vitamin C (ASCORBIC ACID) 500 MG tablet Take 500 mg by mouth daily after lunch.   . vitamin E 400 UNIT capsule Take 400 Units by mouth daily after lunch.    No facility-administered encounter medications on file as of 02/10/2017.     Functional Status:  In your present state of health, do you have any difficulty performing the following activities: 01/28/2017 01/28/2017  Hearing? - Y  Vision? - N  Difficulty concentrating or making decisions? - N  Walking or climbing stairs? - N  Dressing or bathing? - N  Doing errands, shopping? Y -  Conservation officer, nature and  eating ? - -  Using the Toilet? - -  In the past six months, have you accidently leaked urine? - -  Do you have problems with loss of bowel control? - -  Managing your Medications? - -  Managing your Finances? - -  Housekeeping or managing your Housekeeping? - -  Some recent data might be hidden    Fall/Depression Screening: Fall Risk  02/10/2017 01/14/2017 01/09/2017  Falls in the past year? Yes No No  Number falls in past yr: 1 - -  Injury with Fall? No - -  Risk Factor Category  - - -  Risk for fall due to : Impaired balance/gait Impaired balance/gait;Impaired mobility -  Follow up Falls prevention discussed - -      Plan:  Patient will receive weekly outreach calls as part of transition of care, next call in a week, discussed with patient co worker Tomasa Rand will contact her on next week.  Plan return call to patient in next day for medication review to complete transition of care call assessment.  Will send MD barrier letter .  Joylene Draft, RN, [place] [facility] [phone]- Mobile [phone]- Toll Free Main Office

## 2017-02-10 NOTE — Telephone Encounter (Signed)
Spoke w/ pt and requested that she send a manual transmission b/c her home monitor has not updated in at least 7 days.   

## 2017-02-11 ENCOUNTER — Telehealth (HOSPITAL_COMMUNITY): Payer: Self-pay

## 2017-02-11 ENCOUNTER — Emergency Department (HOSPITAL_COMMUNITY): Payer: Medicare Other

## 2017-02-11 ENCOUNTER — Inpatient Hospital Stay (HOSPITAL_COMMUNITY)
Admission: EM | Admit: 2017-02-11 | Discharge: 2017-02-12 | DRG: 292 | Disposition: A | Discharge status: Home Health | Payer: Medicare Other | Attending: Internal Medicine | Admitting: Internal Medicine

## 2017-02-11 ENCOUNTER — Encounter (HOSPITAL_COMMUNITY): Payer: Self-pay | Admitting: Emergency Medicine

## 2017-02-11 DIAGNOSIS — Z923 Personal history of irradiation: Secondary | ICD-10-CM

## 2017-02-11 DIAGNOSIS — E785 Hyperlipidemia, unspecified: Secondary | ICD-10-CM | POA: Diagnosis present

## 2017-02-11 DIAGNOSIS — Z885 Allergy status to narcotic agent status: Secondary | ICD-10-CM

## 2017-02-11 DIAGNOSIS — Z794 Long term (current) use of insulin: Secondary | ICD-10-CM

## 2017-02-11 DIAGNOSIS — Z7901 Long term (current) use of anticoagulants: Secondary | ICD-10-CM

## 2017-02-11 DIAGNOSIS — Z888 Allergy status to other drugs, medicaments and biological substances status: Secondary | ICD-10-CM

## 2017-02-11 DIAGNOSIS — T451X5S Adverse effect of antineoplastic and immunosuppressive drugs, sequela: Secondary | ICD-10-CM | POA: Diagnosis not present

## 2017-02-11 DIAGNOSIS — Z79899 Other long term (current) drug therapy: Secondary | ICD-10-CM | POA: Diagnosis not present

## 2017-02-11 DIAGNOSIS — I509 Heart failure, unspecified: Secondary | ICD-10-CM | POA: Diagnosis not present

## 2017-02-11 DIAGNOSIS — Z886 Allergy status to analgesic agent status: Secondary | ICD-10-CM

## 2017-02-11 DIAGNOSIS — L732 Hidradenitis suppurativa: Secondary | ICD-10-CM | POA: Diagnosis present

## 2017-02-11 DIAGNOSIS — R079 Chest pain, unspecified: Secondary | ICD-10-CM | POA: Diagnosis not present

## 2017-02-11 DIAGNOSIS — Z95810 Presence of automatic (implantable) cardiac defibrillator: Secondary | ICD-10-CM

## 2017-02-11 DIAGNOSIS — Z8249 Family history of ischemic heart disease and other diseases of the circulatory system: Secondary | ICD-10-CM

## 2017-02-11 DIAGNOSIS — E1143 Type 2 diabetes mellitus with diabetic autonomic (poly)neuropathy: Secondary | ICD-10-CM | POA: Diagnosis not present

## 2017-02-11 DIAGNOSIS — E871 Hypo-osmolality and hyponatremia: Secondary | ICD-10-CM | POA: Diagnosis present

## 2017-02-11 DIAGNOSIS — I5082 Biventricular heart failure: Secondary | ICD-10-CM | POA: Diagnosis present

## 2017-02-11 DIAGNOSIS — G4733 Obstructive sleep apnea (adult) (pediatric): Secondary | ICD-10-CM | POA: Diagnosis present

## 2017-02-11 DIAGNOSIS — Z8049 Family history of malignant neoplasm of other genital organs: Secondary | ICD-10-CM

## 2017-02-11 DIAGNOSIS — I2510 Atherosclerotic heart disease of native coronary artery without angina pectoris: Secondary | ICD-10-CM | POA: Diagnosis present

## 2017-02-11 DIAGNOSIS — Z801 Family history of malignant neoplasm of trachea, bronchus and lung: Secondary | ICD-10-CM

## 2017-02-11 DIAGNOSIS — G2581 Restless legs syndrome: Secondary | ICD-10-CM | POA: Diagnosis present

## 2017-02-11 DIAGNOSIS — I427 Cardiomyopathy due to drug and external agent: Secondary | ICD-10-CM | POA: Diagnosis present

## 2017-02-11 DIAGNOSIS — I252 Old myocardial infarction: Secondary | ICD-10-CM

## 2017-02-11 DIAGNOSIS — N179 Acute kidney failure, unspecified: Secondary | ICD-10-CM | POA: Diagnosis not present

## 2017-02-11 DIAGNOSIS — Z803 Family history of malignant neoplasm of breast: Secondary | ICD-10-CM

## 2017-02-11 DIAGNOSIS — E875 Hyperkalemia: Secondary | ICD-10-CM | POA: Diagnosis not present

## 2017-02-11 DIAGNOSIS — Z800 Family history of malignant neoplasm of digestive organs: Secondary | ICD-10-CM

## 2017-02-11 DIAGNOSIS — K219 Gastro-esophageal reflux disease without esophagitis: Secondary | ICD-10-CM | POA: Diagnosis present

## 2017-02-11 DIAGNOSIS — R918 Other nonspecific abnormal finding of lung field: Secondary | ICD-10-CM | POA: Diagnosis present

## 2017-02-11 DIAGNOSIS — I5020 Unspecified systolic (congestive) heart failure: Secondary | ICD-10-CM

## 2017-02-11 DIAGNOSIS — I480 Paroxysmal atrial fibrillation: Secondary | ICD-10-CM | POA: Diagnosis not present

## 2017-02-11 DIAGNOSIS — Z6833 Body mass index (BMI) 33.0-33.9, adult: Secondary | ICD-10-CM

## 2017-02-11 DIAGNOSIS — K3184 Gastroparesis: Secondary | ICD-10-CM | POA: Diagnosis present

## 2017-02-11 DIAGNOSIS — I110 Hypertensive heart disease with heart failure: Principal | ICD-10-CM | POA: Diagnosis present

## 2017-02-11 DIAGNOSIS — Z833 Family history of diabetes mellitus: Secondary | ICD-10-CM

## 2017-02-11 DIAGNOSIS — Z853 Personal history of malignant neoplasm of breast: Secondary | ICD-10-CM

## 2017-02-11 DIAGNOSIS — E039 Hypothyroidism, unspecified: Secondary | ICD-10-CM | POA: Diagnosis present

## 2017-02-11 DIAGNOSIS — K589 Irritable bowel syndrome without diarrhea: Secondary | ICD-10-CM | POA: Diagnosis present

## 2017-02-11 DIAGNOSIS — L02422 Furuncle of left axilla: Secondary | ICD-10-CM | POA: Diagnosis present

## 2017-02-11 DIAGNOSIS — J449 Chronic obstructive pulmonary disease, unspecified: Secondary | ICD-10-CM | POA: Diagnosis present

## 2017-02-11 DIAGNOSIS — I5023 Acute on chronic systolic (congestive) heart failure: Secondary | ICD-10-CM | POA: Diagnosis present

## 2017-02-11 DIAGNOSIS — Z881 Allergy status to other antibiotic agents status: Secondary | ICD-10-CM

## 2017-02-11 DIAGNOSIS — E669 Obesity, unspecified: Secondary | ICD-10-CM | POA: Diagnosis present

## 2017-02-11 DIAGNOSIS — Z87891 Personal history of nicotine dependence: Secondary | ICD-10-CM | POA: Diagnosis not present

## 2017-02-11 DIAGNOSIS — Z91030 Bee allergy status: Secondary | ICD-10-CM

## 2017-02-11 LAB — CBC WITH DIFFERENTIAL/PLATELET
Basophils Absolute: 0.1 10*3/uL (ref 0.0–0.1)
Basophils Relative: 1 %
EOS ABS: 0 10*3/uL (ref 0.0–0.7)
EOS PCT: 0 %
HCT: 43.7 % (ref 36.0–46.0)
HEMOGLOBIN: 14.3 g/dL (ref 12.0–15.0)
LYMPHS ABS: 1 10*3/uL (ref 0.7–4.0)
Lymphocytes Relative: 13 %
MCH: 29.6 pg (ref 26.0–34.0)
MCHC: 32.7 g/dL (ref 30.0–36.0)
MCV: 90.5 fL (ref 78.0–100.0)
MONO ABS: 0.8 10*3/uL (ref 0.1–1.0)
MONOS PCT: 10 %
Neutro Abs: 6 10*3/uL (ref 1.7–7.7)
Neutrophils Relative %: 76 %
PLATELETS: 307 10*3/uL (ref 150–400)
RBC: 4.83 MIL/uL (ref 3.87–5.11)
RDW: 16.9 % — AB (ref 11.5–15.5)
WBC: 7.9 10*3/uL (ref 4.0–10.5)

## 2017-02-11 LAB — COOXEMETRY PANEL
Carboxyhemoglobin: 1.4 % (ref 0.5–1.5)
Methemoglobin: 1 % (ref 0.0–1.5)
O2 Saturation: 68 %
Total hemoglobin: 16.3 g/dL — ABNORMAL HIGH (ref 12.0–16.0)

## 2017-02-11 LAB — BASIC METABOLIC PANEL
ANION GAP: 10 (ref 5–15)
Anion gap: 9 (ref 5–15)
Anion gap: 10 (ref 5–15)
BUN: 36 mg/dL — ABNORMAL HIGH (ref 6–20)
BUN: 32 mg/dL — AB (ref 6–20)
BUN: 35 mg/dL — AB (ref 6–20)
CALCIUM: 9.7 mg/dL (ref 8.9–10.3)
CHLORIDE: 95 mmol/L — AB (ref 101–111)
CO2: 23 mmol/L (ref 22–32)
CO2: 25 mmol/L (ref 22–32)
CO2: 28 mmol/L (ref 22–32)
CREATININE: 1.33 mg/dL — AB (ref 0.44–1.00)
CREATININE: 1.49 mg/dL — AB (ref 0.44–1.00)
Calcium: 9.6 mg/dL (ref 8.9–10.3)
Calcium: 9.3 mg/dL (ref 8.9–10.3)
Chloride: 93 mmol/L — ABNORMAL LOW (ref 101–111)
Chloride: 94 mmol/L — ABNORMAL LOW (ref 101–111)
Creatinine, Ser: 1.37 mg/dL — ABNORMAL HIGH (ref 0.44–1.00)
GFR calc Af Amer: 47 mL/min — ABNORMAL LOW (ref >60)
GFR calc non Af Amer: 42 mL/min — ABNORMAL LOW (ref >60)
GFR, EST AFRICAN AMERICAN: 43 mL/min — AB (ref >60)
GFR, EST AFRICAN AMERICAN: 49 mL/min — AB (ref >60)
GFR, EST NON AFRICAN AMERICAN: 37 mL/min — AB (ref >60)
GFR, EST NON AFRICAN AMERICAN: 41 mL/min — AB (ref >60)
GLUCOSE: 205 mg/dL — AB (ref 65–99)
GLUCOSE: 136 mg/dL — AB (ref 65–99)
Glucose, Bld: 213 mg/dL — ABNORMAL HIGH (ref 65–99)
POTASSIUM: 3.2 mmol/L — AB (ref 3.5–5.1)
Potassium: 6.4 mmol/L (ref 3.5–5.1)
Potassium: 4 mmol/L (ref 3.5–5.1)
Sodium: 126 mmol/L — ABNORMAL LOW (ref 135–145)
Sodium: 129 mmol/L — ABNORMAL LOW (ref 135–145)
Sodium: 132 mmol/L — ABNORMAL LOW (ref 135–145)

## 2017-02-11 LAB — CBC
HCT: 45.3 % (ref 36.0–46.0)
HEMOGLOBIN: 14.8 g/dL (ref 12.0–15.0)
MCH: 29.8 pg (ref 26.0–34.0)
MCHC: 32.7 g/dL (ref 30.0–36.0)
MCV: 91.3 fL (ref 78.0–100.0)
PLATELETS: 337 10*3/uL (ref 150–400)
RBC: 4.96 MIL/uL (ref 3.87–5.11)
RDW: 16.9 % — ABNORMAL HIGH (ref 11.5–15.5)
WBC: 9.2 10*3/uL (ref 4.0–10.5)

## 2017-02-11 LAB — MRSA PCR SCREENING: MRSA by PCR: NEGATIVE

## 2017-02-11 LAB — GLUCOSE, CAPILLARY
GLUCOSE-CAPILLARY: 181 mg/dL — AB (ref 65–99)
GLUCOSE-CAPILLARY: 125 mg/dL — AB (ref 65–99)
GLUCOSE-CAPILLARY: 77 mg/dL (ref 65–99)

## 2017-02-11 LAB — I-STAT TROPONIN, ED: Troponin i, poc: 0 ng/mL (ref 0.00–0.08)

## 2017-02-11 LAB — I-STAT CG4 LACTIC ACID, ED: Lactic Acid, Venous: 1.65 mmol/L (ref 0.5–1.9)

## 2017-02-11 LAB — DIGOXIN LEVEL: DIGOXIN LVL: 0.4 ng/mL — AB (ref 0.8–2.0)

## 2017-02-11 LAB — POTASSIUM: Potassium: 6.2 mmol/L — ABNORMAL HIGH (ref 3.5–5.1)

## 2017-02-11 LAB — BRAIN NATRIURETIC PEPTIDE: B Natriuretic Peptide: 839.7 pg/mL — ABNORMAL HIGH (ref 0.0–100.0)

## 2017-02-11 MED ORDER — ALBUTEROL SULFATE (2.5 MG/3ML) 0.083% IN NEBU
5.0000 mg | INHALATION_SOLUTION | Freq: Once | RESPIRATORY_TRACT | Status: AC
  Administered 2017-02-11: 5 mg via RESPIRATORY_TRACT
  Filled 2017-02-11: qty 6

## 2017-02-11 MED ORDER — ORAL CARE MOUTH RINSE
15.0000 mL | Freq: Two times a day (BID) | OROMUCOSAL | Status: DC
  Administered 2017-02-11 – 2017-02-12 (×2): 15 mL via OROMUCOSAL

## 2017-02-11 MED ORDER — BUSPIRONE HCL 15 MG PO TABS
30.0000 mg | ORAL_TABLET | Freq: Two times a day (BID) | ORAL | Status: DC
Start: 2017-02-11 — End: 2017-02-13
  Administered 2017-02-11 – 2017-02-12 (×3): 30 mg via ORAL
  Filled 2017-02-11 (×3): qty 2

## 2017-02-11 MED ORDER — VITAMIN E 180 MG (400 UNIT) PO CAPS
400.0000 [IU] | ORAL_CAPSULE | Freq: Every day | ORAL | Status: DC
  Administered 2017-02-12: 400 [IU] via ORAL
  Filled 2017-02-11 (×2): qty 1

## 2017-02-11 MED ORDER — VITAMIN D 1000 UNITS PO TABS
2000.0000 [IU] | ORAL_TABLET | Freq: Every day | ORAL | Status: DC
  Administered 2017-02-11 – 2017-02-12 (×2): 2000 [IU] via ORAL
  Filled 2017-02-11 (×2): qty 2

## 2017-02-11 MED ORDER — INSULIN ASPART 100 UNIT/ML ~~LOC~~ SOLN: 0.0000 [IU] | Freq: Every day | SUBCUTANEOUS | Status: DC

## 2017-02-11 MED ORDER — SODIUM BICARBONATE 8.4 % IV SOLN
50.0000 meq | Freq: Once | INTRAVENOUS | Status: AC
  Administered 2017-02-11: 50 meq via INTRAVENOUS
  Filled 2017-02-11: qty 50

## 2017-02-11 MED ORDER — ROPINIROLE HCL 1 MG PO TABS
1.0000 mg | ORAL_TABLET | Freq: Every evening | ORAL | Status: DC | PRN
  Administered 2017-02-11 (×2): 1 mg via ORAL
  Filled 2017-02-11 (×4): qty 1

## 2017-02-11 MED ORDER — MELATONIN 3 MG PO TABS
1.5000 mg | ORAL_TABLET | Freq: Every day | ORAL | Status: DC
  Administered 2017-02-11: 1.5 mg via ORAL
  Filled 2017-02-11 (×2): qty 0.5

## 2017-02-11 MED ORDER — INSULIN ASPART 100 UNIT/ML ~~LOC~~ SOLN
14.0000 [IU] | Freq: Three times a day (TID) | SUBCUTANEOUS | Status: DC
  Administered 2017-02-11 – 2017-02-12 (×6): 14 [IU] via SUBCUTANEOUS

## 2017-02-11 MED ORDER — APIXABAN 5 MG PO TABS
5.0000 mg | ORAL_TABLET | Freq: Two times a day (BID) | ORAL | Status: DC
Start: 2017-02-11 — End: 2017-02-13
  Administered 2017-02-11 – 2017-02-12 (×3): 5 mg via ORAL
  Filled 2017-02-11 (×3): qty 1

## 2017-02-11 MED ORDER — SODIUM CHLORIDE 0.9 % IV SOLN: 250.0000 mL | INTRAVENOUS | Status: DC | PRN

## 2017-02-11 MED ORDER — ACETAMINOPHEN 325 MG PO TABS
650.0000 mg | ORAL_TABLET | ORAL | Status: DC | PRN
Start: 2017-02-11 — End: 2017-02-13
  Administered 2017-02-12: 650 mg via ORAL
  Filled 2017-02-11: qty 2

## 2017-02-11 MED ORDER — DOCUSATE SODIUM 100 MG PO CAPS
300.0000 mg | ORAL_CAPSULE | Freq: Every day | ORAL | Status: DC
  Administered 2017-02-11: 300 mg via ORAL
  Filled 2017-02-11: qty 3

## 2017-02-11 MED ORDER — VITAMIN C 500 MG PO TABS
500.0000 mg | ORAL_TABLET | Freq: Every day | ORAL | Status: DC
  Administered 2017-02-12: 500 mg via ORAL
  Filled 2017-02-11: qty 1

## 2017-02-11 MED ORDER — MILRINONE LACTATE IN DEXTROSE 20-5 MG/100ML-% IV SOLN: 0.3750 ug/kg/min | INTRAVENOUS | Status: DC

## 2017-02-11 MED ORDER — LEVOTHYROXINE SODIUM 75 MCG PO TABS
75.0000 ug | ORAL_TABLET | Freq: Every day | ORAL | Status: DC
  Administered 2017-02-11 – 2017-02-12 (×2): 75 ug via ORAL
  Filled 2017-02-11 (×2): qty 1

## 2017-02-11 MED ORDER — BACLOFEN 20 MG PO TABS
20.0000 mg | ORAL_TABLET | Freq: Two times a day (BID) | ORAL | Status: DC | PRN
  Filled 2017-02-11: qty 1

## 2017-02-11 MED ORDER — SODIUM CHLORIDE 0.9% FLUSH: 3.0000 mL | INTRAVENOUS | Status: DC | PRN

## 2017-02-11 MED ORDER — SODIUM POLYSTYRENE SULFONATE 15 GM/60ML PO SUSP
30.0000 g | Freq: Once | ORAL | Status: AC
  Administered 2017-02-11: 30 g via ORAL
  Filled 2017-02-11: qty 120

## 2017-02-11 MED ORDER — INSULIN ASPART 100 UNIT/ML ~~LOC~~ SOLN
0.0000 [IU] | Freq: Three times a day (TID) | SUBCUTANEOUS | Status: DC
  Administered 2017-02-11: 3 [IU] via SUBCUTANEOUS
  Administered 2017-02-11: 4 [IU] via SUBCUTANEOUS
  Administered 2017-02-12 (×2): 3 [IU] via SUBCUTANEOUS

## 2017-02-11 MED ORDER — FUROSEMIDE 10 MG/ML IJ SOLN
40.0000 mg | Freq: Once | INTRAMUSCULAR | Status: AC
  Administered 2017-02-11: 40 mg via INTRAVENOUS
  Filled 2017-02-11: qty 4

## 2017-02-11 MED ORDER — PANTOPRAZOLE SODIUM 40 MG PO TBEC
40.0000 mg | DELAYED_RELEASE_TABLET | Freq: Two times a day (BID) | ORAL | Status: DC
Start: 2017-02-11 — End: 2017-02-13
  Administered 2017-02-11 – 2017-02-12 (×3): 40 mg via ORAL
  Filled 2017-02-11 (×3): qty 1

## 2017-02-11 MED ORDER — AMIODARONE HCL 200 MG PO TABS
400.0000 mg | ORAL_TABLET | Freq: Two times a day (BID) | ORAL | Status: DC
  Administered 2017-02-11 – 2017-02-12 (×3): 400 mg via ORAL
  Filled 2017-02-11 (×3): qty 2

## 2017-02-11 MED ORDER — FLUTICASONE PROPIONATE 50 MCG/ACT NA SUSP
2.0000 | Freq: Every day | NASAL | Status: DC
  Administered 2017-02-11 – 2017-02-12 (×2): 2 via NASAL
  Filled 2017-02-11: qty 16

## 2017-02-11 MED ORDER — SODIUM CHLORIDE 0.9% FLUSH
3.0000 mL | Freq: Two times a day (BID) | INTRAVENOUS | Status: DC
  Administered 2017-02-11 – 2017-02-12 (×3): 3 mL via INTRAVENOUS

## 2017-02-11 MED ORDER — TIOTROPIUM BROMIDE MONOHYDRATE 18 MCG IN CAPS
18.0000 ug | ORAL_CAPSULE | Freq: Every day | RESPIRATORY_TRACT | Status: DC
  Administered 2017-02-11 – 2017-02-12 (×2): 18 ug via RESPIRATORY_TRACT
  Filled 2017-02-11: qty 5

## 2017-02-11 MED ORDER — INSULIN ASPART PROT & ASPART (70-30 MIX) 100 UNIT/ML ~~LOC~~ SUSP
50.0000 [IU] | Freq: Two times a day (BID) | SUBCUTANEOUS | Status: DC
  Administered 2017-02-11 – 2017-02-12 (×4): 50 [IU] via SUBCUTANEOUS
  Filled 2017-02-11: qty 10

## 2017-02-11 MED ORDER — ALTEPLASE 2 MG IJ SOLR
2.0000 mg | Freq: Once | INTRAMUSCULAR | Status: AC
  Administered 2017-02-11: 2 mg

## 2017-02-11 MED ORDER — FUROSEMIDE 10 MG/ML IJ SOLN
80.0000 mg | Freq: Two times a day (BID) | INTRAMUSCULAR | Status: DC
  Administered 2017-02-11 – 2017-02-12 (×3): 80 mg via INTRAVENOUS
  Filled 2017-02-11 (×3): qty 8

## 2017-02-11 MED ORDER — VITAMIN B-12 1000 MCG PO TABS
1000.0000 ug | ORAL_TABLET | Freq: Every day | ORAL | Status: DC
  Administered 2017-02-12: 1000 ug via ORAL
  Filled 2017-02-11: qty 1

## 2017-02-11 MED ORDER — POTASSIUM CHLORIDE CRYS ER 20 MEQ PO TBCR
40.0000 meq | EXTENDED_RELEASE_TABLET | Freq: Two times a day (BID) | ORAL | Status: DC
  Administered 2017-02-11 – 2017-02-12 (×2): 40 meq via ORAL
  Filled 2017-02-11 (×2): qty 2

## 2017-02-11 MED ORDER — ROSUVASTATIN CALCIUM 10 MG PO TABS
10.0000 mg | ORAL_TABLET | Freq: Every day | ORAL | Status: DC
  Administered 2017-02-11: 10 mg via ORAL
  Filled 2017-02-11: qty 1

## 2017-02-11 MED ORDER — DULOXETINE HCL 30 MG PO CPEP
30.0000 mg | ORAL_CAPSULE | Freq: Every day | ORAL | Status: DC
  Administered 2017-02-11 – 2017-02-12 (×2): 30 mg via ORAL
  Filled 2017-02-11 (×2): qty 1

## 2017-02-11 MED ORDER — HYDROXYZINE HCL 25 MG PO TABS: 50.0000 mg | ORAL_TABLET | Freq: Four times a day (QID) | ORAL | Status: DC | PRN

## 2017-02-11 MED ORDER — POLYETHYLENE GLYCOL 3350 17 G PO PACK
17.0000 g | PACK | Freq: Every day | ORAL | Status: DC | PRN
  Administered 2017-02-11: 17 g via ORAL
  Filled 2017-02-11: qty 1

## 2017-02-11 MED ORDER — MILRINONE LACTATE IN DEXTROSE 20-5 MG/100ML-% IV SOLN
0.3750 ug/kg/min | INTRAVENOUS | Status: DC
  Administered 2017-02-11 – 2017-02-12 (×2): 0.375 ug/kg/min via INTRAVENOUS
  Filled 2017-02-11 (×2): qty 200

## 2017-02-11 MED ORDER — SUMATRIPTAN SUCCINATE 100 MG PO TABS
100.0000 mg | ORAL_TABLET | Freq: Every day | ORAL | Status: DC | PRN
  Filled 2017-02-11: qty 1

## 2017-02-11 MED ORDER — VITAMIN B-6 100 MG PO TABS
100.0000 mg | ORAL_TABLET | Freq: Every day | ORAL | Status: DC
  Administered 2017-02-12: 100 mg via ORAL
  Filled 2017-02-11 (×2): qty 1

## 2017-02-11 MED ORDER — POTASSIUM CHLORIDE CRYS ER 20 MEQ PO TBCR
40.0000 meq | EXTENDED_RELEASE_TABLET | Freq: Once | ORAL | Status: AC
  Administered 2017-02-11: 40 meq via ORAL
  Filled 2017-02-11: qty 2

## 2017-02-11 MED ORDER — ONDANSETRON HCL 4 MG/2ML IJ SOLN: 4.0000 mg | Freq: Four times a day (QID) | INTRAMUSCULAR | Status: DC | PRN

## 2017-02-11 MED ORDER — ALPRAZOLAM 0.25 MG PO TABS: 0.2500 mg | ORAL_TABLET | Freq: Four times a day (QID) | ORAL | Status: DC | PRN

## 2017-02-11 NOTE — ED Triage Notes (Signed)
Pt arrives via [facility] for sob and weakness and intermittent CP, pt recently d/c from inpatient unit

## 2017-02-11 NOTE — H&P (Signed)
Cardiology Admission History and Physical:   Patient ID: [name]; [id number]; Jun 17, 1956   Admission date: 02/11/2017  Primary Care Provider: Lowella Dandy, NP Primary Cardiologist: Glori Bickers, MD  Chief Complaint:  Shortness of breath  Patient Profile:   [name] is a 61 y.o. female with a history of breast cancer, HTN, DM, HLD, GI bleed s/b AVMs, NICM who presents with dyspnea.  History of Present Illness:   [name] is a 61 year old female with a PMHx of breast cancer (possible recurrence with LUL mass) and chemo induced NICM, a-fib who was recently hospitalized 6/26-02/07/17 for cardiogenic shock.  She diuresed 20L.  She was discharged on milrinone 0.353mcg/kg/min.  Her lasix was stopped and she was put on metolazone 2.5mg  PRN for weight gain.  She was also put on amiodarone plan for 400mg  BID then  200mg  BID on 02/14/17.  She is on apixiban 5mg  BID.  She states that 7/9 around 3pm when trying to take a nap she noted that she was significantly more short of breath especially when laying flat.  She called me (cardiology on call) around 11pm and was dyspneic on the phone.  Her K earlier in the day was 6.0.  I told her to come to the ER.  In the ER, her K was 6.4 and Cr 1.49 (on discharge Cr was 1.16). She was given albuterol, sodium bicarb, 30g kayexylate, and 40mg  IV lasix.  Her CXR was consistent with pulmonary edema.   Past Medical History:  Diagnosis Date  . AICD (automatic cardioverter/defibrillator) present    st jude  . Anxiety   . 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 Asthma   . Axillary mass, left 10/17/2016  . Breast cancer ([place]) 2005, 2017  . CHF (congestive heart failure) ([place])   . COPD (chronic obstructive pulmonary disease) ([place])   . Depression    followed by a psychiatrist  . DYSLIPIDEMIA 05/01/2009  . Dyspnea   . Dysrhythmia    atrial fibrillation  . Family history of breast cancer   . Family history of colon cancer   . Family history of uterine cancer   .  Furuncle 10/17/2016  . Gastroparesis   . GERD (gastroesophageal reflux disease)   . Headache   . History of breast cancer 2006   with return in 2008, s/p mastectomy, XRT and chemotherapy  . History of external beam radiation therapy 09/25/16-11/13/16   left breast treated to 50.4 Gy in 28 fractions, boost 12 Gy in 6 fractions  . Hypertension   . 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 IBS (irritable bowel syndrome)   . ICD (implantable cardiac defibrillator) in place 11/10/2012   ICD (11/2012)  . Malignant neoplasm of lower-inner quadrant of left female breast ([place]) 06/26/2016  . Myocardial infarction ([employer])   . OBESITY 07/24/2007  . OBSTRUCTIVE SLEEP APNEA 07/24/2007   with CPAP compliance  . Osteoarthritis   . Persistent atrial fibrillation ([place]) 09/2014  . Personal history of radiation therapy    current  . Presence of permanent cardiac pacemaker   . RESTLESS LEG SYNDROME 07/24/2007  . SYSTOLIC HEART FAILURE, CHRONIC 01/05/2009   a. chemo induced cardiomyopathy b. cMRI (02/2009) EF 45% c. Myoview 05/2010: EF 59%, no ischemia d. RHC (01/2012) RA 20, RV 55/13/22, PA 56/34 (44), PCWP 28, Fick CO/CI: 3.1 / 1.5, PVR 5.3 WU, PA sat 42% & 48% e. ECHO (10/2012) EF 20-25%, diff HK, MV calcified annulus f. ECHO (03/2014) EF 30-35%, sev HK, inferior/inferoseptal walls, mild  MR    Past Surgical History:  Procedure Laterality Date  . ABDOMINAL HYSTERECTOMY  1991  . BI-VENTRICULAR IMPLANTABLE CARDIOVERTER DEFIBRILLATOR N/A 11/10/2012   SJM Forify Assura single chamber ICD  . BREAST BIOPSY Left 08/2015   benign  . BREAST BIOPSY Left 06/2016   malignant  . BREAST LUMPECTOMY Left 07/24/2016   malignant  . BREAST LUMPECTOMY WITH RADIOACTIVE SEED AND SENTINEL LYMPH NODE BIOPSY Left 07/24/2016   Procedure: LEFT BREAST LUMPECTOMY WITH RADIOACTIVE SEED AND SENTINEL LYMPH NODE BIOPSY WITH BLUE DYE INJECTION;  Surgeon: Fanny Skates, MD;  Location: [facility];  Service: General;  Laterality: Left;  . CARDIAC  CATHETERIZATION N/A 08/21/2015   Procedure: Right/Left Heart Cath and Coronary Angiography;  Surgeon: Jolaine Artist, MD;  Location: [place] [facility];  Service: Cardiovascular;  Laterality: N/A;  . CARDIAC DEFIBRILLATOR PLACEMENT  11/10/2012   SJM Fortify Assura VR implanted by Dr Rayann Heman for primary prevention  . CARDIOVERSION N/A 02/06/2017   Procedure: CARDIOVERSION;  Surgeon: Jolaine Artist, MD;  Location: [facility] ENDOSCOPY;  Service: Cardiovascular;  Laterality: N/A;  . CHOLECYSTECTOMY    . COLONOSCOPY N/A 11/17/2015   Procedure: COLONOSCOPY;  Surgeon: Jerene Bears, MD;  Location: [facility] ENDOSCOPY;  Service: Endoscopy;  Laterality: N/A;  . ENTEROSCOPY N/A 03/26/2016   Procedure: ENTEROSCOPY;  Surgeon: Manus Gunning, MD;  Location: [facility] ENDOSCOPY;  Service: Gastroenterology;  Laterality: N/A;  . ESOPHAGOGASTRODUODENOSCOPY N/A 11/16/2015   Procedure: ESOPHAGOGASTRODUODENOSCOPY (EGD);  Surgeon: Manus Gunning, MD;  Location: [place];  Service: Gastroenterology;  Laterality: N/A;  . ESOPHAGOGASTRODUODENOSCOPY N/A 02/24/2016   Procedure: ESOPHAGOGASTRODUODENOSCOPY (EGD);  Surgeon: Milus Banister, MD;  Location: [place];  Service: Endoscopy;  Laterality: N/A;  . ESOPHAGOGASTRODUODENOSCOPY N/A 03/26/2016   Procedure: ESOPHAGOGASTRODUODENOSCOPY (EGD);  Surgeon: Manus Gunning, MD;  Location: [facility];  Service: Gastroenterology;  Laterality: N/A;  . EYE SURGERY     cataracts  . GIVENS CAPSULE STUDY N/A 12/11/2015   Procedure: GIVENS CAPSULE STUDY;  Surgeon: Manus Gunning, MD;  Location: [place];  Service: Gastroenterology;  Laterality: N/A;  . IR FLUORO GUIDE CV LINE RIGHT  02/04/2017  . IR US GUIDE VASC ACCESS RIGHT  02/04/2017  . KNEE CARTILAGE SURGERY    . MASTECTOMY     right  . RADIOLOGY WITH ANESTHESIA N/A 02/02/2017   Procedure: CARDIOVERSION;  Surgeon: Radiologist, Medication, MD;  Location: [place];  Service: Radiology;  Laterality: N/A;  . RIGHT  HEART CATH N/A 02/07/2017   Procedure: Right Heart Cath;  Surgeon: Jolaine Artist, MD;  Location: [place] [facility];  Service: Cardiovascular;  Laterality: N/A;  . RIGHT/LEFT HEART CATH AND CORONARY ANGIOGRAPHY N/A 01/28/2017   Procedure: Right/Left Heart Cath and Coronary Angiography;  Surgeon: Jolaine Artist, MD;  Location: [place] [facility];  Service: Cardiovascular;  Laterality: N/A;  . WRIST SURGERY Bilateral      Medications Prior to Admission: Prior to Admission medications   Medication Sig Start Date End Date Taking? Authorizing Provider  acetaminophen (TYLENOL) 650 MG CR tablet Take 1,300 mg by mouth every 8 (eight) hours as needed for pain.   Yes [provider]  albuterol (PROVENTIL HFA) 108 (90 BASE) MCG/ACT inhaler Inhale 2 puffs into the lungs every 6 (six) hours as needed. For shortness of breath   Yes [provider]  ALPRAZolam (XANAX) 0.5 MG tablet Take 0.25 mg by mouth 4 (four) times daily as needed for anxiety.  11/06/15  Yes [provider]  amiodarone (PACERONE) 200 MG tablet Take 2 tablets (400 mg total) twice daily for one week. Then on 02/14/17, decrease to 1 tablet (200 mg total) twice daily. 02/07/17  Yes Shirley Friar, PA-C  apixaban (ELIQUIS) 5 MG TABS tablet Take 1 tablet (5 mg total) by mouth 2 (two) times daily. 07/11/16  Yes Bensimhon, Shaune Pascal, MD  baclofen (LIORESAL) 20 MG tablet Take 20 mg by mouth 2 (two) times daily as needed for muscle spasms.   Yes [provider]  busPIRone (BUSPAR) 30 MG tablet Take 30 mg by mouth 2 (two) times daily.    Yes [provider]  Calcium Carb-Cholecalciferol (CALCIUM 600+D3) 600-800 MG-UNIT TABS Take by mouth daily.   Yes [provider]  Cholecalciferol (VITAMIN D) 2000 units CAPS Take 1 capsule by mouth daily.   Yes [provider]  digoxin (LANOXIN) 0.125 MG tablet Take 1 tablet (0.125 mg total) by mouth every other day. 07/11/16  Yes Bensimhon,  Shaune Pascal, MD  docusate sodium (COLACE) 100 MG capsule Take 3 capsules (300 mg total) by mouth at bedtime. 10/03/15  Yes Clegg, Amy D, NP  DULoxetine (CYMBALTA) 30 MG capsule Take 30 mg by mouth daily.   Yes Moon, Amy A, NP  Garlic 458 MG CAPS Take 500 mg by mouth daily after lunch.    Yes [provider]  Glucos-Chondroit-Hyaluron-MSM (GLUCOSAMINE CHONDROITIN JOINT PO) Take 1 tablet by mouth daily.   Yes [provider]  HUMALOG KWIKPEN 100 UNIT/ML KiwkPen Inject 14-24 Units into the skin 3 (three) times daily. On average per patient 24 unit with breakfast, 16 units with lunch, & 14-16 units with supper 10/30/15  Yes [provider]  HUMALOG MIX 75/25 KWIKPEN (75-25) 100 UNIT/ML Kwikpen Inject 60 Units into the skin 2 (two) times daily.  11/21/15  Yes [provider]  hydrOXYzine (ATARAX/VISTARIL) 50 MG tablet Take 50 mg by mouth every 6 (six) hours as needed for itching ((primarily taken at bedtime)).    Yes [provider]  IRON PO Take 24 mg by mouth daily.   Yes [provider]  levothyroxine (SYNTHROID, LEVOTHROID) 75 MCG tablet Take 75 mcg by mouth daily before breakfast.    Yes [provider]  Melatonin 1 MG TABS Take 0.5 mg by mouth at bedtime.   Yes [provider]  metolazone (ZAROXOLYN) 2.5 MG tablet Take 1 tablet (2.5 mg total) by mouth as needed (for weight gain >3 lbs). 07/30/16  Yes Bensimhon, Shaune Pascal, MD  milrinone (PRIMACOR) 20 MG/100 ML SOLN infusion Inject 35.2875 mcg/min into the vein continuous. 02/07/17  Yes Shirley Friar, PA-C  mometasone (NASONEX) 50 MCG/ACT nasal spray Place 1 spray into both nostrils daily. allergies   Yes [provider]  Multiple Minerals (CALCIUM-MAGNESIUM-ZINC) TABS Take 1 tablet by mouth daily after lunch.    Yes [provider]  nitroGLYCERIN (NITROSTAT) 0.4 MG SL tablet Place 1 tablet (0.4 mg total) under the tongue every 5 (five) minutes as needed. For  chest pain 07/02/16  Yes Bensimhon, Shaune Pascal, MD  Omega-3 Fatty Acids (FISH OIL) 1000 MG CAPS Take 2 capsules by mouth daily.   Yes [provider]  pantoprazole (PROTONIX) 40 MG tablet Take 40 mg by mouth 2 (two) times daily.     Yes [provider]  polyethylene glycol powder (GLYCOLAX/MIRALAX) powder Mix 17 grams in 8 oz of water twice daily 06/04/16  Yes Armbruster, Renelda Loma, MD  potassium chloride SA (K-DUR,KLOR-CON) 20 MEQ  tablet Take 60 meq (3 tabs) q am and 80 meq (4 tabs) q pm. 02/07/17  Yes Tillery, Satira Mccallum, PA-C  Propylene Glycol (SYSTANE BALANCE) 0.6 % SOLN Place 1 drop into both eyes 3 (three) times daily.   Yes [provider]  pyridOXINE (VITAMIN B-6) 100 MG tablet Take 100 mg by mouth daily after lunch.    Yes [provider]  rOPINIRole (REQUIP) 1 MG tablet Take 1 mg by mouth at bedtime and may repeat dose one time if needed. IF RESTLESS LEGS PERSIST PATIENT MAY REPEAT ADDITIONAL DOSE   Yes [provider]  rosuvastatin (CRESTOR) 10 MG tablet Take 10 mg by mouth at bedtime. 06/18/16  Yes [provider]  Simethicone Extra Strength 125 MG CAPS Take 1 tablet by mouth daily as needed (for bloating). Reported on 02/23/2016   Yes [provider]  spironolactone (ALDACTONE) 25 MG tablet Take 1 tablet (25 mg total) by mouth 2 (two) times daily. 02/07/17  Yes Shirley Friar, PA-C  SUMAtriptan (IMITREX) 100 MG tablet Take 100 mg by mouth daily as needed for migraine.  11/28/15  Yes [provider]  tiotropium (SPIRIVA) 18 MCG inhalation capsule Place 18 mcg into inhaler and inhale daily.     Yes [provider]  traMADol (ULTRAM) 50 MG tablet Take 1 tablet by mouth daily as needed for moderate pain.  10/15/16  Yes [provider]  vitamin B-12 (CYANOCOBALAMIN) 1000 MCG tablet Take 1,000 mcg by mouth daily after lunch.    Yes [provider]  vitamin C (ASCORBIC ACID) 500 MG tablet  Take 500 mg by mouth daily after lunch.    Yes [provider]  vitamin E 400 UNIT capsule Take 400 Units by mouth daily after lunch.    Yes [provider]     Allergies:    Allergies  Allergen Reactions  . Bee Venom Anaphylaxis  . Aspirin Hives  . Ciprofloxacin Hives  . Codeine Nausea Only    "can take ONLY if she eats with med"  . Naproxen Hives  . Other Other (See Comments)    ANY TYPES OF METAL-CAUSES BLISTERS SKIN AND ITCHING   . Sulfonamide Derivatives Hives  . Xarelto [Rivaroxaban] Hives  . Atorvastatin Itching  . Nsaids Itching and Rash  . Tape Rash    Also allergic to metal  . Tyler Aas Flextouch [Insulin Degludec] Itching and Rash    Social History:   Social History   Social History  . Marital status: Divorced    Spouse name: N/A  . Number of children: 1  . Years of education: N/A   Occupational History  . Disabled Unemployed   Social History Main Topics  . Smoking status: Former Smoker    Packs/day: 1.50    Years: 33.00    Types: Cigarettes    Quit date: 04/12/2001  . Smokeless tobacco: Never Used     Comment: heaviest - 2.5- 3PPD x 3 years   . Alcohol use No  . Drug use: No  . Sexual activity: Not Currently   Other Topics Concern  . Not on file   Social History Narrative   Lives with mother and brother Philis Nettle          Family History:   The patient's family history includes Breast cancer (age of onset: 12) in her cousin; Breast cancer (age of onset: 13) in her maternal grandmother; Colon cancer in her maternal aunt; Colon cancer (age of onset: 12) in her  maternal aunt; Colon cancer (age of onset: 27) in her cousin; Congestive Heart Failure in her mother; Coronary artery disease in her father; Diabetes Mellitus II in her father and mother; Hypertension in her father; Lung cancer in her maternal aunt and mother; Uterine cancer in her cousin and cousin; Uterine cancer (age of onset: 62) in her cousin; Uterine cancer (age of  onset: 74) in her maternal aunt; Uterine cancer (age of onset: 36) in her maternal aunt; Uterine cancer (age of onset: 28) in her maternal aunt.    ROS:  Please see the history of present illness.  All other ROS reviewed and negative.     Physical Exam/Data:   Vitals:   02/11/17 0042  BP: 122/73  Pulse: 96  Resp: 20  Temp: (!) 97.4 F (36.3 C)  TempSrc: Oral  SpO2: 100%    General:  Mild distress, 2-3 word sentences due to SOB HEENT: normal Lymph: no adenopathy Neck: + JVD Endocrine:  No thryomegaly Vascular: No carotid bruits; FA pulses 2+ bilaterally without bruits  Cardiac:  normal S1, S2; RRR; no murmur  Lungs: diffuse rhonchi b/l Abd: soft, nontender, no hepatomegaly  Ext: 1+ pitting edema b/l Musculoskeletal:  No deformities, BUE and BLE strength normal and equal Skin: No rash or significant bruising Neuro:  CNs 2-12 intact, no focal abnormalities noted Psych:  Normal affect    EKG:  The ECG that was done was personally reviewed and demonstrates sinus rhythm w/ 1st degree AV block, RBBB  Relevant CV Studies: RHC/LHC 01/28/2017 Ao = 96/64 (76) LV = 96/37 RA = 20 RV = 54/21 PA = 59/30 (39) PCW = 33 Fick cardiac output/index = 3.1/1.6 PVR = 1.9 WU FA sat = 100% PA sat = 52%, 54% RA/PCWP = 0.61 PAPi = 1.45 Assessment: 1. Minimal non-obstructive CAD 2. Severe NICM with EF 10-15% 3. Elevated filling pressures with biventricular failure and severely reduced cardiac output  RHC 02/07/17 RA = 16 RV = 52/16 PA = 54/27 (40) PCW = 29  Fick cardiac output/index = 3.5/1.8 PVR = 3.1 WU FA sat = 93% PA sat = 50%, 53% PaPi = 1.7 RA/PCW = 0.55  Assessment: 1. Persistent severe biventricular failure with elevated filling pressures 2. Mild improvement with milrinone 3. RV indices are borderline for mechanical support   Laboratory Data:  Chemistry Recent Labs Lab 02/10/17 0927 02/11/17 0019  NA 127* 126*  K 6.0* 6.4*  CL 93* 93*  CO2 26 23    GLUCOSE 255* 213*  BUN 34* 36*  CREATININE 1.35* 1.49*  CALCIUM 9.8 9.7  GFRNONAA 41* 37*  GFRAA 48* 43*  ANIONGAP 8 10    No results for input(s): PROT, ALBUMIN, AST, ALT, ALKPHOS, BILITOT in the last 168 hours. Hematology Recent Labs Lab 02/05/17 0419 02/06/17 1119 02/11/17 0019  WBC 7.4  --  9.2  RBC 4.63  --  4.96  HGB 13.4 16.3* 14.8  HCT 42.1 48.0* 45.3  MCV 90.9  --  91.3  MCH 28.9  --  29.8  MCHC 31.8  --  32.7  RDW 16.8*  --  16.9*  PLT 216  --  337   Cardiac EnzymesNo results for input(s): TROPONINI in the last 168 hours.  Recent Labs Lab 02/11/17 0057  TROPIPOC 0.00    BNP Recent Labs Lab 02/11/17 0019  BNP 839.7*    DDimer No results for input(s): DDIMER in the last 168 hours.   Lactate 1.65  Radiology/Studies:  Dg Chest 2 View  Result Date: 02/11/2017 CLINICAL DATA:  Left-sided chest pain with shortness of breath EXAM: CHEST  2 VIEW COMPARISON:  01/31/2017 FINDINGS: Right-sided central venous catheter tip projects over the cavoatrial region. Left-sided single lead pacing device similar compared to prior. Surgical clips over the left breast. Mild cardiomegaly. Central vascular congestion and mild diffuse interstitial prominence suggesting mild edema. Possible patchy infiltrate inferiorly on the lateral view. No pleural effusion. Aortic atherosclerosis. No pneumothorax. IMPRESSION: 1. Cardiomegaly with mild central vascular congestion and diffuse interstitial prominence suggesting mild edema 2. Patchy opacity, likely left lung base may reflect a small infiltrate Electronically Signed   By: Donavan Foil M.D.   On: 02/11/2017 01:17    Assessment and Plan:   1. Acute HF Exacerbation: Continue 0.361mcg/kg/min milrinone.  Will give addition 40mg  IV lasix now for a total of 80mg  IV lasix tonight and will put on 80mg  IV lasix BID, this was same dose as when diuresing in hospital.  She additionally required metolazone, though will hold on this for now.  2g Na  diet, 2L free water restriction, strict I/Os, daily weights.   2. AKI/Hyperkalemia: Likely secondary to acute CHF exacerbation/volume overload.  Will diurese.  Given treatment for hyperkalemia in ER.  Will trend K with AM Labs and again in afternoon. 3. Paroxysmal A-fib: On 400mg  BID amiodarone to change to 200mg  BID on 11/15/16.  Currently in sinus rhythm.  On eliquis 5mg  BID, if GFR worsens will consider reducing dose or stopping.  Will hold digoxin for now given elevated Cr.  Will check dig level. 4. Breast CA: Will need outpatient f/u for lung mass which is suspicious for recurrence of breast ca. 5. DM: Have decreased her insulin slightly given decreased PO intake.  75/25 Humalog 50u BID, novolog 14 units with meals, SSI.  Severity of Illness: The appropriate patient status for this patient is INPATIENT. Inpatient status is judged to be reasonable and necessary in order to provide the required intensity of service to ensure the patient's safety. The patient's presenting symptoms, physical exam findings, and initial radiographic and laboratory data in the context of their chronic comorbidities is felt to place them at high risk for further clinical deterioration. Furthermore, it is not anticipated that the patient will be medically stable for discharge from the hospital within 2 midnights of admission. The following factors support the patient status of inpatient.   " The patient's presenting symptoms include dyspnea. " The worrisome physical exam findings include crackles, JVD. " The initial radiographic and laboratory data are worrisome because of pulmonary vascular congestion. " The chronic co-morbidities include severe cardiomyopathy, paroxysmal a-fib, breast cancer, DM   * I certify that at the point of admission it is my clinical judgment that the patient will require inpatient hospital care spanning beyond 2 midnights from the point of admission due to high intensity of service, high risk for  further deterioration and high frequency of surveillance required.*    Signed, Rosanna Randy, MD  02/11/2017 1:56 AM

## 2017-02-11 NOTE — Telephone Encounter (Signed)
Basic Metabolic Panel (BMET)  Order: [id number]  Status:  Final result Visible to patient:  No (Not Released) Dx:  Atrial fibrillation, unspecified type...  Notes recorded by Effie Berkshire, RN on 02/11/2017 at 9:20 AM EDT Left detailed message to HOLD potassium and spiro and call our office immediately to have lab redrawn. Stressed urgency on VM. Other phone numbers listed are currently cut off and do not have VM option. ------  Notes recorded by Jolaine Artist, MD on 02/11/2017 at 12:19 AM EDT ? Hemolyzed. Please call her ASAP. Stop all kcl and spiro. Repeat today.

## 2017-02-11 NOTE — Progress Notes (Signed)
[facility]  Ms. Stanfield is a new pt for [facility] who we took home on 02-07-17 on Milrinone. [facility] will continue to follow pt this readmission to support transition home when ordered.  If patient discharges after hours, please call [phone].   Larry Sierras 02/11/2017, 8:21 AM

## 2017-02-11 NOTE — Progress Notes (Signed)
Nutrition Brief Note  Patient identified on the Malnutrition Screening Tool (MST) Report  Wt Readings from Last 15 Encounters:  02/11/17 203 lb 8 oz (92.3 kg)  02/07/17 202 lb 3.2 oz (91.7 kg)  01/20/17 214 lb (97.1 kg)  01/09/17 214 lb 6.4 oz (97.3 kg)  12/21/16 215 lb (97.5 kg)  12/09/16 213 lb 12.8 oz (97 kg)  11/27/16 214 lb (97.1 kg)  11/05/16 217 lb (98.4 kg)  10/29/16 215 lb 9.6 oz (97.8 kg)  10/22/16 213 lb (96.6 kg)  10/17/16 222 lb (100.7 kg)  10/15/16 219 lb 12.8 oz (99.7 kg)  10/08/16 218 lb (98.9 kg)  10/01/16 218 lb 6.4 oz (99.1 kg)  09/27/16 219 lb (99.3 kg)   [name] is a 61 y.o. female with a history of breast cancer, HTN, DM, HLD, GI bleed s/b AVMs, NICM who presents with dyspnea.  Body mass index is 33.86 kg/m. Patient meets criteria for obesity, class I based on current BMI.   Current diet order is 2 grams sodium, patient is consuming approximately 100% of meals at this time. Labs and medications reviewed.   No nutrition interventions warranted at this time. If nutrition issues arise, please consult RD.   Melvin Whiteford A. Jimmye Norman, RD, LDN, CDE Pager: [phone] After hours Pager: [phone]

## 2017-02-11 NOTE — Progress Notes (Addendum)
Advanced Heart Failure Rounding Note  Primary HF: Dr. Haroldine Laws   Subjective:    Admitted from ED 02/10/17 with Hyperkalemia and dyspnea.   Feeling much better. Was not taking any diuretics at home.  Tried metolazone Sunday with no relief. Less orthopneic.    Out 1.3 L with IV lasix. K down to 4.0 with kayexalate yesterday.   No coox. No CVP with no blood return from PICC. Awaiting IV team to address.   Objective:   Weight Range: 203 lb 8 oz (92.3 kg) Body mass index is 33.86 kg/m.   Vital Signs:   Temp:  [97.4 F (36.3 C)] 97.4 F (36.3 C) (07/10 0042) Pulse Rate:  [67-108] 103 (07/10 0530) Resp:  [17-29] 21 (07/10 0530) BP: (106-126)/(62-83) 109/64 (07/10 0530) SpO2:  [85 %-100 %] 96 % (07/10 0530) Weight:  [203 lb 8 oz (92.3 kg)] 203 lb 8 oz (92.3 kg) (07/10 0600)    Weight change: Filed Weights   02/11/17 0600  Weight: 203 lb 8 oz (92.3 kg)    Intake/Output:   Intake/Output Summary (Last 24 hours) at 02/11/17 0758 Last data filed at 02/11/17 0547  Gross per 24 hour  Intake                0 ml  Output             13 00 ml  Net            -1300 ml      Physical Exam    General:  Fatigued appearing. NAD at rest.  HEENT: Normal Neck: Supple. JVP elevated. Carotids 2+ bilat; no bruits. No lymphadenopathy or thyromegaly appreciated. Cor: PMI nondisplaced. Irregular rate & rhythm. No rubs, gallops or murmurs. Lungs: Clear Abdomen: Soft, nontender, nondistended. No hepatosplenomegaly. No bruits or masses. Good bowel sounds. Extremities: No cyanosis, clubbing, rash, edema Neuro: Alert & orientedx3, cranial nerves grossly intact. moves all 4 extremities w/o difficulty. Affect pleasant   Telemetry   Personally reviewed, Afib 100s  EKG    Personally reviewed, ? Afib 95 bpm. 1st degree AVB.  Computer read as Sinus rhythm, 1st degree AVB.   No p waves.   Labs    CBC  Recent Labs  02/11/17 0019 02/11/17 0545  WBC 9.2 7.9  NEUTROABS  --  6.0  HGB  14.8 14.3  HCT 45.3 43.7  MCV 91.3 90.5  PLT 337 037   Basic Metabolic Panel  Recent Labs  02/11/17 0019 02/11/17 0156 02/11/17 0545  NA 126*  --  129*  K 6.4* 6.2* 4.0  CL 93*  --  95*  CO2 23  --  25  GLUCOSE 213*  --  205*  BUN 36*  --  35*  CREATININE 1.49*  --  1.33*  CALCIUM 9.7  --  9.6   Liver Function Tests No results for input(s): AST, ALT, ALKPHOS, BILITOT, PROT, ALBUMIN in the last 72 hours. No results for input(s): LIPASE, AMYLASE in the last 72 hours. Cardiac Enzymes No results for input(s): CKTOTAL, CKMB, CKMBINDEX, TROPONINI in the last 72 hours.  BNP: BNP (last 3 results)  Recent Labs  07/23/16 1200 12/21/16 1054 02/11/17 0019  BNP 182.4* 474.9* 839.7*    ProBNP (last 3 results) No results for input(s): PROBNP in the last 8760 hours.   D-Dimer No results for input(s): DDIMER in the last 72 hours. Hemoglobin A1C No results for input(s): HGBA1C in the last 72 hours. Fasting Lipid Panel No results for  input(s): CHOL, HDL, LDLCALC, TRIG, CHOLHDL, LDLDIRECT in the last 72 hours. Thyroid Function Tests No results for input(s): TSH, T4TOTAL, T3FREE, THYROIDAB in the last 72 hours.  Invalid input(s): FREET3  Other results:   Imaging    Dg Chest 2 View  Result Date: 02/11/2017 CLINICAL DATA:  Left-sided chest pain with shortness of breath EXAM: CHEST  2 VIEW COMPARISON:  01/31/2017 FINDINGS: Right-sided central venous catheter tip projects over the cavoatrial region. Left-sided single lead pacing device similar compared to prior. Surgical clips over the left breast. Mild cardiomegaly. Central vascular congestion and mild diffuse interstitial prominence suggesting mild edema. Possible patchy infiltrate inferiorly on the lateral view. No pleural effusion. Aortic atherosclerosis. No pneumothorax. IMPRESSION: 1. Cardiomegaly with mild central vascular congestion and diffuse interstitial prominence suggesting mild edema 2. Patchy opacity, likely left  lung base may reflect a small infiltrate Electronically Signed   By: Donavan Foil M.D.   On: 02/11/2017 01:17      Medications:     Scheduled Medications: . amiodarone  400 mg Oral BID  . apixaban  5 mg Oral BID  . busPIRone  30 mg Oral BID  . cholecalciferol  2,000 Units Oral Daily  . docusate sodium  300 mg Oral QHS  . DULoxetine  30 mg Oral Daily  . fluticasone  2 spray Each Nare Daily  . furosemide  80 mg Intravenous BID  . insulin aspart  0-20 Units Subcutaneous TID WC  . insulin aspart  0-20 Units Subcutaneous QHS  . insulin aspart  14 Units Subcutaneous TID WC  . insulin aspart protamine- aspart  50 Units Subcutaneous BID WC  . levothyroxine  75 mcg Oral QAC breakfast  . Melatonin  1.5 mg Oral QHS  . pantoprazole  40 mg Oral BID  . pyridOXINE  100 mg Oral QPC lunch  . rOPINIRole  1 mg Oral QHS,MR X 1  . rosuvastatin  10 mg Oral QHS  . sodium chloride flush  3 mL Intravenous Q12H  . tiotropium  18 mcg Inhalation Daily  . vitamin B-12  1,000 mcg Oral QPC lunch  . vitamin C  500 mg Oral QPC lunch  . vitamin E  400 Units Oral QPC lunch     Infusions: . sodium chloride    . milrinone 0.375 mcg/kg/min (02/11/17 0653)     PRN Medications:  sodium chloride, acetaminophen, ALPRAZolam, baclofen, hydrOXYzine, ondansetron (ZOFRAN) IV, sodium chloride flush, SUMAtriptan   RHC 02/07/2017 RA = 16 RV = 52/16 PA = 54/27 (40) PCW = 29  Fick cardiac output/index = 3.5/1.8 PVR = 3.1 WU FA sat = 93% PA sat = 50%, 53% PaPi = 1.7 RA/PCW = 0.55  Patient Profile   [name] is a 61 year old with a history of R 2005/2008 and L 2017 breast cancer, HTN, DM, IBS, hyperlipidemia, chronic systolic heart failure, NICM chemo induced cardiomyopathy, [employer] ICD, and GI bleed S/B AVMs. Admitted 02/10/17 with Hyperkalemia and DOE.   Assessment/Plan   1. Acute on chronic systolic HF due to NICM  - Echo 12/09/2016 LVEF 15%, mild MR, PA peak pressure 40 mm - RHC 02/07/17 with persistent  biventricular failure and elevated filling pressures. Marginal cardiac output.  - Discharged weight 202 lbs but RA that day 16.  - Continue to diurese with IV lasix 80 mg BID - Recheck BMET at 2 pm. Will likely need K supp prior to discharge as main issue was lack of diuretic.  - Will address and confirm  diuretic dose prior to discharge.  2. Hyperkalemia - Much improved this am with IV lasix and dose of kayexalate in ED.  - Recheck BMET this afternoon. Now that she is diuresing, will likely become hypokalemic.  3. Afib with RVR - Rate relatively controlled. - Failed DCCV x 2 separate attempts last admission. - Continue amiodarone 400 mg BID for now. Will decrease to 200 mg BID on discharge.  4. H/o GI bleed - Multiple scopes with most recent 03/2016. Has h/o polyps and AVms - No bleeding.  5. DM2 - Continue SSI. 6. OSA - CPAP qhs.  7. H/o NSVT - Continue amiodarone.  - Watch electrolytes closely with kayexalate.   Will need to watch at least one more day with kayexalate vs BMET prior to discharge and keep follow up for 02/13/17. Will discuss further with MD.   Length of Stay: 0  Joanne Friar, PA-C  02/11/2017, 7:58 AM  Advanced Heart Failure Team Pager [phone] (M-F; 7a - 4p)  Please contact [facility] for night-coverage after hours (4p -7a ) and weekends on [email or web address]  Patient seen and examined with the above-signed Advanced Practice Provider and/or Housestaff. I personally reviewed laboratory data, imaging studies and relevant notes. I independently examined the patient and formulated the important aspects of the plan. I have edited the note to reflect any of my changes or salient points. I have personally discussed the plan with the patient and/or family.  She is feeling much better this evening with re-initiation of diuretics. Volume status remains elevated. Continue diuresis. K improved.  PICC now functioning well. Co-ox 68% on milrinone. Rhythm seems to be back in  NSR. Will continue amio and Eliquis.   Glori Bickers, MD  7:30 PM

## 2017-02-11 NOTE — Progress Notes (Signed)
  Afternoon potassium recheck 3.2.    Will give 40 meq BID today and follow in am.    Beryle Beams" [place], PA-C 02/11/2017 4:16 PM

## 2017-02-11 NOTE — ED Notes (Addendum)
Pt SpO2 <88% while pt sleepy and not on CPAP. Placed pt on 3Lpm via [place].

## 2017-02-11 NOTE — ED Notes (Signed)
Date and time results received: 02/11/17  0139 (use smartphrase ".now" to insert current time)  Test: K+ Critical Value: 6.4   Name of Provider Notified: Randal Buba, MD  Orders Received? Or Actions Taken?:

## 2017-02-11 NOTE — ED Provider Notes (Signed)
[facility] DEPT Provider Note   CSN: [id number] Arrival date & time: 02/11/17  0014  By signing my name below, I, [name], attest that this documentation has been prepared under the direction and in the presence of Joanne, Wess Baney, [name]. Electronically Signed: Lise [name], ED Scribe. 02/11/17. 12:54 AM.  History   Chief Complaint Chief Complaint  Patient presents with  . Shortness of Breath  . Weakness   The history is provided by the patient. No language interpreter was used.  Shortness of Breath  This is a recurrent problem. The problem occurs continuously.The current episode started 6 to 12 hours ago. The problem has not changed since onset.Associated symptoms include PND and orthopnea. Pertinent negatives include no chest pain. The problem's precipitants include medical treatment. She has tried nothing for the symptoms. The treatment provided no relief. She has had prior hospitalizations. She has had prior ED visits. Associated medical issues include heart failure.    HPI Comments: [name] is a 61 y.o. female with a PMHx of AICD, asthma, breast cancer, CHF, COPD, dysrhythmia, GERD, HTN, MI, and obesity, brought in by ambulance, who presents to the Emergency Department complaining of sudden onset, intermittent shortness of breath that began at 3 pm this evening. Pt notes associated weakness. She reports today while trying to take a nap she began to experience difficulty breathing. Pt reports she was advised to come to the ED by Dr. Britta Mccreedy. She notes she was recently discharged from [facility] on 7/6, for CHF and atrial fibrillation.   Past Medical History:  Diagnosis Date  . AICD (automatic cardioverter/defibrillator) present    st jude  . Anxiety   . 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 Asthma   . Axillary mass, left 10/17/2016  . Breast cancer ([place]) 2005, 2017  . CHF (congestive heart failure) ([place])   . COPD (chronic obstructive pulmonary disease) ([place])   . Depression    followed by a  psychiatrist  . DYSLIPIDEMIA 05/01/2009  . Dyspnea   . Dysrhythmia    atrial fibrillation  . Family history of breast cancer   . Family history of colon cancer   . Family history of uterine cancer   . Furuncle 10/17/2016  . Gastroparesis   . GERD (gastroesophageal reflux disease)   . Headache   . History of breast cancer 2006   with return in 2008, s/p mastectomy, XRT and chemotherapy  . History of external beam radiation therapy 09/25/16-11/13/16   left breast treated to 50.4 Gy in 28 fractions, boost 12 Gy in 6 fractions  . Hypertension   . 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 IBS (irritable bowel syndrome)   . ICD (implantable cardiac defibrillator) in place 11/10/2012   ICD (11/2012)  . Malignant neoplasm of lower-inner quadrant of left female breast ([place]) 06/26/2016  . Myocardial infarction ([place])   . OBESITY 07/24/2007  . OBSTRUCTIVE SLEEP APNEA 07/24/2007   with CPAP compliance  . Osteoarthritis   . Persistent atrial fibrillation ([place]) 09/2014  . Personal history of radiation therapy    current  . Presence of permanent cardiac pacemaker   . RESTLESS LEG SYNDROME 07/24/2007  . SYSTOLIC HEART FAILURE, CHRONIC 01/05/2009   a. chemo induced cardiomyopathy b. cMRI (02/2009) EF 45% c. Myoview 05/2010: EF 59%, no ischemia d. RHC (01/2012) RA 20, RV 55/13/22, PA 56/34 (44), PCWP 28, Fick CO/CI: 3.1 / 1.5, PVR 5.3 WU, PA sat 42% & 48% e. ECHO (10/2012) EF 20-25%, diff HK, MV calcified annulus f. ECHO (03/2014) EF 30-35%, sev  HK, inferior/inferoseptal walls, mild MR    Patient Active Problem List   Diagnosis Date Noted  . Hypokalemia 02/05/2017  . Atrial flutter ([place]) 02/05/2017  . Goals of care, counseling/discussion   . DNR (do not resuscitate) discussion   . Palliative care encounter   . Acute on chronic systolic (congestive) heart failure ([place]) 01/28/2017  . Acute on chronic systolic heart failure, NYHA class 3 ([place]) 01/28/2017  . Axillary mass, left 10/17/2016  . Boil 10/17/2016    . Genetic testing 08/15/2016  . Family history of breast cancer   . Family history of uterine cancer   . Family history of colon cancer   . Malignant neoplasm of lower-inner quadrant of left female breast ([place]) 06/26/2016  . Small bowel polyp   . CHF (congestive heart failure) ([place]) 12/21/2015  . Acute blood loss anemia   . Atrial fibrillation ([place]) 12/11/2015  . Benign neoplasm of descending colon   . Upper GI bleeding   . Iron deficiency anemia due to chronic blood loss   . Acute upper GI bleeding 11/14/2015  . Anemia 11/14/2015  . Absolute anemia   . Symptomatic anemia   . Bleeding gastrointestinal   . PAF (paroxysmal atrial fibrillation) ([place]) 09/22/2015  . Chest pain at rest 08/19/2015  . Chest pain 08/19/2015  . NSVT (nonsustained ventricular tachycardia) ([place]) 06/30/2015  . Breast cancer of lower-outer quadrant of right female breast ([place]) 07/22/2014  . CAD (coronary artery disease) 02/17/2014  . 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 Cardiogenic shock ([place]) 01/31/2012  . PALPITATIONS 09/20/2010  . Mixed hypercholesterolemia and hypertriglyceridemia 05/01/2009  . HYPERTENSION, BENIGN 01/05/2009  . FOOT PAIN 11/29/2008  . Sinus tachycardia 10/25/2008  . 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 Obesity 07/24/2007  . Obstructive sleep apnea 07/24/2007  . RESTLESS LEG SYNDROME 07/24/2007    Past Surgical History:  Procedure Laterality Date  . ABDOMINAL HYSTERECTOMY  1991  . BI-VENTRICULAR IMPLANTABLE CARDIOVERTER DEFIBRILLATOR N/A 11/10/2012   SJM Forify Assura single chamber ICD  . BREAST BIOPSY Left 08/2015   benign  . BREAST BIOPSY Left 06/2016   malignant  . BREAST LUMPECTOMY Left 07/24/2016   malignant  . BREAST LUMPECTOMY WITH RADIOACTIVE SEED AND SENTINEL LYMPH NODE BIOPSY Left 07/24/2016   Procedure: LEFT BREAST LUMPECTOMY WITH RADIOACTIVE SEED AND SENTINEL LYMPH NODE BIOPSY WITH BLUE DYE INJECTION;   Surgeon: Fanny Skates, [name];  Location: [facility];  Service: General;  Laterality: Left;  . CARDIAC CATHETERIZATION N/A 08/21/2015   Procedure: Right/Left Heart Cath and Coronary Angiography;  Surgeon: Jolaine Artist, [name];  Location: [facility];  Service: Cardiovascular;  Laterality: N/A;  . CARDIAC DEFIBRILLATOR PLACEMENT  11/10/2012   SJM Fortify Assura VR implanted by Dr Rayann Heman for primary prevention  . CARDIOVERSION N/A 02/06/2017   Procedure: CARDIOVERSION;  Surgeon: Jolaine Artist, [name];  Location[facility] ENDOSCOPY;  Service: Cardiovascular;  Laterality: N/A;  . CHOLECYSTECTOMY    . COLONOSCOPY N/A 11/17/2015   Procedure: COLONOSCOPY;  Surgeon: Jerene Bears, [name];  Location: [facility] ENDOSCOPY;  Service: Endoscopy;  Laterality: N/A;  . ENTEROSCOPY N/A 03/26/2016   Procedure: ENTEROSCOPY;  Surgeon: Manus Gunning, [name];  Location: [facility] ENDOSCOPY;  Service: Gastroenterology;  Laterality: N/A;  . ESOPHAGOGASTRODUODENOSCOPY N/A 11/16/2015   Procedure: ESOPHAGOGASTRODUODENOSCOPY (EGD);  Surgeon: Manus Gunning, [name];  Location: [facility];  Service: Gastroenterology;  Laterality: N/A;  . ESOPHAGOGASTRODUODENOSCOPY N/A 02/24/2016   Procedure: ESOPHAGOGASTRODUODENOSCOPY (EGD);  Surgeon: Milus Banister,  [name];  Location: [facility] ENDOSCOPY;  Service: Endoscopy;  Laterality: N/A;  . ESOPHAGOGASTRODUODENOSCOPY N/A 03/26/2016   Procedure: ESOPHAGOGASTRODUODENOSCOPY (EGD);  Surgeon: Manus Gunning, [name];  Location: [facility];  Service: Gastroenterology;  Laterality: N/A;  . EYE SURGERY     cataracts  . GIVENS CAPSULE STUDY N/A 12/11/2015   Procedure: GIVENS CAPSULE STUDY;  Surgeon: Manus Gunning, [name];  Location: [facility];  Service: Gastroenterology;  Laterality: N/A;  . IR FLUORO GUIDE CV LINE RIGHT  02/04/2017  . IR US GUIDE VASC ACCESS RIGHT  02/04/2017  . KNEE CARTILAGE SURGERY    . MASTECTOMY     right  . RADIOLOGY WITH ANESTHESIA N/A 02/02/2017   Procedure: CARDIOVERSION;  Surgeon:  Radiologist, Medication, [name];  Location: [facility];  Service: Radiology;  Laterality: N/A;  . RIGHT HEART CATH N/A 02/07/2017   Procedure: Right Heart Cath;  Surgeon: Jolaine Artist, [name];  Location: [facility] LAB;  Service: Cardiovascular;  Laterality: N/A;  . RIGHT/LEFT HEART CATH AND CORONARY ANGIOGRAPHY N/A 01/28/2017   Procedure: Right/Left Heart Cath and Coronary Angiography;  Surgeon: Jolaine Artist, [name];  Location: [facility];  Service: Cardiovascular;  Laterality: N/A;  . WRIST SURGERY Bilateral    OB History    No data available     Home Medications    Prior to Admission medications   Medication Sig Start Date End Date Taking? Authorizing Provider  acetaminophen (TYLENOL) 650 MG CR tablet Take 1,300 mg by mouth every 8 (eight) hours as needed for pain.    Provider, Historical, [name]  albuterol (PROVENTIL HFA) 108 (90 BASE) MCG/ACT inhaler Inhale 2 puffs into the lungs every 6 (six) hours as needed. For shortness of breath    Provider, Historical, [name]  ALPRAZolam (XANAX) 0.5 MG tablet Take 0.25 mg by mouth 4 (four) times daily as needed for anxiety.  11/06/15   Provider, Historical, [name]  amiodarone (PACERONE) 200 MG tablet Take 2 tablets (400 mg total) twice daily for one week. Then on 02/14/17, decrease to 1 tablet (200 mg total) twice daily. 02/07/17   Shirley Friar, PA-C  apixaban (ELIQUIS) 5 MG TABS tablet Take 1 tablet (5 mg total) by mouth 2 (two) times daily. 07/11/16   Bensimhon, Shaune Pascal, [name]  baclofen (LIORESAL) 20 MG tablet Take 20 mg by mouth 2 (two) times daily as needed for muscle spasms.    Provider, Historical, [name]  busPIRone (BUSPAR) 30 MG tablet Take 30 mg by mouth 2 (two) times daily.     Provider, Historical, [name]  Calcium Carb-Cholecalciferol (CALCIUM 600+D3) 600-800 MG-UNIT TABS Take by mouth daily.    Provider, Historical, [name]  Cholecalciferol (VITAMIN D) 2000 units CAPS Take 1 capsule by mouth daily.    Provider, Historical, [name]  digoxin (LANOXIN) 0.125  MG tablet Take 1 tablet (0.125 mg total) by mouth every other day. 07/11/16   Bensimhon, Shaune Pascal, [name]  docusate sodium (COLACE) 100 MG capsule Take 3 capsules (300 mg total) by mouth at bedtime. 10/03/15   Clegg, Amy D, NP  DULoxetine (CYMBALTA) 30 MG capsule Take 30 mg by mouth daily.    Moon, Amy A, NP  Garlic 270 MG CAPS Take 500 mg by mouth daily after lunch.     Provider, Historical, [name]  Glucos-Chondroit-Hyaluron-MSM (GLUCOSAMINE CHONDROITIN JOINT PO) Take 1 tablet by mouth daily.    Provider, Historical, [name]  HUMALOG KWIKPEN 100 UNIT/ML KiwkPen Inject 14-24 Units into the skin 3 (three) times daily. On average per  patient 24 unit with breakfast, 16 units with lunch, & 14-16 units with supper 10/30/15   Provider, Historical, [name]  HUMALOG MIX 75/25 KWIKPEN (75-25) 100 UNIT/ML Kwikpen Inject 60 Units into the skin 2 (two) times daily.  11/21/15   Provider, Historical, [name]  hydrOXYzine (ATARAX/VISTARIL) 50 MG tablet Take 50 mg by mouth every 6 (six) hours as needed for itching ((primarily taken at bedtime)).     Provider, Historical, [name]  IRON PO Take 24 mg by mouth daily.    Provider, Historical, [name]  levothyroxine (SYNTHROID, LEVOTHROID) 75 MCG tablet Take 75 mcg by mouth daily before breakfast.     Provider, Historical, [name]  Melatonin 1 MG TABS Take 0.5 mg by mouth at bedtime.    Provider, Historical, [name]  metolazone (ZAROXOLYN) 2.5 MG tablet Take 1 tablet (2.5 mg total) by mouth as needed (for weight gain >3 lbs). 07/30/16   Bensimhon, Shaune Pascal, [name]  milrinone (PRIMACOR) 20 MG/100 ML SOLN infusion Inject 35.2875 mcg/min into the vein continuous. 02/07/17   Shirley Friar, PA-C  mometasone (NASONEX) 50 MCG/ACT nasal spray Place 1 spray into both nostrils daily. allergies    Provider, Historical, [name]  Multiple Minerals (CALCIUM-MAGNESIUM-ZINC) TABS Take 1 tablet by mouth daily after lunch.     Provider, Historical, [name]  nitroGLYCERIN (NITROSTAT) 0.4 MG SL tablet Place 1 tablet (0.4 mg total) under the  tongue every 5 (five) minutes as needed. For chest pain 07/02/16   Bensimhon, Shaune Pascal, [name]  Omega-3 Fatty Acids (FISH OIL) 1000 MG CAPS Take 2 capsules by mouth daily.    Provider, Historical, [name]  pantoprazole (PROTONIX) 40 MG tablet Take 40 mg by mouth 2 (two) times daily.      Provider, Historical, [name]  polyethylene glycol powder (GLYCOLAX/MIRALAX) powder Mix 17 grams in 8 oz of water twice daily 06/04/16   Armbruster, Renelda Loma, [name]  potassium chloride SA (K-DUR,KLOR-CON) 20 MEQ tablet Take 60 meq (3 tabs) q am and 80 meq (4 tabs) q pm. 02/07/17   Tillery, Satira Mccallum, PA-C  Propylene Glycol (SYSTANE BALANCE) 0.6 % SOLN Place 1 drop into both eyes 3 (three) times daily.    Provider, Historical, [name]  pyridOXINE (VITAMIN B-6) 100 MG tablet Take 100 mg by mouth daily after lunch.     Provider, Historical, [name]  rOPINIRole (REQUIP) 1 MG tablet Take 1 mg by mouth at bedtime and may repeat dose one time if needed. IF RESTLESS LEGS PERSIST PATIENT MAY REPEAT ADDITIONAL DOSE    Provider, Historical, [name]  rosuvastatin (CRESTOR) 10 MG tablet Take 10 mg by mouth at bedtime. 06/18/16   Provider, Historical, [name]  Simethicone Extra Strength 125 MG CAPS Take 1 tablet by mouth daily as needed (for bloating). Reported on 02/23/2016    Provider, Historical, [name]  spironolactone (ALDACTONE) 25 MG tablet Take 1 tablet (25 mg total) by mouth 2 (two) times daily. 02/07/17   Shirley Friar, PA-C  SUMAtriptan (IMITREX) 100 MG tablet Take 100 mg by mouth daily as needed for migraine.  11/28/15   Provider, Historical, [name]  tiotropium (SPIRIVA) 18 MCG inhalation capsule Place 18 mcg into inhaler and inhale daily.      Provider, Historical, [name]  traMADol (ULTRAM) 50 MG tablet Take 1 tablet by mouth daily as needed for moderate pain.  10/15/16   Provider, Historical, [name]  vitamin B-12 (CYANOCOBALAMIN) 1000 MCG tablet Take 1,000 mcg by mouth daily after lunch.     Provider, Historical, [name]  vitamin C (ASCORBIC ACID)  500 MG tablet  Take 500 mg by mouth daily after lunch.     Provider, Historical, [name]  vitamin E 400 UNIT capsule Take 400 Units by mouth daily after lunch.     Provider, Historical, [name]   Family History Family History  Problem Relation Age of Onset  . Diabetes Mellitus II Mother   . Lung cancer Mother   . Congestive Heart Failure Mother   . Coronary artery disease Father        CABG x 3  . Hypertension Father   . Diabetes Mellitus II Father   . Breast cancer Cousin 72       maternal first cousin  . Colon cancer Cousin 15       maternal first cousin  . Uterine cancer Cousin        dx in her 43s  . Colon cancer Maternal Aunt        dx in her late 79s  . Uterine cancer Maternal Aunt 66  . Lung cancer Maternal Aunt   . Uterine cancer Maternal Aunt 38  . Colon cancer Maternal Aunt 25  . Breast cancer Maternal Grandmother 27  . Uterine cancer Maternal Aunt 48  . Uterine cancer Cousin        dx in her 24s  . Uterine cancer Cousin 50   Social History Social History  Substance Use Topics  . Smoking status: Former Smoker    Packs/day: 1.50    Years: 33.00    Types: Cigarettes    Quit date: 04/12/2001  . Smokeless tobacco: Never Used     Comment: heaviest - 2.5- 3PPD x 3 years   . Alcohol use No   Allergies   Bee venom; Aspirin; Ciprofloxacin; Codeine; Naproxen; Other; Sulfonamide derivatives; Xarelto [rivaroxaban]; Atorvastatin; Nsaids; Tape; and Tresiba flextouch [insulin degludec]   Review of Systems Review of Systems  Constitutional: Positive for fatigue.  Respiratory: Positive for shortness of breath.   Cardiovascular: Positive for orthopnea and PND. Negative for chest pain and palpitations.  All other systems reviewed and are negative.    Physical Exam Updated Vital Signs There were no vitals taken for this visit.  Physical Exam  Constitutional: She appears well-developed and well-nourished.  HENT:  Head: Normocephalic.  Mouth/Throat: Oropharynx is clear and moist. No  oropharyngeal exudate.  Eyes: Conjunctivae and EOM are normal. Pupils are equal, round, and reactive to light. Right eye exhibits no discharge. Left eye exhibits no discharge. No scleral icterus.  Neck: Normal range of motion. Neck supple. No JVD present. No tracheal deviation present.  Trachea is midline. No stridor or carotid bruits. No JVD.  Cardiovascular: Normal rate, regular rhythm, normal heart sounds and intact distal pulses.   No murmur heard. Pulmonary/Chest: Effort normal. No stridor. No respiratory distress. She has no wheezes. She has no rales.  Lungs diminished bilaterally.  Abdominal: Soft. Bowel sounds are normal. She exhibits no distension. There is no tenderness. There is no rebound and no guarding.  Musculoskeletal: Normal range of motion. She exhibits no edema or tenderness.  All compartments are soft. No palpable cords.   Lymphadenopathy:    She has no cervical adenopathy.  Neurological: She is alert. She has normal reflexes. She displays normal reflexes. She exhibits normal muscle tone.  Skin: Skin is warm and dry. Capillary refill takes less than 2 seconds.  Psychiatric: She has a normal mood and affect. Her behavior is normal.  Nursing note and vitals reviewed.  ED Treatments / Results  Vitals:   02/11/17 0042  BP: 122/73  Pulse: 96  Resp: 20  Temp: (!) 97.4 F (36.3 C)    DIAGNOSTIC STUDIES: Oxygen Saturation is 100% on RA, normal by my interpretation.   COORDINATION OF CARE: 12:50 AM-Discussed next steps with pt. Pt verbalized understanding and is agreeable with the plan.   Labs (all labs ordered are listed, but only abnormal results are displayed)  Results for orders placed or performed during the hospital encounter of 08/65/78  Basic metabolic panel  Result Value Ref Range   Sodium 126 (L) 135 - 145 mmol/L   Potassium 6.4 (HH) 3.5 - 5.1 mmol/L   Chloride 93 (L) 101 - 111 mmol/L   CO2 23 22 - 32 mmol/L   Glucose, Bld 213 (H) 65 - 99 mg/dL    BUN 36 (H) 6 - 20 mg/dL   Creatinine, Ser 1.49 (H) 0.44 - 1.00 mg/dL   Calcium 9.7 8.9 - 10.3 mg/dL   GFR calc non Af Amer 37 (L) >60 mL/min   GFR calc Af Amer 43 (L) >60 mL/min   Anion gap 10 5 - 15  CBC  Result Value Ref Range   WBC 9.2 4.0 - 10.5 K/uL   RBC 4.96 3.87 - 5.11 MIL/uL   Hemoglobin 14.8 12.0 - 15.0 g/dL   HCT 45.3 36.0 - 46.0 %   MCV 91.3 78.0 - 100.0 fL   MCH 29.8 26.0 - 34.0 pg   MCHC 32.7 30.0 - 36.0 g/dL   RDW 16.9 (H) 11.5 - 15.5 %   Platelets 337 150 - 400 K/uL  Brain natriuretic peptide  Result Value Ref Range   B Natriuretic Peptide 839.7 (H) 0.0 - 100.0 pg/mL  I-stat troponin, ED  Result Value Ref Range   Troponin i, poc 0.00 0.00 - 0.08 ng/mL   Comment 3           *Note: Due to a large number of results and/or encounters for the requested time period, some results have not been displayed. A complete set of results can be found in Results Review.   Dg Orthopantogram  Result Date: 01/23/2017 CLINICAL DATA:  Preoperative placement of left ventricular assist device EXAM: ORTHOPANTOGRAM/PANORAMIC COMPARISON:  None. FINDINGS: The majority of teeth are missing. Remaining teeth show no evidence of abscess or periapical loosening. No fracture or dislocation. No erosive change or bony destruction. IMPRESSION: No dental abscess or periapical lucency in remaining teeth. Patient is completely edentulous superiorly with several teeth missing inferiorly. No bony abnormality beyond resorption in areas of chronic edentulous state. Electronically Signed   By: Lowella Grip III M.D.   On: 01/23/2017 13:48   Dg Chest 2 View  Result Date: 02/11/2017 CLINICAL DATA:  Left-sided chest pain with shortness of breath EXAM: CHEST  2 VIEW COMPARISON:  01/31/2017 FINDINGS: Right-sided central venous catheter tip projects over the cavoatrial region. Left-sided single lead pacing device similar compared to prior. Surgical clips over the left breast. Mild cardiomegaly. Central vascular  congestion and mild diffuse interstitial prominence suggesting mild edema. Possible patchy infiltrate inferiorly on the lateral view. No pleural effusion. Aortic atherosclerosis. No pneumothorax. IMPRESSION: 1. Cardiomegaly with mild central vascular congestion and diffuse interstitial prominence suggesting mild edema 2. Patchy opacity, likely left lung base may reflect a small infiltrate Electronically Signed   By: Donavan Foil M.D.   On: 02/11/2017 01:17   Ct Chest W Contrast  Result Date: 01/27/2017 CLINICAL DATA:  61 year old female under evaluation prior to LVAD placement.  Shortness of breath. EXAM: CT CHEST, ABDOMEN, AND PELVIS WITH CONTRAST TECHNIQUE: Multidetector CT imaging of the chest, abdomen and pelvis was performed following the standard protocol during bolus administration of intravenous contrast. CONTRAST:  11mL ISOVUE-300 IOPAMIDOL (ISOVUE-300) INJECTION 61% COMPARISON:  CT the chest, abdomen and pelvis 12/05/2006. FINDINGS: CT CHEST FINDINGS Cardiovascular: Heart size is mildly enlarged with left ventricular dilatation. There is no significant pericardial fluid, thickening or pericardial calcification. There is aortic atherosclerosis, as well as atherosclerosis of the great vessels of the mediastinum and the coronary arteries, including calcified atherosclerotic plaque in the left anterior descending, left circumflex and right coronary arteries. Although there is aortic atherosclerosis, the extent of calcified atherosclerotic plaque in the ascending thoracic aorta is rather mild. Left-sided pacemaker/AICD with lead tip terminating in the right ventricular apex. Apex of the left ventricle is immediately deep to the left fifth/sixth rib interspace. Separate origin of the left vertebral artery directly off the aortic arch (normal anatomical variant) incidentally noted. Mediastinum/Nodes: There are multiple borderline enlarged and mildly enlarged mediastinal and bilateral hilar lymph nodes,  largest of which measures up to 15 mm in short axis in the right paratracheal nodal station (axial image 16 of series 3). Largest hilar lymph node measures 12 mm in short axis (axial image 26 of series 3). No internal mammary lymphadenopathy. Borderline enlarged right supraclavicular lymph node (axial image 80 of series 3). Esophagus is unremarkable in appearance. No axillary lymphadenopathy. Surgical clips in the left axilla, presumably from prior lymph node dissection. Lungs/Pleura: In the posterior aspect of the left upper lobe (axial image 58 of series 4) there is a 1.5 x 1.1 cm macrolobulated nodule which is new compared to the prior examination. There are also areas of nodular architectural distortion in the apex of the left upper lobe, largest of which measures 11 x 13 mm (axial image 15 of series 4). Subpleural reticulation is noted in the anterior aspect of the right upper and middle lobes immediately deep to the right breast, presumably sequela of prior radiation therapy. Mild paraseptal emphysema. No acute consolidative airspace disease. Trace right pleural effusion lying dependently. No left pleural effusion. Musculoskeletal: Status post right modified radical mastectomy. Surgical clips in the left breast likely from prior biopsy or lumpectomy. New sclerotic lesion in the anterolateral aspect of the right sixth rib (axial image 100 of series 4), concerning for a new bone metastasis. CT ABDOMEN PELVIS FINDINGS Hepatobiliary: Nodular contour of the liver, concerning for cirrhosis. No discrete cystic or solid hepatic lesions. No intra or extrahepatic biliary ductal dilatation. Status post cholecystectomy. Pancreas: No pancreatic mass. No pancreatic ductal dilatation. No pancreatic or peripancreatic fluid or inflammatory changes. Spleen: Unremarkable. Adrenals/Urinary Tract: 23 x 18 mm left adrenal nodule (Axial image 57 of series 3) is essentially unchanged compared to the prior examination, suggesting a  benign lesion such as an adenoma. Right adrenal gland is normal in appearance. Multiple subcentimeter low-attenuation lesions in both kidneys are too small to definitively characterize, but are favored to represent tiny cysts. No hydroureteronephrosis. Urinary bladder is normal in appearance. Stomach/Bowel: The appearance of the stomach is normal. There is no pathologic dilatation of small bowel or colon. Normal appendix. Vascular/Lymphatic: Aortic atherosclerosis, without evidence of aneurysm or dissection in the abdominal or pelvic vasculature. Multiple borderline enlarged and mildly enlarged inguinal lymph nodes bilaterally measuring up to 15 mm in short axis on the right (axial image 108 of series 3). These are similar compared to the prior examination from 2008, presumably benign.  No other lymphadenopathy is noted elsewhere in the abdomen or pelvis. Reproductive: Status post total abdominal hysterectomy. Ovaries are atrophic. Other: No unexpected soft tissue mass identified in the left upper quadrant of the abdomen to indicate an impediment to LVAD placement. No significant volume of ascites. No pneumoperitoneum. Musculoskeletal: Small sclerotic lesion with narrow zone of transition in the left femoral head is unchanged compared to 2008, presumably a small bone island. No other definite aggressive appearing lytic or blastic lesions are noted in the visualized portions of the skeleton. IMPRESSION: 1. Cardiomegaly with left ventricular dilatation. No unexpected findings to indicate structural impediment to left ventricular assist device (LVAD) placement. 2. New pulmonary nodules in the left lung, most concerning of which is a 1.5 x 1.1 cm nodule in the posterior left upper lobe (axial image 58 of series 4). Whether these findings reflect metastatic disease or primary bronchogenic malignancy is uncertain giving the smoking related changes in the lungs. Further evaluation with PET-CT is recommended in the near  future. At the time of follow-up PET-CT, attention should also be directed to mediastinal and hilar lymphadenopathy, as well as a lesion in the anterolateral aspect of the right sixth rib, which could all reflect metastatic disease. 3. Aortic atherosclerosis, in addition to three-vessel coronary artery disease. Please note that although the presence of coronary artery calcium documents the presence of coronary artery disease, the severity of this disease and any potential stenosis cannot be assessed on this non-gated CT examination. Assessment for potential risk factor modification, dietary therapy or pharmacologic therapy may be warranted, if clinically indicated. 4. Mild paraseptal emphysema. 5. Trace right pleural effusion lying dependently. 6. Morphologic changes in the liver suggestive of cirrhosis. No discrete hepatic lesion identified at this time. 7. Stable left adrenal nodule likely to represent an adenoma. 8. Additional incidental findings, as above. Electronically Signed   By: Vinnie Langton M.D.   On: 01/27/2017 10:12   Ct Abdomen Pelvis W Contrast  Result Date: 01/27/2017 CLINICAL DATA:  61 year old female under evaluation prior to LVAD placement. Shortness of breath. EXAM: CT CHEST, ABDOMEN, AND PELVIS WITH CONTRAST TECHNIQUE: Multidetector CT imaging of the chest, abdomen and pelvis was performed following the standard protocol during bolus administration of intravenous contrast. CONTRAST:  114mL ISOVUE-300 IOPAMIDOL (ISOVUE-300) INJECTION 61% COMPARISON:  CT the chest, abdomen and pelvis 12/05/2006. FINDINGS: CT CHEST FINDINGS Cardiovascular: Heart size is mildly enlarged with left ventricular dilatation. There is no significant pericardial fluid, thickening or pericardial calcification. There is aortic atherosclerosis, as well as atherosclerosis of the great vessels of the mediastinum and the coronary arteries, including calcified atherosclerotic plaque in the left anterior descending, left  circumflex and right coronary arteries. Although there is aortic atherosclerosis, the extent of calcified atherosclerotic plaque in the ascending thoracic aorta is rather mild. Left-sided pacemaker/AICD with lead tip terminating in the right ventricular apex. Apex of the left ventricle is immediately deep to the left fifth/sixth rib interspace. Separate origin of the left vertebral artery directly off the aortic arch (normal anatomical variant) incidentally noted. Mediastinum/Nodes: There are multiple borderline enlarged and mildly enlarged mediastinal and bilateral hilar lymph nodes, largest of which measures up to 15 mm in short axis in the right paratracheal nodal station (axial image 16 of series 3). Largest hilar lymph node measures 12 mm in short axis (axial image 26 of series 3). No internal mammary lymphadenopathy. Borderline enlarged right supraclavicular lymph node (axial image 80 of series 3). Esophagus is unremarkable in appearance. No axillary lymphadenopathy. Surgical  clips in the left axilla, presumably from prior lymph node dissection. Lungs/Pleura: In the posterior aspect of the left upper lobe (axial image 58 of series 4) there is a 1.5 x 1.1 cm macrolobulated nodule which is new compared to the prior examination. There are also areas of nodular architectural distortion in the apex of the left upper lobe, largest of which measures 11 x 13 mm (axial image 15 of series 4). Subpleural reticulation is noted in the anterior aspect of the right upper and middle lobes immediately deep to the right breast, presumably sequela of prior radiation therapy. Mild paraseptal emphysema. No acute consolidative airspace disease. Trace right pleural effusion lying dependently. No left pleural effusion. Musculoskeletal: Status post right modified radical mastectomy. Surgical clips in the left breast likely from prior biopsy or lumpectomy. New sclerotic lesion in the anterolateral aspect of the right sixth rib (axial  image 100 of series 4), concerning for a new bone metastasis. CT ABDOMEN PELVIS FINDINGS Hepatobiliary: Nodular contour of the liver, concerning for cirrhosis. No discrete cystic or solid hepatic lesions. No intra or extrahepatic biliary ductal dilatation. Status post cholecystectomy. Pancreas: No pancreatic mass. No pancreatic ductal dilatation. No pancreatic or peripancreatic fluid or inflammatory changes. Spleen: Unremarkable. Adrenals/Urinary Tract: 23 x 18 mm left adrenal nodule (Axial image 57 of series 3) is essentially unchanged compared to the prior examination, suggesting a benign lesion such as an adenoma. Right adrenal gland is normal in appearance. Multiple subcentimeter low-attenuation lesions in both kidneys are too small to definitively characterize, but are favored to represent tiny cysts. No hydroureteronephrosis. Urinary bladder is normal in appearance. Stomach/Bowel: The appearance of the stomach is normal. There is no pathologic dilatation of small bowel or colon. Normal appendix. Vascular/Lymphatic: Aortic atherosclerosis, without evidence of aneurysm or dissection in the abdominal or pelvic vasculature. Multiple borderline enlarged and mildly enlarged inguinal lymph nodes bilaterally measuring up to 15 mm in short axis on the right (axial image 108 of series 3). These are similar compared to the prior examination from 2008, presumably benign. No other lymphadenopathy is noted elsewhere in the abdomen or pelvis. Reproductive: Status post total abdominal hysterectomy. Ovaries are atrophic. Other: No unexpected soft tissue mass identified in the left upper quadrant of the abdomen to indicate an impediment to LVAD placement. No significant volume of ascites. No pneumoperitoneum. Musculoskeletal: Small sclerotic lesion with narrow zone of transition in the left femoral head is unchanged compared to 2008, presumably a small bone island. No other definite aggressive appearing lytic or blastic lesions  are noted in the visualized portions of the skeleton. IMPRESSION: 1. Cardiomegaly with left ventricular dilatation. No unexpected findings to indicate structural impediment to left ventricular assist device (LVAD) placement. 2. New pulmonary nodules in the left lung, most concerning of which is a 1.5 x 1.1 cm nodule in the posterior left upper lobe (axial image 58 of series 4). Whether these findings reflect metastatic disease or primary bronchogenic malignancy is uncertain giving the smoking related changes in the lungs. Further evaluation with PET-CT is recommended in the near future. At the time of follow-up PET-CT, attention should also be directed to mediastinal and hilar lymphadenopathy, as well as a lesion in the anterolateral aspect of the right sixth rib, which could all reflect metastatic disease. 3. Aortic atherosclerosis, in addition to three-vessel coronary artery disease. Please note that although the presence of coronary artery calcium documents the presence of coronary artery disease, the severity of this disease and any potential stenosis cannot be assessed  on this non-gated CT examination. Assessment for potential risk factor modification, dietary therapy or pharmacologic therapy may be warranted, if clinically indicated. 4. Mild paraseptal emphysema. 5. Trace right pleural effusion lying dependently. 6. Morphologic changes in the liver suggestive of cirrhosis. No discrete hepatic lesion identified at this time. 7. Stable left adrenal nodule likely to represent an adenoma. 8. Additional incidental findings, as above. Electronically Signed   By: Vinnie Langton M.D.   On: 01/27/2017 10:12   Ir Fluoro Guide Cv Line Right  Result Date: 02/04/2017 CLINICAL DATA:  Heart failure and need for tunneled central venous access for IV milrinone therapy. EXAM: TUNNELED CENTRAL VENOUS HEMODIALYSIS CATHETER PLACEMENT WITH ULTRASOUND AND FLUOROSCOPIC GUIDANCE ANESTHESIA/SEDATION: None MEDICATIONS: None  FLUOROSCOPY TIME:  36 seconds.  7.6 mGy. PROCEDURE: The procedure, risks, benefits, and alternatives were explained to the patient. Questions regarding the procedure were encouraged and answered. The patient understands and consents to the procedure. A timeout was performed prior to initiating the procedure. Ultrasound was used to confirm patency of the right internal jugular vein. The right neck and chest were prepped with chlorhexidine in a sterile fashion, and a sterile drape was applied covering the operative field. Maximum barrier sterile technique with sterile gowns and gloves were used for the procedure. Local anesthesia was provided with 1% lidocaine. After creating a small venotomy incision, a 21 gauge needle was advanced into the right internal jugular vein under direct, real-time ultrasound guidance. Ultrasound image documentation was performed. After securing guidewire access, an 8 Fr dilator was placed. A wire was kinked to measure appropriate catheter length. A 6 French, dual-lumen power line tunneled central venous catheter was chosen for placement. This was tunneled in a retrograde fashion from the chest wall to the venotomy incision. At the venotomy, a 6 Fr peel-away sheath was placed over a guidewire. The catheter was cut to 24 cm then placed through the sheath and the sheath removed. Final catheter positioning was confirmed and documented with a fluoroscopic spot image. The catheter was aspirated and flushed with saline. The venotomy incision was closed with subcuticular 4-0 Vicryl. Dermabond was applied to the incision. The catheter exit site was secured with Prolene retention sutures. COMPLICATIONS: None.  No pneumothorax. FINDINGS: After catheter placement, the tip lies at the cavoatrial junction. The catheter aspirates normally and is ready for immediate use. IMPRESSION: Placement of tunneled central venous catheter via the right internal jugular vein. The catheter tip lies at the cavoatrial  junction. The catheter is ready for immediate use. Electronically Signed   By: Aletta Edouard M.D.   On: 02/04/2017 17:11   Ir US Guide Vasc Access Right  Result Date: 02/04/2017 CLINICAL DATA:  Heart failure and need for tunneled central venous access for IV milrinone therapy. EXAM: TUNNELED CENTRAL VENOUS HEMODIALYSIS CATHETER PLACEMENT WITH ULTRASOUND AND FLUOROSCOPIC GUIDANCE ANESTHESIA/SEDATION: None MEDICATIONS: None FLUOROSCOPY TIME:  36 seconds.  7.6 mGy. PROCEDURE: The procedure, risks, benefits, and alternatives were explained to the patient. Questions regarding the procedure were encouraged and answered. The patient understands and consents to the procedure. A timeout was performed prior to initiating the procedure. Ultrasound was used to confirm patency of the right internal jugular vein. The right neck and chest were prepped with chlorhexidine in a sterile fashion, and a sterile drape was applied covering the operative field. Maximum barrier sterile technique with sterile gowns and gloves were used for the procedure. Local anesthesia was provided with 1% lidocaine. After creating a small venotomy incision, a 21 gauge needle  was advanced into the right internal jugular vein under direct, real-time ultrasound guidance. Ultrasound image documentation was performed. After securing guidewire access, an 8 Fr dilator was placed. A wire was kinked to measure appropriate catheter length. A 6 French, dual-lumen power line tunneled central venous catheter was chosen for placement. This was tunneled in a retrograde fashion from the chest wall to the venotomy incision. At the venotomy, a 6 Fr peel-away sheath was placed over a guidewire. The catheter was cut to 24 cm then placed through the sheath and the sheath removed. Final catheter positioning was confirmed and documented with a fluoroscopic spot image. The catheter was aspirated and flushed with saline. The venotomy incision was closed with subcuticular 4-0  Vicryl. Dermabond was applied to the incision. The catheter exit site was secured with Prolene retention sutures. COMPLICATIONS: None.  No pneumothorax. FINDINGS: After catheter placement, the tip lies at the cavoatrial junction. The catheter aspirates normally and is ready for immediate use. IMPRESSION: Placement of tunneled central venous catheter via the right internal jugular vein. The catheter tip lies at the cavoatrial junction. The catheter is ready for immediate use. Electronically Signed   By: Aletta Edouard M.D.   On: 02/04/2017 17:11   Dg Chest Port 1 View  Result Date: 01/31/2017 CLINICAL DATA:  Central line placement, history systolic heart failure, breast cancer, asthma, GERD, coronary artery disease post MI, COPD EXAM: PORTABLE CHEST 1 VIEW COMPARISON:  Portable exam 1757 hours compared to 12/21/2016 FINDINGS: LEFT subclavian AICD lead with tip projecting over RIGHT ventricle. RIGHT subclavian central venous catheter with tip projecting over SVC. Enlargement of cardiac silhouette with pulmonary vascular congestion. Atherosclerotic calcification aorta. Interstitial infiltrates compatible pulmonary edema and CHF. No pleural effusion or pneumothorax. Bones demineralized. IMPRESSION: Mild CHF. Aortic Atherosclerosis (ICD10-I70.0). Electronically Signed   By: Lavonia Dana M.D.   On: 01/31/2017 18:06    EKG  EKG Interpretation  Date/Time:  Tuesday February 11 2017 00:39:31 EDT Ventricular Rate:  95 PR Interval:    QRS Duration: 166 QT Interval:  407 QTC Calculation: 512 R Axis:   -113 Text Interpretation:  sinus rhythm first degree avb Right bundle branch block Confirmed by Randal Buba, Garron Eline ([id number]) on 02/11/2017 12:43:21 AM        Procedures Procedures (including critical care time)  Medications Ordered in ED  Medications  albuterol (PROVENTIL) (2.5 MG/3ML) 0.083% nebulizer solution 5 mg (not administered)  sodium bicarbonate injection 50 mEq (not administered)  sodium polystyrene  (KAYEXALATE) 15 GM/60ML suspension 30 g (not administered)  furosemide (LASIX) injection 40 mg (40 mg Intravenous Given 02/11/17 0141)      Final Clinical Impressions(s) / ED Diagnoses  CHF and hyperkalemia: will admit to cardiology.  Case d/w fellow who will admit the patient   I personally performed the services described in this documentation, which was scribed in my presence. The recorded information has been reviewed and is accurate.      Asani Mcburney, [name] 02/11/17 [id number]

## 2017-02-11 NOTE — Care Management Note (Signed)
Case Management Note  Patient Details  Name: [name] MRN: [id number] Date of Birth: 03/03/1956  Subjective/Objective:     Pt is a readmit with CHF               Action/Plan:  PTA from home alone however independent on IV milrinone with [facility].  Pt has CPAP and rollator in the home.  [facility] aware of admit and will follow pt to resume services at discharge    Expected Discharge Date:                  Expected Discharge Plan:  [place]  In-House Referral:     Discharge planning Services  CM Consult  Post Acute Care Choice:    Choice offered to:  Patient  DME Arranged:  IV pump/equipment DME Agency:  [place]:  RN [facility] Agency:  [place]  Status of Service:  In process, will continue to follow  If discussed at Long Length of Stay Meetings, dates discussed:    Additional Comments:  Maryclare Labrador, RN 02/11/2017, 9:32 AM

## 2017-02-12 ENCOUNTER — Other Ambulatory Visit (HOSPITAL_COMMUNITY): Payer: Self-pay | Admitting: Cardiology

## 2017-02-12 DIAGNOSIS — I509 Heart failure, unspecified: Secondary | ICD-10-CM

## 2017-02-12 LAB — CBC WITH DIFFERENTIAL/PLATELET
BASOS ABS: 0.1 10*3/uL (ref 0.0–0.1)
Basophils Relative: 1 %
Eosinophils Absolute: 0.2 10*3/uL (ref 0.0–0.7)
Eosinophils Relative: 2 %
HEMATOCRIT: 41.9 % (ref 36.0–46.0)
HEMOGLOBIN: 13.3 g/dL (ref 12.0–15.0)
LYMPHS PCT: 11 %
Lymphs Abs: 1 10*3/uL (ref 0.7–4.0)
MCH: 29.2 pg (ref 26.0–34.0)
MCHC: 31.7 g/dL (ref 30.0–36.0)
MCV: 91.9 fL (ref 78.0–100.0)
MONO ABS: 0.8 10*3/uL (ref 0.1–1.0)
MONOS PCT: 9 %
NEUTROS ABS: 7.3 10*3/uL (ref 1.7–7.7)
NEUTROS PCT: 77 %
Platelets: 299 10*3/uL (ref 150–400)
RBC: 4.56 MIL/uL (ref 3.87–5.11)
RDW: 17 % — AB (ref 11.5–15.5)
WBC: 9.3 10*3/uL (ref 4.0–10.5)

## 2017-02-12 LAB — BASIC METABOLIC PANEL
ANION GAP: 7 (ref 5–15)
Anion gap: 6 (ref 5–15)
BUN: 33 mg/dL — ABNORMAL HIGH (ref 6–20)
BUN: 33 mg/dL — AB (ref 6–20)
CHLORIDE: 92 mmol/L — AB (ref 101–111)
CO2: 32 mmol/L (ref 22–32)
CO2: 33 mmol/L — AB (ref 22–32)
CREATININE: 1.44 mg/dL — AB (ref 0.44–1.00)
Calcium: 8.6 mg/dL — ABNORMAL LOW (ref 8.9–10.3)
Calcium: 9.1 mg/dL (ref 8.9–10.3)
Chloride: 95 mmol/L — ABNORMAL LOW (ref 101–111)
Creatinine, Ser: 1.34 mg/dL — ABNORMAL HIGH (ref 0.44–1.00)
GFR calc Af Amer: 44 mL/min — ABNORMAL LOW (ref >60)
GFR calc non Af Amer: 42 mL/min — ABNORMAL LOW (ref >60)
GFR calc non Af Amer: 38 mL/min — ABNORMAL LOW (ref >60)
GFR, EST AFRICAN AMERICAN: 48 mL/min — AB (ref >60)
GLUCOSE: 123 mg/dL — AB (ref 65–99)
GLUCOSE: 136 mg/dL — AB (ref 65–99)
POTASSIUM: 3.5 mmol/L (ref 3.5–5.1)
Potassium: 3.4 mmol/L — ABNORMAL LOW (ref 3.5–5.1)
SODIUM: 131 mmol/L — AB (ref 135–145)
Sodium: 134 mmol/L — ABNORMAL LOW (ref 135–145)

## 2017-02-12 LAB — GLUCOSE, CAPILLARY
GLUCOSE-CAPILLARY: 124 mg/dL — AB (ref 65–99)
GLUCOSE-CAPILLARY: 50 mg/dL — AB (ref 65–99)
GLUCOSE-CAPILLARY: 121 mg/dL — AB (ref 65–99)
Glucose-Capillary: 117 mg/dL — ABNORMAL HIGH (ref 65–99)
Glucose-Capillary: 134 mg/dL — ABNORMAL HIGH (ref 65–99)

## 2017-02-12 LAB — COOXEMETRY PANEL
CARBOXYHEMOGLOBIN: 1.4 % (ref 0.5–1.5)
Methemoglobin: 0.9 % (ref 0.0–1.5)
O2 SAT: 68.2 %
Total hemoglobin: 13.6 g/dL (ref 12.0–16.0)

## 2017-02-12 LAB — MAGNESIUM: MAGNESIUM: 1.6 mg/dL — AB (ref 1.7–2.4)

## 2017-02-12 MED ORDER — AMIODARONE HCL 200 MG PO TABS: 200.0000 mg | ORAL_TABLET | Freq: Two times a day (BID) | ORAL | 3 refills | Status: DC

## 2017-02-12 MED ORDER — MAGNESIUM SULFATE 4 GM/100ML IV SOLN
4.0000 g | Freq: Once | INTRAVENOUS | Status: AC
  Administered 2017-02-12: 4 g via INTRAVENOUS
  Filled 2017-02-12: qty 100

## 2017-02-12 MED ORDER — DOXYCYCLINE HYCLATE 100 MG PO TABS: 100.0000 mg | ORAL_TABLET | Freq: Two times a day (BID) | ORAL | 0 refills | Status: DC

## 2017-02-12 MED ORDER — FLUCONAZOLE 100 MG PO TABS
200.0000 mg | ORAL_TABLET | Freq: Every day | ORAL | Status: DC
  Administered 2017-02-12: 200 mg via ORAL
  Filled 2017-02-12: qty 2

## 2017-02-12 MED ORDER — POTASSIUM CHLORIDE CRYS ER 20 MEQ PO TBCR: 60.0000 meq | EXTENDED_RELEASE_TABLET | Freq: Two times a day (BID) | ORAL | 3 refills | Status: DC

## 2017-02-12 MED ORDER — FLUCONAZOLE 200 MG PO TABS
200.0000 mg | ORAL_TABLET | Freq: Every day | ORAL | 0 refills | Status: DC
Start: 2017-02-13 — End: 2017-03-06

## 2017-02-12 MED ORDER — POTASSIUM CHLORIDE CRYS ER 20 MEQ PO TBCR
40.0000 meq | EXTENDED_RELEASE_TABLET | Freq: Once | ORAL | Status: AC
  Administered 2017-02-12: 40 meq via ORAL
  Filled 2017-02-12: qty 2

## 2017-02-12 MED ORDER — TORSEMIDE 20 MG PO TABS: 40.0000 mg | ORAL_TABLET | Freq: Two times a day (BID) | ORAL | 6 refills | Status: DC

## 2017-02-12 MED ORDER — DOXYCYCLINE HYCLATE 100 MG PO TABS
100.0000 mg | ORAL_TABLET | Freq: Two times a day (BID) | ORAL | Status: DC
  Administered 2017-02-12: 100 mg via ORAL
  Filled 2017-02-12: qty 1

## 2017-02-12 NOTE — Progress Notes (Signed)
Discharged home by wheelchair accompanied by daughter.Discharged instructions given to pt. Belongings taken home.

## 2017-02-12 NOTE — Progress Notes (Signed)
Advanced Heart Failure Rounding Note  Primary HF: Dr. Haroldine Laws   Subjective:    Admitted from ED 02/10/17 with Hyperkalemia and dyspnea.   Mild chest pain this am.   Dull mid chest with sharp pain on left side. Was sitting at rest when started. Now resolved. Recent cath with non-obstructive CAD.   Feeling much better. No orthopnea lying flat. No SOB walking around room. Anxious to go home to see her dog.   Negative 340 cc yesterday with IV lasix 80 mg BID. Negative 1.6 total this admit. Weight down 4 lbs (3 lbs below discharge weight).  Coox 68.2% this am on milrinone 0.375 mcg/kg/min. CVP 10-11 this am.    Objective:   Weight Range: 199 lb 11.2 oz (90.6 kg) Body mass index is 33.23 kg/m.   Vital Signs:   Temp:  [97.5 F (36.4 C)-98 F (36.7 C)] 97.5 F (36.4 C) (07/11 0758) Pulse Rate:  [100-109] 100 (07/11 0758) Resp:  [20-29] 20 (07/11 0758) BP: (92-103)/(58-64) 92/63 (07/11 0758) SpO2:  [95 %-99 %] 95 % (07/11 0416) Weight:  [199 lb 11.2 oz (90.6 kg)] 199 lb 11.2 oz (90.6 kg) (07/11 0416) Last BM Date: 02/09/17  Weight change: Filed Weights   02/11/17 0600 02/12/17 0416  Weight: 203 lb 8 oz (92.3 kg) 199 lb 11.2 oz (90.6 kg)    Intake/Output:   Intake/Output Summary (Last 24 hours) at 02/12/17 0816 Last data filed at 02/12/17 0439  Gross per 24 hour  Intake           1060.5 ml  Output             1100 ml  Net            -39.5 ml      Physical Exam    General: elderly appearing. Lying flat with no resp diff.  HEENT: Normal Neck: Supple. JVP 10-11. Carotids 2+ bilat; no bruits. No thyromegaly or nodule noted. Cor: PMI nondisplaced. Regular. No M/G/R noted.  Lungs: CTAB, normal effort. Abdomen: Soft, non-tender, non-distended, no HSM. No bruits or masses. +BS  Extremities: No cyanosis, clubbing, rash, R and LLE no edema.  Axillary: Left axillary area with circumferential area concerning for fungal infection. Boil with 1.5-2cm track across armpit  with + serous drainage.   Neuro: Alert & orientedx3, cranial nerves grossly intact. moves all 4 extremities w/o difficulty. Affect pleasant   Telemetry   Personally reviewed, SV tach vs Afib Rate in 100s.   EKG    Personally reviewed, Read as accelerated junctional rhythm.   Labs    CBC  Recent Labs  02/11/17 0545 02/12/17 0423  WBC 7.9 9.3  NEUTROABS 6.0 7.3  HGB 14.3 13.3  HCT 43.7 41.9  MCV 90.5 91.9  PLT 307 767   Basic Metabolic Panel  Recent Labs  02/11/17 1330 02/12/17 0423  NA 132* 134*  K 3.2* 3.5  CL 94* 95*  CO2 28 32  GLUCOSE 136* 123*  BUN 32* 33*  CREATININE 1.37* 1.34*  CALCIUM 9.3 8.6*  MG  --  1.6*   Liver Function Tests No results for input(s): AST, ALT, ALKPHOS, BILITOT, PROT, ALBUMIN in the last 72 hours. No results for input(s): LIPASE, AMYLASE in the last 72 hours. Cardiac Enzymes No results for input(s): CKTOTAL, CKMB, CKMBINDEX, TROPONINI in the last 72 hours.  BNP: BNP (last 3 results)  Recent Labs  07/23/16 1200 12/21/16 1054 02/11/17 0019  BNP 182.4* 474.9* 839.7*    ProBNP (  last 3 results) No results for input(s): PROBNP in the last 8760 hours.   D-Dimer No results for input(s): DDIMER in the last 72 hours. Hemoglobin A1C No results for input(s): HGBA1C in the last 72 hours. Fasting Lipid Panel No results for input(s): CHOL, HDL, LDLCALC, TRIG, CHOLHDL, LDLDIRECT in the last 72 hours. Thyroid Function Tests No results for input(s): TSH, T4TOTAL, T3FREE, THYROIDAB in the last 72 hours.  Invalid input(s): FREET3  Other results:   Imaging    No results found.   Medications:     Scheduled Medications: . amiodarone  400 mg Oral BID  . apixaban  5 mg Oral BID  . busPIRone  30 mg Oral BID  . cholecalciferol  2,000 Units Oral Daily  . docusate sodium  300 mg Oral QHS  . DULoxetine  30 mg Oral Daily  . fluticasone  2 spray Each Nare Daily  . furosemide  80 mg Intravenous BID  . insulin aspart  0-20 Units  Subcutaneous TID WC  . insulin aspart  0-20 Units Subcutaneous QHS  . insulin aspart  14 Units Subcutaneous TID WC  . insulin aspart protamine- aspart  50 Units Subcutaneous BID WC  . levothyroxine  75 mcg Oral QAC breakfast  . mouth rinse  15 mL Mouth Rinse BID  . Melatonin  1.5 mg Oral QHS  . pantoprazole  40 mg Oral BID  . potassium chloride  40 mEq Oral BID  . pyridOXINE  100 mg Oral QPC lunch  . rOPINIRole  1 mg Oral QHS,MR X 1  . rosuvastatin  10 mg Oral QHS  . sodium chloride flush  3 mL Intravenous Q12H  . tiotropium  18 mcg Inhalation Daily  . vitamin B-12  1,000 mcg Oral QPC lunch  . vitamin C  500 mg Oral QPC lunch  . vitamin E  400 Units Oral QPC lunch    Infusions: . sodium chloride    . magnesium sulfate 1 - 4 g bolus IVPB    . milrinone 0.375 mcg/kg/min (02/12/17 0228)    PRN Medications: sodium chloride, acetaminophen, ALPRAZolam, baclofen, hydrOXYzine, ondansetron (ZOFRAN) IV, polyethylene glycol, sodium chloride flush, SUMAtriptan   RHC 02/07/2017 RA = 16 RV = 52/16 PA = 54/27 (40) PCW = 29  Fick cardiac output/index = 3.5/1.8 PVR = 3.1 WU FA sat = 93% PA sat = 50%, 53% PaPi = 1.7 RA/PCW = 0.55  Patient Profile   [name] is a 61 year old with a history of R 2005/2008 and L 2017 breast cancer, HTN, DM, IBS, hyperlipidemia, chronic systolic heart failure, NICM chemo induced cardiomyopathy, St Jude ICD, and GI bleed S/B AVMs. Admitted 02/10/17 with Hyperkalemia and DOE.   Assessment/Plan   1. Acute on chronic systolic HF - Echo 4/0/3474 LVEF 15%, mild MR, PA peak pressure 40 mm - RHC 02/07/17 with persistent biventricular failure and elevated filling pressures. Marginal cardiac output.  - Weight down 4 lbs from admit and CVP 10-11.  - Give IV lasix 80 mg this am, and then transition to torsemide.  - Continue K supp.  2. Hyperkalemia - Resolved. Normally she runs low. Hyperkalemia in setting of high K supp with no diuretics.  3. Afib - Rate  controlled.  - Failed DCCV x 2 separate attempts last admission. - Continue amiodarone 400 mg BID for now. Will decrease to 200 mg BID on discharge.  4. H/o GI bleed - Multiple scopes with most recent 03/2016. Has h/o polyps and AVms - No  bleeding. No change.  5. DM2 - Continue SSI. No change.  6. OSA - Continue CPAP qhs.   7. H/o NSVT - Continue amiodarone.  - Supp Mg this am.  - K improved. Continue normal supp.  8. Axillary boil and rash -> ? hydradenitis suppurativa with superimposed fungal infection - Pt states she has seen a specialist and previously put on an antibiotic. Not sure which one. - States she was told she would need an I&D.  Joanne Grinder, NP examined. Concern for fungal component. Also has a pocket tracks 1.5-2 cm with cotton tipped applicator - Will cover with Diflucan + Doxycycline. Will need close ID vs Derm follow up.   CVP down to 10-11. Give IV lasix this am. Possibly home in next 24 hours.   Length of Stay: 1  [name], [place]  02/12/2017, 8:16 AM  Advanced Heart Failure Team Pager [phone] (M-F; 7a - 4p)  Please contact [name] for night-coverage after hours (4p -7a ) and weekends on [email or web address]     Patient seen and examined with the above-signed Advanced Practice Provider and/or Housestaff. I personally reviewed laboratory data, imaging studies and relevant notes. I independently examined the patient and formulated the important aspects of the plan. I have edited the note to reflect any of my changes or salient points. I have personally discussed the plan with the patient and/or family.  She is improved today. HF stabilized with IV milrinone. Back in NSR. Will treat wounds with doxy. Continue LVAD w/u. Will d/c home with close HF f/u  Glori Bickers, MD  7:56 PM

## 2017-02-12 NOTE — Progress Notes (Signed)
cbg-50 asymptomatic, apple juice given and tolerated lunch well. NP aware.Repeat cbg- 121mg /dl

## 2017-02-12 NOTE — Progress Notes (Signed)
Milrinone gtt .transitioned to home pump by advance home care nurse.

## 2017-02-12 NOTE — Progress Notes (Signed)
Boil to left armpit which she claimed she has this for few months, with serosanguinous drainage in minimal amount, PA made aware with order. Site cleansed with saline and dressed.

## 2017-02-12 NOTE — Progress Notes (Signed)
Pt called out having chest pain, pt complained of dull chest pain mid chest and radiated to a sharp chest pain on the left. Pt sitting up in chair when dull pains started and it progressed to sharp chest pain while patient was lying on left side. Once RN arrived to room chest pain resolved. EKG done. MD paged. Will continue to monitor.

## 2017-02-12 NOTE — Care Management Note (Addendum)
Case Management Note  Patient Details  Name: [name] MRN: [id number] Date of Birth: 1955/08/25  Subjective/Objective:     Pt is a readmit with CHF               Action/Plan:  PTA from home alone however independent on IV milrinone with [facility].  Pt has CPAP and rollator in the home.  [facility] aware of admit and will follow pt to resume services at discharge    Expected Discharge Date:                  Expected Discharge Plan:  [place]  In-House Referral:     Discharge planning Services  CM Consult  Post Acute Care Choice:    Choice offered to:  Patient  DME Arranged:  IV pump/equipment DME Agency:  [place]:  RN [facility] Agency:  [place]  Status of Service:  In process, will continue to follow  If discussed at Long Length of Stay Meetings, dates discussed:    Additional Comments: [facility] set up Milrinone prior to discharge.  [facility] will resume HHRN and initiate other [place] resources including PT as deemed necessary per HF order.  Pt alert and oriented.  Pt plans on discharging back to her home alone at discharge.  Pt has working CPAP machine in the home along with walker and cane.  Pt is independently walking in the hospital room and states she is back at baseline mobility wise.  [facility] following for IV inotrope/HH RN  at discharge. Pt weighs daily and adheres to low salt diet.  Pt is tentatively scheduled for LVAD insertion 8/16.  Pt has PCP and denies barriers with obtaining medications as prescribed Maryclare Labrador, RN 02/12/2017, 2:16 PM

## 2017-02-12 NOTE — Discharge Summary (Addendum)
Advanced Heart Failure Discharge Note  Discharge Summary   Patient ID: [name] MRN: [id number], DOB/AGE: 1956/03/09 61 y.o. Admit date: 02/11/2017 D/C date:     02/12/2017   Primary Discharge Diagnoses:  1. Acute on chronic systolic HF 2. Hyperkalemia 3. Afib 4. H/o GI bleed 5. DM2 6. OSA 7. H/o NSVT 8. Axillary boil and rash -> ? hydradenitis suppurativa with superimposed fungal infection 9. Hyponatremia  Hospital Course:   [name] is a 61 year old with a history of R 2005/2008 and L 2017 breast cancer, HTN, DM, IBS, hyperlipidemia, chronic systolic heart failure, NICM chemo induced cardiomyopathy, St Jude ICD, and GI bleed S/B AVMs.   Admitted 02/10/17 with Hyperkalemia and DOE in the setting of lack of diuretics.   Hyperkalemia rapidly corrected with Kayexalate and re-initiation of diuretics. Diuresed on IV lasix up to 80 mg BID with improvement in symptoms.   Hospital course complicated by axillary boil and rash brought to our attention by patient. Wound Culture from March with Morganella species, but thought to be skin contaminant by ID. Wound culture re-done this admit. Started on Diflucan and doxycycline. Instructed patient to call for follow up (Previously seen by ID). Pt has referral to see General surgery but has not yet followed up.   Pt improved with management of electrolytes and diuresis. Patient examined 02/12/17 and determined to be stable for discharge with close follow up as below.    Overall patient diuresed 2.2 L. Labs prior to discharge with K 3.4. Given additional supp prior to discharge. Transitioned to torsemide 40 mg BID for home.   Pt will have close lab follow up with transition from lasix to torsemide and labile potassium levels.   Discharge Weight Range: 199 lbs Discharge Vitals: Blood pressure (!) 86/56, pulse 99, temperature 97.6 F (36.4 C), temperature source Oral, resp. rate (!) 23, height 5\' 5"  (1.651 m), weight 199 lb 11.2 oz (90.6  kg), SpO2 96 %.  Labs: Lab Results  Component Value Date   WBC 9.3 02/12/2017   HGB 13.3 02/12/2017   HCT 41.9 02/12/2017   MCV 91.9 02/12/2017   PLT 299 02/12/2017     Recent Labs Lab 02/12/17 1620  NA 131*  K 3.4*  CL 92*  CO2 33*  BUN 33*  CREATININE 1.44*  CALCIUM 9.1  GLUCOSE 136*   Lab Results  Component Value Date   CHOL 83 01/28/2017   HDL 25 (L) 01/28/2017   LDLCALC 46 01/28/2017   TRIG 58 01/28/2017   BNP (last 3 results)  Recent Labs  07/23/16 1200 12/21/16 1054 02/11/17 0019  BNP 182.4* 474.9* 839.7*    ProBNP (last 3 results) No results for input(s): PROBNP in the last 8760 hours.   Diagnostic Studies/Procedures   Dg Chest 2 View  Result Date: 02/11/2017 CLINICAL DATA:  Left-sided chest pain with shortness of breath EXAM: CHEST  2 VIEW COMPARISON:  01/31/2017 FINDINGS: Right-sided central venous catheter tip projects over the cavoatrial region. Left-sided single lead pacing device similar compared to prior. Surgical clips over the left breast. Mild cardiomegaly. Central vascular congestion and mild diffuse interstitial prominence suggesting mild edema. Possible patchy infiltrate inferiorly on the lateral view. No pleural effusion. Aortic atherosclerosis. No pneumothorax. IMPRESSION: 1. Cardiomegaly with mild central vascular congestion and diffuse interstitial prominence suggesting mild edema 2. Patchy opacity, likely left lung base may reflect a small infiltrate Electronically Signed   By: Donavan Foil M.D.   On: 02/11/2017 01:17  Discharge Medications   Allergies as of 02/12/2017      Reactions   Bee Venom Anaphylaxis   Aspirin Hives   Ciprofloxacin Hives   Codeine Nausea Only   "can take ONLY if she eats with med"   Naproxen Hives   Other Other (See Comments)   ANY TYPES OF METAL-CAUSES BLISTERS SKIN AND ITCHING   Sulfonamide Derivatives Hives   Xarelto [rivaroxaban] Hives   Atorvastatin Itching   Nsaids Itching, Rash   Tape Rash     Also allergic to metal   Tresiba Flextouch [insulin Degludec] Itching, Rash      Medication List    TAKE these medications   acetaminophen 650 MG CR tablet Commonly known as:  TYLENOL Take 1,300 mg by mouth every 8 (eight) hours as needed for pain.   ALPRAZolam 0.5 MG tablet Commonly known as:  XANAX Take 0.25 mg by mouth 4 (four) times daily as needed for anxiety.   amiodarone 200 MG tablet Commonly known as:  PACERONE Take 1 tablet (200 mg total) by mouth 2 (two) times daily. What changed:  how much to take  how to take this  when to take this  additional instructions   apixaban 5 MG Tabs tablet Commonly known as:  ELIQUIS Take 1 tablet (5 mg total) by mouth 2 (two) times daily.   baclofen 20 MG tablet Commonly known as:  LIORESAL Take 20 mg by mouth 2 (two) times daily as needed for muscle spasms.   busPIRone 30 MG tablet Commonly known as:  BUSPAR Take 30 mg by mouth 2 (two) times daily.   CALCIUM 600+D3 600-800 MG-UNIT Tabs Generic drug:  Calcium Carb-Cholecalciferol Take by mouth daily.   Calcium-Magnesium-Zinc Tabs Take 1 tablet by mouth daily after lunch.   digoxin 0.125 MG tablet Commonly known as:  LANOXIN Take 1 tablet (0.125 mg total) by mouth every other day.   docusate sodium 100 MG capsule Commonly known as:  COLACE Take 3 capsules (300 mg total) by mouth at bedtime.   doxycycline 100 MG tablet Commonly known as:  VIBRA-TABS Take 1 tablet (100 mg total) by mouth every 12 (twelve) hours.   DULoxetine 30 MG capsule Commonly known as:  CYMBALTA Take 30 mg by mouth daily.   Fish Oil 1000 MG Caps Take 2 capsules by mouth daily.   fluconazole 200 MG tablet Commonly known as:  DIFLUCAN Take 1 tablet (200 mg total) by mouth daily. Start taking on:  5/32/9924   Garlic 268 MG Caps Take 500 mg by mouth daily after lunch.   GLUCOSAMINE CHONDROITIN JOINT PO Take 1 tablet by mouth daily.   HUMALOG KWIKPEN 100 UNIT/ML KiwkPen Generic  drug:  insulin lispro Inject 14-24 Units into the skin 3 (three) times daily. On average per patient 24 unit with breakfast, 16 units with lunch, & 14-16 units with supper   HUMALOG MIX 75/25 KWIKPEN (75-25) 100 UNIT/ML Kwikpen Generic drug:  Insulin Lispro Prot & Lispro Inject 60 Units into the skin 2 (two) times daily.   hydrOXYzine 50 MG tablet Commonly known as:  ATARAX/VISTARIL Take 50 mg by mouth every 6 (six) hours as needed for itching ((primarily taken at bedtime)).   IRON PO Take 24 mg by mouth daily.   levothyroxine 75 MCG tablet Commonly known as:  SYNTHROID, LEVOTHROID Take 75 mcg by mouth daily before breakfast.   Melatonin 1 MG Tabs Take 0.5 mg by mouth at bedtime.   metolazone 2.5 MG tablet Commonly known as:  ZAROXOLYN Take 1 tablet (2.5 mg total) by mouth as needed (for weight gain >3 lbs).   milrinone 20 MG/100 ML Soln infusion Commonly known as:  PRIMACOR Inject 35.2875 mcg/min into the vein continuous.   mometasone 50 MCG/ACT nasal spray Commonly known as:  NASONEX Place 1 spray into both nostrils daily. allergies   nitroGLYCERIN 0.4 MG SL tablet Commonly known as:  NITROSTAT Place 1 tablet (0.4 mg total) under the tongue every 5 (five) minutes as needed. For chest pain   pantoprazole 40 MG tablet Commonly known as:  PROTONIX Take 40 mg by mouth 2 (two) times daily.   polyethylene glycol powder powder Commonly known as:  GLYCOLAX/MIRALAX Mix 17 grams in 8 oz of water twice daily   potassium chloride SA 20 MEQ tablet Commonly known as:  K-DUR,KLOR-CON Take 3 tablets (60 mEq total) by mouth 2 (two) times daily. What changed:  how much to take  how to take this  when to take this  additional instructions   PROVENTIL HFA 108 (90 Base) MCG/ACT inhaler Generic drug:  albuterol Inhale 2 puffs into the lungs every 6 (six) hours as needed. For shortness of breath   pyridOXINE 100 MG tablet Commonly known as:  VITAMIN B-6 Take 100 mg by mouth  daily after lunch.   rOPINIRole 1 MG tablet Commonly known as:  REQUIP Take 1 mg by mouth at bedtime and may repeat dose one time if needed. IF RESTLESS LEGS PERSIST PATIENT MAY REPEAT ADDITIONAL DOSE   rosuvastatin 10 MG tablet Commonly known as:  CRESTOR Take 10 mg by mouth at bedtime.   Simethicone Extra Strength 125 MG Caps Take 1 tablet by mouth daily as needed (for bloating). Reported on 02/23/2016   spironolactone 25 MG tablet Commonly known as:  ALDACTONE Take 1 tablet (25 mg total) by mouth 2 (two) times daily.   SUMAtriptan 100 MG tablet Commonly known as:  IMITREX Take 100 mg by mouth daily as needed for migraine.   SYSTANE BALANCE 0.6 % Soln Generic drug:  Propylene Glycol Place 1 drop into both eyes 3 (three) times daily.   tiotropium 18 MCG inhalation capsule Commonly known as:  SPIRIVA Place 18 mcg into inhaler and inhale daily.   torsemide 20 MG tablet Commonly known as:  DEMADEX Take 2 tablets (40 mg total) by mouth 2 (two) times daily.   traMADol 50 MG tablet Commonly known as:  ULTRAM Take 1 tablet by mouth daily as needed for moderate pain.   vitamin B-12 1000 MCG tablet Commonly known as:  CYANOCOBALAMIN Take 1,000 mcg by mouth daily after lunch.   vitamin C 500 MG tablet Commonly known as:  ASCORBIC ACID Take 500 mg by mouth daily after lunch.   Vitamin D 2000 units Caps Take 1 capsule by mouth daily.   vitamin E 400 UNIT capsule Take 400 Units by mouth daily after lunch.            Durable Medical Equipment        Start     Ordered   02/12/17 1522  Heart failure home health orders  (Heart failure home health orders / Face to face)  Once    Comments:  Heart Failure Follow-up Care:  Verify follow-up appointments per Patient Discharge Instructions. Confirm transportation arranged. Reconcile home medications with discharge medication list. Remove discontinued medications from use. Assist patient/caregiver to manage medications using  pill box. Reinforce low sodium food selection Assessments: Vital signs and oxygen saturation at each visit.  Assess home environment for safety concerns, caregiver support and availability of low-sodium foods. Consult Education officer, museum, PT/OT, Dietitian, and CNA based on assessments. Perform comprehensive cardiopulmonary assessment. Notify MD for any change in condition or weight gain of 3 pounds in one day or 5 pounds in one week with symptoms. Daily Weights and Symptom Monitoring: Ensure patient has access to scales. Teach patient/caregiver to weigh daily before breakfast and after voiding using same scale and record.    Teach patient/caregiver to track weight and symptoms and when to notify Provider. Activity: Develop individualized activity plan with patient/caregiver.  Resume milrinone 0.375 mcg/kg/min x 12 months per [facility].  Question Answer Comment  Heart Failure Follow-up Care [facility] ([facility] at [phone]   Obtain the following labs Basic Metabolic Panel   Lab frequency Other see comments   Fax lab results to AHF Clinic at [phone]   Diet Low Sodium Heart Healthy   Fluid restrictions: 2000 mL Fluid      02/12/17 1522      Disposition   The patient will be discharged in stable condition to home. Discharge Instructions    (HEART FAILURE PATIENTS) Call MD:  Anytime you have any of the following symptoms: 1) 3 pound weight gain in 24 hours or 5 pounds in 1 week 2) shortness of breath, with or without a dry hacking cough 3) swelling in the hands, feet or stomach 4) if you have to sleep on extra pillows at night in order to breathe.    Complete by:  As directed    Diet - low sodium heart healthy    Complete by:  As directed    Increase activity slowly    Complete by:  As directed      Follow-up Information    [facility] Follow up on 02/17/2017.   Specialty:  Cardiology Why:  for labwork. Code for parking is  7000. Contact information: [address] [id number] [place] [phone]       Darrick Grinder D, NP Follow up on 02/20/2017.   Specialty:  Cardiology Why:  at 1130 am for post hospital follow up. Please bring all of your medications. The code for parking is 7000. (Dr. Yue Glasheen's office) Contact information: [address] [place] [address] [phone]             Duration of Discharge Encounter: Greater than 35 minutes   Signed, Annamaria Helling 02/12/2017, 5:08 PM   Patient seen and examined with the above-signed Advanced Practice Provider and/or Housestaff. I personally reviewed laboratory data, imaging studies and relevant notes. I independently examined the patient and formulated the important aspects of the plan. I have edited the note to reflect any of my changes or salient points. I have personally discussed the plan with the patient and/or family.  She is improved today. HF stabilized with IV milrinone. Back in NSR. Will treat wounds with doxy. Continue LVAD w/u. Will d/c home with close HF f/u  Glori Bickers, MD  7:56 PM

## 2017-02-12 NOTE — Evaluation (Signed)
Physical Therapy Evaluation Patient Details Name: [name] MRN: [id number] DOB: [birth year] Today's Date: 02/12/2017   History of Present Illness  61 year old with a history of R 2005/2008 and L 2017  breast cancer, HTN, DM, IBS, hyperlipidemia, chronic systolic heart failure, NICM chemo induced cardiomyopathy, St Jude ICD, and GI bleed S/B AVMs. Admitted 02/10/17 with Hyperkalemia and DOE  Clinical Impression  Orders received for PT evaluation. Patient demonstrates modest deficits in functional mobility and activity tolerance. Educated patient on energy conservation, safety with mobility, and activity expectations. Patient very appreciative and receptive. Patient could benefit from HHPT assessment for home energy conservation and activity program. Patient states that she may be undergoing possible LVAD implantation and would benefit from being in good physical condition prior to procedure.     Follow Up Recommendations Home health PT (could benefit from HHPT for energy conservation )    Equipment Recommendations  None recommended by PT    Recommendations for Other Services       Precautions / Restrictions        Mobility  Bed Mobility Overal bed mobility: Modified Independent             General bed mobility comments: increased time and effort  Transfers Overall transfer level: Modified independent Equipment used: 4-wheeled walker             General transfer comment: increased time and effort  Ambulation/Gait Ambulation/Gait assistance: Modified independent (Device/Increase time) Ambulation Distance (Feet): 120 Feet Assistive device: 4-wheeled walker Gait Pattern/deviations: Step-through pattern;Trunk flexed;Decreased stride length Gait velocity: decreased Gait velocity interpretation: Below normal speed for age/gender General Gait Details: slow and steady with gait, DOE 3/4 with increased activity. Educated on Development worker, community [place] (Stroke Patients Only)       Balance Overall balance assessment: Modified Independent                                           Pertinent Vitals/Pain Pain Assessment: No/denies pain    Home Living Family/patient expects to be discharged to:: Private residence Living Arrangements: Alone Available Help at Discharge: Family Type of Home: Mobile home Home Access: Stairs to enter Entrance Stairs-Rails: None Entrance Stairs-Number of Steps: 4 Home Layout: One level Home Equipment: Environmental consultant - 2 wheels;Walker - 4 wheels;Cane - single point;Bedside commode      Prior Function Level of Independence: Independent         Comments: ocassional use of 4 wheeled rollator in community     Hand Dominance   Dominant Hand: Right    Extremity/Trunk Assessment   Upper Extremity Assessment Upper Extremity Assessment: Generalized weakness    Lower Extremity Assessment Lower Extremity Assessment: Generalized weakness       Communication   Communication: No difficulties  Cognition Arousal/Alertness: Awake/alert Behavior During Therapy: WFL for tasks assessed/performed Overall Cognitive Status: Within Functional Limits for tasks assessed                                        General Comments      Exercises     Assessment/Plan    PT Assessment All further PT needs can be met in the next  venue of care  PT Problem List Decreased activity tolerance;Decreased mobility;Cardiopulmonary status limiting activity       PT Treatment Interventions      PT Goals (Current goals can be found in the Care Plan section)  Acute Rehab PT Goals PT Goal Formulation: All assessment and education complete, DC therapy    Frequency     Barriers to discharge        Co-evaluation               AM-PAC PT "6 Clicks" Daily Activity  Outcome Measure Difficulty turning over in bed (including adjusting  bedclothes, sheets and blankets)?: None Difficulty moving from lying on back to sitting on the side of the bed? : None Difficulty sitting down on and standing up from a chair with arms (e.g., wheelchair, bedside commode, etc,.)?: None Help needed moving to and from a bed to chair (including a wheelchair)?: None Help needed walking in hospital room?: A Little Help needed climbing 3-5 steps with a railing? : A Little 6 Click Score: 22    End of Session   Activity Tolerance: Patient tolerated treatment well Patient left: in bed;with call bell/phone within reach Nurse Communication: Mobility status PT Visit Diagnosis: Difficulty in walking, not elsewhere classified (R26.2)    Time: 2446-9507 PT Time Calculation (min) (ACUTE ONLY): 17 min   Charges:         PT G Codes:        Alben Deeds, PT DPT [facility] [place] 02/12/2017, 3:19 PM

## 2017-02-13 ENCOUNTER — Encounter: Payer: Self-pay | Admitting: *Deleted

## 2017-02-13 ENCOUNTER — Ambulatory Visit: Payer: Self-pay | Admitting: *Deleted

## 2017-02-13 ENCOUNTER — Telehealth: Payer: Self-pay

## 2017-02-13 ENCOUNTER — Other Ambulatory Visit: Payer: Self-pay | Admitting: *Deleted

## 2017-02-13 ENCOUNTER — Encounter (HOSPITAL_COMMUNITY): Payer: Medicare Other | Admitting: Internal Medicine

## 2017-02-13 DIAGNOSIS — Z451 Encounter for adjustment and management of infusion pump: Secondary | ICD-10-CM | POA: Diagnosis not present

## 2017-02-13 DIAGNOSIS — Z853 Personal history of malignant neoplasm of breast: Secondary | ICD-10-CM | POA: Diagnosis not present

## 2017-02-13 DIAGNOSIS — Z87891 Personal history of nicotine dependence: Secondary | ICD-10-CM | POA: Diagnosis not present

## 2017-02-13 DIAGNOSIS — Z7901 Long term (current) use of anticoagulants: Secondary | ICD-10-CM | POA: Diagnosis not present

## 2017-02-13 DIAGNOSIS — Z794 Long term (current) use of insulin: Secondary | ICD-10-CM | POA: Diagnosis not present

## 2017-02-13 DIAGNOSIS — I427 Cardiomyopathy due to drug and external agent: Secondary | ICD-10-CM | POA: Diagnosis not present

## 2017-02-13 DIAGNOSIS — Z79899 Other long term (current) drug therapy: Secondary | ICD-10-CM | POA: Diagnosis not present

## 2017-02-13 DIAGNOSIS — E119 Type 2 diabetes mellitus without complications: Secondary | ICD-10-CM | POA: Diagnosis not present

## 2017-02-13 DIAGNOSIS — G4733 Obstructive sleep apnea (adult) (pediatric): Secondary | ICD-10-CM | POA: Diagnosis not present

## 2017-02-13 DIAGNOSIS — I481 Persistent atrial fibrillation: Secondary | ICD-10-CM | POA: Diagnosis not present

## 2017-02-13 DIAGNOSIS — Z951 Presence of aortocoronary bypass graft: Secondary | ICD-10-CM | POA: Diagnosis not present

## 2017-02-13 DIAGNOSIS — Z5181 Encounter for therapeutic drug level monitoring: Secondary | ICD-10-CM | POA: Diagnosis not present

## 2017-02-13 DIAGNOSIS — I5023 Acute on chronic systolic (congestive) heart failure: Secondary | ICD-10-CM | POA: Diagnosis not present

## 2017-02-13 DIAGNOSIS — I110 Hypertensive heart disease with heart failure: Secondary | ICD-10-CM | POA: Diagnosis not present

## 2017-02-13 DIAGNOSIS — T451X5D Adverse effect of antineoplastic and immunosuppressive drugs, subsequent encounter: Secondary | ICD-10-CM | POA: Diagnosis not present

## 2017-02-13 DIAGNOSIS — Z95810 Presence of automatic (implantable) cardiac defibrillator: Secondary | ICD-10-CM | POA: Diagnosis not present

## 2017-02-13 DIAGNOSIS — J449 Chronic obstructive pulmonary disease, unspecified: Secondary | ICD-10-CM | POA: Diagnosis not present

## 2017-02-13 NOTE — Telephone Encounter (Signed)
Pt called in to confirm her appt next week.

## 2017-02-13 NOTE — Consult Note (Signed)
   [facility] CM Inpatient Consult   02/13/2017  [name] Apr 01, 1956 [id number]    Late entry for 02/12/17   Spoke with [name] at bedside prior to hospitalization. She is active with [place] [facility] program and is agreeable to ongoing [place] [facility] follow up. Ms. Riepe denied need for [place] referral at this time.  Inpatient RNCM aware [place] [facility] is following.  Spoke with [place] of pending discharge.    Marthenia Rolling, MSN-Ed, RN,BSN [facility] Liaison [phone]

## 2017-02-13 NOTE — Patient Outreach (Signed)
[name]  02/13/2017  [name] 03/09/1956 [id number]   Patient discharged from [facility] 02/12/17 Dx: Heart Failure  Transition of care call   Spoke with patient reports she is feeling 100% better on today, she is glad to be back at her home slept with her CPAP on last night.   Heart Failure Patient reports today's weight is 201.8, she keeps a record of reading , blood pressure 117/86 heart rate 98, O2 98 %. Patient denies increase in swelling, reports she has some shortness of breath with activity around home, state she has learned to rest because she know her heart function is only around 15 %.  Patient reports she is taking medications as prescribed, she took lasix 40 mg this morning as instructed by Wynonia Lawman PA until she gets he Torsemide prescription filled on today, because she did not get home to late last night,  her daughter to pick up from pharmacy today.  Patient uses a pill organizer to manage medications.  Patient able to state symptoms of worsening Heart failure, weight gain of 2 pounds in a day and 5 in a week. Patient is on home milrinone and [facility] that has visited on today.  Patient discussed future plans for LVAD, , follow up with Oncologist with PET scan to follow up left breast cancer.     Diabetes Patient discussed she had episode of hypoglycemia on last night blood sugar level down to 66, she treated it with a snack and repeat blood sugar up to 102 by bedtime.Morning blood sugar  110, reports she did not take morning insulin  Checked blood sugar before lunch  It was 115. She will continue to monitor blood sugars and notify PCP of continued episodes of lower blood sugars .  Left axilla area boil/rash Patient reports keeping area clean and keep a dry gauze at site, taking antibiotics as prescribed. Patient report she has made an appointment with Dr.Ingram, surgeon first available appointment 9/27,  when she has asked to be added to list if patient cancelled.   Patient daughter will provide transportation to patient appointments, she has Heart failure , lab visit on 7/16 and clinic visit on 7/19.    Outpatient Encounter Prescriptions as of 02/13/2017  Medication Sig  . acetaminophen (TYLENOL) 650 MG CR tablet Take 1,300 mg by mouth every 8 (eight) hours as needed for pain.  Marland Kitchen albuterol (PROVENTIL HFA) 108 (90 BASE) MCG/ACT inhaler Inhale 2 puffs into the lungs every 6 (six) hours as needed. For shortness of breath  . ALPRAZolam (XANAX) 0.5 MG tablet Take 0.25 mg by mouth 4 (four) times daily as needed for anxiety.   Marland Kitchen amiodarone (PACERONE) 200 MG tablet Take 1 tablet (200 mg total) by mouth 2 (two) times daily.  Marland Kitchen apixaban (ELIQUIS) 5 MG TABS tablet Take 1 tablet (5 mg total) by mouth 2 (two) times daily.  . baclofen (LIORESAL) 20 MG tablet Take 20 mg by mouth 2 (two) times daily as needed for muscle spasms.  . busPIRone (BUSPAR) 30 MG tablet Take 30 mg by mouth 2 (two) times daily.   . Calcium Carb-Cholecalciferol (CALCIUM 600+D3) 600-800 MG-UNIT TABS Take by mouth daily.  . Cholecalciferol (VITAMIN D) 2000 units CAPS Take 1 capsule by mouth daily.  . digoxin (LANOXIN) 0.125 MG tablet Take 1 tablet (0.125 mg total) by mouth every other day.  . docusate sodium (COLACE) 100 MG capsule Take 3 capsules (300 mg total) by  mouth at bedtime.  Marland Kitchen doxycycline (VIBRA-TABS) 100 MG tablet Take 1 tablet (100 mg total) by mouth every 12 (twelve) hours.  . DULoxetine (CYMBALTA) 30 MG capsule Take 30 mg by mouth daily.  . fluconazole (DIFLUCAN) 200 MG tablet Take 1 tablet (200 mg total) by mouth daily.  . Garlic 465 MG CAPS Take 500 mg by mouth daily after lunch.   . Glucos-Chondroit-Hyaluron-MSM (GLUCOSAMINE CHONDROITIN JOINT PO) Take 1 tablet by mouth daily.  Marland Kitchen HUMALOG KWIKPEN 100 UNIT/ML KiwkPen Inject 14-24 Units into the skin 3 (three) times daily. On average per patient 24 unit with breakfast, 16  units with lunch, & 14-16 units with supper  . HUMALOG MIX 75/25 KWIKPEN (75-25) 100 UNIT/ML Kwikpen Inject 60 Units into the skin 2 (two) times daily.   . hydrOXYzine (ATARAX/VISTARIL) 50 MG tablet Take 50 mg by mouth every 6 (six) hours as needed for itching ((primarily taken at bedtime)).   . IRON PO Take 24 mg by mouth daily.  Marland Kitchen levothyroxine (SYNTHROID, LEVOTHROID) 75 MCG tablet Take 75 mcg by mouth daily before breakfast.   . Melatonin 1 MG TABS Take 0.5 mg by mouth at bedtime.  . metolazone (ZAROXOLYN) 2.5 MG tablet Take 1 tablet (2.5 mg total) by mouth as needed (for weight gain >3 lbs).  . milrinone (PRIMACOR) 20 MG/100 ML SOLN infusion Inject 35.2875 mcg/min into the vein continuous.  . mometasone (NASONEX) 50 MCG/ACT nasal spray Place 1 spray into both nostrils daily. allergies  . Multiple Minerals (CALCIUM-MAGNESIUM-ZINC) TABS Take 1 tablet by mouth daily after lunch.   . nitroGLYCERIN (NITROSTAT) 0.4 MG SL tablet Place 1 tablet (0.4 mg total) under the tongue every 5 (five) minutes as needed. For chest pain  . Omega-3 Fatty Acids (FISH OIL) 1000 MG CAPS Take 2 capsules by mouth daily.  . pantoprazole (PROTONIX) 40 MG tablet Take 40 mg by mouth 2 (two) times daily.    . polyethylene glycol powder (GLYCOLAX/MIRALAX) powder Mix 17 grams in 8 oz of water twice daily  . potassium chloride SA (K-DUR,KLOR-CON) 20 MEQ tablet Take 3 tablets (60 mEq total) by mouth 2 (two) times daily.  Marland Kitchen Propylene Glycol (SYSTANE BALANCE) 0.6 % SOLN Place 1 drop into both eyes 3 (three) times daily.  Marland Kitchen pyridOXINE (VITAMIN B-6) 100 MG tablet Take 100 mg by mouth daily after lunch.   Marland Kitchen rOPINIRole (REQUIP) 1 MG tablet Take 1 mg by mouth at bedtime and may repeat dose one time if needed. IF RESTLESS LEGS PERSIST PATIENT MAY REPEAT ADDITIONAL DOSE  . rosuvastatin (CRESTOR) 10 MG tablet Take 10 mg by mouth at bedtime.  . Simethicone Extra Strength 125 MG CAPS Take 1 tablet by mouth daily as needed (for bloating).  Reported on 02/23/2016  . spironolactone (ALDACTONE) 25 MG tablet Take 1 tablet (25 mg total) by mouth 2 (two) times daily.  . SUMAtriptan (IMITREX) 100 MG tablet Take 100 mg by mouth daily as needed for migraine.   . tiotropium (SPIRIVA) 18 MCG inhalation capsule Place 18 mcg into inhaler and inhale daily.    Marland Kitchen torsemide (DEMADEX) 20 MG tablet Take 2 tablets (40 mg total) by mouth 2 (two) times daily.  . traMADol (ULTRAM) 50 MG tablet Take 1 tablet by mouth daily as needed for moderate pain.   . vitamin B-12 (CYANOCOBALAMIN) 1000 MCG tablet Take 1,000 mcg by mouth daily after lunch.   . vitamin C (ASCORBIC ACID) 500 MG tablet Take 500 mg by mouth daily after lunch.   Marland Kitchen  vitamin E 400 UNIT capsule Take 400 Units by mouth daily after lunch.    No facility-administered encounter medications on file as of 02/13/2017.   Patient was recently discharged from hospital and all medications have been reviewed.  Plan Patient will remain active with transition of care program, she will receive weekly outreaches, next call in a week.  Patient to notify MD of worsening of heart failure with symptoms of increased shortness of breath, weight gain of 2 pounds in a day or 5 in a week or symptoms of concern,. Will send PCP initial transition of care note.  Will place pharmacy consult for medication review patient on greater than 25 medications.   [facility] CM Care Plan Problem One     Most Recent Value  Care Plan Problem One  Patient with recent hospitalization related to Heart failure   Role Documenting the Problem One  Care [name] [place] for Problem One  Active  [facility]N Long Term Goal   Patient will not experience a hospital admission in the next 60 days   [facility] Long Term Goal Start Date  02/13/17  Interventions for Problem One Long Term Goal  Advised patient regarding following MD instructions regarding medications , notifying MD sooner regarding new symptoms/concern to arrange office visit   [facility] CM  Short Term Goal #1   Patient will report continuing to weigh daily and keep a record in the next 30 day s  [facility] CM Short Term Goal #1 Start Date  02/13/17 [goal date restart ]  Interventions for Short Term Goal #1  Advised regarding keeping track of weights, best time of day to weigh early morning after voding   [facility] CM Short Term Goal #2   Patient will attend all medical appointment in the next 30 days   [facility] CM Short Term Goal #2 Start Date  02/13/17 Barrie Folk date restarted ]  Interventions for Short Term Goal #2  Explained importance of keeping all MD visits, take record of weights, discussed transporation concerns/optioms   [facility] CM Short Term Goal #3  Patient will continue to be able to state worsening symptoms of heart failure and action plan to follow in the next 30 days   [facility] CM Short Term Goal #3 Start Date  02/13/17  Interventions for Short Tern Goal #3  Advised regarding symptoms of worsening heart failure and when to call MD , weight gain of 2 pounds in a day and 5 in a week, increase shortness of breath, swelling, hacking cough , increased fatigue .      Joylene Draft, RN, [place] [name] [phone]- Mobile [phone]- Toll Free Main Office

## 2017-02-14 LAB — AEROBIC CULTURE  (SUPERFICIAL SPECIMEN): SPECIAL REQUESTS: NORMAL

## 2017-02-14 LAB — AEROBIC CULTURE W GRAM STAIN (SUPERFICIAL SPECIMEN)

## 2017-02-14 NOTE — Addendum Note (Signed)
Addended by: Joylene Draft A on: 02/14/2017 08:40 AM   Modules accepted: Orders

## 2017-02-15 ENCOUNTER — Telehealth: Payer: Self-pay | Admitting: Physician Assistant

## 2017-02-15 DIAGNOSIS — G4733 Obstructive sleep apnea (adult) (pediatric): Secondary | ICD-10-CM | POA: Diagnosis not present

## 2017-02-15 NOTE — Telephone Encounter (Signed)
Paged by answering service. Patient had gained 1.5lb overnight and has mild dyspnea this morning. She took 40mg  AM dose of Torsemide prior to call. Advised to take extra 10mg  in afternoon if persistent dyspnea. She will call us weight again and no improvement of dyspnea.

## 2017-02-16 ENCOUNTER — Telehealth: Payer: Self-pay | Admitting: Internal Medicine

## 2017-02-16 ENCOUNTER — Emergency Department (HOSPITAL_COMMUNITY)
Admission: EM | Admit: 2017-02-16 | Discharge: 2017-02-17 | Disposition: A | Discharge status: Home / Self Care | Payer: Medicare Other | Attending: Emergency Medicine | Admitting: Emergency Medicine

## 2017-02-16 ENCOUNTER — Encounter: Payer: Self-pay | Admitting: Internal Medicine

## 2017-02-16 ENCOUNTER — Encounter (HOSPITAL_COMMUNITY): Payer: Self-pay

## 2017-02-16 ENCOUNTER — Emergency Department (HOSPITAL_COMMUNITY): Payer: Medicare Other

## 2017-02-16 DIAGNOSIS — I2510 Atherosclerotic heart disease of native coronary artery without angina pectoris: Secondary | ICD-10-CM | POA: Diagnosis not present

## 2017-02-16 DIAGNOSIS — E109 Type 1 diabetes mellitus without complications: Secondary | ICD-10-CM | POA: Insufficient documentation

## 2017-02-16 DIAGNOSIS — J449 Chronic obstructive pulmonary disease, unspecified: Secondary | ICD-10-CM | POA: Diagnosis not present

## 2017-02-16 DIAGNOSIS — I5022 Chronic systolic (congestive) heart failure: Secondary | ICD-10-CM | POA: Diagnosis not present

## 2017-02-16 DIAGNOSIS — I428 Other cardiomyopathies: Secondary | ICD-10-CM | POA: Diagnosis not present

## 2017-02-16 DIAGNOSIS — Z95810 Presence of automatic (implantable) cardiac defibrillator: Secondary | ICD-10-CM | POA: Diagnosis not present

## 2017-02-16 DIAGNOSIS — R0602 Shortness of breath: Secondary | ICD-10-CM | POA: Insufficient documentation

## 2017-02-16 DIAGNOSIS — Z87891 Personal history of nicotine dependence: Secondary | ICD-10-CM | POA: Insufficient documentation

## 2017-02-16 DIAGNOSIS — I110 Hypertensive heart disease with heart failure: Secondary | ICD-10-CM | POA: Insufficient documentation

## 2017-02-16 DIAGNOSIS — Z79899 Other long term (current) drug therapy: Secondary | ICD-10-CM | POA: Insufficient documentation

## 2017-02-16 DIAGNOSIS — I480 Paroxysmal atrial fibrillation: Secondary | ICD-10-CM | POA: Diagnosis not present

## 2017-02-16 DIAGNOSIS — I252 Old myocardial infarction: Secondary | ICD-10-CM | POA: Insufficient documentation

## 2017-02-16 DIAGNOSIS — Z853 Personal history of malignant neoplasm of breast: Secondary | ICD-10-CM | POA: Insufficient documentation

## 2017-02-16 DIAGNOSIS — E782 Mixed hyperlipidemia: Secondary | ICD-10-CM | POA: Insufficient documentation

## 2017-02-16 DIAGNOSIS — R000 Tachycardia, unspecified: Secondary | ICD-10-CM | POA: Diagnosis not present

## 2017-02-16 LAB — CBC WITH DIFFERENTIAL/PLATELET
BASOS ABS: 0 10*3/uL (ref 0.0–0.1)
BASOS PCT: 1 %
EOS PCT: 1 %
Eosinophils Absolute: 0.1 10*3/uL (ref 0.0–0.7)
HCT: 42 % (ref 36.0–46.0)
Hemoglobin: 13.8 g/dL (ref 12.0–15.0)
Lymphocytes Relative: 15 %
Lymphs Abs: 1.3 10*3/uL (ref 0.7–4.0)
MCH: 30.1 pg (ref 26.0–34.0)
MCHC: 32.9 g/dL (ref 30.0–36.0)
MCV: 91.7 fL (ref 78.0–100.0)
MONO ABS: 0.7 10*3/uL (ref 0.1–1.0)
Monocytes Relative: 9 %
Neutro Abs: 6.4 10*3/uL (ref 1.7–7.7)
Neutrophils Relative %: 74 %
PLATELETS: 307 10*3/uL (ref 150–400)
RBC: 4.58 MIL/uL (ref 3.87–5.11)
RDW: 17.1 % — ABNORMAL HIGH (ref 11.5–15.5)
WBC: 8.5 10*3/uL (ref 4.0–10.5)

## 2017-02-16 LAB — COMPREHENSIVE METABOLIC PANEL
ALK PHOS: 89 U/L (ref 38–126)
ALT: 39 U/L (ref 14–54)
ANION GAP: 8 (ref 5–15)
AST: 30 U/L (ref 15–41)
Albumin: 3.4 g/dL — ABNORMAL LOW (ref 3.5–5.0)
BUN: 36 mg/dL — ABNORMAL HIGH (ref 6–20)
CALCIUM: 10.1 mg/dL (ref 8.9–10.3)
CO2: 28 mmol/L (ref 22–32)
Chloride: 98 mmol/L — ABNORMAL LOW (ref 101–111)
Creatinine, Ser: 1.4 mg/dL — ABNORMAL HIGH (ref 0.44–1.00)
GFR calc non Af Amer: 40 mL/min — ABNORMAL LOW (ref >60)
GFR, EST AFRICAN AMERICAN: 46 mL/min — AB (ref >60)
Glucose, Bld: 118 mg/dL — ABNORMAL HIGH (ref 65–99)
POTASSIUM: 3.4 mmol/L — AB (ref 3.5–5.1)
Sodium: 134 mmol/L — ABNORMAL LOW (ref 135–145)
TOTAL PROTEIN: 7.6 g/dL (ref 6.5–8.1)
Total Bilirubin: 1.4 mg/dL — ABNORMAL HIGH (ref 0.3–1.2)

## 2017-02-16 LAB — BRAIN NATRIURETIC PEPTIDE: B NATRIURETIC PEPTIDE 5: 658.4 pg/mL — AB (ref 0.0–100.0)

## 2017-02-16 LAB — MAGNESIUM: Magnesium: 1.8 mg/dL (ref 1.7–2.4)

## 2017-02-16 LAB — CBG MONITORING, ED
GLUCOSE-CAPILLARY: 99 mg/dL (ref 65–99)
Glucose-Capillary: 133 mg/dL — ABNORMAL HIGH (ref 65–99)

## 2017-02-16 LAB — TROPONIN I: TROPONIN I: 0.04 ng/mL — AB (ref <0.03)

## 2017-02-16 LAB — PROTIME-INR
INR: 1.93
PROTHROMBIN TIME: 22.3 s — AB (ref 11.4–15.2)

## 2017-02-16 MED ORDER — FUROSEMIDE 10 MG/ML IJ SOLN
40.0000 mg | Freq: Once | INTRAMUSCULAR | Status: AC
  Administered 2017-02-17: 40 mg via INTRAVENOUS
  Filled 2017-02-16: qty 4

## 2017-02-16 MED ORDER — ASPIRIN 81 MG PO CHEW: 324.0000 mg | CHEWABLE_TABLET | Freq: Once | ORAL | Status: DC

## 2017-02-16 NOTE — ED Provider Notes (Signed)
[facility] DEPT Provider Note   CSN: [id number] Arrival date & time: 02/16/17  2155     History   Chief Complaint Chief Complaint  Patient presents with  . Shortness of Breath  . Nausea    HPI [name] is a 61 y.o. female.  HPI Patient, with a past medical history of CHF, AICD, breast cancer, COPD, hypertension presents to ED for sudden onset shortness of breath while laying down that began around 6 hours ago. She also reports 2 episodes of vomiting. She also has noticed more swelling in bilateral lower extremities. Of note, patient was discharged from hospital for CHF exacerbation and hyperkalemia 4 days ago. She reports compliance with all of her home medications.  She also was discharged prior to this admission 9 days ago for similar symptoms. Her hospital stay that time was approximately 2 weeks. She denies any chest pain, lightheadedness, abdominal pain, blood in stool, blood in vomit. She reports that this feels similar to her previous CHF exacerbations.   Past Medical History:  Diagnosis Date  . AICD (automatic cardioverter/defibrillator) present    st jude  . Anxiety   . 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 Asthma   . Axillary mass, left 10/17/2016  . Breast cancer ([place]) 2005, 2017  . CHF (congestive heart failure) ([place])   . COPD (chronic obstructive pulmonary disease) ([place])   . Depression    followed by a psychiatrist  . DYSLIPIDEMIA 05/01/2009  . Dyspnea   . Dysrhythmia    atrial fibrillation  . Family history of breast cancer   . Family history of colon cancer   . Family history of uterine cancer   . Furuncle 10/17/2016  . Gastroparesis   . GERD (gastroesophageal reflux disease)   . Headache   . History of breast cancer 2006   with return in 2008, s/p mastectomy, XRT and chemotherapy  . History of external beam radiation therapy 09/25/16-11/13/16   left breast treated to 50.4 Gy in 28 fractions, boost 12 Gy in 6 fractions  . Hypertension   .  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 IBS (irritable bowel syndrome)   . ICD (implantable cardiac defibrillator) in place 11/10/2012   ICD (11/2012)  . Malignant neoplasm of lower-inner quadrant of left female breast ([place]) 06/26/2016  . Myocardial infarction ([place])   . OBESITY 07/24/2007  . OBSTRUCTIVE SLEEP APNEA 07/24/2007   with CPAP compliance  . Osteoarthritis   . Persistent atrial fibrillation ([place]) 09/2014  . Personal history of radiation therapy    current  . Presence of permanent cardiac pacemaker   . RESTLESS LEG SYNDROME 07/24/2007  . SYSTOLIC HEART FAILURE, CHRONIC 01/05/2009   a. chemo induced cardiomyopathy b. cMRI (02/2009) EF 45% c. Myoview 05/2010: EF 59%, no ischemia d. RHC (01/2012) RA 20, RV 55/13/22, PA 56/34 (44), PCWP 28, Fick CO/CI: 3.1 / 1.5, PVR 5.3 WU, PA sat 42% & 48% e. ECHO (10/2012) EF 20-25%, diff HK, MV calcified annulus f. ECHO (03/2014) EF 30-35%, sev HK, inferior/inferoseptal walls, mild MR    Patient Active Problem List   Diagnosis Date Noted  . Acute on chronic heart failure ([place]) 02/11/2017  . Acute heart failure ([place]) 02/11/2017  . Hypokalemia 02/05/2017  . Atrial flutter ([place]) 02/05/2017  . Goals of care, counseling/discussion   . DNR (do not resuscitate) discussion   . Palliative care encounter   . Acute on chronic systolic (congestive) heart failure ([place]) 01/28/2017  . Acute on chronic systolic heart failure, NYHA class 3 ([place]) 01/28/2017  .  Axillary mass, left 10/17/2016  . Boil 10/17/2016  . Genetic testing 08/15/2016  . Family history of breast cancer   . Family history of uterine cancer   . Family history of colon cancer   . Malignant neoplasm of lower-inner quadrant of left female breast ([place]) 06/26/2016  . Small bowel polyp   . CHF (congestive heart failure) ([place]) 12/21/2015  . Acute blood loss anemia   . Atrial fibrillation ([place]) 12/11/2015  . Benign neoplasm of descending colon   . Upper GI bleeding   . Iron deficiency anemia due to  chronic blood loss   . Acute upper GI bleeding 11/14/2015  . Anemia 11/14/2015  . Absolute anemia   . Symptomatic anemia   . Bleeding gastrointestinal   . PAF (paroxysmal atrial fibrillation) ([place]) 09/22/2015  . Chest pain at rest 08/19/2015  . Chest pain 08/19/2015  . NSVT (nonsustained ventricular tachycardia) ([place]) 06/30/2015  . Breast cancer of lower-outer quadrant of right female breast ([place]) 07/22/2014  . CAD (coronary artery disease) 02/17/2014  . 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 Cardiogenic shock ([place]) 01/31/2012  . PALPITATIONS 09/20/2010  . Mixed hypercholesterolemia and hypertriglyceridemia 05/01/2009  . HYPERTENSION, BENIGN 01/05/2009  . FOOT PAIN 11/29/2008  . Sinus tachycardia 10/25/2008  . 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 Obesity 07/24/2007  . Obstructive sleep apnea 07/24/2007  . RESTLESS LEG SYNDROME 07/24/2007    Past Surgical History:  Procedure Laterality Date  . ABDOMINAL HYSTERECTOMY  1991  . BI-VENTRICULAR IMPLANTABLE CARDIOVERTER DEFIBRILLATOR N/A 11/10/2012   SJM Forify Assura single chamber ICD  . BREAST BIOPSY Left 08/2015   benign  . BREAST BIOPSY Left 06/2016   malignant  . BREAST LUMPECTOMY Left 07/24/2016   malignant  . BREAST LUMPECTOMY WITH RADIOACTIVE SEED AND SENTINEL LYMPH NODE BIOPSY Left 07/24/2016   Procedure: LEFT BREAST LUMPECTOMY WITH RADIOACTIVE SEED AND SENTINEL LYMPH NODE BIOPSY WITH BLUE DYE INJECTION;  Surgeon: Fanny Skates, MD;  Location: [facility];  Service: General;  Laterality: Left;  . CARDIAC CATHETERIZATION N/A 08/21/2015   Procedure: Right/Left Heart Cath and Coronary Angiography;  Surgeon: Jolaine Artist, MD;  Location: [facility] LAB;  Service: Cardiovascular;  Laterality: N/A;  . CARDIAC DEFIBRILLATOR PLACEMENT  11/10/2012   SJM Fortify Assura VR implanted by Dr Rayann Heman for primary prevention  . CARDIOVERSION N/A 02/06/2017   Procedure:  CARDIOVERSION;  Surgeon: Jolaine Artist, MD;  Location: [facility] ENDOSCOPY;  Service: Cardiovascular;  Laterality: N/A;  . CHOLECYSTECTOMY    . COLONOSCOPY N/A 11/17/2015   Procedure: COLONOSCOPY;  Surgeon: Jerene Bears, MD;  Location: [facility] ENDOSCOPY;  Service: Endoscopy;  Laterality: N/A;  . ENTEROSCOPY N/A 03/26/2016   Procedure: ENTEROSCOPY;  Surgeon: Manus Gunning, MD;  Location: [facility] ENDOSCOPY;  Service: Gastroenterology;  Laterality: N/A;  . ESOPHAGOGASTRODUODENOSCOPY N/A 11/16/2015   Procedure: ESOPHAGOGASTRODUODENOSCOPY (EGD);  Surgeon: Manus Gunning, MD;  Location: [facility];  Service: Gastroenterology;  Laterality: N/A;  . ESOPHAGOGASTRODUODENOSCOPY N/A 02/24/2016   Procedure: ESOPHAGOGASTRODUODENOSCOPY (EGD);  Surgeon: Milus Banister, MD;  Location: [facility];  Service: Endoscopy;  Laterality: N/A;  . ESOPHAGOGASTRODUODENOSCOPY N/A 03/26/2016   Procedure: ESOPHAGOGASTRODUODENOSCOPY (EGD);  Surgeon: Manus Gunning, MD;  Location: [facility] ENDOS[facility];  Service: Gastroenterology;  Laterality: N/A;  . EYE SURGERY     cataracts  . GIVENS CAPSULE STUDY N/A 12/11/2015   Procedure: GIVENS CAPSULE STUDY;  Surgeon: Manus Gunning, MD;  Location: [facility];  Service: Gastroenterology;  Laterality: N/A;  .  IR FLUORO GUIDE CV LINE RIGHT  02/04/2017  . IR US GUIDE VASC ACCESS RIGHT  02/04/2017  . KNEE CARTILAGE SURGERY    . MASTECTOMY     right  . RADIOLOGY WITH ANESTHESIA N/A 02/02/2017   Procedure: CARDIOVERSION;  Surgeon: Radiologist, Medication, MD;  Location: [facility];  Service: Radiology;  Laterality: N/A;  . RIGHT HEART CATH N/A 02/07/2017   Procedure: Right Heart Cath;  Surgeon: Jolaine Artist, MD;  Location: [facility] CV LAB;  Service: Cardiovascular;  Laterality: N/A;  . RIGHT/LEFT HEART CATH AND CORONARY ANGIOGRAPHY N/A 01/28/2017   Procedure: Right/Left Heart Cath and Coronary Angiography;  Surgeon: Jolaine Artist, MD;  Location: [facility] CV LAB;   Service: Cardiovascular;  Laterality: N/A;  . WRIST SURGERY Bilateral     OB History    No data available       Home Medications    Prior to Admission medications   Medication Sig Start Date End Date Taking? Authorizing Provider  acetaminophen (TYLENOL) 650 MG CR tablet Take 1,300 mg by mouth every 8 (eight) hours as needed for pain.   Yes [provider]  albuterol (PROVENTIL HFA) 108 (90 BASE) MCG/ACT inhaler Inhale 2 puffs into the lungs every 6 (six) hours as needed. For shortness of breath   Yes [provider]  ALPRAZolam (XANAX) 0.5 MG tablet Take 0.25 mg by mouth 4 (four) times daily as needed for anxiety.  11/06/15  Yes [provider]  amiodarone (PACERONE) 200 MG tablet Take 1 tablet (200 mg total) by mouth 2 (two) times daily. 02/12/17  Yes Shirley Friar, PA-C  apixaban (ELIQUIS) 5 MG TABS tablet Take 1 tablet (5 mg total) by mouth 2 (two) times daily. 07/11/16  Yes Bensimhon, Shaune Pascal, MD  baclofen (LIORESAL) 20 MG tablet Take 20 mg by mouth 2 (two) times daily as needed for muscle spasms.   Yes [provider]  busPIRone (BUSPAR) 30 MG tablet Take 30 mg by mouth 2 (two) times daily.    Yes [provider]  Calcium Carb-Cholecalciferol (CALCIUM 600+D3) 600-800 MG-UNIT TABS Take by mouth daily.   Yes [provider]  Cholecalciferol (VITAMIN D) 2000 units CAPS Take 1 capsule by mouth daily.   Yes [provider]  digoxin (LANOXIN) 0.125 MG tablet Take 1 tablet (0.125 mg total) by mouth every other day. 07/11/16  Yes Bensimhon, Shaune Pascal, MD  docusate sodium (COLACE) 100 MG capsule Take 3 capsules (300 mg total) by mouth at bedtime. 10/03/15  Yes Clegg, Amy D, NP  DULoxetine (CYMBALTA) 30 MG capsule Take 30 mg by mouth daily.   Yes Moon, Amy A, NP  Garlic 213 MG CAPS Take 500 mg by mouth daily after lunch.    Yes [provider]  Glucos-Chondroit-Hyaluron-MSM (GLUCOSAMINE CHONDROITIN JOINT PO) Take 1  tablet by mouth daily.   Yes [provider]  HUMALOG KWIKPEN 100 UNIT/ML KiwkPen Inject 14-24 Units into the skin 3 (three) times daily. On average per patient 24 unit with breakfast, 16 units with lunch, & 14-16 units with supper 10/30/15  Yes [provider]  HUMALOG MIX 75/25 KWIKPEN (75-25) 100 UNIT/ML Kwikpen Inject 60 Units into the skin 2 (two) times daily.  11/21/15  Yes [provider]  hydrOXYzine (ATARAX/VISTARIL) 50 MG tablet Take 50 mg by mouth every 6 (six) hours as needed for itching ((primarily taken at bedtime)).    Yes [provider]  IRON PO Take 24 mg by mouth daily.  Yes [provider]  levothyroxine (SYNTHROID, LEVOTHROID) 75 MCG tablet Take 75 mcg by mouth daily before breakfast.    Yes [provider]  Melatonin 1 MG TABS Take 0.5 mg by mouth at bedtime.   Yes [provider]  metolazone (ZAROXOLYN) 2.5 MG tablet Take 1 tablet (2.5 mg total) by mouth as needed (for weight gain >3 lbs). 07/30/16  Yes Bensimhon, Shaune Pascal, MD  milrinone (PRIMACOR) 20 MG/100 ML SOLN infusion Inject 35.2875 mcg/min into the vein continuous. 02/07/17  Yes Shirley Friar, PA-C  mometasone (NASONEX) 50 MCG/ACT nasal spray Place 1 spray into both nostrils daily. allergies   Yes [provider]  Multiple Minerals (CALCIUM-MAGNESIUM-ZINC) TABS Take 1 tablet by mouth daily after lunch.    Yes [provider]  nitroGLYCERIN (NITROSTAT) 0.4 MG SL tablet Place 1 tablet (0.4 mg total) under the tongue every 5 (five) minutes as needed. For chest pain 07/02/16  Yes Bensimhon, Shaune Pascal, MD  Omega-3 Fatty Acids (FISH OIL) 1000 MG CAPS Take 2 capsules by mouth daily.   Yes [provider]  pantoprazole (PROTONIX) 40 MG tablet Take 40 mg by mouth 2 (two) times daily.     Yes [provider]  polyethylene glycol powder (GLYCOLAX/MIRALAX) powder Mix 17 grams in 8 oz of water twice daily 06/04/16  Yes Armbruster,  Renelda Loma, MD  potassium chloride SA (K-DUR,KLOR-CON) 20 MEQ tablet Take 3 tablets (60 mEq total) by mouth 2 (two) times daily. 02/12/17  Yes Shirley Friar, PA-C  Propylene Glycol (SYSTANE BALANCE) 0.6 % SOLN Place 1 drop into both eyes 3 (three) times daily.   Yes [provider]  pyridOXINE (VITAMIN B-6) 100 MG tablet Take 100 mg by mouth daily after lunch.    Yes [provider]  rOPINIRole (REQUIP) 1 MG tablet Take 1 mg by mouth at bedtime and may repeat dose one time if needed. IF RESTLESS LEGS PERSIST PATIENT MAY REPEAT ADDITIONAL DOSE   Yes [provider]  rosuvastatin (CRESTOR) 10 MG tablet Take 10 mg by mouth at bedtime. 06/18/16  Yes [provider]  Simethicone Extra Strength 125 MG CAPS Take 1 tablet by mouth daily as needed (for bloating). Reported on 02/23/2016   Yes [provider]  spironolactone (ALDACTONE) 25 MG tablet Take 1 tablet (25 mg total) by mouth 2 (two) times daily. 02/07/17  Yes Shirley Friar, PA-C  SUMAtriptan (IMITREX) 100 MG tablet Take 100 mg by mouth daily as needed for migraine.  11/28/15  Yes [provider]  tiotropium (SPIRIVA) 18 MCG inhalation capsule Place 18 mcg into inhaler and inhale daily.     Yes [provider]  torsemide (DEMADEX) 20 MG tablet Take 2 tablets (40 mg total) by mouth 2 (two) times daily. 02/12/17  Yes Shirley Friar, PA-C  traMADol (ULTRAM) 50 MG tablet Take 1 tablet by mouth daily as needed for moderate pain.  10/15/16  Yes [provider]  vitamin B-12 (CYANOCOBALAMIN) 1000 MCG tablet Take 1,000 mcg by mouth daily after lunch.    Yes [provider]  vitamin C (ASCORBIC ACID) 500 MG tablet Take 500 mg by mouth daily after lunch.    Yes [provider]  vitamin E 400 UNIT capsule Take 400 Units by mouth daily after lunch.    Yes [provider]  doxycycline (VIBRA-TABS) 100 MG tablet Take 1 tablet (100 mg total) by  mouth every 12 (twelve) hours. 02/12/17   Shirley Friar,  PA-C  fluconazole (DIFLUCAN) 200 MG tablet Take 1 tablet (200 mg total) by mouth daily. 02/13/17   Shirley Friar, PA-C    Family History Family History  Problem Relation Age of Onset  . Diabetes Mellitus II Mother   . Lung cancer Mother   . Congestive Heart Failure Mother   . Coronary artery disease Father        CABG x 3  . Hypertension Father   . Diabetes Mellitus II Father   . Breast cancer Cousin 21       maternal first cousin  . Colon cancer Cousin 60       maternal first cousin  . Uterine cancer Cousin        dx in her 52s  . Colon cancer Maternal Aunt        dx in her late 51s  . Uterine cancer Maternal Aunt 66  . Lung cancer Maternal Aunt   . Uterine cancer Maternal Aunt 38  . Colon cancer Maternal Aunt 54  . Breast cancer Maternal Grandmother 3  . Uterine cancer Maternal Aunt 48  . Uterine cancer Cousin        dx in her 50s  . Uterine cancer Cousin 75    Social History Social History  Substance Use Topics  . Smoking status: Former Smoker    Packs/day: 1.50    Years: 33.00    Types: Cigarettes    Quit date: 04/12/2001  . Smokeless tobacco: Never Used     Comment: heaviest - 2.5- 3PPD x 3 years   . Alcohol use No     Allergies   Bee venom; Aspirin; Ciprofloxacin; Codeine; Naproxen; Other; Sulfonamide derivatives; Xarelto [rivaroxaban]; Atorvastatin; Nsaids; Tape; and Tresiba flextouch [insulin degludec]   Review of Systems Review of Systems  Constitutional: Negative for appetite change, chills and fever.  HENT: Negative for ear pain, rhinorrhea, sneezing and sore throat.   Eyes: Negative for photophobia and visual disturbance.  Respiratory: Positive for cough and shortness of breath. Negative for chest tightness and wheezing.   Cardiovascular: Positive for leg swelling. Negative for chest pain and palpitations.  Gastrointestinal: Positive for nausea and vomiting. Negative for  abdominal pain, blood in stool, constipation and diarrhea.  Genitourinary: Negative for dysuria, hematuria and urgency.  Musculoskeletal: Negative for myalgias.  Skin: Negative for rash.  Neurological: Negative for dizziness, weakness and light-headedness.     Physical Exam Updated Vital Signs BP 99/79   Pulse (!) 112   Temp 97.7 F (36.5 C) (Oral)   Resp (!) 23   Ht 5\' 5"  (1.651 m)   Wt 92.3 kg (203 lb 6.4 oz)   SpO2 97%   BMI 33.85 kg/m   Physical Exam  Constitutional: She appears well-developed and well-nourished. No distress.  HENT:  Head: Normocephalic and atraumatic.  Nose: Nose normal.  Eyes: Conjunctivae and EOM are normal. Left eye exhibits no discharge. No scleral icterus.  Neck: Normal range of motion. Neck supple.  Cardiovascular: Normal rate, regular rhythm, normal heart sounds and intact distal pulses.  Exam reveals no gallop and no friction rub.   No murmur heard. Pulmonary/Chest: Effort normal and breath sounds normal. No respiratory distress.  Diminished breath sounds bilaterally. Not in respiratory distress.  Abdominal: Soft. Bowel sounds are normal. She exhibits no distension. There is no tenderness. There is no guarding.  Musculoskeletal: Normal range of motion. She exhibits edema.  Bilateral lower extremity edema.  Neurological: She is alert. She exhibits normal muscle tone. Coordination  normal.  Skin: Skin is warm and dry. No rash noted.  Psychiatric: She has a normal mood and affect.  Nursing note and vitals reviewed.    ED Treatments / Results  Labs (all labs ordered are listed, but only abnormal results are displayed) Labs Reviewed  COMPREHENSIVE METABOLIC PANEL - Abnormal; Notable for the following:       Result Value   Sodium 134 (*)    Potassium 3.4 (*)    Chloride 98 (*)    Glucose, Bld 118 (*)    BUN 36 (*)    Creatinine, Ser 1.40 (*)    Albumin 3.4 (*)    Total Bilirubin 1.4 (*)    GFR calc non Af Amer 40 (*)    GFR calc Af Amer  46 (*)    All other components within normal limits  TROPONIN I - Abnormal; Notable for the following:    Troponin I 0.04 (*)    All other components within normal limits  BRAIN NATRIURETIC PEPTIDE - Abnormal; Notable for the following:    B Natriuretic Peptide 658.4 (*)    All other components within normal limits  CBC WITH DIFFERENTIAL/PLATELET - Abnormal; Notable for the following:    RDW 17.1 (*)    All other components within normal limits  PROTIME-INR - Abnormal; Notable for the following:    Prothrombin Time 22.3 (*)    All other components within normal limits  CBG MONITORING, ED - Abnormal; Notable for the following:    Glucose-Capillary 133 (*)    All other components within normal limits  MAGNESIUM  CBG MONITORING, ED    EKG  EKG Interpretation  Date/Time:  Sunday February 16 2017 22:22:51 EDT Ventricular Rate:  113 PR Interval:    QRS Duration: 144 QT Interval:  418 QTC Calculation: 574 R Axis:   -91 Text Interpretation:  VENTRICULAR PACED RHYTHM Abnormal ekg Confirmed by Carmin Muskrat [phone]) on 02/16/2017 10:51:24 PM       Radiology Dg Chest Portable 1 View  Result Date: 02/16/2017 CLINICAL DATA:  61 year old female with history of mid chest pressure and shortness of breath tonight. History of right-sided breast cancer diagnosed in 2005 treated with chemotherapy and radiation therapy. EXAM: PORTABLE CHEST 1 VIEW COMPARISON:  Chest x-ray 02/11/2017. FINDINGS: There is a right-sided internal jugular central venous catheter with tip terminating in the distal superior vena cava. Left-sided pacemaker/AICD with lead tip projecting over the expected location of the right ventricular apex. Lung volumes are normal. Diffuse peribronchial cuffing and interstitial prominence, similar to prior studies. No confluent consolidative airspace disease. No pleural effusions. Cephalization of the pulmonary vasculature. Mild cardiomegaly. Upper mediastinal contours are within normal  limits. Aortic atherosclerosis. IMPRESSION: 1. The appearance the chest again suggests mild congestive heart failure. The possibility of concurrent bronchitis could also be considered given the extensive peribronchial cuffing. 2. Aortic atherosclerosis. Electronically Signed   By: Vinnie Langton M.D.   On: 02/16/2017 22:36    Procedures Procedures (including critical care time)  Medications Ordered in ED Medications  furosemide (LASIX) injection 40 mg (40 mg Intravenous Given 02/17/17 0002)     Initial Impression / Assessment and Plan / ED Course  I have reviewed the triage vital signs and the nursing notes.  Pertinent labs & imaging results that were available during my care of the patient were reviewed by me and considered in my medical decision making (see chart for details).     Patient presents to the ED for shortness of  breath, increased lower extremity edema that she states began today. She reports compliance with her home torsemide. She reports similar symptoms in the past when she has an exacerbation of her CHF. She denies any chest pain, hemoptysis, unilateral leg swelling. She is currently on a milrinone drip. On physical exam her lungs sounds are diminished bilaterally. There is no wheezing or respiratory distress noted. She is satting at 95-98% on room air which she states is similar to her baseline. She has no oxygen use at home. There is symmetrical bilateral lower extremity edema but patient does not appear fluid overloaded. EKG appears similar to baseline. Chest x-ray showed evidence of CHF exacerbation. CBC unremarkable. CMP similar to baseline. Magnesium normal. BNP elevated at 658. This is decreased from her previous visit 5 days ago when it was in the 800s. Troponin elevated at 0.04. Spoke to cardiology fellow on-call who patient spoke to earlier this morning. Fellow came and saw patient stated that she looks a lot better than she sounded on the phone this morning when she  was sent to the ED. She agrees with our plan to give patient one dose of 40 mg IV Lasix here in the ED and advised her to follow up outpatient with her cardiologist. She states that she is scheduled for an appointment tomorrow. Troponin elevation could be due to a number of her chronic conditions. She does not have a clinical picture of ischemia. Patient appears stable for discharge at this time. Strict return precautions given.  Patient discussed with and seen by Dr. Vanita Panda, who agreed to plan.  Final Clinical Impressions(s) / ED Diagnoses   Final diagnoses:  Shortness of breath    New Prescriptions New Prescriptions   No medications on file     Delia Heady, Hershal Coria 02/17/17 0029    Carmin Muskrat, MD 02/20/17 [phone]

## 2017-02-16 NOTE — ED Notes (Signed)
Pt provided with regular ginger ale

## 2017-02-16 NOTE — ED Notes (Signed)
ED Provider at bedside. 

## 2017-02-16 NOTE — Telephone Encounter (Signed)
Paged by answering service regarding patient's heart failure. On third attempt, able to reach patient by phone. She endorses nausea and vomiting this evening along with worsening orthopnea and shortness of breath. She did take 1/2 of an extra torsemide dose yesterday, and her weight is stable from yesterday at 203.4 lbs but up from her discharge weight. She is able to keep minimal food down and cannot lie in bed due to SOB. She reports her HR is in the 110s, O2 sat is 98%. Her home milrinone is functioning.  Discussed options, with preference for her to come to ER to be evaluated and likely admitted. She is in agreement with this. She does not drive and will call her daughter to transport her or call an ambulance. Counseled on safe transport. She is approximately 40 min from [facility] and will come to ER, instructed to have them page me when she arrives.  Buford Dresser, overnight cardiology provider.

## 2017-02-16 NOTE — ED Notes (Signed)
Portable xray at bedside.

## 2017-02-16 NOTE — ED Triage Notes (Signed)
Pt from home with [facility] c/o sob and nausea since this afternoon. Pt denies any CP, vomit x2. Pt has hx of a fib ans CHF with milrinone drip. RBBB with ems, BP 110/62 HR 113 97% room air. Pt a.o, nad

## 2017-02-16 NOTE — ED Notes (Addendum)
Md notified that patient feels like sugar is dropping.  Glucose check and noted to be 99 at this time.  Permission given to give patient ginger ale.

## 2017-02-17 ENCOUNTER — Telehealth: Payer: Self-pay | Admitting: Cardiology

## 2017-02-17 ENCOUNTER — Inpatient Hospital Stay (HOSPITAL_COMMUNITY): Admit: 2017-02-17 | Payer: Medicare Other

## 2017-02-17 DIAGNOSIS — I428 Other cardiomyopathies: Secondary | ICD-10-CM | POA: Diagnosis not present

## 2017-02-17 DIAGNOSIS — G4733 Obstructive sleep apnea (adult) (pediatric): Secondary | ICD-10-CM | POA: Diagnosis not present

## 2017-02-17 DIAGNOSIS — R0602 Shortness of breath: Secondary | ICD-10-CM | POA: Diagnosis not present

## 2017-02-17 DIAGNOSIS — I5022 Chronic systolic (congestive) heart failure: Secondary | ICD-10-CM | POA: Diagnosis not present

## 2017-02-17 NOTE — Discharge Instructions (Signed)
Continue home medications as previously prescribed. Follow-up with cardiologist as soon as possible for further evaluation. Return to ED for worsening chest pain, increased shortness of breath, cough, fever, increased swelling.

## 2017-02-17 NOTE — Consult Note (Signed)
CARDIOLOGY CONSULT NOTE   Referring Physician: Dr. Vanita [name] Primary Physician: [name] Primary Cardiologist: Dr. Haroldine [name] Reason for Consultation: shortness of breath, improved   HPI: [name] is a 61 year old female with a PMHx of breast cancer (possible recurrence with LUL mass) and chemo induced NICM, a-fib who was recently hospitalized 6/26-02/07/17 for cardiogenic shock and again 02/11/17-02/12/17 for hyperkalemia. She is on home milrinone 0.358mcg/kg/min. She called earlier this evening (see note) with nausea and emesis, SOB, orthopnea, and weight gain. She arrived to ER this evening via EMS. She reports that after arrival, she had two voids of urine and felt much improved. She was just given IV lasix and has not voided. She still cannot lie completely flat, but she feels much less dyspneic, and she is speaking comfortably in full sentences without oxygen. She denies chest pain, syncope. Does endorse DOE, mild PND, and orthopnea.   After speaking to patient and ER team, decision is to send patient home this AM; she already has scheduled follow up tomorrow in heart failure clinic. Patient reports much improvement since speaking to me on the phone and wishes to go home.  Review of Systems:     Cardiac Review of Systems: {Y] = yes [ ]  = no  Chest Pain [  N  ]  Resting SOB [ Y  ] Exertional SOB  Joanne.[name]  ]  Orthopnea [ Y ]   Pedal Edema [  Y ]    Palpitations Aqua.Slicker  ] Syncope  [ N ]   Presyncope [ N  ]  General Review of Systems: [Y] = yes [  ]=no Constitional: recent weight change [Y  ]; anorexia [  ]; fatigue [  ]; nausea [Y  ]; night sweats [  ]; fever [  ]; or chills [  ];                                                                     Eyes : blurred vision [  ]; diplopia [   ]; vision changes [  ];  Amaurosis fugax[  ]; Resp: cough [ Y];  wheezing[  ];  hemoptysis[  ];  PND Joanne.[name]  ];  GI:  gallstones[  ], vomiting[ Y ];  dysphagia[  ]; melena[  ];  hematochezia [  ]; heartburn[  ];   GU:  kidney stones [  ]; hematuria[  ];   dysuria [  ];  nocturia[  ]; incontinence [  ];             Skin: rash, swelling[  ];, hair loss[  ];  peripheral edema[ Y ];  or itching[  ]; Musculosketetal: myalgias[  ];  joint swelling[  ];  joint erythema[  ];  joint pain[  ];  back pain[  ];  Heme/Lymph: bruising[  ];  bleeding[  ];  anemia[  ];  Neuro: TIA[  ];  headaches[  ];  stroke[  ];  vertigo[  ];  seizures[  ];   paresthesias[  ];  difficulty walking[  ];  Psych:depression[  ]; anxiety[  ];  Endocrine: diabetes[  ];  thyroid dysfunction[  ];  Other:  Past Medical History:  Diagnosis Date  . AICD (automatic  cardioverter/defibrillator) present    st jude  . Anxiety   . 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 Asthma   . Axillary mass, left 10/17/2016  . Breast cancer ([place]) 2005, 2017  . CHF (congestive heart failure) ([place])   . COPD (chronic obstructive pulmonary disease) ([place])   . Depression    followed by a psychiatrist  . DYSLIPIDEMIA 05/01/2009  . Dyspnea   . Dysrhythmia    atrial fibrillation  . Family history of breast cancer   . Family history of colon cancer   . Family history of uterine cancer   . Furuncle 10/17/2016  . Gastroparesis   . GERD (gastroesophageal reflux disease)   . Headache   . History of breast cancer 2006   with return in 2008, s/p mastectomy, XRT and chemotherapy  . History of external beam radiation therapy 09/25/16-11/13/16   left breast treated to 50.4 Gy in 28 fractions, boost 12 Gy in 6 fractions  . Hypertension   . 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 IBS (irritable bowel syndrome)   . ICD (implantable cardiac defibrillator) in place 11/10/2012   ICD (11/2012)  . Malignant neoplasm of lower-inner quadrant of left female breast ([place]) 06/26/2016  . Myocardial infarction ([place])   . OBESITY 07/24/2007  . OBSTRUCTIVE SLEEP APNEA 07/24/2007   with CPAP compliance  . Osteoarthritis   . Persistent atrial fibrillation ([place]) 09/2014  . Personal history of radiation therapy     current  . Presence of permanent cardiac pacemaker   . RESTLESS LEG SYNDROME 07/24/2007  . SYSTOLIC HEART FAILURE, CHRONIC 01/05/2009   a. chemo induced cardiomyopathy b. cMRI (02/2009) EF 45% c. Myoview 05/2010: EF 59%, no ischemia d. RHC (01/2012) RA 20, RV 55/13/22, PA 56/34 (44), PCWP 28, Fick CO/CI: 3.1 / 1.5, PVR 5.3 WU, PA sat 42% & 48% e. ECHO (10/2012) EF 20-25%, diff HK, MV calcified annulus f. ECHO (03/2014) EF 30-35%, sev HK, inferior/inferoseptal walls, mild MR     (Not in a hospital admission)   Allergies  Allergen Reactions  . Bee Venom Anaphylaxis  . Aspirin Hives  . Ciprofloxacin Hives  . Codeine Nausea Only    "can take ONLY if she eats with med"  . Naproxen Hives  . Other Other (See Comments)    ANY TYPES OF METAL-CAUSES BLISTERS SKIN AND ITCHING   . Sulfonamide Derivatives Hives  . Xarelto [Rivaroxaban] Hives  . Atorvastatin Itching  . Nsaids Itching and Rash  . Tape Rash    Also allergic to metal  . Tyler Aas Flextouch [Insulin Degludec] Itching and Rash    Social History   Social History  . Marital status: Divorced    Spouse name: N/A  . Number of children: 1  . Years of education: N/A   Occupational History  . Disabled Unemployed   Social History Main Topics  . Smoking status: Former Smoker    Packs/day: 1.50    Years: 33.00    Types: Cigarettes    Quit date: 04/12/2001  . Smokeless tobacco: Never Used     Comment: heaviest - 2.5- 3PPD x 3 years   . Alcohol use No  . Drug use: No  . Sexual activity: Not Currently   Other Topics Concern  . Not on file   Social History Narrative   Lives with mother and brother [name]          Family History  Problem Relation Age of Onset  . Diabetes Mellitus II Mother   . Lung cancer Mother   .  Congestive Heart Failure Mother   . Coronary artery disease Father        CABG x 3  . Hypertension Father   . Diabetes Mellitus II Father   . Breast cancer Cousin 62       maternal first cousin  .  Colon cancer Cousin 26       maternal first cousin  . Uterine cancer Cousin        dx in her 27s  . Colon cancer Maternal Aunt        dx in her late 43s  . Uterine cancer Maternal Aunt 66  . Lung cancer Maternal Aunt   . Uterine cancer Maternal Aunt 38  . Colon cancer Maternal Aunt 40  . Breast cancer Maternal Grandmother 33  . Uterine cancer Maternal Aunt 48  . Uterine cancer Cousin        dx in her 74s  . Uterine cancer Cousin 19    PHYSICAL EXAM: Vitals:   02/16/17 2330 02/16/17 2345  BP: 104/61 99/79  Pulse: (!) 112 (!) 112  Resp: (!) 21 (!) 23  Temp:       Intake/Output Summary (Last 24 hours) at 02/17/17 0022 Last data filed at 02/17/17 0006  Gross per 24 hour  Intake                0 ml  Output              550 ml  Net             -550 ml    General:  Sitting up in stretcher, comfortable and conversational HEENT: normal Neck: supple. JVD just above clavicle at near 90 degrees.  Cor: Regular S1 and S2, no m/r/g Lungs: faint bibasilar crackles but clear otherwise, normal work of breathing Abdomen: soft, nontender, nondistended. Well nourished. Good bowel sounds. Extremities: no cyanosis, clubbing. Trace edema lower extremities bilaterally Neuro: alert & oriented x 3, cranial nerves grossly intact. moves all 4 extremities w/o difficulty. Affect pleasant.   Results for orders placed or performed during the hospital encounter of 02/16/17 (from the past 24 hour(s))  CBG monitoring, ED     Status: Abnormal   Collection Time: 02/16/17 10:06 PM  Result Value Ref Range   Glucose-Capillary 133 (H) 65 - 99 mg/dL  Comprehensive metabolic panel     Status: Abnormal   Collection Time: 02/16/17 10:27 PM  Result Value Ref Range   Sodium 134 (L) 135 - 145 mmol/L   Potassium 3.4 (L) 3.5 - 5.1 mmol/L   Chloride 98 (L) 101 - 111 mmol/L   CO2 28 22 - 32 mmol/L   Glucose, Bld 118 (H) 65 - 99 mg/dL   BUN 36 (H) 6 - 20 mg/dL   Creatinine, Ser 1.40 (H) 0.44 - 1.00 mg/dL    Calcium 10.1 8.9 - 10.3 mg/dL   Total Protein 7.6 6.5 - 8.1 g/dL   Albumin 3.4 (L) 3.5 - 5.0 g/dL   AST 30 15 - 41 U/L   ALT 39 14 - 54 U/L   Alkaline Phosphatase 89 38 - 126 U/L   Total Bilirubin 1.4 (H) 0.3 - 1.2 mg/dL   GFR calc non Af Amer 40 (L) >60 mL/min   GFR calc Af Amer 46 (L) >60 mL/min   Anion gap 8 5 - 15  Magnesium     Status: None   Collection Time: 02/16/17 10:27 PM  Result Value Ref Range   Magnesium 1.8 1.7 -  2.4 mg/dL  Troponin I (order at [facility])     Status: Abnormal   Collection Time: 02/16/17 10:27 PM  Result Value Ref Range   Troponin I 0.04 (HH) <0.03 ng/mL  Brain natriuretic peptide (order if patient c/o SOB ONLY)     Status: Abnormal   Collection Time: 02/16/17 10:27 PM  Result Value Ref Range   B Natriuretic Peptide 658.4 (H) 0.0 - 100.0 pg/mL  CBC with Differential/Platelet     Status: Abnormal   Collection Time: 02/16/17 10:27 PM  Result Value Ref Range   WBC 8.5 4.0 - 10.5 K/uL   RBC 4.58 3.87 - 5.11 MIL/uL   Hemoglobin 13.8 12.0 - 15.0 g/dL   HCT 42.0 36.0 - 46.0 %   MCV 91.7 78.0 - 100.0 fL   MCH 30.1 26.0 - 34.0 pg   MCHC 32.9 30.0 - 36.0 g/dL   RDW 17.1 (H) 11.5 - 15.5 %   Platelets 307 150 - 400 K/uL   Neutrophils Relative % 74 %   Neutro Abs 6.4 1.7 - 7.7 K/uL   Lymphocytes Relative 15 %   Lymphs Abs 1.3 0.7 - 4.0 K/uL   Monocytes Relative 9 %   Monocytes Absolute 0.7 0.1 - 1.0 K/uL   Eosinophils Relative 1 %   Eosinophils Absolute 0.1 0.0 - 0.7 K/uL   Basophils Relative 1 %   Basophils Absolute 0.0 0.0 - 0.1 K/uL  Protime-INR     Status: Abnormal   Collection Time: 02/16/17 10:27 PM  Result Value Ref Range   Prothrombin Time 22.3 (H) 11.4 - 15.2 seconds   INR 1.93   CBG monitoring, ED     Status: None   Collection Time: 02/16/17 11:21 PM  Result Value Ref Range   Glucose-Capillary 99 65 - 99 mg/dL   *Note: Due to a large number of results and/or encounters for the requested time period, some results have not been displayed. A  complete set of results can be found in Results Review.   Dg Chest Portable 1 View  Result Date: 02/16/2017 CLINICAL DATA:  61 year old female with history of mid chest pressure and shortness of breath tonight. History of right-sided breast cancer diagnosed in 2005 treated with chemotherapy and radiation therapy. EXAM: PORTABLE CHEST 1 VIEW COMPARISON:  Chest x-ray 02/11/2017. FINDINGS: There is a right-sided internal jugular central venous catheter with tip terminating in the distal superior vena cava. Left-sided pacemaker/AICD with lead tip projecting over the expected location of the right ventricular apex. Lung volumes are normal. Diffuse peribronchial cuffing and interstitial prominence, similar to prior studies. No confluent consolidative airspace disease. No pleural effusions. Cephalization of the pulmonary vasculature. Mild cardiomegaly. Upper mediastinal contours are within normal limits. Aortic atherosclerosis. IMPRESSION: 1. The appearance the chest again suggests mild congestive heart failure. The possibility of concurrent bronchitis could also be considered given the extensive peribronchial cuffing. 2. Aortic atherosclerosis. Electronically Signed   By: Vinnie Langton M.D.   On: 02/16/2017 22:36   ASSESSMENT/PLAN: Ms. Drinkard is a 61 yo woman with NICM and multiple recent hospitalizations for acute on chronic heart failure, on home milrinone, who called earlier this evening and arrived by ambulance for evaluation of shortness of breath, orthopnea, weight gain, and nausea.  She is much improved since when she spoke to me earlier this evening on the phone--she can speak in full sentences and reports tolerating PO intake. She has had two voids and is anticipating additional after receiving IV lasix. She endorses less shortness of  breath and slight improvement in her orthopnea, though it is still present.  Patient strongly prefers to go home and return tomorrow for outpatient follow up of her  heart failure. Given that she reports symptomatic improvement, and her labs are stable (Cr, K) to improved (BNP), agree w/ER attending that she can be discharged. She understands need for close follow up, and she is aware of warning signs that necessitate re-presenting to ER.  Given her labile potassium, recommend rechecking a metabolic panel in clinic tomorrow.  Dr. Buford Dresser, overnight cardiology provider

## 2017-02-17 NOTE — Telephone Encounter (Signed)
LMOVM requesting that pt send manual transmission b/c home monitor has not updated in at least 7 days.    

## 2017-02-17 NOTE — Progress Notes (Signed)
Patient ID: [name], female   DOB: Dec 26, 1955, 61 y.o.   MRN: [id number]    Advanced Heart Failure Clinic Note   Endocrinologist: Dr Loanne Drilling  Oncologist: Dr. Humphrey Rolls PCP: [name], NP ([place]) Primary HF MD: Dr Haroldine Laws  HPI Ms. Talamante is a 61 year old female with history of R breast cancer (2005, 2008), L beast cancer (2017) obesity, HTN, DM, IBS, HLD, OSA, chemo induced cardiomyopathy s/p ICD and chronic systolic HF.    She had chemo induced cardiomyopathy.  Her EF has fallen from 45% per Cardiac MRI in July 2010 to 20-25% in 10/2012. S/P single chamber ICD (St Jude) implantation on 11/10/12 by Dr. Rayann Heman.    Admitted 08/19/15 with chest pain. Had [place] [facility] with mild CAD and increased filling pressures.  Admitted 12/21/15 with increased dyspnea and chest pain. Additionally, a new breast lump was discovered, with outpatient follow up arranged. She diuresed 15 lbs. Discharge weight 213 lbs.  Admitted 02/2016 with GIB. 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 Ardis Hughs. Eliquis restarted without problem.   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 Had lumpectomy 07/24/16 with Dr. Dalbert Batman. Completed XRT. No chemo planned.  Admitted a couple times in July with HF, A fib, and low output heart failure. Now on home milrinone 0.375 mcg. Also unsuccessful DC-CV. Now chronic a fib.   Today she returns for HF follow up. She was seen in [facility] ED 02/16/2017. Overall feeling terrible. Complaining of nausea and sob. SOB with activity. Weight at home up 201 pounds. No fever or chills. Poor appetite. Taking all medications. [facility] following.   CPX 12/23/16: FVC 2.15 (66%)    FEV1 1.57 (62%)     FEV1/FVC 73% (92%)     MVV 58 (62%)      Resting HR: 93 Peak HR: 116  (73% age predicted max HR) BP rest: 88/60 BP peak: 104/60  Peak VO2: 9.2 (52% predicted peak VO2) VE/VCO2 slope: 51 OUES: 1.07 Peak RER: 0.99 Ventilatory Threshold: 8.0 (45%  predicted or measured peak VO2) VE/MVV: 71% O2pulse: 8  (73% predicted O2pulse)  CPX 09/11/2015  Peak VO2: 12.7 (73% predicted peak VO2) VE/VCO2 slope: 32.3 OUES: 1.62 Peak RER: 1.08    RHC/LCH  08/20/2014   Mid Cx lesion, 30% stenosed.  3rd Mrg lesion, 30% stenosed. Findings: RA = 12 RV = 55/6/12 PA = 54/21 (34) PCW = 22 Ao 93/58 (71) LV 106/8/26 Fick cardiac output/index = 4.2/2.1 PVR = 2.8 SVR = 1115 FA sat = 93% PA sat = 54%, 53% Assessment: 1. Very mild CAD 2. Mild to moderately increased filling pressures 3. Severe LV dysfunction due to NICM EF 20%medications.   [place] on 01/30/12 showed decompensated HF with low cardiac output consistent with cardiogenic shock.  [place] 6/27  RA = 20  RV = 55/13/22  PA =56/34 (44)  PCW = 28  Fick cardiac output/index = 3.1/1.5  PVR = 5.3 [place]  FA sat = 91%  PA sat = 42%, 48%  SVC sat = 46%  Echo 08/12/11: Left ventricle: mildly dilated, EF 20% to 25%. Diffuse hypokinesis. Doppler parameters are consistent with restrictive physiology, indicative of decreased left ventricular diastolic compliance and/or increased left atrial pressure. Left atrium was moderately dilated. Right atrium mildly dilated. 04/09/12 ECHO EF 30-35% LV mildly dilated  Hyopkinesis 10/07/12 ECHO EF 20-25% RV ok. 03/31/14: ECHO EF 30-35%, severe HK inferior/inferoseptal walls, mild MR, LA mod dilated, RA mildly  dilated 06/28/2015: ECHO EF 25% RV mildly dilated. Severe HK inferior wall.  08/21/2015: Echo EF 30-35% Grade III DD  Myoview in 05/2010 EF 59%. No ischemia.   Review of systems complete and found to be negative unless listed in HPI.    Social History   Social History  . Marital status: Divorced    Spouse name: N/A  . Number of children: 1  . Years of education: N/A   Occupational History  . Disabled Unemployed   Social History Main Topics  . Smoking status: Former Smoker    Packs/day: 1.50    Years: 33.00    Types: Cigarettes    Quit date:  04/12/2001  . Smokeless tobacco: Never Used     Comment: heaviest - 2.5- 3PPD x 3 years   . Alcohol use No  . Drug use: No  . Sexual activity: Not Currently   Other Topics Concern  . Not on file   Social History Narrative   Lives with mother and brother Philis Nettle         Family History  Problem Relation Age of Onset  . Diabetes Mellitus II Mother   . Lung cancer Mother   . Congestive Heart Failure Mother   . Coronary artery disease Father        CABG x 3  . Hypertension Father   . Diabetes Mellitus II Father   . Breast cancer Cousin 30       maternal first cousin  . Colon cancer Cousin 28       maternal first cousin  . Uterine cancer Cousin        dx in her 52s  . Colon cancer Maternal Aunt        dx in her late 15s  . Uterine cancer Maternal Aunt 66  . Lung cancer Maternal Aunt   . Uterine cancer Maternal Aunt 38  . Colon cancer Maternal Aunt 39  . Breast cancer Maternal Grandmother 84  . Uterine cancer Maternal Aunt 48  . Uterine cancer Cousin        dx in her 71s  . Uterine cancer Cousin 90    Past Medical History:  Diagnosis Date  . AICD (automatic cardioverter/defibrillator) present    st jude  . Anxiety   . 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 Asthma   . Axillary mass, left 10/17/2016  . Breast cancer ([place]) 2005, 2017  . CHF (congestive heart failure) ([place])   . COPD (chronic obstructive pulmonary disease) ([place])   . Depression    followed by a psychiatrist  . DYSLIPIDEMIA 05/01/2009  . Dyspnea   . Dysrhythmia    atrial fibrillation  . Family history of breast cancer   . Family history of colon cancer   . Family history of uterine cancer   . [place] 10/17/2016  . Gastroparesis   . GERD (gastroesophageal reflux disease)   . Headache   . History of breast cancer 2006   with return in 2008, s/p mastectomy, XRT and chemotherapy  . History of external beam radiation therapy 09/25/16-11/13/16   left breast treated to 50.4 Gy in 28 fractions, boost 12 Gy in 6 fractions    . Hypertension   . 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 IBS (irritable bowel syndrome)   . ICD (implantable cardiac defibrillator) in place 11/10/2012   ICD (11/2012)  . Malignant neoplasm of lower-inner quadrant of left female breast ([place]) 06/26/2016  . Myocardial infarction ([place])   . OBESITY 07/24/2007  . OBSTRUCTIVE SLEEP APNEA  07/24/2007   with CPAP compliance  . Osteoarthritis   . Persistent atrial fibrillation ([place]) 09/2014  . Personal history of radiation therapy    current  . Presence of permanent cardiac pacemaker   . RESTLESS LEG SYNDROME 07/24/2007  . SYSTOLIC HEART FAILURE, CHRONIC 01/05/2009   a. chemo induced cardiomyopathy b. cMRI (02/2009) EF 45% c. Myoview 05/2010: EF 59%, no ischemia d. RHC (01/2012) RA 20, RV 55/13/22, PA 56/34 (44), PCWP 28, Fick CO/CI: 3.1 / 1.5, PVR 5.3 WU, PA sat 42% & 48% e. ECHO (10/2012) EF 20-25%, diff HK, MV calcified annulus f. ECHO (03/2014) EF 30-35%, sev HK, inferior/inferoseptal walls, mild MR    Current Outpatient Prescriptions  Medication Sig Dispense Refill  . acetaminophen (TYLENOL) 650 MG CR tablet Take 1,300 mg by mouth every 8 (eight) hours as needed for pain.    Marland Kitchen albuterol (PROVENTIL HFA) 108 (90 BASE) MCG/ACT inhaler Inhale 2 puffs into the lungs every 6 (six) hours as needed. For shortness of breath    . ALPRAZolam (XANAX) 0.5 MG tablet Take 0.25 mg by mouth 4 (four) times daily as needed for anxiety.     Marland Kitchen amiodarone (PACERONE) 200 MG tablet Take 1 tablet (200 mg total) by mouth 2 (two) times daily. 120 tablet 3  . apixaban (ELIQUIS) 5 MG TABS tablet Take 1 tablet (5 mg total) by mouth 2 (two) times daily. 180 tablet 3  . baclofen (LIORESAL) 20 MG tablet Take 20 mg by mouth 2 (two) times daily as needed for muscle spasms.    . busPIRone (BUSPAR) 30 MG tablet Take 30 mg by mouth 2 (two) times daily.     . Calcium Carb-Cholecalciferol (CALCIUM 600+D3) 600-800 MG-UNIT TABS Take by mouth daily.    . Cholecalciferol (VITAMIN D) 2000  units CAPS Take 1 capsule by mouth daily.    . digoxin (LANOXIN) 0.125 MG tablet Take 1 tablet (0.125 mg total) by mouth every other day. 45 tablet 3  . docusate sodium (COLACE) 100 MG capsule Take 3 capsules (300 mg total) by mouth at bedtime. 10 capsule 0  . doxycycline (VIBRA-TABS) 100 MG tablet Take 1 tablet (100 mg total) by mouth every 12 (twelve) hours. 13 tablet 0  . DULoxetine (CYMBALTA) 30 MG capsule Take 30 mg by mouth daily.    . fluconazole (DIFLUCAN) 200 MG tablet Take 1 tablet (200 mg total) by mouth daily. 2 tablet 0  . Garlic 956 MG CAPS Take 500 mg by mouth daily after lunch.     . Glucos-Chondroit-Hyaluron-MSM (GLUCOSAMINE CHONDROITIN JOINT PO) Take 1 tablet by mouth daily.    Marland Kitchen HUMALOG KWIKPEN 100 UNIT/ML KiwkPen Inject 14-24 Units into the skin 3 (three) times daily. On average per patient 24 unit with breakfast, 16 units with lunch, & 14-16 units with supper    . HUMALOG MIX 75/25 KWIKPEN (75-25) 100 UNIT/ML Kwikpen Inject 60 Units into the skin 2 (two) times daily.     . hydrOXYzine (ATARAX/VISTARIL) 50 MG tablet Take 50 mg by mouth every 6 (six) hours as needed for itching ((primarily taken at bedtime)).     . IRON PO Take 24 mg by mouth daily.    Marland Kitchen levothyroxine (SYNTHROID, LEVOTHROID) 75 MCG tablet Take 75 mcg by mouth daily before breakfast.     . Melatonin 1 MG TABS Take 0.5 mg by mouth at bedtime.    . metolazone (ZAROXOLYN) 2.5 MG tablet Take 1 tablet (2.5 mg total) by mouth as needed (for weight gain >3  lbs). 45 tablet 3  . milrinone (PRIMACOR) 20 MG/100 ML SOLN infusion Inject 35.2875 mcg/min into the vein continuous. 100 mL 0  . mometasone (NASONEX) 50 MCG/ACT nasal spray Place 1 spray into both nostrils daily. allergies    . Multiple Minerals (CALCIUM-MAGNESIUM-ZINC) TABS Take 1 tablet by mouth daily after lunch.     . nitroGLYCERIN (NITROSTAT) 0.4 MG SL tablet Place 1 tablet (0.4 mg total) under the tongue every 5 (five) minutes as needed. For chest pain 25 tablet  3  . Omega-3 Fatty Acids (FISH OIL) 1000 MG CAPS Take 2 capsules by mouth daily.    . pantoprazole (PROTONIX) 40 MG tablet Take 40 mg by mouth 2 (two) times daily.      . polyethylene glycol powder (GLYCOLAX/MIRALAX) powder Mix 17 grams in 8 oz of water twice daily 850 g 3  . potassium chloride SA (K-DUR,KLOR-CON) 20 MEQ tablet Take 3 tablets (60 mEq total) by mouth 2 (two) times daily. 720 tablet 3  . Propylene Glycol (SYSTANE BALANCE) 0.6 % SOLN Place 1 drop into both eyes 3 (three) times daily.    Marland Kitchen pyridOXINE (VITAMIN B-6) 100 MG tablet Take 100 mg by mouth daily after lunch.     Marland Kitchen rOPINIRole (REQUIP) 1 MG tablet Take 1 mg by mouth at bedtime and may repeat dose one time if needed. IF RESTLESS LEGS PERSIST PATIENT MAY REPEAT ADDITIONAL DOSE    . rosuvastatin (CRESTOR) 10 MG tablet Take 10 mg by mouth at bedtime.    . Simethicone Extra Strength 125 MG CAPS Take 1 tablet by mouth daily as needed (for bloating). Reported on 02/23/2016    . spironolactone (ALDACTONE) 25 MG tablet Take 1 tablet (25 mg total) by mouth 2 (two) times daily. 60 tablet 6  . SUMAtriptan (IMITREX) 100 MG tablet Take 100 mg by mouth daily as needed for migraine.     . tiotropium (SPIRIVA) 18 MCG inhalation capsule Place 18 mcg into inhaler and inhale daily.      Marland Kitchen torsemide (DEMADEX) 20 MG tablet Take 2 tablets (40 mg total) by mouth 2 (two) times daily. 120 tablet 6  . traMADol (ULTRAM) 50 MG tablet Take 1 tablet by mouth daily as needed for moderate pain.     . vitamin B-12 (CYANOCOBALAMIN) 1000 MCG tablet Take 1,000 mcg by mouth daily after lunch.     . vitamin C (ASCORBIC ACID) 500 MG tablet Take 500 mg by mouth daily after lunch.     . vitamin E 400 UNIT capsule Take 400 Units by mouth daily after lunch.      No current facility-administered medications for this encounter.    There were no vitals filed for this visit. Wt Readings from Last 3 Encounters:  02/16/17 203 lb 6.4 oz (92.3 kg)  02/12/17 199 lb 11.2 oz  (90.6 kg)  02/07/17 202 lb 3.2 oz (91.7 kg)     PHYSICAL EXAM: General:  Well appearing. No resp difficulty. Dyspnea with exertion HEENT: normal Neck: supple. JVP to jaw. Carotids 2+ bilat; no bruits. No lymphadenopathy or thryomegaly appreciated. Cor: PMI nondisplaced. Regular rate & rhythm. No rubs, gallops or murmurs. R upper chest  Lungs: clear Abdomen: soft, nontender, nondistended. No hepatosplenomegaly. No bruits or masses. Good bowel sounds. Extremities: no cyanosis, clubbing, rash, R and LLE 2+ edema Neuro: alert & orientedx3, cranial nerves grossly intact. moves all 4 extremities w/o difficulty. Affect pleasant  ASSESSMENT & PLAN:  1) Chronic systolic HF, NICM thought to be chemo induced,  s/p ST Jude  ICD.ECHO 06/28/2015  EF ~20%. RV mildly dilated. Echo 1/17 30-35%. Ech 5/18 EF 15% RV ok - NYHA III-IV. On milrinone 0.375 mcg. Volume status elevate. Give 80 mg IV lasix in the clinic.  Increase torsemide to 60 mg twice a day and start 2.5 mg metolazone every Friday.  -Off bb with milrinone 0.375 mcg.   -Add diuretic protocol by [facility]. I contaced them today.   -Continue spiro 25 mg twice a day.  - Continue digoxin 0.125 mg daily .   - Reinforced fluid restriction to < 2 L daily, sodium restriction to less than 2000 mg daily, and the importance of daily weights.   2) Chronic  Afib - S/P DC-CV 02/02/2017 and 02/06/2017 which was unsuccessful.  Continue Eliquis 5 mg BID.  Continue amio 200 mg twice a day. Rate controlled.  Check CBC today.  - CHADsVASC 4 (HTN, CAD, HF, gender) 3) OSA -Continue CPAP .  4) HLD - Followed by PCP.  5) CAD - mild nonobstructive CAD on 2016 cath.Will need repeat cath as part of VAD w/u.  - No S/S ischemia. Continue statin, BB, and ACE-I.  6) NSVT - Continue amio 200 mg twice a day. . Needs yearly eye exam. TSH stable 09/2016   7) Breast Cancer, bialterally - most recent left breast CA  s/p lumpectomy and Radiation 07/2016. Follow up with Dr Lindi Adie this  next week.    Check bnp, CO-OX mag, cbc, and bmet today. Give 80 mg IV lasixin the HF clinic. Not sure she will be a candidate for LVAD with new lung nodules. Needs HFSW follow up next week for GOC.    Todays CO-OX low. Increase milrinone to 0.5 mcg. [facility] was contacted.   Greater than 50% of the (total minutes 60) visit spent in counseling/coordination of care regarding heart failure, volume overload, and coordination care with [facility].    Follow up next week.   Darrick Grinder, NP  4:19 PM

## 2017-02-18 ENCOUNTER — Telehealth (HOSPITAL_COMMUNITY): Payer: Self-pay | Admitting: *Deleted

## 2017-02-18 ENCOUNTER — Telehealth (HOSPITAL_COMMUNITY): Payer: Self-pay

## 2017-02-18 ENCOUNTER — Encounter (HOSPITAL_COMMUNITY): Payer: Self-pay

## 2017-02-18 ENCOUNTER — Ambulatory Visit (HOSPITAL_COMMUNITY)
Admission: RE | Admit: 2017-02-18 | Discharge: 2017-02-18 | Disposition: A | Discharge status: Home / Self Care | Payer: Medicare Other | Source: Ambulatory Visit | Attending: Cardiology | Admitting: Cardiology

## 2017-02-18 VITALS — BP 102/64 | HR 86 | Wt 205.0 lb

## 2017-02-18 DIAGNOSIS — I4891 Unspecified atrial fibrillation: Secondary | ICD-10-CM

## 2017-02-18 DIAGNOSIS — F329 Major depressive disorder, single episode, unspecified: Secondary | ICD-10-CM | POA: Diagnosis not present

## 2017-02-18 DIAGNOSIS — E785 Hyperlipidemia, unspecified: Secondary | ICD-10-CM | POA: Insufficient documentation

## 2017-02-18 DIAGNOSIS — Z7901 Long term (current) use of anticoagulants: Secondary | ICD-10-CM | POA: Diagnosis not present

## 2017-02-18 DIAGNOSIS — Z87891 Personal history of nicotine dependence: Secondary | ICD-10-CM | POA: Diagnosis not present

## 2017-02-18 DIAGNOSIS — I472 Ventricular tachycardia: Secondary | ICD-10-CM | POA: Diagnosis not present

## 2017-02-18 DIAGNOSIS — K589 Irritable bowel syndrome without diarrhea: Secondary | ICD-10-CM | POA: Insufficient documentation

## 2017-02-18 DIAGNOSIS — G4733 Obstructive sleep apnea (adult) (pediatric): Secondary | ICD-10-CM | POA: Insufficient documentation

## 2017-02-18 DIAGNOSIS — I2510 Atherosclerotic heart disease of native coronary artery without angina pectoris: Secondary | ICD-10-CM | POA: Insufficient documentation

## 2017-02-18 DIAGNOSIS — C50912 Malignant neoplasm of unspecified site of left female breast: Secondary | ICD-10-CM | POA: Insufficient documentation

## 2017-02-18 DIAGNOSIS — Z853 Personal history of malignant neoplasm of breast: Secondary | ICD-10-CM | POA: Diagnosis not present

## 2017-02-18 DIAGNOSIS — I427 Cardiomyopathy due to drug and external agent: Secondary | ICD-10-CM | POA: Insufficient documentation

## 2017-02-18 DIAGNOSIS — E669 Obesity, unspecified: Secondary | ICD-10-CM | POA: Diagnosis not present

## 2017-02-18 DIAGNOSIS — I110 Hypertensive heart disease with heart failure: Secondary | ICD-10-CM | POA: Diagnosis not present

## 2017-02-18 DIAGNOSIS — E119 Type 2 diabetes mellitus without complications: Secondary | ICD-10-CM | POA: Insufficient documentation

## 2017-02-18 DIAGNOSIS — I482 Chronic atrial fibrillation: Secondary | ICD-10-CM | POA: Insufficient documentation

## 2017-02-18 DIAGNOSIS — Z923 Personal history of irradiation: Secondary | ICD-10-CM | POA: Diagnosis not present

## 2017-02-18 DIAGNOSIS — F419 Anxiety disorder, unspecified: Secondary | ICD-10-CM | POA: Insufficient documentation

## 2017-02-18 DIAGNOSIS — I5022 Chronic systolic (congestive) heart failure: Secondary | ICD-10-CM | POA: Diagnosis not present

## 2017-02-18 DIAGNOSIS — J449 Chronic obstructive pulmonary disease, unspecified: Secondary | ICD-10-CM | POA: Diagnosis not present

## 2017-02-18 DIAGNOSIS — Z95810 Presence of automatic (implantable) cardiac defibrillator: Secondary | ICD-10-CM | POA: Diagnosis not present

## 2017-02-18 DIAGNOSIS — Z451 Encounter for adjustment and management of infusion pump: Secondary | ICD-10-CM | POA: Diagnosis not present

## 2017-02-18 LAB — BASIC METABOLIC PANEL
Anion gap: 10 (ref 5–15)
BUN: 45 mg/dL — AB (ref 6–20)
CALCIUM: 10.1 mg/dL (ref 8.9–10.3)
CO2: 26 mmol/L (ref 22–32)
Chloride: 98 mmol/L — ABNORMAL LOW (ref 101–111)
Creatinine, Ser: 1.85 mg/dL — ABNORMAL HIGH (ref 0.44–1.00)
GFR calc Af Amer: 33 mL/min — ABNORMAL LOW (ref >60)
GFR, EST NON AFRICAN AMERICAN: 28 mL/min — AB (ref >60)
GLUCOSE: 154 mg/dL — AB (ref 65–99)
POTASSIUM: 5.4 mmol/L — AB (ref 3.5–5.1)
SODIUM: 134 mmol/L — AB (ref 135–145)

## 2017-02-18 LAB — COOXEMETRY PANEL
Carboxyhemoglobin: 1.2 % (ref 0.5–1.5)
METHEMOGLOBIN: 0.8 % (ref 0.0–1.5)
O2 Saturation: 41.9 %
TOTAL HEMOGLOBIN: 14.7 g/dL (ref 12.0–16.0)

## 2017-02-18 LAB — CBC
HEMATOCRIT: 42.9 % (ref 36.0–46.0)
HEMOGLOBIN: 13.7 g/dL (ref 12.0–15.0)
MCH: 29.1 pg (ref 26.0–34.0)
MCHC: 31.9 g/dL (ref 30.0–36.0)
MCV: 91.3 fL (ref 78.0–100.0)
Platelets: 369 10*3/uL (ref 150–400)
RBC: 4.7 MIL/uL (ref 3.87–5.11)
RDW: 17.3 % — ABNORMAL HIGH (ref 11.5–15.5)
WBC: 8.8 10*3/uL (ref 4.0–10.5)

## 2017-02-18 LAB — BRAIN NATRIURETIC PEPTIDE: B Natriuretic Peptide: 668.5 pg/mL — ABNORMAL HIGH (ref 0.0–100.0)

## 2017-02-18 LAB — MAGNESIUM: MAGNESIUM: 2.1 mg/dL (ref 1.7–2.4)

## 2017-02-18 MED ORDER — POTASSIUM CHLORIDE CRYS ER 20 MEQ PO TBCR: 60.0000 meq | EXTENDED_RELEASE_TABLET | Freq: Two times a day (BID) | ORAL | 3 refills | Status: DC

## 2017-02-18 MED ORDER — METOLAZONE 2.5 MG PO TABS: 2.5000 mg | ORAL_TABLET | ORAL | 3 refills | Status: DC

## 2017-02-18 MED ORDER — POTASSIUM CHLORIDE CRYS ER 20 MEQ PO TBCR: EXTENDED_RELEASE_TABLET | ORAL | 3 refills | Status: DC

## 2017-02-18 MED ORDER — TORSEMIDE 20 MG PO TABS: 60.0000 mg | ORAL_TABLET | Freq: Two times a day (BID) | ORAL | 6 refills | Status: AC

## 2017-02-18 NOTE — Telephone Encounter (Signed)
Called and left patient a detailed message per Amy clegg, NP stating:  to hold her potassium tonight and to resume her previous dose of 60 mEq Twice Daily tomorrow.  I told her to call us back if she had any other questions. Medication updated.

## 2017-02-18 NOTE — Telephone Encounter (Signed)
CHF Clinic appointment reminder call placed to patient for upcoming post-hospital follow up.  LVMTCB to confirm apt.  Patient also reminded to take all medications as prescribed on the day of his/her appointment and to bring all medications to this appointment.  Advised to call our office for tardiness or cancellations/rescheduling needs.  .[place], Megan Genevea  

## 2017-02-18 NOTE — Patient Instructions (Signed)
   Follow up next Monday   Increase torsemide to 60 mg twice a day  Take 2.5 mg metolazone every Friday and as needed  Add 40 meq of potassium at lunch time  Do the following things EVERYDAY: 1) Weigh yourself in the morning before breakfast. Write it down and keep it in a log. 2) Take your medicines as prescribed 3) Eat low salt foods-Limit salt (sodium) to 2000 mg per day.  4) Stay as active as you can everyday 5) Limit all fluids for the day to less than 2 liters

## 2017-02-19 ENCOUNTER — Other Ambulatory Visit: Payer: Self-pay

## 2017-02-19 DIAGNOSIS — Z451 Encounter for adjustment and management of infusion pump: Secondary | ICD-10-CM | POA: Diagnosis not present

## 2017-02-19 DIAGNOSIS — I5042 Chronic combined systolic (congestive) and diastolic (congestive) heart failure: Secondary | ICD-10-CM | POA: Diagnosis not present

## 2017-02-19 DIAGNOSIS — Z7901 Long term (current) use of anticoagulants: Secondary | ICD-10-CM | POA: Diagnosis not present

## 2017-02-19 NOTE — Patient Outreach (Addendum)
Transition of care: Incoming call from patient. Patient reports that she feels good today. Reports that she can actually talk without any shortness of breath, reports that she was able to take a nap today.  Reports decrease swelling today. States that her medications were changed and she has been urinating more today. Reports that she is following her fluid and sodium restrictions.  Reports that she has been recording her intake.  Vitals:   02/19/17 1402  Weight: 203 lb 12.8 oz (92.4 kg)    Reports that home care nurse is coming tomorrow and patient returns to the heart failure clinic on Monday for a recheck.   Reports CBG reading today of 206.  States that she has had some occasional episodes of hypoglycemia and she has adjusted her insulin doses based on her CBG readings.   Patient reports good family support.  PLAN: will update assigned case manager. Provided my contact information if patient needed any assistance for the rest of the week.  Tomasa Rand, RN, BSN, CEN [facility] [employer] [phone]

## 2017-02-19 NOTE — Patient Outreach (Signed)
Transition of care:  Reviewed MEDICAL RECORD NUMBER Emergency Department visit 02/17/2015 CHF clinic visit on 02/17/2017  Placed call to patient for transition of care. No answer. Left  A message requesting a call back.  PLAN: will await a call back. If no response will attempt again.  Tomasa Rand, RN, BSN, CEN [facility] [employer] [phone]

## 2017-02-20 ENCOUNTER — Inpatient Hospital Stay (HOSPITAL_COMMUNITY): Payer: Medicare Other

## 2017-02-20 ENCOUNTER — Telehealth: Payer: Self-pay | Admitting: Pharmacist

## 2017-02-20 DIAGNOSIS — G4733 Obstructive sleep apnea (adult) (pediatric): Secondary | ICD-10-CM | POA: Diagnosis not present

## 2017-02-20 DIAGNOSIS — I5023 Acute on chronic systolic (congestive) heart failure: Secondary | ICD-10-CM | POA: Diagnosis not present
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
## 2017-02-21 ENCOUNTER — Ambulatory Visit (HOSPITAL_BASED_OUTPATIENT_CLINIC_OR_DEPARTMENT_OTHER): Payer: Medicare Other | Admitting: Hematology and Oncology

## 2017-02-21 ENCOUNTER — Encounter: Payer: Self-pay | Admitting: Hematology and Oncology

## 2017-02-21 DIAGNOSIS — C50511 Malignant neoplasm of lower-outer quadrant of right female breast: Secondary | ICD-10-CM

## 2017-02-21 DIAGNOSIS — Z171 Estrogen receptor negative status [ER-]: Secondary | ICD-10-CM | POA: Diagnosis not present

## 2017-02-21 DIAGNOSIS — R911 Solitary pulmonary nodule: Secondary | ICD-10-CM

## 2017-02-21 NOTE — Progress Notes (Signed)
Patient Care Team: Lowella Dandy, NP as PCP - General (Internal Medicine) Alfonzo Feller, RN as [place] Management  DIAGNOSIS:  Encounter Diagnosis  Name Primary?  . Malignant neoplasm of lower-outer quadrant of right breast of female, estrogen receptor negative ([place])     SUMMARY OF ONCOLOGIC HISTORY:   Breast cancer of lower-outer quadrant of right female breast ([place])   06/13/2004 Surgery    Right breast lumpectomy: Invasive ductal carcinoma, 1.7 cm, grade 3 with high-grade DCIS, 3 lymph nodes negative      07/16/2004 - 10/15/2004 Chemotherapy    Adjuvant chemotherapy with FEC. 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 The Hereditary Gene Panel offered by [facility] includes sequencing and/or deletion duplication testing of the following 43 genes: APC, ATM, AXIN2, BARD1, BMPR1A, BRCA1, BRCA2, BRIP1, CDH1, CDKN2A (p14ARF), CDKN2A (p16INK4a), CHEK2, DICER1, EPCAM (Deletion/duplication testing only), GREM1 (promoter region deletion/duplication testing only), KIT, MEN1, MLH1, MSH2, MSH6, MUTYH, NBN, NF1, PALB2, PDGFRA, PMS2, POLD1, POLE, PTEN, RAD50, RAD51C, RAD51D, SDHB, SDHC, SDHD, SMAD4, SMARCA4. STK11, TP53, TSC1, TSC2, and VHL.  The following gene was evaluated for sequence changes only: SDHA and HOXB13 c.251G>A variant only.        09/25/2016 - 11/13/2016 Radiation Therapy    Adjuvant  radiation therapy      01/28/2017 - 02/07/2017 Hospital Admission    Acute on chronic systolic heart failure with cardiogenic shock, atrial fibrillation with RVR       Malignant neoplasm of lower-inner quadrant of left female breast ([place])   06/24/2016 Initial Diagnosis    Left breast bx 7:00: Malignant spindle cell carcinoma, ADH, pos for CK 8/18, CK 903, CK 5/6, CK AE1/AE3, neg for smooth muscle actin, CD34, desmin, calponin, E-cadherin, ER 0%, PR 0%, Ki-67 20%, HER-2 neg ratio 1.21; 1.2 cm, T1 CN 0 stage IA       CHIEF COMPLIANT:  Follow-up of left breast spindle cell neoplasm  INTERVAL HISTORY: [name] is a 82 year with above-mentioned history left breast spindle cell neoplasm 1 underwent lumpectomy followed by radiation and is currently on surveillance. Recently she was in the hospital with congestive heart failure and was discharged home on 02/07/2017. In the hospital she was noted to have lung nodules on CT scans. Cardiology wants her to get a PET/CT scan. This will help them determine if she can be a candidate for heart pump.  REVIEW OF SYSTEMS:   Constitutional: Denies fevers, chills or abnormal weight loss Eyes: Denies blurriness of vision Ears, nose, mouth, throat, and face: Denies mucositis or sore throat Respiratory: Shortness of breath Cardiovascular: Shortness of breath Denies palpitation, chest discomfort Gastrointestinal:  Denies nausea, heartburn or change in bowel habits Skin: Denies abnormal skin rashes Lymphatics: Denies  new lymphadenopathy or easy bruising Neurological:Denies numbness, tingling or new weaknesses Behavioral/Psych: Mood is stable, no new changes  Extremities: 2+ lower extremity edema Breast:  denies any pain or lumps or nodules in either breasts All other systems were reviewed with the patient and are negative.  I have reviewed the past medical history, past surgical history, social history and family history with the patient and they are  unchanged from previous note.  ALLERGIES:  is allergic to bee venom; aspirin; ciprofloxacin; codeine; naproxen; other; sulfonamide derivatives; xarelto [rivaroxaban]; atorvastatin; nsaids; tape; and tresiba flextouch [insulin degludec].  MEDICATIONS:  Current Outpatient Prescriptions  Medication Sig Dispense Refill  . acetaminophen (TYLENOL) 650 MG CR tablet Take 1,300 mg by mouth every 8 (eight) hours as needed for pain.    Marland Kitchen albuterol (PROVENTIL HFA) 108 (90 BASE) MCG/ACT inhaler Inhale 2 puffs into the lungs every 6 (six) hours as needed. For shortness of breath    . ALPRAZolam (XANAX) 0.5 MG tablet Take 0.25 mg by mouth 4 (four) times daily as needed for anxiety.     Marland Kitchen amiodarone (PACERONE) 200 MG tablet Take 1 tablet (200 mg total) by mouth 2 (two) times daily. 120 tablet 3  . apixaban (ELIQUIS) 5 MG TABS tablet Take 1 tablet (5 mg total) by mouth 2 (two) times daily. 180 tablet 3  . baclofen (LIORESAL) 20 MG tablet Take 20 mg by mouth 2 (two) times daily as needed for muscle spasms.    . busPIRone (BUSPAR) 30 MG tablet Take 30 mg by mouth 2 (two) times daily.     . Calcium Carb-Cholecalciferol (CALCIUM 600+D3) 600-800 MG-UNIT TABS Take by mouth daily.    . Cholecalciferol (VITAMIN D) 2000 units CAPS Take 1 capsule by mouth daily.    . digoxin (LANOXIN) 0.125 MG tablet Take 1 tablet (0.125 mg total) by mouth every other day. 45 tablet 3  . docusate sodium (COLACE) 100 MG capsule Take 3 capsules (300 mg total) by mouth at bedtime. 10 capsule 0  . doxycycline (VIBRA-TABS) 100 MG tablet Take 1 tablet (100 mg total) by mouth every 12 (twelve) hours. 13 tablet 0  . DULoxetine (CYMBALTA) 30 MG capsule Take 30 mg by mouth daily.    . fluconazole (DIFLUCAN) 200 MG tablet Take 1 tablet (200 mg total) by mouth daily. 2 tablet 0  . Garlic 500 MG CAPS Take 500 mg by mouth daily after lunch.     . Glucos-Chondroit-Hyaluron-MSM (GLUCOSAMINE CHONDROITIN JOINT PO) Take 1 tablet by mouth daily.    Marland Kitchen  HUMALOG KWIKPEN 100 UNIT/ML KiwkPen Inject 12-36 Units into the skin 3 (three) times daily. On average per patient 24 unit with breakfast, 16 units with lunch, & 14-16 units with supper    . HUMALOG MIX 75/25 KWIKPEN (75-25) 100 UNIT/ML Kwikpen Inject 20 Units into the skin 2 (two) times daily.     . hydrOXYzine (ATARAX/VISTARIL) 50 MG tablet Take 50 mg by mouth every 6 (six) hours as needed for itching ((primarily taken at bedtime)).     . IRON PO Take 24 mg by mouth daily.    Marland Kitchen levothyroxine (SYNTHROID, LEVOTHROID) 75 MCG tablet Take 75 mcg by mouth daily before breakfast.     . Melatonin 1 MG TABS Take 0.5 mg by mouth at bedtime.    . metolazone (ZAROXOLYN) 2.5 MG tablet Take 1 tablet (2.5 mg total) by mouth once a week. Every Friday and as needed. 45 tablet 3  . milrinone (PRIMACOR) 20 MG/100 ML SOLN infusion  Inject 35.2875 mcg/min into the vein continuous. 100 mL 0  . mometasone (NASONEX) 50 MCG/ACT nasal spray Place 1 spray into both nostrils daily. allergies    . Multiple Minerals (CALCIUM-MAGNESIUM-ZINC) TABS Take 1 tablet by mouth daily after lunch.     . nitroGLYCERIN (NITROSTAT) 0.4 MG SL tablet Place 1 tablet (0.4 mg total) under the tongue every 5 (five) minutes as needed. For chest pain 25 tablet 3  . Omega-3 Fatty Acids (FISH OIL) 1000 MG CAPS Take 2 capsules by mouth daily.    . pantoprazole (PROTONIX) 40 MG tablet Take 40 mg by mouth 2 (two) times daily.      . polyethylene glycol powder (GLYCOLAX/MIRALAX) powder Mix 17 grams in 8 oz of water twice daily (Patient taking differently: daily. Mix 17 grams in 8 oz of water twice daily) 850 g 3  . potassium chloride SA (K-DUR,KLOR-CON) 20 MEQ tablet Take 3 tablets (60 mEq total) by mouth 2 (two) times daily. 180 tablet 3  . Propylene Glycol (SYSTANE BALANCE) 0.6 % SOLN Place 1 drop into both eyes 3 (three) times daily.    Marland Kitchen pyridOXINE (VITAMIN B-6) 100 MG tablet Take 100 mg by mouth daily after lunch.     Marland Kitchen rOPINIRole (REQUIP) 1 MG  tablet Take 1 mg by mouth at bedtime and may repeat dose one time if needed. IF RESTLESS LEGS PERSIST PATIENT MAY REPEAT ADDITIONAL DOSE    . rosuvastatin (CRESTOR) 10 MG tablet Take 10 mg by mouth at bedtime.    . Simethicone Extra Strength 125 MG CAPS Take 1 tablet by mouth daily as needed (for bloating). Reported on 02/23/2016    . spironolactone (ALDACTONE) 25 MG tablet Take 1 tablet (25 mg total) by mouth 2 (two) times daily. 60 tablet 6  . SUMAtriptan (IMITREX) 100 MG tablet Take 100 mg by mouth daily as needed for migraine.     . tiotropium (SPIRIVA) 18 MCG inhalation capsule Place 18 mcg into inhaler and inhale daily.      Marland Kitchen torsemide (DEMADEX) 20 MG tablet Take 3 tablets (60 mg total) by mouth 2 (two) times daily. 180 tablet 6  . traMADol (ULTRAM) 50 MG tablet Take 1 tablet by mouth daily as needed for severe pain.     . vitamin B-12 (CYANOCOBALAMIN) 1000 MCG tablet Take 1,000 mcg by mouth daily after lunch.     . vitamin C (ASCORBIC ACID) 500 MG tablet Take 500 mg by mouth daily after lunch.     . vitamin E 400 UNIT capsule Take 400 Units by mouth daily after lunch.      No current facility-administered medications for this visit.     PHYSICAL EXAMINATION: ECOG PERFORMANCE STATUS: 1 - Symptomatic but completely ambulatory  Vitals:   02/21/17 0937  BP: (!) 97/54  Pulse: 94  Resp: 18  Temp: (!) 97.5 F (36.4 C)   Filed Weights   02/21/17 0937  Weight: 208 lb 6.4 oz (94.5 kg)    GENERAL:alert, no distress and comfortable SKIN: skin color, texture, turgor are normal, no rashes or significant lesions EYES: normal, Conjunctiva are pink and non-injected, sclera clear OROPHARYNX:no exudate, no erythema and lips, buccal mucosa, and tongue normal  NECK: supple, thyroid normal size, non-tender, without nodularity LYMPH:  no palpable lymphadenopathy in the cervical, axillary or inguinal LUNGS: clear to auscultation and percussion with normal breathing effort HEART: regular rate &  rhythm and no murmurs and no lower extremity edema ABDOMEN:abdomen soft, non-tender and normal bowel sounds MUSCULOSKELETAL:no  cyanosis of digits and no clubbing  NEURO: alert & oriented x 3 with fluent speech, no focal motor/sensory deficits EXTREMITIES: 2+ lower extremity edema  LABORATORY DATA:  I have reviewed the data as listed   Chemistry      Component Value Date/Time   NA 134 (L) 02/18/2017 1225   NA 135 (L) 07/03/2016 1201   K 5.4 (H) 02/18/2017 1225   K 3.8 07/03/2016 1201   CL 98 (L) 02/18/2017 1225   CL 104 09/10/2014 1753   CO2 26 02/18/2017 1225   CO2 30 (H) 07/03/2016 1201   BUN 45 (H) 02/18/2017 1225   BUN 23.2 07/03/2016 1201   CREATININE 1.85 (H) 02/18/2017 1225   CREATININE 1.1 07/03/2016 1201      Component Value Date/Time   CALCIUM 10.1 02/18/2017 1225   CALCIUM 10.7 (H) 07/03/2016 1201   ALKPHOS 89 02/16/2017 2227   ALKPHOS 83 07/03/2016 1201   AST 30 02/16/2017 2227   AST 16 07/03/2016 1201   ALT 39 02/16/2017 2227   ALT 15 07/03/2016 1201   BILITOT 1.4 (H) 02/16/2017 2227   BILITOT 0.95 07/03/2016 1201       Lab Results  Component Value Date   WBC 8.8 02/18/2017   HGB 13.7 02/18/2017   HCT 42.9 02/18/2017   MCV 91.3 02/18/2017   PLT 369 02/18/2017   NEUTROABS 6.4 02/16/2017    ASSESSMENT & PLAN:  Breast cancer of lower-outer quadrant of right female breast Left Lumpectomy 07/24/16: 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 She will be set up for a PET/CT scan. The challenge will be these nodules need to be biopsied it will be very difficult because of patient has severe emphysema. If these are benign and then she can get the heart pump.   Return to clinicin one week after the  PET scan.  The PET scan is negative then we can see her once a year with survivorship clinic.  I spent 25 minutes talking to the patient of which more than half was spent in counseling and coordination of care.  No orders of the defined types were placed in this encounter.  The patient has a good understanding of the overall plan. she agrees with it. she will call with any problems that may develop before the next visit here.   Rulon Eisenmenger, MD 02/21/17

## 2017-02-21 NOTE — Assessment & Plan Note (Signed)
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

## 2017-02-21 NOTE — Progress Notes (Signed)
Pt to receive 560mls of IVF today. Pt hg 7.9, pt reluctant to get blood transfusion. Willing to stay for fluids and to come back in a week and recheck labs and follow up with MD. 22g on R hand. Pt resting comfortably in office.Written excuse note given to pt husband and daughter.

## 2017-02-24 ENCOUNTER — Ambulatory Visit (HOSPITAL_COMMUNITY)
Admission: RE | Admit: 2017-02-24 | Discharge: 2017-02-24 | Disposition: A | Discharge status: Home / Self Care | Payer: Medicare Other | Source: Ambulatory Visit | Attending: Cardiology | Admitting: Cardiology

## 2017-02-24 ENCOUNTER — Encounter (HOSPITAL_COMMUNITY): Payer: Self-pay | Admitting: Cardiology

## 2017-02-24 ENCOUNTER — Encounter: Payer: Self-pay | Admitting: Cardiology

## 2017-02-24 VITALS — BP 106/54 | HR 109 | Wt 209.8 lb

## 2017-02-24 DIAGNOSIS — Z95810 Presence of automatic (implantable) cardiac defibrillator: Secondary | ICD-10-CM | POA: Diagnosis not present

## 2017-02-24 DIAGNOSIS — I110 Hypertensive heart disease with heart failure: Secondary | ICD-10-CM | POA: Insufficient documentation

## 2017-02-24 DIAGNOSIS — I2510 Atherosclerotic heart disease of native coronary artery without angina pectoris: Secondary | ICD-10-CM

## 2017-02-24 DIAGNOSIS — Z6834 Body mass index (BMI) 34.0-34.9, adult: Secondary | ICD-10-CM | POA: Insufficient documentation

## 2017-02-24 DIAGNOSIS — Z833 Family history of diabetes mellitus: Secondary | ICD-10-CM | POA: Insufficient documentation

## 2017-02-24 DIAGNOSIS — G4733 Obstructive sleep apnea (adult) (pediatric): Secondary | ICD-10-CM

## 2017-02-24 DIAGNOSIS — E119 Type 2 diabetes mellitus without complications: Secondary | ICD-10-CM | POA: Diagnosis not present

## 2017-02-24 DIAGNOSIS — I472 Ventricular tachycardia: Secondary | ICD-10-CM | POA: Diagnosis not present

## 2017-02-24 DIAGNOSIS — I482 Chronic atrial fibrillation: Secondary | ICD-10-CM | POA: Diagnosis not present

## 2017-02-24 DIAGNOSIS — I2584 Coronary atherosclerosis due to calcified coronary lesion: Secondary | ICD-10-CM

## 2017-02-24 DIAGNOSIS — Z451 Encounter for adjustment and management of infusion pump: Secondary | ICD-10-CM | POA: Diagnosis not present

## 2017-02-24 DIAGNOSIS — F329 Major depressive disorder, single episode, unspecified: Secondary | ICD-10-CM | POA: Diagnosis not present

## 2017-02-24 DIAGNOSIS — I5023 Acute on chronic systolic (congestive) heart failure: Secondary | ICD-10-CM | POA: Diagnosis not present

## 2017-02-24 DIAGNOSIS — E785 Hyperlipidemia, unspecified: Secondary | ICD-10-CM | POA: Insufficient documentation

## 2017-02-24 DIAGNOSIS — Z7902 Long term (current) use of antithrombotics/antiplatelets: Secondary | ICD-10-CM | POA: Insufficient documentation

## 2017-02-24 DIAGNOSIS — C50912 Malignant neoplasm of unspecified site of left female breast: Secondary | ICD-10-CM | POA: Insufficient documentation

## 2017-02-24 DIAGNOSIS — Z9889 Other specified postprocedural states: Secondary | ICD-10-CM | POA: Insufficient documentation

## 2017-02-24 DIAGNOSIS — I252 Old myocardial infarction: Secondary | ICD-10-CM | POA: Insufficient documentation

## 2017-02-24 DIAGNOSIS — I10 Essential (primary) hypertension: Secondary | ICD-10-CM

## 2017-02-24 DIAGNOSIS — E669 Obesity, unspecified: Secondary | ICD-10-CM | POA: Insufficient documentation

## 2017-02-24 DIAGNOSIS — I5022 Chronic systolic (congestive) heart failure: Secondary | ICD-10-CM

## 2017-02-24 DIAGNOSIS — G2581 Restless legs syndrome: Secondary | ICD-10-CM | POA: Insufficient documentation

## 2017-02-24 DIAGNOSIS — M810 Age-related osteoporosis without current pathological fracture: Secondary | ICD-10-CM | POA: Diagnosis not present

## 2017-02-24 DIAGNOSIS — F419 Anxiety disorder, unspecified: Secondary | ICD-10-CM | POA: Insufficient documentation

## 2017-02-24 DIAGNOSIS — Z801 Family history of malignant neoplasm of trachea, bronchus and lung: Secondary | ICD-10-CM | POA: Insufficient documentation

## 2017-02-24 DIAGNOSIS — Z87891 Personal history of nicotine dependence: Secondary | ICD-10-CM | POA: Insufficient documentation

## 2017-02-24 DIAGNOSIS — Z8049 Family history of malignant neoplasm of other genital organs: Secondary | ICD-10-CM | POA: Insufficient documentation

## 2017-02-24 DIAGNOSIS — Z7901 Long term (current) use of anticoagulants: Secondary | ICD-10-CM | POA: Diagnosis not present

## 2017-02-24 DIAGNOSIS — K589 Irritable bowel syndrome without diarrhea: Secondary | ICD-10-CM | POA: Insufficient documentation

## 2017-02-24 DIAGNOSIS — K219 Gastro-esophageal reflux disease without esophagitis: Secondary | ICD-10-CM | POA: Insufficient documentation

## 2017-02-24 DIAGNOSIS — Z794 Long term (current) use of insulin: Secondary | ICD-10-CM | POA: Insufficient documentation

## 2017-02-24 DIAGNOSIS — I427 Cardiomyopathy due to drug and external agent: Secondary | ICD-10-CM | POA: Diagnosis not present

## 2017-02-24 DIAGNOSIS — Z800 Family history of malignant neoplasm of digestive organs: Secondary | ICD-10-CM | POA: Insufficient documentation

## 2017-02-24 DIAGNOSIS — I480 Paroxysmal atrial fibrillation: Secondary | ICD-10-CM

## 2017-02-24 DIAGNOSIS — Z8249 Family history of ischemic heart disease and other diseases of the circulatory system: Secondary | ICD-10-CM | POA: Insufficient documentation

## 2017-02-24 DIAGNOSIS — J449 Chronic obstructive pulmonary disease, unspecified: Secondary | ICD-10-CM | POA: Diagnosis not present

## 2017-02-24 LAB — BASIC METABOLIC PANEL
Anion gap: 8 (ref 5–15)
BUN: 41 mg/dL — AB (ref 6–20)
CALCIUM: 9.1 mg/dL (ref 8.9–10.3)
CO2: 30 mmol/L (ref 22–32)
Chloride: 90 mmol/L — ABNORMAL LOW (ref 101–111)
Creatinine, Ser: 1.45 mg/dL — ABNORMAL HIGH (ref 0.44–1.00)
GFR calc Af Amer: 44 mL/min — ABNORMAL LOW (ref >60)
GFR, EST NON AFRICAN AMERICAN: 38 mL/min — AB (ref >60)
GLUCOSE: 340 mg/dL — AB (ref 65–99)
Potassium: 3.6 mmol/L (ref 3.5–5.1)
Sodium: 128 mmol/L — ABNORMAL LOW (ref 135–145)

## 2017-02-24 LAB — CBC
HCT: 40.5 % (ref 36.0–46.0)
Hemoglobin: 13.2 g/dL (ref 12.0–15.0)
MCH: 29.5 pg (ref 26.0–34.0)
MCHC: 32.6 g/dL (ref 30.0–36.0)
MCV: 90.6 fL (ref 78.0–100.0)
Platelets: 292 10*3/uL (ref 150–400)
RBC: 4.47 MIL/uL (ref 3.87–5.11)
RDW: 17 % — AB (ref 11.5–15.5)
WBC: 7.8 10*3/uL (ref 4.0–10.5)

## 2017-02-24 LAB — MAGNESIUM: MAGNESIUM: 2.1 mg/dL (ref 1.7–2.4)

## 2017-02-24 LAB — COOXEMETRY PANEL
CARBOXYHEMOGLOBIN: 1.6 % — AB (ref 0.5–1.5)
METHEMOGLOBIN: 0.7 % (ref 0.0–1.5)
O2 SAT: 50.3 %
TOTAL HEMOGLOBIN: 13.4 g/dL (ref 12.0–16.0)

## 2017-02-24 NOTE — Progress Notes (Signed)
Patient ID: JANNELLE NOTARO, female   DOB: 02-13-1956, 61 y.o.   MRN: [id number]    Advanced Heart Failure Clinic Note   Endocrinologist: Dr Loanne Drilling  Oncologist: Dr. Humphrey Rolls PCP: [name], NP ([place]) Primary HF MD: Dr Haroldine Laws  HPI Ms. Fincher is a 61 year old female with history of R breast cancer (2005, 2008), L beast cancer (2017) obesity, HTN, DM, IBS, HLD, OSA, chemo induced cardiomyopathy s/p ICD and chronic systolic HF.    She had chemo induced cardiomyopathy.  Her EF has fallen from 45% per Cardiac MRI in July 2010 to 20-25% in 10/2012. S/P single chamber ICD (St Jude) implantation on 11/10/12 by Dr. Rayann Heman.    Admitted 08/19/15 with chest pain. Had [facility] ([place]) with mild CAD and increased filling pressures.  Admitted 12/21/15 with increased dyspnea and chest pain. Additionally, a new breast lump was discovered, with outpatient follow up arranged. She diuresed 15 lbs. Discharge weight 213 lbs.  Admitted 02/2016 with GIB. 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 Ardis Hughs. Eliquis restarted without problem.   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 Had lumpectomy 07/24/16 with Dr. Dalbert Batman. Completed XRT. No chemo planned.  Admitted a couple times in July with HF, A fib, and low output heart failure. Also unsuccessful DC-CV. Now chronic a fib. Left on milrinone 0.375 mcg/kg/min.   Today she returns for follow up. Feeling much better. Given IV lasix at last visit and milrinone up to 0.5 mcg/kg/min. Weight up 4 more lbs by home scale. Weight up 5 lbs by our scale. Mild nausea but not as bad. Eating helps. Appetite has increased greatly. Taking all medications as directed.  Watching her sodium and monitoring fluid intake. Has swelling up into her thighs. Denies SOB walking at home or with ADLs.   CPX 12/23/16: FVC 2.15 (66%)    FEV1 1.57 (62%)     FEV1/FVC 73% (92%)     MVV 58 (62%)      Resting HR: 93 Peak HR: 116  (73% age  predicted max HR) BP rest: 88/60 BP peak: 104/60  Peak VO2: 9.2 (52% predicted peak VO2) VE/VCO2 slope: 51 OUES: 1.07 Peak RER: 0.99 Ventilatory Threshold: 8.0 (45% predicted or measured peak VO2) VE/MVV: 71% O2pulse: 8  (73% predicted O2pulse)  CPX 09/11/2015  Peak VO2: 12.7 (73% predicted peak VO2) VE/VCO2 slope: 32.3 OUES: 1.62 Peak RER: 1.08    RHC/LCH  08/20/2014   Mid Cx lesion, 30% stenosed.  3rd Mrg lesion, 30% stenosed. Findings: RA = 12 RV = 55/6/12 PA = 54/21 (34) PCW = 22 Ao 93/58 (71) LV 106/8/26 Fick cardiac output/index = 4.2/2.1 PVR = 2.8 SVR = 1115 FA sat = 93% PA sat = 54%, 53% Assessment: 1. Very mild CAD 2. Mild to moderately increased filling pressures 3. Severe LV dysfunction due to NICM EF 20%medications.   [place] on 01/30/12 showed decompensated HF with low cardiac output consistent with cardiogenic shock.  [place] 6/27  RA = 20  RV = 55/13/22  PA =56/34 (44)  PCW = 28  Fick cardiac output/index = 3.1/1.5  PVR = 5.3 [place]  FA sat = 91%  PA sat = 42%, 48%  SVC sat = 46%  Echo 08/12/11: Left ventricle: mildly dilated, EF 20% to 25%. Diffuse hypokinesis. Doppler parameters are consistent with restrictive physiology, indicative of decreased left ventricular diastolic compliance and/or increased left atrial pressure. Left atrium was moderately dilated. Right atrium  mildly dilated. 04/09/12 ECHO EF 30-35% LV mildly dilated  Hyopkinesis 10/07/12 ECHO EF 20-25% RV ok. 03/31/14: ECHO EF 30-35%, severe HK inferior/inferoseptal walls, mild MR, LA mod dilated, RA mildly dilated 06/28/2015: ECHO EF 25% RV mildly dilated. Severe HK inferior wall.  08/21/2015: Echo EF 30-35% Grade III DD  Myoview in 05/2010 EF 59%. No ischemia.   Review of systems complete and found to be negative unless listed in HPI.    Social History   Social History  . Marital status: Divorced    Spouse name: N/A  . Number of children: 1  . Years of education: N/A   Occupational  History  . Disabled Unemployed   Social History Main Topics  . Smoking status: Former Smoker    Packs/day: 1.50    Years: 33.00    Types: Cigarettes    Quit date: 04/12/2001  . Smokeless tobacco: Never Used     Comment: heaviest - 2.5- 3PPD x 3 years   . Alcohol use No  . Drug use: No  . Sexual activity: Not Currently   Other Topics Concern  . Not on file   Social History Narrative   Lives with mother and brother Philis Nettle         Family History  Problem Relation Age of Onset  . Diabetes Mellitus II Mother   . Lung cancer Mother   . Congestive Heart Failure Mother   . Coronary artery disease Father        CABG x 3  . Hypertension Father   . Diabetes Mellitus II Father   . Breast cancer Cousin 67       maternal first cousin  . Colon cancer Cousin 41       maternal first cousin  . Uterine cancer Cousin        dx in her 1s  . Colon cancer Maternal Aunt        dx in her late 37s  . Uterine cancer Maternal Aunt 66  . Lung cancer Maternal Aunt   . Uterine cancer Maternal Aunt 38  . Colon cancer Maternal Aunt 61  . Breast cancer Maternal Grandmother 26  . Uterine cancer Maternal Aunt 48  . Uterine cancer Cousin        dx in her 50s  . Uterine cancer Cousin 49    Past Medical History:  Diagnosis Date  . AICD (automatic cardioverter/defibrillator) present    st jude  . Anxiety   . 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 Asthma   . Axillary mass, left 10/17/2016  . Breast cancer ([place]) 2005, 2017  . CHF (congestive heart failure) ([place])   . COPD (chronic obstructive pulmonary disease) ([place])   . Depression    followed by a psychiatrist  . DYSLIPIDEMIA 05/01/2009  . Dyspnea   . Dysrhythmia    atrial fibrillation  . Family history of breast cancer   . Family history of colon cancer   . Family history of uterine cancer   . [place] 10/17/2016  . Gastroparesis   . GERD (gastroesophageal reflux disease)   . Headache   . History of breast cancer 2006   with return in 2008, s/p  mastectomy, XRT and chemotherapy  . History of external beam radiation therapy 09/25/16-11/13/16   left breast treated to 50.4 Gy in 28 fractions, boost 12 Gy in 6 fractions  . Hypertension   . 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 IBS (irritable bowel syndrome)   . ICD (implantable cardiac defibrillator) in place 11/10/2012  ICD (11/2012)  . Malignant neoplasm of lower-inner quadrant of left female breast ([place]) 06/26/2016  . Myocardial infarction ([place])   . OBESITY 07/24/2007  . OBSTRUCTIVE SLEEP APNEA 07/24/2007   with CPAP compliance  . Osteoarthritis   . Persistent atrial fibrillation ([place]) 09/2014  . Personal history of radiation therapy    current  . Presence of permanent cardiac pacemaker   . RESTLESS LEG SYNDROME 07/24/2007  . SYSTOLIC HEART FAILURE, CHRONIC 01/05/2009   a. chemo induced cardiomyopathy b. cMRI (02/2009) EF 45% c. Myoview 05/2010: EF 59%, no ischemia d. RHC (01/2012) RA 20, RV 55/13/22, PA 56/34 (44), PCWP 28, Fick CO/CI: 3.1 / 1.5, PVR 5.3 WU, PA sat 42% & 48% e. ECHO (10/2012) EF 20-25%, diff HK, MV calcified annulus f. ECHO (03/2014) EF 30-35%, sev HK, inferior/inferoseptal walls, mild MR    Current Outpatient Prescriptions  Medication Sig Dispense Refill  . acetaminophen (TYLENOL) 650 MG CR tablet Take 1,300 mg by mouth every 8 (eight) hours as needed for pain.    Marland Kitchen albuterol (PROVENTIL HFA) 108 (90 BASE) MCG/ACT inhaler Inhale 2 puffs into the lungs every 6 (six) hours as needed. For shortness of breath    . ALPRAZolam (XANAX) 0.5 MG tablet Take 0.25 mg by mouth 4 (four) times daily as needed for anxiety.     Marland Kitchen amiodarone (PACERONE) 200 MG tablet Take 1 tablet (200 mg total) by mouth 2 (two) times daily. 120 tablet 3  . apixaban (ELIQUIS) 5 MG TABS tablet Take 1 tablet (5 mg total) by mouth 2 (two) times daily. 180 tablet 3  . baclofen (LIORESAL) 20 MG tablet Take 20 mg by mouth 2 (two) times daily as needed for muscle spasms.    . busPIRone (BUSPAR) 30 MG tablet Take  30 mg by mouth 2 (two) times daily.     . Calcium Carb-Cholecalciferol (CALCIUM 600+D3) 600-800 MG-UNIT TABS Take by mouth daily.    . Cholecalciferol (VITAMIN D) 2000 units CAPS Take 1 capsule by mouth daily.    . digoxin (LANOXIN) 0.125 MG tablet Take 1 tablet (0.125 mg total) by mouth every other day. 45 tablet 3  . docusate sodium (COLACE) 100 MG capsule Take 3 capsules (300 mg total) by mouth at bedtime. 10 capsule 0  . doxycycline (VIBRA-TABS) 100 MG tablet Take 1 tablet (100 mg total) by mouth every 12 (twelve) hours. 13 tablet 0  . DULoxetine (CYMBALTA) 30 MG capsule Take 30 mg by mouth daily.    . fluconazole (DIFLUCAN) 200 MG tablet Take 1 tablet (200 mg total) by mouth daily. 2 tablet 0  . Garlic 540 MG CAPS Take 500 mg by mouth daily after lunch.     . Glucos-Chondroit-Hyaluron-MSM (GLUCOSAMINE CHONDROITIN JOINT PO) Take 1 tablet by mouth daily.    Marland Kitchen HUMALOG KWIKPEN 100 UNIT/ML KiwkPen Inject 12-36 Units into the skin 3 (three) times daily. On average per patient 24 unit with breakfast, 16 units with lunch, & 14-16 units with supper    . HUMALOG MIX 75/25 KWIKPEN (75-25) 100 UNIT/ML Kwikpen Inject 20 Units into the skin 2 (two) times daily.     . hydrOXYzine (ATARAX/VISTARIL) 50 MG tablet Take 50 mg by mouth every 6 (six) hours as needed for itching ((primarily taken at bedtime)).     . IRON PO Take 24 mg by mouth daily.    Marland Kitchen levothyroxine (SYNTHROID, LEVOTHROID) 75 MCG tablet Take 75 mcg by mouth daily before breakfast.     . Melatonin 1 MG TABS  Take 0.5 mg by mouth at bedtime.    . metolazone (ZAROXOLYN) 2.5 MG tablet Take 1 tablet (2.5 mg total) by mouth once a week. Every Friday and as needed. 45 tablet 3  . milrinone (PRIMACOR) 20 MG/100 ML SOLN infusion Inject 35.2875 mcg/min into the vein continuous. 100 mL 0  . mometasone (NASONEX) 50 MCG/ACT nasal spray Place 1 spray into both nostrils daily. allergies    . Multiple Minerals (CALCIUM-MAGNESIUM-ZINC) TABS Take 1 tablet by mouth  daily after lunch.     . Omega-3 Fatty Acids (FISH OIL) 1000 MG CAPS Take 2 capsules by mouth daily.    . pantoprazole (PROTONIX) 40 MG tablet Take 40 mg by mouth 2 (two) times daily.      . polyethylene glycol powder (GLYCOLAX/MIRALAX) powder Mix 17 grams in 8 oz of water twice daily (Patient taking differently: daily. Mix 17 grams in 8 oz of water twice daily) 850 g 3  . potassium chloride SA (K-DUR,KLOR-CON) 20 MEQ tablet Take 3 tablets (60 mEq total) by mouth 2 (two) times daily. 180 tablet 3  . Propylene Glycol (SYSTANE BALANCE) 0.6 % SOLN Place 1 drop into both eyes 3 (three) times daily.    Marland Kitchen pyridOXINE (VITAMIN B-6) 100 MG tablet Take 100 mg by mouth daily after lunch.     Marland Kitchen rOPINIRole (REQUIP) 1 MG tablet Take 1 mg by mouth at bedtime and may repeat dose one time if needed. IF RESTLESS LEGS PERSIST PATIENT MAY REPEAT ADDITIONAL DOSE    . rosuvastatin (CRESTOR) 10 MG tablet Take 10 mg by mouth at bedtime.    . Simethicone Extra Strength 125 MG CAPS Take 1 tablet by mouth daily as needed (for bloating). Reported on 02/23/2016    . spironolactone (ALDACTONE) 25 MG tablet Take 1 tablet (25 mg total) by mouth 2 (two) times daily. 60 tablet 6  . SUMAtriptan (IMITREX) 100 MG tablet Take 100 mg by mouth daily as needed for migraine.     . tiotropium (SPIRIVA) 18 MCG inhalation capsule Place 18 mcg into inhaler and inhale daily.      Marland Kitchen torsemide (DEMADEX) 20 MG tablet Take 3 tablets (60 mg total) by mouth 2 (two) times daily. 180 tablet 6  . traMADol (ULTRAM) 50 MG tablet Take 1 tablet by mouth daily as needed for severe pain.     . vitamin B-12 (CYANOCOBALAMIN) 1000 MCG tablet Take 1,000 mcg by mouth daily after lunch.     . vitamin C (ASCORBIC ACID) 500 MG tablet Take 500 mg by mouth daily after lunch.     . vitamin E 400 UNIT capsule Take 400 Units by mouth daily after lunch.     . nitroGLYCERIN (NITROSTAT) 0.4 MG SL tablet Place 1 tablet (0.4 mg total) under the tongue every 5 (five) minutes as  needed. For chest pain (Patient not taking: Reported on 02/24/2017) 25 tablet 3   No current facility-administered medications for this encounter.    Vitals:   02/24/17 0938  BP: (!) 106/54  Pulse: (!) 109  SpO2: 100%  Weight: 209 lb 12 oz (95.1 kg)   Wt Readings from Last 3 Encounters:  02/24/17 209 lb 12 oz (95.1 kg)  02/21/17 208 lb 6.4 oz (94.5 kg)  02/19/17 203 lb 12.8 oz (92.4 kg)     PHYSICAL EXAM: General: Well appearing. No resp difficulty. HEENT: Normal Neck: Supple. JVP to jaw. Carotids 2+ bilat; no bruits. No thyromegaly or nodule noted. Cor: PMI nondisplaced. RRR, No M/G/R noted  Lungs: CTAB, normal effort. Abdomen: Soft, non-tender, non-distended, no HSM. No bruits or masses. +BS  Extremities: No cyanosis, clubbing, or rash. 2+ edema to thighs.  Neuro: Alert & orientedx3, cranial nerves grossly intact. moves all 4 extremities w/o difficulty. Affect pleasant   ASSESSMENT & PLAN:  1) Chronic systolic HF, NICM thought to be chemo induced, s/p ST Jude  ICD.ECHO 06/28/2015  EF ~20%. RV mildly dilated. Echo 1/17 30-35%. Ech 5/18 EF 15% RV ok - NYHA III symptoms today.  - Despite symptomatic improvement, weight up and peripheral edema worse. Will give 80 mg IV lasix tomorrow with 2.5 mg metolazone via AHC. She will then repeat her metolazone as scheduled Friday.  - Continue milrinone 0.5 mcg/kg/min. Coox marginal.  - Continue torsemide 60 mg twice a day and 2.5 mg metolazone every Friday. BMET pending. - Continue spiro 25 mg twice a day.  - Continue digoxin 0.125 mg daily .   - Reinforced fluid restriction to < 2 L daily, sodium restriction to less than 2000 mg daily, and the importance of daily weights.   2) Chronic  Afib - S/P DC-CV 02/02/2017 and 02/06/2017 which was unsuccessful.  Continue Eliquis 5 mg BID.  Continue amio 200 mg twice a day. Rate controlled.  - No bleeding. CBC stable.  - CHADsVASC 4 (HTN, CAD, HF, gender) 3) OSA - Continue CPAP.   4) HLD -  Followed by PCP. No change.  5) CAD - mild nonobstructive CAD on 2016 cath.Will need repeat cath as part of VAD w/u.  - No S/S ischemia. Continue statin, BB, and ACE-I. No change.  6) NSVT - Continue amio 200 mg twice a day. . Needs yearly eye exam. TSH stable 09/2016. Stable. No change.  7) Breast Cancer, bialterally - Most recent left breast CA  s/p lumpectomy and Radiation 07/2016. Follow up with Dr Lindi Adie next week. PET scan pending.   Coox marginal but symptomatically improved. Will give IV lasix + metolazone tomorrow for edema. Continue current torsemide and once weekly metolazone.   Shirley Friar, PA-C  10:23 AM  Greater than 50% of the 25 minute visit was spent in counseling/coordination of care regarding disease state education, sliding scale diuretics, salt/fluid restriction, and discussion of plan with MD.

## 2017-02-24 NOTE — Progress Notes (Signed)
LVAD Initial Psychosocial Screening  Date/Time Initiated:  02/06/2017 3pm Referral Source:  VAD Coordinator Referral Reason:  LVAD Implantation Source of Information:  Pt, [name] (sister), chart review  Demographics Name:  [name] Address:  [address] [place] [address] Home phone:  N/[name]    Cell: [phone] Marital Status:  Divorced  Faith:  Catholic Primary Language:  English SS:   [id number]  DOB:  03/02/1956  Medical & Follow-up Adherence to Medical regimen/INR checks:  compliant Medication adherence:  compliant Physician/Clinic Appointment Attendance:  compliant   Advance Directives: Do you have [name] Living Will or Medical POA?  Yes- "need to get it notarized" Discussed opportunities at [facility] available to patient. Would you like to complete [name] Living Will and Medical POA prior to surgery?  n/a Do you have Goals of Care?  yes Have you had [name] consult with the Palliative Care Team at [facility]? yes  Psychological Health Appearance:  Dressed in hospital gown Mental Status:  Alert and oriented  Eye Contact:  good Thought Content:  Clear and relevant Speech:  clear Mood:  Good spirits Affect:  positive Insight:  Appropriate under the circumstances Judgement: sound Interaction Style:  Talkative and appropriate  Family/Social Information Who lives in your home? Name:   Relationship:   Lives alone Other family members/support persons in your life? Name:   Relationship:   [name] Sister [name] Daughter [name]  Brother- neighbor  Caregiving Needs Who is the primary caregiver? [name] Health status:  "perfect'  Do you drive?  yes Do you work?  no Physical Limitations:  none Do you have other care giving responsibilities?  no Contact number: [phone]  Who is the secondary caregiver? [name] status:  good Do you drive?  yes Do you work?  Yes (12 hr shifts) Physical Limitations:  none Do you have other care giving  responsibilities?  no Contact number:  [phone]  Home Environment/Personal Care Do you have reliable phone service?  yes If so, what is the number?  [phone] Do you own or rent your home? own Number of steps into the home? 4 steps  How many levels in the home?  One level Assistive devices in the home? none Electrical needs for LVAD (3 prong outlets)?  yes Second hand smoke exposure in the home?  none Travel distance from [facility]?  45 minutes Self-care: independent Ambulation:  independent  Community Are you active with community agencies/resources/homecare? Yes- [facility] Are you active in [name] church, synagogue, mosque or other faith based community? No What other sources do you have for spiritual support? Own Bible Are you active in any clubs or social organizations? none What do you do for fun?  Hobbies?  Interests? Walking on beach and woods/ play with great nieces and nephews  Education/Work Information What is the last grade of school you completed? 12th / 1 yr college Preferred method of learning?  Hands on Do you have any problems with reading or writing? no   Are you currently employed?  no  When were you last employed? 2006  Name of employer? 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 Many years If you are not working, do you plan to return to work after VAD surgery? no  Did you serve in the [employer]?  If so, what branch?  no  Financial Information What is your source of income?  Social Security Do you  have difficulty meeting your monthly expenses?  yes If yes, which ones? "not one specifically" How do you cope with this? Prayer and sister states "there's nothing one wouldn't do for the other" Can you budget for the monthly cost for dressing supplies post procedure?  Yes  Primary Health insurance:  [employer] medicare Complete  Secondary Insurance: n/[name] Prescription plan: AARP What  are your prescription co-pays? varies Do you use mail order for your prescriptions?  yes Have you ever had to refuse medication due to cost?  yes Have you applied for Medicaid?  no Have you applied for Social Security Disability (SSI)  [name] Briefly describe why you are here for evaluation: Patient shared she was diagnosed with breat cancer in 2005 and had chemo and radiation which compromised her heart. Do you have [name] PCP or other medical provider? [name], Joanne A, NP Are you able to complete your ADL's? yes Do you have [name] history of trauma, physical, emotional, or sexual abuse? none Do you have any family history of heart problems? mother Do you smoke now or past usage? Yes- 30 year hx   Quit date: 2002 Do you drink alcohol now or past usage? Past but only socially    Are you currently using illegal drugs or misuse of medication or past usage?  never Have you ever been treated for substance abuse? no        Mental Health History How have you been feeling in the past year? tired Have you ever had any problems with depression, anxiety or other mental health issues? Patient reports she has had "situational depression"  Do you see [name] counselor, psychiatrist or therapist?  In 2005, went to [facility] and referred to psychologist who she saw for 10 years. If you are currently experiencing problems are you interested in talking with [name] professional? No- "it's managed" Have you or are you taking medications for anxiety/depression or any mental health concerns?  Yes Current Medications: Buspar/ Cymbalta/ Xanax prn What are your coping strategies under stressful situations? Prayer, God and my sister Are there any other stressors in your life? "little everyday things" Have you had any past or current thoughts of suicide? none How many hours do you sleep at night? 8 hrs How is your appetite? Eating better here at the hospital Would you be interested in attending the LVAD  support group? yes  PHQ2 Depression Scale: 2 PHQ9 Depression scale (if positive PHQ2 screen):     Legal Do you currently have any legal issues/problems?  no Have you had any legal issues/problems in the past?  no Do you have [name] Durable POA?  no  Plan for VAD Implementation Do you know and understand what happens during the VAD surgery? Patient verbalizes understanding of surgery and able to describe details. Patient reports she saw videos on the Internet. What do you know about the risks and side effect associated with VAD surgery? Patient verbalizes understanding of risks (infection, stroke and death) Explain what will happen right after surgery:   Patient verbalizes understanding of OR to ICU and will be intubated. What is your plan for transportation for the first 8 weeks post-surgery? (Patients are not recommended to drive post-surgery for 8 weeks)  Driver:    Joanne Low Do you have airbags in your vehicle?  Yes There is [name] risk of discharging the device if the airbag were to deploy. What do you know about your diet post-surgery?   Herat Healthy How do you plan to  monitor your medications, current and future?  self pour into pill boxes  How do you plan to complete ADL's post-surgery?  sister Will it be difficult to ask for help from your caregivers?  no  Please explain what you hope will be improved about your life as [name] result of receiving the LVAD? Will be able to do the things I used to do. Please tell me your biggest concern or fear about living with the LVAD?  Power going out How do you cope with your concerns and fears?  prayer Please explain your understanding of how their body will change?   Patient describes changes and driveline exit site Are you worried about these changes? "at least I will be walking" Do you see any barriers to your surgery or follow-up? none  Understanding of LVAD Patient states understanding of the following: Surgical procedures and risks, Electrical need  for LVAD (3 prong outlets), Safety precautions with LVAD (water, etc.), LVAD daily self-care (dressing changes, computer check, extra supplies), Outpatient follow up (LVAD clinic appts, monitoring blood thinners) and Need for Emergency Planning  Discussed and Reviewed with Patient and Caregiver  Patient's current level of motivation to prepare for LVAD: Ready Patient's present Level of Consent for LVAD:  YES!     Education provided to patient/family/caregiver:   Caregiver role and responsibiltiy, Financial planning for LVAD, Role of Clinical Education officer, museum and Signs of Depression and Anxiety   Discussed and Reviewed with Patient and Caregiver  Caregiver questions Please explain what you hope will be improved about your life and loved one's life as [name] result of receiving the LVAD?  "she will be able to spend more time at my house awake and go to [facility]" What is your biggest concern or fear about caregiving with an LVAD patient?  none What is your plan for availability to provide care 24/7 x2 weeks post op and dressing changes ongoing?  "not an issue- will be there 24/7" Who is the relief/backup caregiver and what is their availability?  [name]- daughter Preferred method of learning? Hands on  Do you drive?  yes How do you handle stressful situations?   "roll my eyes and walk off" Do you think you can do this? Yes Is there anything that concerns about caregiving?  No Do you provide caregiving to anyone else? No  Caregiver's current level of motivation to prepare for LVAD: Ready Caregiver's present level of consent for LVAD: Ready  Clinical Interventions Needed:    CSW will monitor signs and symptoms of depression and offer supportive intervention throughout recovery and ongoing post op. CSW will encourage attendance at the LVAD Support Group and encourage LVAD Buddy to assist with adjustment to life with LVAD for both patient and caregiver. CSW will follow up with current counselor to  encourage continued support and identify any concerns or barriers to care from current counselor.  CSW discussed financial concerns and will monitor and be available as needed. Patient and caregiver reports able to manage at the moment and cover expenses.   Clinical Impressions/Recommendations:   Patient is [name] 61 yo female who is divorced and has one daughter [name]. Patient has [name] very supportive sister [name] who is at her side and will be primary caregiver during recovery. Patient reports she is compliant with medical regimen and denies any concerns with follow up care needed for LVAD implantation. Patient states she has Advanced Directives but waiting on notary. Patient has supportive sister and daughter who will be primary and  back up caregivers. Patient lives alone although will stay with her sister [name] for 4-6 weeks or as long as necessary for her to be independent to return home. Patient reports strong faith and reads the bible and prays as [name] coping mechanism.  She worked for [name] for many years and is now retired receiving SSD benefits. She acknowledges struggles with finances but has support of family. Her sister stated "there's nothing one wouldn't do for the other" She enjoys walking on the beach and playing with her nieces and nephews. Patient denies any trauma or any abuse. She admits to smoking tobacco for 30+ years and quit in 2002. She used alcohol socially but nothing recently. She denies any substance use. Patient does admit to history of depression and currently in counseling and on medications. Patient denies any additional assistance at this time and states "it's managed". Patient appears to have [name] positive outlook on VAD surgery and recovery as well as life with VAD. Patient agreeable to attend LVAD Support Group and appears to be motivated for implant and life post VAD surgery. CSW recommends patient for LVAD implant. CSW will follow up post op with mentioned interventions  and support. Joanne Sarna, LCSW, [place]    Joanne Liv, LCSW

## 2017-02-24 NOTE — Patient Instructions (Signed)
IV Lasix at home tomorrow  Take Metolazone tomorrow with IV Lasix  Keep appointment with Dr Haroldine Laws on Friday 03/07/17

## 2017-02-25 ENCOUNTER — Other Ambulatory Visit: Payer: Self-pay | Admitting: *Deleted

## 2017-02-25 DIAGNOSIS — Z451 Encounter for adjustment and management of infusion pump: Secondary | ICD-10-CM | POA: Diagnosis not present

## 2017-02-25 DIAGNOSIS — I5023 Acute on chronic systolic (congestive) heart failure: Secondary | ICD-10-CM | POA: Diagnosis not present

## 2017-02-25 DIAGNOSIS — Z7901 Long term (current) use of anticoagulants: Secondary | ICD-10-CM | POA: Diagnosis not present

## 2017-02-25 NOTE — Patient Outreach (Signed)
[facility]) Care Management  02/25/2017  [name] Jul 23, 1956 [id number]   Transition of care call  Spoke with patient reports she thinks that she over did it on yesterday with over the running around she had to do, states she has to remind herself that she has rest between activities. Patient reports she does have swelling in her legs and has been wearing support hose, they are currently off now and its a lot of work to put back on she will ask HHRN to assist today.  Patient discussed she continues to keep track of her fluid intake/output, sodium and carbohydrate intake and sticking to limits. Filed Weights   02/25/17 1112  Weight: 207 lb 6.4 oz (94.1 kg)  Patient reports yesterday weight was 205.8, has planned home health RN visit on today to administer IV lasix and take metolazone  as planned from Heart failure clinic visit on yesterday.  Patient has not monitored blood sugar yet on today, she did discuss blood sugar drop over the weekend to 72, back up to 84 after treatment , she is now taking 20 units of 75/25 2 times daily, she continues keep record of daily blood sugar readings. Patient states she is eating a little less due to having some nausea at times , she notices it more when she has fluid gain. Patient discussed she is eager to get schedule of PET scan to help determine eligibility of LVAD.   Discussed transition of care home visit, patient agreeable to scheduling.  Plan Will plan transition of care home visit in the next week.  Patient will continue to weigh daily, notify MD of weight gains of 2 pounds in a day,  5 in a week and unresolved or worsening of symptoms.   Joylene Draft, RN, [facility] [phone]- Mobile [phone]- Toll Free Main Office

## 2017-02-26 ENCOUNTER — Telehealth (HOSPITAL_COMMUNITY): Payer: Self-pay

## 2017-02-26 NOTE — Telephone Encounter (Signed)
Patient reports increased UOP today after IV lasix and metolazone yesterday per Jonni [facility]'s order. States she has not lost any weight but has noticed today she is urinating more and urine is more clear today than yesterday. Fells tired but otherwise no complaints. Patient reports strict intake restrictions of sodium and fluid and keeps a log of it all. Advised to rest today with legs elevated and call tomorrow to update on weight. Aware and agreeable.  Renee Pain, RN

## 2017-02-27 ENCOUNTER — Telehealth (HOSPITAL_COMMUNITY): Payer: Self-pay

## 2017-02-27 DIAGNOSIS — Z451 Encounter for adjustment and management of infusion pump: Secondary | ICD-10-CM | POA: Diagnosis not present

## 2017-02-27 DIAGNOSIS — Z7901 Long term (current) use of anticoagulants: Secondary | ICD-10-CM | POA: Diagnosis not present

## 2017-02-27 IMAGING — DX DG CHEST 2V
2 series · 2 of 2 positions shown · non-contrast
Comparison: 12/20/2015

CLINICAL DATA: Shortness of breath, atrial fibrillation, coronary
artery disease post MI x 2, CHF, breast cancer post RIGHT
mastectomy, hypertension, diabetes mellitus, COPD, asthma

EXAM:
CHEST  2 VIEW

[chest pa]
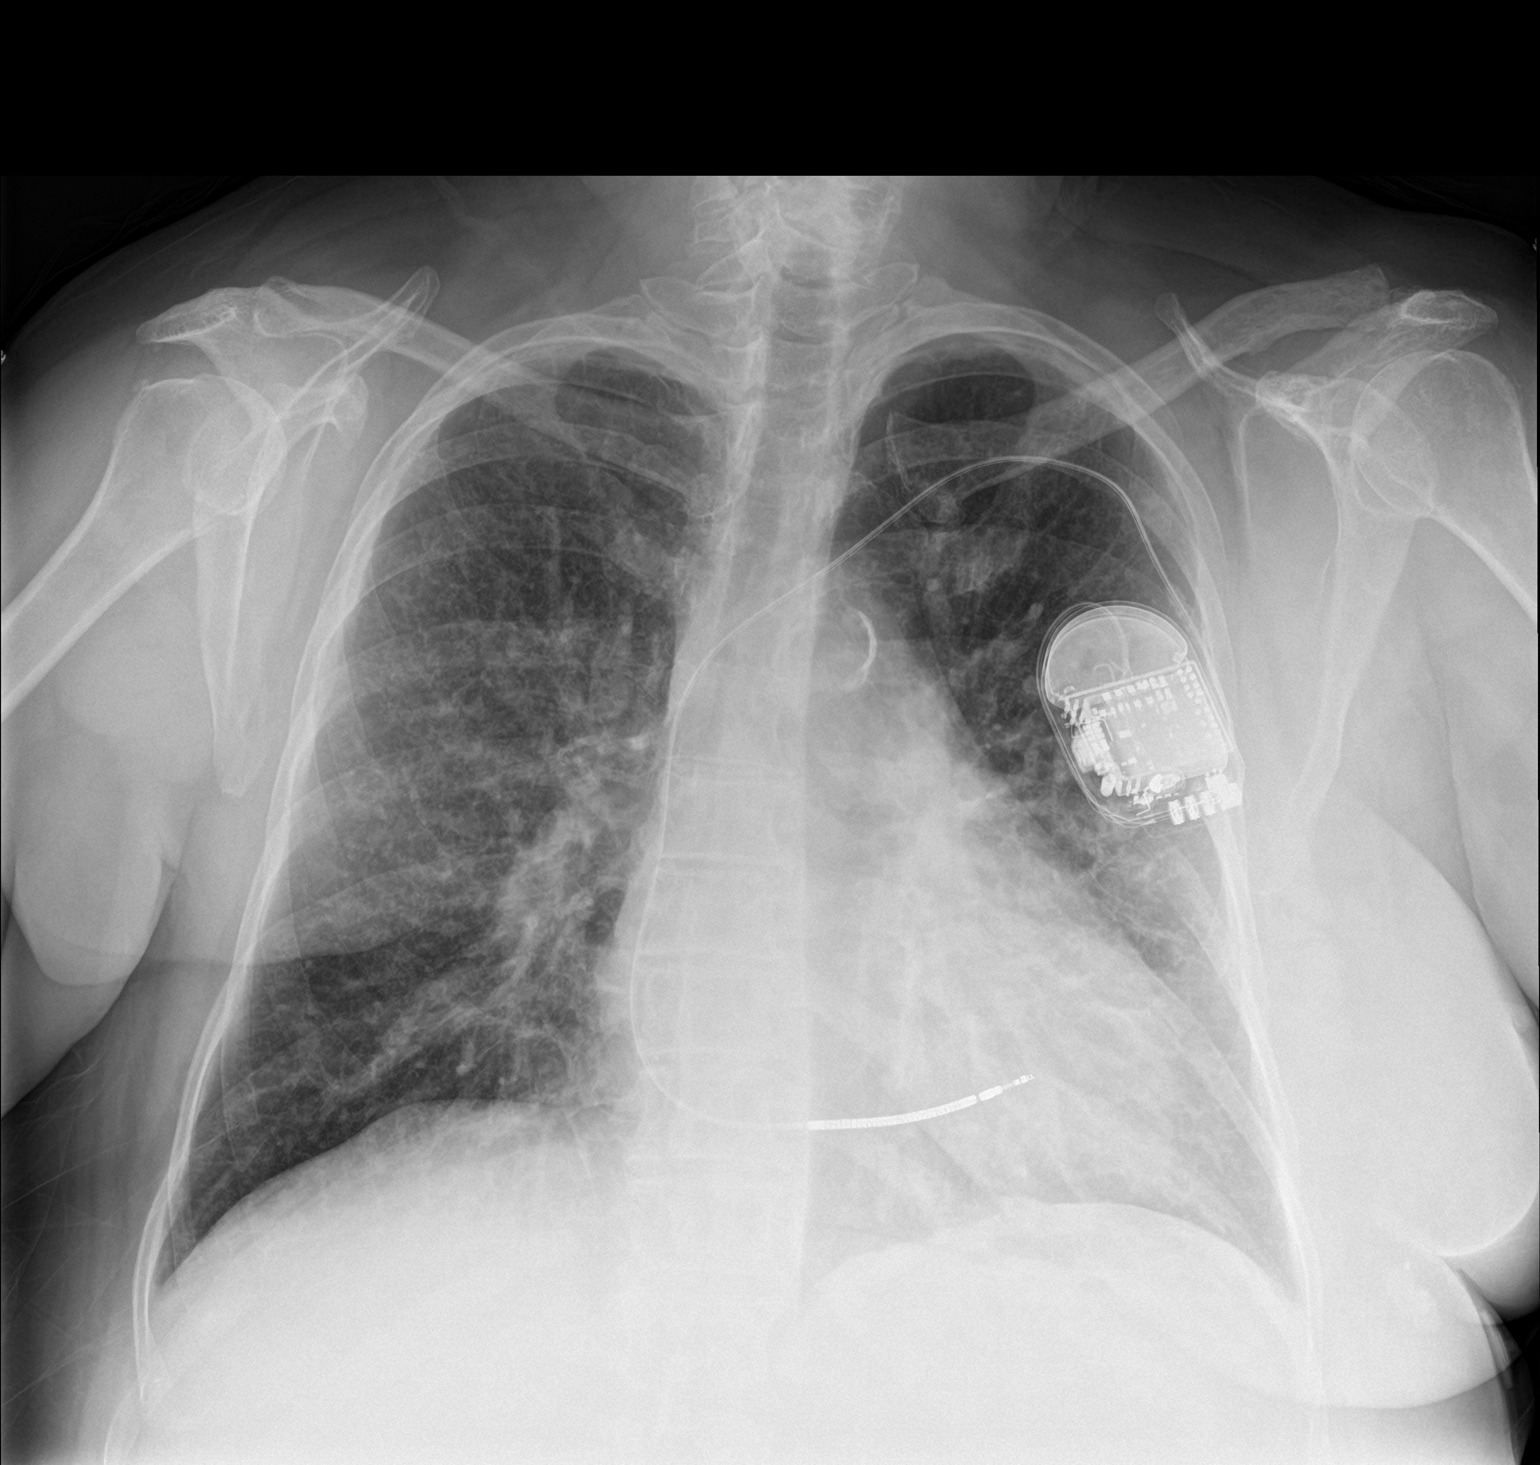

[chest lat]
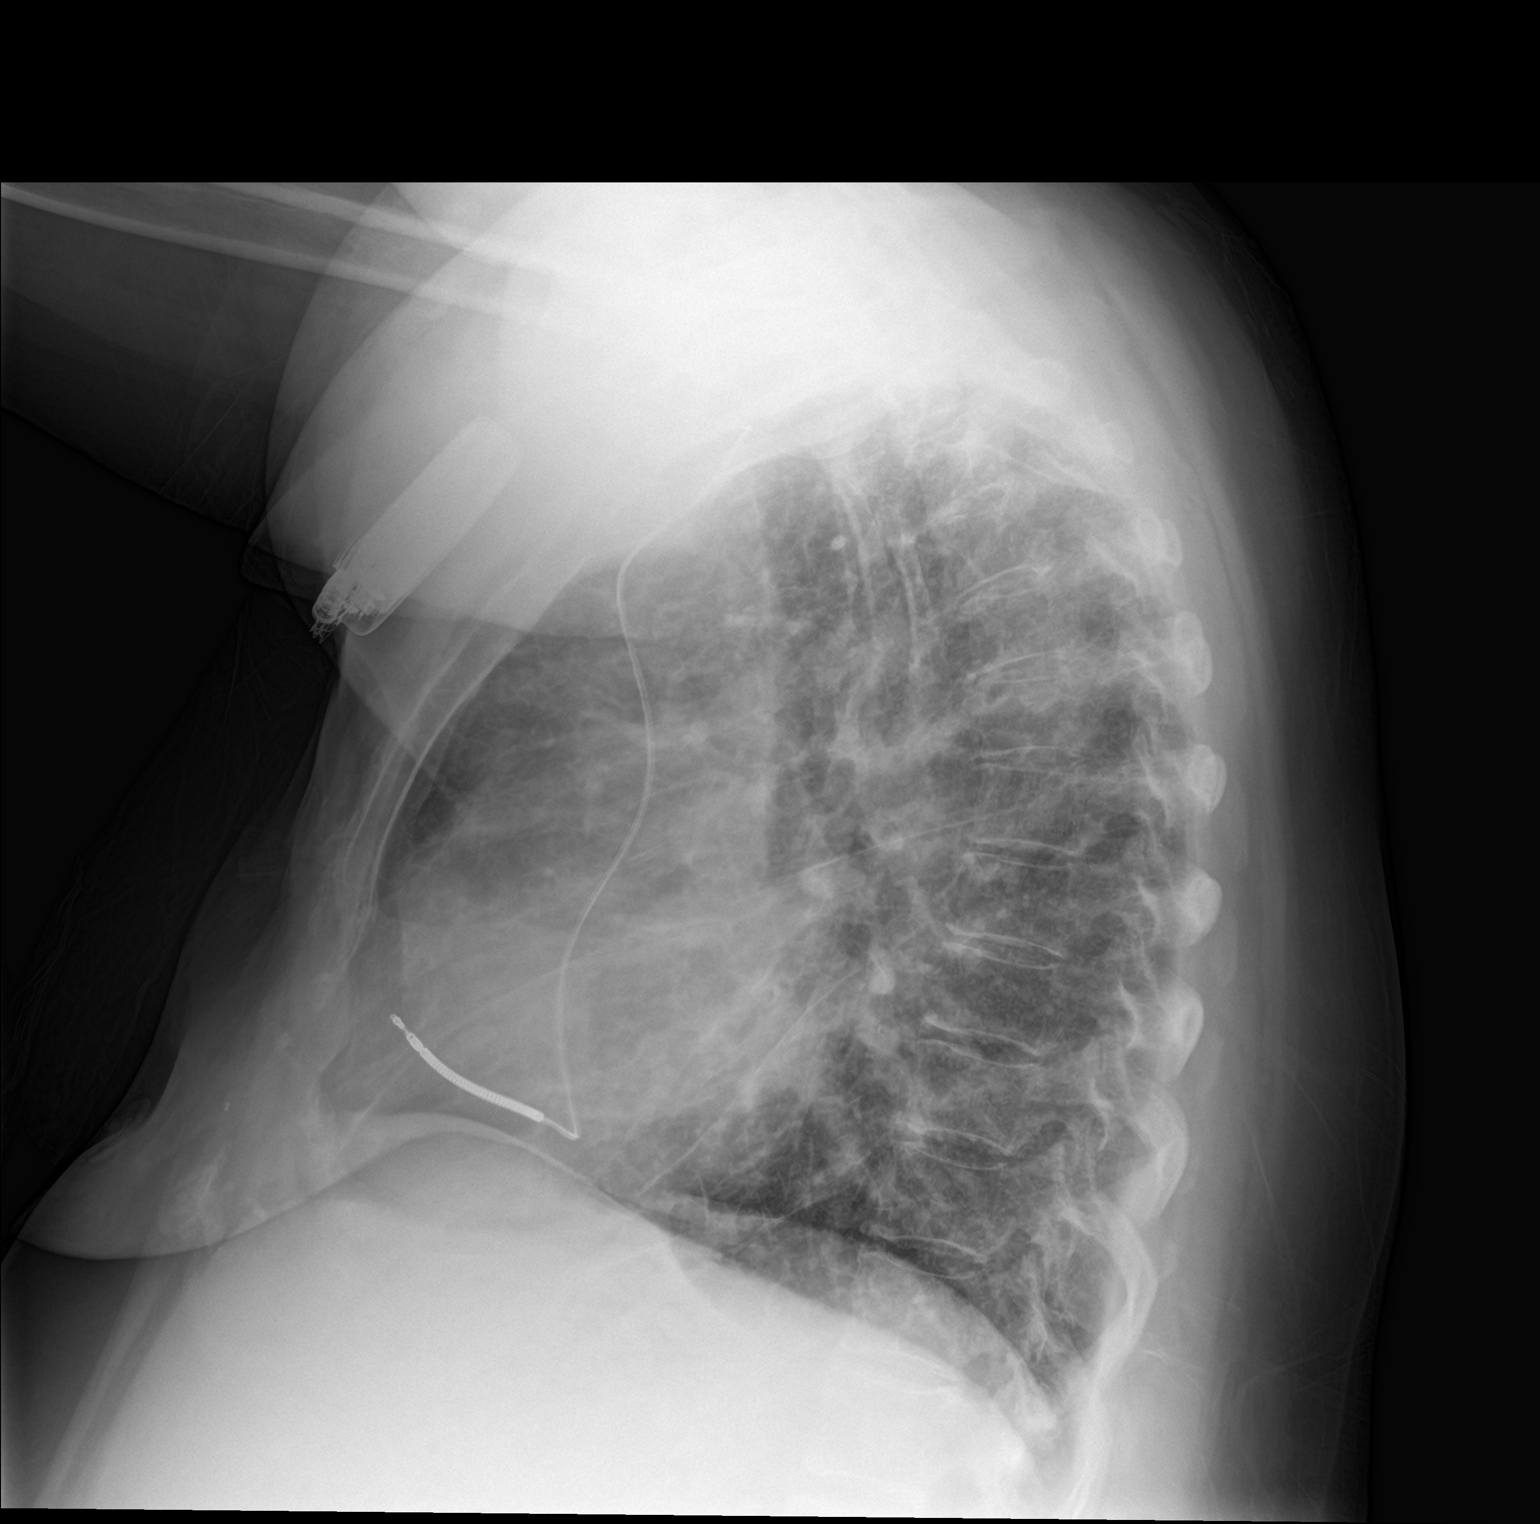

[2 of 2 positions shown; findings below may reference images not displayed]

FINDINGS: LEFT subclavian transvenous AICD lead projects at RIGHT ventricle.

Enlargement of cardiac silhouette with pulmonary vascular
congestion.

Atherosclerotic calcification aorta.

Diffuse interstitial infiltrates likely representing mild pulmonary
edema.

Underlying emphysematous changes.

No segmental consolidation, pleural effusion or pneumothorax.

Bones demineralized.
IMPRESSION: Mild CHF superimposed upon a background of COPD changes.

Aortic atherosclerosis.

## 2017-02-27 NOTE — Telephone Encounter (Signed)
Patient calls CHF clinic to follow up today from yesterday's triage call. Reports she has lost 6 lbs overnight, has decreased swelling in legs, is breathing better, and feels "much better" overall. Will continue to monitor, no changes at this time. Patient aware and agreeable to call CHF clinic again for recurrent issues.  Renee Pain, RN

## 2017-03-03 DIAGNOSIS — Z7901 Long term (current) use of anticoagulants: Secondary | ICD-10-CM | POA: Diagnosis not present

## 2017-03-03 DIAGNOSIS — Z451 Encounter for adjustment and management of infusion pump: Secondary | ICD-10-CM | POA: Diagnosis not present

## 2017-03-04 ENCOUNTER — Other Ambulatory Visit: Payer: Self-pay | Admitting: *Deleted

## 2017-03-04 ENCOUNTER — Encounter: Payer: Self-pay | Admitting: *Deleted

## 2017-03-04 DIAGNOSIS — I5023 Acute on chronic systolic (congestive) heart failure: Secondary | ICD-10-CM | POA: Diagnosis not present

## 2017-03-04 DIAGNOSIS — Z451 Encounter for adjustment and management of infusion pump: Secondary | ICD-10-CM | POA: Diagnosis not present

## 2017-03-04 DIAGNOSIS — Z7901 Long term (current) use of anticoagulants: Secondary | ICD-10-CM | POA: Diagnosis not present

## 2017-03-04 DIAGNOSIS — I110 Hypertensive heart disease with heart failure: Secondary | ICD-10-CM | POA: Diagnosis not present

## 2017-03-04 NOTE — Patient Outreach (Signed)
[facility]) Care Management   03/04/2017  [name] Dec 13, 1955 [id number]  [name] is an 61 y.o. female  Subjective:  Patient complaint of feeling nauseated, gagging episode no vomiting  Patient discussed feeling better today than previous days, still tires very easy after little activity. Discussed she is wearing CPAP at night.  Patient discussed her upcoming medical appointment on this week with PET scan, oncology visit and heart failure clinic.  Patient states she plan to go and stay with her sister later today and she will go with her to MD  visits and the weekend they plan to go to [facility].   Patient pointed out wound under left arm, states she has had it for a while, it looks better but still has some drainage at time. Patient discussed she completed doxycycline prescribed and has follow up appointment with surgeon but not until 8/28 she is on the list to be notified if earlier appointment available.    Objective: BP 108/70 (BP Location: Left Arm, Patient Position: Sitting, Cuff Size: Large)   Pulse (!) 111   Resp 20   Wt 202 lb 6.4 oz (91.8 kg)   SpO2 98%   BMI 33.68 kg/m   Review of Systems  Constitutional: Negative.   HENT: Negative.   Eyes: Negative.   Respiratory: Positive for shortness of breath.   Cardiovascular: Positive for leg swelling.       2+ bilateral lower extremity edema   Gastrointestinal: Positive for nausea.       No vomiting, gagging episode  Genitourinary: Negative.   Skin: Negative.   Neurological: Positive for dizziness.       Dizziness when first getting up  Endo/Heme/Allergies: Negative.   Psychiatric/Behavioral: Negative.     Physical Exam  Constitutional: She is oriented to person, place, and time. She appears well-developed and well-nourished.  Cardiovascular: Normal rate.   Respiratory: Effort normal and breath sounds normal.  GI: Soft.  Musculoskeletal: Normal range of motion.  Neurological:  She is alert and oriented to person, place, and time.  Skin: Skin is warm and dry.     Psychiatric: She has a normal mood and affect. Her behavior is normal. Judgment and thought content normal.    Encounter Medications:   Outpatient Encounter Prescriptions as of 03/04/2017  Medication Sig Note  . acetaminophen (TYLENOL) 650 MG CR tablet Take 1,300 mg by mouth every 8 (eight) hours as needed for pain.   Marland Kitchen albuterol (PROVENTIL HFA) 108 (90 BASE) MCG/ACT inhaler Inhale 2 puffs into the lungs every 6 (six) hours as needed. For shortness of breath   . ALPRAZolam (XANAX) 0.5 MG tablet Take 0.25 mg by mouth 4 (four) times daily as needed for anxiety.    Marland Kitchen amiodarone (PACERONE) 200 MG tablet Take 1 tablet (200 mg total) by mouth 2 (two) times daily.   Marland Kitchen apixaban (ELIQUIS) 5 MG TABS tablet Take 1 tablet (5 mg total) by mouth 2 (two) times daily.   . baclofen (LIORESAL) 20 MG tablet Take 20 mg by mouth 2 (two) times daily as needed for muscle spasms.   . busPIRone (BUSPAR) 30 MG tablet Take 30 mg by mouth 2 (two) times daily.    . Calcium Carb-Cholecalciferol (CALCIUM 600+D3) 600-800 MG-UNIT TABS Take by mouth daily.   . Cholecalciferol (VITAMIN D) 2000 units CAPS Take 1 capsule by mouth daily.   . digoxin (LANOXIN) 0.125 MG tablet Take 1 tablet (0.125 mg total) by mouth every other day.   Marland Kitchen  docusate sodium (COLACE) 100 MG capsule Take 3 capsules (300 mg total) by mouth at bedtime.   . DULoxetine (CYMBALTA) 30 MG capsule Take 30 mg by mouth daily.   . Garlic 127 MG CAPS Take 500 mg by mouth daily after lunch.    . Glucos-Chondroit-Hyaluron-MSM (GLUCOSAMINE CHONDROITIN JOINT PO) Take 1 tablet by mouth daily.   Marland Kitchen HUMALOG KWIKPEN 100 UNIT/ML KiwkPen Inject 12-36 Units into the skin 3 (three) times daily. On a average per patient reports taking 10 units at breakfast, 10 units lunch and 5 to 10 units at supper   . HUMALOG MIX 75/25 KWIKPEN (75-25) 100 UNIT/ML Kwikpen Inject 20 Units into the skin 2 (two)  times daily.    . hydrOXYzine (ATARAX/VISTARIL) 50 MG tablet Take 50 mg by mouth every 6 (six) hours as needed for itching ((primarily taken at bedtime)).    . IRON PO Take 24 mg by mouth daily.   Marland Kitchen levothyroxine (SYNTHROID, LEVOTHROID) 75 MCG tablet Take 75 mcg by mouth daily before breakfast.    . Melatonin 1 MG TABS Take 0.5 mg by mouth at bedtime.   . metolazone (ZAROXOLYN) 2.5 MG tablet Take 1 tablet (2.5 mg total) by mouth once a week. Every Friday and as needed.   . milrinone (PRIMACOR) 20 MG/100 ML SOLN infusion Inject 35.2875 mcg/min into the vein continuous.   . mometasone (NASONEX) 50 MCG/ACT nasal spray Place 1 spray into both nostrils daily. allergies   . Multiple Minerals (CALCIUM-MAGNESIUM-ZINC) TABS Take 1 tablet by mouth daily after lunch.    . nitroGLYCERIN (NITROSTAT) 0.4 MG SL tablet Place 1 tablet (0.4 mg total) under the tongue every 5 (five) minutes as needed. For chest pain 02/13/2017: Has on hand  . Omega-3 Fatty Acids (FISH OIL) 1000 MG CAPS Take 2 capsules by mouth daily.   . pantoprazole (PROTONIX) 40 MG tablet Take 40 mg by mouth 2 (two) times daily.     . polyethylene glycol powder (GLYCOLAX/MIRALAX) powder Mix 17 grams in 8 oz of water twice daily (Patient taking differently: daily. Mix 17 grams in 8 oz of water twice daily)   . potassium chloride SA (K-DUR,KLOR-CON) 20 MEQ tablet Take 3 tablets (60 mEq total) by mouth 2 (two) times daily.   Marland Kitchen Propylene Glycol (SYSTANE BALANCE) 0.6 % SOLN Place 1 drop into both eyes 3 (three) times daily.   Marland Kitchen pyridOXINE (VITAMIN B-6) 100 MG tablet Take 100 mg by mouth daily after lunch.    Marland Kitchen rOPINIRole (REQUIP) 1 MG tablet Take 1 mg by mouth at bedtime and may repeat dose one time if needed. IF RESTLESS LEGS PERSIST PATIENT MAY REPEAT ADDITIONAL DOSE   . rosuvastatin (CRESTOR) 10 MG tablet Take 10 mg by mouth at bedtime.   Marland Kitchen spironolactone (ALDACTONE) 25 MG tablet Take 1 tablet (25 mg total) by mouth 2 (two) times daily.   .  SUMAtriptan (IMITREX) 100 MG tablet Take 100 mg by mouth daily as needed for migraine.    . tiotropium (SPIRIVA) 18 MCG inhalation capsule Place 18 mcg into inhaler and inhale daily.     Marland Kitchen torsemide (DEMADEX) 20 MG tablet Take 3 tablets (60 mg total) by mouth 2 (two) times daily.   . traMADol (ULTRAM) 50 MG tablet Take 1 tablet by mouth daily as needed for severe pain.    . vitamin B-12 (CYANOCOBALAMIN) 1000 MCG tablet Take 1,000 mcg by mouth daily after lunch.    . vitamin C (ASCORBIC ACID) 500 MG tablet Take 500 mg  by mouth daily after lunch.    . vitamin E 400 UNIT capsule Take 400 Units by mouth daily after lunch.    . doxycycline (VIBRA-TABS) 100 MG tablet Take 1 tablet (100 mg total) by mouth every 12 (twelve) hours. (Patient not taking: Reported on 03/04/2017)   . fluconazole (DIFLUCAN) 200 MG tablet Take 1 tablet (200 mg total) by mouth daily. (Patient not taking: Reported on 03/04/2017)   . Simethicone Extra Strength 125 MG CAPS Take 1 tablet by mouth daily as needed (for bloating). Reported on 02/23/2016    No facility-administered encounter medications on file as of 03/04/2017.   Patient was recently discharged from hospital and all medications have been reviewed.  Functional Status:   In your present state of health, do you have any difficulty performing the following activities: 02/13/2017 02/12/2017  Hearing? N -  Vision? N -  Difficulty concentrating or making decisions? N -  Walking or climbing stairs? Y -  Dressing or bathing? N -  Doing errands, shopping? Tempie Donning  Comment daughter helps with transportation  -  Conservation officer, nature and eating ? N -  Using the Toilet? N -  In the past six months, have you accidently leaked urine? N -  Do you have problems with loss of bowel control? N -  Managing your Medications? N -  Managing your Finances? N -  Housekeeping or managing your Housekeeping? Y -  Comment rest when she gets tired, daughter assist  -  Some recent data might be hidden     Fall/Depression Screening:    Fall Risk  02/10/2017 01/14/2017 01/09/2017  Falls in the past year? Yes No No  Number falls in past yr: 1 - -  Injury with Fall? No - -  Risk Factor Category  - - -  Risk for fall due to : Impaired balance/gait Impaired balance/gait;Impaired mobility -  Follow up Falls prevention discussed - -   PHQ 2/9 Scores 02/10/2017 01/14/2017 01/09/2017 12/12/2016 08/08/2016 03/14/2016 01/24/2016  PHQ - 2 Score 0 0 0 0 0 2 0  PHQ- 9 Score - - - - - 7 -    Assessment:   Routine home visit, [facility] RN visit this am for milrinone exchange.  Patient lives at home alone, in a camper next to her brother and her daughter lives just down the  road, few minutes away.   Daughter able to assist with patients' brother that lives next door and requires transportation and  assistance at times, Patient keeps up with all of her brothers' medical appointments dates.     Heart Failure  Patient continues to weigh daily and keep a written record . She keeps a record of fluid intake keeping within   1.5 - 2 liter limit as well as limiting salt to less than 2 grams.   Weight gain in last day less than 2 pounds.Patient still has swelling in lower legs.  Weights  7/30 - 200.8 7/31 - 201.4 Reviewed MD notification of worsening of symptoms. Patient with relief after taking prn zofran for nausea.    Left axilla area wound Has follow up surgical appointment end of next month Patient reports site looks better than it has, still has scant amount of light yellowish discharge at site. Patient keeps tissue to area to help with moisture.. Will call to see if earlier appointment available. Patient declines assistance with arranging  follow up at PCP office for this wound, prefers to discuss at  clinic visit  on 8/3.  Annual screenings  Patient to schedule yearly eye exam, has visit with PCP scheduled for August with A1c follow up.   Diabetes Patient checking blood sugar daily and she has made  adjustment in her sliding scale insulin, due to not eating as much , 30 day average 148. Benefit from  PCP follow , had post hospital visit with heart failure clinic.   Plan:  Will continue weekly outreaches as part of transition of care . Patient will notify MD of worsening of symptoms, of fluid weight, of 2 pounds in a day or 5 in a week, shortness of breath, swelling, chest discomfort. Patient will take all medical equipment with her when she visit her sister to continue daily monitoring .  Placed call to [place] surgeon office to ask about earlier appointment , per representative no earlier visits available, patient still on list to be called if earlier appointment becomes available . Patient will consider earlier visit with PCP before appointment 8/28.   [facility] CM Care Plan Problem One     Most Recent Value  Care Plan Problem One  Patient with recent hospitalization related to Heart failure   Role Documenting the Problem One  Care Management [place] for Problem One  Active  [facility] Long Term Goal   Patient will not experience a hospital admission in the next 60 days   [place] Goal Start Date  02/13/17  Interventions for Problem One Long Term Goal  Reinforced taking medications as prescribed, Encouraged to take all needed medical equipment to continue monitoring while visitng her sister   [facility] CM Short Term Goal #1   Patient will report continuing to weigh daily and keep a record in the next 30 day s  [facility] CM Short Term Goal #1 Start Date  02/13/17 Barrie Folk date restart ]  Interventions for Short Term Goal #1  Reviewed daily weight record and teachback when to call MD   [facility] CM Short Term Goal #2   Patient will attend all medical appointment in the next 30 days   [facility] CM Short Term Goal #2 Start Date  02/13/17 Barrie Folk date restarted ]  Interventions for Short Term Goal #2  reinforced attending all follow up appointments   [facility] CM Short Term Goal #3  Patient will continue to be able to  state worsening symptoms of heart failure and action plan to follow in the next 30 days   [facility] CM Short Term Goal #3 Start Date  02/13/17  Interventions for Short Tern Goal #3  Provided and reviewed EMMI on when to call MD about heart failure,       Joylene Draft, RN, [place] Management Coordinator  [phone]- Mobile [phone]- [place]

## 2017-03-05 ENCOUNTER — Telehealth: Payer: Self-pay | Admitting: Internal Medicine

## 2017-03-05 ENCOUNTER — Ambulatory Visit (HOSPITAL_COMMUNITY)
Admission: RE | Admit: 2017-03-05 | Discharge: 2017-03-05 | Disposition: A | Discharge status: Home / Self Care | Payer: Medicare Other | Source: Ambulatory Visit | Attending: Hematology and Oncology | Admitting: Hematology and Oncology

## 2017-03-05 ENCOUNTER — Telehealth (HOSPITAL_COMMUNITY): Payer: Self-pay | Admitting: *Deleted

## 2017-03-05 ENCOUNTER — Encounter: Payer: Self-pay | Admitting: Internal Medicine

## 2017-03-05 DIAGNOSIS — E875 Hyperkalemia: Secondary | ICD-10-CM | POA: Diagnosis not present

## 2017-03-05 DIAGNOSIS — Z91138 Patient's unintentional underdosing of medication regimen for other reason: Secondary | ICD-10-CM | POA: Diagnosis not present

## 2017-03-05 DIAGNOSIS — E1122 Type 2 diabetes mellitus with diabetic chronic kidney disease: Secondary | ICD-10-CM | POA: Diagnosis not present

## 2017-03-05 DIAGNOSIS — D3502 Benign neoplasm of left adrenal gland: Secondary | ICD-10-CM | POA: Insufficient documentation

## 2017-03-05 DIAGNOSIS — R911 Solitary pulmonary nodule: Secondary | ICD-10-CM

## 2017-03-05 DIAGNOSIS — N189 Chronic kidney disease, unspecified: Secondary | ICD-10-CM | POA: Diagnosis not present

## 2017-03-05 DIAGNOSIS — J9601 Acute respiratory failure with hypoxia: Secondary | ICD-10-CM | POA: Diagnosis not present

## 2017-03-05 DIAGNOSIS — R918 Other nonspecific abnormal finding of lung field: Secondary | ICD-10-CM | POA: Diagnosis not present

## 2017-03-05 DIAGNOSIS — L02214 Cutaneous abscess of groin: Secondary | ICD-10-CM | POA: Diagnosis not present

## 2017-03-05 DIAGNOSIS — I509 Heart failure, unspecified: Secondary | ICD-10-CM | POA: Diagnosis not present

## 2017-03-05 DIAGNOSIS — R591 Generalized enlarged lymph nodes: Secondary | ICD-10-CM | POA: Diagnosis not present

## 2017-03-05 DIAGNOSIS — R0602 Shortness of breath: Secondary | ICD-10-CM | POA: Diagnosis not present

## 2017-03-05 DIAGNOSIS — J90 Pleural effusion, not elsewhere classified: Secondary | ICD-10-CM

## 2017-03-05 DIAGNOSIS — Z171 Estrogen receptor negative status [ER-]: Principal | ICD-10-CM

## 2017-03-05 DIAGNOSIS — T451X5S Adverse effect of antineoplastic and immunosuppressive drugs, sequela: Secondary | ICD-10-CM | POA: Diagnosis not present

## 2017-03-05 DIAGNOSIS — R590 Localized enlarged lymph nodes: Secondary | ICD-10-CM | POA: Insufficient documentation

## 2017-03-05 DIAGNOSIS — Z66 Do not resuscitate: Secondary | ICD-10-CM | POA: Diagnosis not present

## 2017-03-05 DIAGNOSIS — G4733 Obstructive sleep apnea (adult) (pediatric): Secondary | ICD-10-CM | POA: Diagnosis not present

## 2017-03-05 DIAGNOSIS — E032 Hypothyroidism due to medicaments and other exogenous substances: Secondary | ICD-10-CM | POA: Diagnosis not present

## 2017-03-05 DIAGNOSIS — C50511 Malignant neoplasm of lower-outer quadrant of right female breast: Secondary | ICD-10-CM

## 2017-03-05 DIAGNOSIS — J439 Emphysema, unspecified: Secondary | ICD-10-CM | POA: Insufficient documentation

## 2017-03-05 DIAGNOSIS — I110 Hypertensive heart disease with heart failure: Secondary | ICD-10-CM | POA: Diagnosis not present

## 2017-03-05 DIAGNOSIS — N179 Acute kidney failure, unspecified: Secondary | ICD-10-CM | POA: Diagnosis not present

## 2017-03-05 DIAGNOSIS — E785 Hyperlipidemia, unspecified: Secondary | ICD-10-CM | POA: Diagnosis not present

## 2017-03-05 DIAGNOSIS — T501X6A Underdosing of loop [high-ceiling] diuretics, initial encounter: Secondary | ICD-10-CM | POA: Diagnosis not present

## 2017-03-05 DIAGNOSIS — I427 Cardiomyopathy due to drug and external agent: Secondary | ICD-10-CM | POA: Diagnosis not present

## 2017-03-05 DIAGNOSIS — C50919 Malignant neoplasm of unspecified site of unspecified female breast: Secondary | ICD-10-CM | POA: Diagnosis not present

## 2017-03-05 DIAGNOSIS — E876 Hypokalemia: Secondary | ICD-10-CM | POA: Diagnosis not present

## 2017-03-05 DIAGNOSIS — Z515 Encounter for palliative care: Secondary | ICD-10-CM | POA: Diagnosis not present

## 2017-03-05 DIAGNOSIS — I5023 Acute on chronic systolic (congestive) heart failure: Secondary | ICD-10-CM | POA: Diagnosis not present

## 2017-03-05 DIAGNOSIS — I700 Atherosclerosis of aorta: Secondary | ICD-10-CM | POA: Insufficient documentation

## 2017-03-05 DIAGNOSIS — I5084 End stage heart failure: Secondary | ICD-10-CM | POA: Diagnosis not present

## 2017-03-05 DIAGNOSIS — I482 Chronic atrial fibrillation: Secondary | ICD-10-CM | POA: Diagnosis not present

## 2017-03-05 DIAGNOSIS — I130 Hypertensive heart and chronic kidney disease with heart failure and stage 1 through stage 4 chronic kidney disease, or unspecified chronic kidney disease: Secondary | ICD-10-CM | POA: Diagnosis not present

## 2017-03-05 DIAGNOSIS — E871 Hypo-osmolality and hyponatremia: Secondary | ICD-10-CM | POA: Diagnosis not present

## 2017-03-05 DIAGNOSIS — I481 Persistent atrial fibrillation: Secondary | ICD-10-CM | POA: Diagnosis not present

## 2017-03-05 LAB — GLUCOSE, CAPILLARY: Glucose-Capillary: 208 mg/dL — ABNORMAL HIGH (ref 65–99)

## 2017-03-05 MED ORDER — FLUDEOXYGLUCOSE F - 18 (FDG) INJECTION
11.3000 | Freq: Once | INTRAVENOUS | Status: AC | PRN
  Administered 2017-03-05: 11.3 via INTRAVENOUS

## 2017-03-05 NOTE — Telephone Encounter (Signed)
Patient called and reported that she had gained 4 lbs in the last 48 hours (2 lbs each night) and is starting to have increased shortness of breath today.  I spoke with Jettie Booze, NP and she advises patient to take an extra metolazone today and call us back if she continues to have symptoms.  Patient understands and no further questions.

## 2017-03-05 NOTE — Telephone Encounter (Signed)
Paged by answering service re: question about substitution for torsemide. Returned call to patient. She had called earlier in the day noting a 4 lb weight gain, and she was told to take metolazone, which she did. What she did not realize until this evening is that she has been out of her torsemide for two days. She was asking as to whether she should take another dose of metolazone.  She notes feeling somewhat short of breath, but she is able to speak in sentences. She has already spoken to her pharmacy, and her torsemide will be ready at 8:30 AM tomorrow. I spoke with both the patient, and then she asked me to speak to her sister, which I did. Patient and her sister agreed to wear CPAP at home. They have oxygen available and will add it to the CPAP if it makes the patient more comfortable. She will take her torsemide as prescribed and follow up with the office if she remains above her baseline weight.  I reviewed red flag signs and reasons to come to the ER, and they understand.  Buford Dresser, MD, PhD, overnight cardiology provider

## 2017-03-06 ENCOUNTER — Other Ambulatory Visit: Payer: Self-pay

## 2017-03-06 ENCOUNTER — Encounter (HOSPITAL_COMMUNITY): Payer: Self-pay | Admitting: Emergency Medicine

## 2017-03-06 ENCOUNTER — Other Ambulatory Visit (HOSPITAL_COMMUNITY): Payer: Self-pay | Admitting: Internal Medicine

## 2017-03-06 ENCOUNTER — Ambulatory Visit: Payer: Medicare Other | Admitting: Hematology and Oncology

## 2017-03-06 ENCOUNTER — Emergency Department (HOSPITAL_COMMUNITY): Payer: Medicare Other

## 2017-03-06 ENCOUNTER — Telehealth (HOSPITAL_COMMUNITY): Payer: Self-pay | Admitting: *Deleted

## 2017-03-06 ENCOUNTER — Inpatient Hospital Stay (HOSPITAL_COMMUNITY)
Admission: EM | Admit: 2017-03-06 | Discharge: 2017-03-13 | DRG: 291 | Disposition: A | Discharge status: Hospice | Payer: Medicare Other | Attending: Internal Medicine | Admitting: Internal Medicine

## 2017-03-06 DIAGNOSIS — E669 Obesity, unspecified: Secondary | ICD-10-CM | POA: Diagnosis present

## 2017-03-06 DIAGNOSIS — I130 Hypertensive heart and chronic kidney disease with heart failure and stage 1 through stage 4 chronic kidney disease, or unspecified chronic kidney disease: Secondary | ICD-10-CM | POA: Diagnosis not present

## 2017-03-06 DIAGNOSIS — I5023 Acute on chronic systolic (congestive) heart failure: Secondary | ICD-10-CM | POA: Diagnosis not present

## 2017-03-06 DIAGNOSIS — T501X6A Underdosing of loop [high-ceiling] diuretics, initial encounter: Secondary | ICD-10-CM | POA: Diagnosis present

## 2017-03-06 DIAGNOSIS — Z87891 Personal history of nicotine dependence: Secondary | ICD-10-CM

## 2017-03-06 DIAGNOSIS — G4733 Obstructive sleep apnea (adult) (pediatric): Secondary | ICD-10-CM | POA: Diagnosis present

## 2017-03-06 DIAGNOSIS — Z7951 Long term (current) use of inhaled steroids: Secondary | ICD-10-CM

## 2017-03-06 DIAGNOSIS — E871 Hypo-osmolality and hyponatremia: Secondary | ICD-10-CM | POA: Diagnosis present

## 2017-03-06 DIAGNOSIS — E876 Hypokalemia: Secondary | ICD-10-CM | POA: Diagnosis not present

## 2017-03-06 DIAGNOSIS — R0602 Shortness of breath: Secondary | ICD-10-CM | POA: Diagnosis not present

## 2017-03-06 DIAGNOSIS — I482 Chronic atrial fibrillation: Secondary | ICD-10-CM | POA: Diagnosis present

## 2017-03-06 DIAGNOSIS — Z886 Allergy status to analgesic agent status: Secondary | ICD-10-CM

## 2017-03-06 DIAGNOSIS — I509 Heart failure, unspecified: Secondary | ICD-10-CM | POA: Diagnosis not present

## 2017-03-06 DIAGNOSIS — Z91138 Patient's unintentional underdosing of medication regimen for other reason: Secondary | ICD-10-CM | POA: Diagnosis not present

## 2017-03-06 DIAGNOSIS — E875 Hyperkalemia: Secondary | ICD-10-CM

## 2017-03-06 DIAGNOSIS — N179 Acute kidney failure, unspecified: Secondary | ICD-10-CM | POA: Diagnosis present

## 2017-03-06 DIAGNOSIS — F329 Major depressive disorder, single episode, unspecified: Secondary | ICD-10-CM | POA: Diagnosis present

## 2017-03-06 DIAGNOSIS — I5084 End stage heart failure: Secondary | ICD-10-CM | POA: Diagnosis present

## 2017-03-06 DIAGNOSIS — I427 Cardiomyopathy due to drug and external agent: Secondary | ICD-10-CM | POA: Diagnosis present

## 2017-03-06 DIAGNOSIS — Z7901 Long term (current) use of anticoagulants: Secondary | ICD-10-CM | POA: Diagnosis not present

## 2017-03-06 DIAGNOSIS — J9601 Acute respiratory failure with hypoxia: Secondary | ICD-10-CM

## 2017-03-06 DIAGNOSIS — Z885 Allergy status to narcotic agent status: Secondary | ICD-10-CM

## 2017-03-06 DIAGNOSIS — R591 Generalized enlarged lymph nodes: Secondary | ICD-10-CM | POA: Diagnosis present

## 2017-03-06 DIAGNOSIS — I481 Persistent atrial fibrillation: Secondary | ICD-10-CM | POA: Diagnosis present

## 2017-03-06 DIAGNOSIS — N189 Chronic kidney disease, unspecified: Secondary | ICD-10-CM | POA: Diagnosis present

## 2017-03-06 DIAGNOSIS — Z66 Do not resuscitate: Secondary | ICD-10-CM | POA: Diagnosis not present

## 2017-03-06 DIAGNOSIS — Z8049 Family history of malignant neoplasm of other genital organs: Secondary | ICD-10-CM

## 2017-03-06 DIAGNOSIS — E1122 Type 2 diabetes mellitus with diabetic chronic kidney disease: Secondary | ICD-10-CM | POA: Diagnosis present

## 2017-03-06 DIAGNOSIS — J449 Chronic obstructive pulmonary disease, unspecified: Secondary | ICD-10-CM | POA: Diagnosis present

## 2017-03-06 DIAGNOSIS — Z794 Long term (current) use of insulin: Secondary | ICD-10-CM

## 2017-03-06 DIAGNOSIS — G2581 Restless legs syndrome: Secondary | ICD-10-CM | POA: Diagnosis present

## 2017-03-06 DIAGNOSIS — Z800 Family history of malignant neoplasm of digestive organs: Secondary | ICD-10-CM

## 2017-03-06 DIAGNOSIS — Z91030 Bee allergy status: Secondary | ICD-10-CM

## 2017-03-06 DIAGNOSIS — L02214 Cutaneous abscess of groin: Secondary | ICD-10-CM | POA: Diagnosis present

## 2017-03-06 DIAGNOSIS — Z451 Encounter for adjustment and management of infusion pump: Secondary | ICD-10-CM | POA: Diagnosis not present

## 2017-03-06 DIAGNOSIS — E1143 Type 2 diabetes mellitus with diabetic autonomic (poly)neuropathy: Secondary | ICD-10-CM | POA: Diagnosis present

## 2017-03-06 DIAGNOSIS — Z923 Personal history of irradiation: Secondary | ICD-10-CM

## 2017-03-06 DIAGNOSIS — Z803 Family history of malignant neoplasm of breast: Secondary | ICD-10-CM

## 2017-03-06 DIAGNOSIS — Z801 Family history of malignant neoplasm of trachea, bronchus and lung: Secondary | ICD-10-CM

## 2017-03-06 DIAGNOSIS — Z8679 Personal history of other diseases of the circulatory system: Secondary | ICD-10-CM

## 2017-03-06 DIAGNOSIS — E032 Hypothyroidism due to medicaments and other exogenous substances: Secondary | ICD-10-CM | POA: Diagnosis present

## 2017-03-06 DIAGNOSIS — T451X5S Adverse effect of antineoplastic and immunosuppressive drugs, sequela: Secondary | ICD-10-CM

## 2017-03-06 DIAGNOSIS — Z515 Encounter for palliative care: Secondary | ICD-10-CM | POA: Diagnosis not present

## 2017-03-06 DIAGNOSIS — Z881 Allergy status to other antibiotic agents status: Secondary | ICD-10-CM

## 2017-03-06 DIAGNOSIS — Z9221 Personal history of antineoplastic chemotherapy: Secondary | ICD-10-CM

## 2017-03-06 DIAGNOSIS — Z95810 Presence of automatic (implantable) cardiac defibrillator: Secondary | ICD-10-CM

## 2017-03-06 DIAGNOSIS — Z91048 Other nonmedicinal substance allergy status: Secondary | ICD-10-CM

## 2017-03-06 DIAGNOSIS — I110 Hypertensive heart disease with heart failure: Secondary | ICD-10-CM | POA: Diagnosis not present

## 2017-03-06 DIAGNOSIS — E785 Hyperlipidemia, unspecified: Secondary | ICD-10-CM | POA: Diagnosis present

## 2017-03-06 DIAGNOSIS — Z8249 Family history of ischemic heart disease and other diseases of the circulatory system: Secondary | ICD-10-CM

## 2017-03-06 DIAGNOSIS — C50919 Malignant neoplasm of unspecified site of unspecified female breast: Secondary | ICD-10-CM | POA: Diagnosis present

## 2017-03-06 DIAGNOSIS — R069 Unspecified abnormalities of breathing: Secondary | ICD-10-CM | POA: Diagnosis not present

## 2017-03-06 DIAGNOSIS — F419 Anxiety disorder, unspecified: Secondary | ICD-10-CM | POA: Diagnosis present

## 2017-03-06 DIAGNOSIS — Z882 Allergy status to sulfonamides status: Secondary | ICD-10-CM

## 2017-03-06 DIAGNOSIS — I252 Old myocardial infarction: Secondary | ICD-10-CM

## 2017-03-06 DIAGNOSIS — R570 Cardiogenic shock: Secondary | ICD-10-CM | POA: Diagnosis not present

## 2017-03-06 DIAGNOSIS — K219 Gastro-esophageal reflux disease without esophagitis: Secondary | ICD-10-CM | POA: Diagnosis present

## 2017-03-06 DIAGNOSIS — Z7189 Other specified counseling: Secondary | ICD-10-CM | POA: Diagnosis not present

## 2017-03-06 DIAGNOSIS — Z888 Allergy status to other drugs, medicaments and biological substances status: Secondary | ICD-10-CM

## 2017-03-06 DIAGNOSIS — Z6835 Body mass index (BMI) 35.0-35.9, adult: Secondary | ICD-10-CM

## 2017-03-06 DIAGNOSIS — K3184 Gastroparesis: Secondary | ICD-10-CM | POA: Diagnosis present

## 2017-03-06 LAB — DIGOXIN LEVEL: Digoxin Level: 0.9 ng/mL (ref 0.8–2.0)

## 2017-03-06 LAB — I-STAT TROPONIN, ED: Troponin i, poc: 0.01 ng/mL (ref 0.00–0.08)

## 2017-03-06 LAB — COOXEMETRY PANEL
CARBOXYHEMOGLOBIN: 1.2 % (ref 0.5–1.5)
METHEMOGLOBIN: 0.9 % (ref 0.0–1.5)
O2 Saturation: 59.2 %
Total hemoglobin: 15.4 g/dL (ref 12.0–16.0)

## 2017-03-06 LAB — I-STAT ARTERIAL BLOOD GAS, ED
ACID-BASE DEFICIT: 6 mmol/L — AB (ref 0.0–2.0)
Bicarbonate: 16.3 mmol/L — ABNORMAL LOW (ref 20.0–28.0)
O2 SAT: 98 %
PCO2 ART: 25.5 mmHg — AB (ref 32.0–48.0)
PH ART: 7.411 (ref 7.350–7.450)
PO2 ART: 99 mmHg (ref 83.0–108.0)
Patient temperature: 97.7
TCO2: 17 mmol/L (ref 0–100)

## 2017-03-06 LAB — BASIC METABOLIC PANEL
Anion gap: 14 (ref 5–15)
Anion gap: 9 (ref 5–15)
Anion gap: 9 (ref 5–15)
BUN: 50 mg/dL — AB (ref 6–20)
BUN: 52 mg/dL — AB (ref 6–20)
BUN: 53 mg/dL — ABNORMAL HIGH (ref 6–20)
CALCIUM: 9.6 mg/dL (ref 8.9–10.3)
CHLORIDE: 95 mmol/L — AB (ref 101–111)
CHLORIDE: 98 mmol/L — AB (ref 101–111)
CHLORIDE: 95 mmol/L — AB (ref 101–111)
CO2: 20 mmol/L — AB (ref 22–32)
CO2: 23 mmol/L (ref 22–32)
CO2: 26 mmol/L (ref 22–32)
CREATININE: 2.42 mg/dL — AB (ref 0.44–1.00)
CREATININE: 2.6 mg/dL — AB (ref 0.44–1.00)
CREATININE: 2.3 mg/dL — AB (ref 0.44–1.00)
Calcium: 9.9 mg/dL (ref 8.9–10.3)
Calcium: 10.1 mg/dL (ref 8.9–10.3)
GFR calc Af Amer: 24 mL/min — ABNORMAL LOW (ref >60)
GFR calc non Af Amer: 20 mL/min — ABNORMAL LOW (ref >60)
GFR, EST AFRICAN AMERICAN: 22 mL/min — AB (ref >60)
GFR, EST AFRICAN AMERICAN: 25 mL/min — AB (ref >60)
GFR, EST NON AFRICAN AMERICAN: 19 mL/min — AB (ref >60)
GFR, EST NON AFRICAN AMERICAN: 22 mL/min — AB (ref >60)
GLUCOSE: 243 mg/dL — AB (ref 65–99)
Glucose, Bld: 185 mg/dL — ABNORMAL HIGH (ref 65–99)
Glucose, Bld: 220 mg/dL — ABNORMAL HIGH (ref 65–99)
Potassium: 7.4 mmol/L (ref 3.5–5.1)
Potassium: 6.8 mmol/L (ref 3.5–5.1)
Potassium: 4.7 mmol/L (ref 3.5–5.1)
SODIUM: 129 mmol/L — AB (ref 135–145)
SODIUM: 130 mmol/L — AB (ref 135–145)
SODIUM: 130 mmol/L — AB (ref 135–145)

## 2017-03-06 LAB — BRAIN NATRIURETIC PEPTIDE: B Natriuretic Peptide: 1084.8 pg/mL — ABNORMAL HIGH (ref 0.0–100.0)

## 2017-03-06 LAB — CBC
HCT: 46.9 % — ABNORMAL HIGH (ref 36.0–46.0)
Hemoglobin: 15.1 g/dL — ABNORMAL HIGH (ref 12.0–15.0)
MCH: 29.3 pg (ref 26.0–34.0)
MCHC: 32.2 g/dL (ref 30.0–36.0)
MCV: 91.1 fL (ref 78.0–100.0)
PLATELETS: 352 10*3/uL (ref 150–400)
RBC: 5.15 MIL/uL — ABNORMAL HIGH (ref 3.87–5.11)
RDW: 17.9 % — AB (ref 11.5–15.5)
WBC: 8.1 10*3/uL (ref 4.0–10.5)

## 2017-03-06 LAB — MRSA PCR SCREENING: MRSA BY PCR: NEGATIVE

## 2017-03-06 MED ORDER — DEXTROSE 50 % IV SOLN
INTRAVENOUS | Status: AC
  Administered 2017-03-06: 50 mL
  Filled 2017-03-06: qty 50

## 2017-03-06 MED ORDER — FUROSEMIDE 10 MG/ML IJ SOLN
40.0000 mg | Freq: Once | INTRAMUSCULAR | Status: AC
  Administered 2017-03-06: 40 mg via INTRAVENOUS
  Filled 2017-03-06: qty 4

## 2017-03-06 MED ORDER — FUROSEMIDE 10 MG/ML IJ SOLN
80.0000 mg | Freq: Two times a day (BID) | INTRAMUSCULAR | Status: DC
  Administered 2017-03-06: 80 mg via INTRAVENOUS
  Filled 2017-03-06: qty 8

## 2017-03-06 MED ORDER — ONDANSETRON HCL 4 MG/2ML IJ SOLN
4.0000 mg | Freq: Four times a day (QID) | INTRAMUSCULAR | Status: DC | PRN
  Administered 2017-03-10 – 2017-03-13 (×5): 4 mg via INTRAVENOUS
  Filled 2017-03-06 (×5): qty 2

## 2017-03-06 MED ORDER — SODIUM CHLORIDE 0.9% FLUSH: 3.0000 mL | INTRAVENOUS | Status: DC | PRN

## 2017-03-06 MED ORDER — ACETAMINOPHEN 325 MG PO TABS
650.0000 mg | ORAL_TABLET | ORAL | Status: DC | PRN
Start: 2017-03-06 — End: 2017-03-13
  Administered 2017-03-09 (×2): 650 mg via ORAL
  Filled 2017-03-06 (×2): qty 2

## 2017-03-06 MED ORDER — SODIUM POLYSTYRENE SULFONATE 15 GM/60ML PO SUSP
30.0000 g | Freq: Once | ORAL | Status: AC
  Administered 2017-03-06: 30 g via ORAL
  Filled 2017-03-06: qty 120

## 2017-03-06 MED ORDER — MILRINONE LACTATE IN DEXTROSE 20-5 MG/100ML-% IV SOLN
0.5000 ug/kg/min | INTRAVENOUS | Status: DC
  Administered 2017-03-06 – 2017-03-12 (×19): 0.5 ug/kg/min via INTRAVENOUS
  Filled 2017-03-06 (×19): qty 100

## 2017-03-06 MED ORDER — SODIUM CHLORIDE 0.9% FLUSH
3.0000 mL | Freq: Two times a day (BID) | INTRAVENOUS | Status: DC
  Administered 2017-03-06: 10 mL via INTRAVENOUS
  Administered 2017-03-06 – 2017-03-13 (×9): 3 mL via INTRAVENOUS

## 2017-03-06 MED ORDER — INSULIN ASPART 100 UNIT/ML ~~LOC~~ SOLN
SUBCUTANEOUS | Status: AC
  Administered 2017-03-06: 10 [IU]
  Filled 2017-03-06: qty 1

## 2017-03-06 MED ORDER — FUROSEMIDE 10 MG/ML IJ SOLN
80.0000 mg | Freq: Two times a day (BID) | INTRAMUSCULAR | Status: DC
  Administered 2017-03-06 – 2017-03-09 (×6): 80 mg via INTRAVENOUS
  Filled 2017-03-06 (×6): qty 8

## 2017-03-06 MED ORDER — CALCIUM GLUCONATE 10 % IV SOLN
INTRAVENOUS | Status: AC
  Administered 2017-03-06: 1000 mg
  Filled 2017-03-06: qty 10

## 2017-03-06 MED ORDER — FENTANYL CITRATE (PF) 100 MCG/2ML IJ SOLN
25.0000 ug | Freq: Once | INTRAMUSCULAR | Status: AC
  Administered 2017-03-06: 25 ug via INTRAVENOUS

## 2017-03-06 MED ORDER — CHLORHEXIDINE GLUCONATE 0.12 % MT SOLN
15.0000 mL | Freq: Two times a day (BID) | OROMUCOSAL | Status: DC
  Administered 2017-03-06 – 2017-03-07 (×2): 15 mL via OROMUCOSAL
  Filled 2017-03-06: qty 15

## 2017-03-06 MED ORDER — SODIUM CHLORIDE 0.9 % IV SOLN: 250.0000 mL | INTRAVENOUS | Status: DC | PRN

## 2017-03-06 MED ORDER — MILRINONE LACTATE IN DEXTROSE 20-5 MG/100ML-% IV SOLN: 0.5000 ug/kg/min | INTRAVENOUS | Status: DC

## 2017-03-06 MED ORDER — FENTANYL CITRATE (PF) 100 MCG/2ML IJ SOLN
INTRAMUSCULAR | Status: AC
  Filled 2017-03-06: qty 2

## 2017-03-06 MED ORDER — ORAL CARE MOUTH RINSE
15.0000 mL | Freq: Two times a day (BID) | OROMUCOSAL | Status: DC
  Administered 2017-03-06: 15 mL via OROMUCOSAL

## 2017-03-06 MED ORDER — CALCIUM CHLORIDE 10 % IV SOLN
INTRAVENOUS | Status: AC
  Filled 2017-03-06: qty 10

## 2017-03-06 NOTE — Assessment & Plan Note (Deleted)
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 Benign left  Adrenal adenoma. Mild mediastinal hypermetabolic lymphadenopathy in the right paratracheal and subcarinal regions could be reactive or malignant.  Mediastinal lymph nodes: Three-month follow-up CT chest was recommended.  Return to clinic in 3 months with CT chest follow-up

## 2017-03-06 NOTE — Progress Notes (Signed)
[place] Progress Note Patient Name: ERINN MENDOSA DOB: 11-05-1955 MRN: [id number]   Date of Service  03/06/2017  HPI/Events of Note  New Admission. Patient presented with altered mentation, acute on chronic systolic congestive heart failure, acute hypoxic respiratory failure, and acute on chronic renal failure. Patient treated for hyperkalemia. Status post Kayexalate. Minimal urine output from IV Lasix. Camera shows patient and family member present. Patient off BiPAP transiently for patient comfort. Nurse at bedside and reapplying BiPAP.   eICU Interventions  Continuing close monitoring & plan of care as per admitting service.      Intervention Category Evaluation Type: New Patient Evaluation  Tera Partridge 03/06/2017, 5:26 PM

## 2017-03-06 NOTE — Progress Notes (Signed)
Patient transported from ED to [facility] room 15 on bi-pap. Vital signs are stable. Sats currently 94%.

## 2017-03-06 NOTE — Telephone Encounter (Signed)
Baker Janus, RN with [facility] called concerned about patient's condition.  She stated that patient had missed 3 days of her torsemide because they were not in her pill box and was unaware that she had not been taking them.  She stated patient is very short of breath and deconditioned since her last visit last week. Also stated it took two people to get her out of bed to stand.  I advised her to have patient brought into the ER.  Jettie Booze, NP is aware.

## 2017-03-06 NOTE — ED Provider Notes (Signed)
[facility] DEPT Provider Note   CSN: [id number] Arrival date & time: 03/06/17  1126     History   Chief Complaint Chief Complaint  Patient presents with  . Shortness of Breath    HPI [name] is a 61 y.o. female.  This patient is a 61 year old female with past medical history of cardiomyopathy with decreased ejection fraction of 10%. She is followed by Dr. Jeffie Pollock in the heart failure clinic. She is on a milrinone drip. She presents today with increased work of breathing. She reports some swelling of her legs. She does report that she may have forgotten to take the last 3 doses of torsemide. She called the cardiology clinic and was told to come here for evaluation.   The history is provided by the patient.  Shortness of Breath  This is a recurrent problem. The problem occurs continuously.The problem has been rapidly worsening. Associated symptoms include leg swelling. Pertinent negatives include no fever, no cough, no sputum production and no chest pain. She has tried nothing for the symptoms. Associated medical issues do not include asthma or COPD.    Past Medical History:  Diagnosis Date  . AICD (automatic cardioverter/defibrillator) present    st jude  . Anxiety   . 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 Asthma   . Axillary mass, left 10/17/2016  . Breast cancer ([place]) 2005, 2017  . CHF (congestive heart failure) ([place])   . COPD (chronic obstructive pulmonary disease) ([place])   . Depression    followed by a psychiatrist  . DYSLIPIDEMIA 05/01/2009  . Dyspnea   . Dysrhythmia    atrial fibrillation  . Family history of breast cancer   . Family history of colon cancer   . Family history of uterine cancer   . Furuncle 10/17/2016  . Gastroparesis   . GERD (gastroesophageal reflux disease)   . Headache   . History of breast cancer 2006   with return in 2008, s/p mastectomy, XRT and chemotherapy  . History of external beam radiation therapy 09/25/16-11/13/16   left breast treated to  50.4 Gy in 28 fractions, boost 12 Gy in 6 fractions  . Hypertension   . 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 IBS (irritable bowel syndrome)   . ICD (implantable cardiac defibrillator) in place 11/10/2012   ICD (11/2012)  . Malignant neoplasm of lower-inner quadrant of left female breast ([place]) 06/26/2016  . Myocardial infarction ([place])   . OBESITY 07/24/2007  . OBSTRUCTIVE SLEEP APNEA 07/24/2007   with CPAP compliance  . Osteoarthritis   . Persistent atrial fibrillation ([place]) 09/2014  . Personal history of radiation therapy    current  . Presence of permanent cardiac pacemaker   . RESTLESS LEG SYNDROME 07/24/2007  . SYSTOLIC HEART FAILURE, CHRONIC 01/05/2009   a. chemo induced cardiomyopathy b. cMRI (02/2009) EF 45% c. Myoview 05/2010: EF 59%, no ischemia d. RHC (01/2012) RA 20, RV 55/13/22, PA 56/34 (44), PCWP 28, Fick CO/CI: 3.1 / 1.5, PVR 5.3 WU, PA sat 42% & 48% e. ECHO (10/2012) EF 20-25%, diff HK, MV calcified annulus f. ECHO (03/2014) EF 30-35%, sev HK, inferior/inferoseptal walls, mild MR    Patient Active Problem List   Diagnosis Date Noted  . Acute on chronic systolic heart failure, NYHA class 4 ([place]) 03/06/2017  . Lung nodule 02/21/2017  . Shortness of breath   . NICM (nonischemic cardiomyopathy) ([place])   . Chronic systolic (congestive) heart failure ([place])   . Acute on chronic heart failure ([place]) 02/11/2017  . Acute heart  failure ([place]) 02/11/2017  . Hypokalemia 02/05/2017  . Atrial flutter ([place]) 02/05/2017  . Goals of care, counseling/discussion   . DNR (do not resuscitate) discussion   . Palliative care encounter   . Acute on chronic systolic (congestive) heart failure ([place]) 01/28/2017  . Acute on chronic systolic heart failure, NYHA class 3 ([place]) 01/28/2017  . Axillary mass, left 10/17/2016  . Boil 10/17/2016  . Genetic testing 08/15/2016  . Family history of breast cancer   . Family history of uterine cancer   . Family history of colon cancer   . Malignant neoplasm of  lower-inner quadrant of left female breast ([place]) 06/26/2016  . Small bowel polyp   . CHF (congestive heart failure) ([place]) 12/21/2015  . Acute blood loss anemia   . Atrial fibrillation ([place]) 12/11/2015  . Benign neoplasm of descending colon   . Upper GI bleeding   . Iron deficiency anemia due to chronic blood loss   . Acute upper GI bleeding 11/14/2015  . Anemia 11/14/2015  . Absolute anemia   . Symptomatic anemia   . Bleeding gastrointestinal   . PAF (paroxysmal atrial fibrillation) ([place]) 09/22/2015  . Chest pain at rest 08/19/2015  . Chest pain 08/19/2015  . NSVT (nonsustained ventricular tachycardia) ([place]) 06/30/2015  . Breast cancer of lower-outer quadrant of right female breast ([place]) 07/22/2014  . CAD (coronary artery disease) 02/17/2014  . 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 Cardiogenic shock ([place]) 01/31/2012  . PALPITATIONS 09/20/2010  . Mixed hypercholesterolemia and hypertriglyceridemia 05/01/2009  . HYPERTENSION, BENIGN 01/05/2009  . FOOT PAIN 11/29/2008  . Sinus tachycardia 10/25/2008  . 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 Obesity 07/24/2007  . Obstructive sleep apnea 07/24/2007  . RESTLESS LEG SYNDROME 07/24/2007    Past Surgical History:  Procedure Laterality Date  . ABDOMINAL HYSTERECTOMY  1991  . BI-VENTRICULAR IMPLANTABLE CARDIOVERTER DEFIBRILLATOR N/A 11/10/2012   SJM Forify Assura single chamber ICD  . BREAST BIOPSY Left 08/2015   benign  . BREAST BIOPSY Left 06/2016   malignant  . BREAST LUMPECTOMY Left 07/24/2016   malignant  . BREAST LUMPECTOMY WITH RADIOACTIVE SEED AND SENTINEL LYMPH NODE BIOPSY Left 07/24/2016   Procedure: LEFT BREAST LUMPECTOMY WITH RADIOACTIVE SEED AND SENTINEL LYMPH NODE BIOPSY WITH BLUE DYE INJECTION;  Surgeon: Fanny Skates, MD;  Location: [facility];  Service: General;  Laterality: Left;  . CARDIAC CATHETERIZATION N/A 08/21/2015   Procedure: Right/Left Heart Cath and  Coronary Angiography;  Surgeon: Jolaine Artist, MD;  Location: [facility];  Service: Cardiovascular;  Laterality: N/A;  . CARDIAC DEFIBRILLATOR PLACEMENT  11/10/2012   SJM Fortify Assura VR implanted by Dr Rayann Heman for primary prevention  . CARDIOVERSION N/A 02/06/2017   Procedure: CARDIOVERSION;  Surgeon: Jolaine Artist, MD;  Location: [facility] ENDOSCOPY;  Service: Cardiovascular;  Laterality: N/A;  . CHOLECYSTECTOMY    . COLONOSCOPY N/A 11/17/2015   Procedure: COLONOSCOPY;  Surgeon: Jerene Bears, MD;  Location[facility] ENDOSCOPY;  Service: Endoscopy;  Laterality: N/A;  . ENTEROSCOPY N/A 03/26/2016   Procedure: ENTEROSCOPY;  Surgeon: Manus Gunning, MD;  Location: [facility] ENDOSCOPY;  Service: Gastroenterology;  Laterality: N/A;  . ESOPHAGOGASTRODUODENOSCOPY N/A 11/16/2015   Procedure: ESOPHAGOGASTRODUODENOSCOPY (EGD);  Surgeon: Manus Gunning, MD;  Location: [facility];  Service: Gastroenterology;  Laterality: N/A;  . ESOPHAGOGASTRODUODENOSCOPY N/A 02/24/2016   Procedure: ESOPHAGOGASTRODUODENOSCOPY (EGD);  Surgeon: Milus Banister, MD;  Location: [facility];  Service: Endoscopy;  Laterality: N/A;  . ESOPHAGOGASTRODUODENOSCOPY N/A  03/26/2016   Procedure: ESOPHAGOGASTRODUODENOSCOPY (EGD);  Surgeon: Manus Gunning, MD;  Location: [facility] ENDOS[facility];  Service: Gastroenterology;  Laterality: N/A;  . EYE SURGERY     cataracts  . GIVENS CAPSULE STUDY N/A 12/11/2015   Procedure: GIVENS CAPSULE STUDY;  Surgeon: Manus Gunning, MD;  Location: [facility];  Service: Gastroenterology;  Laterality: N/A;  . IR FLUORO GUIDE CV LINE RIGHT  02/04/2017  . IR US GUIDE VASC ACCESS RIGHT  02/04/2017  . KNEE CARTILAGE SURGERY    . MASTECTOMY     right  . RADIOLOGY WITH ANESTHESIA N/A 02/02/2017   Procedure: CARDIOVERSION;  Surgeon: Radiologist, Medication, MD;  Location: [facility];  Service: Radiology;  Laterality: N/A;  . RIGHT HEART CATH N/A 02/07/2017   Procedure: Right Heart Cath;  Surgeon:  Jolaine Artist, MD;  Location: [facility];  Service: Cardiovascular;  Laterality: N/A;  . RIGHT/LEFT HEART CATH AND CORONARY ANGIOGRAPHY N/A 01/28/2017   Procedure: Right/Left Heart Cath and Coronary Angiography;  Surgeon: Jolaine Artist, MD;  Location: [facility];  Service: Cardiovascular;  Laterality: N/A;  . WRIST SURGERY Bilateral     OB History    No data available       Home Medications    Prior to Admission medications   Medication Sig Start Date End Date Taking? Authorizing Provider  sodium chloride 0.9 % SOLN with milrinone 1 MG/ML SOLN 200 mcg/mL Inject into the vein continuous.   Yes [provider]  acetaminophen (TYLENOL) 650 MG CR tablet Take 1,300 mg by mouth every 8 (eight) hours as needed for pain.    [provider]  albuterol (PROVENTIL HFA) 108 (90 BASE) MCG/ACT inhaler Inhale 2 puffs into the lungs every 6 (six) hours as needed. For shortness of breath    [provider]  ALPRAZolam (XANAX) 0.5 MG tablet Take 0.25 mg by mouth 4 (four) times daily as needed for anxiety.  11/06/15   [provider]  amiodarone (PACERONE) 200 MG tablet Take 1 tablet (200 mg total) by mouth 2 (two) times daily. 02/12/17   Shirley Friar, PA-C  apixaban (ELIQUIS) 5 MG TABS tablet Take 1 tablet (5 mg total) by mouth 2 (two) times daily. 07/11/16   Bensimhon, Shaune Pascal, MD  baclofen (LIORESAL) 20 MG tablet Take 20 mg by mouth 2 (two) times daily as needed for muscle spasms.    [provider]  busPIRone (BUSPAR) 30 MG tablet Take 30 mg by mouth 2 (two) times daily.     [provider]  Calcium Carb-Cholecalciferol (CALCIUM 600+D3) 600-800 MG-UNIT TABS Take by mouth daily.    [provider]  Cholecalciferol (VITAMIN D) 2000 units CAPS Take 1 capsule by mouth daily.    [provider]  digoxin (LANOXIN) 0.125 MG tablet Take 1 tablet (0.125 mg total) by mouth every other day. 07/11/16   Bensimhon,  Shaune Pascal, MD  docusate sodium (COLACE) 100 MG capsule Take 3 capsules (300 mg total) by mouth at bedtime. 10/03/15   Clegg, Amy D, NP  doxycycline (VIBRA-TABS) 100 MG tablet Take 1 tablet (100 mg total) by mouth every 12 (twelve) hours. Patient not taking: Reported on 03/04/2017 02/12/17   Shirley Friar, PA-C  DULoxetine (CYMBALTA) 30 MG capsule Take 30 mg by mouth daily.    Moon, Amy A, NP  fluconazole (DIFLUCAN) 200 MG tablet Take 1 tablet (200 mg total) by mouth daily. Patient not taking: Reported on 03/04/2017 02/13/17   Chalmers Cater,  Satira Mccallum, PA-C  Garlic 409 MG CAPS Take 500 mg by mouth daily after lunch.     [provider]  Glucos-Chondroit-Hyaluron-MSM (GLUCOSAMINE CHONDROITIN JOINT PO) Take 1 tablet by mouth daily.    [provider]  HUMALOG KWIKPEN 100 UNIT/ML KiwkPen Inject 12-36 Units into the skin 3 (three) times daily. On a average per patient reports taking 10 units at breakfast, 10 units lunch and 5 to 10 units at supper 10/30/15   [provider]  HUMALOG MIX 75/25 KWIKPEN (75-25) 100 UNIT/ML Kwikpen Inject 20 Units into the skin 2 (two) times daily.  11/21/15   [provider]  hydrOXYzine (ATARAX/VISTARIL) 50 MG tablet Take 50 mg by mouth every 6 (six) hours as needed for itching ((primarily taken at bedtime)).     [provider]  IRON PO Take 24 mg by mouth daily.    [provider]  levothyroxine (SYNTHROID, LEVOTHROID) 75 MCG tablet Take 75 mcg by mouth daily before breakfast.     [provider]  Melatonin 1 MG TABS Take 0.5 mg by mouth at bedtime.    [provider]  metolazone (ZAROXOLYN) 2.5 MG tablet Take 1 tablet (2.5 mg total) by mouth once a week. Every Friday and as needed. 02/18/17   Clegg, Amy D, NP  milrinone (PRIMACOR) 20 MG/100 ML SOLN infusion Inject 35.2875 mcg/min into the vein continuous. 02/07/17   Shirley Friar, PA-C  mometasone (NASONEX) 50 MCG/ACT nasal spray Place 1  spray into both nostrils daily. allergies    [provider]  Multiple Minerals (CALCIUM-MAGNESIUM-ZINC) TABS Take 1 tablet by mouth daily after lunch.     [provider]  nitroGLYCERIN (NITROSTAT) 0.4 MG SL tablet Place 1 tablet (0.4 mg total) under the tongue every 5 (five) minutes as needed. For chest pain 07/02/16   Bensimhon, Shaune Pascal, MD  Omega-3 Fatty Acids (FISH OIL) 1000 MG CAPS Take 2 capsules by mouth daily.    [provider]  pantoprazole (PROTONIX) 40 MG tablet Take 40 mg by mouth 2 (two) times daily.      [provider]  polyethylene glycol powder (GLYCOLAX/MIRALAX) powder Mix 17 grams in 8 oz of water twice daily Patient taking differently: daily. Mix 17 grams in 8 oz of water twice daily 06/04/16   Armbruster, Renelda Loma, MD  potassium chloride SA (K-DUR,KLOR-CON) 20 MEQ tablet Take 3 tablets (60 mEq total) by mouth 2 (two) times daily. 02/18/17   Clegg, Amy D, NP  Propylene Glycol (SYSTANE BALANCE) 0.6 % SOLN Place 1 drop into both eyes 3 (three) times daily.    [provider]  pyridOXINE (VITAMIN B-6) 100 MG tablet Take 100 mg by mouth daily after lunch.     [provider]  rOPINIRole (REQUIP) 1 MG tablet Take 1 mg by mouth at bedtime and may repeat dose one time if needed. IF RESTLESS LEGS PERSIST PATIENT MAY REPEAT ADDITIONAL DOSE    [provider]  rosuvastatin (CRESTOR) 10 MG tablet Take 10 mg by mouth at bedtime. 06/18/16   [provider]  Simethicone Extra Strength 125 MG CAPS Take 1 tablet by mouth daily as needed (for bloating). Reported on 02/23/2016    [provider]  spironolactone (ALDACTONE) 25 MG tablet Take 1 tablet (25 mg total) by mouth 2 (two) times daily. 02/07/17   Shirley Friar, PA-C  SUMAtriptan (IMITREX) 100 MG tablet Take 100 mg by mouth daily as needed for migraine.  11/28/15  [provider]  tiotropium (SPIRIVA) 18 MCG inhalation capsule Place 18 mcg  into inhaler and inhale daily.      [provider]  torsemide (DEMADEX) 20 MG tablet Take 3 tablets (60 mg total) by mouth 2 (two) times daily. 02/18/17   Clegg, Amy D, NP  traMADol (ULTRAM) 50 MG tablet Take 1 tablet by mouth daily as needed for severe pain.  10/15/16   [provider]  vitamin B-12 (CYANOCOBALAMIN) 1000 MCG tablet Take 1,000 mcg by mouth daily after lunch.     [provider]  vitamin C (ASCORBIC ACID) 500 MG tablet Take 500 mg by mouth daily after lunch.     [provider]  vitamin E 400 UNIT capsule Take 400 Units by mouth daily after lunch.     [provider]    Family History Family History  Problem Relation Age of Onset  . Diabetes Mellitus II Mother   . Lung cancer Mother   . Congestive Heart Failure Mother   . Coronary artery disease Father        CABG x 3  . Hypertension Father   . Diabetes Mellitus II Father   . Breast cancer Cousin 74       maternal first cousin  . Colon cancer Cousin 41       maternal first cousin  . Uterine cancer Cousin        dx in her 8s  . Colon cancer Maternal Aunt        dx in her late 62s  . Uterine cancer Maternal Aunt 66  . Lung cancer Maternal Aunt   . Uterine cancer Maternal Aunt 38  . Colon cancer Maternal Aunt 51  . Breast cancer Maternal Grandmother 89  . Uterine cancer Maternal Aunt 48  . Uterine cancer Cousin        dx in her 53s  . Uterine cancer Cousin 40    Social History Social History  Substance Use Topics  . Smoking status: Former Smoker    Packs/day: 1.50    Years: 33.00    Types: Cigarettes    Quit date: 04/12/2001  . Smokeless tobacco: Never Used     Comment: heaviest - 2.5- 3PPD x 3 years   . Alcohol use No     Allergies   Bee venom; Aspirin; Ciprofloxacin; Codeine; Naproxen; Other; Sulfonamide derivatives; Xarelto [rivaroxaban]; Atorvastatin; Nsaids; Tape; and Tresiba flextouch [insulin degludec]   Review of Systems Review of Systems    Constitutional: Negative for fever.  Respiratory: Positive for shortness of breath. Negative for cough and sputum production.   Cardiovascular: Positive for leg swelling. Negative for chest pain.  All other systems reviewed and are negative.    Physical Exam Updated Vital Signs BP 117/68   Pulse 98   Temp (!) 95.9 F (35.5 C) (Axillary)   Resp (!) 26   Ht 5\' 5"  (1.651 m)   Wt 91.6 kg (202 lb)   SpO2 94%   BMI 33.61 kg/m   Physical Exam  Constitutional: She is oriented to person, place, and time. No distress.  Patient is acutely on chronically ill appearing.  HENT:  Head: Normocephalic and atraumatic.  Neck: Normal range of motion. Neck supple.  Cardiovascular: Normal rate and regular rhythm.  Exam reveals no gallop and no friction rub.   No murmur heard. Pulmonary/Chest: She is in respiratory distress. She has no wheezes. She has rales.  Patient with rales in the bases bilaterally. She is  in moderate respiratory distress. She appears very fatigued and tired.  Abdominal: Soft. Bowel sounds are normal. She exhibits no distension. There is no tenderness.  Musculoskeletal: Normal range of motion. She exhibits edema.  There is 1+ pitting edema both lower extremities.  Neurological: She is alert and oriented to person, place, and time.  Skin: Skin is warm and dry. She is not diaphoretic.  Nursing note and vitals reviewed.    ED Treatments / Results  Labs (all labs ordered are listed, but only abnormal results are displayed) Labs Reviewed  BASIC METABOLIC PANEL - Abnormal; Notable for the following:       Result Value   Sodium 129 (*)    Potassium 7.4 (*)    Chloride 95 (*)    CO2 20 (*)    Glucose, Bld 243 (*)    BUN 50 (*)    Creatinine, Ser 2.42 (*)    GFR calc non Af Amer 20 (*)    GFR calc Af Amer 24 (*)    All other components within normal limits  CBC - Abnormal; Notable for the following:    RBC 5.15 (*)    Hemoglobin 15.1 (*)    HCT 46.9 (*)    RDW 17.9  (*)    All other components within normal limits  BRAIN NATRIURETIC PEPTIDE - Abnormal; Notable for the following:    B Natriuretic Peptide 1,084.8 (*)    All other components within normal limits  I-STAT ARTERIAL BLOOD GAS, ED - Abnormal; Notable for the following:    pCO2 arterial 25.5 (*)    Bicarbonate 16.3 (*)    Acid-base deficit 6.0 (*)    All other components within normal limits  MRSA PCR SCREENING  DIGOXIN LEVEL  COOXEMETRY PANEL  BLOOD GAS, ARTERIAL  BASIC METABOLIC PANEL  I-STAT TROPONIN, ED    EKG  EKG Interpretation  Date/Time:  Thursday March 06 2017 11:27:17 EDT Ventricular Rate:  83 PR Interval:    QRS Duration: 187 QT Interval:  433 QTC Calculation: 509 R Axis:   -99 Text Interpretation:  VENTRICULAR PACED RHYTHM Confirmed by Veryl Speak [phone]) on 03/06/2017 11:37:54 AM       Radiology Dg Chest 2 View  Result Date: 03/06/2017 CLINICAL DATA:  Shortness of Breath EXAM: CHEST  2 VIEW COMPARISON:  02/16/2017 FINDINGS: Right internal jugular PICC line and left AICD remain in place, unchanged. Cardiomegaly. Vascular congestion and diffuse interstitial prominence compatible with interstitial edema. More confluent opacity noted in the infrahilar regions bilaterally, likely edema although infection cannot be excluded. No effusions. No acute bony abnormality. IMPRESSION: Mild CHF. More focal infrahilar opacities bilaterally could reflect edema or infection. Electronically Signed   By: Rolm Baptise M.D.   On: 03/06/2017 12:16   Nm Pet Image Initial (pi) Skull Base To Thigh  Result Date: 03/06/2017 CLINICAL DATA:  Initial initial treatment strategy for left lung nodules and adrenal mass. Personal history of right breast carcinoma. EXAM: NUCLEAR MEDICINE PET SKULL BASE TO THIGH TECHNIQUE: 11.3 mCi F-18 FDG was injected intravenously. Full-ring PET imaging was performed from the skull base to thigh after the radiotracer. CT data was obtained and used for attenuation  correction and anatomic localization. FASTING BLOOD GLUCOSE:  Value: 203 mg/dl COMPARISON:  CT on 01/23/2017 and PET-CT on 06/25/2007 FINDINGS: NECK:  No hypermetabolic lymph nodes or masses. CHEST: Prior right mastectomy. No hypermetabolic axillary lymph nodes identified. 2.1 cm right paratracheal lymph node shows mild FDG uptake with SUV max of 4.4.  1.6 cm subcarinal lymph node shows mild FDG uptake with SUV max of 3.6. Mild lymphadenopathy in lateral aortic region shows no significant hypermetabolic activity. No hypermetabolic activity seen in the hilar or axillary regions. 13 mm left upper lobe pulmonary nodule is stable compared to previous studies dating back to 2008 and shows no FDG uptake, consistent with benign etiology. Stable mild emphysema. Small bilateral pleural effusions are new since recent CT on 01/23/2017. Increased diffuse pulmonary interstitial prominence is suspicious for mild pulmonary edema. Stable cardiomegaly and transvenous pacemaker. ABDOMEN/PELVIS: No abnormal hypermetabolic activity within the liver, pancreas, adrenal glands, or spleen. No hypermetabolic lymph nodes in the abdomen or pelvis. 1.7 cm left adrenal nodule shows mild FDG uptake and is stable compared to prior studies dating back to 2008, consistent with benign adrenal adenoma. Colonic diverticulosis, without radiographic evidence of diverticulitis. Prior hysterectomy. Small amount of free fluid noted in pelvic cul-de-sac. Aortic atherosclerosis. SKELETON: No focal hypermetabolic bone lesions to suggest skeletal metastasis. IMPRESSION: Stable 13 mm left upper lobe pulmonary nodule shows no hypermetabolic activity, consistent with benign etiology. Stable benign left adrenal adenoma. New diffuse pulmonary interstitial infiltrates and small bilateral pleural effusions, suspicious for mild congestive heart failure. Mild hypermetabolic mediastinal lymphadenopathy in right paratracheal and subcarinal regions, which could be reactive  or neoplastic in etiology. Consider further evaluation with EBUS or short-term followup by CT in 3 months . Aortic Atherosclerosis (ICD10-I70.0) and Emphysema (ICD10-J43.9). Electronically Signed   By: Earle Gell M.D.   On: 03/06/2017 09:05    Procedures Procedures (including critical care time)  Medications Ordered in ED Medications  sodium chloride flush (NS) 0.9 % injection 3 mL (10 mLs Intravenous Given 03/06/17 1528)  sodium chloride flush (NS) 0.9 % injection 3 mL (not administered)  0.9 %  sodium chloride infusion (not administered)  acetaminophen (TYLENOL) tablet 650 mg (not administered)  ondansetron (ZOFRAN) injection 4 mg (not administered)  furosemide (LASIX) injection 80 mg (not administered)  sodium polystyrene (KAYEXALATE) 15 GM/60ML suspension 30 g (not administered)  milrinone (PRIMACOR) 20 MG/100 ML (0.2 mg/mL) infusion (not administered)  furosemide (LASIX) injection 40 mg (40 mg Intravenous Given 03/06/17 1251)  fentaNYL (SUBLIMAZE) injection 25 mcg (25 mcg Intravenous Given 03/06/17 1319)  dextrose 50 % solution (50 mLs  Given 03/06/17 1325)  insulin aspart (novoLOG) 100 UNIT/ML injection (10 Units  Given 03/06/17 1325)  calcium gluconate 10 % injection (1,000 mg  Given 03/06/17 1320)     Initial Impression / Assessment and Plan / ED Course  I have reviewed the triage vital signs and the nursing notes.  Pertinent labs & imaging results that were available during my care of the patient were reviewed by me and considered in my medical decision making (see chart for details).  Patient arrives here in respiratory distress. She has significant past medical history as listed above. Due to the acuity of the patient's condition, I immediately consulted cardiology to include them in her care.  She was given IV Lasix and placed on BiPAP due to increased work of breathing and extreme fatigue. Laboratory studies revealed a potassium of 7.4 which was treated with calcium and Kayexalate.  She will be admitted to the cardiac intensive care unit.  CRITICAL CARE Performed by: Veryl Speak Total critical care time: 35 minutes Critical care time was exclusive of separately billable procedures and treating other patients. Critical care was necessary to treat or prevent imminent or life-threatening deterioration. Critical care was time spent personally by me on the following activities:  development of treatment plan with patient and/or surrogate as well as nursing, discussions with consultants, evaluation of patient's response to treatment, examination of patient, obtaining history from patient or surrogate, ordering and performing treatments and interventions, ordering and review of laboratory studies, ordering and review of radiographic studies, pulse oximetry and re-evaluation of patient's condition.   Final Clinical Impressions(s) / ED Diagnoses   Final diagnoses:  None    New Prescriptions Current Discharge Medication List       Veryl Speak, MD 03/06/17 1550

## 2017-03-06 NOTE — H&P (Signed)
Advanced Heart Failure Team History and Physical Note   Primary Physician:  [name] NP Primary Cardiologist:  Dr Haroldine Laws Oncologist: Dr Humphrey Rolls   Reason for Admission: Acute/Chronic Heart Failure    HPI:    Ms Gailey is a 61 year old with a complex history of R breast cancer/L breast cancer, obesity, htn, dmii, OSA, GI bleed  With AVMs 2017, chronic a fib, chemo induced cardiomyopathy, St Jude single chanmber ICD, chronic systolic heart failure on 0.5 mcg of milrinone admitted acute on chronic systolic heart failure in the setting of missed diuretics.   Over the last few days she haas had increased dyspnea. Her sister says she has been confused and has missed 3 days of torsemide and may have messed up her other medications. + Orthopnea + PND. SOB at rest and with exertion. Today her sister called HF clinic and reported increased dyspnea. She was instructed to call EMS.   In the ED she was acutely sob and lethargic. ECG with chronic AF but very wide complex. Given 120 mg IV lasix. Potassium was > 7.  Given insulin, calcium, kayexylate, D 50.    ECHO 12/2016 EF 15% RV Normal  Review of Systems: [y] = yes, _0  = no History obtained from chart and her sister due to acute respiratory failure.   General: Weight gain _1 Y; Weight loss _2 ; Anorexia _3 ; Fatigue [Y ]; Fever _4 ; Chills _5 ; Weakness _6   Cardiac: Chest pain/pressure _7 ; Resting SOB [Y ]; Exertional SOB [Y ]; Orthopnea [ Y]; Pedal Edema _8 ; Palpitations _9 ; Syncope _10 ; Presyncope _11 ; Paroxysmal nocturnal dyspnea_12   Pulmonary: Cough _13 ; Wheezing_14 ; Hemoptysis_15 ; Sputum _16 ; Snoring _17   GI: Vomiting_18 ; Dysphagia_19 ; Melena_20 ; Hematochezia _21 ; Heartburn_22 ; Abdominal pain _23 ; Constipation _24 ; Diarrhea _25 ; BRBPR _26   GU: Hematuria_27 ; Dysuria _28 ; Nocturia_29   Vascular: Pain in legs with walking _30 ; Pain in feet with lying flat _31 ; Non-healing sores _32 ; Stroke _33 ; TIA _34 ; Slurred speech _35 ;  Neuro:  Headaches_36 ; Vertigo_37 ; Seizures_38 ; Paresthesias_39 ;Blurred vision _40 ; Diplopia _41 ; Vision changes _42   Ortho/Skin: Arthritis [ Y]; Joint pain [Y]; Muscle pain _43 ; Joint swelling _44 ; Back Pain [Y ]; Rash _45   Psych: Depression_46 ; Anxiety_47   Heme: Bleeding problems _48 ; Clotting disorders _49 ; Anemia _50   Endocrine: Diabetes [Y ]; Thyroid dysfunction_51    Home Medications Prior to Admission medications   Medication Sig Start Date End Date Taking? Authorizing Provider  acetaminophen (TYLENOL) 650 MG CR tablet Take 1,300 mg by mouth every 8 (eight) hours as needed for pain.    [provider]  albuterol (PROVENTIL HFA) 108 (90 BASE) MCG/ACT inhaler Inhale 2 puffs into the lungs every 6 (six) hours as needed. For shortness of breath    [provider]  ALPRAZolam (XANAX) 0.5 MG tablet Take 0.25 mg by mouth 4 (four) times daily as needed for anxiety.  11/06/15   [provider]  amiodarone (PACERONE) 200 MG tablet Take 1 tablet (200 mg total) by mouth 2 (two) times daily. 02/12/17   Shirley Friar, PA-C  apixaban (ELIQUIS) 5 MG TABS tablet Take 1 tablet (5 mg total) by mouth 2 (two) times daily. 07/11/16   Santasia Rew, Shaune Pascal, MD  baclofen (LIORESAL) 20 MG tablet Take 20  mg by mouth 2 (two) times daily as needed for muscle spasms.    [provider]  busPIRone (BUSPAR) 30 MG tablet Take 30 mg by mouth 2 (two) times daily.     [provider]  Calcium Carb-Cholecalciferol (CALCIUM 600+D3) 600-800 MG-UNIT TABS Take by mouth daily.    [provider]  Cholecalciferol (VITAMIN D) 2000 units CAPS Take 1 capsule by mouth daily.    [provider]  digoxin (LANOXIN) 0.125 MG tablet Take 1 tablet (0.125 mg total) by mouth every other day. 07/11/16   Gaylen Venning, Shaune Pascal, MD  docusate sodium (COLACE) 100 MG capsule Take 3 capsules (300 mg total) by mouth at bedtime. 10/03/15   Clegg, Amy D, NP  doxycycline (VIBRA-TABS) 100 MG tablet  Take 1 tablet (100 mg total) by mouth every 12 (twelve) hours. Patient not taking: Reported on 03/04/2017 02/12/17   Shirley Friar, PA-C  DULoxetine (CYMBALTA) 30 MG capsule Take 30 mg by mouth daily.    Moon, Amy A, NP  fluconazole (DIFLUCAN) 200 MG tablet Take 1 tablet (200 mg total) by mouth daily. Patient not taking: Reported on 03/04/2017 02/13/17   Shirley Friar, PA-C  Garlic 960 MG CAPS Take 500 mg by mouth daily after lunch.     [provider]  Glucos-Chondroit-Hyaluron-MSM (GLUCOSAMINE CHONDROITIN JOINT PO) Take 1 tablet by mouth daily.    [provider]  HUMALOG KWIKPEN 100 UNIT/ML KiwkPen Inject 12-36 Units into the skin 3 (three) times daily. On a average per patient reports taking 10 units at breakfast, 10 units lunch and 5 to 10 units at supper 10/30/15   [provider]  HUMALOG MIX 75/25 KWIKPEN (75-25) 100 UNIT/ML Kwikpen Inject 20 Units into the skin 2 (two) times daily.  11/21/15   [provider]  hydrOXYzine (ATARAX/VISTARIL) 50 MG tablet Take 50 mg by mouth every 6 (six) hours as needed for itching ((primarily taken at bedtime)).     [provider]  IRON PO Take 24 mg by mouth daily.    [provider]  levothyroxine (SYNTHROID, LEVOTHROID) 75 MCG tablet Take 75 mcg by mouth daily before breakfast.     [provider]  Melatonin 1 MG TABS Take 0.5 mg by mouth at bedtime.    [provider]  metolazone (ZAROXOLYN) 2.5 MG tablet Take 1 tablet (2.5 mg total) by mouth once a week. Every Friday and as needed. 02/18/17   Clegg, Amy D, NP  milrinone (PRIMACOR) 20 MG/100 ML SOLN infusion Inject 35.2875 mcg/min into the vein continuous. 02/07/17   Shirley Friar, PA-C  mometasone (NASONEX) 50 MCG/ACT nasal spray Place 1 spray into both nostrils daily. allergies    [provider]  Multiple Minerals (CALCIUM-MAGNESIUM-ZINC) TABS Take 1 tablet by mouth daily after lunch.     [provider]  nitroGLYCERIN (NITROSTAT) 0.4 MG SL tablet Place 1 tablet (0.4 mg total) under the tongue every 5 (five) minutes as needed. For chest pain 07/02/16   Pal Shell, Shaune Pascal, MD  Omega-3 Fatty Acids (FISH OIL) 1000 MG CAPS Take 2 capsules by mouth daily.    [provider]  pantoprazole (PROTONIX) 40 MG tablet Take 40 mg by mouth 2 (two) times daily.      [provider]  polyethylene glycol powder (GLYCOLAX/MIRALAX) powder Mix 17 grams in 8 oz of water twice daily Patient taking differently: daily. Mix 17 grams in 8 oz of water twice daily 06/04/16   Inverness Cellar  Eddie Dibbles, MD  potassium chloride SA (K-DUR,KLOR-CON) 20 MEQ tablet Take 3 tablets (60 mEq total) by mouth 2 (two) times daily. 02/18/17   Clegg, Amy D, NP  Propylene Glycol (SYSTANE BALANCE) 0.6 % SOLN Place 1 drop into both eyes 3 (three) times daily.    [provider]  pyridOXINE (VITAMIN B-6) 100 MG tablet Take 100 mg by mouth daily after lunch.     [provider]  rOPINIRole (REQUIP) 1 MG tablet Take 1 mg by mouth at bedtime and may repeat dose one time if needed. IF RESTLESS LEGS PERSIST PATIENT MAY REPEAT ADDITIONAL DOSE    [provider]  rosuvastatin (CRESTOR) 10 MG tablet Take 10 mg by mouth at bedtime. 06/18/16   [provider]  Simethicone Extra Strength 125 MG CAPS Take 1 tablet by mouth daily as needed (for bloating). Reported on 02/23/2016    [provider]  spironolactone (ALDACTONE) 25 MG tablet Take 1 tablet (25 mg total) by mouth 2 (two) times daily. 02/07/17   Shirley Friar, PA-C  SUMAtriptan (IMITREX) 100 MG tablet Take 100 mg by mouth daily as needed for migraine.  11/28/15   [provider]  tiotropium (SPIRIVA) 18 MCG inhalation capsule Place 18 mcg into inhaler and inhale daily.      [provider]  torsemide (DEMADEX) 20 MG tablet Take 3 tablets (60 mg total) by mouth 2 (two) times daily. 02/18/17   Clegg, Amy D,  NP  traMADol (ULTRAM) 50 MG tablet Take 1 tablet by mouth daily as needed for severe pain.  10/15/16   [provider]  vitamin B-12 (CYANOCOBALAMIN) 1000 MCG tablet Take 1,000 mcg by mouth daily after lunch.     [provider]  vitamin C (ASCORBIC ACID) 500 MG tablet Take 500 mg by mouth daily after lunch.     [provider]  vitamin E 400 UNIT capsule Take 400 Units by mouth daily after lunch.     [provider]    Past Medical History: Past Medical History:  Diagnosis Date  . AICD (automatic cardioverter/defibrillator) present    st jude  . Anxiety   . 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 Asthma   . Axillary mass, left 10/17/2016  . Breast cancer ([place]) 2005, 2017  . CHF (congestive heart failure) ([place])   . COPD (chronic obstructive pulmonary disease) ([place])   . Depression    followed by a psychiatrist  . DYSLIPIDEMIA 05/01/2009  . Dyspnea   . Dysrhythmia    atrial fibrillation  . Family history of breast cancer   . Family history of colon cancer   . Family history of uterine cancer   . Furuncle 10/17/2016  . Gastroparesis   . GERD (gastroesophageal reflux disease)   . Headache   . History of breast cancer 2006   with return in 2008, s/p mastectomy, XRT and chemotherapy  . History of external beam radiation therapy 09/25/16-11/13/16   left breast treated to 50.4 Gy in 28 fractions, boost 12 Gy in 6 fractions  . Hypertension   . 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 IBS (irritable bowel syndrome)   . ICD (implantable cardiac defibrillator) in place 11/10/2012   ICD (11/2012)  . Malignant neoplasm of lower-inner quadrant of left female breast ([place]) 06/26/2016  . Myocardial infarction ([place])   . OBESITY 07/24/2007  . OBSTRUCTIVE SLEEP APNEA 07/24/2007   with CPAP compliance  . Osteoarthritis   . Persistent atrial fibrillation ([place]) 09/2014  . Personal history of radiation  therapy    current  . Presence of permanent cardiac pacemaker   . RESTLESS LEG SYNDROME  07/24/2007  . SYSTOLIC HEART FAILURE, CHRONIC 01/05/2009   a. chemo induced cardiomyopathy b. cMRI (02/2009) EF 45% c. Myoview 05/2010: EF 59%, no ischemia d. RHC (01/2012) RA 20, RV 55/13/22, PA 56/34 (44), PCWP 28, Fick CO/CI: 3.1 / 1.5, PVR 5.3 WU, PA sat 42% & 48% e. ECHO (10/2012) EF 20-25%, diff HK, MV calcified annulus f. ECHO (03/2014) EF 30-35%, sev HK, inferior/inferoseptal walls, mild MR    Past Surgical History: Past Surgical History:  Procedure Laterality Date  . ABDOMINAL HYSTERECTOMY  1991  . BI-VENTRICULAR IMPLANTABLE CARDIOVERTER DEFIBRILLATOR N/A 11/10/2012   SJM Forify Assura single chamber ICD  . BREAST BIOPSY Left 08/2015   benign  . BREAST BIOPSY Left 06/2016   malignant  . BREAST LUMPECTOMY Left 07/24/2016   malignant  . BREAST LUMPECTOMY WITH RADIOACTIVE SEED AND SENTINEL LYMPH NODE BIOPSY Left 07/24/2016   Procedure: LEFT BREAST LUMPECTOMY WITH RADIOACTIVE SEED AND SENTINEL LYMPH NODE BIOPSY WITH BLUE DYE INJECTION;  Surgeon: Fanny Skates, MD;  Location: [facility];  Service: General;  Laterality: Left;  . CARDIAC CATHETERIZATION N/A 08/21/2015   Procedure: Right/Left Heart Cath and Coronary Angiography;  Surgeon: Jolaine Artist, MD;  Location: [facility];  Service: Cardiovascular;  Laterality: N/A;  . CARDIAC DEFIBRILLATOR PLACEMENT  11/10/2012   SJM Fortify Assura VR implanted by Dr Rayann Heman for primary prevention  . CARDIOVERSION N/A 02/06/2017   Procedure: CARDIOVERSION;  Surgeon: Jolaine Artist, MD;  Location: [facility] ENDOSCOPY;  Service: Cardiovascular;  Laterality: N/A;  . CHOLECYSTECTOMY    . COLONOSCOPY N/A 11/17/2015   Procedure: COLONOSCOPY;  Surgeon: Jerene Bears, MD;  Location: [facility] ENDOSCOPY;  Service: Endoscopy;  Laterality: N/A;  . ENTEROSCOPY N/A 03/26/2016   Procedure: ENTEROSCOPY;  Surgeon: Manus Gunning, MD;  Location: [facility] ENDOSCOPY;  Service: Gastroenterology;  Laterality: N/A;  . ESOPHAGOGASTRODUODENOSCOPY N/A 11/16/2015   Procedure:  ESOPHAGOGASTRODUODENOSCOPY (EGD);  Surgeon: Manus Gunning, MD;  Location: [facility];  Service: Gastroenterology;  Laterality: N/A;  . ESOPHAGOGASTRODUODENOSCOPY N/A 02/24/2016   Procedure: ESOPHAGOGASTRODUODENOSCOPY (EGD);  Surgeon: Milus Banister, MD;  Location: [facility];  Service: Endoscopy;  Laterality: N/A;  . ESOPHAGOGASTRODUODENOSCOPY N/A 03/26/2016   Procedure: ESOPHAGOGASTRODUODENOSCOPY (EGD);  Surgeon: Manus Gunning, MD;  Location: [facility];  Service: Gastroenterology;  Laterality: N/A;  . EYE SURGERY     cataracts  . GIVENS CAPSULE STUDY N/A 12/11/2015   Procedure: GIVENS CAPSULE STUDY;  Surgeon: Manus Gunning, MD;  Location: [facility];  Service: Gastroenterology;  Laterality: N/A;  . IR FLUORO GUIDE CV LINE RIGHT  02/04/2017  . IR US GUIDE VASC ACCESS RIGHT  02/04/2017  . KNEE CARTILAGE SURGERY    . MASTECTOMY     right  . RADIOLOGY WITH ANESTHESIA N/A 02/02/2017   Procedure: CARDIOVERSION;  Surgeon: Radiologist, Medication, MD;  Location: [facility];  Service: Radiology;  Laterality: N/A;  . RIGHT HEART CATH N/A 02/07/2017   Procedure: Right Heart Cath;  Surgeon: Jolaine Artist, MD;  Location: [place];  Service: Cardiovascular;  Laterality: N/A;  . RIGHT/LEFT HEART CATH AND CORONARY ANGIOGRAPHY N/A 01/28/2017   Procedure: Right/Left Heart Cath and Coronary Angiography;  Surgeon: Jolaine Artist, MD;  Location: [facility];  Service: Cardiovascular;  Laterality: N/A;  . WRIST SURGERY Bilateral     Family History:  Family History  Problem Relation Age of Onset  .  Diabetes Mellitus II Mother   . Lung cancer Mother   . Congestive Heart Failure Mother   . Coronary artery disease Father        CABG x 3  . Hypertension Father   . Diabetes Mellitus II Father   . Breast cancer Cousin 80       maternal first cousin  . Colon cancer Cousin 35       maternal first cousin  . Uterine cancer Cousin        dx in her 37s  . Colon  cancer Maternal Aunt        dx in her late 66s  . Uterine cancer Maternal Aunt 66  . Lung cancer Maternal Aunt   . Uterine cancer Maternal Aunt 38  . Colon cancer Maternal Aunt 59  . Breast cancer Maternal Grandmother 44  . Uterine cancer Maternal Aunt 48  . Uterine cancer Cousin        dx in her 60s  . Uterine cancer Cousin 71    Social History: Social History   Social History  . Marital status: Divorced    Spouse name: N/A  . Number of children: 1  . Years of education: N/A   Occupational History  . Disabled Unemployed   Social History Main Topics  . Smoking status: Former Smoker    Packs/day: 1.50    Years: 33.00    Types: Cigarettes    Quit date: 04/12/2001  . Smokeless tobacco: Never Used     Comment: heaviest - 2.5- 3PPD x 3 years   . Alcohol use No  . Drug use: No  . Sexual activity: Not Currently   Other Topics Concern  . None   Social History Narrative   Lives with mother and brother Philis Nettle          Allergies:  Allergies  Allergen Reactions  . Bee Venom Anaphylaxis  . Aspirin Hives  . Ciprofloxacin Hives  . Codeine Nausea Only    "can take ONLY if she eats with med"  . Naproxen Hives  . Other Other (See Comments)    ANY TYPES OF METAL-CAUSES BLISTERS SKIN AND ITCHING   . Sulfonamide Derivatives Hives  . Xarelto [Rivaroxaban] Hives  . Atorvastatin Itching  . Nsaids Itching and Rash  . Tape Rash    Also allergic to metal  . Tyler Aas Flextouch [Insulin Degludec] Itching and Rash    Objective:    Vital Signs:   Temp:  [95.9 F (35.5 C)] 95.9 F (35.5 C) (08/02 1130) Pulse Rate:  [82-93] 93 (08/02 1215) Resp:  [25-30] 27 (08/02 1131) BP: (103-122)/(66-79) 122/79 (08/02 1215) SpO2:  [91 %-93 %] 91 % (08/02 1215) Weight:  [202 lb (91.6 kg)] 202 lb (91.6 kg) (08/02 1129)   Filed Weights   03/06/17 1129  Weight: 202 lb (91.6 kg)     Physical Exam     General:  Critically ill appearing Dyspneic at rest.  HEENT: Normal. Poor  dentition Neck: Supple. JVP to jaw. Carotids 2+ bilat; no bruits. No lymphadenopathy or thyromegaly appreciated. Cor: PMI nondisplaced. Irregular rate & rhythm. No rubs,  Murmurs. + S3 R upper chest tunneled  Lungs: Crackles RML RLL LLL  Abdomen: Soft, nontender, ++ distended. No hepatosplenomegaly. No bruits or masses. Good bowel sounds. Extremities: No cyanosis, clubbing, rash, R and LLE 1+edema Neuro: Gasping for air. MAE.   Telemetry   Wide Complex A fib 100s Personally reviewed   EKG   A fib 104 in  the ED very wide QRS.   Labs     Basic Metabolic Panel: No results for input(s): NA, K, CL, CO2, GLUCOSE, BUN, CREATININE, CALCIUM, MG, PHOS in the last 168 hours.  Liver Function Tests: No results for input(s): AST, ALT, ALKPHOS, BILITOT, PROT, ALBUMIN in the last 168 hours. No results for input(s): LIPASE, AMYLASE in the last 168 hours. No results for input(s): AMMONIA in the last 168 hours.  CBC:  Recent Labs Lab 03/06/17 1156  WBC 8.1  HGB 15.1*  HCT 46.9*  MCV 91.1  PLT 352    Cardiac Enzymes: No results for input(s): CKTOTAL, CKMB, CKMBINDEX, TROPONINI in the last 168 hours.  BNP: BNP (last 3 results)  Recent Labs  02/11/17 0019 02/16/17 2227 02/18/17 1229  BNP 839.7* 658.4* 668.5*    ProBNP (last 3 results) No results for input(s): PROBNP in the last 8760 hours.   CBG:  Recent Labs Lab 03/05/17 1359  GLUCAP 208*    Coagulation Studies: No results for input(s): LABPROT, INR in the last 72 hours.  Imaging: Dg Chest 2 View  Result Date: 03/06/2017 CLINICAL DATA:  Shortness of Breath EXAM: CHEST  2 VIEW COMPARISON:  02/16/2017 FINDINGS: Right internal jugular PICC line and left AICD remain in place, unchanged. Cardiomegaly. Vascular congestion and diffuse interstitial prominence compatible with interstitial edema. More confluent opacity noted in the infrahilar regions bilaterally, likely edema although infection cannot be excluded. No  effusions. No acute bony abnormality. IMPRESSION: Mild CHF. More focal infrahilar opacities bilaterally could reflect edema or infection. Electronically Signed   By: Rolm Baptise M.D.   On: 03/06/2017 12:16   Nm Pet Image Initial (pi) Skull Base To Thigh  Result Date: 03/06/2017 CLINICAL DATA:  Initial initial treatment strategy for left lung nodules and adrenal mass. Personal history of right breast carcinoma. EXAM: NUCLEAR MEDICINE PET SKULL BASE TO THIGH TECHNIQUE: 11.3 mCi F-18 FDG was injected intravenously. Full-ring PET imaging was performed from the skull base to thigh after the radiotracer. CT data was obtained and used for attenuation correction and anatomic localization. FASTING BLOOD GLUCOSE:  Value: 203 mg/dl COMPARISON:  CT on 01/23/2017 and PET-CT on 06/25/2007 FINDINGS: NECK:  No hypermetabolic lymph nodes or masses. CHEST: Prior right mastectomy. No hypermetabolic axillary lymph nodes identified. 2.1 cm right paratracheal lymph node shows mild FDG uptake with SUV max of 4.4. 1.6 cm subcarinal lymph node shows mild FDG uptake with SUV max of 3.6. Mild lymphadenopathy in lateral aortic region shows no significant hypermetabolic activity. No hypermetabolic activity seen in the hilar or axillary regions. 13 mm left upper lobe pulmonary nodule is stable compared to previous studies dating back to 2008 and shows no FDG uptake, consistent with benign etiology. Stable mild emphysema. Small bilateral pleural effusions are new since recent CT on 01/23/2017. Increased diffuse pulmonary interstitial prominence is suspicious for mild pulmonary edema. Stable cardiomegaly and transvenous pacemaker. ABDOMEN/PELVIS: No abnormal hypermetabolic activity within the liver, pancreas, adrenal glands, or spleen. No hypermetabolic lymph nodes in the abdomen or pelvis. 1.7 cm left adrenal nodule shows mild FDG uptake and is stable compared to prior studies dating back to 2008, consistent with benign adrenal adenoma.  Colonic diverticulosis, without radiographic evidence of diverticulitis. Prior hysterectomy. Small amount of free fluid noted in pelvic cul-de-sac. Aortic atherosclerosis. SKELETON: No focal hypermetabolic bone lesions to suggest skeletal metastasis. IMPRESSION: Stable 13 mm left upper lobe pulmonary nodule shows no hypermetabolic activity, consistent with benign etiology. Stable benign left adrenal adenoma. New diffuse pulmonary  interstitial infiltrates and small bilateral pleural effusions, suspicious for mild congestive heart failure. Mild hypermetabolic mediastinal lymphadenopathy in right paratracheal and subcarinal regions, which could be reactive or neoplastic in etiology. Consider further evaluation with EBUS or short-term followup by CT in 3 months . Aortic Atherosclerosis (ICD10-I70.0) and Emphysema (ICD10-J43.9). Electronically Signed   By: Earle Gell M.D.   On: 03/06/2017 09:05      Patient Profile   Ms Donahoo is 61 year old with NICM, chronic systolic heart on  Milrinone 0.5 mcg, DMII, CKD, and h/o bilateral breast cancer admitted with marked volume overload, acute respiratory failure, and hyperkalemia. .  Assessment/Plan   1. A/C Systolic Heart Failure -Admit with class IV symptoms - Given 80 mg IV lasix in the ED. Continue milrinone 0.5 mcg. Check CO-OX now .Hold spiro and dig. Check dig level now.  Follow CVP . Considered for possible LVAD but will need see how things go.  2. Acute Respiratory Failure - BIPAP in the ED. Low threshold to intubate.  3. Hyperkalemia- K 7.4. Given insulin, calcium, and kayexylate.  4.  AKI on CKD- creatinine elevated 2.4 . Watch closely . 5.  Chronic A fib- hold off amio with wide complex a fib.  6. DMII- check sliding scale 7. H/O Bilateral Breast Cancer - Recent PET scan with hypermetabolic lymphadenopathy. Reactive (CHF) vs recurrent malignancy.  Admit to ICU. Hold po meds for now due to concern airway protection. Discussed with family. For now  continue full code.    Darrick Grinder, NP 03/06/2017, 12:55 PM  Advanced Heart Failure Team Pager [phone] (M-F; 7a - 4p)  Please contact [facility] for night-coverage after hours (4p -7a ) and weekends on [email or web address]  Agree.   She is critically il. 62 y/o with end-stage systolic HF currently being worked up for possible VAD. On high-dose milrinone at home presents with severe HF, acrute respiratory failure, hyperkalemia and AKI.   We met her in the ER and found her to be in severe respiratory failure. Temporized with BIPAP and given 154m lasix. Also QRS very wide. ICD interrogated personally and no VT. Found to have K 7.4. Treated with calcium, D50, insulin. Kayexalate pending.   Discussed GOC with her and her sister. Said if PET scan from yesterday without recurrent CA she would want aggressive therapy for now. We reviewed PET with them which showed - hypermetabolic lymphadenopathy. Reactive (CHF) vs recurrent malignancy. (I suspect CHF).   She improved with BIPAP and calcium. Being transported to ICU. Continue milrinone and diuresis. Check co-ox.   Exam Critically ill appearing  Dyspneic RR 40s Cor irregular tachy Lungs crackles Ab distended NT Good bs Ext warm 2+ edema Neuro lethargic but improving  Plan as above. D/w ER MD Dr. DStark Jock   CRITICAL CARE Performed by: BGlori Bickers Total critical care time: 60 minutes  Critical care time was exclusive of separately billable procedures and treating other patients.  Critical care was necessary to treat or prevent imminent or life-threatening deterioration.  Critical care was time spent personally by me (independent of midlevel providers or residents) on the following activities: development of treatment plan with patient and/or surrogate as well as nursing, discussions with consultants, evaluation of patient's response to treatment, examination of patient, obtaining history from patient or surrogate, ordering and performing  treatments and interventions, ordering and review of laboratory studies, ordering and review of radiographic studies, pulse oximetry and re-evaluation of patient's condition.  BGlori Bickers MD  2:20 PM

## 2017-03-06 NOTE — Progress Notes (Signed)
   L groin with pus from indurated area.   Culture L groin.   Amy Clegg NP-C  4:21 PM

## 2017-03-06 NOTE — Progress Notes (Signed)
CRITICAL VALUE ALERT  Critical Value: K+ 6.8  Provider:  Dr. Haroldine Laws who is already aware;  This value less than last value and patient just received dose of kayexelate.    Provider Notified: [email or web address]   Orders Received/Actions taken: already aware; pt received kayexelate

## 2017-03-06 NOTE — ED Triage Notes (Signed)
Pt in from home via [facility] c/o sob since last night. Pt wears CPAP at night, took Spiriva this am. 90% on RA, placed on 2L, sats now 94% reported lungs clear. Hx of CHF, COPD, Afib, has continuous milrinone gtt. BP 255/200, HR 104. Heart failure clinic aware

## 2017-03-07 ENCOUNTER — Encounter (HOSPITAL_COMMUNITY): Payer: Medicare Other | Admitting: Internal Medicine

## 2017-03-07 ENCOUNTER — Encounter: Payer: Self-pay | Admitting: Cardiology

## 2017-03-07 DIAGNOSIS — I482 Chronic atrial fibrillation: Secondary | ICD-10-CM

## 2017-03-07 DIAGNOSIS — E875 Hyperkalemia: Secondary | ICD-10-CM

## 2017-03-07 DIAGNOSIS — Z515 Encounter for palliative care: Secondary | ICD-10-CM

## 2017-03-07 DIAGNOSIS — N179 Acute kidney failure, unspecified: Secondary | ICD-10-CM

## 2017-03-07 LAB — GLUCOSE, CAPILLARY
GLUCOSE-CAPILLARY: 173 mg/dL — AB (ref 65–99)
GLUCOSE-CAPILLARY: 215 mg/dL — AB (ref 65–99)
GLUCOSE-CAPILLARY: 298 mg/dL — AB (ref 65–99)
Glucose-Capillary: 234 mg/dL — ABNORMAL HIGH (ref 65–99)

## 2017-03-07 LAB — BASIC METABOLIC PANEL
Anion gap: 9 (ref 5–15)
BUN: 52 mg/dL — AB (ref 6–20)
CALCIUM: 9.3 mg/dL (ref 8.9–10.3)
CHLORIDE: 98 mmol/L — AB (ref 101–111)
CO2: 27 mmol/L (ref 22–32)
CREATININE: 2.12 mg/dL — AB (ref 0.44–1.00)
GFR, EST AFRICAN AMERICAN: 28 mL/min — AB (ref >60)
GFR, EST NON AFRICAN AMERICAN: 24 mL/min — AB (ref >60)
Glucose, Bld: 191 mg/dL — ABNORMAL HIGH (ref 65–99)
Potassium: 4.1 mmol/L (ref 3.5–5.1)
SODIUM: 134 mmol/L — AB (ref 135–145)

## 2017-03-07 LAB — COOXEMETRY PANEL
CARBOXYHEMOGLOBIN: 1.2 % (ref 0.5–1.5)
METHEMOGLOBIN: 0.8 % (ref 0.0–1.5)
O2 SAT: 66.9 %
Total hemoglobin: 20.4 g/dL — ABNORMAL HIGH (ref 12.0–16.0)

## 2017-03-07 LAB — MAGNESIUM: MAGNESIUM: 2.2 mg/dL (ref 1.7–2.4)

## 2017-03-07 MED ORDER — DIGOXIN 125 MCG PO TABS
0.0625 mg | ORAL_TABLET | Freq: Every day | ORAL | Status: DC
  Administered 2017-03-08 – 2017-03-11 (×4): 0.0625 mg via ORAL
  Filled 2017-03-07 (×4): qty 1

## 2017-03-07 MED ORDER — AMIODARONE HCL IN DEXTROSE 360-4.14 MG/200ML-% IV SOLN
30.0000 mg/h | INTRAVENOUS | Status: DC
  Administered 2017-03-07 – 2017-03-11 (×7): 30 mg/h via INTRAVENOUS
  Filled 2017-03-07 (×8): qty 200

## 2017-03-07 MED ORDER — ROSUVASTATIN CALCIUM 10 MG PO TABS
10.0000 mg | ORAL_TABLET | Freq: Every day | ORAL | Status: DC
  Administered 2017-03-07 – 2017-03-11 (×5): 10 mg via ORAL
  Filled 2017-03-07 (×5): qty 1

## 2017-03-07 MED ORDER — INSULIN ASPART 100 UNIT/ML ~~LOC~~ SOLN
0.0000 [IU] | Freq: Every day | SUBCUTANEOUS | Status: DC
  Administered 2017-03-07: 3 [IU] via SUBCUTANEOUS
  Administered 2017-03-08: 2 [IU] via SUBCUTANEOUS
  Administered 2017-03-09: 3 [IU] via SUBCUTANEOUS

## 2017-03-07 MED ORDER — ORAL CARE MOUTH RINSE
15.0000 mL | Freq: Two times a day (BID) | OROMUCOSAL | Status: DC
  Administered 2017-03-08 – 2017-03-12 (×7): 15 mL via OROMUCOSAL

## 2017-03-07 MED ORDER — VITAMIN E 180 MG (400 UNIT) PO CAPS
400.0000 [IU] | ORAL_CAPSULE | Freq: Every day | ORAL | Status: DC
  Administered 2017-03-07 – 2017-03-12 (×6): 400 [IU] via ORAL
  Filled 2017-03-07 (×7): qty 1

## 2017-03-07 MED ORDER — DULOXETINE HCL 30 MG PO CPEP
30.0000 mg | ORAL_CAPSULE | Freq: Every day | ORAL | Status: DC
  Administered 2017-03-07 – 2017-03-13 (×7): 30 mg via ORAL
  Filled 2017-03-07 (×7): qty 1

## 2017-03-07 MED ORDER — PANTOPRAZOLE SODIUM 40 MG PO TBEC
40.0000 mg | DELAYED_RELEASE_TABLET | Freq: Two times a day (BID) | ORAL | Status: DC
  Administered 2017-03-07 – 2017-03-13 (×13): 40 mg via ORAL
  Filled 2017-03-07 (×13): qty 1

## 2017-03-07 MED ORDER — VITAMIN B-6 100 MG PO TABS
100.0000 mg | ORAL_TABLET | Freq: Every day | ORAL | Status: DC
  Administered 2017-03-07 – 2017-03-12 (×6): 100 mg via ORAL
  Filled 2017-03-07 (×7): qty 1

## 2017-03-07 MED ORDER — ALBUTEROL SULFATE (2.5 MG/3ML) 0.083% IN NEBU
2.5000 mg | INHALATION_SOLUTION | Freq: Four times a day (QID) | RESPIRATORY_TRACT | Status: DC | PRN
Start: 2017-03-07 — End: 2017-03-13

## 2017-03-07 MED ORDER — ALPRAZOLAM 0.25 MG PO TABS
0.2500 mg | ORAL_TABLET | Freq: Four times a day (QID) | ORAL | Status: DC | PRN
  Administered 2017-03-09 – 2017-03-13 (×10): 0.25 mg via ORAL
  Filled 2017-03-07 (×10): qty 1

## 2017-03-07 MED ORDER — HYDROXYZINE HCL 25 MG PO TABS: 50.0000 mg | ORAL_TABLET | Freq: Four times a day (QID) | ORAL | Status: DC | PRN

## 2017-03-07 MED ORDER — DOCUSATE SODIUM 100 MG PO CAPS
300.0000 mg | ORAL_CAPSULE | Freq: Every day | ORAL | Status: DC
  Administered 2017-03-07 – 2017-03-12 (×6): 300 mg via ORAL
  Filled 2017-03-07 (×6): qty 3

## 2017-03-07 MED ORDER — HYPROMELLOSE (GONIOSCOPIC) 2.5 % OP SOLN
1.0000 [drp] | OPHTHALMIC | Status: DC | PRN
  Filled 2017-03-07: qty 15

## 2017-03-07 MED ORDER — VITAMIN C 500 MG PO TABS
500.0000 mg | ORAL_TABLET | Freq: Every day | ORAL | Status: DC
  Administered 2017-03-07 – 2017-03-12 (×6): 500 mg via ORAL
  Filled 2017-03-07 (×7): qty 1

## 2017-03-07 MED ORDER — LEVOTHYROXINE SODIUM 75 MCG PO TABS
75.0000 ug | ORAL_TABLET | Freq: Every day | ORAL | Status: DC
  Administered 2017-03-08 – 2017-03-12 (×5): 75 ug via ORAL
  Filled 2017-03-07 (×5): qty 1

## 2017-03-07 MED ORDER — BUSPIRONE HCL 15 MG PO TABS
30.0000 mg | ORAL_TABLET | Freq: Two times a day (BID) | ORAL | Status: DC
  Administered 2017-03-07 – 2017-03-13 (×13): 30 mg via ORAL
  Filled 2017-03-07 (×2): qty 3
  Filled 2017-03-07: qty 2
  Filled 2017-03-07 (×3): qty 3
  Filled 2017-03-07: qty 2
  Filled 2017-03-07 (×6): qty 3

## 2017-03-07 MED ORDER — APIXABAN 5 MG PO TABS
5.0000 mg | ORAL_TABLET | Freq: Two times a day (BID) | ORAL | Status: DC
  Administered 2017-03-07 – 2017-03-11 (×10): 5 mg via ORAL
  Filled 2017-03-07 (×10): qty 1

## 2017-03-07 MED ORDER — ADULT MULTIVITAMIN W/MINERALS CH
1.0000 | ORAL_TABLET | Freq: Every day | ORAL | Status: DC
  Administered 2017-03-07 – 2017-03-12 (×6): 1 via ORAL
  Filled 2017-03-07 (×6): qty 1

## 2017-03-07 MED ORDER — INSULIN ASPART 100 UNIT/ML ~~LOC~~ SOLN
2.0000 [IU] | SUBCUTANEOUS | Status: DC
  Administered 2017-03-07: 4 [IU] via SUBCUTANEOUS

## 2017-03-07 MED ORDER — CALCIUM CARBONATE-VITAMIN D 500-200 MG-UNIT PO TABS
1.0000 | ORAL_TABLET | Freq: Two times a day (BID) | ORAL | Status: DC
  Administered 2017-03-07 – 2017-03-12 (×11): 1 via ORAL
  Filled 2017-03-07 (×11): qty 1

## 2017-03-07 MED ORDER — POLYETHYLENE GLYCOL 3350 17 G PO PACK
17.0000 g | PACK | Freq: Every day | ORAL | Status: DC
  Administered 2017-03-07 – 2017-03-13 (×4): 17 g via ORAL
  Filled 2017-03-07 (×5): qty 1

## 2017-03-07 MED ORDER — POLYVINYL ALCOHOL 1.4 % OP SOLN
1.0000 [drp] | OPHTHALMIC | Status: DC | PRN
  Filled 2017-03-07: qty 15

## 2017-03-07 MED ORDER — AMIODARONE HCL IN DEXTROSE 360-4.14 MG/200ML-% IV SOLN
60.0000 mg/h | INTRAVENOUS | Status: AC
  Administered 2017-03-07: 60 mg/h via INTRAVENOUS
  Filled 2017-03-07: qty 200

## 2017-03-07 MED ORDER — VITAMIN D 1000 UNITS PO TABS
2000.0000 [IU] | ORAL_TABLET | Freq: Every day | ORAL | Status: DC
  Administered 2017-03-07 – 2017-03-12 (×6): 2000 [IU] via ORAL
  Filled 2017-03-07 (×6): qty 2

## 2017-03-07 MED ORDER — ROPINIROLE HCL 1 MG PO TABS
1.0000 mg | ORAL_TABLET | Freq: Every evening | ORAL | Status: DC | PRN
  Administered 2017-03-07 – 2017-03-12 (×8): 1 mg via ORAL
  Filled 2017-03-07 (×7): qty 1

## 2017-03-07 MED ORDER — INSULIN ASPART 100 UNIT/ML ~~LOC~~ SOLN
2.0000 [IU] | Freq: Three times a day (TID) | SUBCUTANEOUS | Status: DC
  Administered 2017-03-07 (×2): 6 [IU] via SUBCUTANEOUS
  Administered 2017-03-08: 4 [IU] via SUBCUTANEOUS

## 2017-03-07 MED ORDER — OMEGA-3-ACID ETHYL ESTERS 1 G PO CAPS
1.0000 g | ORAL_CAPSULE | Freq: Two times a day (BID) | ORAL | Status: DC
  Administered 2017-03-07 – 2017-03-12 (×11): 1 g via ORAL
  Filled 2017-03-07 (×11): qty 1

## 2017-03-07 MED ORDER — VITAMIN D 50 MCG (2000 UT) PO CAPS: 1.0000 | ORAL_CAPSULE | Freq: Every day | ORAL | Status: DC

## 2017-03-07 MED ORDER — TIOTROPIUM BROMIDE MONOHYDRATE 18 MCG IN CAPS
18.0000 ug | ORAL_CAPSULE | Freq: Every day | RESPIRATORY_TRACT | Status: DC
  Administered 2017-03-07 – 2017-03-12 (×5): 18 ug via RESPIRATORY_TRACT
  Filled 2017-03-07 (×2): qty 5

## 2017-03-07 NOTE — Progress Notes (Signed)
RT placed patient on CPAP HS auto 20 max and 5 min. 4L O2 bleed in needed. Patient tolerating well at this time.

## 2017-03-07 NOTE — Progress Notes (Signed)
Advanced Heart Failure Rounding Note  PCP:  Primary Cardiologist: Dr Haroldine Laws  Subjective:    Admitted with A/C Systolic and Acute Hypoxic respirtatory failure in the setting of medication errors at home. K on admit > 7. Placed on short term bipap and weaned to 6 liters of oxygen.   Yesterday diuresed with 200 mg IV lasix. Neg 1.2 liters. CVP 12. K down to 4.1.   Today she is feeling much better. Denies SOB.     Objective:   Weight Range: 200 lb 9.9 oz (91 kg) Body mass index is 33.38 kg/m.   Vital Signs:   Temp:  [95.9 F (35.5 C)-98.3 F (36.8 C)] 98.3 F (36.8 C) (08/03 0405) Pulse Rate:  [82-126] 122 (08/03 0700) Resp:  [17-30] 18 (08/03 0700) BP: (83-135)/(52-88) 99/55 (08/03 0700) SpO2:  [90 %-99 %] 99 % (08/03 0700) FiO2 (%):  [40 %] 40 % (08/03 0000) Weight:  [200 lb 9.9 oz (91 kg)-202 lb (91.6 kg)] 200 lb 9.9 oz (91 kg) (08/03 0444) Last BM Date: 03/06/17 (per pt -- today, PTA)  Weight change: Filed Weights   03/06/17 1129 03/07/17 0444  Weight: 202 lb (91.6 kg) 200 lb 9.9 oz (91 kg)    Intake/Output:   Intake/Output Summary (Last 24 hours) at 03/07/17 0715 Last data filed at 03/07/17 0700  Gross per 24 hour  Intake           443.22 ml  Output             1685 ml  Net         -1241.78 ml      Physical Exam   CVP 12 General:  No resp difficulty. In bed HEENT: Normal Neck: Supple. JVP to jaw  . Carotids 2+ bilat; no bruits. No lymphadenopathy or thyromegaly appreciated. Cor: PMI nondisplaced. Irregular rate & rhythm. No rubs, gallops or murmurs. R upper chest tunneled cath.  Lungs: LLL crackles on 6 liters oxygen Abdomen: obese, soft, nontender, nondistended. No hepatosplenomegaly. No bruits or masses. Good bowel sounds. Extremities: No cyanosis, clubbing, rash, R and LLE edema. L groin with indurated area with pus noted.  Neuro: Alert & orientedx3, cranial nerves grossly intact. moves all 4 extremities w/o difficulty. Affect pleasant GU:  Foley urine yellow.   Telemetry  A fib 110-120s  Personally reviewed   EKG    ED Wide Complex A fib 100   Labs    CBC  Recent Labs  03/06/17 1156  WBC 8.1  HGB 15.1*  HCT 46.9*  MCV 91.1  PLT 465   Basic Metabolic Panel  Recent Labs  03/06/17 2251 03/07/17 0239  NA 130* 134*  K 4.7 4.1  CL 95* 98*  CO2 26 27  GLUCOSE 220* 191*  BUN 53* 52*  CREATININE 2.30* 2.12*  CALCIUM 9.6 9.3  MG  --  2.2   Liver Function Tests No results for input(s): AST, ALT, ALKPHOS, BILITOT, PROT, ALBUMIN in the last 72 hours. No results for input(s): LIPASE, AMYLASE in the last 72 hours. Cardiac Enzymes No results for input(s): CKTOTAL, CKMB, CKMBINDEX, TROPONINI in the last 72 hours.  BNP: BNP (last 3 results)  Recent Labs  02/16/17 2227 02/18/17 1229 03/06/17 1156  BNP 658.4* 668.5* 1,084.8*    ProBNP (last 3 results) No results for input(s): PROBNP in the last 8760 hours.   D-Dimer No results for input(s): DDIMER in the last 72 hours. Hemoglobin A1C No results for input(s): HGBA1C in the last  72 hours. Fasting Lipid Panel No results for input(s): CHOL, HDL, LDLCALC, TRIG, CHOLHDL, LDLDIRECT in the last 72 hours. Thyroid Function Tests No results for input(s): TSH, T4TOTAL, T3FREE, THYROIDAB in the last 72 hours.  Invalid input(s): FREET3  Other results:   Imaging    Dg Chest 2 View  Result Date: 03/06/2017 CLINICAL DATA:  Shortness of Breath EXAM: CHEST  2 VIEW COMPARISON:  02/16/2017 FINDINGS: Right internal jugular PICC line and left AICD remain in place, unchanged. Cardiomegaly. Vascular congestion and diffuse interstitial prominence compatible with interstitial edema. More confluent opacity noted in the infrahilar regions bilaterally, likely edema although infection cannot be excluded. No effusions. No acute bony abnormality. IMPRESSION: Mild CHF. More focal infrahilar opacities bilaterally could reflect edema or infection. Electronically Signed   By:  Rolm Baptise M.D.   On: 03/06/2017 12:16      Medications:     Scheduled Medications: . chlorhexidine  15 mL Mouth Rinse BID  . furosemide  80 mg Intravenous BID  . mouth rinse  15 mL Mouth Rinse q12n4p  . sodium chloride flush  3 mL Intravenous Q12H     Infusions: . sodium chloride    . milrinone 0.5 mcg/kg/min (03/07/17 0700)     PRN Medications:  sodium chloride, acetaminophen, ondansetron (ZOFRAN) IV, sodium chloride flush    Patient Profile   [name] is 61 year old with NICM, chronic systolic heart on  Milrinone 0.5 mcg, DMII, CKD, and h/o bilateral breast cancer admitted with marked volume overload, acute respiratory failure, and hyperkalemia. .   Assessment/Plan   1. A/C Systolic Heart Failure -Admit with class IV symptoms - Given 80 mg IV lasix in the ED. Continue milrinone 0.5 mcg. Todays CO-OX is 67%. Hold spiro and dig. Check dig level now.  Diuresed with IV lasix. Negative 1.2 liters. CVP 12. Continue IV lasix today. No spiro.  Restart dig 0.0625 mg daily. No BB with milrinone.   Considered for possible LVAD but will need see how things go.  2. Acute Respiratory Failure - BIPAP in the ED now weaned to 6 liters. Continue to wean.   3. Hyperkalemia- K 7.4.on admit.  Given insulin, calcium, and kayexylate. Todays Potassium 4.1 4.  AKI on CKD- creatinine elevated 2.4 on admit. Today 2.1 . 5.  Chronic A fib- Failed DC-CV last admit. Start amio Restart apixaban.  6. DMII- check sliding scale. Restart home meds.  7. H/O Bilateral Breast Cancer - Recent PET scan with hypermetabolic lymphadenopathy. Reactive (CHF) vs recurrent malignancy.  Transfer to stepdown. Plans to go to her sisters at discharge. [facility] to resume care once discharged.  Plan for Palliative Care Consult per family request. Not sure she will be a candidate for LVAD. Discussed with Dr Darcey Nora. Formal consult pending.   Length of Stay: 1   Amy Clegg, NP  03/07/2017, 7:15 AM  Advanced Heart  Failure Team Pager [phone] (M-F; [place])  Please contact [facility] for night-coverage after hours (4p -7a ) and weekends on [email or web address]  Improved but still tenuous. Volume status still elevated. AF remains fast. Will add IV amio. Continue IV diuresis and home milrinone. Co-ox stable .   Hyperkalemia improved. Renal function trending down.   Can go to SDU.  Glori Bickers, MD  7:57 AM

## 2017-03-07 NOTE — Progress Notes (Signed)
Inpatient Diabetes Program Recommendations  AACE/ADA: New Consensus Statement on Inpatient Glycemic Control (2015)  Target Ranges:  Prepandial:   less than 140 mg/dL      Peak postprandial:   less than 180 mg/dL (1-2 hours)      Critically ill patients:  140 - 180 mg/dL  Results for LACOLE, KOMOROWSKI (MRN [id number]) as of 03/07/2017 08:09  Ref. Range 03/06/2017 11:56 03/06/2017 15:32 03/06/2017 22:51 03/07/2017 02:39  Glucose Latest Ref Range: 65 - 99 mg/dL 243 (H) 185 (H) 220 (H) 191 (H)   Results for CELSEY, ASSELIN (MRN [id number]) as of 03/07/2017 08:09  Ref. Range 01/28/2017 09:22  Hemoglobin A1C Latest Ref Range: 4.8 - 5.6 % 9.3 (H)    Review of Glycemic Control  Diabetes history: DM2 Outpatient Diabetes medications: Humalog 75/25 20 units BID, Humalog 10-36 units TID with meals Current orders for Inpatient glycemic control: None  Inpatient Diabetes Program Recommendations: Correction (SSI): Please consider ordering CBGS with Novolog correction scale ACHS. HgbA1C: A1C 9.3% on 01/28/17 indicating an average glucose of 220 mg/dl.  Thanks, Barnie Alderman, RN, MSN, CDE Diabetes Coordinator Inpatient Diabetes Program [phone] (Team Pager from 8am to 5pm)

## 2017-03-07 NOTE — Consult Note (Signed)
Consultation Note Date: 03/07/2017   Patient Name: [name]  DOB: [birth year]  MRN: [id number]  Age / Sex: 61 y.o., female  PCP: Lowella Dandy, NP Referring Physician: Jolaine Artist, MD  Reason for Consultation: Establishing goals of care and Psychosocial/spiritual support  HPI/Patient Profile: 61 y.o. 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 She was found to have a potassium level greater than 7..   Clinical Assessment and Goals of Care: Patient is alert and oriented and can speak for herself. I met with patient and her sister, Solon Palm, who is her healthcare power of attorney, to review goals of care. Patient was seen by palliative medicine provider in June 2018 where goals were addressed at that point as well. At that time, plan was for outpatient PET scan to evaluate whether patient had a reoccurrence of her breast cancer in terms of evaluating patient's ability to receive an LVAD. PET scan was performed but was indeterminate for cancer. Patient shares that she is waiting for a biopsy and has a surgical consult appointment scheduled for September with Dr. Dalbert Batman ((Dr. Dalbert Batman has been her surgeon in the past for her breast cancer).  Patient at this point is capable of making her own decisions, but in the absence of being able to share those wishes, her sister, Ladona Horns, is her healthcare power of attorney. Patient does have a daughter, [name], who is also been involved in care planning    SUMMARY OF RECOMMENDATIONS   Continue full CODE STATUS. Patient is hopeful for an LVAD and would undergo short-term intubation, CPR, defibrillation, pressors if these measures were short-term, to achieve goal of LVAD If she is deemed not an  appropriate candidate for an LVAD, patient would want to be a DO NOT RESUSCITATE and go home with hospice She understands that she is not a candidate for an LVAD if her cancer has returned, nor would she want aggressive measures at that point and would elect DO NOT RESUSCITATE status. She wishes to go forward with a biopsy to ascertain if her breast cancer has returned. She has an appointment scheduled with Dr. Darrel Hoover office but not until September. Patient as well as patient's sister would like to see if she can obtain a breast biopsy while she is in the hospital Patient is to be discharged to her sister's home when medically stable with continued home health, for home milrinone. Patient will need hospital bed (order placed to care management). Patient and her sister are also wondering if upon discharge if she can travel 2 hours to recuperate at [facility] with her sister Code Status/Advance Care Planning:  Full code    Symptom Management:   Pain/dyspnea: No shortness of breath at rest. She is denying pain. Would recommend in the setting of renal impairment, low-dose hydromorphone, oxycodone  Anxiety: Continue with as needed Xanax as well as scheduled BuSpar; continue with  Cymbalta daily  Palliative Prophylaxis:   Aspiration, Bowel Regimen, Delirium Protocol, Frequent Pain Assessment, Oral Care and Turn Reposition  Additional Recommendations (Limitations, Scope, Preferences):  Full Scope Treatment  Psycho-social/Spiritual:   Desire for further Chaplaincy support:no  Prognosis:   Unable to determine  Discharge Planning: Home with Home Health      Primary Diagnoses: Present on Admission: . Acute on chronic systolic heart failure, NYHA class 4 ([place])   I have reviewed the medical record, interviewed the patient and family, and examined the patient. The following aspects are pertinent.  Past Medical History:  Diagnosis Date  . AICD (automatic cardioverter/defibrillator)  present    st jude  . Anxiety   . 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 Asthma   . Axillary mass, left 10/17/2016  . Breast cancer ([place]) 2005, 2017  . CHF (congestive heart failure) ([place])   . COPD (chronic obstructive pulmonary disease) ([place])   . Depression    followed by a psychiatrist  . DYSLIPIDEMIA 05/01/2009  . Dyspnea   . Dysrhythmia    atrial fibrillation  . Family history of breast cancer   . Family history of colon cancer   . Family history of uterine cancer   . Furuncle 10/17/2016  . Gastroparesis   . GERD (gastroesophageal reflux disease)   . Headache   . History of breast cancer 2006   with return in 2008, s/p mastectomy, XRT and chemotherapy  . History of external beam radiation therapy 09/25/16-11/13/16   left breast treated to 50.4 Gy in 28 fractions, boost 12 Gy in 6 fractions  . Hypertension   . 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 IBS (irritable bowel syndrome)   . ICD (implantable cardiac defibrillator) in place 11/10/2012   ICD (11/2012)  . Malignant neoplasm of lower-inner quadrant of left female breast ([place]) 06/26/2016  . Myocardial infarction ([place])   . OBESITY 07/24/2007  . OBSTRUCTIVE SLEEP APNEA 07/24/2007   with CPAP compliance  . Osteoarthritis   . Persistent atrial fibrillation ([place]) 09/2014  . Personal history of radiation therapy    current  . Presence of permanent cardiac pacemaker   . RESTLESS LEG SYNDROME 07/24/2007  . SYSTOLIC HEART FAILURE, CHRONIC 01/05/2009   a. chemo induced cardiomyopathy b. cMRI (02/2009) EF 45% c. Myoview 05/2010: EF 59%, no ischemia d. RHC (01/2012) RA 20, RV 55/13/22, PA 56/34 (44), PCWP 28, Fick CO/CI: 3.1 / 1.5, PVR 5.3 WU, PA sat 42% & 48% e. ECHO (10/2012) EF 20-25%, diff HK, MV calcified annulus f. ECHO (03/2014) EF 30-35%, sev HK, inferior/inferoseptal walls, mild MR   Social History   Social History  . Marital status: Divorced    Spouse name: N/A  . Number of children: 1  . Years of education: N/A   Occupational History  .  Disabled Unemployed   Social History Main Topics  . Smoking status: Former Smoker    Packs/day: 1.50    Years: 33.00    Types: Cigarettes    Quit date: 04/12/2001  . Smokeless tobacco: Never Used     Comment: heaviest - 2.5- 3PPD x 3 years   . Alcohol use No  . Drug use: No  . Sexual activity: Not Currently   Other Topics Concern  . None   Social History Narrative   Lives with mother and brother Philis Nettle         Family History  Problem Relation Age of Onset  . Diabetes Mellitus II Mother   . Lung cancer Mother   . Congestive  Heart Failure Mother   . Coronary artery disease Father        CABG x 3  . Hypertension Father   . Diabetes Mellitus II Father   . Breast cancer Cousin 17       maternal first cousin  . Colon cancer Cousin 1       maternal first cousin  . Uterine cancer Cousin        dx in her 42s  . Colon cancer Maternal Aunt        dx in her late 68s  . Uterine cancer Maternal Aunt 66  . Lung cancer Maternal Aunt   . Uterine cancer Maternal Aunt 38  . Colon cancer Maternal Aunt 51  . Breast cancer Maternal Grandmother 16  . Uterine cancer Maternal Aunt 48  . Uterine cancer Cousin        dx in her 21s  . Uterine cancer Cousin 19   Scheduled Meds: . apixaban  5 mg Oral BID  . furosemide  80 mg Intravenous BID  . insulin aspart  2-6 Units Subcutaneous Q4H  . mouth rinse  15 mL Mouth Rinse BID  . sodium chloride flush  3 mL Intravenous Q12H   Continuous Infusions: . sodium chloride    . amiodarone 60 mg/hr (03/07/17 0908)   Followed by  . amiodarone    . milrinone 0.5 mcg/kg/min (03/07/17 0800)   PRN Meds:.sodium chloride, acetaminophen, ondansetron (ZOFRAN) IV, sodium chloride flush Medications Prior to Admission:  Prior to Admission medications   Medication Sig Start Date End Date Taking? Authorizing Provider  acetaminophen (TYLENOL) 650 MG CR tablet Take 1,300 mg by mouth every 8 (eight) hours as needed for pain.   Yes [provider]  albuterol (PROVENTIL HFA) 108 (90 BASE) MCG/ACT inhaler Inhale 2 puffs into the lungs every 6 (six) hours as needed. For shortness of breath   Yes [provider]  ALPRAZolam (XANAX) 0.5 MG tablet Take 0.25 mg by mouth 4 (four) times daily as needed for anxiety.  11/06/15  Yes [provider]  amiodarone (PACERONE) 200 MG tablet Take 1 tablet (200 mg total) by mouth 2 (two) times daily. 02/12/17  Yes Graciella Freer, PA-C  apixaban (ELIQUIS) 5 MG TABS tablet Take 1 tablet (5 mg total) by mouth 2 (two) times daily. 07/11/16  Yes Bensimhon, Bevelyn Buckles, MD  baclofen (LIORESAL) 20 MG tablet Take 20 mg by mouth 2 (two) times daily as needed for muscle spasms.   Yes [provider]  busPIRone (BUSPAR) 30 MG tablet Take 30 mg by mouth 2 (two) times daily.    Yes [provider]  Calcium Carb-Cholecalciferol (CALCIUM 600+D3) 600-800 MG-UNIT TABS Take by mouth daily.   Yes [provider]  Cholecalciferol (VITAMIN D) 2000 units CAPS Take 1 capsule by mouth daily.   Yes [provider]  digoxin (LANOXIN) 0.125 MG tablet Take 1 tablet (0.125 mg total) by mouth every other day. 07/11/16  Yes Bensimhon, Bevelyn Buckles, MD  docusate sodium (COLACE) 100 MG capsule Take 3 capsules (300 mg total) by mouth at bedtime. 10/03/15  Yes Clegg, Amy D, NP  DULoxetine (CYMBALTA) 30 MG capsule Take 30 mg by mouth daily.   Yes Moon, Amy A, NP  Garlic 500 MG CAPS Take 500 mg by mouth daily after lunch.    Yes [provider]  Glucos-Chondroit-Hyaluron-MSM (GLUCOSAMINE CHONDROITIN JOINT PO) Take 1 tablet by mouth daily.   Yes [provider]  Catalina Lunger  100 UNIT/ML KiwkPen Inject 12-36 Units into the skin 3 (three) times daily. On a average per patient reports taking 10 units at breakfast, 10 units lunch and 5 to 10 units at supper 10/30/15  Yes [provider]  HUMALOG MIX 75/25 KWIKPEN (75-25) 100 UNIT/ML Kwikpen Inject 20 Units into the skin 2  (two) times daily.  11/21/15  Yes [provider]  hydrOXYzine (ATARAX/VISTARIL) 50 MG tablet Take 50 mg by mouth every 6 (six) hours as needed for itching ((primarily taken at bedtime)).    Yes [provider]  IRON PO Take 24 mg by mouth daily.   Yes [provider]  levothyroxine (SYNTHROID, LEVOTHROID) 75 MCG tablet Take 75 mcg by mouth daily before breakfast.    Yes [provider]  Melatonin 1 MG TABS Take 0.5 mg by mouth at bedtime.   Yes [provider]  metolazone (ZAROXOLYN) 2.5 MG tablet Take 1 tablet (2.5 mg total) by mouth once a week. Every Friday and as needed. 02/18/17  Yes Clegg, Amy D, NP  milrinone (PRIMACOR) 20 MG/100 ML SOLN infusion Inject 35.2875 mcg/min into the vein continuous. 02/07/17  Yes Shirley Friar, PA-C  mometasone (NASONEX) 50 MCG/ACT nasal spray Place 1 spray into both nostrils daily. allergies   Yes [provider]  Multiple Minerals (CALCIUM-MAGNESIUM-ZINC) TABS Take 1 tablet by mouth daily after lunch.    Yes [provider]  Omega-3 Fatty Acids (FISH OIL) 1000 MG CAPS Take 2 capsules by mouth daily.   Yes [provider]  pantoprazole (PROTONIX) 40 MG tablet Take 40 mg by mouth 2 (two) times daily.     Yes [provider]  polyethylene glycol powder (GLYCOLAX/MIRALAX) powder Mix 17 grams in 8 oz of water twice daily Patient taking differently: daily. Mix 17 grams in 8 oz of water twice daily 06/04/16  Yes Armbruster, Renelda Loma, MD  potassium chloride SA (K-DUR,KLOR-CON) 20 MEQ tablet Take 3 tablets (60 mEq total) by mouth 2 (two) times daily. Patient taking differently: Take 60-80 mEq by mouth See admin instructions. Take 60 mg in the morning and 80 mg in the evening 02/18/17  Yes Clegg, Amy D, NP  Propylene Glycol (SYSTANE BALANCE) 0.6 % SOLN Place 1 drop into both eyes 3 (three) times daily.   Yes [provider]  pyridOXINE (VITAMIN B-6) 100 MG tablet Take 100 mg  by mouth daily after lunch.    Yes [provider]  rOPINIRole (REQUIP) 1 MG tablet Take 1 mg by mouth at bedtime and may repeat dose one time if needed. IF RESTLESS LEGS PERSIST PATIENT MAY REPEAT ADDITIONAL DOSE   Yes [provider]  rosuvastatin (CRESTOR) 10 MG tablet Take 10 mg by mouth at bedtime. 06/18/16  Yes [provider]  Simethicone Extra Strength 125 MG CAPS Take 1 tablet by mouth daily as needed (for bloating). Reported on 02/23/2016   Yes [provider]  spironolactone (ALDACTONE) 25 MG tablet Take 1 tablet (25 mg total) by mouth 2 (two) times daily. 02/07/17  Yes Shirley Friar, PA-C  SUMAtriptan (IMITREX) 100 MG tablet Take 100 mg by mouth daily as needed for migraine.  11/28/15  Yes [provider]  tiotropium (SPIRIVA) 18 MCG inhalation capsule Place 18 mcg into inhaler and inhale daily.     Yes [provider]  torsemide (DEMADEX) 20 MG tablet Take 3 tablets (60 mg total) by mouth 2 (two) times daily. 02/18/17  Yes Clegg, Amy D, NP  traMADol (ULTRAM) 50 MG tablet Take 1 tablet by mouth daily as needed for severe pain.  10/15/16  Yes [provider]  vitamin C (ASCORBIC ACID) 500 MG tablet Take 500 mg by mouth daily after lunch.    Yes [provider]  vitamin E 400 UNIT capsule Take 400 Units by mouth daily after lunch.    Yes [provider]  nitroGLYCERIN (NITROSTAT) 0.4 MG SL tablet Place 1 tablet (0.4 mg total) under the tongue every 5 (five) minutes as needed. For chest pain 07/02/16   Bensimhon, Shaune Pascal, MD   Allergies  Allergen Reactions  . Bee Venom Anaphylaxis  . Aspirin Hives  . Ciprofloxacin Hives  . Codeine Nausea Only    "can take ONLY if she eats with med"  . Naproxen Hives  . Other Other (See Comments)    ANY TYPES OF METAL-CAUSES BLISTERS SKIN AND ITCHING   . Sulfonamide Derivatives Hives  . Xarelto [Rivaroxaban] Hives  . Atorvastatin Itching  . Nsaids Itching and Rash   . Tape Rash    Also allergic to metal  . Tyler Aas Flextouch [Insulin Degludec] Itching and Rash   Review of Systems  Unable to perform ROS: Acuity of condition    Physical Exam  Constitutional: She is oriented to person, place, and time. She appears well-developed and well-nourished.  HENT:  Head: Normocephalic and atraumatic.  Neck: Normal range of motion.  Cardiovascular:  Irrg; tachy  Pulmonary/Chest:  Effort normal at rest  Abdominal: Soft. She exhibits distension.  Genitourinary:  Genitourinary Comments: foley  Neurological: She is alert and oriented to person, place, and time.  Skin: Skin is warm and dry.  Psychiatric: She has a normal mood and affect. Her behavior is normal. Judgment and thought content normal.  Nursing note and vitals reviewed.   Vital Signs: BP (!) 88/50   Pulse (!) 114   Temp (!) 97.2 F (36.2 C) (Axillary)   Resp 19   Ht '5\' 5"'$  (1.651 m)   Wt 91 kg (200 lb 9.9 oz)   SpO2 98%   BMI 33.38 kg/m  Pain Assessment: No/denies pain POSS *See Group Information*: 2-Acceptable,Slightly drowsy, easily aroused Pain Score: 0-No pain   SpO2: SpO2: 98 % O2 Device:SpO2: 98 % O2 Flow Rate: .O2 Flow Rate (L/min): 3 L/min  IO: Intake/output summary:  Intake/Output Summary (Last 24 hours) at 03/07/17 1018 Last data filed at 03/07/17 0900  Gross per 24 hour  Intake           696.92 ml  Output             1935 ml  Net         -1238.08 ml    LBM: Last BM Date: 03/06/17 Baseline Weight: Weight: 91.6 kg (202 lb) Most recent weight: Weight: 91 kg (200 lb 9.9 oz)     Palliative Assessment/Data:   Flowsheet Rows     Most Recent Value  Intake Tab  Referral Department  Cardiology  Unit at Time of Referral  ICU  Palliative Care Primary Diagnosis  Cardiac  Date Notified  03/06/17  Palliative Care Type  Return patient Palliative Care  Reason for referral  Psychosocial or Spiritual support, Clarify Goals of Care  Date of Admission  03/06/17  Date first  seen by Palliative Care  03/07/17  # of days Palliative referral response time  1 Day(s)  # of days IP prior to Palliative referral  0  Clinical Assessment  Palliative Performance Scale Score  50%  Pain Max last 24 hours  0  Pain Min Last 24 hours  0  Dyspnea Max Last 24 Hours  0  Dyspnea Min Last 24 hours  0  Nausea Max Last 24 Hours  0  Nausea Min Last 24 Hours  0  Anxiety Max Last 24 Hours  0  Anxiety Min Last 24 Hours  0  Psychosocial & Spiritual Assessment  Palliative Care Outcomes  Patient/Family meeting held?  Yes  Who was at the meeting?  pt and sister Associate Professor)  Patient/Family wishes: Interventions discontinued/not started   Hemodialysis, Trach, PEG  Palliative Care follow-up planned  Yes, Facility      Time In: 0900 Time Out: 1010 Time Total: 70 min Greater than 50%  of this time was spent counseling and coordinating care related to the above assessment and plan.  Signed by: Dory Horn, NP   Please contact Palliative Medicine Team phone at [phone] for questions and concerns.  For individual provider: See Shea Evans

## 2017-03-07 NOTE — Consult Note (Signed)
   [facility] CM Inpatient Consult   03/07/2017  [name] 1956-06-10 [id number]   Patient is currently active with [facility] for chronic disease management services prior to admission with [facility].  Patient has been engaged by a [employer]. Patient was on home Milrinone.  Our community based plan of care has focused on disease management and community resource support. Will follow up on progress and disposition needs as appropriate. Patient is active with [facility] as well.  Of note, [facility] Care Management services does not replace or interfere with any services that are needed or arranged by inpatient case management or social work.  For additional questions or referrals please contact:  Natividad Brood, RN BSN [facility] Liaison  [phone] business mobile phone Toll free office [phone]

## 2017-03-07 NOTE — Progress Notes (Signed)
[facility]  [name] is an active pt with [facility] team on home Milrinone at time of this readmission.  [facility] team will follow [name] while an inpatient to support transition home when ordered.  If patient discharges after hours, please call [phone].   Larry Sierras 03/07/2017, 9:51 PM

## 2017-03-07 NOTE — Progress Notes (Signed)
RT NOTE:  Pt request break from BIPAP due to mask discomfort. Pt titrated to 6L [place] and tolerating well. Pt understands to call out if she feels any change. Pt resting when RT and RN left room.

## 2017-03-08 ENCOUNTER — Other Ambulatory Visit: Payer: Self-pay

## 2017-03-08 LAB — GLUCOSE, CAPILLARY
GLUCOSE-CAPILLARY: 184 mg/dL — AB (ref 65–99)
Glucose-Capillary: 260 mg/dL — ABNORMAL HIGH (ref 65–99)
Glucose-Capillary: 225 mg/dL — ABNORMAL HIGH (ref 65–99)

## 2017-03-08 LAB — CBC
HCT: 38.6 % (ref 36.0–46.0)
Hemoglobin: 12.8 g/dL (ref 12.0–15.0)
MCH: 29.9 pg (ref 26.0–34.0)
MCHC: 33.2 g/dL (ref 30.0–36.0)
MCV: 90.2 fL (ref 78.0–100.0)
PLATELETS: 251 10*3/uL (ref 150–400)
RBC: 4.28 MIL/uL (ref 3.87–5.11)
RDW: 17.4 % — ABNORMAL HIGH (ref 11.5–15.5)
WBC: 7.3 10*3/uL (ref 4.0–10.5)

## 2017-03-08 LAB — BASIC METABOLIC PANEL
ANION GAP: 11 (ref 5–15)
Anion gap: 10 (ref 5–15)
BUN: 50 mg/dL — ABNORMAL HIGH (ref 6–20)
BUN: 46 mg/dL — AB (ref 6–20)
CALCIUM: 8.7 mg/dL — AB (ref 8.9–10.3)
CHLORIDE: 92 mmol/L — AB (ref 101–111)
CO2: 29 mmol/L (ref 22–32)
CO2: 29 mmol/L (ref 22–32)
CREATININE: 1.66 mg/dL — AB (ref 0.44–1.00)
CREATININE: 1.49 mg/dL — AB (ref 0.44–1.00)
Calcium: 8.8 mg/dL — ABNORMAL LOW (ref 8.9–10.3)
Chloride: 85 mmol/L — ABNORMAL LOW (ref 101–111)
GFR calc non Af Amer: 32 mL/min — ABNORMAL LOW (ref >60)
GFR calc non Af Amer: 37 mL/min — ABNORMAL LOW (ref >60)
GFR, EST AFRICAN AMERICAN: 37 mL/min — AB (ref >60)
GFR, EST AFRICAN AMERICAN: 43 mL/min — AB (ref >60)
GLUCOSE: 177 mg/dL — AB (ref 65–99)
Glucose, Bld: 316 mg/dL — ABNORMAL HIGH (ref 65–99)
POTASSIUM: 3.8 mmol/L (ref 3.5–5.1)
Potassium: 2.9 mmol/L — ABNORMAL LOW (ref 3.5–5.1)
Sodium: 131 mmol/L — ABNORMAL LOW (ref 135–145)
Sodium: 125 mmol/L — ABNORMAL LOW (ref 135–145)

## 2017-03-08 LAB — COOXEMETRY PANEL
CARBOXYHEMOGLOBIN: 1.3 % (ref 0.5–1.5)
Methemoglobin: 0.8 % (ref 0.0–1.5)
O2 SAT: 62.3 %
Total hemoglobin: 13 g/dL (ref 12.0–16.0)

## 2017-03-08 MED ORDER — POTASSIUM CHLORIDE CRYS ER 20 MEQ PO TBCR
40.0000 meq | EXTENDED_RELEASE_TABLET | Freq: Once | ORAL | Status: AC
  Administered 2017-03-08: 40 meq via ORAL
  Filled 2017-03-08: qty 2

## 2017-03-08 MED ORDER — POTASSIUM CHLORIDE CRYS ER 20 MEQ PO TBCR
40.0000 meq | EXTENDED_RELEASE_TABLET | Freq: Two times a day (BID) | ORAL | Status: DC
  Administered 2017-03-09 – 2017-03-13 (×10): 40 meq via ORAL
  Filled 2017-03-08 (×10): qty 2

## 2017-03-08 MED ORDER — METOLAZONE 2.5 MG PO TABS
2.5000 mg | ORAL_TABLET | Freq: Once | ORAL | Status: AC
  Administered 2017-03-08: 2.5 mg via ORAL
  Filled 2017-03-08: qty 1

## 2017-03-08 MED ORDER — INSULIN ASPART 100 UNIT/ML ~~LOC~~ SOLN
0.0000 [IU] | Freq: Three times a day (TID) | SUBCUTANEOUS | Status: DC
  Administered 2017-03-08: 4 [IU] via SUBCUTANEOUS
  Administered 2017-03-08 – 2017-03-09 (×2): 11 [IU] via SUBCUTANEOUS
  Administered 2017-03-09 – 2017-03-10 (×4): 7 [IU] via SUBCUTANEOUS
  Administered 2017-03-10 – 2017-03-11 (×2): 4 [IU] via SUBCUTANEOUS
  Administered 2017-03-11: 7 [IU] via SUBCUTANEOUS
  Administered 2017-03-11: 11 [IU] via SUBCUTANEOUS
  Administered 2017-03-12 (×2): 3 [IU] via SUBCUTANEOUS

## 2017-03-08 MED ORDER — SODIUM CHLORIDE 0.9% FLUSH: 10.0000 mL | INTRAVENOUS | Status: DC | PRN

## 2017-03-08 MED ORDER — CHLORHEXIDINE GLUCONATE CLOTH 2 % EX PADS
6.0000 | MEDICATED_PAD | Freq: Every day | CUTANEOUS | Status: DC
  Administered 2017-03-09 – 2017-03-12 (×4): 6 via TOPICAL

## 2017-03-08 MED ORDER — SODIUM CHLORIDE 0.9% FLUSH
10.0000 mL | Freq: Two times a day (BID) | INTRAVENOUS | Status: DC
  Administered 2017-03-08: 20 mL
  Administered 2017-03-09 – 2017-03-13 (×5): 10 mL

## 2017-03-08 MED ORDER — POTASSIUM CHLORIDE CRYS ER 20 MEQ PO TBCR
40.0000 meq | EXTENDED_RELEASE_TABLET | ORAL | Status: AC
  Administered 2017-03-08: 40 meq via ORAL
  Filled 2017-03-08: qty 2

## 2017-03-08 NOTE — Progress Notes (Addendum)
Patient ID: [name], female   DOB: 1956-06-08, 61 y.o.   MRN: [id number]     Advanced Heart Failure Rounding Note  PCP:  Primary Cardiologist: Dr [name]  Subjective:    Admitted with A/C Systolic and acute hypoxic respiratory failure in the setting of medication errors at home. K on admit > 7. Placed on short term bipap and weaned to 6 liters of oxygen.   She is being diuresed.  Not particularly negative yesterday, weight stable.  CVP 20 this morning with co-ox 1.66.  K low at 2.9. Creatinine coming down, 1.66 today.  Has not had Lasix yet with low K.   Was feeling better but got short of breath again this morning.  She is on Bipap.   Objective:   Weight Range: 200 lb 9.9 oz (91 kg) Body mass index is 33.38 kg/m.   Vital Signs:   Temp:  [96.4 F (35.8 C)-97.9 F (36.6 C)] 97.7 F (36.5 C) (08/04 0729) Pulse Rate:  [103-131] 106 (08/04 1000) Resp:  [16-27] 27 (08/04 1000) BP: (77-119)/(45-82) 119/82 (08/04 1000) SpO2:  [93 %-100 %] 99 % (08/04 1000) Weight:  [200 lb 9.9 oz (91 kg)] 200 lb 9.9 oz (91 kg) (08/04 0800) Last BM Date: 03/03/17  Weight change: Filed Weights   03/06/17 1129 03/07/17 0444 03/08/17 0800  Weight: 202 lb (91.6 kg) 200 lb 9.9 oz (91 kg) 200 lb 9.9 oz (91 kg)    Intake/Output:   Intake/Output Summary (Last 24 hours) at 03/08/17 1054 Last data filed at 03/08/17 1000  Gross per 24 hour  Intake          1523.67 ml  Output             1825 ml  Net          -301.33 ml      Physical Exam   CVP 20 General:  No resp difficulty. In bed. On bipap currently.  HEENT: Normal Neck: Supple. JVP 14-16 cm. Carotids 2+ bilat; no bruits. No lymphadenopathy or thyromegaly appreciated. Cor: PMI nondisplaced. Tachy, regular rate & rhythm. +S3. R upper chest tunneled cath.  Lungs: Crackles dependently Abdomen: obese, soft, nontender, nondistended. No hepatosplenomegaly. No bruits or masses. Good bowel sounds. Extremities: No cyanosis, clubbing,  rash.  1+ ankle edema bilaterally. L groin with indurated area with pus noted.  Neuro: Alert & orientedx3, cranial nerves grossly intact. moves all 4 extremities w/o difficulty. Affect pleasant GU: Foley urine yellow.   Telemetry   Personally reviewed.  Regular rhythm, looks like an atypical flutter.   EKG    Regular rhythm, think may be atypical flutter (doubt NSR).   Labs    CBC  Recent Labs  03/06/17 1156  WBC 8.1  HGB 15.1*  HCT 46.9*  MCV 91.1  PLT 563   Basic Metabolic Panel  Recent Labs  03/07/17 0239 03/08/17 0458  NA 134* 131*  K 4.1 2.9*  CL 98* 92*  CO2 27 29  GLUCOSE 191* 177*  BUN 52* 50*  CREATININE 2.12* 1.66*  CALCIUM 9.3 8.8*  MG 2.2  --    Liver Function Tests No results for input(s): AST, ALT, ALKPHOS, BILITOT, PROT, ALBUMIN in the last 72 hours. No results for input(s): LIPASE, AMYLASE in the last 72 hours. Cardiac Enzymes No results for input(s): CKTOTAL, CKMB, CKMBINDEX, TROPONINI in the last 72 hours.  BNP: BNP (last 3 results)  Recent Labs  02/16/17 2227 02/18/17 1229 03/06/17 1156  BNP 658.4* 668.5*  1,084.8*    ProBNP (last 3 results) No results for input(s): PROBNP in the last 8760 hours.   D-Dimer No results for input(s): DDIMER in the last 72 hours. Hemoglobin A1C No results for input(s): HGBA1C in the last 72 hours. Fasting Lipid Panel No results for input(s): CHOL, HDL, LDLCALC, TRIG, CHOLHDL, LDLDIRECT in the last 72 hours. Thyroid Function Tests No results for input(s): TSH, T4TOTAL, T3FREE, THYROIDAB in the last 72 hours.  Invalid input(s): FREET3  Other results:   Imaging    No results found.   Medications:     Scheduled Medications: . apixaban  5 mg Oral BID  . busPIRone  30 mg Oral BID  . calcium-vitamin D  1 tablet Oral BID  . cholecalciferol  2,000 Units Oral Daily  . digoxin  0.0625 mg Oral Daily  . docusate sodium  300 mg Oral QHS  . DULoxetine  30 mg Oral Daily  . furosemide  80 mg  Intravenous BID  . insulin aspart  0-5 Units Subcutaneous QHS  . insulin aspart  2-6 Units Subcutaneous TID WC  . levothyroxine  75 mcg Oral QAC breakfast  . mouth rinse  15 mL Mouth Rinse BID  . metolazone  2.5 mg Oral Once  . multivitamin with minerals  1 tablet Oral Daily  . omega-3 acid ethyl esters  1 g Oral BID  . pantoprazole  40 mg Oral BID  . polyethylene glycol  17 g Oral Daily  . potassium chloride  40 mEq Oral STAT  . potassium chloride  40 mEq Oral Once  . potassium chloride  40 mEq Oral Once  . [START ON 03/09/2017] potassium chloride  40 mEq Oral BID  . pyridOXINE  100 mg Oral QPC lunch  . rOPINIRole  1 mg Oral QHS,MR X 1  . rosuvastatin  10 mg Oral QHS  . sodium chloride flush  3 mL Intravenous Q12H  . tiotropium  18 mcg Inhalation Daily  . vitamin C  500 mg Oral QPC lunch  . vitamin E  400 Units Oral QPC lunch    Infusions: . sodium chloride    . amiodarone 30 mg/hr (03/08/17 0800)  . milrinone 0.5 mcg/kg/min (03/08/17 0957)    PRN Medications: sodium chloride, acetaminophen, albuterol, ALPRAZolam, hydrOXYzine, ondansetron (ZOFRAN) IV, polyvinyl alcohol, sodium chloride flush    Patient Profile   [name] is 61 year old with NICM, chronic systolic heart on  Milrinone 0.5 mcg, DMII, CKD, and h/o bilateral breast cancer admitted with marked volume overload, acute respiratory failure, and hyperkalemia.    Assessment/Plan   1. Acute on chronic systolic CHF: Nonischemic cardiomyopathy.  Echo with EF 15%, diffuse hypokinesis.  On home milrinone.  She is volume overloaded with CVP 20 today.  Co-ox adequate at 62%.  Admitted with AKI, creatinine now improving.  - Continue home milrinone 0.5.  - Continue Lasix 80 mg IV bid, will give dose of metolazone 2.5.  She will need her K replaced before her am diuretics, will arrange.  - Hold off on spironolactone for now give presentation with hyperkalemia.   - Back on digoxin 0.0625.  - Suspect LVAD will be difficult  here given total picture.  2. Acute Respiratory Failure: Due to CHF.  Back on Bipap overnight, can likely come off to nasal cannula this morning.  3. Hyperkalemia: K 7.4.on admit.  Given insulin, calcium, and kayexalate. Now hypokalemic.  4. AKI on CKD: creatinine elevated 2.4 on admit. Today 1.66. 5. Chronic A fib:  Failed DC-CV last admit. On amiodarone and apixaban.  This morning, actually appears to be in an atypical atrial flutter (regular).  Reviewed ECG, don't think NSR. Rate is in 100s, adequate control.  Do not think she would be likely to maintain NSR on high dose milrinone.   6. DMII: SSI.  7. H/O Bilateral Breast Cancer: Recent PET scan with hypermetabolic lymphadenopathy. Reactive (CHF) vs recurrent malignancy. 8. ?Abscess left groin: Afebrile, pending culture data.   Keep in CCU until volume status better.   Plans to go to her sisters at discharge. [facility] to resume care once discharged.  Plan for Palliative Care Consult per family request. Not sure she will be a candidate for LVAD. Discussed with Dr Darcey Nora. Formal consult pending.   Length of Stay: 2  Loralie Champagne, MD  03/08/2017, 10:54 AM  Advanced Heart Failure Team Pager [phone] (M-F; 7a - 4p)  Please contact [facility] for night-coverage after hours (4p -7a ) and weekends on [email or web address]

## 2017-03-09 DIAGNOSIS — R570 Cardiogenic shock: Secondary | ICD-10-CM

## 2017-03-09 LAB — CBC
HEMATOCRIT: 41.3 % (ref 36.0–46.0)
Hemoglobin: 13.5 g/dL (ref 12.0–15.0)
MCH: 29.3 pg (ref 26.0–34.0)
MCHC: 32.7 g/dL (ref 30.0–36.0)
MCV: 89.8 fL (ref 78.0–100.0)
PLATELETS: 295 10*3/uL (ref 150–400)
RBC: 4.6 MIL/uL (ref 3.87–5.11)
RDW: 17.3 % — ABNORMAL HIGH (ref 11.5–15.5)
WBC: 7.8 10*3/uL (ref 4.0–10.5)

## 2017-03-09 LAB — GLUCOSE, CAPILLARY
GLUCOSE-CAPILLARY: 102 mg/dL — AB (ref 65–99)
GLUCOSE-CAPILLARY: 226 mg/dL — AB (ref 65–99)
GLUCOSE-CAPILLARY: 265 mg/dL — AB (ref 65–99)
Glucose-Capillary: 224 mg/dL — ABNORMAL HIGH (ref 65–99)
Glucose-Capillary: 282 mg/dL — ABNORMAL HIGH (ref 65–99)
Glucose-Capillary: 232 mg/dL — ABNORMAL HIGH (ref 65–99)
Glucose-Capillary: 266 mg/dL — ABNORMAL HIGH (ref 65–99)

## 2017-03-09 LAB — BASIC METABOLIC PANEL
ANION GAP: 11 (ref 5–15)
Anion gap: 8 (ref 5–15)
BUN: 56 mg/dL — ABNORMAL HIGH (ref 6–20)
BUN: 57 mg/dL — AB (ref 6–20)
CHLORIDE: 91 mmol/L — AB (ref 101–111)
CHLORIDE: 88 mmol/L — AB (ref 101–111)
CO2: 29 mmol/L (ref 22–32)
CO2: 28 mmol/L (ref 22–32)
Calcium: 9.1 mg/dL (ref 8.9–10.3)
Calcium: 8.8 mg/dL — ABNORMAL LOW (ref 8.9–10.3)
Creatinine, Ser: 1.78 mg/dL — ABNORMAL HIGH (ref 0.44–1.00)
Creatinine, Ser: 1.63 mg/dL — ABNORMAL HIGH (ref 0.44–1.00)
GFR calc Af Amer: 38 mL/min — ABNORMAL LOW (ref >60)
GFR calc non Af Amer: 30 mL/min — ABNORMAL LOW (ref >60)
GFR, EST AFRICAN AMERICAN: 34 mL/min — AB (ref >60)
GFR, EST NON AFRICAN AMERICAN: 33 mL/min — AB (ref >60)
GLUCOSE: 315 mg/dL — AB (ref 65–99)
Glucose, Bld: 203 mg/dL — ABNORMAL HIGH (ref 65–99)
POTASSIUM: 4.6 mmol/L (ref 3.5–5.1)
POTASSIUM: 3.5 mmol/L (ref 3.5–5.1)
SODIUM: 128 mmol/L — AB (ref 135–145)
Sodium: 127 mmol/L — ABNORMAL LOW (ref 135–145)

## 2017-03-09 LAB — COOXEMETRY PANEL
Carboxyhemoglobin: 1.1 % (ref 0.5–1.5)
Carboxyhemoglobin: 1.6 % — ABNORMAL HIGH (ref 0.5–1.5)
Methemoglobin: 0.9 % (ref 0.0–1.5)
Methemoglobin: 0.7 % (ref 0.0–1.5)
O2 Saturation: 48.3 %
O2 Saturation: 54.6 %
TOTAL HEMOGLOBIN: 13.8 g/dL (ref 12.0–16.0)
TOTAL HEMOGLOBIN: 12 g/dL (ref 12.0–16.0)

## 2017-03-09 MED ORDER — DEXTROSE 5 % IV SOLN: 15.0000 mg/h | INTRAVENOUS | Status: DC

## 2017-03-09 MED ORDER — FUROSEMIDE 10 MG/ML IJ SOLN
80.0000 mg | Freq: Once | INTRAMUSCULAR | Status: AC
Start: 2017-03-09 — End: 2017-03-09

## 2017-03-09 MED ORDER — METOLAZONE 5 MG PO TABS
5.0000 mg | ORAL_TABLET | Freq: Once | ORAL | Status: AC
  Administered 2017-03-09: 5 mg via ORAL
  Filled 2017-03-09: qty 1

## 2017-03-09 MED ORDER — FUROSEMIDE 10 MG/ML IJ SOLN
15.0000 mg/h | INTRAVENOUS | Status: DC
  Administered 2017-03-09 – 2017-03-10 (×2): 15 mg/h via INTRAVENOUS
  Filled 2017-03-09: qty 25
  Filled 2017-03-09: qty 21
  Filled 2017-03-09: qty 25

## 2017-03-09 NOTE — Progress Notes (Signed)
Patient ID: [name], female   DOB: [birth year], 61 y.o.   MRN: [id number]     Advanced Heart Failure Rounding Note  PCP:  Primary Cardiologist: Dr [name]  Subjective:    Admitted with A/C Systolic and acute hypoxic respiratory failure in the setting of medication errors at home. K on admit > 7. Placed on short term bipap and weaned to 6 liters of oxygen.   Yesterday, she was short of breath with elevated CVP but co-ox was adequate at 62%.  I gave her a dose of metolazone along with IV Lasix but she did not diurese particularly well.  CVP 18-19 today.  She feels more short of breath and is very orthopneic. Co-ox only 48% early this morning and creatinine 1.49 => 1.78.     She is now on nasal cannula.   Objective:   Weight Range: 210 lb 9.6 oz (95.5 kg) Body mass index is 35.05 kg/m.   Vital Signs:   Temp:  [96.2 F (35.7 C)-97.7 F (36.5 C)] 97.6 F (36.4 C) (08/05 0344) Pulse Rate:  [77-111] 81 (08/05 0700) Resp:  [12-29] 19 (08/05 0700) BP: (96-134)/(58-83) 121/72 (08/05 0700) SpO2:  [85 %-100 %] 100 % (08/05 0700) Weight:  [210 lb 9.6 oz (95.5 kg)] 210 lb 9.6 oz (95.5 kg) (08/05 0500) Last BM Date: 03/06/17  Weight change: Filed Weights   03/07/17 0444 03/08/17 0800 03/09/17 0500  Weight: 200 lb 9.9 oz (91 kg) 200 lb 9.9 oz (91 kg) 210 lb 9.6 oz (95.5 kg)    Intake/Output:   Intake/Output Summary (Last 24 hours) at 03/09/17 0817 Last data filed at 03/09/17 0700  Gross per 24 hour  Intake           1299.2 ml  Output             1160 ml  Net            139.2 ml      Physical Exam   CVP 18-19 General: No resp difficulty. In bed. On bipap currently.  HEENT: Normal Neck: Supple. JVP 16 cm. Carotids 2+ bilat; no bruits. No lymphadenopathy or thyromegaly appreciated. Cor: PMI nondisplaced. Irregular rate &rhythm. +S3. R upper chest tunneled cath.  Lungs: Crackles dependently Abdomen: obese, soft, nontender, nondistended. No hepatosplenomegaly. No  bruits or masses. Good bowel sounds. Extremities: No cyanosis, clubbing, rash.  1+ ankle edema bilaterally. L groin with indurated area with pus noted.  Neuro: Alert & orientedx3, cranial nerves grossly intact. moves all 4 extremities w/o difficulty. Affect pleasant GU: Foley urine yellow.   Telemetry   Personally reviewed.  Atrial fibrillation in 80s  EKG    8/4: Regular rhythm, think may be atypical flutter (doubt NSR).   Labs    CBC  Recent Labs  03/08/17 1209 03/09/17 0500  WBC 7.3 7.8  HGB 12.8 13.5  HCT 38.6 41.3  MCV 90.2 89.8  PLT 251 409   Basic Metabolic Panel  Recent Labs  03/07/17 0239  03/08/17 1600 03/09/17 0500  NA 134*  < > 125* 128*  K 4.1  < > 3.8 4.6  CL 98*  < > 85* 91*  CO2 27  < > 29 29  GLUCOSE 191*  < > 316* 203*  BUN 52*  < > 46* 56*  CREATININE 2.12*  < > 1.49* 1.78*  CALCIUM 9.3  < > 8.7* 9.1  MG 2.2  --   --   --   < > = values  in this interval not displayed. Liver Function Tests No results for input(s): AST, ALT, ALKPHOS, BILITOT, PROT, ALBUMIN in the last 72 hours. No results for input(s): LIPASE, AMYLASE in the last 72 hours. Cardiac Enzymes No results for input(s): CKTOTAL, CKMB, CKMBINDEX, TROPONINI in the last 72 hours.  BNP: BNP (last 3 results)  Recent Labs  02/16/17 2227 02/18/17 1229 03/06/17 1156  BNP 658.4* 668.5* 1,084.8*    ProBNP (last 3 results) No results for input(s): PROBNP in the last 8760 hours.   D-Dimer No results for input(s): DDIMER in the last 72 hours. Hemoglobin A1C No results for input(s): HGBA1C in the last 72 hours. Fasting Lipid Panel No results for input(s): CHOL, HDL, LDLCALC, TRIG, CHOLHDL, LDLDIRECT in the last 72 hours. Thyroid Function Tests No results for input(s): TSH, T4TOTAL, T3FREE, THYROIDAB in the last 72 hours.  Invalid input(s): FREET3  Other results:   Imaging    No results found.   Medications:     Scheduled Medications: . apixaban  5 mg Oral BID  .  busPIRone  30 mg Oral BID  . calcium-vitamin D  1 tablet Oral BID  . Chlorhexidine Gluconate Cloth  6 each Topical Daily  . cholecalciferol  2,000 Units Oral Daily  . digoxin  0.0625 mg Oral Daily  . docusate sodium  300 mg Oral QHS  . DULoxetine  30 mg Oral Daily  . furosemide  80 mg Intravenous Once  . insulin aspart  0-20 Units Subcutaneous TID WC  . insulin aspart  0-5 Units Subcutaneous QHS  . levothyroxine  75 mcg Oral QAC breakfast  . mouth rinse  15 mL Mouth Rinse BID  . metolazone  5 mg Oral Once  . multivitamin with minerals  1 tablet Oral Daily  . omega-3 acid ethyl esters  1 g Oral BID  . pantoprazole  40 mg Oral BID  . polyethylene glycol  17 g Oral Daily  . potassium chloride  40 mEq Oral BID  . pyridOXINE  100 mg Oral QPC lunch  . rOPINIRole  1 mg Oral QHS,MR X 1  . rosuvastatin  10 mg Oral QHS  . sodium chloride flush  10-40 mL Intracatheter Q12H  . sodium chloride flush  3 mL Intravenous Q12H  . tiotropium  18 mcg Inhalation Daily  . vitamin C  500 mg Oral QPC lunch  . vitamin E  400 Units Oral QPC lunch    Infusions: . sodium chloride    . amiodarone 30 mg/hr (03/09/17 0700)  . furosemide (LASIX) infusion    . furosemide (LASIX) infusion    . milrinone 0.5 mcg/kg/min (03/09/17 0714)    PRN Medications: sodium chloride, acetaminophen, albuterol, ALPRAZolam, hydrOXYzine, ondansetron (ZOFRAN) IV, polyvinyl alcohol, sodium chloride flush, sodium chloride flush    Patient Profile   [name] is 61 year old with NICM, chronic systolic heart on  Milrinone 0.5 mcg, DMII, CKD, and h/o bilateral breast cancer admitted with marked volume overload, acute respiratory failure, and hyperkalemia.    Assessment/Plan   1. Acute on chronic systolic CHF: Nonischemic cardiomyopathy.  Echo with EF 15%, diffuse hypokinesis.  On home milrinone.  She is volume overloaded with CVP 18-19 today.  Co-ox 62% => 48%.  Creatinine a bit worse today.  More short of breath and  orthopneic.  - Continue home milrinone 0.5.  Will resend co-ox this morning now that she is up and awake.  Would probably try to avoid IABP here as uncertain of end-point with temporary  mechanical support.  If co-ox remains low, can add low dose dobutamine to her regimen at least temporarily.  - Lasix 80 mg IV x 1 then start gtt at 15 mg/hr with dose of metolazone 5.   - Hold off on spironolactone for now give presentation with hyperkalemia.   - Back on digoxin 0.0625.  - Suspect LVAD will be difficult here given total picture.  2. Acute Respiratory Failure: Due to CHF.  Now back on nasal cannula.  3. Hyperkalemia: K 7.4.on admit.  Given insulin, calcium, and kayexalate. Hypokalemic yesterday, now K normal. Follow closely.  4. AKI on CKD: creatinine elevated 2.4 on admit. Improved to 1.49 but back up to 1.78.  Cardiorenal.  Follow closely. 5. Chronic A fib: Failed DC-CV last admit. On amiodarone and apixaban.  Yesterday appeared to have atypical atrial flutter, today in atrial fibrillation with rate control.  Do not think she would be likely to maintain NSR on high dose milrinone.   6. DMII: SSI.  7. H/O Bilateral Breast Cancer: Recent PET scan with hypermetabolic lymphadenopathy. Reactive (CHF) vs recurrent malignancy. 8. ?Abscess left groin: Afebrile, pending culture data (re-incubate).   Plans to go to her sisters at discharge. [facility] to resume care once discharged.  Plan for Palliative Care Consult per family request. Not sure she will be a candidate for LVAD. Discussed with Dr Darcey Nora. Formal consult pending.   CRITICAL CARE Performed by: Loralie Champagne  Total critical care time: 35 minutes  Critical care time was exclusive of separately billable procedures and treating other patients.  Critical care was necessary to treat or prevent imminent or life-threatening deterioration.  Critical care was time spent personally by me on the following activities: development of treatment plan with  patient and/or surrogate as well as nursing, discussions with consultants, evaluation of patient's response to treatment, examination of patient, obtaining history from patient or surrogate, ordering and performing treatments and interventions, ordering and review of laboratory studies, ordering and review of radiographic studies, pulse oximetry and re-evaluation of patient's condition.  Length of Stay: 3  Loralie Champagne, MD  03/09/2017, 8:17 AM  Advanced Heart Failure Team Pager [phone] (M-F; 7a - 4p)  Please contact [facility] for night-coverage after hours (4p -7a ) and weekends on [email or web address]

## 2017-03-10 DIAGNOSIS — Z7189 Other specified counseling: Secondary | ICD-10-CM

## 2017-03-10 LAB — CBC
HCT: 39.1 % (ref 36.0–46.0)
Hemoglobin: 12.8 g/dL (ref 12.0–15.0)
MCH: 29.4 pg (ref 26.0–34.0)
MCHC: 32.7 g/dL (ref 30.0–36.0)
MCV: 89.9 fL (ref 78.0–100.0)
Platelets: 273 10*3/uL (ref 150–400)
RBC: 4.35 MIL/uL (ref 3.87–5.11)
RDW: 17.4 % — ABNORMAL HIGH (ref 11.5–15.5)
WBC: 6.4 10*3/uL (ref 4.0–10.5)

## 2017-03-10 LAB — BASIC METABOLIC PANEL
ANION GAP: 11 (ref 5–15)
BUN: 57 mg/dL — ABNORMAL HIGH (ref 6–20)
CALCIUM: 8.7 mg/dL — AB (ref 8.9–10.3)
CHLORIDE: 85 mmol/L — AB (ref 101–111)
CO2: 27 mmol/L (ref 22–32)
CREATININE: 1.59 mg/dL — AB (ref 0.44–1.00)
GFR calc Af Amer: 39 mL/min — ABNORMAL LOW (ref >60)
GFR calc non Af Amer: 34 mL/min — ABNORMAL LOW (ref >60)
GLUCOSE: 383 mg/dL — AB (ref 65–99)
Potassium: 3.4 mmol/L — ABNORMAL LOW (ref 3.5–5.1)
Sodium: 123 mmol/L — ABNORMAL LOW (ref 135–145)

## 2017-03-10 LAB — MAGNESIUM: MAGNESIUM: 1.6 mg/dL — AB (ref 1.7–2.4)

## 2017-03-10 LAB — COOXEMETRY PANEL
CARBOXYHEMOGLOBIN: 1.5 % (ref 0.5–1.5)
Methemoglobin: 0.6 % (ref 0.0–1.5)
O2 SAT: 54.6 %
Total hemoglobin: 14.4 g/dL (ref 12.0–16.0)

## 2017-03-10 LAB — GLUCOSE, CAPILLARY
GLUCOSE-CAPILLARY: 209 mg/dL — AB (ref 65–99)
GLUCOSE-CAPILLARY: 187 mg/dL — AB (ref 65–99)
GLUCOSE-CAPILLARY: 206 mg/dL — AB (ref 65–99)
GLUCOSE-CAPILLARY: 161 mg/dL — AB (ref 65–99)
GLUCOSE-CAPILLARY: 145 mg/dL — AB (ref 65–99)
Glucose-Capillary: 170 mg/dL — ABNORMAL HIGH (ref 65–99)
Glucose-Capillary: 232 mg/dL — ABNORMAL HIGH (ref 65–99)

## 2017-03-10 LAB — AEROBIC/ANAEROBIC CULTURE (SURGICAL/DEEP WOUND)

## 2017-03-10 LAB — AEROBIC/ANAEROBIC CULTURE W GRAM STAIN (SURGICAL/DEEP WOUND)

## 2017-03-10 MED ORDER — INSULIN GLARGINE 100 UNIT/ML ~~LOC~~ SOLN
15.0000 [IU] | Freq: Every day | SUBCUTANEOUS | Status: DC
  Administered 2017-03-10 – 2017-03-12 (×3): 15 [IU] via SUBCUTANEOUS
  Filled 2017-03-10 (×3): qty 0.15

## 2017-03-10 MED ORDER — MAGNESIUM SULFATE 4 GM/100ML IV SOLN
4.0000 g | Freq: Once | INTRAVENOUS | Status: AC
  Administered 2017-03-10: 4 g via INTRAVENOUS
  Filled 2017-03-10: qty 100

## 2017-03-10 MED ORDER — FUROSEMIDE 10 MG/ML IJ SOLN
20.0000 mg/h | INTRAVENOUS | Status: DC
  Administered 2017-03-10 – 2017-03-11 (×3): 20 mg/h via INTRAVENOUS
  Filled 2017-03-10 (×9): qty 25

## 2017-03-10 MED ORDER — METOLAZONE 5 MG PO TABS
5.0000 mg | ORAL_TABLET | Freq: Once | ORAL | Status: AC
  Administered 2017-03-10: 5 mg via ORAL
  Filled 2017-03-10: qty 1

## 2017-03-10 MED ORDER — INSULIN ASPART 100 UNIT/ML ~~LOC~~ SOLN
4.0000 [IU] | Freq: Three times a day (TID) | SUBCUTANEOUS | Status: DC
  Administered 2017-03-11 – 2017-03-12 (×5): 4 [IU] via SUBCUTANEOUS

## 2017-03-10 MED ORDER — POTASSIUM CHLORIDE CRYS ER 20 MEQ PO TBCR
40.0000 meq | EXTENDED_RELEASE_TABLET | Freq: Once | ORAL | Status: AC
  Administered 2017-03-10: 40 meq via ORAL
  Filled 2017-03-10: qty 2

## 2017-03-10 MED ORDER — AMOXICILLIN-POT CLAVULANATE 500-125 MG PO TABS
1.0000 | ORAL_TABLET | Freq: Two times a day (BID) | ORAL | Status: DC
  Administered 2017-03-10 – 2017-03-13 (×7): 500 mg via ORAL
  Filled 2017-03-10 (×8): qty 1

## 2017-03-10 NOTE — Progress Notes (Signed)
Inpatient Diabetes Program Recommendations  AACE/ADA: New Consensus Statement on Inpatient Glycemic Control (2015)  Target Ranges:  Prepandial:   less than 140 mg/dL      Peak postprandial:   less than 180 mg/dL (1-2 hours)      Critically ill patients:  140 - 180 mg/dL   Lab Results  Component Value Date   GLUCAP 206 (H) 03/10/2017   HGBA1C 9.3 (H) 01/28/2017    Review of Glycemic ControlResults for [name], [name] (MRN [id number]) as of 03/10/2017 10:20  Ref. Range 03/09/2017 15:46 03/09/2017 19:49 03/09/2017 21:48 03/09/2017 23:43 03/10/2017 04:05 03/10/2017 07:43  Glucose-Capillary Latest Ref Range: 65 - 99 mg/dL 232 (H) 102 (H) 266 (H) 265 (H) 187 (H) 206 (H)   Diabetes history: DM2 Outpatient Diabetes medications: Humalog 75/25 20 units BID, Humalog 10-36 units TID with meals Current orders for Inpatient glycemic control:   Novolog resistant tid with meals and HS Inpatient Diabetes Program Recommendations: May consider adding Lantus 15 units daily while patient is in the hospital.  Also consider adding Novolog meal coverage 4 units tid with meals while patient is in the hospital.    Thanks, Adah Perl, RN, BC-ADM Inpatient Diabetes Coordinator Pager [phone] (8a-5p)

## 2017-03-10 NOTE — Progress Notes (Addendum)
Patient ID: [name], female   DOB: Feb 14, 1956, 61 y.o.   MRN: [id number]     Advanced Heart Failure Rounding Note  PCP:  Primary Cardiologist: Dr Haroldine Laws  Subjective:    Admitted with A/C Systolic and acute hypoxic respiratory failure in the setting of medication errors at home. K on admit > 7. Placed on short term bipap and weaned to 6 liters of oxygen.   Yesterday lasix drip started at 15 mg per hour.  Remains on milrinone 0.5 mcg and amio 30 mg per hour. Today CO-OX 55%.    Overall feeling better. Denies SOB.    Objective:   Weight Range: 211 lb 3.2 oz (95.8 kg) Body mass index is 35.15 kg/m.   Vital Signs:   Temp:  [96.3 F (35.7 C)-98.3 F (36.8 C)] 97.4 F (36.3 C) (08/06 0738) Pulse Rate:  [79-109] 103 (08/06 0900) Resp:  [15-32] 17 (08/06 0900) BP: (87-130)/(40-82) 97/58 (08/06 0900) SpO2:  [86 %-99 %] 96 % (08/06 0900) Weight:  [211 lb 3.2 oz (95.8 kg)] 211 lb 3.2 oz (95.8 kg) (08/06 0500) Last BM Date: 03/09/17  Weight change: Filed Weights   03/08/17 0800 03/09/17 0500 03/10/17 0500  Weight: 200 lb 9.9 oz (91 kg) 210 lb 9.6 oz (95.5 kg) 211 lb 3.2 oz (95.8 kg)    Intake/Output:   Intake/Output Summary (Last 24 hours) at 03/10/17 0956 Last data filed at 03/10/17 0900  Gross per 24 hour  Intake           2799.6 ml  Output             2730 ml  Net             69.6 ml      Physical Exam  CVP 19-20  General:  NAD. No resp difficulty HEENT: normal Neck: supple. JVP to ear . Carotids 2+ bilat; no bruits. No lymphadenopathy or thryomegaly appreciated. Cor: PMI nondisplaced. Regular rate & rhythm. No rubs,or murmurs. + S3 . R upper chest tunnel PICC Lungs: clear on 2 liters.  Abdomen: soft, nontender, nondistended. No hepatosplenomegaly. No bruits or masses. Good bowel sounds. Extremities: no cyanosis, clubbing, rash, R and LLE 2+ edema. L groin old abscess small opening  Neuro: alert & orientedx3, cranial nerves grossly intact. moves all 4  extremities w/o difficulty. Affect pleasant    Telemetry   Personally reviewed A fib 100s  EKG    8/4: Regular rhythm, think may be atypical flutter (doubt NSR).   Labs    CBC  Recent Labs  03/09/17 0500 03/10/17 0526  WBC 7.8 6.4  HGB 13.5 12.8  HCT 41.3 39.1  MCV 89.8 89.9  PLT 295 109   Basic Metabolic Panel  Recent Labs  03/09/17 1535 03/10/17 0526  NA 127* 123*  K 3.5 3.4*  CL 88* 85*  CO2 28 27  GLUCOSE 315* 383*  BUN 57* 57*  CREATININE 1.63* 1.59*  CALCIUM 8.8* 8.7*   Liver Function Tests No results for input(s): AST, ALT, ALKPHOS, BILITOT, PROT, ALBUMIN in the last 72 hours. No results for input(s): LIPASE, AMYLASE in the last 72 hours. Cardiac Enzymes No results for input(s): CKTOTAL, CKMB, CKMBINDEX, TROPONINI in the last 72 hours.  BNP: BNP (last 3 results)  Recent Labs  02/16/17 2227 02/18/17 1229 03/06/17 1156  BNP 658.4* 668.5* 1,084.8*    ProBNP (last 3 results) No results for input(s): PROBNP in the last 8760 hours.   D-Dimer No results for input(s):  DDIMER in the last 72 hours. Hemoglobin A1C No results for input(s): HGBA1C in the last 72 hours. Fasting Lipid Panel No results for input(s): CHOL, HDL, LDLCALC, TRIG, CHOLHDL, LDLDIRECT in the last 72 hours. Thyroid Function Tests No results for input(s): TSH, T4TOTAL, T3FREE, THYROIDAB in the last 72 hours.  Invalid input(s): FREET3  Other results:   Imaging    No results found.   Medications:     Scheduled Medications: . apixaban  5 mg Oral BID  . busPIRone  30 mg Oral BID  . calcium-vitamin D  1 tablet Oral BID  . Chlorhexidine Gluconate Cloth  6 each Topical Daily  . cholecalciferol  2,000 Units Oral Daily  . digoxin  0.0625 mg Oral Daily  . docusate sodium  300 mg Oral QHS  . DULoxetine  30 mg Oral Daily  . insulin aspart  0-20 Units Subcutaneous TID WC  . insulin aspart  0-5 Units Subcutaneous QHS  . levothyroxine  75 mcg Oral QAC breakfast  . mouth  rinse  15 mL Mouth Rinse BID  . multivitamin with minerals  1 tablet Oral Daily  . omega-3 acid ethyl esters  1 g Oral BID  . pantoprazole  40 mg Oral BID  . polyethylene glycol  17 g Oral Daily  . potassium chloride  40 mEq Oral BID  . pyridOXINE  100 mg Oral QPC lunch  . rOPINIRole  1 mg Oral QHS,MR X 1  . rosuvastatin  10 mg Oral QHS  . sodium chloride flush  10-40 mL Intracatheter Q12H  . sodium chloride flush  3 mL Intravenous Q12H  . tiotropium  18 mcg Inhalation Daily  . vitamin C  500 mg Oral QPC lunch  . vitamin E  400 Units Oral QPC lunch    Infusions: . sodium chloride    . amiodarone 30 mg/hr (03/10/17 0900)  . furosemide (LASIX) infusion 15 mg/hr (03/10/17 0900)  . milrinone 0.5 mcg/kg/min (03/10/17 0900)    PRN Medications: sodium chloride, acetaminophen, albuterol, ALPRAZolam, hydrOXYzine, ondansetron (ZOFRAN) IV, polyvinyl alcohol, sodium chloride flush, sodium chloride flush    Patient Profile   [name] is 60 year old with NICM, chronic systolic heart on  Milrinone 0.5 mcg, DMII, CKD, and h/o bilateral breast cancer admitted with marked volume overload, acute respiratory failure, and hyperkalemia.    Assessment/Plan   1. Acute on chronic systolic CHF: Nonischemic cardiomyopathy.  Echo with EF 15%, diffuse hypokinesis.  On home milrinone.  - Continue home milrinone 0.5. Todays CO-OX 54%.   - Volume status elevated. Increase lasix drip 20 mg per hour + metolazone 5 mgx1. Renal function stable.  - Hold off on spironolactone for now give presentation with hyperkalemia.   - Back on digoxin 0.0625.  - Suspect LVAD will be difficult here given total picture.  2. Acute Respiratory Failure: Due to CHF.  Now back on nasal cannula.  3. Hyperkalemia: K 7.4.on admit.  Given insulin, calcium, and kayexalate.  K remains low 3.4.   4. AKI on CKD: creatinine elevated 2.4 on admit. Todays creatinine 1.59.  Cardiorenal.  Follow closely. 5. Chronic A fib: Failed DC-CV  last admit. Continue amio drip at 30 mg per hour. On apixaban.   6. DMII: SSI.  7. H/O Bilateral Breast Cancer: Recent PET scan with hypermetabolic lymphadenopathy. Reactive (CHF) vs recurrent malignancy. 8. ?Abscess left groin: Afebrile, pending culture data (re-incubate). Multiple species. Discussed ID  Pharm D. Start antibiotics. Add augmentin 500 mg twice a day For  7 days.   Plans to go to her sisters at discharge. [facility] to resume care once discharged.   Palliative Care appreciated. She would like Hospice if VAD not an option.  Plan to discuss today at [facility].   Length of Stay: 4  Amy Clegg, NP  03/10/2017, 9:56 AM  Advanced Heart Failure Team Pager [phone] (M-F; 7a - 4p)  Please contact [facility] for night-coverage after hours (4p -7a ) and weekends on [email or web address]  Patient seen and examined with Darrick Grinder, NP. We discussed all aspects of the encounter. I agree with the assessment and plan as stated above.   She remains extremely tenuous despite high-dose milrinone. CVP high.   I have reviewed PET scan with both Drs. Prescott Gum and [place] and there is concern for recurrent malignancy.   Her case was discussed at length today at [facility] and she is felt not to be a VAD candidate due to RV failure and other comorbidities. We discussed with Palliative Care and we will meet formally with her and her sister in the am to inform them. She has indicated desire to go home with Hospice. Suspect she will have to stop milrinone on d/c as part of this. Ideally would diurese a bit more prior to d/c.  Glori Bickers, MD  8:44 PM

## 2017-03-10 NOTE — Progress Notes (Signed)
Daily Progress Note   Patient Name: [name]       Date: 03/10/2017 DOB: 10/13/1955  Age: 61 y.o. MRN#: [id number] Attending Physician: Jolaine Artist, MD Primary Care Physician: Lowella Dandy, NP Admit Date: 03/06/2017  Reason for Consultation/Follow-up: Establishing goals of care  Subjective: Patient sitting on side of bed, with sister/HCPOA present. She is anxious about her upcoming conversation regarding candidacy for LVAD. She is already aware that the LVAD meeting is this afternoon at 4:00. She and her sister confirm that the patient will come to live with her whether she has LVAD or not.     Length of Stay: 4  Current Medications: Scheduled Meds:  . amoxicillin-clavulanate  1 tablet Oral Q12H  . apixaban  5 mg Oral BID  . busPIRone  30 mg Oral BID  . calcium-vitamin D  1 tablet Oral BID  . Chlorhexidine Gluconate Cloth  6 each Topical Daily  . cholecalciferol  2,000 Units Oral Daily  . digoxin  0.0625 mg Oral Daily  . docusate sodium  300 mg Oral QHS  . DULoxetine  30 mg Oral Daily  . insulin aspart  0-20 Units Subcutaneous TID WC  . insulin aspart  0-5 Units Subcutaneous QHS  . levothyroxine  75 mcg Oral QAC breakfast  . mouth rinse  15 mL Mouth Rinse BID  . multivitamin with minerals  1 tablet Oral Daily  . omega-3 acid ethyl esters  1 g Oral BID  . pantoprazole  40 mg Oral BID  . polyethylene glycol  17 g Oral Daily  . potassium chloride  40 mEq Oral BID  . pyridOXINE  100 mg Oral QPC lunch  . rOPINIRole  1 mg Oral QHS,MR X 1  . rosuvastatin  10 mg Oral QHS  . sodium chloride flush  10-40 mL Intracatheter Q12H  . sodium chloride flush  3 mL Intravenous Q12H  . tiotropium  18 mcg Inhalation Daily  . vitamin C  500 mg Oral QPC lunch  . vitamin E  400 Units  Oral QPC lunch    Continuous Infusions: . sodium chloride    . amiodarone 30 mg/hr (03/10/17 1200)  . furosemide (LASIX) infusion 20 mg/hr (03/10/17 1200)  . milrinone 0.5 mcg/kg/min (03/10/17 1230)    PRN Meds: sodium chloride, acetaminophen, albuterol, ALPRAZolam, hydrOXYzine,  ondansetron (ZOFRAN) IV, polyvinyl alcohol, sodium chloride flush, sodium chloride flush  Physical Exam  Constitutional: She is oriented to person, place, and time. She appears well-developed and well-nourished.  HENT:  Head: Normocephalic and atraumatic.  Eyes: EOM are normal.  Cardiovascular:  Warm and dry  Pulmonary/Chest: Effort normal.  Musculoskeletal: Normal range of motion.  Neurological: She is alert and oriented to person, place, and time.            Vital Signs: BP 110/61 (BP Location: Left Arm)   Pulse (!) 102   Temp 97.6 F (36.4 C) (Oral)   Resp 20   Ht 5\' 5"  (1.651 m)   Wt 95.8 kg (211 lb 3.2 oz)   SpO2 92%   BMI 35.15 kg/m  SpO2: SpO2: 92 % O2 Device: O2 Device: Not Delivered O2 Flow Rate: O2 Flow Rate (L/min): 2 L/min  Intake/output summary:  Intake/Output Summary (Last 24 hours) at 03/10/17 1327 Last data filed at 03/10/17 1220  Gross per 24 hour  Intake           2281.7 ml  Output             3480 ml  Net          -1198.3 ml   LBM: Last BM Date: 03/09/17 Baseline Weight: Weight: 91.6 kg (202 lb) Most recent weight: Weight: 95.8 kg (211 lb 3.2 oz)       Palliative Assessment/Data: 40%    Flowsheet Rows     Most Recent Value  Intake Tab  Referral Department  Cardiology  Unit at Time of Referral  ICU  Palliative Care Primary Diagnosis  Cardiac  Date Notified  03/06/17  Palliative Care Type  Return patient Palliative Care  Reason for referral  Psychosocial or Spiritual support, Clarify Goals of Care  Date of Admission  03/06/17  Date first seen by Palliative Care  03/07/17  # of days Palliative referral response time  1 Day(s)  # of days IP prior to Palliative  referral  0  Clinical Assessment  Palliative Performance Scale Score  50%  Pain Max last 24 hours  0  Pain Min Last 24 hours  0  Dyspnea Max Last 24 Hours  0  Dyspnea Min Last 24 hours  0  Nausea Max Last 24 Hours  0  Nausea Min Last 24 Hours  0  Anxiety Max Last 24 Hours  0  Anxiety Min Last 24 Hours  0  Psychosocial & Spiritual Assessment  Palliative Care Outcomes  Patient/Family meeting held?  Yes  Who was at the meeting?  pt and sister [facility])  Patient/Family wishes: Interventions discontinued/not started   Hemodialysis, Trach, PEG  Palliative Care follow-up planned  Yes, Facility      Patient Active Problem List   Diagnosis Date Noted  . Hyperkalemia   . Palliative care by specialist   . Acute on chronic systolic heart failure, NYHA class 4 ([place]) 03/06/2017  . Lung nodule 02/21/2017  . Shortness of breath   . NICM (nonischemic cardiomyopathy) ([place])   . Chronic systolic (congestive) heart failure ([place])   . Acute on chronic heart failure ([place]) 02/11/2017  . Acute heart failure ([place]) 02/11/2017  . Hypokalemia 02/05/2017  . Atrial flutter ([place]) 02/05/2017  . Goals of care, counseling/discussion   . DNR (do not resuscitate) discussion   . Palliative care encounter   . Acute on chronic systolic (congestive) heart failure ([place]) 01/28/2017  . Acute on chronic systolic heart failure,  NYHA class 3 ([place]) 01/28/2017  . Axillary mass, left 10/17/2016  . Boil 10/17/2016  . Genetic testing 08/15/2016  . Family history of breast cancer   . Family history of uterine cancer   . Family history of colon cancer   . Malignant neoplasm of lower-inner quadrant of left female breast ([place]) 06/26/2016  . Small bowel polyp   . CHF (congestive heart failure) ([place]) 12/21/2015  . Acute blood loss anemia   . Atrial fibrillation ([place]) 12/11/2015  . Benign neoplasm of descending colon   . Upper GI bleeding   . Iron deficiency anemia due to chronic blood loss   . Acute upper GI bleeding  11/14/2015  . Anemia 11/14/2015  . Absolute anemia   . Symptomatic anemia   . Bleeding gastrointestinal   . PAF (paroxysmal atrial fibrillation) ([place]) 09/22/2015  . Chest pain at rest 08/19/2015  . Chest pain 08/19/2015  . NSVT (nonsustained ventricular tachycardia) ([place]) 06/30/2015  . Breast cancer of lower-outer quadrant of right female breast ([place]) 07/22/2014  . CAD (coronary artery disease) 02/17/2014  . Type 1 diabetes mellitus with neurological manifestations ([place]) 01/06/2013  . Cardiogenic shock ([place]) 01/31/2012  . Acute respiratory failure with hypoxia ([place]) 01/31/2012  . PALPITATIONS 09/20/2010  . Mixed hypercholesterolemia and hypertriglyceridemia 05/01/2009  . HYPERTENSION, BENIGN 01/05/2009  . FOOT PAIN 11/29/2008  . Sinus tachycardia 10/25/2008  . 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 Obesity 07/24/2007  . Obstructive sleep apnea 07/24/2007  . RESTLESS LEG SYNDROME 07/24/2007    Palliative Care Assessment & Plan   Patient Profile:  61 y.o. 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 She was found to have a potassium level greater than 7.  Assessment: Patient is nervous about if she will or will not receive LVAD procedure. She wants to focus on hospice and comfort care measures if she is not a candidate for the LVAD. Offered reassurance that we will help support her regardless of the decision. Emotional support provided.   Recommendations/Plan:  Discussion with cardiology. Will return to discuss the outcome and further planning with palliative care.  Goals of Care and Additional Recommendations:  Limitations on Scope of Treatment: Full Scope Treatment  Code Status:    Code Status Orders        Start     Ordered   03/06/17 1511  Full code  Continuous     03/06/17 1510      Code Status History    Date Active Date Inactive Code Status Order ID Comments User Context   02/11/2017  4:59 AM 02/13/2017 12:37 AM Full Code [id number]  Rosanna Randy, MD ED   01/28/2017 10:29 AM 02/07/2017 10:50 PM Full Code [id number]  Jolaine Artist, MD Inpatient   01/28/2017 10:29 AM 01/28/2017 10:29 AM Full Code [id number]  Conrad [place], NP Inpatient   02/23/2016  8:34 PM 02/25/2016  6:56 PM DNR [id number]  Hosie Poisson, MD Inpatient   12/21/2015  4:30 AM 12/23/2015  2:34 PM DNR [id number]  Rise Patience, MD Inpatient   12/11/2015  1:05 PM 12/11/2015  9:01 PM DNR [id number]  Vena Rua, PA-C Inpatient   11/14/2015  5:44 PM 11/18/2015 11:03 PM DNR [id number]  Mercy Riding, MD Inpatient   08/21/2015  5:58 PM 08/22/2015  5:39 PM Full Code [id number]  Bensimhon, Shaune Pascal, MD Inpatient  08/19/2015  8:27 PM 08/21/2015  5:58 PM Full Code [id number]  Flossie Dibble, MD ED   11/10/2012  9:22 AM 11/11/2012  2:27 PM Full Code [id number]  Thompson Grayer, MD Inpatient    Advance Directive Documentation     Most Recent Value  Type of Advance Directive  Living will  Pre-existing out of facility DNR order (yellow form or pink MOST form)  -  "MOST" Form in Place?  -       Prognosis:   Unable to determine  Discharge Planning:  To Be Determined   Thank you for allowing the Palliative Medicine Team to assist in the care of this patient.   Total Time 60min Prolonged Time Billed  no       Greater than 50%  of this time was spent counseling and coordinating care related to the above assessment and plan.  Asencion Gowda, NP   Vinie Sill, NP Palliative Medicine Team Pager # [phone] (M-F 8a-5p) Team Phone # [phone] (Nights/Weekends)  Please contact Palliative Medicine Team phone at [phone] for questions and concerns.

## 2017-03-11 DIAGNOSIS — I509 Heart failure, unspecified: Secondary | ICD-10-CM

## 2017-03-11 LAB — BASIC METABOLIC PANEL
ANION GAP: 9 (ref 5–15)
Anion gap: 14 (ref 5–15)
BUN: 56 mg/dL — AB (ref 6–20)
BUN: 58 mg/dL — ABNORMAL HIGH (ref 6–20)
CALCIUM: 8.5 mg/dL — AB (ref 8.9–10.3)
CHLORIDE: 81 mmol/L — AB (ref 101–111)
CO2: 25 mmol/L (ref 22–32)
CO2: 28 mmol/L (ref 22–32)
CREATININE: 1.63 mg/dL — AB (ref 0.44–1.00)
Calcium: 9.2 mg/dL (ref 8.9–10.3)
Chloride: 85 mmol/L — ABNORMAL LOW (ref 101–111)
Creatinine, Ser: 1.61 mg/dL — ABNORMAL HIGH (ref 0.44–1.00)
GFR calc Af Amer: 38 mL/min — ABNORMAL LOW (ref >60)
GFR calc Af Amer: 39 mL/min — ABNORMAL LOW (ref >60)
GFR calc non Af Amer: 33 mL/min — ABNORMAL LOW (ref >60)
GFR calc non Af Amer: 33 mL/min — ABNORMAL LOW (ref >60)
GLUCOSE: 272 mg/dL — AB (ref 65–99)
Glucose, Bld: 434 mg/dL — ABNORMAL HIGH (ref 65–99)
Potassium: 3.4 mmol/L — ABNORMAL LOW (ref 3.5–5.1)
Potassium: 4.2 mmol/L (ref 3.5–5.1)
SODIUM: 120 mmol/L — AB (ref 135–145)
Sodium: 122 mmol/L — ABNORMAL LOW (ref 135–145)

## 2017-03-11 LAB — CBC
HCT: 40.1 % (ref 36.0–46.0)
Hemoglobin: 12.9 g/dL (ref 12.0–15.0)
MCH: 28.9 pg (ref 26.0–34.0)
MCHC: 32.2 g/dL (ref 30.0–36.0)
MCV: 89.9 fL (ref 78.0–100.0)
PLATELETS: 281 10*3/uL (ref 150–400)
RBC: 4.46 MIL/uL (ref 3.87–5.11)
RDW: 17.2 % — AB (ref 11.5–15.5)
WBC: 5.8 10*3/uL (ref 4.0–10.5)

## 2017-03-11 LAB — GLUCOSE, CAPILLARY
GLUCOSE-CAPILLARY: 198 mg/dL — AB (ref 65–99)
GLUCOSE-CAPILLARY: 197 mg/dL — AB (ref 65–99)
GLUCOSE-CAPILLARY: 171 mg/dL — AB (ref 65–99)
GLUCOSE-CAPILLARY: 225 mg/dL — AB (ref 65–99)
Glucose-Capillary: 285 mg/dL — ABNORMAL HIGH (ref 65–99)
Glucose-Capillary: 195 mg/dL — ABNORMAL HIGH (ref 65–99)

## 2017-03-11 LAB — COOXEMETRY PANEL
CARBOXYHEMOGLOBIN: 1.5 % (ref 0.5–1.5)
Methemoglobin: 0.7 % (ref 0.0–1.5)
O2 SAT: 50.9 %
TOTAL HEMOGLOBIN: 14.4 g/dL (ref 12.0–16.0)

## 2017-03-11 LAB — MAGNESIUM: Magnesium: 2.4 mg/dL (ref 1.7–2.4)

## 2017-03-11 MED ORDER — TOLVAPTAN 15 MG PO TABS
15.0000 mg | ORAL_TABLET | Freq: Once | ORAL | Status: AC
  Administered 2017-03-11: 15 mg via ORAL
  Filled 2017-03-11: qty 1

## 2017-03-11 MED ORDER — AMIODARONE HCL 200 MG PO TABS
200.0000 mg | ORAL_TABLET | Freq: Every day | ORAL | Status: DC
  Administered 2017-03-11: 200 mg via ORAL
  Filled 2017-03-11: qty 1

## 2017-03-11 MED ORDER — POTASSIUM CHLORIDE CRYS ER 20 MEQ PO TBCR
40.0000 meq | EXTENDED_RELEASE_TABLET | Freq: Once | ORAL | Status: AC
  Administered 2017-03-11: 40 meq via ORAL
  Filled 2017-03-11: qty 2

## 2017-03-11 NOTE — Care Management Note (Signed)
Case Management Note  Patient Details  Name: [name] MRN: [id number] Date of Birth: 1956-07-16  Subjective/Objective:   From home, active with [facility] for milrinone drip, presents with acute/chronic resp failiure, hyperkalemia, volume overload, aki, conts on lasix drip and milrinone , ami, co - ox 55. Not a VAD candidat, CVR is high, palliative following , per palliative,it is important for patient to die at [name](POA) and not in the hospital- hopeful for return home Thursday/Friday.  Questioning if Medicare will cont to pay for milrinone if desired, also if [facility] can cont to manage.                   Action/Plan: NCM will follow for dc needs.   Expected Discharge Date:                  Expected Discharge Plan:     In-House Referral:     Discharge planning Services  CM Consult  Post Acute Care Choice:    Choice offered to:     DME Arranged:    DME Agency:     HH Arranged:    HH Agency:     Status of Service:  In process, will continue to follow  If discussed at Long Length of Stay Meetings, dates discussed:    Additional Comments:  Zenon Mayo, RN 03/11/2017, 9:29 PM

## 2017-03-11 NOTE — Progress Notes (Signed)
Daily Progress Note   Patient Name: [name]       Date: 03/11/2017 DOB: January 28, 1956  Age: 61 y.o. MRN#: [id number] Attending Physician: Jolaine Artist, MD Primary Care Physician: Lowella Dandy, NP Admit Date: 03/06/2017  Reason for Consultation/Follow-up: Establishing goals of care  Subjective: Patient is not a candidate for LVAD at this time.  She is resting in bed with CPAP in place sleeping comfortably. Her sister is present at bedside. She intends to proceed home with her sister, and sister would like to take her home Thursday or Friday as she is working on home arrangements. Her sister states she is comfortable with taking her home and providing care. Will speak to patient tomorrow regarding initiation of Hospice and continuation of home Milrinone.    Length of Stay: 5  Current Medications: Scheduled Meds:  . amiodarone  200 mg Oral Daily  . amoxicillin-clavulanate  1 tablet Oral Q12H  . apixaban  5 mg Oral BID  . busPIRone  30 mg Oral BID  . calcium-vitamin D  1 tablet Oral BID  . Chlorhexidine Gluconate Cloth  6 each Topical Daily  . cholecalciferol  2,000 Units Oral Daily  . digoxin  0.0625 mg Oral Daily  . docusate sodium  300 mg Oral QHS  . DULoxetine  30 mg Oral Daily  . insulin aspart  0-20 Units Subcutaneous TID WC  . insulin aspart  0-5 Units Subcutaneous QHS  . insulin aspart  4 Units Subcutaneous TID WC  . insulin glargine  15 Units Subcutaneous QHS  . levothyroxine  75 mcg Oral QAC breakfast  . mouth rinse  15 mL Mouth Rinse BID  . multivitamin with minerals  1 tablet Oral Daily  . omega-3 acid ethyl esters  1 g Oral BID  . pantoprazole  40 mg Oral BID  . polyethylene glycol  17 g Oral Daily  . potassium chloride  40 mEq Oral BID  . pyridOXINE  100 mg  Oral QPC lunch  . rOPINIRole  1 mg Oral QHS,MR X 1  . rosuvastatin  10 mg Oral QHS  . sodium chloride flush  10-40 mL Intracatheter Q12H  . sodium chloride flush  3 mL Intravenous Q12H  . tiotropium  18 mcg Inhalation Daily  . vitamin C  500 mg Oral QPC lunch  .  vitamin E  400 Units Oral QPC lunch    Continuous Infusions: . sodium chloride    . furosemide (LASIX) infusion 20 mg/hr (03/11/17 0800)  . milrinone 0.5 mcg/kg/min (03/11/17 1015)    PRN Meds: sodium chloride, acetaminophen, albuterol, ALPRAZolam, hydrOXYzine, ondansetron (ZOFRAN) IV, polyvinyl alcohol, sodium chloride flush, sodium chloride flush  Physical Exam  Constitutional: She is oriented to person, place, and time. She appears well-developed and well-nourished.  HENT:  Head: Normocephalic and atraumatic.  Eyes: EOM are normal.  Cardiovascular:  Warm and dry  Pulmonary/Chest: Effort normal. No respiratory distress.  CPAP in place.  Musculoskeletal: Normal range of motion.  Neurological: She is alert and oriented to person, place, and time.            Vital Signs: BP 98/66   Pulse 95   Temp 97.8 F (36.6 C) (Oral)   Resp 20   Ht 5\' 5"  (1.651 m)   Wt 96.7 kg (213 lb 1.6 oz)   SpO2 98%   BMI 35.46 kg/m  SpO2: SpO2: 98 % O2 Device: O2 Device: Not Delivered O2 Flow Rate: O2 Flow Rate (L/min): 2 L/min  Intake/output summary:   Intake/Output Summary (Last 24 hours) at 03/11/17 1335 Last data filed at 03/11/17 1027  Gross per 24 hour  Intake           912.32 ml  Output             1520 ml  Net          -607.68 ml   LBM: Last BM Date: 03/10/17 Baseline Weight: Weight: 91.6 kg (202 lb) Most recent weight: Weight: 96.7 kg (213 lb 1.6 oz)       Palliative Assessment/Data: 40%    Flowsheet Rows     Most Recent Value  Intake Tab  Referral Department  Cardiology  Unit at Time of Referral  ICU  Palliative Care Primary Diagnosis  Cardiac  Date Notified  03/06/17  Palliative Care Type  Return  patient Palliative Care  Reason for referral  Psychosocial or Spiritual support, Clarify Goals of Care  Date of Admission  03/06/17  Date first seen by Palliative Care  03/07/17  # of days Palliative referral response time  1 Day(s)  # of days IP prior to Palliative referral  0  Clinical Assessment  Palliative Performance Scale Score  50%  Pain Max last 24 hours  0  Pain Min Last 24 hours  0  Dyspnea Max Last 24 Hours  0  Dyspnea Min Last 24 hours  0  Nausea Max Last 24 Hours  0  Nausea Min Last 24 Hours  0  Anxiety Max Last 24 Hours  0  Anxiety Min Last 24 Hours  0  Psychosocial & Spiritual Assessment  Palliative Care Outcomes  Patient/Family meeting held?  Yes  Who was at the meeting?  pt and sister [facility])  Patient/Family wishes: Interventions discontinued/not started   Hemodialysis, Trach, PEG  Palliative Care follow-up planned  Yes, Facility      Patient Active Problem List   Diagnosis Date Noted  . Hyperkalemia   . Palliative care by specialist   . Acute on chronic systolic heart failure, NYHA class 4 ([place]) 03/06/2017  . Lung nodule 02/21/2017  . Shortness of breath   . NICM (nonischemic cardiomyopathy) ([place])   . Chronic systolic (congestive) heart failure ([place])   . Acute on chronic heart failure ([place]) 02/11/2017  . Acute heart failure ([place]) 02/11/2017  .  Hypokalemia 02/05/2017  . Atrial flutter ([place]) 02/05/2017  . Goals of care, counseling/discussion   . DNR (do not resuscitate) discussion   . Palliative care encounter   . Acute on chronic systolic (congestive) heart failure ([place]) 01/28/2017  . Acute on chronic systolic heart failure, NYHA class 3 ([place]) 01/28/2017  . Axillary mass, left 10/17/2016  . Boil 10/17/2016  . Genetic testing 08/15/2016  . Family history of breast cancer   . Family history of uterine cancer   . Family history of colon cancer   . Malignant neoplasm of lower-inner quadrant of left female breast ([place]) 06/26/2016  . Small bowel polyp     . CHF (congestive heart failure) ([place]) 12/21/2015  . Acute blood loss anemia   . Atrial fibrillation ([place]) 12/11/2015  . Benign neoplasm of descending colon   . Upper GI bleeding   . Iron deficiency anemia due to chronic blood loss   . Acute upper GI bleeding 11/14/2015  . Anemia 11/14/2015  . Absolute anemia   . Symptomatic anemia   . Bleeding gastrointestinal   . PAF (paroxysmal atrial fibrillation) ([place]) 09/22/2015  . Chest pain at rest 08/19/2015  . Chest pain 08/19/2015  . NSVT (nonsustained ventricular tachycardia) ([place]) 06/30/2015  . Breast cancer of lower-outer quadrant of right female breast ([place]) 07/22/2014  . CAD (coronary artery disease) 02/17/2014  . Type 1 diabetes mellitus with neurological manifestations ([place]) 01/06/2013  . Cardiogenic shock ([place]) 01/31/2012  . Acute respiratory failure with hypoxia ([place]) 01/31/2012  . PALPITATIONS 09/20/2010  . Mixed hypercholesterolemia and hypertriglyceridemia 05/01/2009  . HYPERTENSION, BENIGN 01/05/2009  . FOOT PAIN 11/29/2008  . Sinus tachycardia 10/25/2008  . 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 Obesity 07/24/2007  . Obstructive sleep apnea 07/24/2007  . RESTLESS LEG SYNDROME 07/24/2007    Palliative Care Assessment & Plan   Patient Profile:  61 y.o. 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 She was found to have a potassium level greater than 7.  Assessment: Patient is sleeping at this time. Sister is at bedside.   Recommendations/Plan: Will speak with patient and family tomorrow. Depending on conversation, discharge planning for home with Milrinone and home Hospice.    Goals of Care and Additional Recommendations:  Limitations on Scope of Treatment: Full Scope Treatment  Code Status:    Code Status Orders         Start     Ordered   03/06/17 1511  Full code  Continuous     03/06/17 1510    Code Status History    Date Active Date Inactive Code Status Order ID Comments User Context   02/11/2017  4:59 AM 02/13/2017 12:37 AM Full Code [id number]  Rosanna Randy, MD ED   01/28/2017 10:29 AM 02/07/2017 10:50 PM Full Code [id number]  Jolaine Artist, MD Inpatient   01/28/2017 10:29 AM 01/28/2017 10:29 AM Full Code [id number]  Conrad [place], NP Inpatient   02/23/2016  8:34 PM 02/25/2016  6:56 PM DNR [id number]  Hosie Poisson, MD Inpatient   12/21/2015  4:30 AM 12/23/2015  2:34 PM DNR [id number]  Rise Patience, MD Inpatient   12/11/2015  1:05 PM 12/11/2015  9:01 PM DNR [id number]  Vena Rua, PA-C Inpatient   11/14/2015  5:44 PM 11/18/2015 11:03 PM DNR [id number]  Mercy Riding, MD Inpatient   08/21/2015  5:58 PM 08/22/2015  5:39 PM Full Code [id number]  Jolaine Artist, MD Inpatient   08/19/2015  8:27 PM 08/21/2015  5:58 PM Full Code [id number]  Flossie Dibble, MD ED   11/10/2012  9:22 AM 11/11/2012  2:27 PM Full Code [id number]  Thompson Grayer, MD Inpatient    Advance Directive Documentation     Most Recent Value  Type of Advance Directive  Living will  Pre-existing out of facility DNR order (yellow form or pink MOST form)  -  "MOST" Form in Place?  -       Prognosis:   Unable to determine  Discharge Planning:  To Be Determined   Thank you for allowing the Palliative Medicine Team to assist in the care of this patient.   Total Time 22min Prolonged Time Billed  no       Greater than 50%  of this time was spent counseling and coordinating care related to the above assessment and plan.  Asencion Gowda, NP   Vinie Sill, NP Palliative Medicine Team Pager # [phone] (M-F 8a-5p) Team Phone # [phone] (Nights/Weekends)  Please contact Palliative Medicine Team phone at [phone] for questions and concerns.

## 2017-03-11 NOTE — Progress Notes (Signed)
Patient ID: [name], female   DOB: [birth year], 61 y.o.   MRN: [id number]     Advanced Heart Failure Rounding Note  PCP:  Primary Cardiologist: Dr [name]  Subjective:    Admitted with A/C Systolic and acute hypoxic respiratory failure in the setting of medication errors at home. K on admit > 7. Placed on short term bipap and weaned to 6 liters of oxygen.   Yesterday lasix drip was increased to 20 mg. Sluggish urine output. Remains on milrinone 0.5 mcg. + amio 30 mg per hour. Also started on augmentin for L groin culture.   Feeling much better. Denies SOB    Objective:   Weight Range: 213 lb 1.6 oz (96.7 kg) Body mass index is 35.46 kg/m.   Vital Signs:   Temp:  [97.6 F (36.4 C)] 97.6 F (36.4 C) (08/07 0328) Pulse Rate:  [71-102] 96 (08/07 0800) Resp:  [13-26] 13 (08/07 0800) BP: (89-159)/(59-133) 89/67 (08/07 0800) SpO2:  [92 %-99 %] 94 % (08/07 0800) Weight:  [213 lb 1.6 oz (96.7 kg)] 213 lb 1.6 oz (96.7 kg) (08/07 0328) Last BM Date: 03/10/17  Weight change: Filed Weights   03/10/17 0500 03/11/17 0300 03/11/17 0328  Weight: 211 lb 3.2 oz (95.8 kg) 213 lb 1.6 oz (96.7 kg) 213 lb 1.6 oz (96.7 kg)    Intake/Output:   Intake/Output Summary (Last 24 hours) at 03/11/17 0935 Last data filed at 03/11/17 0800  Gross per 24 hour  Intake            947.7 ml  Output             2620 ml  Net          -1672.3 ml      Physical Exam  CVP 16 General:   No resp difficulty. In the chair HEENT: normal Neck: supple. JVP to jaw . Carotids 2+ bilat; no bruits. No lymphadenopathy or thryomegaly appreciated. Cor: PMI nondisplaced. Regular rate & rhythm. 2/6 SEM at LUSB +s3 Lungs: clear Abdomen: soft, nontender, nondistended. No hepatosplenomegaly. No bruits or masses. Good bowel sounds. Extremities: no cyanosis, clubbing, rash, R and LLE 1+ edema. L groin old abscess small opening.  Neuro: alert & orientedx3, cranial nerves grossly intact. moves all 4 extremities w/o  difficulty. Affect pleasant GU: Foley    Telemetry  Personally reviewed 90-100s.   EKG    8/4: Regular rhythm, think may be atypical flutter (doubt NSR).   Labs    CBC  Recent Labs  03/10/17 0526 03/11/17 0404  WBC 6.4 5.8  HGB 12.8 12.9  HCT 39.1 40.1  MCV 89.9 89.9  PLT 273 426   Basic Metabolic Panel  Recent Labs  03/10/17 0526 03/10/17 1040 03/11/17 0404  NA 123*  --  120*  K 3.4*  --  3.4*  CL 85*  --  81*  CO2 27  --  25  GLUCOSE 383*  --  434*  BUN 57*  --  56*  CREATININE 1.59*  --  1.63*  CALCIUM 8.7*  --  8.5*  MG  --  1.6*  --    Liver Function Tests No results for input(s): AST, ALT, ALKPHOS, BILITOT, PROT, ALBUMIN in the last 72 hours. No results for input(s): LIPASE, AMYLASE in the last 72 hours. Cardiac Enzymes No results for input(s): CKTOTAL, CKMB, CKMBINDEX, TROPONINI in the last 72 hours.  BNP: BNP (last 3 results)  Recent Labs  02/16/17 2227 02/18/17 1229 03/06/17 1156  BNP  658.4* 668.5* 1,084.8*    ProBNP (last 3 results) No results for input(s): PROBNP in the last 8760 hours.   D-Dimer No results for input(s): DDIMER in the last 72 hours. Hemoglobin A1C No results for input(s): HGBA1C in the last 72 hours. Fasting Lipid Panel No results for input(s): CHOL, HDL, LDLCALC, TRIG, CHOLHDL, LDLDIRECT in the last 72 hours. Thyroid Function Tests No results for input(s): TSH, T4TOTAL, T3FREE, THYROIDAB in the last 72 hours.  Invalid input(s): FREET3  Other results:   Imaging    No results found.   Medications:     Scheduled Medications: . amoxicillin-clavulanate  1 tablet Oral Q12H  . apixaban  5 mg Oral BID  . busPIRone  30 mg Oral BID  . calcium-vitamin D  1 tablet Oral BID  . Chlorhexidine Gluconate Cloth  6 each Topical Daily  . cholecalciferol  2,000 Units Oral Daily  . digoxin  0.0625 mg Oral Daily  . docusate sodium  300 mg Oral QHS  . DULoxetine  30 mg Oral Daily  . insulin aspart  0-20 Units  Subcutaneous TID WC  . insulin aspart  0-5 Units Subcutaneous QHS  . insulin aspart  4 Units Subcutaneous TID WC  . insulin glargine  15 Units Subcutaneous QHS  . levothyroxine  75 mcg Oral QAC breakfast  . mouth rinse  15 mL Mouth Rinse BID  . multivitamin with minerals  1 tablet Oral Daily  . omega-3 acid ethyl esters  1 g Oral BID  . pantoprazole  40 mg Oral BID  . polyethylene glycol  17 g Oral Daily  . potassium chloride  40 mEq Oral BID  . pyridOXINE  100 mg Oral QPC lunch  . rOPINIRole  1 mg Oral QHS,MR X 1  . rosuvastatin  10 mg Oral QHS  . sodium chloride flush  10-40 mL Intracatheter Q12H  . sodium chloride flush  3 mL Intravenous Q12H  . tiotropium  18 mcg Inhalation Daily  . vitamin C  500 mg Oral QPC lunch  . vitamin E  400 Units Oral QPC lunch    Infusions: . sodium chloride    . amiodarone 30 mg/hr (03/11/17 0800)  . furosemide (LASIX) infusion 20 mg/hr (03/11/17 0800)  . milrinone 0.5 mcg/kg/min (03/11/17 0800)    PRN Medications: sodium chloride, acetaminophen, albuterol, ALPRAZolam, hydrOXYzine, ondansetron (ZOFRAN) IV, polyvinyl alcohol, sodium chloride flush, sodium chloride flush    Patient Profile   [name] is 61 year old with NICM, chronic systolic heart on  Milrinone 0.5 mcg, DMII, CKD, and h/o bilateral breast cancer admitted with marked volume overload, acute respiratory failure, and hyperkalemia.    Assessment/Plan   1. Acute on chronic systolic CHF: Nonischemic cardiomyopathy.  Echo with EF 15%, diffuse hypokinesis.  On home milrinone.  - Continue home milrinone 0.5. Todays CO-OX 51%    - Volume remains elevated. Continue lasix drip 20 mg per hour.  Renal function stable.  Hold off on spironolactone for now give presentation with hyperkalemia.   - Back on digoxin 0.0625.  - Suspect LVAD will be difficult here given total picture.  2. Acute Respiratory Failure: Due to CHF.  Now back on nasal cannula.  3. Hyperkalemia: K 7.4.on admit.  Given  insulin, calcium, and kayexalate.  Resolved.  4. AKI on CKD: creatinine elevated 2.4 on admit. Todays creatinine 1.59.  Cardiorenal.  Follow closely. 5. Chronic A fib: Failed DC-CV last admit. Stop IV amio and start amio 200 mg twice a day.  On apixaban.   6. DMII: SSI.  7. H/O Bilateral Breast Cancer: Recent PET scan with hypermetabolic lymphadenopathy. Reactive (CHF) vs recurrent malignancy. Dr [name] and Dr Darcey Nora concerned about recurrence.  8. ?Abscess left groin: Afebrile, pending culture data (re-incubate). Multiple species. Discussed ID  Pharm D. Start antibiotics. Add augmentin 500 mg twice a day For 7 days.  9. Hyponatremia - sodium 120. Give 15 mg tolvaptan now. Repeat Check BMET at 1400.   She was discussed at [facility] and she was not deemed a candidate for LVAD due to possible recurrent cancer. Plan for family meeting today at 60.    Consult cardiac rehab.   Plans to go to her sisters at discharge. [facility] to resume care once discharged. Palliative Care appreciated. Family meeting today at 1400.   Length of Stay: [place], NP  03/11/2017, 9:35 AM  Advanced Heart Failure Team Pager [phone] (M-F; [place])  Please contact [facility] for night-coverage after hours (4p -7a ) and weekends on [email or web address]  Patient seen and examined with Darrick Grinder, NP. We discussed all aspects of the encounter. I agree with the assessment and plan as stated above.   She remains extremely tenuous despite high-dose milrinone support.  Has been deemed not to be a candidate for LVAD due to possibility of recurrent breast CA and severe RV failure. We discussed this at length. We discussed possible disposition including home with hospice (no milrinone) or a trial of ongoing aggressive support with milrinone. She is meeting with Hospice NP at 2pm with her sister to further discuss. She asked me if we would reconsider VAD in future. I said it was a possibility to reconsider but I felt that it was unlikely  that she would improve to that point.   Will continue milrinone and IV diuresis for now.   Glori Bickers, MD  10:31 AM

## 2017-03-11 NOTE — Discharge Instructions (Signed)

## 2017-03-11 NOTE — Progress Notes (Signed)
Palliative:  Full note to follow. Discussed extensively with Ms. Cadotte's sister and HCPOA Lenore. Lenore has spoken with Ms. Heckert last night and her wishes have been consistent for DNR if not a LVAD candidate and Lenore wishes to put that in place (Ms. Groleau is resting and been very anxious today so we are letting her rest). Very important for her to die at [facility] home and not in the hospital - hopeful for return home Thursday/Friday. Would rec continuing Lasix infusion as indicated and not transition until po until day of discharge to ensure she is able to discharge. They would not return to the hospital.   I have spoken with hospice and they have recommended that we check and see if Medicare will continue paying for milrinone (if this is desired - seems to be per Lenore although may not be very beneficial to her at this point). Also if [facility] can continue to manage. Have requested CMRN to assist with these questions and will discuss further with patient and family.   Vinie Sill, NP Palliative Medicine Team Pager # [phone] (M-F 8a-5p) Team Phone # [phone] (Nights/Weekends)

## 2017-03-11 NOTE — Progress Notes (Signed)
1335 Just getting back to bed. Feeling SOB. Has meeting at 1400. Will continue to follow.Graylon Good RN BSN 03/11/2017 1:36 PM

## 2017-03-12 ENCOUNTER — Telehealth (HOSPITAL_COMMUNITY): Payer: Self-pay

## 2017-03-12 LAB — BASIC METABOLIC PANEL
Anion gap: 11 (ref 5–15)
BUN: 63 mg/dL — AB (ref 6–20)
CALCIUM: 9.3 mg/dL (ref 8.9–10.3)
CO2: 30 mmol/L (ref 22–32)
CREATININE: 1.59 mg/dL — AB (ref 0.44–1.00)
Chloride: 88 mmol/L — ABNORMAL LOW (ref 101–111)
GFR calc Af Amer: 39 mL/min — ABNORMAL LOW (ref >60)
GFR, EST NON AFRICAN AMERICAN: 34 mL/min — AB (ref >60)
Glucose, Bld: 154 mg/dL — ABNORMAL HIGH (ref 65–99)
Potassium: 3.5 mmol/L (ref 3.5–5.1)
SODIUM: 129 mmol/L — AB (ref 135–145)

## 2017-03-12 LAB — GLUCOSE, CAPILLARY
GLUCOSE-CAPILLARY: 136 mg/dL — AB (ref 65–99)
GLUCOSE-CAPILLARY: 89 mg/dL (ref 65–99)
Glucose-Capillary: 147 mg/dL — ABNORMAL HIGH (ref 65–99)
Glucose-Capillary: 109 mg/dL — ABNORMAL HIGH (ref 65–99)
Glucose-Capillary: 155 mg/dL — ABNORMAL HIGH (ref 65–99)

## 2017-03-12 LAB — CBC
HCT: 40.7 % (ref 36.0–46.0)
Hemoglobin: 13.8 g/dL (ref 12.0–15.0)
MCH: 29.6 pg (ref 26.0–34.0)
MCHC: 33.9 g/dL (ref 30.0–36.0)
MCV: 87.3 fL (ref 78.0–100.0)
PLATELETS: 278 10*3/uL (ref 150–400)
RBC: 4.66 MIL/uL (ref 3.87–5.11)
RDW: 16.8 % — AB (ref 11.5–15.5)
WBC: 7.3 10*3/uL (ref 4.0–10.5)

## 2017-03-12 LAB — COOXEMETRY PANEL
Carboxyhemoglobin: 1.1 % (ref 0.5–1.5)
METHEMOGLOBIN: 0.9 % (ref 0.0–1.5)
O2 SAT: 54.6 %
TOTAL HEMOGLOBIN: 14.5 g/dL (ref 12.0–16.0)

## 2017-03-12 MED ORDER — MORPHINE SULFATE (CONCENTRATE) 10 MG/0.5ML PO SOLN: 5.0000 mg | ORAL | Status: DC | PRN

## 2017-03-12 MED ORDER — TORSEMIDE 20 MG PO TABS
60.0000 mg | ORAL_TABLET | Freq: Two times a day (BID) | ORAL | Status: DC
  Administered 2017-03-12 – 2017-03-13 (×3): 60 mg via ORAL
  Filled 2017-03-12 (×3): qty 3

## 2017-03-12 MED ORDER — FUROSEMIDE 10 MG/ML IJ SOLN
120.0000 mg | Freq: Once | INTRAVENOUS | Status: AC
  Administered 2017-03-12: 120 mg via INTRAVENOUS
  Filled 2017-03-12: qty 12

## 2017-03-12 MED ORDER — ALUM & MAG HYDROXIDE-SIMETH 200-200-20 MG/5ML PO SUSP
30.0000 mL | Freq: Four times a day (QID) | ORAL | Status: DC | PRN
  Administered 2017-03-12: 30 mL via ORAL
  Filled 2017-03-12: qty 30

## 2017-03-12 NOTE — Progress Notes (Signed)
Patient transferred to 6N22, patient in bed, placed on tele, CPAP-patient wanted to take a nap, call bell in reach, receiving RN at bedside, no questions from receiving RN.  Rowe Pavy, RN

## 2017-03-12 NOTE — Plan of Care (Signed)
Patient complaining of difficulty breathing, diaphoretic, lethargic, slurred speech and slight AMS.  Vitals taken (BP 93/54, resp 8 and temp did not register. SPO2 stable, but placed on 2L [place]. BS 89. Crackles throughout noted in lungs.  Rapid called to gain assistance. Patient placed on CPAP and also assisted back to bed.  Dr. And family notified to inform.  D/T patient's current wishes - no further orders at this time. Family commuting to the hospital. Plan is to probably DC 8/9 w/ hospice care.

## 2017-03-12 NOTE — Discharge Summary (Signed)
Advanced Heart Failure Team  Discharge Summary   Patient ID: [name] MRN: [id number], DOB/AGE: 62-Jun-1957 62 y.o. Admit date: 03/06/2017 D/C date:     03/13/2017   Primary Discharge Diagnoses:  1. End Stage Acute/Chronic Systolic Heart Failure 2. Acute Respiratory Failure 3 Hyperkalemia 4. AKI on CKD 5. Chronic A fib 6. DMII 7. H/O Bilateral Breast Cancer 8. L groin Abscess 9. Hyponatremia 10. DNR[place]irtha Rm DNR form completed.     Hospital Course:  Ms Crisp is 61 year old with NICM, chronic systolic heart on Milrinone 0.5 mcg, DMII, CKD, and h/o bilateral breast cancer admitted with marked volume overload, acute respiratory failure, and hyperkalemia.   Admitted with marked volume overload in the setting of home medications errors. On admit potassium was 7.4, she was acutely short of breath, and volume overloaded. In the ED she was placed on Bipap, diuresed with IV lasix, and given insuline, calcium, and kayexalate. Admitted to ICU continued on milrinone 0.5 mcg and diuresed. Respiratory status improved and Bipap was stopped. Volume status improved with lasix drip and she transitioned to torsemide 60 mg twice a day.    She was being considered for LVAD however recent PET scan was concerning for recurrent cancer. PET scan was reviewed and discussed by Dr Haroldine Laws, Dr Lindi Adie, and Dr Darcey Nora she was not a candidate for VAD due to suspected recurrent cancer.  Palliative Care consulted for goals of care and at the conclusion of meeting she elected to transition to Hospice at home with her sister. She also requested to stop milrinone and continue foley for comfort. She and her sister realize that she may quickly decompensate but she wants to discharge home with [place]. We will leave tunneled PICC in the event she needs IV morphine. Gold DNR form completed for D/C.   1. End StageAcute on chronic systolic CHF: Nonischemic cardiomyopathy.  Echo with EF 15%, diffuse  hypokinesis.  On home milrinone 0.5 mcg when she was admitted.   After discussion with Palliative Care discussion she would like to stop milrinone and move to Hospice. Milrinone stopped. Plan to continue tunneled PICC in the event she needs IV morphine.  Volume status improved. Stop IV lasix and transition to torsemide 60 mg twice a day.  Stop all nonessential medications.   2. Acute Respiratory Failure: Due to CHF.  Now back on nasal cannula.  3. Hyperkalemia: K 7.4.on admit.  Given insulin, calcium, and kayexalate. Resolved.  4. AKI on CKD: creatinine elevated 2.4 on admit. Todays creatinine 1.59.   5. Chronic A fib: Failed DC-CV last admit. Stop amio and apixaban with transition to hospice.  6. DMII: SSI.  7. H/O Bilateral Breast Cancer: Recent PET scan with hypermetabolic lymphadenopathy. Reactive (CHF) vs recurrent malignancy. Dr Haroldine Laws and Dr Darcey Nora concerned about recurrence and discussed PET scan with Dr Lindi Adie.   8. ?Abscess left groin: Afebrile, pending culture data (re-incubate). Multiple species. Discussed ID  Pharm D. Completed a few days of augmentin. She does not want to continue.   9. Hyponatremia - sodium was down to 120 but went up to 129  Improved after tolvaptan.   10. DNR  Discharge Weight: Discharge Vitals: Blood pressure 93/76, pulse 95, temperature 97.9 F (36.6 C), temperature source Oral, resp. rate 16, height 5\' 5"  (1.651 m), weight 213 lb 8 oz (96.8 kg), SpO2 100 %.  Labs: Lab Results  Component Value Date   WBC 7.3 03/12/2017   HGB 13.8 03/12/2017   HCT 40.7  03/12/2017   MCV 87.3 03/12/2017   PLT 278 03/12/2017     Recent Labs Lab 03/12/17 0500  NA 129*  K 3.5  CL 88*  CO2 30  BUN 63*  CREATININE 1.59*  CALCIUM 9.3  GLUCOSE 154*   Lab Results  Component Value Date   CHOL 83 01/28/2017   HDL 25 (L) 01/28/2017   LDLCALC 46 01/28/2017   TRIG 58 01/28/2017   BNP (last 3 results)  Recent Labs  02/16/17 2227 02/18/17 1229 03/06/17 1156   BNP 658.4* 668.5* 1,084.8*    ProBNP (last 3 results) No results for input(s): PROBNP in the last 8760 hours.    General:  Appears fatigued. Vomiting. In bed.  HEENT: normal Neck: supple. JVP ~10 . Carotids 2+ bilat; no bruits. No lymphadenopathy or thryomegaly appreciated. Cor: PMI nondisplaced. Irregular rate & rhythm. 2/6 SEM at LUSB +s3 R upper chest tunneled catheter.  Lungs: clear Abdomen: soft, nontender, nondistended. No hepatosplenomegaly. No bruits or masses. Good bowel sounds. Extremities: no cyanosis, clubbing, rash, R and LLE 1+ edema. L groin old abscess small opening.  Neuro: alert & orientedx3, cranial nerves grossly intact. moves all 4 extremities w/o difficulty. Affect pleasant GU: Foley  Diagnostic Studies/Procedures   No results found.  Discharge Medications   Allergies as of 03/13/2017      Reactions   Bee Venom Anaphylaxis   Aspirin Hives   Ciprofloxacin Hives   Codeine Nausea Only   "can take ONLY if she eats with med"   Naproxen Hives   Other Other (See Comments)   ANY TYPES OF METAL-CAUSES BLISTERS SKIN AND ITCHING   Sulfonamide Derivatives Hives   Xarelto [rivaroxaban] Hives   Atorvastatin Itching   Nsaids Itching, Rash   Tape Rash   Also allergic to metal   Tresiba Flextouch [insulin Degludec] Itching, Rash      Medication List    STOP taking these medications   amiodarone 200 MG tablet Commonly known as:  PACERONE   apixaban 5 MG Tabs tablet Commonly known as:  ELIQUIS   CALCIUM 600+D3 600-800 MG-UNIT Tabs Generic drug:  Calcium Carb-Cholecalciferol   Calcium-Magnesium-Zinc Tabs   digoxin 0.125 MG tablet Commonly known as:  LANOXIN   Garlic 062 MG Caps   GLUCOSAMINE CHONDROITIN JOINT PO   levothyroxine 75 MCG tablet Commonly known as:  SYNTHROID, LEVOTHROID   metolazone 2.5 MG tablet Commonly known as:  ZAROXOLYN   milrinone 20 MG/100 ML Soln infusion Commonly known as:  PRIMACOR   pyridOXINE 100 MG tablet Commonly  known as:  VITAMIN B-6   rosuvastatin 10 MG tablet Commonly known as:  CRESTOR   Simethicone Extra Strength 125 MG Caps   spironolactone 25 MG tablet Commonly known as:  ALDACTONE   traMADol 50 MG tablet Commonly known as:  ULTRAM   vitamin C 500 MG tablet Commonly known as:  ASCORBIC ACID   Vitamin D 2000 units Caps   vitamin E 400 UNIT capsule     TAKE these medications   acetaminophen 650 MG CR tablet Commonly known as:  TYLENOL Take 1,300 mg by mouth every 8 (eight) hours as needed for pain.   ALPRAZolam 0.5 MG tablet Commonly known as:  XANAX Take 0.25 mg by mouth 4 (four) times daily as needed for anxiety.   baclofen 20 MG tablet Commonly known as:  LIORESAL Take 20 mg by mouth 2 (two) times daily as needed for muscle spasms.   busPIRone 30 MG tablet Commonly known as:  BUSPAR Take 30 mg by mouth 2 (two) times daily.   docusate sodium 100 MG capsule Commonly known as:  COLACE Take 3 capsules (300 mg total) by mouth at bedtime.   DULoxetine 30 MG capsule Commonly known as:  CYMBALTA Take 30 mg by mouth daily.   Fish Oil 1000 MG Caps Take 2 capsules by mouth daily.   HUMALOG KWIKPEN 100 UNIT/ML KiwkPen Generic drug:  insulin lispro Inject 12-36 Units into the skin 3 (three) times daily. On a average per patient reports taking 10 units at breakfast, 10 units lunch and 5 to 10 units at supper   HUMALOG MIX 75/25 KWIKPEN (75-25) 100 UNIT/ML Kwikpen Generic drug:  Insulin Lispro Prot & Lispro Inject 20 Units into the skin 2 (two) times daily.   hydrOXYzine 50 MG tablet Commonly known as:  ATARAX/VISTARIL Take 50 mg by mouth every 6 (six) hours as needed for itching ((primarily taken at bedtime)).   IRON PO Take 24 mg by mouth daily.   Melatonin 1 MG Tabs Take 0.5 mg by mouth at bedtime.   mometasone 50 MCG/ACT nasal spray Commonly known as:  NASONEX Place 1 spray into both nostrils daily. allergies   morphine CONCENTRATE 10 MG/0.5ML Soln  concentrated solution Take 0.25-1 mLs (5-20 mg total) by mouth every hour as needed for severe pain or shortness of breath.   nitroGLYCERIN 0.4 MG SL tablet Commonly known as:  NITROSTAT Place 1 tablet (0.4 mg total) under the tongue every 5 (five) minutes as needed. For chest pain   ondansetron 4 MG tablet Commonly known as:  ZOFRAN Take 1 tablet (4 mg total) by mouth every 8 (eight) hours as needed for nausea or vomiting.   pantoprazole 40 MG tablet Commonly known as:  PROTONIX Take 40 mg by mouth 2 (two) times daily.   polyethylene glycol powder powder Commonly known as:  GLYCOLAX/MIRALAX Mix 17 grams in 8 oz of water twice daily What changed:  when to take this  additional instructions   potassium chloride SA 20 MEQ tablet Commonly known as:  K-DUR,KLOR-CON Take 2 tablets (40 mEq total) by mouth 2 (two) times daily. What changed:  how much to take   PROVENTIL HFA 108 (90 Base) MCG/ACT inhaler Generic drug:  albuterol Inhale 2 puffs into the lungs every 6 (six) hours as needed. For shortness of breath   rOPINIRole 1 MG tablet Commonly known as:  REQUIP Take 1 mg by mouth at bedtime and may repeat dose one time if needed. IF RESTLESS LEGS PERSIST PATIENT MAY REPEAT ADDITIONAL DOSE   SUMAtriptan 100 MG tablet Commonly known as:  IMITREX Take 100 mg by mouth daily as needed for migraine.   SYSTANE BALANCE 0.6 % Soln Generic drug:  Propylene Glycol Place 1 drop into both eyes 3 (three) times daily.   tiotropium 18 MCG inhalation capsule Commonly known as:  SPIRIVA Place 18 mcg into inhaler and inhale daily.   torsemide 20 MG tablet Commonly known as:  DEMADEX Take 3 tablets (60 mg total) by mouth 2 (two) times daily.       Disposition   The patient will be discharged in stable condition to home with [facility] to follow.  Discharge Instructions    Diet - low sodium heart healthy    Complete by:  As directed    Increase activity slowly     Complete by:  As directed      Follow-up Information    Lissett Favorite, Shaune Pascal, MD Follow  up on 03/27/2017.   Specialty:  Cardiology Why:  at 11:00Garage Code [id number]  Contact information: [place] [address] [phone]             Duration of Discharge Encounter: Greater than 35 minutes   Signed, Darrick Grinder NP-C  03/13/2017, 9:14 AM  Patient seen and examined with Darrick Grinder, NP. We discussed all aspects of the encounter. I agree with the assessment and plan as stated above.   She has deteriorated overnight. Remains very tenuous. Discussed with her and her sister - she is adamant about going home today with Hospice. Discussed with Hospice team and we will plan for discharge home this am with their support.   Glori Bickers, MD  11:45 AM

## 2017-03-12 NOTE — Progress Notes (Signed)
Patient placed on CPAP by Rapid Response Nurse. Patient tolerating well at this time with 2L bled in, sats 99%. RT will continue to monitor.

## 2017-03-12 NOTE — Progress Notes (Signed)
Daily Progress Note   Patient Name: [name]       Date: 03/12/2017 DOB: 1956-05-12  Age: 61 y.o. MRN#: [id number] Attending Physician: Jolaine Artist, MD Primary Care Physician: Lowella Dandy, NP Admit Date: 03/06/2017  Reason for Consultation/Follow-up: Establishing goals of care  Subjective: Patient is sitting in bedside chair; her sister is at bedside. She appears to be very happy and at peace with the decision to go home as soon as possible stating several times that she is comfortable to " let nature to take its course". She has decided that she does not want to go home with her milrinone however she does want to go home with a Foley catheter at this time, and oral diuretics. She has verbalized that she does not want return trips to the hospital She is comfortable to discharge with the PICC line with the understanding that it is something that hospice will help to manage, in the case that at end of life she needs IV medications. Dr. Haroldine Laws at bedside this morning who discussed with the patient and sister her plans, and confirms medication changes today to reflect the plans for discharge home tomorrow.. The patient would like to discharge home with hospice and she is aware that we will consult them and to obtain needed DME equipment.    Length of Stay: 6  Current Medications: Scheduled Meds:  . amoxicillin-clavulanate  1 tablet Oral Q12H  . busPIRone  30 mg Oral BID  . calcium-vitamin D  1 tablet Oral BID  . Chlorhexidine Gluconate Cloth  6 each Topical Daily  . cholecalciferol  2,000 Units Oral Daily  . docusate sodium  300 mg Oral QHS  . DULoxetine  30 mg Oral Daily  . insulin aspart  0-20 Units Subcutaneous TID WC  . insulin aspart  0-5 Units Subcutaneous QHS  . insulin  aspart  4 Units Subcutaneous TID WC  . insulin glargine  15 Units Subcutaneous QHS  . multivitamin with minerals  1 tablet Oral Daily  . omega-3 acid ethyl esters  1 g Oral BID  . pantoprazole  40 mg Oral BID  . polyethylene glycol  17 g Oral Daily  . potassium chloride  40 mEq Oral BID  . pyridOXINE  100 mg Oral QPC lunch  . rOPINIRole  1  mg Oral QHS,MR X 1  . sodium chloride flush  10-40 mL Intracatheter Q12H  . sodium chloride flush  3 mL Intravenous Q12H  . tiotropium  18 mcg Inhalation Daily  . torsemide  60 mg Oral BID  . vitamin C  500 mg Oral QPC lunch  . vitamin E  400 Units Oral QPC lunch    Continuous Infusions: . sodium chloride      PRN Meds: sodium chloride, acetaminophen, albuterol, ALPRAZolam, alum & mag hydroxide-simeth, hydrOXYzine, ondansetron (ZOFRAN) IV, polyvinyl alcohol, sodium chloride flush, sodium chloride flush  Physical Exam  Constitutional: She is oriented to person, place, and time. She appears well-developed and well-nourished.  HENT:  Head: Normocephalic and atraumatic.  Eyes: EOM are normal.  Cardiovascular:  Warm and dry  Pulmonary/Chest: Effort normal. No respiratory distress.  CPAP in place.  Musculoskeletal: Normal range of motion.  Neurological: She is alert and oriented to person, place, and time.  Psychiatric: Thought content normal.            Vital Signs: BP 96/71   Pulse 97   Temp (!) 97.2 F (36.2 C) (Oral)   Resp 20   Ht 5\' 5"  (1.651 m)   Wt 96.8 kg (213 lb 8 oz)   SpO2 95%   BMI 35.53 kg/m  SpO2: SpO2: 95 % O2 Device: O2 Device: Nasal Cannula O2 Flow Rate: O2 Flow Rate (L/min): 2 L/min  Intake/output summary:   Intake/Output Summary (Last 24 hours) at 03/12/17 0935 Last data filed at 03/12/17 0800  Gross per 24 hour  Intake          1269.72 ml  Output             2025 ml  Net          -755.28 ml   LBM: Last BM Date: 03/12/17 Baseline Weight: Weight: 91.6 kg (202 lb) Most recent weight: Weight: 96.8 kg (213 lb  8 oz)       Palliative Assessment/Data: 40%    Flowsheet Rows     Most Recent Value  Intake Tab  Referral Department  Cardiology  Unit at Time of Referral  ICU  Palliative Care Primary Diagnosis  Cardiac  Date Notified  03/06/17  Palliative Care Type  Return patient Palliative Care  Reason for referral  Psychosocial or Spiritual support, Clarify Goals of Care  Date of Admission  03/06/17  Date first seen by Palliative Care  03/07/17  # of days Palliative referral response time  1 Day(s)  # of days IP prior to Palliative referral  0  Clinical Assessment  Palliative Performance Scale Score  50%  Pain Max last 24 hours  0  Pain Min Last 24 hours  0  Dyspnea Max Last 24 Hours  0  Dyspnea Min Last 24 hours  0  Nausea Max Last 24 Hours  0  Nausea Min Last 24 Hours  0  Anxiety Max Last 24 Hours  0  Anxiety Min Last 24 Hours  0  Psychosocial & Spiritual Assessment  Palliative Care Outcomes  Patient/Family meeting held?  Yes  Who was at the meeting?  pt and sister [facility])  Patient/Family wishes: Interventions discontinued/not started   Hemodialysis, Trach, PEG  Palliative Care follow-up planned  Yes, Facility      Patient Active Problem List   Diagnosis Date Noted  . Hyperkalemia   . Palliative care by specialist   . Acute on chronic systolic heart failure, NYHA class 4 ([place])  03/06/2017  . Lung nodule 02/21/2017  . Shortness of breath   . NICM (nonischemic cardiomyopathy) ([place])   . Chronic systolic (congestive) heart failure ([place])   . Acute on chronic heart failure ([place]) 02/11/2017  . Acute heart failure ([place]) 02/11/2017  . Hypokalemia 02/05/2017  . Atrial flutter ([place]) 02/05/2017  . Goals of care, counseling/discussion   . DNR (do not resuscitate) discussion   . Palliative care encounter   . Acute on chronic systolic (congestive) heart failure ([place]) 01/28/2017  . Acute on chronic systolic heart failure, NYHA class 3 ([place]) 01/28/2017  . Axillary mass, left 10/17/2016    . Boil 10/17/2016  . Genetic testing 08/15/2016  . Family history of breast cancer   . Family history of uterine cancer   . Family history of colon cancer   . Malignant neoplasm of lower-inner quadrant of left female breast ([place]) 06/26/2016  . Small bowel polyp   . CHF (congestive heart failure) ([place]) 12/21/2015  . Acute blood loss anemia   . Atrial fibrillation ([place]) 12/11/2015  . Benign neoplasm of descending colon   . Upper GI bleeding   . Iron deficiency anemia due to chronic blood loss   . Acute upper GI bleeding 11/14/2015  . Anemia 11/14/2015  . Absolute anemia   . Symptomatic anemia   . Bleeding gastrointestinal   . PAF (paroxysmal atrial fibrillation) ([place]) 09/22/2015  . Chest pain at rest 08/19/2015  . Chest pain 08/19/2015  . NSVT (nonsustained ventricular tachycardia) ([place]) 06/30/2015  . Breast cancer of lower-outer quadrant of right female breast ([place]) 07/22/2014  . CAD (coronary artery disease) 02/17/2014  . Type 1 diabetes mellitus with neurological manifestations ([place]) 01/06/2013  . Cardiogenic shock ([place]) 01/31/2012  . Acute respiratory failure with hypoxia ([place]) 01/31/2012  . PALPITATIONS 09/20/2010  . Mixed hypercholesterolemia and hypertriglyceridemia 05/01/2009  . HYPERTENSION, BENIGN 01/05/2009  . FOOT PAIN 11/29/2008  . Sinus tachycardia 10/25/2008  . 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 Obesity 07/24/2007  . Obstructive sleep apnea 07/24/2007  . RESTLESS LEG SYNDROME 07/24/2007    Palliative Care Assessment & Plan   Patient Profile:  61 y.o. 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 She was found to have a potassium level greater than 7.  Assessment: Patient is Sitting in bedside chair at this time she appears happy and at peace with the decision to  discharge home to stay with her sister. She verbalizes that she is ready to be out of the hospital setting. The patient's sister verbalized that she and her sister have been through a lot together and that "we will jump through this door together as well. 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  Recommendations/Plan: Case Management consult placed for home hospice and DME acquisition S patient and family would like to discharge tomorrow.   Goals of Care and Additional Recommendations:  Limitations on Scope of Treatment: DNR Comfort Care at this time. The patient does not desire to discharge home with her milrinone please see Dr. Clayborne Dana notes for further.   Code Status:DNR   Advance Directive Documentation     Most Recent Value  Type of Advance Directive  Living will  Pre-existing out of facility DNR order (yellow  form or pink MOST form)  -  "MOST" Form in Place?  -       Prognosis:   < 6 months  Discharge Planning:  Discharge to live with her sister with hospice and home.   Thank you for allowing the Palliative Medicine Team to assist in the care of this patient.   Total Time 13min Prolonged Time Billed  no       Greater than 50%  of this time was spent counseling and coordinating care related to the above assessment and plan.    Please contact Palliative Medicine Team phone at [phone] for questions and concerns.   Vinie Sill, NP Palliative Medicine Team Pager # [phone] (M-F 8a-5p) Team Phone # [phone] (Nights/Weekends)

## 2017-03-12 NOTE — Telephone Encounter (Signed)
VO given to [facility] for Dr. Haroldine Laws to be patient's attending.  Renee Pain, RN

## 2017-03-12 NOTE — Progress Notes (Signed)
Palliative Medicine RN Note: Rec'd call from pt's sister Ellard Artis. She reports that she has made room for equipment and volunteers that she wants to use [facility] for hospice services. Called referral in to [facility]; rx has been placed already for home hospice. Spoke with RNCM Nira Conn, who is aware of family's desire.  Returned call to [facility] to let her know that [facility] will be in contact with her. She reports that in the last few minutes, the pt has called her and begged to go home tonight. I explained that the decision/OK for that is outside of what I can do, and she verbalized understanding. I discussed pt with PMT NPs Crystal and Elmo Putt; they will discuss planning with appropriate physicians/family.  Marjie Skiff Georgian Mcclory, RN, BSN, [facility] 03/12/2017 1:52 PM Cell [phone] 8:00-4:00 Monday-Friday Office [phone]

## 2017-03-12 NOTE — Progress Notes (Addendum)
    Patient is a heart failure patient who is a DNR with plans to go home on hospice care. I was paged by Charlynn Court, RN about acute decline in her statu.   She developed acute difficulty breathing, became diaphoretic, lethargic, slurred speech and slightly altered. Her respiratory status declined to 1-02 RR, systolic 93 BP. She was placed on a nonrebreathing nasal mask and rapid response called for rapid assistance.  Patient currently awake and alert, reports feeling very poorly. She is barely audible.  I spoke directly with Dr. Haroldine Laws who asks I order 120 mg IV lasix. He will talk to the family members about Morphine drip to help patient. Will await his conversation with family for further instructions and addend note as needed.   Dr. Haroldine Laws requests to have ICD turned off ([employer]), no other orders at this time. I told RN about this directly and placed order.

## 2017-03-12 NOTE — Progress Notes (Signed)
Patient ID: [name], female   DOB: [birth year], 61 y.o.   MRN: [id number]     Advanced Heart Failure Rounding Note  PCP:  Primary Cardiologist: Dr Haroldine Laws  Subjective:    Admitted with A/C Systolic and acute hypoxic respiratory failure in the setting of medication errors at home. K on admit > 7. Placed on short term bipap and weaned to 6 liters of oxygen.   Yesterday tolvaptan added. Sodium up from 120>129.   Palliative Care met with family and she wants to go home with hospice off milrinone.   Today she is not short of breath.Complains of fatigue.    Objective:   Weight Range: 213 lb 8 oz (96.8 kg) Body mass index is 35.53 kg/m.   Vital Signs:   Temp:  [97.1 F (36.2 C)-98.1 F (36.7 C)] 97.2 F (36.2 C) (08/08 0600) Pulse Rate:  [68-101] 99 (08/08 0800) Resp:  [14-24] 18 (08/08 0800) BP: (81-127)/(44-73) 98/70 (08/08 0800) SpO2:  [88 %-100 %] 97 % (08/08 0800) Weight:  [213 lb 8 oz (96.8 kg)] 213 lb 8 oz (96.8 kg) (08/08 0350) Last BM Date: 03/12/17  Weight change: Filed Weights   03/11/17 0300 03/11/17 0328 03/12/17 0350  Weight: 213 lb 1.6 oz (96.7 kg) 213 lb 1.6 oz (96.7 kg) 213 lb 8 oz (96.8 kg)    Intake/Output:   Intake/Output Summary (Last 24 hours) at 03/12/17 0851 Last data filed at 03/12/17 0800  Gross per 24 hour  Intake          1269.72 ml  Output             2025 ml  Net          -755.28 ml      Physical Exam  CVP 11 General:  Chronically ill. Sitting in the chair. No resp difficulty HEENT: normal Neck: supple. no JVD. Carotids 2+ bilat; no bruits. No lymphadenopathy or thryomegaly appreciated. Cor: PMI nondisplaced. Irregular rate & rhythm. No rubs, gallops or murmurs.R upper chest tunneled cath.  Lungs: clear Abdomen: soft, nontender, nondistended. No hepatosplenomegaly. No bruits or masses. Good bowel sounds. Extremities: no cyanosis, clubbing, rash, edema Neuro: alert & orientedx3, cranial nerves grossly intact. moves all 4  extremities w/o difficulty. Affect pleasant GU: foley yellow urine   A fib 90-100s personally reviewed.   EKG    8/4: Regular rhythm, think may be atypical flutter (doubt NSR).   Labs    CBC  Recent Labs  03/11/17 0404 03/12/17 0500  WBC 5.8 7.3  HGB 12.9 13.8  HCT 40.1 40.7  MCV 89.9 87.3  PLT 281 785   Basic Metabolic Panel  Recent Labs  03/10/17 1040  03/11/17 1538 03/12/17 0500  NA  --   < > 122* 129*  K  --   < > 4.2 3.5  CL  --   < > 85* 88*  CO2  --   < > 28 30  GLUCOSE  --   < > 272* 154*  BUN  --   < > 58* 63*  CREATININE  --   < > 1.61* 1.59*  CALCIUM  --   < > 9.2 9.3  MG 1.6*  --  2.4  --   < > = values in this interval not displayed. Liver Function Tests No results for input(s): AST, ALT, ALKPHOS, BILITOT, PROT, ALBUMIN in the last 72 hours. No results for input(s): LIPASE, AMYLASE in the last 72 hours. Cardiac Enzymes No results for  input(s): CKTOTAL, CKMB, CKMBINDEX, TROPONINI in the last 72 hours.  BNP: BNP (last 3 results)  Recent Labs  02/16/17 2227 02/18/17 1229 03/06/17 1156  BNP 658.4* 668.5* 1,084.8*    ProBNP (last 3 results) No results for input(s): PROBNP in the last 8760 hours.   D-Dimer No results for input(s): DDIMER in the last 72 hours. Hemoglobin A1C No results for input(s): HGBA1C in the last 72 hours. Fasting Lipid Panel No results for input(s): CHOL, HDL, LDLCALC, TRIG, CHOLHDL, LDLDIRECT in the last 72 hours. Thyroid Function Tests No results for input(s): TSH, T4TOTAL, T3FREE, THYROIDAB in the last 72 hours.  Invalid input(s): FREET3  Other results:   Imaging    No results found.   Medications:     Scheduled Medications: . amiodarone  200 mg Oral Daily  . amoxicillin-clavulanate  1 tablet Oral Q12H  . apixaban  5 mg Oral BID  . busPIRone  30 mg Oral BID  . calcium-vitamin D  1 tablet Oral BID  . Chlorhexidine Gluconate Cloth  6 each Topical Daily  . cholecalciferol  2,000 Units Oral Daily    . digoxin  0.0625 mg Oral Daily  . docusate sodium  300 mg Oral QHS  . DULoxetine  30 mg Oral Daily  . insulin aspart  0-20 Units Subcutaneous TID WC  . insulin aspart  0-5 Units Subcutaneous QHS  . insulin aspart  4 Units Subcutaneous TID WC  . insulin glargine  15 Units Subcutaneous QHS  . levothyroxine  75 mcg Oral QAC breakfast  . mouth rinse  15 mL Mouth Rinse BID  . multivitamin with minerals  1 tablet Oral Daily  . omega-3 acid ethyl esters  1 g Oral BID  . pantoprazole  40 mg Oral BID  . polyethylene glycol  17 g Oral Daily  . potassium chloride  40 mEq Oral BID  . pyridOXINE  100 mg Oral QPC lunch  . rOPINIRole  1 mg Oral QHS,MR X 1  . rosuvastatin  10 mg Oral QHS  . sodium chloride flush  10-40 mL Intracatheter Q12H  . sodium chloride flush  3 mL Intravenous Q12H  . tiotropium  18 mcg Inhalation Daily  . vitamin C  500 mg Oral QPC lunch  . vitamin E  400 Units Oral QPC lunch    Infusions: . sodium chloride    . furosemide (LASIX) infusion 20 mg/hr (03/12/17 0800)  . milrinone 0.5 mcg/kg/min (03/12/17 0800)    PRN Medications: sodium chloride, acetaminophen, albuterol, ALPRAZolam, alum & mag hydroxide-simeth, hydrOXYzine, ondansetron (ZOFRAN) IV, polyvinyl alcohol, sodium chloride flush, sodium chloride flush    Patient Profile   Ms Vanlue is 61 year old with NICM, chronic systolic heart on  Milrinone 0.5 mcg, DMII, CKD, and h/o bilateral breast cancer admitted with marked volume overload, acute respiratory failure, and hyperkalemia.    Assessment/Plan   1. End StageAcute on chronic systolic CHF: Nonischemic cardiomyopathy.  Echo with EF 15%, diffuse hypokinesis.  On home milrinone when she was admitted.  After discussion with Palliative Care discussion she would like to stop milrinone and move to Hospice.  Volume status improved. Stop IV lasix and transition to torsemide 60 mg twice a day.  Stop all nonessential medications.   Renal function stable.  Hold  off on spironolactone for now give presentation with hyperkalemia.   - Back on digoxin 0.0625.  - Suspect LVAD will be difficult here given total picture.  2. Acute Respiratory Failure: Due to CHF.  Now  back on nasal cannula.  3. Hyperkalemia: K 7.4.on admit.  Given insulin, calcium, and kayexalate.  Resolved.  4. AKI on CKD: creatinine elevated 2.4 on admit. Todays creatinine 1.59.  Cardiorenal.  5. Chronic A fib: Failed DC-CV last admit.  Stop amio and apixaban with transition to hospice.  6. DMII: SSI.  7. H/O Bilateral Breast Cancer: Recent PET scan with hypermetabolic lymphadenopathy. Reactive (CHF) vs recurrent malignancy. Dr Haroldine Laws and Dr Darcey Nora concerned about recurrence.  8. ?Abscess left groin: Afebrile, pending culture data (re-incubate). Multiple species. Discussed ID  Pharm D. Start antibiotics. Add augmentin 500 mg twice a day For 7 days.  9. Hyponatremia - sodium up from 120>129. Improved after tolvaptan.   10. DNR  She was discussed at [facility] and she was not deemed a candidate for LVAD due to possible recurrent cancer.   After extensive conversation with Palliative Care and Dr Haroldine Laws she is requesting discharge to home tomorrow with [facility]. We will leave foley and tunneled PICC. Tunneled PICC will be needed in the event she decompensates and need morphine in the home.   Palliative Care appreciated. Anticipate D/C home tomorrow. Move to 6N today.    Length of Stay: [place], NP  03/12/2017, 8:51 AM  Advanced Heart Failure Team Pager [phone] (M-F; 7a - 4p)  Please contact [facility] for night-coverage after hours (4p -7a ) and weekends on [email or web address]  Patient seen and examined with Darrick Grinder, NP. We discussed all aspects of the encounter. I agree with the assessment and plan as stated above.   Long discussion with patient and her sister with Palliative Care/Hospice tem present. She would like to go home as soon as possible with Hospiice  support off milrinone. Will stop milrinone today as well as other non-essential meds for comfort. Continue to diuresis to get her as dry as possible prior to d/c. We discussed possibility of rapid decompensation off of milrinone and they are aware of this and ready to handle it.   We wil transfer to 6N today with plan for home with Hospice tomorrow as soon as we can arrange everything.   Total time spent 35 minutes. Over half that time spent discussing above.   Glori Bickers, MD  12:16 PM

## 2017-03-12 NOTE — Progress Notes (Signed)
[facility])  Received request from [facility] [place] with Palliative Medicine Team for family interest in [facility] services at home after discharge. Chart and patient information reviewed by [facility] physician. Eligibility confirmed.   Spoke with patient's sister Ellard Artis by phone to confirm interest and explain services. She is familiar with hospice services and verbalized good understanding of information provided. Per Wenda Low, plan is for patient to discharge tomorrow via [facility] to [facility]'s address which is: [address] [place] [address].   DME needs discussed with [facility]. They are requesting hospital bed with split rails and over bet table which have been arranged through [facility]. Wenda Low is the contact person to coordinate delivery. Patient already has walker, BSC, CPAP and oxygen at [name].   Please send signed and completed out of facility DNR home with patient. Please send scripts for any medication patient does not already have including comfort medications.   [facility] hospital liaison will follow up in am.   Please call with hospice related questions.   Thank you,  Erling Conte, LCSW [phone]

## 2017-03-13 MED ORDER — ONDANSETRON HCL 4 MG PO TABS: 4.0000 mg | ORAL_TABLET | Freq: Three times a day (TID) | ORAL | 0 refills | Status: AC | PRN

## 2017-03-13 MED ORDER — MORPHINE SULFATE (CONCENTRATE) 10 MG/0.5ML PO SOLN: 5.0000 mg | ORAL | 0 refills | Status: AC | PRN

## 2017-03-13 MED ORDER — POTASSIUM CHLORIDE CRYS ER 20 MEQ PO TBCR: 40.0000 meq | EXTENDED_RELEASE_TABLET | Freq: Two times a day (BID) | ORAL | 3 refills | Status: AC

## 2017-03-13 NOTE — Progress Notes (Addendum)
RRT team paged, Day RR RN and myself assessed patient. Patient was lethargic, per RN, patient was having trouble breathing, RR slow (8 bpm), AMS, slurred speech    Upon arrival, patient did respond, became more alert, stated her back was hurting but otherwise no complaints. VS were stable, SpO2 on 2L was > 95%. + crackles LLL. Patient is DNR and is being seen by HF team and will be discharged home on hospice.  Earlier patient's HF meds were stopped per patient's request and patient was transitioned to 6N.  Patient did become nauseated, Zofran was given and once nausea resolved, patient was moved back to bed and placed on CPAP for comfort.  Primary RN spoke to CARDS APP and family, updated them on patient's status.    Interventions: - Zofran and CPAP   NOTE: I saw the patient and RN at 0400 (8/9) for reassessment, patient resting comfortably, per RN, patient will discharged home at 0900 this morning with home hospice.

## 2017-03-13 NOTE — Discharge Planning (Signed)
Patient IV removed.  PICC and Foley remaining intact for DC to home with palliative care (per orders).  Zofran given prior to DC and all morning meds per patient request.  Informed of suggested FU appt and appt set.  Scripts given. RN assessment and VS revealed stability for DC.  [facility] transportation notified to transport home per family request. - waiting on arrival.

## 2017-03-13 NOTE — Progress Notes (Signed)
[facility]: RN note  Spoke with [facility], DME is scheduled to be delivered today between 12:00-4:00PM.  Nira Conn, [place] notified.    Farrel Gordon, [facility] Liaison [phone]   All hospital liaison are on [place].

## 2017-03-13 NOTE — Progress Notes (Signed)
[facility]: RN note   Spoke with [place], RNCM.  Patient has all equipment needed in order to go home. DNR needs to be signed. Then [facility] will be called to transport home.  [facility] Liaison [phone]   All hospital liaisons are now on [place].

## 2017-03-13 NOTE — Discharge Planning (Signed)
Dr returned call and stated she is currently working with other patients, but hopes to be able round about 0900 to discharge patient.  Patient and family informed .

## 2017-03-13 NOTE — Discharge Planning (Signed)
[facility] has arrived to transport patient to home.  [place] DNR signed and given for transport.

## 2017-03-13 NOTE — Care Management Important Message (Signed)
Important Message  Patient Details  Name: [name] MRN: [id number] Date of Birth: September 11, 1955   Medicare Important Message Given:  Yes    Orbie Pyo 03/13/2017, 12:48 PM

## 2017-03-13 NOTE — Care Management Note (Addendum)
Case Management Note  Patient Details  Name: SYNDEY JASKOLSKI MRN: [id number] Date of Birth: 1956/06/20  Subjective/Objective:                    Action/Plan:  DNR form signed. [facility] called. Bedside nurse aware. Confirmed with patient's sister Ellard Artis [phone] patient discharging to her home [address], [place], [place] [address]. DME is at home already. Ms Wynetta Emery requesting [facility]. Paperwork completed with PT[facility]'s number . Paged [place] NP to sign DNR form in shadow chart. Once DNR form signed [facility] can be called. Bedside nurse Rodman Key aware and will call PT[facility].  Expected Discharge Date:  03/12/17               Expected Discharge Plan:  Home w Hospice Care  In-House Referral:     Discharge planning Services  CM Consult  Post Acute Care Choice:    Choice offered to:  Patient, Sibling  DME Arranged:  Oxygen DME Agency:  [place]:    [facility] Agency:  [facility]  Status of Service:  Completed, signed off  If discussed at [place] of Stay Meetings, dates discussed:    Additional Comments:  Marilu Favre, RN 03/13/2017, 10:24 AM

## 2017-03-13 NOTE — Discharge Planning (Signed)
Dr. Informed that family has been  Told they could be discharged to home by 0800 - yet no orders. Dr. Gabriel Carina states they will notify on-call to assist. - waiting on DC orders.

## 2017-03-14 ENCOUNTER — Other Ambulatory Visit (HOSPITAL_COMMUNITY): Payer: Self-pay | Admitting: Internal Medicine

## 2017-03-14 ENCOUNTER — Other Ambulatory Visit: Payer: Self-pay | Admitting: *Deleted

## 2017-03-14 ENCOUNTER — Encounter: Payer: Self-pay | Admitting: *Deleted

## 2017-03-14 NOTE — Patient Outreach (Signed)
[facility]) Care Management  03/14/2017  [name] 02-27-1956 [id number]   Patient discharged from [facility] on 03/13/17 to home with hospice care.  Plan Will close case per protocol.  Will send CMA in basket message for case closure Will send MD case closure letter.  Joylene Draft, RN, [place] Management Coordinator  [phone]- Mobile [phone]- Toll Free Main Office

## 2017-03-17 ENCOUNTER — Encounter (HOSPITAL_COMMUNITY): Payer: Medicare Other

## 2017-03-18 DIAGNOSIS — G4733 Obstructive sleep apnea (adult) (pediatric): Secondary | ICD-10-CM | POA: Diagnosis not present

## 2017-03-19 DIAGNOSIS — G4733 Obstructive sleep apnea (adult) (pediatric): Secondary | ICD-10-CM | POA: Diagnosis not present

## 2017-03-20 ENCOUNTER — Encounter (HOSPITAL_COMMUNITY): Payer: Self-pay | Admitting: *Deleted

## 2017-03-20 NOTE — Progress Notes (Addendum)
Received fax copy of pt's DC, completed and signed by Dr Haroldine Laws, faxed back to [facility] today.   Received Original copy of pt's DC, completed/signed by Dr. Haroldine Laws, mailed on 03/27/2017.

## 2017-03-25 ENCOUNTER — Ambulatory Visit: Payer: Medicare Other | Admitting: Pulmonary Disease

## 2017-03-27 ENCOUNTER — Encounter (HOSPITAL_COMMUNITY): Payer: Medicare Other

## 2017-04-05 DEATH — deceased

## 2017-04-07 ENCOUNTER — Other Ambulatory Visit (HOSPITAL_COMMUNITY): Payer: Self-pay | Admitting: Internal Medicine

## 2017-04-18 ENCOUNTER — Encounter: Payer: Medicare Other | Admitting: Adult Health

## 2018-02-17 IMAGING — CR DG CHEST 2V
2 series · 2 of 2 positions shown · non-contrast
Comparison: 02/16/2017

CLINICAL DATA: Shortness of Breath

EXAM:
CHEST  2 VIEW

[chest lat]
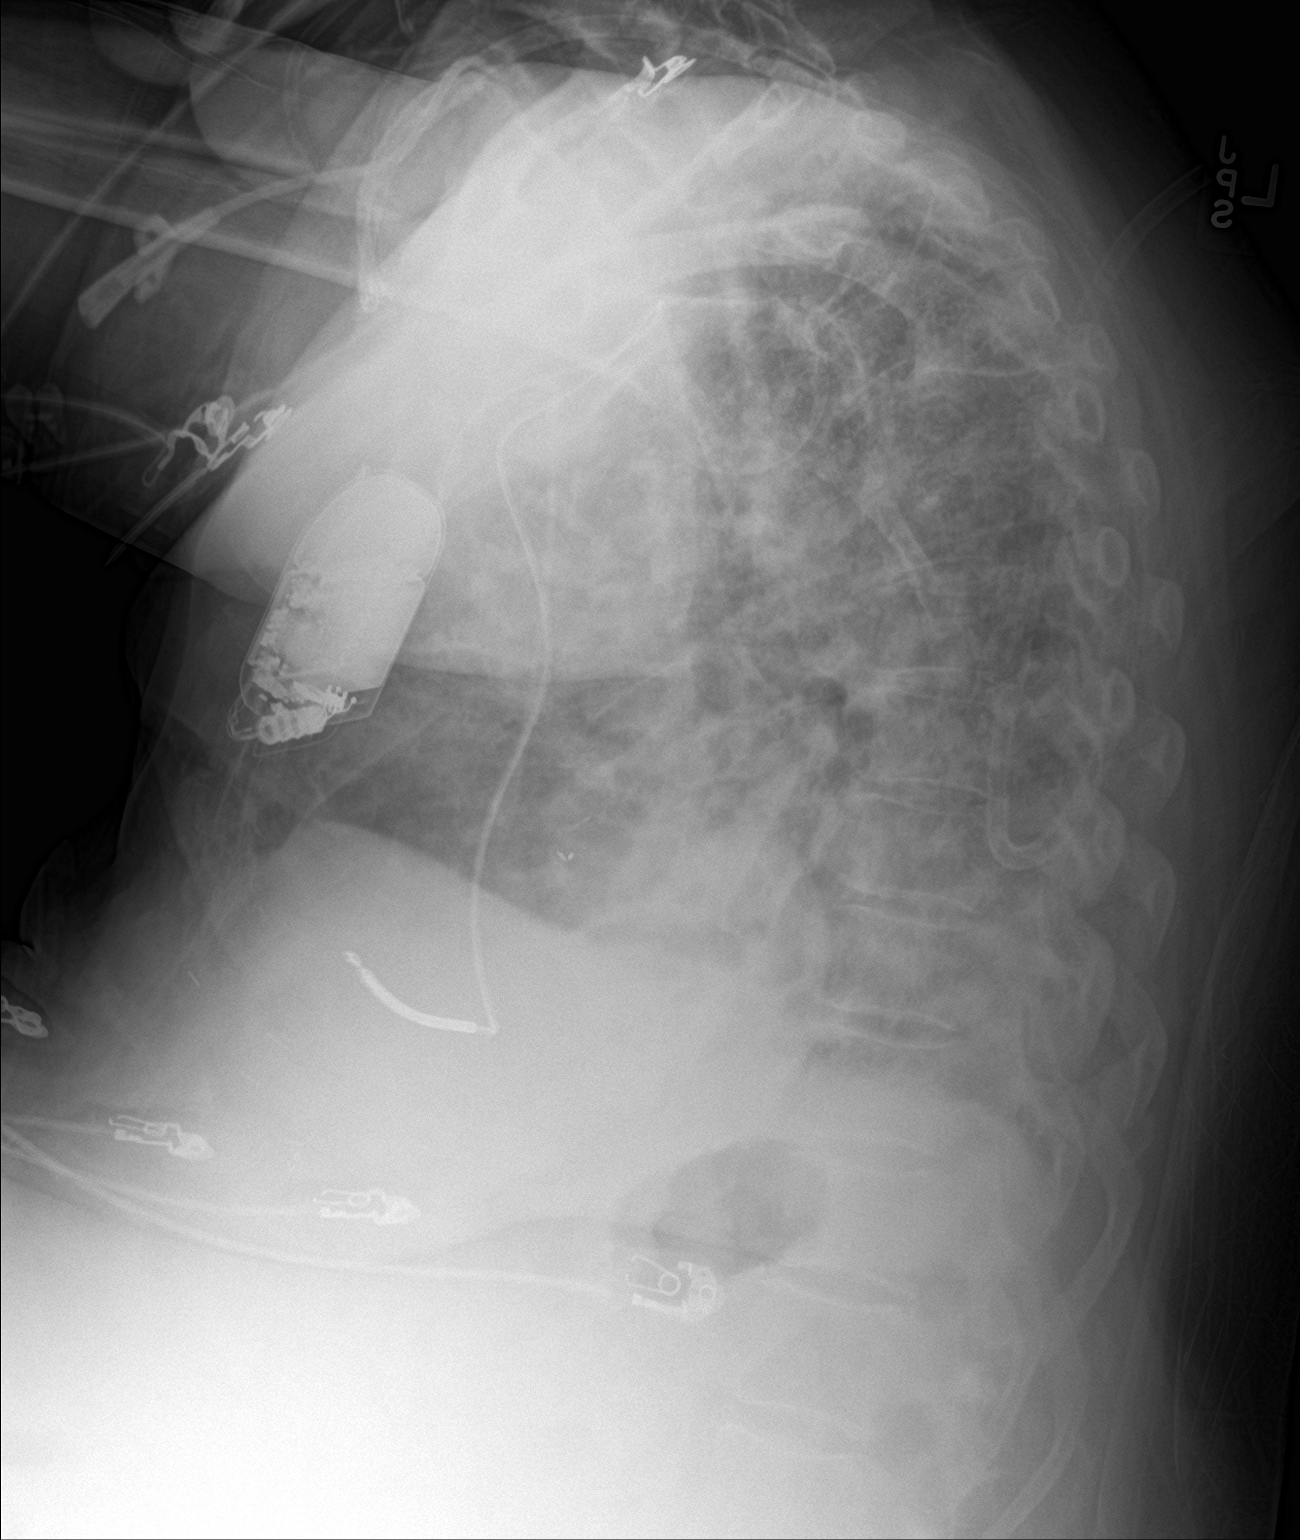

[chest ap]
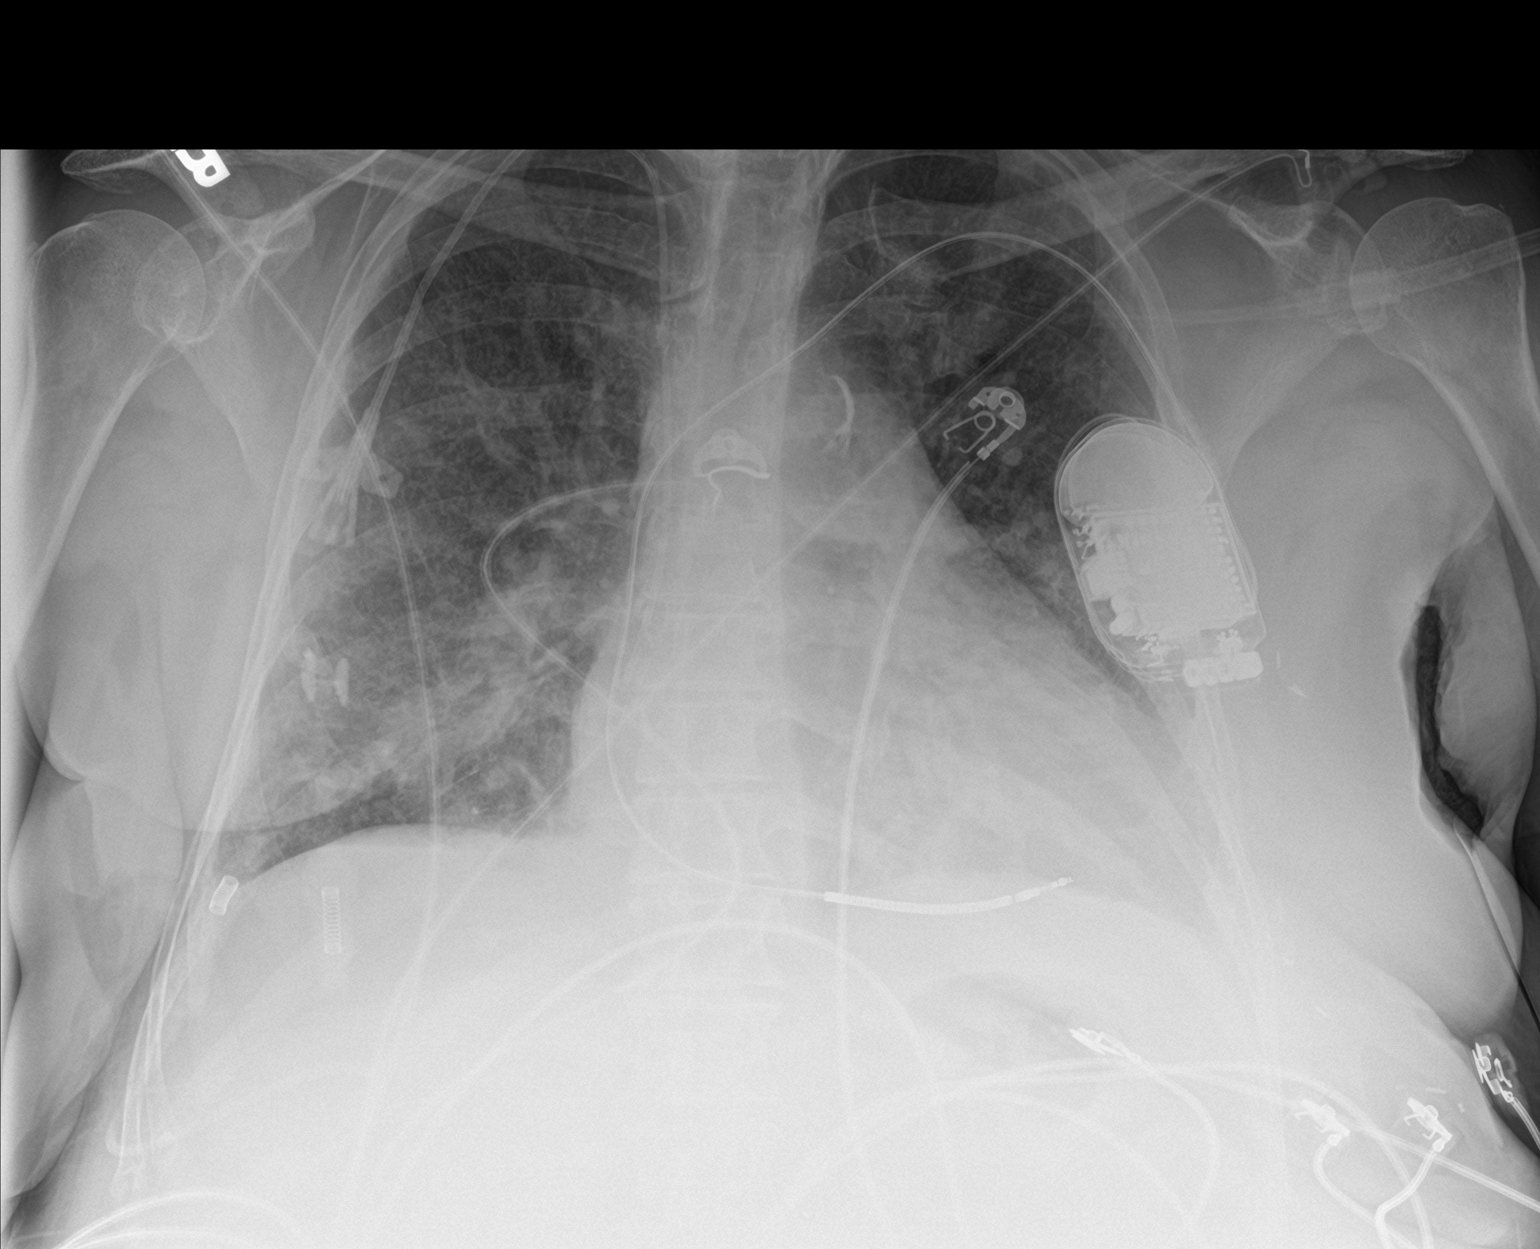

[2 of 2 positions shown; findings below may reference images not displayed]

FINDINGS: Right internal jugular PICC line and left AICD remain in place,
unchanged. Cardiomegaly. Vascular congestion and diffuse
interstitial prominence compatible with interstitial edema. More
confluent opacity noted in the infrahilar regions bilaterally,
likely edema although infection cannot be excluded. No effusions. No
acute bony abnormality.
IMPRESSION: Mild CHF. More focal infrahilar opacities bilaterally could reflect
edema or infection.

## 2018-02-23 IMAGING — DX DG CHEST 1V PORT
1 series · 1 of 1 positions shown · non-contrast
Comparison: Chest x-ray 02/11/2017.

CLINICAL DATA: 60-year-old female with history of mid chest
pressure and shortness of breath tonight. History of right-sided
breast cancer diagnosed in 0779 treated with chemotherapy and
radiation therapy.

EXAM:
PORTABLE CHEST 1 VIEW

[chest ap]
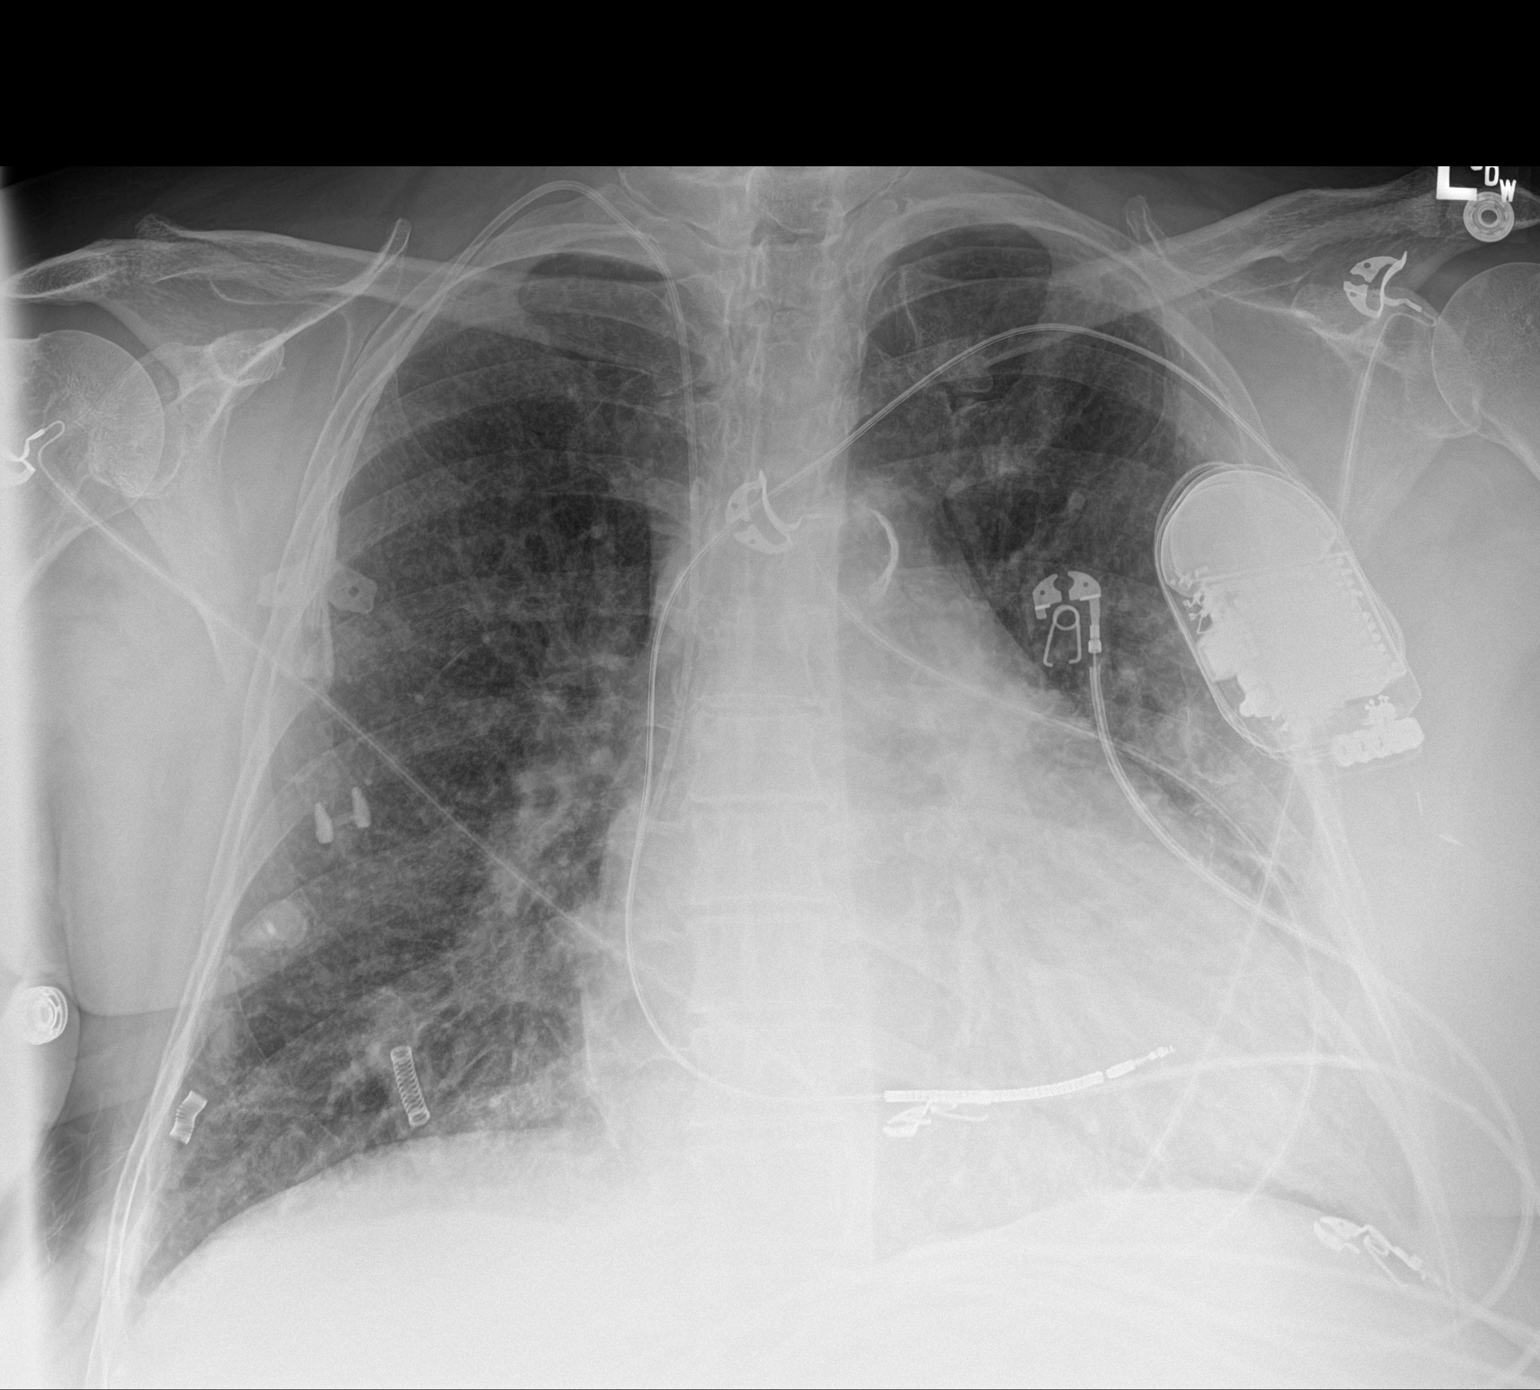

[1 of 1 positions shown; findings below may reference images not displayed]

FINDINGS: There is a right-sided internal jugular central venous catheter with
tip terminating in the distal superior vena cava. Left-sided
pacemaker/AICD with lead tip projecting over the expected location
of the right ventricular apex. Lung volumes are normal. Diffuse
peribronchial cuffing and interstitial prominence, similar to prior
studies. No confluent consolidative airspace disease. No pleural
effusions. Cephalization of the pulmonary vasculature. Mild
cardiomegaly. Upper mediastinal contours are within normal limits.
Aortic atherosclerosis.
IMPRESSION: 1. The appearance the chest again suggests mild congestive heart
failure. The possibility of concurrent bronchitis could also be
considered given the extensive peribronchial cuffing.
2. Aortic atherosclerosis.
